# Patient Record
Sex: Male | Born: 1965
Health system: Southern US, Community
[De-identification: ages and names within clinical notes are randomized; demographics above are authoritative.]

## PROBLEM LIST (undated history)

## (undated) DIAGNOSIS — G43909 Migraine, unspecified, not intractable, without status migrainosus: Secondary | ICD-10-CM

## (undated) DIAGNOSIS — M419 Scoliosis, unspecified: Secondary | ICD-10-CM

## (undated) DIAGNOSIS — K861 Other chronic pancreatitis: Secondary | ICD-10-CM

## (undated) DIAGNOSIS — C801 Malignant (primary) neoplasm, unspecified: Secondary | ICD-10-CM

## (undated) DIAGNOSIS — D137 Benign neoplasm of endocrine pancreas: Secondary | ICD-10-CM

## (undated) DIAGNOSIS — L97502 Non-pressure chronic ulcer of other part of unspecified foot with fat layer exposed: Secondary | ICD-10-CM

## (undated) DIAGNOSIS — J189 Pneumonia, unspecified organism: Secondary | ICD-10-CM

## (undated) DIAGNOSIS — Q453 Other congenital malformations of pancreas and pancreatic duct: Secondary | ICD-10-CM

## (undated) DIAGNOSIS — I10 Essential (primary) hypertension: Secondary | ICD-10-CM

## (undated) HISTORY — PX: KNEE ARTHROSCOPY: SHX127

## (undated) HISTORY — DX: Non-pressure chronic ulcer of other part of unspecified foot with fat layer exposed: L97.502

## (undated) HISTORY — PX: BACK SURGERY: SHX140

## (undated) HISTORY — PX: FRACTURE SURGERY: SHX138

## (undated) HISTORY — DX: Essential (primary) hypertension: I10

---

## 1989-05-30 HISTORY — PX: LUMBAR DISC SURGERY: SHX700

## 1993-09-29 HISTORY — PX: FINGER FRACTURE SURGERY: SHX638

## 2002-12-14 ENCOUNTER — Emergency Department (HOSPITAL_COMMUNITY): Admission: EM | Admit: 2002-12-14 | Discharge: 2002-12-14 | Payer: Self-pay

## 2003-12-25 ENCOUNTER — Emergency Department (HOSPITAL_COMMUNITY): Admission: AC | Admit: 2003-12-25 | Discharge: 2003-12-25 | Payer: Self-pay

## 2004-01-02 ENCOUNTER — Emergency Department (HOSPITAL_COMMUNITY): Admission: EM | Admit: 2004-01-02 | Discharge: 2004-01-02 | Payer: Self-pay | Admitting: Family Medicine

## 2011-06-08 ENCOUNTER — Emergency Department (HOSPITAL_COMMUNITY)
Admission: EM | Admit: 2011-06-08 | Discharge: 2011-06-08 | Discharge status: Against Medical Advice | Payer: Self-pay | Attending: Emergency Medicine | Admitting: Emergency Medicine

## 2011-06-08 DIAGNOSIS — IMO0002 Reserved for concepts with insufficient information to code with codable children: Secondary | ICD-10-CM | POA: Insufficient documentation

## 2011-06-08 DIAGNOSIS — R221 Localized swelling, mass and lump, neck: Secondary | ICD-10-CM | POA: Insufficient documentation

## 2011-06-08 DIAGNOSIS — R220 Localized swelling, mass and lump, head: Secondary | ICD-10-CM | POA: Insufficient documentation

## 2011-06-08 DIAGNOSIS — H5789 Other specified disorders of eye and adnexa: Secondary | ICD-10-CM | POA: Insufficient documentation

## 2011-06-09 ENCOUNTER — Emergency Department (HOSPITAL_COMMUNITY)
Admission: EM | Admit: 2011-06-09 | Discharge: 2011-06-09 | Disposition: A | Discharge status: Home / Self Care | Payer: Self-pay | Attending: Emergency Medicine | Admitting: Emergency Medicine

## 2011-06-09 DIAGNOSIS — H02409 Unspecified ptosis of unspecified eyelid: Secondary | ICD-10-CM | POA: Insufficient documentation

## 2013-09-18 ENCOUNTER — Encounter (HOSPITAL_COMMUNITY): Payer: Self-pay | Admitting: Emergency Medicine

## 2013-09-18 ENCOUNTER — Emergency Department (HOSPITAL_COMMUNITY): Payer: Self-pay

## 2013-09-18 ENCOUNTER — Emergency Department (HOSPITAL_COMMUNITY)
Admission: EM | Admit: 2013-09-18 | Discharge: 2013-09-18 | Disposition: A | Discharge status: Home / Self Care | Payer: Self-pay | Attending: Emergency Medicine | Admitting: Emergency Medicine

## 2013-09-18 DIAGNOSIS — R1032 Left lower quadrant pain: Secondary | ICD-10-CM | POA: Insufficient documentation

## 2013-09-18 DIAGNOSIS — R109 Unspecified abdominal pain: Secondary | ICD-10-CM

## 2013-09-18 DIAGNOSIS — Z8739 Personal history of other diseases of the musculoskeletal system and connective tissue: Secondary | ICD-10-CM | POA: Insufficient documentation

## 2013-09-18 DIAGNOSIS — K5900 Constipation, unspecified: Secondary | ICD-10-CM | POA: Insufficient documentation

## 2013-09-18 DIAGNOSIS — N400 Enlarged prostate without lower urinary tract symptoms: Secondary | ICD-10-CM | POA: Insufficient documentation

## 2013-09-18 DIAGNOSIS — R1904 Left lower quadrant abdominal swelling, mass and lump: Secondary | ICD-10-CM | POA: Insufficient documentation

## 2013-09-18 DIAGNOSIS — F17200 Nicotine dependence, unspecified, uncomplicated: Secondary | ICD-10-CM | POA: Insufficient documentation

## 2013-09-18 DIAGNOSIS — R1900 Intra-abdominal and pelvic swelling, mass and lump, unspecified site: Secondary | ICD-10-CM

## 2013-09-18 HISTORY — DX: Scoliosis, unspecified: M41.9

## 2013-09-18 LAB — CBC WITH DIFFERENTIAL/PLATELET
Basophils Absolute: 0 10*3/uL (ref 0.0–0.1)
Basophils Relative: 0 % (ref 0–1)
Eosinophils Absolute: 0 10*3/uL (ref 0.0–0.7)
Eosinophils Relative: 0 % (ref 0–5)
HCT: 51.2 % (ref 39.0–52.0)
Hemoglobin: 17.7 g/dL — ABNORMAL HIGH (ref 13.0–17.0)
Lymphocytes Relative: 21 % (ref 12–46)
Lymphs Abs: 2.5 10*3/uL (ref 0.7–4.0)
MCH: 30.6 pg (ref 26.0–34.0)
MCHC: 34.6 g/dL (ref 30.0–36.0)
MCV: 88.4 fL (ref 78.0–100.0)
Monocytes Absolute: 0.6 10*3/uL (ref 0.1–1.0)
Monocytes Relative: 5 % (ref 3–12)
Neutro Abs: 9 10*3/uL — ABNORMAL HIGH (ref 1.7–7.7)
Neutrophils Relative %: 74 % (ref 43–77)
Platelets: 232 10*3/uL (ref 150–400)
RBC: 5.79 MIL/uL (ref 4.22–5.81)
RDW: 13.8 % (ref 11.5–15.5)
WBC: 12.2 10*3/uL — ABNORMAL HIGH (ref 4.0–10.5)

## 2013-09-18 LAB — COMPREHENSIVE METABOLIC PANEL
ALT: 14 U/L (ref 0–53)
AST: 21 U/L (ref 0–37)
Albumin: 4.5 g/dL (ref 3.5–5.2)
Alkaline Phosphatase: 119 U/L — ABNORMAL HIGH (ref 39–117)
BUN: 8 mg/dL (ref 6–23)
CO2: 22 mEq/L (ref 19–32)
Calcium: 9.7 mg/dL (ref 8.4–10.5)
Chloride: 100 mEq/L (ref 96–112)
Creatinine, Ser: 0.93 mg/dL (ref 0.50–1.35)
GFR calc Af Amer: >90 mL/min (ref >90)
GFR calc non Af Amer: >90 mL/min (ref >90)
Glucose, Bld: 89 mg/dL (ref 70–99)
Potassium: 4.2 mEq/L (ref 3.5–5.1)
Sodium: 136 mEq/L (ref 135–145)
Total Bilirubin: 0.4 mg/dL (ref 0.3–1.2)
Total Protein: 8 g/dL (ref 6.0–8.3)

## 2013-09-18 LAB — URINALYSIS, ROUTINE W REFLEX MICROSCOPIC
Bilirubin Urine: NEGATIVE
Glucose, UA: NEGATIVE mg/dL
Hgb urine dipstick: NEGATIVE
Ketones, ur: 15 mg/dL — AB
Leukocytes, UA: NEGATIVE
Nitrite: NEGATIVE
Protein, ur: NEGATIVE mg/dL
Specific Gravity, Urine: 1.014 (ref 1.005–1.030)
Urobilinogen, UA: 0.2 mg/dL (ref 0.0–1.0)
pH: 5 (ref 5.0–8.0)

## 2013-09-18 LAB — LIPASE, BLOOD: Lipase: 25 U/L (ref 11–59)

## 2013-09-18 MED ORDER — OXYCODONE-ACETAMINOPHEN 5-325 MG PO TABS: 1.0000 | ORAL_TABLET | Freq: Four times a day (QID) | ORAL | Status: DC | PRN

## 2013-09-18 MED ORDER — IOHEXOL 300 MG/ML  SOLN
100.0000 mL | Freq: Once | INTRAMUSCULAR | Status: AC | PRN
  Administered 2013-09-18: 100 mL via INTRAVENOUS

## 2013-09-18 NOTE — ED Notes (Signed)
Patient transported to CT 

## 2013-09-18 NOTE — ED Notes (Addendum)
Pt c/o LLQ pain that radiates to his back x 3 months.  He came today b/c the pain is unbearable.  C/o increasing diarrhea (6 x's today).  Denies nausea.  Pt also c/o increasing urinary frequency and weight loss.

## 2013-09-18 NOTE — ED Provider Notes (Signed)
CSN: [id number]     Arrival date & time 09/18/13  1216 History   First MD Initiated Contact with Patient 09/18/13 1459     Chief Complaint  Patient presents with  . Abdominal Pain   (Consider location/radiation/quality/duration/timing/severity/associated sxs/prior Treatment) Patient is a 47 y.o. male presenting with abdominal pain. The history is provided by the patient.  Abdominal Pain Pain location:  LLQ Pain quality: sharp   Pain radiation: lower back. Pain severity:  Moderate Onset quality:  Sudden Duration:  12 weeks Timing:  Intermittent (intermittent throughout the day, having daily pain) Progression:  Worsening Chronicity:  New Context: not diet changes, not eating, not recent travel, not sick contacts and not trauma   Relieved by:  Nothing Worsened by:  Nothing tried Associated symptoms: constipation   Associated symptoms: no cough, no fever, no nausea, no shortness of breath and no vomiting     Past Medical History  Diagnosis Date  . Scoliosis    Past Surgical History  Procedure Laterality Date  . Back surgery     No family history on file. History  Substance Use Topics  . Smoking status: Current Every Day Smoker -- 0.25 packs/day    Types: Cigarettes  . Smokeless tobacco: Not on file  . Alcohol Use: Yes     Comment: occ    Review of Systems  Constitutional: Negative for fever.  Respiratory: Negative for cough and shortness of breath.   Gastrointestinal: Positive for abdominal pain and constipation. Negative for nausea and vomiting.  All other systems reviewed and are negative.    Allergies  Review of patient's allergies indicates no known allergies.  Home Medications   Current Outpatient Rx  Name  Route  Sig  Dispense  Refill  . bismuth subsalicylate (PEPTO BISMOL) 262 MG chewable tablet   Oral   Chew 524 mg by mouth as needed for indigestion or diarrhea or loose stools.          BP 113/72  Pulse 75  Temp(Src) 98.6 F (37 C) (Oral)   Resp 20  Wt 161 lb 3.2 oz (73.12 kg)  SpO2 98% Physical Exam  Nursing note and vitals reviewed. Constitutional: He is oriented to person, place, and time. He appears well-developed and well-nourished. No distress.  HENT:  Head: Normocephalic and atraumatic.  Mouth/Throat: No oropharyngeal exudate.  Eyes: EOM are normal. Pupils are equal, round, and reactive to light.  Neck: Normal range of motion. Neck supple.  Cardiovascular: Normal rate and regular rhythm.  Exam reveals no friction rub.   No murmur heard. Pulmonary/Chest: Effort normal and breath sounds normal. No respiratory distress. He has no wheezes. He has no rales.  Abdominal: He exhibits no distension. There is tenderness (LLQ). There is guarding (LLQ). There is no rebound.  Genitourinary: Penis normal. Prostate is enlarged. Prostate is not tender (no boggyness). Right testis shows no mass, no swelling and no tenderness. Left testis shows no mass, no swelling and no tenderness.  Musculoskeletal: Normal range of motion. He exhibits no edema.  Neurological: He is alert and oriented to person, place, and time.  Skin: He is not diaphoretic.    ED Course  Procedures (including critical care time) Labs Review Labs Reviewed  CBC WITH DIFFERENTIAL - Abnormal; Notable for the following:    WBC 12.2 (*)    Hemoglobin 17.7 (*)    Neutro Abs 9.0 (*)    All other components within normal limits  COMPREHENSIVE METABOLIC PANEL - Abnormal; Notable for the following:  Alkaline Phosphatase 119 (*)    All other components within normal limits  LIPASE, BLOOD  URINALYSIS, ROUTINE W REFLEX MICROSCOPIC   Imaging Review Ct Abdomen Pelvis W Contrast  09/18/2013   CLINICAL DATA:  Left lower quadrant pain.  Diarrhea.  EXAM: CT ABDOMEN AND PELVIS WITH CONTRAST  TECHNIQUE: Multidetector CT imaging of the abdomen and pelvis was performed using the standard protocol following bolus administration of intravenous contrast.  CONTRAST:   OMNIPAQUE IOHEXOL 300 MG/ML  SOLN  COMPARISON:  None.  FINDINGS: Liver normal. Spleen normal. Spleen normal. Noted along the descending portion the duodenum in the region of pancreatic head is a ill-defined 4.6 x 2.1 cm mass, representative image number 18/series 6. This is worrisome for pancreatic carcinoma or a duodenal malignancy. Peptic ulcer disease with swelling could also present in this fashion. Endoscopic evaluation is suggested. Portal vein and splenic vein appear patent. Celiac, SMA, and IMA are patent. Mesenteric veins are patent. Hepatic veins are patent. No definite periportal or peripancreatic adenopathy noted.  Adrenals normal. Simple renal cyst . No hydronephrosis or obstructing ureteral stone. Bladder is nondistended. Phleboliths. Mild prostate enlargement. No free pelvic fluid.  No significant inguinal or retroperitoneal adenopathy. Aorta is widely patent.  There is no evidence of bowel obstruction. No inflammatory changes are in the right or left lower quadrant. What appears to be the appendix is normal. No gastric distention noted. Distal esophagus is unremarkable.  Abdominal wall is intact. No hernia. Heart size normal. No acute bony abnormality identified. Degenerative changes lumbar spine.  IMPRESSION: Large mass involving the duodenum and/or pancreatic head. This is worrisome for primary duodenal or pancreatic malignancy. Endoscopic evaluation is suggest for further evaluation. Endoscopic ultrasound directed biopsy may prove useful.   Electronically Signed   By: Maisie Fus  Register   On: 09/18/2013 16:45    EKG Interpretation   None       MDM   1. Abdominal mass   2. Abdominal pain    89M presents with LLQ pain for 3 months. Daily, sharp, radiating to lower back. No N/V/D. No hematuria, penile pain, testicular pain. No fevers. Does have urinary frequency, stating small amount of urine each time he urinates. No hx of diverticulitis.  AFVSS. On exam, LLQ pain with guarding.  Enlarged, nontender prostate. No prostate bogginess. Normal GU exam, no hernias or testicular pain.  Labs with mild white count. Will CT scan.  CT shows possible pancreatic mass vs PUD. I spoke with GI who stated patient can f/u as outpatient vs inpatient. Patient would like to f/u outpatient. Given instructions to f/u ASAP in case this is pancreatic cancer.   Dagmar Hait, MD 09/19/13 [phone]

## 2013-09-18 NOTE — ED Notes (Signed)
Patient discharged to home with family. NAD.  

## 2013-09-19 ENCOUNTER — Other Ambulatory Visit: Payer: Self-pay | Admitting: Gastroenterology

## 2013-09-20 ENCOUNTER — Encounter (HOSPITAL_COMMUNITY): Payer: Self-pay | Admitting: Emergency Medicine

## 2013-09-20 ENCOUNTER — Inpatient Hospital Stay (HOSPITAL_COMMUNITY)
Admission: EM | Admit: 2013-09-20 | Discharge: 2013-09-22 | DRG: 437 | Disposition: A | Discharge status: Home / Self Care | Payer: Self-pay | Attending: Internal Medicine | Admitting: Internal Medicine

## 2013-09-20 DIAGNOSIS — F17200 Nicotine dependence, unspecified, uncomplicated: Secondary | ICD-10-CM | POA: Diagnosis present

## 2013-09-20 DIAGNOSIS — M4120 Other idiopathic scoliosis, site unspecified: Secondary | ICD-10-CM | POA: Diagnosis present

## 2013-09-20 DIAGNOSIS — R1900 Intra-abdominal and pelvic swelling, mass and lump, unspecified site: Secondary | ICD-10-CM | POA: Diagnosis present

## 2013-09-20 DIAGNOSIS — R109 Unspecified abdominal pain: Secondary | ICD-10-CM | POA: Diagnosis present

## 2013-09-20 DIAGNOSIS — R112 Nausea with vomiting, unspecified: Secondary | ICD-10-CM | POA: Diagnosis present

## 2013-09-20 DIAGNOSIS — C250 Malignant neoplasm of head of pancreas: Principal | ICD-10-CM | POA: Diagnosis present

## 2013-09-20 LAB — CBC WITH DIFFERENTIAL/PLATELET
Basophils Absolute: 0 10*3/uL (ref 0.0–0.1)
Basophils Relative: 1 % (ref 0–1)
Eosinophils Absolute: 0.1 10*3/uL (ref 0.0–0.7)
Eosinophils Relative: 1 % (ref 0–5)
HCT: 48.3 % (ref 39.0–52.0)
Hemoglobin: 16.5 g/dL (ref 13.0–17.0)
Lymphocytes Relative: 30 % (ref 12–46)
Lymphs Abs: 2 10*3/uL (ref 0.7–4.0)
MCH: 30.1 pg (ref 26.0–34.0)
MCHC: 34.2 g/dL (ref 30.0–36.0)
MCV: 88 fL (ref 78.0–100.0)
Monocytes Absolute: 0.5 10*3/uL (ref 0.1–1.0)
Monocytes Relative: 8 % (ref 3–12)
Neutro Abs: 4 10*3/uL (ref 1.7–7.7)
Neutrophils Relative %: 61 % (ref 43–77)
Platelets: 216 10*3/uL (ref 150–400)
RBC: 5.49 MIL/uL (ref 4.22–5.81)
RDW: 13.8 % (ref 11.5–15.5)
WBC: 6.6 10*3/uL (ref 4.0–10.5)

## 2013-09-20 LAB — URINALYSIS, ROUTINE W REFLEX MICROSCOPIC
Bilirubin Urine: NEGATIVE
Glucose, UA: NEGATIVE mg/dL
Hgb urine dipstick: NEGATIVE
Ketones, ur: 15 mg/dL — AB
Leukocytes, UA: NEGATIVE
Nitrite: NEGATIVE
Protein, ur: NEGATIVE mg/dL
Specific Gravity, Urine: 1.02 (ref 1.005–1.030)
Urobilinogen, UA: 1 mg/dL (ref 0.0–1.0)
pH: 5.5 (ref 5.0–8.0)

## 2013-09-20 LAB — COMPREHENSIVE METABOLIC PANEL
ALT: 12 U/L (ref 0–53)
AST: 19 U/L (ref 0–37)
Albumin: 4.1 g/dL (ref 3.5–5.2)
Alkaline Phosphatase: 101 U/L (ref 39–117)
BUN: 5 mg/dL — ABNORMAL LOW (ref 6–23)
CO2: 27 mEq/L (ref 19–32)
Calcium: 9.4 mg/dL (ref 8.4–10.5)
Chloride: 103 mEq/L (ref 96–112)
Creatinine, Ser: 0.89 mg/dL (ref 0.50–1.35)
GFR calc Af Amer: >90 mL/min (ref >90)
GFR calc non Af Amer: >90 mL/min (ref >90)
Glucose, Bld: 96 mg/dL (ref 70–99)
Potassium: 4.6 mEq/L (ref 3.5–5.1)
Sodium: 142 mEq/L (ref 135–145)
Total Bilirubin: 0.3 mg/dL (ref 0.3–1.2)
Total Protein: 7.5 g/dL (ref 6.0–8.3)

## 2013-09-20 LAB — TROPONIN I: Troponin I: <0.3 ng/mL (ref <0.30)

## 2013-09-20 LAB — LIPASE, BLOOD: Lipase: 26 U/L (ref 11–59)

## 2013-09-20 MED ORDER — HYDROMORPHONE HCL PF 1 MG/ML IJ SOLN
1.0000 mg | INTRAMUSCULAR | Status: AC | PRN
  Administered 2013-09-20: 1 mg via INTRAVENOUS
  Filled 2013-09-20: qty 1

## 2013-09-20 MED ORDER — ENOXAPARIN SODIUM 40 MG/0.4ML ~~LOC~~ SOLN
40.0000 mg | SUBCUTANEOUS | Status: DC
  Administered 2013-09-21: 40 mg via SUBCUTANEOUS
  Filled 2013-09-20 (×2): qty 0.4

## 2013-09-20 MED ORDER — ACETAMINOPHEN 650 MG RE SUPP: 650.0000 mg | Freq: Four times a day (QID) | RECTAL | Status: DC | PRN

## 2013-09-20 MED ORDER — ONDANSETRON HCL 4 MG/2ML IJ SOLN
4.0000 mg | Freq: Three times a day (TID) | INTRAMUSCULAR | Status: AC | PRN
  Administered 2013-09-20: 4 mg via INTRAVENOUS
  Filled 2013-09-20: qty 2

## 2013-09-20 MED ORDER — SODIUM CHLORIDE 0.9 % IV BOLUS (SEPSIS)
1000.0000 mL | Freq: Once | INTRAVENOUS | Status: AC
  Administered 2013-09-20: 1000 mL via INTRAVENOUS

## 2013-09-20 MED ORDER — MORPHINE SULFATE 4 MG/ML IJ SOLN
4.0000 mg | Freq: Once | INTRAMUSCULAR | Status: AC
  Administered 2013-09-20: 4 mg via INTRAVENOUS
  Filled 2013-09-20: qty 1

## 2013-09-20 MED ORDER — ONDANSETRON HCL 4 MG/2ML IJ SOLN
4.0000 mg | Freq: Once | INTRAMUSCULAR | Status: AC
  Administered 2013-09-20: 4 mg via INTRAVENOUS
  Filled 2013-09-20: qty 2

## 2013-09-20 MED ORDER — SODIUM CHLORIDE 0.9 % IV SOLN
INTRAVENOUS | Status: DC
  Administered 2013-09-20: 20 mL/h via INTRAVENOUS

## 2013-09-20 MED ORDER — SODIUM CHLORIDE 0.9 % IV SOLN
INTRAVENOUS | Status: DC
  Administered 2013-09-20 – 2013-09-21 (×2): via INTRAVENOUS

## 2013-09-20 MED ORDER — ACETAMINOPHEN 325 MG PO TABS
650.0000 mg | ORAL_TABLET | Freq: Four times a day (QID) | ORAL | Status: DC | PRN
  Administered 2013-09-21 – 2013-09-22 (×2): 650 mg via ORAL
  Filled 2013-09-20 (×2): qty 2

## 2013-09-20 NOTE — ED Notes (Signed)
Attempted to call report. RN will call back. 

## 2013-09-20 NOTE — ED Notes (Signed)
Report called 6N RN

## 2013-09-20 NOTE — H&P (Signed)
[facility] History and Physical  Brent Rogers ZOX:[id number] DOB: 07/05/1966 DOA: 09/20/2013  Referring physician:  PCP: No PCP Per Patient  Specialists:   Chief Complaint: abdominal pain, nausea, vomiting   HPI: Brent Rogers is a 47 y.o. male smoker without significant medical history presented with LLQ pain for 3 months, associated with nausea, vomiting and weight loss > 30lb; patient was seen in ED on 12/12 underwent CT abdomen which showed Large mass involving the duodenum and/or pancreatic head then patient was d/c to f/u with GI; however his symptoms did not improve continued with abdominal pain, nausea vomiting and he came back to ED; Denies fever, no SOB, no chest pain, no focal neuro symptoms   Review of Systems: The patient denies anorexia, fever,  vision loss, decreased hearing, hoarseness, chest pain, syncope, dyspnea on exertion, peripheral edema, balance deficits, hemoptysis, abdominal pain, melena, hematochezia, s hematuria, incontinence, genital sores, muscle weakness, suspicious skin lesions, transient blindness, difficulty walking, depression, abnormal bleeding, enlarged lymph nodes, angioedema, and breast masses.    Past Medical History  Diagnosis Date  . Scoliosis    Past Surgical History  Procedure Laterality Date  . Back surgery     Social History:  reports that he has been smoking Cigarettes.  He has been smoking about 0.25 packs per day. He does not have any smokeless tobacco history on file. He reports that he drinks alcohol. He reports that he does not use illicit drugs. Home;  where does patient live--home, ALF, SNF? and with whom if at home? Yes;  Can patient participate in ADLs?  No Known Allergies  History reviewed. No pertinent family history. h/o breast CA (be sure to complete)  Prior to Admission medications   Medication Sig Start Date End Date Taking? Authorizing Provider  bismuth subsalicylate (PEPTO BISMOL) 262 MG chewable tablet  Chew 524 mg by mouth as needed for indigestion or diarrhea or loose stools.   Yes Historical Provider, MD   Physical Exam: Filed Vitals:   09/20/13 0949  BP: 119/92  Pulse: 94  Temp: 99.2 F (37.3 C)  Resp: 18     General:  alert  Eyes: eom-i, perrla   ENT: no oral ulcers   Neck: supple   Cardiovascular: s1,s2 rrr  Respiratory: CTA BL  Abdomen: soft, nt, nd   Skin: no oral rash   Musculoskeletal: no LE edema   Psychiatric: no hallucinations   Neurologic: CN 2-12 intact   Labs on Admission:  Basic Metabolic Panel:  Recent Labs Lab 09/18/13 1237 09/20/13 1024  NA 136 142  K 4.2 4.6  CL 100 103  CO2 22 27  GLUCOSE 89 96  BUN 8 5*  CREATININE 0.93 0.89  CALCIUM 9.7 9.4   Liver Function Tests:  Recent Labs Lab 09/18/13 1237 09/20/13 1024  AST 21 19  ALT 14 12  ALKPHOS 119* 101  BILITOT 0.4 0.3  PROT 8.0 7.5  ALBUMIN 4.5 4.1    Recent Labs Lab 09/18/13 1237 09/20/13 1024  LIPASE 25 26   No results found for this basename: AMMONIA,  in the last 168 hours CBC:  Recent Labs Lab 09/18/13 1237 09/20/13 1024  WBC 12.2* 6.6  NEUTROABS 9.0* 4.0  HGB 17.7* 16.5  HCT 51.2 48.3  MCV 88.4 88.0  PLT 232 216   Cardiac Enzymes:  Recent Labs Lab 09/20/13 1025  TROPONINI <0.30    BNP (last 3 results) No results found for this basename: PROBNP,  in the last 8760  hours CBG: No results found for this basename: GLUCAP,  in the last 168 hours  Radiological Exams on Admission: Ct Abdomen Pelvis W Contrast  09/18/2013   CLINICAL DATA:  Left lower quadrant pain.  Diarrhea.  EXAM: CT ABDOMEN AND PELVIS WITH CONTRAST  TECHNIQUE: Multidetector CT imaging of the abdomen and pelvis was performed using the standard protocol following bolus administration of intravenous contrast.  CONTRAST:  OMNIPAQUE IOHEXOL 300 MG/ML  SOLN  COMPARISON:  None.  FINDINGS: Liver normal. Spleen normal. Spleen normal. Noted along the descending portion the duodenum in  the region of pancreatic head is a ill-defined 4.6 x 2.1 cm mass, representative image number 18/series 6. This is worrisome for pancreatic carcinoma or a duodenal malignancy. Peptic ulcer disease with swelling could also present in this fashion. Endoscopic evaluation is suggested. Portal vein and splenic vein appear patent. Celiac, SMA, and IMA are patent. Mesenteric veins are patent. Hepatic veins are patent. No definite periportal or peripancreatic adenopathy noted.  Adrenals normal. Simple renal cyst . No hydronephrosis or obstructing ureteral stone. Bladder is nondistended. Phleboliths. Mild prostate enlargement. No free pelvic fluid.  No significant inguinal or retroperitoneal adenopathy. Aorta is widely patent.  There is no evidence of bowel obstruction. No inflammatory changes are in the right or left lower quadrant. What appears to be the appendix is normal. No gastric distention noted. Distal esophagus is unremarkable.  Abdominal wall is intact. No hernia. Heart size normal. No acute bony abnormality identified. Degenerative changes lumbar spine.  IMPRESSION: Large mass involving the duodenum and/or pancreatic head. This is worrisome for primary duodenal or pancreatic malignancy. Endoscopic evaluation is suggest for further evaluation. Endoscopic ultrasound directed biopsy may prove useful.   Electronically Signed   By: Maisie Fus  Register   On: 09/18/2013 16:45    EKG: Independently reviewed. Not done   Assessment/Plan Principal Problem:   Nausea & vomiting Active Problems:   Abdominal mass   Abdominal pain  47 y.o. male smoker without significant medical history presented with LLQ pain for 3 months, associated with nausea, vomiting and weight loss > 30 lb found to have pancreatic mass  1. Abdominal pain, nausea, vomiting likely related to underlying pancreatic mass; exam no s/s of acute abdomen; -IVF, antiemetics, control pain;   2. 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 This is worrisome for primary duodenal or pancreatic malignancy -NPO, c/s GI if could perform EUS biopsy;    GI;  if consultant consulted, please document name and whether formally or informally consulted  Code Status: full (must indicate code status--if unknown or must be presumed, indicate so) Family Communication: d/w patient  (indicate person spoken with, if applicable, with phone number if by telephone) Disposition Plan: home in 24-48 hours  (indicate anticipated LOS)  Time spent: >35 minutes   Esperanza Sheets [facility] Pager [phone]  If 7PM-7AM, please contact night-coverage [email or web address] Password [email or web address] 09/20/2013, 1:17 PM

## 2013-09-20 NOTE — ED Provider Notes (Signed)
CSN: [id number]     Arrival date & time 09/20/13  4098 History   First MD Initiated Contact with Patient 09/20/13 1007     Chief Complaint  Patient presents with  . Abdominal Pain   (Consider location/radiation/quality/duration/timing/severity/associated sxs/prior Treatment) Patient is a 47 y.o. male presenting with abdominal pain. The history is provided by the patient and medical records.  Abdominal Pain  This is a 47 year old male with past medical history significant for scoliosis, presenting to the ED for left lower quadrant and epigastric abdominal pain intermittently for the past 3 months. Patient was seen in the ED over the weekend and diagnosed with pancreatic mass. Patient elected to undergo outpatient workup, has called for an appointment was not available until end of January.  Patient has continued to have daily nausea and vomiting at home despite treatment with Zofran. He has been unable to tolerate minimal amounts of water, but no solid foods. Patient states pain is unchanged, but notes there is some intermittent radiation into the left side of chest.  No associated SOB, palpitations, dizziness, weakness, or diaphoresis.  No prior cardiac hx.  Pt is a daily smoker.  Additionally patient notes a 20-30 pound weight loss over the past 3 months. Prior family history of cancer-- maternal, thinks it was breast CA.  Pt has never had a colonoscopy.  Denies any fevers, sweats, or chills. Admits to frequent urination but denies any dysuria, hematuria, or urethral discharge.  Past Medical History  Diagnosis Date  . Scoliosis    Past Surgical History  Procedure Laterality Date  . Back surgery     History reviewed. No pertinent family history. History  Substance Use Topics  . Smoking status: Current Every Day Smoker -- 0.25 packs/day    Types: Cigarettes  . Smokeless tobacco: Not on file  . Alcohol Use: Yes     Comment: occ    Review of Systems  Gastrointestinal: Positive for  abdominal pain.  All other systems reviewed and are negative.    Allergies  Review of patient's allergies indicates no known allergies.  Home Medications   Current Outpatient Rx  Name  Route  Sig  Dispense  Refill  . bismuth subsalicylate (PEPTO BISMOL) 262 MG chewable tablet   Oral   Chew 524 mg by mouth as needed for indigestion or diarrhea or loose stools.          BP 119/92  Pulse 94  Temp(Src) 99.2 F (37.3 C) (Oral)  Resp 18  SpO2 100%  Physical Exam  Nursing note and vitals reviewed. Constitutional: He is oriented to person, place, and time. He appears well-developed and well-nourished.  HENT:  Head: Normocephalic and atraumatic.  Mouth/Throat: Oropharynx is clear and moist.  Eyes: Conjunctivae and EOM are normal. Pupils are equal, round, and reactive to light.  Neck: Normal range of motion.  Cardiovascular: Normal rate, regular rhythm and normal heart sounds.   Pulmonary/Chest: Effort normal and breath sounds normal.  Abdominal: Soft. Bowel sounds are normal. There is tenderness in the epigastric area and left lower quadrant.  Abdomen soft, nondistended, TTP LLQ and epigastric regions  Musculoskeletal: Normal range of motion.  Neurological: He is alert and oriented to person, place, and time.  Skin: Skin is warm and dry.  Psychiatric: He has a normal mood and affect.    ED Course  Procedures (including critical care time)  Labs Review Labs Reviewed  COMPREHENSIVE METABOLIC PANEL - Abnormal; Notable for the following:    BUN 5 (*)  All other components within normal limits  CBC WITH DIFFERENTIAL  LIPASE, BLOOD  TROPONIN I  URINALYSIS, ROUTINE W REFLEX MICROSCOPIC   Imaging Review Ct Abdomen Pelvis W Contrast  09/18/2013   CLINICAL DATA:  Left lower quadrant pain.  Diarrhea.  EXAM: CT ABDOMEN AND PELVIS WITH CONTRAST  TECHNIQUE: Multidetector CT imaging of the abdomen and pelvis was performed using the standard protocol following bolus  administration of intravenous contrast.  CONTRAST:  OMNIPAQUE IOHEXOL 300 MG/ML  SOLN  COMPARISON:  None.  FINDINGS: Liver normal. Spleen normal. Spleen normal. Noted along the descending portion the duodenum in the region of pancreatic head is a ill-defined 4.6 x 2.1 cm mass, representative image number 18/series 6. This is worrisome for pancreatic carcinoma or a duodenal malignancy. Peptic ulcer disease with swelling could also present in this fashion. Endoscopic evaluation is suggested. Portal vein and splenic vein appear patent. Celiac, SMA, and IMA are patent. Mesenteric veins are patent. Hepatic veins are patent. No definite periportal or peripancreatic adenopathy noted.  Adrenals normal. Simple renal cyst . No hydronephrosis or obstructing ureteral stone. Bladder is nondistended. Phleboliths. Mild prostate enlargement. No free pelvic fluid.  No significant inguinal or retroperitoneal adenopathy. Aorta is widely patent.  There is no evidence of bowel obstruction. No inflammatory changes are in the right or left lower quadrant. What appears to be the appendix is normal. No gastric distention noted. Distal esophagus is unremarkable.  Abdominal wall is intact. No hernia. Heart size normal. No acute bony abnormality identified. Degenerative changes lumbar spine.  IMPRESSION: Large mass involving the duodenum and/or pancreatic head. This is worrisome for primary duodenal or pancreatic malignancy. Endoscopic evaluation is suggest for further evaluation. Endoscopic ultrasound directed biopsy may prove useful.   Electronically Signed   By: Maisie Fus  Register   On: 09/18/2013 16:45    EKG Interpretation    Date/Time:  Tuesday September 20 2013 09:43:03 EST Ventricular Rate:  103 PR Interval:  122 QRS Duration: 94 QT Interval:  346 QTC Calculation: 453 R Axis:   70 Text Interpretation:  Sinus tachycardia Right atrial enlargement Pulmonary disease pattern Abnormal ECG No old tracing to compare Confirmed by  GOLDSTON  MD, SCOTT ([id number]) on 09/20/2013 12:25:09 PM            MDM   1. Abdominal mass   2. Nausea & vomiting    EKG NSR, no acute ischemic changes.  Labs as above, no significant electrolyte imbalance.  CT from 12/21 above-- large abdominal mass involving duodenum/pancreatic head.  Pt has failed OP tx for nausea, vomiting, abdominal pain, will require hospital admission for symptomatic control and further work-up.  Discussed with hospitalist, Dr. York Spaniel-- pt to be admitted for observation.  Temp admit orders placed.  VS stable for transport to floor.  Garlon Hatchet, PA-C 09/20/13 1356

## 2013-09-20 NOTE — ED Notes (Signed)
MD at bedside. 

## 2013-09-20 NOTE — ED Notes (Signed)
Pt c/o lower abd pain and pain in epigastric area intermittently x 3 months; pt seen here on Sunday and was diagnosed with pancreatic mass; pt sts some N/V

## 2013-09-20 NOTE — Consult Note (Signed)
Unassigned Patient  Reason for Consult: Pancreatic and/or duodenal mass Referring Physician: [facility]  Vonzella Nipple HPI: This is a 47 year old male with complaints of LLQ abdominal pain and weight loss over the past 3 months.  He states that he lost 30 lbs.  He was in the ER a couple of days ago and he was discharged home with an outpatient follow, however, his symptoms of nausea and vomiting continued to progress.  CT scan performed earlier revealed a large pancreatic head mass and/or duodenal mass.  As a result of the findings a GI consultation was requested for further evaluation.  Past Medical History  Diagnosis Date  . Scoliosis     Past Surgical History  Procedure Laterality Date  . Back surgery      History reviewed. No pertinent family history.  Social History:  reports that he has been smoking Cigarettes.  He has been smoking about 0.25 packs per day. He does not have any smokeless tobacco history on file. He reports that he drinks alcohol. He reports that he does not use illicit drugs.  Allergies: No Known Allergies  Medications: Scheduled:  Continuous:   Results for orders placed during the hospital encounter of 09/20/13 (from the past 24 hour(s))  CBC WITH DIFFERENTIAL     Status: None   Collection Time    09/20/13 10:24 AM      Result Value Range   WBC 6.6  4.0 - 10.5 K/uL   RBC 5.49  4.22 - 5.81 MIL/uL   Hemoglobin 16.5  13.0 - 17.0 g/dL   HCT 96.0  45.4 - 09.8 %   MCV 88.0  78.0 - 100.0 fL   MCH 30.1  26.0 - 34.0 pg   MCHC 34.2  30.0 - 36.0 g/dL   RDW 11.9  14.7 - 82.9 %   Platelets 216  150 - 400 K/uL   Neutrophils Relative % 61  43 - 77 %   Neutro Abs 4.0  1.7 - 7.7 K/uL   Lymphocytes Relative 30  12 - 46 %   Lymphs Abs 2.0  0.7 - 4.0 K/uL   Monocytes Relative 8  3 - 12 %   Monocytes Absolute 0.5  0.1 - 1.0 K/uL   Eosinophils Relative 1  0 - 5 %   Eosinophils Absolute 0.1  0.0 - 0.7 K/uL   Basophils Relative 1  0 - 1 %   Basophils  Absolute 0.0  0.0 - 0.1 K/uL  COMPREHENSIVE METABOLIC PANEL     Status: Abnormal   Collection Time    09/20/13 10:24 AM      Result Value Range   Sodium 142  135 - 145 mEq/L   Potassium 4.6  3.5 - 5.1 mEq/L   Chloride 103  96 - 112 mEq/L   CO2 27  19 - 32 mEq/L   Glucose, Bld 96  70 - 99 mg/dL   BUN 5 (*) 6 - 23 mg/dL   Creatinine, Ser 5.62  0.50 - 1.35 mg/dL   Calcium 9.4  8.4 - 13.0 mg/dL   Total Protein 7.5  6.0 - 8.3 g/dL   Albumin 4.1  3.5 - 5.2 g/dL   AST 19  0 - 37 U/L   ALT 12  0 - 53 U/L   Alkaline Phosphatase 101  39 - 117 U/L   Total Bilirubin 0.3  0.3 - 1.2 mg/dL   GFR calc non Af Amer >90  >90 mL/min   GFR  calc Af Amer >90  >90 mL/min  LIPASE, BLOOD     Status: None   Collection Time    09/20/13 10:24 AM      Result Value Range   Lipase 26  11 - 59 U/L  TROPONIN I     Status: None   Collection Time    09/20/13 10:25 AM      Result Value Range   Troponin I <0.30  <0.30 ng/mL  URINALYSIS, ROUTINE W REFLEX MICROSCOPIC     Status: Abnormal   Collection Time    09/20/13  1:53 PM      Result Value Range   Color, Urine YELLOW  YELLOW   APPearance CLOUDY (*) CLEAR   Specific Gravity, Urine 1.020  1.005 - 1.030   pH 5.5  5.0 - 8.0   Glucose, UA NEGATIVE  NEGATIVE mg/dL   Hgb urine dipstick NEGATIVE  NEGATIVE   Bilirubin Urine NEGATIVE  NEGATIVE   Ketones, ur 15 (*) NEGATIVE mg/dL   Protein, ur NEGATIVE  NEGATIVE mg/dL   Urobilinogen, UA 1.0  0.0 - 1.0 mg/dL   Nitrite NEGATIVE  NEGATIVE   Leukocytes, UA NEGATIVE  NEGATIVE     Ct Abdomen Pelvis W Contrast  09/18/2013   CLINICAL DATA:  Left lower quadrant pain.  Diarrhea.  EXAM: CT ABDOMEN AND PELVIS WITH CONTRAST  TECHNIQUE: Multidetector CT imaging of the abdomen and pelvis was performed using the standard protocol following bolus administration of intravenous contrast.  CONTRAST:  OMNIPAQUE IOHEXOL 300 MG/ML  SOLN  COMPARISON:  None.  FINDINGS: Liver normal. Spleen normal. Spleen normal. Noted along the  descending portion the duodenum in the region of pancreatic head is a ill-defined 4.6 x 2.1 cm mass, representative image number 18/series 6. This is worrisome for pancreatic carcinoma or a duodenal malignancy. Peptic ulcer disease with swelling could also present in this fashion. Endoscopic evaluation is suggested. Portal vein and splenic vein appear patent. Celiac, SMA, and IMA are patent. Mesenteric veins are patent. Hepatic veins are patent. No definite periportal or peripancreatic adenopathy noted.  Adrenals normal. Simple renal cyst . No hydronephrosis or obstructing ureteral stone. Bladder is nondistended. Phleboliths. Mild prostate enlargement. No free pelvic fluid.  No significant inguinal or retroperitoneal adenopathy. Aorta is widely patent.  There is no evidence of bowel obstruction. No inflammatory changes are in the right or left lower quadrant. What appears to be the appendix is normal. No gastric distention noted. Distal esophagus is unremarkable.  Abdominal wall is intact. No hernia. Heart size normal. No acute bony abnormality identified. Degenerative changes lumbar spine.  IMPRESSION: Large mass involving the duodenum and/or pancreatic head. This is worrisome for primary duodenal or pancreatic malignancy. Endoscopic evaluation is suggest for further evaluation. Endoscopic ultrasound directed biopsy may prove useful.   Electronically Signed   By: Maisie Fus  Register   On: 09/18/2013 16:45    ROS:  As stated above in the HPI otherwise negative.  Blood pressure 121/90, pulse 64, temperature 98.5 F (36.9 C), temperature source Oral, resp. rate 14, SpO2 99.00%.    PE: Gen: NAD, Alert and Oriented HEENT:  [place]/AT, EOMI Neck: Supple, no LAD Lungs: CTA Bilaterally CV: RRR without M/G/R ABM: Soft, NTND, +BS Ext: No C/C/E  Assessment/Plan: 1) Pancreatic head and/or duodenal mass.   Further evaluation with an EUS will be performed.  It may be that he has a duodenal mass and biopsies can be  obtained with a cold forceps.  I will make further determination  pending the findings tomorrow.  Plan: 1) EUS with FNA.  He will need to be Care Linked to [facility]  Oriyah Lamphear D 09/20/2013, 2:29 PM

## 2013-09-20 NOTE — ED Provider Notes (Signed)
Medical screening examination/treatment/procedure(s) were performed by non-physician practitioner and as supervising physician I was immediately available for consultation/collaboration.  EKG Interpretation    Date/Time:  Tuesday September 20 2013 09:43:03 EST Ventricular Rate:  103 PR Interval:  122 QRS Duration: 94 QT Interval:  346 QTC Calculation: 453 R Axis:   70 Text Interpretation:  Sinus tachycardia Right atrial enlargement Pulmonary disease pattern Abnormal ECG No old tracing to compare Confirmed by Tamatha Gadbois  MD, Freddie Dymek ([id number]) on 09/20/2013 12:25:09 PM               Audree Camel, MD 09/20/13 1946

## 2013-09-21 ENCOUNTER — Ambulatory Visit (HOSPITAL_COMMUNITY): Admit: 2013-09-21 | Payer: Self-pay | Admitting: Gastroenterology

## 2013-09-21 ENCOUNTER — Encounter (HOSPITAL_COMMUNITY)
Admission: EM | Disposition: A | Discharge status: Home / Self Care | Payer: Self-pay | Source: Home / Self Care | Attending: Internal Medicine

## 2013-09-21 ENCOUNTER — Encounter (HOSPITAL_COMMUNITY): Payer: Self-pay | Admitting: *Deleted

## 2013-09-21 ENCOUNTER — Encounter (HOSPITAL_COMMUNITY): Payer: Self-pay

## 2013-09-21 DIAGNOSIS — R109 Unspecified abdominal pain: Secondary | ICD-10-CM | POA: Diagnosis present

## 2013-09-21 HISTORY — PX: FINE NEEDLE ASPIRATION: SHX5430

## 2013-09-21 HISTORY — PX: EUS: SHX5427

## 2013-09-21 SURGERY — ESOPHAGEAL ENDOSCOPIC ULTRASOUND (EUS) RADIAL
Anesthesia: Moderate Sedation

## 2013-09-21 SURGERY — UPPER ENDOSCOPIC ULTRASOUND (EUS) LINEAR
Anesthesia: Moderate Sedation | Laterality: Left

## 2013-09-21 SURGERY — UPPER ENDOSCOPIC ULTRASOUND (EUS) LINEAR
Anesthesia: Moderate Sedation

## 2013-09-21 MED ORDER — DIPHENHYDRAMINE HCL 50 MG/ML IJ SOLN
INTRAMUSCULAR | Status: AC
  Filled 2013-09-21: qty 1

## 2013-09-21 MED ORDER — FENTANYL CITRATE 0.05 MG/ML IJ SOLN
INTRAMUSCULAR | Status: AC
  Filled 2013-09-21: qty 2

## 2013-09-21 MED ORDER — MIDAZOLAM HCL 10 MG/2ML IJ SOLN
INTRAMUSCULAR | Status: DC | PRN
  Administered 2013-09-21 (×4): 2 mg via INTRAVENOUS

## 2013-09-21 MED ORDER — BUTAMBEN-TETRACAINE-BENZOCAINE 2-2-14 % EX AERO
INHALATION_SPRAY | CUTANEOUS | Status: DC | PRN
  Administered 2013-09-21: 2 via TOPICAL

## 2013-09-21 MED ORDER — FENTANYL CITRATE 0.05 MG/ML IJ SOLN
INTRAMUSCULAR | Status: DC | PRN
  Administered 2013-09-21 (×4): 25 ug via INTRAVENOUS

## 2013-09-21 MED ORDER — PNEUMOCOCCAL VAC POLYVALENT 25 MCG/0.5ML IJ INJ
0.5000 mL | INJECTION | INTRAMUSCULAR | Status: AC
  Administered 2013-09-22: 0.5 mL via INTRAMUSCULAR
  Filled 2013-09-21: qty 0.5

## 2013-09-21 MED ORDER — MIDAZOLAM HCL 10 MG/2ML IJ SOLN
INTRAMUSCULAR | Status: AC
  Filled 2013-09-21: qty 2

## 2013-09-21 MED ORDER — SODIUM CHLORIDE 0.9 % IV SOLN: INTRAVENOUS | Status: DC

## 2013-09-21 MED ORDER — DIPHENHYDRAMINE HCL 50 MG/ML IJ SOLN
INTRAMUSCULAR | Status: DC | PRN
  Administered 2013-09-21: 25 mg via INTRAVENOUS

## 2013-09-21 NOTE — Progress Notes (Signed)
The preliminary result from the FNA reveals normal pancreas.  I reviewed the CT scan in detail with Radiology and they report that the mass has a cystic component.  In retrospect, it appears that the structure I labeled as the CBD was actually a cyst.  The examination was difficult to perform as a result of patient tolerance to the sedation.  I am anticipating that the final FNA result will show normal pancreas.  In this situation, I will repeat his EUS with Propofol.  Plan: 1) Advance to a regular diet. 2) If he is tolerating the diet he can be discharged home.   3) I will make arrangements for a repeat EUS to obtain samples from the cystic lesions. 

## 2013-09-21 NOTE — Op Note (Signed)
[facility] [address] [place], [address]   ENDOSCOPIC ULTRASOUND PROCEDURE REPORT  PATIENT: Brent, Rogers  MR#: [id number] BIRTHDATE: 09-13-1966  GENDER: Male ENDOSCOPIST: Jeani Hawking, MD REFERRED BY: PROCEDURE DATE:  09/21/2013 PROCEDURE:   Upper EUS w/FNA ASA CLASS:      Class II INDICATIONS:   Abnormal CT scan MEDICATIONS: Versed 10 mg IV and Fentanyl 100 mcg IV  DESCRIPTION OF PROCEDURE:   After the risks benefits and alternatives of the procedure were  explained, informed consent was obtained. The patient was then placed in the left, lateral, decubitus postion and IV sedation was administered. Throughout the procedure, the patients blood pressure, pulse and oxygen saturations were monitored continuously.  Under direct visualization, the     endoscope was introduced through the mouth and advanced to the second portion of the duodenum .  Water was used as necessary to provide an acoustic interface.  Upon completion of the imaging, water was removed and the patient was sent to the recovery room in satisfactory condition.   FINDINGS: Gross visualization of the duodenal bulb revealed a fullness with a mild extrinsic compression.  On EUS visualization I was not able to easily identify the mass lesion.  There was an ill-defined irregularity in the head of the pancreas and four passes witgh the 25 gauge FNA needle were made.  A fifth pass was not possible as a result of patient tolerance.  The CBD was noted to be enlarged at 12 mm.  Careful examination of the portal confluence was negative for any mass involvement.  The Celiac axis was also normal, i.e., no suspicious lymph nodes.   The scope was then withdrawn from the patient and the procedure completed.  COMPLICATIONS: There were no complications. ENDOSCOPIC VISUALIZATION:  ULTRASONIC VISUALIZATION:  STAGING:  ENDOSCOPIC IMPRESSION: 1) ? Pancreatic head mass. 2) Dilated CBD at 12  mm.  RECOMMENDATIONS: 1) Await FNA results.  _______________________________ eSignedJeani Hawking, MD 09/21/2013 1:42 PM   CC:  PATIENT NAME:  Brent Rogers, Brent Rogers MR#: [id number]

## 2013-09-21 NOTE — H&P (View-Only) (Signed)
[facility] PROGRESS NOTE  Brent Rogers JWJ:[id number] DOB: [birth year] DOA: 09/20/2013 PCP: No PCP Per Patient  Assessment/Plan: 47 y.o. male smoker without significant medical history presented with LLQ pain for 3 months, associated with nausea, vomiting and weight loss > 30 lb found to have pancreatic mass   1. Abdominal pain, nausea, vomiting likely related to underlying pancreatic mass; exam no s/s of acute abdomen;  -IVF, antiemetics, control pain;  2. 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 This is worrisome for primary duodenal or pancreatic malignancy  -NPO, c/sed GI EUS biopsy today   Code Status: full Family Communication: none at the bedside (indicate person spoken with, relationship, and if by phone, the number) Disposition Plan: home in 24-48 h ours    Consultants:  GI  Procedures:  EGD/EUS pend   Antibiotics:  None  (indicate start date, and stop date if known)  HPI/Subjective: alert  Objective: Filed Vitals:   09/21/13 0517  BP: 125/79  Pulse: 66  Temp: 97.7 F (36.5 C)  Resp: 18    Intake/Output Summary (Last 24 hours) at 09/21/13 0955 Last data filed at 09/21/13 0900  Gross per 24 hour  Intake 438.17 ml  Output      1 ml  Net 437.17 ml   Filed Weights   09/20/13 1516  Weight: 71.94 kg (158 lb 9.6 oz)    Exam:   General:  alert  Cardiovascular: s1,s2 rrr  Respiratory: CTA BL  Abdomen: soft, nt, nd   Musculoskeletal: no LE edema   Data Reviewed: Basic Metabolic Panel:  Recent Labs Lab 09/18/13 1237 09/20/13 1024  NA 136 142  K 4.2 4.6  CL 100 103  CO2 22 27  GLUCOSE 89 96  BUN 8 5*  CREATININE 0.93 0.89  CALCIUM 9.7 9.4   Liver Function Tests:  Recent Labs Lab 09/18/13 1237 09/20/13 1024  AST 21 19  ALT 14 12  ALKPHOS 119* 101  BILITOT 0.4 0.3  PROT 8.0 7.5  ALBUMIN 4.5 4.1    Recent Labs Lab 09/18/13 1237 09/20/13 1024  LIPASE 25 26    No results found for this basename: AMMONIA,  in the last 168 hours CBC:  Recent Labs Lab 09/18/13 1237 09/20/13 1024  WBC 12.2* 6.6  NEUTROABS 9.0* 4.0  HGB 17.7* 16.5  HCT 51.2 48.3  MCV 88.4 88.0  PLT 232 216   Cardiac Enzymes:  Recent Labs Lab 09/20/13 1025  TROPONINI <0.30   BNP (last 3 results) No results found for this basename: PROBNP,  in the last 8760 hours CBG: No results found for this basename: GLUCAP,  in the last 168 hours  No results found for this or any previous visit (from the past 240 hour(s)).   Studies: No results found.  Scheduled Meds: . enoxaparin (LOVENOX) injection  40 mg Subcutaneous Q24H   Continuous Infusions: . sodium chloride 75 mL/hr at 09/20/13 1642  . sodium chloride 20 mL/hr (09/20/13 2143)    Principal Problem:   Nausea & vomiting Active Problems:   Abdominal mass   Abdominal pain    Time spent: >35 minutes     Esperanza Sheets  [facility] Pager [phone]. If 7PM-7AM, please contact night-coverage at [email or web address], password [facility] 09/21/2013, 9:55 AM  LOS: 1 day

## 2013-09-21 NOTE — Interval H&P Note (Signed)
History and Physical Interval Note:  09/21/2013 12:21 PM  Brent Rogers  has presented today for surgery, with the diagnosis of pancreatic head mass  The various methods of treatment have been discussed with the patient and family. After consideration of risks, benefits and other options for treatment, the patient has consented to  Procedure(s): ESOPHAGEAL ENDOSCOPIC ULTRASOUND (EUS) RADIAL (N/A) FINE NEEDLE ASPIRATION (FNA) LINEAR (N/A) as a surgical intervention .  The patient's history has been reviewed, patient examined, no change in status, stable for surgery.  I have reviewed the patient's chart and labs.  Questions were answered to the patient's satisfaction.     Iridiana Fonner D

## 2013-09-21 NOTE — Progress Notes (Signed)
[facility] PROGRESS NOTE  [facility] MRN:[id number] DOB: 05/19/1966 DOA: 09/20/2013 PCP: No PCP Per Patient  Assessment/Plan: 47 y.o. male smoker without significant medical history presented with LLQ pain for 3 months, associated with nausea, vomiting and weight loss > 30 lb found to have pancreatic mass   1. Abdominal pain, nausea, vomiting likely related to underlying pancreatic mass; exam no s/s of acute abdomen;  -IVF, antiemetics, control pain;  2. 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 This is worrisome for primary duodenal or pancreatic malignancy  -NPO, c/sed GI EUS biopsy today   Code Status: full Family Communication: none at the bedside (indicate person spoken with, relationship, and if by phone, the number) Disposition Plan: home in 24-48 h ours    Consultants:  GI  Procedures:  EGD/EUS pend   Antibiotics:  None  (indicate start date, and stop date if known)  HPI/Subjective: alert  Objective: Filed Vitals:   09/21/13 0517  BP: 125/79  Pulse: 66  Temp: 97.7 F (36.5 C)  Resp: 18    Intake/Output Summary (Last 24 hours) at 09/21/13 0955 Last data filed at 09/21/13 0900  Gross per 24 hour  Intake 438.17 ml  Output      1 ml  Net 437.17 ml   Filed Weights   09/20/13 1516  Weight: 71.94 kg (158 lb 9.6 oz)    Exam:   General:  alert  Cardiovascular: s1,s2 rrr  Respiratory: CTA BL  Abdomen: soft, nt, nd   Musculoskeletal: no LE edema   Data Reviewed: Basic Metabolic Panel:  Recent Labs Lab 09/18/13 1237 09/20/13 1024  NA 136 142  K 4.2 4.6  CL 100 103  CO2 22 27  GLUCOSE 89 96  BUN 8 5*  CREATININE 0.93 0.89  CALCIUM 9.7 9.4   Liver Function Tests:  Recent Labs Lab 09/18/13 1237 09/20/13 1024  AST 21 19  ALT 14 12  ALKPHOS 119* 101  BILITOT 0.4 0.3  PROT 8.0 7.5  ALBUMIN 4.5 4.1    Recent Labs Lab 09/18/13 1237 09/20/13 1024  LIPASE 25 26    No results found for this basename: AMMONIA,  in the last 168 hours CBC:  Recent Labs Lab 09/18/13 1237 09/20/13 1024  WBC 12.2* 6.6  NEUTROABS 9.0* 4.0  HGB 17.7* 16.5  HCT 51.2 48.3  MCV 88.4 88.0  PLT 232 216   Cardiac Enzymes:  Recent Labs Lab 09/20/13 1025  TROPONINI <0.30   BNP (last 3 results) No results found for this basename: PROBNP,  in the last 8760 hours CBG: No results found for this basename: GLUCAP,  in the last 168 hours  No results found for this or any previous visit (from the past 240 hour(s)).   Studies: No results found.  Scheduled Meds: . enoxaparin (LOVENOX) injection  40 mg Subcutaneous Q24H   Continuous Infusions: . sodium chloride 75 mL/hr at 09/20/13 1642  . sodium chloride 20 mL/hr (09/20/13 2143)    Principal Problem:   Nausea & vomiting Active Problems:   Abdominal mass   Abdominal pain    Time spent: >35 minutes     Khaza Blansett N  [facility] Pager [phone]. If 7PM-7AM, please contact night-coverage at [email or web address], password [email or web address] 09/21/2013, 9:55 AM  LOS: 1 day              

## 2013-09-22 ENCOUNTER — Encounter (HOSPITAL_COMMUNITY): Payer: Self-pay | Admitting: Gastroenterology

## 2013-09-22 DIAGNOSIS — R109 Unspecified abdominal pain: Secondary | ICD-10-CM

## 2013-09-22 LAB — CANCER ANTIGEN 19-9: CA 19-9: 31 U/mL — ABNORMAL LOW (ref <35.0)

## 2013-09-22 MED ORDER — ACETAMINOPHEN 325 MG PO TABS: 650.0000 mg | ORAL_TABLET | Freq: Four times a day (QID) | ORAL | Status: DC | PRN

## 2013-09-22 MED ORDER — ONDANSETRON HCL 4 MG PO TABS: 4.0000 mg | ORAL_TABLET | Freq: Three times a day (TID) | ORAL | Status: DC | PRN

## 2013-09-22 NOTE — Progress Notes (Signed)
DC instructions reviewed with patient, questions answered, verbalized understanding.   No prescriptions given as patient states he does not want the Zofran that the provider prescribed as he does not have insurance and Pepto Bismol has worked for him at home.  Patient walked to front of hospital to be taken home by brother.  Patient in good condition upon leaving [facility] and plans to keep follow-up with GI Dr. Elnoria Howard.

## 2013-09-22 NOTE — Discharge Instructions (Signed)
Please follow upwith Dr. Elnoria Howard in 1 week

## 2013-09-22 NOTE — Discharge Summary (Signed)
Physician Discharge Summary  Brent Rogers ZOX:[id number] DOB: 01-Jan-1966 DOA: 09/20/2013  PCP: No PCP Per Patient  Admit date: 09/20/2013 Discharge date: 09/22/2013  Time spent: >35 minutes  Recommendations for Outpatient Follow-up:  F/u with Dr. Elnoria Howard in 1 week with biopsy results  Discharge Diagnoses:  Principal Problem:   Nausea & vomiting Active Problems:   Abdominal mass   Abdominal pain   Abdominal  pain, other specified site   Discharge Condition: stable   Diet recommendation: regular   Filed Weights   09/20/13 1516  Weight: 71.94 kg (158 lb 9.6 oz)    History of present illness:  47 y.o. male smoker without significant medical history presented with LLQ pain for 3 months, associated with nausea, vomiting and weight loss > 30 lb found to have pancreatic mass   Hospital Course:  1. Abdominal pain, nausea, vomiting likely related to underlying pancreatic mass; exam no s/s of acute abdomen;  -improved on supportive care IVF, antiemetics, control pain;  2. 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 This is worrisome for primary duodenal or pancreatic malignancy  -s/p EUS biopsy 12/24; f/u with GI next week with biopsy results  -need outpatient oncology follow up      Procedures:  EUS (i.e. Studies not automatically included, echos, thoracentesis, etc; not x-rays)  Consultations:  GI  Discharge Exam: Filed Vitals:   09/22/13 0513  BP: 112/79  Pulse: 69  Temp: 98.2 F (36.8 C)  Resp: 20    General: alert Cardiovascular: s1,s2 rrr Respiratory: CTA BL  Discharge Instructions  Discharge Orders   Future Orders Complete By Expires   Diet - low sodium heart healthy  As directed    Discharge instructions  As directed    Comments:     Please follow up with gastroenterologist in 1 week   Increase activity slowly  As directed        Medication List         acetaminophen 325 MG tablet   Commonly known as:  TYLENOL  Take 2 tablets (650 mg total) by mouth every 6 (six) hours as needed for mild pain (or Fever >/= 101).     bismuth subsalicylate 262 MG chewable tablet  Commonly known as:  PEPTO BISMOL  Chew 524 mg by mouth as needed for indigestion or diarrhea or loose stools.     ondansetron 4 MG tablet  Commonly known as:  ZOFRAN  Take 1 tablet (4 mg total) by mouth every 8 (eight) hours as needed for nausea or vomiting.       No Known Allergies     Follow-up Information   Follow up with HUNG,PATRICK D, MD. Schedule an appointment as soon as possible for a visit in 1 week.   Specialty:  Gastroenterology   Contact information:   [address] [place] [address] [phone]        The results of significant diagnostics from this hospitalization (including imaging, microbiology, ancillary and laboratory) are listed below for reference.    Significant Diagnostic Studies: Ct Abdomen Pelvis W Contrast  09/18/2013   CLINICAL DATA:  Left lower quadrant pain.  Diarrhea.  EXAM: CT ABDOMEN AND PELVIS WITH CONTRAST  TECHNIQUE: Multidetector CT imaging of the abdomen and pelvis was performed using the standard protocol following bolus administration of intravenous contrast.  CONTRAST:  OMNIPAQUE IOHEXOL 300 MG/ML  SOLN  COMPARISON:  None.  FINDINGS: Liver normal. Spleen normal. Spleen normal. Noted  along the descending portion the duodenum in the region of pancreatic head is a ill-defined 4.6 x 2.1 cm mass, representative image number 18/series 6. This is worrisome for pancreatic carcinoma or a duodenal malignancy. Peptic ulcer disease with swelling could also present in this fashion. Endoscopic evaluation is suggested. Portal vein and splenic vein appear patent. Celiac, SMA, and IMA are patent. Mesenteric veins are patent. Hepatic veins are patent. No definite periportal or peripancreatic adenopathy noted.  Adrenals normal. Simple renal cyst . No  hydronephrosis or obstructing ureteral stone. Bladder is nondistended. Phleboliths. Mild prostate enlargement. No free pelvic fluid.  No significant inguinal or retroperitoneal adenopathy. Aorta is widely patent.  There is no evidence of bowel obstruction. No inflammatory changes are in the right or left lower quadrant. What appears to be the appendix is normal. No gastric distention noted. Distal esophagus is unremarkable.  Abdominal wall is intact. No hernia. Heart size normal. No acute bony abnormality identified. Degenerative changes lumbar spine.  IMPRESSION: Large mass involving the duodenum and/or pancreatic head. This is worrisome for primary duodenal or pancreatic malignancy. Endoscopic evaluation is suggest for further evaluation. Endoscopic ultrasound directed biopsy may prove useful.   Electronically Signed   By: Maisie Fus  Register   On: 09/18/2013 16:45    Microbiology: No results found for this or any previous visit (from the past 240 hour(s)).   Labs: Basic Metabolic Panel:  Recent Labs Lab 09/18/13 1237 09/20/13 1024  NA 136 142  K 4.2 4.6  CL 100 103  CO2 22 27  GLUCOSE 89 96  BUN 8 5*  CREATININE 0.93 0.89  CALCIUM 9.7 9.4   Liver Function Tests:  Recent Labs Lab 09/18/13 1237 09/20/13 1024  AST 21 19  ALT 14 12  ALKPHOS 119* 101  BILITOT 0.4 0.3  PROT 8.0 7.5  ALBUMIN 4.5 4.1    Recent Labs Lab 09/18/13 1237 09/20/13 1024  LIPASE 25 26   No results found for this basename: AMMONIA,  in the last 168 hours CBC:  Recent Labs Lab 09/18/13 1237 09/20/13 1024  WBC 12.2* 6.6  NEUTROABS 9.0* 4.0  HGB 17.7* 16.5  HCT 51.2 48.3  MCV 88.4 88.0  PLT 232 216   Cardiac Enzymes:  Recent Labs Lab 09/20/13 1025  TROPONINI <0.30   BNP: BNP (last 3 results) No results found for this basename: PROBNP,  in the last 8760 hours CBG: No results found for this basename: GLUCAP,  in the last 168 hours     Signed:  Esperanza Sheets  [facility] 09/22/2013, 9:02 AM

## 2013-09-26 ENCOUNTER — Encounter (HOSPITAL_COMMUNITY): Payer: Self-pay | Admitting: Pharmacy Technician

## 2013-09-26 ENCOUNTER — Encounter (HOSPITAL_COMMUNITY): Payer: Self-pay | Admitting: *Deleted

## 2013-09-26 ENCOUNTER — Other Ambulatory Visit: Payer: Self-pay | Admitting: Gastroenterology

## 2013-09-29 NOTE — Anesthesia Preprocedure Evaluation (Addendum)
Anesthesia Evaluation  Patient identified by MRN, date of birth, ID band Patient awake    Reviewed: Allergy & Precautions, H&P , NPO status , Patient's Chart, lab work & pertinent test results  Airway Mallampati: I TM Distance: >3 FB Neck ROM: Full    Dental  (+) Dental Advisory Given   Pulmonary Current Smoker,          Cardiovascular negative cardio ROS  Rhythm:Regular Rate:Normal     Neuro/Psych negative neurological ROS  negative psych ROS   GI/Hepatic negative GI ROS, Neg liver ROS,   Endo/Other  negative endocrine ROS  Renal/GU negative Renal ROS     Musculoskeletal negative musculoskeletal ROS (+)   Abdominal   Peds  Hematology negative hematology ROS (+)   Anesthesia Other Findings   Reproductive/Obstetrics negative OB ROS                          Anesthesia Physical Anesthesia Plan  ASA: II  Anesthesia Plan: MAC   Post-op Pain Management:    Induction:   Airway Management Planned:   Additional Equipment:   Intra-op Plan:   Post-operative Plan:   Informed Consent: I have reviewed the patients History and Physical, chart, labs and discussed the procedure including the risks, benefits and alternatives for the proposed anesthesia with the patient or authorized representative who has indicated his/her understanding and acceptance.   Dental advisory given  Plan Discussed with: CRNA  Anesthesia Plan Comments:         Anesthesia Quick Evaluation

## 2013-09-30 ENCOUNTER — Encounter (HOSPITAL_COMMUNITY): Payer: Self-pay | Admitting: *Deleted

## 2013-09-30 ENCOUNTER — Encounter (HOSPITAL_COMMUNITY): Payer: Self-pay | Admitting: Anesthesiology

## 2013-09-30 ENCOUNTER — Encounter (HOSPITAL_COMMUNITY)
Admission: RE | Disposition: A | Discharge status: Home / Self Care | Payer: Self-pay | Source: Ambulatory Visit | Attending: Gastroenterology

## 2013-09-30 ENCOUNTER — Ambulatory Visit (HOSPITAL_COMMUNITY)
Admission: RE | Admit: 2013-09-30 | Discharge: 2013-09-30 | Disposition: A | Discharge status: Home / Self Care | Payer: Self-pay | Source: Ambulatory Visit | Attending: Gastroenterology | Admitting: Gastroenterology

## 2013-09-30 ENCOUNTER — Ambulatory Visit (HOSPITAL_COMMUNITY): Payer: Self-pay | Admitting: Anesthesiology

## 2013-09-30 DIAGNOSIS — K863 Pseudocyst of pancreas: Principal | ICD-10-CM

## 2013-09-30 DIAGNOSIS — K862 Cyst of pancreas: Secondary | ICD-10-CM | POA: Insufficient documentation

## 2013-09-30 HISTORY — DX: Other congenital malformations of pancreas and pancreatic duct: Q45.3

## 2013-09-30 HISTORY — PX: EUS: SHX5427

## 2013-09-30 LAB — AMYLASE, BODY FLUID: Amylase, Fluid: >10000 U/L

## 2013-09-30 SURGERY — UPPER ENDOSCOPIC ULTRASOUND (EUS) LINEAR
Anesthesia: Monitor Anesthesia Care

## 2013-09-30 MED ORDER — MIDAZOLAM HCL 2 MG/2ML IJ SOLN
INTRAMUSCULAR | Status: AC
  Filled 2013-09-30: qty 2

## 2013-09-30 MED ORDER — PROPOFOL 10 MG/ML IV BOLUS
INTRAVENOUS | Status: AC
  Filled 2013-09-30: qty 20

## 2013-09-30 MED ORDER — DEXAMETHASONE SODIUM PHOSPHATE 10 MG/ML IJ SOLN
INTRAMUSCULAR | Status: DC | PRN
  Administered 2013-09-30: 10 mg via INTRAVENOUS

## 2013-09-30 MED ORDER — PROPOFOL 10 MG/ML IV BOLUS
INTRAVENOUS | Status: DC | PRN
  Administered 2013-09-30: 30 mg via INTRAVENOUS

## 2013-09-30 MED ORDER — LIDOCAINE HCL (CARDIAC) 20 MG/ML IV SOLN
INTRAVENOUS | Status: DC | PRN
  Administered 2013-09-30: 50 mg via INTRAVENOUS

## 2013-09-30 MED ORDER — CIPROFLOXACIN IN D5W 400 MG/200ML IV SOLN
INTRAVENOUS | Status: AC
  Filled 2013-09-30: qty 200

## 2013-09-30 MED ORDER — DEXAMETHASONE SODIUM PHOSPHATE 10 MG/ML IJ SOLN
INTRAMUSCULAR | Status: AC
  Filled 2013-09-30: qty 1

## 2013-09-30 MED ORDER — BUTAMBEN-TETRACAINE-BENZOCAINE 2-2-14 % EX AERO
INHALATION_SPRAY | CUTANEOUS | Status: DC | PRN
  Administered 2013-09-30: 2 via TOPICAL

## 2013-09-30 MED ORDER — CIPROFLOXACIN HCL 500 MG PO TABS
500.0000 mg | ORAL_TABLET | Freq: Two times a day (BID) | ORAL | Status: DC
  Filled 2013-09-30 (×4): qty 1

## 2013-09-30 MED ORDER — METOPROLOL TARTRATE 1 MG/ML IV SOLN
INTRAVENOUS | Status: AC
  Filled 2013-09-30: qty 5

## 2013-09-30 MED ORDER — CIPROFLOXACIN IN D5W 400 MG/200ML IV SOLN
INTRAVENOUS | Status: DC | PRN
  Administered 2013-09-30: 400 mg via INTRAVENOUS

## 2013-09-30 MED ORDER — PROPOFOL 10 MG/ML IV BOLUS
INTRAVENOUS | Status: AC
  Filled 2013-09-30: qty 40

## 2013-09-30 MED ORDER — ONDANSETRON HCL 4 MG/2ML IJ SOLN
INTRAMUSCULAR | Status: DC | PRN
  Administered 2013-09-30: 4 mg via INTRAVENOUS

## 2013-09-30 MED ORDER — GLYCOPYRROLATE 0.2 MG/ML IJ SOLN
INTRAMUSCULAR | Status: DC | PRN
  Administered 2013-09-30 (×5): .04 mg via INTRAVENOUS

## 2013-09-30 MED ORDER — LIDOCAINE HCL (CARDIAC) 20 MG/ML IV SOLN
INTRAVENOUS | Status: AC
  Filled 2013-09-30: qty 5

## 2013-09-30 MED ORDER — PROPOFOL INFUSION 10 MG/ML OPTIME
INTRAVENOUS | Status: DC | PRN
  Administered 2013-09-30: 50 ug/kg/min via INTRAVENOUS

## 2013-09-30 MED ORDER — ONDANSETRON HCL 4 MG/2ML IJ SOLN
INTRAMUSCULAR | Status: AC
  Filled 2013-09-30: qty 2

## 2013-09-30 MED ORDER — CIPROFLOXACIN HCL 500 MG PO TABS: 500.0000 mg | ORAL_TABLET | Freq: Two times a day (BID) | ORAL | Status: DC

## 2013-09-30 MED ORDER — KETAMINE HCL 10 MG/ML IJ SOLN
INTRAMUSCULAR | Status: AC
  Filled 2013-09-30: qty 1

## 2013-09-30 MED ORDER — SODIUM CHLORIDE 0.9 % IV SOLN: INTRAVENOUS | Status: DC

## 2013-09-30 MED ORDER — MIDAZOLAM HCL 5 MG/5ML IJ SOLN
INTRAMUSCULAR | Status: DC | PRN
  Administered 2013-09-30 (×4): 1 mg via INTRAVENOUS

## 2013-09-30 MED ORDER — GLYCOPYRROLATE 0.2 MG/ML IJ SOLN
INTRAMUSCULAR | Status: AC
  Filled 2013-09-30: qty 1

## 2013-09-30 MED ORDER — LACTATED RINGERS IV SOLN: INTRAVENOUS | Status: DC

## 2013-09-30 MED ORDER — METOCLOPRAMIDE HCL 5 MG/ML IJ SOLN
INTRAMUSCULAR | Status: DC | PRN
  Administered 2013-09-30: 10 mg via INTRAVENOUS

## 2013-09-30 MED ORDER — LACTATED RINGERS IV SOLN
INTRAVENOUS | Status: DC
Start: 2013-09-30 — End: 2013-10-02
  Administered 2013-09-30: 10:00:00 via INTRAVENOUS
  Administered 2013-09-30: 1000 mL via INTRAVENOUS

## 2013-09-30 MED ORDER — KETAMINE HCL 10 MG/ML IJ SOLN
INTRAMUSCULAR | Status: DC | PRN
  Administered 2013-09-30 (×2): 1 mg via INTRAVENOUS
  Administered 2013-09-30: 2 mg via INTRAVENOUS
  Administered 2013-09-30 (×2): 1 mg via INTRAVENOUS
  Administered 2013-09-30: 10 mg via INTRAVENOUS
  Administered 2013-09-30 (×2): 2 mg via INTRAVENOUS
  Administered 2013-09-30 (×4): 1 mg via INTRAVENOUS
  Administered 2013-09-30 (×4): 2 mg via INTRAVENOUS
  Administered 2013-09-30: 30 mg via INTRAVENOUS
  Administered 2013-09-30: 1 mg via INTRAVENOUS
  Administered 2013-09-30: 2 mg via INTRAVENOUS
  Administered 2013-09-30: 10 mg via INTRAVENOUS
  Administered 2013-09-30 (×2): 1 mg via INTRAVENOUS
  Administered 2013-09-30: 2 mg via INTRAVENOUS
  Administered 2013-09-30: 1 mg via INTRAVENOUS
  Administered 2013-09-30 (×2): 2 mg via INTRAVENOUS
  Administered 2013-09-30: 10 mg via INTRAVENOUS
  Administered 2013-09-30 (×2): 1 mg via INTRAVENOUS
  Administered 2013-09-30 (×6): 2 mg via INTRAVENOUS
  Administered 2013-09-30: 1 mg via INTRAVENOUS
  Administered 2013-09-30: 2 mg via INTRAVENOUS
  Administered 2013-09-30 (×3): 1 mg via INTRAVENOUS
  Administered 2013-09-30 (×3): 2 mg via INTRAVENOUS

## 2013-09-30 NOTE — Op Note (Signed)
[place], [address]   ENDOSCOPIC ULTRASOUND PROCEDURE REPORT  PATIENT: Brent Rogers, Brent Rogers  MR#: [id number] BIRTHDATE: 1966-09-05  GENDER: Male ENDOSCOPIST: Carol Ada, MD REFERRED BY: PROCEDURE DATE:  09/30/2013 PROCEDURE:   Upper EUS ASA CLASS:      Class II INDICATIONS:   1.  abnormal CT of the GI tract. MEDICATIONS: MAC sedation, administered by CRNA  DESCRIPTION OF PROCEDURE:   After the risks benefits and alternatives of the procedure were  explained, informed consent was obtained. The patient was then placed in the left, lateral, decubitus postion and IV sedation was administered. Throughout the procedure, the patients blood pressure, pulse and oxygen saturations were monitored continuously.  Under direct visualization, the     endoscope was introduced through the mouth and advanced to the second portion of the duodenum .  Water was used as necessary to provide an acoustic interface.  Upon completion of the imaging, water was removed and the patient was sent to the recovery room in satisfactory condition.      FINDINGS: The pancreas was identified and there was evidence of two cysts in the head of the pancreas.  One measured 10 mm and the other 8 mm.  The CBD was clearly identfied and it measured 4 mm. The length of the CBD was able to be tracked along its entire course.  Both cysts were aspirated and bloody fluid was obtained with a 22 gauge neelde.  The head of the pancreas was abnormal appearing. There was an ill-defined area in the head of the pancreas that appeared to have smaller cysts as well as hyperechoic stranding.  Three passes with the 25 gauge FNA needle was attempted.  For unclear reasons I had significant difficulty with passing the needle into this region.   The scope was then withdrawn from the patient and the procedure completed.  COMPLICATIONS: There were no complications. ENDOSCOPIC  VISUALIZATION: ULTRASONIC VISUALIZATION: STAGING: ENDOSCOPIC IMPRESSION: 1) Pancreatic cysts s/p aspiration. 2) Ill-defined lesion in the head of the pancreas. RECOMMENDATIONS: 1) Await fluid analysis. 2) Cipro 500 mg BID x 3 days.  _______________________________ eSignedCarol Ada, MD 09/30/2013 10:54 AM   CC:

## 2013-09-30 NOTE — Transfer of Care (Signed)
Immediate Anesthesia Transfer of Care Note  Patient: Brent Rogers  Procedure(s) Performed: Procedure(s): UPPER ENDOSCOPIC ULTRASOUND (EUS) LINEAR (N/A)  Patient Location: PACU, Endo  Anesthesia Type:MAC  Level of Consciousness: Patient easily awoken, sedated, comfortable, cooperative, following commands, responds to stimulation.   Airway & Oxygen Therapy: Patient spontaneously breathing, ventilating well, oxygen via simple oxygen mask.  Post-op Assessment: Report given to PACU RN, vital signs reviewed and stable, moving all extremities.   Post vital signs: Reviewed and stable.  Complications: No apparent anesthesia complications

## 2013-09-30 NOTE — H&P (View-Only) (Signed)
The preliminary result from the FNA reveals normal pancreas.  I reviewed the CT scan in detail with Radiology and they report that the mass has a cystic component.  In retrospect, it appears that the structure I labeled as the CBD was actually a cyst.  The examination was difficult to perform as a result of patient tolerance to the sedation.  I am anticipating that the final FNA result will show normal pancreas.  In this situation, I will repeat his EUS with Propofol.  Plan: 1) Advance to a regular diet. 2) If he is tolerating the diet he can be discharged home.   3) I will make arrangements for a repeat EUS to obtain samples from the cystic lesions.

## 2013-09-30 NOTE — Preoperative (Signed)
Beta Blockers   Reason not to administer Beta Blockers:Not Applicable, not on home BB 

## 2013-09-30 NOTE — Discharge Instructions (Addendum)
Monitored Anesthesia Care  °Monitored anesthesia care is an anesthesia service for a medical procedure. Anesthesia is the loss of the ability to feel pain. It is produced by medications called anesthetics. It may affect a small area of your body (local anesthesia), a large area of your body (regional anesthesia), or your entire body (general anesthesia). The need for monitored anesthesia care depends your procedure, your condition, and the potential need for regional or general anesthesia. It is often provided during procedures where:  °· General anesthesia may be needed if there are complications. This is because you need special care when you are under general anesthesia.   °· You will be under local or regional anesthesia. This is so that you are able to have higher levels of anesthesia if needed.   °· You will receive calming medications (sedatives). This is especially the case if sedatives are given to put you in a semi-conscious state of relaxation (deep sedation). This is because the amount of sedative needed to produce this state can be hard to predict. Too much of a sedative can produce general anesthesia. °Monitored anesthesia care is performed by one or more caregivers who have special training in all types of anesthesia. You will need to meet with these caregivers before your procedure. During this meeting, they will ask you about your medical history. They will also give you instructions to follow. (For example, you will need to stop eating and drinking before your procedure. You may also need to stop or change medications you are taking.) During your procedure, your caregivers will stay with you. They will:  °· Watch your condition. This includes watching you blood pressure, breathing, and level of pain.   °· Diagnose and treat problems that occur.   °· Give medications if they are needed. These may include calming medications (sedatives) and anesthetics.   °· Make sure you are comfortable.   °Having  monitored anesthesia care does not necessarily mean that you will be under anesthesia. It does mean that your caregivers will be able to manage anesthesia if you need it or if it occurs. It also means that you will be able to have a different type of anesthesia than you are having if you need it. When your procedure is complete, your caregivers will continue to watch your condition. They will make sure any medications wear off before you are allowed to go home.  °Document Released: 06/11/2005 Document Revised: 01/10/2013 Document Reviewed: 10/27/2012 °Exit[employer]® Patient Information ©2014 [employer]. ° °

## 2013-09-30 NOTE — Interval H&P Note (Signed)
History and Physical Interval Note:  09/30/2013 9:18 AM  Brent Rogers  has presented today for surgery, with the diagnosis of pancreatic cancer  The various methods of treatment have been discussed with the patient and family. After consideration of risks, benefits and other options for treatment, the patient has consented to  Procedure(s): UPPER ENDOSCOPIC ULTRASOUND (EUS) LINEAR (N/A) as a surgical intervention .  The patient's history has been reviewed, patient examined, no change in status, stable for surgery.  I have reviewed the patient's chart and labs.  Questions were answered to the patient's satisfaction.     Jess Sulak D

## 2013-09-30 NOTE — Anesthesia Postprocedure Evaluation (Signed)
Anesthesia Post Note  Patient: Brent Rogers  Procedure(s) Performed: Procedure(s) (LRB): UPPER ENDOSCOPIC ULTRASOUND (EUS) LINEAR (N/A)  Anesthesia type: MAC  Patient location: PACU  Post pain: Pain level controlled  Post assessment: Post-op Vital signs reviewed  Last Vitals: BP 129/112  Pulse 66  Temp(Src) 36.4 C (Oral)  Resp 17  SpO2 99%  Post vital signs: Reviewed  Level of consciousness: awake  Complications: No apparent anesthesia complications

## 2013-10-03 ENCOUNTER — Encounter (HOSPITAL_COMMUNITY): Payer: Self-pay | Admitting: Gastroenterology

## 2013-10-03 LAB — CEA (CARCINOEMBRYONIC ANTIGEN), FLUID: CEA Fluid: 40.9 ng/mL — ABNORMAL HIGH (ref <10.0)

## 2013-10-25 ENCOUNTER — Other Ambulatory Visit: Payer: Self-pay | Admitting: Gastroenterology

## 2013-10-25 DIAGNOSIS — R933 Abnormal findings on diagnostic imaging of other parts of digestive tract: Secondary | ICD-10-CM

## 2013-10-25 DIAGNOSIS — C250 Malignant neoplasm of head of pancreas: Secondary | ICD-10-CM

## 2013-10-30 ENCOUNTER — Ambulatory Visit
Admission: RE | Admit: 2013-10-30 | Discharge: 2013-10-30 | Disposition: A | Discharge status: Home / Self Care | Payer: No Typology Code available for payment source | Source: Ambulatory Visit | Attending: Gastroenterology | Admitting: Gastroenterology

## 2013-10-30 ENCOUNTER — Other Ambulatory Visit: Payer: Self-pay | Admitting: Gastroenterology

## 2013-10-30 DIAGNOSIS — T1590XA Foreign body on external eye, part unspecified, unspecified eye, initial encounter: Secondary | ICD-10-CM

## 2013-10-30 DIAGNOSIS — R933 Abnormal findings on diagnostic imaging of other parts of digestive tract: Secondary | ICD-10-CM

## 2013-10-30 DIAGNOSIS — C250 Malignant neoplasm of head of pancreas: Secondary | ICD-10-CM

## 2013-10-30 MED ORDER — GADOBENATE DIMEGLUMINE 529 MG/ML IV SOLN
14.0000 mL | Freq: Once | INTRAVENOUS | Status: AC | PRN
  Administered 2013-10-30: 14 mL via INTRAVENOUS

## 2013-11-02 ENCOUNTER — Observation Stay (HOSPITAL_COMMUNITY)
Admission: EM | Admit: 2013-11-02 | Discharge: 2013-11-03 | Disposition: A | Discharge status: Home / Self Care | Payer: Self-pay | Attending: Internal Medicine | Admitting: Internal Medicine

## 2013-11-02 ENCOUNTER — Encounter (HOSPITAL_COMMUNITY): Payer: Self-pay | Admitting: Emergency Medicine

## 2013-11-02 DIAGNOSIS — F17200 Nicotine dependence, unspecified, uncomplicated: Secondary | ICD-10-CM | POA: Insufficient documentation

## 2013-11-02 DIAGNOSIS — R1013 Epigastric pain: Secondary | ICD-10-CM

## 2013-11-02 DIAGNOSIS — R109 Unspecified abdominal pain: Principal | ICD-10-CM | POA: Insufficient documentation

## 2013-11-02 DIAGNOSIS — R1900 Intra-abdominal and pelvic swelling, mass and lump, unspecified site: Secondary | ICD-10-CM

## 2013-11-02 DIAGNOSIS — R112 Nausea with vomiting, unspecified: Secondary | ICD-10-CM

## 2013-11-02 DIAGNOSIS — K869 Disease of pancreas, unspecified: Secondary | ICD-10-CM | POA: Insufficient documentation

## 2013-11-02 LAB — URINALYSIS, ROUTINE W REFLEX MICROSCOPIC
Bilirubin Urine: NEGATIVE
Glucose, UA: NEGATIVE mg/dL
Hgb urine dipstick: NEGATIVE
Ketones, ur: NEGATIVE mg/dL
Leukocytes, UA: NEGATIVE
Nitrite: NEGATIVE
Protein, ur: NEGATIVE mg/dL
Specific Gravity, Urine: 1.005 (ref 1.005–1.030)
Urobilinogen, UA: 0.2 mg/dL (ref 0.0–1.0)
pH: 5.5 (ref 5.0–8.0)

## 2013-11-02 LAB — CBC WITH DIFFERENTIAL/PLATELET
Basophils Absolute: 0 10*3/uL (ref 0.0–0.1)
Basophils Relative: 1 % (ref 0–1)
Eosinophils Absolute: 0.1 10*3/uL (ref 0.0–0.7)
Eosinophils Relative: 1 % (ref 0–5)
HCT: 48.5 % (ref 39.0–52.0)
Hemoglobin: 16.6 g/dL (ref 13.0–17.0)
Lymphocytes Relative: 30 % (ref 12–46)
Lymphs Abs: 2.5 10*3/uL (ref 0.7–4.0)
MCH: 29.6 pg (ref 26.0–34.0)
MCHC: 34.2 g/dL (ref 30.0–36.0)
MCV: 86.6 fL (ref 78.0–100.0)
Monocytes Absolute: 0.5 10*3/uL (ref 0.1–1.0)
Monocytes Relative: 6 % (ref 3–12)
Neutro Abs: 5.1 10*3/uL (ref 1.7–7.7)
Neutrophils Relative %: 62 % (ref 43–77)
Platelets: 235 10*3/uL (ref 150–400)
RBC: 5.6 MIL/uL (ref 4.22–5.81)
RDW: 14.3 % (ref 11.5–15.5)
WBC: 8.2 10*3/uL (ref 4.0–10.5)

## 2013-11-02 LAB — COMPREHENSIVE METABOLIC PANEL
ALT: 17 U/L (ref 0–53)
AST: 18 U/L (ref 0–37)
Albumin: 4.4 g/dL (ref 3.5–5.2)
Alkaline Phosphatase: 102 U/L (ref 39–117)
BUN: 5 mg/dL — ABNORMAL LOW (ref 6–23)
CO2: 26 mEq/L (ref 19–32)
Calcium: 9.7 mg/dL (ref 8.4–10.5)
Chloride: 99 mEq/L (ref 96–112)
Creatinine, Ser: 0.88 mg/dL (ref 0.50–1.35)
GFR calc Af Amer: >90 mL/min (ref >90)
GFR calc non Af Amer: >90 mL/min (ref >90)
Glucose, Bld: 84 mg/dL (ref 70–99)
Potassium: 3.9 mEq/L (ref 3.7–5.3)
Sodium: 139 mEq/L (ref 137–147)
Total Bilirubin: 0.5 mg/dL (ref 0.3–1.2)
Total Protein: 8.1 g/dL (ref 6.0–8.3)

## 2013-11-02 LAB — POCT I-STAT TROPONIN I: Troponin i, poc: 0 ng/mL (ref 0.00–0.08)

## 2013-11-02 LAB — LIPASE, BLOOD: Lipase: 30 U/L (ref 11–59)

## 2013-11-02 MED ORDER — HEPARIN SODIUM (PORCINE) 5000 UNIT/ML IJ SOLN
5000.0000 [IU] | Freq: Three times a day (TID) | INTRAMUSCULAR | Status: DC
  Administered 2013-11-02: 5000 [IU] via SUBCUTANEOUS
  Filled 2013-11-02 (×5): qty 1

## 2013-11-02 MED ORDER — HYDROMORPHONE HCL PF 1 MG/ML IJ SOLN
1.0000 mg | Freq: Once | INTRAMUSCULAR | Status: AC
Start: 2013-11-02 — End: 2013-11-02
  Administered 2013-11-02: 1 mg via INTRAVENOUS
  Filled 2013-11-02: qty 1

## 2013-11-02 MED ORDER — SODIUM CHLORIDE 0.9 % IV BOLUS (SEPSIS)
1000.0000 mL | Freq: Once | INTRAVENOUS | Status: AC
  Administered 2013-11-02: 1000 mL via INTRAVENOUS

## 2013-11-02 MED ORDER — HYDROMORPHONE HCL PF 2 MG/ML IJ SOLN
2.0000 mg | Freq: Once | INTRAMUSCULAR | Status: AC
  Administered 2013-11-02: 2 mg via INTRAVENOUS
  Filled 2013-11-02: qty 1

## 2013-11-02 MED ORDER — HYDROMORPHONE HCL PF 1 MG/ML IJ SOLN: 1.0000 mg | INTRAMUSCULAR | Status: DC | PRN

## 2013-11-02 NOTE — ED Notes (Signed)
Pt c/o Left sided abdominal pain radiating to back x 1 month. Pt sts the pain got worse in the last 3 days. Pt sts he has a mass above his pancreas and has an appointment to get it removed February 18th. Called dr. And dr. Rockey Situ him to come to the ED. Pt denies N/V, dizziness, fever, chest pain, SOB. A&Ox4. Pt does not know if mass is cancerous. Pt has not been given a diagnosis.

## 2013-11-02 NOTE — ED Provider Notes (Signed)
CSN: [id number]     Arrival date & time 11/02/13  1546 History   First MD Initiated Contact with Patient 11/02/13 1634     Chief Complaint  Patient presents with  . LUQ pain    (Consider location/radiation/quality/duration/timing/severity/associated sxs/prior Treatment) The history is provided by the patient and medical records.   This is a 48 year old male with past medical history significant for scoliosis and pancreatic abnormality presenting to the ED for increased abdominal pain of his LLQ.  States pain has not changed in location (LLQ) or nature (dull, aching), but just worse than normal.  Patient initially presented to the ED on 09/18/2013 for abdominal pain, head CT performed which showed pancreatic lesion suspicious for carcinoma. He is currently being followed by Dr. Benson Norway.  States over the past several days he has had increased abdominal pain with some radiation to back.  Denies nausea, vomiting, diarrhea, fever, or chills.  Also notes frequency of urination without dysuria or hematuria.  States he continues to attempt eating and drinking normally but he has indigestion after any PO intake which often prevents him from doing so.  Pt is not on any pain medications at home.   Pt is scheduled to see [facility] on 11/15/13 for consultation for tumor resection.  Pt had MRI on 10/30/13 with the following conclusions: IMPRESSION:  1. Mostly solid enhancing mass along the right side of the  pancreatic head has two internal cystic elements. 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 The mass abuts of the GDA  (which is sandwiched between of the mass and be adjacent normal  pancreatic parenchyma) and abuts but does not obstruct be common  bile duct. The lack of  hypervascularity makes neuroendocrine tumor  with cystic degeneration less likely. The imaging characteristics  are most worrisome for pancreatic adenocarcinoma. Gastrointestinal  stromal tumor could conceivably cause a similar appearance.  Percutaneous biopsy or resection likely indicated.  Past Medical History  Diagnosis Date  . Scoliosis   . Pancreatic abnormality     CT  shows mass   Past Surgical History  Procedure Laterality Date  . Lumbar disc surgery  1990's  . Finger fracture surgery Left 1995    5th digit  . Eus N/A 09/21/2013    Procedure: ESOPHAGEAL ENDOSCOPIC ULTRASOUND (EUS) RADIAL;  Surgeon: Beryle Beams, MD;  Location: [facility];  Service: Endoscopy;  Laterality: N/A;  . Fine needle aspiration N/A 09/21/2013    Procedure: FINE NEEDLE ASPIRATION (FNA) LINEAR;  Surgeon: Beryle Beams, MD;  Location: [facility];  Service: Endoscopy;  Laterality: N/A;  . Back surgery  yrs ago    lower  . Eus N/A 09/30/2013    Procedure: UPPER ENDOSCOPIC ULTRASOUND (EUS) LINEAR;  Surgeon: Beryle Beams, MD;  Location: [facility];  Service: Endoscopy;  Laterality: N/A;   No family history on file. History  Substance Use Topics  . Smoking status: Current Every Day Smoker -- 0.25 packs/day for 10 years    Types: Cigarettes  . Smokeless tobacco: Former Systems developer    Types: Snuff     Comment: 09/21/2013 "smoke < 1 pack cigarettes q 2 wks"  . Alcohol Use: Yes     Comment: 09/21/2013 "drink a beer couple times/month"    Review of  Systems  Gastrointestinal: Positive for abdominal pain.  All other systems reviewed and are negative.    Allergies  Review of patient's allergies indicates no known allergies.  Home Medications   Current Outpatient Rx  Name  Route  Sig  Dispense  Refill  . acetaminophen (TYLENOL) 325 MG tablet   Oral   Take 2 tablets (650 mg total) by mouth every 6 (six) hours as needed for mild pain (or Fever >/= 101).         . bismuth subsalicylate (PEPTO  BISMOL) 262 MG chewable tablet   Oral   Chew 524 mg by mouth as needed for indigestion or diarrhea or loose stools.          BP 115/81  Pulse 82  Temp(Src) 98.2 F (36.8 C) (Oral)  Resp 14  SpO2 100%  Physical Exam  Nursing note and vitals reviewed. Constitutional: He is oriented to person, place, and time. He appears well-developed and well-nourished. No distress.  HENT:  Head: Normocephalic and atraumatic.  Mouth/Throat: Oropharynx is clear and moist.  Eyes: Conjunctivae and EOM are normal. Pupils are equal, round, and reactive to light.  Neck: Normal range of motion. Neck supple.  Cardiovascular: Normal rate, regular rhythm and normal heart sounds.   Pulmonary/Chest: Effort normal and breath sounds normal. No respiratory distress. He has no wheezes.  Abdominal: Soft. Bowel sounds are normal. There is no tenderness. There is no guarding and no CVA tenderness.  Abdomen soft, nondistended, pain of left lower quadrant without focal tenderness to palpation  Musculoskeletal: Normal range of motion.  Neurological: He is alert and oriented to person, place, and time.  Skin: Skin is warm and dry. He is not diaphoretic.  Psychiatric: He has a normal mood and affect.    ED Course  Procedures (including critical care time) Labs Review Labs Reviewed  COMPREHENSIVE METABOLIC PANEL - Abnormal; Notable for the following:    BUN 5 (*)    All other components within normal limits  CBC WITH DIFFERENTIAL  LIPASE, BLOOD  URINALYSIS, ROUTINE W REFLEX MICROSCOPIC  POCT I-STAT TROPONIN I   Imaging Review No results found.  EKG Interpretation    Date/Time:  Wednesday November 02 2013 17:23:17 EST Ventricular Rate:  74 PR Interval:  165 QRS Duration: 74 QT Interval:  383 QTC Calculation: 425 R Axis:   77 Text Interpretation:  Sinus rhythm Biatrial enlargement ST elev, probable normal early repol pattern No significant change since last tracing Confirmed by HORTON  MD, COURTNEY  ([id number]) on 11/02/2013 5:28:36 PM            MDM   1. Epigastric pain   2. Abdominal mass    EKG NSR, no acute ischemic changes.  Trop negative.  Labs reassuring.  Discussed with GI, Dr. Collene Mares-- does not recommend repeat imaging, will opt for pain control.  Pt given multiple doses of dilaudid in the ED without improvement.  States when lying still pain is tolerable, but upon moving becomes unbearable.  Consulted hospitalist, Dr. Alcario Drought who will admit for pain control.  Larene Pickett, PA-C 11/02/13 2352

## 2013-11-02 NOTE — H&P (Signed)
[facility] History and Physical  Brent Rogers BJY:[id number] DOB: 1966/07/26 DOA: 11/02/2013  Referring physician: EDP PCP: No PCP Per Patient   Chief Complaint: Epigastric pain   HPI: Brent Rogers is a 48 y.o. male who presents to the ED with flare up of his epigastric pain.  Pain is epigastric and LUQ, onset ~3 months ago.  Episodic.  7 to 8 in intensity now, but will go weeks where it is 0 in intensity.  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 Elnoria Howard and patient is scheduled to consult with [facility] for resection of in the coming weeks.  He is on no chronic pain meds at home.  Review of Systems: Systems reviewed.  As above, otherwise negative  Past Medical History  Diagnosis Date  . Scoliosis   . Pancreatic abnormality     CT  shows mass   Past Surgical History  Procedure Laterality Date  . Lumbar disc surgery  1990's  . Finger fracture surgery Left 1995    5th digit  . Eus N/A 09/21/2013    Procedure: ESOPHAGEAL ENDOSCOPIC ULTRASOUND (EUS) RADIAL;  Surgeon: Theda Belfast, MD;  Location: [facility];  Service: Endoscopy;  Laterality: N/A;  . Fine needle aspiration N/A 09/21/2013    Procedure: FINE NEEDLE ASPIRATION (FNA) LINEAR;  Surgeon: Theda Belfast, MD;  Location: [facility];  Service: Endoscopy;  Laterality: N/A;  . Back surgery  yrs ago    lower  . Eus N/A 09/30/2013    Procedure: UPPER ENDOSCOPIC ULTRASOUND (EUS) LINEAR;  Surgeon: Theda Belfast, MD;  Location: [facility];  Service: Endoscopy;  Laterality: N/A;   Social History:  reports that he has been smoking Cigarettes.  He has a 2.5 pack-year smoking history. He has quit using smokeless tobacco. His smokeless tobacco use included Snuff. He reports that he drinks alcohol. He reports that he does not use illicit drugs.  No Known Allergies  No family history on file.   Prior to Admission medications   Medication Sig Start Date End Date Taking? Authorizing  Provider  acetaminophen (TYLENOL) 325 MG tablet Take 2 tablets (650 mg total) by mouth every 6 (six) hours as needed for mild pain (or Fever >/= 101). 09/22/13  Yes Esperanza Sheets, MD  bismuth subsalicylate (PEPTO BISMOL) 262 MG chewable tablet Chew 524 mg by mouth as needed for indigestion or diarrhea or loose stools.   Yes Historical Provider, MD   Physical Exam: Filed Vitals:   11/02/13 2212  BP: 145/96  Pulse: 70  Temp: 97.9 F (36.6 C)  Resp: 15    BP 145/96  Pulse 70  Temp(Src) 97.9 F (36.6 C) (Oral)  Resp 15  SpO2 98%  General Appearance:    Alert, oriented, no distress, appears stated age  Head:    Normocephalic, atraumatic  Eyes:    PERRL, EOMI, sclera non-icteric        Nose:   Nares without drainage or epistaxis. Mucosa, turbinates normal  Throat:   Moist mucous membranes. Oropharynx without erythema or exudate.  Neck:   Supple. No carotid bruits.  No thyromegaly.  No lymphadenopathy.   Back:     No CVA tenderness, no spinal tenderness  Lungs:     Clear to auscultation bilaterally, without wheezes, rhonchi or rales  Chest wall:    No tenderness to palpitation  Heart:    Regular rate and rhythm without murmurs, gallops, rubs  Abdomen:     Soft, non-tender, nondistended, normal bowel sounds, no organomegaly  Genitalia:    deferred  Rectal:    deferred  Extremities:   No clubbing, cyanosis or edema.  Pulses:   2+ and symmetric all extremities  Skin:   Skin color, texture, turgor normal, no rashes or lesions  Lymph nodes:   Cervical, supraclavicular, and axillary nodes normal  Neurologic:   CNII-XII intact. Normal strength, sensation and reflexes      throughout    Labs on Admission:  Basic Metabolic Panel:  Recent Labs Lab 11/02/13 1644  NA 139  K 3.9  CL 99  CO2 26  GLUCOSE 84  BUN 5*  CREATININE 0.88  CALCIUM 9.7   Liver Function Tests:  Recent Labs Lab 11/02/13 1644  AST 18  ALT 17  ALKPHOS 102  BILITOT 0.5  PROT 8.1  ALBUMIN 4.4     Recent Labs Lab 11/02/13 1644  LIPASE 30   No results found for this basename: AMMONIA,  in the last 168 hours CBC:  Recent Labs Lab 11/02/13 1644  WBC 8.2  NEUTROABS 5.1  HGB 16.6  HCT 48.5  MCV 86.6  PLT 235   Cardiac Enzymes: No results found for this basename: CKTOTAL, CKMB, CKMBINDEX, TROPONINI,  in the last 168 hours  BNP (last 3 results) No results found for this basename: PROBNP,  in the last 8760 hours CBG: No results found for this basename: GLUCAP,  in the last 168 hours  Radiological Exams on Admission: No results found.  EKG: Independently reviewed.  Assessment/Plan Active Problems:   Epigastric pain   1. Epigastric pain - will do pain control with dilaudid, try to transition to POs tomorrow, Followed by Dr. Elnoria Howard and scheduled to get consult with [facility] for resection in a couple of weeks.  Multiple biopsies of mass demonstrated no definite malignancy but due to CT appearance, the current thinking by Dr. Elnoria Howard is that its probably best to just get it removed.    Code Status: Full Code  Family Communication: No family in room Disposition Plan: Admit to inpatient   Time spent: 70 min  Durward Matranga M. [facility] Pager [phone]  If 7AM-7PM, please contact the day team taking care of the patient [email or web address] Password [email or web address] 11/02/2013, 10:15 PM

## 2013-11-02 NOTE — ED Provider Notes (Signed)
Medical screening examination/treatment/procedure(s) were conducted as a shared visit with non-physician practitioner(s) and myself.  I personally evaluated the patient during the encounter.     Patient here with intractable pain. Pancreatic mass found recently, has been having workup done, has appointment with surgery scheduled to plan for resection. MR showed signs concerning for pancreatic adenoCA. No fevers, vomiting, nausea. Unable to control pain with dilaudid. Patient admitted.  Osvaldo Shipper, MD 11/02/13 [phone]

## 2013-11-02 NOTE — ED Notes (Signed)
Per pt states he has above pancreas, increased pain-was told to come to ED for eval

## 2013-11-03 DIAGNOSIS — R1012 Left upper quadrant pain: Secondary | ICD-10-CM

## 2013-11-03 DIAGNOSIS — D490 Neoplasm of unspecified behavior of digestive system: Secondary | ICD-10-CM

## 2013-11-03 MED ORDER — OMEPRAZOLE 40 MG PO CPDR: 40.0000 mg | DELAYED_RELEASE_CAPSULE | Freq: Two times a day (BID) | ORAL | Status: DC

## 2013-11-03 MED ORDER — OXYCODONE HCL 5 MG PO TABS: 5.0000 mg | ORAL_TABLET | Freq: Four times a day (QID) | ORAL | Status: DC | PRN

## 2013-11-03 NOTE — Discharge Summary (Signed)
Physician Discharge Summary  CHAROD LINQUIST [id number] DOB: 1966-06-26 DOA: 11/02/2013  PCP: No PCP Per Patient  Admit date: 11/02/2013 Discharge date: 11/03/2013  Time spent: >29minutes  Recommendations for Outpatient Follow-up:  Follow-up Information   Follow up with [facility], MD. (on Feb 9th at 8:30am)    Specialty:  General Surgery   Contact information:   [address] [place] [address] [phone]        Discharge Diagnoses:  Active Problems:   Epigastric pain Pancreatic mass  Discharge Condition: improved/stable  Diet recommendation: regular  Filed Weights   11/02/13 2311  Weight: 72.213 kg (159 lb 3.2 oz)    History of present illness:  Pt is a 48 y.o. male who presents to the ED with flare up of his epigastric pain. Pain is epigastric and LUQ, onset ~3 months ago. Episodic. 7 to 8 in intensity now, but will go weeks where it is 0 in intensity. 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 Benson Norway and patient is scheduled to consult with [facility] for resection of in the coming weeks. He is on no chronic pain meds at home. He was admitted for further eval and management.    Hospital Course:    Epigastric pain -As discussed above, upon admission patient was placed on PPI and analgesics for pain management. -His symptoms improved overnight and he was placed on a diet which was advanced as he tolerated. -He was able to tolerate by mouth as well and following phone consultation with Dr. Benson Norway, he was discharged and was to follow up outpatient Pancreatic mass As mentioned in history of present illness patient had had an MRI done on 2/1 which showed mostly solid enhancing mass along the right side of the pancreatic head with 2 internal cystic elements. He discussed patient with Dr. Benson Norway who recommended consulting surgery with inpatient as he'll try to set him up for out patient visit with surgery but was having  difficulty doing so due to financial reasons. Surgery was consulted and they saw patient and he was set up for an outpatient visit-initial visit on no cost to him, and patient was agreeable to following up on Monday 2/9 as directed. Procedures:  none  Consultations:  Dr Barry Dienes  Dr Benson Norway via phone  Discharge Exam: Filed Vitals:   11/03/13 0615  BP: 110/76  Pulse: 65  Temp: 97.8 F (36.6 C)  Resp: 16    Exam:  General: alert & oriented x 3 In NAD Cardiovascular: RRR, nl S1 s2 Respiratory: CTAB Abdomen: soft +BS NT/ND, no masses palpable Extremities: No cyanosis and no edema    Discharge Instructions  Discharge Orders   Future Appointments Provider Department Dept Phone   11/07/2013 9:00 AM Stark Klein, MD [facility], [place] [phone]   Future Orders Complete By Expires   Diet general  As directed    Increase activity slowly  As directed        Medication List         acetaminophen 325 MG tablet  Commonly known as:  TYLENOL  Take 2 tablets (650 mg total) by mouth every 6 (six) hours as needed for mild pain (or Fever >/= 101).     bismuth subsalicylate 99991111 MG chewable tablet  Commonly known as:  PEPTO BISMOL  Chew 524 mg by mouth as needed for indigestion or diarrhea or loose stools.     omeprazole 40 MG  capsule  Commonly known as:  PRILOSEC  Take 1 capsule (40 mg total) by mouth 2 (two) times daily.     oxyCODONE 5 MG immediate release tablet  Commonly known as:  ROXICODONE  Take 1 tablet (5 mg total) by mouth every 6 (six) hours as needed for severe pain.       No Known Allergies     Follow-up Information   Follow up with [facility], [place]. (on Feb 9th at 8:30am)    Specialty:  General Surgery   Contact information:   [place] [address] [phone]        The results of significant diagnostics from this hospitalization (including imaging, microbiology, ancillary and laboratory) are listed below for  reference.    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 No significant bone abnormality identified.  IMPRESSION: No evidence of metallic foreign body within the orbits.   Electronically Signed   By: Sherryl Barters M.D.   On: 10/30/2013 12:04   Mr Abdomen W Wo Contrast  10/30/2013   CLINICAL DATA:  Left-sided abdominal pain.  Pancreatic lesion.  EXAM: MRI ABDOMEN WITHOUT AND WITH CONTRAST  TECHNIQUE: Multiplanar multisequence MR imaging of the abdomen was performed both before and after the administration of intravenous contrast.  CONTRAST:  2mL MULTIHANCE GADOBENATE DIMEGLUMINE 529 MG/ML IV SOLN  COMPARISON:  CT ABD/PELVIS W CM dated 09/18/2013  FINDINGS: A mass along the right margin of the pancreatic head is observed with following characteristics:  Size: 3.3 x 2.5 by 3.1 cm  Location: Pancreatic head along the right side, near the ampulla.  Characterization: Primarily solid, but with 2 cystic elements, 1 curvilinear superiorly and the other oval-shaped along the inferior portion of the mass.  Enhancement: Hypoenhancing relative to pancreatic parenchyma, with mild internal heterogeneity and some internal bandlike elements which have accentuated enhancement.  Other Characteristics: The mass is anterior to be common bile duct and dorsal pancreatic duct, both of which skirt the margins of the mass. The mass may partially suspend with around of the CBD in the vicinity of the pancreas, and is not readily separable from the ampulla.  Local extent of mass: Mass not readily separable from the posterior wall of the gastric antrum - intact fat plane is incomplete.  Vascular Involvement: Mass abuts and may partially wrap around be gastroduodenal artery as shown on images 47  through 55 of series 12, where they GDA appears to be located between the mass and the adjacent anterior margin of the pancreas. No involvement of the hepatic artery, celiac trunk, off or SMA. The mass does not definitively involve of the portal vein.  Variant hepatic artery anatomy: Conventional hepatic arterial branching anatomy, with the right and left hepatic arteries arising from knee proper hepatic artery.  Bile Duct Involvement: Not directly demonstrated, and there is no biliary dilatation, although of the mass is directly along be anterior margin of the common bile duct.  Variant biliary anatomy: No  Adjacent Nodes: No  Omental/Peritoneal Disease: Absent  Distant Metastases: Not observed.  The lung bases appear clear. Liver, spleen, and adrenal glands appear normal. Benign cyst, Bosniak category 1 cyst, left mid kidney, 1.0 cm in diameter on image 43 of series  16. Benign-appearing 5 mm cyst in the right kidney lower pole.  IMPRESSION: 1. Mostly solid enhancing mass along the right side of the pancreatic head has two internal cystic elements. Although there are some linear internal elements of the enhancement, I cannot definitively confirm the presence of a central scar, and there was no central calcification on of the prior CT scan to further support that this is a serous cystic neoplasm. The mass abuts of the GDA (which is sandwiched between of the mass and be adjacent normal pancreatic parenchyma) and abuts but does not obstruct be common bile duct. The lack of hypervascularity makes neuroendocrine tumor with cystic degeneration less likely. The imaging characteristics are most worrisome for pancreatic adenocarcinoma. Gastrointestinal stromal tumor could conceivably cause a similar appearance. Percutaneous biopsy or resection likely indicated.   Electronically Signed   By: Sherryl Barters M.D.   On: 10/30/2013 13:30    Microbiology: No results found for this or any previous visit (from the past 240  hour(s)).   Labs: Basic Metabolic Panel:  Recent Labs Lab 11/02/13 1644  NA 139  K 3.9  CL 99  CO2 26  GLUCOSE 84  BUN 5*  CREATININE 0.88  CALCIUM 9.7   Liver Function Tests:  Recent Labs Lab 11/02/13 1644  AST 18  ALT 17  ALKPHOS 102  BILITOT 0.5  PROT 8.1  ALBUMIN 4.4    Recent Labs Lab 11/02/13 1644  LIPASE 30   No results found for this basename: AMMONIA,  in the last 168 hours CBC:  Recent Labs Lab 11/02/13 1644  WBC 8.2  NEUTROABS 5.1  HGB 16.6  HCT 48.5  MCV 86.6  PLT 235   Cardiac Enzymes: No results found for this basename: CKTOTAL, CKMB, CKMBINDEX, TROPONINI,  in the last 168 hours BNP: BNP (last 3 results) No results found for this basename: PROBNP,  in the last 8760 hours CBG: No results found for this basename: GLUCAP,  in the last 168 hours     Signed:  Sheila Oats  [facility] 11/03/2013, 5:27 PM

## 2013-11-03 NOTE — Progress Notes (Signed)
UR completed 

## 2013-11-03 NOTE — Progress Notes (Signed)
Brent Rogers 09/11/1966  [id number].    Requesting MD: Dr. Dillard Essex Chief Complaint/Reason for Consult: Pancreatic head mass  HPI:  48 y/o AA male presented to [facility] with severe LUQ abdominal pain which radiates to the back.  His pain was not controlled with dilaudid.  He has intermittent problems with it over the last 3 months, but was more recently diagnosed with a pancreatic head mass while seeing Dr. Benson Norway.  We were consulted regarding the patients situation and inability to pay for office visit.  He is scheduled to see Dr. Barry Dienes on 11/15/13 for the pancreatic head mass which abuts the GDA and duodenum without compression.  Dr Benson Norway performed an EUS without malignant cells.  He was admitted yesterday to the hospitalist service for pain control.  His pain has since resolved and he wants to go home.  No N/V, no obstructive symptoms. He originally thought his pain was secondary to his scoliosis in his back so he ignored it.  ROS: All systems reviewed and otherwise negative except for as above  No family history on file.  Past Medical History  Diagnosis Date  . Scoliosis   . Pancreatic abnormality     CT  shows mass    Past Surgical History  Procedure Laterality Date  . Lumbar disc surgery  1990's  . Finger fracture surgery Left 1995    5th digit  . Eus N/A 09/21/2013    Procedure: ESOPHAGEAL ENDOSCOPIC ULTRASOUND (EUS) RADIAL;  Surgeon: Beryle Beams, MD;  Location: [facility] ENDOSCOPY;  Service: Endoscopy;  Laterality: N/A;  . Fine needle aspiration N/A 09/21/2013    Procedure: FINE NEEDLE ASPIRATION (FNA) LINEAR;  Surgeon: Beryle Beams, MD;  Location: [facility] ENDOSCOPY;  Service: Endoscopy;  Laterality: N/A;  . Back surgery  yrs ago    lower  . Eus N/A 09/30/2013    Procedure: UPPER ENDOSCOPIC ULTRASOUND (EUS) LINEAR;  Surgeon: Beryle Beams, MD;  Location: [facility] ENDOSCOPY;  Service: Endoscopy;  Laterality: N/A;    Social History:  reports that he has been smoking Cigarettes.  He has a 2.5  pack-year smoking history. He has quit using smokeless tobacco. His smokeless tobacco use included Snuff. He reports that he drinks alcohol. He reports that he does not use illicit drugs.  Allergies: No Known Allergies  Medications Prior to Admission  Medication Sig Dispense Refill  . acetaminophen (TYLENOL) 325 MG tablet Take 2 tablets (650 mg total) by mouth every 6 (six) hours as needed for mild pain (or Fever >/= 101).      . bismuth subsalicylate (PEPTO BISMOL) 262 MG chewable tablet Chew 524 mg by mouth as needed for indigestion or diarrhea or loose stools.        Blood pressure 110/76, pulse 65, temperature 97.8 F (36.6 C), temperature source Oral, resp. rate 16, height $RemoveBe'6\' 3"'jNXoUMdsJ$  (1.905 m), weight 159 lb 3.2 oz (72.213 kg), SpO2 99.00%. Physical Exam: General: pleasant, WD/WN AA male who is laying in bed in NAD HEENT: head is normocephalic, atraumatic.  Sclera are noninjected.  PERRL.  Ears and nose without any masses or lesions.  Mouth is pink and moist Abd: soft, thin, NT/ND, +BS, no masses, hernias, or organomegaly, no abdominal scars noted MS: all 4 extremities are symmetrical with no cyanosis, clubbing, or edema. Skin: warm and dry with no masses, lesions, or rashes; noted eyelash, eyebrow, and head alopecia  Psych: A&Ox3 with an appropriate affect.   Results for orders placed during the hospital encounter of 11/02/13 (from  the past 48 hour(s))  URINALYSIS, ROUTINE W REFLEX MICROSCOPIC     Status: None   Collection Time    11/02/13  4:43 PM      Result Value Range   Color, Urine YELLOW  YELLOW   APPearance CLEAR  CLEAR   Specific Gravity, Urine 1.005  1.005 - 1.030   pH 5.5  5.0 - 8.0   Glucose, UA NEGATIVE  NEGATIVE mg/dL   Hgb urine dipstick NEGATIVE  NEGATIVE   Bilirubin Urine NEGATIVE  NEGATIVE   Ketones, ur NEGATIVE  NEGATIVE mg/dL   Protein, ur NEGATIVE  NEGATIVE mg/dL   Urobilinogen, UA 0.2  0.0 - 1.0 mg/dL   Nitrite NEGATIVE  NEGATIVE   Leukocytes, UA NEGATIVE   NEGATIVE   Comment: MICROSCOPIC NOT DONE ON URINES WITH NEGATIVE PROTEIN, BLOOD, LEUKOCYTES, NITRITE, OR GLUCOSE <1000 mg/dL.  CBC WITH DIFFERENTIAL     Status: None   Collection Time    11/02/13  4:44 PM      Result Value Range   WBC 8.2  4.0 - 10.5 K/uL   RBC 5.60  4.22 - 5.81 MIL/uL   Hemoglobin 16.6  13.0 - 17.0 g/dL   HCT 48.5  39.0 - 52.0 %   MCV 86.6  78.0 - 100.0 fL   MCH 29.6  26.0 - 34.0 pg   MCHC 34.2  30.0 - 36.0 g/dL   RDW 14.3  11.5 - 15.5 %   Platelets 235  150 - 400 K/uL   Neutrophils Relative % 62  43 - 77 %   Neutro Abs 5.1  1.7 - 7.7 K/uL   Lymphocytes Relative 30  12 - 46 %   Lymphs Abs 2.5  0.7 - 4.0 K/uL   Monocytes Relative 6  3 - 12 %   Monocytes Absolute 0.5  0.1 - 1.0 K/uL   Eosinophils Relative 1  0 - 5 %   Eosinophils Absolute 0.1  0.0 - 0.7 K/uL   Basophils Relative 1  0 - 1 %   Basophils Absolute 0.0  0.0 - 0.1 K/uL  COMPREHENSIVE METABOLIC PANEL     Status: Abnormal   Collection Time    11/02/13  4:44 PM      Result Value Range   Sodium 139  137 - 147 mEq/L   Potassium 3.9  3.7 - 5.3 mEq/L   Chloride 99  96 - 112 mEq/L   CO2 26  19 - 32 mEq/L   Glucose, Bld 84  70 - 99 mg/dL   BUN 5 (*) 6 - 23 mg/dL   Creatinine, Ser 0.88  0.50 - 1.35 mg/dL   Calcium 9.7  8.4 - 10.5 mg/dL   Total Protein 8.1  6.0 - 8.3 g/dL   Albumin 4.4  3.5 - 5.2 g/dL   AST 18  0 - 37 U/L   ALT 17  0 - 53 U/L   Alkaline Phosphatase 102  39 - 117 U/L   Total Bilirubin 0.5  0.3 - 1.2 mg/dL   GFR calc non Af Amer >90  >90 mL/min   GFR calc Af Amer >90  >90 mL/min   Comment: (NOTE)     The eGFR has been calculated using the CKD EPI equation.     This calculation has not been validated in all clinical situations.     eGFR's persistently <90 mL/min signify possible Chronic Kidney     Disease.  LIPASE, BLOOD     Status: None   Collection  Time    11/02/13  4:44 PM      Result Value Range   Lipase 30  11 - 59 U/L  POCT I-STAT TROPONIN I     Status: None   Collection Time     11/02/13  4:59 PM      Result Value Range   Troponin i, poc 0.00  0.00 - 0.08 ng/mL   Comment 3            Comment: Due to the release kinetics of cTnI,     a negative result within the first hours     of the onset of symptoms does not rule out     myocardial infarction with certainty.     If myocardial infarction is still suspected,     repeat the test at appropriate intervals.   No results found.    Assessment/Plan Pancreatic head mass 3.3 x 2.5 by 3.1 cm LUQ abdominal pain  Plan: 1.  We have offered him a sooner appointment on Monday February 9th at 9:00am with Dr. Barry Dienes at no cost for the initial visit.  He will need to be there by 8:30 to fill out paperwork.  Discussed that they will need to make some sort of financial arrangements.  I talked to [facility] Dr. Marlowe Aschoff nurse who has facilitated this appointment change.  Dr. Barry Dienes really needs to see him in the office so she can fully explain the potential whipple procedure to him and family members.  He is agreeable to going home today and being seen in the office on Monday.   Coralie Keens, [facility] 11/03/2013, 3:09 PM Pager: [phone]

## 2013-11-03 NOTE — Discharge Planning (Signed)
Pt discharged with follow-up appt with Dr. Barry Dienes on Mon. A.m. Discharge instructions given, pt. Expressed understanding. He will take the [place] bus home.

## 2013-11-06 NOTE — Progress Notes (Signed)
Agree with above.  Will see Monday.

## 2013-11-07 ENCOUNTER — Ambulatory Visit (INDEPENDENT_AMBULATORY_CARE_PROVIDER_SITE_OTHER): Payer: Self-pay | Admitting: General Surgery

## 2013-11-07 ENCOUNTER — Encounter (INDEPENDENT_AMBULATORY_CARE_PROVIDER_SITE_OTHER): Payer: Self-pay

## 2013-11-07 ENCOUNTER — Telehealth (INDEPENDENT_AMBULATORY_CARE_PROVIDER_SITE_OTHER): Payer: Self-pay | Admitting: *Deleted

## 2013-11-07 ENCOUNTER — Encounter (INDEPENDENT_AMBULATORY_CARE_PROVIDER_SITE_OTHER): Payer: Self-pay | Admitting: General Surgery

## 2013-11-07 ENCOUNTER — Other Ambulatory Visit (INDEPENDENT_AMBULATORY_CARE_PROVIDER_SITE_OTHER): Payer: Self-pay | Admitting: General Surgery

## 2013-11-07 VITALS — BP 136/72 | HR 76 | Temp 98.0°F | Resp 18 | Ht 75.0 in | Wt 166.0 lb

## 2013-11-07 DIAGNOSIS — C250 Malignant neoplasm of head of pancreas: Secondary | ICD-10-CM

## 2013-11-07 DIAGNOSIS — K861 Other chronic pancreatitis: Secondary | ICD-10-CM | POA: Insufficient documentation

## 2013-11-07 NOTE — Progress Notes (Signed)
Chief Complaint  Patient presents with  . New Evaluation    pancreatic mass    HISTORY: Patient is a 48 year old male who came to medical attention in December when he started having epigastric abdominal pain and nausea. He was seen in the emergency department and had a questionable mass in the head of his pancreas on CT. Dr. Benson Norway performed an endoscopic ultrasound and also saw a mass.  Biopsies are nondiagnostic. He is scheduled for followup. In the intervening time, he went to the emergency department and underwent an MRI. This also confirmed the presence of the mass. He denies any significant weight loss other than maybe 5 pounds. He does not have any diarrhea. His father does take Creon for pancreatic insufficiency and so he is familiar with this medication.  His mother had a form of intra-abdominal cancer but he is not sure what. She passed away 9 years ago.  Past Medical History  Diagnosis Date  . Scoliosis   . Pancreatic abnormality     CT  shows mass    Past Surgical History  Procedure Laterality Date  . Lumbar disc surgery  1990's  . Finger fracture surgery Left 1995    5th digit  . Eus N/A 09/21/2013    Procedure: ESOPHAGEAL ENDOSCOPIC ULTRASOUND (EUS) RADIAL;  Surgeon: Beryle Beams, MD;  Location: [facility];  Service: Endoscopy;  Laterality: N/A;  . Fine needle aspiration N/A 09/21/2013    Procedure: FINE NEEDLE ASPIRATION (FNA) LINEAR;  Surgeon: Beryle Beams, MD;  Location: [facility];  Service: Endoscopy;  Laterality: N/A;  . Back surgery  yrs ago    lower  . Eus N/A 09/30/2013    Procedure: UPPER ENDOSCOPIC ULTRASOUND (EUS) LINEAR;  Surgeon: Beryle Beams, MD;  Location: [facility];  Service: Endoscopy;  Laterality: N/A;    Current Outpatient Prescriptions  Medication Sig Dispense Refill  . acetaminophen (TYLENOL) 325 MG tablet Take 2 tablets (650 mg total) by mouth every 6 (six) hours as needed for mild pain (or Fever >/= 101).      Marland Kitchen acetaminophen-codeine  (TYLENOL #3) 300-30 MG per tablet Take by mouth every 4 (four) hours as needed for moderate pain.      Marland Kitchen bismuth subsalicylate (PEPTO BISMOL) 262 MG chewable tablet Chew 524 mg by mouth as needed for indigestion or diarrhea or loose stools.      Marland Kitchen omeprazole (PRILOSEC) 40 MG capsule Take 1 capsule (40 mg total) by mouth 2 (two) times daily.  60 capsule  0  . oxyCODONE (ROXICODONE) 5 MG immediate release tablet Take 1 tablet (5 mg total) by mouth every 6 (six) hours as needed for severe pain.  20 tablet  0   No current facility-administered medications for this visit.     No Known Allergies   Family History  Problem Relation Age of Onset  . Cancer Mother     unsure     History   Social History  . Marital Status: Single    Spouse Name: N/A    Number of Children: N/A  . Years of Education: N/A   Social History Main Topics  . Smoking status: Current Every Day Smoker -- 0.25 packs/day for 10 years    Types: Cigarettes  . Smokeless tobacco: Former Systems developer    Types: Snuff     Comment: 09/21/2013 "smoke < 1 pack cigarettes q 2 wks"  . Alcohol Use: Yes     Comment: 09/21/2013 "drink a beer couple times/month"  . Drug  Use: No  . Sexual Activity: Yes    REVIEW OF SYSTEMS - PERTINENT POSITIVES ONLY: 12 point review of systems negative other than HPI and PMH  EXAM: Filed Vitals:   11/07/13 0851  BP: 136/72  Pulse: 76  Temp: 98 F (36.7 C)  Resp: 18    Wt Readings from Last 3 Encounters:  11/07/13 166 lb (75.297 kg)  11/02/13 159 lb 3.2 oz (72.213 kg)  09/20/13 158 lb 9.6 oz (71.94 kg)     Gen:  No acute distress.  Thin, but well groomed.  Smells of smoke.   Neurological: Alert and oriented to person, place, and time. Coordination normal.  Head: Normocephalic and atraumatic.  Eyes: Conjunctivae are normal. Pupils are equal, round, and reactive to light. No scleral icterus.  Neck: Normal range of motion. Neck supple. No tracheal deviation or thyromegaly present.   Cardiovascular: Normal rate, regular rhythm, normal heart sounds and intact distal pulses.  Exam reveals no gallop and no friction rub.  No murmur heard. Respiratory: Effort normal.  No respiratory distress. No chest wall tenderness. Breath sounds normal.  No wheezes, rales or rhonchi.  GI: Soft. Bowel sounds are normal. The abdomen is soft and nontender.  There is no rebound and no guarding. no abdominal scars. Musculoskeletal: Normal range of motion. Extremities are nontender.  Lymphadenopathy: No cervical, preauricular, postauricular or axillary adenopathy is present Skin: Skin is warm and dry. No rash noted. No diaphoresis. No erythema. No pallor. No clubbing, cyanosis, or edema.   Psychiatric: Normal mood and affect. Behavior is normal. Judgment and thought content normal.    LABORATORY RESULTS: Available labs are reviewed  Cytology from EUS - no malignant cells seen.  Recent Results (from the past 2160 hour(s))  CBC WITH DIFFERENTIAL     Status: Abnormal   Collection Time    09/18/13 12:37 PM      Result Value Range   WBC 12.2 (*) 4.0 - 10.5 K/uL   RBC 5.79  4.22 - 5.81 MIL/uL   Hemoglobin 17.7 (*) 13.0 - 17.0 g/dL   HCT 51.2  39.0 - 52.0 %   MCV 88.4  78.0 - 100.0 fL   MCH 30.6  26.0 - 34.0 pg   MCHC 34.6  30.0 - 36.0 g/dL   RDW 13.8  11.5 - 15.5 %   Platelets 232  150 - 400 K/uL   Neutrophils Relative % 74  43 - 77 %   Neutro Abs 9.0 (*) 1.7 - 7.7 K/uL   Lymphocytes Relative 21  12 - 46 %   Lymphs Abs 2.5  0.7 - 4.0 K/uL   Monocytes Relative 5  3 - 12 %   Monocytes Absolute 0.6  0.1 - 1.0 K/uL   Eosinophils Relative 0  0 - 5 %   Eosinophils Absolute 0.0  0.0 - 0.7 K/uL   Basophils Relative 0  0 - 1 %   Basophils Absolute 0.0  0.0 - 0.1 K/uL  COMPREHENSIVE METABOLIC PANEL     Status: Abnormal   Collection Time    09/18/13 12:37 PM      Result Value Range   Sodium 136  135 - 145 mEq/L   Potassium 4.2  3.5 - 5.1 mEq/L   Chloride 100  96 - 112 mEq/L   CO2 22  19 - 32  mEq/L   Glucose, Bld 89  70 - 99 mg/dL   BUN 8  6 - 23 mg/dL   Creatinine, Ser 0.93  0.50 -  1.35 mg/dL   Calcium 9.7  8.4 - 10.5 mg/dL   Total Protein 8.0  6.0 - 8.3 g/dL   Albumin 4.5  3.5 - 5.2 g/dL   AST 21  0 - 37 U/L   ALT 14  0 - 53 U/L   Alkaline Phosphatase 119 (*) 39 - 117 U/L   Total Bilirubin 0.4  0.3 - 1.2 mg/dL   GFR calc non Af Amer >90  >90 mL/min   GFR calc Af Amer >90  >90 mL/min   Comment: (NOTE)     The eGFR has been calculated using the CKD EPI equation.     This calculation has not been validated in all clinical situations.     eGFR's persistently <90 mL/min signify possible Chronic Kidney     Disease.  LIPASE, BLOOD     Status: None   Collection Time    09/18/13 12:37 PM      Result Value Range   Lipase 25  11 - 59 U/L  URINALYSIS, ROUTINE W REFLEX MICROSCOPIC     Status: Abnormal   Collection Time    09/18/13  3:31 PM      Result Value Range   Color, Urine YELLOW  YELLOW   APPearance CLEAR  CLEAR   Specific Gravity, Urine 1.014  1.005 - 1.030   pH 5.0  5.0 - 8.0   Glucose, UA NEGATIVE  NEGATIVE mg/dL   Hgb urine dipstick NEGATIVE  NEGATIVE   Bilirubin Urine NEGATIVE  NEGATIVE   Ketones, ur 15 (*) NEGATIVE mg/dL   Protein, ur NEGATIVE  NEGATIVE mg/dL   Urobilinogen, UA 0.2  0.0 - 1.0 mg/dL   Nitrite NEGATIVE  NEGATIVE   Leukocytes, UA NEGATIVE  NEGATIVE   Comment: MICROSCOPIC NOT DONE ON URINES WITH NEGATIVE PROTEIN, BLOOD, LEUKOCYTES, NITRITE, OR GLUCOSE <1000 mg/dL.  CBC WITH DIFFERENTIAL     Status: None   Collection Time    09/20/13 10:24 AM      Result Value Range   WBC 6.6  4.0 - 10.5 K/uL   RBC 5.49  4.22 - 5.81 MIL/uL   Hemoglobin 16.5  13.0 - 17.0 g/dL   HCT 48.3  39.0 - 52.0 %   MCV 88.0  78.0 - 100.0 fL   MCH 30.1  26.0 - 34.0 pg   MCHC 34.2  30.0 - 36.0 g/dL   RDW 13.8  11.5 - 15.5 %   Platelets 216  150 - 400 K/uL   Neutrophils Relative % 61  43 - 77 %   Neutro Abs 4.0  1.7 - 7.7 K/uL   Lymphocytes Relative 30  12 - 46 %    Lymphs Abs 2.0  0.7 - 4.0 K/uL   Monocytes Relative 8  3 - 12 %   Monocytes Absolute 0.5  0.1 - 1.0 K/uL   Eosinophils Relative 1  0 - 5 %   Eosinophils Absolute 0.1  0.0 - 0.7 K/uL   Basophils Relative 1  0 - 1 %   Basophils Absolute 0.0  0.0 - 0.1 K/uL  COMPREHENSIVE METABOLIC PANEL     Status: Abnormal   Collection Time    09/20/13 10:24 AM      Result Value Range   Sodium 142  135 - 145 mEq/L   Potassium 4.6  3.5 - 5.1 mEq/L   Chloride 103  96 - 112 mEq/L   CO2 27  19 - 32 mEq/L   Glucose, Bld 96  70 - 99  mg/dL   BUN 5 (*) 6 - 23 mg/dL   Creatinine, Ser 0.89  0.50 - 1.35 mg/dL   Calcium 9.4  8.4 - 10.5 mg/dL   Total Protein 7.5  6.0 - 8.3 g/dL   Albumin 4.1  3.5 - 5.2 g/dL   AST 19  0 - 37 U/L   ALT 12  0 - 53 U/L   Alkaline Phosphatase 101  39 - 117 U/L   Total Bilirubin 0.3  0.3 - 1.2 mg/dL   GFR calc non Af Amer >90  >90 mL/min   GFR calc Af Amer >90  >90 mL/min   Comment: (NOTE)     The eGFR has been calculated using the CKD EPI equation.     This calculation has not been validated in all clinical situations.     eGFR's persistently <90 mL/min signify possible Chronic Kidney     Disease.  LIPASE, BLOOD     Status: None   Collection Time    09/20/13 10:24 AM      Result Value Range   Lipase 26  11 - 59 U/L  TROPONIN I     Status: None   Collection Time    09/20/13 10:25 AM      Result Value Range   Troponin I <0.30  <0.30 ng/mL   Comment:            Due to the release kinetics of cTnI,     a negative result within the first hours     of the onset of symptoms does not rule out     myocardial infarction with certainty.     If myocardial infarction is still suspected,     repeat the test at appropriate intervals.  URINALYSIS, ROUTINE W REFLEX MICROSCOPIC     Status: Abnormal   Collection Time    09/20/13  1:53 PM      Result Value Range   Color, Urine YELLOW  YELLOW   APPearance CLOUDY (*) CLEAR   Specific Gravity, Urine 1.020  1.005 - 1.030   pH 5.5  5.0 -  8.0   Glucose, UA NEGATIVE  NEGATIVE mg/dL   Hgb urine dipstick NEGATIVE  NEGATIVE   Bilirubin Urine NEGATIVE  NEGATIVE   Ketones, ur 15 (*) NEGATIVE mg/dL   Protein, ur NEGATIVE  NEGATIVE mg/dL   Urobilinogen, UA 1.0  0.0 - 1.0 mg/dL   Nitrite NEGATIVE  NEGATIVE   Leukocytes, UA NEGATIVE  NEGATIVE   Comment: MICROSCOPIC NOT DONE ON URINES WITH NEGATIVE PROTEIN, BLOOD, LEUKOCYTES, NITRITE, OR GLUCOSE <1000 mg/dL.  CANCER ANTIGEN 19-9     Status: Abnormal   Collection Time    09/21/13  3:45 PM      Result Value Range   CA 19-9 31.0 (*) <35.0 U/mL   Comment: Performed at [facility]  CEA (CARCINOEMBRYONIC ANTIGEN), FLUID     Status: Abnormal   Collection Time    09/30/13 10:30 AM      Result Value Range   CEA Fluid 40.9 (*) <10.0 ng/mL   Comment: (NOTE)     Specimen moderately hemolyzed     This test was performed using the Siemens (Bayer)     Chemiluminescent method. Values obtained from     different assay methods cannot be used inter-     changeably. CEA levels, regardless of value,     should not be interpreted as absolute evidence     of presence or absence of disease.  Performed at [facility]  AMYLASE, BODY FLUID     Status: None   Collection Time    09/30/13 10:33 AM      Result Value Range   Amylase, Fluid >10000     Comment: RESULTS CONFIRMED BY MANUAL DILUTION     Performed at [facility]   Fluid Type-FAMY CYSTS     Comment: PANCREATIC  URINALYSIS, ROUTINE W REFLEX MICROSCOPIC     Status: None   Collection Time    11/02/13  4:43 PM      Result Value Range   Color, Urine YELLOW  YELLOW   APPearance CLEAR  CLEAR   Specific Gravity, Urine 1.005  1.005 - 1.030   pH 5.5  5.0 - 8.0   Glucose, UA NEGATIVE  NEGATIVE mg/dL   Hgb urine dipstick NEGATIVE  NEGATIVE   Bilirubin Urine NEGATIVE  NEGATIVE   Ketones, ur NEGATIVE  NEGATIVE mg/dL   Protein, ur NEGATIVE  NEGATIVE mg/dL   Urobilinogen, UA 0.2  0.0 - 1.0 mg/dL   Nitrite NEGATIVE  NEGATIVE    Leukocytes, UA NEGATIVE  NEGATIVE   Comment: MICROSCOPIC NOT DONE ON URINES WITH NEGATIVE PROTEIN, BLOOD, LEUKOCYTES, NITRITE, OR GLUCOSE <1000 mg/dL.  CBC WITH DIFFERENTIAL     Status: None   Collection Time    11/02/13  4:44 PM      Result Value Range   WBC 8.2  4.0 - 10.5 K/uL   RBC 5.60  4.22 - 5.81 MIL/uL   Hemoglobin 16.6  13.0 - 17.0 g/dL   HCT 48.5  39.0 - 52.0 %   MCV 86.6  78.0 - 100.0 fL   MCH 29.6  26.0 - 34.0 pg   MCHC 34.2  30.0 - 36.0 g/dL   RDW 14.3  11.5 - 15.5 %   Platelets 235  150 - 400 K/uL   Neutrophils Relative % 62  43 - 77 %   Neutro Abs 5.1  1.7 - 7.7 K/uL   Lymphocytes Relative 30  12 - 46 %   Lymphs Abs 2.5  0.7 - 4.0 K/uL   Monocytes Relative 6  3 - 12 %   Monocytes Absolute 0.5  0.1 - 1.0 K/uL   Eosinophils Relative 1  0 - 5 %   Eosinophils Absolute 0.1  0.0 - 0.7 K/uL   Basophils Relative 1  0 - 1 %   Basophils Absolute 0.0  0.0 - 0.1 K/uL  COMPREHENSIVE METABOLIC PANEL     Status: Abnormal   Collection Time    11/02/13  4:44 PM      Result Value Range   Sodium 139  137 - 147 mEq/L   Potassium 3.9  3.7 - 5.3 mEq/L   Chloride 99  96 - 112 mEq/L   CO2 26  19 - 32 mEq/L   Glucose, Bld 84  70 - 99 mg/dL   BUN 5 (*) 6 - 23 mg/dL   Creatinine, Ser 0.88  0.50 - 1.35 mg/dL   Calcium 9.7  8.4 - 10.5 mg/dL   Total Protein 8.1  6.0 - 8.3 g/dL   Albumin 4.4  3.5 - 5.2 g/dL   AST 18  0 - 37 U/L   ALT 17  0 - 53 U/L   Alkaline Phosphatase 102  39 - 117 U/L   Total Bilirubin 0.5  0.3 - 1.2 mg/dL   GFR calc non Af Amer >90  >90 mL/min   GFR calc Af Amer >90  >90  mL/min   Comment: (NOTE)     The eGFR has been calculated using the CKD EPI equation.     This calculation has not been validated in all clinical situations.     eGFR's persistently <90 mL/min signify possible Chronic Kidney     Disease.  LIPASE, BLOOD     Status: None   Collection Time    11/02/13  4:44 PM      Result Value Range   Lipase 30  11 - 59 U/L  POCT I-STAT TROPONIN I      Status: None   Collection Time    11/02/13  4:59 PM      Result Value Range   Troponin i, poc 0.00  0.00 - 0.08 ng/mL   Comment 3            Comment: Due to the release kinetics of cTnI,     a negative result within the first hours     of the onset of symptoms does not rule out     myocardial infarction with certainty.     If myocardial infarction is still suspected,     repeat the test at appropriate intervals.     RADIOLOGY RESULTS: See E-Chart or I-Site for most recent results.  Images and reports are reviewed.  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 No significant bone abnormality identified.  IMPRESSION: No evidence of metallic foreign body within the orbits.   Electronically Signed   By: Sherryl Barters M.D.   On: 10/30/2013 12:04   Mr Abdomen W Wo Contrast  10/30/2013   CLINICAL DATA:  Left-sided abdominal pain.  Pancreatic lesion.  EXAM: MRI ABDOMEN WITHOUT AND WITH CONTRAST  TECHNIQUE: Multiplanar multisequence MR imaging of the abdomen was performed both before and after the administration of intravenous contrast.  CONTRAST:  61m MULTIHANCE GADOBENATE DIMEGLUMINE 529 MG/ML IV SOLN  COMPARISON:  CT ABD/PELVIS W CM dated 09/18/2013  FINDINGS: A mass along the right margin of the pancreatic head is observed with following characteristics:  Size: 3.3 x 2.5 by 3.1 cm  Location: Pancreatic head along the right side, near the ampulla.  Characterization: Primarily solid, but with 2 cystic elements, 1 curvilinear superiorly and the other oval-shaped along the inferior portion of the mass.  Enhancement: Hypoenhancing relative to pancreatic parenchyma, with mild internal heterogeneity and some internal bandlike elements which have accentuated enhancement.  Other Characteristics:  The mass is anterior to be common bile duct and dorsal pancreatic duct, both of which skirt the margins of the mass. The mass may partially suspend with around of the CBD in the vicinity of the pancreas, and is not readily separable from the ampulla.  Local extent of mass: Mass not readily separable from the posterior wall of the gastric antrum - intact fat plane is incomplete.  Vascular Involvement: Mass abuts and may partially wrap around be gastroduodenal artery as shown on images 47 through 55 of series 12, where they GDA appears to be located between the mass and the adjacent anterior margin of the pancreas. No involvement of the hepatic artery, celiac trunk, off or SMA. The mass does not definitively involve of the portal vein.  Variant hepatic artery anatomy: Conventional hepatic arterial branching anatomy, with the right  and left hepatic arteries arising from knee proper hepatic artery.  Bile Duct Involvement: Not directly demonstrated, and there is no biliary dilatation, although of the mass is directly along be anterior margin of the common bile duct.  Variant biliary anatomy: No  Adjacent Nodes: No  Omental/Peritoneal Disease: Absent  Distant Metastases: Not observed.  The lung bases appear clear. Liver, spleen, and adrenal glands appear normal. Benign cyst, Bosniak category 1 cyst, left mid kidney, 1.0 cm in diameter on image 43 of series 16. Benign-appearing 5 mm cyst in the right kidney lower pole.  IMPRESSION: 1. Mostly solid enhancing mass along the right side of the pancreatic head has two internal cystic elements. Although there are some linear internal elements of the enhancement, I cannot definitively confirm the presence of a central scar, and there was no central calcification on of the prior CT scan to further support that this is a serous cystic neoplasm. The mass abuts of the GDA (which is sandwiched between of the mass and be adjacent normal pancreatic parenchyma) and abuts but does not  obstruct be common bile duct. The lack of hypervascularity makes neuroendocrine tumor with cystic degeneration less likely. The imaging characteristics are most worrisome for pancreatic adenocarcinoma. Gastrointestinal stromal tumor could conceivably cause a similar appearance. Percutaneous biopsy or resection likely indicated.   Electronically Signed   By: Sherryl Barters M.D.   On: 10/30/2013 13:30      ASSESSMENT AND PLAN: Malignant neoplasm of head of pancreas Patient appears to have a highly suspicious lesion in the head of his pancreas. This is worrisome for pancreatic cancer due to the infiltrative nature and indistinct nature of the mass.  He has a mass on MRI and on EUS. His mother had some form of intra-abdominal cancer and passed away from this 9 years ago.  We'll plan to do diagnostic laparoscopy followed by a Whipple. I would like to rule out metastatic disease to the chest with a chest CT. He has had a CA 19-9 which was normal in December.  The patient was advised to stop smoking and drinking completely. I reviewed the increased risk of postoperative complications if he continues this. We discussed that he may not have cancer and may have significant complications from surgery. However, the risk of this being a pancreatic cancer and going untreated is higher than the risk of surgery.  I discussed the surgery with the patient including diagrams of anatomy.  I discussed the potential for diagnostic laparoscopy.  In the case of pancreatic cancer, if spread of the disease is found, we will abort the procedure and not proceed with resection.  The rationale for this was discussed with the patient.  There has not been data to support resection of Stage IV disease in terms of survival benefit.    We discussed possible complications including: Potential of aborting procedure if tumor is invading the superior mesenteric or hepatic arteries Bleeding Infection and possible wound  complications such as hernia Damage to adjacent structures Leak of anastamoses, primarily pancreatic Possible need for other procedures Possible prolonged hospital stay Possible development of diabetes or worsening of current diabetes.  Possible pancreatic exocrine insufficiency Prolonged fatigue/weakness/appetite Difficulty with eating or post operative nausea Possible early recurrence of cancer   The patient understands and wishes to proceed.  The patient has been advised to turn in disability paperwork to our office.          Milus Height MD Surgical Oncology, [facility]      Visit Diagnoses: 1. Malignant neoplasm of head of pancreas     Primary Care Physician: No PCP Per Patient

## 2013-11-07 NOTE — Patient Instructions (Signed)
WHIPPLE (PANCREATICODUODENECTOMY) THE OPERATION: The goal of the operation is to remove masses in the head of the pancreas, the distal bile duct, or the duodenum.  The structures that are removed include the head of the pancreas, the duodenum, the gallbladder, and a small portion of your stomach.  The jejunum, or middle part of the small intestine, is then used to reconnect the bile duct, the pancreas, and the stomach to the intestinal tract.  The reason so much needs to be removed is that the blood supply and lymphatic channels and nodes are closely interconnected.  The operation takes between 4 and 8 hours depending on the amount of scar tissue you have present from prior surgeries, or from the mass.    WHAT HAPPENS IN THE OPERATING ROOM: I will start the operation with a diagnostic laparoscopy.  This means we will make small incisions to see if there is visible cancer outside of the pancreas.  If there is visible spread of cancer, I will stop the operation because research has shown that survival for pancreatic cancer is worse if you undergo a big operation without being able to remove all the cancer.  If no other cancer is seen, I will make a bigger incision on your abdomen and begin mobilizing the stomach, duodenum and gallbladder to see if the tumor is able to be removed.  Sometimes the tumor is too adherent to the arteries and veins supplying the liver and small intestine that it is unsafe or not possible to remove the tumor.  If this is the case, the procedure would be stopped and the tumor would be left in place.  Whatever the operative findings, I will discuss the case with your family after we are done in the operating room.  I will talk to you in the next few days when you are more awake.  I will see you in the hospital every weekday that I am not out of town.  My partners help see patients on the weekends and if I am out of town.    WHAT HAPPENS AFTER SURGERY: After surgery, you will go  first to the recovery room, then to the ICU for careful monitoring.  It is important that I am able to know your vital signs and urine output to see how you are doing.  Depending on many factors, you may be in the ICU overnight, or for several days.  You will have many tubes and lines in place which is standard for this operation.  You will have several IVs, including possibly a central line in your chest or neck.  You will likely have an arterial line in your wrist.  You will have a tube in your nose for 4-5 days in order to suction out the stomach and liver secretions to help the surgical connections heal.  YOU WILL NOT BE ABLE TO EAT FOR AT LEAST 4-5 DAYS AFTER SURGERY.  You will have a catheter in your bladder.  On your abdomen, you will have several surgical drains and possibly a pain pump with numbing medicine.  You will have compression stockings on your legs to decrease the risk of blood clots.    Sometimes anesthesia makes a decision that you may need to be left on the breathing machine for a period after surgery.  This may be based on your overall health status, or events during surgery.    We will address your pain in several ways.  We will use an IV pain pump   called a "PCA," or Patient Controlled Analgesia.  This allows you to press a button and immediately receive a dose of pain medication without waiting for a nurse.  We also use IV Tylenol and sometimes IV Toradol which is similar to ibuprofen.  You may also have a pump with numbing medicine delivered directly to your incision.  I use doses and medications that work for the majority of people, but you may need an adjustment to the dose or type of medicine if your pain is not adequately controlled.  Your throat may be sore, in which case you may need a throat spray or lozenges.    We will ask you to get out of bed the day after surgery in order to maximize your chances of not having complications.  Your risk of pneumonia and blood clots is lower  with walking and sitting in the chair.  We will also ask you to perform breathing exercises.  We will also ask to you walk in your room and in the halls for the above reasons, but also in order for you to keep up your strength.    EATING: We will usually start you on clear liquids in around 5 days if your bowel function seems to have returned.  We advance your diet slowly to make sure you are tolerating each step.  All patients do not have a normal appetite when they go home and usually have to take 2-4 cans of nutritional supplement per day while this is improving.  Most patients also find that their taste buds do not seem the same right after surgery, and this can continue into the time of possible post operative chemotherapy and radiation.  Some patients develop diabetes and will need assistance from a primary care doctor for medication.    OTHER TREATMENT? I will present your case when the pathology is available at our GI Multidisciplinary conference which occurs every 1-2 weeks.  Medical and Radiation Oncologists are present, as well as the pathologist and radiologist in addition to myself.  If you have a larger tumor or if you have positive lymph nodes, we may have the oncologist stop by to see you while you are in the hospital.  Most firm post op treatment plans occur, however, once you are an outpatient because we will need to see how you are recovering from surgery.  GOING HOME! Usually you are able to go home in 8-14 days, depending on whether or not complications happen and what is going on with your overall health status.   If you have more health problems or if you have limited help at home, the therapists and nurses may recommend a temporary rehab or nursing facility to help you get back on your feet before you go home.  These decisions would be made while you are in the hospital with the assistance of a social worker or case manager.    Please bring all insurance/disability forms to our  office for the staff to fill out   POSSIBLE COMPLICATIONS This is a very extensive operation and includes complications listed below: Bleeding Infection and possible wound complications such as hernia Damage to adjacent structures Leak of surgical connections, the worst of which is the connection between the intestine and the pancreas (20%) Possible need for other procedures, such as abscess drains in radiology or endoscopy.   Possible prolonged hospital stay Possible development of diabetes or worsening of current diabetes. (5-20%) Possible diarrhea from lack of pancreatic enzymes.   Prolonged   fatigue/weakness/appetite MOST PATIENTS' ENERGY LEVEL IS NOT BACK TO NORMAL FOR AT LEAST 4-6 MONTHS.  OLDER PATIENTS MAY FEEL WEAK FOR LONGER PERIODS OF TIME.   Difficulty with eating or post operative nausea (around 30%) Possible early recurrence of cancer Possible complications of your medical problems such as heart disease or arrhythmias. Death (less than 2%)  All possible complications are not listed, just the most common.    FURTHER INFORMATION? Please ask questions if you find something that we did not discuss in the office and would like more information.  If you would like another appointment if you have many questions or if your family members would like to come as well, please contact the office.     IF YOU ARE TAKING ASPIRIN, PLAVIX, COUMADIN, OR OTHER BLOOD THINNERS, LET US KNOW IMMEDIATELY SO WE CAN CONTACT YOUR PRESCRIBING HEALTH CARE PROVIDER TO HOLD THE MEDICATION FOR 5-7 DAYS BEFORE SURGERY  

## 2013-11-07 NOTE — Assessment & Plan Note (Signed)
Patient appears to have a highly suspicious lesion in the head of his pancreas. This is worrisome for pancreatic cancer due to the infiltrative nature and indistinct nature of the mass.  He has a mass on MRI and on EUS. His mother had some form of intra-abdominal cancer and passed away from this 9 years ago.  We'll plan to do diagnostic laparoscopy followed by a Whipple. I would like to rule out metastatic disease to the chest with a chest CT. He has had a CA 19-9 which was normal in December.  The patient was advised to stop smoking and drinking completely. I reviewed the increased risk of postoperative complications if he continues this. We discussed that he may not have cancer and may have significant complications from surgery. However, the risk of this being a pancreatic cancer and going untreated is higher than the risk of surgery.  I discussed the surgery with the patient including diagrams of anatomy.  I discussed the potential for diagnostic laparoscopy.  In the case of pancreatic cancer, if spread of the disease is found, we will abort the procedure and not proceed with resection.  The rationale for this was discussed with the patient.  There has not been data to support resection of Stage IV disease in terms of survival benefit.    We discussed possible complications including: Potential of aborting procedure if tumor is invading the superior mesenteric or hepatic arteries Bleeding Infection and possible wound complications such as hernia Damage to adjacent structures Leak of anastamoses, primarily pancreatic Possible need for other procedures Possible prolonged hospital stay Possible development of diabetes or worsening of current diabetes.  Possible pancreatic exocrine insufficiency Prolonged fatigue/weakness/appetite Difficulty with eating or post operative nausea Possible early recurrence of cancer   The patient understands and wishes to proceed.  The patient has been  advised to turn in disability paperwork to our office.

## 2013-11-07 NOTE — Telephone Encounter (Signed)
I spoke with pt and informed him of the appt for his CT scan at [facility] on 11/10/13 with an arrival time of 8:15am.  He is agreeable with this appt.

## 2013-11-10 ENCOUNTER — Ambulatory Visit
Admission: RE | Admit: 2013-11-10 | Discharge: 2013-11-10 | Disposition: A | Discharge status: Home / Self Care | Payer: No Typology Code available for payment source | Source: Ambulatory Visit | Attending: General Surgery | Admitting: General Surgery

## 2013-11-10 DIAGNOSIS — C250 Malignant neoplasm of head of pancreas: Secondary | ICD-10-CM

## 2013-11-10 MED ORDER — IOHEXOL 300 MG/ML  SOLN
75.0000 mL | Freq: Once | INTRAMUSCULAR | Status: AC | PRN
  Administered 2013-11-10: 75 mL via INTRAVENOUS

## 2013-11-15 ENCOUNTER — Ambulatory Visit (INDEPENDENT_AMBULATORY_CARE_PROVIDER_SITE_OTHER): Payer: Self-pay | Admitting: General Surgery

## 2013-11-22 ENCOUNTER — Telehealth (INDEPENDENT_AMBULATORY_CARE_PROVIDER_SITE_OTHER): Payer: Self-pay

## 2013-11-22 NOTE — Telephone Encounter (Signed)
F/u on pt's cardiac clearance.  Office of Dr. Bettina Gavia closed for weather.  Will call tomorrow.

## 2013-11-23 ENCOUNTER — Encounter (HOSPITAL_COMMUNITY): Payer: Self-pay | Admitting: Pharmacy Technician

## 2013-11-28 ENCOUNTER — Encounter (HOSPITAL_COMMUNITY): Payer: Self-pay

## 2013-11-28 ENCOUNTER — Encounter (HOSPITAL_COMMUNITY)
Admission: RE | Admit: 2013-11-28 | Discharge: 2013-11-28 | Disposition: A | Discharge status: Home / Self Care | Payer: Self-pay | Source: Ambulatory Visit | Attending: General Surgery | Admitting: General Surgery

## 2013-11-28 ENCOUNTER — Encounter (INDEPENDENT_AMBULATORY_CARE_PROVIDER_SITE_OTHER): Payer: Self-pay

## 2013-11-28 DIAGNOSIS — Z01812 Encounter for preprocedural laboratory examination: Secondary | ICD-10-CM | POA: Insufficient documentation

## 2013-11-28 LAB — COMPREHENSIVE METABOLIC PANEL
ALT: 14 U/L (ref 0–53)
AST: 25 U/L (ref 0–37)
Albumin: 4.3 g/dL (ref 3.5–5.2)
Alkaline Phosphatase: 127 U/L — ABNORMAL HIGH (ref 39–117)
BUN: 5 mg/dL — ABNORMAL LOW (ref 6–23)
CO2: 26 mEq/L (ref 19–32)
Calcium: 9.7 mg/dL (ref 8.4–10.5)
Chloride: 97 mEq/L (ref 96–112)
Creatinine, Ser: 0.84 mg/dL (ref 0.50–1.35)
GFR calc Af Amer: >90 mL/min (ref >90)
GFR calc non Af Amer: >90 mL/min (ref >90)
Glucose, Bld: 90 mg/dL (ref 70–99)
Potassium: 4.5 mEq/L (ref 3.7–5.3)
Sodium: 136 mEq/L — ABNORMAL LOW (ref 137–147)
Total Bilirubin: 0.4 mg/dL (ref 0.3–1.2)
Total Protein: 7.7 g/dL (ref 6.0–8.3)

## 2013-11-28 LAB — CBC WITH DIFFERENTIAL/PLATELET
Basophils Absolute: 0 10*3/uL (ref 0.0–0.1)
Basophils Relative: 1 % (ref 0–1)
Eosinophils Absolute: 0.1 10*3/uL (ref 0.0–0.7)
Eosinophils Relative: 2 % (ref 0–5)
HCT: 46.4 % (ref 39.0–52.0)
Hemoglobin: 16.1 g/dL (ref 13.0–17.0)
Lymphocytes Relative: 37 % (ref 12–46)
Lymphs Abs: 2.9 10*3/uL (ref 0.7–4.0)
MCH: 30 pg (ref 26.0–34.0)
MCHC: 34.7 g/dL (ref 30.0–36.0)
MCV: 86.4 fL (ref 78.0–100.0)
Monocytes Absolute: 0.4 10*3/uL (ref 0.1–1.0)
Monocytes Relative: 6 % (ref 3–12)
Neutro Abs: 4.3 10*3/uL (ref 1.7–7.7)
Neutrophils Relative %: 55 % (ref 43–77)
Platelets: 263 10*3/uL (ref 150–400)
RBC: 5.37 MIL/uL (ref 4.22–5.81)
RDW: 14.5 % (ref 11.5–15.5)
WBC: 7.8 10*3/uL (ref 4.0–10.5)

## 2013-11-28 LAB — APTT: aPTT: 27 seconds (ref 24–37)

## 2013-11-28 LAB — ABO/RH: ABO/RH(D): O POS

## 2013-11-28 LAB — PREPARE RBC (CROSSMATCH)

## 2013-11-28 LAB — PROTIME-INR
INR: 0.94 (ref 0.00–1.49)
Prothrombin Time: 12.4 seconds (ref 11.6–15.2)

## 2013-11-28 NOTE — Patient Instructions (Addendum)
[place]  11/28/2013   Your procedure is scheduled on:  3-5 -2015  Report to [facility] at     [place]   AM.  Call this number if you have problems the morning of surgery: [phone]  Or Presurgical Testing [phone](Patt Steinhardt) For Living Will and/or Health Care Power Attorney Forms: please provide copy for your medical record,may bring AM of surgery(Forms should be already notarized -we do not provide this service).(11-28-13 Patient desires no further information today.)  Remember: Follow any bowel prep instructions per MD office.( Use Fleet Enema night before surgery).    Do not eat food:After Midnight.    Take these medicines the morning of surgery with A SIP OF WATER: Oxycodone. Tylenol.   Do not wear jewelry, make-up or nail polish.  Do not wear lotions, powders, or perfumes. You may wear deodorant.  Do not shave 48 hours(2 days) prior to first CHG shower(legs and under arms).(Shaving face and neck okay.)  Do not bring valuables to the hospital.(Hospital is not responsible for lost valuables).  Contacts, dentures or removable bridgework, body piercing, hair pins may not be worn into surgery.  Leave suitcase in the car. After surgery it may be brought to your room.  For patients admitted to the hospital, checkout time is 11:00 AM the day of discharge.(Restricted visitors-Any Persons displaying flu-like symptoms or illness).    Patients discharged the day of surgery will not be allowed to drive home. Must have responsible person with you x 24 hours once discharged.  Name and phone number of your driver: Infant Zink [phone] home  Special Instructions: CHG(Chlorhedine 4%-"Hibiclens","Betasept","Aplicare") Shower Use Special Wash: see special instructions.(avoid face and genitals)   Please read over the following fact sheets that you were given: MRSA Information, Blood Transfusion fact sheet, Incentive Spirometry Instruction.  Remember : Type/Screen  "Blue armbands" - may not be removed once applied(would result in being retested AM of surgery, if removed).  Failure to follow these instructions may result in Cancellation of your surgery.   Patient signature_______________________________________________________

## 2013-11-28 NOTE — Pre-Procedure Instructions (Signed)
11-28-13 EKG 2'15, CT Chest 11-10-13 -Epic.

## 2013-11-29 ENCOUNTER — Other Ambulatory Visit (HOSPITAL_COMMUNITY): Payer: Self-pay

## 2013-12-01 ENCOUNTER — Inpatient Hospital Stay (HOSPITAL_COMMUNITY)
Admission: RE | Admit: 2013-12-01 | Discharge: 2013-12-09 | DRG: 407 | Disposition: A | Discharge status: Home / Self Care | Payer: No Typology Code available for payment source | Source: Ambulatory Visit | Attending: General Surgery | Admitting: General Surgery

## 2013-12-01 ENCOUNTER — Encounter (HOSPITAL_COMMUNITY): Payer: Self-pay | Admitting: Anesthesiology

## 2013-12-01 ENCOUNTER — Inpatient Hospital Stay (HOSPITAL_COMMUNITY): Payer: Self-pay

## 2013-12-01 ENCOUNTER — Encounter (HOSPITAL_COMMUNITY)
Admission: RE | Disposition: A | Discharge status: Home / Self Care | Payer: Self-pay | Source: Ambulatory Visit | Attending: General Surgery

## 2013-12-01 ENCOUNTER — Inpatient Hospital Stay (HOSPITAL_COMMUNITY): Payer: Self-pay | Admitting: Anesthesiology

## 2013-12-01 ENCOUNTER — Encounter (HOSPITAL_COMMUNITY): Payer: Self-pay | Admitting: *Deleted

## 2013-12-01 DIAGNOSIS — Z808 Family history of malignant neoplasm of other organs or systems: Secondary | ICD-10-CM

## 2013-12-01 DIAGNOSIS — E876 Hypokalemia: Secondary | ICD-10-CM | POA: Diagnosis not present

## 2013-12-01 DIAGNOSIS — Z79899 Other long term (current) drug therapy: Secondary | ICD-10-CM

## 2013-12-01 DIAGNOSIS — K861 Other chronic pancreatitis: Secondary | ICD-10-CM

## 2013-12-01 DIAGNOSIS — Z01812 Encounter for preprocedural laboratory examination: Secondary | ICD-10-CM

## 2013-12-01 DIAGNOSIS — C250 Malignant neoplasm of head of pancreas: Secondary | ICD-10-CM | POA: Diagnosis present

## 2013-12-01 DIAGNOSIS — F17200 Nicotine dependence, unspecified, uncomplicated: Secondary | ICD-10-CM | POA: Diagnosis present

## 2013-12-01 DIAGNOSIS — K859 Acute pancreatitis without necrosis or infection, unspecified: Principal | ICD-10-CM | POA: Diagnosis present

## 2013-12-01 DIAGNOSIS — M4120 Other idiopathic scoliosis, site unspecified: Secondary | ICD-10-CM | POA: Diagnosis present

## 2013-12-01 HISTORY — PX: LAPAROSCOPY: SHX197

## 2013-12-01 HISTORY — PX: WHIPPLE PROCEDURE: SHX2667

## 2013-12-01 LAB — CBC
HCT: 41.8 % (ref 39.0–52.0)
Hemoglobin: 14 g/dL (ref 13.0–17.0)
MCH: 29.4 pg (ref 26.0–34.0)
MCHC: 33.5 g/dL (ref 30.0–36.0)
MCV: 87.6 fL (ref 78.0–100.0)
Platelets: 206 10*3/uL (ref 150–400)
RBC: 4.77 MIL/uL (ref 4.22–5.81)
RDW: 14.3 % (ref 11.5–15.5)
WBC: 15.5 10*3/uL — ABNORMAL HIGH (ref 4.0–10.5)

## 2013-12-01 LAB — CREATININE, SERUM
Creatinine, Ser: 0.74 mg/dL (ref 0.50–1.35)
GFR calc Af Amer: >90 mL/min (ref >90)
GFR calc non Af Amer: >90 mL/min (ref >90)

## 2013-12-01 SURGERY — LAPAROSCOPY, DIAGNOSTIC
Anesthesia: General | Site: Abdomen

## 2013-12-01 MED ORDER — ACETAMINOPHEN 10 MG/ML IV SOLN
1000.0000 mg | Freq: Four times a day (QID) | INTRAVENOUS | Status: AC
  Administered 2013-12-01 – 2013-12-02 (×3): 1000 mg via INTRAVENOUS
  Filled 2013-12-01 (×7): qty 100

## 2013-12-01 MED ORDER — HYDROMORPHONE HCL PF 1 MG/ML IJ SOLN
INTRAMUSCULAR | Status: DC | PRN
  Administered 2013-12-01 (×2): 0.5 mg via INTRAVENOUS
  Administered 2013-12-01 (×2): 1 mg via INTRAVENOUS

## 2013-12-01 MED ORDER — BUPIVACAINE-EPINEPHRINE 0.25% -1:200000 IJ SOLN
INTRAMUSCULAR | Status: DC | PRN
  Administered 2013-12-01: 6.5 mL

## 2013-12-01 MED ORDER — PROPOFOL 10 MG/ML IV BOLUS
INTRAVENOUS | Status: AC
  Filled 2013-12-01: qty 20

## 2013-12-01 MED ORDER — FENTANYL CITRATE 0.05 MG/ML IJ SOLN
INTRAMUSCULAR | Status: AC
  Filled 2013-12-01: qty 5

## 2013-12-01 MED ORDER — ERTAPENEM SODIUM 1 G IJ SOLR
1.0000 g | INTRAMUSCULAR | Status: AC
  Administered 2013-12-01: 1 g via INTRAVENOUS
  Filled 2013-12-01: qty 1

## 2013-12-01 MED ORDER — LACTATED RINGERS IV SOLN
INTRAVENOUS | Status: DC | PRN
  Administered 2013-12-01: 07:00:00 via INTRAVENOUS

## 2013-12-01 MED ORDER — PROMETHAZINE HCL 25 MG/ML IJ SOLN: 6.2500 mg | INTRAMUSCULAR | Status: DC | PRN

## 2013-12-01 MED ORDER — BUPIVACAINE 0.25 % ON-Q PUMP DUAL CATH 300 ML: 300.0000 mL | INJECTION | Status: DC

## 2013-12-01 MED ORDER — SODIUM CHLORIDE 0.9 % IV SOLN
INTRAVENOUS | Status: AC
  Filled 2013-12-01: qty 1

## 2013-12-01 MED ORDER — PANTOPRAZOLE SODIUM 40 MG IV SOLR
40.0000 mg | Freq: Every day | INTRAVENOUS | Status: DC
  Administered 2013-12-01 – 2013-12-06 (×6): 40 mg via INTRAVENOUS
  Filled 2013-12-01 (×5): qty 40

## 2013-12-01 MED ORDER — BUPIVACAINE 0.25 % ON-Q PUMP DUAL CATH 300 ML
300.0000 mL | INJECTION | Status: DC
  Filled 2013-12-01: qty 300

## 2013-12-01 MED ORDER — CHLORHEXIDINE GLUCONATE 4 % EX LIQD: 1.0000 "application " | Freq: Once | CUTANEOUS | Status: DC

## 2013-12-01 MED ORDER — ONDANSETRON HCL 4 MG/2ML IJ SOLN
4.0000 mg | Freq: Four times a day (QID) | INTRAMUSCULAR | Status: DC | PRN
Start: 2013-12-01 — End: 2013-12-08
  Administered 2013-12-01: 4 mg via INTRAVENOUS

## 2013-12-01 MED ORDER — BUPIVACAINE ON-Q PAIN PUMP (FOR ORDER SET NO CHG)
INJECTION | Status: AC
  Filled 2013-12-01: qty 1

## 2013-12-01 MED ORDER — DIPHENHYDRAMINE HCL 50 MG/ML IJ SOLN
12.5000 mg | Freq: Four times a day (QID) | INTRAMUSCULAR | Status: DC | PRN
  Administered 2013-12-05 – 2013-12-08 (×4): 12.5 mg via INTRAVENOUS
  Filled 2013-12-01 (×4): qty 1

## 2013-12-01 MED ORDER — NALOXONE HCL 0.4 MG/ML IJ SOLN: 0.4000 mg | INTRAMUSCULAR | Status: DC | PRN

## 2013-12-01 MED ORDER — ONDANSETRON HCL 4 MG/2ML IJ SOLN
INTRAMUSCULAR | Status: DC | PRN
  Administered 2013-12-01: 4 mg via INTRAVENOUS

## 2013-12-01 MED ORDER — DEXAMETHASONE SODIUM PHOSPHATE 10 MG/ML IJ SOLN
INTRAMUSCULAR | Status: AC
  Filled 2013-12-01: qty 1

## 2013-12-01 MED ORDER — ROCURONIUM BROMIDE 100 MG/10ML IV SOLN
INTRAVENOUS | Status: AC
  Filled 2013-12-01: qty 1

## 2013-12-01 MED ORDER — MIDAZOLAM HCL 5 MG/5ML IJ SOLN
INTRAMUSCULAR | Status: DC | PRN
  Administered 2013-12-01: 2 mg via INTRAVENOUS

## 2013-12-01 MED ORDER — DIPHENHYDRAMINE HCL 12.5 MG/5ML PO ELIX
12.5000 mg | ORAL_SOLUTION | Freq: Four times a day (QID) | ORAL | Status: DC | PRN
Start: 2013-12-01 — End: 2013-12-08
  Administered 2013-12-05: 12.5 mg via ORAL
  Filled 2013-12-01: qty 5

## 2013-12-01 MED ORDER — LABETALOL HCL 5 MG/ML IV SOLN
INTRAVENOUS | Status: AC
  Filled 2013-12-01: qty 4

## 2013-12-01 MED ORDER — NEOSTIGMINE METHYLSULFATE 1 MG/ML IJ SOLN
INTRAMUSCULAR | Status: DC | PRN
  Administered 2013-12-01: 5 mg via INTRAVENOUS

## 2013-12-01 MED ORDER — MIDAZOLAM HCL 2 MG/2ML IJ SOLN
INTRAMUSCULAR | Status: AC
  Filled 2013-12-01: qty 2

## 2013-12-01 MED ORDER — FENTANYL CITRATE 0.05 MG/ML IJ SOLN
INTRAMUSCULAR | Status: DC | PRN
  Administered 2013-12-01 (×8): 50 ug via INTRAVENOUS
  Administered 2013-12-01: 100 ug via INTRAVENOUS
  Administered 2013-12-01 (×4): 50 ug via INTRAVENOUS

## 2013-12-01 MED ORDER — HYDROMORPHONE HCL PF 1 MG/ML IJ SOLN
INTRAMUSCULAR | Status: AC
  Filled 2013-12-01: qty 1

## 2013-12-01 MED ORDER — PROPOFOL 10 MG/ML IV BOLUS
INTRAVENOUS | Status: DC | PRN
  Administered 2013-12-01: 200 mg via INTRAVENOUS

## 2013-12-01 MED ORDER — LIDOCAINE HCL (CARDIAC) 20 MG/ML IV SOLN
INTRAVENOUS | Status: AC
  Filled 2013-12-01: qty 5

## 2013-12-01 MED ORDER — ONDANSETRON HCL 4 MG/2ML IJ SOLN
INTRAMUSCULAR | Status: AC
  Filled 2013-12-01: qty 2

## 2013-12-01 MED ORDER — ENOXAPARIN SODIUM 40 MG/0.4ML ~~LOC~~ SOLN
40.0000 mg | SUBCUTANEOUS | Status: DC
  Administered 2013-12-02 – 2013-12-09 (×8): 40 mg via SUBCUTANEOUS
  Filled 2013-12-01 (×8): qty 0.4

## 2013-12-01 MED ORDER — MORPHINE SULFATE (PF) 1 MG/ML IV SOLN
INTRAVENOUS | Status: DC
  Administered 2013-12-01: 13:00:00 via INTRAVENOUS
  Administered 2013-12-01: 19.5 mL via INTRAVENOUS
  Administered 2013-12-01: 21:00:00 via INTRAVENOUS
  Administered 2013-12-01: 7.5 mg via INTRAVENOUS
  Administered 2013-12-02: 19.5 mL via INTRAVENOUS
  Administered 2013-12-02: 18:00:00 via INTRAVENOUS
  Administered 2013-12-02: 7.5 mL via INTRAVENOUS
  Administered 2013-12-02: 1.5 mg via INTRAVENOUS
  Administered 2013-12-02 (×2): via INTRAVENOUS
  Administered 2013-12-03: 10.44 mg via INTRAVENOUS
  Administered 2013-12-03: 42.99 mL via INTRAVENOUS
  Administered 2013-12-03: 13.5 mg via INTRAVENOUS
  Administered 2013-12-03: 9 mL via INTRAVENOUS
  Administered 2013-12-03 (×2): via INTRAVENOUS
  Administered 2013-12-03: 20.54 mg via INTRAVENOUS
  Administered 2013-12-03: 18 mL via INTRAVENOUS
  Administered 2013-12-03: 14:00:00 via INTRAVENOUS
  Administered 2013-12-04: 14.29 mg via INTRAVENOUS
  Administered 2013-12-04: 9 mg via INTRAVENOUS
  Administered 2013-12-04 (×2): via INTRAVENOUS
  Administered 2013-12-04: 6 mg via INTRAVENOUS
  Administered 2013-12-04: 24 mL via INTRAVENOUS
  Administered 2013-12-05: 9 mg via INTRAVENOUS
  Administered 2013-12-05: 1.5 mg via INTRAVENOUS
  Administered 2013-12-06: 7.5 mg via INTRAVENOUS
  Administered 2013-12-06: 4.5 mg via INTRAVENOUS
  Administered 2013-12-06: 10.5 mg via INTRAVENOUS
  Administered 2013-12-06: 4.5 mg via INTRAVENOUS
  Administered 2013-12-06 (×2): via INTRAVENOUS
  Administered 2013-12-06: 3 mg via INTRAVENOUS
  Administered 2013-12-06: 1.5 mg via INTRAVENOUS
  Administered 2013-12-07: 8.31 mg via INTRAVENOUS
  Administered 2013-12-07: 9 mg via INTRAVENOUS
  Filled 2013-12-01 (×13): qty 25

## 2013-12-01 MED ORDER — GLYCOPYRROLATE 0.2 MG/ML IJ SOLN
INTRAMUSCULAR | Status: DC | PRN
  Administered 2013-12-01: .8 mg via INTRAVENOUS

## 2013-12-01 MED ORDER — LACTATED RINGERS IV SOLN
INTRAVENOUS | Status: DC
Start: 2013-12-01 — End: 2013-12-01

## 2013-12-01 MED ORDER — LIDOCAINE HCL (CARDIAC) 20 MG/ML IV SOLN
INTRAVENOUS | Status: DC | PRN
  Administered 2013-12-01: 80 mg via INTRAVENOUS

## 2013-12-01 MED ORDER — TISSEEL VH 10 ML EX KIT
PACK | CUTANEOUS | Status: DC | PRN
  Administered 2013-12-01: 1

## 2013-12-01 MED ORDER — SODIUM CHLORIDE 0.9 % IV SOLN
1.0000 g | INTRAVENOUS | Status: AC
  Administered 2013-12-02: 1 g via INTRAVENOUS
  Filled 2013-12-01: qty 1

## 2013-12-01 MED ORDER — GLYCOPYRROLATE 0.2 MG/ML IJ SOLN
INTRAMUSCULAR | Status: AC
  Filled 2013-12-01: qty 4

## 2013-12-01 MED ORDER — 0.9 % SODIUM CHLORIDE (POUR BTL) OPTIME
TOPICAL | Status: DC | PRN
  Administered 2013-12-01: 4000 mL

## 2013-12-01 MED ORDER — HYDROMORPHONE HCL PF 1 MG/ML IJ SOLN
0.2500 mg | INTRAMUSCULAR | Status: DC | PRN
  Administered 2013-12-01 (×2): 0.25 mg via INTRAVENOUS

## 2013-12-01 MED ORDER — SODIUM CHLORIDE 0.9 % IJ SOLN: 9.0000 mL | INTRAMUSCULAR | Status: DC | PRN

## 2013-12-01 MED ORDER — LIDOCAINE HCL (PF) 1 % IJ SOLN
INTRAMUSCULAR | Status: DC | PRN
  Administered 2013-12-01: 1.5 mL

## 2013-12-01 MED ORDER — DEXAMETHASONE SODIUM PHOSPHATE 10 MG/ML IJ SOLN
INTRAMUSCULAR | Status: DC | PRN
  Administered 2013-12-01: 10 mg via INTRAVENOUS

## 2013-12-01 MED ORDER — HYDROMORPHONE HCL PF 2 MG/ML IJ SOLN
INTRAMUSCULAR | Status: AC
  Filled 2013-12-01: qty 1

## 2013-12-01 MED ORDER — BUPIVACAINE-EPINEPHRINE 0.25% -1:200000 IJ SOLN
INTRAMUSCULAR | Status: AC
  Filled 2013-12-01: qty 1

## 2013-12-01 MED ORDER — ONDANSETRON HCL 4 MG PO TABS: 4.0000 mg | ORAL_TABLET | Freq: Four times a day (QID) | ORAL | Status: DC | PRN

## 2013-12-01 MED ORDER — LIDOCAINE HCL 1 % IJ SOLN
INTRAMUSCULAR | Status: AC
  Filled 2013-12-01: qty 40

## 2013-12-01 MED ORDER — LABETALOL HCL 5 MG/ML IV SOLN
INTRAVENOUS | Status: DC | PRN
  Administered 2013-12-01: 5 mg via INTRAVENOUS

## 2013-12-01 MED ORDER — TISSEEL VH 10 ML EX KIT
PACK | CUTANEOUS | Status: AC
  Filled 2013-12-01: qty 2

## 2013-12-01 MED ORDER — MORPHINE SULFATE 2 MG/ML IJ SOLN: 1.0000 mg | INTRAMUSCULAR | Status: DC | PRN

## 2013-12-01 MED ORDER — ONDANSETRON HCL 4 MG/2ML IJ SOLN
4.0000 mg | Freq: Four times a day (QID) | INTRAMUSCULAR | Status: DC | PRN
  Filled 2013-12-01: qty 2

## 2013-12-01 MED ORDER — KCL IN DEXTROSE-NACL 20-5-0.45 MEQ/L-%-% IV SOLN
INTRAVENOUS | Status: DC
  Administered 2013-12-01 – 2013-12-06 (×10): via INTRAVENOUS
  Filled 2013-12-01 (×13): qty 1000

## 2013-12-01 MED ORDER — MORPHINE SULFATE (PF) 1 MG/ML IV SOLN
INTRAVENOUS | Status: AC
  Filled 2013-12-01: qty 25

## 2013-12-01 MED ORDER — NEOSTIGMINE METHYLSULFATE 1 MG/ML IJ SOLN
INTRAMUSCULAR | Status: AC
  Filled 2013-12-01: qty 10

## 2013-12-01 MED ORDER — ROCURONIUM BROMIDE 100 MG/10ML IV SOLN
INTRAVENOUS | Status: DC | PRN
  Administered 2013-12-01: 20 mg via INTRAVENOUS
  Administered 2013-12-01 (×2): 10 mg via INTRAVENOUS
  Administered 2013-12-01: 20 mg via INTRAVENOUS
  Administered 2013-12-01: 50 mg via INTRAVENOUS

## 2013-12-01 MED ORDER — LACTATED RINGERS IV SOLN
INTRAVENOUS | Status: DC | PRN
  Administered 2013-12-01 (×4): via INTRAVENOUS

## 2013-12-01 SURGICAL SUPPLY — 131 items
BENZOIN TINCTURE PRP APPL 2/3 (GAUZE/BANDAGES/DRESSINGS) IMPLANT
BLADE EXTENDED COATED 6.5IN (ELECTRODE) ×3 IMPLANT
BLADE HEX COATED 2.75 (ELECTRODE) ×3 IMPLANT
BLADE SURG SZ10 CARB STEEL (BLADE) IMPLANT
BOOT SUTURE VASCULAR YLW (MISCELLANEOUS) ×1
CABLE HI FREQUENCY MONOPOLAR (ELECTROSURGICAL) IMPLANT
CANISTER SUCTION 2500CC (MISCELLANEOUS) IMPLANT
CATH FOLEY 2WAY SLVR  5CC 16FR (CATHETERS) ×1
CATH FOLEY 2WAY SLVR 5CC 16FR (CATHETERS) ×2 IMPLANT
CATH KIT ON Q 7.5IN SLV (PAIN MANAGEMENT) IMPLANT
CATH ROBINSON RED A/P 12FR (CATHETERS) IMPLANT
CATH ROBINSON RED A/P 14FR (CATHETERS) IMPLANT
CATH ROBINSON RED A/P 16FR (CATHETERS) IMPLANT
CATH ROBINSON RED A/P 18FR (CATHETERS) IMPLANT
CATH ROBINSON RED A/P 20FR (CATHETERS) IMPLANT
CHLORAPREP W/TINT 26ML (MISCELLANEOUS) ×3 IMPLANT
CLAMP SUTURE YELLOW 5 PAIRS (MISCELLANEOUS) ×2 IMPLANT
CLIP LIGATING HEM O LOK PURPLE (MISCELLANEOUS) ×6 IMPLANT
CLIP LIGATING HEMO O LOK GREEN (MISCELLANEOUS) ×6 IMPLANT
CLIP LIGATING HEMOLOK MED (MISCELLANEOUS) ×6 IMPLANT
CLIP TI LARGE 6 (CLIP) ×3 IMPLANT
CLIP TI MEDIUM 6 (CLIP) ×6 IMPLANT
CONT SPECI 4OZ STER CLIK (MISCELLANEOUS) IMPLANT
CUTTER FLEX LINEAR 45M (STAPLE) IMPLANT
DECANTER SPIKE VIAL GLASS SM (MISCELLANEOUS) ×3 IMPLANT
DERMABOND ADVANCED (GAUZE/BANDAGES/DRESSINGS)
DERMABOND ADVANCED .7 DNX12 (GAUZE/BANDAGES/DRESSINGS) IMPLANT
DISSECTOR ROUND CHERRY 3/8 STR (MISCELLANEOUS) IMPLANT
DRAIN CHANNEL 19F RND (DRAIN) ×6 IMPLANT
DRAPE C-ARM 42X120 X-RAY (DRAPES) IMPLANT
DRAPE CAMERA CLOSED 9X96 (DRAPES) ×3 IMPLANT
DRAPE LAPAROSCOPIC ABDOMINAL (DRAPES) IMPLANT
DRAPE LG THREE QUARTER DISP (DRAPES) IMPLANT
DRAPE TOWEL STR TPT 18X26 WHT (DRAPES) ×6 IMPLANT
DRAPE UTILITY XL STRL (DRAPES) ×6 IMPLANT
DRAPE WARM FLUID 44X44 (DRAPE) ×3 IMPLANT
DRESSING TELFA ISLAND 4X8 (GAUZE/BANDAGES/DRESSINGS) IMPLANT
DRSG TELFA 4X10 ISLAND STR (GAUZE/BANDAGES/DRESSINGS) ×3 IMPLANT
DRSG TELFA PLUS 4X6 ADH ISLAND (GAUZE/BANDAGES/DRESSINGS) IMPLANT
ELECT REM PT RETURN 9FT ADLT (ELECTROSURGICAL) ×3
ELECTRODE REM PT RTRN 9FT ADLT (ELECTROSURGICAL) ×2 IMPLANT
ENDOLOOP SUT PDS II  0 18 (SUTURE)
ENDOLOOP SUT PDS II 0 18 (SUTURE) IMPLANT
EVACUATOR SILICONE 100CC (DRAIN) IMPLANT
GLOVE BIO SURGEON STRL SZ 6 (GLOVE) ×3 IMPLANT
GLOVE INDICATOR 6.5 STRL GRN (GLOVE) ×6 IMPLANT
GOWN SPEC L3 XXLG W/TWL (GOWN DISPOSABLE) ×3 IMPLANT
GOWN STRL REIN 2XL LVL4 (GOWN DISPOSABLE) ×3 IMPLANT
GOWN STRL REUS W/TWL 2XL LVL3 (GOWN DISPOSABLE) ×3 IMPLANT
GOWN STRL REUS W/TWL XL LVL3 (GOWN DISPOSABLE) ×9 IMPLANT
HEMOSTAT SURGICEL 4X8 (HEMOSTASIS) IMPLANT
KIT BASIN OR (CUSTOM PROCEDURE TRAY) ×3 IMPLANT
LOOP MINI RED (MISCELLANEOUS) IMPLANT
LOOP VESSEL MAXI BLUE (MISCELLANEOUS) ×3 IMPLANT
MANIFOLD NEPTUNE II (INSTRUMENTS) ×3 IMPLANT
NEEDLE BIOPSY 14GX4.5 SOFT TIS (NEEDLE) IMPLANT
NEEDLE HYPO 22GX1.5 SAFETY (NEEDLE) ×3 IMPLANT
PACK GENERAL/GYN (CUSTOM PROCEDURE TRAY) ×3 IMPLANT
PACK UNIVERSAL I (CUSTOM PROCEDURE TRAY) ×3 IMPLANT
PAD ABD 8X10 STRL (GAUZE/BANDAGES/DRESSINGS) IMPLANT
PLUG CATH AND CAP STER (CATHETERS) IMPLANT
RELOAD PROXIMATE 75MM BLUE (ENDOMECHANICALS) ×9 IMPLANT
SCALPEL HARMONIC ACE (MISCELLANEOUS) IMPLANT
SEPRAFILM PROCEDURAL PACK 3X5 (MISCELLANEOUS) IMPLANT
SET IRRIG TUBING LAPAROSCOPIC (IRRIGATION / IRRIGATOR) ×3 IMPLANT
SHEARS FOC LG CVD HARMONIC 17C (MISCELLANEOUS) ×3 IMPLANT
SLEEVE SURGEON STRL (DRAPES) IMPLANT
SLEEVE XCEL OPT CAN 5 100 (ENDOMECHANICALS) ×3 IMPLANT
SOLUTION ANTI FOG 6CC (MISCELLANEOUS) ×3 IMPLANT
SPONGE DRAIN TRACH 4X4 STRL 2S (GAUZE/BANDAGES/DRESSINGS) ×3 IMPLANT
SPONGE GAUZE 4X4 12PLY (GAUZE/BANDAGES/DRESSINGS) IMPLANT
SPONGE LAP 18X18 X RAY DECT (DISPOSABLE) ×9 IMPLANT
STAPLER PROXIMATE 75MM BLUE (STAPLE) ×3 IMPLANT
STAPLER VISISTAT 35W (STAPLE) ×3 IMPLANT
STRIP CLOSURE SKIN 1/2X4 (GAUZE/BANDAGES/DRESSINGS) IMPLANT
SUCTION POOLE TIP (SUCTIONS) ×3 IMPLANT
SUT 5.0 PDS RB-1 (SUTURE)
SUT CHROMIC 3 0 SH 27 (SUTURE) IMPLANT
SUT CHROMIC 4 0 RB 1X27 (SUTURE) IMPLANT
SUT ETHILON 1 TP 1 60 (SUTURE) ×3 IMPLANT
SUT ETHILON 2 0 PS N (SUTURE) ×6 IMPLANT
SUT MNCRL AB 4-0 PS2 18 (SUTURE) IMPLANT
SUT PDS AB 1 TP1 54 (SUTURE) IMPLANT
SUT PDS AB 1 TP1 96 (SUTURE) ×6 IMPLANT
SUT PDS AB 3-0 SH 27 (SUTURE) ×12 IMPLANT
SUT PDS AB 4-0 RB1 27 (SUTURE) ×21 IMPLANT
SUT PDS PLUS AB 5-0 RB-1 (SUTURE) IMPLANT
SUT PROLENE 3 0 SH 48 (SUTURE) ×6 IMPLANT
SUT PROLENE 3 0 SH1 36 (SUTURE) IMPLANT
SUT PROLENE 4 0 RB 1 (SUTURE) ×3
SUT PROLENE 4-0 RB1 .5 CRCL 36 (SUTURE) ×6 IMPLANT
SUT PROLENE 5 0 CC 1 (SUTURE) IMPLANT
SUT SILK 0 FSL (SUTURE) ×3 IMPLANT
SUT SILK 2 0 (SUTURE) ×1
SUT SILK 2 0 SH (SUTURE) ×3 IMPLANT
SUT SILK 2 0 SH CR/8 (SUTURE) ×6 IMPLANT
SUT SILK 2-0 18XBRD TIE 12 (SUTURE) ×2 IMPLANT
SUT SILK 3 0 (SUTURE)
SUT SILK 3 0 SH CR/8 (SUTURE) ×3 IMPLANT
SUT SILK 3-0 18XBRD TIE 12 (SUTURE) IMPLANT
SUT VIC AB 3-0 SH 18 (SUTURE) ×3 IMPLANT
SUT VIC AB 4-0 PS2 27 (SUTURE) IMPLANT
SUT VIC AB 4-0 RB1 27 (SUTURE) ×1
SUT VIC AB 4-0 RB1 27XBRD (SUTURE) ×2 IMPLANT
SUT VIC AB 4-0 SH 18 (SUTURE) IMPLANT
SUT VICRYL 0 UR6 27IN ABS (SUTURE) ×3 IMPLANT
SUT VICRYL 2 0 18  UND BR (SUTURE) ×1
SUT VICRYL 2 0 18 UND BR (SUTURE) ×2 IMPLANT
SUT VICRYL 3 0 BR 18  UND (SUTURE) ×1
SUT VICRYL 3 0 BR 18 UND (SUTURE) ×2 IMPLANT
SYR 20CC LL (SYRINGE) ×3 IMPLANT
SYRINGE 10CC LL (SYRINGE) IMPLANT
SYS LAPSCP GELPORT 120MM (MISCELLANEOUS)
SYSTEM LAPSCP GELPORT 120MM (MISCELLANEOUS) IMPLANT
TAG SUTURE CLAMP YLW 5PR (MISCELLANEOUS) ×2
TAPE CLOTH SURG 4X10 WHT LF (GAUZE/BANDAGES/DRESSINGS) ×3 IMPLANT
TAPE UMBILICAL COTTON 1/8X30 (MISCELLANEOUS) IMPLANT
TOWEL OR 17X26 10 PK STRL BLUE (TOWEL DISPOSABLE) ×6 IMPLANT
TOWEL OR NON WOVEN STRL DISP B (DISPOSABLE) ×6 IMPLANT
TRAY FOLEY CATH 14FRSI W/METER (CATHETERS) IMPLANT
TRAY LAP CHOLE (CUSTOM PROCEDURE TRAY) IMPLANT
TROCAR BLADELESS OPT 5 75 (ENDOMECHANICALS) IMPLANT
TROCAR XCEL BLUNT TIP 100MML (ENDOMECHANICALS) ×3 IMPLANT
TROCAR XCEL NON-BLD 11X100MML (ENDOMECHANICALS) IMPLANT
TROCAR XCEL UNIV SLVE 11M 100M (ENDOMECHANICALS) IMPLANT
TUBE FEEDING 5FR 36IN KANGAROO (TUBING) ×3 IMPLANT
TUBE FEEDING 8FR 16IN STR KANG (MISCELLANEOUS) ×3 IMPLANT
TUBING INSUFFLATION 10FT LAP (TUBING) ×3 IMPLANT
TUNNELER SHEATH ON-Q 16GX12 DP (PAIN MANAGEMENT) IMPLANT
WATER STERILE IRR 1500ML POUR (IV SOLUTION) IMPLANT
YANKAUER SUCT BULB TIP NO VENT (SUCTIONS) IMPLANT

## 2013-12-01 NOTE — Interval H&P Note (Signed)
History and Physical Interval Note:  12/01/2013 7:25 AM  Brent Rogers  has presented today for surgery, with the diagnosis of pancreatic mass  The various methods of treatment have been discussed with the patient and family. After consideration of risks, benefits and other options for treatment, the patient has consented to  Procedure(s): LAPAROSCOPY DIAGNOSTIC (N/A) POSSIBLE WHIPPLE PROCEDURE (N/A) as a surgical intervention .  The patient's history has been reviewed, patient examined, no change in status, stable for surgery.  I have reviewed the patient's chart and labs.  Questions were answered to the patient's satisfaction.     [name]

## 2013-12-01 NOTE — Op Note (Signed)
PREOPERATIVE DIAGNOSIS: malignant pancreatic head mass  POSTOPERATIVE DIAGNOSIS: Same.   PROCEDURES PERFORMED:  Diagnostic laparoscopy  Classic pancreaticoduodenectomy   Placement of pancreatic and biliary stent    SURGEON: Stark Klein, MD   ASSISTANT: Alphonsa Overall, MD   ANESTHESIA: General and epidural   FINDINGS: 3 cm pancreatic head mass. soft pancreatic tissue. 5 mm common bile duct. 1 mm pancreatic duct  SPECIMENS:  1. Pancreaticoduodenectomy with gallbladder:  2. portal nodes   ESTIMATED BLOOD LOSS: 150 mL.   COMPLICATIONS: None known.   PROCEDURE:   Pt was identified in the holding area and taken to  the operating room, and placed supine on the operating room  table. General anesthesia was induced. The patient's abdomen was  prepped and draped in a sterile fashion, after a Foley catheter was  placed. A time-out was performed according to the surgical safety check  list. When all was correct we continued.   The patient was placed in reverse trendelenburg position and rotated to the right.  The left subcostal margin was anesthetized with local anesthesia.  A Hasson trocar was place in the supraumbilical location in standard fashion. The abdomen was insufflated with carbon dioxide.  The abdomen was examined. No evidence of metastatic disease was seen.    A midline incision was made from the xiphoid to just below the umbilicus.  The subcutaneous tissues were divided with the Bovie cautery. The peritoneum was entered in the center of the abdomen. Digital retraction was then used to elevate the preperitoneal fat, and  this was taken with the cautery as well. The subcutaneous tissues and  fascia of the muscular layers were taken laterally with the cautery. The falciform ligament was divided with the Harmonic scalpel.   Care was taken to protect the underlying viscera.    Bookwalter self-retaining retractor was placed  for visualization. The right colon was taken down off  of the white line  of Toldt and from the retroperitoneum at the hepatic flexure. The porta was identified. The  duodenum was kocherized extensively with blunt dissection and with cautery. The gallbladder was taken off the liver with a combination of blunt dissection and cautery. The cystic duct was clipped with the Hemalock clips. The cystic duct was divided and the gallbladder was passed off.   The common bile duct was skeletonized near the duodenum. A vessel loop was passed around it. The , as well as the common hepatic artery were skeletonized. A very diminuitive GDA was identified.  The proper hepatic artery was traced out to make sure that flow was going to both sides of the liver when the GDA was clamped. The GDA was divided with clips and harmonic.   The proper hepatic artery was reflected upward, and the anterior portal vein was exposed.  A Kelly clamp was passed underneath the pancreas at the superior mesenteric vein, and this passed  easily with no signs of tumor involvement.   Attention was then directed to the stomach, and the omentum was taken  off of the stomach at the border of the antrum and the body. The  gastrohepatic ligament was taken down with the harmonic, and care was  taken to make sure there was not a replaced left hepatic artery in this  location. The stomach was divided with the GIA-75 stapler. The border  of the stomach was oversewn with a 3-0 running PDS suture.   Attention was then directed to the small bowel. Around 10 cm past the  ligament of  Treitz it was located, and this was divided with the 75-GIA.  The distal portion of the jejunum was also oversewn with a 3-0 PDS  suture. The fourth portion of the duodenum was skeletonized with the  harmonic scalpel, taking down all of the mesenteric vessels. The  ligament of Treitz was taken down. The IMV was preserved.  The duodenum was then passed underneath the portal vein.   At this point the Claiborne Billings was replaced and  the pancreas was divided with the cautery.  The Bovie was used to coagulate the small bleeders at the border of the pancreas.  The Overholt in combination with the harmonic and locking Weck clips  were then used to take the uncinate process off of the portal vein and  the superior mesenteric artery. Care was taken not to incorporate the  superior mesenteric artery in the dissection. The specimen was then marked and passed off the table for frozen section margin.   The jejunum was then passed through a defect in the right mesocolon in order to get appropriate lie for the pancreatic and biliary  anastomoses. The more distal portion (around 20 cm distal) of the jejunum was pulled up over  the colon, and two 3-0 silks were placed through the posterior border  of the stomach for the gastrojejunostomy. The stomach and the small  bowel were opened, and a GIA-75 was used to create an end-to-end  anastomosis. The open areas of the staple line were examined to ensure  that there was hemostasis. The defect was then closed with a single  layer of running Connell suture of 3-0 PDS. Prior to a complete  closure, the NG tube was passed toward the afferent limb.   The appropriate location for the choledochojejunostomy was identified, and  the small bowel was opened approximately 5 mm. The anastamosis was created with approximately seven 4-0 interrupted PDS sutures.   Due to the small size of the bile duct, a 1.5 inch portion of 8 Fr pediatric feeding tube was placed as a biliary stent.  The 2 corner sutures were placed first  and then the posterior layer was done in an interrupted fashion tying on  the inside. The superior layer was then closed with interrupted sutures as  well.   At this point the frozens returned back as  all negative. An invaginated pancreatic anastamosis was created.  The pancreatic anastomosis was then created by opening the jejunum the length  of the pancreatic parenchyma. The pancreas  was soft, and the duct was 1 mm. A 5 Fr pediatric feeding tube was used as a pancreatic stent. The posterior layer was formed first with 2-0 silk sutures in interrupted fashion. The edge of the pancreas was sutured to the mucosal border with a running 4-0 vicryl.  An anterior layer of 2-0 silks was placed in order to dunk the edge of the pancreas.   The areas were then irrigated and then those anastomoses were covered  with Tisseel. This was allowed to dry.   The abdomen was then irrigated  again and all the laparotomy sponges were removed. A lap count was  performed, which was correct. Two 19-Blake drains were placed, with the  lateral-most drain placed behind the choledochojejunostomy. The medial  Blake drain was placed just anterior and slightly superior to the  Pancreaticojejunostomy.  OnQ catheters were placed in the preperitoneal space.  The fascia was then closed with #1 looped running PDS sutures. The skin was irrigated and then closed with  staples. The wounds were cleaned, dried and dressed with a sterile  dressing.   A needle was unaccounted for, so an abdominal xray was obtained.  In between the time of the film and the read, the needle was located.    The patient tolerated the procedure well and was extubated and taken to  PACU in stable condition. Needle and sponge counts were correct x2.

## 2013-12-01 NOTE — H&P (View-Only) (Signed)
Chief Complaint  Patient presents with  . New Evaluation    pancreatic mass    HISTORY: Patient is a 48 year old male who came to medical attention in December when he started having epigastric abdominal pain and nausea. He was seen in the emergency department and had a questionable mass in the head of his pancreas on CT. Dr. Benson Norway performed an endoscopic ultrasound and also saw a mass.  Biopsies are nondiagnostic. He is scheduled for followup. In the intervening time, he went to the emergency department and underwent an MRI. This also confirmed the presence of the mass. He denies any significant weight loss other than maybe 5 pounds. He does not have any diarrhea. His father does take Creon for pancreatic insufficiency and so he is familiar with this medication.  His mother had a form of intra-abdominal cancer but he is not sure what. She passed away 9 years ago.  Past Medical History  Diagnosis Date  . Scoliosis   . Pancreatic abnormality     CT  shows mass    Past Surgical History  Procedure Laterality Date  . Lumbar disc surgery  1990's  . Finger fracture surgery Left 1995    5th digit  . Eus N/A 09/21/2013    Procedure: ESOPHAGEAL ENDOSCOPIC ULTRASOUND (EUS) RADIAL;  Surgeon: Beryle Beams, MD;  Location: [facility];  Service: Endoscopy;  Laterality: N/A;  . Fine needle aspiration N/A 09/21/2013    Procedure: FINE NEEDLE ASPIRATION (FNA) LINEAR;  Surgeon: Beryle Beams, MD;  Location: [facility];  Service: Endoscopy;  Laterality: N/A;  . Back surgery  yrs ago    lower  . Eus N/A 09/30/2013    Procedure: UPPER ENDOSCOPIC ULTRASOUND (EUS) LINEAR;  Surgeon: Beryle Beams, MD;  Location: [facility];  Service: Endoscopy;  Laterality: N/A;    Current Outpatient Prescriptions  Medication Sig Dispense Refill  . acetaminophen (TYLENOL) 325 MG tablet Take 2 tablets (650 mg total) by mouth every 6 (six) hours as needed for mild pain (or Fever >/= 101).      Marland Kitchen acetaminophen-codeine  (TYLENOL #3) 300-30 MG per tablet Take by mouth every 4 (four) hours as needed for moderate pain.      Marland Kitchen bismuth subsalicylate (PEPTO BISMOL) 262 MG chewable tablet Chew 524 mg by mouth as needed for indigestion or diarrhea or loose stools.      Marland Kitchen omeprazole (PRILOSEC) 40 MG capsule Take 1 capsule (40 mg total) by mouth 2 (two) times daily.  60 capsule  0  . oxyCODONE (ROXICODONE) 5 MG immediate release tablet Take 1 tablet (5 mg total) by mouth every 6 (six) hours as needed for severe pain.  20 tablet  0   No current facility-administered medications for this visit.     No Known Allergies   Family History  Problem Relation Age of Onset  . Cancer Mother     unsure     History   Social History  . Marital Status: Single    Spouse Name: N/A    Number of Children: N/A  . Years of Education: N/A   Social History Main Topics  . Smoking status: Current Every Day Smoker -- 0.25 packs/day for 10 years    Types: Cigarettes  . Smokeless tobacco: Former Systems developer    Types: Snuff     Comment: 09/21/2013 "smoke < 1 pack cigarettes q 2 wks"  . Alcohol Use: Yes     Comment: 09/21/2013 "drink a beer couple times/month"  . Drug  Use: No  . Sexual Activity: Yes    REVIEW OF SYSTEMS - PERTINENT POSITIVES ONLY: 12 point review of systems negative other than HPI and PMH  EXAM: Filed Vitals:   11/07/13 0851  BP: 136/72  Pulse: 76  Temp: 98 F (36.7 C)  Resp: 18    Wt Readings from Last 3 Encounters:  11/07/13 166 lb (75.297 kg)  11/02/13 159 lb 3.2 oz (72.213 kg)  09/20/13 158 lb 9.6 oz (71.94 kg)     Gen:  No acute distress.  Thin, but well groomed.  Smells of smoke.   Neurological: Alert and oriented to person, place, and time. Coordination normal.  Head: Normocephalic and atraumatic.  Eyes: Conjunctivae are normal. Pupils are equal, round, and reactive to light. No scleral icterus.  Neck: Normal range of motion. Neck supple. No tracheal deviation or thyromegaly present.   Cardiovascular: Normal rate, regular rhythm, normal heart sounds and intact distal pulses.  Exam reveals no gallop and no friction rub.  No murmur heard. Respiratory: Effort normal.  No respiratory distress. No chest wall tenderness. Breath sounds normal.  No wheezes, rales or rhonchi.  GI: Soft. Bowel sounds are normal. The abdomen is soft and nontender.  There is no rebound and no guarding. no abdominal scars. Musculoskeletal: Normal range of motion. Extremities are nontender.  Lymphadenopathy: No cervical, preauricular, postauricular or axillary adenopathy is present Skin: Skin is warm and dry. No rash noted. No diaphoresis. No erythema. No pallor. No clubbing, cyanosis, or edema.   Psychiatric: Normal mood and affect. Behavior is normal. Judgment and thought content normal.    LABORATORY RESULTS: Available labs are reviewed  Cytology from EUS - no malignant cells seen.  Recent Results (from the past 2160 hour(s))  CBC WITH DIFFERENTIAL     Status: Abnormal   Collection Time    09/18/13 12:37 PM      Result Value Range   WBC 12.2 (*) 4.0 - 10.5 K/uL   RBC 5.79  4.22 - 5.81 MIL/uL   Hemoglobin 17.7 (*) 13.0 - 17.0 g/dL   HCT 51.2  39.0 - 52.0 %   MCV 88.4  78.0 - 100.0 fL   MCH 30.6  26.0 - 34.0 pg   MCHC 34.6  30.0 - 36.0 g/dL   RDW 13.8  11.5 - 15.5 %   Platelets 232  150 - 400 K/uL   Neutrophils Relative % 74  43 - 77 %   Neutro Abs 9.0 (*) 1.7 - 7.7 K/uL   Lymphocytes Relative 21  12 - 46 %   Lymphs Abs 2.5  0.7 - 4.0 K/uL   Monocytes Relative 5  3 - 12 %   Monocytes Absolute 0.6  0.1 - 1.0 K/uL   Eosinophils Relative 0  0 - 5 %   Eosinophils Absolute 0.0  0.0 - 0.7 K/uL   Basophils Relative 0  0 - 1 %   Basophils Absolute 0.0  0.0 - 0.1 K/uL  COMPREHENSIVE METABOLIC PANEL     Status: Abnormal   Collection Time    09/18/13 12:37 PM      Result Value Range   Sodium 136  135 - 145 mEq/L   Potassium 4.2  3.5 - 5.1 mEq/L   Chloride 100  96 - 112 mEq/L   CO2 22  19 - 32  mEq/L   Glucose, Bld 89  70 - 99 mg/dL   BUN 8  6 - 23 mg/dL   Creatinine, Ser 0.93  0.50 -  1.35 mg/dL   Calcium 9.7  8.4 - 10.5 mg/dL   Total Protein 8.0  6.0 - 8.3 g/dL   Albumin 4.5  3.5 - 5.2 g/dL   AST 21  0 - 37 U/L   ALT 14  0 - 53 U/L   Alkaline Phosphatase 119 (*) 39 - 117 U/L   Total Bilirubin 0.4  0.3 - 1.2 mg/dL   GFR calc non Af Amer >90  >90 mL/min   GFR calc Af Amer >90  >90 mL/min   Comment: (NOTE)     The eGFR has been calculated using the CKD EPI equation.     This calculation has not been validated in all clinical situations.     eGFR's persistently <90 mL/min signify possible Chronic Kidney     Disease.  LIPASE, BLOOD     Status: None   Collection Time    09/18/13 12:37 PM      Result Value Range   Lipase 25  11 - 59 U/L  URINALYSIS, ROUTINE W REFLEX MICROSCOPIC     Status: Abnormal   Collection Time    09/18/13  3:31 PM      Result Value Range   Color, Urine YELLOW  YELLOW   APPearance CLEAR  CLEAR   Specific Gravity, Urine 1.014  1.005 - 1.030   pH 5.0  5.0 - 8.0   Glucose, UA NEGATIVE  NEGATIVE mg/dL   Hgb urine dipstick NEGATIVE  NEGATIVE   Bilirubin Urine NEGATIVE  NEGATIVE   Ketones, ur 15 (*) NEGATIVE mg/dL   Protein, ur NEGATIVE  NEGATIVE mg/dL   Urobilinogen, UA 0.2  0.0 - 1.0 mg/dL   Nitrite NEGATIVE  NEGATIVE   Leukocytes, UA NEGATIVE  NEGATIVE   Comment: MICROSCOPIC NOT DONE ON URINES WITH NEGATIVE PROTEIN, BLOOD, LEUKOCYTES, NITRITE, OR GLUCOSE <1000 mg/dL.  CBC WITH DIFFERENTIAL     Status: None   Collection Time    09/20/13 10:24 AM      Result Value Range   WBC 6.6  4.0 - 10.5 K/uL   RBC 5.49  4.22 - 5.81 MIL/uL   Hemoglobin 16.5  13.0 - 17.0 g/dL   HCT 48.3  39.0 - 52.0 %   MCV 88.0  78.0 - 100.0 fL   MCH 30.1  26.0 - 34.0 pg   MCHC 34.2  30.0 - 36.0 g/dL   RDW 13.8  11.5 - 15.5 %   Platelets 216  150 - 400 K/uL   Neutrophils Relative % 61  43 - 77 %   Neutro Abs 4.0  1.7 - 7.7 K/uL   Lymphocytes Relative 30  12 - 46 %    Lymphs Abs 2.0  0.7 - 4.0 K/uL   Monocytes Relative 8  3 - 12 %   Monocytes Absolute 0.5  0.1 - 1.0 K/uL   Eosinophils Relative 1  0 - 5 %   Eosinophils Absolute 0.1  0.0 - 0.7 K/uL   Basophils Relative 1  0 - 1 %   Basophils Absolute 0.0  0.0 - 0.1 K/uL  COMPREHENSIVE METABOLIC PANEL     Status: Abnormal   Collection Time    09/20/13 10:24 AM      Result Value Range   Sodium 142  135 - 145 mEq/L   Potassium 4.6  3.5 - 5.1 mEq/L   Chloride 103  96 - 112 mEq/L   CO2 27  19 - 32 mEq/L   Glucose, Bld 96  70 - 99  mg/dL   BUN 5 (*) 6 - 23 mg/dL   Creatinine, Ser 0.89  0.50 - 1.35 mg/dL   Calcium 9.4  8.4 - 10.5 mg/dL   Total Protein 7.5  6.0 - 8.3 g/dL   Albumin 4.1  3.5 - 5.2 g/dL   AST 19  0 - 37 U/L   ALT 12  0 - 53 U/L   Alkaline Phosphatase 101  39 - 117 U/L   Total Bilirubin 0.3  0.3 - 1.2 mg/dL   GFR calc non Af Amer >90  >90 mL/min   GFR calc Af Amer >90  >90 mL/min   Comment: (NOTE)     The eGFR has been calculated using the CKD EPI equation.     This calculation has not been validated in all clinical situations.     eGFR's persistently <90 mL/min signify possible Chronic Kidney     Disease.  LIPASE, BLOOD     Status: None   Collection Time    09/20/13 10:24 AM      Result Value Range   Lipase 26  11 - 59 U/L  TROPONIN I     Status: None   Collection Time    09/20/13 10:25 AM      Result Value Range   Troponin I <0.30  <0.30 ng/mL   Comment:            Due to the release kinetics of cTnI,     a negative result within the first hours     of the onset of symptoms does not rule out     myocardial infarction with certainty.     If myocardial infarction is still suspected,     repeat the test at appropriate intervals.  URINALYSIS, ROUTINE W REFLEX MICROSCOPIC     Status: Abnormal   Collection Time    09/20/13  1:53 PM      Result Value Range   Color, Urine YELLOW  YELLOW   APPearance CLOUDY (*) CLEAR   Specific Gravity, Urine 1.020  1.005 - 1.030   pH 5.5  5.0 -  8.0   Glucose, UA NEGATIVE  NEGATIVE mg/dL   Hgb urine dipstick NEGATIVE  NEGATIVE   Bilirubin Urine NEGATIVE  NEGATIVE   Ketones, ur 15 (*) NEGATIVE mg/dL   Protein, ur NEGATIVE  NEGATIVE mg/dL   Urobilinogen, UA 1.0  0.0 - 1.0 mg/dL   Nitrite NEGATIVE  NEGATIVE   Leukocytes, UA NEGATIVE  NEGATIVE   Comment: MICROSCOPIC NOT DONE ON URINES WITH NEGATIVE PROTEIN, BLOOD, LEUKOCYTES, NITRITE, OR GLUCOSE <1000 mg/dL.  CANCER ANTIGEN 19-9     Status: Abnormal   Collection Time    09/21/13  3:45 PM      Result Value Range   CA 19-9 31.0 (*) <35.0 U/mL   Comment: Performed at [facility]  CEA (CARCINOEMBRYONIC ANTIGEN), FLUID     Status: Abnormal   Collection Time    09/30/13 10:30 AM      Result Value Range   CEA Fluid 40.9 (*) <10.0 ng/mL   Comment: (NOTE)     Specimen moderately hemolyzed     This test was performed using the Siemens (Bayer)     Chemiluminescent method. Values obtained from     different assay methods cannot be used inter-     changeably. CEA levels, regardless of value,     should not be interpreted as absolute evidence     of presence or absence of disease.  Performed at [facility]  AMYLASE, BODY FLUID     Status: None   Collection Time    09/30/13 10:33 AM      Result Value Range   Amylase, Fluid >10000     Comment: RESULTS CONFIRMED BY MANUAL DILUTION     Performed at [facility]   Fluid Type-FAMY CYSTS     Comment: PANCREATIC  URINALYSIS, ROUTINE W REFLEX MICROSCOPIC     Status: None   Collection Time    11/02/13  4:43 PM      Result Value Range   Color, Urine YELLOW  YELLOW   APPearance CLEAR  CLEAR   Specific Gravity, Urine 1.005  1.005 - 1.030   pH 5.5  5.0 - 8.0   Glucose, UA NEGATIVE  NEGATIVE mg/dL   Hgb urine dipstick NEGATIVE  NEGATIVE   Bilirubin Urine NEGATIVE  NEGATIVE   Ketones, ur NEGATIVE  NEGATIVE mg/dL   Protein, ur NEGATIVE  NEGATIVE mg/dL   Urobilinogen, UA 0.2  0.0 - 1.0 mg/dL   Nitrite NEGATIVE  NEGATIVE    Leukocytes, UA NEGATIVE  NEGATIVE   Comment: MICROSCOPIC NOT DONE ON URINES WITH NEGATIVE PROTEIN, BLOOD, LEUKOCYTES, NITRITE, OR GLUCOSE <1000 mg/dL.  CBC WITH DIFFERENTIAL     Status: None   Collection Time    11/02/13  4:44 PM      Result Value Range   WBC 8.2  4.0 - 10.5 K/uL   RBC 5.60  4.22 - 5.81 MIL/uL   Hemoglobin 16.6  13.0 - 17.0 g/dL   HCT 48.5  39.0 - 52.0 %   MCV 86.6  78.0 - 100.0 fL   MCH 29.6  26.0 - 34.0 pg   MCHC 34.2  30.0 - 36.0 g/dL   RDW 14.3  11.5 - 15.5 %   Platelets 235  150 - 400 K/uL   Neutrophils Relative % 62  43 - 77 %   Neutro Abs 5.1  1.7 - 7.7 K/uL   Lymphocytes Relative 30  12 - 46 %   Lymphs Abs 2.5  0.7 - 4.0 K/uL   Monocytes Relative 6  3 - 12 %   Monocytes Absolute 0.5  0.1 - 1.0 K/uL   Eosinophils Relative 1  0 - 5 %   Eosinophils Absolute 0.1  0.0 - 0.7 K/uL   Basophils Relative 1  0 - 1 %   Basophils Absolute 0.0  0.0 - 0.1 K/uL  COMPREHENSIVE METABOLIC PANEL     Status: Abnormal   Collection Time    11/02/13  4:44 PM      Result Value Range   Sodium 139  137 - 147 mEq/L   Potassium 3.9  3.7 - 5.3 mEq/L   Chloride 99  96 - 112 mEq/L   CO2 26  19 - 32 mEq/L   Glucose, Bld 84  70 - 99 mg/dL   BUN 5 (*) 6 - 23 mg/dL   Creatinine, Ser 0.88  0.50 - 1.35 mg/dL   Calcium 9.7  8.4 - 10.5 mg/dL   Total Protein 8.1  6.0 - 8.3 g/dL   Albumin 4.4  3.5 - 5.2 g/dL   AST 18  0 - 37 U/L   ALT 17  0 - 53 U/L   Alkaline Phosphatase 102  39 - 117 U/L   Total Bilirubin 0.5  0.3 - 1.2 mg/dL   GFR calc non Af Amer >90  >90 mL/min   GFR calc Af Amer >90  >90  mL/min   Comment: (NOTE)     The eGFR has been calculated using the CKD EPI equation.     This calculation has not been validated in all clinical situations.     eGFR's persistently <90 mL/min signify possible Chronic Kidney     Disease.  LIPASE, BLOOD     Status: None   Collection Time    11/02/13  4:44 PM      Result Value Range   Lipase 30  11 - 59 U/L  POCT I-STAT TROPONIN I      Status: None   Collection Time    11/02/13  4:59 PM      Result Value Range   Troponin i, poc 0.00  0.00 - 0.08 ng/mL   Comment 3            Comment: Due to the release kinetics of cTnI,     a negative result within the first hours     of the onset of symptoms does not rule out     myocardial infarction with certainty.     If myocardial infarction is still suspected,     repeat the test at appropriate intervals.     RADIOLOGY RESULTS: See E-Chart or I-Site for most recent results.  Images and reports are reviewed.  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 No significant bone abnormality identified.  IMPRESSION: No evidence of metallic foreign body within the orbits.   Electronically Signed   By: Sherryl Barters M.D.   On: 10/30/2013 12:04   Mr Abdomen W Wo Contrast  10/30/2013   CLINICAL DATA:  Left-sided abdominal pain.  Pancreatic lesion.  EXAM: MRI ABDOMEN WITHOUT AND WITH CONTRAST  TECHNIQUE: Multiplanar multisequence MR imaging of the abdomen was performed both before and after the administration of intravenous contrast.  CONTRAST:  61m MULTIHANCE GADOBENATE DIMEGLUMINE 529 MG/ML IV SOLN  COMPARISON:  CT ABD/PELVIS W CM dated 09/18/2013  FINDINGS: A mass along the right margin of the pancreatic head is observed with following characteristics:  Size: 3.3 x 2.5 by 3.1 cm  Location: Pancreatic head along the right side, near the ampulla.  Characterization: Primarily solid, but with 2 cystic elements, 1 curvilinear superiorly and the other oval-shaped along the inferior portion of the mass.  Enhancement: Hypoenhancing relative to pancreatic parenchyma, with mild internal heterogeneity and some internal bandlike elements which have accentuated enhancement.  Other Characteristics:  The mass is anterior to be common bile duct and dorsal pancreatic duct, both of which skirt the margins of the mass. The mass may partially suspend with around of the CBD in the vicinity of the pancreas, and is not readily separable from the ampulla.  Local extent of mass: Mass not readily separable from the posterior wall of the gastric antrum - intact fat plane is incomplete.  Vascular Involvement: Mass abuts and may partially wrap around be gastroduodenal artery as shown on images 47 through 55 of series 12, where they GDA appears to be located between the mass and the adjacent anterior margin of the pancreas. No involvement of the hepatic artery, celiac trunk, off or SMA. The mass does not definitively involve of the portal vein.  Variant hepatic artery anatomy: Conventional hepatic arterial branching anatomy, with the right  and left hepatic arteries arising from knee proper hepatic artery.  Bile Duct Involvement: Not directly demonstrated, and there is no biliary dilatation, although of the mass is directly along be anterior margin of the common bile duct.  Variant biliary anatomy: No  Adjacent Nodes: No  Omental/Peritoneal Disease: Absent  Distant Metastases: Not observed.  The lung bases appear clear. Liver, spleen, and adrenal glands appear normal. Benign cyst, Bosniak category 1 cyst, left mid kidney, 1.0 cm in diameter on image 43 of series 16. Benign-appearing 5 mm cyst in the right kidney lower pole.  IMPRESSION: 1. Mostly solid enhancing mass along the right side of the pancreatic head has two internal cystic elements. Although there are some linear internal elements of the enhancement, I cannot definitively confirm the presence of a central scar, and there was no central calcification on of the prior CT scan to further support that this is a serous cystic neoplasm. The mass abuts of the GDA (which is sandwiched between of the mass and be adjacent normal pancreatic parenchyma) and abuts but does not  obstruct be common bile duct. The lack of hypervascularity makes neuroendocrine tumor with cystic degeneration less likely. The imaging characteristics are most worrisome for pancreatic adenocarcinoma. Gastrointestinal stromal tumor could conceivably cause a similar appearance. Percutaneous biopsy or resection likely indicated.   Electronically Signed   By: Sherryl Barters M.D.   On: 10/30/2013 13:30      ASSESSMENT AND PLAN: Malignant neoplasm of head of pancreas Patient appears to have a highly suspicious lesion in the head of his pancreas. This is worrisome for pancreatic cancer due to the infiltrative nature and indistinct nature of the mass.  He has a mass on MRI and on EUS. His mother had some form of intra-abdominal cancer and passed away from this 9 years ago.  We'll plan to do diagnostic laparoscopy followed by a Whipple. I would like to rule out metastatic disease to the chest with a chest CT. He has had a CA 19-9 which was normal in December.  The patient was advised to stop smoking and drinking completely. I reviewed the increased risk of postoperative complications if he continues this. We discussed that he may not have cancer and may have significant complications from surgery. However, the risk of this being a pancreatic cancer and going untreated is higher than the risk of surgery.  I discussed the surgery with the patient including diagrams of anatomy.  I discussed the potential for diagnostic laparoscopy.  In the case of pancreatic cancer, if spread of the disease is found, we will abort the procedure and not proceed with resection.  The rationale for this was discussed with the patient.  There has not been data to support resection of Stage IV disease in terms of survival benefit.    We discussed possible complications including: Potential of aborting procedure if tumor is invading the superior mesenteric or hepatic arteries Bleeding Infection and possible wound  complications such as hernia Damage to adjacent structures Leak of anastamoses, primarily pancreatic Possible need for other procedures Possible prolonged hospital stay Possible development of diabetes or worsening of current diabetes.  Possible pancreatic exocrine insufficiency Prolonged fatigue/weakness/appetite Difficulty with eating or post operative nausea Possible early recurrence of cancer   The patient understands and wishes to proceed.  The patient has been advised to turn in disability paperwork to our office.          Milus Height MD Surgical Oncology, General and [facility]      Visit Diagnoses: 1. Malignant neoplasm of head of pancreas     Primary Care Physician: No PCP Per Patient

## 2013-12-01 NOTE — Transfer of Care (Signed)
Immediate Anesthesia Transfer of Care Note  Patient: Brent Rogers  Procedure(s) Performed: Procedure(s) (LRB): LAPAROSCOPY DIAGNOSTIC PANCREATICODUODENECTOMY WITH BILIARY AND PANCREATIC STENTS (N/A)  Patient Location: PACU  Anesthesia Type: General  Level of Consciousness: sedated, patient cooperative and responds to stimulation  Airway & Oxygen Therapy: Patient Spontanous Breathing and Patient connected to face mask oxgen  Post-op Assessment: Report given to PACU RN and Post -op Vital signs reviewed and stable  Post vital signs: Reviewed and stable  Complications: No apparent anesthesia complications

## 2013-12-01 NOTE — Anesthesia Preprocedure Evaluation (Signed)
Anesthesia Evaluation  Patient identified by MRN, date of birth, ID band Patient awake    Reviewed: Allergy & Precautions, H&P , NPO status , Patient's Chart, lab work & pertinent test results  Airway Mallampati: II  TM Distance: >3 FB Neck ROM: Full    Dental no notable dental hx.    Pulmonary Current Smoker,  breath sounds clear to auscultation  Pulmonary exam normal       Cardiovascular negative cardio ROS  Rhythm:Regular Rate:Normal     Neuro/Psych negative neurological ROS  negative psych ROS   GI/Hepatic negative GI ROS, Neg liver ROS,   Endo/Other  negative endocrine ROS  Renal/GU negative Renal ROS  negative genitourinary   Musculoskeletal negative musculoskeletal ROS (+)   Abdominal   Peds negative pediatric ROS (+)  Hematology negative hematology ROS (+)   Anesthesia Other Findings   Reproductive/Obstetrics negative OB ROS                            Anesthesia Physical Anesthesia Plan  ASA: II  Anesthesia Plan: General   Post-op Pain Management:    Induction: Intravenous  Airway Management Planned: Oral ETT  Additional Equipment: Arterial line  Intra-op Plan:   Post-operative Plan: Extubation in OR  Informed Consent: I have reviewed the patients History and Physical, chart, labs and discussed the procedure including the risks, benefits and alternatives for the proposed anesthesia with the patient or authorized representative who has indicated his/her understanding and acceptance.   Dental advisory given  Plan Discussed with: CRNA and Surgeon  Anesthesia Plan Comments:         Anesthesia Quick Evaluation  

## 2013-12-02 ENCOUNTER — Encounter (HOSPITAL_COMMUNITY): Payer: Self-pay | Admitting: General Surgery

## 2013-12-02 LAB — COMPREHENSIVE METABOLIC PANEL
ALT: 30 U/L (ref 0–53)
AST: 46 U/L — ABNORMAL HIGH (ref 0–37)
Albumin: 2.9 g/dL — ABNORMAL LOW (ref 3.5–5.2)
Alkaline Phosphatase: 82 U/L (ref 39–117)
BUN: 6 mg/dL (ref 6–23)
CO2: 30 mEq/L (ref 19–32)
Calcium: 8.4 mg/dL (ref 8.4–10.5)
Chloride: 97 mEq/L (ref 96–112)
Creatinine, Ser: 0.79 mg/dL (ref 0.50–1.35)
GFR calc Af Amer: >90 mL/min (ref >90)
GFR calc non Af Amer: >90 mL/min (ref >90)
Glucose, Bld: 144 mg/dL — ABNORMAL HIGH (ref 70–99)
Potassium: 4.2 mEq/L (ref 3.7–5.3)
Sodium: 135 mEq/L — ABNORMAL LOW (ref 137–147)
Total Bilirubin: 0.6 mg/dL (ref 0.3–1.2)
Total Protein: 5.7 g/dL — ABNORMAL LOW (ref 6.0–8.3)

## 2013-12-02 LAB — PHOSPHORUS: Phosphorus: 3 mg/dL (ref 2.3–4.6)

## 2013-12-02 LAB — CBC
HCT: 44.5 % (ref 39.0–52.0)
Hemoglobin: 14.7 g/dL (ref 13.0–17.0)
MCH: 28.9 pg (ref 26.0–34.0)
MCHC: 33 g/dL (ref 30.0–36.0)
MCV: 87.6 fL (ref 78.0–100.0)
Platelets: 205 10*3/uL (ref 150–400)
RBC: 5.08 MIL/uL (ref 4.22–5.81)
RDW: 14.5 % (ref 11.5–15.5)
WBC: 14.9 10*3/uL — ABNORMAL HIGH (ref 4.0–10.5)

## 2013-12-02 LAB — PROTIME-INR
INR: 1.09 (ref 0.00–1.49)
Prothrombin Time: 13.9 seconds (ref 11.6–15.2)

## 2013-12-02 LAB — APTT: aPTT: 25 seconds (ref 24–37)

## 2013-12-02 LAB — MAGNESIUM: Magnesium: 1.5 mg/dL (ref 1.5–2.5)

## 2013-12-02 NOTE — Anesthesia Postprocedure Evaluation (Signed)
  Anesthesia Post-op Note  Patient: Brent Rogers  Procedure(s) Performed: Procedure(s) (LRB): LAPAROSCOPY DIAGNOSTIC PANCREATICODUODENECTOMY WITH BILIARY AND PANCREATIC STENTS (N/A)  Patient Location: PACU  Anesthesia Type: General  Level of Consciousness: awake and alert   Airway and Oxygen Therapy: Patient Spontanous Breathing  Post-op Pain: mild  Post-op Assessment: Post-op Vital signs reviewed, Patient's Cardiovascular Status Stable, Respiratory Function Stable, Patent Airway and No signs of Nausea or vomiting  Last Vitals:  Filed Vitals:   12/02/13 0700  BP: 132/100  Pulse: 91  Temp:   Resp: 24    Post-op Vital Signs: stable   Complications: No apparent anesthesia complications

## 2013-12-02 NOTE — Progress Notes (Signed)
1 Day Post-Op  Subjective: Complains of pain, but is helped by PCA.  No n/v.  No overnight issues.    Objective: Vital signs in last 24 hours: Temp:  [97 F (36.1 C)-98.6 F (37 C)] 97.7 F (36.5 C) (03/06 0800) Pulse Rate:  [45-99] 91 (03/06 0700) Resp:  [10-24] 12 (03/06 0740) BP: (126-157)/(87-112) 132/100 mmHg (03/06 0700) SpO2:  [81 %-100 %] 100 % (03/06 0740) FiO2 (%):  [50 %-100 %] 100 % (03/06 0740) Weight:  [160 lb 15 oz (73 kg)-164 lb 0.4 oz (74.4 kg)] 164 lb 0.4 oz (74.4 kg) (03/06 0400)    Intake/Output from previous day: 03/05 0701 - 03/06 0700 In: 6204 [I.V.:5764; NG/GT:60; IV Piggyback:300] Out: 2370 [Urine:1990; Drains:180; Blood:200] Intake/Output this shift:    General appearance: alert, cooperative and no distress Resp: breathing comforgtably GI: soft, non tender, non distended.  drains serosang.   Extremities: extremities normal, atraumatic, no cyanosis or edema  Lab Results:   Recent Labs  12/01/13 1517 12/02/13 0525  WBC 15.5* 14.9*  HGB 14.0 14.7  HCT 41.8 44.5  PLT 206 205   BMET  Recent Labs  12/01/13 1457 12/02/13 0651  NA  --  135*  K  --  4.2  CL  --  97  CO2  --  30  GLUCOSE  --  144*  BUN  --  6  CREATININE 0.74 0.79  CALCIUM  --  8.4   PT/INR  Recent Labs  12/02/13 0525  LABPROT 13.9  INR 1.09   ABG No results found for this basename: PHART, PCO2, PO2, HCO3,  in the last 72 hours  Studies/Results: Dg Abd Portable 1v  12/01/2013   CLINICAL DATA:  Abnormal needle count  EXAM: PORTABLE ABDOMEN - 1 VIEW  COMPARISON:  None.  FINDINGS: Jodell Cipro are seen across the midline. NG tube in place. Surgical staples in the left upper quadrant. Drain segment in the right upper quadrant. Surgical drain in the right upper quadrant. Angiocath projects over the right acetabulum. Phleboliths project over the pelvis. No curvilinear metallic objects project over the abdomen to suggest a retained needle. No surgical instruments. No obvious lap  sponge densities. No disproportionate dilatation of bowel.  IMPRESSION: No evidence of retained needle in the abdomen.   Electronically Signed   By: Maryclare Bean M.D.   On: 12/01/2013 12:05    Anti-infectives: Anti-infectives   Start     Dose/Rate Route Frequency Ordered Stop   12/02/13 0800  ertapenem (INVANZ) 1 g in sodium chloride 0.9 % 50 mL IVPB     1 g 100 mL/hr over 30 Minutes Intravenous Every 24 hours 12/01/13 1406 12/02/13 0906   12/01/13 0506  ertapenem (INVANZ) 1 g in sodium chloride 0.9 % 50 mL IVPB     1 g 100 mL/hr over 30 Minutes Intravenous On call to O.R. 12/01/13 0506 12/01/13 0750      Assessment/Plan: s/p Procedure(s): LAPAROSCOPY DIAGNOSTIC PANCREATICODUODENECTOMY WITH BILIARY AND PANCREATIC STENTS (N/A) PAS Continue foley due to strict I&O and urinary output monitoring transfer to floor NPO NGT for another 24-48 hours. Ambulate.    LOS: 1 day    [facility] 12/02/2013

## 2013-12-03 LAB — CBC
HCT: 36.8 % — ABNORMAL LOW (ref 39.0–52.0)
Hemoglobin: 11.9 g/dL — ABNORMAL LOW (ref 13.0–17.0)
MCH: 28.9 pg (ref 26.0–34.0)
MCHC: 32.3 g/dL (ref 30.0–36.0)
MCV: 89.3 fL (ref 78.0–100.0)
Platelets: 139 10*3/uL — ABNORMAL LOW (ref 150–400)
RBC: 4.12 MIL/uL — ABNORMAL LOW (ref 4.22–5.81)
RDW: 14.2 % (ref 11.5–15.5)
WBC: 14.8 10*3/uL — ABNORMAL HIGH (ref 4.0–10.5)

## 2013-12-03 LAB — COMPREHENSIVE METABOLIC PANEL
ALT: 16 U/L (ref 0–53)
AST: 27 U/L (ref 0–37)
Albumin: 2.4 g/dL — ABNORMAL LOW (ref 3.5–5.2)
Alkaline Phosphatase: 70 U/L (ref 39–117)
BUN: 4 mg/dL — ABNORMAL LOW (ref 6–23)
CO2: 27 mEq/L (ref 19–32)
Calcium: 8.4 mg/dL (ref 8.4–10.5)
Chloride: 96 mEq/L (ref 96–112)
Creatinine, Ser: 0.76 mg/dL (ref 0.50–1.35)
GFR calc Af Amer: >90 mL/min (ref >90)
GFR calc non Af Amer: >90 mL/min (ref >90)
Glucose, Bld: 124 mg/dL — ABNORMAL HIGH (ref 70–99)
Potassium: 3.6 mEq/L — ABNORMAL LOW (ref 3.7–5.3)
Sodium: 132 mEq/L — ABNORMAL LOW (ref 137–147)
Total Bilirubin: 0.4 mg/dL (ref 0.3–1.2)
Total Protein: 5.4 g/dL — ABNORMAL LOW (ref 6.0–8.3)

## 2013-12-03 NOTE — Progress Notes (Signed)
2 Days Post-Op  Subjective: Had some respiratory difficulty upstairs so transferred back to SDU last night.  Doing much better now.  Ambulating.  No dyspnea.  Objective: Vital signs in last 24 hours: Temp:  [98.2 F (36.8 C)-98.8 F (37.1 C)] 98.8 F (37.1 C) (03/07 0800) Pulse Rate:  [64-102] 102 (03/07 1000) Resp:  [16-25] 18 (03/07 1000) BP: (126-165)/(87-104) 145/95 mmHg (03/07 1000) SpO2:  [89 %-100 %] 100 % (03/07 1000) FiO2 (%):  [39 %-100 %] 39 % (03/06 2019)    Intake/Output from previous day: 03/06 0701 - 03/07 0700 In: 2539 [I.V.:2539] Out: 1465 [Urine:1385; Drains:80] Intake/Output this shift: Total I/O In: 350.5 [I.V.:320.5; NG/GT:30] Out: 225 [Urine:225]  PE: General- In NAD Lungs-rhonchi in left base Abdomen-soft, quiet, incision clean and intact, lateral drain with small amount of bilious fluid, serous fluid in other drain  Lab Results:   Recent Labs  12/02/13 0525 12/03/13 0320  WBC 14.9* 14.8*  HGB 14.7 11.9*  HCT 44.5 36.8*  PLT 205 139*   BMET  Recent Labs  12/02/13 0651 12/03/13 0320  NA 135* 132*  K 4.2 3.6*  CL 97 96  CO2 30 27  GLUCOSE 144* 124*  BUN 6 4*  CREATININE 0.79 0.76  CALCIUM 8.4 8.4   PT/INR  Recent Labs  12/02/13 0525  LABPROT 13.9  INR 1.09   Comprehensive Metabolic Panel:    Component Value Date/Time   NA 132* 12/03/2013 0320   NA 135* 12/02/2013 0651   K 3.6* 12/03/2013 0320   K 4.2 12/02/2013 0651   CL 96 12/03/2013 0320   CL 97 12/02/2013 0651   CO2 27 12/03/2013 0320   CO2 30 12/02/2013 0651   BUN 4* 12/03/2013 0320   BUN 6 12/02/2013 0651   CREATININE 0.76 12/03/2013 0320   CREATININE 0.79 12/02/2013 0651   GLUCOSE 124* 12/03/2013 0320   GLUCOSE 144* 12/02/2013 0651   CALCIUM 8.4 12/03/2013 0320   CALCIUM 8.4 12/02/2013 0651   AST 27 12/03/2013 0320   AST 46* 12/02/2013 0651   ALT 16 12/03/2013 0320   ALT 30 12/02/2013 0651   ALKPHOS 70 12/03/2013 0320   ALKPHOS 82 12/02/2013 0651   BILITOT 0.4 12/03/2013 0320   BILITOT 0.6  12/02/2013 0651   PROT 5.4* 12/03/2013 0320   PROT 5.7* 12/02/2013 0651   ALBUMIN 2.4* 12/03/2013 0320   ALBUMIN 2.9* 12/02/2013 6644     Studies/Results: No results found.  Anti-infectives: Anti-infectives   Start     Dose/Rate Route Frequency Ordered Stop   12/02/13 0800  ertapenem (INVANZ) 1 g in sodium chloride 0.9 % 50 mL IVPB     1 g 100 mL/hr over 30 Minutes Intravenous Every 24 hours 12/01/13 1406 12/02/13 0906   12/01/13 0506  ertapenem (INVANZ) 1 g in sodium chloride 0.9 % 50 mL IVPB     1 g 100 mL/hr over 30 Minutes Intravenous On call to O.R. 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    LOS: 2 days   Plan: Keep in SDU.  Wait for bowel function to return. Leave ng for now.   Brent Rogers J 12/03/2013

## 2013-12-04 DIAGNOSIS — E876 Hypokalemia: Secondary | ICD-10-CM

## 2013-12-04 LAB — CBC
HCT: 33.9 % — ABNORMAL LOW (ref 39.0–52.0)
Hemoglobin: 11.6 g/dL — ABNORMAL LOW (ref 13.0–17.0)
MCH: 29.5 pg (ref 26.0–34.0)
MCHC: 34.2 g/dL (ref 30.0–36.0)
MCV: 86.3 fL (ref 78.0–100.0)
Platelets: 138 10*3/uL — ABNORMAL LOW (ref 150–400)
RBC: 3.93 MIL/uL — ABNORMAL LOW (ref 4.22–5.81)
RDW: 13.7 % (ref 11.5–15.5)
WBC: 12.8 10*3/uL — ABNORMAL HIGH (ref 4.0–10.5)

## 2013-12-04 LAB — COMPREHENSIVE METABOLIC PANEL
ALT: 14 U/L (ref 0–53)
AST: 25 U/L (ref 0–37)
Albumin: 2.4 g/dL — ABNORMAL LOW (ref 3.5–5.2)
Alkaline Phosphatase: 70 U/L (ref 39–117)
BUN: 4 mg/dL — ABNORMAL LOW (ref 6–23)
CO2: 25 mEq/L (ref 19–32)
Calcium: 8.5 mg/dL (ref 8.4–10.5)
Chloride: 97 mEq/L (ref 96–112)
Creatinine, Ser: 0.82 mg/dL (ref 0.50–1.35)
GFR calc Af Amer: >90 mL/min (ref >90)
GFR calc non Af Amer: >90 mL/min (ref >90)
Glucose, Bld: 123 mg/dL — ABNORMAL HIGH (ref 70–99)
Potassium: 3.3 mEq/L — ABNORMAL LOW (ref 3.7–5.3)
Sodium: 133 mEq/L — ABNORMAL LOW (ref 137–147)
Total Bilirubin: 0.8 mg/dL (ref 0.3–1.2)
Total Protein: 5.6 g/dL — ABNORMAL LOW (ref 6.0–8.3)

## 2013-12-04 MED ORDER — POTASSIUM CHLORIDE 10 MEQ/100ML IV SOLN
10.0000 meq | INTRAVENOUS | Status: AC
  Administered 2013-12-04 (×4): 10 meq via INTRAVENOUS
  Filled 2013-12-04 (×2): qty 100

## 2013-12-04 NOTE — Progress Notes (Signed)
3 Days Post-Op  Subjective: Good pain control.  Did not sleep well last night.  No BM or flatus.  Objective: Vital signs in last 24 hours: Temp:  [98.6 F (37 C)-100 F (37.8 C)] 100 F (37.8 C) (03/08 0800) Pulse Rate:  [93-120] 104 (03/08 1100) Resp:  [17-39] 25 (03/08 1100) BP: (106-172)/(85-112) 131/97 mmHg (03/08 1100) SpO2:  [93 %-100 %] 100 % (03/08 1100) Weight:  [163 lb 5.8 oz (74.1 kg)] 163 lb 5.8 oz (74.1 kg) (03/08 0000)    Intake/Output from previous day: 03/07 0701 - 03/08 0700 In: 2431.5 [I.V.:2311.5; NG/GT:120] Out: 1845 [Urine:1750; Emesis/NG output:50; Drains:45] Intake/Output this shift: Total I/O In: 544.3 [I.V.:514.3; NG/GT:30] Out: 325 [Urine:325]  PE: General- In NAD Abdomen-soft, quiet, incision clean and intact, lateral drain with no significant drainage, serous fluid in other drain  Lab Results:   Recent Labs  12/03/13 0320 12/04/13 0324  WBC 14.8* 12.8*  HGB 11.9* 11.6*  HCT 36.8* 33.9*  PLT 139* 138*   BMET  Recent Labs  12/03/13 0320 12/04/13 0324  NA 132* 133*  K 3.6* 3.3*  CL 96 97  CO2 27 25  GLUCOSE 124* 123*  BUN 4* 4*  CREATININE 0.76 0.82  CALCIUM 8.4 8.5   PT/INR  Recent Labs  12/02/13 0525  LABPROT 13.9  INR 1.09   Comprehensive Metabolic Panel:    Component Value Date/Time   NA 133* 12/04/2013 0324   NA 132* 12/03/2013 0320   K 3.3* 12/04/2013 0324   K 3.6* 12/03/2013 0320   CL 97 12/04/2013 0324   CL 96 12/03/2013 0320   CO2 25 12/04/2013 0324   CO2 27 12/03/2013 0320   BUN 4* 12/04/2013 0324   BUN 4* 12/03/2013 0320   CREATININE 0.82 12/04/2013 0324   CREATININE 0.76 12/03/2013 0320   GLUCOSE 123* 12/04/2013 0324   GLUCOSE 124* 12/03/2013 0320   CALCIUM 8.5 12/04/2013 0324   CALCIUM 8.4 12/03/2013 0320   AST 25 12/04/2013 0324   AST 27 12/03/2013 0320   ALT 14 12/04/2013 0324   ALT 16 12/03/2013 0320   ALKPHOS 70 12/04/2013 0324   ALKPHOS 70 12/03/2013 0320   BILITOT 0.8 12/04/2013 0324   BILITOT 0.4 12/03/2013 0320   PROT 5.6*  12/04/2013 0324   PROT 5.4* 12/03/2013 0320   ALBUMIN 2.4* 12/04/2013 0324   ALBUMIN 2.4* 12/03/2013 0320     Studies/Results: No results found.  Anti-infectives: Anti-infectives   Start     Dose/Rate Route Frequency Ordered Stop   12/02/13 0800  ertapenem (INVANZ) 1 g in sodium chloride 0.9 % 50 mL IVPB     1 g 100 mL/hr over 30 Minutes Intravenous Every 24 hours 12/01/13 1406 12/02/13 0906   12/01/13 0506  ertapenem (INVANZ) 1 g in sodium chloride 0.9 % 50 mL IVPB     1 g 100 mL/hr over 30 Minutes Intravenous On call to O.R. 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  Drain output no longer bilious.  Minimal ng output.  Hypokalemia  LOS: 3 days   Plan: Remove ng tube.  Replace potassium. Hold on diet.   Brent Rogers 12/04/2013

## 2013-12-05 LAB — COMPREHENSIVE METABOLIC PANEL
ALT: 19 U/L (ref 0–53)
AST: 36 U/L (ref 0–37)
Albumin: 2.3 g/dL — ABNORMAL LOW (ref 3.5–5.2)
Alkaline Phosphatase: 76 U/L (ref 39–117)
BUN: 5 mg/dL — ABNORMAL LOW (ref 6–23)
CO2: 23 mEq/L (ref 19–32)
Calcium: 8.5 mg/dL (ref 8.4–10.5)
Chloride: 98 mEq/L (ref 96–112)
Creatinine, Ser: 0.79 mg/dL (ref 0.50–1.35)
GFR calc Af Amer: >90 mL/min (ref >90)
GFR calc non Af Amer: >90 mL/min (ref >90)
Glucose, Bld: 125 mg/dL — ABNORMAL HIGH (ref 70–99)
Potassium: 3.5 mEq/L — ABNORMAL LOW (ref 3.7–5.3)
Sodium: 133 mEq/L — ABNORMAL LOW (ref 137–147)
Total Bilirubin: 1 mg/dL (ref 0.3–1.2)
Total Protein: 5.7 g/dL — ABNORMAL LOW (ref 6.0–8.3)

## 2013-12-05 LAB — TYPE AND SCREEN
ABO/RH(D): O POS
Antibody Screen: NEGATIVE
Unit division: 0
Unit division: 0
Unit division: 0
Unit division: 0

## 2013-12-05 LAB — CBC
HCT: 31.7 % — ABNORMAL LOW (ref 39.0–52.0)
Hemoglobin: 10.6 g/dL — ABNORMAL LOW (ref 13.0–17.0)
MCH: 28.8 pg (ref 26.0–34.0)
MCHC: 33.4 g/dL (ref 30.0–36.0)
MCV: 86.1 fL (ref 78.0–100.0)
Platelets: 148 10*3/uL — ABNORMAL LOW (ref 150–400)
RBC: 3.68 MIL/uL — ABNORMAL LOW (ref 4.22–5.81)
RDW: 13.6 % (ref 11.5–15.5)
WBC: 11.1 10*3/uL — ABNORMAL HIGH (ref 4.0–10.5)

## 2013-12-05 MED ORDER — GUAIFENESIN ER 600 MG PO TB12
600.0000 mg | ORAL_TABLET | Freq: Two times a day (BID) | ORAL | Status: DC
  Administered 2013-12-05 – 2013-12-09 (×9): 600 mg via ORAL
  Filled 2013-12-05 (×10): qty 1

## 2013-12-05 MED ORDER — SODIUM CHLORIDE 0.9 % IN NEBU
3.0000 mL | INHALATION_SOLUTION | Freq: Three times a day (TID) | RESPIRATORY_TRACT | Status: DC
  Administered 2013-12-05 (×3): 3 mL via RESPIRATORY_TRACT
  Filled 2013-12-05 (×7): qty 3

## 2013-12-05 NOTE — Progress Notes (Signed)
Patient ID: Brent Rogers, male   DOB: October 01, 1965, 48 y.o.   MRN: [id number] 4 Days Post-Op   Subjective: Pt complains of significant secretions and cough.  Having issues sleeping.  Has been out of bed walking.    Objective: Vital signs in last 24 hours: Temp:  [98.3 F (36.8 C)-99.3 F (37.4 C)] 98.3 F (36.8 C) (03/09 0400) Pulse Rate:  [96-114] 96 (03/09 0621) Resp:  [20-35] 30 (03/09 0621) BP: (106-157)/(88-109) 143/92 mmHg (03/09 0621) SpO2:  [92 %-100 %] 97 % (03/09 0621)    Intake/Output from previous day: 03/08 0701 - 03/09 0700 In: 2759.3 [I.V.:2429.3; NG/GT:30; IV Piggyback:300] Out: 2141 [Urine:2075; Emesis/NG output:50; Drains:16] Intake/Output this shift:    PE: General- In NAD Abdomen-soft,  incision clean and intact, lateral drain with no significant drainage, serous fluid in other drain  Lab Results:   Recent Labs  12/04/13 0324 12/05/13 0325  WBC 12.8* 11.1*  HGB 11.6* 10.6*  HCT 33.9* 31.7*  PLT 138* 148*   BMET  Recent Labs  12/04/13 0324 12/05/13 0325  NA 133* 133*  K 3.3* 3.5*  CL 97 98  CO2 25 23  GLUCOSE 123* 125*  BUN 4* 5*  CREATININE 0.82 0.79  CALCIUM 8.5 8.5   PT/INR No results found for this basename: LABPROT, INR,  in the last 72 hours Comprehensive Metabolic Panel:    Component Value Date/Time   NA 133* 12/05/2013 0325   NA 133* 12/04/2013 0324   K 3.5* 12/05/2013 0325   K 3.3* 12/04/2013 0324   CL 98 12/05/2013 0325   CL 97 12/04/2013 0324   CO2 23 12/05/2013 0325   CO2 25 12/04/2013 0324   BUN 5* 12/05/2013 0325   BUN 4* 12/04/2013 0324   CREATININE 0.79 12/05/2013 0325   CREATININE 0.82 12/04/2013 0324   GLUCOSE 125* 12/05/2013 0325   GLUCOSE 123* 12/04/2013 0324   CALCIUM 8.5 12/05/2013 0325   CALCIUM 8.5 12/04/2013 0324   AST 36 12/05/2013 0325   AST 25 12/04/2013 0324   ALT 19 12/05/2013 0325   ALT 14 12/04/2013 0324   ALKPHOS 76 12/05/2013 0325   ALKPHOS 70 12/04/2013 0324   BILITOT 1.0 12/05/2013 0325   BILITOT 0.8 12/04/2013 0324   PROT 5.7*  12/05/2013 0325   PROT 5.6* 12/04/2013 0324   ALBUMIN 2.3* 12/05/2013 0325   ALBUMIN 2.4* 12/04/2013 0324     Studies/Results: No results found.  Anti-infectives: Anti-infectives   Start     Dose/Rate Route Frequency Ordered Stop   12/02/13 0800  ertapenem (INVANZ) 1 g in sodium chloride 0.9 % 50 mL IVPB     1 g 100 mL/hr over 30 Minutes Intravenous Every 24 hours 12/01/13 1406 12/02/13 0906   12/01/13 0506  ertapenem (INVANZ) 1 g in sodium chloride 0.9 % 50 mL IVPB     1 g 100 mL/hr over 30 Minutes Intravenous On call to O.R. 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 LOS: 4 days   Clear liquids. Add mucinex for secretions. Transfer to floor.   Await pathology    [facility] 12/05/2013

## 2013-12-05 NOTE — Progress Notes (Signed)
INITIAL NUTRITION ASSESSMENT  DOCUMENTATION CODES Per approved criteria  -Not Applicable   INTERVENTION: - Diet advancement per MD - Assisted pt with ordering meal - Will continue to monitor   NUTRITION DIAGNOSIS: Inadequate oral intake related to clear liquid diet as evidenced by diet order.   Goal: Advance diet as tolerated to regular diet   Monitor:  Weights, labs, diet advancement  Reason for Assessment: Malnutrition screening tool   48 y.o. male  Admitting Dx: Surgery for malignant pancreatic head mass   ASSESSMENT: Pt with malignant neoplasm of head of pancreas. Had diagnostic laparoscopy with classic pancreaticoduodenectomy with placement of pancreatic and biliary stent 12/01/13. C/o significant secretions and cough today, MD started pt on Mucinex.   Met with pt who reports eating well with good appetite PTA, 2-3 meals/day. States he's lost 5 pounds unintentionally in the past month due to his illness. Denies any nausea or pain. Does not want any nutritional supplements. Falling asleep during conversation.   Potassium low, getting replacement in IVF and IV potassium chloride   Height: Ht Readings from Last 1 Encounters:  12/01/13 6' 3" (1.905 m)    Weight: Wt Readings from Last 1 Encounters:  12/04/13 163 lb 5.8 oz (74.1 kg)    Ideal Body Weight: 196 lb   % Ideal Body Weight: 83%  Wt Readings from Last 10 Encounters:  12/04/13 163 lb 5.8 oz (74.1 kg)  12/04/13 163 lb 5.8 oz (74.1 kg)  11/28/13 164 lb 6 oz (74.56 kg)  11/07/13 166 lb (75.297 kg)  11/02/13 159 lb 3.2 oz (72.213 kg)  09/20/13 158 lb 9.6 oz (71.94 kg)  09/20/13 158 lb 9.6 oz (71.94 kg)  09/20/13 158 lb 9.6 oz (71.94 kg)  09/18/13 161 lb 3.2 oz (73.12 kg)    Usual Body Weight: 168 lb per pt  % Usual Body Weight: 97%  BMI:  Body mass index is 20.42 kg/(m^2).  Estimated Nutritional Needs: Kcal: 2250-2450 Protein: 115-130g Fluid: 2.2-2.4L/day  Skin: Abdominal incision   Diet Order:  Clear Liquid  EDUCATION NEEDS: -No education needs identified at this time   Intake/Output Summary (Last 24 hours) at 12/05/13 1041 Last data filed at 12/05/13 0600  Gross per 24 hour  Intake   2315 ml  Output   1816 ml  Net    499 ml    Last BM: PTA  Labs:   Recent Labs Lab 12/02/13 0651 12/03/13 0320 12/04/13 0324 12/05/13 0325  NA 135* 132* 133* 133*  K 4.2 3.6* 3.3* 3.5*  CL 97 96 97 98  CO2 _0 BUN 6 4* 4* 5*  CREATININE 0.79 0.76 0.82 0.79  CALCIUM 8.4 8.4 8.5 8.5  MG 1.5  --   --   --   PHOS 3.0  --   --   --   GLUCOSE 144* 124* 123* 125*    CBG (last 3)  No results found for this basename: GLUCAP,  in the last 72 hours  Scheduled Meds: . enoxaparin (LOVENOX) injection  40 mg Subcutaneous Q24H  . guaiFENesin  600 mg Oral BID  . morphine   Intravenous 6 times per day  . pantoprazole (PROTONIX) IV  40 mg Intravenous Daily  . sodium chloride  3 mL Nebulization 3 times per day    Continuous Infusions: . dextrose 5 % and 0.45 % NaCl with KCl 20 mEq/L 75 mL/hr at 12/05/13 1021    Past Medical History  Diagnosis Date  . Scoliosis   .  Pancreatic abnormality     CT  shows mass    Past Surgical History  Procedure Laterality Date  . Lumbar disc surgery  1990's  . Finger fracture surgery Left 1995    5th digit  . Eus N/A 09/21/2013    Procedure: ESOPHAGEAL ENDOSCOPIC ULTRASOUND (EUS) RADIAL;  Surgeon: Patrick D Hung, MD;  Location: [facility];  Service: Endoscopy;  Laterality: N/A;  . Fine needle aspiration N/A 09/21/2013    Procedure: FINE NEEDLE ASPIRATION (FNA) LINEAR;  Surgeon: Patrick D Hung, MD;  Location: [facility];  Service: Endoscopy;  Laterality: N/A;  . Back surgery  yrs ago    lower  . Eus N/A 09/30/2013    Procedure: UPPER ENDOSCOPIC ULTRASOUND (EUS) LINEAR;  Surgeon: Patrick D Hung, MD;  Location: [facility];  Service: Endoscopy;  Laterality: N/A;  . Laparoscopy N/A 12/01/2013    Procedure: LAPAROSCOPY DIAGNOSTIC  PANCREATICODUODENECTOMY WITH BILIARY AND PANCREATIC STENTS;  Surgeon: Faera Byerly, MD;  Location: [facility];  Service: General;  Laterality: N/A;     Baron MS, RD, LDN [phone] Pager [phone] After Hours Pager  

## 2013-12-06 LAB — CBC
HCT: 35.2 % — ABNORMAL LOW (ref 39.0–52.0)
Hemoglobin: 12.1 g/dL — ABNORMAL LOW (ref 13.0–17.0)
MCH: 28.8 pg (ref 26.0–34.0)
MCHC: 34.4 g/dL (ref 30.0–36.0)
MCV: 83.8 fL (ref 78.0–100.0)
Platelets: 262 10*3/uL (ref 150–400)
RBC: 4.2 MIL/uL — ABNORMAL LOW (ref 4.22–5.81)
RDW: 13.5 % (ref 11.5–15.5)
WBC: 9.4 10*3/uL (ref 4.0–10.5)

## 2013-12-06 LAB — COMPREHENSIVE METABOLIC PANEL
ALT: 73 U/L — ABNORMAL HIGH (ref 0–53)
AST: 100 U/L — ABNORMAL HIGH (ref 0–37)
Albumin: 2.6 g/dL — ABNORMAL LOW (ref 3.5–5.2)
Alkaline Phosphatase: 112 U/L (ref 39–117)
BUN: 6 mg/dL (ref 6–23)
CO2: 22 mEq/L (ref 19–32)
Calcium: 9.2 mg/dL (ref 8.4–10.5)
Chloride: 100 mEq/L (ref 96–112)
Creatinine, Ser: 0.76 mg/dL (ref 0.50–1.35)
GFR calc Af Amer: >90 mL/min (ref >90)
GFR calc non Af Amer: >90 mL/min (ref >90)
Glucose, Bld: 127 mg/dL — ABNORMAL HIGH (ref 70–99)
Potassium: 4 mEq/L (ref 3.7–5.3)
Sodium: 136 mEq/L — ABNORMAL LOW (ref 137–147)
Total Bilirubin: 1.6 mg/dL — ABNORMAL HIGH (ref 0.3–1.2)
Total Protein: 6.6 g/dL (ref 6.0–8.3)

## 2013-12-06 MED ORDER — SODIUM CHLORIDE 0.9 % IN NEBU
3.0000 mL | INHALATION_SOLUTION | Freq: Three times a day (TID) | RESPIRATORY_TRACT | Status: DC
Start: 2013-12-06 — End: 2013-12-06
  Administered 2013-12-06 (×2): 3 mL via RESPIRATORY_TRACT
  Filled 2013-12-06 (×4): qty 3

## 2013-12-06 MED ORDER — PANTOPRAZOLE SODIUM 40 MG PO TBEC
40.0000 mg | DELAYED_RELEASE_TABLET | Freq: Every day | ORAL | Status: DC
  Administered 2013-12-07 – 2013-12-09 (×3): 40 mg via ORAL
  Filled 2013-12-06 (×4): qty 1

## 2013-12-06 MED ORDER — ALBUTEROL SULFATE (2.5 MG/3ML) 0.083% IN NEBU: 2.5000 mg | INHALATION_SOLUTION | Freq: Three times a day (TID) | RESPIRATORY_TRACT | Status: DC | PRN

## 2013-12-06 MED ORDER — URSODIOL 300 MG PO CAPS
300.0000 mg | ORAL_CAPSULE | Freq: Two times a day (BID) | ORAL | Status: DC
  Administered 2013-12-06 – 2013-12-09 (×7): 300 mg via ORAL
  Filled 2013-12-06 (×8): qty 1

## 2013-12-06 MED ORDER — SIMETHICONE 40 MG/0.6ML PO SUSP
40.0000 mg | Freq: Four times a day (QID) | ORAL | Status: DC | PRN
  Administered 2013-12-06: 40 mg via ORAL
  Filled 2013-12-06: qty 0.6

## 2013-12-06 NOTE — Progress Notes (Signed)
Patient ID: Brent Rogers, male   DOB: Aug 22, 1966, 48 y.o.   MRN: [id number] 5 Days Post-Op   Subjective: Pt started having flatus.  Pain better controlled.    Objective: Vital signs in last 24 hours: Temp:  [98.1 F (36.7 C)-99 F (37.2 C)] 98.1 F (36.7 C) (03/10 1300) Pulse Rate:  [81-90] 86 (03/10 1300) Resp:  [20-33] 20 (03/10 1308) BP: (136-158)/(85-97) 137/85 mmHg (03/10 1300) SpO2:  [94 %-100 %] 95 % (03/10 1327) Last BM Date: 12/01/13  Intake/Output from previous day: 03/09 0701 - 03/10 0700 In: 1875 [I.V.:1875] Out: 2035 [Urine:2025; Drains:10] Intake/Output this shift: Total I/O In: 525 [I.V.:525] Out: -   PE: General- In NAD Abdomen-soft,  incision clean and intact, one drain sl murky, scant drainage.  Other drain serosang. Lab Results:   Recent Labs  12/05/13 0325 12/06/13 0323  WBC 11.1* 9.4  HGB 10.6* 12.1*  HCT 31.7* 35.2*  PLT 148* 262   BMET  Recent Labs  12/05/13 0325 12/06/13 0323  NA 133* 136*  K 3.5* 4.0  CL 98 100  CO2 23 22  GLUCOSE 125* 127*  BUN 5* 6  CREATININE 0.79 0.76  CALCIUM 8.5 9.2   PT/INR No results found for this basename: LABPROT, INR,  in the last 72 hours Comprehensive Metabolic Panel:    Component Value Date/Time   NA 136* 12/06/2013 0323   NA 133* 12/05/2013 0325   K 4.0 12/06/2013 0323   K 3.5* 12/05/2013 0325   CL 100 12/06/2013 0323   CL 98 12/05/2013 0325   CO2 22 12/06/2013 0323   CO2 23 12/05/2013 0325   BUN 6 12/06/2013 0323   BUN 5* 12/05/2013 0325   CREATININE 0.76 12/06/2013 0323   CREATININE 0.79 12/05/2013 0325   GLUCOSE 127* 12/06/2013 0323   GLUCOSE 125* 12/05/2013 0325   CALCIUM 9.2 12/06/2013 0323   CALCIUM 8.5 12/05/2013 0325   AST 100* 12/06/2013 0323   AST 36 12/05/2013 0325   ALT 73* 12/06/2013 0323   ALT 19 12/05/2013 0325   ALKPHOS 112 12/06/2013 0323   ALKPHOS 76 12/05/2013 0325   BILITOT 1.6* 12/06/2013 0323   BILITOT 1.0 12/05/2013 0325   PROT 6.6 12/06/2013 0323   PROT 5.7* 12/05/2013 0325   ALBUMIN  2.6* 12/06/2013 0323   ALBUMIN 2.3* 12/05/2013 0325     Studies/Results: No results found.  Anti-infectives: Anti-infectives   Start     Dose/Rate Route Frequency Ordered Stop   12/02/13 0800  ertapenem (INVANZ) 1 g in sodium chloride 0.9 % 50 mL IVPB     1 g 100 mL/hr over 30 Minutes Intravenous Every 24 hours 12/01/13 1406 12/02/13 0906   12/01/13 0506  ertapenem (INVANZ) 1 g in sodium chloride 0.9 % 50 mL IVPB     1 g 100 mL/hr over 30 Minutes Intravenous On call to O.R. 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 LOS: 5 days   Full liquid diet.   Simethicone for gas.   Mucinex for secretions. Decrease IVF Await floor bed Transition to po protonix. Actigall for increased T bili.     Await pathology    [facility] 12/06/2013

## 2013-12-06 NOTE — Progress Notes (Signed)
CARE MANAGEMENT NOTE 12/06/2013  Patient:  Brent Rogers, Brent Rogers   Account Number:  [id number]  Date Initiated:  12/06/2013  Documentation initiated by:  Bailee Metter  Subjective/Objective Assessment:   pod 4/first day of seeing patient for this RN, being transferred to med surg acute care floor/pt had head of pancrease removed due to ca on [id number]     Action/Plan:   diet being advanced/pain control not met but being controlled/home when stable   Anticipated DC Date:  12/09/2013   Anticipated DC Plan:  HOME/SELF CARE  In-house referral  NA      DC Planning Services  NA      [facility] Choice  NA   Choice offered to / List presented to:  NA   DME arranged  NA      DME agency  NA     [place] arranged  NA      [place] agency  NA   Status of service:  In process, will continue to follow Medicare Important Message given?  NA - LOS <3 / Initial given by admissions (If response is "NO", the following Medicare IM given date fields will be blank) Date Medicare IM given:   Date Additional Medicare IM given:    Discharge Disposition:    Per UR Regulation:  Reviewed for med. necessity/level of care/duration of stay  If discussed at [facility] of Stay Meetings, dates discussed:   12/06/2013    Comments:  [id number]/Olden Klauer Rosana Hoes RN, BSN, [place] [phone] Chart Reviewed for discharge and hospital needs. Discharge needs at time of review:  None present will follow for needs. Review of patient progress due on [id number].

## 2013-12-07 LAB — COMPREHENSIVE METABOLIC PANEL
ALT: 65 U/L — ABNORMAL HIGH (ref 0–53)
AST: 62 U/L — ABNORMAL HIGH (ref 0–37)
Albumin: 2.3 g/dL — ABNORMAL LOW (ref 3.5–5.2)
Alkaline Phosphatase: 97 U/L (ref 39–117)
BUN: 6 mg/dL (ref 6–23)
CO2: 24 mEq/L (ref 19–32)
Calcium: 8.6 mg/dL (ref 8.4–10.5)
Chloride: 98 mEq/L (ref 96–112)
Creatinine, Ser: 0.71 mg/dL (ref 0.50–1.35)
GFR calc Af Amer: >90 mL/min (ref >90)
GFR calc non Af Amer: >90 mL/min (ref >90)
Glucose, Bld: 120 mg/dL — ABNORMAL HIGH (ref 70–99)
Potassium: 3.2 mEq/L — ABNORMAL LOW (ref 3.7–5.3)
Sodium: 133 mEq/L — ABNORMAL LOW (ref 137–147)
Total Bilirubin: 1.4 mg/dL — ABNORMAL HIGH (ref 0.3–1.2)
Total Protein: 5.7 g/dL — ABNORMAL LOW (ref 6.0–8.3)

## 2013-12-07 LAB — CBC
HCT: 30.1 % — ABNORMAL LOW (ref 39.0–52.0)
Hemoglobin: 10.2 g/dL — ABNORMAL LOW (ref 13.0–17.0)
MCH: 29 pg (ref 26.0–34.0)
MCHC: 33.9 g/dL (ref 30.0–36.0)
MCV: 85.5 fL (ref 78.0–100.0)
Platelets: 273 10*3/uL (ref 150–400)
RBC: 3.52 MIL/uL — ABNORMAL LOW (ref 4.22–5.81)
RDW: 14 % (ref 11.5–15.5)
WBC: 8.4 10*3/uL (ref 4.0–10.5)

## 2013-12-07 MED ORDER — OXYCODONE HCL 5 MG PO TABS
5.0000 mg | ORAL_TABLET | ORAL | Status: DC | PRN
  Administered 2013-12-07: 10 mg via ORAL
  Administered 2013-12-08 – 2013-12-09 (×2): 5 mg via ORAL
  Administered 2013-12-09: 10 mg via ORAL
  Filled 2013-12-07 (×2): qty 2
  Filled 2013-12-07 (×2): qty 1

## 2013-12-07 NOTE — Progress Notes (Signed)
Patient ID: Brent Rogers, male   DOB: [birth year], 48 y.o.   MRN: [id number] 6 Days Post-Op   Subjective: Continues to have flatus, no BM yet.  No n/v.  Ambulating.    Objective: Vital signs in last 24 hours: Temp:  [98.1 F (36.7 C)-99.2 F (37.3 C)] 98.9 F (37.2 C) (03/11 0537) Pulse Rate:  [71-90] 71 (03/11 0537) Resp:  [16-20] 16 (03/11 0537) BP: (117-158)/(76-97) 117/76 mmHg (03/11 0537) SpO2:  [94 %-100 %] 97 % (03/11 0537) FiO2 (%):  [39 %] 39 % (03/11 0400) Last BM Date: 12/01/13  Intake/Output from previous day: 03/10 0701 - 03/11 0700 In: 885 [P.O.:240; I.V.:645] Out: 917 [Urine:875; Drains:42] Intake/Output this shift:    PE: General- In NAD Abdomen-soft,  incision clean and intact, drains serosang. Lab Results:   Recent Labs  12/06/13 0323 12/07/13 0435  WBC 9.4 8.4  HGB 12.1* 10.2*  HCT 35.2* 30.1*  PLT 262 273   BMET  Recent Labs  12/06/13 0323 12/07/13 0435  NA 136* 133*  K 4.0 3.2*  CL 100 98  CO2 22 24  GLUCOSE 127* 120*  BUN 6 6  CREATININE 0.76 0.71  CALCIUM 9.2 8.6   PT/INR No results found for this basename: LABPROT, INR,  in the last 72 hours Comprehensive Metabolic Panel:    Component Value Date/Time   NA 133* 12/07/2013 0435   NA 136* 12/06/2013 0323   K 3.2* 12/07/2013 0435   K 4.0 12/06/2013 0323   CL 98 12/07/2013 0435   CL 100 12/06/2013 0323   CO2 24 12/07/2013 0435   CO2 22 12/06/2013 0323   BUN 6 12/07/2013 0435   BUN 6 12/06/2013 0323   CREATININE 0.71 12/07/2013 0435   CREATININE 0.76 12/06/2013 0323   GLUCOSE 120* 12/07/2013 0435   GLUCOSE 127* 12/06/2013 0323   CALCIUM 8.6 12/07/2013 0435   CALCIUM 9.2 12/06/2013 0323   AST 62* 12/07/2013 0435   AST 100* 12/06/2013 0323   ALT 65* 12/07/2013 0435   ALT 73* 12/06/2013 0323   ALKPHOS 97 12/07/2013 0435   ALKPHOS 112 12/06/2013 0323   BILITOT 1.4* 12/07/2013 0435   BILITOT 1.6* 12/06/2013 0323   PROT 5.7* 12/07/2013 0435   PROT 6.6 12/06/2013 0323   ALBUMIN 2.3* 12/07/2013  0435   ALBUMIN 2.6* 12/06/2013 0323     Studies/Results: No results found.  Anti-infectives: Anti-infectives   Start     Dose/Rate Route Frequency Ordered Stop   12/02/13 0800  ertapenem (INVANZ) 1 g in sodium chloride 0.9 % 50 mL IVPB     1 g 100 mL/hr over 30 Minutes Intravenous Every 24 hours 12/01/13 1406 12/02/13 0906   12/01/13 0506  ertapenem (INVANZ) 1 g in sodium chloride 0.9 % 50 mL IVPB     1 g 100 mL/hr over 30 Minutes Intravenous On call to O.R. 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 LOS: 6 days   Advance to reg diet Nutrition consult Hypokalemia - replete Simethicone for gas.   Mucinex for secretions. Po pain meds Care management/social work for discharge planning needs since lives with elderly father.   po protonix. Actigall for increased T bili, coming down.     Await pathology    [facility] 12/07/2013

## 2013-12-07 NOTE — Care Management Note (Signed)
    Page 1 of 2   12/07/2013     3:18:13 PM   CARE MANAGEMENT NOTE 12/07/2013  Patient:  Brent Rogers, Brent Rogers   Account Number:  [id number]  Date Initiated:  12/06/2013  Documentation initiated by:  Sunday Spillers  Subjective/Objective Assessment:   pod 4/first day of seeing patient for this RN, being transferred to med surg acute care floor/pt had head of pancrease removed due to ca on [id number]/     Action/Plan:   diet being advanced/pain control not met but being controlled/home when stable   Anticipated DC Date:  12/09/2013   Anticipated DC Plan:  HOME/SELF CARE  In-house referral  NA      DC Planning Services  CM consult      PAC Choice  NA   Choice offered to / List presented to:  NA   DME arranged  NA      DME agency  NA     [place] arranged  NA      [place] agency  NA   Status of service:  Completed, signed off Medicare Important Message given?  NA - LOS <3 / Initial given by admissions (If response is "NO", the following Medicare IM given date fields will be blank) Date Medicare IM given:   Date Additional Medicare IM given:    Discharge Disposition:  HOME/SELF CARE  Per UR Regulation:  Reviewed for med. necessity/level of care/duration of stay  If discussed at [place] of Stay Meetings, dates discussed:   12/06/2013    Comments:  12-07-13 [place] 1311 Spoke with patient at bedside. States he lives with his father and is caregiver for him. Currently the father is being managed by patient's sister. Father is mostly independent. Patient states he will need someone to assist him with meals and activity. Discussed with patient differences in skilled services and custodial. Patient thinks he would benefit from PT once d/ced to help get his strength back. Would like to use [facility], is able to afford medcations now, not sure what he will need at d/c. Will continue to follow for d/c needs.  [place], RN, BSN, CCM [phone] Chart Reviewed for  discharge and hospital needs. Discharge needs at time of review:  None present will follow for needs. Review of patient progress due on [id number].

## 2013-12-07 NOTE — Progress Notes (Signed)
NUTRITION FOLLOW UP/CONSULT FOR EDUCATION  Intervention:   - Pt not currently on pancreatic enzymes. Pt not reporting any signs/symptoms of fat malabsorption, denies any diarrhea. Recommend monitoring for fat malabsorption and need for pancreatic enzymes.  - Educated pt on low fat, low lactose, low fiber diet and encouraged small frequent meals r/t recent pancreaticoduodenectomy. Handouts provided of this information with RD contact information.  - Will continue to monitor   Nutrition Dx:   Inadequate oral intake related to clear liquid diet as evidenced by diet order - no longer appropriate, diet advanced  New nutrition dx: Increased nutrient needs related to malignant pancreatic head mass s/p pancreaticoduodenectomy as evidenced by MD notes.    Goal:   Diet advancement - met  New goal: Pt to consume >90% of meals   Monitor:   Weights, labs, intake  Assessment:   Pt with malignant neoplasm of head of pancreas. Had diagnostic laparoscopy with classic pancreaticoduodenectomy with placement of pancreatic and biliary stent 12/01/13. C/o significant secretions and cough today, MD started pt on Mucinex.   3/9 - Met with pt who reports eating well with good appetite PTA, 2-3 meals/day. States he's lost 5 pounds unintentionally in the past month due to his illness. Denies any nausea or pain. Does not want any nutritional supplements. Falling asleep during conversation.   3/11 - Diet advanced to diabetic diet today. Pt reports he has been eating about 50% of meals. Ate half of his eggs, grits and toast this morning. Denies any nausea or diarrhea.   Potassium low AST/ALT elevated but trending down Total bilirubin elevated    Height: Ht Readings from Last 1 Encounters:  12/01/13 _0  (1.905 m)    Weight Status:   Wt Readings from Last 1 Encounters:  12/04/13 163 lb 5.8 oz (74.1 kg)    Re-estimated needs:  Kcal: 2250-2450  Protein: 115-130g  Fluid: 2.2-2.4L/day   Skin:  Abdominal incision    Diet Order: Carb Control   Intake/Output Summary (Last 24 hours) at 12/07/13 1157 Last data filed at 12/07/13 1000  Gross per 24 hour  Intake    975 ml  Output   1174 ml  Net   -199 ml    Last BM: 3/5   Labs:   Recent Labs Lab 12/02/13 0651  12/05/13 0325 12/06/13 0323 12/07/13 0435  NA 135*  < > 133* 136* 133*  K 4.2  < > 3.5* 4.0 3.2*  CL 97  < > 98 100 98  CO2 30  < > _1 BUN 6  < > 5* 6 6  CREATININE 0.79  < > 0.79 0.76 0.71  CALCIUM 8.4  < > 8.5 9.2 8.6  MG 1.5  --   --   --   --   PHOS 3.0  --   --   --   --   GLUCOSE 144*  < > 125* 127* 120*  < > = values in this interval not displayed.  CBG (last 3)  No results found for this basename: GLUCAP,  in the last 72 hours  Scheduled Meds: . enoxaparin (LOVENOX) injection  40 mg Subcutaneous Q24H  . guaiFENesin  600 mg Oral BID  . pantoprazole  40 mg Oral Q1200  . ursodiol  300 mg Oral BID     Mikey College MS, RD, LDN [phone] Pager [phone] After Hours Pager

## 2013-12-08 LAB — COMPREHENSIVE METABOLIC PANEL
ALT: 52 U/L (ref 0–53)
AST: 35 U/L (ref 0–37)
Albumin: 2.4 g/dL — ABNORMAL LOW (ref 3.5–5.2)
Alkaline Phosphatase: 96 U/L (ref 39–117)
BUN: 8 mg/dL (ref 6–23)
CO2: 25 mEq/L (ref 19–32)
Calcium: 8.9 mg/dL (ref 8.4–10.5)
Chloride: 101 mEq/L (ref 96–112)
Creatinine, Ser: 0.81 mg/dL (ref 0.50–1.35)
GFR calc Af Amer: >90 mL/min (ref >90)
GFR calc non Af Amer: >90 mL/min (ref >90)
Glucose, Bld: 106 mg/dL — ABNORMAL HIGH (ref 70–99)
Potassium: 3.4 mEq/L — ABNORMAL LOW (ref 3.7–5.3)
Sodium: 138 mEq/L (ref 137–147)
Total Bilirubin: 0.9 mg/dL (ref 0.3–1.2)
Total Protein: 5.9 g/dL — ABNORMAL LOW (ref 6.0–8.3)

## 2013-12-08 LAB — CBC
HCT: 30.6 % — ABNORMAL LOW (ref 39.0–52.0)
Hemoglobin: 10.3 g/dL — ABNORMAL LOW (ref 13.0–17.0)
MCH: 28.8 pg (ref 26.0–34.0)
MCHC: 33.7 g/dL (ref 30.0–36.0)
MCV: 85.5 fL (ref 78.0–100.0)
Platelets: 354 10*3/uL (ref 150–400)
RBC: 3.58 MIL/uL — ABNORMAL LOW (ref 4.22–5.81)
RDW: 14.1 % (ref 11.5–15.5)
WBC: 8.8 10*3/uL (ref 4.0–10.5)

## 2013-12-08 MED ORDER — POTASSIUM CHLORIDE CRYS ER 20 MEQ PO TBCR
40.0000 meq | EXTENDED_RELEASE_TABLET | Freq: Every day | ORAL | Status: DC
  Administered 2013-12-08: 40 meq via ORAL
  Filled 2013-12-08 (×2): qty 2

## 2013-12-08 MED ORDER — BISACODYL 10 MG RE SUPP
10.0000 mg | Freq: Every day | RECTAL | Status: DC
  Administered 2013-12-08: 10 mg via RECTAL
  Filled 2013-12-08: qty 1

## 2013-12-08 NOTE — Progress Notes (Signed)
Patient ID: Brent Rogers, male   DOB: 06-12-1966, 48 y.o.   MRN: [id number] 7 Days Post-Op   Subjective: Eating a bit better.  Getting up and around.  No BM yet.  No n/v.    Objective: Vital signs in last 24 hours: Temp:  [98.6 F (37 C)-99.2 F (37.3 C)] 98.6 F (37 C) (03/12 0615) Pulse Rate:  [61-77] 61 (03/12 0615) Resp:  [16-20] 16 (03/12 0615) BP: (110-135)/(75-92) 110/75 mmHg (03/12 0615) SpO2:  [96 %-98 %] 98 % (03/12 0615) Last BM Date: 12/01/13  Intake/Output from previous day: 03/11 0701 - 03/12 0700 In: 480 [P.O.:480] Out: 521 [Urine:500; Drains:21] Intake/Output this shift: Total I/O In: -  Out: 15 [Drains:15]  PE: General- In NAD Abdomen-soft,  incision clean and intact, drains serosang. Breathing comfortably  Lab Results:   Recent Labs  12/07/13 0435 12/08/13 0435  WBC 8.4 8.8  HGB 10.2* 10.3*  HCT 30.1* 30.6*  PLT 273 354   BMET  Recent Labs  12/07/13 0435 12/08/13 0435  NA 133* 138  K 3.2* 3.4*  CL 98 101  CO2 24 25  GLUCOSE 120* 106*  BUN 6 8  CREATININE 0.71 0.81  CALCIUM 8.6 8.9   PT/INR No results found for this basename: LABPROT, INR,  in the last 72 hours Comprehensive Metabolic Panel:    Component Value Date/Time   NA 138 12/08/2013 0435   NA 133* 12/07/2013 0435   K 3.4* 12/08/2013 0435   K 3.2* 12/07/2013 0435   CL 101 12/08/2013 0435   CL 98 12/07/2013 0435   CO2 25 12/08/2013 0435   CO2 24 12/07/2013 0435   BUN 8 12/08/2013 0435   BUN 6 12/07/2013 0435   CREATININE 0.81 12/08/2013 0435   CREATININE 0.71 12/07/2013 0435   GLUCOSE 106* 12/08/2013 0435   GLUCOSE 120* 12/07/2013 0435   CALCIUM 8.9 12/08/2013 0435   CALCIUM 8.6 12/07/2013 0435   AST 35 12/08/2013 0435   AST 62* 12/07/2013 0435   ALT 52 12/08/2013 0435   ALT 65* 12/07/2013 0435   ALKPHOS 96 12/08/2013 0435   ALKPHOS 97 12/07/2013 0435   BILITOT 0.9 12/08/2013 0435   BILITOT 1.4* 12/07/2013 0435   PROT 5.9* 12/08/2013 0435   PROT 5.7* 12/07/2013 0435   ALBUMIN 2.4*  12/08/2013 0435   ALBUMIN 2.3* 12/07/2013 0435     Studies/Results: No results found.  Anti-infectives: Anti-infectives   Start     Dose/Rate Route Frequency Ordered Stop   12/02/13 0800  ertapenem (INVANZ) 1 g in sodium chloride 0.9 % 50 mL IVPB     1 g 100 mL/hr over 30 Minutes Intravenous Every 24 hours 12/01/13 1406 12/02/13 0906   12/01/13 0506  ertapenem (INVANZ) 1 g in sodium chloride 0.9 % 50 mL IVPB     1 g 100 mL/hr over 30 Minutes Intravenous On call to O.R. 12/01/13 0506 12/01/13 0750      Assessment Active Problems:   Malignant tumor head pancreas s/p pancreaticduodenectomy   LOS: 7 days   Reg diet Nutrition consult Hypokalemia - replete oral potassium Simethicone for gas.   Mucinex for secretions. Po pain meds Care management/social work for discharge planning needs since lives with elderly father. D/c lateral drain, staples.    po protonix. Actigall for increased T bili, coming down.     Await pathology [place] for d/c in next few days depending on diet.     [facility] 12/08/2013

## 2013-12-08 NOTE — Progress Notes (Signed)
PT Cancellation Note  Patient Details Name: Brent Rogers MRN: [id number] DOB: October 17, 1965   Cancelled Treatment:    Reason Eval/Treat Not Completed: PT screened, no needs identified, will sign off RN reports pt up ambulating, no PT needs.   Claretha Cooper 12/08/2013, 10:12 AM Tresa Endo PT [phone]

## 2013-12-09 LAB — COMPREHENSIVE METABOLIC PANEL
ALT: 44 U/L (ref 0–53)
AST: 29 U/L (ref 0–37)
Albumin: 2.4 g/dL — ABNORMAL LOW (ref 3.5–5.2)
Alkaline Phosphatase: 87 U/L (ref 39–117)
BUN: 10 mg/dL (ref 6–23)
CO2: 24 mEq/L (ref 19–32)
Calcium: 8.7 mg/dL (ref 8.4–10.5)
Chloride: 102 mEq/L (ref 96–112)
Creatinine, Ser: 0.8 mg/dL (ref 0.50–1.35)
GFR calc Af Amer: >90 mL/min (ref >90)
GFR calc non Af Amer: >90 mL/min (ref >90)
Glucose, Bld: 107 mg/dL — ABNORMAL HIGH (ref 70–99)
Potassium: 3.3 mEq/L — ABNORMAL LOW (ref 3.7–5.3)
Sodium: 138 mEq/L (ref 137–147)
Total Bilirubin: 0.7 mg/dL (ref 0.3–1.2)
Total Protein: 5.8 g/dL — ABNORMAL LOW (ref 6.0–8.3)

## 2013-12-09 LAB — CBC
HCT: 29.7 % — ABNORMAL LOW (ref 39.0–52.0)
Hemoglobin: 9.9 g/dL — ABNORMAL LOW (ref 13.0–17.0)
MCH: 28.7 pg (ref 26.0–34.0)
MCHC: 33.3 g/dL (ref 30.0–36.0)
MCV: 86.1 fL (ref 78.0–100.0)
Platelets: 429 10*3/uL — ABNORMAL HIGH (ref 150–400)
RBC: 3.45 MIL/uL — ABNORMAL LOW (ref 4.22–5.81)
RDW: 14.3 % (ref 11.5–15.5)
WBC: 8.8 10*3/uL (ref 4.0–10.5)

## 2013-12-09 MED ORDER — PANTOPRAZOLE SODIUM 40 MG PO TBEC: 40.0000 mg | DELAYED_RELEASE_TABLET | Freq: Every day | ORAL | Status: DC

## 2013-12-09 MED ORDER — OXYCODONE HCL 5 MG PO TABS: 5.0000 mg | ORAL_TABLET | ORAL | Status: DC | PRN

## 2013-12-09 MED ORDER — URSODIOL 300 MG PO CAPS: 300.0000 mg | ORAL_CAPSULE | Freq: Two times a day (BID) | ORAL | Status: DC

## 2013-12-09 MED ORDER — ONDANSETRON HCL 4 MG PO TABS: 4.0000 mg | ORAL_TABLET | Freq: Four times a day (QID) | ORAL | Status: DC | PRN

## 2013-12-09 MED ORDER — POTASSIUM CHLORIDE CRYS ER 20 MEQ PO TBCR: 40.0000 meq | EXTENDED_RELEASE_TABLET | Freq: Two times a day (BID) | ORAL | Status: DC

## 2013-12-09 MED ORDER — POTASSIUM CHLORIDE CRYS ER 20 MEQ PO TBCR
40.0000 meq | EXTENDED_RELEASE_TABLET | Freq: Two times a day (BID) | ORAL | Status: DC
  Administered 2013-12-09: 40 meq via ORAL
  Filled 2013-12-09 (×2): qty 2

## 2013-12-09 NOTE — Discharge Instructions (Signed)
[facility]      [facility] °[phone] ° °ABDOMINAL SURGERY: POST OP INSTRUCTIONS ° °Always review your discharge instruction sheet given to you by the facility where your surgery was performed. ° °IF YOU HAVE DISABILITY OR FAMILY LEAVE FORMS, YOU MUST BRING THEM TO THE OFFICE FOR PROCESSING.  PLEASE DO NOT GIVE THEM TO YOUR DOCTOR. ° °1. A prescription for pain medication may be given to you upon discharge.  Take your pain medication as prescribed, if needed.  If narcotic pain medicine is not needed, then you may take acetaminophen (Tylenol) or ibuprofen (Advil) as needed. °2. Take your usually prescribed medications unless otherwise directed. °3. If you need a refill on your pain medication, please contact your pharmacy. They will contact our office to request authorization.  Prescriptions will not be filled after 5pm or on week-ends. °4. You should follow a light diet the first few days after arrival home, such as soup and crackers, pudding, etc.unless your doctor has advised otherwise. A high-fiber, low fat diet can be resumed as tolerated.   Be sure to include lots of fluids daily. Most patients will experience some swelling and bruising on the chest and neck area.  Ice packs will help.  Swelling and bruising can take several days to resolve °5. Most patients will experience some swelling and bruising in the area of the incision. Ice pack will help. Swelling and bruising can take several days to resolve..  °6. It is common to experience some constipation if taking pain medication after surgery.  Increasing fluid intake and taking a stool softener will usually help or prevent this problem from occurring.  A mild laxative (Milk of Magnesia or Miralax) should be taken according to package directions if there are no bowel movements after 48 hours. °7.  You may have steri-strips (small skin tapes) in place directly over the incision.  These strips should be left on the skin for 10-14 days.  If your  surgeon used skin glue on the incision, you may shower in 48 hours.  The glue will flake off over the next 2-3 weeks.  Any sutures or staples will be removed at the office during your follow-up visit. You may find that a light gauze bandage over your incision may keep your staples from being rubbed or pulled. You may shower and replace the bandage daily. °8. ACTIVITIES:  You may resume regular (light) daily activities beginning the next day--such as daily self-care, walking, climbing stairs--gradually increasing activities as tolerated.  You may have sexual intercourse when it is comfortable.  Refrain from any heavy lifting or straining until approved by your doctor. °a. You may drive when you no longer are taking prescription pain medication, you can comfortably wear a seatbelt, and you can safely maneuver your car and apply brakes °b. Return to Work: __________8 weeks if applicable_________________________ °9. You should see your doctor in the office for a follow-up appointment approximately two weeks after your surgery.  Make sure that you call for this appointment within a day or two after you arrive home to insure a convenient appointment time. °OTHER INSTRUCTIONS:  °_____________________________________________________________ °_____________________________________________________________ ° °WHEN TO CALL YOUR DOCTOR: °1. Fever over 101.0 °2. Inability to urinate °3. Nausea and/or vomiting °4. Extreme swelling or bruising °5. Continued bleeding from incision. °6. Increased pain, redness, or drainage from the incision. °7. Difficulty swallowing or breathing °8. Muscle cramping or spasms. °9. Numbness or tingling in hands or feet or around lips. ° °The clinic staff is   available to answer your questions during regular business hours.  Please don’t hesitate to call and ask to speak to one of the nurses if you have concerns. ° °For further questions, please visit [email or web address] ° ° ° °

## 2013-12-09 NOTE — Discharge Summary (Signed)
Physician Discharge Summary  Patient ID: Brent Rogers MRN: [id number] DOB/AGE: [birth year] 48 y.o.  Admit date: 12/01/2013 Discharge date: 12/09/2013  Admission Diagnoses: Pancreatic head mass.    Discharge Diagnoses:  Same, secondary to groove pancreatitis  Discharged Condition: stable  Hospital Course:  [name] was admitted to the ICU following whipple.  He was doing well and was transferred to the floor on POD 1.  He had the NGT for 2-3 days.  There was bilious output in the NGT.  Drains remained serosanguinous.  He was able to void spontaneously after foley removal.  He was able to ambulate independently.  His diet was slowly advanced as tolerated.  His bilirubin did rise a bit, so he was started on actigall.  He required a suppository to have BM.  He required 15-20 mg oxycodone for pain control.  His oral intake improved.  His drains were able to be removed.  He was discharged to home in good condition.    Consults: None  Significant Diagnostic Studies: labs: T bili 0.7 prior to d/c.    Treatments: surgery: see above.  Discharge Exam: Blood pressure 115/74, pulse 60, temperature 97.6 F (36.4 C), temperature source Axillary, resp. rate 16, height 6\' 3"  (1.905 m), weight 163 lb 5.8 oz (74.1 kg), SpO2 99.00%. General appearance: alert, cooperative and no distress Resp: breathing comfortably Cardio: regular rate and rhythm GI: soft, non tender, non distended.  Extremities: extremities normal, atraumatic, no cyanosis or edema  Disposition: 01-Home or Self Care  Discharge Orders   Future Orders Complete By Expires   Call MD for:  persistant nausea and vomiting  As directed    Call MD for:  redness, tenderness, or signs of infection (pain, swelling, redness, odor or green/yellow discharge around incision site)  As directed    Call MD for:  severe uncontrolled pain  As directed    Call MD for:  temperature >100.4  As directed    Diet - low sodium heart healthy  As directed     Increase activity slowly  As directed        Medication List         acetaminophen 325 MG tablet  Commonly known as:  TYLENOL  Take 2 tablets (650 mg total) by mouth every 6 (six) hours as needed for mild pain (or Fever >/= 101).     bismuth subsalicylate 262 MG chewable tablet  Commonly known as:  PEPTO BISMOL  Chew 524 mg by mouth as needed for indigestion or diarrhea or loose stools.     ondansetron 4 MG tablet  Commonly known as:  ZOFRAN  Take 1 tablet (4 mg total) by mouth every 6 (six) hours as needed for nausea.     oxyCODONE 5 MG immediate release tablet  Commonly known as:  Oxy IR/ROXICODONE  Take 1-4 tablets (5-20 mg total) by mouth every 4 (four) hours as needed (pain).     pantoprazole 40 MG tablet  Commonly known as:  PROTONIX  Take 1 tablet (40 mg total) by mouth daily at 12 noon.     potassium chloride SA 20 MEQ tablet  Commonly known as:  K-DUR,KLOR-CON  Take 2 tablets (40 mEq total) by mouth 2 (two) times daily.     ursodiol 300 MG capsule  Commonly known as:  ACTIGALL  Take 1 capsule (300 mg total) by mouth 2 (two) times daily.           Follow-up Information   Follow up  with Saraiyah Hemminger, MD. Schedule an appointment as soon as possible for a visit in 2 weeks.   Specialty:  General Surgery   Contact information:   [address] [place] [address] [phone]       Signed: Almond Lint 12/09/2013, 8:44 AM

## 2013-12-09 NOTE — Progress Notes (Signed)
12/09/13 1000 Reviewed discharge instructions with patient. Patient verbalized understanding of discharge instructions. Copy of discharge papers and prescriptions given to patient.

## 2013-12-23 ENCOUNTER — Ambulatory Visit (INDEPENDENT_AMBULATORY_CARE_PROVIDER_SITE_OTHER): Payer: Self-pay | Admitting: General Surgery

## 2013-12-23 ENCOUNTER — Encounter (INDEPENDENT_AMBULATORY_CARE_PROVIDER_SITE_OTHER): Payer: Self-pay | Admitting: General Surgery

## 2013-12-23 VITALS — BP 128/90 | HR 64 | Temp 98.5°F | Resp 18 | Ht 75.0 in | Wt 161.0 lb

## 2013-12-23 DIAGNOSIS — K8681 Exocrine pancreatic insufficiency: Secondary | ICD-10-CM

## 2013-12-23 DIAGNOSIS — K8689 Other specified diseases of pancreas: Secondary | ICD-10-CM | POA: Insufficient documentation

## 2013-12-23 DIAGNOSIS — C250 Malignant neoplasm of head of pancreas: Secondary | ICD-10-CM

## 2013-12-23 MED ORDER — PANCRELIPASE (LIP-PROT-AMYL) 36000-114000 UNITS PO CPEP: 1.0000 | ORAL_CAPSULE | Freq: Three times a day (TID) | ORAL | Status: DC

## 2013-12-23 MED ORDER — OXYCODONE HCL 5 MG PO TABS: 5.0000 mg | ORAL_TABLET | Freq: Four times a day (QID) | ORAL | Status: DC | PRN

## 2013-12-23 NOTE — Patient Instructions (Signed)
Take 1 pill with creon three times/day with meals.  If you develop constipation, stop the creon.    Follow up with me in around 6 weeks.    Call earlier for concerns.

## 2013-12-23 NOTE — Assessment & Plan Note (Signed)
Add creon 1 cap with meals.

## 2013-12-23 NOTE — Progress Notes (Signed)
HISTORY: Pt is a 48 yo M around 3 weeks s/p whipple for pancreatic head mass.  This turned out to be a mass from chronic pancreatitis.  He is doing well overall.  He is having good days and bad days.  He denies nausea and vomiting.  No fever/chills.  He is having some crampy abdominal pain and having issues gaining weight.      EXAM: General:  Alert and oriented.  No distress.  Incision:  Healing well.  No open areas.     PATHOLOGY: Diagnosis 1. Whipple procedure/resection, Pancreaticoduodenectomy - FINDINGS CONSISTENT WITH GROOVE PANCREATITIS. - NEGATIVE FOR EVIDENCE OF MALIGNANCY. - THREE LYMPH NODES WITH NO SIGNIFICANT PATHOLOGIC ALTERATION. - OUTSIDE CONSULTATION DIAGNOSIS. 2. Gallbladder - MILD CHRONIC CHOLECYSTITIS. 3. Lymph node, biopsy, hepatic artery - ONE BENIGN LYMPH NODE (0/1).   ASSESSMENT AND PLAN:   Chronic pancreatitis with mass Benign pathology  Follow up in 6 weeks.    Exocrine pancreatic insufficiency Add creon 1 cap with meals.       Milus Height, MD Surgical Oncology, [facility]  No PCP Per Patient No ref. provider found

## 2013-12-23 NOTE — Assessment & Plan Note (Signed)
Benign pathology  Follow up in 6 weeks.

## 2014-01-20 ENCOUNTER — Telehealth (INDEPENDENT_AMBULATORY_CARE_PROVIDER_SITE_OTHER): Payer: Self-pay

## 2014-01-20 DIAGNOSIS — C250 Malignant neoplasm of head of pancreas: Secondary | ICD-10-CM

## 2014-01-20 MED ORDER — OXYCODONE HCL 5 MG PO TABS: 5.0000 mg | ORAL_TABLET | Freq: Four times a day (QID) | ORAL | Status: DC | PRN

## 2014-01-20 NOTE — Telephone Encounter (Signed)
Written rx for Oxycodone 5mg  #50 signed by Dr Johney Maine for Dr Barry Dienes. Rx at front desk for p/u.

## 2014-01-20 NOTE — Telephone Encounter (Signed)
Patient states he going to run out Oxycodone 5mg  on Monday, rates his pain 5 without oxy. Advised him to try ibuprofen , he will need to start weaning  off the narcotics , but I would ask the urg MD today and we would contact him.

## 2014-01-20 NOTE — Addendum Note (Signed)
Addended by: Illene Regulus on: 01/20/2014 02:22 PM   Modules accepted: Orders

## 2014-01-20 NOTE — Telephone Encounter (Signed)
LMOM asking pt to return my call. This is so that I may inform him that he has a Rx ready for pickup at our front desk.

## 2014-01-20 NOTE — Telephone Encounter (Signed)
Patient returned my call and message below was delivered.

## 2014-02-03 ENCOUNTER — Encounter (INDEPENDENT_AMBULATORY_CARE_PROVIDER_SITE_OTHER): Payer: Self-pay | Admitting: General Surgery

## 2014-02-03 ENCOUNTER — Ambulatory Visit (INDEPENDENT_AMBULATORY_CARE_PROVIDER_SITE_OTHER): Payer: Self-pay | Admitting: General Surgery

## 2014-02-03 VITALS — BP 110/62 | HR 80 | Temp 97.5°F | Resp 14 | Wt 147.6 lb

## 2014-02-03 DIAGNOSIS — K861 Other chronic pancreatitis: Secondary | ICD-10-CM

## 2014-02-03 DIAGNOSIS — K8689 Other specified diseases of pancreas: Secondary | ICD-10-CM

## 2014-02-03 DIAGNOSIS — K8681 Exocrine pancreatic insufficiency: Secondary | ICD-10-CM

## 2014-02-03 NOTE — Progress Notes (Signed)
HISTORY: Pt is a 48 yo M around 10 weeks s/p whipple who is continuing to have difficulty with weight loss.  He is taking the 36k creon with meals and is having horrible abdominal pain with each meal.  He thinks that he has more diarrhea when he takes the creon.  His incisional pain has resolved.     PERTINENT REVIEW OF SYSTEMS: Diarrhea.    Filed Vitals:   02/03/14 1038  BP: 110/62  Pulse: 80  Temp: 97.5 F (36.4 C)  Resp: 14   Wt Readings from Last 3 Encounters:  02/03/14 147 lb 9.6 oz (66.951 kg)  12/23/13 161 lb (73.029 kg)  12/04/13 163 lb 5.8 oz (74.1 kg)    EXAM: Head: Normocephalic and atraumatic.  Eyes:  Conjunctivae are normal. Pupils are equal, round, and reactive to light. No scleral icterus.  Resp: No respiratory distress, normal effort. Abd:  Abdomen is soft, non distended and non tender. No masses are palpable.  There is no rebound and no guarding.  Neurological: Alert and oriented to person, place, and time. Coordination normal.  Skin: Skin is warm and dry. No rash noted. No diaphoretic. No erythema. No pallor.  Psychiatric: Normal mood and affect. Normal behavior. Judgment and thought content normal.    ASSESSMENT AND PLAN:   Exocrine pancreatic insufficiency I do not understand why pt has paradoxical diarrhea with taking high dose of creon.  However, pt does appear to have panc insufficiency given continued weight loss since whipple.    I have given him samples of the 24k dose of creon and will see him back in 4 - 6 weeks.    Chronic pancreatitis with mass Stop oxycodone and take tylenol as needed.        Milus Height, MD Surgical Oncology, [facility]  No PCP Per Patient No ref. provider found

## 2014-02-03 NOTE — Patient Instructions (Addendum)
Take smaller dose of creon with each meal.    Follow up in 4-6 weeks.  Stop oxycodone.  Use tylenol 2 tabs 3 x/day as needed.

## 2014-02-03 NOTE — Assessment & Plan Note (Signed)
Stop oxycodone and take tylenol as needed.

## 2014-02-03 NOTE — Assessment & Plan Note (Signed)
I do not understand why pt has paradoxical diarrhea with taking high dose of creon.  However, pt does appear to have panc insufficiency given continued weight loss since whipple.    I have given him samples of the 24k dose of creon and will see him back in 4 - 6 weeks.

## 2014-04-24 ENCOUNTER — Emergency Department (HOSPITAL_COMMUNITY): Payer: Self-pay

## 2014-04-24 ENCOUNTER — Emergency Department (HOSPITAL_COMMUNITY)
Admission: EM | Admit: 2014-04-24 | Discharge: 2014-04-24 | Disposition: A | Discharge status: Home / Self Care | Payer: Self-pay | Attending: Emergency Medicine | Admitting: Emergency Medicine

## 2014-04-24 ENCOUNTER — Encounter (HOSPITAL_COMMUNITY): Payer: Self-pay | Admitting: Emergency Medicine

## 2014-04-24 DIAGNOSIS — G8929 Other chronic pain: Secondary | ICD-10-CM | POA: Insufficient documentation

## 2014-04-24 DIAGNOSIS — R1032 Left lower quadrant pain: Secondary | ICD-10-CM | POA: Insufficient documentation

## 2014-04-24 DIAGNOSIS — M25561 Pain in right knee: Secondary | ICD-10-CM

## 2014-04-24 DIAGNOSIS — Z79899 Other long term (current) drug therapy: Secondary | ICD-10-CM | POA: Insufficient documentation

## 2014-04-24 DIAGNOSIS — R197 Diarrhea, unspecified: Secondary | ICD-10-CM

## 2014-04-24 DIAGNOSIS — M25569 Pain in unspecified knee: Secondary | ICD-10-CM | POA: Insufficient documentation

## 2014-04-24 DIAGNOSIS — R109 Unspecified abdominal pain: Secondary | ICD-10-CM

## 2014-04-24 DIAGNOSIS — Q453 Other congenital malformations of pancreas and pancreatic duct: Secondary | ICD-10-CM | POA: Insufficient documentation

## 2014-04-24 DIAGNOSIS — F17200 Nicotine dependence, unspecified, uncomplicated: Secondary | ICD-10-CM | POA: Insufficient documentation

## 2014-04-24 LAB — CBC WITH DIFFERENTIAL/PLATELET
Basophils Absolute: 0.1 10*3/uL (ref 0.0–0.1)
Basophils Relative: 1 % (ref 0–1)
Eosinophils Absolute: 0 10*3/uL (ref 0.0–0.7)
Eosinophils Relative: 1 % (ref 0–5)
HCT: 47.6 % (ref 39.0–52.0)
Hemoglobin: 15.9 g/dL (ref 13.0–17.0)
Lymphocytes Relative: 28 % (ref 12–46)
Lymphs Abs: 1.6 10*3/uL (ref 0.7–4.0)
MCH: 29.4 pg (ref 26.0–34.0)
MCHC: 33.4 g/dL (ref 30.0–36.0)
MCV: 88.1 fL (ref 78.0–100.0)
Monocytes Absolute: 0.7 10*3/uL (ref 0.1–1.0)
Monocytes Relative: 12 % (ref 3–12)
Neutro Abs: 3.4 10*3/uL (ref 1.7–7.7)
Neutrophils Relative %: 58 % (ref 43–77)
Platelets: 255 10*3/uL (ref 150–400)
RBC: 5.4 MIL/uL (ref 4.22–5.81)
RDW: 15.5 % (ref 11.5–15.5)
WBC: 5.7 10*3/uL (ref 4.0–10.5)

## 2014-04-24 LAB — COMPREHENSIVE METABOLIC PANEL
ALT: 30 U/L (ref 0–53)
AST: 44 U/L — ABNORMAL HIGH (ref 0–37)
Albumin: 4.1 g/dL (ref 3.5–5.2)
Alkaline Phosphatase: 98 U/L (ref 39–117)
Anion gap: 14 (ref 5–15)
BUN: 8 mg/dL (ref 6–23)
CO2: 25 mEq/L (ref 19–32)
Calcium: 10 mg/dL (ref 8.4–10.5)
Chloride: 103 mEq/L (ref 96–112)
Creatinine, Ser: 0.92 mg/dL (ref 0.50–1.35)
GFR calc Af Amer: >90 mL/min (ref >90)
GFR calc non Af Amer: >90 mL/min (ref >90)
Glucose, Bld: 124 mg/dL — ABNORMAL HIGH (ref 70–99)
Potassium: 4.3 mEq/L (ref 3.7–5.3)
Sodium: 142 mEq/L (ref 137–147)
Total Bilirubin: 0.7 mg/dL (ref 0.3–1.2)
Total Protein: 7.4 g/dL (ref 6.0–8.3)

## 2014-04-24 LAB — URINE MICROSCOPIC-ADD ON

## 2014-04-24 LAB — URINALYSIS, ROUTINE W REFLEX MICROSCOPIC
Glucose, UA: NEGATIVE mg/dL
Hgb urine dipstick: NEGATIVE
Ketones, ur: 15 mg/dL — AB
Nitrite: NEGATIVE
Protein, ur: 30 mg/dL — AB
Specific Gravity, Urine: 1.034 — ABNORMAL HIGH (ref 1.005–1.030)
Urobilinogen, UA: 1 mg/dL (ref 0.0–1.0)
pH: 5.5 (ref 5.0–8.0)

## 2014-04-24 LAB — LIPASE, BLOOD: Lipase: 13 U/L (ref 11–59)

## 2014-04-24 MED ORDER — SODIUM CHLORIDE 0.9 % IV BOLUS (SEPSIS)
1000.0000 mL | INTRAVENOUS | Status: AC
  Administered 2014-04-24: 1000 mL via INTRAVENOUS

## 2014-04-24 MED ORDER — IOHEXOL 300 MG/ML  SOLN
100.0000 mL | Freq: Once | INTRAMUSCULAR | Status: AC | PRN
  Administered 2014-04-24: 100 mL via INTRAVENOUS

## 2014-04-24 MED ORDER — HYDROMORPHONE HCL PF 1 MG/ML IJ SOLN
1.0000 mg | Freq: Once | INTRAMUSCULAR | Status: AC
  Administered 2014-04-24: 1 mg via INTRAVENOUS
  Filled 2014-04-24: qty 1

## 2014-04-24 MED ORDER — OXYCODONE-ACETAMINOPHEN 5-325 MG PO TABS: 1.0000 | ORAL_TABLET | Freq: Four times a day (QID) | ORAL | Status: DC | PRN

## 2014-04-24 MED ORDER — IOHEXOL 300 MG/ML  SOLN
50.0000 mL | Freq: Once | INTRAMUSCULAR | Status: AC | PRN
  Administered 2014-04-24: 50 mL via ORAL

## 2014-04-24 NOTE — ED Notes (Signed)
Pt ret from radiology at this time, reports pain improved.  Denies further needs/complaints currently.

## 2014-04-24 NOTE — ED Notes (Signed)
Initial Contact - pt to RM12 with c/o LLQ abd pain rad into L flank.  Pt reports similar pain prior to having a large mass removed from his pancreas in March.  Pt reports pain has been intermittent since surgery, but constant and worsening over the last 4 days.  Pt reports +diarrhea "food goes right through me", denies n/v, denies fevers/chills.  Skin PWD.  MAEI, ambulatory with steady gait.  Pt also reports 7/10 R knee pain "pops and gives out".  Pt denies injury. +csm/+pulses.  NAD.

## 2014-04-24 NOTE — ED Notes (Signed)
Pt to CT

## 2014-04-24 NOTE — ED Notes (Signed)
Pt c/o left flank pain that radiates to left side of abd x couple days.  Pt states that the pain feels similar to the pain he had back before he had tumor removed of pancreas in March 2015.  Pt also c/o right knee pain for past couple of days. Pt states that the pain is worse when he bears weight on it. Pt states that when he sits for long periods of time and then move it it pops a lot.

## 2014-04-24 NOTE — ED Notes (Signed)
Provided pt with urinal. Pt states he is going to try and void and will notify when complete

## 2014-04-24 NOTE — Progress Notes (Signed)
[facility] provided pt with a list of primary care resources, highlighting [facility]. Patient stated that he was in the process of getting [place] with [facility].

## 2014-04-24 NOTE — Discharge Instructions (Signed)

## 2014-04-24 NOTE — ED Notes (Signed)
Pt requesting Pain meds

## 2014-04-24 NOTE — ED Provider Notes (Signed)
CSN: [id number]     Arrival date & time 04/24/14  1153 History   First MD Initiated Contact with Patient 04/24/14 1455     Chief Complaint  Patient presents with  . Flank Pain  . Knee Pain     (Consider location/radiation/quality/duration/timing/severity/associated sxs/prior Treatment) Patient is a 48 y.o. male presenting with flank pain and knee pain. The history is provided by the patient.  Flank Pain This is a new problem. Episode onset: 3 days ago. The problem occurs constantly. The problem has been gradually worsening. Associated symptoms include abdominal pain. Pertinent negatives include no chest pain, no headaches and no shortness of breath. The symptoms are aggravated by bending. Nothing relieves the symptoms. He has tried acetaminophen for the symptoms. The treatment provided mild relief.  Knee Pain Location:  Knee Time since incident:  5 months Injury: no   Knee location:  R knee Pain details:    Quality:  Aching   Radiates to:  Does not radiate   Severity:  Mild   Onset quality:  Gradual   Timing:  Constant   Progression:  Worsening Chronicity:  Chronic Dislocation: no   Foreign body present:  No foreign bodies Relieved by:  Nothing Worsened by:  Activity Associated symptoms: no fever and no neck pain     Past Medical History  Diagnosis Date  . Scoliosis   . Pancreatic abnormality     CT  shows mass   Past Surgical History  Procedure Laterality Date  . Lumbar disc surgery  1990's  . Finger fracture surgery Left 1995    5th digit  . Eus N/A 09/21/2013    Procedure: ESOPHAGEAL ENDOSCOPIC ULTRASOUND (EUS) RADIAL;  Surgeon: Beryle Beams, MD;  Location: [facility];  Service: Endoscopy;  Laterality: N/A;  . Fine needle aspiration N/A 09/21/2013    Procedure: FINE NEEDLE ASPIRATION (FNA) LINEAR;  Surgeon: Beryle Beams, MD;  Location: [facility];  Service: Endoscopy;  Laterality: N/A;  . Back surgery  yrs ago    lower  . Eus N/A 09/30/2013    Procedure:  UPPER ENDOSCOPIC ULTRASOUND (EUS) LINEAR;  Surgeon: Beryle Beams, MD;  Location: [facility];  Service: Endoscopy;  Laterality: N/A;  . Laparoscopy N/A 12/01/2013    Procedure: LAPAROSCOPY DIAGNOSTIC PANCREATICODUODENECTOMY WITH BILIARY AND PANCREATIC STENTS;  Surgeon: Stark Klein, MD;  Location: [facility];  Service: General;  Laterality: N/A;  . Whipple procedure     Family History  Problem Relation Age of Onset  . Cancer Mother     unsure   History  Substance Use Topics  . Smoking status: Current Every Day Smoker -- 0.25 packs/day for 10 years    Types: Cigarettes  . Smokeless tobacco: Former Systems developer    Types: Snuff     Comment: 09/21/2013 "smoke < 1 pack cigarettes q 2 wks"  . Alcohol Use: Yes     Comment: 09/21/2013 "drink a beer couple times/month"    Review of Systems  Constitutional: Negative for fever.  HENT: Negative for drooling and rhinorrhea.   Eyes: Negative for pain.  Respiratory: Negative for cough and shortness of breath.   Cardiovascular: Negative for chest pain and leg swelling.  Gastrointestinal: Positive for abdominal pain and diarrhea. Negative for nausea and vomiting.  Genitourinary: Positive for flank pain. Negative for dysuria and hematuria.  Musculoskeletal: Negative for gait problem and neck pain.  Skin: Negative for color change.  Neurological: Negative for numbness and headaches.  Hematological: Negative for adenopathy.  Psychiatric/Behavioral: Negative  for behavioral problems.  All other systems reviewed and are negative.     Allergies  Review of patient's allergies indicates no known allergies.  Home Medications   Prior to Admission medications   Medication Sig Start Date End Date Taking? Authorizing Provider  acetaminophen (TYLENOL) 325 MG tablet Take 650 mg by mouth every 6 (six) hours as needed for moderate pain.   Yes Historical Provider, MD  bismuth subsalicylate (PEPTO BISMOL) 262 MG/15ML suspension Take 30 mLs by mouth every 6 (six)  hours as needed for indigestion or diarrhea or loose stools.   Yes Historical Provider, MD  Pancrelipase, Lip-Prot-Amyl, 24000 UNITS CPEP Take 1 capsule by mouth 3 (three) times daily with meals.   Yes Historical Provider, MD   BP 125/90  Pulse 59  Temp(Src) 98.3 F (36.8 C) (Oral)  Resp 16  Ht 6\' 3"  (1.905 m)  Wt 144 lb (65.318 kg)  BMI 18.00 kg/m2  SpO2 100% Physical Exam  Nursing note and vitals reviewed. Constitutional: He is oriented to person, place, and time. He appears well-developed and well-nourished.  HENT:  Head: Normocephalic and atraumatic.  Right Ear: External ear normal.  Left Ear: External ear normal.  Nose: Nose normal.  Mouth/Throat: Oropharynx is clear and moist. No oropharyngeal exudate.  Eyes: Conjunctivae and EOM are normal. Pupils are equal, round, and reactive to light.  Neck: Normal range of motion. Neck supple.  Cardiovascular: Normal rate, regular rhythm, normal heart sounds and intact distal pulses.  Exam reveals no gallop and no friction rub.   No murmur heard. Pulmonary/Chest: Effort normal and breath sounds normal. No respiratory distress. He has no wheezes.  Abdominal: Soft. Bowel sounds are normal. He exhibits no distension. There is tenderness (mild left lower quadrant and left lumbar paraspinal tenderness to palpation.). There is no rebound and no guarding.  Musculoskeletal: Normal range of motion. He exhibits no edema.  Tenderness to palpation of the right medial knee. The patient has normal range of motion of the right knee with a locking sensation when flexing the knee. Appearance of the knee is otherwise unremarkable.  No significant CVA tenderness bilaterally.  Neurological: He is alert and oriented to person, place, and time.  Skin: Skin is warm and dry.  Psychiatric: He has a normal mood and affect. His behavior is normal.    ED Course  Procedures (including critical care time) Labs Review Labs Reviewed  COMPREHENSIVE METABOLIC PANEL  - Abnormal; Notable for the following:    Glucose, Bld 124 (*)    AST 44 (*)    All other components within normal limits  URINALYSIS, ROUTINE W REFLEX MICROSCOPIC - Abnormal; Notable for the following:    Color, Urine AMBER (*)    APPearance CLOUDY (*)    Specific Gravity, Urine 1.034 (*)    Bilirubin Urine SMALL (*)    Ketones, ur 15 (*)    Protein, ur 30 (*)    Leukocytes, UA TRACE (*)    All other components within normal limits  URINE MICROSCOPIC-ADD ON - Abnormal; Notable for the following:    Casts HYALINE CASTS (*)    Crystals CA OXALATE CRYSTALS (*)    All other components within normal limits  CBC WITH DIFFERENTIAL  LIPASE, BLOOD    Imaging Review Ct Abdomen Pelvis W Contrast  04/24/2014   CLINICAL DATA:  Left lower quadrant abdominal pain radiating into the left flank. Diarrhea. 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  EXAM:  CT ABDOMEN AND PELVIS WITH CONTRAST  TECHNIQUE: Multidetector CT imaging of the abdomen and pelvis was performed using the standard protocol following bolus administration of intravenous contrast.  CONTRAST:  115mL OMNIPAQUE IOHEXOL 300 MG/ML IV. Oral contrast was also administered.  COMPARISON:  Preoperative CT abdomen and pelvis 09/18/2013 and preoperative MRI abdomen 10/30/2013.  FINDINGS: Prior cholecystectomy and resection of the pancreatic head and duodenum, with apparent Billroth 2 gastrojejunostomy and choledochojejunostomy. Widely patent gastrojejunal anastomosis. No complicating postoperative features. Small bowel normal in appearance. Large stool burden throughout the colon. Transverse colon relatively decompressed which I believe accounts for the apparent wall thickening. Normal-appearing long appendix in the right mid and upper pelvis. No ascites.  Diffuse hepatic steatosis without focal hepatic parenchymal abnormality. Normal-appearing spleen,  adrenal glands, and remaining portions of the pancreas. Stable cortical cysts in both kidneys; no significant abnormality involving either kidney. Mild to moderate aortoiliac atherosclerosis without aneurysm. No significant lymphadenopathy.  Urinary bladder decompressed and unremarkable. Prostate gland and seminal vesicles normal for age. Phleboliths low in both sides of the pelvis.  Bone window images demonstrate degenerative disc disease at L4-5 and L5-S1. Visualized lung bases clear. Heart size normal.  IMPRESSION: 1. No acute abnormalities involving the abdomen or pelvis. 2. No complicating features after pancreaticoduodenectomy and cholecystectomy. 3. Diffuse hepatic steatosis without focal hepatic parenchymal abnormality. 4. Mild to moderate aortoiliac atherosclerosis, advanced for age.   Electronically Signed   By: Evangeline Dakin M.D.   On: 04/24/2014 17:35   Dg Knee Complete 4 Views Right  04/24/2014   CLINICAL DATA:  RIGHT knee pain without injury, knee locks  EXAM: RIGHT KNEE - COMPLETE 4+ VIEW  COMPARISON:  None  FINDINGS: Osseous demineralization.  Minimal diffuse joint space narrowing.  Exostosis noted at the lateral margin of distal femoral diaphysis.  No acute fracture, dislocation, or bone destruction.  No knee joint effusion.  IMPRESSION: Osseous demineralization with minimal degenerative changes RIGHT knee.  No acute abnormalities.  Incidental distal RIGHT femoral exostosis.   Electronically Signed   By: Lavonia Dana M.D.   On: 04/24/2014 16:11     EKG Interpretation None      MDM   Final diagnoses:  Right knee pain  Left flank pain  Diarrhea    3:09 PM 48 y.o. male with a history of pancreatic mass removal in March of 2015 who presents with left flank pain and diarrhea which began 3 days ago. He denies any fevers or vomiting. He is afebrile and vital signs are unremarkable here. Labs are thus far noncontributory. Will add on some screening labwork, get pain control w/ dilaudid,  and get a CT of his abd.   6:51 PM: Mild ketones in the UA. Labs/imaging otherwise interpreted and unremarkable. Pain controlled. Will give Rx for pain meds for home. Referred to ortho for chronic knee pain. I have discussed the diagnosis/risks/treatment options with the patient and believe the pt to be eligible for discharge home to follow-up with pcp as needed. We also discussed returning to the ED immediately if new or worsening sx occur. We discussed the sx which are most concerning (e.g., worsening pain or diarrhea, blood in stool, fever) that necessitate immediate return. Medications administered to the patient during their visit and any new prescriptions provided to the patient are listed below.  Medications given during this visit Medications  HYDROmorphone (DILAUDID) injection 1 mg (1 mg Intravenous Given 04/24/14 1535)  sodium chloride 0.9 % bolus 1,000 mL (0 mLs Intravenous Stopped 04/24/14 1635)  iohexol (OMNIPAQUE) 300 MG/ML solution 50 mL (50 mLs Oral Contrast Given 04/24/14 1711)  iohexol (OMNIPAQUE) 300 MG/ML solution 100 mL (100 mLs Intravenous Contrast Given 04/24/14 1711)  HYDROmorphone (DILAUDID) injection 1 mg (1 mg Intravenous Given 04/24/14 1805)    New Prescriptions   OXYCODONE-ACETAMINOPHEN (PERCOCET) 5-325 MG PER TABLET    Take 1 tablet by mouth every 6 (six) hours as needed for moderate pain.     Blanchard Kelch, MD 04/24/14 [phone]

## 2014-05-09 ENCOUNTER — Encounter (HOSPITAL_COMMUNITY): Payer: Self-pay

## 2014-06-06 ENCOUNTER — Encounter (HOSPITAL_COMMUNITY): Payer: Self-pay | Admitting: Emergency Medicine

## 2014-06-06 ENCOUNTER — Inpatient Hospital Stay (HOSPITAL_COMMUNITY)
Admission: EM | Admit: 2014-06-06 | Discharge: 2014-06-09 | DRG: 438 | Disposition: A | Discharge status: Home / Self Care | Payer: Self-pay | Attending: Internal Medicine | Admitting: Internal Medicine

## 2014-06-06 ENCOUNTER — Emergency Department (HOSPITAL_COMMUNITY): Payer: Self-pay

## 2014-06-06 DIAGNOSIS — R112 Nausea with vomiting, unspecified: Secondary | ICD-10-CM | POA: Diagnosis present

## 2014-06-06 DIAGNOSIS — C250 Malignant neoplasm of head of pancreas: Secondary | ICD-10-CM

## 2014-06-06 DIAGNOSIS — R1115 Cyclical vomiting syndrome unrelated to migraine: Secondary | ICD-10-CM

## 2014-06-06 DIAGNOSIS — E43 Unspecified severe protein-calorie malnutrition: Secondary | ICD-10-CM | POA: Diagnosis present

## 2014-06-06 DIAGNOSIS — R1900 Intra-abdominal and pelvic swelling, mass and lump, unspecified site: Secondary | ICD-10-CM

## 2014-06-06 DIAGNOSIS — F17200 Nicotine dependence, unspecified, uncomplicated: Secondary | ICD-10-CM | POA: Diagnosis present

## 2014-06-06 DIAGNOSIS — K859 Acute pancreatitis without necrosis or infection, unspecified: Principal | ICD-10-CM | POA: Diagnosis present

## 2014-06-06 DIAGNOSIS — K860 Alcohol-induced chronic pancreatitis: Secondary | ICD-10-CM

## 2014-06-06 DIAGNOSIS — Z681 Body mass index (BMI) 19 or less, adult: Secondary | ICD-10-CM

## 2014-06-06 DIAGNOSIS — R1013 Epigastric pain: Secondary | ICD-10-CM

## 2014-06-06 DIAGNOSIS — K8681 Exocrine pancreatic insufficiency: Secondary | ICD-10-CM

## 2014-06-06 DIAGNOSIS — Z79899 Other long term (current) drug therapy: Secondary | ICD-10-CM

## 2014-06-06 DIAGNOSIS — R1110 Vomiting, unspecified: Secondary | ICD-10-CM

## 2014-06-06 DIAGNOSIS — R109 Unspecified abdominal pain: Secondary | ICD-10-CM | POA: Diagnosis present

## 2014-06-06 DIAGNOSIS — K861 Other chronic pancreatitis: Secondary | ICD-10-CM | POA: Diagnosis present

## 2014-06-06 DIAGNOSIS — IMO0002 Reserved for concepts with insufficient information to code with codable children: Secondary | ICD-10-CM | POA: Diagnosis present

## 2014-06-06 DIAGNOSIS — R1012 Left upper quadrant pain: Secondary | ICD-10-CM

## 2014-06-06 LAB — COMPREHENSIVE METABOLIC PANEL
ALT: 29 U/L (ref 0–53)
AST: 44 U/L — ABNORMAL HIGH (ref 0–37)
Albumin: 4.3 g/dL (ref 3.5–5.2)
Alkaline Phosphatase: 111 U/L (ref 39–117)
Anion gap: 20 — ABNORMAL HIGH (ref 5–15)
BUN: 11 mg/dL (ref 6–23)
CO2: 23 mEq/L (ref 19–32)
Calcium: 9.7 mg/dL (ref 8.4–10.5)
Chloride: 95 mEq/L — ABNORMAL LOW (ref 96–112)
Creatinine, Ser: 0.74 mg/dL (ref 0.50–1.35)
GFR calc Af Amer: >90 mL/min (ref >90)
GFR calc non Af Amer: >90 mL/min (ref >90)
Glucose, Bld: 164 mg/dL — ABNORMAL HIGH (ref 70–99)
Potassium: 4.2 mEq/L (ref 3.7–5.3)
Sodium: 138 mEq/L (ref 137–147)
Total Bilirubin: 1.2 mg/dL (ref 0.3–1.2)
Total Protein: 7.6 g/dL (ref 6.0–8.3)

## 2014-06-06 LAB — CBC WITH DIFFERENTIAL/PLATELET
Basophils Absolute: 0.1 10*3/uL (ref 0.0–0.1)
Basophils Relative: 1 % (ref 0–1)
Eosinophils Absolute: 0 10*3/uL (ref 0.0–0.7)
Eosinophils Relative: 0 % (ref 0–5)
HCT: 47.1 % (ref 39.0–52.0)
Hemoglobin: 16.2 g/dL (ref 13.0–17.0)
Lymphocytes Relative: 10 % — ABNORMAL LOW (ref 12–46)
Lymphs Abs: 1.1 10*3/uL (ref 0.7–4.0)
MCH: 30.3 pg (ref 26.0–34.0)
MCHC: 34.4 g/dL (ref 30.0–36.0)
MCV: 88.2 fL (ref 78.0–100.0)
Monocytes Absolute: 0.8 10*3/uL (ref 0.1–1.0)
Monocytes Relative: 7 % (ref 3–12)
Neutro Abs: 9.3 10*3/uL — ABNORMAL HIGH (ref 1.7–7.7)
Neutrophils Relative %: 82 % — ABNORMAL HIGH (ref 43–77)
Platelets: 259 10*3/uL (ref 150–400)
RBC: 5.34 MIL/uL (ref 4.22–5.81)
RDW: 15.1 % (ref 11.5–15.5)
WBC: 11.2 10*3/uL — ABNORMAL HIGH (ref 4.0–10.5)

## 2014-06-06 LAB — URINALYSIS, ROUTINE W REFLEX MICROSCOPIC
Glucose, UA: NEGATIVE mg/dL
Hgb urine dipstick: NEGATIVE
Ketones, ur: >80 mg/dL — AB
Leukocytes, UA: NEGATIVE
Nitrite: NEGATIVE
Protein, ur: 30 mg/dL — AB
Specific Gravity, Urine: 1.03 (ref 1.005–1.030)
Urobilinogen, UA: 1 mg/dL (ref 0.0–1.0)
pH: 6 (ref 5.0–8.0)

## 2014-06-06 LAB — URINE MICROSCOPIC-ADD ON

## 2014-06-06 LAB — LIPASE, BLOOD: Lipase: 233 U/L — ABNORMAL HIGH (ref 11–59)

## 2014-06-06 MED ORDER — HYDROMORPHONE HCL PF 1 MG/ML IJ SOLN
1.0000 mg | Freq: Once | INTRAMUSCULAR | Status: AC
  Administered 2014-06-06: 1 mg via INTRAVENOUS
  Filled 2014-06-06: qty 1

## 2014-06-06 MED ORDER — ENOXAPARIN SODIUM 40 MG/0.4ML ~~LOC~~ SOLN
40.0000 mg | Freq: Every day | SUBCUTANEOUS | Status: DC
  Administered 2014-06-06 – 2014-06-08 (×3): 40 mg via SUBCUTANEOUS
  Filled 2014-06-06 (×6): qty 0.4

## 2014-06-06 MED ORDER — ONDANSETRON HCL 4 MG/2ML IJ SOLN
4.0000 mg | Freq: Once | INTRAMUSCULAR | Status: AC
  Administered 2014-06-06: 4 mg via INTRAVENOUS
  Filled 2014-06-06: qty 2

## 2014-06-06 MED ORDER — ONDANSETRON HCL 4 MG PO TABS
4.0000 mg | ORAL_TABLET | Freq: Four times a day (QID) | ORAL | Status: DC | PRN
Start: 2014-06-06 — End: 2014-06-09

## 2014-06-06 MED ORDER — HYDROMORPHONE HCL PF 1 MG/ML IJ SOLN
1.0000 mg | INTRAMUSCULAR | Status: DC | PRN
  Administered 2014-06-06: 1 mg via INTRAVENOUS
  Filled 2014-06-06: qty 1

## 2014-06-06 MED ORDER — FENTANYL CITRATE 0.05 MG/ML IJ SOLN
50.0000 ug | Freq: Once | INTRAMUSCULAR | Status: AC
  Administered 2014-06-06: 50 ug via INTRAVENOUS
  Filled 2014-06-06: qty 2

## 2014-06-06 MED ORDER — SODIUM CHLORIDE 0.9 % IV BOLUS (SEPSIS)
1000.0000 mL | Freq: Once | INTRAVENOUS | Status: AC
  Administered 2014-06-06: 1000 mL via INTRAVENOUS

## 2014-06-06 MED ORDER — KETOROLAC TROMETHAMINE 30 MG/ML IJ SOLN
30.0000 mg | Freq: Once | INTRAMUSCULAR | Status: AC
  Administered 2014-06-06: 30 mg via INTRAVENOUS
  Filled 2014-06-06: qty 1

## 2014-06-06 MED ORDER — SODIUM CHLORIDE 0.9 % IV SOLN
INTRAVENOUS | Status: AC
  Administered 2014-06-06 – 2014-06-07 (×2): via INTRAVENOUS

## 2014-06-06 MED ORDER — IOHEXOL 300 MG/ML  SOLN
100.0000 mL | Freq: Once | INTRAMUSCULAR | Status: AC | PRN
  Administered 2014-06-06: 100 mL via INTRAVENOUS

## 2014-06-06 MED ORDER — ONDANSETRON HCL 4 MG/2ML IJ SOLN
4.0000 mg | Freq: Four times a day (QID) | INTRAMUSCULAR | Status: DC | PRN
  Administered 2014-06-06: 4 mg via INTRAVENOUS
  Filled 2014-06-06 (×2): qty 2

## 2014-06-06 MED ORDER — IOHEXOL 300 MG/ML  SOLN
25.0000 mL | Freq: Once | INTRAMUSCULAR | Status: AC | PRN
  Administered 2014-06-06: 25 mL via ORAL

## 2014-06-06 NOTE — ED Provider Notes (Signed)
CSN: [id number]     Arrival date & time 06/06/14  1207 History   First MD Initiated Contact with Patient 06/06/14 1520     Chief Complaint  Patient presents with  . Abdominal Pain    The paitent called EMS due to LUQ abdominal pain.  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  He called EMS because he has severe pain, nuasea and vomiting, and clammy.     (Consider location/radiation/quality/duration/timing/severity/associated sxs/prior Treatment) Patient is a 48 y.o. male presenting with abdominal pain. The history is provided by the patient.  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  Pt states in past 24 hrs, acute onset left abdominal pain, constant, dull, moderate-severe. Non radiating. No specific exacerbating or alleviating factors. Had episode emesis, yellowish, not bloody. Has been having normal bms. No diarrhea or constipation. No rectal bleeding or melena. No dysuria or hematuria. No back pain. Denies fever or chills. No hx diverticulitis.   Past Medical History  Diagnosis Date  . Scoliosis   . Pancreatic abnormality     CT  shows mass   Past Surgical History  Procedure Laterality Date  . Lumbar disc surgery  1990's  . Finger fracture surgery Left 1995    5th digit  . Eus N/A 09/21/2013    Procedure: ESOPHAGEAL ENDOSCOPIC ULTRASOUND (EUS) RADIAL;  Surgeon: Beryle Beams, MD;  Location: [facility];  Service: Endoscopy;  Laterality: N/A;  . Fine needle aspiration N/A 09/21/2013    Procedure: FINE NEEDLE ASPIRATION (FNA) LINEAR;  Surgeon: Beryle Beams, MD;  Location: [facility];  Service: Endoscopy;  Laterality: N/A;  . Back surgery  yrs ago    lower  . Eus N/A  09/30/2013    Procedure: UPPER ENDOSCOPIC ULTRASOUND (EUS) LINEAR;  Surgeon: Beryle Beams, MD;  Location: [facility];  Service: Endoscopy;  Laterality: N/A;  . Laparoscopy N/A 12/01/2013    Procedure: LAPAROSCOPY DIAGNOSTIC PANCREATICODUODENECTOMY WITH BILIARY AND PANCREATIC STENTS;  Surgeon: Stark Klein, MD;  Location: [facility];  Service: General;  Laterality: N/A;  . Whipple procedure     Family History  Problem Relation Age of Onset  . Cancer Mother     unsure   History  Substance Use Topics  . Smoking status: Current Every Day Smoker -- 0.25 packs/day for 10 years    Types: Cigarettes  . Smokeless tobacco: Former Systems developer    Types: Snuff     Comment: 09/21/2013 "smoke < 1 pack cigarettes q 2 wks"  . Alcohol Use: Yes     Comment: 09/21/2013 "drink a beer couple times/month"    Review of Systems  Constitutional: Negative for fever and chills.  HENT: Negative for sore throat.   Eyes: Negative for redness.  Respiratory: Negative for cough and shortness of breath.   Cardiovascular: Negative for chest pain.  Gastrointestinal: Positive for nausea, vomiting and abdominal pain. Negative for diarrhea and constipation.  Genitourinary: Negative for dysuria, hematuria and flank pain.  Musculoskeletal: Negative for back pain and neck pain.  Skin: Negative for rash.  Neurological: Negative for headaches.  Hematological: Does not bruise/bleed easily.  Psychiatric/Behavioral: Negative for confusion.      Allergies  Review of patient's allergies indicates no known allergies.  Home Medications   Prior to Admission medications   Medication Sig Start Date End Date Taking? Authorizing Provider  Pancrelipase, Lip-Prot-Amyl, 24000 UNITS CPEP Take 1 capsule by mouth daily. With a meal    Historical Provider, MD   BP 168/110  Pulse 92  Temp(Src) 98 F (36.7 C) (Oral)  Resp 17  SpO2 100% Physical Exam  Nursing note and vitals reviewed. Constitutional: He is oriented to person, place, and  time. He appears well-developed and well-nourished. No distress.  HENT:  Mouth/Throat: Oropharynx is clear and moist.  Eyes: Conjunctivae are normal. No scleral icterus.  Neck: Neck supple. No tracheal deviation present.  Cardiovascular: Normal rate, regular rhythm, normal heart sounds and intact distal pulses.   Pulmonary/Chest: Effort normal and breath sounds normal. No accessory muscle usage. No respiratory distress.  Abdominal: Soft. Bowel sounds are normal. He exhibits no distension and no mass. There is tenderness. There is no rebound and no guarding.  Well healed surgical scar midline, no incarc hernia. Left mid and lower abd moderate tenderness.   Genitourinary:  No cva tenderness  Musculoskeletal: Normal range of motion. He exhibits no edema and no tenderness.  Neurological: He is alert and oriented to person, place, and time.  Skin: Skin is warm and dry. He is not diaphoretic.  Psychiatric: He has a normal mood and affect.    ED Course  Procedures (including critical care time) Labs Review  Results for orders placed during the hospital encounter of 06/06/14  CBC WITH DIFFERENTIAL      Result Value Ref Range   WBC 11.2 (*) 4.0 - 10.5 K/uL   RBC 5.34  4.22 - 5.81 MIL/uL   Hemoglobin 16.2  13.0 - 17.0 g/dL   HCT 47.1  39.0 - 52.0 %   MCV 88.2  78.0 - 100.0 fL   MCH 30.3  26.0 - 34.0 pg   MCHC 34.4  30.0 - 36.0 g/dL   RDW 15.1  11.5 - 15.5 %   Platelets 259  150 - 400 K/uL   Neutrophils Relative % 82 (*) 43 - 77 %   Neutro Abs 9.3 (*) 1.7 - 7.7 K/uL   Lymphocytes Relative 10 (*) 12 - 46 %   Lymphs Abs 1.1  0.7 - 4.0 K/uL   Monocytes Relative 7  3 - 12 %   Monocytes Absolute 0.8  0.1 - 1.0 K/uL   Eosinophils Relative 0  0 - 5 %   Eosinophils Absolute 0.0  0.0 - 0.7 K/uL   Basophils Relative 1  0 - 1 %   Basophils Absolute 0.1  0.0 - 0.1 K/uL  COMPREHENSIVE METABOLIC PANEL      Result Value Ref Range   Sodium 138  137 - 147 mEq/L   Potassium 4.2  3.7 - 5.3 mEq/L    Chloride 95 (*) 96 - 112 mEq/L   CO2 23  19 - 32 mEq/L   Glucose, Bld 164 (*) 70 - 99 mg/dL   BUN 11  6 - 23 mg/dL   Creatinine, Ser 0.74  0.50 - 1.35 mg/dL   Calcium 9.7  8.4 - 10.5 mg/dL   Total Protein 7.6  6.0 - 8.3 g/dL   Albumin 4.3  3.5 - 5.2 g/dL   AST 44 (*) 0 - 37 U/L   ALT 29  0 - 53 U/L   Alkaline Phosphatase 111  39 - 117 U/L   Total Bilirubin 1.2  0.3 - 1.2 mg/dL   GFR calc non  Af Amer >90  >90 mL/min   GFR calc Af Amer >90  >90 mL/min   Anion gap 20 (*) 5 - 15  LIPASE, BLOOD      Result Value Ref Range   Lipase 233 (*) 11 - 59 U/L       MDM  Iv ns. Labs. Moderate left abd tenderness, will get ct.  Dilaudid iv, zofran iv  Reviewed nursing notes and prior charts for additional history.   Recheck pain improved w iv meds.  Signed out to Dr Mingo Amber at [facility] to check ct when back, recheck pt, dispo appropriately.     Mirna Mires, MD 06/06/14 [phone]

## 2014-06-06 NOTE — ED Notes (Signed)
MD at bedside. 

## 2014-06-06 NOTE — ED Notes (Signed)
Pt finished his contrast. Notified CT

## 2014-06-06 NOTE — ED Notes (Signed)
The paitent called EMS due to LUQ abdominal pain.  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  He called EMS because he has severe pain, nuasea and vomiting, and clammy.  EMS placed an IV in his left AC and gave 131mcg of fentanyl.  He rates his pain 10/10.

## 2014-06-06 NOTE — H&P (Signed)
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  No fevers.  No cough.  No swelling.  No chest pain or sob.  Ct shows acute pancreatitis.  He has not had any issues since his whipple procedure except continued weight loss.  Review of Systems:  Positive and negative as per HPI otherwise all other systems are negative  Past Medical History: Past Medical History  Diagnosis Date  . Scoliosis   . Pancreatic abnormality     CT  shows mass   Past Surgical History  Procedure Laterality Date  . Lumbar disc surgery  1990's  . Finger fracture surgery Left 1995    5th digit  . Eus N/A 09/21/2013    Procedure: ESOPHAGEAL ENDOSCOPIC ULTRASOUND (EUS) RADIAL;  Surgeon: Beryle Beams, MD;  Location: [facility];  Service: Endoscopy;  Laterality: N/A;  . Fine needle aspiration N/A 09/21/2013    Procedure: FINE NEEDLE ASPIRATION (FNA) LINEAR;  Surgeon: Beryle Beams, MD;  Location: [facility];  Service: Endoscopy;  Laterality: N/A;  . Back surgery  yrs ago    lower  . Eus N/A 09/30/2013    Procedure: UPPER ENDOSCOPIC ULTRASOUND (EUS) LINEAR;  Surgeon: Beryle Beams, MD;  Location: [facility];  Service: Endoscopy;  Laterality: N/A;  . Laparoscopy N/A 12/01/2013    Procedure: LAPAROSCOPY DIAGNOSTIC PANCREATICODUODENECTOMY WITH BILIARY AND PANCREATIC STENTS;  Surgeon: Stark Klein, MD;  Location: [facility];  Service: General;  Laterality: N/A;  . Whipple procedure      Medications: Prior to Admission medications   Medication Sig Start Date End Date Taking? Authorizing Provider  Pancrelipase, Lip-Prot-Amyl, 24000 UNITS CPEP Take 1 capsule by mouth daily. With a meal    Historical Provider, MD    Allergies:  No Known Allergies  Social History:  reports that he has been smoking Cigarettes.  He has a 2.5 pack-year smoking history. He has quit using smokeless tobacco. His  smokeless tobacco use included Snuff. He reports that he drinks alcohol. He reports that he does not use illicit drugs.  Family History: Family History  Problem Relation Age of Onset  . Cancer Mother     unsure    Physical Exam: Filed Vitals:   06/06/14 1845 06/06/14 1937 06/06/14 1945 06/06/14 2000  BP: 168/102 155/96 146/103 151/96  Pulse: 64 66 65 62  Temp:  97.8 F (36.6 C)    TempSrc:  Oral    Resp: 13 15 12 13   SpO2: 99% 98% 97% 100%   General appearance: alert, cooperative and no distress Head: Normocephalic, without obvious abnormality, atraumatic Eyes: negative Nose: Nares normal. Septum midline. Mucosa normal. No drainage or sinus tenderness. Neck: no JVD and supple, symmetrical, trachea midline Lungs: clear to auscultation bilaterally Heart: regular rate and rhythm, S1, S2 normal, no murmur, click, rub or gallop Abdomen: soft, non-tender; bowel sounds normal; no masses,  no organomegaly mild tttp luq Extremities: extremities normal, atraumatic, no cyanosis or edema Pulses: 2+ and symmetric Skin: Skin color, texture, turgor normal. No rashes or lesions Neurologic: Grossly normal  Labs on Admission:   Recent Labs  06/06/14 1222  NA 138  K 4.2  CL 95*  CO2 23  GLUCOSE 164*  BUN 11  CREATININE 0.74  CALCIUM 9.7    Recent Labs  06/06/14 1222  AST 44*  ALT 29  ALKPHOS 111  BILITOT 1.2  PROT 7.6  ALBUMIN 4.3    Recent Labs  06/06/14 1222  LIPASE 233*    Recent Labs  06/06/14 1222  WBC 11.2*  NEUTROABS 9.3*  HGB 16.2  HCT 47.1  MCV 88.2  PLT 259   Radiological Exams on Admission: Ct Abdomen Pelvis W Contrast  06/06/2014   CLINICAL DATA:  Left lower abdominal pain. Nausea and vomiting. 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  EXAM: CT ABDOMEN AND PELVIS WITH CONTRAST  TECHNIQUE: Multidetector CT imaging of the  abdomen and pelvis was performed using the standard protocol following bolus administration of intravenous contrast.  CONTRAST:  118mL OMNIPAQUE IOHEXOL 300 MG/ML  SOLN  COMPARISON:  CT abdomen pelvis - 04/24/2014; 09/18/2013  FINDINGS: Stable sequela of prior Whipple procedure. The gastrojejunostomy appears widely patent with passage of contrast into the proximal small bowel. No evidence of enteric obstruction. No pneumoperitoneum, pneumatosis or portal venous gas.  There is marked stranding about the full extent of the residual pancreas (coronal images 33, 44, 51, 54 and 73, series 4). There is preserved enhancement of the pancreatic parenchyma. These adjacent splenic artery and vein both appear widely patent without discrete area of vessel irregularity or contrast extravasation. No discrete peripherally enhancing peripancreatic fluid collection. No discrete pancreatic mass on this non pancreatic protocol CT.  Normal hepatic contour. There is an ill-defined subcentimeter (approximately 0.7 cm) hypo attenuating lesion within the subcapsular aspect of the lateral segment of the left lobe of the liver (image 30, series 2). There is a minimal focal fatty infiltration adjacent to the fissure for ligamentum teres. Post cholecystectomy. A stent is seen within in the common bile duct. No intra or extrahepatic biliary duct dilatation. The hepaticojejunostomy appears widely patent. No ascites.  There is symmetric enhancement and excretion of the bilateral kidneys. Subcentimeter (approximately 0.9 cm) hypo attenuating lesion within the mid aspect of the left kidney (image 32, series 2) is too small to accurately characterize of favored to represent a renal cyst. Additional bilateral subcentimeter hypoattenuating renal lesions are too small likely characterize also favored to represent additional renal cysts. No urinary obstruction or perinephric stranding.  Normal appearance of the bilateral adrenal glands and spleen.  1  scattered atherosclerotic plaque within a normal caliber abdominal aorta. The major branch vessels of the abdominal aorta appear widely patent on this non CTA examination. No retroperitoneal, mesenteric, pelvic or inguinal lymphadenopathy.  Normal appearance of the pelvic organs. Normal appearance of the urinary bladder given degree distention. No free fluid within the pelvic cul-de-sac.  Limited visualization of lower thorax is negative for focal airspace opacity or pleural effusion. Normal heart size. No pericardial effusion.  No acute or aggressive osseous abnormalities. Mild to moderate multilevel lumbar spine DDD, worse at L4-L5 and L5-S1 with disc space height loss, endplate irregularity and sclerosis.  IMPRESSION: 1. Findings compatible with acute uncomplicated pancreatitis. No definable/drainable peripancreatic fluid collection. 2. Post Whipple procedure without evidence of enteric obstruction. 3. Post cholecystectomy. A stent with seen within the common bile duct. No intra or extrahepatic today duct dilatation.   Electronically Signed   By: Sandi Mariscal M.D.   On: 06/06/2014 19:29    Assessment/Plan  48 yo male with acute pancreatitis s/p whipple procedure 5 months ago due to benign mass  Principal Problem:   Pancreatitis-  Conservative management.  Ivf.  Npo x ice chips.  abd exam is benign.  Reck lipase in am.  Active Problems:  Stable unless o/w noted   Nausea & vomiting   Abdominal pain   Chronic pancreatitis with mass  Admit to med surg.  Full code.  DAVID,RACHAL A 06/06/2014, 9:14 PM

## 2014-06-06 NOTE — ED Notes (Signed)
Pt able to drink fluids via Md

## 2014-06-06 NOTE — ED Provider Notes (Signed)
1700 - Care from Dr. Ashok Cordia. 89M s/p Whipple with L sided abdominal pain. CT pending. CT shows marked pancreatitis. Has had some vomiting, persistent uncontrollable pain. Admitted.   1. Acute pancreatitis, unspecified pancreatitis type   2. Left upper quadrant pain      Evelina Bucy, MD 06/06/14 2016

## 2014-06-06 NOTE — ED Notes (Signed)
abd pain started at 8 am states had a tumor removed in march has had some vomiting also

## 2014-06-07 DIAGNOSIS — R112 Nausea with vomiting, unspecified: Secondary | ICD-10-CM

## 2014-06-07 DIAGNOSIS — R1013 Epigastric pain: Secondary | ICD-10-CM

## 2014-06-07 LAB — COMPREHENSIVE METABOLIC PANEL
ALT: 47 U/L (ref 0–53)
AST: 206 U/L — ABNORMAL HIGH (ref 0–37)
Albumin: 3.4 g/dL — ABNORMAL LOW (ref 3.5–5.2)
Alkaline Phosphatase: 102 U/L (ref 39–117)
Anion gap: 14 (ref 5–15)
BUN: 12 mg/dL (ref 6–23)
CO2: 24 mEq/L (ref 19–32)
Calcium: 8.7 mg/dL (ref 8.4–10.5)
Chloride: 99 mEq/L (ref 96–112)
Creatinine, Ser: 0.64 mg/dL (ref 0.50–1.35)
GFR calc Af Amer: >90 mL/min (ref >90)
GFR calc non Af Amer: >90 mL/min (ref >90)
Glucose, Bld: 94 mg/dL (ref 70–99)
Potassium: 3.4 mEq/L — ABNORMAL LOW (ref 3.7–5.3)
Sodium: 137 mEq/L (ref 137–147)
Total Bilirubin: 1.2 mg/dL (ref 0.3–1.2)
Total Protein: 6 g/dL (ref 6.0–8.3)

## 2014-06-07 LAB — CBC
HCT: 42.6 % (ref 39.0–52.0)
Hemoglobin: 14.1 g/dL (ref 13.0–17.0)
MCH: 29.4 pg (ref 26.0–34.0)
MCHC: 33.1 g/dL (ref 30.0–36.0)
MCV: 88.9 fL (ref 78.0–100.0)
Platelets: 200 10*3/uL (ref 150–400)
RBC: 4.79 MIL/uL (ref 4.22–5.81)
RDW: 15.2 % (ref 11.5–15.5)
WBC: 9.3 10*3/uL (ref 4.0–10.5)

## 2014-06-07 LAB — LIPASE, BLOOD: Lipase: 512 U/L — ABNORMAL HIGH (ref 11–59)

## 2014-06-07 MED ORDER — OXYCODONE HCL 5 MG PO TABS
5.0000 mg | ORAL_TABLET | Freq: Four times a day (QID) | ORAL | Status: DC | PRN
  Administered 2014-06-07 – 2014-06-08 (×2): 5 mg via ORAL
  Filled 2014-06-07 (×2): qty 1

## 2014-06-07 MED ORDER — MUPIROCIN 2 % EX OINT: 1.0000 "application " | TOPICAL_OINTMENT | Freq: Two times a day (BID) | CUTANEOUS | Status: DC

## 2014-06-07 MED ORDER — KETOROLAC TROMETHAMINE 30 MG/ML IJ SOLN
30.0000 mg | Freq: Once | INTRAMUSCULAR | Status: AC
Start: 2014-06-07 — End: 2014-06-07
  Administered 2014-06-07: 30 mg via INTRAVENOUS
  Filled 2014-06-07: qty 1

## 2014-06-07 MED ORDER — CHLORHEXIDINE GLUCONATE CLOTH 2 % EX PADS: 6.0000 | MEDICATED_PAD | Freq: Every day | CUTANEOUS | Status: DC

## 2014-06-07 MED ORDER — HYDROMORPHONE HCL PF 1 MG/ML IJ SOLN
2.0000 mg | INTRAMUSCULAR | Status: DC | PRN
  Administered 2014-06-07 – 2014-06-09 (×13): 2 mg via INTRAVENOUS
  Filled 2014-06-07 (×13): qty 2

## 2014-06-07 MED ORDER — SODIUM CHLORIDE 0.9 % IV SOLN
INTRAVENOUS | Status: DC
  Administered 2014-06-07 – 2014-06-09 (×6): via INTRAVENOUS

## 2014-06-07 MED ORDER — SODIUM CHLORIDE 0.9 % IV SOLN: INTRAVENOUS | Status: DC

## 2014-06-07 MED ORDER — HYDROMORPHONE HCL PF 1 MG/ML IJ SOLN
1.0000 mg | INTRAMUSCULAR | Status: DC | PRN
  Administered 2014-06-07 (×2): 1 mg via INTRAVENOUS
  Filled 2014-06-07 (×2): qty 1

## 2014-06-07 NOTE — Progress Notes (Signed)
INITIAL NUTRITION ASSESSMENT  DOCUMENTATION CODES Per approved criteria  -Severe malnutrition in the context of chronic illness  Pt meets criteria for severe MALNUTRITION in the context of chronic illness as evidenced by 12% weight loss x 6 months, moderate subcutaneous fat and severe muscle depletion.  INTERVENTION:  If diet is advanced, would benefit from nutritional supplement  If expected to remain NPO for >7 days, recommend nutrition support.  Will continue to monitor  NUTRITION DIAGNOSIS: Altered GI function  related to pancreatitis as evidenced by need for current NPO status.   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 Ct shows acute pancreatitis. He has not had any issues since his whipple procedure except continued weight loss.  Pt reports 30 lb weight loss since March, per weight history documentation pt has lost about 19 lbs (12% wt loss x 6 months), he is still having some abdominal pain and a headache.   Pt currently NPO. Pt reports not eating anything for 3 days. PTA pt states that after his surgery in March, his appetite took 2 months to come back d/t pain medication use, at that time he was only able to eat maybe a sandwich the whole day, never a big meal. Recently his appetite has been better and he reports eating a lot of "fatty foods" for labor day weekend. Pt stated that his concern at the moment isn't eating, but getting something to drink.  Nutrition Focused Physical Exam:  Subcutaneous Fat:  Orbital Region: WNL Upper Arm Region: moderate depletion Thoracic and Lumbar Region: NA  Muscle:  Temple Region: WNL Clavicle  Bone Region: severe depletion Clavicle and Acromion Bone Region: severe depletion Scapular Bone Region: severe depletion Dorsal Hand: WNL Patellar Region: severe depletion Anterior Thigh Region: moderate depletion Posterior Calf Region: severe depletion  Edema: no LE edema    Labs reviewed: Low K  Height: Ht Readings from Last 1 Encounters:  06/06/14 6\' 3"  (1.905 m)    Weight: Wt Readings from Last 1 Encounters:  06/06/14 144 lb 4.8 oz (65.454 kg)    Ideal Body Weight: 196 lb  % Ideal Body Weight: 73%  Wt Readings from Last 10 Encounters:  06/06/14 144 lb 4.8 oz (65.454 kg)  04/24/14 144 lb (65.318 kg)  02/03/14 147 lb 9.6 oz (66.951 kg)  12/23/13 161 lb (73.029 kg)  12/04/13 163 lb 5.8 oz (74.1 kg)  12/04/13 163 lb 5.8 oz (74.1 kg)  11/28/13 164 lb 6 oz (74.56 kg)  11/07/13 166 lb (75.297 kg)  11/02/13 159 lb 3.2 oz (72.213 kg)  09/20/13 158 lb 9.6 oz (71.94 kg)    Usual Body Weight: 180 lb per pt  % Usual Body Weight: 80%  BMI:  Body mass index is 18.04 kg/(m^2).  Estimated Nutritional Needs: Kcal: 0488-8916 Protein: 90-100g Fluid: 2.3L/day  Skin: surgical scar on abdomen  Diet Order: NPO  EDUCATION NEEDS: -No education needs identified at this time   Intake/Output Summary (Last 24 hours) at 06/07/14 1136 Last data filed at 06/07/14 0646  Gross per 24 hour  Intake 743.75 ml  Output    450 ml  Net 293.75 ml  Last BM: 9/8  Labs:   Recent Labs Lab 06/06/14 1222 06/07/14 0515  NA 138 137  K 4.2 3.4*  CL 95* 99  CO2 23 24  BUN 11 12  CREATININE 0.74 0.64  CALCIUM 9.7 8.7  GLUCOSE 164* 94    CBG (last 3)  No results found for this basename: GLUCAP,  in the last 72 hours  Scheduled Meds: . enoxaparin (LOVENOX) injection  40 mg Subcutaneous QHS    Continuous Infusions:   Past Medical History  Diagnosis Date  . Scoliosis   . Pancreatic abnormality     CT  shows mass    Past Surgical History  Procedure Laterality Date   . Lumbar disc surgery  1990's  . Finger fracture surgery Left 1995    5th digit  . Eus N/A 09/21/2013    Procedure: ESOPHAGEAL ENDOSCOPIC ULTRASOUND (EUS) RADIAL;  Surgeon: Beryle Beams, MD;  Location: [facility];  Service: Endoscopy;  Laterality: N/A;  . Fine needle aspiration N/A 09/21/2013    Procedure: FINE NEEDLE ASPIRATION (FNA) LINEAR;  Surgeon: Beryle Beams, MD;  Location: [facility];  Service: Endoscopy;  Laterality: N/A;  . Back surgery  yrs ago    lower  . Eus N/A 09/30/2013    Procedure: UPPER ENDOSCOPIC ULTRASOUND (EUS) LINEAR;  Surgeon: Beryle Beams, MD;  Location: [facility];  Service: Endoscopy;  Laterality: N/A;  . Laparoscopy N/A 12/01/2013    Procedure: LAPAROSCOPY DIAGNOSTIC PANCREATICODUODENECTOMY WITH BILIARY AND PANCREATIC STENTS;  Surgeon: Stark Klein, MD;  Location: [facility];  Service: General;  Laterality: N/A;  . Whipple procedure      Clayton Bibles, MS, [place] Licensed Dietitian Nutritionist Pager: [phone]

## 2014-06-07 NOTE — Progress Notes (Signed)
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, path c/w groove pancreatitis, CT abdomen showed acute on copy to pancreatitis with no definable/drainable peripancreatic fluid collection, no evidence of enteric obstruction postcholecystectomy, stent in CBD - Will continue n.p.o., IV fluids, pain control today - Recheck CMET, lipase in AM  Active Problems:   Nausea & vomiting - Currently stable, continue antiemetics  History of weight loss: Per patient ever since surgery - Will obtain nutrition consult once pancreatitis is resolved, may benefit from nutritional supplements  DVT Prophylaxis: Lovenox  Code Status: Full code  Family Communication:  Disposition: Not medically ready  Consultants:  None  Procedures:  None  Antibiotics:  None    Subjective: States pain is not controlled, start hurting again within an hour, nausea vomiting  Objective: Weight change:   Intake/Output Summary (Last 24 hours) at 06/07/14 1100 Last data filed at 06/07/14 0646  Gross per 24 hour  Intake 743.75 ml  Output    450 ml  Net 293.75 ml   Blood pressure 133/91, pulse 85, temperature 98.2 F (36.8 C), temperature source Oral, resp. rate 16, height 6\' 3"  (1.905 m), weight 65.454 kg (144 lb 4.8 oz), SpO2 98.00%.  Physical Exam: General: Alert and awake, oriented x3, not in any acute distress. CVS: S1-S2 clear, no murmur rubs or gallops Chest: clear to auscultation bilaterally, no wheezing, rales or rhonchi Abdomen: soft, tender in the upper abdomen, nondistended, normal bowel sounds  Extremities: no cyanosis, clubbing or edema noted bilaterally Neuro: Cranial nerves II-XII intact, no focal neurological deficits  Lab Results: Basic Metabolic  Panel:  Recent Labs Lab 06/06/14 1222 06/07/14 0515  NA 138 137  K 4.2 3.4*  CL 95* 99  CO2 23 24  GLUCOSE 164* 94  BUN 11 12  CREATININE 0.74 0.64  CALCIUM 9.7 8.7   Liver Function Tests:  Recent Labs Lab 06/06/14 1222 06/07/14 0515  AST 44* 206*  ALT 29 47  ALKPHOS 111 102  BILITOT 1.2 1.2  PROT 7.6 6.0  ALBUMIN 4.3 3.4*    Recent Labs Lab 06/06/14 1222 06/07/14 0515  LIPASE 233* 512*   No results found for this basename: AMMONIA,  in the last 168 hours CBC:  Recent Labs Lab 06/06/14 1222 06/07/14 0515  WBC 11.2* 9.3  NEUTROABS 9.3*  --   HGB 16.2 14.1  HCT 47.1 42.6  MCV 88.2 88.9  PLT 259 200   Cardiac Enzymes: No results found for this basename: CKTOTAL, CKMB, CKMBINDEX, TROPONINI,  in the last 168 hours BNP: No components found with this basename: POCBNP,  CBG: No results found for this basename: GLUCAP,  in the last 168 hours   Micro Results: No results found for this or any previous visit (from the past 240 hour(s)).  Studies/Results: Ct Abdomen Pelvis W Contrast  06/06/2014   CLINICAL DATA:  Left lower abdominal pain. Nausea and vomiting. 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  EXAM: CT ABDOMEN AND PELVIS WITH CONTRAST  TECHNIQUE: Multidetector CT imaging of the abdomen and pelvis was performed using the standard protocol following bolus administration of intravenous contrast.  CONTRAST:  OMNIPAQUE IOHEXOL 300 MG/ML  SOLN  COMPARISON:  CT abdomen pelvis - 04/24/2014; 09/18/2013  FINDINGS: Stable sequela of prior Whipple procedure. The gastrojejunostomy appears widely patent with passage of contrast into the proximal small bowel. No evidence of enteric obstruction. No pneumoperitoneum, pneumatosis or portal venous gas.  There is marked stranding about the full extent of the residual pancreas (coronal images 33, 44, 51,  54 and 73, series 4). There is preserved enhancement of the pancreatic parenchyma. These adjacent splenic artery and vein both appear widely patent without discrete area of vessel irregularity or contrast extravasation. No discrete peripherally enhancing peripancreatic fluid collection. No discrete pancreatic mass on this non pancreatic protocol CT.  Normal hepatic contour. There is an ill-defined subcentimeter (approximately 0.7 cm) hypo attenuating lesion within the subcapsular aspect of the lateral segment of the left lobe of the liver (image 30, series 2). There is a minimal focal fatty infiltration adjacent to the fissure for ligamentum teres. Post cholecystectomy. A stent is seen within in the common bile duct. No intra or extrahepatic biliary duct dilatation. The hepaticojejunostomy appears widely patent. No ascites.  There is symmetric enhancement and excretion of the bilateral kidneys. Subcentimeter (approximately 0.9 cm) hypo attenuating lesion within the mid aspect of the left kidney (image 32, series 2) is too small to accurately characterize of favored to represent a renal cyst. Additional bilateral subcentimeter hypoattenuating renal lesions are too small likely characterize also favored to represent additional renal cysts. No urinary obstruction or perinephric stranding.  Normal appearance of the bilateral adrenal glands and spleen.  1 scattered atherosclerotic plaque within a normal caliber abdominal aorta. The major branch vessels of the abdominal aorta appear widely patent on this non CTA examination. No retroperitoneal, mesenteric, pelvic or inguinal lymphadenopathy.  Normal appearance of the pelvic organs. Normal appearance of the urinary bladder given degree distention. No free fluid within the pelvic cul-de-sac.  Limited visualization of lower thorax is negative for focal airspace opacity or pleural effusion. Normal heart size. No pericardial effusion.  No acute or aggressive osseous  abnormalities. Mild to moderate multilevel lumbar spine DDD, worse at L4-L5 and L5-S1 with disc space height loss, endplate irregularity and sclerosis.  IMPRESSION: 1. Findings compatible with acute uncomplicated pancreatitis. No definable/drainable peripancreatic fluid collection. 2. Post Whipple procedure without evidence of enteric obstruction. 3. Post cholecystectomy. A stent with seen within the common bile duct. No intra or extrahepatic today duct dilatation.   Electronically Signed   By: Simonne Come M.D.   On: 06/06/2014 19:29    Medications: Scheduled Meds: . Chlorhexidine Gluconate Cloth  6 each Topical Q0600  . enoxaparin (LOVENOX) injection  40 mg Subcutaneous QHS  . mupirocin ointment  1 application Nasal BID      LOS: 1 day   Laneya Gasaway M.D. [facility] 06/07/2014, 11:00 AM Pager: [phone]  If 7PM-7AM, please contact night-coverage [email or web address] Password [email or web address]  **Disclaimer: This note was dictated with voice recognition software. Similar sounding words can inadvertently be transcribed and this note may contain transcription errors which may not have been corrected upon publication of note.**

## 2014-06-08 DIAGNOSIS — E43 Unspecified severe protein-calorie malnutrition: Secondary | ICD-10-CM | POA: Diagnosis present

## 2014-06-08 LAB — COMPREHENSIVE METABOLIC PANEL
ALT: 89 U/L — ABNORMAL HIGH (ref 0–53)
AST: 131 U/L — ABNORMAL HIGH (ref 0–37)
Albumin: 3.3 g/dL — ABNORMAL LOW (ref 3.5–5.2)
Alkaline Phosphatase: 130 U/L — ABNORMAL HIGH (ref 39–117)
Anion gap: 15 (ref 5–15)
BUN: 5 mg/dL — ABNORMAL LOW (ref 6–23)
CO2: 22 mEq/L (ref 19–32)
Calcium: 8.4 mg/dL (ref 8.4–10.5)
Chloride: 95 mEq/L — ABNORMAL LOW (ref 96–112)
Creatinine, Ser: 0.59 mg/dL (ref 0.50–1.35)
GFR calc Af Amer: >90 mL/min (ref >90)
GFR calc non Af Amer: >90 mL/min (ref >90)
Glucose, Bld: 64 mg/dL — ABNORMAL LOW (ref 70–99)
Potassium: 3.9 mEq/L (ref 3.7–5.3)
Sodium: 132 mEq/L — ABNORMAL LOW (ref 137–147)
Total Bilirubin: 1.2 mg/dL (ref 0.3–1.2)
Total Protein: 5.8 g/dL — ABNORMAL LOW (ref 6.0–8.3)

## 2014-06-08 LAB — LIPASE, BLOOD: Lipase: 165 U/L — ABNORMAL HIGH (ref 11–59)

## 2014-06-08 NOTE — Progress Notes (Signed)
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, path c/w groove pancreatitis, CT abdomen showed acute on copy to pancreatitis with no definable/drainable peripancreatic fluid collection, no evidence of enteric obstruction postcholecystectomy, stent in CBD - d/w Dr Donell Beers (curbside) recommend to continue current management, patient remains at high risk for having recurrent pancreatitis, no surgical intervention needed   - Patient feels better today, no nausea, vomiting, pain improving, will try clear liquids, continue pain control - Patient also strongly recommended to abstain from alcohol, reported last drink 2 weeks ago and he drinks beer off and on  Active Problems:   Nausea & vomiting - Currently stable, continue antiemetics  Severe malnutrition with history of weight loss: Per patient ever since surgery - nutrition consult obtained, once pancreatitis is resolved, may benefit from nutritional supplements  DVT Prophylaxis: Lovenox  Code Status: Full code  Family Communication:  Disposition: Not medically ready, hopefully tomorrow  Consultants:  Discussed with Dr. Donell Beers, curbside  Procedures:  None  Antibiotics:  None    Subjective: States pain is better controlled, no nausea and vomiting   Objective: Weight change:   Intake/Output Summary (Last 24 hours) at 06/08/14 1046 Last data filed at 06/08/14 1042  Gross per 24 hour  Intake 3461.67 ml  Output   1375 ml  Net 2086.67 ml   Blood pressure 135/97, pulse 79, temperature 98.6 F (37 C), temperature source Oral, resp. rate 18, height 6\' 3"  (1.905 m), weight 65.454 kg (144 lb 4.8 oz), SpO2 100.00%.  Physical Exam: General: Alert and awake, oriented x3, NAD CVS: S1-S2 clear,  no murmur rubs or gallops Chest: clear to auscultation bilaterally, no wheezing, rales or rhonchi Abdomen: soft, minimally tender in the upper abdomen, nondistended, normal bowel sounds  Extremities: no cyanosis, clubbing or edema noted bilaterally   Lab Results: Basic Metabolic Panel:  Recent Labs Lab 06/07/14 0515 06/08/14 0500  NA 137 132*  K 3.4* 3.9  CL 99 95*  CO2 24 22  GLUCOSE 94 64*  BUN 12 5*  CREATININE 0.64 0.59  CALCIUM 8.7 8.4   Liver Function Tests:  Recent Labs Lab 06/07/14 0515 06/08/14 0500  AST 206* 131*  ALT 47 89*  ALKPHOS 102 130*  BILITOT 1.2 1.2  PROT 6.0 5.8*  ALBUMIN 3.4* 3.3*    Recent Labs Lab 06/07/14 0515 06/08/14 0500  LIPASE 512* 165*   No results found for this basename: AMMONIA,  in the last 168 hours CBC:  Recent Labs Lab 06/06/14 1222 06/07/14 0515  WBC 11.2* 9.3  NEUTROABS 9.3*  --   HGB 16.2 14.1  HCT 47.1 42.6  MCV 88.2 88.9  PLT 259 200   Cardiac Enzymes: No results found for this basename: CKTOTAL, CKMB, CKMBINDEX, TROPONINI,  in the last 168 hours BNP: No components found with this basename: POCBNP,  CBG: No results found for this basename: GLUCAP,  in the last 168 hours   Micro Results: No results found for this or any previous visit (from the past 240 hour(s)).  Studies/Results: Ct Abdomen Pelvis W Contrast  06/06/2014   CLINICAL DATA:  Left lower abdominal pain. Nausea and vomiting. 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  EXAM: CT ABDOMEN AND PELVIS WITH CONTRAST  TECHNIQUE: Multidetector CT imaging of the abdomen and pelvis was performed using the standard protocol following bolus administration of intravenous contrast.  CONTRAST:  OMNIPAQUE IOHEXOL 300 MG/ML  SOLN  COMPARISON:  CT abdomen pelvis - 04/24/2014; 09/18/2013  FINDINGS: Stable sequela of prior Whipple procedure.  The gastrojejunostomy appears widely patent with passage of contrast into the proximal small bowel. No evidence of enteric obstruction. No pneumoperitoneum, pneumatosis or portal venous gas.  There is marked stranding about the full extent of the residual pancreas (coronal images 33, 44, 51, 54 and 73, series 4). There is preserved enhancement of the pancreatic parenchyma. These adjacent splenic artery and vein both appear widely patent without discrete area of vessel irregularity or contrast extravasation. No discrete peripherally enhancing peripancreatic fluid collection. No discrete pancreatic mass on this non pancreatic protocol CT.  Normal hepatic contour. There is an ill-defined subcentimeter (approximately 0.7 cm) hypo attenuating lesion within the subcapsular aspect of the lateral segment of the left lobe of the liver (image 30, series 2). There is a minimal focal fatty infiltration adjacent to the fissure for ligamentum teres. Post cholecystectomy. A stent is seen within in the common bile duct. No intra or extrahepatic biliary duct dilatation. The hepaticojejunostomy appears widely patent. No ascites.  There is symmetric enhancement and excretion of the bilateral kidneys. Subcentimeter (approximately 0.9 cm) hypo attenuating lesion within the mid aspect of the left kidney (image 32, series 2) is too small to accurately characterize of favored to represent a renal cyst. Additional bilateral subcentimeter hypoattenuating renal lesions are too small likely characterize also favored to represent additional renal cysts. No urinary obstruction or perinephric stranding.  Normal appearance of the bilateral adrenal glands and spleen.  1 scattered atherosclerotic plaque within a normal caliber abdominal aorta. The major branch vessels of the abdominal aorta appear widely patent on this non CTA examination. No retroperitoneal, mesenteric, pelvic or inguinal lymphadenopathy.  Normal appearance of the pelvic organs.  Normal appearance of the urinary bladder given degree distention. No free fluid within the pelvic cul-de-sac.  Limited visualization of lower thorax is negative for focal airspace opacity or pleural effusion. Normal heart size. No pericardial effusion.  No acute or aggressive osseous abnormalities. Mild to moderate multilevel lumbar spine DDD, worse at L4-L5 and L5-S1 with disc space height loss, endplate irregularity and sclerosis.  IMPRESSION: 1. Findings compatible with acute uncomplicated pancreatitis. No definable/drainable peripancreatic fluid collection. 2. Post Whipple procedure without evidence of enteric obstruction. 3. Post cholecystectomy. A stent with seen within the common bile duct. No intra or extrahepatic today duct dilatation.   Electronically Signed   By: Simonne Come M.D.   On: 06/06/2014 19:29    Medications: Scheduled Meds: . enoxaparin (LOVENOX) injection  40 mg Subcutaneous QHS      LOS: 2 days   Luz Burcher M.D. [facility] 06/08/2014, 10:46 AM Pager: [phone]  If 7PM-7AM, please contact night-coverage [email or web address] Password [email or web address]  **Disclaimer: This note was dictated with voice recognition software. Similar sounding words can inadvertently be transcribed and this note may contain transcription errors which may not have been corrected upon publication of note.**

## 2014-06-09 DIAGNOSIS — K861 Other chronic pancreatitis: Secondary | ICD-10-CM

## 2014-06-09 LAB — LIPID PANEL
Cholesterol: 110 mg/dL (ref 0–200)
HDL: 61 mg/dL (ref >39)
LDL Cholesterol: 37 mg/dL (ref 0–99)
Total CHOL/HDL Ratio: 1.8 RATIO
Triglycerides: 58 mg/dL (ref <150)
VLDL: 12 mg/dL (ref 0–40)

## 2014-06-09 LAB — COMPREHENSIVE METABOLIC PANEL
ALT: 58 U/L — ABNORMAL HIGH (ref 0–53)
AST: 47 U/L — ABNORMAL HIGH (ref 0–37)
Albumin: 3.2 g/dL — ABNORMAL LOW (ref 3.5–5.2)
Alkaline Phosphatase: 120 U/L — ABNORMAL HIGH (ref 39–117)
Anion gap: 12 (ref 5–15)
BUN: <3 mg/dL — ABNORMAL LOW (ref 6–23)
CO2: 27 mEq/L (ref 19–32)
Calcium: 8.6 mg/dL (ref 8.4–10.5)
Chloride: 96 mEq/L (ref 96–112)
Creatinine, Ser: 0.6 mg/dL (ref 0.50–1.35)
GFR calc Af Amer: >90 mL/min (ref >90)
GFR calc non Af Amer: >90 mL/min (ref >90)
Glucose, Bld: 106 mg/dL — ABNORMAL HIGH (ref 70–99)
Potassium: 3.6 mEq/L — ABNORMAL LOW (ref 3.7–5.3)
Sodium: 135 mEq/L — ABNORMAL LOW (ref 137–147)
Total Bilirubin: 0.8 mg/dL (ref 0.3–1.2)
Total Protein: 6.2 g/dL (ref 6.0–8.3)

## 2014-06-09 LAB — CBC
HCT: 41.3 % (ref 39.0–52.0)
Hemoglobin: 13.6 g/dL (ref 13.0–17.0)
MCH: 29.9 pg (ref 26.0–34.0)
MCHC: 32.9 g/dL (ref 30.0–36.0)
MCV: 90.8 fL (ref 78.0–100.0)
Platelets: 135 10*3/uL — ABNORMAL LOW (ref 150–400)
RBC: 4.55 MIL/uL (ref 4.22–5.81)
RDW: 14.3 % (ref 11.5–15.5)
WBC: 4.9 10*3/uL (ref 4.0–10.5)

## 2014-06-09 LAB — LIPASE, BLOOD: Lipase: 62 U/L — ABNORMAL HIGH (ref 11–59)

## 2014-06-09 MED ORDER — OXYCODONE HCL 5 MG PO TABS: 5.0000 mg | ORAL_TABLET | Freq: Four times a day (QID) | ORAL | Status: DC | PRN

## 2014-06-09 MED ORDER — BOOST / RESOURCE BREEZE PO LIQD
1.0000 | Freq: Three times a day (TID) | ORAL | Status: DC
  Administered 2014-06-09: 1 via ORAL

## 2014-06-09 MED ORDER — PROMETHAZINE HCL 12.5 MG PO TABS: 12.5000 mg | ORAL_TABLET | Freq: Four times a day (QID) | ORAL | Status: DC | PRN

## 2014-06-09 NOTE — Progress Notes (Signed)
Note/chart reviewed. Agree with note.   Danielly Ackerley RD, LDN, CNSC [phone] Pager [phone] After Hours Pager   

## 2014-06-09 NOTE — Progress Notes (Signed)
Discussed discharge summary with patient. Reviewed all medications with patient. Patient received Rx. No questions asked by patient. Patient ready for discharge and waiting for ride.

## 2014-06-09 NOTE — Care Management Note (Signed)
  Page 1 of 1   06/09/2014     9:55:35 AM CARE MANAGEMENT NOTE 06/09/2014  Patient:  Brent Rogers, Brent Rogers   Account Number:  [id number]  Date Initiated:  06/09/2014  Documentation initiated by:  Magdalen Spatz  Subjective/Objective Assessment:     Action/Plan:   Anticipated DC Date:  06/09/2014   Anticipated DC Plan:  [place]  CM consult  [facility]      Choice offered to / List presented to:             Status of service:   Medicare Important Message given?   (If response is "NO", the following Medicare IM given date fields will be blank) Date Medicare IM given:   Medicare IM given by:   Date Additional Medicare IM given:   Additional Medicare IM given by:    Discharge Disposition:    Per UR Regulation:  Reviewed for med. necessity/level of care/duration of stay  If discussed at [place] of Stay Meetings, dates discussed:    Comments:   06-09-14 Phenergan tablets are on [facility] $4 list. Magdalen Spatz RN BSN

## 2014-06-09 NOTE — Discharge Instructions (Signed)
Call [facility] and [facility] [phone], for Primary Doctor follow up appointment .

## 2014-06-09 NOTE — Discharge Summary (Signed)
Physician Discharge Summary  Patient ID: Brent Rogers MRN: [id number] DOB/AGE: 48/05/1966 49 y.o.  Admit date: 06/06/2014 Discharge date: 06/09/2014  Primary Care Physician:  No PCP Per Patient  Discharge Diagnoses:    . Acute Pancreatitis . Nausea & vomiting . Chronic pancreatitis  . Abdominal pain . Severe malnutrition History of alcohol use  Consults: None    Recommendations for Outpatient Follow-up:  Patient was strongly recommended to abstain from alcohol  Allergies:  No Known Allergies   Discharge Medications:   Medication List         oxyCODONE 5 MG immediate release tablet  Commonly known as:  Oxy IR/ROXICODONE  Take 1 tablet (5 mg total) by mouth every 6 (six) hours as needed for severe pain.     Pancrelipase (Lip-Prot-Amyl) 24000 UNITS Cpep  Take 1 capsule by mouth daily. With a meal     promethazine 12.5 MG tablet  Commonly known as:  PHENERGAN  Take 1 tablet (12.5 mg total) by mouth every 6 (six) hours as needed for nausea or vomiting.         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 No fevers. No cough. No swelling. No chest pain or sob. Ct shows acute pancreatitis. He has not had any issues since his whipple procedure except continued weight loss.   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 I discussed with Dr Donell Beers (curbside) recommended to continue current management, patient remains at high risk for having recurrent pancreatitis, no surgical intervention  needed. Patient feels better with no nausea, vomiting and abdominal pain, tolerating soft solids.  Patient also strongly recommended to abstain from alcohol, reported last drink 2 weeks ago and he drinks beer off and on    Nausea & vomiting  - Currently stable, continue antiemetics   Severe malnutrition with history of weight loss: Per patient ever since surgery  - nutrition consult obtained, once pancreatitis is resolved, may benefit from nutritional supplements, he doesn't like them much.    Day of Discharge BP 153/96  Pulse 63  Temp(Src) 98.7 F (37.1 C) (Oral)  Resp 18  Ht 6\' 3"  (1.905 m)  Wt 65.454 kg (144 lb 4.8 oz)  BMI 18.04 kg/m2  SpO2 100%  Physical Exam: General: Alert and awake oriented x3 not in any acute distress. HEENT: anicteric sclera, pupils reactive to light and accommodation CVS: S1-S2 clear no murmur rubs or gallops Chest: clear to auscultation bilaterally, no wheezing rales or rhonchi Abdomen: soft nontender, nondistended, normal bowel sounds Extremities: no cyanosis, clubbing or edema noted bilaterally Neuro: Cranial nerves II-XII intact, no focal neurological deficits   The results of significant diagnostics from this hospitalization (including imaging, microbiology, ancillary and laboratory) are listed below for reference.    LAB RESULTS: Basic Metabolic Panel:  Recent Labs Lab 06/08/14 0500 06/09/14 0500  NA 132* 135*  K 3.9 3.6*  CL 95* 96  CO2 22 27  GLUCOSE 64* 106*  BUN 5* <3*  CREATININE 0.59 0.60  CALCIUM 8.4 8.6  Liver Function Tests:  Recent Labs Lab 06/08/14 0500 06/09/14 0500  AST 131* 47*  ALT 89* 58*  ALKPHOS 130* 120*  BILITOT 1.2 0.8  PROT 5.8* 6.2  ALBUMIN 3.3* 3.2*    Recent Labs Lab 06/08/14 0500 06/09/14 0500  LIPASE 165* 62*   No results found for this basename: AMMONIA,  in the last 168 hours CBC:  Recent Labs Lab 06/06/14 1222 06/07/14 0515 06/09/14 0500  WBC 11.2* 9.3 4.9  NEUTROABS 9.3*   --   --   HGB 16.2 14.1 13.6  HCT 47.1 42.6 41.3  MCV 88.2 88.9 90.8  PLT 259 200 135*   Cardiac Enzymes: No results found for this basename: CKTOTAL, CKMB, CKMBINDEX, TROPONINI,  in the last 168 hours BNP: No components found with this basename: POCBNP,  CBG: No results found for this basename: GLUCAP,  in the last 168 hours  Significant Diagnostic Studies:  Ct Abdomen Pelvis W Contrast  06/06/2014   CLINICAL DATA:  Left lower abdominal pain. Nausea and vomiting. 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  EXAM: CT ABDOMEN AND PELVIS WITH CONTRAST  TECHNIQUE: Multidetector CT imaging of the abdomen and pelvis was performed using the standard protocol following bolus administration of intravenous contrast.  CONTRAST:  OMNIPAQUE IOHEXOL 300 MG/ML  SOLN  COMPARISON:  CT abdomen pelvis - 04/24/2014; 09/18/2013  FINDINGS: Stable sequela of prior Whipple procedure. The gastrojejunostomy appears widely patent with passage of contrast into the proximal small bowel. No evidence of enteric obstruction. No pneumoperitoneum, pneumatosis or portal venous gas.  There is marked stranding about the full extent of the residual pancreas (coronal images 33, 44, 51, 54 and 73, series 4). There is preserved enhancement of the pancreatic parenchyma. These adjacent splenic artery and vein both appear widely patent without discrete area of vessel irregularity or contrast extravasation. No discrete peripherally enhancing peripancreatic fluid collection. No discrete pancreatic mass on this non pancreatic protocol CT.  Normal hepatic contour. There is an ill-defined subcentimeter (approximately 0.7 cm) hypo attenuating lesion within the subcapsular aspect of the lateral segment of the left lobe of the liver (image 30, series 2). There is a minimal focal fatty infiltration adjacent to the fissure for  ligamentum teres. Post cholecystectomy. A stent is seen within in the common bile duct. No intra or extrahepatic biliary duct dilatation. The hepaticojejunostomy appears widely patent. No ascites.  There is symmetric enhancement and excretion of the bilateral kidneys. Subcentimeter (approximately 0.9 cm) hypo attenuating lesion within the mid aspect of the left kidney (image 32, series 2) is too small to accurately characterize of favored to represent a renal cyst. Additional bilateral subcentimeter hypoattenuating renal lesions are too small likely characterize also favored to represent additional renal cysts. No urinary obstruction or perinephric stranding.  Normal appearance of the bilateral adrenal glands and spleen.  1 scattered atherosclerotic plaque within a normal caliber abdominal aorta. The major branch vessels of the abdominal aorta appear widely patent on this non CTA examination. No retroperitoneal, mesenteric, pelvic or inguinal lymphadenopathy.  Normal appearance of the pelvic organs. Normal appearance of the urinary bladder given degree distention. No free fluid within the pelvic cul-de-sac.  Limited visualization of lower thorax is negative for focal airspace opacity or pleural effusion. Normal heart size. No pericardial effusion.  No acute or aggressive osseous abnormalities. Mild to moderate multilevel lumbar spine DDD, worse at L4-L5 and L5-S1 with disc  space height loss, endplate irregularity and sclerosis.  IMPRESSION: 1. Findings compatible with acute uncomplicated pancreatitis. No definable/drainable peripancreatic fluid collection. 2. Post Whipple procedure without evidence of enteric obstruction. 3. Post cholecystectomy. A stent with seen within the common bile duct. No intra or extrahepatic today duct dilatation.   Electronically Signed   By: Simonne Come M.D.   On: 06/06/2014 19:29      Disposition and Follow-up:     Discharge Instructions   Diet - low sodium heart healthy     Complete by:  As directed      Increase activity slowly    Complete by:  As directed             DISPOSITION: home   DIET: soft solids     DISCHARGE FOLLOW-UP Follow-up Information   Follow up with [facility]    . Schedule an appointment as soon as possible for a visit in 2 weeks. (for hospital follow-up)    Contact information:   [address] [place] [address] [phone]      Time spent on Discharge: 35 mins Signed:   Lourine Alberico M.D. [facility] 06/09/2014, 11:21 AM Pager: [phone]   **Disclaimer: This note was dictated with voice recognition software. Similar sounding words can inadvertently be transcribed and this note may contain transcription errors which may not have been corrected upon publication of note.**

## 2014-08-14 ENCOUNTER — Emergency Department (HOSPITAL_COMMUNITY): Payer: Self-pay

## 2014-08-14 ENCOUNTER — Encounter (HOSPITAL_COMMUNITY): Payer: Self-pay | Admitting: *Deleted

## 2014-08-14 DIAGNOSIS — R1032 Left lower quadrant pain: Secondary | ICD-10-CM | POA: Insufficient documentation

## 2014-08-14 DIAGNOSIS — Z720 Tobacco use: Secondary | ICD-10-CM | POA: Insufficient documentation

## 2014-08-14 DIAGNOSIS — Z79899 Other long term (current) drug therapy: Secondary | ICD-10-CM | POA: Insufficient documentation

## 2014-08-14 DIAGNOSIS — Z8739 Personal history of other diseases of the musculoskeletal system and connective tissue: Secondary | ICD-10-CM | POA: Insufficient documentation

## 2014-08-14 DIAGNOSIS — Q453 Other congenital malformations of pancreas and pancreatic duct: Secondary | ICD-10-CM | POA: Insufficient documentation

## 2014-08-14 LAB — I-STAT TROPONIN, ED: Troponin i, poc: 0 ng/mL (ref 0.00–0.08)

## 2014-08-14 NOTE — ED Notes (Signed)
Pt reports abdominal and chest pain since yesterday. Pt states he had abdominal surgery in March. Pt denies n/v/d, constipation. No associated symptoms with the chest pain. Pt states he smoke a pack every two week.s

## 2014-08-15 ENCOUNTER — Emergency Department (HOSPITAL_COMMUNITY): Payer: Self-pay

## 2014-08-15 ENCOUNTER — Emergency Department (HOSPITAL_COMMUNITY)
Admission: EM | Admit: 2014-08-15 | Discharge: 2014-08-15 | Disposition: A | Discharge status: Home / Self Care | Payer: Self-pay | Attending: Emergency Medicine | Admitting: Emergency Medicine

## 2014-08-15 ENCOUNTER — Encounter (HOSPITAL_COMMUNITY): Payer: Self-pay

## 2014-08-15 DIAGNOSIS — R1032 Left lower quadrant pain: Secondary | ICD-10-CM

## 2014-08-15 DIAGNOSIS — R079 Chest pain, unspecified: Secondary | ICD-10-CM

## 2014-08-15 LAB — CBC WITH DIFFERENTIAL/PLATELET
Basophils Absolute: 0 10*3/uL (ref 0.0–0.1)
Basophils Relative: 0 % (ref 0–1)
Eosinophils Absolute: 0 10*3/uL (ref 0.0–0.7)
Eosinophils Relative: 0 % (ref 0–5)
HCT: 45.1 % (ref 39.0–52.0)
Hemoglobin: 15.2 g/dL (ref 13.0–17.0)
Lymphocytes Relative: 14 % (ref 12–46)
Lymphs Abs: 1.6 10*3/uL (ref 0.7–4.0)
MCH: 28.5 pg (ref 26.0–34.0)
MCHC: 33.7 g/dL (ref 30.0–36.0)
MCV: 84.5 fL (ref 78.0–100.0)
Monocytes Absolute: 1.1 10*3/uL — ABNORMAL HIGH (ref 0.1–1.0)
Monocytes Relative: 10 % (ref 3–12)
Neutro Abs: 8.6 10*3/uL — ABNORMAL HIGH (ref 1.7–7.7)
Neutrophils Relative %: 76 % (ref 43–77)
Platelets: 238 10*3/uL (ref 150–400)
RBC: 5.34 MIL/uL (ref 4.22–5.81)
RDW: 13.9 % (ref 11.5–15.5)
WBC: 11.4 10*3/uL — ABNORMAL HIGH (ref 4.0–10.5)

## 2014-08-15 LAB — URINALYSIS, ROUTINE W REFLEX MICROSCOPIC
Glucose, UA: NEGATIVE mg/dL
Hgb urine dipstick: NEGATIVE
Ketones, ur: 15 mg/dL — AB
Leukocytes, UA: NEGATIVE
Nitrite: NEGATIVE
Protein, ur: 30 mg/dL — AB
Specific Gravity, Urine: 1.022 (ref 1.005–1.030)
Urobilinogen, UA: 1 mg/dL (ref 0.0–1.0)
pH: 6 (ref 5.0–8.0)

## 2014-08-15 LAB — COMPREHENSIVE METABOLIC PANEL
ALT: 28 U/L (ref 0–53)
AST: 38 U/L — ABNORMAL HIGH (ref 0–37)
Albumin: 4.8 g/dL (ref 3.5–5.2)
Alkaline Phosphatase: 190 U/L — ABNORMAL HIGH (ref 39–117)
Anion gap: 16 — ABNORMAL HIGH (ref 5–15)
BUN: 6 mg/dL (ref 6–23)
CO2: 26 mEq/L (ref 19–32)
Calcium: 11 mg/dL — ABNORMAL HIGH (ref 8.4–10.5)
Chloride: 95 mEq/L — ABNORMAL LOW (ref 96–112)
Creatinine, Ser: 0.78 mg/dL (ref 0.50–1.35)
GFR calc Af Amer: >90 mL/min (ref >90)
GFR calc non Af Amer: >90 mL/min (ref >90)
Glucose, Bld: 142 mg/dL — ABNORMAL HIGH (ref 70–99)
Potassium: 5.5 mEq/L — ABNORMAL HIGH (ref 3.7–5.3)
Sodium: 137 mEq/L (ref 137–147)
Total Bilirubin: 1 mg/dL (ref 0.3–1.2)
Total Protein: 9.4 g/dL — ABNORMAL HIGH (ref 6.0–8.3)

## 2014-08-15 LAB — URINE MICROSCOPIC-ADD ON

## 2014-08-15 LAB — POTASSIUM: Potassium: 4.2 mEq/L (ref 3.7–5.3)

## 2014-08-15 LAB — LIPASE, BLOOD: Lipase: 9 U/L — ABNORMAL LOW (ref 11–59)

## 2014-08-15 MED ORDER — ONDANSETRON 8 MG PO TBDP: 8.0000 mg | ORAL_TABLET | Freq: Three times a day (TID) | ORAL | Status: DC | PRN

## 2014-08-15 MED ORDER — MORPHINE SULFATE 4 MG/ML IJ SOLN: 4.0000 mg | Freq: Once | INTRAMUSCULAR | Status: DC

## 2014-08-15 MED ORDER — SODIUM CHLORIDE 0.9 % IV SOLN
1000.0000 mL | Freq: Once | INTRAVENOUS | Status: AC
  Administered 2014-08-15: 1000 mL via INTRAVENOUS

## 2014-08-15 MED ORDER — DICYCLOMINE HCL 20 MG PO TABS: 20.0000 mg | ORAL_TABLET | Freq: Four times a day (QID) | ORAL | Status: DC | PRN

## 2014-08-15 MED ORDER — SODIUM CHLORIDE 0.9 % IV SOLN
1000.0000 mL | INTRAVENOUS | Status: DC
  Administered 2014-08-15: 1000 mL via INTRAVENOUS

## 2014-08-15 MED ORDER — OXYCODONE HCL 5 MG PO TABS
10.0000 mg | ORAL_TABLET | Freq: Once | ORAL | Status: AC
  Administered 2014-08-15: 10 mg via ORAL
  Filled 2014-08-15: qty 2

## 2014-08-15 MED ORDER — ONDANSETRON HCL 4 MG/2ML IJ SOLN
4.0000 mg | Freq: Once | INTRAMUSCULAR | Status: AC
  Administered 2014-08-15: 4 mg via INTRAVENOUS
  Filled 2014-08-15: qty 2

## 2014-08-15 MED ORDER — IOHEXOL 300 MG/ML  SOLN
100.0000 mL | Freq: Once | INTRAMUSCULAR | Status: AC | PRN
  Administered 2014-08-15: 100 mL via INTRAVENOUS

## 2014-08-15 MED ORDER — DICYCLOMINE HCL 10 MG PO CAPS
20.0000 mg | ORAL_CAPSULE | Freq: Once | ORAL | Status: AC
  Administered 2014-08-15: 20 mg via ORAL
  Filled 2014-08-15: qty 2

## 2014-08-15 MED ORDER — MORPHINE SULFATE 4 MG/ML IJ SOLN
4.0000 mg | Freq: Once | INTRAMUSCULAR | Status: AC
  Administered 2014-08-15: 4 mg via INTRAVENOUS
  Filled 2014-08-15: qty 1

## 2014-08-15 MED ORDER — OXYCODONE HCL 5 MG PO TABS: 5.0000 mg | ORAL_TABLET | Freq: Four times a day (QID) | ORAL | Status: DC | PRN

## 2014-08-15 NOTE — ED Provider Notes (Signed)
CSN: [id number]     Arrival date & time 08/14/14  2306 History   First MD Initiated Contact with Patient 08/15/14 0239     Chief Complaint  Patient presents with  . Abdominal Pain  . Chest Pain     (Consider location/radiation/quality/duration/timing/severity/associated sxs/prior Treatment) HPI 48 year old male presents to the emergency department from home with complaint of left lower quadrant abdominal pain radiation up his spine and into his chest.  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  He denies any fever or chills.  He has had nausea.  He reports normal bowel movements.  He denies history of kidney stones. Past Medical History  Diagnosis Date  . Scoliosis   . Pancreatic abnormality     CT  shows mass   Past Surgical History  Procedure Laterality Date  . Lumbar disc surgery  1990's  . Finger fracture surgery Left 1995    5th digit  . Eus N/A 09/21/2013    Procedure: ESOPHAGEAL ENDOSCOPIC ULTRASOUND (EUS) RADIAL;  Surgeon: Beryle Beams, MD;  Location: [facility];  Service: Endoscopy;  Laterality: N/A;  . Fine needle aspiration N/A 09/21/2013    Procedure: FINE NEEDLE ASPIRATION (FNA) LINEAR;  Surgeon: Beryle Beams, MD;  Location: [facility];  Service: Endoscopy;  Laterality: N/A;  . Back surgery  yrs ago    lower  . Eus N/A 09/30/2013    Procedure: UPPER ENDOSCOPIC ULTRASOUND (EUS) LINEAR;  Surgeon: Beryle Beams, MD;  Location: [facility];  Service: Endoscopy;  Laterality: N/A;  . Laparoscopy N/A 12/01/2013    Procedure: LAPAROSCOPY DIAGNOSTIC PANCREATICODUODENECTOMY WITH BILIARY AND PANCREATIC STENTS;  Surgeon: Stark Klein, MD;  Location: [facility];  Service: General;  Laterality: N/A;  . Whipple procedure     Family History  Problem Relation Age of Onset  . Cancer Mother     unsure   History  Substance Use Topics  . Smoking status: Current Every Day Smoker -- 0.25 packs/day for 10 years    Types: Cigarettes  . Smokeless tobacco: Former Systems developer     Types: Snuff     Comment: 09/21/2013 "smoke < 1 pack cigarettes q 2 wks"  . Alcohol Use: Yes     Comment: 09/21/2013 "drink a beer couple times/month"    Review of Systems   See History of Present Illness; otherwise all other systems are reviewed and negative  Allergies  Review of patient's allergies indicates no known allergies.  Home Medications   Prior to Admission medications   Medication Sig Start Date End Date Taking? Authorizing Provider  oxyCODONE (OXY IR/ROXICODONE) 5 MG immediate release tablet Take 1 tablet (5 mg total) by mouth every 6 (six) hours as needed for severe pain. 06/09/14  Yes Ripudeep Krystal Eaton, MD  Pancrelipase, Lip-Prot-Amyl, 24000 UNITS CPEP Take 1 capsule by mouth daily. With a meal   Yes Historical Provider, MD  promethazine (PHENERGAN) 12.5 MG tablet Take 1 tablet (12.5 mg total) by mouth every 6 (six) hours as needed for nausea or vomiting. 06/09/14  Yes Ripudeep K Rai, MD   BP 144/106 mmHg  Pulse 110  Temp(Src) 98 F (36.7 C) (Oral)  Resp 18  Ht 6\' 3"  (1.905 m)  Wt 137 lb 14.4 oz (62.551 kg)  BMI 17.24 kg/m2  SpO2 100% Physical Exam  Constitutional: He is oriented to person, place, and time. He appears well-developed and well-nourished.  HENT:  Head: Normocephalic and atraumatic.  Nose: Nose normal.  Mouth/Throat: Oropharynx is clear and moist.  Eyes:  Conjunctivae and EOM are normal. Pupils are equal, round, and reactive to light.  Neck: Normal range of motion. Neck supple. No JVD present. No tracheal deviation present. No thyromegaly present.  Cardiovascular: Normal rate, regular rhythm, normal heart sounds and intact distal pulses.  Exam reveals no gallop and no friction rub.   No murmur heard. Pulmonary/Chest: Effort normal and breath sounds normal. No stridor. No respiratory distress. He has no wheezes. He has no rales. He exhibits no tenderness.  Abdominal: Soft. Bowel sounds are normal. He exhibits no distension and no mass. There is tenderness  (patient has tenderness in left lower quadrant). There is no rebound and no guarding.  Musculoskeletal: Normal range of motion. He exhibits no edema or tenderness.  Lymphadenopathy:    He has no cervical adenopathy.  Neurological: He is alert and oriented to person, place, and time. He displays normal reflexes. He exhibits normal muscle tone. Coordination normal.  Skin: Skin is warm and dry. No rash noted. No erythema. No pallor.  Psychiatric: He has a normal mood and affect. His behavior is normal. Judgment and thought content normal.  Nursing note and vitals reviewed.   ED Course  Procedures (including critical care time) Labs Review Labs Reviewed  COMPREHENSIVE METABOLIC PANEL - Abnormal; Notable for the following:    Potassium 5.5 (*)    Chloride 95 (*)    Glucose, Bld 142 (*)    Calcium 11.0 (*)    Total Protein 9.4 (*)    AST 38 (*)    Alkaline Phosphatase 190 (*)    Anion gap 16 (*)    All other components within normal limits  LIPASE, BLOOD - Abnormal; Notable for the following:    Lipase 9 (*)    All other components within normal limits  URINALYSIS, ROUTINE W REFLEX MICROSCOPIC - Abnormal; Notable for the following:    Color, Urine AMBER (*)    APPearance CLOUDY (*)    Bilirubin Urine SMALL (*)    Ketones, ur 15 (*)    Protein, ur 30 (*)    All other components within normal limits  URINE MICROSCOPIC-ADD ON - Abnormal; Notable for the following:    Squamous Epithelial / LPF FEW (*)    Bacteria, UA FEW (*)    Casts GRANULAR CAST (*)    Crystals CA OXALATE CRYSTALS (*)    All other components within normal limits  CBC WITH DIFFERENTIAL - Abnormal; Notable for the following:    WBC 11.4 (*)    Neutro Abs 8.6 (*)    Monocytes Absolute 1.1 (*)    All other components within normal limits  POTASSIUM  CBC WITH DIFFERENTIAL  Randolm Idol, ED    Imaging Review Dg Chest 2 View  08/14/2014   CLINICAL DATA:  Initial evaluation for acute chest pain.  EXAM: CHEST   2 VIEW  COMPARISON:  Prior CT 11/10/2013  FINDINGS: The cardiac and mediastinal silhouettes are stable in size and contour, and remain within normal limits.  The lungs are normally inflated. No airspace consolidation, pleural effusion, or pulmonary edema is identified. There is no pneumothorax.  No acute osseous abnormality identified.  IMPRESSION: No active cardiopulmonary disease.   Electronically Signed   By: Jeannine Boga M.D.   On: 08/14/2014 23:54   Ct Abdomen Pelvis W Contrast  08/15/2014   CLINICAL DATA:  Initial evaluation for left lower pain. History of pancreatic mass, status post Whipple procedure.  EXAM: CT ABDOMEN AND PELVIS WITH CONTRAST  TECHNIQUE: Multidetector  CT imaging of the abdomen and pelvis was performed using the standard protocol following bolus administration of intravenous contrast.  CONTRAST:  113mL OMNIPAQUE IOHEXOL 300 MG/ML  SOLN  COMPARISON:  Prior CT from 06/06/2014.  FINDINGS: The visualized lung bases are clear. No pleural or pericardial effusion.  Subcentimeter hypodensity within the inferior left hepatic lobe is unchanged. Liver is otherwise unremarkable. Gallbladder is surgically absent. Scattered foci of pneumobilia likely related to prior sphincterotomy and Whipple procedure. No frank biliary dilatation. Common bile duct stent has been removed. Spleen and adrenal glands are within normal limits.  Pancreatic parenchyma is somewhat ill-defined with mild peripancreatic fat stranding. Findings are suggestive of possible persistent or rete car acute pancreatitis. Overall, these changes are improved relative to prior. These findings may in part reflect resolving changes from previously diagnosed acute pancreatitis on 06/06/2014. No evidence for pancreatic necrosis. No peripancreatic fluid collection.  Kidneys are equal in size with symmetric enhancement. Few small cysts noted within the left kidney. No nephrolithiasis, hydronephrosis, or focal enhancing renal mass. No  hydroureter bilaterally.  Stomach within normal limits. No evidence for bowel obstruction. No abnormal wall thickening, mucosal enhancement, or inflammatory fat stranding seen about the bowels.  Bladder and prostate within normal limits.  No free air or fluid. Normal intravascular enhancement seen throughout the abdomen and pelvis. No adenopathy.  No acute osseous abnormality. No worrisome lytic or blastic osseous lesions. Degenerative disc desiccation present at L5-S1.  IMPRESSION: 1. Mild inflammatory stranding within the peripancreatic fat, suggestive of possible persistent or recurrent acute pancreatitis. Alternatively, these may reflect slowly resolving changes from recently diagnosed pancreatitis as seen on 06/06/2014. Correlation with serum lipase recommended. No evidence for pancreatic necrosis or loculated peripancreatic fluid collection. 2. Status post Whipple procedure without evidence for obstruction. 3. No other acute intra-abdominal or pelvic process identified.   Electronically Signed   By: Jeannine Boga M.D.   On: 08/15/2014 04:58     EKG Interpretation   Date/Time:  Monday August 14 2014 23:09:26 EST Ventricular Rate:  114 PR Interval:  138 QRS Duration: 80 QT Interval:  324 QTC Calculation: 446 R Axis:   48 Text Interpretation:  Sinus tachycardia Right atrial enlargement Possible  Anterior infarct , age undetermined Abnormal ECG Since last tracing rate  faster Confirmed by Lacheryl Niesen  MD, Alivia Cimino ([id number]) on 08/15/2014 1:48:25 AM      MDM   Final diagnoses:  LLQ pain   48 year old male status post Whipple in March with left lower quadrant pain.  No specific source for his pain.  He has inflammatory stranding within the surrounding pancreas, the patient is nontender across the upper abdomen.  Will provide pain and nausea medicine and have him follow-up with his regular doctor.  Kalman Drape, MD 08/15/14 [phone]

## 2014-08-15 NOTE — ED Notes (Signed)
Patient transported to CT 

## 2014-08-15 NOTE — Discharge Instructions (Signed)
Your workup in the emergency department today does not show a specific cause for your symptoms.  There is no inflammation, masses, or infection in your lower abdomen.  Please follow-up with a local primary care doctor for further workup and treatment of your symptoms.  Take medications as prescribed.  Return to emerge program for worsening condition or new concerning symptoms.   Emergency Department Resource Guide 1) Find a Doctor and Pay Out of Pocket Although you won't have to find out who is covered by your insurance plan, it is a good idea to ask around and get recommendations. You will then need to call the office and see if the doctor you have chosen will accept you as a new patient and what types of options they offer for patients who are self-pay. Some doctors offer discounts or will set up payment plans for their patients who do not have insurance, but you will need to ask so you aren't surprised when you get to your appointment.  2) Contact Your Local Health Department Not all health departments have doctors that can see patients for sick visits, but many do, so it is worth a call to see if yours does. If you don't know where your local health department is, you can check in your phone book. The CDC also has a tool to help you locate your state's health department, and many state websites also have listings of all of their local health departments.  3) Find a [facility] If your illness is not likely to be very severe or complicated, you may want to try a walk in clinic. These are popping up all over the country in pharmacies, drugstores, and shopping centers. They're usually staffed by nurse practitioners or physician assistants that have been trained to treat common illnesses and complaints. They're usually fairly quick and inexpensive. However, if you have serious medical issues or chronic medical problems, these are probably not your best option.  No Primary Care Doctor: - Call Health  Connect at  [phone] - they can help you locate a primary care doctor that  accepts your insurance, provides certain services, etc. - Physician Referral Service- [phone]  Chronic Pain Problems: Organization         Address  Phone   Notes  [facility]  [phone] Patients need to be referred by their primary care doctor.   Medication Assistance: Organization         Address  Phone   Notes  [facility] [address] [place], [place] [address] [phone] --Must be a resident of [facility] -- Must have NO insurance coverage whatsoever (no Medicaid/ Medicare, etc.) -- The pt. MUST have a primary care doctor that directs their care regularly and follows them in the community   Med[facility]ist  [phone]   [employer]  [phone]    Agencies that provide inexpensive medical care: Organization         Address  Phone   Notes  [place]  [phone]   [facility]    [phone]   [facility] [place], [place] [address] [phone]   [place] [address], [place] [phone]   Planned Parenthood    [phone]   [facility]    [phone]   [facility] [place] [place], [employer] Phone:  [phone], Fax:  [phone] Hours of Operation:  9 am - 6 pm, M-F.  Also accepts Medicaid/Medicare and self-pay.  [facility] for [address] [place], [address], [place] Phone: [phone], Fax: [phone]. Hours of Operation:  8:30 am - 5:30 pm, M-F.  Also accepts Medicaid and self-pay.  [facility] [place] [address] [place] Phone: [phone]   [place], [place], [place] [phone], Ext. [phone] Mondays & Thursdays: 7-9 AM.  First 15 patients are seen on a first come, first serve basis.     [facility] Providers:  Organization         Address  Phone   Notes  [facility] [address], [place] [phone] Also accepts self-pay patients.  [facility] [address], [place]  [phone]   [place], [address], [place] [phone]   [facility] [address] [place] [phone]   Lucianne Lei [address], [place]   [phone] Only accepts [place] Access [place] patients after they have their name applied to their card.   Self-Pay (no insurance) in [facility]:  Organization         Address  Phone   Notes  Sickle Cell Patients, [facility] Internal [facility] [place] [phone]   [facility] [place] [phone]   [facility] [place]  [place], [place],  [phone]   [facility]/Dr. Osei-Bonsu  [address] [place] or [address], [place] [phone] Phone number for both [place] and [place] locations is the same.  [facility] [address] [place] [phone]   [facility] [address], [place] or [address] [phone]   [facility] [address] [place] [phone], phone; [phone], fax Sees patients 1st and 3rd Saturday of every month.  Must not qualify for public or private insurance (i.e. Medicaid, Medicare, [place] Health Choice, Veterans' Benefits)  Household income should be no more than 200% of the poverty level The clinic cannot treat you if you are pregnant or think you are pregnant  Sexually transmitted diseases are not treated at the clinic.    Dental Care: Organization         Address  Phone  Notes  [facility]  Department of [facility] [place] [phone] Accepts children up to age 28 who are enrolled in [place] or [place]; pregnant women with a Medicaid card; and children who have applied for Medicaid or  Health Choice, but were declined, whose parents can pay a reduced fee at time of service.  [facility] Department of [facility]  [address], [employer] [phone] Accepts children up to age 72 who are enrolled in [place] or [place]; pregnant women with a Medicaid card; and children who have applied for Medicaid or  Health Choice, but were declined, whose parents can pay a reduced fee at time of service.  [facility] Access PROGRAM  [place] [phone] Patients are seen by appointment only. Walk-ins  are not accepted. [place] will see patients 33 years of age and older. Monday - Tuesday (8am-5pm) Most Wednesdays (8:30-5pm) $30 per visit, cash only  [facility] Adult Dental Access PROGRAM  [address], [facility] [phone] Patients are seen by appointment only. Walk-ins are not accepted. [place] will see patients 61 years of age and older. One Wednesday Evening (Monthly: Volunteer Based).  $30 per visit, cash only  [place]  [phone] for adults; Children under age 35, call Graduate Pediatric Dentistry at [phone]. Children aged 30-14, please call [phone] to request a pediatric application.  Dental services are provided in all areas of dental care including fillings, crowns and bridges, complete and partial dentures, implants, gum treatment, root canals, and extractions. Preventive care is also provided. Treatment is provided to both adults and children. Patients are selected via a lottery and there is often a waiting list.   [facility] [address], [place]  [phone]  [email or web address].[email or web address]   [facility] [address] [place], [place] [phone], Ext. [phone] Second and Fourth Thursday of each month, opens at 6:30 AM; Clinic ends at 9 AM.  Patients are seen on a first-come first-served basis, and a limited number are seen during each clinic.   [facility]  [address] Hillard Danker [place], [place] [phone]   Eligibility Requirements You must have lived in [place], [place], or [place] counties for at least the last three months.   You cannot be eligible for state or federal sponsored [employer], including [employer], [place], or [employer].   You generally cannot be eligible for healthcare insurance through your employer.    How to apply: Eligibility screenings are held every Tuesday and Wednesday afternoon from 1:00 pm until 4:00 pm. You do not need an appointment for the interview!  [facility] [address] [place], [place]   [place]  [place] Department  [place]  [phone]    Behavioral Health Resources in the Community: Intensive Outpatient Programs Organization         Address  Phone  Notes  [place] Parole. [address] [place], [place] [phone]   [facility] Outpatient [address] [place], [place]   ADS: Alcohol & Drug Svcs [address] [place], [place]   [place] [address],  [place], [place] or [phone]   Substance Abuse Resources Organization         Address  Phone  Notes  Alcohol and Drug Services  [phone]   [place]  [phone]   [facility] [place]   Chinita Pester  [phone]   Residential & Outpatient Substance Abuse Program  [phone]   Psychological Services Organization          Address  Phone  Notes  [facility]  [place]  [phone]   [place] [address], [place] [phone] or [phone]    Mobile Crisis Teams Organization         Address  Phone  Notes  [facility], Mobile Crisis Care Unit  [phone]   [facility]ive Psychotherapeutic Services  [address] [place], [place]   [facility] [address] [place] [phone]  Self-Help/Support Groups Organization         Address  Phone             Notes  [facility]ental [facility] [place] - variety of support groups  [place] Call for more information  Narcotics Anonymous (NA), [facility] [address], [employer]   2 meetings at this location   Special educational needs teacher         Address  Phone  Notes  [facility] Residential Treatment [place],    [place]  [phone]   [facility]  [address] [place] [address], [place], [place]   [place], [place] [phone] Admissions: 8am-3pm M-F  Incentives Substance [place] [address],    [place], [place] [phone]   [facility] [address] [place], [place], [place]   [facility] [address]  [place], [place]   [facility] - Intensive Outpatient [address], [place], [place]   [facility] ([place].,  [place], [place] [phone] or [phone]   [facility] (RTS) [address], [place], [place] Accepts Medicaid  [facility] [place] [address],  [place] [phone] Substance Abuse/Addiction Treatment   [facility] Organization         Address  Phone  Notes  [facility]  [phone]   Domenic Schwab, PhD [address] [place], [place]   [phone] or [phone]   [place], [place] [phone]   [facility] [address], [place], [place] [phone] Insurance/Medicaid/sponsorship through [facility] and Families [address]                                    [place], [place] [phone] [place] [address], [place] [phone]    Dr. Adele Schilder  [phone]   [facility] Dept. 1) [address], [place] 2) [employer] 3)  [place] [address], [place] [phone]  [phone]   [place] [phone] or [phone] (After Hours)       Abdominal Pain Many things can cause abdominal pain. Usually, abdominal pain is not caused by a disease and will improve without treatment. It can often be observed and treated at home. Your health care provider will do a physical exam and possibly order blood tests and X-rays to help determine the seriousness of your pain. However, in many cases, more time must pass before a clear cause of the pain can be found. Before that point, your health care provider may not know if you need more testing or further treatment. HOME CARE INSTRUCTIONS  Monitor your abdominal pain for any changes. The following actions may help to alleviate any discomfort you are experiencing:  Only take over-the-counter or prescription medicines as directed by your health care provider.  Do not take laxatives unless directed to do so by your health care provider.  Try a clear liquid diet (broth, tea, or water) as directed by your health care provider. Slowly move to a bland diet as  tolerated. SEEK MEDICAL CARE IF:  You have unexplained abdominal pain.  You have abdominal pain associated with nausea or diarrhea.  You have pain when you urinate or  have a bowel movement.  You experience abdominal pain that wakes you in the night.  You have abdominal pain that is worsened or improved by eating food.  You have abdominal pain that is worsened with eating fatty foods.  You have a fever. SEEK IMMEDIATE MEDICAL CARE IF:   Your pain does not go away within 2 hours.  You keep throwing up (vomiting).  Your pain is felt only in portions of the abdomen, such as the right side or the left lower portion of the abdomen.  You pass bloody or black tarry stools. MAKE SURE YOU:  Understand these instructions.   Will watch your condition.   Will get help right away if you are not doing well or get worse.  Document Released: 06/25/2005 Document Revised: 09/20/2013 Document Reviewed: 05/25/2013 [facility] Patient Information 2015 [place], [place]. This information is not intended to replace advice given to you by your health care provider. Make sure you discuss any questions you have with your health care provider.

## 2014-09-29 ENCOUNTER — Encounter (HOSPITAL_COMMUNITY): Payer: Self-pay | Admitting: Emergency Medicine

## 2014-09-29 ENCOUNTER — Inpatient Hospital Stay (HOSPITAL_COMMUNITY)
Admission: EM | Admit: 2014-09-29 | Discharge: 2014-09-30 | DRG: 440 | Disposition: A | Discharge status: Home / Self Care | Payer: Self-pay | Attending: Internal Medicine | Admitting: Internal Medicine

## 2014-09-29 DIAGNOSIS — IMO0001 Reserved for inherently not codable concepts without codable children: Secondary | ICD-10-CM | POA: Diagnosis present

## 2014-09-29 DIAGNOSIS — R1110 Vomiting, unspecified: Secondary | ICD-10-CM

## 2014-09-29 DIAGNOSIS — Z809 Family history of malignant neoplasm, unspecified: Secondary | ICD-10-CM

## 2014-09-29 DIAGNOSIS — R030 Elevated blood-pressure reading, without diagnosis of hypertension: Secondary | ICD-10-CM

## 2014-09-29 DIAGNOSIS — R109 Unspecified abdominal pain: Secondary | ICD-10-CM | POA: Insufficient documentation

## 2014-09-29 DIAGNOSIS — F17210 Nicotine dependence, cigarettes, uncomplicated: Secondary | ICD-10-CM | POA: Diagnosis present

## 2014-09-29 DIAGNOSIS — Z90411 Acquired partial absence of pancreas: Secondary | ICD-10-CM | POA: Diagnosis present

## 2014-09-29 DIAGNOSIS — F1010 Alcohol abuse, uncomplicated: Secondary | ICD-10-CM | POA: Diagnosis present

## 2014-09-29 DIAGNOSIS — M419 Scoliosis, unspecified: Secondary | ICD-10-CM | POA: Diagnosis present

## 2014-09-29 DIAGNOSIS — K861 Other chronic pancreatitis: Principal | ICD-10-CM | POA: Diagnosis present

## 2014-09-29 DIAGNOSIS — R1084 Generalized abdominal pain: Secondary | ICD-10-CM

## 2014-09-29 LAB — CBC WITH DIFFERENTIAL/PLATELET
Basophils Absolute: 0 10*3/uL (ref 0.0–0.1)
Basophils Relative: 0 % (ref 0–1)
Eosinophils Absolute: 0 10*3/uL (ref 0.0–0.7)
Eosinophils Relative: 0 % (ref 0–5)
HCT: 41.9 % (ref 39.0–52.0)
Hemoglobin: 13.9 g/dL (ref 13.0–17.0)
Lymphocytes Relative: 11 % — ABNORMAL LOW (ref 12–46)
Lymphs Abs: 1.2 10*3/uL (ref 0.7–4.0)
MCH: 29 pg (ref 26.0–34.0)
MCHC: 33.2 g/dL (ref 30.0–36.0)
MCV: 87.3 fL (ref 78.0–100.0)
Monocytes Absolute: 0.5 10*3/uL (ref 0.1–1.0)
Monocytes Relative: 4 % (ref 3–12)
Neutro Abs: 8.9 10*3/uL — ABNORMAL HIGH (ref 1.7–7.7)
Neutrophils Relative %: 85 % — ABNORMAL HIGH (ref 43–77)
Platelets: 216 10*3/uL (ref 150–400)
RBC: 4.8 MIL/uL (ref 4.22–5.81)
RDW: 15.7 % — ABNORMAL HIGH (ref 11.5–15.5)
WBC: 10.5 10*3/uL (ref 4.0–10.5)

## 2014-09-29 LAB — COMPREHENSIVE METABOLIC PANEL
ALT: 28 U/L (ref 0–53)
AST: 26 U/L (ref 0–37)
Albumin: 4.3 g/dL (ref 3.5–5.2)
Alkaline Phosphatase: 109 U/L (ref 39–117)
Anion gap: 10 (ref 5–15)
BUN: 6 mg/dL (ref 6–23)
CO2: 24 mmol/L (ref 19–32)
Calcium: 9.2 mg/dL (ref 8.4–10.5)
Chloride: 106 mEq/L (ref 96–112)
Creatinine, Ser: 0.66 mg/dL (ref 0.50–1.35)
GFR calc Af Amer: >90 mL/min (ref >90)
GFR calc non Af Amer: >90 mL/min (ref >90)
Glucose, Bld: 122 mg/dL — ABNORMAL HIGH (ref 70–99)
Potassium: 4.1 mmol/L (ref 3.5–5.1)
Sodium: 140 mmol/L (ref 135–145)
Total Bilirubin: 0.4 mg/dL (ref 0.3–1.2)
Total Protein: 7 g/dL (ref 6.0–8.3)

## 2014-09-29 LAB — URINALYSIS, ROUTINE W REFLEX MICROSCOPIC
Glucose, UA: NEGATIVE mg/dL
Hgb urine dipstick: NEGATIVE
Ketones, ur: 15 mg/dL — AB
Leukocytes, UA: NEGATIVE
Nitrite: NEGATIVE
Protein, ur: NEGATIVE mg/dL
Specific Gravity, Urine: 1.028 (ref 1.005–1.030)
Urobilinogen, UA: 1 mg/dL (ref 0.0–1.0)
pH: 6 (ref 5.0–8.0)

## 2014-09-29 LAB — LIPASE, BLOOD: Lipase: 76 U/L — ABNORMAL HIGH (ref 11–59)

## 2014-09-29 MED ORDER — HYDROMORPHONE HCL 2 MG/ML IJ SOLN
2.0000 mg | Freq: Once | INTRAMUSCULAR | Status: AC
  Administered 2014-09-29: 2 mg via INTRAVENOUS
  Filled 2014-09-29: qty 1

## 2014-09-29 MED ORDER — ONDANSETRON HCL 4 MG/2ML IJ SOLN: 4.0000 mg | Freq: Four times a day (QID) | INTRAMUSCULAR | Status: DC | PRN

## 2014-09-29 MED ORDER — HYDROMORPHONE HCL 1 MG/ML IJ SOLN
1.0000 mg | INTRAMUSCULAR | Status: DC | PRN
  Administered 2014-09-30: 1 mg via INTRAVENOUS
  Filled 2014-09-29: qty 1

## 2014-09-29 MED ORDER — ENOXAPARIN SODIUM 40 MG/0.4ML ~~LOC~~ SOLN
40.0000 mg | Freq: Every day | SUBCUTANEOUS | Status: DC
  Administered 2014-09-29: 40 mg via SUBCUTANEOUS
  Filled 2014-09-29 (×2): qty 0.4

## 2014-09-29 MED ORDER — ONDANSETRON HCL 4 MG PO TABS
4.0000 mg | ORAL_TABLET | Freq: Four times a day (QID) | ORAL | Status: DC | PRN
Start: 2014-09-29 — End: 2014-09-30

## 2014-09-29 MED ORDER — PROMETHAZINE HCL 25 MG/ML IJ SOLN
25.0000 mg | Freq: Once | INTRAMUSCULAR | Status: AC
  Administered 2014-09-29: 25 mg via INTRAVENOUS
  Filled 2014-09-29: qty 1

## 2014-09-29 MED ORDER — SODIUM CHLORIDE 0.9 % IV SOLN
INTRAVENOUS | Status: DC
  Administered 2014-09-30: 01:00:00 via INTRAVENOUS

## 2014-09-29 MED ORDER — SODIUM CHLORIDE 0.9 % IV BOLUS (SEPSIS)
1000.0000 mL | Freq: Once | INTRAVENOUS | Status: AC
  Administered 2014-09-29: 1000 mL via INTRAVENOUS

## 2014-09-29 MED ORDER — ACETAMINOPHEN 325 MG PO TABS: 650.0000 mg | ORAL_TABLET | Freq: Four times a day (QID) | ORAL | Status: DC | PRN

## 2014-09-29 MED ORDER — ONDANSETRON HCL 4 MG/2ML IJ SOLN
4.0000 mg | Freq: Once | INTRAMUSCULAR | Status: AC
  Administered 2014-09-29: 4 mg via INTRAVENOUS
  Filled 2014-09-29: qty 2

## 2014-09-29 MED ORDER — HYDROMORPHONE HCL 1 MG/ML IJ SOLN
1.0000 mg | Freq: Once | INTRAMUSCULAR | Status: AC
  Administered 2014-09-29: 1 mg via INTRAVENOUS
  Filled 2014-09-29: qty 1

## 2014-09-29 MED ORDER — HYDRALAZINE HCL 20 MG/ML IJ SOLN: 10.0000 mg | INTRAMUSCULAR | Status: DC | PRN

## 2014-09-29 MED ORDER — ACETAMINOPHEN 650 MG RE SUPP: 650.0000 mg | Freq: Four times a day (QID) | RECTAL | Status: DC | PRN

## 2014-09-29 NOTE — ED Provider Notes (Signed)
CSN: [id number]     Arrival date & time 09/29/14  1400 History   First MD Initiated Contact with Patient 09/29/14 1701     Chief Complaint  Patient presents with  . Abdominal Pain     (Consider location/radiation/quality/duration/timing/severity/associated sxs/prior Treatment) HPI Comments: The patient is a 49 year old male with past history of chronic pancreatitis, status post Whipple with benign mass, presenting to the emergency department chief complaint of abdominal pain and vomiting since today. Patient reports upper abdominal discomfort described as sharp, cramping. Also reports mild left lower quadrant discomfort. He reports 20 episodes of nonbloody emesis. Denies urinary symptoms, constant, diarrhea last bowel movement today.  No known sick contacts. No urinary symptoms. No recent travel, denies fever. Patient reports drinking 2 beers last night.  Patient is a 49 y.o. male presenting with abdominal pain. The history is provided by the patient. No language interpreter was used.  Abdominal Pain Associated symptoms: nausea and vomiting   Associated symptoms: no chills, no constipation, no diarrhea, no dysuria, no fever and no hematuria     Past Medical History  Diagnosis Date  . Scoliosis   . Pancreatic abnormality     CT  shows mass   Past Surgical History  Procedure Laterality Date  . Lumbar disc surgery  1990's  . Finger fracture surgery Left 1995    5th digit  . Eus N/A 09/21/2013    Procedure: ESOPHAGEAL ENDOSCOPIC ULTRASOUND (EUS) RADIAL;  Surgeon: Beryle Beams, MD;  Location: [facility];  Service: Endoscopy;  Laterality: N/A;  . Fine needle aspiration N/A 09/21/2013    Procedure: FINE NEEDLE ASPIRATION (FNA) LINEAR;  Surgeon: Beryle Beams, MD;  Location: [facility];  Service: Endoscopy;  Laterality: N/A;  . Back surgery  yrs ago    lower  . Eus N/A 09/30/2013    Procedure: UPPER ENDOSCOPIC ULTRASOUND (EUS) LINEAR;  Surgeon: Beryle Beams, MD;  Location: [facility];  Service: Endoscopy;  Laterality: N/A;  . Laparoscopy N/A 12/01/2013    Procedure: LAPAROSCOPY DIAGNOSTIC PANCREATICODUODENECTOMY WITH BILIARY AND PANCREATIC STENTS;  Surgeon: Stark Klein, MD;  Location: [facility];  Service: General;  Laterality: N/A;  . Whipple procedure     Family History  Problem Relation Age of Onset  . Cancer Mother     unsure   History  Substance Use Topics  . Smoking status: Current Every Day Smoker -- 0.25 packs/day for 10 years    Types: Cigarettes  . Smokeless tobacco: Former Systems developer    Types: Snuff     Comment: 09/21/2013 "smoke < 1 pack cigarettes q 2 wks"  . Alcohol Use: Yes     Comment: 09/21/2013 "drink a beer couple times/month"    Review of Systems  Constitutional: Negative for fever and chills.  Gastrointestinal: Positive for nausea, vomiting and abdominal pain. Negative for diarrhea, constipation, blood in stool and anal bleeding.  Genitourinary: Negative for dysuria and hematuria.      Allergies  Review of patient's allergies indicates no known allergies.  Home Medications   Prior to Admission medications   Medication Sig Start Date End Date Taking? Authorizing Provider  acetaminophen (TYLENOL) 500 MG tablet Take 500 mg by mouth every 6 (six) hours as needed for moderate pain (stomach pain).   Yes Historical Provider, MD  Pancrelipase, Lip-Prot-Amyl, 24000 UNITS CPEP Take 1 capsule by mouth at bedtime. With a meal   Yes Historical Provider, MD  dicyclomine (BENTYL) 20 MG tablet Take 1 tablet (20 mg total) by mouth  every 6 (six) hours as needed for spasms (for abdominal cramping). 08/15/14   Kalman Drape, MD  ondansetron (ZOFRAN ODT) 8 MG disintegrating tablet Take 1 tablet (8 mg total) by mouth every 8 (eight) hours as needed for nausea or vomiting. 08/15/14   Kalman Drape, MD  oxyCODONE (OXY IR/ROXICODONE) 5 MG immediate release tablet Take 1 tablet (5 mg total) by mouth every 6 (six) hours as needed for severe pain. 08/15/14   Kalman Drape, MD  promethazine (PHENERGAN) 12.5 MG tablet Take 1 tablet (12.5 mg total) by mouth every 6 (six) hours as needed for nausea or vomiting. 06/09/14   Ripudeep K Rai, MD   BP 154/104 mmHg  Pulse 87  Temp(Src) 98.7 F (37.1 C) (Oral)  Resp 18  SpO2 100% Physical Exam  Constitutional: He is oriented to person, place, and time. He appears well-developed and well-nourished. No distress.  Cardiovascular: Normal rate and regular rhythm.   Pulmonary/Chest: Effort normal. He has no wheezes. He has no rales.  Abdominal: Soft. There is tenderness in the epigastric area and left lower quadrant. There is no rebound and no guarding.  Multiple well healed scars.   Neurological: He is alert and oriented to person, place, and time.  Skin: Skin is warm and dry. He is not diaphoretic.  Psychiatric: He has a normal mood and affect. His behavior is normal.  Nursing note and vitals reviewed.   ED Course  Procedures (including critical care time) Labs Review Labs Reviewed  CBC WITH DIFFERENTIAL - Abnormal; Notable for the following:    RDW 15.7 (*)    Neutrophils Relative % 85 (*)    Neutro Abs 8.9 (*)    Lymphocytes Relative 11 (*)    All other components within normal limits  COMPREHENSIVE METABOLIC PANEL - Abnormal; Notable for the following:    Glucose, Bld 122 (*)    All other components within normal limits  LIPASE, BLOOD - Abnormal; Notable for the following:    Lipase 76 (*)    All other components within normal limits  URINALYSIS, ROUTINE W REFLEX MICROSCOPIC - Abnormal; Notable for the following:    Color, Urine AMBER (*)    Bilirubin Urine SMALL (*)    Ketones, ur 15 (*)    All other components within normal limits  HEPATIC FUNCTION PANEL  BASIC METABOLIC PANEL    Imaging Review No results found.   EKG Interpretation None      MDM   Final diagnoses:  Abdominal pain, unspecified abdominal location  Vomiting in adult   Patient with history of pancreatitis presents  with abdominal pain, similar to previous pancreatitis flares. Patient reports drinking 2 beers last night likely cause of gastritis, pancreatitis. Plan to treat for pain, fluids, nausea medication. Reevaluation patient denies relief of symptoms after medication, no further emesis. Re-eval patient resting in room denies resolution of symptoms, pain medication ordered. 2004 reevaluation resting in room, reports persistent pain. Despite 4mg  IV dilaudid, Zofran, Phenergan, IV fluids patient has persistent abdominal discomfort plan to admit for pain control. 9:15 PM discussed with Dr. Hal Hope, agrees to admit the patient.   Harvie Heck, PA-C 09/29/14 [place], MD 09/30/14 [phone]

## 2014-09-29 NOTE — H&P (Signed)
[facility] History and Physical  Brent Rogers CZY:[id number] DOB: 1965-12-29 DOA: 09/29/2014  Referring physician: ER physician. PCP: No PCP Per Patient   Chief Complaint: Abdominal pain.  HPI: Brent Rogers is a 49 y.o. male with known history of pancreatitis, previous history of pancreatic surgery for pancreatic mass early part of the last year, presents to the ER because of increasing abdominal pain since morning. Patient states that he's been having persistent pain around the left upper quadrant and periumbilical area. Patient also has been having nausea vomiting. Since the pain is persistent patient came to the ER and labs revealed mildly elevated lipase. Patient has been admitted for further management of pain control most likely from pancreatitis. Patient has had a couple of beers 2 days ago. Patient states that he does not drink every day and his last drink prior to this one was a month ago. Denies any chest pain or shortness of breath. Denies any diarrhea. Denies any new medications.   Review of Systems: As presented in the history of presenting illness, rest negative.  Past Medical History  Diagnosis Date  . Scoliosis   . Pancreatic abnormality     CT  shows mass   Past Surgical History  Procedure Laterality Date  . Lumbar disc surgery  1990's  . Finger fracture surgery Left 1995    5th digit  . Eus N/A 09/21/2013    Procedure: ESOPHAGEAL ENDOSCOPIC ULTRASOUND (EUS) RADIAL;  Surgeon: Beryle Beams, MD;  Location: [facility];  Service: Endoscopy;  Laterality: N/A;  . Fine needle aspiration N/A 09/21/2013    Procedure: FINE NEEDLE ASPIRATION (FNA) LINEAR;  Surgeon: Beryle Beams, MD;  Location: [facility];  Service: Endoscopy;  Laterality: N/A;  . Back surgery  yrs ago    lower  . Eus N/A 09/30/2013    Procedure: UPPER ENDOSCOPIC ULTRASOUND (EUS) LINEAR;  Surgeon: Beryle Beams, MD;  Location: [facility];  Service: Endoscopy;  Laterality: N/A;  .  Laparoscopy N/A 12/01/2013    Procedure: LAPAROSCOPY DIAGNOSTIC PANCREATICODUODENECTOMY WITH BILIARY AND PANCREATIC STENTS;  Surgeon: Stark Klein, MD;  Location: [facility];  Service: General;  Laterality: N/A;  . Whipple procedure     Social History:  reports that he has been smoking Cigarettes.  He has a 2.5 pack-year smoking history. He has quit using smokeless tobacco. His smokeless tobacco use included Snuff. He reports that he drinks alcohol. He reports that he does not use illicit drugs. Where does patient live at home. Can patient participate in ADLs? Yes.  No Known Allergies  Family History:  Family History  Problem Relation Age of Onset  . Cancer Mother     unsure      Prior to Admission medications   Medication Sig Start Date End Date Taking? Authorizing Provider  acetaminophen (TYLENOL) 500 MG tablet Take 500 mg by mouth every 6 (six) hours as needed for moderate pain (stomach pain).   Yes Historical Provider, MD  Pancrelipase, Lip-Prot-Amyl, 24000 UNITS CPEP Take 1 capsule by mouth at bedtime. With a meal   Yes Historical Provider, MD  dicyclomine (BENTYL) 20 MG tablet Take 1 tablet (20 mg total) by mouth every 6 (six) hours as needed for spasms (for abdominal cramping). 08/15/14   Kalman Drape, MD  ondansetron (ZOFRAN ODT) 8 MG disintegrating tablet Take 1 tablet (8 mg total) by mouth every 8 (eight) hours as needed for nausea or vomiting. 08/15/14   Kalman Drape, MD  oxyCODONE (OXY IR/ROXICODONE)  5 MG immediate release tablet Take 1 tablet (5 mg total) by mouth every 6 (six) hours as needed for severe pain. 08/15/14   Kalman Drape, MD  promethazine (PHENERGAN) 12.5 MG tablet Take 1 tablet (12.5 mg total) by mouth every 6 (six) hours as needed for nausea or vomiting. 06/09/14   Ripudeep Krystal Eaton, MD    Physical Exam: Filed Vitals:   09/29/14 1740 09/29/14 1902 09/29/14 2012 09/29/14 2102  BP: 154/98 163/103 151/100 156/101  Pulse: 83 76 77 82  Temp:      TempSrc:      Resp: 17  16 18 18   SpO2: 99% 100% 100% 97%     General:  Moderately built and nourished.  Eyes: Anicteric no pallor.  ENT: No discharge from the ears eyes nose or mouth.  Neck: No mass felt.  Cardiovascular: S1 and S2 heard.  Respiratory: No rhonchi or crepitations.  Abdomen: Soft mild tenderness in the epigastric area. No guarding or rigidity.  Skin: No rash.  Musculoskeletal: No edema.  Psychiatric: Appears normal.  Neurologic: Alert awake oriented to time place and person. Moves all extremities.  Labs on Admission:  Basic Metabolic Panel:  Recent Labs Lab 09/29/14 1435  NA 140  K 4.1  CL 106  CO2 24  GLUCOSE 122*  BUN 6  CREATININE 0.66  CALCIUM 9.2   Liver Function Tests:  Recent Labs Lab 09/29/14 1435  AST 26  ALT 28  ALKPHOS 109  BILITOT 0.4  PROT 7.0  ALBUMIN 4.3    Recent Labs Lab 09/29/14 1435  LIPASE 76*   No results for input(s): AMMONIA in the last 168 hours. CBC:  Recent Labs Lab 09/29/14 1435  WBC 10.5  NEUTROABS 8.9*  HGB 13.9  HCT 41.9  MCV 87.3  PLT 216   Cardiac Enzymes: No results for input(s): CKTOTAL, CKMB, CKMBINDEX, TROPONINI in the last 168 hours.  BNP (last 3 results) No results for input(s): PROBNP in the last 8760 hours. CBG: No results for input(s): GLUCAP in the last 168 hours.  Radiological Exams on Admission: No results found.   Assessment/Plan Principal Problem:   Abdominal pain Active Problems:   Elevated blood pressure   1. Abdominal pain from pancreatitis - patient has persistent pain and rates it at around 10. Patient has had recent 2 CAT scans. We will check MRCP at this time. Patient will be kept nothing by mouth and IV fluids and pain relief medications. Check lipid panel for triglycerides. Patient states he drinks alcohol very rarely. 2. Elevated blood pressure - probably related to pain. Patient will be placed on when necessary IV hydralazine for systolic blood pressure more than 160. Closely  follow blood pressure trends. 3. History of pancreatic mass status post surgery.   DVT Prophylaxis Lovenox.  Code Status: Full code.  Family Communication: None.  Disposition Plan: Admit to inpatient.    KAKRAKANDY,ARSHAD N. [facility] Pager [phone].  If 7PM-7AM, please contact night-coverage [email or web address] Password [facility] 09/29/2014, 9:53 PM

## 2014-09-29 NOTE — ED Notes (Signed)
Per EMS: Pt from home c/o abd pain.  Hx of pancreatitis and believes this is a flare up.  Nausea this morning.  Diarrhea 3 days ago.

## 2014-09-30 ENCOUNTER — Observation Stay (HOSPITAL_COMMUNITY): Payer: Self-pay

## 2014-09-30 DIAGNOSIS — K859 Acute pancreatitis, unspecified: Secondary | ICD-10-CM

## 2014-09-30 DIAGNOSIS — R109 Unspecified abdominal pain: Secondary | ICD-10-CM

## 2014-09-30 LAB — GLUCOSE, CAPILLARY
Glucose-Capillary: 100 mg/dL — ABNORMAL HIGH (ref 70–99)
Glucose-Capillary: 101 mg/dL — ABNORMAL HIGH (ref 70–99)
Glucose-Capillary: 119 mg/dL — ABNORMAL HIGH (ref 70–99)

## 2014-09-30 LAB — HEPATIC FUNCTION PANEL
ALT: 25 U/L (ref 0–53)
AST: 23 U/L (ref 0–37)
Albumin: 3.4 g/dL — ABNORMAL LOW (ref 3.5–5.2)
Alkaline Phosphatase: 98 U/L (ref 39–117)
Bilirubin, Direct: 0.2 mg/dL (ref 0.0–0.3)
Indirect Bilirubin: 1.1 mg/dL — ABNORMAL HIGH (ref 0.3–0.9)
Total Bilirubin: 1.3 mg/dL — ABNORMAL HIGH (ref 0.3–1.2)
Total Protein: 5.8 g/dL — ABNORMAL LOW (ref 6.0–8.3)

## 2014-09-30 LAB — BASIC METABOLIC PANEL
Anion gap: 8 (ref 5–15)
BUN: 9 mg/dL (ref 6–23)
CO2: 25 mmol/L (ref 19–32)
Calcium: 8.5 mg/dL (ref 8.4–10.5)
Chloride: 105 mEq/L (ref 96–112)
Creatinine, Ser: 0.61 mg/dL (ref 0.50–1.35)
GFR calc Af Amer: >90 mL/min (ref >90)
GFR calc non Af Amer: >90 mL/min (ref >90)
Glucose, Bld: 92 mg/dL (ref 70–99)
Potassium: 3.6 mmol/L (ref 3.5–5.1)
Sodium: 138 mmol/L (ref 135–145)

## 2014-09-30 LAB — LIPID PANEL
Cholesterol: 121 mg/dL (ref 0–200)
HDL: 73 mg/dL (ref >39)
LDL Cholesterol: 35 mg/dL (ref 0–99)
Total CHOL/HDL Ratio: 1.7 RATIO
Triglycerides: 64 mg/dL (ref <150)
VLDL: 13 mg/dL (ref 0–40)

## 2014-09-30 LAB — TROPONIN I: Troponin I: <0.03 ng/mL (ref <0.031)

## 2014-09-30 MED ORDER — IOHEXOL 300 MG/ML  SOLN
100.0000 mL | Freq: Once | INTRAMUSCULAR | Status: AC | PRN
  Administered 2014-09-30: 100 mL via INTRAVENOUS

## 2014-09-30 MED ORDER — ONDANSETRON 8 MG PO TBDP: 8.0000 mg | ORAL_TABLET | Freq: Three times a day (TID) | ORAL | Status: DC | PRN

## 2014-09-30 MED ORDER — TRAMADOL HCL 50 MG PO TABS: 50.0000 mg | ORAL_TABLET | Freq: Four times a day (QID) | ORAL | Status: DC | PRN

## 2014-09-30 MED ORDER — IOHEXOL 300 MG/ML  SOLN
50.0000 mL | Freq: Once | INTRAMUSCULAR | Status: AC | PRN
  Administered 2014-09-30: 50 mL via ORAL

## 2014-09-30 NOTE — Progress Notes (Signed)
Pt discharged to home. Pt prescriptions given. Pt had no questions concerning discharge. Pt was walked down to ED.  Pt otherwise stable. No changes since AM assessment.    MCCLAIN, Brent Rogers 09/30/2014

## 2014-09-30 NOTE — Discharge Instructions (Signed)

## 2014-09-30 NOTE — Discharge Summary (Signed)
Physician Discharge Summary  Brent Rogers [id number]:[id number] DOB: 1966-07-31 DOA: 09/29/2014  PCP: No PCP Per Patient  Admit date: 09/29/2014 Discharge date: 09/30/2014  Recommendations for Outpatient Follow-up:  1. Check CBC and BMP during next visit to PCP. 2. CT Abdomen on this admission did not reveal acute intracranial findings.  Discharge Diagnoses:  Principal Problem:   Abdominal pain Active Problems:   Elevated blood pressure    Discharge Condition: stable   Diet recommendation: as tolerated   History of present illness:  49 y.o. male with known history of pancreatitis, previous history of pancreatic surgery for pancreatic mass early part of the last year, presented  to the ER because of increasing abdominal pain since morning PTA. Initial lipase was 76. Pt was admitted for further evaluation and management.    Hospital Course:   Principal Problem:   Abdominal pain / Acute pancreatitis - lipase level was 76 on admission - no acute pancreatitis on CT abdomen - pt tolerated regular diet - pt stable for discharge; he reports he wants to go home today.    SignedLeisa Lenz, MD  [facility] 09/30/2014, 12:08 PM  Pager #: [phone]  Procedures:  None   Consultations:  None   Discharge Exam: Filed Vitals:   09/30/14 0548  BP: 119/77  Pulse: 66  Temp: 98.1 F (36.7 C)  Resp: 16   Filed Vitals:   09/29/14 2012 09/29/14 2102 09/29/14 2214 09/30/14 0548  BP: 151/100 156/101 153/108 119/77  Pulse: 77 82 81 66  Temp:   97.6 F (36.4 C) 98.1 F (36.7 C)  TempSrc:   Oral Oral  Resp: 18 18 16 16   Height:   6\' 3"  (1.905 m)   Weight:   62.959 kg (138 lb 12.8 oz)   SpO2: 100% 97% 99% 100%    General: Pt is alert, follows commands appropriately, not in acute distress Cardiovascular: Regular rate and rhythm, S1/S2 +, no murmurs Respiratory: Clear to auscultation bilaterally, no wheezing, no crackles, no rhonchi Abdominal: Soft, non tender, non  distended, bowel sounds +, no guarding Extremities: no edema, no cyanosis, pulses palpable bilaterally DP and PT Neuro: Grossly nonfocal  Discharge Instructions  Discharge Instructions    Call MD for:  difficulty breathing, headache or visual disturbances    Complete by:  As directed      Call MD for:  persistant nausea and vomiting    Complete by:  As directed      Call MD for:  severe uncontrolled pain    Complete by:  As directed      Diet - low sodium heart healthy    Complete by:  As directed      Discharge instructions    Complete by:  As directed   CAT scan this admission did not show acute intra-abdominal findings. No pancreatitis evidence on CT scan.     Increase activity slowly    Complete by:  As directed             Medication List    TAKE these medications        acetaminophen 500 MG tablet  Commonly known as:  TYLENOL  Take 500 mg by mouth every 6 (six) hours as needed for moderate pain (stomach pain).     dicyclomine 20 MG tablet  Commonly known as:  BENTYL  Take 1 tablet (20 mg total) by mouth every 6 (six) hours as needed for spasms (for abdominal cramping).     ondansetron  8 MG disintegrating tablet  Commonly known as:  ZOFRAN ODT  Take 1 tablet (8 mg total) by mouth every 8 (eight) hours as needed for nausea or vomiting.     oxyCODONE 5 MG immediate release tablet  Commonly known as:  Oxy IR/ROXICODONE  Take 1 tablet (5 mg total) by mouth every 6 (six) hours as needed for severe pain.     Pancrelipase (Lip-Prot-Amyl) 24000 UNITS Cpep  Take 1 capsule by mouth at bedtime. With a meal     promethazine 12.5 MG tablet  Commonly known as:  PHENERGAN  Take 1 tablet (12.5 mg total) by mouth every 6 (six) hours as needed for nausea or vomiting.     traMADol 50 MG tablet  Commonly known as:  ULTRAM  Take 1 tablet (50 mg total) by mouth every 6 (six) hours as needed.           Follow-up Information    Follow up with No PCP Per Patient.   Specialty:   General Practice       The results of significant diagnostics from this hospitalization (including imaging, microbiology, ancillary and laboratory) are listed below for reference.    Significant Diagnostic Studies: Ct Abdomen Pelvis W Contrast  09/30/2014   CLINICAL DATA:  Abdominal pain in the left upper quadrant and periumbilical area with nausea and vomiting. History of pancreatitis and prior Whipple surgery with benign pathology.  EXAM: CT ABDOMEN AND PELVIS WITH CONTRAST  TECHNIQUE: Multidetector CT imaging of the abdomen and pelvis was performed using the standard protocol following bolus administration of intravenous contrast.  CONTRAST:  36mL OMNIPAQUE IOHEXOL 300 MG/ML SOLN, 160mL OMNIPAQUE IOHEXOL 300 MG/ML SOLN  COMPARISON:  08/15/2014 as well as additional prior CT and MRI examinations of the abdomen.  FINDINGS: Stable postoperative appearance following prior Whipple procedure. Residual pancreas shows no evidence of surrounding inflammation by CT. No evidence of peripancreatic fluid collection. No pancreatic ductal dilatation identified. There is no evidence of pancreatic mass.  The liver, spleen, adrenal glands and kidneys are unremarkable. The gallbladder has been removed. No biliary ductal dilatation is identified.  Bowel loops show no evidence of obstruction, ileus or inflammation. No free air, free fluid or abscess is identified. Gastric jejunal anastomosis appears normally patent. No dilated bowel loops. No vascular abnormalities. The portal vein and splenic vein show normal patency.  No masses or enlarged lymph nodes are seen. No evidence of hernia. The bladder is unremarkable. 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  IMPRESSION: No acute findings in the abdomen or pelvis. Stable postoperative anatomy following prior Whipple procedure.   Electronically Signed   By: Aletta Edouard M.D.   On: 09/30/2014 10:32    Microbiology: No results found for this or any previous  visit (from the past 240 hour(s)).   Labs: Basic Metabolic Panel:  Recent Labs Lab 09/29/14 1435 09/30/14 0500  NA 140 138  K 4.1 3.6  CL 106 105  CO2 24 25  GLUCOSE 122* 92  BUN 6 9  CREATININE 0.66 0.61  CALCIUM 9.2 8.5   Liver Function Tests:  Recent Labs Lab 09/29/14 1435 09/30/14 0500  AST 26 23  ALT 28 25  ALKPHOS 109 98  BILITOT 0.4 1.3*  PROT 7.0 5.8*  ALBUMIN 4.3 3.4*    Recent Labs Lab 09/29/14 1435  LIPASE 76*   No results for input(s): AMMONIA in the last 168 hours. CBC:  Recent Labs Lab 09/29/14 1435  WBC 10.5  NEUTROABS 8.9*  HGB 13.9  HCT 41.9  MCV 87.3  PLT 216   Cardiac Enzymes:  Recent Labs Lab 09/30/14 0533  TROPONINI <0.03   BNP: BNP (last 3 results) No results for input(s): PROBNP in the last 8760 hours. CBG:  Recent Labs Lab 09/30/14 0015 09/30/14 0611  GLUCAP 100* 101*    Time coordinating discharge: Over 30 minutes

## 2014-11-20 ENCOUNTER — Encounter (HOSPITAL_COMMUNITY): Payer: Self-pay | Admitting: Emergency Medicine

## 2014-11-20 ENCOUNTER — Emergency Department (HOSPITAL_COMMUNITY): Payer: Self-pay

## 2014-11-20 ENCOUNTER — Inpatient Hospital Stay (HOSPITAL_COMMUNITY)
Admission: EM | Admit: 2014-11-20 | Discharge: 2014-11-23 | DRG: 438 | Disposition: A | Discharge status: Home / Self Care | Payer: Self-pay | Attending: Internal Medicine | Admitting: Internal Medicine

## 2014-11-20 DIAGNOSIS — R1013 Epigastric pain: Secondary | ICD-10-CM | POA: Diagnosis present

## 2014-11-20 DIAGNOSIS — R112 Nausea with vomiting, unspecified: Secondary | ICD-10-CM | POA: Diagnosis present

## 2014-11-20 DIAGNOSIS — Z681 Body mass index (BMI) 19 or less, adult: Secondary | ICD-10-CM

## 2014-11-20 DIAGNOSIS — E43 Unspecified severe protein-calorie malnutrition: Secondary | ICD-10-CM

## 2014-11-20 DIAGNOSIS — K859 Acute pancreatitis without necrosis or infection, unspecified: Secondary | ICD-10-CM | POA: Diagnosis present

## 2014-11-20 DIAGNOSIS — F17210 Nicotine dependence, cigarettes, uncomplicated: Secondary | ICD-10-CM | POA: Diagnosis present

## 2014-11-20 DIAGNOSIS — M419 Scoliosis, unspecified: Secondary | ICD-10-CM | POA: Diagnosis present

## 2014-11-20 DIAGNOSIS — R1110 Vomiting, unspecified: Secondary | ICD-10-CM

## 2014-11-20 DIAGNOSIS — K858 Other acute pancreatitis: Secondary | ICD-10-CM

## 2014-11-20 DIAGNOSIS — K861 Other chronic pancreatitis: Secondary | ICD-10-CM | POA: Diagnosis present

## 2014-11-20 HISTORY — DX: Other chronic pancreatitis: K86.1

## 2014-11-20 LAB — CBC
HCT: 42.5 % (ref 39.0–52.0)
Hemoglobin: 14.1 g/dL (ref 13.0–17.0)
MCH: 29.1 pg (ref 26.0–34.0)
MCHC: 33.2 g/dL (ref 30.0–36.0)
MCV: 87.8 fL (ref 78.0–100.0)
Platelets: 181 10*3/uL (ref 150–400)
RBC: 4.84 MIL/uL (ref 4.22–5.81)
RDW: 14.1 % (ref 11.5–15.5)
WBC: 11.7 10*3/uL — ABNORMAL HIGH (ref 4.0–10.5)

## 2014-11-20 LAB — COMPREHENSIVE METABOLIC PANEL
ALT: 22 U/L (ref 0–53)
AST: 39 U/L — ABNORMAL HIGH (ref 0–37)
Albumin: 4.3 g/dL (ref 3.5–5.2)
Alkaline Phosphatase: 130 U/L — ABNORMAL HIGH (ref 39–117)
Anion gap: 12 (ref 5–15)
BUN: 9 mg/dL (ref 6–23)
CO2: 25 mmol/L (ref 19–32)
Calcium: 9.8 mg/dL (ref 8.4–10.5)
Chloride: 98 mmol/L (ref 96–112)
Creatinine, Ser: 0.91 mg/dL (ref 0.50–1.35)
GFR calc Af Amer: >90 mL/min (ref >90)
GFR calc non Af Amer: >90 mL/min (ref >90)
Glucose, Bld: 143 mg/dL — ABNORMAL HIGH (ref 70–99)
Potassium: 4.8 mmol/L (ref 3.5–5.1)
Sodium: 135 mmol/L (ref 135–145)
Total Bilirubin: 1.2 mg/dL (ref 0.3–1.2)
Total Protein: 6.9 g/dL (ref 6.0–8.3)

## 2014-11-20 LAB — BASIC METABOLIC PANEL
Anion gap: 8 (ref 5–15)
BUN: 7 mg/dL (ref 6–23)
CO2: 25 mmol/L (ref 19–32)
Calcium: 9 mg/dL (ref 8.4–10.5)
Chloride: 101 mmol/L (ref 96–112)
Creatinine, Ser: 0.84 mg/dL (ref 0.50–1.35)
GFR calc Af Amer: >90 mL/min (ref >90)
GFR calc non Af Amer: >90 mL/min (ref >90)
Glucose, Bld: 155 mg/dL — ABNORMAL HIGH (ref 70–99)
Potassium: 3.7 mmol/L (ref 3.5–5.1)
Sodium: 134 mmol/L — ABNORMAL LOW (ref 135–145)

## 2014-11-20 LAB — CBC WITH DIFFERENTIAL/PLATELET
Basophils Absolute: 0 10*3/uL (ref 0.0–0.1)
Basophils Relative: 0 % (ref 0–1)
Eosinophils Absolute: 0 10*3/uL (ref 0.0–0.7)
Eosinophils Relative: 0 % (ref 0–5)
HCT: 46.2 % (ref 39.0–52.0)
Hemoglobin: 15.8 g/dL (ref 13.0–17.0)
Lymphocytes Relative: 14 % (ref 12–46)
Lymphs Abs: 1.3 10*3/uL (ref 0.7–4.0)
MCH: 29.6 pg (ref 26.0–34.0)
MCHC: 34.2 g/dL (ref 30.0–36.0)
MCV: 86.5 fL (ref 78.0–100.0)
Monocytes Absolute: 0.9 10*3/uL (ref 0.1–1.0)
Monocytes Relative: 10 % (ref 3–12)
Neutro Abs: 7.2 10*3/uL (ref 1.7–7.7)
Neutrophils Relative %: 76 % (ref 43–77)
Platelets: 204 10*3/uL (ref 150–400)
RBC: 5.34 MIL/uL (ref 4.22–5.81)
RDW: 14.1 % (ref 11.5–15.5)
WBC: 9.5 10*3/uL (ref 4.0–10.5)

## 2014-11-20 LAB — LIPASE, BLOOD: Lipase: 142 U/L — ABNORMAL HIGH (ref 11–59)

## 2014-11-20 LAB — I-STAT TROPONIN, ED: Troponin i, poc: 0 ng/mL (ref 0.00–0.08)

## 2014-11-20 MED ORDER — INFLUENZA VAC SPLIT QUAD 0.5 ML IM SUSY
0.5000 mL | PREFILLED_SYRINGE | INTRAMUSCULAR | Status: AC
  Administered 2014-11-22: 0.5 mL via INTRAMUSCULAR
  Filled 2014-11-20: qty 0.5

## 2014-11-20 MED ORDER — ACETAMINOPHEN 325 MG PO TABS: 650.0000 mg | ORAL_TABLET | Freq: Four times a day (QID) | ORAL | Status: DC | PRN

## 2014-11-20 MED ORDER — FENTANYL CITRATE 0.05 MG/ML IJ SOLN
50.0000 ug | Freq: Once | INTRAMUSCULAR | Status: AC
  Administered 2014-11-20: 50 ug via INTRAVENOUS
  Filled 2014-11-20: qty 2

## 2014-11-20 MED ORDER — BOOST / RESOURCE BREEZE PO LIQD: 1.0000 | Freq: Three times a day (TID) | ORAL | Status: DC

## 2014-11-20 MED ORDER — PROMETHAZINE HCL 25 MG/ML IJ SOLN: 12.5000 mg | Freq: Four times a day (QID) | INTRAMUSCULAR | Status: DC | PRN

## 2014-11-20 MED ORDER — IOHEXOL 300 MG/ML  SOLN
25.0000 mL | INTRAMUSCULAR | Status: AC
  Administered 2014-11-20: 25 mL via ORAL

## 2014-11-20 MED ORDER — ACETAMINOPHEN 650 MG RE SUPP: 650.0000 mg | Freq: Four times a day (QID) | RECTAL | Status: DC | PRN

## 2014-11-20 MED ORDER — IOHEXOL 300 MG/ML  SOLN
100.0000 mL | Freq: Once | INTRAMUSCULAR | Status: AC | PRN
  Administered 2014-11-20: 100 mL via INTRAVENOUS

## 2014-11-20 MED ORDER — SODIUM CHLORIDE 0.9 % IV BOLUS (SEPSIS)
1000.0000 mL | Freq: Once | INTRAVENOUS | Status: AC
  Administered 2014-11-20: 1000 mL via INTRAVENOUS

## 2014-11-20 MED ORDER — OXYCODONE HCL 5 MG PO TABS
5.0000 mg | ORAL_TABLET | ORAL | Status: DC | PRN
  Administered 2014-11-20 – 2014-11-23 (×11): 5 mg via ORAL
  Filled 2014-11-20 (×11): qty 1

## 2014-11-20 MED ORDER — ONDANSETRON HCL 4 MG/2ML IJ SOLN
INTRAMUSCULAR | Status: AC
  Filled 2014-11-20: qty 2

## 2014-11-20 MED ORDER — ONDANSETRON HCL 4 MG/2ML IJ SOLN
4.0000 mg | Freq: Once | INTRAMUSCULAR | Status: AC
  Administered 2014-11-20: 4 mg via INTRAVENOUS

## 2014-11-20 MED ORDER — HYDROMORPHONE HCL 1 MG/ML IJ SOLN
1.0000 mg | Freq: Once | INTRAMUSCULAR | Status: AC
  Administered 2014-11-20: 1 mg via INTRAVENOUS
  Filled 2014-11-20: qty 1

## 2014-11-20 MED ORDER — ONDANSETRON HCL 4 MG/2ML IJ SOLN
4.0000 mg | Freq: Four times a day (QID) | INTRAMUSCULAR | Status: DC | PRN
  Administered 2014-11-20: 4 mg via INTRAVENOUS
  Filled 2014-11-20 (×2): qty 2

## 2014-11-20 MED ORDER — ALUM & MAG HYDROXIDE-SIMETH 200-200-20 MG/5ML PO SUSP: 30.0000 mL | Freq: Four times a day (QID) | ORAL | Status: DC | PRN

## 2014-11-20 MED ORDER — ONDANSETRON HCL 4 MG PO TABS: 4.0000 mg | ORAL_TABLET | Freq: Four times a day (QID) | ORAL | Status: DC | PRN

## 2014-11-20 MED ORDER — SODIUM CHLORIDE 0.9 % IV SOLN
INTRAVENOUS | Status: DC
  Administered 2014-11-20: 100 mL/h via INTRAVENOUS
  Administered 2014-11-21 – 2014-11-22 (×3): via INTRAVENOUS

## 2014-11-20 MED ORDER — ONDANSETRON HCL 4 MG/2ML IJ SOLN
4.0000 mg | Freq: Once | INTRAMUSCULAR | Status: AC
  Administered 2014-11-20: 4 mg via INTRAVENOUS
  Filled 2014-11-20: qty 2

## 2014-11-20 MED ORDER — HYDROMORPHONE HCL 1 MG/ML IJ SOLN
2.0000 mg | INTRAMUSCULAR | Status: DC | PRN
  Administered 2014-11-20: 1 mg via INTRAVENOUS
  Administered 2014-11-20 – 2014-11-23 (×10): 2 mg via INTRAVENOUS
  Filled 2014-11-20 (×11): qty 2

## 2014-11-20 MED ORDER — HYDROMORPHONE HCL 1 MG/ML IJ SOLN
0.5000 mg | INTRAMUSCULAR | Status: DC | PRN
  Administered 2014-11-20 (×2): 1 mg via INTRAVENOUS
  Filled 2014-11-20 (×2): qty 1

## 2014-11-20 MED ORDER — ENOXAPARIN SODIUM 40 MG/0.4ML ~~LOC~~ SOLN
40.0000 mg | SUBCUTANEOUS | Status: DC
  Administered 2014-11-20 – 2014-11-22 (×3): 40 mg via SUBCUTANEOUS
  Filled 2014-11-20 (×3): qty 0.4

## 2014-11-20 NOTE — H&P (Signed)
[facility] Admission History and Physical       Brent Rogers HYW:[id number] DOB: 1966/06/28 DOA: 11/20/2014  Referring physician: EDP PCP: No PCP Per Patient  Specialists:   Chief Complaint: ABD Pain with Nausea and Vomiting  HPI: Brent Rogers is a 49 y.o. 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   He reports having bilious emesis.    He denies any fevers or chills.    In the ED,  his lipase level was elevated at 142, and a CT scan of the ABD /Pelvis revealed Acute Pancreatitis.  He was referred for medical admission.     Review of Systems:  Constitutional: No Weight Loss, No Weight Gain, Night Sweats, Fevers, Chills, Dizziness, Light Headedness, Fatigue, or Generalized Weakness HEENT: No Headaches, Difficulty Swallowing,Tooth/Dental Problems,Sore Throat,  No Sneezing, Rhinitis, Ear Ache, Nasal Congestion, or Post Nasal Drip,  Cardio-vascular:  No Chest pain, Orthopnea, PND, Edema in Lower Extremities, Anasarca, Dizziness, Palpitations  Resp: No Dyspnea, No DOE, No Productive Cough, No Non-Productive Cough, No Hemoptysis, No Wheezing.    GI: No Heartburn, Indigestion, Abdominal Pain, Nausea, Vomiting, Diarrhea, Constipation, Hematemesis, Hematochezia, Melena, Change in Bowel Habits,  Loss of Appetite  GU: No Dysuria, No Change in Color of Urine, No Urgency or Urinary Frequency, No Flank pain.  Musculoskeletal: No Joint Pain or Swelling, No Decreased Range of Motion, No Back Pain.  Neurologic: No Syncope, No Seizures, Muscle Weakness, Paresthesia, Vision Disturbance or Loss, No Diplopia, No Vertigo, No Difficulty Walking,  Skin: No Rash or Lesions. Psych: No Change in Mood or Affect, No Depression or Anxiety, No Memory loss, No Confusion, or Hallucinations   Past Medical History  Diagnosis Date  . Scoliosis   .  Pancreatic abnormality     CT  shows mass     Past Surgical History  Procedure Laterality Date  . Lumbar disc surgery  1990's  . Finger fracture surgery Left 1995    5th digit  . Eus N/A 09/21/2013    Procedure: ESOPHAGEAL ENDOSCOPIC ULTRASOUND (EUS) RADIAL;  Surgeon: Beryle Beams, MD;  Location: [facility];  Service: Endoscopy;  Laterality: N/A;  . Fine needle aspiration N/A 09/21/2013    Procedure: FINE NEEDLE ASPIRATION (FNA) LINEAR;  Surgeon: Beryle Beams, MD;  Location: [facility];  Service: Endoscopy;  Laterality: N/A;  . Back surgery  yrs ago    lower  . Eus N/A 09/30/2013    Procedure: UPPER ENDOSCOPIC ULTRASOUND (EUS) LINEAR;  Surgeon: Beryle Beams, MD;  Location: [facility];  Service: Endoscopy;  Laterality: N/A;  . Laparoscopy N/A 12/01/2013    Procedure: LAPAROSCOPY DIAGNOSTIC PANCREATICODUODENECTOMY WITH BILIARY AND PANCREATIC STENTS;  Surgeon: Stark Klein, MD;  Location: [facility];  Service: General;  Laterality: N/A;  . Whipple procedure        Prior to Admission medications   Medication Sig Start Date End Date Taking? Authorizing Provider  acetaminophen (TYLENOL) 500 MG tablet Take 500 mg by mouth every 6 (six) hours as needed for moderate pain (stomach pain).   Yes Historical Provider, MD  bismuth subsalicylate (PEPTO BISMOL) 262 MG chewable tablet Chew 524 mg by mouth 3 (three) times daily as needed for indigestion.   Yes Historical Provider, MD  Pancrelipase, Lip-Prot-Amyl, 24000 UNITS CPEP Take 1 capsule by mouth at bedtime. With a meal   Yes  Historical Provider, MD  dicyclomine (BENTYL) 20 MG tablet Take 1 tablet (20 mg total) by mouth every 6 (six) hours as needed for spasms (for abdominal cramping). Patient not taking: Reported on 11/20/2014 08/15/14   Kalman Drape, MD  ondansetron (ZOFRAN ODT) 8 MG disintegrating tablet Take 1 tablet (8 mg total) by mouth every 8 (eight) hours as needed for nausea or vomiting. Patient not taking: Reported on 11/20/2014  09/30/14   Robbie Lis, MD  oxyCODONE (OXY IR/ROXICODONE) 5 MG immediate release tablet Take 1 tablet (5 mg total) by mouth every 6 (six) hours as needed for severe pain. Patient not taking: Reported on 11/20/2014 08/15/14   Kalman Drape, MD  promethazine (PHENERGAN) 12.5 MG tablet Take 1 tablet (12.5 mg total) by mouth every 6 (six) hours as needed for nausea or vomiting. Patient not taking: Reported on 11/20/2014 06/09/14   Ripudeep Krystal Eaton, MD  traMADol (ULTRAM) 50 MG tablet Take 1 tablet (50 mg total) by mouth every 6 (six) hours as needed. Patient not taking: Reported on 11/20/2014 09/30/14   Robbie Lis, MD     No Known Allergies  Social History:  reports that he has been smoking Cigarettes.  He has a 2.5 pack-year smoking history. He has quit using smokeless tobacco. His smokeless tobacco use included Snuff. He reports that he drinks alcohol. He reports that he does not use illicit drugs.    Family History  Problem Relation Age of Onset  . Cancer Mother     unsure       Physical Exam:  GEN:  Pleasant Well Nourished and well Developed  49 y.o. African American  male examined and in discomfort but no acute distress; cooperative with exam Filed Vitals:   11/20/14 0330 11/20/14 0345 11/20/14 0400 11/20/14 0415  BP: 147/106 132/97 157/105 137/98  Pulse: 76 83 86 76  Temp:      TempSrc:      Resp: 17 20 17 15   SpO2: 99% 99% 100% 98%   Blood pressure 137/98, pulse 76, temperature 97.8 F (36.6 C), temperature source Oral, resp. rate 15, SpO2 98 %. PSYCH: He is alert and oriented x4; does not appear anxious does not appear depressed; affect is normal HEENT: Normocephalic and Atraumatic, Mucous membranes pink; PERRLA; EOM intact; Fundi:  Benign;  No scleral icterus, Nares: Patent, Oropharynx: Clear, Fair Dentition,    Neck:  FROM, No Cervical Lymphadenopathy nor Thyromegaly or Carotid Bruit; No JVD; Breasts:: Not examined CHEST WALL: No tenderness CHEST: Normal respiration, clear to  auscultation bilaterally HEART: Regular rate and rhythm; no murmurs rubs or gallops BACK: No kyphosis or scoliosis; No CVA tenderness ABDOMEN: Positive Bowel Sounds,Soft , +Epigastric Tenderness, No Rebound or Guarding; No Masses, No Organomegaly Rectal Exam: Not done EXTREMITIES: No Cyanosis, Clubbing, or Edema; No Ulcerations. Genitalia: not examined PULSES: 2+ and symmetric SKIN: Normal hydration no rash or ulceration CNS:  Alert and Oriented x 4, No Focal Deficits Vascular: pulses palpable throughout    Labs on Admission:  Basic Metabolic Panel:  Recent Labs Lab 11/20/14 0024  NA 135  K 4.8  CL 98  CO2 25  GLUCOSE 143*  BUN 9  CREATININE 0.91  CALCIUM 9.8   Liver Function Tests:  Recent Labs Lab 11/20/14 0024  AST 39*  ALT 22  ALKPHOS 130*  BILITOT 1.2  PROT 6.9  ALBUMIN 4.3    Recent Labs Lab 11/20/14 0034  LIPASE 142*   No results for input(s): AMMONIA in  the last 168 hours. CBC:  Recent Labs Lab 11/20/14 0024  WBC 9.5  NEUTROABS 7.2  HGB 15.8  HCT 46.2  MCV 86.5  PLT 204   Cardiac Enzymes: No results for input(s): CKTOTAL, CKMB, CKMBINDEX, TROPONINI in the last 168 hours.  BNP (last 3 results) No results for input(s): BNP in the last 8760 hours.  ProBNP (last 3 results) No results for input(s): PROBNP in the last 8760 hours.  CBG: No results for input(s): GLUCAP in the last 168 hours.  Radiological Exams on Admission: Ct Abdomen Pelvis W Contrast  11/20/2014   CLINICAL DATA:  Initial evaluation for gradual onset persistent epigastric abdominal pain.  EXAM: CT ABDOMEN AND PELVIS WITH CONTRAST  TECHNIQUE: Multidetector CT imaging of the abdomen and pelvis was performed using the standard protocol following bolus administration of intravenous contrast.  CONTRAST:  17mL OMNIPAQUE IOHEXOL 300 MG/ML  SOLN  COMPARISON:  Prior study from 09/30/2014.  FINDINGS: Visualized lung bases are clear. No pleural or pericardial effusion.  The liver  demonstrates a normal contrast enhanced appearance. Gallbladder is absent. No biliary dilatation.  Spleen within normal limits.  Adrenal glands are normal.  Sequelae of prior Whipple procedure again seen. There are is prominent peripancreatic stranding with fluid density, suggesting acute pancreatitis. Changes are primarily localized about the proximal pancreatic body at the midline. No evidence for pancreatic necrosis. No peripancreatic fluid collection.  Scattered cysts noted within the kidneys bilaterally. No nephrolithiasis, hydronephrosis, or focal enhancing renal mass.  Stomach within normal limits. No evidence for bowel obstruction. No acute inflammatory changes seen about the bowels. Appendix is normal.  Bladder and prostate within normal limits.  No free air or fluid. No pathologically enlarged intra-abdominal pelvic lymph nodes. Scattered atheromatous plaque noted within the intra-abdominal aorta which is mildly torturous. No intra-abdominal aneurysm.  IMPRESSION: 1. Findings compatible with acute pancreatitis involving the pancreatic body. No evidence for necrosis. No peripancreatic fluid collection identified. 2. No other acute intra-abdominal or pelvic process. 3. Sequelae of prior Whipple procedure.   Electronically Signed   By: Jeannine Boga M.D.   On: 11/20/2014 03:00      Assessment/Plan:   48 y.o. male with  Active Problems:   1.    Acute pancreatitis   Clear Liquids advance as tolerated   Pain Control PRN   Monitor Lipase levels     2.    ABD Pain- Due to #1   Pain Control PRN     3.    Nausea and Vomiting   IV anti-emetics PRN      4.   DVT Prophylaxis    Lovenox           Code Status:     FULL CODE        Family Communication:     No Family Present    Disposition Plan:    Inpatient          Time spent:  [address] [facility] Pager [phone]   If [place] Please Contact the Day Rounding Team MD for [facility]  If 7PM-7AM, Please Contact Night-Floor Coverage  [email or web address] Password [facility] 11/20/2014, 4:50 AM

## 2014-11-20 NOTE — Progress Notes (Signed)
Utilization Review Completed.Donne Anon T2/22/2016

## 2014-11-20 NOTE — Progress Notes (Signed)
[facility] Progress Note   Brent Rogers OZH:[id number] DOB: 1966-04-07 DOA: 11/20/2014 PCP: No PCP Per Patient  Brief narrative: Brent Rogers is a 49 y.o. 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    Subjective: C/o severe pain in abdomen radiating to his back - taking clears without trouble  Assessment/Plan: Principal Problem:   Acute pancreatitis - etiology uncertain - cont pain control- cont sips of clears for now and cont to hydrate  Active Problems:    Protein-calorie malnutrition, severe - will order supplements once able to eat   Code Status: full code Family Communication:  Disposition Plan: home when stable DVT prophylaxis: Lovenox  Consultants:   Procedures:   Antibiotics: Anti-infectives    None         Objective: Filed Weights   11/20/14 0545  Weight: 61.19 kg (134 lb 14.4 oz)    Intake/Output Summary (Last 24 hours) at 11/20/14 1544 Last data filed at 11/20/14 1241  Gross per 24 hour  Intake      0 ml  Output    125 ml  Net   -125 ml     Vitals Filed Vitals:   11/20/14 0500 11/20/14 0515 11/20/14 0545 11/20/14 1241  BP: 145/104 153/102 157/104 139/103  Pulse: 71 80 85 71  Temp:   97.8 F (36.6 C) 97.7 F (36.5 C)  TempSrc:   Oral   Resp: 11 18 18 16   Height:   6\' 3"  (1.905 m)   Weight:   61.19 kg (134 lb 14.4 oz)   SpO2: 99% 100% 100% 100%    Exam: General: AAO x3, No acute respiratory distress Lungs: Clear to auscultation bilaterally without wheezes or crackles Cardiovascular: Regular rate and rhythm without murmur gallop or rub normal S1 and S2 Abdomen: mild diffuse tenderness, nondistended, soft, bowel sounds positive, no rebound, no ascites, no appreciable mass Extremities: No significant cyanosis, clubbing, or edema bilateral lower extremities  Data Reviewed: Basic Metabolic Panel:  Recent Labs Lab  11/20/14 0024 11/20/14 0611  NA 135 134*  K 4.8 3.7  CL 98 101  CO2 25 25  GLUCOSE 143* 155*  BUN 9 7  CREATININE 0.91 0.84  CALCIUM 9.8 9.0   Liver Function Tests:  Recent Labs Lab 11/20/14 0024  AST 39*  ALT 22  ALKPHOS 130*  BILITOT 1.2  PROT 6.9  ALBUMIN 4.3    Recent Labs Lab 11/20/14 0034  LIPASE 142*   No results for input(s): AMMONIA in the last 168 hours. CBC:  Recent Labs Lab 11/20/14 0024 11/20/14 0611  WBC 9.5 11.7*  NEUTROABS 7.2  --   HGB 15.8 14.1  HCT 46.2 42.5  MCV 86.5 87.8  PLT 204 181   Cardiac Enzymes: No results for input(s): CKTOTAL, CKMB, CKMBINDEX, TROPONINI in the last 168 hours. BNP (last 3 results) No results for input(s): BNP in the last 8760 hours.  ProBNP (last 3 results) No results for input(s): PROBNP in the last 8760 hours.  CBG: No results for input(s): GLUCAP in the last 168 hours.  No results found for this or any previous visit (from the past 240 hour(s)).   Studies:  Recent x-ray studies have been reviewed in detail by the Attending Physician  Scheduled Meds:  Scheduled Meds: . enoxaparin (LOVENOX) injection  40 mg Subcutaneous Q24H  . [START ON 11/21/2014] feeding supplement (RESOURCE BREEZE)  1 Container Oral TID BM  Continuous Infusions: . sodium chloride 100 mL/hr (11/20/14 0603)    Time spent on care of this patient:   Aman Bonet, MD 11/20/2014, 3:44 PM  LOS: 0 days   [facility] Office  [phone] Pager - Text Page per [email or web address]  If 7PM-7AM, please contact night-coverage [email or web address]

## 2014-11-20 NOTE — Progress Notes (Signed)
INITIAL NUTRITION ASSESSMENT  Pt meets criteria for SEVERE MALNUTRITION in the context of chronic illness as evidenced by severe fat and muscle mass loss.  DOCUMENTATION CODES Per approved criteria  -Severe malnutrition in the context of chronic illness -Underweight   INTERVENTION: Provide Resource Breeze po TID, each supplement provides 250 kcal and 9 grams of protein.  Encourage adequate PO intake.  NUTRITION DIAGNOSIS: Malnutrition related to chronic illness as evidenced by severe fat and muscle mass loss.   Goal: Pt to meet >/= 90% of their estimated nutrition needs   Monitor:  PO intake, weight trends, labs, I/O's  Reason for Assessment: MST  49 y.o. 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 Lipase level was elevated at 142, and a CT scan of the ABD /Pelvis revealed Acute Pancreatitis.  Pt reports having a lack of appetite since the onset of his acute pancreatitis yesterday AM. Pt reports n/v. Prior to the onset, pt reports eating great with 2-3 full meals a day. Pt reports having weight loss with usual body weight of 180 lbs prior to his pancreatic mass. Per Epic weight records, pt with a 16.7% weight loss in 11 months. Pt is currently on a clear liquid diet and reports his nausea has been affecting his po intake. Meal completion has been < 25%. Pt is agreeable to [employer], however reports he would like to start it tomorrow. RD to order. Pt was educated to continue with supplement use at home to prevent further weight loss. Pt encouraged to eat his food at meals.  Nutrition Focused Physical Exam:  Subcutaneous Fat:  Orbital Region: N/A Upper Arm Region: Moderate depletion Thoracic and Lumbar Region: Severe depletion  Muscle:  Temple Region: N/A Clavicle Bone Region:  Severe depletion Clavicle and Acromion Bone Region: Severe depletion Scapular Bone Region: N/A Dorsal Hand: N/A Patellar Region: Moderate depletion Anterior Thigh Region: Moderate depletion Posterior Calf Region: Moderate to severe depletion  Edema: none  Labs and medication reviewed.  Height: Ht Readings from Last 1 Encounters:  11/20/14 6\' 3"  (1.905 m)    Weight: Wt Readings from Last 1 Encounters:  11/20/14 134 lb 14.4 oz (61.19 kg)    Ideal Body Weight: 196 lbs  % Ideal Body Weight: 68%  Wt Readings from Last 10 Encounters:  11/20/14 134 lb 14.4 oz (61.19 kg)  09/29/14 138 lb 12.8 oz (62.959 kg)  08/14/14 137 lb 14.4 oz (62.551 kg)  06/06/14 144 lb 4.8 oz (65.454 kg)  04/24/14 144 lb (65.318 kg)  02/03/14 147 lb 9.6 oz (66.951 kg)  12/23/13 161 lb (73.029 kg)  12/04/13 163 lb 5.8 oz (74.1 kg)  11/28/13 164 lb 6 oz (74.56 kg)  11/07/13 166 lb (75.297 kg)    Usual Body Weight: 180 lbs (per pt report)  % Usual Body Weight: 74%  BMI:  Body mass index is 16.86 kg/(m^2). Underweight  Estimated Nutritional Needs: Kcal: 2200-2400 Protein: 90-110 grams Fluid: 2.2 - 2.4 L/day  Skin: WDL  Diet Order: Diet clear liquid  EDUCATION NEEDS: -Education needs addressed  No intake or output data in the 24 hours ending 11/20/14 0945  Last BM: 2/21  Labs:   Recent Labs Lab 11/20/14 0024 11/20/14 0611  NA 135 134*  K 4.8 3.7  CL 98 101  CO2 25 25  BUN 9  7  CREATININE 0.91 0.84  CALCIUM 9.8 9.0  GLUCOSE 143* 155*    CBG (last 3)  No results for input(s): GLUCAP in the last 72 hours.  Scheduled Meds: . enoxaparin (LOVENOX) injection  40 mg Subcutaneous Q24H    Continuous Infusions: . sodium chloride 100 mL/hr (11/20/14 2831)    Past Medical History  Diagnosis Date  . Scoliosis   . Pancreatic abnormality     CT  shows mass    Past Surgical History  Procedure Laterality Date  . Lumbar disc surgery  1990's  . Finger fracture surgery Left  1995    5th digit  . Eus N/A 09/21/2013    Procedure: ESOPHAGEAL ENDOSCOPIC ULTRASOUND (EUS) RADIAL;  Surgeon: Beryle Beams, MD;  Location: [facility];  Service: Endoscopy;  Laterality: N/A;  . Fine needle aspiration N/A 09/21/2013    Procedure: FINE NEEDLE ASPIRATION (FNA) LINEAR;  Surgeon: Beryle Beams, MD;  Location: [facility];  Service: Endoscopy;  Laterality: N/A;  . Back surgery  yrs ago    lower  . Eus N/A 09/30/2013    Procedure: UPPER ENDOSCOPIC ULTRASOUND (EUS) LINEAR;  Surgeon: Beryle Beams, MD;  Location: [facility];  Service: Endoscopy;  Laterality: N/A;  . Laparoscopy N/A 12/01/2013    Procedure: LAPAROSCOPY DIAGNOSTIC PANCREATICODUODENECTOMY WITH BILIARY AND PANCREATIC STENTS;  Surgeon: Stark Klein, MD;  Location: [facility];  Service: General;  Laterality: N/A;  . Whipple procedure      Kallie Locks, MS, RD, LDN Pager # [phone] After hours/ weekend pager # [phone]

## 2014-11-20 NOTE — ED Notes (Signed)
Pt completed contrast and CT made aware.

## 2014-11-20 NOTE — ED Provider Notes (Signed)
CSN: [id number]     Arrival date & time 11/20/14  0016 History  This chart was scribed for Julianne Rice, MD by Einar Pheasant, ED Scribe. This patient was seen in room D35C/D35C and the patient's care was started at 12:38 AM.    Chief Complaint  Patient presents with  . Abdominal Pain  . Chest Pain    The history is provided by the patient and medical records. No language interpreter was used.  HPI Comments: Brent Rogers is a 49 y.o. male who was brought to the Emergency Department by EMS complaining of gradual onset persistent epigastric abdominal pain that started approximately 10AM.  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 His current abdominal pain radiates up into his chest and wraps around to his back like a band. He reports taking eight 500 mg Tylenol tablets at home to alleviate his discomfort throughout the day, but it did not work. Pt admits to associated nausea and approximately 7 episodes of emesis.  He was given 324 ASA and 4 mg of Zofran en route. Pt denies fever, neck pain, sore throat, visual disturbance, CP, hematoemesis, cough, SOB, diarrhea, urinary symptoms, back pain, HA, weakness, numbness and rash as associated symptoms.      Past Medical History  Diagnosis Date  . Scoliosis   . Pancreatic abnormality     CT  shows mass   Past Surgical History  Procedure Laterality Date  . Lumbar disc surgery  1990's  . Finger fracture surgery Left 1995    5th digit  . Eus N/A 09/21/2013    Procedure: ESOPHAGEAL ENDOSCOPIC ULTRASOUND (EUS) RADIAL;  Surgeon: Beryle Beams, MD;  Location: [facility];  Service: Endoscopy;  Laterality: N/A;  . Fine needle aspiration N/A 09/21/2013    Procedure: FINE NEEDLE ASPIRATION (FNA) LINEAR;  Surgeon: Beryle Beams, MD;  Location: [facility];  Service: Endoscopy;  Laterality: N/A;  . Back  surgery  yrs ago    lower  . Eus N/A 09/30/2013    Procedure: UPPER ENDOSCOPIC ULTRASOUND (EUS) LINEAR;  Surgeon: Beryle Beams, MD;  Location: [facility];  Service: Endoscopy;  Laterality: N/A;  . Laparoscopy N/A 12/01/2013    Procedure: LAPAROSCOPY DIAGNOSTIC PANCREATICODUODENECTOMY WITH BILIARY AND PANCREATIC STENTS;  Surgeon: Stark Klein, MD;  Location: [facility];  Service: General;  Laterality: N/A;  . Whipple procedure     Family History  Problem Relation Age of Onset  . Cancer Mother     unsure   History  Substance Use Topics  . Smoking status: Current Every Day Smoker -- 0.25 packs/day for 10 years    Types: Cigarettes  . Smokeless tobacco: Former Systems developer    Types: Snuff     Comment: 09/21/2013 "smoke < 1 pack cigarettes q 2 wks"  . Alcohol Use: Yes     Comment: 09/21/2013 "drink a beer couple times/month"    Review of Systems  Constitutional: Negative for fever and chills.  Respiratory: Negative for cough and shortness of breath.   Cardiovascular: Positive for chest pain. Negative for palpitations and leg swelling.  Gastrointestinal: Positive for nausea, vomiting, abdominal pain and diarrhea. Negative for blood in stool.  Genitourinary: Negative for dysuria, frequency, hematuria and flank pain.  Musculoskeletal: Negative for back pain, neck pain and neck stiffness.  Skin: Negative for rash and wound.  Neurological: Negative for dizziness, weakness, light-headedness,  numbness and headaches.  All other systems reviewed and are negative.     Allergies  Review of patient's allergies indicates no known allergies.  Home Medications   Prior to Admission medications   Medication Sig Start Date End Date Taking? Authorizing Provider  acetaminophen (TYLENOL) 500 MG tablet Take 500 mg by mouth every 6 (six) hours as needed for moderate pain (stomach pain).   Yes Historical Provider, MD  bismuth subsalicylate (PEPTO BISMOL) 262 MG chewable tablet Chew 524 mg by mouth 3 (three)  times daily as needed for indigestion.   Yes Historical Provider, MD  Pancrelipase, Lip-Prot-Amyl, 24000 UNITS CPEP Take 1 capsule by mouth at bedtime. With a meal   Yes Historical Provider, MD  dicyclomine (BENTYL) 20 MG tablet Take 1 tablet (20 mg total) by mouth every 6 (six) hours as needed for spasms (for abdominal cramping). Patient not taking: Reported on 11/20/2014 08/15/14   Kalman Drape, MD  ondansetron (ZOFRAN ODT) 8 MG disintegrating tablet Take 1 tablet (8 mg total) by mouth every 8 (eight) hours as needed for nausea or vomiting. Patient not taking: Reported on 11/20/2014 09/30/14   Robbie Lis, MD  oxyCODONE (OXY IR/ROXICODONE) 5 MG immediate release tablet Take 1 tablet (5 mg total) by mouth every 6 (six) hours as needed for severe pain. Patient not taking: Reported on 11/20/2014 08/15/14   Kalman Drape, MD  promethazine (PHENERGAN) 12.5 MG tablet Take 1 tablet (12.5 mg total) by mouth every 6 (six) hours as needed for nausea or vomiting. Patient not taking: Reported on 11/20/2014 06/09/14   Ripudeep Krystal Eaton, MD  traMADol (ULTRAM) 50 MG tablet Take 1 tablet (50 mg total) by mouth every 6 (six) hours as needed. Patient not taking: Reported on 11/20/2014 09/30/14   Robbie Lis, MD   BP 154/107 mmHg  Pulse 86  Temp(Src) 98 F (36.7 C) (Oral)  Resp 18  SpO2 100%  Physical Exam  Constitutional: He is oriented to person, place, and time. He appears well-developed and well-nourished. No distress.  HENT:  Head: Normocephalic and atraumatic.  Mouth/Throat: Oropharynx is clear and moist.  Eyes: EOM are normal. Pupils are equal, round, and reactive to light.  Neck: Normal range of motion. Neck supple.  Cardiovascular: Normal rate and regular rhythm.   Pulmonary/Chest: Effort normal and breath sounds normal. No respiratory distress. He has no wheezes. He has no rales. He exhibits no tenderness.  Abdominal: Soft. Bowel sounds are normal. He exhibits no distension and no mass. There is  tenderness (diffuse abdominal tenderness but especially in the epigastric region. No rebound or guarding.). There is no rebound and no guarding.  Musculoskeletal: Normal range of motion. He exhibits no edema or tenderness.  Neurological: He is alert and oriented to person, place, and time.  Skin: Skin is warm and dry. No rash noted. No erythema.  Psychiatric: He has a normal mood and affect. His behavior is normal.  Nursing note and vitals reviewed.   ED Course  Procedures (including critical care time)  DIAGNOSTIC STUDIES: Oxygen Saturation is 100% on RA, normal by my interpretation.    COORDINATION OF CARE: 12:45 AM- Pt advised of plan for treatment and pt agrees.  Labs Review Labs Reviewed  COMPREHENSIVE METABOLIC PANEL - Abnormal; Notable for the following:    Glucose, Bld 143 (*)    AST 39 (*)    Alkaline Phosphatase 130 (*)    All other components within normal limits  LIPASE, BLOOD - Abnormal; Notable  for the following:    Lipase 142 (*)    All other components within normal limits  CBC WITH DIFFERENTIAL/PLATELET  BASIC METABOLIC PANEL  CBC  I-STAT TROPOININ, ED    Imaging Review Ct Abdomen Pelvis W Contrast  11/20/2014   CLINICAL DATA:  Initial evaluation for gradual onset persistent epigastric abdominal pain.  EXAM: CT ABDOMEN AND PELVIS WITH CONTRAST  TECHNIQUE: Multidetector CT imaging of the abdomen and pelvis was performed using the standard protocol following bolus administration of intravenous contrast.  CONTRAST:  170mL OMNIPAQUE IOHEXOL 300 MG/ML  SOLN  COMPARISON:  Prior study from 09/30/2014.  FINDINGS: Visualized lung bases are clear. No pleural or pericardial effusion.  The liver demonstrates a normal contrast enhanced appearance. Gallbladder is absent. No biliary dilatation.  Spleen within normal limits.  Adrenal glands are normal.  Sequelae of prior Whipple procedure again seen. There are is prominent peripancreatic stranding with fluid density, suggesting  acute pancreatitis. Changes are primarily localized about the proximal pancreatic body at the midline. No evidence for pancreatic necrosis. No peripancreatic fluid collection.  Scattered cysts noted within the kidneys bilaterally. No nephrolithiasis, hydronephrosis, or focal enhancing renal mass.  Stomach within normal limits. No evidence for bowel obstruction. No acute inflammatory changes seen about the bowels. Appendix is normal.  Bladder and prostate within normal limits.  No free air or fluid. No pathologically enlarged intra-abdominal pelvic lymph nodes. Scattered atheromatous plaque noted within the intra-abdominal aorta which is mildly torturous. No intra-abdominal aneurysm.  IMPRESSION: 1. Findings compatible with acute pancreatitis involving the pancreatic body. No evidence for necrosis. No peripancreatic fluid collection identified. 2. No other acute intra-abdominal or pelvic process. 3. Sequelae of prior Whipple procedure.   Electronically Signed   By: Jeannine Boga M.D.   On: 11/20/2014 03:00     EKG Interpretation   Date/Time:  Monday November 20 2014 00:26:19 EST Ventricular Rate:  85 PR Interval:  145 QRS Duration: 80 QT Interval:  370 QTC Calculation: 440 R Axis:   77 Text Interpretation:  Sinus rhythm Biatrial enlargement ST elev, probable  normal early repol pattern Confirmed by Lita Mains  MD, Tylyn Derwin ([id number]) on  11/20/2014 6:43:42 AM      MDM   Final diagnoses:  Epigastric pain    I personally performed the services described in this documentation, which was scribed in my presence. The recorded information has been reviewed and is accurate.  Patient with persistent abdominal pain despite IV pain medication. Discussed with [facility] hospitalist and will admit for acute pancreatitis.   Julianne Rice, MD 11/20/14 [phone]

## 2014-11-20 NOTE — ED Notes (Signed)
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 Since the procedure patient has had issues with pancreatitis. Patient presents with LLQ abdominal pain radiating into his chest. Patient reports nausea and tenderness to palpation. Patient has received 324 ASA and 4mg  of Zofran. Patient reports taking 8-200mg  tylenol tablets at home to manage pain without relief.

## 2014-11-21 ENCOUNTER — Encounter (HOSPITAL_COMMUNITY): Payer: Self-pay | Admitting: General Practice

## 2014-11-21 DIAGNOSIS — K861 Other chronic pancreatitis: Secondary | ICD-10-CM

## 2014-11-21 DIAGNOSIS — G43A0 Cyclical vomiting, not intractable: Secondary | ICD-10-CM

## 2014-11-21 LAB — HEPATIC FUNCTION PANEL
ALT: 25 U/L (ref 0–53)
AST: 65 U/L — ABNORMAL HIGH (ref 0–37)
Albumin: 3.2 g/dL — ABNORMAL LOW (ref 3.5–5.2)
Alkaline Phosphatase: 101 U/L (ref 39–117)
Bilirubin, Direct: 0.2 mg/dL (ref 0.0–0.5)
Indirect Bilirubin: 0.7 mg/dL (ref 0.3–0.9)
Total Bilirubin: 0.9 mg/dL (ref 0.3–1.2)
Total Protein: 5.5 g/dL — ABNORMAL LOW (ref 6.0–8.3)

## 2014-11-21 LAB — LIPASE, BLOOD: Lipase: 43 U/L (ref 11–59)

## 2014-11-21 MED ORDER — DICYCLOMINE HCL 20 MG PO TABS
20.0000 mg | ORAL_TABLET | Freq: Four times a day (QID) | ORAL | Status: DC | PRN
  Administered 2014-11-22 – 2014-11-23 (×3): 20 mg via ORAL
  Filled 2014-11-21 (×3): qty 1

## 2014-11-21 MED ORDER — SENNOSIDES-DOCUSATE SODIUM 8.6-50 MG PO TABS
2.0000 | ORAL_TABLET | Freq: Every day | ORAL | Status: DC
  Administered 2014-11-21 – 2014-11-22 (×2): 2 via ORAL
  Filled 2014-11-21 (×2): qty 2

## 2014-11-21 MED ORDER — PANCRELIPASE (LIP-PROT-AMYL) 12000-38000 UNITS PO CPEP
24000.0000 [IU] | ORAL_CAPSULE | Freq: Every day | ORAL | Status: DC
  Administered 2014-11-21 – 2014-11-22 (×2): 24000 [IU] via ORAL
  Filled 2014-11-21 (×4): qty 2

## 2014-11-21 NOTE — Progress Notes (Signed)
[facility] Progress Note   Brent Rogers DOB: [birth year] DOA: 11/20/2014 PCP: No PCP Per Patient  Brief narrative: Brent Rogers is a 49 y.o. 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    Subjective: Abdominal pain is better than it was yesterday but he still stating it is 8 out of 10 even with medication. Nausea is improving and he is able to sip on clears today.  Assessment/Plan: Principal Problem:   Acute pancreatitis - etiology uncertain - today his pain is more in the left abdomen rather than mid abdomen-cont pain control- I did increase the Dilaudid yesterday-continue oxycodone as well -Although his lipase has normalized, will cont sips of clears for now as he does not feel that he can tolerate full liquids at this time  - cont to hydrate  Active Problems:  Chronic pancreatitis -Continue Creon    Protein-calorie malnutrition, severe - will order supplements once able to eat   Code Status: full code Family Communication:  Disposition Plan: home when stable DVT prophylaxis: Lovenox  Consultants:   Procedures:   Antibiotics: Anti-infectives    None         Objective: Filed Weights   11/20/14 0545  Weight: 61.19 kg (134 lb 14.4 oz)    Intake/Output Summary (Last 24 hours) at 11/21/14 1405 Last data filed at 11/21/14 3154  Gross per 24 hour  Intake    360 ml  Output    850 ml  Net   -490 ml     Vitals Filed Vitals:   11/20/14 0545 11/20/14 1241 11/20/14 2200 11/21/14 0600  BP: 157/104 139/103 131/84 119/69  Pulse: 85 71 98 92  Temp: 97.8 F (36.6 C) 97.7 F (36.5 C) 98.9 F (37.2 C) 99.8 F (37.7 C)  TempSrc: Oral     Resp: 18 16 16 16   Height: 6\' 3"  (1.905 m)     Weight: 61.19 kg (134 lb 14.4 oz)     SpO2: 100% 100% 100% 99%    Exam: General: AAO x3, No acute respiratory distress Lungs: Clear to auscultation  bilaterally without wheezes or crackles Cardiovascular: Regular rate and rhythm without murmur gallop or rub normal S1 and S2 Abdomen: Tenderness in left side of the abdomen, nondistended, soft, bowel sounds positive, no rebound, no ascites, no appreciable mass Extremities: No significant cyanosis, clubbing, or edema bilateral lower extremities  Data Reviewed: Basic Metabolic Panel:  Recent Labs Lab 11/20/14 0024 11/20/14 0611  NA 135 134*  K 4.8 3.7  CL 98 101  CO2 25 25  GLUCOSE 143* 155*  BUN 9 7  CREATININE 0.91 0.84  CALCIUM 9.8 9.0   Liver Function Tests:  Recent Labs Lab 11/20/14 0024 11/21/14 0625  AST 39* 65*  ALT 22 25  ALKPHOS 130* 101  BILITOT 1.2 0.9  PROT 6.9 5.5*  ALBUMIN 4.3 3.2*    Recent Labs Lab 11/20/14 0034 11/21/14 0625  LIPASE 142* 43   No results for input(s): AMMONIA in the last 168 hours. CBC:  Recent Labs Lab 11/20/14 0024 11/20/14 0611  WBC 9.5 11.7*  NEUTROABS 7.2  --   HGB 15.8 14.1  HCT 46.2 42.5  MCV 86.5 87.8  PLT 204 181   Cardiac Enzymes: No results for input(s): CKTOTAL, CKMB, CKMBINDEX, TROPONINI in the last 168 hours. BNP (last 3 results) No results for input(s): BNP in the last 8760 hours.  ProBNP (last 3  results) No results for input(s): PROBNP in the last 8760 hours.  CBG: No results for input(s): GLUCAP in the last 168 hours.  No results found for this or any previous visit (from the past 240 hour(s)).   Studies:  Recent x-ray studies have been reviewed in detail by the Attending Physician  Scheduled Meds:  Scheduled Meds: . enoxaparin (LOVENOX) injection  40 mg Subcutaneous Q24H  . feeding supplement (RESOURCE BREEZE)  1 Container Oral TID BM  . Influenza vac split quadrivalent PF  0.5 mL Intramuscular Tomorrow-1000  . senna-docusate  2 tablet Oral Daily   Continuous Infusions: . sodium chloride 100 mL/hr at 11/21/14 1572    Time spent on care of this patient: 66min   Debbe Odea,  MD 11/21/2014, 2:05 PM  LOS: 1 day   [facility] Office  [phone] Pager - Text Page per [email or web address]  If 7PM-7AM, please contact night-coverage [email or web address]

## 2014-11-22 LAB — HEPATIC FUNCTION PANEL
ALT: 26 U/L (ref 0–53)
AST: 43 U/L — ABNORMAL HIGH (ref 0–37)
Albumin: 3 g/dL — ABNORMAL LOW (ref 3.5–5.2)
Alkaline Phosphatase: 89 U/L (ref 39–117)
Bilirubin, Direct: 0.1 mg/dL (ref 0.0–0.5)
Indirect Bilirubin: 0.6 mg/dL (ref 0.3–0.9)
Total Bilirubin: 0.7 mg/dL (ref 0.3–1.2)
Total Protein: 5.5 g/dL — ABNORMAL LOW (ref 6.0–8.3)

## 2014-11-22 LAB — LIPASE, BLOOD: Lipase: 22 U/L (ref 11–59)

## 2014-11-22 NOTE — Progress Notes (Signed)
[facility] Progress Note   Brent Rogers LOV:[id number] DOB: 1966/07/13 DOA: 11/20/2014 PCP: No PCP Per Patient  Brief narrative: Brent Rogers is a 49 y.o. 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   Subjective: Abdominal pain is better but becomes exacerbated with solid food.   Assessment/Plan: Principal Problem:   Acute pancreatitis - etiology uncertain - today his pain is more in the left abdomen rather than mid abdomen-cont pain control- I did increase the Dilaudid yesterday-continue oxycodone as well -advance diet to solids and follow overnight - cont to hydrate  Active Problems:  Chronic pancreatitis -Continue Creon    Protein-calorie malnutrition, severe - will order supplements not that he is eating   Code Status: full code Family Communication:  Disposition Plan: home when stable DVT prophylaxis: Lovenox  Consultants:   Procedures:   Antibiotics: Anti-infectives    None         Objective: Filed Weights   11/20/14 0545  Weight: 61.19 kg (134 lb 14.4 oz)    Intake/Output Summary (Last 24 hours) at 11/22/14 1519 Last data filed at 11/22/14 1300  Gross per 24 hour  Intake   1900 ml  Output   3300 ml  Net  -1400 ml     Vitals Filed Vitals:   11/21/14 2056 11/22/14 0016 11/22/14 0516 11/22/14 1300  BP: 131/97 143/92 138/95 133/90  Pulse: 63 64 71 74  Temp: 98 F (36.7 C) 98.3 F (36.8 C) 98.2 F (36.8 C) 99 F (37.2 C)  TempSrc: Oral Oral Oral   Resp: 16 16 16 16   Height:      Weight:      SpO2: 100% 100% 100% 100%    Exam: General: AAO x3, No acute respiratory distress Lungs: Clear to auscultation bilaterally without wheezes or crackles Cardiovascular: Regular rate and rhythm without murmur gallop or rub normal S1 and S2 Abdomen: Tenderness in left side of the abdomen, nondistended, soft, bowel sounds positive, no rebound, no  ascites, no appreciable mass Extremities: No significant cyanosis, clubbing, or edema bilateral lower extremities  Data Reviewed: Basic Metabolic Panel:  Recent Labs Lab 11/20/14 0024 11/20/14 0611  NA 135 134*  K 4.8 3.7  CL 98 101  CO2 25 25  GLUCOSE 143* 155*  BUN 9 7  CREATININE 0.91 0.84  CALCIUM 9.8 9.0   Liver Function Tests:  Recent Labs Lab 11/20/14 0024 11/21/14 0625 11/22/14 0531  AST 39* 65* 43*  ALT 22 25 26   ALKPHOS 130* 101 89  BILITOT 1.2 0.9 0.7  PROT 6.9 5.5* 5.5*  ALBUMIN 4.3 3.2* 3.0*    Recent Labs Lab 11/20/14 0034 11/21/14 0625 11/22/14 0531  LIPASE 142* 43 22   No results for input(s): AMMONIA in the last 168 hours. CBC:  Recent Labs Lab 11/20/14 0024 11/20/14 0611  WBC 9.5 11.7*  NEUTROABS 7.2  --   HGB 15.8 14.1  HCT 46.2 42.5  MCV 86.5 87.8  PLT 204 181   Cardiac Enzymes: No results for input(s): CKTOTAL, CKMB, CKMBINDEX, TROPONINI in the last 168 hours. BNP (last 3 results) No results for input(s): BNP in the last 8760 hours.  ProBNP (last 3 results) No results for input(s): PROBNP in the last 8760 hours.  CBG: No results for input(s): GLUCAP in the last 168 hours.  No results found for this or any previous visit (from the past 240 hour(s)).   Studies:  Recent x-ray studies have been reviewed in detail by the Attending Physician  Scheduled Meds:  Scheduled Meds: . enoxaparin (LOVENOX) injection  40 mg Subcutaneous Q24H  . feeding supplement (RESOURCE BREEZE)  1 Container Oral TID BM  . lipase/protease/amylase  24,000 Units Oral QHS  . senna-docusate  2 tablet Oral Daily   Continuous Infusions: . sodium chloride 100 mL/hr at 11/22/14 0146    Time spent on care of this patient:   Seraj Dunnam, MD 11/22/2014, 3:19 PM  LOS: 2 days   [facility] Office  [phone] Pager - Text Page per [email or web address]  If 7PM-7AM, please contact night-coverage [email or web address]

## 2014-11-22 NOTE — Plan of Care (Signed)
Problem: Phase II Progression Outcomes Goal: IV changed to normal saline lock Outcome: Not Progressing Continue w/ clear liquid diet, NS IVF

## 2014-11-22 NOTE — Plan of Care (Signed)
Problem: Phase III Progression Outcomes Goal: IV/normal saline lock discontinued Outcome: Not Progressing Placed back on IVF d/t diarrhea episodes today, and continuing per pt reports

## 2014-11-23 LAB — HEPATIC FUNCTION PANEL
ALT: 25 U/L (ref 0–53)
AST: 32 U/L (ref 0–37)
Albumin: 2.8 g/dL — ABNORMAL LOW (ref 3.5–5.2)
Alkaline Phosphatase: 90 U/L (ref 39–117)
Bilirubin, Direct: 0.1 mg/dL (ref 0.0–0.5)
Indirect Bilirubin: 0.2 mg/dL — ABNORMAL LOW (ref 0.3–0.9)
Total Bilirubin: 0.3 mg/dL (ref 0.3–1.2)
Total Protein: 5.2 g/dL — ABNORMAL LOW (ref 6.0–8.3)

## 2014-11-23 LAB — LIPASE, BLOOD: Lipase: 21 U/L (ref 11–59)

## 2014-11-23 MED ORDER — PANCRELIPASE (LIP-PROT-AMYL) 24000-76000 UNITS PO CPEP: 1.0000 | ORAL_CAPSULE | Freq: Every day | ORAL | Status: DC

## 2014-11-23 MED ORDER — LOPERAMIDE HCL 2 MG PO CAPS
4.0000 mg | ORAL_CAPSULE | Freq: Once | ORAL | Status: AC
  Administered 2014-11-23: 4 mg via ORAL
  Filled 2014-11-23: qty 2

## 2014-11-23 NOTE — Progress Notes (Signed)
[name] discharged home per MD order. Discharge instructions reviewed and discussed with patient. All questions and concerns answered. Copy of instructions and scripts given to patient. IV removed.  Patient escorted to car by nursing student in a wheelchair. No distress noted upon discharge.   Nicki Reaper [place] 11/23/2014 10:50 AM

## 2014-11-23 NOTE — Discharge Summary (Addendum)
Physician Discharge Summary  Brent Rogers MEQ:[id number] DOB: August 12, 1966 DOA: 11/20/2014  PCP: No PCP Per Patient I have referred him to the health and wellness clinic  Admit date: 11/20/2014 Discharge date: 11/23/2014  Time spent: 50 minutes   Discharge Condition: stable Diet recommendation: heart healthy  Discharge Diagnoses:  Principal Problem:   Acute pancreatitis Active Problems:   Abdominal pain, epigastric   Nausea and vomiting   Protein-calorie malnutrition, severe   History of present illness:  Brent Rogers is a 49 y.o. 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 He reports having bilious emesis. He denies any fevers or chills. In the ED, his lipase level was elevated at 142, and a CT scan of the ABD /Pelvis revealed Acute Pancreatitis. He was referred for medical admission.   Hospital Course:  Principal Problem:  Acute pancreatitis - etiology uncertain - he does not drink alcohol- CT report mentioned below- he does not have a gallbladde - pain has resolved and lipase normalized with conservative management -advanced diet to solids - he is tolerating it well and is ready to d/c home today  Active Problems:  Chronic pancreatitis -Continue Creon   Protein-calorie malnutrition, severe - encouraged to increase PO intake at home  Discharge Exam: Filed Weights   11/20/14 0545  Weight: 61.19 kg (134 lb 14.4 oz)   Filed Vitals:   11/23/14 0952  BP: 136/92  Pulse: 55  Temp:   Resp: 12    General: AAO x 3, no distress Cardiovascular: RRR, no murmurs  Respiratory: clear to auscultation bilaterally GI: soft, non-tender, non-distended, bowel sound positive  Discharge Instructions You were cared for by a hospitalist during your hospital stay. If you have any questions about your discharge  medications or the care you received while you were in the hospital after you are discharged, you can call the unit and asked to speak with the hospitalist on call if the hospitalist that took care of you is not available. Once you are discharged, your primary care physician will handle any further medical issues. Please note that NO REFILLS for any discharge medications will be authorized once you are discharged, as it is imperative that you return to your primary care physician (or establish a relationship with a primary care physician if you do not have one) for your aftercare needs so that they can reassess your need for medications and monitor your lab values.  Discharge Instructions    Diet - low sodium heart healthy    Complete by:  As directed      Increase activity slowly    Complete by:  As directed             Medication List    STOP taking these medications        dicyclomine 20 MG tablet  Commonly known as:  BENTYL     oxyCODONE 5 MG immediate release tablet  Commonly known as:  Oxy IR/ROXICODONE     promethazine 12.5 MG tablet  Commonly known as:  PHENERGAN     traMADol 50 MG tablet  Commonly known as:  ULTRAM      TAKE these medications        acetaminophen 500 MG tablet  Commonly known as:  TYLENOL  Take 500 mg by mouth every 6 (six) hours as needed for moderate pain (  stomach pain).     bismuth subsalicylate 166 MG chewable tablet  Commonly known as:  PEPTO BISMOL  Chew 524 mg by mouth 3 (three) times daily as needed for indigestion.     ondansetron 8 MG disintegrating tablet  Commonly known as:  ZOFRAN ODT  Take 1 tablet (8 mg total) by mouth every 8 (eight) hours as needed for nausea or vomiting.     Pancrelipase (Lip-Prot-Amyl) 24000 UNITS Cpep  Take 1 capsule (24,000 Units total) by mouth at bedtime. With a meal       No Known Allergies     Follow-up Information    Follow up with [place] On 11/27/2014.   Why:   9:30am Bring all discharge paperwork to your appointment.   Contact information:   [address] [place]  [id number] [phone]       The results of significant diagnostics from this hospitalization (including imaging, microbiology, ancillary and laboratory) are listed below for reference.    Significant Diagnostic Studies: Ct Abdomen Pelvis W Contrast  11/20/2014   CLINICAL DATA:  Initial evaluation for gradual onset persistent epigastric abdominal pain.  EXAM: CT ABDOMEN AND PELVIS WITH CONTRAST  TECHNIQUE: Multidetector CT imaging of the abdomen and pelvis was performed using the standard protocol following bolus administration of intravenous contrast.  CONTRAST:  137mL OMNIPAQUE IOHEXOL 300 MG/ML  SOLN  COMPARISON:  Prior study from 09/30/2014.  FINDINGS: Visualized lung bases are clear. No pleural or pericardial effusion.  The liver demonstrates a normal contrast enhanced appearance. Gallbladder is absent. No biliary dilatation.  Spleen within normal limits.  Adrenal glands are normal.  Sequelae of prior Whipple procedure again seen. There are is prominent peripancreatic stranding with fluid density, suggesting acute pancreatitis. Changes are primarily localized about the proximal pancreatic body at the midline. No evidence for pancreatic necrosis. No peripancreatic fluid collection.  Scattered cysts noted within the kidneys bilaterally. No nephrolithiasis, hydronephrosis, or focal enhancing renal mass.  Stomach within normal limits. No evidence for bowel obstruction. No acute inflammatory changes seen about the bowels. Appendix is normal.  Bladder and prostate within normal limits.  No free air or fluid. No pathologically enlarged intra-abdominal pelvic lymph nodes. Scattered atheromatous plaque noted within the intra-abdominal aorta which is mildly torturous. No intra-abdominal aneurysm.  IMPRESSION: 1. Findings compatible with acute pancreatitis involving the pancreatic  body. No evidence for necrosis. No peripancreatic fluid collection identified. 2. No other acute intra-abdominal or pelvic process. 3. Sequelae of prior Whipple procedure.   Electronically Signed   By: Jeannine Boga M.D.   On: 11/20/2014 03:00    Microbiology: No results found for this or any previous visit (from the past 240 hour(s)).   Labs: Basic Metabolic Panel:  Recent Labs Lab 11/20/14 0024 11/20/14 0611  NA 135 134*  K 4.8 3.7  CL 98 101  CO2 25 25  GLUCOSE 143* 155*  BUN 9 7  CREATININE 0.91 0.84  CALCIUM 9.8 9.0   Liver Function Tests:  Recent Labs Lab 11/20/14 0024 11/21/14 0625 11/22/14 0531 11/23/14 0528  AST 39* 65* 43* 32  ALT 22 25 26 25   ALKPHOS 130* 101 89 90  BILITOT 1.2 0.9 0.7 0.3  PROT 6.9 5.5* 5.5* 5.2*  ALBUMIN 4.3 3.2* 3.0* 2.8*    Recent Labs Lab 11/20/14 0034 11/21/14 0625 11/22/14 0531 11/23/14 0528  LIPASE 142* 43 22 21   No results for input(s): AMMONIA in the last 168 hours. CBC:  Recent Labs Lab 11/20/14 0024 11/20/14 0611  WBC 9.5 11.7*  NEUTROABS 7.2  --   HGB 15.8 14.1  HCT 46.2 42.5  MCV 86.5 87.8  PLT 204 181   Cardiac Enzymes: No results for input(s): CKTOTAL, CKMB, CKMBINDEX, TROPONINI in the last 168 hours. BNP: BNP (last 3 results) No results for input(s): BNP in the last 8760 hours.  ProBNP (last 3 results) No results for input(s): PROBNP in the last 8760 hours.  CBG: No results for input(s): GLUCAP in the last 168 hours.     SignedDebbe Odea, MD [facility] 11/23/2014, 3:31 PM

## 2014-11-27 ENCOUNTER — Ambulatory Visit: Payer: Self-pay | Attending: Physician Assistant | Admitting: Family Medicine

## 2014-11-27 VITALS — BP 134/92 | HR 72 | Temp 98.4°F | Resp 16 | Ht 75.0 in | Wt 156.0 lb

## 2014-11-27 DIAGNOSIS — K861 Other chronic pancreatitis: Secondary | ICD-10-CM | POA: Insufficient documentation

## 2014-11-27 MED ORDER — PANCRELIPASE (LIP-PROT-AMYL) 24000-76000 UNITS PO CPEP: 1.0000 | ORAL_CAPSULE | Freq: Every day | ORAL | Status: AC

## 2014-11-27 NOTE — Progress Notes (Signed)
ischarge Diagnoses:  Principal Problem:  Acute pancreatitis Active Problems:  Abdominal pain, epigastric  Nausea and vomiting  Protein-calorie malnutrition, severe   History of present illness:  Brent Rogers is a 49 y.o. 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 He reports having bilious emesis. He denies any fevers or chills. In the ED, his lipase level was elevated at 142, and a CT scan of the ABD /Pelvis revealed Acute Pancreatitis. He was referred for medical admission.   Hospital Course:  Principal Problem:  Acute pancreatitis - etiology uncertain - he does not drink alcohol- CT report mentioned below- he does not have a gallbladde - pain has resolved and lipase normalized with conservative management

## 2014-11-27 NOTE — Assessment & Plan Note (Signed)
OBJ:  Patient is alert, oriented, appropriate, in no distress. Neck is supple FROM w/o adenopathy or tenderness. Lungs are clear to auscultation. HS are regular w/o m,g,r

## 2014-12-26 ENCOUNTER — Ambulatory Visit: Payer: Self-pay | Attending: Internal Medicine | Admitting: Internal Medicine

## 2014-12-26 ENCOUNTER — Encounter: Payer: Self-pay | Admitting: Internal Medicine

## 2014-12-26 VITALS — BP 140/90 | HR 77 | Temp 98.9°F | Resp 16 | Wt 158.7 lb

## 2014-12-26 DIAGNOSIS — K861 Other chronic pancreatitis: Secondary | ICD-10-CM

## 2014-12-26 DIAGNOSIS — F17210 Nicotine dependence, cigarettes, uncomplicated: Secondary | ICD-10-CM | POA: Insufficient documentation

## 2014-12-26 DIAGNOSIS — F17200 Nicotine dependence, unspecified, uncomplicated: Secondary | ICD-10-CM

## 2014-12-26 DIAGNOSIS — R030 Elevated blood-pressure reading, without diagnosis of hypertension: Secondary | ICD-10-CM | POA: Insufficient documentation

## 2014-12-26 DIAGNOSIS — IMO0001 Reserved for inherently not codable concepts without codable children: Secondary | ICD-10-CM

## 2014-12-26 DIAGNOSIS — Z720 Tobacco use: Secondary | ICD-10-CM

## 2014-12-26 LAB — CBC WITH DIFFERENTIAL/PLATELET
Basophils Absolute: 0.1 10*3/uL (ref 0.0–0.1)
Basophils Relative: 1 % (ref 0–1)
Eosinophils Absolute: 0.1 10*3/uL (ref 0.0–0.7)
Eosinophils Relative: 1 % (ref 0–5)
HCT: 43.6 % (ref 39.0–52.0)
Hemoglobin: 14.5 g/dL (ref 13.0–17.0)
Lymphocytes Relative: 27 % (ref 12–46)
Lymphs Abs: 2.2 10*3/uL (ref 0.7–4.0)
MCH: 28.7 pg (ref 26.0–34.0)
MCHC: 33.3 g/dL (ref 30.0–36.0)
MCV: 86.3 fL (ref 78.0–100.0)
MPV: 10.9 fL (ref 8.6–12.4)
Monocytes Absolute: 0.4 10*3/uL (ref 0.1–1.0)
Monocytes Relative: 5 % (ref 3–12)
Neutro Abs: 5.3 10*3/uL (ref 1.7–7.7)
Neutrophils Relative %: 66 % (ref 43–77)
Platelets: 176 10*3/uL (ref 150–400)
RBC: 5.05 MIL/uL (ref 4.22–5.81)
RDW: 14 % (ref 11.5–15.5)
WBC: 8 10*3/uL (ref 4.0–10.5)

## 2014-12-26 LAB — COMPLETE METABOLIC PANEL WITH GFR
ALT: 61 U/L — ABNORMAL HIGH (ref 0–53)
AST: 52 U/L — ABNORMAL HIGH (ref 0–37)
Albumin: 4 g/dL (ref 3.5–5.2)
Alkaline Phosphatase: 156 U/L — ABNORMAL HIGH (ref 39–117)
BUN: 6 mg/dL (ref 6–23)
CO2: 29 mEq/L (ref 19–32)
Calcium: 9.8 mg/dL (ref 8.4–10.5)
Chloride: 107 mEq/L (ref 96–112)
Creat: 0.77 mg/dL (ref 0.50–1.35)
GFR, Est African American: >89 mL/min
GFR, Est Non African American: >89 mL/min
Glucose, Bld: 93 mg/dL (ref 70–99)
Potassium: 4.7 mEq/L (ref 3.5–5.3)
Sodium: 142 mEq/L (ref 135–145)
Total Bilirubin: 0.5 mg/dL (ref 0.2–1.2)
Total Protein: 6.6 g/dL (ref 6.0–8.3)

## 2014-12-26 LAB — HEMOGLOBIN A1C
Hgb A1c MFr Bld: 6 % — ABNORMAL HIGH (ref <5.7)
Mean Plasma Glucose: 126 mg/dL — ABNORMAL HIGH (ref <117)

## 2014-12-26 NOTE — Patient Instructions (Signed)
DASH Eating Plan °DASH stands for "Dietary Approaches to Stop Hypertension." The DASH eating plan is a healthy eating plan that has been shown to reduce high blood pressure (hypertension). Additional health benefits may include reducing the risk of type 2 diabetes mellitus, heart disease, and stroke. The DASH eating plan may also help with weight loss. °WHAT DO I NEED TO KNOW ABOUT THE DASH EATING PLAN? °For the DASH eating plan, you will follow these general guidelines: °· Choose foods with a percent daily value for sodium of less than 5% (as listed on the food label). °· Use salt-free seasonings or herbs instead of table salt or sea salt. °· Check with your health care provider or pharmacist before using salt substitutes. °· Eat lower-sodium products, often labeled as "lower sodium" or "no salt added." °· Eat fresh foods. °· Eat more vegetables, fruits, and low-fat dairy products. °· Choose whole grains. Look for the word "whole" as the first word in the ingredient list. °· Choose fish and skinless chicken or turkey more often than red meat. Limit fish, poultry, and meat to 6 oz (170 g) each day. °· Limit sweets, desserts, sugars, and sugary drinks. °· Choose heart-healthy fats. °· Limit cheese to 1 oz (28 g) per day. °· Eat more home-cooked food and less restaurant, buffet, and fast food. °· Limit fried foods. °· Cook foods using methods other than frying. °· Limit canned vegetables. If you do use them, rinse them well to decrease the sodium. °· When eating at a restaurant, ask that your food be prepared with less salt, or no salt if possible. °WHAT FOODS CAN I EAT? °Seek help from a dietitian for individual calorie needs. °Grains °Whole grain or whole wheat bread. Brown rice. Whole grain or whole wheat pasta. Quinoa, bulgur, and whole grain cereals. Low-sodium cereals. Corn or whole wheat flour tortillas. Whole grain cornbread. Whole grain crackers. Low-sodium crackers. °Vegetables °Fresh or frozen vegetables  (raw, steamed, roasted, or grilled). Low-sodium or reduced-sodium tomato and vegetable juices. Low-sodium or reduced-sodium tomato sauce and paste. Low-sodium or reduced-sodium canned vegetables.  °Fruits °All fresh, canned (in natural juice), or frozen fruits. °Meat and Other Protein Products °Ground beef (85% or leaner), grass-fed beef, or beef trimmed of fat. Skinless chicken or turkey. Ground chicken or turkey. Pork trimmed of fat. All fish and seafood. Eggs. Dried beans, peas, or lentils. Unsalted nuts and seeds. Unsalted canned beans. °Dairy °Low-fat dairy products, such as skim or 1% milk, 2% or reduced-fat cheeses, low-fat ricotta or cottage cheese, or plain low-fat yogurt. Low-sodium or reduced-sodium cheeses. °Fats and Oils °Tub margarines without trans fats. Light or reduced-fat mayonnaise and salad dressings (reduced sodium). Avocado. Safflower, olive, or canola oils. Natural peanut or almond butter. °Other °Unsalted popcorn and pretzels. °The items listed above may not be a complete list of recommended foods or beverages. Contact your dietitian for more options. °WHAT FOODS ARE NOT RECOMMENDED? °Grains °White bread. White pasta. White rice. Refined cornbread. Bagels and croissants. Crackers that contain trans fat. °Vegetables °Creamed or fried vegetables. Vegetables in a cheese sauce. Regular canned vegetables. Regular canned tomato sauce and paste. Regular tomato and vegetable juices. °Fruits °Dried fruits. Canned fruit in light or heavy syrup. Fruit juice. °Meat and Other Protein Products °Fatty cuts of meat. Ribs, chicken wings, bacon, sausage, bologna, salami, chitterlings, fatback, hot dogs, bratwurst, and packaged luncheon meats. Salted nuts and seeds. Canned beans with salt. °Dairy °Whole or 2% milk, cream, half-and-half, and cream cheese. Whole-fat or sweetened yogurt. Full-fat   cheeses or blue cheese. Nondairy creamers and whipped toppings. Processed cheese, cheese spreads, or cheese  curds. °Condiments °Onion and garlic salt, seasoned salt, table salt, and sea salt. Canned and packaged gravies. Worcestershire sauce. Tartar sauce. Barbecue sauce. Teriyaki sauce. Soy sauce, including reduced sodium. Steak sauce. Fish sauce. Oyster sauce. Cocktail sauce. Horseradish. Ketchup and mustard. Meat flavorings and tenderizers. Bouillon cubes. Hot sauce. Tabasco sauce. Marinades. Taco seasonings. Relishes. °Fats and Oils °Butter, stick margarine, lard, shortening, ghee, and bacon fat. Coconut, palm kernel, or palm oils. Regular salad dressings. °Other °Pickles and olives. Salted popcorn and pretzels. °The items listed above may not be a complete list of foods and beverages to avoid. Contact your dietitian for more information. °WHERE CAN I FIND MORE INFORMATION? °[employer], [employer], [employer] Institute: www.nhlbi.nih.gov/health/health-topics/topics/dash/ °Document Released: 09/04/2011 Document Revised: 01/30/2014 Document Reviewed: 07/20/2013 °ExitCare® Patient Information ©2015 [employer]. This information is not intended to replace advice given to you by your health care provider. Make sure you discuss any questions you have with your health care provider. ° °

## 2014-12-26 NOTE — Progress Notes (Signed)
MRN: [id number] Name: Brent Rogers  Sex: male Age: 49 y.o. DOB: Apr 17, 1966  Allergies: Review of patient's allergies indicates no known allergies.  Chief Complaint  Patient presents with  . Follow-up    HPI: Patient is 49 y.o. male who has history of chronic pancreatitis, history of pancreatic mass status post Whipple procedure in 2015 as per patient at that time he was started on Creon which she stopped taking due to insurance issues last month he was hospitalized with epigastric pain nausea vomiting, was treated for acute pancreatitis, subsequently improved and was resumed back on Creon, patient drinks alcohol occasionally and also smoking cigarettes, have counseled patient to quit smoking and avoid alcohol which can worsen the symptoms of pancreatitis, currently patient denies any acute symptoms denies any nausea vomiting.today's blood pressure is borderline elevated, denies any headache dizziness chest and shortness of breath.  Past Medical History  Diagnosis Date  . Scoliosis   . Pancreatic abnormality     CT  shows mass  . Chronic pancreatitis     S/P Whipple 05/2014/notes 11/20/2014    Past Surgical History  Procedure Laterality Date  . Lumbar disc surgery  1990's  . Finger fracture surgery Left 1995    5th digit  . Eus N/A 09/21/2013    Procedure: ESOPHAGEAL ENDOSCOPIC ULTRASOUND (EUS) RADIAL;  Surgeon: Beryle Beams, MD;  Location: [facility];  Service: Endoscopy;  Laterality: N/A;  . Fine needle aspiration N/A 09/21/2013    Procedure: FINE NEEDLE ASPIRATION (FNA) LINEAR;  Surgeon: Beryle Beams, MD;  Location: [facility];  Service: Endoscopy;  Laterality: N/A;  . Eus N/A 09/30/2013    Procedure: UPPER ENDOSCOPIC ULTRASOUND (EUS) LINEAR;  Surgeon: Beryle Beams, MD;  Location: [facility];  Service: Endoscopy;  Laterality: N/A;  . Laparoscopy N/A 12/01/2013    Procedure: LAPAROSCOPY DIAGNOSTIC PANCREATICODUODENECTOMY WITH BILIARY AND PANCREATIC STENTS;  Surgeon:  Stark Klein, MD;  Location: [facility];  Service: General;  Laterality: N/A;  . Whipple procedure    . Fracture surgery    . Back surgery        Medication List       This list is accurate as of: 12/26/14 10:54 AM.  Always use your most recent med list.               acetaminophen 500 MG tablet  Commonly known as:  TYLENOL  Take 500 mg by mouth every 6 (six) hours as needed for moderate pain (stomach pain).     bismuth subsalicylate 101 MG chewable tablet  Commonly known as:  PEPTO BISMOL  Chew 524 mg by mouth 3 (three) times daily as needed for indigestion.     ondansetron 8 MG disintegrating tablet  Commonly known as:  ZOFRAN ODT  Take 1 tablet (8 mg total) by mouth every 8 (eight) hours as needed for nausea or vomiting.     Pancrelipase (Lip-Prot-Amyl) 24000 UNITS Cpep  Take 1 capsule (24,000 Units total) by mouth at bedtime. With a meal        No orders of the defined types were placed in this encounter.    Immunization History  Administered Date(s) Administered  . Influenza,inj,Quad PF,36+ Mos 11/22/2014  . Influenza-Unspecified 05/30/2013  . Pneumococcal Polysaccharide-23 09/22/2013    Family History  Problem Relation Age of Onset  . Cancer Mother     unsure  . Hypertension Father     History  Substance Use Topics  . Smoking status: Current Every Day  Smoker -- 0.25 packs/day for 31 years    Types: Cigarettes  . Smokeless tobacco: Former Systems developer    Types: Snuff  . Alcohol Use: 1.2 oz/week    2 Cans of beer per week    Review of Systems   As noted in HPI  Filed Vitals:   12/26/14 1045  BP: 140/90  Pulse:   Temp:   Resp:     Physical Exam  Physical Exam  Constitutional: No distress.  Eyes: EOM are normal. Pupils are equal, round, and reactive to light.  Cardiovascular: Normal rate and regular rhythm.   Pulmonary/Chest: Breath sounds normal. No respiratory distress. He has no wheezes. He has no rales.  Abdominal: There is no tenderness.  There is no rebound and no guarding.  Old midline surgical scar on abdomen  Musculoskeletal: He exhibits no edema.    CBC    Component Value Date/Time   WBC 11.7* 11/20/2014 0611   RBC 4.84 11/20/2014 0611   HGB 14.1 11/20/2014 0611   HCT 42.5 11/20/2014 0611   PLT 181 11/20/2014 0611   MCV 87.8 11/20/2014 0611   LYMPHSABS 1.3 11/20/2014 0024   MONOABS 0.9 11/20/2014 0024   EOSABS 0.0 11/20/2014 0024   BASOSABS 0.0 11/20/2014 0024    CMP     Component Value Date/Time   NA 134* 11/20/2014 0611   K 3.7 11/20/2014 0611   CL 101 11/20/2014 0611   CO2 25 11/20/2014 0611   GLUCOSE 155* 11/20/2014 0611   BUN 7 11/20/2014 0611   CREATININE 0.84 11/20/2014 0611   CALCIUM 9.0 11/20/2014 0611   PROT 5.2* 11/23/2014 0528   ALBUMIN 2.8* 11/23/2014 0528   AST 32 11/23/2014 0528   ALT 25 11/23/2014 0528   ALKPHOS 90 11/23/2014 0528   BILITOT 0.3 11/23/2014 0528   GFRNONAA >90 11/20/2014 0611   GFRAA >90 11/20/2014 0611    Lab Results  Component Value Date/Time   CHOL 121 09/30/2014 05:33 AM    No components found for: HGA1C  Lab Results  Component Value Date/Time   AST 32 11/23/2014 05:28 AM    Assessment and Plan  Chronic pancreatitis, unspecified pancreatitis type Continue with Creon, counseled patient to avoid alcohol.  Smoking Constipation to quit smoking.  Elevated BP - Plan: Advised patient for DASH diet, advise patient to quit smoking, ordered baseline blood work CBC with Differential/Platelet, COMPLETE METABOLIC PANEL WITH GFR, Vit D  25 hydroxy (rtn osteoporosis monitoring), Hemoglobin A1c   Health Maintenance  -Vaccinations:  Up-to-date with flu shot and Pneumovax  Return in about 3 months (around 03/28/2015) for hypertension.   This note has been created with Surveyor, quantity. Any transcriptional errors are unintentional.    Lorayne Marek, MD

## 2014-12-26 NOTE — Progress Notes (Signed)
Patient here for follow up on his pacreatitis And for general check up

## 2014-12-27 ENCOUNTER — Other Ambulatory Visit: Payer: Self-pay | Admitting: Internal Medicine

## 2014-12-27 LAB — VITAMIN D 25 HYDROXY (VIT D DEFICIENCY, FRACTURES): Vit D, 25-Hydroxy: <4 ng/mL — ABNORMAL LOW (ref 30–100)

## 2014-12-27 MED ORDER — PANCRELIPASE (LIP-PROT-AMYL) 24000-76000 UNITS PO CPEP: 24000.0000 [IU] | ORAL_CAPSULE | Freq: Every day | ORAL | Status: DC

## 2015-01-05 ENCOUNTER — Telehealth: Payer: Self-pay

## 2015-01-05 MED ORDER — VITAMIN D (ERGOCALCIFEROL) 1.25 MG (50000 UNIT) PO CAPS: 50000.0000 [IU] | ORAL_CAPSULE | ORAL | Status: DC

## 2015-01-05 NOTE — Telephone Encounter (Signed)
-----   Message from Lorayne Marek, MD sent at 12/27/2014  1:01 PM EDT ----- Blood work reviewed, noticed low vitamin D, call patient advise to start ergocalciferol 50,000 units once a week for the duration of  12 weeks.  noticed hemoglobin A1c of 6.0%, patient has prediabetes, call and advise patient for low carbohydrate diet. Noted borderline elevated liver enzymes, advise patient to avoid alcohol and Tylenol will repeat the test on the following visit.

## 2015-01-05 NOTE — Telephone Encounter (Signed)
Patient not available Left message on voice mail to return our call 

## 2015-03-22 ENCOUNTER — Emergency Department (HOSPITAL_COMMUNITY)
Admission: EM | Admit: 2015-03-22 | Discharge: 2015-03-23 | Disposition: A | Discharge status: Home / Self Care | Payer: Self-pay | Attending: Emergency Medicine | Admitting: Emergency Medicine

## 2015-03-22 ENCOUNTER — Emergency Department (HOSPITAL_COMMUNITY): Payer: Self-pay

## 2015-03-22 ENCOUNTER — Encounter (HOSPITAL_COMMUNITY): Payer: Self-pay | Admitting: *Deleted

## 2015-03-22 DIAGNOSIS — R1012 Left upper quadrant pain: Secondary | ICD-10-CM | POA: Insufficient documentation

## 2015-03-22 DIAGNOSIS — Z87738 Personal history of other specified (corrected) congenital malformations of digestive system: Secondary | ICD-10-CM | POA: Insufficient documentation

## 2015-03-22 DIAGNOSIS — R109 Unspecified abdominal pain: Secondary | ICD-10-CM

## 2015-03-22 DIAGNOSIS — K861 Other chronic pancreatitis: Secondary | ICD-10-CM | POA: Insufficient documentation

## 2015-03-22 DIAGNOSIS — R079 Chest pain, unspecified: Secondary | ICD-10-CM | POA: Insufficient documentation

## 2015-03-22 DIAGNOSIS — Z8739 Personal history of other diseases of the musculoskeletal system and connective tissue: Secondary | ICD-10-CM | POA: Insufficient documentation

## 2015-03-22 DIAGNOSIS — R1013 Epigastric pain: Secondary | ICD-10-CM | POA: Insufficient documentation

## 2015-03-22 DIAGNOSIS — Z720 Tobacco use: Secondary | ICD-10-CM | POA: Insufficient documentation

## 2015-03-22 LAB — BASIC METABOLIC PANEL
Anion gap: 7 (ref 5–15)
BUN: 8 mg/dL (ref 6–20)
CO2: 26 mmol/L (ref 22–32)
Calcium: 9.5 mg/dL (ref 8.9–10.3)
Chloride: 106 mmol/L (ref 101–111)
Creatinine, Ser: 0.95 mg/dL (ref 0.61–1.24)
GFR calc Af Amer: >60 mL/min (ref >60)
GFR calc non Af Amer: >60 mL/min (ref >60)
Glucose, Bld: 111 mg/dL — ABNORMAL HIGH (ref 65–99)
Potassium: 4 mmol/L (ref 3.5–5.1)
Sodium: 139 mmol/L (ref 135–145)

## 2015-03-22 LAB — URINALYSIS, ROUTINE W REFLEX MICROSCOPIC
Glucose, UA: NEGATIVE mg/dL
Hgb urine dipstick: NEGATIVE
Ketones, ur: 15 mg/dL — AB
Leukocytes, UA: NEGATIVE
Nitrite: NEGATIVE
Protein, ur: NEGATIVE mg/dL
Specific Gravity, Urine: 1.031 — ABNORMAL HIGH (ref 1.005–1.030)
Urobilinogen, UA: 0.2 mg/dL (ref 0.0–1.0)
pH: 5.5 (ref 5.0–8.0)

## 2015-03-22 LAB — CBC
HCT: 42.9 % (ref 39.0–52.0)
Hemoglobin: 14 g/dL (ref 13.0–17.0)
MCH: 27.7 pg (ref 26.0–34.0)
MCHC: 32.6 g/dL (ref 30.0–36.0)
MCV: 84.8 fL (ref 78.0–100.0)
Platelets: 194 10*3/uL (ref 150–400)
RBC: 5.06 MIL/uL (ref 4.22–5.81)
RDW: 14.2 % (ref 11.5–15.5)
WBC: 6.1 10*3/uL (ref 4.0–10.5)

## 2015-03-22 LAB — I-STAT TROPONIN, ED: Troponin i, poc: 0.01 ng/mL (ref 0.00–0.08)

## 2015-03-22 LAB — HEPATIC FUNCTION PANEL
ALT: 48 U/L (ref 17–63)
AST: 44 U/L — ABNORMAL HIGH (ref 15–41)
Albumin: 4.4 g/dL (ref 3.5–5.0)
Alkaline Phosphatase: 173 U/L — ABNORMAL HIGH (ref 38–126)
Bilirubin, Direct: 0.2 mg/dL (ref 0.1–0.5)
Indirect Bilirubin: 0.7 mg/dL (ref 0.3–0.9)
Total Bilirubin: 0.9 mg/dL (ref 0.3–1.2)
Total Protein: 7.1 g/dL (ref 6.5–8.1)

## 2015-03-22 LAB — LIPASE, BLOOD: Lipase: <10 U/L — ABNORMAL LOW (ref 22–51)

## 2015-03-22 MED ORDER — HYDROMORPHONE HCL 1 MG/ML IJ SOLN
1.0000 mg | INTRAMUSCULAR | Status: AC | PRN
  Administered 2015-03-22 – 2015-03-23 (×3): 1 mg via INTRAVENOUS
  Filled 2015-03-22 (×3): qty 1

## 2015-03-22 MED ORDER — SODIUM CHLORIDE 0.9 % IV SOLN
1000.0000 mL | INTRAVENOUS | Status: DC
  Administered 2015-03-22: 1000 mL via INTRAVENOUS

## 2015-03-22 MED ORDER — ONDANSETRON HCL 4 MG/2ML IJ SOLN
4.0000 mg | Freq: Once | INTRAMUSCULAR | Status: AC
  Administered 2015-03-22: 4 mg via INTRAVENOUS
  Filled 2015-03-22: qty 2

## 2015-03-22 MED ORDER — SODIUM CHLORIDE 0.9 % IV SOLN
1000.0000 mL | Freq: Once | INTRAVENOUS | Status: AC
  Administered 2015-03-22: 1000 mL via INTRAVENOUS

## 2015-03-22 NOTE — ED Provider Notes (Signed)
CSN: [id number]     Arrival date & time 03/22/15  1704 History   First MD Initiated Contact with Patient 03/22/15 2121     Chief Complaint  Patient presents with  . Chest Pain  . Abdominal Pain   Patient is a 49 y.o. male presenting with abdominal pain. The history is provided by the patient.  Abdominal Pain Pain location:  Epigastric and LUQ Pain quality: cramping and sharp   Pain radiates to:  Chest Pain severity:  Severe Onset quality:  Gradual Duration:  4 days Timing:  Constant Progression:  Worsening Context: previous surgery   Context: not alcohol use   Relieved by:  Nothing Ineffective treatments:  None tried Associated symptoms: nausea and vomiting (none today however)   Associated symptoms: no cough, no diarrhea and no fever    pain increases whenever he tries to eat or drink. The patient does have a history of pancreatitis. The symptoms feel similar to prior attacks. He supposed be taking pancreatic enzymes but his prescriptions ran out and his refills have not been mailed to him yet.  Past Medical History  Diagnosis Date  . Scoliosis   . Pancreatic abnormality     CT  shows mass  . Chronic pancreatitis     S/P Whipple 05/2014/notes 11/20/2014   Past Surgical History  Procedure Laterality Date  . Lumbar disc surgery  1990's  . Finger fracture surgery Left 1995    5th digit  . Eus N/A 09/21/2013    Procedure: ESOPHAGEAL ENDOSCOPIC ULTRASOUND (EUS) RADIAL;  Surgeon: Beryle Beams, MD;  Location: [facility];  Service: Endoscopy;  Laterality: N/A;  . Fine needle aspiration N/A 09/21/2013    Procedure: FINE NEEDLE ASPIRATION (FNA) LINEAR;  Surgeon: Beryle Beams, MD;  Location: [facility];  Service: Endoscopy;  Laterality: N/A;  . Eus N/A 09/30/2013    Procedure: UPPER ENDOSCOPIC ULTRASOUND (EUS) LINEAR;  Surgeon: Beryle Beams, MD;  Location: [facility];  Service: Endoscopy;  Laterality: N/A;  . Laparoscopy N/A 12/01/2013    Procedure: LAPAROSCOPY DIAGNOSTIC  PANCREATICODUODENECTOMY WITH BILIARY AND PANCREATIC STENTS;  Surgeon: Stark Klein, MD;  Location: [facility];  Service: General;  Laterality: N/A;  . Whipple procedure    . Fracture surgery    . Back surgery     Family History  Problem Relation Age of Onset  . Cancer Mother     unsure  . Hypertension Father    History  Substance Use Topics  . Smoking status: Current Every Day Smoker -- 0.25 packs/day for 31 years    Types: Cigarettes  . Smokeless tobacco: Former Systems developer    Types: Snuff  . Alcohol Use: 1.2 oz/week    2 Cans of beer per week    Review of Systems  Constitutional: Negative for fever.  Respiratory: Negative for cough.   Gastrointestinal: Positive for nausea, vomiting (none today however) and abdominal pain. Negative for diarrhea.  All other systems reviewed and are negative.     Allergies  Review of patient's allergies indicates no known allergies.  Home Medications   Prior to Admission medications   Medication Sig Start Date End Date Taking? Authorizing Provider  acetaminophen (TYLENOL) 500 MG tablet Take 500 mg by mouth every 6 (six) hours as needed for moderate pain (stomach pain).   Yes Historical Provider, MD  bismuth subsalicylate (PEPTO BISMOL) 262 MG chewable tablet Chew 524 mg by mouth 3 (three) times daily as needed for indigestion.   Yes Historical Provider, MD  Pancrelipase, Lip-Prot-Amyl, 24000 UNITS CPEP Take 1 capsule (24,000 Units total) by mouth at bedtime. 12/27/14  Yes Tresa Garter, MD  ondansetron (ZOFRAN ODT) 8 MG disintegrating tablet Take 1 tablet (8 mg total) by mouth every 8 (eight) hours as needed for nausea or vomiting. Patient not taking: Reported on 11/27/2014 09/30/14   Robbie Lis, MD  Vitamin D, Ergocalciferol, (DRISDOL) 50000 UNITS CAPS capsule Take 1 capsule (50,000 Units total) by mouth every 7 (seven) days. Patient not taking: Reported on 03/22/2015 01/05/15   Lorayne Marek, MD   BP 166/108 mmHg  Pulse 53  Temp(Src) 98.5 F  (36.9 C) (Oral)  Resp 12  SpO2 100% Physical Exam  Constitutional: He appears well-developed and well-nourished. No distress.  HENT:  Head: Normocephalic and atraumatic.  Right Ear: External ear normal.  Left Ear: External ear normal.  Eyes: Conjunctivae are normal. Right eye exhibits no discharge. Left eye exhibits no discharge. No scleral icterus.  Neck: Neck supple. No tracheal deviation present.  Cardiovascular: Normal rate, regular rhythm and intact distal pulses.   Pulmonary/Chest: Effort normal and breath sounds normal. No stridor. No respiratory distress. He has no wheezes. He has no rales.  Abdominal: Soft. Bowel sounds are normal. He exhibits no distension and no mass. There is tenderness in the epigastric area and left upper quadrant. There is guarding. There is no rigidity and no rebound. No hernia.  Musculoskeletal: He exhibits no edema or tenderness.  Neurological: He is alert. He has normal strength. No cranial nerve deficit (no facial droop, extraocular movements intact, no slurred speech) or sensory deficit. He exhibits normal muscle tone. He displays no seizure activity. Coordination normal.  Skin: Skin is warm and dry. No rash noted.  Psychiatric: He has a normal mood and affect.  Nursing note and vitals reviewed.   ED Course  Procedures (including critical care time) Labs Review Labs Reviewed  BASIC METABOLIC PANEL - Abnormal; Notable for the following:    Glucose, Bld 111 (*)    All other components within normal limits  LIPASE, BLOOD - Abnormal; Notable for the following:    Lipase <10 (*)    All other components within normal limits  URINALYSIS, ROUTINE W REFLEX MICROSCOPIC (NOT AT [facility]) - Abnormal; Notable for the following:    Color, Urine AMBER (*)    Specific Gravity, Urine 1.031 (*)    Bilirubin Urine SMALL (*)    Ketones, ur 15 (*)    All other components within normal limits  HEPATIC FUNCTION PANEL - Abnormal; Notable for the following:    AST 44  (*)    Alkaline Phosphatase 173 (*)    All other components within normal limits  CBC  I-STAT TROPOININ, ED    Imaging Review Dg Abd Acute W/chest  03/22/2015   CLINICAL DATA:  Subacute onset of chest and abdominal pain for 3 weeks. Intermittent constipation and diarrhea. Initial encounter.  EXAM: DG ABDOMEN ACUTE W/ 1V CHEST  COMPARISON:  Chest radiograph performed 08/14/2014, and CT of the abdomen and pelvis from 11/20/2014  FINDINGS: The lungs are well-aerated and clear. There is no evidence of focal opacification, pleural effusion or pneumothorax. The cardiomediastinal silhouette is within normal limits.  The visualized bowel gas pattern is unremarkable. Scattered stool and air are seen within the colon; there is no evidence of small bowel dilatation to suggest obstruction. No free intra-abdominal air is identified on the provided upright view. A bowel suture line is noted at the left mid abdomen. Minimal  clips are seen at the mid abdomen.  No acute osseous abnormalities are seen; the sacroiliac joints are unremarkable in appearance.  IMPRESSION: 1. Unremarkable bowel gas pattern; no free intra-abdominal air seen. Small amount of stool noted in the colon. 2. No acute cardiopulmonary process seen.   Electronically Signed   By: Garald Balding M.D.   On: 03/22/2015 23:08     EKG Interpretation   Date/Time:  Thursday March 22 2015 17:13:45 EDT Ventricular Rate:  89 PR Interval:  144 QRS Duration: 78 QT Interval:  364 QTC Calculation: 442 R Axis:   70 Text Interpretation:  Normal sinus rhythm Biatrial enlargement Abnormal  ECG No significant change since last tracing Confirmed by Laurey Salser  MD-J, Alois Colgan  ([id number]) on 03/22/2015 9:41:15 PM      MDM   Final diagnoses:  Abdominal pain    Pt is having persistent pain.  ?recurrent pancreatitis  ?bowel obstruction.  Will ct abdomen to evaluate further    Dorie Rank, MD 03/22/15 2336

## 2015-03-22 NOTE — ED Notes (Signed)
Patient transported to X-ray 

## 2015-03-22 NOTE — ED Notes (Signed)
Patient reports 4 day history of left sided chest pain associated with diaphoresis, nausea, and sob. Patient reports left lower abdominal pain. Patient with tenderness to palpation to left chest and left abdomen, denies injury.

## 2015-03-23 ENCOUNTER — Encounter (HOSPITAL_COMMUNITY): Payer: Self-pay | Admitting: Radiology

## 2015-03-23 ENCOUNTER — Emergency Department (HOSPITAL_COMMUNITY): Payer: Self-pay

## 2015-03-23 MED ORDER — OXYCODONE HCL 5 MG PO TABS: ORAL_TABLET | ORAL | Status: DC

## 2015-03-23 MED ORDER — PROMETHAZINE HCL 25 MG/ML IJ SOLN
12.5000 mg | Freq: Once | INTRAMUSCULAR | Status: DC
  Filled 2015-03-23: qty 1

## 2015-03-23 MED ORDER — PROMETHAZINE HCL 25 MG/ML IJ SOLN
12.5000 mg | Freq: Once | INTRAMUSCULAR | Status: AC
  Administered 2015-03-23: 12.5 mg via INTRAVENOUS
  Filled 2015-03-23: qty 1

## 2015-03-23 MED ORDER — PROMETHAZINE HCL 25 MG PO TABS
25.0000 mg | ORAL_TABLET | Freq: Four times a day (QID) | ORAL | Status: DC | PRN
Start: 2015-03-23 — End: 2015-04-06

## 2015-03-23 MED ORDER — DICYCLOMINE HCL 10 MG/ML IM SOLN
20.0000 mg | Freq: Once | INTRAMUSCULAR | Status: AC
  Administered 2015-03-23: 20 mg via INTRAMUSCULAR
  Filled 2015-03-23: qty 2

## 2015-03-23 MED ORDER — SODIUM CHLORIDE 0.9 % IV BOLUS (SEPSIS)
1000.0000 mL | Freq: Once | INTRAVENOUS | Status: AC
  Administered 2015-03-23: 1000 mL via INTRAVENOUS

## 2015-03-23 MED ORDER — IOHEXOL 300 MG/ML  SOLN
100.0000 mL | Freq: Once | INTRAMUSCULAR | Status: AC | PRN
  Administered 2015-03-23: 100 mL via INTRAVENOUS

## 2015-03-23 MED ORDER — HYDROMORPHONE HCL 1 MG/ML IJ SOLN: 1.0000 mg | Freq: Once | INTRAMUSCULAR | Status: DC

## 2015-03-23 MED ORDER — DICYCLOMINE HCL 20 MG PO TABS: 20.0000 mg | ORAL_TABLET | Freq: Four times a day (QID) | ORAL | Status: DC | PRN

## 2015-03-23 MED ORDER — PROMETHAZINE HCL 25 MG RE SUPP: 25.0000 mg | Freq: Four times a day (QID) | RECTAL | Status: DC | PRN

## 2015-03-23 MED ORDER — ONDANSETRON HCL 4 MG/2ML IJ SOLN
4.0000 mg | Freq: Once | INTRAMUSCULAR | Status: AC
  Administered 2015-03-23: 4 mg via INTRAVENOUS
  Filled 2015-03-23: qty 2

## 2015-03-23 NOTE — ED Notes (Signed)
Patient transported to CT 

## 2015-03-23 NOTE — ED Provider Notes (Signed)
Care assumed from Dr. Tomi Bamberger at change of shift.  Patient is awaiting CT scan.  Patient has history of chronic pancreatitis, has been out of his Creon for the last 3 weeks due to problems getting the medication shipped to him.  He has had nausea and vomiting up to today.  Results for orders placed or performed during the hospital encounter of 03/22/15  CBC  Result Value Ref Range   WBC 6.1 4.0 - 10.5 K/uL   RBC 5.06 4.22 - 5.81 MIL/uL   Hemoglobin 14.0 13.0 - 17.0 g/dL   HCT 42.9 39.0 - 52.0 %   MCV 84.8 78.0 - 100.0 fL   MCH 27.7 26.0 - 34.0 pg   MCHC 32.6 30.0 - 36.0 g/dL   RDW 14.2 11.5 - 15.5 %   Platelets 194 150 - 400 K/uL  Basic metabolic panel  Result Value Ref Range   Sodium 139 135 - 145 mmol/L   Potassium 4.0 3.5 - 5.1 mmol/L   Chloride 106 101 - 111 mmol/L   CO2 26 22 - 32 mmol/L   Glucose, Bld 111 (H) 65 - 99 mg/dL   BUN 8 6 - 20 mg/dL   Creatinine, Ser 0.95 0.61 - 1.24 mg/dL   Calcium 9.5 8.9 - 10.3 mg/dL   GFR calc non Af Amer >60 >60 mL/min   GFR calc Af Amer >60 >60 mL/min   Anion gap 7 5 - 15  Lipase, blood  Result Value Ref Range   Lipase <10 (L) 22 - 51 U/L  Urinalysis, Routine w reflex microscopic (not at [facility])  Result Value Ref Range   Color, Urine AMBER (A) YELLOW   APPearance CLEAR CLEAR   Specific Gravity, Urine 1.031 (H) 1.005 - 1.030   pH 5.5 5.0 - 8.0   Glucose, UA NEGATIVE NEGATIVE mg/dL   Hgb urine dipstick NEGATIVE NEGATIVE   Bilirubin Urine SMALL (A) NEGATIVE   Ketones, ur 15 (A) NEGATIVE mg/dL   Protein, ur NEGATIVE NEGATIVE mg/dL   Urobilinogen, UA 0.2 0.0 - 1.0 mg/dL   Nitrite NEGATIVE NEGATIVE   Leukocytes, UA NEGATIVE NEGATIVE  Hepatic function panel  Result Value Ref Range   Total Protein 7.1 6.5 - 8.1 g/dL   Albumin 4.4 3.5 - 5.0 g/dL   AST 44 (H) 15 - 41 U/L   ALT 48 17 - 63 U/L   Alkaline Phosphatase 173 (H) 38 - 126 U/L   Total Bilirubin 0.9 0.3 - 1.2 mg/dL   Bilirubin, Direct 0.2 0.1 - 0.5 mg/dL   Indirect Bilirubin 0.7  0.3 - 0.9 mg/dL  I-stat troponin, ED  (not at [facility])  Result Value Ref Range   Troponin i, poc 0.01 0.00 - 0.08 ng/mL   Comment 3           Ct Abdomen Pelvis W Contrast  03/23/2015   CLINICAL DATA:  Patient with left lower quadrant abdominal pain for 3 weeks. Nausea and vomiting.  EXAM: CT ABDOMEN AND PELVIS WITH CONTRAST  TECHNIQUE: Multidetector CT imaging of the abdomen and pelvis was performed using the standard protocol following bolus administration of intravenous contrast.  CONTRAST:  183mL OMNIPAQUE IOHEXOL 300 MG/ML  SOLN  COMPARISON:  CT 11/20/2014  FINDINGS: Lower chest: Dependent atelectasis within the bilateral lower lobes normal heart size.  Hepatobiliary: Liver is normal in size and contour. Geographic attenuation differences.  Pancreas: Postoperative changes compatible with prior Whipple procedure. No inflammatory change about the residual pancreas.  Spleen: Unremarkable  Adrenals/Urinary Tract:  Normal adrenal glands. Stable bilateral renal hypodensities. Kidneys enhance symmetrically with contrast.  Stomach/Bowel: No abnormal bowel wall thickening or evidence for bowel obstruction. No evidence for bowel obstruction.  Vascular/Lymphatic: Normal caliber abdominal aorta. No retroperitoneal lymphadenopathy.  Other: None  Musculoskeletal: No aggressive or acute appearing osseous lesions. Lower lumbar spine degenerative changes.  IMPRESSION: Postoperative changes compatible with Whipple procedure. No CT evidence to suggest acute pancreatitis. No acute findings in the abdomen or pelvis.   Electronically Signed   By: Lovey Newcomer M.D.   On: 03/23/2015 01:08   Dg Abd Acute W/chest  03/22/2015   CLINICAL DATA:  Subacute onset of chest and abdominal pain for 3 weeks. Intermittent constipation and diarrhea. Initial encounter.  EXAM: DG ABDOMEN ACUTE W/ 1V CHEST  COMPARISON:  Chest radiograph performed 08/14/2014, and CT of the abdomen and pelvis from 11/20/2014  FINDINGS: The lungs are well-aerated  and clear. There is no evidence of focal opacification, pleural effusion or pneumothorax. The cardiomediastinal silhouette is within normal limits.  The visualized bowel gas pattern is unremarkable. Scattered stool and air are seen within the colon; there is no evidence of small bowel dilatation to suggest obstruction. No free intra-abdominal air is identified on the provided upright view. A bowel suture line is noted at the left mid abdomen. Minimal clips are seen at the mid abdomen.  No acute osseous abnormalities are seen; the sacroiliac joints are unremarkable in appearance.  IMPRESSION: 1. Unremarkable bowel gas pattern; no free intra-abdominal air seen. Small amount of stool noted in the colon. 2. No acute cardiopulmonary process seen.   Electronically Signed   By: Garald Balding M.D.   On: 03/22/2015 23:08    No acute changes seen on CT scan.  Patient reports he is still nauseated, feels dehydrated and is still having pain.  He reports normally when he feels this way, he gets admitted.  We'll try another liter of fluid and repeat pain, nausea medicine, along with Bentyl.  Linton Flemings, MD 03/23/15 [phone]

## 2015-03-23 NOTE — Discharge Instructions (Signed)

## 2015-04-06 ENCOUNTER — Encounter (HOSPITAL_COMMUNITY): Payer: Self-pay

## 2015-04-06 ENCOUNTER — Emergency Department (HOSPITAL_COMMUNITY)
Admission: EM | Admit: 2015-04-06 | Discharge: 2015-04-06 | Disposition: A | Discharge status: Home / Self Care | Payer: Self-pay | Attending: Emergency Medicine | Admitting: Emergency Medicine

## 2015-04-06 DIAGNOSIS — R079 Chest pain, unspecified: Secondary | ICD-10-CM | POA: Insufficient documentation

## 2015-04-06 DIAGNOSIS — M549 Dorsalgia, unspecified: Secondary | ICD-10-CM | POA: Insufficient documentation

## 2015-04-06 DIAGNOSIS — K861 Other chronic pancreatitis: Secondary | ICD-10-CM | POA: Insufficient documentation

## 2015-04-06 DIAGNOSIS — Z720 Tobacco use: Secondary | ICD-10-CM | POA: Insufficient documentation

## 2015-04-06 LAB — CBC WITH DIFFERENTIAL/PLATELET
Basophils Absolute: 0.1 10*3/uL (ref 0.0–0.1)
Basophils Relative: 1 % (ref 0–1)
Eosinophils Absolute: 0.1 10*3/uL (ref 0.0–0.7)
Eosinophils Relative: 1 % (ref 0–5)
HCT: 45.7 % (ref 39.0–52.0)
Hemoglobin: 14.8 g/dL (ref 13.0–17.0)
Lymphocytes Relative: 32 % (ref 12–46)
Lymphs Abs: 2 10*3/uL (ref 0.7–4.0)
MCH: 27.7 pg (ref 26.0–34.0)
MCHC: 32.4 g/dL (ref 30.0–36.0)
MCV: 85.6 fL (ref 78.0–100.0)
Monocytes Absolute: 0.4 10*3/uL (ref 0.1–1.0)
Monocytes Relative: 6 % (ref 3–12)
Neutro Abs: 3.8 10*3/uL (ref 1.7–7.7)
Neutrophils Relative %: 60 % (ref 43–77)
Platelets: 180 10*3/uL (ref 150–400)
RBC: 5.34 MIL/uL (ref 4.22–5.81)
RDW: 14.1 % (ref 11.5–15.5)
WBC: 6.4 10*3/uL (ref 4.0–10.5)

## 2015-04-06 LAB — URINALYSIS, ROUTINE W REFLEX MICROSCOPIC
Glucose, UA: NEGATIVE mg/dL
Hgb urine dipstick: NEGATIVE
Ketones, ur: NEGATIVE mg/dL
Leukocytes, UA: NEGATIVE
Nitrite: NEGATIVE
Protein, ur: NEGATIVE mg/dL
Specific Gravity, Urine: 1.028 (ref 1.005–1.030)
Urobilinogen, UA: 0.2 mg/dL (ref 0.0–1.0)
pH: 5.5 (ref 5.0–8.0)

## 2015-04-06 LAB — COMPREHENSIVE METABOLIC PANEL
ALT: 40 U/L (ref 17–63)
AST: 47 U/L — ABNORMAL HIGH (ref 15–41)
Albumin: 4.6 g/dL (ref 3.5–5.0)
Alkaline Phosphatase: 145 U/L — ABNORMAL HIGH (ref 38–126)
Anion gap: 9 (ref 5–15)
BUN: 8 mg/dL (ref 6–20)
CO2: 28 mmol/L (ref 22–32)
Calcium: 9.6 mg/dL (ref 8.9–10.3)
Chloride: 104 mmol/L (ref 101–111)
Creatinine, Ser: 0.93 mg/dL (ref 0.61–1.24)
GFR calc Af Amer: >60 mL/min (ref >60)
GFR calc non Af Amer: >60 mL/min (ref >60)
Glucose, Bld: 116 mg/dL — ABNORMAL HIGH (ref 65–99)
Potassium: 3.6 mmol/L (ref 3.5–5.1)
Sodium: 141 mmol/L (ref 135–145)
Total Bilirubin: 0.8 mg/dL (ref 0.3–1.2)
Total Protein: 7.6 g/dL (ref 6.5–8.1)

## 2015-04-06 LAB — LIPASE, BLOOD: Lipase: 11 U/L — ABNORMAL LOW (ref 22–51)

## 2015-04-06 LAB — ETHANOL: Alcohol, Ethyl (B): <5 mg/dL (ref <5)

## 2015-04-06 MED ORDER — SODIUM CHLORIDE 0.9 % IV BOLUS (SEPSIS)
1000.0000 mL | Freq: Once | INTRAVENOUS | Status: AC
Start: 2015-04-06 — End: 2015-04-06
  Administered 2015-04-06: 1000 mL via INTRAVENOUS

## 2015-04-06 MED ORDER — HYDROMORPHONE HCL 1 MG/ML IJ SOLN
1.0000 mg | Freq: Once | INTRAMUSCULAR | Status: AC
  Administered 2015-04-06: 1 mg via INTRAVENOUS
  Filled 2015-04-06: qty 1

## 2015-04-06 MED ORDER — ONDANSETRON HCL 4 MG/2ML IJ SOLN
4.0000 mg | Freq: Once | INTRAMUSCULAR | Status: AC
  Administered 2015-04-06: 4 mg via INTRAVENOUS
  Filled 2015-04-06: qty 2

## 2015-04-06 MED ORDER — PROMETHAZINE HCL 25 MG PO TABS
25.0000 mg | ORAL_TABLET | Freq: Four times a day (QID) | ORAL | Status: DC | PRN
Start: 2015-04-06 — End: 2015-07-10

## 2015-04-06 MED ORDER — OXYCODONE-ACETAMINOPHEN 5-325 MG PO TABS: 2.0000 | ORAL_TABLET | ORAL | Status: DC | PRN

## 2015-04-06 NOTE — Discharge Instructions (Signed)

## 2015-04-06 NOTE — ED Notes (Signed)
Pt states he has a ride home

## 2015-04-06 NOTE — ED Notes (Signed)
Pt has been told that a urine sample is needed from him, a urinal is at the bedside

## 2015-04-06 NOTE — ED Provider Notes (Signed)
CSN: [id number]     Arrival date & time 04/06/15  1602 History   First MD Initiated Contact with Patient 04/06/15 1636     Chief Complaint  Patient presents with  . Chest Pain  . Abdominal Pain  . Back Pain     (Consider location/radiation/quality/duration/timing/severity/associated sxs/prior Treatment) HPI Comments: Patient presents to the ER for evaluation of abdominal pain and chest pain. Patient reports that he has a history of pancreatitis. He has been without his Creon for 3 months because of insurance issues. Patient reports that for the last week or so he has been experiencing increased left-sided abdominal pain with nausea and vomiting. He cannot eat because of nausea and vomiting. Pain is now radiating up into the left chest. No shortness of breath. Patient reports the symptoms he is currently experiencing are similar to flares of his pancreatitis and he has had in the past.  Patient is a 49 y.o. male presenting with chest pain, abdominal pain, and back pain.  Chest Pain Associated symptoms: abdominal pain, back pain, nausea and vomiting   Abdominal Pain Associated symptoms: chest pain, nausea and vomiting   Associated symptoms: no diarrhea   Back Pain Associated symptoms: abdominal pain and chest pain     Past Medical History  Diagnosis Date  . Scoliosis   . Pancreatic abnormality     CT  shows mass  . Chronic pancreatitis     S/P Whipple 05/2014/notes 11/20/2014   Past Surgical History  Procedure Laterality Date  . Lumbar disc surgery  1990's  . Finger fracture surgery Left 1995    5th digit  . Eus N/A 09/21/2013    Procedure: ESOPHAGEAL ENDOSCOPIC ULTRASOUND (EUS) RADIAL;  Surgeon: Beryle Beams, MD;  Location: [facility];  Service: Endoscopy;  Laterality: N/A;  . Fine needle aspiration N/A 09/21/2013    Procedure: FINE NEEDLE ASPIRATION (FNA) LINEAR;  Surgeon: Beryle Beams, MD;  Location: [facility];  Service: Endoscopy;  Laterality: N/A;  . Eus N/A 09/30/2013     Procedure: UPPER ENDOSCOPIC ULTRASOUND (EUS) LINEAR;  Surgeon: Beryle Beams, MD;  Location: [facility];  Service: Endoscopy;  Laterality: N/A;  . Laparoscopy N/A 12/01/2013    Procedure: LAPAROSCOPY DIAGNOSTIC PANCREATICODUODENECTOMY WITH BILIARY AND PANCREATIC STENTS;  Surgeon: Stark Klein, MD;  Location: [facility];  Service: General;  Laterality: N/A;  . Whipple procedure    . Fracture surgery    . Back surgery     Family History  Problem Relation Age of Onset  . Cancer Mother     unsure  . Hypertension Father    History  Substance Use Topics  . Smoking status: Current Every Day Smoker -- 0.25 packs/day for 31 years    Types: Cigarettes  . Smokeless tobacco: Former Systems developer    Types: Snuff  . Alcohol Use: 1.2 oz/week    2 Cans of beer per week    Review of Systems  Cardiovascular: Positive for chest pain.  Gastrointestinal: Positive for nausea, vomiting and abdominal pain. Negative for diarrhea, blood in stool and anal bleeding.  Musculoskeletal: Positive for back pain.  All other systems reviewed and are negative.     Allergies  Review of patient's allergies indicates no known allergies.  Home Medications   Prior to Admission medications   Medication Sig Start Date End Date Taking? Authorizing Provider  acetaminophen (TYLENOL) 500 MG tablet Take 500 mg by mouth every 6 (six) hours as needed for moderate pain (stomach pain).   Yes Historical  Provider, MD  bismuth subsalicylate (PEPTO BISMOL) 262 MG chewable tablet Chew 524 mg by mouth 3 (three) times daily as needed for indigestion.   Yes Historical Provider, MD  Pancrelipase, Lip-Prot-Amyl, 24000 UNITS CPEP Take 1 capsule (24,000 Units total) by mouth at bedtime. 12/27/14  Yes Tresa Garter, MD  Vitamin D, Ergocalciferol, (DRISDOL) 50000 UNITS CAPS capsule Take 1 capsule (50,000 Units total) by mouth every 7 (seven) days. 01/05/15  Yes Lorayne Marek, MD  dicyclomine (BENTYL) 20 MG tablet Take 1 tablet (20 mg total) by  mouth every 6 (six) hours as needed for spasms (for abdominal cramping). Patient not taking: Reported on 04/06/2015 03/23/15   Linton Flemings, MD  ondansetron (ZOFRAN ODT) 8 MG disintegrating tablet Take 1 tablet (8 mg total) by mouth every 8 (eight) hours as needed for nausea or vomiting. Patient not taking: Reported on 11/27/2014 09/30/14   Robbie Lis, MD  oxyCODONE (ROXICODONE) 5 MG immediate release tablet Take 1-2 tab po every 4-6 hours prn pain Patient not taking: Reported on 04/06/2015 03/23/15   Linton Flemings, MD  promethazine (PHENERGAN) 25 MG suppository Place 1 suppository (25 mg total) rectally every 6 (six) hours as needed for nausea or vomiting. Patient not taking: Reported on 04/06/2015 03/23/15   Linton Flemings, MD  promethazine (PHENERGAN) 25 MG tablet Take 1 tablet (25 mg total) by mouth every 6 (six) hours as needed for nausea. Patient not taking: Reported on 04/06/2015 03/23/15   Linton Flemings, MD   BP 143/106 mmHg  Pulse 105  Temp(Src) 98.2 F (36.8 C) (Oral)  Resp 19  SpO2 100% Physical Exam  Constitutional: He is oriented to person, place, and time. He appears well-developed and well-nourished. No distress.  HENT:  Head: Normocephalic and atraumatic.  Right Ear: Hearing normal.  Left Ear: Hearing normal.  Nose: Nose normal.  Mouth/Throat: Oropharynx is clear and moist and mucous membranes are normal.  Eyes: Conjunctivae and EOM are normal. Pupils are equal, round, and reactive to light.  Neck: Normal range of motion. Neck supple.  Cardiovascular: Regular rhythm, S1 normal and S2 normal.  Exam reveals no gallop and no friction rub.   No murmur heard. Pulmonary/Chest: Effort normal and breath sounds normal. No respiratory distress. He exhibits no tenderness.  Abdominal: Soft. Normal appearance and bowel sounds are normal. There is no hepatosplenomegaly. There is tenderness in the epigastric area and left upper quadrant. There is no rebound, no guarding, no tenderness at McBurney's point and  negative Murphy's sign. No hernia.  Musculoskeletal: Normal range of motion.  Neurological: He is alert and oriented to person, place, and time. He has normal strength. No cranial nerve deficit or sensory deficit. Coordination normal. GCS eye subscore is 4. GCS verbal subscore is 5. GCS motor subscore is 6.  Skin: Skin is warm, dry and intact. No rash noted. No cyanosis.  Psychiatric: He has a normal mood and affect. His speech is normal and behavior is normal. Thought content normal.  Nursing note and vitals reviewed.   ED Course  Procedures (including critical care time) Labs Review Labs Reviewed  COMPREHENSIVE METABOLIC PANEL - Abnormal; Notable for the following:    Glucose, Bld 116 (*)    AST 47 (*)    Alkaline Phosphatase 145 (*)    All other components within normal limits  LIPASE, BLOOD - Abnormal; Notable for the following:    Lipase 11 (*)    All other components within normal limits  URINALYSIS, ROUTINE W REFLEX MICROSCOPIC (NOT  AT [facility]) - Abnormal; Notable for the following:    Color, Urine AMBER (*)    Bilirubin Urine SMALL (*)    All other components within normal limits  CBC WITH DIFFERENTIAL/PLATELET  ETHANOL  I-STAT TROPOININ, ED    Imaging Review No results found.   EKG Interpretation   Date/Time:  Friday April 06 2015 16:14:39 EDT Ventricular Rate:  98 PR Interval:  122 QRS Duration: 143 QT Interval:  374 QTC Calculation: 477 R Axis:   -88 Text Interpretation:  Ectopic atrial rhythm Left atrial enlargement LVH  with secondary repolarization abnormality No significant change since last  tracing Confirmed by Enzo Treu  MD, [place] [phone]) on 04/06/2015 4:37:20  PM      MDM   Final diagnoses:  None  abdominal pain  Patient presents to the ER for evaluation of abdominal and chest pain. Patient has a history of seemingly chronic abdominal pain. He reports that he has a history of pancreatitis and has had an acute flare. Patient has been seen in the  ER several times for this. Patient indicates that his pain is secondary to his inability to get his Creon. He is experiencing pain diffusely in the upper abdomen, mostly left-sided and there is tenderness in this region. There is no signs of peritonitis. Pain in the chest appears to be radiating from the abdomen. Cardiac evaluation was unremarkable. Patient does not have an elevated lipase at this time. He does not appear to be acutely ill or septic. Patient treated with IV fluids and analgesia. He is appropriate for outpatient management.    Orpah Greek, MD 04/06/15 Vernelle Emerald

## 2015-04-06 NOTE — ED Notes (Signed)
Pt presents with c/o chest pain, abdominal pain, and back pain. Pt reports he has had chest pain for a couple of days. Pt also reports abdominal pain with a hx of pancreatitis. Pt also c/o back pain on the lower left side.

## 2015-04-09 LAB — I-STAT TROPONIN, ED: Troponin i, poc: 0.02 ng/mL (ref 0.00–0.08)

## 2015-04-10 ENCOUNTER — Ambulatory Visit: Payer: Self-pay | Attending: Internal Medicine | Admitting: Internal Medicine

## 2015-04-10 ENCOUNTER — Encounter: Payer: Self-pay | Admitting: Internal Medicine

## 2015-04-10 VITALS — BP 138/100 | HR 79 | Temp 98.6°F | Resp 18 | Ht 75.0 in | Wt 155.4 lb

## 2015-04-10 DIAGNOSIS — Z720 Tobacco use: Secondary | ICD-10-CM

## 2015-04-10 DIAGNOSIS — I10 Essential (primary) hypertension: Secondary | ICD-10-CM

## 2015-04-10 DIAGNOSIS — F17200 Nicotine dependence, unspecified, uncomplicated: Secondary | ICD-10-CM

## 2015-04-10 DIAGNOSIS — K861 Other chronic pancreatitis: Secondary | ICD-10-CM

## 2015-04-10 DIAGNOSIS — M79621 Pain in right upper arm: Secondary | ICD-10-CM

## 2015-04-10 MED ORDER — AMLODIPINE BESYLATE 5 MG PO TABS: 5.0000 mg | ORAL_TABLET | Freq: Every day | ORAL | Status: DC

## 2015-04-10 MED ORDER — NAPROXEN 500 MG PO TABS: 500.0000 mg | ORAL_TABLET | Freq: Two times a day (BID) | ORAL | Status: DC

## 2015-04-10 NOTE — Progress Notes (Signed)
Patient here for ED follow up. Patient reports he feels decent today. Patient denies any paint today. Patient reports he has still been experiencing nausea and vomiting. Patient reports he has a little bit of diarrhea as well. Patient reports severe dehydration as well. Patient was last in the hospital on July 8th. Patient reports phenergan has been helping.   Patient concerned about location of placement of IV while in the hospital. Patient reports vein is bulging and discolored and is painful. Located on upper right arm.  BP 138/100 manually

## 2015-04-10 NOTE — Patient Instructions (Signed)
DASH Eating Plan °DASH stands for "Dietary Approaches to Stop Hypertension." The DASH eating plan is a healthy eating plan that has been shown to reduce high blood pressure (hypertension). Additional health benefits may include reducing the risk of type 2 diabetes mellitus, heart disease, and stroke. The DASH eating plan may also help with weight loss. °WHAT DO I NEED TO KNOW ABOUT THE DASH EATING PLAN? °For the DASH eating plan, you will follow these general guidelines: °· Choose foods with a percent daily value for sodium of less than 5% (as listed on the food label). °· Use salt-free seasonings or herbs instead of table salt or sea salt. °· Check with your health care provider or pharmacist before using salt substitutes. °· Eat lower-sodium products, often labeled as "lower sodium" or "no salt added." °· Eat fresh foods. °· Eat more vegetables, fruits, and low-fat dairy products. °· Choose whole grains. Look for the word "whole" as the first word in the ingredient list. °· Choose fish and skinless chicken or turkey more often than red meat. Limit fish, poultry, and meat to 6 oz (170 g) each day. °· Limit sweets, desserts, sugars, and sugary drinks. °· Choose heart-healthy fats. °· Limit cheese to 1 oz (28 g) per day. °· Eat more home-cooked food and less restaurant, buffet, and fast food. °· Limit fried foods. °· Cook foods using methods other than frying. °· Limit canned vegetables. If you do use them, rinse them well to decrease the sodium. °· When eating at a restaurant, ask that your food be prepared with less salt, or no salt if possible. °WHAT FOODS CAN I EAT? °Seek help from a dietitian for individual calorie needs. °Grains °Whole grain or whole wheat bread. Brown rice. Whole grain or whole wheat pasta. Quinoa, bulgur, and whole grain cereals. Low-sodium cereals. Corn or whole wheat flour tortillas. Whole grain cornbread. Whole grain crackers. Low-sodium crackers. °Vegetables °Fresh or frozen vegetables  (raw, steamed, roasted, or grilled). Low-sodium or reduced-sodium tomato and vegetable juices. Low-sodium or reduced-sodium tomato sauce and paste. Low-sodium or reduced-sodium canned vegetables.  °Fruits °All fresh, canned (in natural juice), or frozen fruits. °Meat and Other Protein Products °Ground beef (85% or leaner), grass-fed beef, or beef trimmed of fat. Skinless chicken or turkey. Ground chicken or turkey. Pork trimmed of fat. All fish and seafood. Eggs. Dried beans, peas, or lentils. Unsalted nuts and seeds. Unsalted canned beans. °Dairy °Low-fat dairy products, such as skim or 1% milk, 2% or reduced-fat cheeses, low-fat ricotta or cottage cheese, or plain low-fat yogurt. Low-sodium or reduced-sodium cheeses. °Fats and Oils °Tub margarines without trans fats. Light or reduced-fat mayonnaise and salad dressings (reduced sodium). Avocado. Safflower, olive, or canola oils. Natural peanut or almond butter. °Other °Unsalted popcorn and pretzels. °The items listed above may not be a complete list of recommended foods or beverages. Contact your dietitian for more options. °WHAT FOODS ARE NOT RECOMMENDED? °Grains °White bread. White pasta. White rice. Refined cornbread. Bagels and croissants. Crackers that contain trans fat. °Vegetables °Creamed or fried vegetables. Vegetables in a cheese sauce. Regular canned vegetables. Regular canned tomato sauce and paste. Regular tomato and vegetable juices. °Fruits °Dried fruits. Canned fruit in light or heavy syrup. Fruit juice. °Meat and Other Protein Products °Fatty cuts of meat. Ribs, chicken wings, bacon, sausage, bologna, salami, chitterlings, fatback, hot dogs, bratwurst, and packaged luncheon meats. Salted nuts and seeds. Canned beans with salt. °Dairy °Whole or 2% milk, cream, half-and-half, and cream cheese. Whole-fat or sweetened yogurt. Full-fat   cheeses or blue cheese. Nondairy creamers and whipped toppings. Processed cheese, cheese spreads, or cheese  curds. °Condiments °Onion and garlic salt, seasoned salt, table salt, and sea salt. Canned and packaged gravies. Worcestershire sauce. Tartar sauce. Barbecue sauce. Teriyaki sauce. Soy sauce, including reduced sodium. Steak sauce. Fish sauce. Oyster sauce. Cocktail sauce. Horseradish. Ketchup and mustard. Meat flavorings and tenderizers. Bouillon cubes. Hot sauce. Tabasco sauce. Marinades. Taco seasonings. Relishes. °Fats and Oils °Butter, stick margarine, lard, shortening, ghee, and bacon fat. Coconut, palm kernel, or palm oils. Regular salad dressings. °Other °Pickles and olives. Salted popcorn and pretzels. °The items listed above may not be a complete list of foods and beverages to avoid. Contact your dietitian for more information. °WHERE CAN I FIND MORE INFORMATION? °[employer], [employer], [employer] Institute: www.nhlbi.nih.gov/health/health-topics/topics/dash/ °Document Released: 09/04/2011 Document Revised: 01/30/2014 Document Reviewed: 07/20/2013 °ExitCare® Patient Information ©2015 [employer]. This information is not intended to replace advice given to you by your health care provider. Make sure you discuss any questions you have with your health care provider. ° °

## 2015-04-10 NOTE — Progress Notes (Signed)
MRN: [id number] Name: Brent Rogers  Sex: male Age: 49 y.o. DOB: August 27, 1966  Allergies: Review of patient's allergies indicates no known allergies.  Chief Complaint  Patient presents with  . Follow-up    HPI: Patient is 49 y.o. male who has history of chronic pancreatitis, recently went to the emergency room with symptoms of abdominal/chest pain, EMR reviewed, his cardiac relation was unremarkable, also his lipase level was not elevated, he was treated with IV fluids and analgesia, patient has tried with his pharmacy and is going to get a refill on his Creon, denies currently any nausea vomiting, his blood pressure also has been running high, he does report family history of hypertension and is not on any medication, he also reports pain and tenderness on the IV site which she had in his right upper arm when he went to the emergency room almost 3 weeks ago on June 23rd  Since then it has been painful to touch, denies any pain with movement of the upper arm, denies any fever chills chest and shortness of breath.  Past Medical History  Diagnosis Date  . Scoliosis   . Pancreatic abnormality     CT  shows mass  . Chronic pancreatitis     S/P Whipple 05/2014/notes 11/20/2014    Past Surgical History  Procedure Laterality Date  . Lumbar disc surgery  1990's  . Finger fracture surgery Left 1995    5th digit  . Eus N/A 09/21/2013    Procedure: ESOPHAGEAL ENDOSCOPIC ULTRASOUND (EUS) RADIAL;  Surgeon: Beryle Beams, MD;  Location: [facility];  Service: Endoscopy;  Laterality: N/A;  . Fine needle aspiration N/A 09/21/2013    Procedure: FINE NEEDLE ASPIRATION (FNA) LINEAR;  Surgeon: Beryle Beams, MD;  Location: [facility];  Service: Endoscopy;  Laterality: N/A;  . Eus N/A 09/30/2013    Procedure: UPPER ENDOSCOPIC ULTRASOUND (EUS) LINEAR;  Surgeon: Beryle Beams, MD;  Location: [facility];  Service: Endoscopy;  Laterality: N/A;  . Laparoscopy N/A 12/01/2013    Procedure: LAPAROSCOPY  DIAGNOSTIC PANCREATICODUODENECTOMY WITH BILIARY AND PANCREATIC STENTS;  Surgeon: Stark Klein, MD;  Location: [facility];  Service: General;  Laterality: N/A;  . Whipple procedure    . Fracture surgery    . Back surgery        Medication List       This list is accurate as of: 04/10/15 12:54 PM.  Always use your most recent med list.               acetaminophen 500 MG tablet  Commonly known as:  TYLENOL  Take 500 mg by mouth every 6 (six) hours as needed for moderate pain (stomach pain).     amLODipine 5 MG tablet  Commonly known as:  NORVASC  Take 1 tablet (5 mg total) by mouth daily.     bismuth subsalicylate 786 MG chewable tablet  Commonly known as:  PEPTO BISMOL  Chew 524 mg by mouth 3 (three) times daily as needed for indigestion.     naproxen 500 MG tablet  Commonly known as:  NAPROSYN  Take 1 tablet (500 mg total) by mouth 2 (two) times daily with a meal.     oxyCODONE-acetaminophen 5-325 MG per tablet  Commonly known as:  PERCOCET  Take 2 tablets by mouth every 4 (four) hours as needed.     Pancrelipase (Lip-Prot-Amyl) 24000 UNITS Cpep  Take 1 capsule (24,000 Units total) by mouth at bedtime.     promethazine  25 MG tablet  Commonly known as:  PHENERGAN  Take 1 tablet (25 mg total) by mouth every 6 (six) hours as needed for nausea or vomiting.     Vitamin D (Ergocalciferol) 50000 UNITS Caps capsule  Commonly known as:  DRISDOL  Take 1 capsule (50,000 Units total) by mouth every 7 (seven) days.        Meds ordered this encounter  Medications  . naproxen (NAPROSYN) 500 MG tablet    Sig: Take 1 tablet (500 mg total) by mouth 2 (two) times daily with a meal.    Dispense:  60 tablet    Refill:  1  . amLODipine (NORVASC) 5 MG tablet    Sig: Take 1 tablet (5 mg total) by mouth daily.    Dispense:  90 tablet    Refill:  3    Immunization History  Administered Date(s) Administered  . Influenza,inj,Quad PF,36+ Mos 11/22/2014  . Influenza-Unspecified  05/30/2013  . Pneumococcal Polysaccharide-23 09/22/2013    Family History  Problem Relation Age of Onset  . Cancer Mother     unsure  . Hypertension Father     History  Substance Use Topics  . Smoking status: Current Every Day Smoker -- 0.25 packs/day for 31 years    Types: Cigarettes  . Smokeless tobacco: Former Systems developer    Types: Snuff  . Alcohol Use: 1.2 oz/week    2 Cans of beer per week    Review of Systems   As noted in HPI  Filed Vitals:   04/10/15 1157  BP: 138/100  Pulse: 79  Temp: 98.6 F (37 C)  Resp: 18    Physical Exam  Physical Exam  Constitutional: No distress.  Eyes: EOM are normal. Pupils are equal, round, and reactive to light.  Cardiovascular: Normal rate and regular rhythm.   Pulmonary/Chest: Breath sounds normal. No respiratory distress. He has no wheezes. He has no rales.  Abdominal: Soft. There is no tenderness.  Musculoskeletal:  Right upper arm superficial  vein is tender to touch, no apparent erythema or skin breakdown or any signs of infection.    CBC    Component Value Date/Time   WBC 6.4 04/06/2015 1630   RBC 5.34 04/06/2015 1630   HGB 14.8 04/06/2015 1630   HCT 45.7 04/06/2015 1630   PLT 180 04/06/2015 1630   MCV 85.6 04/06/2015 1630   LYMPHSABS 2.0 04/06/2015 1630   MONOABS 0.4 04/06/2015 1630   EOSABS 0.1 04/06/2015 1630   BASOSABS 0.1 04/06/2015 1630    CMP     Component Value Date/Time   NA 141 04/06/2015 1630   K 3.6 04/06/2015 1630   CL 104 04/06/2015 1630   CO2 28 04/06/2015 1630   GLUCOSE 116* 04/06/2015 1630   BUN 8 04/06/2015 1630   CREATININE 0.93 04/06/2015 1630   CREATININE 0.77 12/26/2014 1048   CALCIUM 9.6 04/06/2015 1630   PROT 7.6 04/06/2015 1630   ALBUMIN 4.6 04/06/2015 1630   AST 47* 04/06/2015 1630   ALT 40 04/06/2015 1630   ALKPHOS 145* 04/06/2015 1630   BILITOT 0.8 04/06/2015 1630   GFRNONAA >60 04/06/2015 1630   GFRNONAA >89 12/26/2014 1048   GFRAA >60 04/06/2015 1630   GFRAA >89  12/26/2014 1048    Lab Results  Component Value Date/Time   CHOL 121 09/30/2014 05:33 AM    Lab Results  Component Value Date/Time   HGBA1C 6.0* 12/26/2014 10:48 AM    Lab Results  Component Value Date/Time   AST  73* 04/06/2015 04:30 PM    Assessment and Plan  Essential hypertension - Plan: advised patient for DASH diet, also started on amLODipine (NORVASC) 5 MG tablet, patient will come back in 2 weeks  nurse visit BP check  Smoking Counseled patient to quit smoking  Chronic pancreatitis, unspecified pancreatitis type Patient will resume back on Creon  Pain of right upper arm - Plan: Likely superficial, thrombophlebitis, will get UE VENOUS DUPLEX, naproxen (NAPROSYN) 500 MG tablet   Return in about 3 months (around 07/11/2015) for BP check in 2 weeks/Nurse Visit.   This note has been created with Surveyor, quantity. Any transcriptional errors are unintentional.    Lorayne Marek, MD

## 2015-04-11 ENCOUNTER — Ambulatory Visit (HOSPITAL_COMMUNITY)
Admission: RE | Admit: 2015-04-11 | Discharge: 2015-04-11 | Disposition: A | Discharge status: Home / Self Care | Payer: MEDICAID | Source: Ambulatory Visit | Attending: Internal Medicine | Admitting: Internal Medicine

## 2015-04-11 DIAGNOSIS — M7989 Other specified soft tissue disorders: Secondary | ICD-10-CM | POA: Insufficient documentation

## 2015-04-11 DIAGNOSIS — M79601 Pain in right arm: Secondary | ICD-10-CM | POA: Insufficient documentation

## 2015-04-11 DIAGNOSIS — M79621 Pain in right upper arm: Secondary | ICD-10-CM

## 2015-04-11 NOTE — Progress Notes (Signed)
Right upper extremity venous duplex completed:  No evidence of DVT.  There appears to be superficial thrombosis in the right cephalic vein and a branch.

## 2015-04-12 ENCOUNTER — Telehealth: Payer: Self-pay

## 2015-04-12 NOTE — Telephone Encounter (Signed)
-----   Message from Lorayne Marek, MD sent at 04/12/2015  9:18 AM EDT ----- Call and let the patient know that ultrasound of his right arm is negative for DVT but has superficial thrombosis, continue with supportive treatment with cold/warm compress, patient has been prescribed NSAIDs to help with the pain.

## 2015-04-12 NOTE — Telephone Encounter (Signed)
Patient not available Left message with family member to have him return our call 

## 2015-04-25 ENCOUNTER — Ambulatory Visit: Payer: Self-pay | Attending: Internal Medicine | Admitting: *Deleted

## 2015-04-25 VITALS — BP 98/64 | HR 76 | Temp 97.9°F | Resp 18 | Ht 75.0 in | Wt 150.8 lb

## 2015-04-25 DIAGNOSIS — Z720 Tobacco use: Secondary | ICD-10-CM | POA: Insufficient documentation

## 2015-04-25 DIAGNOSIS — I10 Essential (primary) hypertension: Secondary | ICD-10-CM | POA: Insufficient documentation

## 2015-04-25 MED ORDER — AMLODIPINE BESYLATE 2.5 MG PO TABS: 2.5000 mg | ORAL_TABLET | Freq: Every day | ORAL | Status: DC

## 2015-04-25 MED ORDER — VITAMIN D (ERGOCALCIFEROL) 1.25 MG (50000 UNIT) PO CAPS: 50000.0000 [IU] | ORAL_CAPSULE | ORAL | Status: DC

## 2015-04-25 NOTE — Patient Instructions (Signed)
DASH Eating Plan °DASH stands for "Dietary Approaches to Stop Hypertension." The DASH eating plan is a healthy eating plan that has been shown to reduce high blood pressure (hypertension). Additional health benefits may include reducing the risk of type 2 diabetes mellitus, heart disease, and stroke. The DASH eating plan may also help with weight loss. °WHAT DO I NEED TO KNOW ABOUT THE DASH EATING PLAN? °For the DASH eating plan, you will follow these general guidelines: °· Choose foods with a percent daily value for sodium of less than 5% (as listed on the food label). °· Use salt-free seasonings or herbs instead of table salt or sea salt. °· Check with your health care provider or pharmacist before using salt substitutes. °· Eat lower-sodium products, often labeled as "lower sodium" or "no salt added." °· Eat fresh foods. °· Eat more vegetables, fruits, and low-fat dairy products. °· Choose whole grains. Look for the word "whole" as the first word in the ingredient list. °· Choose fish and skinless chicken or turkey more often than red meat. Limit fish, poultry, and meat to 6 oz (170 g) each day. °· Limit sweets, desserts, sugars, and sugary drinks. °· Choose heart-healthy fats. °· Limit cheese to 1 oz (28 g) per day. °· Eat more home-cooked food and less restaurant, buffet, and fast food. °· Limit fried foods. °· Cook foods using methods other than frying. °· Limit canned vegetables. If you do use them, rinse them well to decrease the sodium. °· When eating at a restaurant, ask that your food be prepared with less salt, or no salt if possible. °WHAT FOODS CAN I EAT? °Seek help from a dietitian for individual calorie needs. °Grains °Whole grain or whole wheat bread. Brown rice. Whole grain or whole wheat pasta. Quinoa, bulgur, and whole grain cereals. Low-sodium cereals. Corn or whole wheat flour tortillas. Whole grain cornbread. Whole grain crackers. Low-sodium crackers. °Vegetables °Fresh or frozen vegetables  (raw, steamed, roasted, or grilled). Low-sodium or reduced-sodium tomato and vegetable juices. Low-sodium or reduced-sodium tomato sauce and paste. Low-sodium or reduced-sodium canned vegetables.  °Fruits °All fresh, canned (in natural juice), or frozen fruits. °Meat and Other Protein Products °Ground beef (85% or leaner), grass-fed beef, or beef trimmed of fat. Skinless chicken or turkey. Ground chicken or turkey. Pork trimmed of fat. All fish and seafood. Eggs. Dried beans, peas, or lentils. Unsalted nuts and seeds. Unsalted canned beans. °Dairy °Low-fat dairy products, such as skim or 1% milk, 2% or reduced-fat cheeses, low-fat ricotta or cottage cheese, or plain low-fat yogurt. Low-sodium or reduced-sodium cheeses. °Fats and Oils °Tub margarines without trans fats. Light or reduced-fat mayonnaise and salad dressings (reduced sodium). Avocado. Safflower, olive, or canola oils. Natural peanut or almond butter. °Other °Unsalted popcorn and pretzels. °The items listed above may not be a complete list of recommended foods or beverages. Contact your dietitian for more options. °WHAT FOODS ARE NOT RECOMMENDED? °Grains °White bread. White pasta. White rice. Refined cornbread. Bagels and croissants. Crackers that contain trans fat. °Vegetables °Creamed or fried vegetables. Vegetables in a cheese sauce. Regular canned vegetables. Regular canned tomato sauce and paste. Regular tomato and vegetable juices. °Fruits °Dried fruits. Canned fruit in light or heavy syrup. Fruit juice. °Meat and Other Protein Products °Fatty cuts of meat. Ribs, chicken wings, bacon, sausage, bologna, salami, chitterlings, fatback, hot dogs, bratwurst, and packaged luncheon meats. Salted nuts and seeds. Canned beans with salt. °Dairy °Whole or 2% milk, cream, half-and-half, and cream cheese. Whole-fat or sweetened yogurt. Full-fat   cheeses or blue cheese. Nondairy creamers and whipped toppings. Processed cheese, cheese spreads, or cheese  curds. °Condiments °Onion and garlic salt, seasoned salt, table salt, and sea salt. Canned and packaged gravies. Worcestershire sauce. Tartar sauce. Barbecue sauce. Teriyaki sauce. Soy sauce, including reduced sodium. Steak sauce. Fish sauce. Oyster sauce. Cocktail sauce. Horseradish. Ketchup and mustard. Meat flavorings and tenderizers. Bouillon cubes. Hot sauce. Tabasco sauce. Marinades. Taco seasonings. Relishes. °Fats and Oils °Butter, stick margarine, lard, shortening, ghee, and bacon fat. Coconut, palm kernel, or palm oils. Regular salad dressings. °Other °Pickles and olives. Salted popcorn and pretzels. °The items listed above may not be a complete list of foods and beverages to avoid. Contact your dietitian for more information. °WHERE CAN I FIND MORE INFORMATION? °[employer], [employer], [employer] Institute: www.nhlbi.nih.gov/health/health-topics/topics/dash/ °Document Released: 09/04/2011 Document Revised: 01/30/2014 Document Reviewed: 07/20/2013 °ExitCare® Patient Information ©2015 [employer]. This information is not intended to replace advice given to you by your health care provider. Make sure you discuss any questions you have with your health care provider. ° °

## 2015-04-25 NOTE — Progress Notes (Signed)
Patient presents for BP check after starting amlodipine 5 mg Med list reviewed; states taking all meds as directed Did not know about Vit D; will begin today Discussed need for low sodium diet and using Mrs. Dash as alternative to salt Encouraged to choose foods with 5% or less of daily value for sodium. Coaches baseball 7 days a week Positive for "little bit" headaches, blurred vision, SHOB, chest pain since starting amlodipine C/o feeling "drained", having less energy and dizzy lasting all day since starting amlodipine Advised to take med at bedtime to see if these sx improve  Filed Vitals:   04/25/15 1133  BP: 98/64  Pulse: 76  Temp: 97.9 F (36.6 C)  Resp: 18    Per PCP: Decrease amlodipine to 2.5 mg daily at bedtime  Patient will return for Nurse visit for BP check in 2 weeks  Patient advised to call for med refills at least 7 days before running out so as not to go without.  Patient given literature on DASH Eating Plan

## 2015-05-07 ENCOUNTER — Telehealth: Payer: Self-pay

## 2015-05-07 ENCOUNTER — Telehealth: Payer: Self-pay | Admitting: Internal Medicine

## 2015-05-07 NOTE — Telephone Encounter (Signed)
Patient called stating that his bp meds are making his bp drop to low, he would like some advice. Please f/u

## 2015-05-07 NOTE — Telephone Encounter (Signed)
Returned patient phone call Patient states he feels like he has some fluid build up in his chest Anything he eats or drinks feels like it is not going down Patient was instructed about the walk in  Clinic to be seen

## 2015-05-18 ENCOUNTER — Emergency Department (HOSPITAL_COMMUNITY): Payer: Self-pay

## 2015-05-18 ENCOUNTER — Encounter (HOSPITAL_COMMUNITY): Payer: Self-pay | Admitting: Emergency Medicine

## 2015-05-18 ENCOUNTER — Emergency Department (HOSPITAL_COMMUNITY)
Admission: EM | Admit: 2015-05-18 | Discharge: 2015-05-18 | Disposition: A | Discharge status: Home / Self Care | Payer: Self-pay | Attending: Emergency Medicine | Admitting: Emergency Medicine

## 2015-05-18 DIAGNOSIS — R109 Unspecified abdominal pain: Secondary | ICD-10-CM

## 2015-05-18 DIAGNOSIS — K861 Other chronic pancreatitis: Secondary | ICD-10-CM | POA: Insufficient documentation

## 2015-05-18 DIAGNOSIS — Z79899 Other long term (current) drug therapy: Secondary | ICD-10-CM | POA: Insufficient documentation

## 2015-05-18 DIAGNOSIS — I10 Essential (primary) hypertension: Secondary | ICD-10-CM | POA: Insufficient documentation

## 2015-05-18 DIAGNOSIS — R1013 Epigastric pain: Secondary | ICD-10-CM | POA: Insufficient documentation

## 2015-05-18 DIAGNOSIS — R079 Chest pain, unspecified: Secondary | ICD-10-CM | POA: Insufficient documentation

## 2015-05-18 DIAGNOSIS — Z791 Long term (current) use of non-steroidal anti-inflammatories (NSAID): Secondary | ICD-10-CM | POA: Insufficient documentation

## 2015-05-18 DIAGNOSIS — Z8739 Personal history of other diseases of the musculoskeletal system and connective tissue: Secondary | ICD-10-CM | POA: Insufficient documentation

## 2015-05-18 DIAGNOSIS — R112 Nausea with vomiting, unspecified: Secondary | ICD-10-CM | POA: Insufficient documentation

## 2015-05-18 DIAGNOSIS — Z720 Tobacco use: Secondary | ICD-10-CM | POA: Insufficient documentation

## 2015-05-18 LAB — COMPREHENSIVE METABOLIC PANEL
ALT: 29 U/L (ref 17–63)
AST: 51 U/L — ABNORMAL HIGH (ref 15–41)
Albumin: 3.8 g/dL (ref 3.5–5.0)
Alkaline Phosphatase: 117 U/L (ref 38–126)
Anion gap: 15 (ref 5–15)
BUN: 6 mg/dL (ref 6–20)
CO2: 22 mmol/L (ref 22–32)
Calcium: 9.1 mg/dL (ref 8.9–10.3)
Chloride: 98 mmol/L — ABNORMAL LOW (ref 101–111)
Creatinine, Ser: 0.8 mg/dL (ref 0.61–1.24)
GFR calc Af Amer: >60 mL/min (ref >60)
GFR calc non Af Amer: >60 mL/min (ref >60)
Glucose, Bld: 121 mg/dL — ABNORMAL HIGH (ref 65–99)
Potassium: 3.7 mmol/L (ref 3.5–5.1)
Sodium: 135 mmol/L (ref 135–145)
Total Bilirubin: 0.8 mg/dL (ref 0.3–1.2)
Total Protein: 6 g/dL — ABNORMAL LOW (ref 6.5–8.1)

## 2015-05-18 LAB — LIPASE, BLOOD: Lipase: 69 U/L — ABNORMAL HIGH (ref 22–51)

## 2015-05-18 LAB — CBC
HCT: 43.1 % (ref 39.0–52.0)
Hemoglobin: 14.8 g/dL (ref 13.0–17.0)
MCH: 29 pg (ref 26.0–34.0)
MCHC: 34.3 g/dL (ref 30.0–36.0)
MCV: 84.3 fL (ref 78.0–100.0)
Platelets: 216 10*3/uL (ref 150–400)
RBC: 5.11 MIL/uL (ref 4.22–5.81)
RDW: 14.8 % (ref 11.5–15.5)
WBC: 7.5 10*3/uL (ref 4.0–10.5)

## 2015-05-18 LAB — I-STAT TROPONIN, ED: Troponin i, poc: 0 ng/mL (ref 0.00–0.08)

## 2015-05-18 MED ORDER — ONDANSETRON HCL 4 MG/2ML IJ SOLN
4.0000 mg | Freq: Once | INTRAMUSCULAR | Status: AC
  Administered 2015-05-18: 4 mg via INTRAVENOUS
  Filled 2015-05-18: qty 2

## 2015-05-18 MED ORDER — SODIUM CHLORIDE 0.9 % IV BOLUS (SEPSIS)
1000.0000 mL | Freq: Once | INTRAVENOUS | Status: AC
  Administered 2015-05-18: 1000 mL via INTRAVENOUS

## 2015-05-18 MED ORDER — ONDANSETRON 8 MG PO TBDP: 8.0000 mg | ORAL_TABLET | Freq: Three times a day (TID) | ORAL | Status: DC | PRN

## 2015-05-18 MED ORDER — HYDROMORPHONE HCL 1 MG/ML IJ SOLN
1.0000 mg | Freq: Once | INTRAMUSCULAR | Status: AC
  Administered 2015-05-18: 1 mg via INTRAVENOUS
  Filled 2015-05-18: qty 1

## 2015-05-18 MED ORDER — IBUPROFEN 600 MG PO TABS: 600.0000 mg | ORAL_TABLET | Freq: Three times a day (TID) | ORAL | Status: DC | PRN

## 2015-05-18 NOTE — ED Provider Notes (Signed)
CSN: [id number]     Arrival date & time 05/18/15  0307 History   This chart was scribed for Brent Schmidt, MD by Forrestine Him, ED Scribe. This patient was seen in room A13C/A13C and the patient's care was started 3:44 AM.   Chief Complaint  Patient presents with  . Abdominal Pain  . Chest Pain   The history is provided by the patient. No language interpreter was used.    HPI Comments: Brent Rogers brought in by EMS is a 49 y.o. male with a PMHx of chronic pancreatitis who presents to the Emergency Department complaining of intermittent, ongoing vomiting with associated nausea onset 8:00 PM last night.   Pt also reports L sided chest pain worsened when vomiting and abdominal pain. Pain to abdomen currently rated 7/10. No alleviating factors at this time. No OTC medications attempted prior to arrival. However, 324 ASA and 1 dose of Nitro given en route to department. No recent fever, chills, or shortness of breath. PSHx includes whipple procedure performed 05/2014. No known allergies to medications.  Denies chest pain at this time.  No fevers or chills.  No hematemesis.  No melena or hematochezia.  Past Medical History  Diagnosis Date  . Scoliosis   . Pancreatic abnormality     CT  shows mass  . Chronic pancreatitis     S/P Whipple 05/2014/notes 11/20/2014  . Hypertension    Past Surgical History  Procedure Laterality Date  . Lumbar disc surgery  1990's  . Finger fracture surgery Left 1995    5th digit  . Eus N/A 09/21/2013    Procedure: ESOPHAGEAL ENDOSCOPIC ULTRASOUND (EUS) RADIAL;  Surgeon: Beryle Beams, MD;  Location: [facility];  Service: Endoscopy;  Laterality: N/A;  . Fine needle aspiration N/A 09/21/2013    Procedure: FINE NEEDLE ASPIRATION (FNA) LINEAR;  Surgeon: Beryle Beams, MD;  Location: [facility];  Service: Endoscopy;  Laterality: N/A;  . Eus N/A 09/30/2013    Procedure: UPPER ENDOSCOPIC ULTRASOUND (EUS) LINEAR;  Surgeon: Beryle Beams, MD;  Location: [facility];  Service: Endoscopy;  Laterality: N/A;  . Laparoscopy N/A 12/01/2013    Procedure: LAPAROSCOPY DIAGNOSTIC PANCREATICODUODENECTOMY WITH BILIARY AND PANCREATIC STENTS;  Surgeon: Stark Klein, MD;  Location: [facility];  Service: General;  Laterality: N/A;  . Whipple procedure    . Fracture surgery    . Back surgery     Family History  Problem Relation Age of Onset  . Cancer Mother     unsure  . Hypertension Father    Social History  Substance Use Topics  . Smoking status: Current Every Day Smoker -- 0.25 packs/day for 31 years    Types: Cigarettes  . Smokeless tobacco: Former Systems developer    Types: Snuff     Comment: Smoking 3 cigs/day  . Alcohol Use: 1.2 oz/week    2 Cans of beer per week    Review of Systems  Constitutional: Negative for fever and chills.  Respiratory: Negative for cough and shortness of breath.   Cardiovascular: Positive for chest pain.  Gastrointestinal: Positive for nausea, vomiting and abdominal pain. Negative for diarrhea.  Neurological: Negative for headaches.  Psychiatric/Behavioral: Negative for confusion.  All other systems reviewed and are negative.     Allergies  Review of patient's allergies indicates no known allergies.  Home Medications   Prior to Admission medications   Medication Sig Start Date End Date Taking? Authorizing Provider  acetaminophen (TYLENOL) 500 MG tablet Take 500 mg by  mouth every 6 (six) hours as needed for moderate pain (stomach pain).    Historical Provider, MD  amLODipine (NORVASC) 2.5 MG tablet Take 1 tablet (2.5 mg total) by mouth daily. 04/25/15   Lorayne Marek, MD  bismuth subsalicylate (PEPTO BISMOL) 262 MG chewable tablet Chew 524 mg by mouth 3 (three) times daily as needed for indigestion.    Historical Provider, MD  naproxen (NAPROSYN) 500 MG tablet Take 1 tablet (500 mg total) by mouth 2 (two) times daily with a meal. 04/10/15   Lorayne Marek, MD  oxyCODONE-acetaminophen (PERCOCET) 5-325 MG per tablet Take 2  tablets by mouth every 4 (four) hours as needed. 04/06/15   Orpah Greek, MD  Pancrelipase, Lip-Prot-Amyl, 24000 UNITS CPEP Take 1 capsule (24,000 Units total) by mouth at bedtime. 12/27/14   Tresa Garter, MD  promethazine (PHENERGAN) 25 MG tablet Take 1 tablet (25 mg total) by mouth every 6 (six) hours as needed for nausea or vomiting. 04/06/15   Orpah Greek, MD  Vitamin D, Ergocalciferol, (DRISDOL) 50000 UNITS CAPS capsule Take 1 capsule (50,000 Units total) by mouth every 7 (seven) days. 04/25/15   Lorayne Marek, MD   Triage Vitals: BP 114/83 mmHg  Pulse 88  Temp(Src) 98.5 F (36.9 C) (Oral)  Resp 10  SpO2 96%   Physical Exam  Constitutional: He is oriented to person, place, and time. He appears well-developed and well-nourished.  HENT:  Head: Normocephalic and atraumatic.  Eyes: EOM are normal.  Neck: Normal range of motion.  Cardiovascular: Normal rate, regular rhythm, normal heart sounds and intact distal pulses.   Pulmonary/Chest: Effort normal and breath sounds normal. No respiratory distress.  Abdominal: Soft. He exhibits no distension. There is tenderness.  Mild epigastric tenderness noted   Musculoskeletal: Normal range of motion.  Neurological: He is alert and oriented to person, place, and time.  Skin: Skin is warm and dry.  Psychiatric: He has a normal mood and affect. Judgment normal.  Nursing note and vitals reviewed.   ED Course  Procedures (including critical care time)  DIAGNOSTIC STUDIES: Oxygen Saturation is 96% on RA, adequate by my interpretation.    COORDINATION OF CARE: 3:49 AM- Will order Lipase, urinalysis, BMP, CBC, i-stat troponin I, CXR, and EKG. Will give Dilaudid, Zofran, and fluids. Discussed treatment plan with pt at bedside and pt agreed to plan.     Labs Review Labs Reviewed  LIPASE, BLOOD - Abnormal; Notable for the following:    Lipase 69 (*)    All other components within normal limits  COMPREHENSIVE METABOLIC PANEL  - Abnormal; Notable for the following:    Chloride 98 (*)    Glucose, Bld 121 (*)    Total Protein 6.0 (*)    AST 51 (*)    All other components within normal limits  CBC  I-STAT TROPOININ, ED    Imaging Review Dg Chest 2 View  05/18/2015   CLINICAL DATA:  Left-sided chest pain  EXAM: CHEST  2 VIEW  COMPARISON:  03/22/2015  FINDINGS: Normal heart size and mediastinal contours. Calcified granulomas bilateral lungs. No acute infiltrate or edema. No effusion or pneumothorax. No acute osseous findings.  IMPRESSION: No active cardiopulmonary disease.   Electronically Signed   By: Monte Fantasia M.D.   On: 05/18/2015 04:12   I have personally reviewed and evaluated these images and lab results as part of my medical decision-making.   EKG Interpretation   Date/Time:  Friday May 18 2015 03:12:39 EDT Ventricular Rate:  89 PR Interval:  155 QRS Duration: 84 QT Interval:  351 QTC Calculation: 427 R Axis:   70 Text Interpretation:  Sinus rhythm Biatrial enlargement Anteroseptal  infarct, old Early repolarization, probably normal variant No significant  change was found Confirmed by Emidio Warrell  MD, Obadiah Dennard ([id number]) on 05/18/2015  3:46:16 AM      MDM   Final diagnoses:  Abdominal pain, unspecified abdominal location    6:17 AM Patient is feeling better at this time.  Labs and x-rays without significant abnormalities.  Doubt ACS.  Likely acute on chronic pancreatitis.  Feeling better.  Recommend clear liquid diet 24 hours then slow advance of his diet.  Home with anti-inflammatories and antinausea medicine  I personally performed the services described in this documentation, which was scribed in my presence. The recorded information has been reviewed and is accurate.       Brent Schmidt, MD 05/18/15 [phone]

## 2015-05-18 NOTE — ED Notes (Signed)
Pt in EMS from home C/O CP and abd pain. 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 Also reports N/V, dizziness. Given 324 ASA, 1 NTG, 4 mg zofran.

## 2015-05-21 ENCOUNTER — Ambulatory Visit: Payer: Self-pay | Attending: Family Medicine | Admitting: *Deleted

## 2015-05-21 VITALS — BP 106/75 | HR 98 | Temp 98.4°F | Resp 18 | Ht 75.0 in | Wt 144.6 lb

## 2015-05-21 DIAGNOSIS — Z720 Tobacco use: Secondary | ICD-10-CM | POA: Insufficient documentation

## 2015-05-21 DIAGNOSIS — R634 Abnormal weight loss: Secondary | ICD-10-CM

## 2015-05-21 DIAGNOSIS — I10 Essential (primary) hypertension: Secondary | ICD-10-CM

## 2015-05-21 NOTE — Progress Notes (Signed)
Patient presents for BP check after decreasing amlodipine to 2.5 mg daily States was in ED 3 days ago for flare of pancreatitis with N/V, fever, fluctuating BP, SHOB and Chest pain Med list reviewed; states taking all meds as directed Patient is not adding salt to foods or cooking with salt; now using Mrs Deliah Boston as alternative to salt. 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 States since Whipple surgery in March 2015 has had bouts of diarrhea within 5 minutes following meal and has difficulty gaining weight. Stopped all alcohol 3 weeks ago Smoking 3 cigs per day; not trying to quit C/o insomnia. Discussed trying OTC melatonin starting with low dose  Filed Vitals:   05/21/15 1547  BP: 106/75  Pulse: 98  Temp: 98.4 F (36.9 C)  Resp: 18     Per Covering Provider: Keep amlodipine at 2.5 mg daily Referral to GI Okay to try melatonin Avoid NSAIDs  Use aceminophen for pain  Patient advised to call for med refills at least 7 days before running out so as not to go without.  Patient aware that he is to f/u with PCP 3 months from last visit (Due 07/11/15). Will establish care with new provider at that time.

## 2015-06-23 ENCOUNTER — Emergency Department (HOSPITAL_COMMUNITY): Payer: Self-pay

## 2015-06-23 ENCOUNTER — Encounter (HOSPITAL_COMMUNITY): Payer: Self-pay | Admitting: Emergency Medicine

## 2015-06-23 ENCOUNTER — Emergency Department (HOSPITAL_COMMUNITY)
Admission: EM | Admit: 2015-06-23 | Discharge: 2015-06-24 | Disposition: A | Discharge status: Home / Self Care | Payer: Self-pay | Attending: Emergency Medicine | Admitting: Emergency Medicine

## 2015-06-23 DIAGNOSIS — Z720 Tobacco use: Secondary | ICD-10-CM | POA: Insufficient documentation

## 2015-06-23 DIAGNOSIS — M419 Scoliosis, unspecified: Secondary | ICD-10-CM | POA: Insufficient documentation

## 2015-06-23 DIAGNOSIS — Q453 Other congenital malformations of pancreas and pancreatic duct: Secondary | ICD-10-CM | POA: Insufficient documentation

## 2015-06-23 DIAGNOSIS — R109 Unspecified abdominal pain: Secondary | ICD-10-CM

## 2015-06-23 DIAGNOSIS — Z8719 Personal history of other diseases of the digestive system: Secondary | ICD-10-CM | POA: Insufficient documentation

## 2015-06-23 DIAGNOSIS — R1084 Generalized abdominal pain: Secondary | ICD-10-CM | POA: Insufficient documentation

## 2015-06-23 DIAGNOSIS — I10 Essential (primary) hypertension: Secondary | ICD-10-CM | POA: Insufficient documentation

## 2015-06-23 DIAGNOSIS — R10A0 Flank pain, unspecified side: Secondary | ICD-10-CM

## 2015-06-23 LAB — COMPREHENSIVE METABOLIC PANEL
ALT: 19 U/L (ref 17–63)
AST: 35 U/L (ref 15–41)
Albumin: 3.4 g/dL — ABNORMAL LOW (ref 3.5–5.0)
Alkaline Phosphatase: 103 U/L (ref 38–126)
Anion gap: 14 (ref 5–15)
BUN: 7 mg/dL (ref 6–20)
CO2: 21 mmol/L — ABNORMAL LOW (ref 22–32)
Calcium: 8.4 mg/dL — ABNORMAL LOW (ref 8.9–10.3)
Chloride: 101 mmol/L (ref 101–111)
Creatinine, Ser: 1.01 mg/dL (ref 0.61–1.24)
GFR calc Af Amer: >60 mL/min (ref >60)
GFR calc non Af Amer: >60 mL/min (ref >60)
Glucose, Bld: 71 mg/dL (ref 65–99)
Potassium: 4.3 mmol/L (ref 3.5–5.1)
Sodium: 136 mmol/L (ref 135–145)
Total Bilirubin: 1.2 mg/dL (ref 0.3–1.2)
Total Protein: 5.9 g/dL — ABNORMAL LOW (ref 6.5–8.1)

## 2015-06-23 LAB — URINALYSIS, ROUTINE W REFLEX MICROSCOPIC
Glucose, UA: NEGATIVE mg/dL
Hgb urine dipstick: NEGATIVE
Ketones, ur: >80 mg/dL — AB
Nitrite: NEGATIVE
Protein, ur: 30 mg/dL — AB
Specific Gravity, Urine: 1.026 (ref 1.005–1.030)
Urobilinogen, UA: 1 mg/dL (ref 0.0–1.0)
pH: 5 (ref 5.0–8.0)

## 2015-06-23 LAB — CBC WITH DIFFERENTIAL/PLATELET
Basophils Absolute: 0 K/uL (ref 0.0–0.1)
Basophils Relative: 0 %
Eosinophils Absolute: 0 K/uL (ref 0.0–0.7)
Eosinophils Relative: 0 %
HCT: 43.9 % (ref 39.0–52.0)
Hemoglobin: 15.1 g/dL (ref 13.0–17.0)
Lymphocytes Relative: 9 %
Lymphs Abs: 1 K/uL (ref 0.7–4.0)
MCH: 29.7 pg (ref 26.0–34.0)
MCHC: 34.4 g/dL (ref 30.0–36.0)
MCV: 86.4 fL (ref 78.0–100.0)
Monocytes Absolute: 0.9 K/uL (ref 0.1–1.0)
Monocytes Relative: 8 %
Neutro Abs: 9.7 K/uL — ABNORMAL HIGH (ref 1.7–7.7)
Neutrophils Relative %: 83 %
Platelets: 210 K/uL (ref 150–400)
RBC: 5.08 MIL/uL (ref 4.22–5.81)
RDW: 14.1 % (ref 11.5–15.5)
WBC: 11.6 K/uL — ABNORMAL HIGH (ref 4.0–10.5)

## 2015-06-23 LAB — I-STAT CG4 LACTIC ACID, ED
Lactic Acid, Venous: 2.48 mmol/L (ref 0.5–2.0)
Lactic Acid, Venous: 0.84 mmol/L (ref 0.5–2.0)
Lactic Acid, Venous: 0.87 mmol/L (ref 0.5–2.0)

## 2015-06-23 LAB — RAPID URINE DRUG SCREEN, HOSP PERFORMED
Amphetamines: NOT DETECTED
Barbiturates: NOT DETECTED
Benzodiazepines: NOT DETECTED
Cocaine: NOT DETECTED
Opiates: NOT DETECTED
Tetrahydrocannabinol: NOT DETECTED

## 2015-06-23 LAB — URINE MICROSCOPIC-ADD ON

## 2015-06-23 LAB — ETHANOL: Alcohol, Ethyl (B): <5 mg/dL (ref <5)

## 2015-06-23 MED ORDER — SODIUM CHLORIDE 0.9 % IV BOLUS (SEPSIS)
2000.0000 mL | Freq: Once | INTRAVENOUS | Status: AC
  Administered 2015-06-23: 2000 mL via INTRAVENOUS

## 2015-06-23 MED ORDER — SODIUM CHLORIDE 0.9 % IV BOLUS (SEPSIS)
1000.0000 mL | Freq: Once | INTRAVENOUS | Status: AC
  Administered 2015-06-23: 1000 mL via INTRAVENOUS

## 2015-06-23 MED ORDER — MORPHINE SULFATE (PF) 4 MG/ML IV SOLN
4.0000 mg | Freq: Once | INTRAVENOUS | Status: AC
  Administered 2015-06-23: 4 mg via INTRAVENOUS
  Filled 2015-06-23: qty 1

## 2015-06-23 MED ORDER — PROCHLORPERAZINE EDISYLATE 5 MG/ML IJ SOLN
10.0000 mg | Freq: Once | INTRAMUSCULAR | Status: AC
  Administered 2015-06-23: 10 mg via INTRAVENOUS
  Filled 2015-06-23: qty 2

## 2015-06-23 MED ORDER — ONDANSETRON HCL 4 MG/2ML IJ SOLN
4.0000 mg | Freq: Once | INTRAMUSCULAR | Status: AC
  Administered 2015-06-23: 4 mg via INTRAVENOUS
  Filled 2015-06-23: qty 2

## 2015-06-23 MED ORDER — SODIUM CHLORIDE 0.9 % IV SOLN
Freq: Once | INTRAVENOUS | Status: AC
  Administered 2015-06-23: 14:00:00 via INTRAVENOUS

## 2015-06-23 NOTE — ED Provider Notes (Signed)
Face-to-face evaluation   History: He states he has been unable to tolerate any oral nutrition or fluids, for 3 days and has persistent vomiting and that he feels dehydrated. This is a recurrent problem for him and feels like his usual pancreatitis.  Physical exam: Alert, calm, cooperative. Mouth is moist. Abdomen soft, mild left upper quadrant tenderness. No rebound tenderness.  16:30- . Additional fluids and anti-emetic ordered.  Medications  sodium chloride 0.9 % bolus 1,000 mL (0 mLs Intravenous Stopped 06/23/15 1630)  ondansetron (ZOFRAN) injection 4 mg (4 mg Intravenous Given 06/23/15 1350)  morphine 4 MG/ML injection 4 mg (4 mg Intravenous Given 06/23/15 1350)  0.9 %  sodium chloride infusion ( Intravenous Stopped 06/23/15 1528)  sodium chloride 0.9 % bolus 1,000 mL (0 mLs Intravenous Stopped 06/23/15 1734)  sodium chloride 0.9 % bolus 2,000 mL (0 mLs Intravenous Stopped 06/23/15 1921)  prochlorperazine (COMPAZINE) injection 10 mg (10 mg Intravenous Given 06/23/15 1649)    Patient Vitals for the past 24 hrs:  BP Temp Temp src Pulse Resp SpO2  06/24/15 0000 135/96 mmHg - - 74 - 99 %  06/23/15 2345 (!) 137/101 mmHg - - 74 - 99 %  06/23/15 2330 138/93 mmHg - - (!) 57 - 99 %  06/23/15 2315 136/92 mmHg - - 68 - 98 %  06/23/15 2300 137/96 mmHg - - 72 - 97 %  06/23/15 2245 132/89 mmHg - - 74 - 100 %  06/23/15 2230 131/95 mmHg - - 75 - 100 %  06/23/15 2215 138/97 mmHg - - 73 - 100 %  06/23/15 2200 143/96 mmHg - - 78 - 98 %  06/23/15 2145 136/97 mmHg - - 69 - 99 %  06/23/15 2130 125/85 mmHg - - 75 - 100 %  06/23/15 2115 130/90 mmHg - - 80 - 100 %  06/23/15 2000 118/100 mmHg - - 81 - 99 %  06/23/15 1945 136/92 mmHg - - 84 - 99 %  06/23/15 1930 148/96 mmHg - - 83 - 98 %  06/23/15 1915 121/79 mmHg - - 83 - 99 %  06/23/15 1900 133/90 mmHg - - 88 - 97 %  06/23/15 1815 125/82 mmHg - - 83 - 100 %  06/23/15 1800 135/93 mmHg - - 81 - 100 %  06/23/15 1730 173/82 mmHg - - (!) 54 - 100 %   06/23/15 1630 133/91 mmHg - - 80 - 99 %  06/23/15 1600 132/85 mmHg - - 83 - 100 %  06/23/15 1445 139/84 mmHg - - 84 - 97 %  06/23/15 1430 139/87 mmHg - - 86 - 99 %  06/23/15 1415 132/91 mmHg - - 89 - 98 %  06/23/15 1410 131/90 mmHg - - 90 16 100 %  06/23/15 1339 147/89 mmHg - - 92 - 100 %  06/23/15 1301 122/85 mmHg 98.4 F (36.9 C) Oral 101 16 100 %    12:13 AM Reevaluation with update and discussion. After initial assessment and treatment, an updated evaluation reveals he has had a bowel movement, feels somewhat better. Discussed with the patient, all questions were answered. Denene Alamillo L    Results for orders placed or performed during the hospital encounter of 06/23/15  CBC with Differential  Result Value Ref Range   WBC 11.6 (H) 4.0 - 10.5 K/uL   RBC 5.08 4.22 - 5.81 MIL/uL   Hemoglobin 15.1 13.0 - 17.0 g/dL   HCT 43.9 39.0 - 52.0 %   MCV 86.4 78.0 - 100.0 fL  MCH 29.7 26.0 - 34.0 pg   MCHC 34.4 30.0 - 36.0 g/dL   RDW 14.1 11.5 - 15.5 %   Platelets 210 150 - 400 K/uL   Neutrophils Relative % 83 %   Neutro Abs 9.7 (H) 1.7 - 7.7 K/uL   Lymphocytes Relative 9 %   Lymphs Abs 1.0 0.7 - 4.0 K/uL   Monocytes Relative 8 %   Monocytes Absolute 0.9 0.1 - 1.0 K/uL   Eosinophils Relative 0 %   Eosinophils Absolute 0.0 0.0 - 0.7 K/uL   Basophils Relative 0 %   Basophils Absolute 0.0 0.0 - 0.1 K/uL  Urinalysis, Routine w reflex microscopic (not at [facility])  Result Value Ref Range   Color, Urine AMBER (A) YELLOW   APPearance CLOUDY (A) CLEAR   Specific Gravity, Urine 1.026 1.005 - 1.030   pH 5.0 5.0 - 8.0   Glucose, UA NEGATIVE NEGATIVE mg/dL   Hgb urine dipstick NEGATIVE NEGATIVE   Bilirubin Urine SMALL (A) NEGATIVE   Ketones, ur >80 (A) NEGATIVE mg/dL   Protein, ur 30 (A) NEGATIVE mg/dL   Urobilinogen, UA 1.0 0.0 - 1.0 mg/dL   Nitrite NEGATIVE NEGATIVE   Leukocytes, UA TRACE (A) NEGATIVE  Urine microscopic-add on  Result Value Ref Range   Squamous Epithelial / LPF RARE  RARE   WBC, UA 0-2 <3 WBC/hpf   RBC / HPF 0-2 <3 RBC/hpf   Casts HYALINE CASTS (A) NEGATIVE  Comprehensive metabolic panel  Result Value Ref Range   Sodium 136 135 - 145 mmol/L   Potassium 4.3 3.5 - 5.1 mmol/L   Chloride 101 101 - 111 mmol/L   CO2 21 (L) 22 - 32 mmol/L   Glucose, Bld 71 65 - 99 mg/dL   BUN 7 6 - 20 mg/dL   Creatinine, Ser 1.01 0.61 - 1.24 mg/dL   Calcium 8.4 (L) 8.9 - 10.3 mg/dL   Total Protein 5.9 (L) 6.5 - 8.1 g/dL   Albumin 3.4 (L) 3.5 - 5.0 g/dL   AST 35 15 - 41 U/L   ALT 19 17 - 63 U/L   Alkaline Phosphatase 103 38 - 126 U/L   Total Bilirubin 1.2 0.3 - 1.2 mg/dL   GFR calc non Af Amer >60 >60 mL/min   GFR calc Af Amer >60 >60 mL/min   Anion gap 14 5 - 15  Urine rapid drug screen (hosp performed)  Result Value Ref Range   Opiates NONE DETECTED NONE DETECTED   Cocaine NONE DETECTED NONE DETECTED   Benzodiazepines NONE DETECTED NONE DETECTED   Amphetamines NONE DETECTED NONE DETECTED   Tetrahydrocannabinol NONE DETECTED NONE DETECTED   Barbiturates NONE DETECTED NONE DETECTED  Ethanol  Result Value Ref Range   Alcohol, Ethyl (B) <5 <5 mg/dL  I-Stat CG4 Lactic Acid, ED  Result Value Ref Range   Lactic Acid, Venous 2.48 (HH) 0.5 - 2.0 mmol/L   Comment NOTIFIED PHYSICIAN   I-Stat CG4 Lactic Acid, ED  Result Value Ref Range   Lactic Acid, Venous 0.84 0.5 - 2.0 mmol/L  I-Stat CG4 Lactic Acid, ED  Result Value Ref Range   Lactic Acid, Venous 0.87 0.5 - 2.0 mmol/L     Ct Renal Stone Study  06/23/2015   CLINICAL DATA:  Patient with left flank pain and left lower quadrant abdominal pain. Nausea and vomiting.  EXAM: CT ABDOMEN AND PELVIS WITHOUT CONTRAST  TECHNIQUE: Multidetector CT imaging of the abdomen and pelvis was performed following the standard protocol without IV contrast.  COMPARISON:  CT abdomen pelvis 03/23/2015  FINDINGS: Lower chest: Normal heart size. No consolidative pulmonary opacities.  Hepatobiliary: The liver is diffusely low in attenuation  compatible with steatosis. Pneumobilia within the central aspect of biliary system.  Pancreas: Postsurgical changes compatible with Whipple procedure.  Spleen:  Unremarkable  Adrenals/Urinary Tract: Kidneys are symmetric in size. No hydronephrosis. No nephroureterolithiasis. Stable nonspecific perinephric fat stranding. Urinary bladder is unremarkable. Stable bilateral renal hypodensities.  Stomach/Bowel: No evidence for bowel obstruction. Normal appendix. No free fluid or free intraperitoneal air. Postsurgical changes within the left upper quadrant involving the stomach and proximal small bowel.  Vascular/Lymphatic: Tortuous yet normal caliber abdominal aorta. No retroperitoneal lymphadenopathy.  Other: None  Musculoskeletal: Lumbar spine degenerative changes. No aggressive or acute appearing osseous lesions.  IMPRESSION: No nephroureterolithiasis.  No hydronephrosis.  Postsurgical changes compatible with Whipple procedure.  Hepatic steatosis.   Electronically Signed   By: Lovey Newcomer M.D.   On: 06/23/2015 15:33     Medical decision making- nonspecific abdominal pain, without evidence for acute pancreatitis, urolithiasis, serous bacterial infection or metabolic instability. No indication for additional evaluation, treatment, ED or hospital setting.  Nursing Notes Reviewed/ Care Coordinated Applicable Imaging Reviewed Interpretation of Laboratory Data incorporated into ED treatment  The patient appears reasonably screened and/or stabilized for discharge and I doubt any other medical condition or other [facility] requiring further screening, evaluation, or treatment in the ED at this time prior to discharge.  Plan: Home Medications- Percocet; Home Treatments- rest; return here if the recommended treatment, does not improve the symptoms; Recommended follow up- PCP prn    Medical screening examination/treatment/procedure(s) were conducted as a shared visit with non-physician practitioner(s) and myself.  I  personally evaluated the patient during the encounter  Daleen Bo, MD 06/24/15 [id number]

## 2015-06-23 NOTE — ED Provider Notes (Signed)
CSN: [id number]     Arrival date & time 06/23/15  1256 History   First MD Initiated Contact with Patient 06/23/15 1259     Chief Complaint  Patient presents with  . Flank Pain  . Pancreatitis     (Consider location/radiation/quality/duration/timing/severity/associated sxs/prior Treatment) HPI Comments: Brent Rogers is a 49 y.o M with a pmhx of chronic pancreatitis S/P whipple procedure 2015 percent to the emergency department today complaining of abdominal pain, vomiting, weakness. Patient states that 2 days ago he began feeling abdominal pain in his left side that has remained constant. Pain is 8 out of 10. Patient has associated vomiting stating that he cannot keep anything down. Patient feels "very dehydrated" and generally weak. Poor by mouth intake for 2 days. Also complaining of intermittent diarrhea for 2 days. Patient states "this feels like my pancreas but worse there might be something else going on". Denies bright red blood but endorses black stool. No history of GI bleed. Denies fever, chills, chest pain, shortness of breath, back pain, dysuria, hematuria, hematemesis, numbness.  The history is provided by the patient.    Past Medical History  Diagnosis Date  . Scoliosis   . Pancreatic abnormality     CT  shows mass  . Chronic pancreatitis     S/P Whipple 05/2014/notes 11/20/2014  . Hypertension    Past Surgical History  Procedure Laterality Date  . Lumbar disc surgery  1990's  . Finger fracture surgery Left 1995    5th digit  . Eus N/A 09/21/2013    Procedure: ESOPHAGEAL ENDOSCOPIC ULTRASOUND (EUS) RADIAL;  Surgeon: Beryle Beams, MD;  Location: [facility] END[facility];  Service: Endoscopy;  Laterality: N/A;  . Fine needle aspiration N/A 09/21/2013    Procedure: FINE NEEDLE ASPIRATION (FNA) LINEAR;  Surgeon: Beryle Beams, MD;  Location: [facility] ENDOSCOPY;  Service: Endoscopy;  Laterality: N/A;  . Eus N/A 09/30/2013    Procedure: UPPER ENDOSCOPIC ULTRASOUND (EUS) LINEAR;  Surgeon:  Beryle Beams, MD;  Location: [facility] ENDOSCOPY;  Service: Endoscopy;  Laterality: N/A;  . Laparoscopy N/A 12/01/2013    Procedure: LAPAROSCOPY DIAGNOSTIC PANCREATICODUODENECTOMY WITH BILIARY AND PANCREATIC STENTS;  Surgeon: Stark Klein, MD;  Location: [facility] ORS;  Service: General;  Laterality: N/A;  . Whipple procedure    . Fracture surgery    . Back surgery     Family History  Problem Relation Age of Onset  . Cancer Mother     unsure  . Hypertension Father    Social History  Substance Use Topics  . Smoking status: Current Every Day Smoker -- 0.25 packs/day for 31 years    Types: Cigarettes  . Smokeless tobacco: Former Systems developer    Types: Snuff     Comment: Smoking 3 cigs/day  . Alcohol Use: 1.2 oz/week    2 Cans of beer per week    Review of Systems  All other systems reviewed and are negative.     Allergies  Review of patient's allergies indicates no known allergies.  Home Medications   Prior to Admission medications   Medication Sig Start Date End Date Taking? Authorizing Provider  acetaminophen (TYLENOL) 500 MG tablet Take 500 mg by mouth every 6 (six) hours as needed for moderate pain (stomach pain).    Historical Provider, MD  amLODipine (NORVASC) 2.5 MG tablet Take 1 tablet (2.5 mg total) by mouth daily. Patient taking differently: Take 5 mg by mouth daily.  04/25/15   Lorayne Marek, MD  bismuth subsalicylate (PEPTO BISMOL) 262 MG  chewable tablet Chew 524 mg by mouth 3 (three) times daily as needed for indigestion.    Historical Provider, MD  ibuprofen (ADVIL,MOTRIN) 600 MG tablet Take 1 tablet (600 mg total) by mouth every 8 (eight) hours as needed. Patient not taking: Reported on 05/21/2015 05/18/15   [name], MD  naproxen (NAPROSYN) 500 MG tablet Take 1 tablet (500 mg total) by mouth 2 (two) times daily with a meal. Patient not taking: Reported on 05/21/2015 04/10/15   Lorayne Marek, MD  ondansetron (ZOFRAN ODT) 8 MG disintegrating tablet Take 1 tablet (8 mg total) by mouth  every 8 (eight) hours as needed for nausea or vomiting. 05/18/15   [name], MD  Pancrelipase, Lip-Prot-Amyl, 24000 UNITS CPEP Take 1 capsule (24,000 Units total) by mouth at bedtime. 12/27/14   Tresa Garter, MD  promethazine (PHENERGAN) 25 MG tablet Take 1 tablet (25 mg total) by mouth every 6 (six) hours as needed for nausea or vomiting. 04/06/15   Orpah Greek, MD  Vitamin D, Ergocalciferol, (DRISDOL) 50000 UNITS CAPS capsule Take 1 capsule (50,000 Units total) by mouth every 7 (seven) days. 04/25/15   Lorayne Marek, MD   BP 122/85 mmHg  Pulse 101  Temp(Src) 98.4 F (36.9 C) (Oral)  Resp 16  SpO2 100% Physical Exam  Constitutional: He is oriented to person, place, and time. He appears well-developed and well-nourished. No distress.  HENT:  Head: Normocephalic and atraumatic.  Mouth/Throat: Oropharynx is clear and moist. No oropharyngeal exudate.  Eyes: Conjunctivae and EOM are normal. Pupils are equal, round, and reactive to light. Right eye exhibits no discharge. Left eye exhibits no discharge. No scleral icterus.  Neck: Normal range of motion.  No meningismus  Cardiovascular: Normal rate, regular rhythm, normal heart sounds and intact distal pulses.  Exam reveals no gallop and no friction rub.   No murmur heard. Pulmonary/Chest: Effort normal and breath sounds normal. No respiratory distress. He has no wheezes. He has no rales. He exhibits no tenderness.  Abdominal: Soft. Bowel sounds are normal. He exhibits no distension and no mass. There is tenderness. There is no rebound and no guarding.  Exquisite TTP of LLQ and left flank. No CVA tenderness.  Musculoskeletal: Normal range of motion. He exhibits no edema or tenderness.  Lymphadenopathy:    He has no cervical adenopathy.  Neurological: He is alert and oriented to person, place, and time.  Strength 5/5 throughout. No sensory deficits.    Skin: Skin is warm and dry. No rash noted. He is not diaphoretic. No  erythema. No pallor.  Psychiatric: He has a normal mood and affect. His behavior is normal.  Nursing note and vitals reviewed.   ED Course  Procedures (including critical care time) Labs Review Labs Reviewed  CBC WITH DIFFERENTIAL/PLATELET - Abnormal; Notable for the following:    WBC 11.6 (*)    Neutro Abs 9.7 (*)    All other components within normal limits  I-STAT CG4 LACTIC ACID, ED - Abnormal; Notable for the following:    Lactic Acid, Venous 2.48 (*)    All other components within normal limits  URINALYSIS, ROUTINE W REFLEX MICROSCOPIC (NOT AT [facility])  COMPREHENSIVE METABOLIC PANEL  LIPASE, BLOOD    Imaging Review No results found. I have personally reviewed and evaluated these images and lab results as part of my medical decision-making.   EKG Interpretation None      MDM   Final diagnoses:  Flank pain    Will fluid resuscitate with  2 L fluids plus continuous. Also given Zofran and morphine. Will CT with stone study to rule out kidney stone. Suspect acute on chronic pancreatitis, given clinical exam and story.  Lactate 2.48. Possibly due to dehydration. Vital signs stable.  Pt symptomatically feels better after fluids, pain meds, and nausea medication.  Pending labs and CT imaging. If within normal limits patient may go home. However patient may require admission if labs reveal significant abnormality.  Pt signed out to Dr. Eulis Wearing at shift change.     Dondra Spry Huckabay, PA-C 06/23/15 1532  Sherwood Gambler, MD 06/26/15 1623

## 2015-06-23 NOTE — ED Notes (Signed)
Pt st's no change in pain after meds.  Requesting [place] sandwich

## 2015-06-23 NOTE — ED Notes (Signed)
To ED from home with c/o left flank pain- hx of pancreatitis -- has had a WHIPPLE PROCEDURE March 2015. Pt has had chills and sweats at home, no temp reported .

## 2015-06-24 MED ORDER — OXYCODONE-ACETAMINOPHEN 5-325 MG PO TABS: 1.0000 | ORAL_TABLET | ORAL | Status: DC | PRN

## 2015-06-24 NOTE — ED Notes (Signed)
Pt stable, ambulatory, states understanding of discharge instructions 

## 2015-06-24 NOTE — Discharge Instructions (Signed)

## 2015-07-10 ENCOUNTER — Encounter: Payer: Self-pay | Admitting: Family Medicine

## 2015-07-10 ENCOUNTER — Ambulatory Visit: Payer: Self-pay | Attending: Family Medicine | Admitting: Family Medicine

## 2015-07-10 VITALS — BP 118/80 | HR 63 | Temp 99.0°F | Resp 16 | Ht 75.0 in | Wt 154.0 lb

## 2015-07-10 DIAGNOSIS — F17200 Nicotine dependence, unspecified, uncomplicated: Secondary | ICD-10-CM

## 2015-07-10 DIAGNOSIS — K861 Other chronic pancreatitis: Secondary | ICD-10-CM | POA: Insufficient documentation

## 2015-07-10 DIAGNOSIS — IMO0002 Reserved for concepts with insufficient information to code with codable children: Secondary | ICD-10-CM

## 2015-07-10 DIAGNOSIS — Z720 Tobacco use: Secondary | ICD-10-CM

## 2015-07-10 DIAGNOSIS — I10 Essential (primary) hypertension: Secondary | ICD-10-CM

## 2015-07-10 DIAGNOSIS — IMO0001 Reserved for inherently not codable concepts without codable children: Secondary | ICD-10-CM

## 2015-07-10 DIAGNOSIS — Z9049 Acquired absence of other specified parts of digestive tract: Secondary | ICD-10-CM

## 2015-07-10 DIAGNOSIS — F17210 Nicotine dependence, cigarettes, uncomplicated: Secondary | ICD-10-CM | POA: Insufficient documentation

## 2015-07-10 DIAGNOSIS — K859 Acute pancreatitis without necrosis or infection, unspecified: Secondary | ICD-10-CM

## 2015-07-10 DIAGNOSIS — Z0000 Encounter for general adult medical examination without abnormal findings: Secondary | ICD-10-CM

## 2015-07-10 MED ORDER — VARENICLINE TARTRATE 0.5 MG X 11 & 1 MG X 42 PO MISC: ORAL | Status: DC

## 2015-07-10 MED ORDER — PROMETHAZINE HCL 25 MG PO TABS: 25.0000 mg | ORAL_TABLET | Freq: Four times a day (QID) | ORAL | Status: DC | PRN

## 2015-07-10 MED ORDER — PROMETHAZINE HCL 25 MG PO TABS: 25.0000 mg | ORAL_TABLET | Freq: Three times a day (TID) | ORAL | Status: DC | PRN

## 2015-07-10 MED ORDER — AMLODIPINE BESYLATE 2.5 MG PO TABS: 2.5000 mg | ORAL_TABLET | Freq: Every day | ORAL | Status: DC

## 2015-07-10 MED ORDER — VARENICLINE TARTRATE 1 MG PO TABS: 1.0000 mg | ORAL_TABLET | Freq: Two times a day (BID) | ORAL | Status: DC

## 2015-07-10 NOTE — Progress Notes (Signed)
Patient ID: Brent Rogers, male   DOB: [birth year], 49 y.o.   MRN: [id number]   Subjective:  Patient ID: Brent Rogers, male    DOB: [birth year]  Age: 49 y.o. MRN: [id number]  CC: Hypertension   HPI Brent Rogers presents for   1. CHRONIC HYPERTENSION  Disease Monitoring  Blood pressure range:   Chest pain: no   Dyspnea: no   Claudication: no   Medication compliance: yes  Medication Side Effects  Lightheadedness: no   Urinary frequency: no   Edema: no   Impotence: no   Preventitive Healthcare:  Exercise: no     2. Recurrent pancreatitis: since Whipple procedure in 11/2013. Patient have N/V/D abdominal pain. Sometimes with inflamed pancrease confirmed on CT scan. Patient also with weight loss. He denies ETOH. He has normal TGs. He is s/p cholecystectomy.   3. Smoker: x 30 years. Chewing and cigarettes. Would like to quit. Has not tried chantix or patch. No SI or depression.   Social History  Substance Use Topics  . Smoking status: Current Every Day Smoker -- 0.25 packs/day for 31 years    Types: Cigarettes  . Smokeless tobacco: Former Systems developer    Types: Snuff     Comment: Smoking 3 cigs/day  . Alcohol Use: 1.2 oz/week    2 Cans of beer per week    Outpatient Prescriptions Prior to Visit  Medication Sig Dispense Refill  . acetaminophen (TYLENOL) 500 MG tablet Take 500 mg by mouth every 6 (six) hours as needed for moderate pain (stomach pain).    Marland Kitchen amLODipine (NORVASC) 2.5 MG tablet Take 1 tablet (2.5 mg total) by mouth daily. (Patient taking differently: Take 5 mg by mouth daily. ) 30 tablet 2  . oxyCODONE-acetaminophen (PERCOCET) 5-325 MG per tablet Take 1 tablet by mouth every 4 (four) hours as needed for severe pain. 15 tablet 0  . Pancrelipase, Lip-Prot-Amyl, 24000 UNITS CPEP Take 1 capsule (24,000 Units total) by mouth at bedtime. 90 capsule 3  . Vitamin D, Ergocalciferol, (DRISDOL) 50000 UNITS CAPS capsule Take 1 capsule (50,000 Units total) by mouth every 7  (seven) days. 12 capsule 0  . bismuth subsalicylate (PEPTO BISMOL) 262 MG chewable tablet Chew 524 mg by mouth 3 (three) times daily as needed for indigestion.    Marland Kitchen ibuprofen (ADVIL,MOTRIN) 600 MG tablet Take 1 tablet (600 mg total) by mouth every 8 (eight) hours as needed. (Patient not taking: Reported on 05/21/2015) 15 tablet 0  . naproxen (NAPROSYN) 500 MG tablet Take 1 tablet (500 mg total) by mouth 2 (two) times daily with a meal. (Patient not taking: Reported on 07/10/2015) 60 tablet 1  . ondansetron (ZOFRAN ODT) 8 MG disintegrating tablet Take 1 tablet (8 mg total) by mouth every 8 (eight) hours as needed for nausea or vomiting. (Patient not taking: Reported on 07/10/2015) 12 tablet 0  . promethazine (PHENERGAN) 25 MG tablet Take 1 tablet (25 mg total) by mouth every 6 (six) hours as needed for nausea or vomiting. (Patient not taking: Reported on 07/10/2015) 30 tablet 0   No facility-administered medications prior to visit.    ROS Review of Systems  Constitutional: Positive for unexpected weight change. Negative for fever, chills and fatigue.  Eyes: Negative for visual disturbance.  Respiratory: Negative for cough and shortness of breath.   Cardiovascular: Negative for chest pain, palpitations and leg swelling.  Gastrointestinal: Positive for abdominal pain and diarrhea. Negative for nausea, vomiting, constipation and blood in stool.  Endocrine: Negative for  polydipsia, polyphagia and polyuria.  Musculoskeletal: Negative for myalgias, back pain, arthralgias, gait problem and neck pain.  Skin: Negative for rash.  Allergic/Immunologic: Negative for immunocompromised state.  Hematological: Negative for adenopathy. Does not bruise/bleed easily.  Psychiatric/Behavioral: Negative for suicidal ideas, sleep disturbance and dysphoric mood. The patient is not nervous/anxious.     Objective:  BP 118/80 mmHg  Pulse 63  Temp(Src) 99 F (37.2 C) (Oral)  Resp 16  Ht 6\' 3"  (1.905 m)  Wt 154 lb  (69.854 kg)  BMI 19.25 kg/m2  SpO2 100%  BP/Weight 07/10/2015 06/24/2015 05/16/5630  Systolic BP 497 026 378  Diastolic BP 80 68 75  Wt. (Lbs) 154 - 144.6  BMI 19.25 - 18.07   Physical Exam  Constitutional: He appears well-developed and well-nourished. No distress.  Thin African-American male   HENT:  Head: Normocephalic and atraumatic.  Neck: Normal range of motion. Neck supple.  Cardiovascular: Normal rate, regular rhythm, normal heart sounds and intact distal pulses.   Pulmonary/Chest: Effort normal and breath sounds normal.  Musculoskeletal: He exhibits no edema.  Neurological: He is alert.  Skin: Skin is warm and dry. No rash noted. No erythema.  Psychiatric: He has a normal mood and affect.     Assessment & Plan:   Problem List Items Addressed This Visit    HTN (hypertension) (Chronic)   Relevant Medications   amLODipine (NORVASC) 2.5 MG tablet   Recurrent pancreatitis (HCC) (Chronic)   Relevant Medications   promethazine (PHENERGAN) 25 MG tablet   Other Relevant Orders   Ambulatory referral to Gastroenterology   S/P cholecystectomy (Chronic)   Smoking (Chronic)   Relevant Medications   varenicline (CHANTIX CONTINUING MONTH PAK) 1 MG tablet   varenicline (CHANTIX STARTING MONTH PAK) 0.5 MG X 11 & 1 MG X 42 tablet    Other Visit Diagnoses    Healthcare maintenance    -  Primary    Relevant Orders    Flu Vaccine QUAD 36+ mos IM (Completed)    Ambulatory referral to Gastroenterology    Healthcare maintenance   (Active)      Relevant Orders    Flu Vaccine QUAD 36+ mos IM (Completed)    Ambulatory referral to Gastroenterology       No orders of the defined types were placed in this encounter.    Follow-up: No Follow-up on file.   Boykin Nearing MD

## 2015-07-10 NOTE — Patient Instructions (Addendum)
Brent Rogers was seen today for hypertension.  Diagnoses and all orders for this visit:  Healthcare maintenance -     Flu Vaccine QUAD 36+ mos IM -     Ambulatory referral to Gastroenterology  Smoking -     varenicline (CHANTIX CONTINUING MONTH PAK) 1 MG tablet; Take 1 tablet (1 mg total) by mouth 2 (two) times daily. -     varenicline (CHANTIX STARTING MONTH PAK) 0.5 MG X 11 & 1 MG X 42 tablet; Taper per packet insert  Essential hypertension -     amLODipine (NORVASC) 2.5 MG tablet; Take 1 tablet (2.5 mg total) by mouth daily.  Recurrent pancreatitis (HCC) -     promethazine (PHENERGAN) 25 MG tablet; Take 1 tablet (25 mg total) by mouth every 6 (six) hours as needed for nausea or vomiting. -     Ambulatory referral to Gastroenterology  Healthcare maintenance -     Flu Vaccine QUAD 36+ mos IM -     Ambulatory referral to Gastroenterology  S/P cholecystectomy   Smoking cessation support: smoking cessation hotline: 1-800-QUIT-NOW.  Smoking cessation classes are available through [facility]. Call [phone] or visit our website at [email or web address]   F/u in 6 weeks for smoking cessation  Dr. Adrian Blackwater

## 2015-07-10 NOTE — Progress Notes (Signed)
F/U HTN HFU due to elevated BP elevated hr Hx tobacco OCC No pain today

## 2015-07-23 ENCOUNTER — Ambulatory Visit: Payer: Self-pay | Attending: Family Medicine

## 2015-07-30 ENCOUNTER — Ambulatory Visit: Payer: Self-pay

## 2015-08-06 ENCOUNTER — Ambulatory Visit: Payer: Self-pay | Attending: Family Medicine

## 2015-09-19 ENCOUNTER — Encounter: Payer: Self-pay | Admitting: Family Medicine

## 2015-09-19 ENCOUNTER — Ambulatory Visit: Payer: Self-pay | Attending: Family Medicine | Admitting: Family Medicine

## 2015-09-19 VITALS — BP 120/85 | HR 93 | Temp 98.8°F | Resp 16 | Ht 75.0 in | Wt 153.0 lb

## 2015-09-19 DIAGNOSIS — K0889 Other specified disorders of teeth and supporting structures: Secondary | ICD-10-CM | POA: Insufficient documentation

## 2015-09-19 MED ORDER — AMOXICILLIN 500 MG PO CAPS: 500.0000 mg | ORAL_CAPSULE | Freq: Three times a day (TID) | ORAL | Status: DC

## 2015-09-19 MED ORDER — TRAMADOL HCL 50 MG PO TABS
50.0000 mg | ORAL_TABLET | Freq: Three times a day (TID) | ORAL | Status: DC | PRN
Start: 2015-09-19 — End: 2015-10-15

## 2015-09-19 NOTE — Progress Notes (Signed)
Patients here for abscess on left side of face since x3 days ago. Facial swelling with pain, patient rates pain at 10/10. Pain is constant.  Patient he's been having fever and chills.  Patient requesting med refill.

## 2015-09-19 NOTE — Progress Notes (Signed)
Subjective:  Patient ID: SIVA LIVAUDAIS, male    DOB: [birth year]  Age: 49 y.o. MRN: [id number]  CC: Abscess   HPI DEMIAN VOGELE is a 49 year old male with a three-day history of left upper premolar toothache which is rated as 10/10 with associated swelling of the left side of his face. He has been taking OTC analgesics with no relief in symptoms. He denies fever or chills. He has not been to see a dentist recently  Outpatient Prescriptions Prior to Visit  Medication Sig Dispense Refill  . acetaminophen (TYLENOL) 500 MG tablet Take 500 mg by mouth every 6 (six) hours as needed for moderate pain (stomach pain).    Marland Kitchen amLODipine (NORVASC) 2.5 MG tablet Take 1 tablet (2.5 mg total) by mouth daily. 30 tablet 2  . oxyCODONE-acetaminophen (PERCOCET) 5-325 MG per tablet Take 1 tablet by mouth every 4 (four) hours as needed for severe pain. 15 tablet 0  . Pancrelipase, Lip-Prot-Amyl, 24000 UNITS CPEP Take 1 capsule (24,000 Units total) by mouth at bedtime. 90 capsule 3  . promethazine (PHENERGAN) 25 MG tablet Take 1 tablet (25 mg total) by mouth every 8 (eight) hours as needed for nausea or vomiting. 30 tablet 1  . varenicline (CHANTIX CONTINUING MONTH PAK) 1 MG tablet Take 1 tablet (1 mg total) by mouth 2 (two) times daily. 60 tablet 1  . varenicline (CHANTIX STARTING MONTH PAK) 0.5 MG X 11 & 1 MG X 42 tablet Taper per packet insert 53 tablet 0  . Vitamin D, Ergocalciferol, (DRISDOL) 50000 UNITS CAPS capsule Take 1 capsule (50,000 Units total) by mouth every 7 (seven) days. 12 capsule 0   No facility-administered medications prior to visit.    ROS Review of Systems  Constitutional: Negative for activity change and appetite change.  HENT: Positive for dental problem and facial swelling.   Eyes: Negative for visual disturbance.  Respiratory: Negative for cough, chest tightness and shortness of breath.   Cardiovascular: Negative for chest pain and leg swelling.  Gastrointestinal: Negative  for abdominal pain, diarrhea, constipation and abdominal distention.  Endocrine: Negative.   Genitourinary: Negative for dysuria.  Musculoskeletal: Negative for myalgias and joint swelling.  Skin: Negative for rash.  Allergic/Immunologic: Negative.   Neurological: Negative for weakness, light-headedness and numbness.  Psychiatric/Behavioral: Negative for suicidal ideas and dysphoric mood.    Objective:  BP 120/85 mmHg  Pulse 93  Temp(Src) 98.8 F (37.1 C) (Oral)  Resp 16  Ht 6\' 3"  (1.905 m)  Wt 153 lb (69.4 kg)  BMI 19.12 kg/m2  SpO2 97%  BP/Weight 09/19/2015 07/10/2015 [id number]  Systolic BP 123456 123456 [id number]  Diastolic BP 85 80 68  Wt. (Lbs) 153 154 -  BMI 19.12 19.25 -      Physical Exam  Constitutional: He is oriented to person, place, and time. He appears well-developed and well-nourished.  HENT:  Left sided cheek edema Left upper premolar abscess in place, severely tender.  Cardiovascular: Normal rate, normal heart sounds and intact distal pulses.   No murmur heard. Pulmonary/Chest: Effort normal and breath sounds normal. He has no wheezes. He has no rales. He exhibits no tenderness.  Abdominal: Soft. Bowel sounds are normal. He exhibits no distension and no mass. There is no tenderness.  Musculoskeletal: Normal range of motion.  Neurological: He is alert and oriented to person, place, and time.     Assessment & Plan:   1. Tooth ache - traMADol (ULTRAM) 50 MG tablet; Take 1 tablet (50 mg total)  by mouth every 8 (eight) hours as needed.  Dispense: 30 tablet; Refill: 0 - amoxicillin (AMOXIL) 500 MG capsule; Take 1 capsule (500 mg total) by mouth 3 (three) times daily.  Dispense: 30 capsule; Refill: 0 - Ambulatory referral to Dentistry   Meds ordered this encounter  Medications  . traMADol (ULTRAM) 50 MG tablet    Sig: Take 1 tablet (50 mg total) by mouth every 8 (eight) hours as needed.    Dispense:  30 tablet    Refill:  0  . amoxicillin (AMOXIL) 500 MG  capsule    Sig: Take 1 capsule (500 mg total) by mouth 3 (three) times daily.    Dispense:  30 capsule    Refill:  0    Follow-up: Return in about 1 month (around 10/20/2015), or if symptoms worsen or fail to improve, for follow up of hypertension with PCP- Dr Adrian Blackwater.   Arnoldo Morale MD

## 2015-10-15 ENCOUNTER — Ambulatory Visit: Payer: Self-pay | Attending: Family Medicine | Admitting: Family Medicine

## 2015-10-15 ENCOUNTER — Encounter: Payer: Self-pay | Admitting: Family Medicine

## 2015-10-15 VITALS — BP 120/85 | HR 107 | Temp 98.7°F | Resp 16 | Ht 75.0 in | Wt 151.0 lb

## 2015-10-15 DIAGNOSIS — K8681 Exocrine pancreatic insufficiency: Secondary | ICD-10-CM

## 2015-10-15 DIAGNOSIS — Z79899 Other long term (current) drug therapy: Secondary | ICD-10-CM | POA: Insufficient documentation

## 2015-10-15 DIAGNOSIS — K861 Other chronic pancreatitis: Secondary | ICD-10-CM | POA: Insufficient documentation

## 2015-10-15 DIAGNOSIS — Z681 Body mass index (BMI) 19 or less, adult: Secondary | ICD-10-CM | POA: Insufficient documentation

## 2015-10-15 DIAGNOSIS — Z23 Encounter for immunization: Secondary | ICD-10-CM

## 2015-10-15 DIAGNOSIS — R197 Diarrhea, unspecified: Secondary | ICD-10-CM | POA: Insufficient documentation

## 2015-10-15 DIAGNOSIS — Z0000 Encounter for general adult medical examination without abnormal findings: Secondary | ICD-10-CM

## 2015-10-15 DIAGNOSIS — I10 Essential (primary) hypertension: Secondary | ICD-10-CM | POA: Insufficient documentation

## 2015-10-15 DIAGNOSIS — Z114 Encounter for screening for human immunodeficiency virus [HIV]: Secondary | ICD-10-CM

## 2015-10-15 DIAGNOSIS — F17210 Nicotine dependence, cigarettes, uncomplicated: Secondary | ICD-10-CM | POA: Insufficient documentation

## 2015-10-15 DIAGNOSIS — K859 Acute pancreatitis without necrosis or infection, unspecified: Secondary | ICD-10-CM

## 2015-10-15 LAB — COMPLETE METABOLIC PANEL WITH GFR
ALT: 22 U/L (ref 9–46)
AST: 44 U/L — ABNORMAL HIGH (ref 10–40)
Albumin: 4.4 g/dL (ref 3.6–5.1)
Alkaline Phosphatase: 124 U/L — ABNORMAL HIGH (ref 40–115)
BUN: 7 mg/dL (ref 7–25)
CO2: 28 mmol/L (ref 20–31)
Calcium: 9.9 mg/dL (ref 8.6–10.3)
Chloride: 103 mmol/L (ref 98–110)
Creat: 0.91 mg/dL (ref 0.60–1.35)
GFR, Est African American: >89 mL/min (ref >=60)
GFR, Est Non African American: >89 mL/min (ref >=60)
Glucose, Bld: 102 mg/dL — ABNORMAL HIGH (ref 65–99)
Potassium: 4.9 mmol/L (ref 3.5–5.3)
Sodium: 136 mmol/L (ref 135–146)
Total Bilirubin: 1 mg/dL (ref 0.2–1.2)
Total Protein: 7 g/dL (ref 6.1–8.1)

## 2015-10-15 LAB — CBC
HCT: 46.4 % (ref 39.0–52.0)
Hemoglobin: 15.3 g/dL (ref 13.0–17.0)
MCH: 28.6 pg (ref 26.0–34.0)
MCHC: 33 g/dL (ref 30.0–36.0)
MCV: 86.7 fL (ref 78.0–100.0)
MPV: 10.9 fL (ref 8.6–12.4)
Platelets: 250 10*3/uL (ref 150–400)
RBC: 5.35 MIL/uL (ref 4.22–5.81)
RDW: 14.7 % (ref 11.5–15.5)
WBC: 7.4 10*3/uL (ref 4.0–10.5)

## 2015-10-15 LAB — LIPASE: Lipase: 28 U/L (ref 7–60)

## 2015-10-15 LAB — POCT GLYCOSYLATED HEMOGLOBIN (HGB A1C): Hemoglobin A1C: 5.6

## 2015-10-15 MED ORDER — AMLODIPINE BESYLATE 2.5 MG PO TABS: 2.5000 mg | ORAL_TABLET | Freq: Every day | ORAL | Status: DC

## 2015-10-15 MED ORDER — PROMETHAZINE HCL 25 MG PO TABS: 25.0000 mg | ORAL_TABLET | Freq: Three times a day (TID) | ORAL | Status: DC | PRN

## 2015-10-15 MED ORDER — PANCRELIPASE (LIP-PROT-AMYL) 24000-76000 UNITS PO CPEP: 24000.0000 [IU] | ORAL_CAPSULE | Freq: Every day | ORAL | Status: DC

## 2015-10-15 NOTE — Progress Notes (Signed)
Subjective:  Patient ID: Brent Rogers, male    DOB: [birth year]  Age: 50 y.o. MRN: [id number]  CC: Hypertension   HPI LUISMARIO RENZ presents for    1. CHRONIC HYPERTENSION  Disease Monitoring  Blood pressure range: not checking   Chest pain: no   Dyspnea: no   Claudication: no   Medication compliance: yes  Medication Side Effects  Lightheadedness: no   Urinary frequency: no   Edema: no     2. Diarrhea: x 2 days. Non-bloody. No new food. No abdominal pain, nausea, emesis, fever. No recent travel. Ate sister's meatloaf.   Social History  Substance Use Topics  . Smoking status: Current Every Day Smoker -- 0.25 packs/day for 31 years    Types: Cigarettes  . Smokeless tobacco: Former Systems developer    Types: Snuff     Comment: Smoking 3 cigs/day  . Alcohol Use: 1.2 oz/week    2 Cans of beer per week    Outpatient Prescriptions Prior to Visit  Medication Sig Dispense Refill  . acetaminophen (TYLENOL) 500 MG tablet Take 500 mg by mouth every 6 (six) hours as needed for moderate pain (stomach pain).    Marland Kitchen amLODipine (NORVASC) 2.5 MG tablet Take 1 tablet (2.5 mg total) by mouth daily. 30 tablet 2  . amoxicillin (AMOXIL) 500 MG capsule Take 1 capsule (500 mg total) by mouth 3 (three) times daily. 30 capsule 0  . oxyCODONE-acetaminophen (PERCOCET) 5-325 MG per tablet Take 1 tablet by mouth every 4 (four) hours as needed for severe pain. 15 tablet 0  . Pancrelipase, Lip-Prot-Amyl, 24000 UNITS CPEP Take 1 capsule (24,000 Units total) by mouth at bedtime. 90 capsule 3  . promethazine (PHENERGAN) 25 MG tablet Take 1 tablet (25 mg total) by mouth every 8 (eight) hours as needed for nausea or vomiting. 30 tablet 1  . traMADol (ULTRAM) 50 MG tablet Take 1 tablet (50 mg total) by mouth every 8 (eight) hours as needed. 30 tablet 0  . varenicline (CHANTIX CONTINUING MONTH PAK) 1 MG tablet Take 1 tablet (1 mg total) by mouth 2 (two) times daily. 60 tablet 1  . varenicline (CHANTIX STARTING  MONTH PAK) 0.5 MG X 11 & 1 MG X 42 tablet Taper per packet insert 53 tablet 0  . Vitamin D, Ergocalciferol, (DRISDOL) 50000 UNITS CAPS capsule Take 1 capsule (50,000 Units total) by mouth every 7 (seven) days. 12 capsule 0   No facility-administered medications prior to visit.    ROS Review of Systems  Constitutional: Negative for fever, chills, fatigue and unexpected weight change.  Eyes: Negative for visual disturbance.  Respiratory: Negative for cough and shortness of breath.   Cardiovascular: Negative for chest pain, palpitations and leg swelling.  Gastrointestinal: Positive for diarrhea. Negative for nausea, vomiting, abdominal pain, constipation and blood in stool.  Endocrine: Negative for polydipsia, polyphagia and polyuria.  Musculoskeletal: Negative for myalgias, back pain, arthralgias, gait problem and neck pain.  Skin: Negative for rash.  Allergic/Immunologic: Negative for immunocompromised state.  Hematological: Negative for adenopathy. Does not bruise/bleed easily.  Psychiatric/Behavioral: Negative for suicidal ideas, sleep disturbance and dysphoric mood. The patient is not nervous/anxious.     Objective:  BP 120/85 mmHg  Pulse 107  Temp(Src) 98.7 F (37.1 C) (Oral)  Resp 16  Ht 6\' 3"  (1.905 m)  Wt 151 lb (68.493 kg)  BMI 18.87 kg/m2  SpO2 99%  BP/Weight 10/15/2015 09/19/2015 [id number]  Systolic BP 123456 123456 123456  Diastolic BP 85 85 80  Wt. (Lbs) 151 153 154  BMI 18.87 19.12 19.25   Physical Exam  Constitutional: He appears well-developed and well-nourished. No distress.  HENT:  Head: Normocephalic and atraumatic.  Neck: Normal range of motion. Neck supple.  Cardiovascular: Normal rate, regular rhythm, normal heart sounds and intact distal pulses.   Pulmonary/Chest: Effort normal and breath sounds normal.  Musculoskeletal: He exhibits no edema.  Neurological: He is alert.  Skin: Skin is warm and dry. No rash noted. No erythema.  Psychiatric: He has a normal  mood and affect.    Assessment & Plan:   Problem List Items Addressed This Visit    Diarrhea    For diarrhea suspect viral, r/o pancreatitis   Drink plenty of water, gatorade  Continue pepto bismol Call if you develop fever or blood in stool       Relevant Orders   CBC   Lipase   COMPLETE METABOLIC PANEL WITH GFR   Exocrine pancreatic insufficiency (HCC) (Chronic)   Relevant Medications   Pancrelipase, Lip-Prot-Amyl, 24000 units CPEP   HTN (hypertension) (Chronic)    A: BP well controlled Med: compliant P: refilled norvasc 2.5 mg daily       Relevant Medications   amLODipine (NORVASC) 2.5 MG tablet   Recurrent pancreatitis (HCC) (Chronic)   Relevant Medications   promethazine (PHENERGAN) 25 MG tablet   Pancrelipase, Lip-Prot-Amyl, 24000 units CPEP    Other Visit Diagnoses    Healthcare maintenance    -  Primary    Relevant Orders    POCT glycosylated hemoglobin (Hb A1C) (Completed)    Screening for HIV (human immunodeficiency virus)        Relevant Orders    HIV antibody (with reflex)       No orders of the defined types were placed in this encounter.    Follow-up: No Follow-up on file.   Boykin Nearing MD

## 2015-10-15 NOTE — Assessment & Plan Note (Signed)
A: BP well controlled Med: compliant P: refilled norvasc 2.5 mg daily

## 2015-10-15 NOTE — Patient Instructions (Addendum)
Brent Rogers was seen today for hypertension.  Diagnoses and all orders for this visit:  Healthcare maintenance -     POCT glycosylated hemoglobin (Hb A1C)  Essential hypertension -     Discontinue: amLODipine (NORVASC) 2.5 MG tablet; Take 1 tablet (2.5 mg total) by mouth daily. -     amLODipine (NORVASC) 2.5 MG tablet; Take 1 tablet (2.5 mg total) by mouth daily.  Exocrine pancreatic insufficiency (HCC) -     Discontinue: Pancrelipase, Lip-Prot-Amyl, 24000 units CPEP; Take 1 capsule (24,000 Units total) by mouth at bedtime. -     Pancrelipase, Lip-Prot-Amyl, 24000 units CPEP; Take 1 capsule (24,000 Units total) by mouth at bedtime.  Recurrent pancreatitis ([place]) -     Discontinue: promethazine (PHENERGAN) 25 MG tablet; Take 1 tablet (25 mg total) by mouth every 8 (eight) hours as needed for nausea or vomiting. -     promethazine (PHENERGAN) 25 MG tablet; Take 1 tablet (25 mg total) by mouth every 8 (eight) hours as needed for nausea or vomiting.  Screening for HIV (human immunodeficiency virus) -     HIV antibody (with reflex)  Diarrhea, unspecified type -     CBC -     Lipase -     COMPLETE METABOLIC PANEL WITH GFR  Other orders -     Tdap vaccine greater than or equal to 7yo IM   You will be called with lab results  For diarrhea  Drink plenty of water, gatorade  Continue pepto bismol Call if you develop fever or blood in stool  F/u in 6 months for HTN  Dr. Adrian Blackwater

## 2015-10-15 NOTE — Assessment & Plan Note (Signed)
For diarrhea suspect viral, r/o pancreatitis   Drink plenty of water, gatorade  Continue pepto bismol Call if you develop fever or blood in stool

## 2015-10-15 NOTE — Progress Notes (Signed)
F/U HTN  Taking medication medication as prescribed  C/C diarrhea  x 2 days  No pain today  No tobacco user  No suicidal thought in the past two weeks

## 2015-10-16 LAB — HIV ANTIBODY (ROUTINE TESTING W REFLEX): HIV 1&2 Ab, 4th Generation: NONREACTIVE

## 2015-10-17 ENCOUNTER — Telehealth: Payer: Self-pay | Admitting: *Deleted

## 2015-10-17 NOTE — Telephone Encounter (Signed)
-----   Message from Boykin Nearing, MD sent at 10/16/2015  1:41 PM EST ----- WBC and lipase normal Screening HIV negative Continue current care plan Be sure to avoid alcohol, AST slightly elevated

## 2015-10-17 NOTE — Telephone Encounter (Signed)
Date of birth verified by pt Lab results given, Normal Lipase WBC neg HIV Advised to avoid ETOH since AST lightly elevated  Continue taking medication as prescribed  Pt verbalized understanding

## 2015-10-29 MED FILL — PROMETHAZINE 25 MG TABLET: 25 | 10 days supply | Qty: 30 | Fill #0

## 2015-10-29 MED FILL — ?AMLODIPINE BESYLATE 5 MG T: 5 | 30 days supply | Qty: 15 | Fill #0

## 2015-11-10 ENCOUNTER — Encounter (HOSPITAL_COMMUNITY): Payer: Self-pay | Admitting: Emergency Medicine

## 2015-11-10 ENCOUNTER — Emergency Department (HOSPITAL_COMMUNITY)
Admission: EM | Admit: 2015-11-10 | Discharge: 2015-11-10 | Disposition: A | Discharge status: Home / Self Care | Payer: Self-pay | Attending: Emergency Medicine | Admitting: Emergency Medicine

## 2015-11-10 DIAGNOSIS — R079 Chest pain, unspecified: Secondary | ICD-10-CM | POA: Insufficient documentation

## 2015-11-10 DIAGNOSIS — I10 Essential (primary) hypertension: Secondary | ICD-10-CM | POA: Insufficient documentation

## 2015-11-10 DIAGNOSIS — F17210 Nicotine dependence, cigarettes, uncomplicated: Secondary | ICD-10-CM | POA: Insufficient documentation

## 2015-11-10 DIAGNOSIS — R109 Unspecified abdominal pain: Secondary | ICD-10-CM | POA: Insufficient documentation

## 2015-11-10 LAB — CBC
HCT: 48.7 % (ref 39.0–52.0)
Hemoglobin: 16.4 g/dL (ref 13.0–17.0)
MCH: 28.9 pg (ref 26.0–34.0)
MCHC: 33.7 g/dL (ref 30.0–36.0)
MCV: 85.9 fL (ref 78.0–100.0)
Platelets: 294 10*3/uL (ref 150–400)
RBC: 5.67 MIL/uL (ref 4.22–5.81)
RDW: 14.8 % (ref 11.5–15.5)
WBC: 10 10*3/uL (ref 4.0–10.5)

## 2015-11-10 LAB — BASIC METABOLIC PANEL
Anion gap: 17 — ABNORMAL HIGH (ref 5–15)
BUN: <5 mg/dL — ABNORMAL LOW (ref 6–20)
CO2: 21 mmol/L — ABNORMAL LOW (ref 22–32)
Calcium: 9.4 mg/dL (ref 8.9–10.3)
Chloride: 97 mmol/L — ABNORMAL LOW (ref 101–111)
Creatinine, Ser: 0.9 mg/dL (ref 0.61–1.24)
GFR calc Af Amer: >60 mL/min (ref >60)
GFR calc non Af Amer: >60 mL/min (ref >60)
Glucose, Bld: 94 mg/dL (ref 65–99)
Potassium: 4.5 mmol/L (ref 3.5–5.1)
Sodium: 135 mmol/L (ref 135–145)

## 2015-11-10 LAB — I-STAT TROPONIN, ED: Troponin i, poc: 0 ng/mL (ref 0.00–0.08)

## 2015-11-10 NOTE — ED Notes (Signed)
Called the pt again with no answer

## 2015-11-10 NOTE — ED Notes (Signed)
Xray staff has been unable to locate the pt multiple times

## 2015-11-10 NOTE — ED Notes (Signed)
Called pt with no answer x 2 

## 2015-11-10 NOTE — ED Notes (Signed)
No answer from pt in waiting room 

## 2015-11-10 NOTE — ED Notes (Signed)
Pt c/o center chest pain described as burning especially after he eats. Pt also reports history of pancreatitis and feels it is flaring up.

## 2015-11-12 ENCOUNTER — Inpatient Hospital Stay (HOSPITAL_COMMUNITY)
Admission: EM | Admit: 2015-11-12 | Discharge: 2015-11-16 | DRG: 438 | Disposition: A | Discharge status: Home / Self Care | Payer: Self-pay | Attending: Internal Medicine | Admitting: Internal Medicine

## 2015-11-12 ENCOUNTER — Emergency Department (HOSPITAL_COMMUNITY): Payer: Self-pay

## 2015-11-12 ENCOUNTER — Encounter (HOSPITAL_COMMUNITY): Payer: Self-pay | Admitting: *Deleted

## 2015-11-12 DIAGNOSIS — Z9049 Acquired absence of other specified parts of digestive tract: Secondary | ICD-10-CM

## 2015-11-12 DIAGNOSIS — R1110 Vomiting, unspecified: Secondary | ICD-10-CM

## 2015-11-12 DIAGNOSIS — J189 Pneumonia, unspecified organism: Secondary | ICD-10-CM

## 2015-11-12 DIAGNOSIS — M419 Scoliosis, unspecified: Secondary | ICD-10-CM | POA: Diagnosis present

## 2015-11-12 DIAGNOSIS — E872 Acidosis, unspecified: Secondary | ICD-10-CM | POA: Diagnosis present

## 2015-11-12 DIAGNOSIS — Z681 Body mass index (BMI) 19 or less, adult: Secondary | ICD-10-CM

## 2015-11-12 DIAGNOSIS — K760 Fatty (change of) liver, not elsewhere classified: Secondary | ICD-10-CM | POA: Diagnosis present

## 2015-11-12 DIAGNOSIS — Z720 Tobacco use: Secondary | ICD-10-CM | POA: Diagnosis present

## 2015-11-12 DIAGNOSIS — K859 Acute pancreatitis without necrosis or infection, unspecified: Principal | ICD-10-CM | POA: Diagnosis present

## 2015-11-12 DIAGNOSIS — E875 Hyperkalemia: Secondary | ICD-10-CM | POA: Diagnosis present

## 2015-11-12 DIAGNOSIS — R1310 Dysphagia, unspecified: Secondary | ICD-10-CM

## 2015-11-12 DIAGNOSIS — E43 Unspecified severe protein-calorie malnutrition: Secondary | ICD-10-CM | POA: Diagnosis present

## 2015-11-12 DIAGNOSIS — K861 Other chronic pancreatitis: Secondary | ICD-10-CM | POA: Diagnosis present

## 2015-11-12 DIAGNOSIS — E860 Dehydration: Secondary | ICD-10-CM | POA: Diagnosis present

## 2015-11-12 DIAGNOSIS — K219 Gastro-esophageal reflux disease without esophagitis: Secondary | ICD-10-CM | POA: Diagnosis present

## 2015-11-12 DIAGNOSIS — N179 Acute kidney failure, unspecified: Secondary | ICD-10-CM | POA: Diagnosis present

## 2015-11-12 DIAGNOSIS — R109 Unspecified abdominal pain: Secondary | ICD-10-CM | POA: Diagnosis present

## 2015-11-12 DIAGNOSIS — I10 Essential (primary) hypertension: Secondary | ICD-10-CM | POA: Diagnosis present

## 2015-11-12 DIAGNOSIS — F17210 Nicotine dependence, cigarettes, uncomplicated: Secondary | ICD-10-CM | POA: Diagnosis present

## 2015-11-12 DIAGNOSIS — R748 Abnormal levels of other serum enzymes: Secondary | ICD-10-CM | POA: Diagnosis present

## 2015-11-12 DIAGNOSIS — R112 Nausea with vomiting, unspecified: Secondary | ICD-10-CM | POA: Diagnosis present

## 2015-11-12 HISTORY — DX: Pneumonia, unspecified organism: J18.9

## 2015-11-12 HISTORY — DX: Migraine, unspecified, not intractable, without status migrainosus: G43.909

## 2015-11-12 LAB — BASIC METABOLIC PANEL
Anion gap: 22 — ABNORMAL HIGH (ref 5–15)
Anion gap: 16 — ABNORMAL HIGH (ref 5–15)
Anion gap: 13 (ref 5–15)
BUN: 11 mg/dL (ref 6–20)
BUN: 10 mg/dL (ref 6–20)
BUN: 7 mg/dL (ref 6–20)
CO2: 21 mmol/L — ABNORMAL LOW (ref 22–32)
CO2: 21 mmol/L — ABNORMAL LOW (ref 22–32)
CO2: 21 mmol/L — ABNORMAL LOW (ref 22–32)
Calcium: 9.9 mg/dL (ref 8.9–10.3)
Calcium: 8.8 mg/dL — ABNORMAL LOW (ref 8.9–10.3)
Calcium: 8.8 mg/dL — ABNORMAL LOW (ref 8.9–10.3)
Chloride: 92 mmol/L — ABNORMAL LOW (ref 101–111)
Chloride: 99 mmol/L — ABNORMAL LOW (ref 101–111)
Chloride: 100 mmol/L — ABNORMAL LOW (ref 101–111)
Creatinine, Ser: 1.25 mg/dL — ABNORMAL HIGH (ref 0.61–1.24)
Creatinine, Ser: 1.11 mg/dL (ref 0.61–1.24)
Creatinine, Ser: 0.78 mg/dL (ref 0.61–1.24)
GFR calc Af Amer: >60 mL/min (ref >60)
GFR calc Af Amer: >60 mL/min (ref >60)
GFR calc Af Amer: >60 mL/min (ref >60)
GFR calc non Af Amer: >60 mL/min (ref >60)
GFR calc non Af Amer: >60 mL/min (ref >60)
GFR calc non Af Amer: >60 mL/min (ref >60)
Glucose, Bld: 123 mg/dL — ABNORMAL HIGH (ref 65–99)
Glucose, Bld: 106 mg/dL — ABNORMAL HIGH (ref 65–99)
Glucose, Bld: 119 mg/dL — ABNORMAL HIGH (ref 65–99)
Potassium: 4.5 mmol/L (ref 3.5–5.1)
Potassium: 5.3 mmol/L — ABNORMAL HIGH (ref 3.5–5.1)
Potassium: 4.4 mmol/L (ref 3.5–5.1)
Sodium: 135 mmol/L (ref 135–145)
Sodium: 136 mmol/L (ref 135–145)
Sodium: 134 mmol/L — ABNORMAL LOW (ref 135–145)

## 2015-11-12 LAB — URINALYSIS, ROUTINE W REFLEX MICROSCOPIC
Glucose, UA: NEGATIVE mg/dL
Hgb urine dipstick: NEGATIVE
Ketones, ur: >80 mg/dL — AB
Leukocytes, UA: NEGATIVE
Nitrite: NEGATIVE
Protein, ur: 30 mg/dL — AB
Specific Gravity, Urine: 1.028 (ref 1.005–1.030)
pH: 5.5 (ref 5.0–8.0)

## 2015-11-12 LAB — RAPID URINE DRUG SCREEN, HOSP PERFORMED
Amphetamines: NOT DETECTED
Barbiturates: NOT DETECTED
Benzodiazepines: NOT DETECTED
Cocaine: NOT DETECTED
Opiates: NOT DETECTED
Tetrahydrocannabinol: NOT DETECTED

## 2015-11-12 LAB — LIPASE, BLOOD: Lipase: 96 U/L — ABNORMAL HIGH (ref 11–51)

## 2015-11-12 LAB — CBC
HCT: 49.4 % (ref 39.0–52.0)
Hemoglobin: 16.3 g/dL (ref 13.0–17.0)
MCH: 28.6 pg (ref 26.0–34.0)
MCHC: 33 g/dL (ref 30.0–36.0)
MCV: 86.8 fL (ref 78.0–100.0)
Platelets: 268 10*3/uL (ref 150–400)
RBC: 5.69 MIL/uL (ref 4.22–5.81)
RDW: 14.7 % (ref 11.5–15.5)
WBC: 11.8 10*3/uL — ABNORMAL HIGH (ref 4.0–10.5)

## 2015-11-12 LAB — HEPATIC FUNCTION PANEL
ALT: 12 U/L — ABNORMAL LOW (ref 17–63)
AST: 30 U/L (ref 15–41)
Albumin: 4 g/dL (ref 3.5–5.0)
Alkaline Phosphatase: 163 U/L — ABNORMAL HIGH (ref 38–126)
Bilirubin, Direct: 0.3 mg/dL (ref 0.1–0.5)
Indirect Bilirubin: 1.8 mg/dL — ABNORMAL HIGH (ref 0.3–0.9)
Total Bilirubin: 2.1 mg/dL — ABNORMAL HIGH (ref 0.3–1.2)
Total Protein: 7.6 g/dL (ref 6.5–8.1)

## 2015-11-12 LAB — I-STAT CG4 LACTIC ACID, ED
Lactic Acid, Venous: 1.18 mmol/L (ref 0.5–2.0)
Lactic Acid, Venous: 0.56 mmol/L (ref 0.5–2.0)

## 2015-11-12 LAB — URINE MICROSCOPIC-ADD ON

## 2015-11-12 LAB — SALICYLATE LEVEL: Salicylate Lvl: <4 mg/dL (ref 2.8–30.0)

## 2015-11-12 LAB — ETHANOL: Alcohol, Ethyl (B): <5 mg/dL (ref <5)

## 2015-11-12 LAB — TROPONIN I: Troponin I: <0.03 ng/mL (ref <0.031)

## 2015-11-12 MED ORDER — PROCHLORPERAZINE EDISYLATE 5 MG/ML IJ SOLN
10.0000 mg | Freq: Four times a day (QID) | INTRAMUSCULAR | Status: DC | PRN
  Filled 2015-11-12: qty 2

## 2015-11-12 MED ORDER — PANTOPRAZOLE SODIUM 40 MG IV SOLR
40.0000 mg | Freq: Once | INTRAVENOUS | Status: AC
  Administered 2015-11-12: 40 mg via INTRAVENOUS
  Filled 2015-11-12: qty 40

## 2015-11-12 MED ORDER — IOHEXOL 300 MG/ML  SOLN
100.0000 mL | Freq: Once | INTRAMUSCULAR | Status: AC | PRN
  Administered 2015-11-12: 100 mL via INTRAVENOUS

## 2015-11-12 MED ORDER — AMLODIPINE BESYLATE 2.5 MG PO TABS
2.5000 mg | ORAL_TABLET | Freq: Every day | ORAL | Status: DC
  Administered 2015-11-12 – 2015-11-15 (×4): 2.5 mg via ORAL
  Filled 2015-11-12 (×5): qty 1

## 2015-11-12 MED ORDER — HYDROMORPHONE HCL 1 MG/ML IJ SOLN
1.0000 mg | Freq: Once | INTRAMUSCULAR | Status: AC
  Administered 2015-11-12: 1 mg via INTRAVENOUS
  Filled 2015-11-12: qty 1

## 2015-11-12 MED ORDER — BISACODYL 10 MG RE SUPP: 10.0000 mg | Freq: Every day | RECTAL | Status: DC | PRN

## 2015-11-12 MED ORDER — PROMETHAZINE HCL 25 MG/ML IJ SOLN: 6.2500 mg | Freq: Four times a day (QID) | INTRAMUSCULAR | Status: DC | PRN

## 2015-11-12 MED ORDER — HYDROMORPHONE HCL 1 MG/ML IJ SOLN
1.0000 mg | INTRAMUSCULAR | Status: AC | PRN
Start: 2015-11-12 — End: 2015-11-13
  Administered 2015-11-12: 1 mg via INTRAVENOUS
  Filled 2015-11-12: qty 1

## 2015-11-12 MED ORDER — ONDANSETRON HCL 4 MG/2ML IJ SOLN
4.0000 mg | INTRAMUSCULAR | Status: DC
  Administered 2015-11-12 – 2015-11-16 (×23): 4 mg via INTRAVENOUS
  Filled 2015-11-12 (×23): qty 2

## 2015-11-12 MED ORDER — GI COCKTAIL ~~LOC~~
30.0000 mL | Freq: Once | ORAL | Status: AC
  Administered 2015-11-12: 30 mL via ORAL
  Filled 2015-11-12: qty 30

## 2015-11-12 MED ORDER — ACETAMINOPHEN 325 MG PO TABS: 650.0000 mg | ORAL_TABLET | Freq: Four times a day (QID) | ORAL | Status: DC | PRN

## 2015-11-12 MED ORDER — TRAZODONE HCL 50 MG PO TABS
25.0000 mg | ORAL_TABLET | Freq: Every evening | ORAL | Status: DC | PRN
  Administered 2015-11-12 – 2015-11-15 (×2): 25 mg via ORAL
  Filled 2015-11-12 (×2): qty 1

## 2015-11-12 MED ORDER — GI COCKTAIL ~~LOC~~
30.0000 mL | Freq: Two times a day (BID) | ORAL | Status: DC | PRN
  Administered 2015-11-13: 30 mL via ORAL
  Filled 2015-11-12 (×2): qty 30

## 2015-11-12 MED ORDER — BOOST / RESOURCE BREEZE PO LIQD: 1.0000 | Freq: Three times a day (TID) | ORAL | Status: DC

## 2015-11-12 MED ORDER — PANTOPRAZOLE SODIUM 40 MG IV SOLR
40.0000 mg | INTRAVENOUS | Status: DC
  Administered 2015-11-13: 40 mg via INTRAVENOUS
  Filled 2015-11-12: qty 40

## 2015-11-12 MED ORDER — FAMOTIDINE 20 MG PO TABS
20.0000 mg | ORAL_TABLET | Freq: Once | ORAL | Status: AC
  Administered 2015-11-12: 20 mg via ORAL
  Filled 2015-11-12: qty 1

## 2015-11-12 MED ORDER — SODIUM CHLORIDE 0.9 % IV SOLN
INTRAVENOUS | Status: DC
  Administered 2015-11-12 – 2015-11-14 (×6): via INTRAVENOUS

## 2015-11-12 MED ORDER — ONDANSETRON HCL 4 MG/2ML IJ SOLN
4.0000 mg | Freq: Once | INTRAMUSCULAR | Status: AC
  Administered 2015-11-12: 4 mg via INTRAVENOUS
  Filled 2015-11-12: qty 2

## 2015-11-12 MED ORDER — ENOXAPARIN SODIUM 40 MG/0.4ML ~~LOC~~ SOLN
40.0000 mg | SUBCUTANEOUS | Status: DC
  Administered 2015-11-13 – 2015-11-15 (×3): 40 mg via SUBCUTANEOUS
  Filled 2015-11-12 (×3): qty 0.4

## 2015-11-12 MED ORDER — HYDROMORPHONE HCL 1 MG/ML IJ SOLN: 1.0000 mg | INTRAMUSCULAR | Status: DC | PRN

## 2015-11-12 MED ORDER — SODIUM CHLORIDE 0.9 % IV BOLUS (SEPSIS)
1000.0000 mL | Freq: Once | INTRAVENOUS | Status: AC
  Administered 2015-11-12: 1000 mL via INTRAVENOUS

## 2015-11-12 MED ORDER — PANCRELIPASE (LIP-PROT-AMYL) 12000-38000 UNITS PO CPEP
24000.0000 [IU] | ORAL_CAPSULE | Freq: Every day | ORAL | Status: DC
  Administered 2015-11-12 – 2015-11-15 (×4): 24000 [IU] via ORAL
  Filled 2015-11-12 (×4): qty 2

## 2015-11-12 MED ORDER — MORPHINE SULFATE (PF) 4 MG/ML IV SOLN
4.0000 mg | Freq: Once | INTRAVENOUS | Status: AC
  Administered 2015-11-12: 4 mg via INTRAVENOUS
  Filled 2015-11-12: qty 1

## 2015-11-12 MED ORDER — ACETAMINOPHEN 650 MG RE SUPP: 650.0000 mg | Freq: Four times a day (QID) | RECTAL | Status: DC | PRN

## 2015-11-12 NOTE — ED Notes (Signed)
The pt walked from the [facility] c./o abd and chest pain for 7 days  He was seen here in the past 2 days with the same

## 2015-11-12 NOTE — H&P (Signed)
[facility] History and Physical  YEHUDAH GROUNDS [id number] DOB: 28-Mar-1966 DOA: 11/12/2015  Referring physician: Loni Muse PCP: Minerva Ends, MD   Chief Complaint: intractable nausea and vomiting/dehydration/abdominal pain  HPI: Brent Rogers is a 50 y.o. male with a past medical history of chronic pancreatitis, status post Whipple procedure for pancreatic mass in 2015, hypertension, scoliosis since emergency department with the chief complaint of intractable abdominal pain, nausea with vomiting. Initial evaluation in the emergency department reveals metabolic acidosis likely secondary to dehydration related to intractable nausea and vomiting, hyperkalemia acute kidney injury.  Information is obtained from the patient. He reports 5 days ago developed abdominal pain. Pain is sharp and constant located in the lower left hand quadrant radiates to the left lumbar spine. 4 days ago he developed nausea with vomiting. Describes the emesis as clear to greenish. He denies coffee ground emesis. He also complains of not being able to keep any fluids down and a "burning" sensation when he swallows liquids. In addition he describes the sensation of "something being wrong" in his epigastric area. He denies distention diarrhea constipation. He denies fever chills headache dizziness syncope or near-syncope. He denies chest pain palpitations shortness of breath cough.  In the emergency department he is afebrile and hemodynamically stable with an elevated blood pressure tachycardic with a heart rate of 125, he is not hypoxic.  He is provided with 2 L of normal saline, GI cocktail Zofran and Dilaudid. He is unable to keep fluids down  Review of Systems:  10 point review of systems complete and all systems are negative except as indicated in the history of present illness  Past Medical History  Diagnosis Date  . Scoliosis   . Pancreatic abnormality     CT  shows mass  . Chronic  pancreatitis ([place])     S/P Whipple 05/2014/notes 11/20/2014  . Hypertension    Past Surgical History  Procedure Laterality Date  . Lumbar disc surgery  1990's  . Finger fracture surgery Left 1995    5th digit  . Eus N/A 09/21/2013    Procedure: ESOPHAGEAL ENDOSCOPIC ULTRASOUND (EUS) RADIAL;  Surgeon: Beryle Beams, MD;  Location: [facility];  Service: Endoscopy;  Laterality: N/A;  . Fine needle aspiration N/A 09/21/2013    Procedure: FINE NEEDLE ASPIRATION (FNA) LINEAR;  Surgeon: Beryle Beams, MD;  Location: [facility];  Service: Endoscopy;  Laterality: N/A;  . Eus N/A 09/30/2013    Procedure: UPPER ENDOSCOPIC ULTRASOUND (EUS) LINEAR;  Surgeon: Beryle Beams, MD;  Location: [facility];  Service: Endoscopy;  Laterality: N/A;  . Laparoscopy N/A 12/01/2013    Procedure: LAPAROSCOPY DIAGNOSTIC PANCREATICODUODENECTOMY WITH BILIARY AND PANCREATIC STENTS;  Surgeon: Stark Klein, MD;  Location: [facility];  Service: General;  Laterality: N/A;  . Whipple procedure    . Fracture surgery    . Back surgery     Social History:  reports that he has been smoking Cigarettes.  He has a 7.75 pack-year smoking history. He has quit using smokeless tobacco. His smokeless tobacco use included Snuff. He reports that he drinks about 1.2 oz of alcohol per week. He reports that he does not use illicit drugs. He lives at home with sibling he is independent with ADLs denies EtOH use No Known Allergies  Family History  Problem Relation Age of Onset  . Cancer Mother     unsure  . Hypertension Father    he has 5 siblings whose collective medical history is positive for CAD  and pancreatic cancer.  Prior to Admission medications   Medication Sig Start Date End Date Taking? Authorizing Provider  acetaminophen (TYLENOL) 500 MG tablet Take 500 mg by mouth every 6 (six) hours as needed for moderate pain (stomach pain).   Yes Historical Provider, MD  amLODipine (NORVASC) 2.5 MG tablet Take 1 tablet (2.5 mg total) by  mouth daily. 10/15/15  Yes Josalyn Funches, MD  bismuth subsalicylate (PEPTO BISMOL) 262 MG/15ML suspension Take 30 mLs by mouth every 6 (six) hours as needed (stomach ache).   Yes Historical Provider, MD  Pancrelipase, Lip-Prot-Amyl, 24000 units CPEP Take 1 capsule (24,000 Units total) by mouth at bedtime. 10/15/15  Yes Josalyn Funches, MD  promethazine (PHENERGAN) 25 MG tablet Take 1 tablet (25 mg total) by mouth every 8 (eight) hours as needed for nausea or vomiting. 10/15/15  Yes Boykin Nearing, MD   Physical Exam: Filed Vitals:   11/12/15 1100 11/12/15 1130 11/12/15 1200 11/12/15 1230  BP: 141/105 142/104 136/97 135/97  Pulse: 95 88 83 92  Temp:      Resp: 17 17 19 16   Weight:      SpO2: 100% 100% 100% 100%    Wt Readings from Last 3 Encounters:  11/12/15 66.764 kg (147 lb 3 oz)  11/10/15 72.576 kg (160 lb)  10/15/15 68.493 kg (151 lb)    General:  Appears calm and comfortable, very thin Eyes: PERRL, normal lids, irises & conjunctiva ENT: grossly normal hearing, mucous membranes of his mouth are pink but dry Neck: no LAD, masses or thyromegaly Cardiovascular: RRR, no m/r/g. No LE edema.  Respiratory:  Normal respiratory effort. Breath sounds somewhat coarse. Moist nonproductive cough during exam Abdomen: soft, flat positive bowel sounds but sluggish moderate tenderness left lower quadrant no flank tenderness Skin: no rash or induration seen on limited exam Musculoskeletal: grossly normal tone BUE/BLE Psychiatric: grossly normal mood and affect, speech fluent and appropriate Neurologic: grossly non-focal. speech clear facial symmetry           Labs on Admission:  Basic Metabolic Panel:  Recent Labs Lab 11/10/15 1535 11/12/15 0445 11/12/15 1144  NA 135 135 136  K 4.5 4.5 5.3*  CL 97* 92* 99*  CO2 21* 21* 21*  GLUCOSE 94 123* 106*  BUN <5* 11 10  CREATININE 0.90 1.25* 1.11  CALCIUM 9.4 9.9 8.8*   Liver Function Tests:  Recent Labs Lab 11/12/15 0430  AST 30    ALT 12*  ALKPHOS 163*  BILITOT 2.1*  PROT 7.6  ALBUMIN 4.0    Recent Labs Lab 11/12/15 0430  LIPASE 96*   No results for input(s): AMMONIA in the last 168 hours. CBC:  Recent Labs Lab 11/10/15 1535 11/12/15 0445  WBC 10.0 11.8*  HGB 16.4 16.3  HCT 48.7 49.4  MCV 85.9 86.8  PLT 294 268   Cardiac Enzymes:  Recent Labs Lab 11/12/15 0445  TROPONINI <0.03    BNP (last 3 results) No results for input(s): BNP in the last 8760 hours.  ProBNP (last 3 results) No results for input(s): PROBNP in the last 8760 hours.  CBG: No results for input(s): GLUCAP in the last 168 hours.  Radiological Exams on Admission: Dg Chest 2 View  11/12/2015  CLINICAL DATA:  Mid chest pain. Shortness of breath. Cough and vomiting after eating for 7 days. EXAM: CHEST  2 VIEW COMPARISON:  05/18/2015 FINDINGS: Hyperinflation suggesting emphysema. Central interstitial changes suggesting chronic bronchitis. Normal heart size and pulmonary vascularity. No focal airspace disease or  consolidation in the lungs. No blunting of costophrenic angles. No pneumothorax. Mediastinal contours appear intact. IMPRESSION: Mild emphysematous and chronic bronchitic changes in the lungs. No evidence of active pulmonary disease. Electronically Signed   By: Lucienne Capers M.D.   On: 11/12/2015 04:26   Ct Chest W Contrast  11/12/2015  CLINICAL DATA:  Chest and abdominal pain for 7 days. The patient feels as if they have a foreign body in the chest. EXAM: CT CHEST, ABDOMEN, AND PELVIS WITH CONTRAST TECHNIQUE: Multidetector CT imaging of the chest, abdomen and pelvis was performed following the standard protocol during bolus administration of intravenous contrast. CONTRAST:  100 mL OMNIPAQUE IOHEXOL 300 MG/ML  SOLN COMPARISON:  CT abdomen and pelvis 06/23/2015 and 03/23/2015. FINDINGS: CT CHEST There is no pleural or pericardial effusion. Heart size is normal. No axillary, hilar or mediastinal lymphadenopathy. The lungs are  clear. No foreign body is identified. No bony abnormality is seen. CT ABDOMEN AND PELVIS Again seen is postoperative change of Whipple procedure. The liver is low attenuating consistent with fatty infiltration but no focal liver lesion is seen. Pneumobilia is noted. The adrenal glands and spleen appear normal. Single, small bilateral renal cysts are unchanged. The kidneys are otherwise unremarkable. Scattered aortoiliac atherosclerosis without aneurysm is identified. The colon is normal in appearance. The appendix appears normal. The small bowel is normal appearance with postoperative change noted. No focal bony abnormality is identified. Vacuum disc phenomenon L5-S1 is seen. There is mild convex right scoliosis of the lumbar spine. IMPRESSION: No acute finding chest, abdomen or pelvis. No abnormality to explain the patient's symptoms. Fatty infiltration of the liver. Scattered aortic atherosclerosis. Status post Whipple. Electronically Signed   By: Inge Rise M.D.   On: 11/12/2015 10:58   Ct Abdomen Pelvis W Contrast  11/12/2015  CLINICAL DATA:  Chest and abdominal pain for 7 days. The patient feels as if they have a foreign body in the chest. EXAM: CT CHEST, ABDOMEN, AND PELVIS WITH CONTRAST TECHNIQUE: Multidetector CT imaging of the chest, abdomen and pelvis was performed following the standard protocol during bolus administration of intravenous contrast. CONTRAST:  100 mL OMNIPAQUE IOHEXOL 300 MG/ML  SOLN COMPARISON:  CT abdomen and pelvis 06/23/2015 and 03/23/2015. FINDINGS: CT CHEST There is no pleural or pericardial effusion. Heart size is normal. No axillary, hilar or mediastinal lymphadenopathy. The lungs are clear. No foreign body is identified. No bony abnormality is seen. CT ABDOMEN AND PELVIS Again seen is postoperative change of Whipple procedure. The liver is low attenuating consistent with fatty infiltration but no focal liver lesion is seen. Pneumobilia is noted. The adrenal glands and  spleen appear normal. Single, small bilateral renal cysts are unchanged. The kidneys are otherwise unremarkable. Scattered aortoiliac atherosclerosis without aneurysm is identified. The colon is normal in appearance. The appendix appears normal. The small bowel is normal appearance with postoperative change noted. No focal bony abnormality is identified. Vacuum disc phenomenon L5-S1 is seen. There is mild convex right scoliosis of the lumbar spine. IMPRESSION: No acute finding chest, abdomen or pelvis. No abnormality to explain the patient's symptoms. Fatty infiltration of the liver. Scattered aortic atherosclerosis. Status post Whipple. Electronically Signed   By: Inge Rise M.D.   On: 11/12/2015 10:58    EKG: Independently reviewed. Sinus tachycardia with right atrial enlargement  Assessment/Plan Principal Problem:   Intractable nausea and vomiting Active Problems:   Protein-calorie malnutrition, severe (HCC)   Smoking   HTN (hypertension)   S/P cholecystectomy   Dehydration  Metabolic acidosis   Acute kidney injury ([place])   Hyperkalemia   Fatty liver   Elevated alkaline phosphatase level   GERD (gastroesophageal reflux disease)  #1. Intractable nausea vomiting/abdominal pain. History of same. May be some acute on chronic pancreatitis however lipase is mildly elevated at 96. Lactic acid within the limits of normal. He is afebrile nontoxic appearing. CT of the abdomen pelvis reveals fatty liver otherwise unremarkable. Chart review indicates lipase 142 last admission. -Admit to medical bed -Supportive therapy in the form of clear liquids only, IV fluids, scheduled Zofran with Compazine as needed -Pain management -Advance diet as tolerated -GI cocktail  2. Acute kidney injury. Likely related to dehydration in the setting of #1. -Vigorous IV fluids -Hold nephrotoxins -Monitor urine output -Recheck in the morning -If no improvement consider renal ultrasound   3.  Dehydration/metabolic acidosis. Related to #1. On presentation anion gap 22. He was provided with 2 L normal saline. On admission anion gap 16 -Continue vigorous IV fluids -See #1 -Recheck this afternoon  #4. Hyperkalemia. Mild. Question hemolyzed. Potassium level on presentation 4.5. Repeat potassium after 2 L of normal saline 5.3 in spite of improved kidney function. -We'll continue IV fluids -Recheck bemet this afternoon  #5. Elevated alkaline phosphatase in setting of chronic pancreatitis. See #1. CT reveals fatty liver. Total bili 2.1 -History of same per chart review -IV fluids as noted above -Outpatient follow-up  #6. Hypertension. Blood pressure high end of normal. Likely related to pain home medications include Norvasc -Pain management -Resume home Norvasc -IV hydralazine when necessary  7. Tobacco use. Last cigarette was 2 days ago. -Cessation counseling offered  #8. Protein calorie malnutrition. Severe. History of same -Nutritional consult  #9. Query silent gerd. Reports burning with swallowing liquids and sensation of fullness and epigastric area. -Speech therapy evaluation -consider further testing for esophageal stricture -GI cocktail twice a day    Code Status: full DVT Prophylaxis: Family Communication: none present Disposition Plan: home when ready hopefully 24 hours  Time spent: 65 minutes  [facility]

## 2015-11-12 NOTE — ED Provider Notes (Signed)
CSN: [id number]     Arrival date & time 11/12/15  0347 History   First MD Initiated Contact with Patient 11/12/15 [phone]     Chief Complaint  Patient presents with  . Abdominal Pain     (Consider location/radiation/quality/duration/timing/severity/associated sxs/prior Treatment) The history is provided by the patient and medical records. No language interpreter was used.     Brent Rogers is a 50 y.o. male  with a hx of pancreatitis, HTN, pancreatitis s/p Whipple (march 2015) presents to the Emergency Department complaining of gradual, persistent, progressively worsening chest pain onset 5 days ago. Pt reports the chest pain is a burning in the chest and a feeling of food "getting stuck."  Pt reports that anytime he ate something or took a pill it felt as if it got hung and then he would vomit.  This has been going on since Sat (2 days ago).  Pt reports he continues to feel as if there is something stuck in his chest.  Associated symptoms include vomiting x2 today, but multiple times in the last few days.  Pt reports emesis has been yellow mucus without blood.  No melena or hematochezia.  Associated abd pain is on the left side.  Pt reports he has been having bad sweats for several days.  No measured fever at home. Nothing makes the symptoms better or worse.  Pt's last EtOH intake was Feb 3rd.     Past Medical History  Diagnosis Date  . Scoliosis   . Pancreatic abnormality     CT  shows mass  . Chronic pancreatitis ([place])     S/P Whipple 05/2014/notes 11/20/2014  . Hypertension    Past Surgical History  Procedure Laterality Date  . Lumbar disc surgery  1990's  . Finger fracture surgery Left 1995    5th digit  . Eus N/A 09/21/2013    Procedure: ESOPHAGEAL ENDOSCOPIC ULTRASOUND (EUS) RADIAL;  Surgeon: Beryle Beams, MD;  Location: [facility];  Service: Endoscopy;  Laterality: N/A;  . Fine needle aspiration N/A 09/21/2013    Procedure: FINE NEEDLE ASPIRATION (FNA) LINEAR;  Surgeon:  Beryle Beams, MD;  Location: [facility];  Service: Endoscopy;  Laterality: N/A;  . Eus N/A 09/30/2013    Procedure: UPPER ENDOSCOPIC ULTRASOUND (EUS) LINEAR;  Surgeon: Beryle Beams, MD;  Location: [facility];  Service: Endoscopy;  Laterality: N/A;  . Laparoscopy N/A 12/01/2013    Procedure: LAPAROSCOPY DIAGNOSTIC PANCREATICODUODENECTOMY WITH BILIARY AND PANCREATIC STENTS;  Surgeon: Stark Klein, MD;  Location: [facility];  Service: General;  Laterality: N/A;  . Whipple procedure    . Fracture surgery    . Back surgery     Family History  Problem Relation Age of Onset  . Cancer Mother     unsure  . Hypertension Father    Social History  Substance Use Topics  . Smoking status: Current Every Day Smoker -- 0.25 packs/day for 31 years    Types: Cigarettes  . Smokeless tobacco: Former Systems developer    Types: Snuff     Comment: Smoking 3 cigs/day  . Alcohol Use: 1.2 oz/week    2 Cans of beer per week    Review of Systems  Constitutional: Negative for fever, diaphoresis, appetite change, fatigue and unexpected weight change.  HENT: Negative for mouth sores.   Eyes: Negative for visual disturbance.  Respiratory: Negative for cough, chest tightness, shortness of breath and wheezing.   Cardiovascular: Positive for chest pain.  Gastrointestinal: Positive for nausea, vomiting and  abdominal pain. Negative for diarrhea and constipation.  Endocrine: Negative for polydipsia, polyphagia and polyuria.  Genitourinary: Negative for dysuria, urgency, frequency and hematuria.  Musculoskeletal: Negative for back pain and neck stiffness.  Skin: Negative for rash.  Allergic/Immunologic: Negative for immunocompromised state.  Neurological: Negative for syncope, light-headedness and headaches.  Hematological: Does not bruise/bleed easily.  Psychiatric/Behavioral: Negative for sleep disturbance. The patient is not nervous/anxious.       Allergies  Review of patient's allergies indicates no known  allergies.  Home Medications   Prior to Admission medications   Medication Sig Start Date End Date Taking? Authorizing Provider  acetaminophen (TYLENOL) 500 MG tablet Take 500 mg by mouth every 6 (six) hours as needed for moderate pain (stomach pain).   Yes Historical Provider, MD  amLODipine (NORVASC) 2.5 MG tablet Take 1 tablet (2.5 mg total) by mouth daily. 10/15/15  Yes Josalyn Funches, MD  bismuth subsalicylate (PEPTO BISMOL) 262 MG/15ML suspension Take 30 mLs by mouth every 6 (six) hours as needed (stomach ache).   Yes Historical Provider, MD  Pancrelipase, Lip-Prot-Amyl, 24000 units CPEP Take 1 capsule (24,000 Units total) by mouth at bedtime. 10/15/15  Yes Josalyn Funches, MD  promethazine (PHENERGAN) 25 MG tablet Take 1 tablet (25 mg total) by mouth every 8 (eight) hours as needed for nausea or vomiting. 10/15/15  Yes Josalyn Funches, MD   BP 135/97 mmHg  Pulse 92  Temp(Src) 98.4 F (36.9 C)  Resp 16  Wt 66.764 kg  SpO2 100% Physical Exam  Constitutional: He appears well-developed and well-nourished. No distress.  Awake, alert, nontoxic appearance  HENT:  Head: Normocephalic and atraumatic.  Mouth/Throat: Oropharynx is clear and moist. No oropharyngeal exudate.  Eyes: Conjunctivae are normal. No scleral icterus.  Neck: Normal range of motion. Neck supple.  Cardiovascular: Normal rate, regular rhythm, normal heart sounds and intact distal pulses.   No murmur heard. Pulmonary/Chest: Effort normal and breath sounds normal. No respiratory distress. He has no wheezes.  Equal chest expansion  Abdominal: Soft. Bowel sounds are normal. He exhibits no distension and no mass. There is tenderness in the periumbilical area, left upper quadrant and left lower quadrant. There is guarding. There is no rebound and no CVA tenderness.  Left-sided abdominal tenderness with guarding No CVA tenderness  Musculoskeletal: Normal range of motion. He exhibits no edema.  Neurological: He is alert.   Speech is clear and goal oriented Moves extremities without ataxia  Skin: Skin is warm and dry. He is not diaphoretic.  Psychiatric: He has a normal mood and affect.  Nursing note and vitals reviewed.   ED Course  Procedures (including critical care time) Labs Review Labs Reviewed  BASIC METABOLIC PANEL - Abnormal; Notable for the following:    Chloride 92 (*)    CO2 21 (*)    Glucose, Bld 123 (*)    Creatinine, Ser 1.25 (*)    Anion gap 22 (*)    All other components within normal limits  CBC - Abnormal; Notable for the following:    WBC 11.8 (*)    All other components within normal limits  HEPATIC FUNCTION PANEL - Abnormal; Notable for the following:    ALT 12 (*)    Alkaline Phosphatase 163 (*)    Total Bilirubin 2.1 (*)    Indirect Bilirubin 1.8 (*)    All other components within normal limits  LIPASE, BLOOD - Abnormal; Notable for the following:    Lipase 96 (*)    All other components  within normal limits  URINALYSIS, ROUTINE W REFLEX MICROSCOPIC (NOT AT [facility]) - Abnormal; Notable for the following:    Color, Urine AMBER (*)    Bilirubin Urine LARGE (*)    Ketones, ur >80 (*)    Protein, ur 30 (*)    All other components within normal limits  URINE MICROSCOPIC-ADD ON - Abnormal; Notable for the following:    Squamous Epithelial / LPF 0-5 (*)    Bacteria, UA FEW (*)    Casts HYALINE CASTS (*)    All other components within normal limits  BASIC METABOLIC PANEL - Abnormal; Notable for the following:    Potassium 5.3 (*)    Chloride 99 (*)    CO2 21 (*)    Glucose, Bld 106 (*)    Calcium 8.8 (*)    Anion gap 16 (*)    All other components within normal limits  TROPONIN I  SALICYLATE LEVEL  ETHANOL  URINE RAPID DRUG SCREEN, HOSP PERFORMED  BASIC METABOLIC PANEL  I-STAT CG4 LACTIC ACID, ED  I-STAT CG4 LACTIC ACID, ED    Imaging Review Dg Chest 2 View  11/12/2015  CLINICAL DATA:  Mid chest pain. Shortness of breath. Cough and vomiting after eating for 7  days. EXAM: CHEST  2 VIEW COMPARISON:  05/18/2015 FINDINGS: Hyperinflation suggesting emphysema. Central interstitial changes suggesting chronic bronchitis. Normal heart size and pulmonary vascularity. No focal airspace disease or consolidation in the lungs. No blunting of costophrenic angles. No pneumothorax. Mediastinal contours appear intact. IMPRESSION: Mild emphysematous and chronic bronchitic changes in the lungs. No evidence of active pulmonary disease. Electronically Signed   By: Lucienne Capers M.D.   On: 11/12/2015 04:26   Ct Chest W Contrast  11/12/2015  CLINICAL DATA:  Chest and abdominal pain for 7 days. The patient feels as if they have a foreign body in the chest. EXAM: CT CHEST, ABDOMEN, AND PELVIS WITH CONTRAST TECHNIQUE: Multidetector CT imaging of the chest, abdomen and pelvis was performed following the standard protocol during bolus administration of intravenous contrast. CONTRAST:  100 mL OMNIPAQUE IOHEXOL 300 MG/ML  SOLN COMPARISON:  CT abdomen and pelvis 06/23/2015 and 03/23/2015. FINDINGS: CT CHEST There is no pleural or pericardial effusion. Heart size is normal. No axillary, hilar or mediastinal lymphadenopathy. The lungs are clear. No foreign body is identified. No bony abnormality is seen. CT ABDOMEN AND PELVIS Again seen is postoperative change of Whipple procedure. The liver is low attenuating consistent with fatty infiltration but no focal liver lesion is seen. Pneumobilia is noted. The adrenal glands and spleen appear normal. Single, small bilateral renal cysts are unchanged. The kidneys are otherwise unremarkable. Scattered aortoiliac atherosclerosis without aneurysm is identified. The colon is normal in appearance. The appendix appears normal. The small bowel is normal appearance with postoperative change noted. No focal bony abnormality is identified. Vacuum disc phenomenon L5-S1 is seen. There is mild convex right scoliosis of the lumbar spine. IMPRESSION: No acute finding  chest, abdomen or pelvis. No abnormality to explain the patient's symptoms. Fatty infiltration of the liver. Scattered aortic atherosclerosis. Status post Whipple. Electronically Signed   By: Inge Rise M.D.   On: 11/12/2015 10:58   Ct Abdomen Pelvis W Contrast  11/12/2015  CLINICAL DATA:  Chest and abdominal pain for 7 days. The patient feels as if they have a foreign body in the chest. EXAM: CT CHEST, ABDOMEN, AND PELVIS WITH CONTRAST TECHNIQUE: Multidetector CT imaging of the chest, abdomen and pelvis was performed following the standard  protocol during bolus administration of intravenous contrast. CONTRAST:  100 mL OMNIPAQUE IOHEXOL 300 MG/ML  SOLN COMPARISON:  CT abdomen and pelvis 06/23/2015 and 03/23/2015. FINDINGS: CT CHEST There is no pleural or pericardial effusion. Heart size is normal. No axillary, hilar or mediastinal lymphadenopathy. The lungs are clear. No foreign body is identified. No bony abnormality is seen. CT ABDOMEN AND PELVIS Again seen is postoperative change of Whipple procedure. The liver is low attenuating consistent with fatty infiltration but no focal liver lesion is seen. Pneumobilia is noted. The adrenal glands and spleen appear normal. Single, small bilateral renal cysts are unchanged. The kidneys are otherwise unremarkable. Scattered aortoiliac atherosclerosis without aneurysm is identified. The colon is normal in appearance. The appendix appears normal. The small bowel is normal appearance with postoperative change noted. No focal bony abnormality is identified. Vacuum disc phenomenon L5-S1 is seen. There is mild convex right scoliosis of the lumbar spine. IMPRESSION: No acute finding chest, abdomen or pelvis. No abnormality to explain the patient's symptoms. Fatty infiltration of the liver. Scattered aortic atherosclerosis. Status post Whipple. Electronically Signed   By: Inge Rise M.D.   On: 11/12/2015 10:58   I have personally reviewed and evaluated these images  and lab results as part of my medical decision-making.   EKG Interpretation   Date/Time:  Monday November 12 2015 04:08:06 EST Ventricular Rate:  105 PR Interval:  136 QRS Duration: 76 QT Interval:  342 QTC Calculation: 452 R Axis:   72 Text Interpretation:  Sinus tachycardia Right atrial enlargement  Borderline ECG agree. no change  Confirmed by Johnney Killian, MD, Jeannie Done [phone])  on 11/12/2015 4:38:58 PM        MDM   Final diagnoses:  Dehydration  Intractable vomiting with nausea, vomiting of unspecified type  S/P cholecystectomy  Recurrent pancreatitis ([place])   [name] presents with anion gap of 22. States persistent nausea and vomiting 3 days. Reports this is similar to pancreatitis flare. He has associated chest pain.  Psychosis of 11.8 and total bili of 2.1 noted. Greater than 80 ketones on UA.  She denies ingestion of aspirin, methanol, ethylene glycol.  Drug screen is negative. Salicylate level is less than 4 and alcohol level is less than 5. Patient given 2 L of fluid and BMP rechecked. Concern for hemolysis as potassium now reads 5.3. Some improvement in creatinine down to 1.1 down from 1.25. In anion gap at 16. By mouth trial given and patient tolerates only 2 steps stating that it worsens his pain severely and then has dry heaves.  Patient will be admitted for dehydration and intractable vomiting.  BP 135/97 mmHg  Pulse 92  Temp(Src) 98.4 F (36.9 C)  Resp 16  Wt 66.764 kg  SpO2 100%   Abigail Butts, PA-C 11/12/15 1642  Charlesetta Shanks, MD 11/14/15 1130

## 2015-11-12 NOTE — ED Notes (Signed)
Pt tolerated PO food/fluids w/o difficulty.

## 2015-11-13 ENCOUNTER — Inpatient Hospital Stay (HOSPITAL_COMMUNITY): Payer: MEDICAID

## 2015-11-13 DIAGNOSIS — I10 Essential (primary) hypertension: Secondary | ICD-10-CM

## 2015-11-13 DIAGNOSIS — Z720 Tobacco use: Secondary | ICD-10-CM

## 2015-11-13 LAB — CBC
HCT: 40.1 % (ref 39.0–52.0)
Hemoglobin: 13.1 g/dL (ref 13.0–17.0)
MCH: 28.7 pg (ref 26.0–34.0)
MCHC: 32.7 g/dL (ref 30.0–36.0)
MCV: 87.7 fL (ref 78.0–100.0)
Platelets: 208 10*3/uL (ref 150–400)
RBC: 4.57 MIL/uL (ref 4.22–5.81)
RDW: 14.8 % (ref 11.5–15.5)
WBC: 9 10*3/uL (ref 4.0–10.5)

## 2015-11-13 LAB — COMPREHENSIVE METABOLIC PANEL
ALT: 11 U/L — ABNORMAL LOW (ref 17–63)
AST: 17 U/L (ref 15–41)
Albumin: 2.9 g/dL — ABNORMAL LOW (ref 3.5–5.0)
Alkaline Phosphatase: 111 U/L (ref 38–126)
Anion gap: 10 (ref 5–15)
BUN: <5 mg/dL — ABNORMAL LOW (ref 6–20)
CO2: 23 mmol/L (ref 22–32)
Calcium: 8.8 mg/dL — ABNORMAL LOW (ref 8.9–10.3)
Chloride: 101 mmol/L (ref 101–111)
Creatinine, Ser: 0.78 mg/dL (ref 0.61–1.24)
GFR calc Af Amer: >60 mL/min (ref >60)
GFR calc non Af Amer: >60 mL/min (ref >60)
Glucose, Bld: 131 mg/dL — ABNORMAL HIGH (ref 65–99)
Potassium: 3.5 mmol/L (ref 3.5–5.1)
Sodium: 134 mmol/L — ABNORMAL LOW (ref 135–145)
Total Bilirubin: 1.3 mg/dL — ABNORMAL HIGH (ref 0.3–1.2)
Total Protein: 5.7 g/dL — ABNORMAL LOW (ref 6.5–8.1)

## 2015-11-13 LAB — LIPASE, BLOOD: Lipase: 42 U/L (ref 11–51)

## 2015-11-13 MED ORDER — OXYCODONE-ACETAMINOPHEN 5-325 MG PO TABS
2.0000 | ORAL_TABLET | Freq: Once | ORAL | Status: AC
  Administered 2015-11-13: 2 via ORAL
  Filled 2015-11-13: qty 2

## 2015-11-13 MED ORDER — PANTOPRAZOLE SODIUM 40 MG PO TBEC
40.0000 mg | DELAYED_RELEASE_TABLET | Freq: Two times a day (BID) | ORAL | Status: DC
  Administered 2015-11-13 – 2015-11-16 (×6): 40 mg via ORAL
  Filled 2015-11-13 (×7): qty 1

## 2015-11-13 MED ORDER — MORPHINE SULFATE (PF) 2 MG/ML IV SOLN
2.0000 mg | INTRAVENOUS | Status: DC | PRN
  Administered 2015-11-13 – 2015-11-16 (×12): 2 mg via INTRAVENOUS
  Filled 2015-11-13 (×12): qty 1

## 2015-11-13 MED ORDER — SUCRALFATE 1 GM/10ML PO SUSP
1.0000 g | Freq: Three times a day (TID) | ORAL | Status: DC
  Administered 2015-11-13 – 2015-11-16 (×11): 1 g via ORAL
  Filled 2015-11-13 (×11): qty 10

## 2015-11-13 MED ORDER — PRO-STAT SUGAR FREE PO LIQD: 30.0000 mL | Freq: Two times a day (BID) | ORAL | Status: DC

## 2015-11-13 MED ORDER — PRO-STAT SUGAR FREE PO LIQD
30.0000 mL | Freq: Three times a day (TID) | ORAL | Status: DC
  Administered 2015-11-13 – 2015-11-16 (×8): 30 mL via ORAL
  Filled 2015-11-13 (×10): qty 30

## 2015-11-13 MED ORDER — GUAIFENESIN ER 600 MG PO TB12
1200.0000 mg | ORAL_TABLET | Freq: Two times a day (BID) | ORAL | Status: DC
  Administered 2015-11-13 – 2015-11-16 (×7): 1200 mg via ORAL
  Filled 2015-11-13 (×8): qty 2

## 2015-11-13 NOTE — Progress Notes (Signed)
Initial Nutrition Assessment  DOCUMENTATION CODES:   Severe malnutrition in context of chronic illness  INTERVENTION:   -D/c Boost Breeze po TID, each supplement provides 250 kcal and 9 grams of protein, due to poor acceptance -30 ml Prostat TID  NUTRITION DIAGNOSIS:   Malnutrition related to chronic illness as evidenced by severe depletion of body fat, severe depletion of muscle mass.  GOAL:   Patient will meet greater than or equal to 90% of their needs  MONITOR:   PO intake, Supplement acceptance, Diet advancement, Labs, Weight trends, Skin, I & O's  REASON FOR ASSESSMENT:   Consult Poor PO  ASSESSMENT:   DENY ANZUALDA is a 50 y.o. male with a Past Medical History of chrnoic pancreatitis s/p whipple in 2015, HTN who presents with in tractable n/v  Pt admitted with intractable nausea and vomiting.   Hx obtained from pt at bedside. He endorses poor appetite secondary to food getting stuck in his sternal area. He also complains that his throat feels like it's burning when consuming clear liquids. He shares that his appetite has been poor at baseline and he has experienced wt loss over the past 2 years since undergoing Whipple procedure. He shares that he has very poor acceptance of Ensure and Boost Breeze supplements.   Wt hx reviewed. Noted wt stability over the past year.   Pt reports he consumed very little of his clear liquid diet, but feels he would be able to tolerate more solid foods if his diet was advanced.   Nutrition-Focused physical exam completed. Findings are moderate to severe fat depletion, moderate to severe muscle depletion, and no edema.   Case discussed with RN. Plan is for pt to undergo swallowing study today.   Labs reviewed: Na: 134 (on IV supplementation).   Diet Order:  Diet clear liquid Room service appropriate?: Yes; Fluid consistency:: Thin  Skin:  Reviewed, no issues  Last BM:  11/12/15  Height:   Ht Readings from Last 1 Encounters:   11/12/15 6\' 3"  (1.905 m)    Weight:   Wt Readings from Last 1 Encounters:  11/12/15 150 lb (68.04 kg)    Ideal Body Weight:  89.1 kg  BMI:  Body mass index is 18.75 kg/(m^2).  Estimated Nutritional Needs:   Kcal:  2000-2200  Protein:  105-120 grams  Fluid:  2.0-2.2 L  EDUCATION NEEDS:   Education needs addressed  Pranay Hilbun A. Jimmye Norman, RD, LDN, CDE Pager: [phone] After hours Pager: [phone]

## 2015-11-13 NOTE — Evaluation (Signed)
Clinical/Bedside Swallow Evaluation Patient Details  Name: RIDDIK THIERRY MRN: [id number] Date of Birth: Aug 07, 1966  Today's Date: 11/13/2015 Time: SLP Start Time (ACUTE ONLY): 1342 SLP Stop Time (ACUTE ONLY): [id number] SLP Time Calculation (min) (ACUTE ONLY): 10 min  Past Medical History:  Past Medical History  Diagnosis Date  . Scoliosis   . Pancreatic abnormality     CT  shows mass  . Chronic pancreatitis ([place])     S/P Whipple  . Hypertension   . Pneumonia 11/12/2015  . Migraine     "@ least once/month" (11/12/2015)   Past Surgical History:  Past Surgical History  Procedure Laterality Date  . Lumbar disc surgery  1990's  . Finger fracture surgery Left 1995    5th digit  . Eus N/A 09/21/2013    Procedure: ESOPHAGEAL ENDOSCOPIC ULTRASOUND (EUS) RADIAL;  Surgeon: Beryle Beams, MD;  Location: [facility];  Service: Endoscopy;  Laterality: N/A;  . Fine needle aspiration N/A 09/21/2013    Procedure: FINE NEEDLE ASPIRATION (FNA) LINEAR;  Surgeon: Beryle Beams, MD;  Location: [facility];  Service: Endoscopy;  Laterality: N/A;  . Eus N/A 09/30/2013    Procedure: UPPER ENDOSCOPIC ULTRASOUND (EUS) LINEAR;  Surgeon: Beryle Beams, MD;  Location: [facility];  Service: Endoscopy;  Laterality: N/A;  . Laparoscopy N/A 12/01/2013    Procedure: LAPAROSCOPY DIAGNOSTIC PANCREATICODUODENECTOMY WITH BILIARY AND PANCREATIC STENTS;  Surgeon: Stark Klein, MD;  Location: [facility];  Service: General;  Laterality: N/A;  . Whipple procedure  12/01/2013  . Fracture surgery    . Back surgery    . Knee arthroscopy Bilateral 1987-1989    right-left   HPI:  50 yo male with PMH of pancreatitis who presents with inability to keep down POs.    Assessment / Plan / Recommendation Clinical Impression  Pt's oropharyngeal swallow occurs swiftly and without overt signs of aspiration. His intake is limited due to reports of POs getting "stuck" in his chest, causing discomfort and ultimately regurgitation, although  this was not observed today. He reports feeling like he is having a "bad case of reflux". CT chest shows no acute findings. Suspect a primary esophageal dysphagia. Upon completion of assessment, pt was going for barium swallow study. Will follow along for resutls of testing to determine if our services are indicated.    Aspiration Risk  Mild aspiration risk    Diet Recommendation Thin liquid (per MD)   Liquid Administration via: Cup;Straw Medication Administration: Crushed with puree Supervision: Patient able to self feed Compensations: Slow rate;Small sips/bites Postural Changes: Seated upright at 90 degrees;Remain upright for at least 30 minutes after po intake    Other  Recommendations Oral Care Recommendations: Oral care BID   Follow up Recommendations  None    Frequency and Duration min 1 x/week  1 week       Prognosis Prognosis for Safe Diet Advancement: Good Barriers to Reach Goals: Other (Comment) (suspect primary esophageal dysphagia)      Swallow Study   General HPI: 50 yo male with PMH of pancreatitis who presents with inability to keep down POs.  Type of Study: Bedside Swallow Evaluation Previous Swallow Assessment: none in chart Diet Prior to this Study: Thin liquids Temperature Spikes Noted: No Respiratory Status: Room air History of Recent Intubation: No Behavior/Cognition: Alert;Cooperative;Pleasant mood Oral Cavity Assessment: Within Functional Limits Oral Care Completed by SLP: No Vision: Functional for self-feeding Self-Feeding Abilities: Able to feed self Patient Positioning: Upright in bed Baseline Vocal Quality: Normal  Oral/Motor/Sensory Function Overall Oral Motor/Sensory Function: Within functional limits   Ice Chips Ice chips: Not tested   Thin Liquid Thin Liquid: Within functional limits Presentation: Cup;Self Fed    Nectar Thick Nectar Thick Liquid: Not tested   Honey Thick Honey Thick Liquid: Not tested   Puree Puree: Not tested    Solid   GO   Solid: Not tested       Germain Osgood, M.A. CCC-SLP [phone]  Germain Osgood 11/13/2015,2:12 PM

## 2015-11-13 NOTE — Progress Notes (Signed)
[facility] PROGRESS NOTE  Brent Rogers [id number] DOB: 09/04/1966 DOA: 11/12/2015 PCP: Minerva Ends, MD  Assessment/Plan: #1. Intractable nausea vomiting/abdominal pain. History of same. May be some acute on chronic pancreatitis as lipase is mildly elevated at 96. Lactic acid within the limits of normal. He is afebrile nontoxic appearing. CT of the abdomen pelvis reveals fatty liver otherwise unremarkable. -continue IVF's, PRN analgesics and antiemetics  -slowly advance diet -Lipase back to normal this morning -follow clinical response; hopefully in home in the next 24-48 hours.  2. Acute kidney injury. Likely related to dehydration in the setting of #1. -resolved with IVF's -will monitor trend  3. Dehydration/acidosis. Related to #1. On presentation anion gap 22.  -Continue vigorous IV fluids -will follow BMET -Lactic acid WNL  #4. Hyperkalemia. Mild. Questionable hemolyzed.  -will monitor trend -no need for kayexalate at this level -will monitor  #5. Elevated alkaline phosphatase in setting of chronic pancreatitis. See #1. CT reveals fatty liver. Total bili 2.1 -History of same per chart review -Outpatient follow-up with GI for fatty liver recommended   #6. Hypertension. Blood pressure high end of normal. Likely related to pain. -will continue norvasc -follow VS   #7. Tobacco use. Last cigarette was 2 days ago. -Cessation counseling provided -patient contemplating quitting  #8. Protein calorie malnutrition. Severe. History of same -Nutritional consult ordered -will follow rc's  #9. GERD: Reports burning with swallowing liquids and sensation of fullness in epigastric area. -Esophagogram with moderate spontaneous reflux; no strictures, hernia or masses  -continue PPI and PRN GI cocktail -will add carafate    Code Status: Full Family Communication: no family at bedside  Disposition Plan: home when able to tolerate diet and PO meds; continue  supportive care. Lipase down to normal now.   Consultants:  None   Procedures:  See below for x-ray reports   Antibiotics:  None   HPI/Subjective: Afebrile; still with abd pain and nausea; reports no further vomiting.  Objective: Filed Vitals:   11/13/15 1000 11/13/15 1500  BP: 125/91 112/77  Pulse:  73  Temp:  98 F (36.7 C)  Resp:  18    Intake/Output Summary (Last 24 hours) at 11/13/15 2148 Last data filed at 11/13/15 1801  Gross per 24 hour  Intake 3091.67 ml  Output   1925 ml  Net 1166.67 ml   Filed Weights   11/12/15 0358 11/12/15 1536  Weight: 66.764 kg (147 lb 3 oz) 68.04 kg (150 lb)    Exam:   General:  Afebrile, still with abd pain and nausea; reports no vomiting currently. Trying to tolerate CLD  Cardiovascular: S1 and S2, no rubs or gallops  Respiratory: CTA bilaterally  Abdomen: mid abd tenderness with palpation, positive BS; mild guarding   Musculoskeletal: no edema or cyanosis    Data Reviewed: Basic Metabolic Panel:  Recent Labs Lab 11/10/15 1535 11/12/15 0445 11/12/15 1144 11/12/15 1608 11/13/15 0816  NA 135 135 136 134* 134*  K 4.5 4.5 5.3* 4.4 3.5  CL 97* 92* 99* 100* 101  CO2 21* 21* 21* 21* 23  GLUCOSE 94 123* 106* 119* 131*  BUN <5* 11 10 7  <5*  CREATININE 0.90 1.25* 1.11 0.78 0.78  CALCIUM 9.4 9.9 8.8* 8.8* 8.8*   Liver Function Tests:  Recent Labs Lab 11/12/15 0430 11/13/15 0816  AST 30 17  ALT 12* 11*  ALKPHOS 163* 111  BILITOT 2.1* 1.3*  PROT 7.6 5.7*  ALBUMIN 4.0 2.9*    Recent Labs Lab 11/12/15  0430 11/13/15 0816  LIPASE 96* 42   CBC:  Recent Labs Lab 11/10/15 1535 11/12/15 0445 11/13/15 0816  WBC 10.0 11.8* 9.0  HGB 16.4 16.3 13.1  HCT 48.7 49.4 40.1  MCV 85.9 86.8 87.7  PLT 294 268 208   Cardiac Enzymes:  Recent Labs Lab 11/12/15 0445  TROPONINI <0.03   CBG: No results for input(s): GLUCAP in the last 168 hours.  No results found for this or any previous visit (from the  past 240 hour(s)).   Studies: Dg Chest 2 View  11/12/2015  CLINICAL DATA:  Mid chest pain. Shortness of breath. Cough and vomiting after eating for 7 days. EXAM: CHEST  2 VIEW COMPARISON:  05/18/2015 FINDINGS: Hyperinflation suggesting emphysema. Central interstitial changes suggesting chronic bronchitis. Normal heart size and pulmonary vascularity. No focal airspace disease or consolidation in the lungs. No blunting of costophrenic angles. No pneumothorax. Mediastinal contours appear intact. IMPRESSION: Mild emphysematous and chronic bronchitic changes in the lungs. No evidence of active pulmonary disease. Electronically Signed   By: Lucienne Capers M.D.   On: 11/12/2015 04:26   Ct Chest W Contrast  11/12/2015  CLINICAL DATA:  Chest and abdominal pain for 7 days. The patient feels as if they have a foreign body in the chest. EXAM: CT CHEST, ABDOMEN, AND PELVIS WITH CONTRAST TECHNIQUE: Multidetector CT imaging of the chest, abdomen and pelvis was performed following the standard protocol during bolus administration of intravenous contrast. CONTRAST:  100 mL OMNIPAQUE IOHEXOL 300 MG/ML  SOLN COMPARISON:  CT abdomen and pelvis 06/23/2015 and 03/23/2015. FINDINGS: CT CHEST There is no pleural or pericardial effusion. Heart size is normal. No axillary, hilar or mediastinal lymphadenopathy. The lungs are clear. No foreign body is identified. No bony abnormality is seen. CT ABDOMEN AND PELVIS Again seen is postoperative change of Whipple procedure. The liver is low attenuating consistent with fatty infiltration but no focal liver lesion is seen. Pneumobilia is noted. The adrenal glands and spleen appear normal. Single, small bilateral renal cysts are unchanged. The kidneys are otherwise unremarkable. Scattered aortoiliac atherosclerosis without aneurysm is identified. The colon is normal in appearance. The appendix appears normal. The small bowel is normal appearance with postoperative change noted. No focal bony  abnormality is identified. Vacuum disc phenomenon L5-S1 is seen. There is mild convex right scoliosis of the lumbar spine. IMPRESSION: No acute finding chest, abdomen or pelvis. No abnormality to explain the patient's symptoms. Fatty infiltration of the liver. Scattered aortic atherosclerosis. Status post Whipple. Electronically Signed   By: Inge Rise M.D.   On: 11/12/2015 10:58   Ct Abdomen Pelvis W Contrast  11/12/2015  CLINICAL DATA:  Chest and abdominal pain for 7 days. The patient feels as if they have a foreign body in the chest. EXAM: CT CHEST, ABDOMEN, AND PELVIS WITH CONTRAST TECHNIQUE: Multidetector CT imaging of the chest, abdomen and pelvis was performed following the standard protocol during bolus administration of intravenous contrast. CONTRAST:  100 mL OMNIPAQUE IOHEXOL 300 MG/ML  SOLN COMPARISON:  CT abdomen and pelvis 06/23/2015 and 03/23/2015. FINDINGS: CT CHEST There is no pleural or pericardial effusion. Heart size is normal. No axillary, hilar or mediastinal lymphadenopathy. The lungs are clear. No foreign body is identified. No bony abnormality is seen. CT ABDOMEN AND PELVIS Again seen is postoperative change of Whipple procedure. The liver is low attenuating consistent with fatty infiltration but no focal liver lesion is seen. Pneumobilia is noted. The adrenal glands and spleen appear normal. Single, small bilateral  renal cysts are unchanged. The kidneys are otherwise unremarkable. Scattered aortoiliac atherosclerosis without aneurysm is identified. The colon is normal in appearance. The appendix appears normal. The small bowel is normal appearance with postoperative change noted. No focal bony abnormality is identified. Vacuum disc phenomenon L5-S1 is seen. There is mild convex right scoliosis of the lumbar spine. IMPRESSION: No acute finding chest, abdomen or pelvis. No abnormality to explain the patient's symptoms. Fatty infiltration of the liver. Scattered aortic atherosclerosis.  Status post Whipple. Electronically Signed   By: Inge Rise M.D.   On: 11/12/2015 10:58   Dg Esophagus  11/13/2015  CLINICAL DATA:  Burning mid esophagus with epigastric fullness. Vomiting. EXAM: ESOPHOGRAM / BARIUM SWALLOW / BARIUM TABLET STUDY TECHNIQUE: Combined double contrast and single contrast examination performed using thick liquid barium and thin barium liquid. The patient was observed with fluoroscopy swallowing a 13 mm barium sulphate tablet. FLUOROSCOPY TIME:  Fluoroscopy Time:  1 minutes 36 seconds Number of Acquired Images:  20 COMPARISON:  None. FINDINGS: Swallowing and peristalsis are normal. There is no appreciable hiatal hernia. There is reflux of a full column barium to mid esophagus which empties fairly slowly. There is no esophageal stricture, mass, or ulceration. The pharynx appears normal. 13 mm barium tablet passed through the esophagus into the stomach without significant hesitation. IMPRESSION: Moderate spontaneous reflux without hiatal hernia or stricture. Study otherwise unremarkable. Electronically Signed   By: Lowella Grip III M.D.   On: 11/13/2015 14:29    Scheduled Meds: . amLODipine  2.5 mg Oral Daily  . enoxaparin (LOVENOX) injection  40 mg Subcutaneous Q24H  . feeding supplement (PRO-STAT SUGAR FREE 64)  30 mL Oral TID BM  . guaiFENesin  1,200 mg Oral BID  . lipase/protease/amylase  24,000 Units Oral QHS  . ondansetron (ZOFRAN) IV  4 mg Intravenous Q4H  . pantoprazole  40 mg Oral BID   Continuous Infusions: . sodium chloride 100 mL/hr at 11/13/15 1719    Principal Problem:   Intractable nausea and vomiting Active Problems:   Protein-calorie malnutrition, severe (HCC)   Smoking   HTN (hypertension)   S/P cholecystectomy   Dehydration   Metabolic acidosis   Acute kidney injury ([place])   Hyperkalemia   Fatty liver   Intractable abdominal pain   Elevated alkaline phosphatase level   GERD (gastroesophageal reflux disease)    Time spent: 35  minutes    Barton Dubois  [facility] Pager [phone]. If 7PM-7AM, please contact night-coverage at [email or web address], password [facility] 11/13/2015, 9:48 PM  LOS: 1 day

## 2015-11-14 DIAGNOSIS — N179 Acute kidney failure, unspecified: Secondary | ICD-10-CM

## 2015-11-14 DIAGNOSIS — E860 Dehydration: Secondary | ICD-10-CM

## 2015-11-14 DIAGNOSIS — E43 Unspecified severe protein-calorie malnutrition: Secondary | ICD-10-CM

## 2015-11-14 DIAGNOSIS — R1110 Vomiting, unspecified: Secondary | ICD-10-CM

## 2015-11-14 DIAGNOSIS — K219 Gastro-esophageal reflux disease without esophagitis: Secondary | ICD-10-CM

## 2015-11-14 LAB — GLUCOSE, CAPILLARY: Glucose-Capillary: 99 mg/dL (ref 65–99)

## 2015-11-14 NOTE — Progress Notes (Signed)
[facility] PROGRESS NOTE  Brent Rogers [id number] DOB: [birth year] DOA: 11/12/2015 PCP: Minerva Ends, MD  Assessment/Plan: #1. Acute on chronic pancreatitis  -Patient presenting with intractable nausea vomiting/abdominal pain. -Suspect symptoms related to acute on chronic pancreatitis as lipase is mildly elevated at 96. Lactic acid within the limits of normal. He is afebrile nontoxic appearing. CT of the abdomen pelvis reveals fatty liver otherwise unremarkable. -continue IVF's, PRN analgesics and antiemetics  -Lipase level normalizing -Advancing his diet to full liquid diet today -Anticipate discharge in the next 24 hours if he continues to show clinical improvement  2. Acute kidney injury.  -Likely related to dehydration in the setting of #1. -resolved with IVF's -Encourage oral intake  3. Dehydration/acidosis.  -Improved with IV fluid hydration, IV secondary to GI losses from multiple episodes of nausea vomiting  #4. Hyperkalemia.  -Mild as labs showed potassium 5.3. Questionable hemolyzed.  -A.m. labs showing a potassium of 3.5  #5. Elevated alkaline phosphatase in setting of chronic pancreatitis.  - CT reveals fatty liver. Total bili 2.1 -History of same per chart review -Outpatient follow-up with GI for fatty liver recommended   #6. Hypertension.  -Blood pressures controlled -will continue norvasc 2.5 mg by mouth daily  #7. Tobacco use.  -Last cigarette was 2 days ago. -Cessation counseling provided -patient contemplating quitting  #8. Protein calorie malnutrition.  Severe. History of same -Nutritional consult ordered -will follow rc's  #9. GERD:  -Reports burning with swallowing liquids and sensation of fullness in epigastric area. -Esophagogram with moderate spontaneous reflux; no strictures, hernia or masses  -continue PPI and PRN GI cocktail -will add carafate   Code Status: Full Family Communication: no family at bedside  Disposition  Plan: Anticipate discharge in the next 24 hours   Consultants:  None   Procedures:  See below for x-ray reports   Antibiotics:  None   HPI/Subjective: Patient reporting feeling little better today, remains on clears, would like to try advancement of his diet  Objective: Filed Vitals:   11/14/15 0610 11/14/15 1500  BP: 121/93 135/83  Pulse: 60 76  Temp: 97.5 F (36.4 C) 98.3 F (36.8 C)  Resp: 17 18    Intake/Output Summary (Last 24 hours) at 11/14/15 1609 Last data filed at 11/14/15 1600  Gross per 24 hour  Intake   1880 ml  Output   2300 ml  Net   -420 ml   Filed Weights   11/12/15 0358 11/12/15 1536  Weight: 66.764 kg (147 lb 3 oz) 68.04 kg (150 lb)    Exam:   General:  He is awake and alert, thinks he is feeling a little better today. Want to try a full liquid diet today  Cardiovascular: S1 and S2, no rubs or gallops  Respiratory: CTA bilaterally  Abdomen: mid abd tenderness with palpation, positive BS; mild guarding   Musculoskeletal: no edema or cyanosis    Data Reviewed: Basic Metabolic Panel:  Recent Labs Lab 11/10/15 1535 11/12/15 0445 11/12/15 1144 11/12/15 1608 11/13/15 0816  NA 135 135 136 134* 134*  K 4.5 4.5 5.3* 4.4 3.5  CL 97* 92* 99* 100* 101  CO2 21* 21* 21* 21* 23  GLUCOSE 94 123* 106* 119* 131*  BUN <5* 11 10 7  <5*  CREATININE 0.90 1.25* 1.11 0.78 0.78  CALCIUM 9.4 9.9 8.8* 8.8* 8.8*   Liver Function Tests:  Recent Labs Lab 11/12/15 0430 11/13/15 0816  AST 30 17  ALT 12* 11*  ALKPHOS 163* 111  BILITOT 2.1* 1.3*  PROT 7.6 5.7*  ALBUMIN 4.0 2.9*    Recent Labs Lab 11/12/15 0430 11/13/15 0816  LIPASE 96* 42   CBC:  Recent Labs Lab 11/10/15 1535 11/12/15 0445 11/13/15 0816  WBC 10.0 11.8* 9.0  HGB 16.4 16.3 13.1  HCT 48.7 49.4 40.1  MCV 85.9 86.8 87.7  PLT 294 268 208   Cardiac Enzymes:  Recent Labs Lab 11/12/15 0445  TROPONINI <0.03   CBG:  Recent Labs Lab 11/14/15 0801  GLUCAP 99     No results found for this or any previous visit (from the past 240 hour(s)).   Studies: Dg Esophagus  11/13/2015  CLINICAL DATA:  Burning mid esophagus with epigastric fullness. Vomiting. EXAM: ESOPHOGRAM / BARIUM SWALLOW / BARIUM TABLET STUDY TECHNIQUE: Combined double contrast and single contrast examination performed using thick liquid barium and thin barium liquid. The patient was observed with fluoroscopy swallowing a 13 mm barium sulphate tablet. FLUOROSCOPY TIME:  Fluoroscopy Time:  1 minutes 36 seconds Number of Acquired Images:  20 COMPARISON:  None. FINDINGS: Swallowing and peristalsis are normal. There is no appreciable hiatal hernia. There is reflux of a full column barium to mid esophagus which empties fairly slowly. There is no esophageal stricture, mass, or ulceration. The pharynx appears normal. 13 mm barium tablet passed through the esophagus into the stomach without significant hesitation. IMPRESSION: Moderate spontaneous reflux without hiatal hernia or stricture. Study otherwise unremarkable. Electronically Signed   By: Lowella Grip III M.D.   On: 11/13/2015 14:29    Scheduled Meds: . amLODipine  2.5 mg Oral Daily  . enoxaparin (LOVENOX) injection  40 mg Subcutaneous Q24H  . feeding supplement (PRO-STAT SUGAR FREE 64)  30 mL Oral TID BM  . guaiFENesin  1,200 mg Oral BID  . lipase/protease/amylase  24,000 Units Oral QHS  . ondansetron (ZOFRAN) IV  4 mg Intravenous Q4H  . pantoprazole  40 mg Oral BID  . sucralfate  1 g Oral TID WC & HS   Continuous Infusions: . sodium chloride 100 mL/hr at 11/14/15 1329    Principal Problem:   Intractable nausea and vomiting Active Problems:   Protein-calorie malnutrition, severe (HCC)   Smoking   HTN (hypertension)   S/P cholecystectomy   Dehydration   Metabolic acidosis   Acute kidney injury ([place])   Hyperkalemia   Fatty liver   Intractable abdominal pain   Elevated alkaline phosphatase level   GERD (gastroesophageal  reflux disease)    Time spent: 30 minutes    Kelvin Cellar  [facility] Pager [phone]. If 7PM-7AM, please contact night-coverage at [email or web address], password [facility] 11/14/2015, 4:09 PM  LOS: 2 days

## 2015-11-14 NOTE — Progress Notes (Signed)
Speech Language Pathology Treatment: Dysphagia  Patient Details Name: Brent Rogers MRN: [id number] DOB: 07-Jan-1966 Today's Date: 11/14/2015 Time: 5573-2202 SLP Time Calculation (min) (ACUTE ONLY): 10 min  Assessment / Plan / Recommendation Clinical Impression  Results of barium swallow on previous date show normal swallow with moderate esophageal reflux. Today he consumed water via straw and spoonfuls of jello with normal appearing oropharyngeal function. When he is able to be advanced from a medical standpoint, he would be appropriate for regular textures and thin liquids with use of reflux precautions. No further SLP f/u indicated.   HPI HPI: 50 yo male with PMH of pancreatitis who presents with inability to keep down POs.       SLP Plan  All goals met     Recommendations  Diet recommendations: Regular;Thin liquid (as can be advanced ) Liquids provided via: Cup;Straw Medication Administration: Whole meds with liquid Supervision: Patient able to self feed Compensations: Slow rate;Small sips/bites;Follow solids with liquid Postural Changes and/or Swallow Maneuvers: Seated upright 90 degrees;Upright 30-60 min after meal             Oral Care Recommendations: Oral care BID Follow up Recommendations: None Plan: All goals met     GO               Germain Osgood, M.A. CCC-SLP [phone]   Germain Osgood 11/14/2015, 2:54 PM

## 2015-11-15 DIAGNOSIS — K859 Acute pancreatitis without necrosis or infection, unspecified: Principal | ICD-10-CM

## 2015-11-15 DIAGNOSIS — K861 Other chronic pancreatitis: Secondary | ICD-10-CM

## 2015-11-15 DIAGNOSIS — G43A1 Cyclical vomiting, intractable: Secondary | ICD-10-CM

## 2015-11-15 MED ORDER — AMLODIPINE BESYLATE 5 MG PO TABS
5.0000 mg | ORAL_TABLET | Freq: Every day | ORAL | Status: DC
  Administered 2015-11-16: 5 mg via ORAL
  Filled 2015-11-15: qty 1

## 2015-11-15 NOTE — Progress Notes (Signed)
[facility] PROGRESS NOTE  Brent Rogers [id number] DOB: 12/30/1965 DOA: 11/12/2015 PCP: Minerva Ends, MD  Assessment/Plan: #1. Acute on chronic pancreatitis  -Patient presenting with intractable nausea vomiting/abdominal pain. -Suspect symptoms related to acute on chronic pancreatitis as lipase is mildly elevated at 96. Lactic acid within the limits of normal. He is afebrile nontoxic appearing. CT of the abdomen pelvis reveals fatty liver otherwise unremarkable. -continue IVF's, PRN analgesics and antiemetics  -Lipase level normalizing -On 11/15/2015 reporting intermittent episodes of abdominal pain in the last 24 hours, however, denies N/V. Plan to advance to a regular diet and monitor. Anticipate discharge in the next 24 hours if he tolerates diet  2. Acute kidney injury.  -Likely related to dehydration in the setting of #1. -resolved with IVF's -Encourage oral intake  3. Dehydration/acidosis.  -Improved with IV fluid hydration, IV secondary to GI losses from multiple episodes of nausea vomiting  #4. Hyperkalemia.  -Mild as labs showed potassium 5.3. Questionable hemolyzed.  -A.m. labs showing a potassium of 3.5  #5. Elevated alkaline phosphatase in setting of chronic pancreatitis.  - CT reveals fatty liver. Total bili 2.1 -History of same per chart review -Outpatient follow-up with GI for fatty liver recommended   #6. Hypertension.  -Blood pressures elevated, will increase Norvasc to 5 mg PO q daily.   #7. Tobacco use.  -Last cigarette was 2 days ago. -Cessation counseling provided -patient contemplating quitting  #8. Protein calorie malnutrition.  Severe. History of same -Nutritional consult ordered -will follow rc's  #9. GERD:  -Reports burning with swallowing liquids and sensation of fullness in epigastric area. -Esophagogram with moderate spontaneous reflux; no strictures, hernia or masses  -continue PPI and PRN GI cocktail -will add carafate    Code Status: Full Family Communication: no family at bedside  Disposition Plan: Anticipate discharge in the next 24 hours   Consultants:  None   Procedures:  See below for x-ray reports   Antibiotics:  None   HPI/Subjective: Patient reporting having several episodes of abdominal pain since advancing to full liquids. He denies N/V/diarrhea.  Objective: Filed Vitals:   11/15/15 0952 11/15/15 1357  BP: 140/101 137/101  Pulse:  65  Temp:  98.1 F (36.7 C)  Resp:  14    Intake/Output Summary (Last 24 hours) at 11/15/15 1536 Last data filed at 11/15/15 1315  Gross per 24 hour  Intake    622 ml  Output   2275 ml  Net  -1653 ml   Filed Weights   11/12/15 0358 11/12/15 1536  Weight: 66.764 kg (147 lb 3 oz) 68.04 kg (150 lb)    Exam:   General:  He is awake and alert, nontoxic appearing.   Cardiovascular: S1 and S2, no rubs or gallops  Respiratory: CTA bilaterally  Abdomen: mid abd tenderness with palpation, positive BS; mild guarding   Musculoskeletal: no edema or cyanosis    Data Reviewed: Basic Metabolic Panel:  Recent Labs Lab 11/10/15 1535 11/12/15 0445 11/12/15 1144 11/12/15 1608 11/13/15 0816  NA 135 135 136 134* 134*  K 4.5 4.5 5.3* 4.4 3.5  CL 97* 92* 99* 100* 101  CO2 21* 21* 21* 21* 23  GLUCOSE 94 123* 106* 119* 131*  BUN <5* 11 10 7  <5*  CREATININE 0.90 1.25* 1.11 0.78 0.78  CALCIUM 9.4 9.9 8.8* 8.8* 8.8*   Liver Function Tests:  Recent Labs Lab 11/12/15 0430 11/13/15 0816  AST 30 17  ALT 12* 11*  ALKPHOS 163* 111  BILITOT  2.1* 1.3*  PROT 7.6 5.7*  ALBUMIN 4.0 2.9*    Recent Labs Lab 11/12/15 0430 11/13/15 0816  LIPASE 96* 42   CBC:  Recent Labs Lab 11/10/15 1535 11/12/15 0445 11/13/15 0816  WBC 10.0 11.8* 9.0  HGB 16.4 16.3 13.1  HCT 48.7 49.4 40.1  MCV 85.9 86.8 87.7  PLT 294 268 208   Cardiac Enzymes:  Recent Labs Lab 11/12/15 0445  TROPONINI <0.03   CBG:  Recent Labs Lab 11/14/15 0801   GLUCAP 99    No results found for this or any previous visit (from the past 240 hour(s)).   Studies: No results found.  Scheduled Meds: . [START ON 11/16/2015] amLODipine  5 mg Oral Daily  . enoxaparin (LOVENOX) injection  40 mg Subcutaneous Q24H  . feeding supplement (PRO-STAT SUGAR FREE 64)  30 mL Oral TID BM  . guaiFENesin  1,200 mg Oral BID  . lipase/protease/amylase  24,000 Units Oral QHS  . ondansetron (ZOFRAN) IV  4 mg Intravenous Q4H  . pantoprazole  40 mg Oral BID  . sucralfate  1 g Oral TID WC & HS   Continuous Infusions:    Principal Problem:   Intractable nausea and vomiting Active Problems:   Protein-calorie malnutrition, severe (HCC)   Smoking   HTN (hypertension)   S/P cholecystectomy   Dehydration   Metabolic acidosis   Acute kidney injury ([place])   Hyperkalemia   Fatty liver   Intractable abdominal pain   Elevated alkaline phosphatase level   GERD (gastroesophageal reflux disease)    Time spent: 15 minutes    Kelvin Cellar  [facility] Pager [phone]. If 7PM-7AM, please contact night-coverage at [email or web address], password [facility] 11/15/2015, 3:36 PM  LOS: 3 days

## 2015-11-15 NOTE — Progress Notes (Signed)
Nutrition Follow-up  DOCUMENTATION CODES:   Severe malnutrition in context of chronic illness  INTERVENTION:   -Continue 30 ml Prostat TID, each supplement provides 100 kcals and 15 grams protein  NUTRITION DIAGNOSIS:   Malnutrition related to chronic illness as evidenced by severe depletion of body fat, severe depletion of muscle mass.  Ongoing  GOAL:   Patient will meet greater than or equal to 90% of their needs  Progressing  MONITOR:   PO intake, Supplement acceptance, Diet advancement, Labs, Weight trends, Skin, I & O's  REASON FOR ASSESSMENT:   Consult Poor PO  ASSESSMENT:   Brent Rogers is a 50 y.o. male with a Past Medical History of chrnoic pancreatitis s/p whipple in 2015, HTN who presents with in tractable n/v  Pt underwent SLP evaluations on 11/13/15 and 11/14/15; pt has met all goals and educated on reflux precautions.   Spoke with RN, who reveals pt is tolerating full liquid diet well and is eager to advance to solid foods. Meal completion 50-100%. Pt is taking Prostat supplements. Per RN, he just received one prior to conversing with RD.   Labs reviewed: Na: 134, Glucose: 131.   Diet Order:  Diet regular Room service appropriate?: Yes; Fluid consistency:: Thin  Skin:  Reviewed, no issues  Last BM:  11/14/15  Height:   Ht Readings from Last 1 Encounters:  11/12/15 '6\' 3"'$  (1.905 m)    Weight:   Wt Readings from Last 1 Encounters:  11/12/15 150 lb (68.04 kg)    Ideal Body Weight:  89.1 kg  BMI:  Body mass index is 18.75 kg/(m^2).  Estimated Nutritional Needs:   Kcal:  2000-2200  Protein:  105-120 grams  Fluid:  2.0-2.2 L  EDUCATION NEEDS:   Education needs addressed  Cedra Villalon A. Jimmye Norman, RD, LDN, CDE Pager: [phone] After hours Pager: [phone]

## 2015-11-16 MED ORDER — PANTOPRAZOLE SODIUM 40 MG PO TBEC: 40.0000 mg | DELAYED_RELEASE_TABLET | Freq: Every day | ORAL | Status: DC

## 2015-11-16 MED ORDER — OXYCODONE HCL 5 MG PO TABS: 5.0000 mg | ORAL_TABLET | Freq: Four times a day (QID) | ORAL | Status: DC | PRN

## 2015-11-16 MED ORDER — ONDANSETRON HCL 4 MG PO TABS: 4.0000 mg | ORAL_TABLET | Freq: Three times a day (TID) | ORAL | Status: DC | PRN

## 2015-11-16 MED ORDER — AMLODIPINE BESYLATE 5 MG PO TABS: 5.0000 mg | ORAL_TABLET | Freq: Every day | ORAL | Status: DC

## 2015-11-16 MED ORDER — SUCRALFATE 1 G PO TABS: 1.0000 g | ORAL_TABLET | Freq: Three times a day (TID) | ORAL | Status: DC

## 2015-11-16 MED FILL — PANTOPRAZOLE SOD DR 40 MG T: 40 | 30 days supply | Qty: 30 | Fill #0

## 2015-11-16 NOTE — Discharge Summary (Signed)
Physician Discharge Summary  Brent Rogers [id number] DOB: 10/03/1965 DOA: 11/12/2015  PCP: Minerva Ends, MD  Admit date: 11/12/2015 Discharge date: 11/16/2015  Time spent: 35 minutes  Recommendations for Outpatient Follow-up:  1. Mr. Buttry admitted for acute on chronic pancreatitis, by day of discharge she was tolerating by mouth intake  2. Please follow-up on blood pressures, he was hypertensive during this hospitalization for which is Norvasc dose was increased   Discharge Diagnoses:  Principal Problem:   Intractable nausea and vomiting Active Problems:   Protein-calorie malnutrition, severe (HCC)   Smoking   HTN (hypertension)   S/P cholecystectomy   Dehydration   Metabolic acidosis   Acute kidney injury ([place])   Hyperkalemia   Fatty liver   Intractable abdominal pain   Elevated alkaline phosphatase level   GERD (gastroesophageal reflux disease)   Discharge Condition: Stable  Diet recommendation: Heart Healthy  Filed Weights   11/12/15 0358 11/12/15 1536  Weight: 66.764 kg (147 lb 3 oz) 68.04 kg (150 lb)    History of present illness:  Brent Rogers is a 50 y.o. male with a past medical history of chronic pancreatitis, status post Whipple procedure for pancreatic mass in 2015, hypertension, scoliosis since emergency department with the chief complaint of intractable abdominal pain, nausea with vomiting. Initial evaluation in the emergency department reveals metabolic acidosis likely secondary to dehydration related to intractable nausea and vomiting, hyperkalemia acute kidney injury.  Information is obtained from the patient. He reports 5 days ago developed abdominal pain. Pain is sharp and constant located in the lower left hand quadrant radiates to the left lumbar spine. 4 days ago he developed nausea with vomiting. Describes the emesis as clear to greenish. He denies coffee ground emesis. He also complains of not being able to keep any fluids down and a  "burning" sensation when he swallows liquids. In addition he describes the sensation of "something being wrong" in his epigastric area. He denies distention diarrhea constipation. He denies fever chills headache dizziness syncope or near-syncope. He denies chest pain palpitations shortness of breath cough.  In the emergency department he is afebrile and hemodynamically stable with an elevated blood pressure tachycardic with a heart rate of 125, he is not hypoxic.  Hospital Course:  #1. Acute on chronic pancreatitis  -Patient presenting with intractable nausea vomiting/abdominal pain. -Suspect symptoms related to acute on chronic pancreatitis as lipase is mildly elevated at 96. Lactic acid within the limits of normal. He is afebrile nontoxic appearing. CT of the abdomen pelvis reveals fatty liver otherwise unremarkable. -During this hospitalization history with IVF's, PRN analgesics and antiemetics  -Lipase level normalizing -His diet was slowly advanced and by 11/16/2015 was tolerating regular diet.  2. Acute kidney injury.  -Likely related to dehydration in the setting of #1. -resolved with IVF's -Encourage oral intake  3. Dehydration/acidosis.  -Improved with IV fluid hydration, IV secondary to GI losses from multiple episodes of nausea vomiting  #4. Hyperkalemia.  -Mild as labs showed potassium 5.3. Questionable hemolyzed.   #5. Elevated alkaline phosphatase in setting of chronic pancreatitis.  - CT reveals fatty liver. Total bili 2.1 -History of same per chart review -Outpatient follow-up with GI for fatty liver recommended   #6. Hypertension.  -Blood pressures elevated, increased Norvasc to 5 mg PO q daily. -Please follow-up on blood pressures hospital follow-up visit   #7. Tobacco use.  -Last cigarette was 2 days ago. -Cessation counseling provided -patient contemplating quitting  #8. Protein calorie malnutrition.  Severe. History of same -Nutritional consult  ordered -will follow rc's  #9. GERD:  -Reports burning with swallowing liquids and sensation of fullness in epigastric area. -Esophagogram with moderate spontaneous reflux; no strictures, hernia or masses  -He was discharged on Protonix and Carafate   Discharge Exam: Filed Vitals:   11/16/15 0439 11/16/15 1314  BP: 132/97 145/96  Pulse: 70 69  Temp: 98.1 F (36.7 C) 97.7 F (36.5 C)  Resp: 18 18    General: He is awake and alert, nontoxic appearing.   Cardiovascular: S1 and S2, no rubs or gallops  Respiratory: CTA bilaterally  Abdomen: mid abd tenderness with palpation, positive BS; mild guarding   Musculoskeletal: no edema or cyanosis  Discharge Instructions   Discharge Instructions    Call MD for:  difficulty breathing, headache or visual disturbances    Complete by:  As directed      Call MD for:  extreme fatigue    Complete by:  As directed      Call MD for:  hives    Complete by:  As directed      Call MD for:  persistant dizziness or light-headedness    Complete by:  As directed      Call MD for:  persistant nausea and vomiting    Complete by:  As directed      Call MD for:  redness, tenderness, or signs of infection (pain, swelling, redness, odor or green/yellow discharge around incision site)    Complete by:  As directed      Call MD for:  severe uncontrolled pain    Complete by:  As directed      Call MD for:  temperature >100.4    Complete by:  As directed      Call MD for:    Complete by:  As directed      Diet - low sodium heart healthy    Complete by:  As directed      Increase activity slowly    Complete by:  As directed           Current Discharge Medication List    START taking these medications   Details  ondansetron (ZOFRAN) 4 MG tablet Take 1 tablet (4 mg total) by mouth every 8 (eight) hours as needed for nausea or vomiting. Qty: 20 tablet, Refills: 0    oxyCODONE (ROXICODONE) 5 MG immediate release tablet Take 1 tablet (5 mg  total) by mouth every 6 (six) hours as needed for severe pain. Qty: 10 tablet, Refills: 0    pantoprazole (PROTONIX) 40 MG tablet Take 1 tablet (40 mg total) by mouth daily. Qty: 30 tablet, Refills: 1    sucralfate (CARAFATE) 1 g tablet Take 1 tablet (1 g total) by mouth 4 (four) times daily -  with meals and at bedtime. Qty: 60 tablet, Refills: 1      CONTINUE these medications which have CHANGED   Details  amLODipine (NORVASC) 5 MG tablet Take 1 tablet (5 mg total) by mouth daily. Qty: 30 tablet, Refills: 1      CONTINUE these medications which have NOT CHANGED   Details  acetaminophen (TYLENOL) 500 MG tablet Take 500 mg by mouth every 6 (six) hours as needed for moderate pain (stomach pain).    bismuth subsalicylate (PEPTO BISMOL) 262 MG/15ML suspension Take 30 mLs by mouth every 6 (six) hours as needed (stomach ache).    Pancrelipase, Lip-Prot-Amyl, 24000 units CPEP Take 1 capsule (24,000 Units total)  by mouth at bedtime. Qty: 90 capsule, Refills: 3   Associated Diagnoses: Exocrine pancreatic insufficiency (HCC)      STOP taking these medications     promethazine (PHENERGAN) 25 MG tablet        No Known Allergies Follow-up Information    Schedule an appointment as soon as possible for a visit with [place].   Contact information:   [address] [place] [phone]       The results of significant diagnostics from this hospitalization (including imaging, microbiology, ancillary and laboratory) are listed below for reference.    Significant Diagnostic Studies: Dg Chest 2 View  11/12/2015  CLINICAL DATA:  Mid chest pain. Shortness of breath. Cough and vomiting after eating for 7 days. EXAM: CHEST  2 VIEW COMPARISON:  05/18/2015 FINDINGS: Hyperinflation suggesting emphysema. Central interstitial changes suggesting chronic bronchitis. Normal heart size and pulmonary vascularity. No focal airspace  disease or consolidation in the lungs. No blunting of costophrenic angles. No pneumothorax. Mediastinal contours appear intact. IMPRESSION: Mild emphysematous and chronic bronchitic changes in the lungs. No evidence of active pulmonary disease. Electronically Signed   By: Lucienne Capers M.D.   On: 11/12/2015 04:26   Ct Chest W Contrast  11/12/2015  CLINICAL DATA:  Chest and abdominal pain for 7 days. The patient feels as if they have a foreign body in the chest. EXAM: CT CHEST, ABDOMEN, AND PELVIS WITH CONTRAST TECHNIQUE: Multidetector CT imaging of the chest, abdomen and pelvis was performed following the standard protocol during bolus administration of intravenous contrast. CONTRAST:  100 mL OMNIPAQUE IOHEXOL 300 MG/ML  SOLN COMPARISON:  CT abdomen and pelvis 06/23/2015 and 03/23/2015. FINDINGS: CT CHEST There is no pleural or pericardial effusion. Heart size is normal. No axillary, hilar or mediastinal lymphadenopathy. The lungs are clear. No foreign body is identified. No bony abnormality is seen. CT ABDOMEN AND PELVIS Again seen is postoperative change of Whipple procedure. The liver is low attenuating consistent with fatty infiltration but no focal liver lesion is seen. Pneumobilia is noted. The adrenal glands and spleen appear normal. Single, small bilateral renal cysts are unchanged. The kidneys are otherwise unremarkable. Scattered aortoiliac atherosclerosis without aneurysm is identified. The colon is normal in appearance. The appendix appears normal. The small bowel is normal appearance with postoperative change noted. No focal bony abnormality is identified. Vacuum disc phenomenon L5-S1 is seen. There is mild convex right scoliosis of the lumbar spine. IMPRESSION: No acute finding chest, abdomen or pelvis. No abnormality to explain the patient's symptoms. Fatty infiltration of the liver. Scattered aortic atherosclerosis. Status post Whipple. Electronically Signed   By: Inge Rise M.D.   On:  11/12/2015 10:58   Ct Abdomen Pelvis W Contrast  11/12/2015  CLINICAL DATA:  Chest and abdominal pain for 7 days. The patient feels as if they have a foreign body in the chest. EXAM: CT CHEST, ABDOMEN, AND PELVIS WITH CONTRAST TECHNIQUE: Multidetector CT imaging of the chest, abdomen and pelvis was performed following the standard protocol during bolus administration of intravenous contrast. CONTRAST:  100 mL OMNIPAQUE IOHEXOL 300 MG/ML  SOLN COMPARISON:  CT abdomen and pelvis 06/23/2015 and 03/23/2015. FINDINGS: CT CHEST There is no pleural or pericardial effusion. Heart size is normal. No axillary, hilar or mediastinal lymphadenopathy. The lungs are clear. No foreign body is identified. No bony abnormality is seen. CT ABDOMEN AND PELVIS Again seen is postoperative change of Whipple procedure. The liver is low attenuating  consistent with fatty infiltration but no focal liver lesion is seen. Pneumobilia is noted. The adrenal glands and spleen appear normal. Single, small bilateral renal cysts are unchanged. The kidneys are otherwise unremarkable. Scattered aortoiliac atherosclerosis without aneurysm is identified. The colon is normal in appearance. The appendix appears normal. The small bowel is normal appearance with postoperative change noted. No focal bony abnormality is identified. Vacuum disc phenomenon L5-S1 is seen. There is mild convex right scoliosis of the lumbar spine. IMPRESSION: No acute finding chest, abdomen or pelvis. No abnormality to explain the patient's symptoms. Fatty infiltration of the liver. Scattered aortic atherosclerosis. Status post Whipple. Electronically Signed   By: Inge Rise M.D.   On: 11/12/2015 10:58   Dg Esophagus  11/13/2015  CLINICAL DATA:  Burning mid esophagus with epigastric fullness. Vomiting. EXAM: ESOPHOGRAM / BARIUM SWALLOW / BARIUM TABLET STUDY TECHNIQUE: Combined double contrast and single contrast examination performed using thick liquid barium and thin  barium liquid. The patient was observed with fluoroscopy swallowing a 13 mm barium sulphate tablet. FLUOROSCOPY TIME:  Fluoroscopy Time:  1 minutes 36 seconds Number of Acquired Images:  20 COMPARISON:  None. FINDINGS: Swallowing and peristalsis are normal. There is no appreciable hiatal hernia. There is reflux of a full column barium to mid esophagus which empties fairly slowly. There is no esophageal stricture, mass, or ulceration. The pharynx appears normal. 13 mm barium tablet passed through the esophagus into the stomach without significant hesitation. IMPRESSION: Moderate spontaneous reflux without hiatal hernia or stricture. Study otherwise unremarkable. Electronically Signed   By: Lowella Grip III M.D.   On: 11/13/2015 14:29    Microbiology: No results found for this or any previous visit (from the past 240 hour(s)).   Labs: Basic Metabolic Panel:  Recent Labs Lab 11/10/15 1535 11/12/15 0445 11/12/15 1144 11/12/15 1608 11/13/15 0816  NA 135 135 136 134* 134*  K 4.5 4.5 5.3* 4.4 3.5  CL 97* 92* 99* 100* 101  CO2 21* 21* 21* 21* 23  GLUCOSE 94 123* 106* 119* 131*  BUN <5* 11 10 7  <5*  CREATININE 0.90 1.25* 1.11 0.78 0.78  CALCIUM 9.4 9.9 8.8* 8.8* 8.8*   Liver Function Tests:  Recent Labs Lab 11/12/15 0430 11/13/15 0816  AST 30 17  ALT 12* 11*  ALKPHOS 163* 111  BILITOT 2.1* 1.3*  PROT 7.6 5.7*  ALBUMIN 4.0 2.9*    Recent Labs Lab 11/12/15 0430 11/13/15 0816  LIPASE 96* 42   No results for input(s): AMMONIA in the last 168 hours. CBC:  Recent Labs Lab 11/10/15 1535 11/12/15 0445 11/13/15 0816  WBC 10.0 11.8* 9.0  HGB 16.4 16.3 13.1  HCT 48.7 49.4 40.1  MCV 85.9 86.8 87.7  PLT 294 268 208   Cardiac Enzymes:  Recent Labs Lab 11/12/15 0445  TROPONINI <0.03   BNP: BNP (last 3 results) No results for input(s): BNP in the last 8760 hours.  ProBNP (last 3 results) No results for input(s): PROBNP in the last 8760 hours.  CBG:  Recent  Labs Lab 11/14/15 0801  GLUCAP 99       Signed:  Kelvin Cellar MD.  [facility] 11/16/2015, 2:35 PM

## 2015-11-16 NOTE — Care Management Note (Signed)
Case Management Note  Patient Details  Name: Brent Rogers MRN: [id number] Date of Birth: [birth year]  Subjective/Objective:                    Action/Plan: Patient listed as Medicaid potential . Spoke with patient confirmed he does not have health insurance , however patient already goes to [employer] and [employer] and is applying for orange card. Patient can get prescriptions filled at [facility] at discharge. He has already used the [facility] letter in the past.  Expected Discharge Date:                  Expected Discharge Plan:  Home/Self Care  In-House Referral:     Discharge planning Services     Post Acute Care Choice:    Choice offered to:  Patient  DME Arranged:    DME Agency:     HH Arranged:    [place] Agency:     Status of Service:  Completed, signed off  Medicare Important Message Given:    Date Medicare IM Given:    Medicare IM give by:    Date Additional Medicare IM Given:    Additional Medicare Important Message give by:     If discussed at [place] of Stay Meetings, dates discussed:    Additional Comments:  Marilu Favre, RN 11/16/2015, 8:49 AM

## 2015-11-16 NOTE — Progress Notes (Signed)
[name] to be D/C'd  per MD order. Discussed with the patient and all questions fully answered.  VSS, Skin clean, dry and intact without evidence of skin break down, no evidence of skin tears noted.  IV catheter discontinued intact. Site without signs and symptoms of complications. Dressing and pressure applied.  An After Visit Summary was printed and given to the patient. Patient received prescription.  D/c education completed with patient/family including follow up instructions, medication list, d/c activities limitations if indicated, with other d/c instructions as indicated by MD - patient able to verbalize understanding, all questions fully answered.   Patient instructed to return to ED, call 911, or call MD for any changes in condition.   Patient to be escorted via [place], and D/C home via private auto.

## 2015-11-23 MED FILL — ?AMLODIPINE BESYLATE 5 MG T: 5 | 30 days supply | Qty: 30 | Fill #0

## 2015-11-27 ENCOUNTER — Encounter: Payer: Self-pay | Admitting: Clinical

## 2015-11-27 NOTE — Progress Notes (Signed)
Depression screen [facility] 2/9 10/15/2015 09/19/2015 04/10/2015 12/26/2014 11/27/2014  Decreased Interest 2 0 0 2 0  Down, Depressed, Hopeless 0 0 0 2 0  PHQ - 2 Score 2 0 0 4 0  Altered sleeping 3 - - 3 -  Tired, decreased energy 3 - - 3 -  Change in appetite 2 - - 2 -  Feeling bad or failure about yourself  0 - - 2 -  Trouble concentrating 0 - - 0 -  Moving slowly or fidgety/restless 0 - - 2 -  Suicidal thoughts 0 - - 0 -  PHQ-9 Score 10 - - 16 -    GAD 7 : Generalized Anxiety Score 10/15/2015  Nervous, Anxious, on Edge 1  Control/stop worrying 1  Worry too much - different things 2  Trouble relaxing 2  Restless 2  Easily annoyed or irritable 0  Afraid - awful might happen 0  Total GAD 7 Score 8

## 2015-11-30 ENCOUNTER — Inpatient Hospital Stay: Payer: Self-pay | Admitting: Family Medicine

## 2015-12-03 ENCOUNTER — Inpatient Hospital Stay: Payer: Self-pay | Admitting: Family Medicine

## 2015-12-25 ENCOUNTER — Ambulatory Visit: Payer: Self-pay | Attending: Family Medicine | Admitting: Family Medicine

## 2015-12-25 ENCOUNTER — Encounter: Payer: Self-pay | Admitting: Family Medicine

## 2015-12-25 VITALS — BP 119/83 | HR 72 | Temp 98.2°F | Resp 16 | Ht 75.0 in | Wt 151.0 lb

## 2015-12-25 DIAGNOSIS — Z79899 Other long term (current) drug therapy: Secondary | ICD-10-CM | POA: Insufficient documentation

## 2015-12-25 DIAGNOSIS — F17210 Nicotine dependence, cigarettes, uncomplicated: Secondary | ICD-10-CM | POA: Insufficient documentation

## 2015-12-25 DIAGNOSIS — K861 Other chronic pancreatitis: Secondary | ICD-10-CM | POA: Insufficient documentation

## 2015-12-25 DIAGNOSIS — L84 Corns and callosities: Secondary | ICD-10-CM | POA: Insufficient documentation

## 2015-12-25 DIAGNOSIS — E860 Dehydration: Secondary | ICD-10-CM | POA: Insufficient documentation

## 2015-12-25 DIAGNOSIS — Z0000 Encounter for general adult medical examination without abnormal findings: Secondary | ICD-10-CM

## 2015-12-25 DIAGNOSIS — K859 Acute pancreatitis without necrosis or infection, unspecified: Secondary | ICD-10-CM

## 2015-12-25 DIAGNOSIS — B351 Tinea unguium: Secondary | ICD-10-CM | POA: Insufficient documentation

## 2015-12-25 DIAGNOSIS — I10 Essential (primary) hypertension: Secondary | ICD-10-CM | POA: Insufficient documentation

## 2015-12-25 DIAGNOSIS — K219 Gastro-esophageal reflux disease without esophagitis: Secondary | ICD-10-CM | POA: Insufficient documentation

## 2015-12-25 MED ORDER — PROMETHAZINE HCL 25 MG PO TABS: 25.0000 mg | ORAL_TABLET | Freq: Three times a day (TID) | ORAL | Status: DC | PRN

## 2015-12-25 MED ORDER — PANTOPRAZOLE SODIUM 40 MG PO TBEC: 40.0000 mg | DELAYED_RELEASE_TABLET | Freq: Every day | ORAL | Status: DC

## 2015-12-25 MED ORDER — AMLODIPINE BESYLATE 5 MG PO TABS: 5.0000 mg | ORAL_TABLET | Freq: Every day | ORAL | Status: DC

## 2015-12-25 MED FILL — PROMETHAZINE 25 MG TABLET: 25 | 10 days supply | Qty: 30 | Fill #0

## 2015-12-25 MED FILL — PANTOPRAZOLE SOD DR 40 MG T: 40 | 30 days supply | Qty: 30 | Fill #0

## 2015-12-25 MED FILL — AMLODIPINE BESYLATE 5 MG TA: 5 | 30 days supply | Qty: 30 | Fill #0

## 2015-12-25 NOTE — Assessment & Plan Note (Signed)
onychomycosis on foot Podiatry referral placed

## 2015-12-25 NOTE — Patient Instructions (Addendum)
Brent Rogers was seen today for follow-up and dehydration.  Diagnoses and all orders for this visit:  Essential hypertension -     amLODipine (NORVASC) 5 MG tablet; Take 1 tablet (5 mg total) by mouth daily.  Healthcare maintenance -     Ambulatory referral to Gastroenterology  Recurrent pancreatitis ([place]) -     promethazine (PHENERGAN) 25 MG tablet; Take 1 tablet (25 mg total) by mouth every 8 (eight) hours as needed for nausea or vomiting.  Gastroesophageal reflux disease, esophagitis presence not specified -     pantoprazole (PROTONIX) 40 MG tablet; Take 1 tablet (40 mg total) by mouth daily.  Foot callus -     Ambulatory referral to Podiatry  Onychomycosis of toenail -     Ambulatory referral to Podiatry   Please get on the schedule for next podiatry clinic  F/u in 3 months, sooner if needed   Dr. Adrian Blackwater

## 2015-12-25 NOTE — Progress Notes (Signed)
HFU Pneumonia No pain today  Stated feeling better, still low energy Tobacco user 3 cigarette per day  No suicidal thoughts in the past two weeks

## 2015-12-25 NOTE — Assessment & Plan Note (Signed)
Callus on foot Podiatry referral placed

## 2015-12-25 NOTE — Progress Notes (Signed)
Subjective:  Patient ID: Brent Rogers, male    DOB: 01-Oct-1965  Age: 50 y.o. MRN: [id number]  CC: Follow-up and Dehydration   HPI LOTUS MCCLENAGHAN presents for    1. HFU for dehydration: hospitalized from 2/13-2/17/17. He has hx of chronic pancreatitis. He became dehydrated in setting of nausea, emesis and diarrhea. His lipase peaked at 96 on day of admission. It was done to 42 on the next day. He denies nausea and emesis. He endorses diarrhea. He has persistent and stable LLQ pain. No fever or chills.   2. Concerns about leg: he has dark spots on his L leg. He is concerned about gangrene in his leg. He has no rash or wounds on his leg.   3. HTN: taking norvasc 10 mg. No leg swelling. No HA, CP, SOB.   Social History  Substance Use Topics  . Smoking status: Current Every Day Smoker -- 0.12 packs/day for 30 years    Types: Cigarettes  . Smokeless tobacco: Former Systems developer    Types: Snuff     Comment: "used snuff 10-15 years in my 20s-30s"  . Alcohol Use: 2.4 oz/week    4 Cans of beer per week     Comment: 11/12/2015 "1 40oz beer and 1 12 oz beer/wk"    Outpatient Prescriptions Prior to Visit  Medication Sig Dispense Refill  . acetaminophen (TYLENOL) 500 MG tablet Take 500 mg by mouth every 6 (six) hours as needed for moderate pain (stomach pain).    Marland Kitchen amLODipine (NORVASC) 5 MG tablet Take 1 tablet (5 mg total) by mouth daily. 30 tablet 1  . bismuth subsalicylate (PEPTO BISMOL) 262 MG/15ML suspension Take 30 mLs by mouth every 6 (six) hours as needed (stomach ache).    . ondansetron (ZOFRAN) 4 MG tablet Take 1 tablet (4 mg total) by mouth every 8 (eight) hours as needed for nausea or vomiting. 20 tablet 0  . oxyCODONE (ROXICODONE) 5 MG immediate release tablet Take 1 tablet (5 mg total) by mouth every 6 (six) hours as needed for severe pain. 10 tablet 0  . Pancrelipase, Lip-Prot-Amyl, 24000 units CPEP Take 1 capsule (24,000 Units total) by mouth at bedtime. 90 capsule 3  .  pantoprazole (PROTONIX) 40 MG tablet Take 1 tablet (40 mg total) by mouth daily. 30 tablet 1  . sucralfate (CARAFATE) 1 g tablet Take 1 tablet (1 g total) by mouth 4 (four) times daily -  with meals and at bedtime. 60 tablet 1   No facility-administered medications prior to visit.    ROS Review of Systems  Constitutional: Negative for fever, chills, fatigue and unexpected weight change.  Eyes: Negative for visual disturbance.  Respiratory: Negative for cough and shortness of breath.   Cardiovascular: Negative for chest pain, palpitations and leg swelling.  Gastrointestinal: Positive for diarrhea. Negative for nausea, vomiting, abdominal pain, constipation and blood in stool.  Endocrine: Negative for polydipsia, polyphagia and polyuria.  Musculoskeletal: Negative for myalgias, back pain, arthralgias, gait problem and neck pain.  Skin: Negative for rash.  Allergic/Immunologic: Negative for immunocompromised state.  Hematological: Negative for adenopathy. Does not bruise/bleed easily.  Psychiatric/Behavioral: Negative for suicidal ideas, sleep disturbance and dysphoric mood. The patient is not nervous/anxious.     Objective:  BP 119/83 mmHg  Pulse 72  Temp(Src) 98.2 F (36.8 C) (Oral)  Resp 16  Ht 6\' 3"  (1.905 m)  Wt 151 lb (68.493 kg)  BMI 18.87 kg/m2  SpO2 99%  BP/Weight 12/25/2015 11/16/2015 11/12/2015  Systolic BP 123456 [id number] -  Diastolic BP 83 96 -  Wt. (Lbs) 151 - 150  BMI 18.87 - 18.75   Physical Exam  Constitutional: He appears well-developed and well-nourished. No distress.  HENT:  Head: Normocephalic and atraumatic.  Neck: Normal range of motion. Neck supple.  Cardiovascular: Normal rate, regular rhythm, normal heart sounds and intact distal pulses.   Pulmonary/Chest: Effort normal and breath sounds normal.  Abdominal: Soft. Bowel sounds are normal. He exhibits no distension and no mass. There is tenderness in the left lower quadrant. There is no rebound and no guarding.    Musculoskeletal: He exhibits no edema.       Feet:  Neurological: He is alert.  Skin: Skin is warm and dry. No rash noted. No erythema.  Psychiatric: He has a normal mood and affect.   Lab Results  Component Value Date   HGBA1C 5.60 10/15/2015    Assessment & Plan:   There are no diagnoses linked to this encounter. Rodrigues was seen today for follow-up and dehydration.  Diagnoses and all orders for this visit:  Essential hypertension -     amLODipine (NORVASC) 5 MG tablet; Take 1 tablet (5 mg total) by mouth daily.  Healthcare maintenance -     Ambulatory referral to Gastroenterology  Recurrent pancreatitis ([place]) -     promethazine (PHENERGAN) 25 MG tablet; Take 1 tablet (25 mg total) by mouth every 8 (eight) hours as needed for nausea or vomiting.  Gastroesophageal reflux disease, esophagitis presence not specified -     pantoprazole (PROTONIX) 40 MG tablet; Take 1 tablet (40 mg total) by mouth daily.  Foot callus -     Ambulatory referral to Podiatry  Onychomycosis of toenail -     Ambulatory referral to Podiatry   Meds ordered this encounter  Medications  . amLODipine (NORVASC) 5 MG tablet    Sig: Take 1 tablet (5 mg total) by mouth daily.    Dispense:  30 tablet    Refill:  11  . promethazine (PHENERGAN) 25 MG tablet    Sig: Take 1 tablet (25 mg total) by mouth every 8 (eight) hours as needed for nausea or vomiting.    Dispense:  30 tablet    Refill:  5  . pantoprazole (PROTONIX) 40 MG tablet    Sig: Take 1 tablet (40 mg total) by mouth daily.    Dispense:  30 tablet    Refill:  5    Follow-up: No Follow-up on file.   Boykin Nearing MD

## 2015-12-25 NOTE — Assessment & Plan Note (Signed)
A: HTN well controlled P: Continue norvasc 10 mg daily

## 2016-01-29 MED FILL — PROMETHAZINE 25 MG TABLET: 25 | 10 days supply | Qty: 30 | Fill #1

## 2016-01-29 MED FILL — ?AMLODIPINE BESYLATE 5 MG T: 5 | 30 days supply | Qty: 30 | Fill #1

## 2016-02-28 MED FILL — ?AMLODIPINE BESYLATE 5 MG T: 5 | 30 days supply | Qty: 30 | Fill #2

## 2016-02-28 MED FILL — ?PANTOPRAZOLE SOD DR 40MG: 40 MG | 30 days supply | Qty: 30 | Fill #1

## 2016-02-28 MED FILL — PROMETHAZINE 25 MG TABLET: 25 | 10 days supply | Qty: 30 | Fill #2

## 2016-03-06 ENCOUNTER — Encounter (HOSPITAL_COMMUNITY): Payer: Self-pay | Admitting: Emergency Medicine

## 2016-03-06 ENCOUNTER — Inpatient Hospital Stay (HOSPITAL_COMMUNITY)
Admission: EM | Admit: 2016-03-06 | Discharge: 2016-03-11 | DRG: 439 | Disposition: A | Discharge status: Home / Self Care | Payer: Self-pay | Attending: Internal Medicine | Admitting: Internal Medicine

## 2016-03-06 ENCOUNTER — Emergency Department (HOSPITAL_COMMUNITY): Payer: Self-pay

## 2016-03-06 DIAGNOSIS — Z79899 Other long term (current) drug therapy: Secondary | ICD-10-CM

## 2016-03-06 DIAGNOSIS — I10 Essential (primary) hypertension: Secondary | ICD-10-CM | POA: Diagnosis present

## 2016-03-06 DIAGNOSIS — R64 Cachexia: Secondary | ICD-10-CM | POA: Diagnosis present

## 2016-03-06 DIAGNOSIS — L97509 Non-pressure chronic ulcer of other part of unspecified foot with unspecified severity: Secondary | ICD-10-CM

## 2016-03-06 DIAGNOSIS — K8681 Exocrine pancreatic insufficiency: Secondary | ICD-10-CM | POA: Diagnosis present

## 2016-03-06 DIAGNOSIS — Z8507 Personal history of malignant neoplasm of pancreas: Secondary | ICD-10-CM

## 2016-03-06 DIAGNOSIS — F17210 Nicotine dependence, cigarettes, uncomplicated: Secondary | ICD-10-CM | POA: Diagnosis present

## 2016-03-06 DIAGNOSIS — Z8249 Family history of ischemic heart disease and other diseases of the circulatory system: Secondary | ICD-10-CM

## 2016-03-06 DIAGNOSIS — R1110 Vomiting, unspecified: Secondary | ICD-10-CM | POA: Diagnosis present

## 2016-03-06 DIAGNOSIS — Z809 Family history of malignant neoplasm, unspecified: Secondary | ICD-10-CM

## 2016-03-06 DIAGNOSIS — L97519 Non-pressure chronic ulcer of other part of right foot with unspecified severity: Secondary | ICD-10-CM | POA: Diagnosis present

## 2016-03-06 DIAGNOSIS — Z681 Body mass index (BMI) 19 or less, adult: Secondary | ICD-10-CM

## 2016-03-06 DIAGNOSIS — M65879 Other synovitis and tenosynovitis, unspecified ankle and foot: Secondary | ICD-10-CM | POA: Diagnosis present

## 2016-03-06 DIAGNOSIS — Z720 Tobacco use: Secondary | ICD-10-CM | POA: Diagnosis present

## 2016-03-06 DIAGNOSIS — K859 Acute pancreatitis without necrosis or infection, unspecified: Principal | ICD-10-CM | POA: Diagnosis present

## 2016-03-06 DIAGNOSIS — L97529 Non-pressure chronic ulcer of other part of left foot with unspecified severity: Secondary | ICD-10-CM | POA: Diagnosis present

## 2016-03-06 DIAGNOSIS — R109 Unspecified abdominal pain: Secondary | ICD-10-CM

## 2016-03-06 DIAGNOSIS — Z90411 Acquired partial absence of pancreas: Secondary | ICD-10-CM

## 2016-03-06 DIAGNOSIS — K858 Other acute pancreatitis without necrosis or infection: Secondary | ICD-10-CM

## 2016-03-06 DIAGNOSIS — K219 Gastro-esophageal reflux disease without esophagitis: Secondary | ICD-10-CM | POA: Diagnosis present

## 2016-03-06 LAB — COMPREHENSIVE METABOLIC PANEL
ALT: 41 U/L (ref 17–63)
AST: 34 U/L (ref 15–41)
Albumin: 4.1 g/dL (ref 3.5–5.0)
Alkaline Phosphatase: 113 U/L (ref 38–126)
Anion gap: 12 (ref 5–15)
BUN: <5 mg/dL — ABNORMAL LOW (ref 6–20)
CO2: 21 mmol/L — ABNORMAL LOW (ref 22–32)
Calcium: 9.5 mg/dL (ref 8.9–10.3)
Chloride: 102 mmol/L (ref 101–111)
Creatinine, Ser: 0.83 mg/dL (ref 0.61–1.24)
GFR calc Af Amer: >60 mL/min (ref >60)
GFR calc non Af Amer: >60 mL/min (ref >60)
Glucose, Bld: 153 mg/dL — ABNORMAL HIGH (ref 65–99)
Potassium: 3.5 mmol/L (ref 3.5–5.1)
Sodium: 135 mmol/L (ref 135–145)
Total Bilirubin: 1 mg/dL (ref 0.3–1.2)
Total Protein: 6.8 g/dL (ref 6.5–8.1)

## 2016-03-06 LAB — CBC
HCT: 43.7 % (ref 39.0–52.0)
Hemoglobin: 14.5 g/dL (ref 13.0–17.0)
MCH: 28.4 pg (ref 26.0–34.0)
MCHC: 33.2 g/dL (ref 30.0–36.0)
MCV: 85.5 fL (ref 78.0–100.0)
Platelets: 198 10*3/uL (ref 150–400)
RBC: 5.11 MIL/uL (ref 4.22–5.81)
RDW: 13.8 % (ref 11.5–15.5)
WBC: 13.6 10*3/uL — ABNORMAL HIGH (ref 4.0–10.5)

## 2016-03-06 LAB — I-STAT TROPONIN, ED: Troponin i, poc: 0 ng/mL (ref 0.00–0.08)

## 2016-03-06 LAB — LIPASE, BLOOD: Lipase: 94 U/L — ABNORMAL HIGH (ref 11–51)

## 2016-03-06 MED ORDER — SODIUM CHLORIDE 0.9 % IV BOLUS (SEPSIS)
1000.0000 mL | Freq: Once | INTRAVENOUS | Status: AC
Start: 2016-03-06 — End: 2016-03-07
  Administered 2016-03-06: 1000 mL via INTRAVENOUS

## 2016-03-06 MED ORDER — ONDANSETRON HCL 4 MG/2ML IJ SOLN
4.0000 mg | Freq: Once | INTRAMUSCULAR | Status: AC | PRN
  Administered 2016-03-06: 4 mg via INTRAVENOUS
  Filled 2016-03-06: qty 2

## 2016-03-06 MED ORDER — FENTANYL CITRATE (PF) 100 MCG/2ML IJ SOLN
50.0000 ug | INTRAMUSCULAR | Status: DC | PRN
  Administered 2016-03-06: 50 ug via INTRAVENOUS
  Filled 2016-03-06: qty 2

## 2016-03-06 MED ORDER — HYDROMORPHONE HCL 1 MG/ML IJ SOLN
1.0000 mg | Freq: Once | INTRAMUSCULAR | Status: AC
  Administered 2016-03-06: 1 mg via INTRAVENOUS
  Filled 2016-03-06: qty 1

## 2016-03-06 NOTE — ED Notes (Signed)
Pt brought to ED by [facility] from home for 10/10 epigastric pain and mid CP, pt looks very diaphoretic on arrival to ED,. 324 mg ASA and 0.4 mg Nitro SL given by EMS, pt has a hx of HTN and Pancreatitis.  VS 151/94, HR 118, SPO2 98%.

## 2016-03-06 NOTE — ED Notes (Signed)
Patient transported to X-ray 

## 2016-03-07 ENCOUNTER — Observation Stay (HOSPITAL_COMMUNITY): Payer: Self-pay

## 2016-03-07 ENCOUNTER — Emergency Department (HOSPITAL_COMMUNITY): Payer: Self-pay

## 2016-03-07 DIAGNOSIS — R1110 Vomiting, unspecified: Secondary | ICD-10-CM | POA: Diagnosis present

## 2016-03-07 DIAGNOSIS — Z720 Tobacco use: Secondary | ICD-10-CM

## 2016-03-07 DIAGNOSIS — K859 Acute pancreatitis without necrosis or infection, unspecified: Secondary | ICD-10-CM | POA: Diagnosis present

## 2016-03-07 LAB — BASIC METABOLIC PANEL
Anion gap: 9 (ref 5–15)
BUN: <5 mg/dL — ABNORMAL LOW (ref 6–20)
CO2: 26 mmol/L (ref 22–32)
Calcium: 8.6 mg/dL — ABNORMAL LOW (ref 8.9–10.3)
Chloride: 103 mmol/L (ref 101–111)
Creatinine, Ser: 0.77 mg/dL (ref 0.61–1.24)
GFR calc Af Amer: >60 mL/min (ref >60)
GFR calc non Af Amer: >60 mL/min (ref >60)
Glucose, Bld: 143 mg/dL — ABNORMAL HIGH (ref 65–99)
Potassium: 4.1 mmol/L (ref 3.5–5.1)
Sodium: 138 mmol/L (ref 135–145)

## 2016-03-07 LAB — URINE MICROSCOPIC-ADD ON
RBC / HPF: NONE SEEN RBC/hpf (ref 0–5)
WBC, UA: NONE SEEN WBC/hpf (ref 0–5)

## 2016-03-07 LAB — CBC
HCT: 41.3 % (ref 39.0–52.0)
Hemoglobin: 13.1 g/dL (ref 13.0–17.0)
MCH: 27.5 pg (ref 26.0–34.0)
MCHC: 31.7 g/dL (ref 30.0–36.0)
MCV: 86.8 fL (ref 78.0–100.0)
Platelets: 175 10*3/uL (ref 150–400)
RBC: 4.76 MIL/uL (ref 4.22–5.81)
RDW: 14.1 % (ref 11.5–15.5)
WBC: 15.2 10*3/uL — ABNORMAL HIGH (ref 4.0–10.5)

## 2016-03-07 LAB — URINALYSIS, ROUTINE W REFLEX MICROSCOPIC
Glucose, UA: NEGATIVE mg/dL
Hgb urine dipstick: NEGATIVE
Ketones, ur: 15 mg/dL — AB
Leukocytes, UA: NEGATIVE
Nitrite: NEGATIVE
Protein, ur: 30 mg/dL — AB
Specific Gravity, Urine: 1.027 (ref 1.005–1.030)
pH: 7 (ref 5.0–8.0)

## 2016-03-07 LAB — PREALBUMIN: Prealbumin: 30.1 mg/dL (ref 18–38)

## 2016-03-07 MED ORDER — PROCHLORPERAZINE EDISYLATE 5 MG/ML IJ SOLN
10.0000 mg | INTRAMUSCULAR | Status: DC | PRN
  Administered 2016-03-08 – 2016-03-10 (×4): 10 mg via INTRAVENOUS
  Filled 2016-03-07 (×6): qty 2

## 2016-03-07 MED ORDER — NICOTINE 21 MG/24HR TD PT24
21.0000 mg | MEDICATED_PATCH | Freq: Every day | TRANSDERMAL | Status: DC
  Filled 2016-03-07 (×2): qty 1

## 2016-03-07 MED ORDER — ENOXAPARIN SODIUM 40 MG/0.4ML ~~LOC~~ SOLN
40.0000 mg | SUBCUTANEOUS | Status: DC
  Administered 2016-03-09 – 2016-03-11 (×3): 40 mg via SUBCUTANEOUS
  Filled 2016-03-07 (×3): qty 0.4

## 2016-03-07 MED ORDER — SODIUM CHLORIDE 0.9 % IV SOLN
INTRAVENOUS | Status: AC
  Administered 2016-03-07: 05:00:00 via INTRAVENOUS

## 2016-03-07 MED ORDER — ACETAMINOPHEN 650 MG RE SUPP: 650.0000 mg | Freq: Four times a day (QID) | RECTAL | Status: DC | PRN

## 2016-03-07 MED ORDER — FENTANYL CITRATE (PF) 100 MCG/2ML IJ SOLN: 50.0000 ug | INTRAMUSCULAR | Status: DC | PRN

## 2016-03-07 MED ORDER — ACETAMINOPHEN 500 MG PO TABS: 1000.0000 mg | ORAL_TABLET | Freq: Four times a day (QID) | ORAL | Status: DC | PRN

## 2016-03-07 MED ORDER — METOCLOPRAMIDE HCL 5 MG/ML IJ SOLN
10.0000 mg | Freq: Once | INTRAMUSCULAR | Status: AC
  Administered 2016-03-07: 10 mg via INTRAVENOUS
  Filled 2016-03-07: qty 2

## 2016-03-07 MED ORDER — OXYCODONE-ACETAMINOPHEN 5-325 MG PO TABS
1.0000 | ORAL_TABLET | ORAL | Status: DC | PRN
  Administered 2016-03-07 – 2016-03-11 (×5): 2 via ORAL
  Filled 2016-03-07 (×5): qty 2

## 2016-03-07 MED ORDER — HYDROMORPHONE HCL 1 MG/ML IJ SOLN
1.0000 mg | INTRAMUSCULAR | Status: DC | PRN
  Administered 2016-03-07 – 2016-03-08 (×7): 1 mg via INTRAVENOUS
  Filled 2016-03-07 (×7): qty 1

## 2016-03-07 MED ORDER — AMLODIPINE BESYLATE 5 MG PO TABS
5.0000 mg | ORAL_TABLET | Freq: Every day | ORAL | Status: DC
  Administered 2016-03-07 – 2016-03-11 (×5): 5 mg via ORAL
  Filled 2016-03-07 (×5): qty 1

## 2016-03-07 MED ORDER — ONDANSETRON HCL 4 MG/2ML IJ SOLN
4.0000 mg | Freq: Three times a day (TID) | INTRAMUSCULAR | Status: AC | PRN
  Administered 2016-03-07: 4 mg via INTRAVENOUS
  Filled 2016-03-07: qty 2

## 2016-03-07 MED ORDER — PANTOPRAZOLE SODIUM 40 MG PO TBEC
40.0000 mg | DELAYED_RELEASE_TABLET | Freq: Every day | ORAL | Status: DC
  Administered 2016-03-07 – 2016-03-11 (×5): 40 mg via ORAL
  Filled 2016-03-07 (×5): qty 1

## 2016-03-07 MED ORDER — PANCRELIPASE (LIP-PROT-AMYL) 12000-38000 UNITS PO CPEP
24000.0000 [IU] | ORAL_CAPSULE | Freq: Every day | ORAL | Status: DC
  Administered 2016-03-07 – 2016-03-10 (×4): 24000 [IU] via ORAL
  Filled 2016-03-07 (×5): qty 2

## 2016-03-07 MED ORDER — IOPAMIDOL (ISOVUE-300) INJECTION 61%
INTRAVENOUS | Status: AC
  Administered 2016-03-07: 100 mL
  Filled 2016-03-07: qty 100

## 2016-03-07 MED ORDER — HYDROMORPHONE HCL 1 MG/ML IJ SOLN
0.5000 mg | Freq: Once | INTRAMUSCULAR | Status: AC
Start: 2016-03-07 — End: 2016-03-07
  Administered 2016-03-07: 0.5 mg via INTRAVENOUS
  Filled 2016-03-07: qty 1

## 2016-03-07 MED ORDER — ACETAMINOPHEN 325 MG PO TABS: 650.0000 mg | ORAL_TABLET | Freq: Four times a day (QID) | ORAL | Status: DC | PRN

## 2016-03-07 MED ORDER — PROMETHAZINE HCL 25 MG/ML IJ SOLN
12.5000 mg | Freq: Once | INTRAMUSCULAR | Status: AC
Start: 2016-03-07 — End: 2016-03-07
  Administered 2016-03-07: 12.5 mg via INTRAVENOUS
  Filled 2016-03-07: qty 1

## 2016-03-07 NOTE — Progress Notes (Signed)
Initial Nutrition Assessment  DOCUMENTATION CODES:   Severe malnutrition in context of chronic illness  INTERVENTION:  Once diet advances, provide 30 ml Prostat po TID, each supplement provides 100 kcal and 15 grams of protein.   NUTRITION DIAGNOSIS:   Malnutrition related to chronic illness as evidenced by severe depletion of body fat, severe depletion of muscle mass.  GOAL:   Patient will meet greater than or equal to 90% of their needs  MONITOR:   Diet advancement, Weight trends, Labs, I & O's  REASON FOR ASSESSMENT:   Malnutrition Screening Tool    ASSESSMENT:   50 y.o. male with medical history significant of pancreatic cancer s/p Whipple in 2015 and HTN; who presents with complaints of abdominal and chest pain. Pain is sharp, constant, and located in the left chest and epigastric region.  Pt is currently NPO. Pt reports abdominal pain is still a 10 out of 10 on pain scale. Pt reports over the past 2-3 days, pt has been unable to keep any foods or liquids down. Prior to abdominal pains, pt reports eating well usually with 2-3 meals a day. Usual body weight unknown to pt however per Epic weight records, weight has been stable. Pt reports he dislikes Ensure/Boost. RD to order Prostat once diet advances.   Nutrition-Focused physical exam completed. Findings are severe fat depletion, moderate to severe muscle depletion, and no edema.   Labs and medications reviewed.   Diet Order:  Diet NPO time specified  Skin:  Reviewed, no issues  Last BM:  6/8  Height:   Ht Readings from Last 1 Encounters:  03/06/16 6\' 3"  (1.905 m)    Weight:   Wt Readings from Last 1 Encounters:  03/06/16 151 lb (68.493 kg)    Ideal Body Weight:  89 kg  BMI:  Body mass index is 18.87 kg/(m^2).  Estimated Nutritional Needs:   Kcal:  2000-2200  Protein:  105-120 grams  Fluid:  2-2.2 L/day  EDUCATION NEEDS:   No education needs identified at this time  Corrin Parker, MS, RD,  LDN Pager # [phone] After hours/ weekend pager # [phone]

## 2016-03-07 NOTE — H&P (Signed)
History and Physical    Brent Rogers [id number] DOB: 02-Mar-1966 DOA: 03/06/2016  Referring MD/NP/PA: Judeen Hammans PCP: Minerva Ends, MD  Patient coming from: Home  Chief Complaint:  Abdominal and chest pain  HPI: Brent Rogers is a 50 y.o. male with medical history significant of pancreatic cancer s/p Whipple in 2015 and HTN; who presents with complaints of abdominal and chest pain. Pain is sharp, constant, and located in the left chest and epigastric region. Rates pain as a 10 out of 10 on the pain scale. Associated symptoms include some shortness of breath, nausea, and vomiting. Emesis is nonbloody and does not appear bilious. Patient states he's been unable to keep any food or liquids down. Denies any fever, chills, rash, focal weakness, lymphadenopathy, or dysuria. Last hospitalized with similar symptoms back in 10/2015 found to have pancreatitis. He was noted to have elevated lipase which was note to be back within normal limits prior to him being discharged charged home. Patient notes that he last drank a beer 3 days ago. He reports that he had a good appetite prior to the onset of these symptoms, but still seems to be losing weight. He reports that his primary care doctor is monitoring him for any signs of  cancer recurrence.   ED Course: On arrival to the emergency department patient was evaluated and seen to to be afebrile, pulse of 10/30/1938, respirations of 224, blood pressure as low as 93/78, O2 saturations maintain on room air. patient was given 324 mg of aspirin and 0.4 mg sublingual nitroglycerin. Patient complaining of persistent epigastric pain unable to tolerate po after being given Zofran, Reglan, Dilaudid, and  fentanyl. Blood pressures responded to IV fluids. Lab work revealed WBC 13.6, troponin negative, lipase 94. Chest x-ray showed no acute abnormalities. Urinalysis was positive for ketones, but negative for signs of infection. [facility] consulted to admit as patient still  reporting pain controlled with continued nausea and vomiting.   Review of Systems: As per HPI otherwise 10 point review of systems negative.   Past Medical History  Diagnosis Date  . Scoliosis   . Pancreatic abnormality     CT  shows mass  . Chronic pancreatitis ([place])     S/P Whipple  . Hypertension   . Pneumonia 11/12/2015  . Migraine     "@ least once/month" (11/12/2015)    Past Surgical History  Procedure Laterality Date  . Lumbar disc surgery  1990's  . Finger fracture surgery Left 1995    5th digit  . Eus N/A 09/21/2013    Procedure: ESOPHAGEAL ENDOSCOPIC ULTRASOUND (EUS) RADIAL;  Surgeon: Beryle Beams, MD;  Location: [facility];  Service: Endoscopy;  Laterality: N/A;  . Fine needle aspiration N/A 09/21/2013    Procedure: FINE NEEDLE ASPIRATION (FNA) LINEAR;  Surgeon: Beryle Beams, MD;  Location: [facility];  Service: Endoscopy;  Laterality: N/A;  . Eus N/A 09/30/2013    Procedure: UPPER ENDOSCOPIC ULTRASOUND (EUS) LINEAR;  Surgeon: Beryle Beams, MD;  Location: [facility];  Service: Endoscopy;  Laterality: N/A;  . Laparoscopy N/A 12/01/2013    Procedure: LAPAROSCOPY DIAGNOSTIC PANCREATICODUODENECTOMY WITH BILIARY AND PANCREATIC STENTS;  Surgeon: Stark Klein, MD;  Location: [facility];  Service: General;  Laterality: N/A;  . Whipple procedure  12/01/2013  . Fracture surgery    . Back surgery    . Knee arthroscopy Bilateral 1987-1989    right-left     reports that he has been smoking Cigarettes.  He has a 3.6  pack-year smoking history. He has quit using smokeless tobacco. His smokeless tobacco use included Snuff. He reports that he drinks about 2.4 oz of alcohol per week. He reports that he does not use illicit drugs.  No Known Allergies  Family History  Problem Relation Age of Onset  . Cancer Mother     unsure  . Hypertension Father     Prior to Admission medications   Medication Sig Start Date End Date Taking? Authorizing Provider  acetaminophen (TYLENOL) 500  MG tablet Take 1,000 mg by mouth every 6 (six) hours as needed for moderate pain (stomach pain).    Yes Historical Provider, MD  amLODipine (NORVASC) 5 MG tablet Take 1 tablet (5 mg total) by mouth daily. 12/25/15  Yes Josalyn Funches, MD  bismuth subsalicylate (PEPTO BISMOL) 262 MG/15ML suspension Take 30 mLs by mouth every 6 (six) hours as needed (stomach ache).   Yes Historical Provider, MD  Pancrelipase, Lip-Prot-Amyl, 24000 units CPEP Take 1 capsule (24,000 Units total) by mouth at bedtime. 10/15/15  Yes Josalyn Funches, MD  pantoprazole (PROTONIX) 40 MG tablet Take 1 tablet (40 mg total) by mouth daily. 12/25/15  Yes Boykin Nearing, MD  promethazine (PHENERGAN) 25 MG tablet Take 1 tablet (25 mg total) by mouth every 8 (eight) hours as needed for nausea or vomiting. 12/25/15  Yes Boykin Nearing, MD    Physical Exam:  Constitutional: Chronically ill-appearing and cachectic male in no acute distress at this time. Filed Vitals:   03/07/16 0230 03/07/16 0245 03/07/16 0300 03/07/16 0315  BP: 148/91 143/106 141/101 136/97  Pulse: 74 80 85 78  Temp:      TempSrc:      Resp: 14 14 21 17   Height:      Weight:      SpO2: 99% 99% 97% 97%   Eyes: PERRL, lids and conjunctivae normal, Sclerae anicteric  ENMT: Mucous membranes are Dry. Posterior pharynx clear of any exudate or lesions.Normal dentition.  Neck: normal, supple, no masses, no thyromegaly Respiratory: clear to auscultation bilaterally, no wheezing, no crackles. Normal respiratory effort. No accessory muscle use.  Cardiovascular: Regular rate and rhythm, no murmurs / rubs / gallops. No extremity edema. 2+ pedal pulses. No carotid bruits.  Abdomen: Mild tenderness to palpation of the left upper quadrant, no masses palpated. No hepatosplenomegaly. Bowel sounds positive.  Musculoskeletal: no clubbing / cyanosis. No joint deformity upper and lower extremities. Good ROM, no contractures. Normal muscle tone.  Skin: no rashes, lesions, ulcers.  No induration Neurologic: CN 2-12 grossly intact. Sensation intact, DTR normal. Strength 5/5 in all 4.  Psychiatric: Normal judgment and insight. Alert and oriented x 3. Normal mood.     Labs on Admission: I have personally reviewed following labs and imaging studies  CBC:  Recent Labs Lab 03/06/16 2145  WBC 13.6*  HGB 14.5  HCT 43.7  MCV 85.5  PLT 99991111   Basic Metabolic Panel:  Recent Labs Lab 03/06/16 2145  NA 135  K 3.5  CL 102  CO2 21*  GLUCOSE 153*  BUN <5*  CREATININE 0.83  CALCIUM 9.5   GFR: Estimated Creatinine Clearance: 103.2 mL/min (by C-G formula based on Cr of 0.83). Liver Function Tests:  Recent Labs Lab 03/06/16 2145  AST 34  ALT 41  ALKPHOS 113  BILITOT 1.0  PROT 6.8  ALBUMIN 4.1    Recent Labs Lab 03/06/16 2145  LIPASE 94*   No results for input(s): AMMONIA in the last 168 hours. Coagulation Profile: No results  for input(s): INR, PROTIME in the last 168 hours. Cardiac Enzymes: No results for input(s): CKTOTAL, CKMB, CKMBINDEX, TROPONINI in the last 168 hours. BNP (last 3 results) No results for input(s): PROBNP in the last 8760 hours. HbA1C: No results for input(s): HGBA1C in the last 72 hours. CBG: No results for input(s): GLUCAP in the last 168 hours. Lipid Profile: No results for input(s): CHOL, HDL, LDLCALC, TRIG, CHOLHDL, LDLDIRECT in the last 72 hours. Thyroid Function Tests: No results for input(s): TSH, T4TOTAL, FREET4, T3FREE, THYROIDAB in the last 72 hours. Anemia Panel: No results for input(s): VITAMINB12, FOLATE, FERRITIN, TIBC, IRON, RETICCTPCT in the last 72 hours. Urine analysis:    Component Value Date/Time   COLORURINE AMBER* 03/06/2016 [place] 03/06/2016 2339   LABSPEC 1.027 03/06/2016 2339   PHURINE 7.0 03/06/2016 2339   GLUCOSEU NEGATIVE 03/06/2016 2339   HGBUR NEGATIVE 03/06/2016 2339   BILIRUBINUR SMALL* 03/06/2016 2339   KETONESUR 15* 03/06/2016 2339   PROTEINUR 30* 03/06/2016  2339   UROBILINOGEN 1.0 06/23/2015 1513   NITRITE NEGATIVE 03/06/2016 2339   LEUKOCYTESUR NEGATIVE 03/06/2016 2339   Sepsis Labs: No results found for this or any previous visit (from the past 240 hour(s)).   Radiological Exams on Admission: Dg Chest 2 View  03/06/2016  CLINICAL DATA:  50 year old male with chest pain EXAM: CHEST  2 VIEW COMPARISON:  Chest CT dated 11/12/2015 FINDINGS: The heart size and mediastinal contours are within normal limits. Both lungs are clear. The visualized skeletal structures are unremarkable. IMPRESSION: No active cardiopulmonary disease. Electronically Signed   By: Anner Crete M.D.   On: 03/06/2016 22:28   Dg Abd 2 Views  03/07/2016  CLINICAL DATA:  Abdominal pain with nausea and vomiting EXAM: ABDOMEN - 2 VIEW COMPARISON:  06/23/2015 CT FINDINGS: Postsurgical changes in the left upper quadrant. Patient is status post Whipple procedure. Normal bowel gas pattern. No concerning intra-abdominal mass effect or calcification. Lung bases are clear. No pneumoperitoneum. IMPRESSION: Normal bowel gas pattern. Electronically Signed   By: Monte Fantasia M.D.   On: 03/07/2016 03:49    EKG: Independently reviewed. Sinus rhythm with premature atrial complexes.  Assessment/Plan Pancreatitis: Acute on chronic. Patient with complaints of left-sided epigastric chest pain. Found to have elevated lipase of 95. Last reports drinking a beer 3 days prior. - Admit to MedSurg bed - IV fluids of normal saline 123ml/hr - Advance diet as tolerated - Pain management with fentanyl/ Dil - Check CT of the abdomen and pelvis with contrast - Discussed with the patient possible triggers for recurrent episodes of pancreatitis including alcohol.  Intractable nausea and vomiting  - Zofran/Compazine prn N/V  Leukocytosis: Acute. WBC elevated at 13.6. Suspect that this is no clear infectious source seen at this time. - Will monitor off antibiotics for now - Repeat CBC in  a.m.  Essential hypertension - Continue amlodipine  Tobacco abuse: Patient reports smoking approximately 1 pack of cigarettes per week - Nicotine patch offered  GERD - Continue protonix   DVT prophylaxis: lovenox Code Status: Full Family Communication: None  Disposition Plan: Possible discharge when tolerating by mouth Consults called: None Admission status: Obstipation MedSurg  Norval Morton MD [facility] Pager [phone]  If 7PM-7AM, please contact night-coverage [email or web address] Password [email or web address]  03/07/2016, 4:11 AM

## 2016-03-07 NOTE — ED Notes (Signed)
Patient transported to X-ray 

## 2016-03-07 NOTE — Consult Note (Signed)
WOC wound consult note Reason for Consult: leg ulcers, however it is noted areas of concern are not on the patients legs but are instead on the plantar surface of the bilateral feet.  Patient reports they are painful to walk on Wound type: callous areas x 4 Measurement: 4 circular areas (2 on the left/2 on the right) Wound bed:dry, callous skin Drainage (amount, consistency, odor) none Periwound: hyperkeratotic skin Dressing procedure/placement/frequency: No topical care needed.  If the areas are painful or worrisome to the patient wound suggest follow up in the wound care center or a podiatrist of his choice.  Discussed POC with patient and bedside nurse.  Re consult if needed, will not follow at this time. Thanks  Lloyd Ayo Kellogg, [place] [phone])

## 2016-03-07 NOTE — Progress Notes (Signed)
Brent Rogers is a 50 y.o. male with medical history significant of pancreatic cancer s/p Whipple in 2015 and HTN; who presents with complaints of epigastric pain, he was found to have acute pancreatitis , possibly from alcohol intake.  His pain was not well controlle.d added on oxycodone .  Continue with iv FLUIDS , recheck lipase in am and monitor.  Hosie Poisson, MD [phone]

## 2016-03-08 ENCOUNTER — Inpatient Hospital Stay (HOSPITAL_COMMUNITY): Payer: MEDICAID

## 2016-03-08 DIAGNOSIS — K859 Acute pancreatitis without necrosis or infection, unspecified: Principal | ICD-10-CM

## 2016-03-08 DIAGNOSIS — K219 Gastro-esophageal reflux disease without esophagitis: Secondary | ICD-10-CM

## 2016-03-08 DIAGNOSIS — I10 Essential (primary) hypertension: Secondary | ICD-10-CM

## 2016-03-08 MED ORDER — HYDROMORPHONE HCL 1 MG/ML IJ SOLN
1.0000 mg | INTRAMUSCULAR | Status: DC | PRN
Start: 2016-03-08 — End: 2016-03-11
  Administered 2016-03-08: 1 mg via INTRAVENOUS
  Administered 2016-03-09 (×2): 2 mg via INTRAVENOUS
  Administered 2016-03-09 (×2): 1 mg via INTRAVENOUS
  Administered 2016-03-10: 2 mg via INTRAVENOUS
  Administered 2016-03-10: 1 mg via INTRAVENOUS
  Administered 2016-03-11: 2 mg via INTRAVENOUS
  Administered 2016-03-11: 1 mg via INTRAVENOUS
  Filled 2016-03-08 (×3): qty 1
  Filled 2016-03-08 (×2): qty 2
  Filled 2016-03-08 (×2): qty 1
  Filled 2016-03-08 (×2): qty 2

## 2016-03-08 MED ORDER — SODIUM CHLORIDE 0.9 % IV SOLN
INTRAVENOUS | Status: DC
  Administered 2016-03-08 – 2016-03-09 (×6): 150 mL/h via INTRAVENOUS

## 2016-03-08 MED ORDER — SODIUM CHLORIDE 0.9 % IV SOLN: INTRAVENOUS | Status: DC

## 2016-03-08 NOTE — Progress Notes (Signed)
PROGRESS NOTE    Brent Rogers  [id number] DOB: 1966-04-02 DOA: 03/06/2016 PCP: Minerva Ends, MD    Brief Narrative: Brent Rogers is a 50 y.o. male with medical history significant of pancreatic cancer s/p Whipple in 2015 and HTN; who presents with complaints of epigastric pain, he was found to have acute pancreatitis , possibly from alcohol intake.   Assessment & Plan:   Principal Problem:   Pancreatitis Active Problems:   Smoking   HTN (hypertension)   GERD (gastroesophageal reflux disease)   Intractable nausea and vomiting   Acute pancreatitis probably from alcohol use. CT abd and pelvis reviewed showed Interval development of mild proximal pancreatic edema with associated mesenteric stranding.  Interval development of 6-7 cm long segment of small bowel wall thickening immediately adjacent to the anastomotic enterotomy line in the upper mid abdomen.  Interval development of pneumobilia. Interval development of subtle left hepatic lobe subcapsular areas of hypoattenuation, which given their proximity to the surgical site and their appearance, may also represent streaks of subcapsular edema.   He was started on IV fluids, pain control and made NPO, as his pain is improving, he was started on clears.    Bilateral foot ulcers: X rays ont he left show some bony erosion, follow up with MRI of the left foot.    Hypertension: controlled.   GERD: Resume home meds.   Nausea and vomiting: improved.    DVT prophylaxis: (Lovenox) Code Status: full  Family Communication: he is alert and oriented, no family at bedside Disposition Plan: pending PT eval.   Consultants:   none   Procedures: MRI OF theleft foot.    Antimicrobials: (none   Subjective: Reports abd pain is better.   Objective: Filed Vitals:   03/07/16 1514 03/07/16 2115 03/08/16 0316 03/08/16 1414  BP: 132/88 134/90 150/98 140/96  Pulse: 82 63 71 63  Temp: 97.1 F (36.2 C) 97.8 F  (36.6 C) 98.3 F (36.8 C) 97.7 F (36.5 C)  TempSrc: Oral Oral Oral Oral  Resp: 18 18 18 18   Height:      Weight:      SpO2:  99% 100% 100%    Intake/Output Summary (Last 24 hours) at 03/08/16 1644 Last data filed at 03/08/16 1500  Gross per 24 hour  Intake   1335 ml  Output    925 ml  Net    410 ml   Filed Weights   03/06/16 2145  Weight: 68.493 kg (151 lb)    Examination:  General exam: Appears calm and comfortable  Respiratory system: Clear to auscultation. Respiratory effort normal. Cardiovascular system: S1 & S2 heard, RRR. No JVD, murmurs, rubs, gallops or clicks. No pedal edema. Gastrointestinal system: Abdomen is scaphoid, tender , bowel sounds normal.  Central nervous system: Alert and oriented. No focal neurological deficits. Extremities: Symmetric 5 x 5 power. Skin: 4 circular leg ulcers at the bottom of foot, 2 ont he left and 2 on the right. Tender with some oozing.  Psychiatry: Judgement and insight appear normal. Mood & affect appropriate.     Data Reviewed: I have personally reviewed following labs and imaging studies  CBC:  Recent Labs Lab 03/06/16 2145 03/07/16 0503  WBC 13.6* 15.2*  HGB 14.5 13.1  HCT 43.7 41.3  MCV 85.5 86.8  PLT 198 0000000   Basic Metabolic Panel:  Recent Labs Lab 03/06/16 2145 03/07/16 0503  NA 135 138  K 3.5 4.1  CL 102 103  CO2 21* 26  GLUCOSE 153* 143*  BUN <5* <5*  CREATININE 0.83 0.77  CALCIUM 9.5 8.6*   GFR: Estimated Creatinine Clearance: 107 mL/min (by C-G formula based on Cr of 0.77). Liver Function Tests:  Recent Labs Lab 03/06/16 2145  AST 34  ALT 41  ALKPHOS 113  BILITOT 1.0  PROT 6.8  ALBUMIN 4.1    Recent Labs Lab 03/06/16 2145  LIPASE 94*   No results for input(s): AMMONIA in the last 168 hours. Coagulation Profile: No results for input(s): INR, PROTIME in the last 168 hours. Cardiac Enzymes: No results for input(s): CKTOTAL, CKMB, CKMBINDEX, TROPONINI in the last 168  hours. BNP (last 3 results) No results for input(s): PROBNP in the last 8760 hours. HbA1C: No results for input(s): HGBA1C in the last 72 hours. CBG: No results for input(s): GLUCAP in the last 168 hours. Lipid Profile: No results for input(s): CHOL, HDL, LDLCALC, TRIG, CHOLHDL, LDLDIRECT in the last 72 hours. Thyroid Function Tests: No results for input(s): TSH, T4TOTAL, FREET4, T3FREE, THYROIDAB in the last 72 hours. Anemia Panel: No results for input(s): VITAMINB12, FOLATE, FERRITIN, TIBC, IRON, RETICCTPCT in the last 72 hours. Sepsis Labs: No results for input(s): PROCALCITON, LATICACIDVEN in the last 168 hours.  No results found for this or any previous visit (from the past 240 hour(s)).       Radiology Studies: Dg Chest 2 View  03/06/2016  CLINICAL DATA:  51 year old male with chest pain EXAM: CHEST  2 VIEW COMPARISON:  Chest CT dated 11/12/2015 FINDINGS: The heart size and mediastinal contours are within normal limits. Both lungs are clear. The visualized skeletal structures are unremarkable. IMPRESSION: No active cardiopulmonary disease. Electronically Signed   By: Anner Crete M.D.   On: 03/06/2016 22:28   Ct Abdomen Pelvis W Contrast  03/07/2016  CLINICAL DATA:  Pancreatitis.  History of Whipple's procedure. EXAM: CT ABDOMEN AND PELVIS WITH CONTRAST TECHNIQUE: Multidetector CT imaging of the abdomen and pelvis was performed using the standard protocol following bolus administration of intravenous contrast. CONTRAST:  150mL ISOVUE-300 IOPAMIDOL (ISOVUE-300) INJECTION 61% COMPARISON:  CT of the abdomen pelvis 06/23/2015 FINDINGS: Lower chest:  Mild dependent atelectasis. Hepatobiliary: There are subtle geographic areas of hypoattenuation in the subcapsular portion of the left lobe of the liver. There is left hepatic lobe pneumobilia. Pancreas: Status post Whipple's procedure. Mild edema is seen within the proximal pancreas, without evidence of pseudocyst formation. There is mild  peripancreatic fat stranding. Spleen: Within normal limits in size and appearance. Adrenals/Urinary Tract: No masses identified. No evidence of hydronephrosis. Bilateral renal cysts. Stomach/Bowel: No evidence of obstruction. There is approximately 6-7 cm long segment of small bowel wall thickening with associated mesenteric edema, immediately adjacent to the anastomotic enterotomy line in the upper mid abdomen. This finding is new from the prior CT. Vascular/Lymphatic: No pathologically enlarged lymph nodes. No evidence of abdominal aortic aneurysm. Reproductive: Somewhat heterogeneous appearance of the prostate gland. Other: None. Musculoskeletal:  No suspicious bone lesions identified. IMPRESSION: Status post Whipple's procedure. Interval development of mild proximal pancreatic edema with associated mesenteric stranding. Interval development of 6-7 cm long segment of small bowel wall thickening immediately adjacent to the anastomotic enterotomy line in the upper mid abdomen. Interval development of pneumobilia. Interval development of subtle left hepatic lobe subcapsular areas of hypoattenuation, which given their proximity to the surgical site and their appearance, may also represent streaks of subcapsular edema. Heterogeneous appearance of the prostate gland. Please correlate to serum PSA levels. These results will be called to  the ordering clinician or representative by the Radiologist Assistant, and communication documented in the PACS or zVision Dashboard. Electronically Signed   By: Fidela Salisbury M.D.   On: 03/07/2016 07:16   Dg Abd 2 Views  03/07/2016  CLINICAL DATA:  Abdominal pain with nausea and vomiting EXAM: ABDOMEN - 2 VIEW COMPARISON:  06/23/2015 CT FINDINGS: Postsurgical changes in the left upper quadrant. Patient is status post Whipple procedure. Normal bowel gas pattern. No concerning intra-abdominal mass effect or calcification. Lung bases are clear. No pneumoperitoneum. IMPRESSION:  Normal bowel gas pattern. Electronically Signed   By: Monte Fantasia M.D.   On: 03/07/2016 03:49   Dg Foot Complete Left  03/07/2016  CLINICAL DATA:  Left foot ulcer EXAM: LEFT FOOT - COMPLETE 3+ VIEW COMPARISON:  None. FINDINGS: Focal soft tissue defect is noted in the region of the fifth metatarsal head. Questionable bony erosion in medial aspect of the fifth metatarsal head is noted. No acute fracture or dislocation is noted. IMPRESSION: Soft tissue defect with suggestion of bony erosion. MRI would be more sensitive for diagnosis of osteomyelitis. Electronically Signed   By: Inez Catalina M.D.   On: 03/07/2016 17:10   Dg Foot Complete Right  03/07/2016  CLINICAL DATA:  Foot ulcer EXAM: RIGHT FOOT COMPLETE - 3+ VIEW COMPARISON:  None. FINDINGS: No acute fracture or dislocation is noted. No bony erosion is seen. Small soft tissue defect is noted inferiorly near the metatarsal heads. IMPRESSION: Soft tissue wound without acute bony abnormality. Electronically Signed   By: Inez Catalina M.D.   On: 03/07/2016 17:11        Scheduled Meds: . amLODipine  5 mg Oral Daily  . enoxaparin (LOVENOX) injection  40 mg Subcutaneous Q24H  . lipase/protease/amylase  24,000 Units Oral QHS  . nicotine  21 mg Transdermal Daily  . pantoprazole  40 mg Oral Daily   Continuous Infusions: . sodium chloride 150 mL/hr (03/08/16 1400)     LOS: 1 day    Time spent:25 minutes.     Hosie Poisson, MD [facility] Pager [phone]  If 7PM-7AM, please contact night-coverage [email or web address] Password [email or web address] 03/08/2016, 4:44 PM

## 2016-03-09 DIAGNOSIS — R1110 Vomiting, unspecified: Secondary | ICD-10-CM

## 2016-03-09 LAB — BASIC METABOLIC PANEL
Anion gap: 5 (ref 5–15)
BUN: <5 mg/dL — ABNORMAL LOW (ref 6–20)
CO2: 27 mmol/L (ref 22–32)
Calcium: 8.4 mg/dL — ABNORMAL LOW (ref 8.9–10.3)
Chloride: 103 mmol/L (ref 101–111)
Creatinine, Ser: 0.6 mg/dL — ABNORMAL LOW (ref 0.61–1.24)
GFR calc Af Amer: >60 mL/min (ref >60)
GFR calc non Af Amer: >60 mL/min (ref >60)
Glucose, Bld: 130 mg/dL — ABNORMAL HIGH (ref 65–99)
Potassium: 2.9 mmol/L — ABNORMAL LOW (ref 3.5–5.1)
Sodium: 135 mmol/L (ref 135–145)

## 2016-03-09 LAB — CBC
HCT: 38.5 % — ABNORMAL LOW (ref 39.0–52.0)
Hemoglobin: 12.1 g/dL — ABNORMAL LOW (ref 13.0–17.0)
MCH: 27.3 pg (ref 26.0–34.0)
MCHC: 31.4 g/dL (ref 30.0–36.0)
MCV: 86.7 fL (ref 78.0–100.0)
Platelets: 131 10*3/uL — ABNORMAL LOW (ref 150–400)
RBC: 4.44 MIL/uL (ref 4.22–5.81)
RDW: 13.5 % (ref 11.5–15.5)
WBC: 6.6 10*3/uL (ref 4.0–10.5)

## 2016-03-09 LAB — LIPASE, BLOOD: Lipase: 15 U/L (ref 11–51)

## 2016-03-09 LAB — MAGNESIUM: Magnesium: 1.4 mg/dL — ABNORMAL LOW (ref 1.7–2.4)

## 2016-03-09 MED ORDER — POTASSIUM CHLORIDE CRYS ER 20 MEQ PO TBCR
40.0000 meq | EXTENDED_RELEASE_TABLET | Freq: Once | ORAL | Status: AC
  Administered 2016-03-09: 40 meq via ORAL
  Filled 2016-03-09: qty 2

## 2016-03-09 MED ORDER — POTASSIUM CHLORIDE 10 MEQ/100ML IV SOLN
10.0000 meq | INTRAVENOUS | Status: AC
  Administered 2016-03-09 (×4): 10 meq via INTRAVENOUS
  Filled 2016-03-09 (×5): qty 100

## 2016-03-09 NOTE — Progress Notes (Signed)
PROGRESS NOTE    Brent Rogers  [id number] DOB: 07-16-1966 DOA: 03/06/2016 PCP: Minerva Ends, MD    Brief Narrative: Brent Rogers is a 50 y.o. male with medical history significant of pancreatic cancer s/p Whipple in 2015 and HTN; who presents with complaints of epigastric pain, he was found to have acute pancreatitis , possibly from alcohol intake.   Assessment & Plan:   Principal Problem:   Pancreatitis Active Problems:   Smoking   HTN (hypertension)   GERD (gastroesophageal reflux disease)   Intractable nausea and vomiting   Acute pancreatitis probably from alcohol use. CT abd and pelvis reviewed showed Interval development of mild proximal pancreatic edema with associated mesenteric stranding.  Interval development of 6-7 cm long segment of small bowel wall thickening immediately adjacent to the anastomotic enterotomy line in the upper mid abdomen.  Interval development of pneumobilia. Interval development of subtle left hepatic lobe subcapsular areas of hypoattenuation, which given their proximity to the surgical site and their appearance, may also represent streaks of subcapsular edema.   He was started on IV fluids, pain control and made NPO, as his pain is improving, he was started on clears.  Creon added on.   Bilateral foot ulcers: X rays ont he left show some bony erosion, follow up with MRI of the left foot.  MRI of teh foot shows soft tissue defects but no osteomyelitis. No current active inflammation.  Mild flexor hallucis longus tenosynovitis.    Hypertension: controlled.   GERD: Resume home meds.   Nausea and vomiting: improved.    DVT prophylaxis: (Lovenox) Code Status: full  Family Communication: he is alert and oriented, no family at bedside Disposition Plan: pending PT eval.   Consultants:   none   Procedures: MRI OF theleft foot.    Antimicrobials: (none   Subjective: Reports abd pain is better.    Objective: Filed Vitals:   03/08/16 1414 03/08/16 2149 03/09/16 0535 03/09/16 1355  BP: 140/96 139/99 142/93 127/92  Pulse: 63 76 58 66  Temp: 97.7 F (36.5 C) 99.4 F (37.4 C) 98.3 F (36.8 C) 98.3 F (36.8 C)  TempSrc: Oral Oral Oral   Resp: 18 18 18 18   Height:      Weight:      SpO2: 100% 99% 100% 100%    Intake/Output Summary (Last 24 hours) at 03/09/16 1438 Last data filed at 03/09/16 1343  Gross per 24 hour  Intake   3790 ml  Output   2750 ml  Net   1040 ml   Filed Weights   03/06/16 2145  Weight: 68.493 kg (151 lb)    Examination:  General exam: Appears calm and comfortable  Respiratory system: Clear to auscultation. Respiratory effort normal. Cardiovascular system: S1 & S2 heard, RRR. No JVD, murmurs, rubs, gallops or clicks. No pedal edema. Gastrointestinal system: Abdomen is scaphoid, tender , bowel sounds normal.  Central nervous system: Alert and oriented. No focal neurological deficits. Extremities: Symmetric 5 x 5 power. Skin: 4 circular leg ulcers at the bottom of foot, 2 ont he left and 2 on the right. Tender with some oozing.  Psychiatry: Judgement and insight appear normal. Mood & affect appropriate.     Data Reviewed: I have personally reviewed following labs and imaging studies  CBC:  Recent Labs Lab 03/06/16 2145 03/07/16 0503 03/09/16 1129  WBC 13.6* 15.2* 6.6  HGB 14.5 13.1 12.1*  HCT 43.7 41.3 38.5*  MCV 85.5 86.8 86.7  PLT 198 175  [id number]*   Basic Metabolic Panel:  Recent Labs Lab 03/06/16 2145 03/07/16 0503 03/09/16 1129  NA 135 138 135  K 3.5 4.1 2.9*  CL 102 103 103  CO2 21* 26 27  GLUCOSE 153* 143* 130*  BUN <5* <5* <5*  CREATININE 0.83 0.77 0.60*  CALCIUM 9.5 8.6* 8.4*   GFR: Estimated Creatinine Clearance: 107 mL/min (by C-G formula based on Cr of 0.6). Liver Function Tests:  Recent Labs Lab 03/06/16 2145  AST 34  ALT 41  ALKPHOS 113  BILITOT 1.0  PROT 6.8  ALBUMIN 4.1    Recent Labs Lab  03/06/16 2145 03/09/16 1129  LIPASE 94* 15   No results for input(s): AMMONIA in the last 168 hours. Coagulation Profile: No results for input(s): INR, PROTIME in the last 168 hours. Cardiac Enzymes: No results for input(s): CKTOTAL, CKMB, CKMBINDEX, TROPONINI in the last 168 hours. BNP (last 3 results) No results for input(s): PROBNP in the last 8760 hours. HbA1C: No results for input(s): HGBA1C in the last 72 hours. CBG: No results for input(s): GLUCAP in the last 168 hours. Lipid Profile: No results for input(s): CHOL, HDL, LDLCALC, TRIG, CHOLHDL, LDLDIRECT in the last 72 hours. Thyroid Function Tests: No results for input(s): TSH, T4TOTAL, FREET4, T3FREE, THYROIDAB in the last 72 hours. Anemia Panel: No results for input(s): VITAMINB12, FOLATE, FERRITIN, TIBC, IRON, RETICCTPCT in the last 72 hours. Sepsis Labs: No results for input(s): PROCALCITON, LATICACIDVEN in the last 168 hours.  No results found for this or any previous visit (from the past 240 hour(s)).       Radiology Studies: Mr Foot Left Wo Contrast  03/09/2016  CLINICAL DATA:  Callus tear is along the foot, marked with vitamin-E capsule during imaging. Radiography raise the possibility of bony erosion along the fifth metatarsal head. EXAM: MRI OF THE LEFT FOREFOOT WITHOUT CONTRAST TECHNIQUE: Multiplanar, multisequence MR imaging was performed. No intravenous contrast was administered. COMPARISON:  03/07/2016 radiographs FINDINGS: There is a focal soft tissue defect plantar to the fifth metatarsal head, causing a ring cul like indentation of about 6 mm, with loss of subcutaneous tissues in this vicinity as shown on image 27 series 6. I do not see surrounding inflammatory edema in the soft tissues, although there may be some adjacent scarring based on the T1 weighted images, along with skin thickening. This could represent a region of ulceration which has since healed or re- epithelialized, leaving a soft tissue defect  but without a great deal of current active inflammation. I do not see an abscess. There is no adjacent osteomyelitis in the appearance of erosion of the fifth metatarsal head is attributed to the lucency cause by the soft tissue defect rather than an actual erosion. The second area which is similar but less striking is noted plantar to the medial sesamoid of the first digit, were there is a small skin indentation, some thinning of the subcutaneous tissues, and possibly some regional scarring, but no abnormal inflammatory edema to suggest an active ulceration or necrotic tissue. No abnormal marrow edema in the adjacent first digit sesamoids or elsewhere in the forefoot. There is mild flexor hallucis longus tenosynovitis. Low-level nonspecific edema in the abductor hallucis muscle, images 26 through 34 series 6. Lisfranc ligament intact, image 12/7. IMPRESSION: 1. Focal soft tissue defects plantar to the fifth and first metatarsal heads but without current active inflammation, causing skin indentations which may be re- epithelialized, suspicious for sites of prior ulceration which have healed. 2. No active  osteomyelitis identified. 3. Mild flexor hallucis longus tenosynovitis. 4. There is low-level edema in the abductor hallucis muscle, uncertain significance. Electronically Signed   By: Van Clines M.D.   On: 03/09/2016 12:03   Dg Foot Complete Left  03/07/2016  CLINICAL DATA:  Left foot ulcer EXAM: LEFT FOOT - COMPLETE 3+ VIEW COMPARISON:  None. FINDINGS: Focal soft tissue defect is noted in the region of the fifth metatarsal head. Questionable bony erosion in medial aspect of the fifth metatarsal head is noted. No acute fracture or dislocation is noted. IMPRESSION: Soft tissue defect with suggestion of bony erosion. MRI would be more sensitive for diagnosis of osteomyelitis. Electronically Signed   By: Inez Catalina M.D.   On: 03/07/2016 17:10   Dg Foot Complete Right  03/07/2016  CLINICAL DATA:  Foot  ulcer EXAM: RIGHT FOOT COMPLETE - 3+ VIEW COMPARISON:  None. FINDINGS: No acute fracture or dislocation is noted. No bony erosion is seen. Small soft tissue defect is noted inferiorly near the metatarsal heads. IMPRESSION: Soft tissue wound without acute bony abnormality. Electronically Signed   By: Inez Catalina M.D.   On: 03/07/2016 17:11        Scheduled Meds: . amLODipine  5 mg Oral Daily  . enoxaparin (LOVENOX) injection  40 mg Subcutaneous Q24H  . lipase/protease/amylase  24,000 Units Oral QHS  . nicotine  21 mg Transdermal Daily  . pantoprazole  40 mg Oral Daily  . potassium chloride  10 mEq Intravenous Q1 Hr x 4  . potassium chloride  40 mEq Oral Once   Continuous Infusions: . sodium chloride 150 mL/hr (03/09/16 0806)     LOS: 2 days    Time spent:25 minutes.     Hosie Poisson, MD [facility] Pager [phone]  If 7PM-7AM, please contact night-coverage [email or web address] Password [facility] 03/09/2016, 2:38 PM

## 2016-03-10 LAB — BASIC METABOLIC PANEL
Anion gap: 5 (ref 5–15)
BUN: <5 mg/dL — ABNORMAL LOW (ref 6–20)
CO2: 27 mmol/L (ref 22–32)
Calcium: 8.9 mg/dL (ref 8.9–10.3)
Chloride: 103 mmol/L (ref 101–111)
Creatinine, Ser: 0.65 mg/dL (ref 0.61–1.24)
GFR calc Af Amer: >60 mL/min (ref >60)
GFR calc non Af Amer: >60 mL/min (ref >60)
Glucose, Bld: 100 mg/dL — ABNORMAL HIGH (ref 65–99)
Potassium: 3.3 mmol/L — ABNORMAL LOW (ref 3.5–5.1)
Sodium: 135 mmol/L (ref 135–145)

## 2016-03-10 MED ORDER — MAGNESIUM SULFATE 50 % IJ SOLN
3.0000 g | Freq: Once | INTRAVENOUS | Status: AC
  Administered 2016-03-10: 3 g via INTRAVENOUS
  Filled 2016-03-10: qty 6

## 2016-03-10 MED ORDER — PROMETHAZINE HCL 25 MG PO TABS
25.0000 mg | ORAL_TABLET | Freq: Three times a day (TID) | ORAL | Status: DC | PRN
  Administered 2016-03-10: 25 mg via ORAL
  Filled 2016-03-10: qty 1

## 2016-03-10 MED ORDER — POTASSIUM CHLORIDE CRYS ER 20 MEQ PO TBCR
40.0000 meq | EXTENDED_RELEASE_TABLET | Freq: Two times a day (BID) | ORAL | Status: AC
  Administered 2016-03-10 (×2): 40 meq via ORAL
  Filled 2016-03-10 (×2): qty 2

## 2016-03-10 NOTE — Progress Notes (Signed)
PROGRESS NOTE    Brent Rogers  [id number] DOB: 03/16/1966 DOA: 03/06/2016 PCP: Minerva Ends, MD    Brief Narrative: Brent Rogers is a 50 y.o. male with medical history significant of pancreatic cancer s/p Whipple in 2015 and HTN; who presents with complaints of epigastric pain, he was found to have acute pancreatitis , possibly from alcohol intake.   Assessment & Plan:   Principal Problem:   Pancreatitis Active Problems:   Smoking   HTN (hypertension)   GERD (gastroesophageal reflux disease)   Intractable nausea and vomiting   Acute pancreatitis probably from alcohol use. CT abd and pelvis reviewed showed Interval development of mild proximal pancreatic edema with associated mesenteric stranding.  Interval development of 6-7 cm long segment of small bowel wall thickening immediately adjacent to the anastomotic enterotomy line in the upper mid abdomen.  Interval development of pneumobilia. Interval development of subtle left hepatic lobe subcapsular areas of hypoattenuation, which given their proximity to the surgical site and their appearance, may also represent streaks of subcapsular edema.   He was started on IV fluids, pain control and made NPO, as his pain is improving, he was started on clears.  Creon added on. Advanced to soft diet today.  Would continue the IV fluids and watchthe patient on soft diet tonight before we discharge him in am.    Bilateral foot ulcers: X rays ont he left show some bony erosion, follow up with MRI of the left foot.  MRI of the foot shows soft tissue defects but no osteomyelitis. No current active inflammation.  Mild flexor hallucis longus tenosynovitis.    Hypertension: controlled.   GERD: Resume home meds.   Nausea and vomiting: improved. Resume home phenergan.   DVT prophylaxis: (Lovenox) Code Status: full  Family Communication: he is alert and oriented, no family at bedside Disposition Plan: home tomorrow if  no pain .   Consultants:   none   Procedures: MRI OF theleft foot.    Antimicrobials: (none   Subjective: Reports abd pain is better.   Objective: Filed Vitals:   03/09/16 1940 03/10/16 0558 03/10/16 0835 03/10/16 1500  BP: 140/93 124/83 131/93 120/88  Pulse: 57 61 57 62  Temp: 98.1 F (36.7 C) 98.1 F (36.7 C) 98 F (36.7 C) 97.9 F (36.6 C)  TempSrc: Oral Oral Oral Oral  Resp: 18 18 18 18   Height:      Weight:      SpO2: 100% 100% 100% 100%    Intake/Output Summary (Last 24 hours) at 03/10/16 1638 Last data filed at 03/10/16 1500  Gross per 24 hour  Intake    360 ml  Output   1575 ml  Net  -1215 ml   Filed Weights   03/06/16 2145  Weight: 68.493 kg (151 lb)    Examination:  General exam: Appears calm and comfortable  Respiratory system: Clear to auscultation. Respiratory effort normal. Cardiovascular system: S1 & S2 heard, RRR. No JVD, murmurs, rubs, gallops or clicks. No pedal edema. Gastrointestinal system: Abdomen is scaphoid, tender , bowel sounds normal.  Central nervous system: Alert and oriented. No focal neurological deficits. Extremities: Symmetric 5 x 5 power. Skin: 4 circular leg ulcers at the bottom of foot, 2 ont he left and 2 on the right. Tender with some oozing.  Psychiatry: Judgement and insight appear normal. Mood & affect appropriate.     Data Reviewed: I have personally reviewed following labs and imaging studies  CBC:  Recent Labs Lab  03/06/16 2145 03/07/16 0503 03/09/16 1129  WBC 13.6* 15.2* 6.6  HGB 14.5 13.1 12.1*  HCT 43.7 41.3 38.5*  MCV 85.5 86.8 86.7  PLT 198 175 [id number]*   Basic Metabolic Panel:  Recent Labs Lab 03/06/16 2145 03/07/16 0503 03/09/16 1129 03/09/16 1613 03/10/16 1017  NA 135 138 135  --  135  K 3.5 4.1 2.9*  --  3.3*  CL 102 103 103  --  103  CO2 21* 26 27  --  27  GLUCOSE 153* 143* 130*  --  100*  BUN <5* <5* <5*  --  <5*  CREATININE 0.83 0.77 0.60*  --  0.65  CALCIUM 9.5 8.6* 8.4*  --   8.9  MG  --   --   --  1.4*  --    GFR: Estimated Creatinine Clearance: 107 mL/min (by C-G formula based on Cr of 0.65). Liver Function Tests:  Recent Labs Lab 03/06/16 2145  AST 34  ALT 41  ALKPHOS 113  BILITOT 1.0  PROT 6.8  ALBUMIN 4.1    Recent Labs Lab 03/06/16 2145 03/09/16 1129  LIPASE 94* 15   No results for input(s): AMMONIA in the last 168 hours. Coagulation Profile: No results for input(s): INR, PROTIME in the last 168 hours. Cardiac Enzymes: No results for input(s): CKTOTAL, CKMB, CKMBINDEX, TROPONINI in the last 168 hours. BNP (last 3 results) No results for input(s): PROBNP in the last 8760 hours. HbA1C: No results for input(s): HGBA1C in the last 72 hours. CBG: No results for input(s): GLUCAP in the last 168 hours. Lipid Profile: No results for input(s): CHOL, HDL, LDLCALC, TRIG, CHOLHDL, LDLDIRECT in the last 72 hours. Thyroid Function Tests: No results for input(s): TSH, T4TOTAL, FREET4, T3FREE, THYROIDAB in the last 72 hours. Anemia Panel: No results for input(s): VITAMINB12, FOLATE, FERRITIN, TIBC, IRON, RETICCTPCT in the last 72 hours. Sepsis Labs: No results for input(s): PROCALCITON, LATICACIDVEN in the last 168 hours.  No results found for this or any previous visit (from the past 240 hour(s)).       Radiology Studies: Mr Foot Left Wo Contrast  03/09/2016  CLINICAL DATA:  Callus tear is along the foot, marked with vitamin-E capsule during imaging. Radiography raise the possibility of bony erosion along the fifth metatarsal head. EXAM: MRI OF THE LEFT FOREFOOT WITHOUT CONTRAST TECHNIQUE: Multiplanar, multisequence MR imaging was performed. No intravenous contrast was administered. COMPARISON:  03/07/2016 radiographs FINDINGS: There is a focal soft tissue defect plantar to the fifth metatarsal head, causing a ring cul like indentation of about 6 mm, with loss of subcutaneous tissues in this vicinity as shown on image 27 series 6. I do not see  surrounding inflammatory edema in the soft tissues, although there may be some adjacent scarring based on the T1 weighted images, along with skin thickening. This could represent a region of ulceration which has since healed or re- epithelialized, leaving a soft tissue defect but without a great deal of current active inflammation. I do not see an abscess. There is no adjacent osteomyelitis in the appearance of erosion of the fifth metatarsal head is attributed to the lucency cause by the soft tissue defect rather than an actual erosion. The second area which is similar but less striking is noted plantar to the medial sesamoid of the first digit, were there is a small skin indentation, some thinning of the subcutaneous tissues, and possibly some regional scarring, but no abnormal inflammatory edema to suggest an active ulceration or necrotic  tissue. No abnormal marrow edema in the adjacent first digit sesamoids or elsewhere in the forefoot. There is mild flexor hallucis longus tenosynovitis. Low-level nonspecific edema in the abductor hallucis muscle, images 26 through 34 series 6. Lisfranc ligament intact, image 12/7. IMPRESSION: 1. Focal soft tissue defects plantar to the fifth and first metatarsal heads but without current active inflammation, causing skin indentations which may be re- epithelialized, suspicious for sites of prior ulceration which have healed. 2. No active osteomyelitis identified. 3. Mild flexor hallucis longus tenosynovitis. 4. There is low-level edema in the abductor hallucis muscle, uncertain significance. Electronically Signed   By: Van Clines M.D.   On: 03/09/2016 12:03        Scheduled Meds: . amLODipine  5 mg Oral Daily  . enoxaparin (LOVENOX) injection  40 mg Subcutaneous Q24H  . lipase/protease/amylase  24,000 Units Oral QHS  . nicotine  21 mg Transdermal Daily  . pantoprazole  40 mg Oral Daily  . potassium chloride  40 mEq Oral BID   Continuous Infusions: .  sodium chloride 150 mL/hr (03/09/16 2041)     LOS: 3 days    Time spent:25 minutes.     Hosie Poisson, MD [facility] Pager [phone]  If 7PM-7AM, please contact night-coverage [email or web address] Password [facility] 03/10/2016, 4:38 PM

## 2016-03-10 NOTE — Progress Notes (Signed)
Pt informed RN he didn't have any pain or discomfort and requested for real food to eat. Dr Karleen Hampshire notified and new order received for soft diet. Pt informed and will continue to monitor closely. Delia Heady RN

## 2016-03-11 LAB — BASIC METABOLIC PANEL
Anion gap: 5 (ref 5–15)
BUN: <5 mg/dL — ABNORMAL LOW (ref 6–20)
CO2: 27 mmol/L (ref 22–32)
Calcium: 8.9 mg/dL (ref 8.9–10.3)
Chloride: 105 mmol/L (ref 101–111)
Creatinine, Ser: 0.7 mg/dL (ref 0.61–1.24)
GFR calc Af Amer: >60 mL/min (ref >60)
GFR calc non Af Amer: >60 mL/min (ref >60)
Glucose, Bld: 106 mg/dL — ABNORMAL HIGH (ref 65–99)
Potassium: 3.8 mmol/L (ref 3.5–5.1)
Sodium: 137 mmol/L (ref 135–145)

## 2016-03-11 LAB — MAGNESIUM: Magnesium: 1.8 mg/dL (ref 1.7–2.4)

## 2016-03-11 MED ORDER — BISMUTH SUBSALICYLATE 262 MG/15ML PO SUSP
30.0000 mL | Freq: Three times a day (TID) | ORAL | Status: DC
  Filled 2016-03-11: qty 118

## 2016-03-11 MED ORDER — OXYCODONE-ACETAMINOPHEN 5-325 MG PO TABS: 1.0000 | ORAL_TABLET | Freq: Three times a day (TID) | ORAL | Status: DC | PRN

## 2016-03-11 MED ORDER — PRO-STAT SUGAR FREE PO LIQD: 30.0000 mL | Freq: Three times a day (TID) | ORAL | Status: DC

## 2016-03-11 MED ORDER — PANTOPRAZOLE SODIUM 40 MG PO TBEC: 40.0000 mg | DELAYED_RELEASE_TABLET | Freq: Every day | ORAL | Status: DC

## 2016-03-11 MED ORDER — ACETAMINOPHEN 500 MG PO TABS: 500.0000 mg | ORAL_TABLET | Freq: Four times a day (QID) | ORAL | Status: DC | PRN

## 2016-03-11 MED ORDER — PROMETHAZINE HCL 25 MG PO TABS: 25.0000 mg | ORAL_TABLET | Freq: Three times a day (TID) | ORAL | Status: DC | PRN

## 2016-03-11 NOTE — Discharge Summary (Signed)
Physician Discharge Summary  Brent Rogers [id number] DOB: 23-Dec-1965 DOA: 03/06/2016  PCP: Minerva Ends, MD  Admit date: 03/06/2016 Discharge date: 03/11/2016  Admitted From: home  Disposition:  Home   Recommendations for Outpatient Follow-up:  1. Follow up with PCP in 1-2 weeks 2. Please obtain BMP/CBC in one week     Discharge Condition:stable.  CODE STATUS:full code.  Diet recommendation: Heart Healthy   Brief/Interim Summary: Brent Rogers is a 50 y.o. male with medical history significant of pancreatic cancer s/p Whipple in 2015 and HTN; who presents with complaints of epigastric pain, he was found to have acute pancreatitis , possibly from alcohol intake.   Discharge Diagnoses:  Principal Problem:   Pancreatitis Active Problems:   Smoking   HTN (hypertension)   GERD (gastroesophageal reflux disease)   Intractable nausea and vomiting  Acute pancreatitis probably from alcohol use. CT abd and pelvis reviewed showed Interval development of mild proximal pancreatic edema with associated mesenteric stranding.  Interval development of 6-7 cm long segment of small bowel wall thickening immediately adjacent to the anastomotic enterotomy line in the upper mid abdomen.  Interval development of pneumobilia. Interval development of subtle left hepatic lobe subcapsular areas of hypoattenuation, which given their proximity to the surgical site and their appearance, may also represent streaks of subcapsular edema.   He was started on IV fluids, pain control and made NPO, as his pain is improving, he was started on clears.  Creon added on. Advanced to soft diet today.  Able to tolerate diet without nausea and vomiting and abdominal pain.    Bilateral foot ulcers: X rays ont he left show some bony erosion, follow up with MRI of the left foot.  MRI of the foot shows soft tissue defects but no osteomyelitis. No current active inflammation.  Mild flexor hallucis  longus tenosynovitis.  Outpatient follow up with podiatry.    Hypertension: controlled.   GERD: Resume home meds.   Nausea and vomiting: improved. Resume home phenergan.  Discharge Instructions  Discharge Instructions    Diet - low sodium heart healthy    Complete by:  As directed      Discharge instructions    Complete by:  As directed   Follow up with PCP in one week.            Medication List    TAKE these medications        acetaminophen 500 MG tablet  Commonly known as:  TYLENOL  Take 1 tablet (500 mg total) by mouth every 6 (six) hours as needed for moderate pain (stomach pain).     amLODipine 5 MG tablet  Commonly known as:  NORVASC  Take 1 tablet (5 mg total) by mouth daily.     bismuth subsalicylate [id number] suspension  Commonly known as:  PEPTO BISMOL  Take 30 mLs by mouth every 6 (six) hours as needed (stomach ache).     oxyCODONE-acetaminophen 5-325 MG tablet  Commonly known as:  PERCOCET/ROXICET  Take 1-2 tablets by mouth every 8 (eight) hours as needed for moderate pain or severe pain.     Pancrelipase (Lip-Prot-Amyl) 24000 units Cpep  Take 1 capsule (24,000 Units total) by mouth at bedtime.     pantoprazole 40 MG tablet  Commonly known as:  PROTONIX  Take 1 tablet (40 mg total) by mouth daily.     promethazine 25 MG tablet  Commonly known as:  PHENERGAN  Take 1 tablet (25 mg total) by mouth  every 8 (eight) hours as needed for nausea or vomiting.           Follow-up Information    Follow up with Minerva Ends, MD. Schedule an appointment as soon as possible for a visit in 1 week.   Specialty:  Family Medicine   Contact information:   [employer] [place] [address] [phone]      No Known Allergies  Consultations:  none   Procedures/Studies: Dg Chest 2 View  03/06/2016  CLINICAL DATA:  50 year old male with chest pain EXAM: CHEST  2 VIEW COMPARISON:  Chest CT dated 11/12/2015 FINDINGS: The heart size and  mediastinal contours are within normal limits. Both lungs are clear. The visualized skeletal structures are unremarkable. IMPRESSION: No active cardiopulmonary disease. Electronically Signed   By: Anner Crete M.D.   On: 03/06/2016 22:28   Ct Abdomen Pelvis W Contrast  03/07/2016  CLINICAL DATA:  Pancreatitis.  History of Whipple's procedure. EXAM: CT ABDOMEN AND PELVIS WITH CONTRAST TECHNIQUE: Multidetector CT imaging of the abdomen and pelvis was performed using the standard protocol following bolus administration of intravenous contrast. CONTRAST:  144mL ISOVUE-300 IOPAMIDOL (ISOVUE-300) INJECTION 61% COMPARISON:  CT of the abdomen pelvis 06/23/2015 FINDINGS: Lower chest:  Mild dependent atelectasis. Hepatobiliary: There are subtle geographic areas of hypoattenuation in the subcapsular portion of the left lobe of the liver. There is left hepatic lobe pneumobilia. Pancreas: Status post Whipple's procedure. Mild edema is seen within the proximal pancreas, without evidence of pseudocyst formation. There is mild peripancreatic fat stranding. Spleen: Within normal limits in size and appearance. Adrenals/Urinary Tract: No masses identified. No evidence of hydronephrosis. Bilateral renal cysts. Stomach/Bowel: No evidence of obstruction. There is approximately 6-7 cm long segment of small bowel wall thickening with associated mesenteric edema, immediately adjacent to the anastomotic enterotomy line in the upper mid abdomen. This finding is new from the prior CT. Vascular/Lymphatic: No pathologically enlarged lymph nodes. No evidence of abdominal aortic aneurysm. Reproductive: Somewhat heterogeneous appearance of the prostate gland. Other: None. Musculoskeletal:  No suspicious bone lesions identified. IMPRESSION: Status post Whipple's procedure. Interval development of mild proximal pancreatic edema with associated mesenteric stranding. Interval development of 6-7 cm long segment of small bowel wall thickening  immediately adjacent to the anastomotic enterotomy line in the upper mid abdomen. Interval development of pneumobilia. Interval development of subtle left hepatic lobe subcapsular areas of hypoattenuation, which given their proximity to the surgical site and their appearance, may also represent streaks of subcapsular edema. Heterogeneous appearance of the prostate gland. Please correlate to serum PSA levels. These results will be called to the ordering clinician or representative by the Radiologist Assistant, and communication documented in the PACS or zVision Dashboard. Electronically Signed   By: Fidela Salisbury M.D.   On: 03/07/2016 07:16   Mr Foot Left Wo Contrast  03/09/2016  CLINICAL DATA:  Callus tear is along the foot, marked with vitamin-E capsule during imaging. Radiography raise the possibility of bony erosion along the fifth metatarsal head. EXAM: MRI OF THE LEFT FOREFOOT WITHOUT CONTRAST TECHNIQUE: Multiplanar, multisequence MR imaging was performed. No intravenous contrast was administered. COMPARISON:  03/07/2016 radiographs FINDINGS: There is a focal soft tissue defect plantar to the fifth metatarsal head, causing a ring cul like indentation of about 6 mm, with loss of subcutaneous tissues in this vicinity as shown on image 27 series 6. I do not see surrounding inflammatory edema in the soft tissues, although there may be some adjacent scarring based on  the T1 weighted images, along with skin thickening. This could represent a region of ulceration which has since healed or re- epithelialized, leaving a soft tissue defect but without a great deal of current active inflammation. I do not see an abscess. There is no adjacent osteomyelitis in the appearance of erosion of the fifth metatarsal head is attributed to the lucency cause by the soft tissue defect rather than an actual erosion. The second area which is similar but less striking is noted plantar to the medial sesamoid of the first digit,  were there is a small skin indentation, some thinning of the subcutaneous tissues, and possibly some regional scarring, but no abnormal inflammatory edema to suggest an active ulceration or necrotic tissue. No abnormal marrow edema in the adjacent first digit sesamoids or elsewhere in the forefoot. There is mild flexor hallucis longus tenosynovitis. Low-level nonspecific edema in the abductor hallucis muscle, images 26 through 34 series 6. Lisfranc ligament intact, image 12/7. IMPRESSION: 1. Focal soft tissue defects plantar to the fifth and first metatarsal heads but without current active inflammation, causing skin indentations which may be re- epithelialized, suspicious for sites of prior ulceration which have healed. 2. No active osteomyelitis identified. 3. Mild flexor hallucis longus tenosynovitis. 4. There is low-level edema in the abductor hallucis muscle, uncertain significance. Electronically Signed   By: Van Clines M.D.   On: 03/09/2016 12:03   Dg Abd 2 Views  03/07/2016  CLINICAL DATA:  Abdominal pain with nausea and vomiting EXAM: ABDOMEN - 2 VIEW COMPARISON:  06/23/2015 CT FINDINGS: Postsurgical changes in the left upper quadrant. Patient is status post Whipple procedure. Normal bowel gas pattern. No concerning intra-abdominal mass effect or calcification. Lung bases are clear. No pneumoperitoneum. IMPRESSION: Normal bowel gas pattern. Electronically Signed   By: Monte Fantasia M.D.   On: 03/07/2016 03:49   Dg Foot Complete Left  03/07/2016  CLINICAL DATA:  Left foot ulcer EXAM: LEFT FOOT - COMPLETE 3+ VIEW COMPARISON:  None. FINDINGS: Focal soft tissue defect is noted in the region of the fifth metatarsal head. Questionable bony erosion in medial aspect of the fifth metatarsal head is noted. No acute fracture or dislocation is noted. IMPRESSION: Soft tissue defect with suggestion of bony erosion. MRI would be more sensitive for diagnosis of osteomyelitis. Electronically Signed   By: Inez Catalina M.D.   On: 03/07/2016 17:10   Dg Foot Complete Right  03/07/2016  CLINICAL DATA:  Foot ulcer EXAM: RIGHT FOOT COMPLETE - 3+ VIEW COMPARISON:  None. FINDINGS: No acute fracture or dislocation is noted. No bony erosion is seen. Small soft tissue defect is noted inferiorly near the metatarsal heads. IMPRESSION: Soft tissue wound without acute bony abnormality. Electronically Signed   By: Inez Catalina M.D.   On: 03/07/2016 17:11       Subjective: No abdominal pain.   Discharge Exam: Filed Vitals:   03/10/16 2136 03/11/16 0238  BP: 125/75 124/86  Pulse: 58 61  Temp: 97.8 F (36.6 C) 98.4 F (36.9 C)  Resp: 18 18   Filed Vitals:   03/10/16 0835 03/10/16 1500 03/10/16 2136 03/11/16 0238  BP: 131/93 120/88 125/75 124/86  Pulse: 57 62 58 61  Temp: 98 F (36.7 C) 97.9 F (36.6 C) 97.8 F (36.6 C) 98.4 F (36.9 C)  TempSrc: Oral Oral Oral Oral  Resp: 18 18 18 18   Height:      Weight:      SpO2: 100% 100% 100% 100%  General: Pt is alert, awake, not in acute distress Cardiovascular: RRR, S1/S2 +, no rubs, no gallops Respiratory: CTA bilaterally, no wheezing, no rhonchi Abdominal: Soft, NT, ND, bowel sounds + Extremities: no edema, no cyanosis    The results of significant diagnostics from this hospitalization (including imaging, microbiology, ancillary and laboratory) are listed below for reference.     Microbiology: No results found for this or any previous visit (from the past 240 hour(s)).   Labs: BNP (last 3 results) No results for input(s): BNP in the last 8760 hours. Basic Metabolic Panel:  Recent Labs Lab 03/06/16 2145 03/07/16 0503 03/09/16 1129 03/09/16 1613 03/10/16 1017 03/11/16 0539  NA 135 138 135  --  135 137  K 3.5 4.1 2.9*  --  3.3* 3.8  CL 102 103 103  --  103 105  CO2 21* 26 27  --  27 27  GLUCOSE 153* 143* 130*  --  100* 106*  BUN <5* <5* <5*  --  <5* <5*  CREATININE 0.83 0.77 0.60*  --  0.65 0.70  CALCIUM 9.5 8.6* 8.4*  --  8.9  8.9  MG  --   --   --  1.4*  --  1.8   Liver Function Tests:  Recent Labs Lab 03/06/16 2145  AST 34  ALT 41  ALKPHOS 113  BILITOT 1.0  PROT 6.8  ALBUMIN 4.1    Recent Labs Lab 03/06/16 2145 03/09/16 1129  LIPASE 94* 15   No results for input(s): AMMONIA in the last 168 hours. CBC:  Recent Labs Lab 03/06/16 2145 03/07/16 0503 03/09/16 1129  WBC 13.6* 15.2* 6.6  HGB 14.5 13.1 12.1*  HCT 43.7 41.3 38.5*  MCV 85.5 86.8 86.7  PLT 198 175 131*   Cardiac Enzymes: No results for input(s): CKTOTAL, CKMB, CKMBINDEX, TROPONINI in the last 168 hours. BNP: Invalid input(s): POCBNP CBG: No results for input(s): GLUCAP in the last 168 hours. D-Dimer No results for input(s): DDIMER in the last 72 hours. Hgb A1c No results for input(s): HGBA1C in the last 72 hours. Lipid Profile No results for input(s): CHOL, HDL, LDLCALC, TRIG, CHOLHDL, LDLDIRECT in the last 72 hours. Thyroid function studies No results for input(s): TSH, T4TOTAL, T3FREE, THYROIDAB in the last 72 hours.  Invalid input(s): FREET3 Anemia work up No results for input(s): VITAMINB12, FOLATE, FERRITIN, TIBC, IRON, RETICCTPCT in the last 72 hours. Urinalysis    Component Value Date/Time   COLORURINE AMBER* 03/06/2016 [place] 03/06/2016 2339   LABSPEC 1.027 03/06/2016 2339   PHURINE 7.0 03/06/2016 2339   GLUCOSEU NEGATIVE 03/06/2016 2339   HGBUR NEGATIVE 03/06/2016 2339   BILIRUBINUR SMALL* 03/06/2016 2339   KETONESUR 15* 03/06/2016 2339   PROTEINUR 30* 03/06/2016 2339   UROBILINOGEN 1.0 06/23/2015 1513   NITRITE NEGATIVE 03/06/2016 2339   LEUKOCYTESUR NEGATIVE 03/06/2016 2339   Sepsis Labs Invalid input(s): PROCALCITONIN,  WBC,  LACTICIDVEN Microbiology No results found for this or any previous visit (from the past 240 hour(s)).   Time coordinating discharge: Over 30 minutes  SIGNED:   Hosie Poisson, MD  [facility] 03/11/2016, 11:18 PM Pager [phone]  If 7PM-7AM,  please contact night-coverage [email or web address] Password [email or web address]

## 2016-03-11 NOTE — Progress Notes (Signed)
Reviewed discharge information with patient. Answered all his questions.  Brent Rogers 1:47 PM 03/11/2016

## 2016-03-11 NOTE — Progress Notes (Signed)
Nutrition Follow-up  DOCUMENTATION CODES:   Severe malnutrition in context of chronic illness  INTERVENTION: Provide 30 ml Prostat po TID, each supplement provides 100 kcal and 15 grams of protein.   Recommend continuation of nutritional supplements at home.   NUTRITION DIAGNOSIS:   Malnutrition related to chronic illness as evidenced by severe depletion of body fat, severe depletion of muscle mass; ongoing  GOAL:   Patient will meet greater than or equal to 90% of their needs; progressing  MONITOR:   PO intake, Supplement acceptance, Weight trends, Labs, I & O's  REASON FOR ASSESSMENT:   Malnutrition Screening Tool    ASSESSMENT:   50 y.o. male with medical history significant of pancreatic cancer s/p Whipple in 2015 and HTN; who presents with complaints of abdominal and chest pain. Pain is sharp, constant, and located in the left chest and epigastric region.  Meal completion has been 50-75%. Pt reports his abdominal pains have improved however does report some discomfort post prandial. RD to order Prostat to aid in protein needs. Noted pt reports to be lactose intolerant thus unable to tolerate Boost or Ensure. Recommend continuation of protein supplements at home.   Labs and medications reviewed.   Diet Order:  DIET SOFT Room service appropriate?: Yes; Fluid consistency:: Thin  Skin:  Reviewed, no issues  Last BM:  6/11  Height:   Ht Readings from Last 1 Encounters:  03/06/16 6\' 3"  (1.905 m)    Weight:   Wt Readings from Last 1 Encounters:  03/06/16 151 lb (68.493 kg)    Ideal Body Weight:  89 kg  BMI:  Body mass index is 18.87 kg/(m^2).  Estimated Nutritional Needs:   Kcal:  2000-2200  Protein:  105-120 grams  Fluid:  2-2.2 L/day  EDUCATION NEEDS:   No education needs identified at this time  Corrin Parker, MS, RD, LDN Pager # [phone] After hours/ weekend pager # [phone]

## 2016-03-12 NOTE — ED Provider Notes (Signed)
CSN: [id number]     Arrival date & time 03/06/16  2141 History   First MD Initiated Contact with Patient 03/06/16 2254     Chief Complaint  Patient presents with  . Abdominal Pain  . Chest Pain     (Consider location/radiation/quality/duration/timing/severity/associated sxs/prior Treatment) HPI Comments: Patient with a history pancreatic cancer s/p whipple procedure, alcohol abuse, HTN, GERD presents with recurrent pain in LUQ and epigastric abdomen similar to previous pancreatitis last hospitalized earlier this year. No fever. He has been unable to keep anything on his stomach for the past 3 days. No diarrhea. He denies hematemesis. He arrives via EMS. He denies recent alcohol use.   Patient is a 50 y.o. male presenting with abdominal pain and chest pain. The history is provided by the patient and the EMS personnel. No language interpreter was used.  Abdominal Pain Pain location:  Epigastric and LUQ Pain quality: aching, cramping and sharp   Pain radiates to:  Does not radiate Pain severity:  Severe Onset quality:  Gradual Duration:  1 day Associated symptoms: chest pain, nausea, shortness of breath and vomiting   Associated symptoms: no chills, no cough and no fever   Chest Pain Associated symptoms: abdominal pain, nausea, shortness of breath and vomiting   Associated symptoms: no cough and no fever     Past Medical History  Diagnosis Date  . Scoliosis   . Pancreatic abnormality     CT  shows mass  . Chronic pancreatitis ([place])     S/P Whipple  . Hypertension   . Pneumonia 11/12/2015  . Migraine     "@ least once/month" (11/12/2015)   Past Surgical History  Procedure Laterality Date  . Lumbar disc surgery  1990's  . Finger fracture surgery Left 1995    5th digit  . Eus N/A 09/21/2013    Procedure: ESOPHAGEAL ENDOSCOPIC ULTRASOUND (EUS) RADIAL;  Surgeon: Beryle Beams, MD;  Location: [facility];  Service: Endoscopy;  Laterality: N/A;  . Fine needle aspiration N/A  09/21/2013    Procedure: FINE NEEDLE ASPIRATION (FNA) LINEAR;  Surgeon: Beryle Beams, MD;  Location: [facility];  Service: Endoscopy;  Laterality: N/A;  . Eus N/A 09/30/2013    Procedure: UPPER ENDOSCOPIC ULTRASOUND (EUS) LINEAR;  Surgeon: Beryle Beams, MD;  Location: [facility];  Service: Endoscopy;  Laterality: N/A;  . Laparoscopy N/A 12/01/2013    Procedure: LAPAROSCOPY DIAGNOSTIC PANCREATICODUODENECTOMY WITH BILIARY AND PANCREATIC STENTS;  Surgeon: Stark Klein, MD;  Location: [facility];  Service: General;  Laterality: N/A;  . Whipple procedure  12/01/2013  . Fracture surgery    . Back surgery    . Knee arthroscopy Bilateral 1987-1989    right-left   Family History  Problem Relation Age of Onset  . Cancer Mother     unsure  . Hypertension Father    Social History  Substance Use Topics  . Smoking status: Current Every Day Smoker -- 0.12 packs/day for 30 years    Types: Cigarettes  . Smokeless tobacco: Former Systems developer    Types: Snuff     Comment: "used snuff 10-15 years in my 20s-30s"  . Alcohol Use: 2.4 oz/week    4 Cans of beer per week     Comment: 11/12/2015 "1 40oz beer and 1 12 oz beer/wk"    Review of Systems  Constitutional: Negative for fever and chills.  Respiratory: Positive for shortness of breath. Negative for cough.   Cardiovascular: Positive for chest pain.  Gastrointestinal: Positive for nausea,  vomiting and abdominal pain.  Genitourinary: Negative.   Musculoskeletal: Negative.   Skin: Negative.   Neurological: Negative.  Negative for light-headedness.      Allergies  Review of patient's allergies indicates no known allergies.  Home Medications   Prior to Admission medications   Medication Sig Start Date End Date Taking? Authorizing Provider  amLODipine (NORVASC) 5 MG tablet Take 1 tablet (5 mg total) by mouth daily. 12/25/15  Yes Josalyn Funches, MD  bismuth subsalicylate (PEPTO BISMOL) 262 MG/15ML suspension Take 30 mLs by mouth every 6 (six) hours as  needed (stomach ache).   Yes Historical Provider, MD  Pancrelipase, Lip-Prot-Amyl, 24000 units CPEP Take 1 capsule (24,000 Units total) by mouth at bedtime. 10/15/15  Yes Josalyn Funches, MD  acetaminophen (TYLENOL) 500 MG tablet Take 1 tablet (500 mg total) by mouth every 6 (six) hours as needed for moderate pain (stomach pain). 03/11/16   Hosie Poisson, MD  oxyCODONE-acetaminophen (PERCOCET/ROXICET) 5-325 MG tablet Take 1-2 tablets by mouth every 8 (eight) hours as needed for moderate pain or severe pain. 03/11/16   Hosie Poisson, MD  pantoprazole (PROTONIX) 40 MG tablet Take 1 tablet (40 mg total) by mouth daily. 03/11/16   Hosie Poisson, MD  promethazine (PHENERGAN) 25 MG tablet Take 1 tablet (25 mg total) by mouth every 8 (eight) hours as needed for nausea or vomiting. 03/11/16   Hosie Poisson, MD   BP 124/86 mmHg  Pulse 61  Temp(Src) 98.4 F (36.9 C) (Oral)  Resp 18  Ht 6\' 3"  (1.905 m)  Wt 68.493 kg  BMI 18.87 kg/m2  SpO2 100% Physical Exam  Constitutional: He is oriented to person, place, and time. He appears well-developed and well-nourished.  HENT:  Head: Normocephalic.  Neck: Normal range of motion. Neck supple.  Cardiovascular: Regular rhythm.  Tachycardia present.   Pulmonary/Chest: Effort normal and breath sounds normal.  Abdominal: Soft. Bowel sounds are normal. There is tenderness. There is no rebound and no guarding.  Tenderness greater in LUQ than epigastrium. Soft abdomen. BS positive.   Musculoskeletal: Normal range of motion.  Neurological: He is alert and oriented to person, place, and time.  Skin: Skin is warm and dry. No rash noted.  Psychiatric: He has a normal mood and affect.    ED Course  Procedures (including critical care time) Labs Review Labs Reviewed  LIPASE, BLOOD - Abnormal; Notable for the following:    Lipase 94 (*)    All other components within normal limits  COMPREHENSIVE METABOLIC PANEL - Abnormal; Notable for the following:    CO2 21 (*)     Glucose, Bld 153 (*)    BUN <5 (*)    All other components within normal limits  CBC - Abnormal; Notable for the following:    WBC 13.6 (*)    All other components within normal limits  URINALYSIS, ROUTINE W REFLEX MICROSCOPIC (NOT AT [facility]) - Abnormal; Notable for the following:    Color, Urine AMBER (*)    Bilirubin Urine SMALL (*)    Ketones, ur 15 (*)    Protein, ur 30 (*)    All other components within normal limits  URINE MICROSCOPIC-ADD ON - Abnormal; Notable for the following:    Squamous Epithelial / LPF 0-5 (*)    Bacteria, UA RARE (*)    All other components within normal limits  CBC - Abnormal; Notable for the following:    WBC 15.2 (*)    All other components within normal limits  BASIC  METABOLIC PANEL - Abnormal; Notable for the following:    Glucose, Bld 143 (*)    BUN <5 (*)    Calcium 8.6 (*)    All other components within normal limits  CBC - Abnormal; Notable for the following:    Hemoglobin 12.1 (*)    HCT 38.5 (*)    Platelets 131 (*)    All other components within normal limits  BASIC METABOLIC PANEL - Abnormal; Notable for the following:    Potassium 2.9 (*)    Glucose, Bld 130 (*)    BUN <5 (*)    Creatinine, Ser 0.60 (*)    Calcium 8.4 (*)    All other components within normal limits  MAGNESIUM - Abnormal; Notable for the following:    Magnesium 1.4 (*)    All other components within normal limits  BASIC METABOLIC PANEL - Abnormal; Notable for the following:    Potassium 3.3 (*)    Glucose, Bld 100 (*)    BUN <5 (*)    All other components within normal limits  BASIC METABOLIC PANEL - Abnormal; Notable for the following:    Glucose, Bld 106 (*)    BUN <5 (*)    All other components within normal limits  PREALBUMIN  LIPASE, BLOOD  MAGNESIUM  I-STAT TROPOININ, ED    Imaging Review No results found. I have personally reviewed and evaluated these images and lab results as part of my medical decision-making.   EKG  Interpretation   Date/Time:  Thursday March 06 2016 21:41:20 EDT Ventricular Rate:  94 PR Interval:  143 QRS Duration: 109 QT Interval:  368 QTC Calculation: 460 R Axis:   74 Text Interpretation:  Sinus rhythm Atrial premature complexes LAE,  consider biatrial enlargement When compared with ECG of 11/12/2015, No  significant change was found Confirmed by [facility]  MD, DAVID ([id number]) on  03/06/2016 9:53:15 PM Also confirmed by Roxanne Mins  MD, DAVID ([id number]), editor  [facility], Leda Gauze [phone])  on 03/07/2016 8:27:59 AM      MDM   Final diagnoses:  Abdominal pain  Other acute pancreatitis  Pancreatitis    Patient arrives in the ED via EMS tachycardic, hypotensive, diaphoretic. He is actively vomiting. IV fluids bolused with improvement in heart rate and blood pressure. Pain is partially better with medications. Vomiting is improved but he continues to feel nauseous. Labs pending. EKG non-ischemic.  Re-evaluation - pain continuous. He has been unable to tolerate PO fluids. Additional medications ordered. Troponin negative. Lipase mildly positive. Will continue to observe.  Re-evaluation - symptoms remain uncontrolled. Will admit for inpatient management of pancreatitis.     Charlann Lange, PA-C 03/12/16 0725  Everlene Balls, MD 03/19/16 2320

## 2016-03-14 MED FILL — PROMETHAZINE 25 MG TABLET: 25 | 5 days supply | Qty: 15 | Fill #0

## 2016-03-16 ENCOUNTER — Encounter (HOSPITAL_COMMUNITY): Payer: Self-pay | Admitting: Emergency Medicine

## 2016-03-16 ENCOUNTER — Emergency Department (HOSPITAL_COMMUNITY)
Admission: EM | Admit: 2016-03-16 | Discharge: 2016-03-16 | Disposition: A | Discharge status: Home / Self Care | Payer: Self-pay | Attending: Emergency Medicine | Admitting: Emergency Medicine

## 2016-03-16 DIAGNOSIS — M419 Scoliosis, unspecified: Secondary | ICD-10-CM | POA: Insufficient documentation

## 2016-03-16 DIAGNOSIS — I10 Essential (primary) hypertension: Secondary | ICD-10-CM | POA: Insufficient documentation

## 2016-03-16 DIAGNOSIS — F17210 Nicotine dependence, cigarettes, uncomplicated: Secondary | ICD-10-CM | POA: Insufficient documentation

## 2016-03-16 DIAGNOSIS — K859 Acute pancreatitis without necrosis or infection, unspecified: Secondary | ICD-10-CM | POA: Insufficient documentation

## 2016-03-16 DIAGNOSIS — R112 Nausea with vomiting, unspecified: Secondary | ICD-10-CM | POA: Insufficient documentation

## 2016-03-16 DIAGNOSIS — E876 Hypokalemia: Secondary | ICD-10-CM | POA: Insufficient documentation

## 2016-03-16 DIAGNOSIS — Z79899 Other long term (current) drug therapy: Secondary | ICD-10-CM | POA: Insufficient documentation

## 2016-03-16 LAB — COMPREHENSIVE METABOLIC PANEL
ALT: 14 U/L — ABNORMAL LOW (ref 17–63)
AST: 18 U/L (ref 15–41)
Albumin: 3.9 g/dL (ref 3.5–5.0)
Alkaline Phosphatase: 93 U/L (ref 38–126)
Anion gap: 9 (ref 5–15)
BUN: <5 mg/dL — ABNORMAL LOW (ref 6–20)
CO2: 25 mmol/L (ref 22–32)
Calcium: 9.2 mg/dL (ref 8.9–10.3)
Chloride: 102 mmol/L (ref 101–111)
Creatinine, Ser: 0.7 mg/dL (ref 0.61–1.24)
GFR calc Af Amer: >60 mL/min (ref >60)
GFR calc non Af Amer: >60 mL/min (ref >60)
Glucose, Bld: 133 mg/dL — ABNORMAL HIGH (ref 65–99)
Potassium: 3.1 mmol/L — ABNORMAL LOW (ref 3.5–5.1)
Sodium: 136 mmol/L (ref 135–145)
Total Bilirubin: 0.6 mg/dL (ref 0.3–1.2)
Total Protein: 7.3 g/dL (ref 6.5–8.1)

## 2016-03-16 LAB — CBC
HCT: 37 % — ABNORMAL LOW (ref 39.0–52.0)
Hemoglobin: 13.2 g/dL (ref 13.0–17.0)
MCH: 29.6 pg (ref 26.0–34.0)
MCHC: 35.7 g/dL (ref 30.0–36.0)
MCV: 83 fL (ref 78.0–100.0)
Platelets: 333 10*3/uL (ref 150–400)
RBC: 4.46 MIL/uL (ref 4.22–5.81)
RDW: 13.3 % (ref 11.5–15.5)
WBC: 8.2 10*3/uL (ref 4.0–10.5)

## 2016-03-16 LAB — URINALYSIS, ROUTINE W REFLEX MICROSCOPIC
Bilirubin Urine: NEGATIVE
Glucose, UA: NEGATIVE mg/dL
Hgb urine dipstick: NEGATIVE
Ketones, ur: NEGATIVE mg/dL
Leukocytes, UA: NEGATIVE
Nitrite: NEGATIVE
Protein, ur: NEGATIVE mg/dL
Specific Gravity, Urine: 1.017 (ref 1.005–1.030)
pH: 6 (ref 5.0–8.0)

## 2016-03-16 LAB — LIPASE, BLOOD: Lipase: 23 U/L (ref 11–51)

## 2016-03-16 MED ORDER — HYDROMORPHONE HCL 1 MG/ML IJ SOLN
1.0000 mg | Freq: Once | INTRAMUSCULAR | Status: AC
Start: 2016-03-16 — End: 2016-03-16
  Administered 2016-03-16: 1 mg via INTRAVENOUS
  Filled 2016-03-16: qty 1

## 2016-03-16 MED ORDER — OXYCODONE-ACETAMINOPHEN 5-325 MG PO TABS
2.0000 | ORAL_TABLET | Freq: Once | ORAL | Status: AC
  Administered 2016-03-16: 2 via ORAL
  Filled 2016-03-16: qty 2

## 2016-03-16 MED ORDER — ONDANSETRON HCL 4 MG/2ML IJ SOLN
4.0000 mg | Freq: Once | INTRAMUSCULAR | Status: AC
  Administered 2016-03-16: 4 mg via INTRAVENOUS
  Filled 2016-03-16: qty 2

## 2016-03-16 MED ORDER — SODIUM CHLORIDE 0.9 % IV BOLUS (SEPSIS)
1000.0000 mL | Freq: Once | INTRAVENOUS | Status: AC
  Administered 2016-03-16: 1000 mL via INTRAVENOUS

## 2016-03-16 MED ORDER — FAMOTIDINE IN NACL 20-0.9 MG/50ML-% IV SOLN
20.0000 mg | Freq: Once | INTRAVENOUS | Status: AC
  Administered 2016-03-16: 20 mg via INTRAVENOUS
  Filled 2016-03-16: qty 50

## 2016-03-16 MED ORDER — HYDROMORPHONE HCL 1 MG/ML IJ SOLN
1.0000 mg | Freq: Once | INTRAMUSCULAR | Status: AC
  Administered 2016-03-16: 1 mg via INTRAVENOUS
  Filled 2016-03-16: qty 1

## 2016-03-16 MED ORDER — KETOROLAC TROMETHAMINE 30 MG/ML IJ SOLN
30.0000 mg | Freq: Once | INTRAMUSCULAR | Status: AC
  Administered 2016-03-16: 30 mg via INTRAVENOUS
  Filled 2016-03-16: qty 1

## 2016-03-16 MED ORDER — SODIUM CHLORIDE 0.9 % IV BOLUS (SEPSIS)
1000.0000 mL | Freq: Once | INTRAVENOUS | Status: AC
Start: 2016-03-16 — End: 2016-03-16
  Administered 2016-03-16: 1000 mL via INTRAVENOUS

## 2016-03-16 NOTE — ED Provider Notes (Signed)
CSN: [id number]     Arrival date & time 03/16/16  0040 History   First MD Initiated Contact with Patient 03/16/16 0128     Chief Complaint  Patient presents with  . Abdominal Pain     (Consider location/radiation/quality/duration/timing/severity/associated sxs/prior Treatment) HPI Comments: 50 year old male with a history of chronic pancreatitis s/p whipple procedure and HTN presents to the emergency department for evaluation of epigastric and left upper quadrant abdominal pain. Patient states that symptoms began yesterday and have been worsening despite pain medication. He states that the pain radiates to his back and is similar to his past episodes of chronic pancreatitis. He does report drinking alcohol yesterday in attempt to help himself sleep. He has had nausea and vomiting associated with his symptoms the last of which was just prior to arrival. He denies any bowel changes, melena, or hematochezia. No fevers or hematemesis. Patient was most recently discharged from the hospital 4 days ago after admission for acute pancreatitis flare.  The history is provided by the patient. No language interpreter was used.    Past Medical History  Diagnosis Date  . Scoliosis   . Pancreatic abnormality     CT  shows mass  . Chronic pancreatitis ([place])     S/P Whipple  . Hypertension   . Pneumonia 11/12/2015  . Migraine     "@ least once/month" (11/12/2015)   Past Surgical History  Procedure Laterality Date  . Lumbar disc surgery  1990's  . Finger fracture surgery Left 1995    5th digit  . Eus N/A 09/21/2013    Procedure: ESOPHAGEAL ENDOSCOPIC ULTRASOUND (EUS) RADIAL;  Surgeon: Beryle Beams, MD;  Location: [facility];  Service: Endoscopy;  Laterality: N/A;  . Fine needle aspiration N/A 09/21/2013    Procedure: FINE NEEDLE ASPIRATION (FNA) LINEAR;  Surgeon: Beryle Beams, MD;  Location: [facility];  Service: Endoscopy;  Laterality: N/A;  . Eus N/A 09/30/2013    Procedure: UPPER ENDOSCOPIC  ULTRASOUND (EUS) LINEAR;  Surgeon: Beryle Beams, MD;  Location: [facility];  Service: Endoscopy;  Laterality: N/A;  . Laparoscopy N/A 12/01/2013    Procedure: LAPAROSCOPY DIAGNOSTIC PANCREATICODUODENECTOMY WITH BILIARY AND PANCREATIC STENTS;  Surgeon: Stark Klein, MD;  Location: [facility];  Service: General;  Laterality: N/A;  . Whipple procedure  12/01/2013  . Fracture surgery    . Back surgery    . Knee arthroscopy Bilateral 1987-1989    right-left   Family History  Problem Relation Age of Onset  . Cancer Mother     unsure  . Hypertension Father    Social History  Substance Use Topics  . Smoking status: Current Every Day Smoker -- 0.12 packs/day for 30 years    Types: Cigarettes  . Smokeless tobacco: Former Systems developer    Types: Snuff     Comment: "used snuff 10-15 years in my 20s-30s"  . Alcohol Use: 2.4 oz/week    4 Cans of beer per week     Comment: 11/12/2015 "1 40oz beer and 1 12 oz beer/wk"    Review of Systems  Constitutional: Negative for fever.  Gastrointestinal: Positive for nausea, vomiting and abdominal pain. Negative for blood in stool.  Ten systems reviewed and are negative for acute change, except as noted in the HPI.    Allergies  Review of patient's allergies indicates no known allergies.  Home Medications   Prior to Admission medications   Medication Sig Start Date End Date Taking? Authorizing Provider  acetaminophen (TYLENOL) 500 MG tablet  Take 1 tablet (500 mg total) by mouth every 6 (six) hours as needed for moderate pain (stomach pain). 03/11/16  Yes Hosie Poisson, MD  amLODipine (NORVASC) 5 MG tablet Take 1 tablet (5 mg total) by mouth daily. 12/25/15  Yes Josalyn Funches, MD  bismuth subsalicylate (PEPTO BISMOL) 262 MG/15ML suspension Take 30 mLs by mouth every 6 (six) hours as needed (stomach ache).   Yes Historical Provider, MD  oxyCODONE-acetaminophen (PERCOCET/ROXICET) 5-325 MG tablet Take 1-2 tablets by mouth every 8 (eight) hours as needed for moderate  pain or severe pain. 03/11/16  Yes Hosie Poisson, MD  Pancrelipase, Lip-Prot-Amyl, 24000 units CPEP Take 1 capsule (24,000 Units total) by mouth at bedtime. 10/15/15  Yes Josalyn Funches, MD  pantoprazole (PROTONIX) 40 MG tablet Take 1 tablet (40 mg total) by mouth daily. 03/11/16  Yes Hosie Poisson, MD  promethazine (PHENERGAN) 25 MG tablet Take 1 tablet (25 mg total) by mouth every 8 (eight) hours as needed for nausea or vomiting. 03/11/16  Yes Hosie Poisson, MD   BP 122/94 mmHg  Pulse 73  Temp(Src) 99 F (37.2 C) (Oral)  Resp 18  SpO2 95%   Physical Exam  Constitutional: He is oriented to person, place, and time. He appears well-developed and well-nourished. No distress.  Patient resting, in no visible or audible signs of discomfort. He is in no acute distress.  HENT:  Head: Normocephalic and atraumatic.  Eyes: Conjunctivae and EOM are normal. No scleral icterus.  Neck: Normal range of motion.  Cardiovascular: Normal rate, regular rhythm and intact distal pulses.   Pulmonary/Chest: Effort normal and breath sounds normal. No respiratory distress. He has no wheezes. He has no rales.  Respirations even and unlabored  Abdominal: Soft. He exhibits no distension. There is tenderness. There is no rebound and no guarding.  Mild epigastric and left upper quadrant tenderness. No masses. No guarding or rigidity.  Musculoskeletal: Normal range of motion.  Neurological: He is alert and oriented to person, place, and time. He exhibits normal muscle tone. Coordination normal.  Skin: Skin is warm and dry. No rash noted. He is not diaphoretic. No erythema. No pallor.  Psychiatric: He has a normal mood and affect. His behavior is normal.  Nursing note and vitals reviewed.   ED Course  Procedures (including critical care time) Labs Review Labs Reviewed  COMPREHENSIVE METABOLIC PANEL - Abnormal; Notable for the following:    Potassium 3.1 (*)    Glucose, Bld 133 (*)    BUN <5 (*)    ALT 14 (*)    All  other components within normal limits  CBC - Abnormal; Notable for the following:    HCT 37.0 (*)    All other components within normal limits  LIPASE, BLOOD  URINALYSIS, ROUTINE W REFLEX MICROSCOPIC (NOT AT [facility])    Imaging Review No results found.   I have personally reviewed and evaluated these images and lab results as part of my medical decision-making.   EKG Interpretation None      3:45 AM Patient reassessed. He continues to complain of pain rated 10/10, though he remains comfortable appearing in the bed. Vital stable. Labs noncontributory. Will order final doses of pain medication and then plan to encourage further outpatient management.  MDM   Final diagnoses:  Recurrent acute pancreatitis  Hypokalemia    50 year old male presents to the emergency department for evaluation of symptoms consistent with acute pancreatitis. He reports that he drank alcohol yesterday to try and help himself sleep. He  was discharged from the hospital 4 days ago after admission for an acute pancreatitis flare. He has no signs of acute surgical abdomen today. Patient is afebrile and hemodynamically stable. Laboratory workup is noncontributory.  Pain managed in the emergency department with opioids. Patient has also received 3 L of IV fluids to try and help relieve his pancreatitis pain. I have advised the patient that he stop drinking alcohol as this will only aggravate his symptoms. He has been instructed to follow-up with his primary care doctor on an outpatient basis for reevaluation of his pain. Return precautions given at discharge. Patient agreeable to plan with no unaddressed concerns.   Antonietta Breach, PA-C 03/16/16 [id number]  Veryl Speak, MD 03/16/16 [phone]

## 2016-03-16 NOTE — ED Notes (Signed)
Per EMS Patient is having upper left quad pain that radiates to his back. Patient has hx chronic pancreatis. Patient stated that he was drinking yesterday. Patient was released 4 days ago from the hospital.

## 2016-03-16 NOTE — ED Notes (Signed)
Bed: [facility] Expected date:  Expected time:  Means of arrival:  Comments: EMS abd pain / pancreatitis

## 2016-03-16 NOTE — Discharge Instructions (Signed)
DO NOT DRINK ALCOHOL  Continue taking the pain medications prescribed to you at your most recent hospital discharge. Drink plenty of fluids. yOU may take ibuprofen for persistent pain. Follow-up with YOUr primary care doctor.  Low-Fat Diet for Pancreatitis or Gallbladder Conditions A low-fat diet can be helpful if you have pancreatitis or a gallbladder condition. With these conditions, your pancreas and gallbladder have trouble digesting fats. A healthy eating plan with less fat will help rest your pancreas and gallbladder and reduce your symptoms. WHAT DO I NEED TO KNOW ABOUT THIS DIET?  Eat a low-fat diet.  Reduce your fat intake to less than 20-30% of your total daily calories. This is less than 50-60 g of fat per day.  Remember that you need some fat in your diet. Ask your dietician what your daily goal should be.  Choose nonfat and low-fat healthy foods. Look for the words "nonfat," "low fat," or "fat free."  As a guide, look on the label and choose foods with less than 3 g of fat per serving. Eat only one serving.  Avoid alcohol.  Do not smoke. If you need help quitting, talk with your health care provider.  Eat small frequent meals instead of three large heavy meals. WHAT FOODS CAN I EAT? Grains Include healthy grains and starches such as potatoes, wheat bread, fiber-rich cereal, and brown rice. Choose whole grain options whenever possible. In adults, whole grains should account for 45-65% of your daily calories.  Fruits and Vegetables Eat plenty of fruits and vegetables. Fresh fruits and vegetables add fiber to your diet. Meats and Other Protein Sources Eat lean meat such as chicken and pork. Trim any fat off of meat before cooking it. Eggs, fish, and beans are other sources of protein. In adults, these foods should account for 10-35% of your daily calories. Dairy Choose low-fat milk and dairy options. Dairy includes fat and protein, as well as calcium.  Fats and Oils Limit  high-fat foods such as fried foods, sweets, baked goods, sugary drinks.  Other Creamy sauces and condiments, such as mayonnaise, can add extra fat. Think about whether or not you need to use them, or use smaller amounts or low fat options. WHAT FOODS ARE NOT RECOMMENDED?  High fat foods, such as:  [employer].  Ice cream.  [place] toast.  Sweet rolls.  Pizza.  Cheese bread.  Foods covered with batter, butter, creamy sauces, or cheese.  Fried foods.  Sugary drinks and desserts.  Foods that cause gas or bloating   This information is not intended to replace advice given to you by your health care provider. Make sure you discuss any questions you have with your health care provider.   Document Released: 09/20/2013 Document Reviewed: 09/20/2013 Elsevier Interactive Patient Education [employer].

## 2016-04-03 MED FILL — ?PANTOPRAZOLE SOD DR 40MG: 40 MG | 30 days supply | Qty: 30 | Fill #0

## 2016-04-03 MED FILL — ?AMLODIPINE BESYLATE 5 MG T: 5 | 30 days supply | Qty: 30 | Fill #3

## 2016-05-02 ENCOUNTER — Inpatient Hospital Stay (HOSPITAL_COMMUNITY)
Admission: EM | Admit: 2016-05-02 | Discharge: 2016-05-09 | DRG: 438 | Disposition: A | Discharge status: Home / Self Care | Payer: Self-pay | Attending: Internal Medicine | Admitting: Internal Medicine

## 2016-05-02 ENCOUNTER — Encounter (HOSPITAL_COMMUNITY): Payer: Self-pay

## 2016-05-02 DIAGNOSIS — Z720 Tobacco use: Secondary | ICD-10-CM | POA: Diagnosis present

## 2016-05-02 DIAGNOSIS — G43909 Migraine, unspecified, not intractable, without status migrainosus: Secondary | ICD-10-CM | POA: Diagnosis present

## 2016-05-02 DIAGNOSIS — L97502 Non-pressure chronic ulcer of other part of unspecified foot with fat layer exposed: Secondary | ICD-10-CM

## 2016-05-02 DIAGNOSIS — K859 Acute pancreatitis without necrosis or infection, unspecified: Principal | ICD-10-CM | POA: Diagnosis present

## 2016-05-02 DIAGNOSIS — R1110 Vomiting, unspecified: Secondary | ICD-10-CM | POA: Diagnosis present

## 2016-05-02 DIAGNOSIS — Z87891 Personal history of nicotine dependence: Secondary | ICD-10-CM

## 2016-05-02 DIAGNOSIS — F1010 Alcohol abuse, uncomplicated: Secondary | ICD-10-CM | POA: Diagnosis present

## 2016-05-02 DIAGNOSIS — L97519 Non-pressure chronic ulcer of other part of right foot with unspecified severity: Secondary | ICD-10-CM

## 2016-05-02 DIAGNOSIS — K8681 Exocrine pancreatic insufficiency: Secondary | ICD-10-CM | POA: Diagnosis present

## 2016-05-02 DIAGNOSIS — K219 Gastro-esophageal reflux disease without esophagitis: Secondary | ICD-10-CM | POA: Diagnosis present

## 2016-05-02 DIAGNOSIS — Z90411 Acquired partial absence of pancreas: Secondary | ICD-10-CM

## 2016-05-02 DIAGNOSIS — R112 Nausea with vomiting, unspecified: Secondary | ICD-10-CM

## 2016-05-02 DIAGNOSIS — Z8507 Personal history of malignant neoplasm of pancreas: Secondary | ICD-10-CM

## 2016-05-02 DIAGNOSIS — Z79899 Other long term (current) drug therapy: Secondary | ICD-10-CM

## 2016-05-02 DIAGNOSIS — F17210 Nicotine dependence, cigarettes, uncomplicated: Secondary | ICD-10-CM | POA: Diagnosis present

## 2016-05-02 DIAGNOSIS — A498 Other bacterial infections of unspecified site: Secondary | ICD-10-CM

## 2016-05-02 DIAGNOSIS — Z681 Body mass index (BMI) 19 or less, adult: Secondary | ICD-10-CM

## 2016-05-02 DIAGNOSIS — R7881 Bacteremia: Secondary | ICD-10-CM

## 2016-05-02 DIAGNOSIS — R1010 Upper abdominal pain, unspecified: Secondary | ICD-10-CM

## 2016-05-02 DIAGNOSIS — K861 Other chronic pancreatitis: Secondary | ICD-10-CM | POA: Diagnosis present

## 2016-05-02 DIAGNOSIS — B961 Klebsiella pneumoniae [K. pneumoniae] as the cause of diseases classified elsewhere: Secondary | ICD-10-CM | POA: Diagnosis present

## 2016-05-02 DIAGNOSIS — K760 Fatty (change of) liver, not elsewhere classified: Secondary | ICD-10-CM | POA: Diagnosis present

## 2016-05-02 DIAGNOSIS — E43 Unspecified severe protein-calorie malnutrition: Secondary | ICD-10-CM | POA: Diagnosis present

## 2016-05-02 DIAGNOSIS — I10 Essential (primary) hypertension: Secondary | ICD-10-CM | POA: Diagnosis present

## 2016-05-02 LAB — CBC WITH DIFFERENTIAL/PLATELET
Basophils Absolute: 0 10*3/uL (ref 0.0–0.1)
Basophils Relative: 0 %
Eosinophils Absolute: 0 10*3/uL (ref 0.0–0.7)
Eosinophils Relative: 0 %
HCT: 44.2 % (ref 39.0–52.0)
Hemoglobin: 14.8 g/dL (ref 13.0–17.0)
Lymphocytes Relative: 12 %
Lymphs Abs: 1.7 10*3/uL (ref 0.7–4.0)
MCH: 28.2 pg (ref 26.0–34.0)
MCHC: 33.5 g/dL (ref 30.0–36.0)
MCV: 84.2 fL (ref 78.0–100.0)
Monocytes Absolute: 1 10*3/uL (ref 0.1–1.0)
Monocytes Relative: 7 %
Neutro Abs: 11.7 10*3/uL — ABNORMAL HIGH (ref 1.7–7.7)
Neutrophils Relative %: 81 %
Platelets: 270 10*3/uL (ref 150–400)
RBC: 5.25 MIL/uL (ref 4.22–5.81)
RDW: 15.1 % (ref 11.5–15.5)
WBC: 14.3 10*3/uL — ABNORMAL HIGH (ref 4.0–10.5)

## 2016-05-02 LAB — COMPREHENSIVE METABOLIC PANEL
ALT: 24 U/L (ref 17–63)
AST: 28 U/L (ref 15–41)
Albumin: 4.4 g/dL (ref 3.5–5.0)
Alkaline Phosphatase: 156 U/L — ABNORMAL HIGH (ref 38–126)
Anion gap: 18 — ABNORMAL HIGH (ref 5–15)
BUN: 9 mg/dL (ref 6–20)
CO2: 20 mmol/L — ABNORMAL LOW (ref 22–32)
Calcium: 9.8 mg/dL (ref 8.9–10.3)
Chloride: 96 mmol/L — ABNORMAL LOW (ref 101–111)
Creatinine, Ser: 1 mg/dL (ref 0.61–1.24)
GFR calc Af Amer: >60 mL/min (ref >60)
GFR calc non Af Amer: >60 mL/min (ref >60)
Glucose, Bld: 91 mg/dL (ref 65–99)
Potassium: 4.9 mmol/L (ref 3.5–5.1)
Sodium: 134 mmol/L — ABNORMAL LOW (ref 135–145)
Total Bilirubin: 1.5 mg/dL — ABNORMAL HIGH (ref 0.3–1.2)
Total Protein: 7.9 g/dL (ref 6.5–8.1)

## 2016-05-02 LAB — LIPASE, BLOOD: Lipase: 25 U/L (ref 11–51)

## 2016-05-02 LAB — POC OCCULT BLOOD, ED: Fecal Occult Bld: NEGATIVE

## 2016-05-02 MED ORDER — MORPHINE SULFATE (PF) 4 MG/ML IV SOLN
4.0000 mg | Freq: Once | INTRAVENOUS | Status: AC
  Administered 2016-05-02: 4 mg via INTRAVENOUS
  Filled 2016-05-02: qty 1

## 2016-05-02 MED ORDER — SODIUM CHLORIDE 0.9 % IV SOLN
Freq: Once | INTRAVENOUS | Status: AC
Start: 2016-05-02 — End: 2016-05-03
  Administered 2016-05-02: 23:00:00 via INTRAVENOUS

## 2016-05-02 MED ORDER — SODIUM CHLORIDE 0.9 % IV SOLN
Freq: Once | INTRAVENOUS | Status: AC
  Administered 2016-05-02: 22:00:00 via INTRAVENOUS

## 2016-05-02 MED ORDER — ONDANSETRON HCL 4 MG/2ML IJ SOLN
4.0000 mg | Freq: Once | INTRAMUSCULAR | Status: AC
  Administered 2016-05-02: 4 mg via INTRAVENOUS
  Filled 2016-05-02: qty 2

## 2016-05-02 NOTE — ED Triage Notes (Signed)
Patient comes from EMS for chest pain and abdominal pain that started this evening.  Patient had a whipple procedure in 2015 and has had some pancreatic flare ups and says this feels similar.  The chest pain however, is not consistent with the pancreatic flare ups per patient. Patient A&Ox4 at this time

## 2016-05-02 NOTE — ED Provider Notes (Signed)
[facility] DEPT Provider Note   CSN: [id number] Arrival date & time: 05/02/16  2119  First Provider Contact:  First MD Initiated Contact with Patient 05/02/16 2136        History   Chief Complaint Chief Complaint  Patient presents with  . Chest Pain  . Abdominal Pain    HPI Brent Rogers is a 50 y.o. male.  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  Since that time he has had frequent ED visits and hospitalizations for dehydration and pancreatitis.  He states about 3:00 yesterday afternoon he started having discomfort, nausea.  He's been unable to keep any fluids or food down despite the use of Phenergan.  He does take Tylenol Extra Strength for pain, but has been unable to tolerate that as well. Patient reports normal bowel movement yesterday, but he did note that was dark in color.  He states he has been using Pepto-Bismol.      Past Medical History:  Diagnosis Date  . Chronic pancreatitis ([place])    S/P Whipple  . Hypertension   . Migraine    "@ least once/month" (11/12/2015)  . Pancreatic abnormality    CT  shows mass  . Pneumonia 11/12/2015  . Scoliosis     Patient Active Problem List   Diagnosis Date Noted  . Pancreatitis 03/07/2016  . Intractable nausea and vomiting 03/07/2016  . Foot callus 12/25/2015  . Onychomycosis of toenail 12/25/2015  . Fatty liver 11/12/2015  . Elevated alkaline phosphatase level 11/12/2015  . GERD (gastroesophageal reflux disease) 11/12/2015  . Diarrhea 10/15/2015  . HTN (hypertension) 07/10/2015  . S/P cholecystectomy 07/10/2015  . Smoking 12/26/2014  . Protein-calorie malnutrition, severe ([place]) 11/20/2014  . Recurrent pancreatitis ([place]) 06/06/2014  . Exocrine pancreatic insufficiency ([place]) 12/23/2013    Past Surgical History:  Procedure Laterality Date  . BACK SURGERY    . EUS N/A 09/21/2013   Procedure: ESOPHAGEAL ENDOSCOPIC ULTRASOUND  (EUS) RADIAL;  Surgeon: Beryle Beams, MD;  Location: [facility];  Service: Endoscopy;  Laterality: N/A;  . EUS N/A 09/30/2013   Procedure: UPPER ENDOSCOPIC ULTRASOUND (EUS) LINEAR;  Surgeon: Beryle Beams, MD;  Location: [facility];  Service: Endoscopy;  Laterality: N/A;  . FINE NEEDLE ASPIRATION N/A 09/21/2013   Procedure: FINE NEEDLE ASPIRATION (FNA) LINEAR;  Surgeon: Beryle Beams, MD;  Location: [facility];  Service: Endoscopy;  Laterality: N/A;  . FINGER FRACTURE SURGERY Left 1995   5th digit  . FRACTURE SURGERY    . KNEE ARTHROSCOPY Bilateral 1987-1989   right-left  . LAPAROSCOPY N/A 12/01/2013   Procedure: LAPAROSCOPY DIAGNOSTIC PANCREATICODUODENECTOMY WITH BILIARY AND PANCREATIC STENTS;  Surgeon: Stark Klein, MD;  Location: [facility];  Service: General;  Laterality: N/A;  . LUMBAR [place] SURGERY  1990's  . WHIPPLE PROCEDURE  12/01/2013       Home Medications    Prior to Admission medications   Medication Sig Start Date End Date Taking? Authorizing Provider  acetaminophen (TYLENOL) 500 MG tablet Take 1 tablet (500 mg total) by mouth every 6 (six) hours as needed for moderate pain (stomach pain). 03/11/16   Hosie Poisson, MD  amLODipine (NORVASC) 5 MG tablet Take 1 tablet (5 mg total) by mouth daily. 12/25/15   Josalyn Funches, MD  bismuth subsalicylate (PEPTO BISMOL) 262 MG/15ML suspension Take 30 mLs by mouth every 6 (six) hours as needed (stomach ache).    Historical Provider, MD  oxyCODONE-acetaminophen (PERCOCET/ROXICET) 5-325 MG tablet Take 1-2 tablets by mouth every 8 (eight) hours as needed for moderate pain or severe pain. 03/11/16   Hosie Poisson, MD  Pancrelipase, Lip-Prot-Amyl, 24000 units CPEP Take 1 capsule (24,000 Units total) by mouth at bedtime. 10/15/15   Josalyn Funches, MD  pantoprazole (PROTONIX) 40 MG tablet Take 1 tablet (40 mg total) by mouth daily. 03/11/16   Hosie Poisson, MD  promethazine (PHENERGAN) 25 MG tablet Take 1 tablet (25 mg total) by mouth every 8  (eight) hours as needed for nausea or vomiting. 03/11/16   Hosie Poisson, MD    Family History Family History  Problem Relation Age of Onset  . Cancer Mother     unsure  . Hypertension Father     Social History Social History  Substance Use Topics  . Smoking status: Current Every Day Smoker    Packs/day: 0.12    Years: 30.00    Types: Cigarettes  . Smokeless tobacco: Former Systems developer    Types: Snuff     Comment: "used snuff 10-15 years in my 20s-30s"  . Alcohol use 2.4 oz/week    4 Cans of beer per week     Comment: 11/12/2015 "1 40oz beer and 1 12 oz beer/wk"     Allergies   Review of patient's allergies indicates no known allergies.   Review of Systems Review of Systems  Respiratory: Negative for shortness of breath.   Gastrointestinal: Positive for abdominal pain, nausea and vomiting.  All other systems reviewed and are negative.    Physical Exam Updated Vital Signs BP 137/99   Pulse 101   Temp 98.9 F (37.2 C) (Oral)   Resp 18   SpO2 98%   Physical Exam  Constitutional: He appears well-developed and well-nourished. He appears distressed.  HENT:  Head: Normocephalic.  Eyes: Pupils are equal, round, and reactive to light.  Neck: Normal range of motion.  Cardiovascular: Tachycardia present.   Pulmonary/Chest: Effort normal.  Abdominal: Bowel sounds are decreased. There is tenderness in the left upper quadrant and left lower quadrant.  Neurological: He is alert.  Skin: Skin is warm. He is not diaphoretic.  Psychiatric: He has a normal mood and affect.  Nursing note and vitals reviewed.    ED Treatments / Results  Labs (all labs ordered are listed, but only abnormal results are displayed) Labs Reviewed  CBC WITH DIFFERENTIAL/PLATELET  COMPREHENSIVE METABOLIC PANEL  LIPASE, BLOOD    EKG  EKG Interpretation None       Radiology No results found.  Procedures Procedures (including critical care time)  Medications Ordered in ED Medications    0.9 %  sodium chloride infusion (not administered)  morphine 4 MG/ML injection 4 mg (not administered)  ondansetron (ZOFRAN) injection 4 mg (not administered)     Initial Impression / Assessment and Plan / ED Course  I have reviewed the triage vital signs and the nursing notes.  Pertinent labs & imaging results that were available during my care of the patient were reviewed by me and considered in my medical decision making (see chart for details).  Clinical Course   Labs reviewed.  Patient has elevated alkaline phosphatase,  Lipase is normal.  After 1 L fluid, Zofran, and 8 mg of morphine.  Patient has had no relief of his pain.  He does have an unexplained anion gap of 18-will be continued on IV fluids.  Of note, his total bilirubin is slightly elevated at 1.5 Patient has been observed in the  emergency department.  He has received IV fluids and multiple doses of pain medication for pain control.  He still is having persistent pain, nausea and occasional vomiting.   Final Clinical Impressions(s) / ED Diagnoses   Final diagnoses:  None    New Prescriptions New Prescriptions   No medications on file     Junius Creamer, NP 05/02/16 [place], NP 05/03/16 0120    Junius Creamer, NP 05/03/16 [place], MD 05/03/16 [phone]

## 2016-05-03 ENCOUNTER — Emergency Department (HOSPITAL_COMMUNITY): Payer: Self-pay

## 2016-05-03 ENCOUNTER — Encounter (HOSPITAL_COMMUNITY): Payer: Self-pay | Admitting: Radiology

## 2016-05-03 DIAGNOSIS — F1010 Alcohol abuse, uncomplicated: Secondary | ICD-10-CM | POA: Diagnosis present

## 2016-05-03 LAB — PHOSPHORUS: Phosphorus: 2 mg/dL — ABNORMAL LOW (ref 2.5–4.6)

## 2016-05-03 LAB — RAPID URINE DRUG SCREEN, HOSP PERFORMED
Amphetamines: NOT DETECTED
Barbiturates: NOT DETECTED
Benzodiazepines: NOT DETECTED
Cocaine: NOT DETECTED
Opiates: POSITIVE — AB
Tetrahydrocannabinol: NOT DETECTED

## 2016-05-03 LAB — URINALYSIS, ROUTINE W REFLEX MICROSCOPIC
Bilirubin Urine: NEGATIVE
Glucose, UA: NEGATIVE mg/dL
Hgb urine dipstick: NEGATIVE
Ketones, ur: >80 mg/dL — AB
Leukocytes, UA: NEGATIVE
Nitrite: NEGATIVE
Protein, ur: NEGATIVE mg/dL
Specific Gravity, Urine: 1.04 — ABNORMAL HIGH (ref 1.005–1.030)
pH: 5 (ref 5.0–8.0)

## 2016-05-03 LAB — PREALBUMIN: Prealbumin: 12.2 mg/dL — ABNORMAL LOW (ref 18–38)

## 2016-05-03 LAB — MAGNESIUM: Magnesium: 1.6 mg/dL — ABNORMAL LOW (ref 1.7–2.4)

## 2016-05-03 LAB — ETHANOL: Alcohol, Ethyl (B): <5 mg/dL (ref <5)

## 2016-05-03 MED ORDER — SODIUM CHLORIDE 0.9 % IV SOLN
INTRAVENOUS | Status: DC
  Administered 2016-05-03 – 2016-05-07 (×11): via INTRAVENOUS
  Administered 2016-05-07: 125 mL via INTRAVENOUS
  Administered 2016-05-08 (×2): via INTRAVENOUS

## 2016-05-03 MED ORDER — PROMETHAZINE HCL 25 MG/ML IJ SOLN
25.0000 mg | Freq: Four times a day (QID) | INTRAMUSCULAR | Status: DC | PRN
  Administered 2016-05-07 – 2016-05-09 (×4): 25 mg via INTRAVENOUS
  Filled 2016-05-03 (×5): qty 1

## 2016-05-03 MED ORDER — HYDROMORPHONE HCL 1 MG/ML IJ SOLN
1.0000 mg | INTRAMUSCULAR | Status: AC | PRN
  Administered 2016-05-03 (×2): 1 mg via INTRAVENOUS
  Filled 2016-05-03 (×2): qty 1

## 2016-05-03 MED ORDER — THIAMINE HCL 100 MG/ML IJ SOLN: 100.0000 mg | Freq: Every day | INTRAMUSCULAR | Status: DC

## 2016-05-03 MED ORDER — HYDROMORPHONE HCL 1 MG/ML IJ SOLN
1.0000 mg | Freq: Once | INTRAMUSCULAR | Status: AC
  Administered 2016-05-03: 1 mg via INTRAVENOUS
  Filled 2016-05-03: qty 1

## 2016-05-03 MED ORDER — MORPHINE SULFATE (PF) 2 MG/ML IV SOLN
1.0000 mg | INTRAVENOUS | Status: DC | PRN
  Administered 2016-05-03 (×5): 2 mg via INTRAVENOUS
  Administered 2016-05-04 (×2): 4 mg via INTRAVENOUS
  Administered 2016-05-04 – 2016-05-05 (×4): 2 mg via INTRAVENOUS
  Administered 2016-05-05 – 2016-05-06 (×2): 4 mg via INTRAVENOUS
  Filled 2016-05-03 (×3): qty 2
  Filled 2016-05-03 (×9): qty 1
  Filled 2016-05-03 (×3): qty 2

## 2016-05-03 MED ORDER — LORAZEPAM 2 MG/ML IJ SOLN
1.0000 mg | Freq: Four times a day (QID) | INTRAMUSCULAR | Status: AC | PRN
  Administered 2016-05-03: 1 mg via INTRAVENOUS
  Filled 2016-05-03: qty 1

## 2016-05-03 MED ORDER — ENOXAPARIN SODIUM 40 MG/0.4ML ~~LOC~~ SOLN
40.0000 mg | SUBCUTANEOUS | Status: DC
  Administered 2016-05-03 – 2016-05-09 (×7): 40 mg via SUBCUTANEOUS
  Filled 2016-05-03 (×7): qty 0.4

## 2016-05-03 MED ORDER — LORAZEPAM 2 MG/ML IJ SOLN
0.0000 mg | Freq: Four times a day (QID) | INTRAMUSCULAR | Status: AC
  Administered 2016-05-04 – 2016-05-05 (×3): 2 mg via INTRAVENOUS
  Filled 2016-05-03 (×4): qty 1

## 2016-05-03 MED ORDER — ADULT MULTIVITAMIN W/MINERALS CH
1.0000 | ORAL_TABLET | Freq: Every day | ORAL | Status: DC
  Administered 2016-05-03 – 2016-05-09 (×7): 1 via ORAL
  Filled 2016-05-03 (×7): qty 1

## 2016-05-03 MED ORDER — LORAZEPAM 2 MG/ML IJ SOLN
0.0000 mg | Freq: Two times a day (BID) | INTRAMUSCULAR | Status: AC
  Administered 2016-05-06: 2 mg via INTRAVENOUS
  Filled 2016-05-03: qty 1

## 2016-05-03 MED ORDER — ACETAMINOPHEN 650 MG RE SUPP: 650.0000 mg | Freq: Four times a day (QID) | RECTAL | Status: DC | PRN

## 2016-05-03 MED ORDER — METOCLOPRAMIDE HCL 5 MG/ML IJ SOLN
10.0000 mg | Freq: Once | INTRAMUSCULAR | Status: AC
  Administered 2016-05-03: 10 mg via INTRAVENOUS
  Filled 2016-05-03: qty 2

## 2016-05-03 MED ORDER — AMLODIPINE BESYLATE 5 MG PO TABS
5.0000 mg | ORAL_TABLET | Freq: Every day | ORAL | Status: DC
  Administered 2016-05-03 – 2016-05-06 (×4): 5 mg via ORAL
  Filled 2016-05-03 (×4): qty 1

## 2016-05-03 MED ORDER — PANTOPRAZOLE SODIUM 40 MG PO TBEC
40.0000 mg | DELAYED_RELEASE_TABLET | Freq: Every day | ORAL | Status: DC
  Administered 2016-05-03 – 2016-05-09 (×7): 40 mg via ORAL
  Filled 2016-05-03 (×7): qty 1

## 2016-05-03 MED ORDER — ACETAMINOPHEN 325 MG PO TABS: 650.0000 mg | ORAL_TABLET | Freq: Four times a day (QID) | ORAL | Status: DC | PRN

## 2016-05-03 MED ORDER — IOPAMIDOL (ISOVUE-300) INJECTION 61%
INTRAVENOUS | Status: AC
  Administered 2016-05-03: 100 mL
  Filled 2016-05-03: qty 100

## 2016-05-03 MED ORDER — FOLIC ACID 1 MG PO TABS
1.0000 mg | ORAL_TABLET | Freq: Every day | ORAL | Status: DC
  Administered 2016-05-03 – 2016-05-09 (×7): 1 mg via ORAL
  Filled 2016-05-03 (×7): qty 1

## 2016-05-03 MED ORDER — LORAZEPAM 1 MG PO TABS: 1.0000 mg | ORAL_TABLET | Freq: Four times a day (QID) | ORAL | Status: AC | PRN

## 2016-05-03 MED ORDER — ONDANSETRON HCL 4 MG/2ML IJ SOLN
4.0000 mg | Freq: Four times a day (QID) | INTRAMUSCULAR | Status: DC | PRN
  Administered 2016-05-03 – 2016-05-09 (×8): 4 mg via INTRAVENOUS
  Filled 2016-05-03 (×9): qty 2

## 2016-05-03 MED ORDER — VITAMIN B-1 100 MG PO TABS
100.0000 mg | ORAL_TABLET | Freq: Every day | ORAL | Status: DC
  Administered 2016-05-03 – 2016-05-09 (×7): 100 mg via ORAL
  Filled 2016-05-03 (×7): qty 1

## 2016-05-03 NOTE — ED Notes (Signed)
PAGED TRH TO [facility]

## 2016-05-03 NOTE — ED Notes (Signed)
Attempted report 

## 2016-05-03 NOTE — ED Notes (Signed)
Report given to [employer].  Patient moved to [place] in the ED

## 2016-05-03 NOTE — H&P (Signed)
History and Physical    Brent Rogers [id number] DOB: 1966-08-19 DOA: 05/02/2016   PCP: Minerva Ends, MD /UNASSIGNED  Patient coming from/Resides with: Private residence/lives alone  Chief Complaint: Intractable nausea and vomiting with left-sided abdominal pain  HPI: Brent Rogers is a 50 y.o. male with medical history significant for prior Whipple procedure for pancreatic mass in 2015, ongoing intermittent alcohol use and ongoing tobacco abuse, hypertension, severe protein calorie malnutrition, reflux, fatty liver disease. Patient has a history of frequent hospital admissions for exacerbation of pancreatitis manifesting as abdominal pain w/ nausea and vomiting. Patient was recently discharged on 6/13 after similar episode. At that time it was surmised that recent alcohol ingestion precipitated episode.  Today he presents with acute onset of abdominal pain yesterday associated with nausea and vomiting but no diarrhea. The pain is primarily located in the left middle quadrant radiating down to the left lower quadrant. Patient admits to drinking "several beers" the day before for his brother's birthday. He denies daily alcohol ingestion.  ED Course:  Vital signs: 98.9-135/92-91-18-RA 99% CT abdomen and pelvis with contrast: Diffuse pancreatic edema that has actually improved compared to prior CT scan; no. Pancreatic collection abscess or pseudocyst. No bowel obstruction Lab data: Sodium 134, potassium 4.9, chloride 96, CO2 20, BUN 9, creatinine 1.00, anion gap 18, alkaline phosphatase 156, total bilirubin 1.5, white count 14,300 with neutrophils 81% and absolute neutrophils 11.7, glucose 91, urinalysis unremarkable except for ketones greater than 80 and specific gravity 1.040, fecal occult blood negative; lipase is normal Medications and treatments: IV normal saline at 125 mL an hour, morphine 4 mg IV 4 doses, Dilaudid 1 mg IV 3 doses, Reglan 10 mg IV 1   Review of Systems:  In  addition to the HPI above,  No Fever-chills, myalgias or other constitutional symptoms No Headache, changes with Vision or hearing, new weakness, tingling, numbness in any extremity, No problems swallowing food or Liquids, indigestion/reflux No Chest pain, Cough or Shortness of Breath, palpitations, orthopnea or DOE No melena or hematochezia, no dark tarry stools, Bowel movements are regular, No dysuria, hematuria or flank pain No new skin rashes, lesions, masses or bruises, No new joints pains-aches No recent weight gain  No polyuria, polydypsia or polyphagia,   Past Medical History:  Diagnosis Date  . Chronic pancreatitis ([place])    S/P Whipple  . Hypertension   . Migraine    "@ least once/month" (11/12/2015)  . Pancreatic abnormality    CT  shows mass  . Pneumonia 11/12/2015  . Scoliosis     Past Surgical History:  Procedure Laterality Date  . BACK SURGERY    . EUS N/A 09/21/2013   Procedure: ESOPHAGEAL ENDOSCOPIC ULTRASOUND (EUS) RADIAL;  Surgeon: Beryle Beams, MD;  Location: [facility];  Service: Endoscopy;  Laterality: N/A;  . EUS N/A 09/30/2013   Procedure: UPPER ENDOSCOPIC ULTRASOUND (EUS) LINEAR;  Surgeon: Beryle Beams, MD;  Location: [facility];  Service: Endoscopy;  Laterality: N/A;  . FINE NEEDLE ASPIRATION N/A 09/21/2013   Procedure: FINE NEEDLE ASPIRATION (FNA) LINEAR;  Surgeon: Beryle Beams, MD;  Location: [facility];  Service: Endoscopy;  Laterality: N/A;  . FINGER FRACTURE SURGERY Left 1995   5th digit  . FRACTURE SURGERY    . KNEE ARTHROSCOPY Bilateral 1987-1989   right-left  . LAPAROSCOPY N/A 12/01/2013   Procedure: LAPAROSCOPY DIAGNOSTIC PANCREATICODUODENECTOMY WITH BILIARY AND PANCREATIC STENTS;  Surgeon: Stark Klein, MD;  Location: [facility];  Service: General;  Laterality: N/A;  .  LUMBAR [place] SURGERY  1990's  . WHIPPLE PROCEDURE  12/01/2013    Social History   Social History  . Marital status: Divorced    Spouse name: N/A  . Number of  children: N/A  . Years of education: N/A   Occupational History  . Not on file.   Social History Main Topics  . Smoking status: Current Every Day Smoker    Packs/day: 0.12    Years: 30.00    Types: Cigarettes  . Smokeless tobacco: Former Systems developer    Types: Snuff     Comment: "used snuff 10-15 years in my 20s-30s"  . Alcohol use 2.4 oz/week    4 Cans of beer per week     Comment: 11/12/2015 "1 40oz beer and 1 12 oz beer/wk"  . Drug use: No  . Sexual activity: Not Currently    Partners: Male   Other Topics Concern  . Not on file   Social History Narrative  . No narrative on file    Mobility: Without assistive devices Work history: In the process of applying for disability   No Known Allergies  Family History  Problem Relation Age of Onset  . Cancer Mother     unsure  . Hypertension Father      Prior to Admission medications   Medication Sig Start Date End Date Taking? Authorizing Provider  acetaminophen (TYLENOL) 500 MG tablet Take 1 tablet (500 mg total) by mouth every 6 (six) hours as needed for moderate pain (stomach pain). 03/11/16  Yes Hosie Poisson, MD  amLODipine (NORVASC) 5 MG tablet Take 1 tablet (5 mg total) by mouth daily. 12/25/15  Yes Josalyn Funches, MD  bismuth subsalicylate (PEPTO BISMOL) 262 MG/15ML suspension Take 30 mLs by mouth every 6 (six) hours as needed (stomach ache).   Yes Historical Provider, MD  oxyCODONE-acetaminophen (PERCOCET/ROXICET) 5-325 MG tablet Take 1-2 tablets by mouth every 8 (eight) hours as needed for moderate pain or severe pain. 03/11/16  Yes Hosie Poisson, MD  Pancrelipase, Lip-Prot-Amyl, 24000 units CPEP Take 1 capsule (24,000 Units total) by mouth at bedtime. 10/15/15  Yes Josalyn Funches, MD  pantoprazole (PROTONIX) 40 MG tablet Take 1 tablet (40 mg total) by mouth daily. 03/11/16  Yes Hosie Poisson, MD  promethazine (PHENERGAN) 25 MG tablet Take 1 tablet (25 mg total) by mouth every 8 (eight) hours as needed for nausea or vomiting.  03/11/16  Yes Hosie Poisson, MD    Physical Exam: Vitals:   05/03/16 0530 05/03/16 0600 05/03/16 0630 05/03/16 0700  BP: (!) 150/110 (!) 149/105 (!) 140/102 140/98  Pulse: 74 73 75 68  Resp: 15 10 18 17   Temp:      TempSrc:      SpO2: 100% 100% 97% 98%      Constitutional: NAD, calm, comfortable Eyes: PERRL, lids and conjunctivae normal ENMT: Mucous membranes are moist. Posterior pharynx clear of any exudate or lesions.Normal dentition.  Neck: normal, supple, no masses, no thyromegaly Respiratory: clear to auscultation bilaterally, no wheezing, no crackles. Normal respiratory effort. No accessory muscle use.  Cardiovascular: Regular rate and rhythm, no murmurs / rubs / gallops. No extremity edema. 2+ pedal pulses. No carotid bruits.  Abdomen: Focally tender primarily left lower quadrant guarding or rebounding, no masses palpated. No hepatosplenomegaly. Bowel sounds positive.  Musculoskeletal: no clubbing / cyanosis. No joint deformity upper and lower extremities. Good ROM, no contractures. Normal muscle tone.  Skin: no rashes, lesions, ulcers. No induration Neurologic: CN 2-12 grossly intact. Sensation intact, DTR  normal. Strength 5/5 x all 4 extremities.  Psychiatric: Normal judgment and insight. Alert and oriented x 3. Normal mood.    Labs on Admission: I have personally reviewed following labs and imaging studies  CBC:  Recent Labs Lab 05/02/16 2142  WBC 14.3*  NEUTROABS 11.7*  HGB 14.8  HCT 44.2  MCV 84.2  PLT [id number]   Basic Metabolic Panel:  Recent Labs Lab 05/02/16 2142  NA 134*  K 4.9  CL 96*  CO2 20*  GLUCOSE 91  BUN 9  CREATININE 1.00  CALCIUM 9.8   GFR: CrCl cannot be calculated (Unknown ideal weight.). Liver Function Tests:  Recent Labs Lab 05/02/16 2142  AST 28  ALT 24  ALKPHOS 156*  BILITOT 1.5*  PROT 7.9  ALBUMIN 4.4    Recent Labs Lab 05/02/16 2142  LIPASE 25   No results for input(s): AMMONIA in the last 168 hours. Coagulation  Profile: No results for input(s): INR, PROTIME in the last 168 hours. Cardiac Enzymes: No results for input(s): CKTOTAL, CKMB, CKMBINDEX, TROPONINI in the last 168 hours. BNP (last 3 results) No results for input(s): PROBNP in the last 8760 hours. HbA1C: No results for input(s): HGBA1C in the last 72 hours. CBG: No results for input(s): GLUCAP in the last 168 hours. Lipid Profile: No results for input(s): CHOL, HDL, LDLCALC, TRIG, CHOLHDL, LDLDIRECT in the last 72 hours. Thyroid Function Tests: No results for input(s): TSH, T4TOTAL, FREET4, T3FREE, THYROIDAB in the last 72 hours. Anemia Panel: No results for input(s): VITAMINB12, FOLATE, FERRITIN, TIBC, IRON, RETICCTPCT in the last 72 hours. Urine analysis:    Component Value Date/Time   COLORURINE YELLOW 05/03/2016 [place] 05/03/2016 0607   LABSPEC 1.040 (H) 05/03/2016 0607   PHURINE 5.0 05/03/2016 0607   GLUCOSEU NEGATIVE 05/03/2016 0607   HGBUR NEGATIVE 05/03/2016 0607   BILIRUBINUR NEGATIVE 05/03/2016 0607   KETONESUR >80 (A) 05/03/2016 0607   PROTEINUR NEGATIVE 05/03/2016 0607   UROBILINOGEN 1.0 06/23/2015 1513   NITRITE NEGATIVE 05/03/2016 0607   LEUKOCYTESUR NEGATIVE 05/03/2016 0607   Sepsis Labs: @LABRCNTIP (procalcitonin:4,lacticidven:4) )No results found for this or any previous visit (from the past 240 hour(s)).   Radiological Exams on Admission: Ct Abdomen Pelvis W Contrast  Result Date: 05/03/2016 CLINICAL DATA:  50 year old male with pancreatitis. Follow-up scan. History of Whipple's procedure. EXAM: CT ABDOMEN AND PELVIS WITH CONTRAST TECHNIQUE: Multidetector CT imaging of the abdomen and pelvis was performed using the standard protocol following bolus administration of intravenous contrast. CONTRAST:  1 ISOVUE-300 IOPAMIDOL (ISOVUE-300) INJECTION 61% COMPARISON:  CT dated 03/07/2016 FINDINGS: O the visualized lung bases are clear. No intra-abdominal free air or free fluid. There is mild apparent  diffuse fatty infiltration of the liver. Small subcapsular areas of hypodensity involving the inferior surface of the left lobe of the liver are not well characterized but may represent focal areas of subcapsular edema, small hepatic contusions or laceration. There is no hematoma or fluid collection. No contrast extravasation. Cholecystectomy. There is postsurgical changes of the elbow with resection of the head of the pancreas. There has been interval improvement of the previously seen inflamed appearance of the pancreas with residual inflammatory changes in the peripancreatic fat. No drainable fluid collection/abscess or pseudocyst identified. The spleen, adrenal glands appear unremarkable. Small bilateral renal hypodensities, likely cysts. There is no hydronephrosis on either side. The visualized ureters and urinary bladder appear unremarkable. The prostate and seminal vesicles are grossly unremarkable. There is gastrojejunostomy which appears patent. There is no  evidence of bowel obstruction or active inflammation. There is moderate stool throughout the colon. Normal appendix. There is mild aortoiliac atherosclerotic disease. The origins of the celiac axis, SMA, IMA as well as the origins of the renal arteries are patent. The SMV, and main portal vein appear patent. There is segmental narrowing of the splenic vein, likely related to mass effect and compression by the pancreatic edema. The splenic vein however remains patent. No portal venous gas identified. There is no adenopathy. The abdominal wall soft tissues appear unremarkable. There is degenerative changes of the spine. No acute fracture. IMPRESSION: Diffuse pancreatic edema with interval improvement compared to prior study. No peripancreatic collection, abscess or pseudocyst. Postsurgical changes of Whipple's procedure. No evidence of bowel obstruction. Focal subcapsular areas of lower attenuation along the undersurface of the left lobe of the liver are  not well characterized but may represent edema, contusion, or laceration. No hematoma or fluid collection. Electronically Signed   By: Anner Crete M.D.   On: 05/03/2016 00:57    EKG: (Independently reviewed) sinus tachycardia with ventricular rate 102 bpm, QTC 450 ms, no ST segment or T-wave changes that would be concerning for ischemia  Assessment/Plan Principal Problem:   Acute on chronic pancreatitis  -Patient presents with recurrent abdominal pain with nausea and vomiting likely precipitated by recent alcohol ingestion -CT scan shows improvement pancreatic and small bowel edema from previous episode of pancreatitis in June -Interestingly lipase is normal-?? Developing early calcification of remainder of pancreatitis -Bowel rest w/ consideration to upgrade to clear liquids later today if pain markedly improved -Start with IV morphine and would like to avoid IV Dilaudid -Generous IV fluid hydration (see below) -Suspect has reactive leukocytosis secondary to nausea and vomiting and dehydration  Active Problems:   Fatty liver -Secondary to ongoing alcohol use -Patient counseled that extensive fatty liver disease can be a precursor to the development of cirrhosis    Intractable nausea and vomiting   HTN (hypertension) -Currently uncontrolled in setting of ongoing abdominal pain -Continue preadmission Norvasc -If unable to tolerate PO medication will need to utilize IV hydralazine -Patient manifests clinical signs of dehydration as follows: Mild hyponatremia, mild metabolic acidemia with serum CO2 20 and a mild elevated anion gap of 18, elevated total bilirubin of 1.5, hemoconcentrated hemoglobin of 14.8 with baseline of 12.1, urine specific gravity 1.040 and acute starvation ketosis with ketones greater than 80 in the urine    GERD (gastroesophageal reflux disease) -Continue preadmission Protonix    Alcohol abuse -Patient denies daily alcohol use but clearly has correlation  between reported episodes of ingestion of alcohol with subsequent development of acute pancreatitis with abdominal pain nausea vomiting and occasionally diarrhea within 24 hours after ingestion of alcohol -CIWA -check UDS (will be obtained AFTER has received narcotics in ED) -Patient counseled extensively regarding absolute cessation of alcoholic beverages/products to prevent further episodes of pancreatitis    Protein-calorie malnutrition, severe -Patient reports significant weight loss since Whipple procedure in 2015 -Review of the weight graph in our EMR: June 2016 patient weighed 157 pounds and briefly at the early part of 2017 weight had increased to 160 pounds but has stabilized out to around 151 pounds -His BMI is 18.8 -Check pre-albumin -Nutrition consultation -Advance diet once abdominal pain resolved    Tobacco abuse -Patient counseled extensively on absolute cessation especially in setting of previous known pancreatic malignancy      DVT prophylaxis: Lovenox  Code Status: Full  Family Communication: No family at bedside Disposition  Plan: Anticipate discharge back to preadmission home environment once medically stable Consults called: None  Admission status: Observation/medical floor    ELLIS,ALLISON L. ANP-BC [facility] Pager [phone]   If 7PM-7AM, please contact night-coverage [email or web address] Password [facility]  05/03/2016, 7:48 AM

## 2016-05-03 NOTE — Progress Notes (Signed)
Initial Nutrition Assessment  DOCUMENTATION CODES:   Severe malnutrition in context of chronic illness  INTERVENTION:  - Diet advancement as medically feasible. - RD will order supplements and/or snacks per pt preference with diet advancement.  NUTRITION DIAGNOSIS:   Inadequate oral intake related to inability to eat as evidenced by NPO status.  GOAL:   Patient will meet greater than or equal to 90% of their needs  MONITOR:   Diet advancement, Weight trends, Labs, I & O's  REASON FOR ASSESSMENT:   Malnutrition Screening Tool, Consult  (significant weight loss since Whipple in 2015)  ASSESSMENT:   50 y.o. male with medical history significant for prior Whipple procedure for pancreatic mass in 2015, ongoing intermittent alcohol use and ongoing tobacco abuse, hypertension, severe protein calorie malnutrition, reflux, fatty liver disease. Patient has a history of frequent hospital admissions for exacerbation of pancreatitis manifesting as abdominal pain w/ nausea and vomiting. Patient was recently discharged on 6/13 after similar episode. At that time it was surmised that recent alcohol ingestion precipitated episode.  Pt seen for MST and consult. BMI and estimated nutrition needs based on weight from 03/06/16 as no new weight available. BMI from that date indicates normal weight. Pt has been NPO since admission with NP note stating diet to advance with resolution of abdominal pain. Pt reports N/V/abdominal pain began yesterday morning and that he had 2 episodes of emesis since transferring to the floor from ED. Pt states nausea and abdominal pain continue at this time and are worse with movement.   PTA pt did not limit/avoid any foods d/t Whipple procedure although he states he does not go to [employer]. Pt denies abdominal pain or nausea with any foods or beverages at home. He states he weighed 180 lbs prior to Whipple in 2015 and lowest weight after procedure was in the 130 lb range. Pt  unsure of UBW more recently. Chart review indicates stable weight on average (151-160 lbs) for the past 1 year; will monitor weight during admission.   Physical assessment of upper body shows moderate and severe muscle wasting, severe fat wasting. Did not assess legs per pt request.  Medications reviewed; 1 mg folic acid, 027 mg thiamine, daily multivitamin with minerals, 10 mg IV Reglan x1 dose this AM. Labs reviewed; Na: 134 mmol/L, Cl: 96 mmol/L, Alk Phos elevated, AST/ALT WDL. IVF: NS @ 125 mL/hr.    Diet Order:  Diet NPO time specified Except for: Sips with Meds  Skin:  Reviewed, no issues  Last BM:  PTA  Height:   Ht Readings from Last 1 Encounters:  03/06/16 '6\' 3"'$  (1.905 m)    Weight:   Wt Readings from Last 1 Encounters:  03/06/16 151 lb (68.5 kg)    Ideal Body Weight:  89.09 kg  BMI:  18.89 kg/m2  Estimated Nutritional Needs:   Kcal:  2050-2260 (30-33 kcal/kg)  Protein:  100-110 grams  Fluid:  >/= 2 L/day  EDUCATION NEEDS:   Education needs addressed    Jarome Matin, MS, RD, LDN Inpatient Clinical Dietitian Pager # [phone] After hours/weekend pager # [phone]

## 2016-05-03 NOTE — Progress Notes (Signed)
Call RN for report

## 2016-05-03 NOTE — Progress Notes (Signed)
Patient continues to have  Nausea and vomiting will not advance diet at this time

## 2016-05-04 DIAGNOSIS — R7881 Bacteremia: Secondary | ICD-10-CM

## 2016-05-04 DIAGNOSIS — E43 Unspecified severe protein-calorie malnutrition: Secondary | ICD-10-CM

## 2016-05-04 LAB — CBC
HCT: 34.9 % — ABNORMAL LOW (ref 39.0–52.0)
Hemoglobin: 11 g/dL — ABNORMAL LOW (ref 13.0–17.0)
MCH: 26.6 pg (ref 26.0–34.0)
MCHC: 31.5 g/dL (ref 30.0–36.0)
MCV: 84.5 fL (ref 78.0–100.0)
Platelets: 167 10*3/uL (ref 150–400)
RBC: 4.13 MIL/uL — ABNORMAL LOW (ref 4.22–5.81)
RDW: 15.1 % (ref 11.5–15.5)
WBC: 8.3 10*3/uL (ref 4.0–10.5)

## 2016-05-04 LAB — BLOOD CULTURE ID PANEL (REFLEXED)
Acinetobacter baumannii: NOT DETECTED
Candida albicans: NOT DETECTED
Candida glabrata: NOT DETECTED
Candida krusei: NOT DETECTED
Candida parapsilosis: NOT DETECTED
Candida tropicalis: NOT DETECTED
Carbapenem resistance: NOT DETECTED
Enterobacter cloacae complex: NOT DETECTED
Enterobacteriaceae species: DETECTED — AB
Enterococcus species: NOT DETECTED
Escherichia coli: NOT DETECTED
Haemophilus influenzae: NOT DETECTED
Klebsiella oxytoca: NOT DETECTED
Klebsiella pneumoniae: DETECTED — AB
Listeria monocytogenes: NOT DETECTED
Methicillin resistance: NOT DETECTED
Neisseria meningitidis: NOT DETECTED
Proteus species: NOT DETECTED
Pseudomonas aeruginosa: NOT DETECTED
Serratia marcescens: NOT DETECTED
Staphylococcus aureus (BCID): NOT DETECTED
Staphylococcus species: NOT DETECTED
Streptococcus agalactiae: NOT DETECTED
Streptococcus pneumoniae: NOT DETECTED
Streptococcus pyogenes: NOT DETECTED
Streptococcus species: NOT DETECTED
Vancomycin resistance: NOT DETECTED

## 2016-05-04 LAB — COMPREHENSIVE METABOLIC PANEL
ALT: 22 U/L (ref 17–63)
AST: 40 U/L (ref 15–41)
Albumin: 3.1 g/dL — ABNORMAL LOW (ref 3.5–5.0)
Alkaline Phosphatase: 106 U/L (ref 38–126)
Anion gap: 10 (ref 5–15)
BUN: <5 mg/dL — ABNORMAL LOW (ref 6–20)
CO2: 25 mmol/L (ref 22–32)
Calcium: 8.8 mg/dL — ABNORMAL LOW (ref 8.9–10.3)
Chloride: 98 mmol/L — ABNORMAL LOW (ref 101–111)
Creatinine, Ser: 0.78 mg/dL (ref 0.61–1.24)
GFR calc Af Amer: >60 mL/min (ref >60)
GFR calc non Af Amer: >60 mL/min (ref >60)
Glucose, Bld: 99 mg/dL (ref 65–99)
Potassium: 3.5 mmol/L (ref 3.5–5.1)
Sodium: 133 mmol/L — ABNORMAL LOW (ref 135–145)
Total Bilirubin: 1.4 mg/dL — ABNORMAL HIGH (ref 0.3–1.2)
Total Protein: 6 g/dL — ABNORMAL LOW (ref 6.5–8.1)

## 2016-05-04 MED ORDER — DEXTROSE 5 % IV SOLN
2.0000 g | INTRAVENOUS | Status: DC
  Administered 2016-05-04 – 2016-05-08 (×5): 2 g via INTRAVENOUS
  Filled 2016-05-04 (×5): qty 2

## 2016-05-04 NOTE — Progress Notes (Signed)
PROGRESS NOTE    Brent Rogers  [id number] DOB: Jan 23, 1966 DOA: 05/02/2016 PCP: Minerva Ends, MD   Outpatient Specialists:     Brief Narrative:  Brent Rogers is a 50 y.o. male with medical history significant for prior Whipple procedure for pancreatic mass in 2015, ongoing intermittent alcohol use and ongoing tobacco abuse, hypertension, severe protein calorie malnutrition, reflux, fatty liver disease. Patient has a history of frequent hospital admissions for exacerbation of pancreatitis manifesting as abdominal pain w/ nausea and vomiting. Patient was recently discharged on 6/13 after similar episode. Presented to ER with pain after ingesting alcohol. Found to have Klebsiella bacteremia.  IV abx started on 8/6.     Assessment & Plan:   Principal Problem:   Acute on chronic pancreatitis (HCC) Active Problems:   Protein-calorie malnutrition, severe (HCC)   Tobacco abuse   HTN (hypertension)   Fatty liver   GERD (gastroesophageal reflux disease)   Intractable nausea and vomiting   Alcohol abuse  Klebsiella bacteremia -IV rocephin -urine culture collected -repeat blood cultures in AM -? Related to immunosuppression due to alcohol abuse  Acute on chronic pancreatitis  -recent alcohol ingestion -CT scan shows improvement pancreatic and small bowel edema from previous episode of pancreatitis in June -Bowel rest w/ consideration to upgrade to clear liquids later today if pain markedly improved-- accidentally brought full diet tray and he ate it with an increase in his pain -Start with IV morphine -Generous IV fluid hydration (see below)    Fatty liver -Secondary to ongoing alcohol use -Patient counseled that extensive fatty liver disease can be a precursor to the development of cirrhosis -no sign of ascites    GERD (gastroesophageal reflux disease) -Protonix    Alcohol abuse -Patient denies daily alcohol  -CIWA -Patient counseled extensively regarding  absolute cessation of alcoholic beverages/products to prevent further episodes of pancreatitis- patient states he is 90% ready to quit    Protein-calorie malnutrition, severe -Patient reports significant weight loss since Whipple procedure in 2015 -pre-albumin 12.2 -Nutrition consultation -Advance diet once abdominal pain resolved -HIV negative 1/17    Tobacco abuse -encouraged cessation      DVT prophylaxis:  Lovenox   Code Status: Full Code   Family Communication: patient  Disposition Plan:     Consultants:     Procedures:         Subjective: No SOB, no CP Ate a regular tray for breakfast-- increase in his abd pain  Objective: Vitals:   05/03/16 1007 05/03/16 1334 05/03/16 2142 05/04/16 0548  BP: 134/90 (!) 135/91 120/81 115/76  Pulse: 71 92 81 88  Resp: 18 16 19 16   Temp: 97.6 F (36.4 C) 99 F (37.2 C) 99.1 F (37.3 C) 99.4 F (37.4 C)  TempSrc: Oral Oral Oral Oral  SpO2: 100% 97% 99% 98%  Weight: 67.8 kg (149 lb 6.4 oz)     Height: 6\' 3"  (1.905 m)       Intake/Output Summary (Last 24 hours) at 05/04/16 1151 Last data filed at 05/04/16 1017  Gross per 24 hour  Intake             1200 ml  Output             1400 ml  Net             -200 ml   Filed Weights   05/03/16 1007  Weight: 67.8 kg (149 lb 6.4 oz)    Examination:  General exam: Appears calm and  comfortable  Respiratory system: Clear to auscultation. Respiratory effort normal. Cardiovascular system: S1 & S2 heard, RRR. No JVD, murmurs, rubs, gallops or clicks. No pedal edema. Gastrointestinal system: Abdomen is thin, nondistended, soft and nontender. No organomegaly or masses felt. Normal bowel sounds heard. Central nervous system: Alert and oriented. No focal neurological deficits.     Data Reviewed: I have personally reviewed following labs and imaging studies  CBC:  Recent Labs Lab 05/02/16 2142 05/04/16 0651  WBC 14.3* 8.3  NEUTROABS 11.7*  --   HGB 14.8  11.0*  HCT 44.2 34.9*  MCV 84.2 84.5  PLT 270 [id number]   Basic Metabolic Panel:  Recent Labs Lab 05/02/16 2142 05/03/16 1337 05/04/16 0651  NA 134*  --  133*  K 4.9  --  3.5  CL 96*  --  98*  CO2 20*  --  25  GLUCOSE 91  --  99  BUN 9  --  <5*  CREATININE 1.00  --  0.78  CALCIUM 9.8  --  8.8*  MG  --  1.6*  --   PHOS  --  2.0*  --    GFR: Estimated Creatinine Clearance: 105.9 mL/min (by C-G formula based on SCr of 0.8 mg/dL). Liver Function Tests:  Recent Labs Lab 05/02/16 2142 05/04/16 0651  AST 28 40  ALT 24 22  ALKPHOS 156* 106  BILITOT 1.5* 1.4*  PROT 7.9 6.0*  ALBUMIN 4.4 3.1*    Recent Labs Lab 05/02/16 2142  LIPASE 25   No results for input(s): AMMONIA in the last 168 hours. Coagulation Profile: No results for input(s): INR, PROTIME in the last 168 hours. Cardiac Enzymes: No results for input(s): CKTOTAL, CKMB, CKMBINDEX, TROPONINI in the last 168 hours. BNP (last 3 results) No results for input(s): PROBNP in the last 8760 hours. HbA1C: No results for input(s): HGBA1C in the last 72 hours. CBG: No results for input(s): GLUCAP in the last 168 hours. Lipid Profile: No results for input(s): CHOL, HDL, LDLCALC, TRIG, CHOLHDL, LDLDIRECT in the last 72 hours. Thyroid Function Tests: No results for input(s): TSH, T4TOTAL, FREET4, T3FREE, THYROIDAB in the last 72 hours. Anemia Panel: No results for input(s): VITAMINB12, FOLATE, FERRITIN, TIBC, IRON, RETICCTPCT in the last 72 hours. Urine analysis:    Component Value Date/Time   COLORURINE YELLOW 05/03/2016 0607   APPEARANCEUR CLEAR 05/03/2016 0607   LABSPEC 1.040 (H) 05/03/2016 0607   PHURINE 5.0 05/03/2016 0607   GLUCOSEU NEGATIVE 05/03/2016 0607   HGBUR NEGATIVE 05/03/2016 0607   BILIRUBINUR NEGATIVE 05/03/2016 0607   KETONESUR >80 (A) 05/03/2016 0607   PROTEINUR NEGATIVE 05/03/2016 0607   UROBILINOGEN 1.0 06/23/2015 1513   NITRITE NEGATIVE 05/03/2016 0607   LEUKOCYTESUR NEGATIVE 05/03/2016 [id number]        Recent Results (from the past 240 hour(s))  Culture, blood (Routine X 2) w Reflex to ID Panel     Status: None (Preliminary result)   Collection Time: 05/03/16  5:11 AM  Result Value Ref Range Status   Specimen Description BLOOD RIGHT ANTECUBITAL  Final   Special Requests BOTTLES DRAWN AEROBIC ONLY 5CC  Final   Culture  Setup Time   Final    GRAM NEGATIVE RODS AEROBIC BOTTLE ONLY CRITICAL RESULT CALLED TO, READ BACK BY AND VERIFIED WITH: J. LEDFORD, PHARMD AT 0745 ON 05/04/16 BY C. JESSUP, MLT.    Culture GRAM NEGATIVE RODS  Final   Report Status PENDING  Incomplete  Culture, blood (Routine X 2) w Reflex to ID  Panel     Status: None (Preliminary result)   Collection Time: 05/03/16  6:15 AM  Result Value Ref Range Status   Specimen Description BLOOD LEFT ANTECUBITAL  Final   Special Requests BOTTLES DRAWN AEROBIC ONLY 5CC  Final   Culture  Setup Time   Final    AEROBIC BOTTLE ONLY GRAM NEGATIVE RODS CRITICAL RESULT CALLED TO, READ BACK BY AND VERIFIED WITH: J. LEDFORD, PHARMD AT 0745 ON 05/04/16 BY C. JESSUP, MLT.    Culture GRAM NEGATIVE RODS  Final   Report Status PENDING  Incomplete  Blood Culture ID Panel (Reflexed)     Status: Abnormal   Collection Time: 05/03/16  6:15 AM  Result Value Ref Range Status   Enterococcus species NOT DETECTED NOT DETECTED Final   Vancomycin resistance NOT DETECTED NOT DETECTED Final   Listeria monocytogenes NOT DETECTED NOT DETECTED Final   Staphylococcus species NOT DETECTED NOT DETECTED Final   Staphylococcus aureus NOT DETECTED NOT DETECTED Final   Methicillin resistance NOT DETECTED NOT DETECTED Final   Streptococcus species NOT DETECTED NOT DETECTED Final   Streptococcus agalactiae NOT DETECTED NOT DETECTED Final   Streptococcus pneumoniae NOT DETECTED NOT DETECTED Final   Streptococcus pyogenes NOT DETECTED NOT DETECTED Final   Acinetobacter baumannii NOT DETECTED NOT DETECTED Final   Enterobacteriaceae species DETECTED (A) NOT  DETECTED Final    Comment: CRITICAL RESULT CALLED TO, READ BACK BY AND VERIFIED WITH: J. LEDFORD, PHARMD AT 0745 ON 05/04/16 BY C. JESSUP, MLT.    Enterobacter cloacae complex NOT DETECTED NOT DETECTED Final   Escherichia coli NOT DETECTED NOT DETECTED Final   Klebsiella oxytoca NOT DETECTED NOT DETECTED Final   Klebsiella pneumoniae DETECTED (A) NOT DETECTED Final    Comment: CRITICAL RESULT CALLED TO, READ BACK BY AND VERIFIED WITH: J. LEDFORD, PHARMD AT 0745 ON 05/04/16 BY C. JESSUP, MLT.    Proteus species NOT DETECTED NOT DETECTED Final   Serratia marcescens NOT DETECTED NOT DETECTED Final   Carbapenem resistance NOT DETECTED NOT DETECTED Final   Haemophilus influenzae NOT DETECTED NOT DETECTED Final   Neisseria meningitidis NOT DETECTED NOT DETECTED Final   Pseudomonas aeruginosa NOT DETECTED NOT DETECTED Final   Candida albicans NOT DETECTED NOT DETECTED Final   Candida glabrata NOT DETECTED NOT DETECTED Final   Candida krusei NOT DETECTED NOT DETECTED Final   Candida parapsilosis NOT DETECTED NOT DETECTED Final   Candida tropicalis NOT DETECTED NOT DETECTED Final      Anti-infectives    Start     Dose/Rate Route Frequency Ordered Stop   05/04/16 0900  cefTRIAXone (ROCEPHIN) 2 g in dextrose 5 % 50 mL IVPB     2 g 100 mL/hr over 30 Minutes Intravenous Every 24 hours 05/04/16 0752         Radiology Studies: Ct Abdomen Pelvis W Contrast  Result Date: 05/03/2016 CLINICAL DATA:  50 year old male with pancreatitis. Follow-up scan. History of Whipple's procedure. EXAM: CT ABDOMEN AND PELVIS WITH CONTRAST TECHNIQUE: Multidetector CT imaging of the abdomen and pelvis was performed using the standard protocol following bolus administration of intravenous contrast. CONTRAST:  1 ISOVUE-300 IOPAMIDOL (ISOVUE-300) INJECTION 61% COMPARISON:  CT dated 03/07/2016 FINDINGS: O the visualized lung bases are clear. No intra-abdominal free air or free fluid. There is mild apparent diffuse fatty  infiltration of the liver. Small subcapsular areas of hypodensity involving the inferior surface of the left lobe of the liver are not well characterized but may represent focal areas of subcapsular edema,  small hepatic contusions or laceration. There is no hematoma or fluid collection. No contrast extravasation. Cholecystectomy. There is postsurgical changes of the elbow with resection of the head of the pancreas. There has been interval improvement of the previously seen inflamed appearance of the pancreas with residual inflammatory changes in the peripancreatic fat. No drainable fluid collection/abscess or pseudocyst identified. The spleen, adrenal glands appear unremarkable. Small bilateral renal hypodensities, likely cysts. There is no hydronephrosis on either side. The visualized ureters and urinary bladder appear unremarkable. The prostate and seminal vesicles are grossly unremarkable. There is gastrojejunostomy which appears patent. There is no evidence of bowel obstruction or active inflammation. There is moderate stool throughout the colon. Normal appendix. There is mild aortoiliac atherosclerotic disease. The origins of the celiac axis, SMA, IMA as well as the origins of the renal arteries are patent. The SMV, and main portal vein appear patent. There is segmental narrowing of the splenic vein, likely related to mass effect and compression by the pancreatic edema. The splenic vein however remains patent. No portal venous gas identified. There is no adenopathy. The abdominal wall soft tissues appear unremarkable. There is degenerative changes of the spine. No acute fracture. IMPRESSION: Diffuse pancreatic edema with interval improvement compared to prior study. No peripancreatic collection, abscess or pseudocyst. Postsurgical changes of Whipple's procedure. No evidence of bowel obstruction. Focal subcapsular areas of lower attenuation along the undersurface of the left lobe of the liver are not well  characterized but may represent edema, contusion, or laceration. No hematoma or fluid collection. Electronically Signed   By: Anner Crete M.D.   On: 05/03/2016 00:57        Scheduled Meds: . amLODipine  5 mg Oral Daily  . cefTRIAXone (ROCEPHIN)  IV  2 g Intravenous Q24H  . enoxaparin (LOVENOX) injection  40 mg Subcutaneous Q24H  . folic acid  1 mg Oral Daily  . LORazepam  0-4 mg Intravenous Q6H   Followed by  . [START ON 05/05/2016] LORazepam  0-4 mg Intravenous Q12H  . multivitamin with minerals  1 tablet Oral Daily  . pantoprazole  40 mg Oral Daily  . thiamine  100 mg Oral Daily   Or  . thiamine  100 mg Intravenous Daily   Continuous Infusions: . sodium chloride 125 mL/hr at 05/04/16 0410     LOS: 0 days    Time spent: 25 min    [place], DO [facility] Pager [phone]  If 7PM-7AM, please contact night-coverage [email or web address] Password [email or web address] 05/04/2016, 11:51 AM

## 2016-05-04 NOTE — Progress Notes (Signed)
PHARMACY - PHYSICIAN COMMUNICATION CRITICAL VALUE ALERT - BLOOD CULTURE IDENTIFICATION (BCID)  Results for orders placed or performed during the hospital encounter of 05/02/16  Blood Culture ID Panel (Reflexed) (Collected: 05/03/2016  6:15 AM)  Result Value Ref Range   Enterococcus species NOT DETECTED NOT DETECTED   Vancomycin resistance NOT DETECTED NOT DETECTED   Listeria monocytogenes NOT DETECTED NOT DETECTED   Staphylococcus species NOT DETECTED NOT DETECTED   Staphylococcus aureus NOT DETECTED NOT DETECTED   Methicillin resistance NOT DETECTED NOT DETECTED   Streptococcus species NOT DETECTED NOT DETECTED   Streptococcus agalactiae NOT DETECTED NOT DETECTED   Streptococcus pneumoniae NOT DETECTED NOT DETECTED   Streptococcus pyogenes NOT DETECTED NOT DETECTED   Acinetobacter baumannii NOT DETECTED NOT DETECTED   Enterobacteriaceae species DETECTED (A) NOT DETECTED   Enterobacter cloacae complex NOT DETECTED NOT DETECTED   Escherichia coli NOT DETECTED NOT DETECTED   Klebsiella oxytoca NOT DETECTED NOT DETECTED   Klebsiella pneumoniae DETECTED (A) NOT DETECTED   Proteus species NOT DETECTED NOT DETECTED   Serratia marcescens NOT DETECTED NOT DETECTED   Carbapenem resistance NOT DETECTED NOT DETECTED   Haemophilus influenzae NOT DETECTED NOT DETECTED   Neisseria meningitidis NOT DETECTED NOT DETECTED   Pseudomonas aeruginosa NOT DETECTED NOT DETECTED   Candida albicans NOT DETECTED NOT DETECTED   Candida glabrata NOT DETECTED NOT DETECTED   Candida krusei NOT DETECTED NOT DETECTED   Candida parapsilosis NOT DETECTED NOT DETECTED   Candida tropicalis NOT DETECTED NOT DETECTED    Name of physician (or Provider) Contacted: Erin Hearing, NP ([facility])  Changes to prescribed antibiotics required: Start Ceftriaxone 2g IV q24h  Narda Bonds 05/04/2016  7:53 AM

## 2016-05-05 DIAGNOSIS — K760 Fatty (change of) liver, not elsewhere classified: Secondary | ICD-10-CM

## 2016-05-05 DIAGNOSIS — K219 Gastro-esophageal reflux disease without esophagitis: Secondary | ICD-10-CM

## 2016-05-05 LAB — CBC
HCT: 35.7 % — ABNORMAL LOW (ref 39.0–52.0)
Hemoglobin: 11.6 g/dL — ABNORMAL LOW (ref 13.0–17.0)
MCH: 27.1 pg (ref 26.0–34.0)
MCHC: 32.5 g/dL (ref 30.0–36.0)
MCV: 83.4 fL (ref 78.0–100.0)
Platelets: 152 10*3/uL (ref 150–400)
RBC: 4.28 MIL/uL (ref 4.22–5.81)
RDW: 14.7 % (ref 11.5–15.5)
WBC: 6.3 10*3/uL (ref 4.0–10.5)

## 2016-05-05 LAB — BASIC METABOLIC PANEL
Anion gap: 7 (ref 5–15)
BUN: <5 mg/dL — ABNORMAL LOW (ref 6–20)
CO2: 28 mmol/L (ref 22–32)
Calcium: 8.9 mg/dL (ref 8.9–10.3)
Chloride: 99 mmol/L — ABNORMAL LOW (ref 101–111)
Creatinine, Ser: 0.76 mg/dL (ref 0.61–1.24)
GFR calc Af Amer: >60 mL/min (ref >60)
GFR calc non Af Amer: >60 mL/min (ref >60)
Glucose, Bld: 114 mg/dL — ABNORMAL HIGH (ref 65–99)
Potassium: 3.3 mmol/L — ABNORMAL LOW (ref 3.5–5.1)
Sodium: 134 mmol/L — ABNORMAL LOW (ref 135–145)

## 2016-05-05 LAB — COMPREHENSIVE METABOLIC PANEL
ALT: 30 U/L (ref 17–63)
AST: 46 U/L — ABNORMAL HIGH (ref 15–41)
Albumin: 2.9 g/dL — ABNORMAL LOW (ref 3.5–5.0)
Alkaline Phosphatase: 105 U/L (ref 38–126)
Anion gap: 8 (ref 5–15)
BUN: <5 mg/dL — ABNORMAL LOW (ref 6–20)
CO2: 26 mmol/L (ref 22–32)
Calcium: 8.8 mg/dL — ABNORMAL LOW (ref 8.9–10.3)
Chloride: 98 mmol/L — ABNORMAL LOW (ref 101–111)
Creatinine, Ser: 0.64 mg/dL (ref 0.61–1.24)
GFR calc Af Amer: >60 mL/min (ref >60)
GFR calc non Af Amer: >60 mL/min (ref >60)
Glucose, Bld: 119 mg/dL — ABNORMAL HIGH (ref 65–99)
Potassium: 3.1 mmol/L — ABNORMAL LOW (ref 3.5–5.1)
Sodium: 132 mmol/L — ABNORMAL LOW (ref 135–145)
Total Bilirubin: 0.8 mg/dL (ref 0.3–1.2)
Total Protein: 5.6 g/dL — ABNORMAL LOW (ref 6.5–8.1)

## 2016-05-05 LAB — URINE CULTURE: Culture: NO GROWTH

## 2016-05-05 NOTE — Progress Notes (Signed)
Lab called that blood sample drawn for CBC WASN'T ENOUGH for the test, another order being put in by pt attending physcian, Lab will be here at scheduled time to draw another sample, will pass massage on to the in coming nurse that will take over from me

## 2016-05-05 NOTE — Progress Notes (Signed)
[facility]                                                                              Patient Demographics  Brent Rogers, is a 50 y.o. male, DOB - 1966-06-04, ZOX:[id number]  Admit date - 05/02/2016   Admitting Physician Courage Mariea Clonts, MD  Outpatient Primary MD for the patient is Lora Paula, MD  Outpatient specialists:   LOS - 1  days    Chief Complaint  Patient presents with  . Chest Pain  . Abdominal Pain       Brief summary   Brent Klausen Fosteris a 50 y.o.malewith medical history significant for prior Whipple procedure for pancreatic mass in 2015, ongoing intermittent alcohol use and ongoing tobacco abuse, hypertension, severe protein calorie malnutrition, reflux, fatty liver disease. Patient has a history of frequent hospital admissions for exacerbation of pancreatitis manifesting as abdominal pain w/nausea and vomiting. Patient was recently discharged on 6/13 after similar episode. Presented to ER with pain after ingesting alcohol. Found to have Klebsiella bacteremia.  IV abx started on 8/6.     Assessment & Plan    Klebsiella bacteremia -Continue IV Rocephin, follow blood cultures for sensitivity   Acute on chronic pancreatitis  -recent alcohol ingestion -CT scan shows improvement pancreatic and small bowel edema from previous episode of pancreatitis in June -Advance to full liquid diet, continue IV pain control and IV fluids, does not like clear liquids  Fatty liver -Secondary to ongoing alcohol use -Patient counseled that extensive fatty liver disease can be a precursor to the development of cirrhosis -no sign of ascites  GERD (gastroesophageal reflux disease) -Protonix  Alcohol abuse -Patient denies daily alcohol  -Continue CIWA with Ativan -Patient counseled extensively regarding absolute cessation of alcoholicbeverages/productsto prevent further episodes of pancreatitis- patient states he is 90% ready to  quit  Protein-calorie malnutrition, severe -Patient reports significant weight loss since Whipple procedure in 2015, pre-albumin 12.2 -Nutrition consultation -HIV negative 1/17  Tobacco abuse -encouraged cessation  Code Status: *FC DVT Prophylaxis:  Lovenox Family Communication: Discussed in detail with the patient, all imaging results, lab results explained to the patient    Disposition Plan: Hopefully next 24-48 hours  Time Spent in minutes   25 minutes  Procedures:  None  Consultants  None  Antimicrobials:      Medications  Scheduled Meds: . amLODipine  5 mg Oral Daily  . cefTRIAXone (ROCEPHIN)  IV  2 g Intravenous Q24H  . enoxaparin (LOVENOX) injection  40 mg Subcutaneous Q24H  . folic acid  1 mg Oral Daily  . LORazepam  0-4 mg Intravenous Q12H  . multivitamin with minerals  1 tablet Oral Daily  . pantoprazole  40 mg Oral Daily  . thiamine  100 mg Oral Daily   Or  . thiamine  100 mg Intravenous Daily   Continuous Infusions: . sodium chloride 125 mL/hr at 05/05/16 0947   PRN Meds:.acetaminophen **OR** acetaminophen, LORazepam **OR** LORazepam, morphine injection, ondansetron (ZOFRAN) IV **OR** promethazine   Antibiotics   Anti-infectives    Start     Dose/Rate Route Frequency Ordered Stop   05/04/16 0900  cefTRIAXone (ROCEPHIN)  2 g in dextrose 5 % 50 mL IVPB     2 g 100 mL/hr over 30 Minutes Intravenous Every 24 hours 05/04/16 1610          Subjective:   [name] was seen and examined today. States he does not like clear liquid diet help wound show advanced to full liquid. Abdominal Pain is improving. No fevers or chills.  Patient denies dizziness, chest pain, shortness of breath,  new weakness, numbess, tingling. No acute events overnight.    Objective:   Vitals:   05/04/16 0548 05/04/16 1522 05/04/16 2108 05/05/16 0605  BP: 115/76 (!) 142/89 134/89 114/86  Pulse: 88 88 85 72  Resp: 16 15 16 16   Temp: 99.4 F (37.4 C) 98.5 F  (36.9 C) 99.3 F (37.4 C) 98.2 F (36.8 C)  TempSrc: Oral Oral Oral Oral  SpO2: 98% 100% 98% 99%  Weight:      Height:        Intake/Output Summary (Last 24 hours) at 05/05/16 1115 Last data filed at 05/05/16 1059  Gross per 24 hour  Intake          3504.08 ml  Output             1440 ml  Net          2064.08 ml     Wt Readings from Last 3 Encounters:  05/03/16 67.8 kg (149 lb 6.4 oz)  03/06/16 68.5 kg (151 lb)  12/25/15 68.5 kg (151 lb)     Exam  General: Alert and oriented x 3, NAD  HEENT:  PERRLA, EOMI, Anicteric Sclera, mucous membranes moist.   Neck: Supple, no JVD, no masses  Cardiovascular: S1 S2 auscultated, no rubs, murmurs or gallops. Regular rate and rhythm.  Respiratory: Clear to auscultation bilaterally, no wheezing, rales or rhonchi  Gastrointestinal: Soft, no significant tenderness to deep palpation , nondistended, + bowel sounds  Ext: no cyanosis clubbing or edema  Neuro: AAOx3, Cr N's II- XII. Strength 5/5 upper and lower extremities bilaterally  Skin: No rashes  Psych: Normal affect and demeanor, alert and oriented x3    Data Reviewed:  I have personally reviewed following labs and imaging studies  Micro Results Recent Results (from the past 240 hour(s))  Culture, blood (Routine X 2) w Reflex to ID Panel     Status: None (Preliminary result)   Collection Time: 05/03/16  5:11 AM  Result Value Ref Range Status   Specimen Description BLOOD RIGHT ANTECUBITAL  Final   Special Requests BOTTLES DRAWN AEROBIC ONLY 5CC  Final   Culture  Setup Time   Final    GRAM NEGATIVE RODS AEROBIC BOTTLE ONLY CRITICAL RESULT CALLED TO, READ BACK BY AND VERIFIED WITH: J. LEDFORD, PHARMD AT 0745 ON 05/04/16 BY C. JESSUP, MLT.    Culture GRAM NEGATIVE RODS  Final   Report Status PENDING  Incomplete  Culture, blood (Routine X 2) w Reflex to ID Panel     Status: None (Preliminary result)   Collection Time: 05/03/16  6:15 AM  Result Value Ref Range Status    Specimen Description BLOOD LEFT ANTECUBITAL  Final   Special Requests BOTTLES DRAWN AEROBIC ONLY 5CC  Final   Culture  Setup Time   Final    AEROBIC BOTTLE ONLY GRAM NEGATIVE RODS CRITICAL RESULT CALLED TO, READ BACK BY AND VERIFIED WITH: J. LEDFORD, PHARMD AT 0745 ON 05/04/16 BY C. JESSUP, MLT.    Culture GRAM NEGATIVE RODS  Final  Report Status PENDING  Incomplete  Blood Culture ID Panel (Reflexed)     Status: Abnormal   Collection Time: 05/03/16  6:15 AM  Result Value Ref Range Status   Enterococcus species NOT DETECTED NOT DETECTED Final   Vancomycin resistance NOT DETECTED NOT DETECTED Final   Listeria monocytogenes NOT DETECTED NOT DETECTED Final   Staphylococcus species NOT DETECTED NOT DETECTED Final   Staphylococcus aureus NOT DETECTED NOT DETECTED Final   Methicillin resistance NOT DETECTED NOT DETECTED Final   Streptococcus species NOT DETECTED NOT DETECTED Final   Streptococcus agalactiae NOT DETECTED NOT DETECTED Final   Streptococcus pneumoniae NOT DETECTED NOT DETECTED Final   Streptococcus pyogenes NOT DETECTED NOT DETECTED Final   Acinetobacter baumannii NOT DETECTED NOT DETECTED Final   Enterobacteriaceae species DETECTED (A) NOT DETECTED Final    Comment: CRITICAL RESULT CALLED TO, READ BACK BY AND VERIFIED WITH: J. LEDFORD, PHARMD AT 0745 ON 05/04/16 BY C. JESSUP, MLT.    Enterobacter cloacae complex NOT DETECTED NOT DETECTED Final   Escherichia coli NOT DETECTED NOT DETECTED Final   Klebsiella oxytoca NOT DETECTED NOT DETECTED Final   Klebsiella pneumoniae DETECTED (A) NOT DETECTED Final    Comment: CRITICAL RESULT CALLED TO, READ BACK BY AND VERIFIED WITH: J. LEDFORD, PHARMD AT 0745 ON 05/04/16 BY C. JESSUP, MLT.    Proteus species NOT DETECTED NOT DETECTED Final   Serratia marcescens NOT DETECTED NOT DETECTED Final   Carbapenem resistance NOT DETECTED NOT DETECTED Final   Haemophilus influenzae NOT DETECTED NOT DETECTED Final   Neisseria meningitidis NOT  DETECTED NOT DETECTED Final   Pseudomonas aeruginosa NOT DETECTED NOT DETECTED Final   Candida albicans NOT DETECTED NOT DETECTED Final   Candida glabrata NOT DETECTED NOT DETECTED Final   Candida krusei NOT DETECTED NOT DETECTED Final   Candida parapsilosis NOT DETECTED NOT DETECTED Final   Candida tropicalis NOT DETECTED NOT DETECTED Final    Radiology Reports Ct Abdomen Pelvis W Contrast  Result Date: 05/03/2016 CLINICAL DATA:  50 year old male with pancreatitis. Follow-up scan. History of Whipple's procedure. EXAM: CT ABDOMEN AND PELVIS WITH CONTRAST TECHNIQUE: Multidetector CT imaging of the abdomen and pelvis was performed using the standard protocol following bolus administration of intravenous contrast. CONTRAST:  1 ISOVUE-300 IOPAMIDOL (ISOVUE-300) INJECTION 61% COMPARISON:  CT dated 03/07/2016 FINDINGS: O the visualized lung bases are clear. No intra-abdominal free air or free fluid. There is mild apparent diffuse fatty infiltration of the liver. Small subcapsular areas of hypodensity involving the inferior surface of the left lobe of the liver are not well characterized but may represent focal areas of subcapsular edema, small hepatic contusions or laceration. There is no hematoma or fluid collection. No contrast extravasation. Cholecystectomy. There is postsurgical changes of the elbow with resection of the head of the pancreas. There has been interval improvement of the previously seen inflamed appearance of the pancreas with residual inflammatory changes in the peripancreatic fat. No drainable fluid collection/abscess or pseudocyst identified. The spleen, adrenal glands appear unremarkable. Small bilateral renal hypodensities, likely cysts. There is no hydronephrosis on either side. The visualized ureters and urinary bladder appear unremarkable. The prostate and seminal vesicles are grossly unremarkable. There is gastrojejunostomy which appears patent. There is no evidence of bowel  obstruction or active inflammation. There is moderate stool throughout the colon. Normal appendix. There is mild aortoiliac atherosclerotic disease. The origins of the celiac axis, SMA, IMA as well as the origins of the renal arteries are patent. The SMV, and main portal  vein appear patent. There is segmental narrowing of the splenic vein, likely related to mass effect and compression by the pancreatic edema. The splenic vein however remains patent. No portal venous gas identified. There is no adenopathy. The abdominal wall soft tissues appear unremarkable. There is degenerative changes of the spine. No acute fracture. IMPRESSION: Diffuse pancreatic edema with interval improvement compared to prior study. No peripancreatic collection, abscess or pseudocyst. Postsurgical changes of Whipple's procedure. No evidence of bowel obstruction. Focal subcapsular areas of lower attenuation along the undersurface of the left lobe of the liver are not well characterized but may represent edema, contusion, or laceration. No hematoma or fluid collection. Electronically Signed   By: Elgie Collard M.D.   On: 05/03/2016 00:57    Lab Data:  CBC:  Recent Labs Lab 05/02/16 2142 05/04/16 0651 05/05/16 0724  WBC 14.3* 8.3 6.3  NEUTROABS 11.7*  --   --   HGB 14.8 11.0* 11.6*  HCT 44.2 34.9* 35.7*  MCV 84.2 84.5 83.4  PLT 270 167 152   Basic Metabolic Panel:  Recent Labs Lab 05/02/16 2142 05/03/16 1337 05/04/16 0651 05/05/16 0539 05/05/16 0724  NA 134*  --  133* 134* 132*  K 4.9  --  3.5 3.3* 3.1*  CL 96*  --  98* 99* 98*  CO2 20*  --  25 28 26   GLUCOSE 91  --  99 114* 119*  BUN 9  --  <5* <5* <5*  CREATININE 1.00  --  0.78 0.76 0.64  CALCIUM 9.8  --  8.8* 8.9 8.8*  MG  --  1.6*  --   --   --   PHOS  --  2.0*  --   --   --    GFR: Estimated Creatinine Clearance: 105.9 mL/min (by C-G formula based on SCr of 0.8 mg/dL). Liver Function Tests:  Recent Labs Lab 05/02/16 2142 05/04/16 0651  05/05/16 0724  AST 28 40 46*  ALT 24 22 30   ALKPHOS 156* 106 105  BILITOT 1.5* 1.4* 0.8  PROT 7.9 6.0* 5.6*  ALBUMIN 4.4 3.1* 2.9*    Recent Labs Lab 05/02/16 2142  LIPASE 25   No results for input(s): AMMONIA in the last 168 hours. Coagulation Profile: No results for input(s): INR, PROTIME in the last 168 hours. Cardiac Enzymes: No results for input(s): CKTOTAL, CKMB, CKMBINDEX, TROPONINI in the last 168 hours. BNP (last 3 results) No results for input(s): PROBNP in the last 8760 hours. HbA1C: No results for input(s): HGBA1C in the last 72 hours. CBG: No results for input(s): GLUCAP in the last 168 hours. Lipid Profile: No results for input(s): CHOL, HDL, LDLCALC, TRIG, CHOLHDL, LDLDIRECT in the last 72 hours. Thyroid Function Tests: No results for input(s): TSH, T4TOTAL, FREET4, T3FREE, THYROIDAB in the last 72 hours. Anemia Panel: No results for input(s): VITAMINB12, FOLATE, FERRITIN, TIBC, IRON, RETICCTPCT in the last 72 hours. Urine analysis:    Component Value Date/Time   COLORURINE YELLOW 05/03/2016 0607   APPEARANCEUR CLEAR 05/03/2016 0607   LABSPEC 1.040 (H) 05/03/2016 0607   PHURINE 5.0 05/03/2016 0607   GLUCOSEU NEGATIVE 05/03/2016 0607   HGBUR NEGATIVE 05/03/2016 0607   BILIRUBINUR NEGATIVE 05/03/2016 0607   KETONESUR >80 (A) 05/03/2016 0607   PROTEINUR NEGATIVE 05/03/2016 0607   UROBILINOGEN 1.0 06/23/2015 1513   NITRITE NEGATIVE 05/03/2016 0607   LEUKOCYTESUR NEGATIVE 05/03/2016 1610     Anjenette Gerbino M.D. [facility] 05/05/2016, 11:15 AM  Pager: [phone] Between 7am to 7pm - call  Pager - [phone]  After 7pm go to [email or web address] - password [email or web address]  Call night coverage person covering after 7pm

## 2016-05-06 ENCOUNTER — Ambulatory Visit: Payer: Self-pay | Admitting: Family Medicine

## 2016-05-06 LAB — CULTURE, BLOOD (ROUTINE X 2)

## 2016-05-06 MED ORDER — POTASSIUM CHLORIDE CRYS ER 20 MEQ PO TBCR
40.0000 meq | EXTENDED_RELEASE_TABLET | Freq: Once | ORAL | Status: AC
  Administered 2016-05-06: 40 meq via ORAL
  Filled 2016-05-06: qty 2

## 2016-05-06 MED ORDER — KETOROLAC TROMETHAMINE 15 MG/ML IJ SOLN
15.0000 mg | Freq: Four times a day (QID) | INTRAMUSCULAR | Status: DC | PRN
  Administered 2016-05-06 – 2016-05-07 (×3): 15 mg via INTRAVENOUS
  Filled 2016-05-06 (×3): qty 1

## 2016-05-06 MED ORDER — HYDROCODONE-ACETAMINOPHEN 5-325 MG PO TABS
1.0000 | ORAL_TABLET | ORAL | Status: DC | PRN
  Administered 2016-05-06 – 2016-05-09 (×8): 2 via ORAL
  Filled 2016-05-06 (×9): qty 2

## 2016-05-06 NOTE — Progress Notes (Signed)
[facility]                                                                              Patient Demographics  Brent Rogers, is a 50 y.o. male, DOB - [birth year], UYQ:[id number]  Admit date - 05/02/2016   Admitting Physician Courage Mariea Clonts, MD  Outpatient Primary MD for the patient is Lora Paula, MD  Outpatient specialists:   LOS - 2  days    Chief Complaint  Patient presents with  . Chest Pain  . Abdominal Pain       Brief summary   Brent Brunsvold Fosteris a 50 y.o.malewith medical history significant for prior Whipple procedure for pancreatic mass in 2015, ongoing intermittent alcohol use and ongoing tobacco abuse, hypertension, severe protein calorie malnutrition, reflux, fatty liver disease. Patient has a history of frequent hospital admissions for exacerbation of pancreatitis manifesting as abdominal pain w/nausea and vomiting. Patient was recently discharged on 6/13 after similar episode. Presented to ER with pain after ingesting alcohol. Found to have Klebsiella bacteremia.  IV abx started on 8/6.     Assessment & Plan    Klebsiella bacteremia -Continue IV Rocephin  Acute on chronic pancreatitis  -recent alcohol ingestion -CT scan shows improvement pancreatic and small bowel edema from previous episode of pancreatitis in June - Per patient, he was not able to tolerate full liquid diet, change back to NPO status, pain control and IV fluids  Fatty liver -Secondary to ongoing alcohol use -Patient counseled that extensive fatty liver disease can be a precursor to the development of cirrhosis -no sign of ascites  GERD (gastroesophageal reflux disease) -Protonix  Alcohol abuse -Continue CIWA with Ativan -Patient counseled extensively regarding absolute cessation of alcoholicbeverages/productsto prevent further episodes of pancreatitis- patient states he is 90% ready to quit  Protein-calorie malnutrition, severe -Patient  reports significant weight loss since Whipple procedure in 2015, pre-albumin 12.2 -Nutrition consultation -HIV negative 1/17  Tobacco abuse -encouraged cessation  Code Status: full code DVT Prophylaxis:  Lovenox Family Communication: Discussed in detail with the patient, all imaging results, lab results explained to the patient    Disposition Plan: Hopefully next 24-48 hours  Time Spent in minutes   25 minutes  Procedures:  None  Consultants  None  Antimicrobials:      Medications  Scheduled Meds: . amLODipine  5 mg Oral Daily  . cefTRIAXone (ROCEPHIN)  IV  2 g Intravenous Q24H  . enoxaparin (LOVENOX) injection  40 mg Subcutaneous Q24H  . folic acid  1 mg Oral Daily  . LORazepam  0-4 mg Intravenous Q12H  . multivitamin with minerals  1 tablet Oral Daily  . pantoprazole  40 mg Oral Daily  . thiamine  100 mg Oral Daily   Or  . thiamine  100 mg Intravenous Daily   Continuous Infusions: . sodium chloride 125 mL/hr at 05/06/16 0559   PRN Meds:.acetaminophen **OR** acetaminophen, HYDROcodone-acetaminophen, morphine injection, ondansetron (ZOFRAN) IV **OR** promethazine   Antibiotics   Anti-infectives    Start     Dose/Rate Route Frequency Ordered Stop   05/04/16 0900  cefTRIAXone (ROCEPHIN) 2 g in dextrose 5 % 50  mL IVPB     2 g 100 mL/hr over 30 Minutes Intravenous Every 24 hours 05/04/16 1610          Subjective:   Brent Rogers was seen and examined today. Patient to see was not able to tolerate full liquid diet and had abdominal pain with nausea. No fevers or chills.  Patient denies dizziness, chest pain, shortness of breath,  new weakness, numbess, tingling. No acute events overnight.    Objective:   Vitals:   05/05/16 1459 05/05/16 2219 05/05/16 2224 05/06/16 0457  BP: 131/90 (!) 144/105 (!) 132/91 (!) 138/97  Pulse: 71 (!) 59 (!) 57 (!) 51  Resp: 17 18  19   Temp: 98.2 F (36.8 C) 99.5 F (37.5 C)  97.4 F (36.3 C)  TempSrc: Oral Oral   Oral  SpO2: 100% 100%  100%  Weight:      Height:        Intake/Output Summary (Last 24 hours) at 05/06/16 1318 Last data filed at 05/06/16 9604  Gross per 24 hour  Intake          1704.17 ml  Output             1575 ml  Net           129.17 ml     Wt Readings from Last 3 Encounters:  05/03/16 67.8 kg (149 lb 6.4 oz)  03/06/16 68.5 kg (151 lb)  12/25/15 68.5 kg (151 lb)     Exam  General: Alert and oriented x 3, NAD  HEENT:   Neck:   Cardiovascular: S1 S2 auscultated, no rubs, murmurs or gallops. Regular rate and rhythm.  Respiratory: Clear to auscultation bilaterally, no wheezing, rales or rhonchi  Gastrointestinal: Soft, no significant tenderness to deep palpation , nondistended, + bowel sounds  Ext: no cyanosis clubbing or edema  Neuro:No new deficit  Skin: No rashes  Psych: Normal affect and demeanor, alert and oriented x3    Data Reviewed:  I have personally reviewed following labs and imaging studies  Micro Results Recent Results (from the past 240 hour(s))  Culture, blood (Routine X 2) w Reflex to ID Panel     Status: Abnormal   Collection Time: 05/03/16  5:11 AM  Result Value Ref Range Status   Specimen Description BLOOD RIGHT ANTECUBITAL  Final   Special Requests BOTTLES DRAWN AEROBIC ONLY 5CC  Final   Culture  Setup Time   Final    GRAM NEGATIVE RODS AEROBIC BOTTLE ONLY CRITICAL RESULT CALLED TO, READ BACK BY AND VERIFIED WITH: J. LEDFORD, PHARMD AT 0745 ON 05/04/16 BY C. JESSUP, MLT.    Culture (A)  Final    KLEBSIELLA PNEUMONIAE SUSCEPTIBILITIES PERFORMED ON PREVIOUS CULTURE WITHIN THE LAST 5 DAYS.    Report Status 05/06/2016 FINAL  Final  Culture, blood (Routine X 2) w Reflex to ID Panel     Status: Abnormal   Collection Time: 05/03/16  6:15 AM  Result Value Ref Range Status   Specimen Description BLOOD LEFT ANTECUBITAL  Final   Special Requests BOTTLES DRAWN AEROBIC ONLY 5CC  Final   Culture  Setup Time   Final    AEROBIC BOTTLE ONLY  GRAM NEGATIVE RODS CRITICAL RESULT CALLED TO, READ BACK BY AND VERIFIED WITH: J. LEDFORD, PHARMD AT 0745 ON 05/04/16 BY C. JESSUP, MLT.    Culture KLEBSIELLA PNEUMONIAE (A)  Final   Report Status 05/06/2016 FINAL  Final   Organism ID, Bacteria KLEBSIELLA PNEUMONIAE  Final  Susceptibility   Klebsiella pneumoniae - MIC*    AMPICILLIN >=32 RESISTANT Resistant     CEFAZOLIN <=4 SENSITIVE Sensitive     CEFEPIME <=1 SENSITIVE Sensitive     CEFTAZIDIME <=1 SENSITIVE Sensitive     CEFTRIAXONE <=1 SENSITIVE Sensitive     CIPROFLOXACIN <=0.25 SENSITIVE Sensitive     GENTAMICIN <=1 SENSITIVE Sensitive     IMIPENEM <=0.25 SENSITIVE Sensitive     TRIMETH/SULFA <=20 SENSITIVE Sensitive     AMPICILLIN/SULBACTAM 4 SENSITIVE Sensitive     PIP/TAZO <=4 SENSITIVE Sensitive     Extended ESBL NEGATIVE Sensitive     * KLEBSIELLA PNEUMONIAE  Blood Culture ID Panel (Reflexed)     Status: Abnormal   Collection Time: 05/03/16  6:15 AM  Result Value Ref Range Status   Enterococcus species NOT DETECTED NOT DETECTED Final   Vancomycin resistance NOT DETECTED NOT DETECTED Final   Listeria monocytogenes NOT DETECTED NOT DETECTED Final   Staphylococcus species NOT DETECTED NOT DETECTED Final   Staphylococcus aureus NOT DETECTED NOT DETECTED Final   Methicillin resistance NOT DETECTED NOT DETECTED Final   Streptococcus species NOT DETECTED NOT DETECTED Final   Streptococcus agalactiae NOT DETECTED NOT DETECTED Final   Streptococcus pneumoniae NOT DETECTED NOT DETECTED Final   Streptococcus pyogenes NOT DETECTED NOT DETECTED Final   Acinetobacter baumannii NOT DETECTED NOT DETECTED Final   Enterobacteriaceae species DETECTED (A) NOT DETECTED Final    Comment: CRITICAL RESULT CALLED TO, READ BACK BY AND VERIFIED WITH: J. LEDFORD, PHARMD AT 0745 ON 05/04/16 BY C. JESSUP, MLT.    Enterobacter cloacae complex NOT DETECTED NOT DETECTED Final   Escherichia coli NOT DETECTED NOT DETECTED Final   Klebsiella oxytoca  NOT DETECTED NOT DETECTED Final   Klebsiella pneumoniae DETECTED (A) NOT DETECTED Final    Comment: CRITICAL RESULT CALLED TO, READ BACK BY AND VERIFIED WITH: J. LEDFORD, PHARMD AT 0745 ON 05/04/16 BY C. JESSUP, MLT.    Proteus species NOT DETECTED NOT DETECTED Final   Serratia marcescens NOT DETECTED NOT DETECTED Final   Carbapenem resistance NOT DETECTED NOT DETECTED Final   Haemophilus influenzae NOT DETECTED NOT DETECTED Final   Neisseria meningitidis NOT DETECTED NOT DETECTED Final   Pseudomonas aeruginosa NOT DETECTED NOT DETECTED Final   Candida albicans NOT DETECTED NOT DETECTED Final   Candida glabrata NOT DETECTED NOT DETECTED Final   Candida krusei NOT DETECTED NOT DETECTED Final   Candida parapsilosis NOT DETECTED NOT DETECTED Final   Candida tropicalis NOT DETECTED NOT DETECTED Final  Urine culture     Status: None   Collection Time: 05/04/16 10:30 AM  Result Value Ref Range Status   Specimen Description URINE, CLEAN CATCH  Final   Special Requests NONE  Final   Culture NO GROWTH  Final   Report Status 05/05/2016 FINAL  Final  Culture, blood (Routine X 2) w Reflex to ID Panel     Status: None (Preliminary result)   Collection Time: 05/05/16  5:25 AM  Result Value Ref Range Status   Specimen Description BLOOD RIGHT ANTECUBITAL  Final   Special Requests IN PEDIATRIC BOTTLE 0.5CC  Final   Culture NO GROWTH 1 DAY  Final   Report Status PENDING  Incomplete    Radiology Reports Ct Abdomen Pelvis W Contrast  Result Date: 05/03/2016 CLINICAL DATA:  50 year old male with pancreatitis. Follow-up scan. History of Whipple's procedure. EXAM: CT ABDOMEN AND PELVIS WITH CONTRAST TECHNIQUE: Multidetector CT imaging of the abdomen and pelvis was performed using the  standard protocol following bolus administration of intravenous contrast. CONTRAST:  1 ISOVUE-300 IOPAMIDOL (ISOVUE-300) INJECTION 61% COMPARISON:  CT dated 03/07/2016 FINDINGS: O the visualized lung bases are clear. No  intra-abdominal free air or free fluid. There is mild apparent diffuse fatty infiltration of the liver. Small subcapsular areas of hypodensity involving the inferior surface of the left lobe of the liver are not well characterized but may represent focal areas of subcapsular edema, small hepatic contusions or laceration. There is no hematoma or fluid collection. No contrast extravasation. Cholecystectomy. There is postsurgical changes of the elbow with resection of the head of the pancreas. There has been interval improvement of the previously seen inflamed appearance of the pancreas with residual inflammatory changes in the peripancreatic fat. No drainable fluid collection/abscess or pseudocyst identified. The spleen, adrenal glands appear unremarkable. Small bilateral renal hypodensities, likely cysts. There is no hydronephrosis on either side. The visualized ureters and urinary bladder appear unremarkable. The prostate and seminal vesicles are grossly unremarkable. There is gastrojejunostomy which appears patent. There is no evidence of bowel obstruction or active inflammation. There is moderate stool throughout the colon. Normal appendix. There is mild aortoiliac atherosclerotic disease. The origins of the celiac axis, SMA, IMA as well as the origins of the renal arteries are patent. The SMV, and main portal vein appear patent. There is segmental narrowing of the splenic vein, likely related to mass effect and compression by the pancreatic edema. The splenic vein however remains patent. No portal venous gas identified. There is no adenopathy. The abdominal wall soft tissues appear unremarkable. There is degenerative changes of the spine. No acute fracture. IMPRESSION: Diffuse pancreatic edema with interval improvement compared to prior study. No peripancreatic collection, abscess or pseudocyst. Postsurgical changes of Whipple's procedure. No evidence of bowel obstruction. Focal subcapsular areas of lower  attenuation along the undersurface of the left lobe of the liver are not well characterized but may represent edema, contusion, or laceration. No hematoma or fluid collection. Electronically Signed   By: Elgie Collard M.D.   On: 05/03/2016 00:57    Lab Data:  CBC:  Recent Labs Lab 05/02/16 2142 05/04/16 0651 05/05/16 0724  WBC 14.3* 8.3 6.3  NEUTROABS 11.7*  --   --   HGB 14.8 11.0* 11.6*  HCT 44.2 34.9* 35.7*  MCV 84.2 84.5 83.4  PLT 270 167 152   Basic Metabolic Panel:  Recent Labs Lab 05/02/16 2142 05/03/16 1337 05/04/16 0651 05/05/16 0539 05/05/16 0724  NA 134*  --  133* 134* 132*  K 4.9  --  3.5 3.3* 3.1*  CL 96*  --  98* 99* 98*  CO2 20*  --  25 28 26   GLUCOSE 91  --  99 114* 119*  BUN 9  --  <5* <5* <5*  CREATININE 1.00  --  0.78 0.76 0.64  CALCIUM 9.8  --  8.8* 8.9 8.8*  MG  --  1.6*  --   --   --   PHOS  --  2.0*  --   --   --    GFR: Estimated Creatinine Clearance: 105.9 mL/min (by C-G formula based on SCr of 0.8 mg/dL). Liver Function Tests:  Recent Labs Lab 05/02/16 2142 05/04/16 0651 05/05/16 0724  Rogers 28 40 46*  ALT 24 22 30   ALKPHOS 156* 106 105  BILITOT 1.5* 1.4* 0.8  PROT 7.9 6.0* 5.6*  ALBUMIN 4.4 3.1* 2.9*    Recent Labs Lab 05/02/16 2142  LIPASE 25   No results  for input(s): AMMONIA in the last 168 hours. Coagulation Profile: No results for input(s): INR, PROTIME in the last 168 hours. Cardiac Enzymes: No results for input(s): CKTOTAL, CKMB, CKMBINDEX, TROPONINI in the last 168 hours. BNP (last 3 results) No results for input(s): PROBNP in the last 8760 hours. HbA1C: No results for input(s): HGBA1C in the last 72 hours. CBG: No results for input(s): GLUCAP in the last 168 hours. Lipid Profile: No results for input(s): CHOL, HDL, LDLCALC, TRIG, CHOLHDL, LDLDIRECT in the last 72 hours. Thyroid Function Tests: No results for input(s): TSH, T4TOTAL, FREET4, T3FREE, THYROIDAB in the last 72 hours. Anemia Panel: No results  for input(s): VITAMINB12, FOLATE, FERRITIN, TIBC, IRON, RETICCTPCT in the last 72 hours. Urine analysis:    Component Value Date/Time   COLORURINE YELLOW 05/03/2016 0607   APPEARANCEUR CLEAR 05/03/2016 0607   LABSPEC 1.040 (H) 05/03/2016 0607   PHURINE 5.0 05/03/2016 0607   GLUCOSEU NEGATIVE 05/03/2016 0607   HGBUR NEGATIVE 05/03/2016 0607   BILIRUBINUR NEGATIVE 05/03/2016 0607   KETONESUR >80 (A) 05/03/2016 0607   PROTEINUR NEGATIVE 05/03/2016 0607   UROBILINOGEN 1.0 06/23/2015 1513   NITRITE NEGATIVE 05/03/2016 0607   LEUKOCYTESUR NEGATIVE 05/03/2016 2130     Belisa Eichholz M.D. [facility] 05/06/2016, 1:18 PM  Pager: [phone] Between 7am to 7pm - call Pager - [phone]  After 7pm go to [email or web address] - password [email or web address]  Call night coverage person covering after 7pm

## 2016-05-06 NOTE — Progress Notes (Signed)
Pt HR 45-50s, requesting pain medication. Dr Tana Coast paged for best course of treatment for pain medication.

## 2016-05-06 NOTE — Progress Notes (Signed)
Patient HR is 51 this morning. Made K.Schorr aware.

## 2016-05-07 ENCOUNTER — Inpatient Hospital Stay (HOSPITAL_COMMUNITY): Payer: Self-pay

## 2016-05-07 DIAGNOSIS — K859 Acute pancreatitis without necrosis or infection, unspecified: Principal | ICD-10-CM

## 2016-05-07 DIAGNOSIS — B961 Klebsiella pneumoniae [K. pneumoniae] as the cause of diseases classified elsewhere: Secondary | ICD-10-CM

## 2016-05-07 DIAGNOSIS — K861 Other chronic pancreatitis: Secondary | ICD-10-CM

## 2016-05-07 DIAGNOSIS — L97502 Non-pressure chronic ulcer of other part of unspecified foot with fat layer exposed: Secondary | ICD-10-CM

## 2016-05-07 DIAGNOSIS — F1010 Alcohol abuse, uncomplicated: Secondary | ICD-10-CM

## 2016-05-07 DIAGNOSIS — R7881 Bacteremia: Secondary | ICD-10-CM

## 2016-05-07 DIAGNOSIS — L97511 Non-pressure chronic ulcer of other part of right foot limited to breakdown of skin: Secondary | ICD-10-CM

## 2016-05-07 DIAGNOSIS — L97519 Non-pressure chronic ulcer of other part of right foot with unspecified severity: Secondary | ICD-10-CM

## 2016-05-07 DIAGNOSIS — L97522 Non-pressure chronic ulcer of other part of left foot with fat layer exposed: Secondary | ICD-10-CM

## 2016-05-07 LAB — BASIC METABOLIC PANEL
Anion gap: 10 (ref 5–15)
BUN: <5 mg/dL — ABNORMAL LOW (ref 6–20)
CO2: 24 mmol/L (ref 22–32)
Calcium: 8.7 mg/dL — ABNORMAL LOW (ref 8.9–10.3)
Chloride: 103 mmol/L (ref 101–111)
Creatinine, Ser: 0.66 mg/dL (ref 0.61–1.24)
GFR calc Af Amer: >60 mL/min (ref >60)
GFR calc non Af Amer: >60 mL/min (ref >60)
Glucose, Bld: 84 mg/dL (ref 65–99)
Potassium: 3.6 mmol/L (ref 3.5–5.1)
Sodium: 137 mmol/L (ref 135–145)

## 2016-05-07 LAB — PREALBUMIN: Prealbumin: 7 mg/dL — ABNORMAL LOW (ref 18–38)

## 2016-05-07 LAB — CBC
HCT: 36 % — ABNORMAL LOW (ref 39.0–52.0)
Hemoglobin: 11.5 g/dL — ABNORMAL LOW (ref 13.0–17.0)
MCH: 27 pg (ref 26.0–34.0)
MCHC: 31.9 g/dL (ref 30.0–36.0)
MCV: 84.5 fL (ref 78.0–100.0)
Platelets: 147 10*3/uL — ABNORMAL LOW (ref 150–400)
RBC: 4.26 MIL/uL (ref 4.22–5.81)
RDW: 14.9 % (ref 11.5–15.5)
WBC: 4.8 10*3/uL (ref 4.0–10.5)

## 2016-05-07 MED ORDER — GADOBENATE DIMEGLUMINE 529 MG/ML IV SOLN
15.0000 mL | Freq: Once | INTRAVENOUS | Status: AC
  Administered 2016-05-07: 14 mL via INTRAVENOUS

## 2016-05-07 NOTE — Consult Note (Signed)
Date of Admission:  05/02/2016  Date of Consult:  05/07/2016  Reason for Consult: Klebsiella pneumonia bacteremia without clear cut cause Referring Physician: Dr. Broadus John   HPI: Brent Rogers is an 50 y.o. male with medical history significant for Whipple procedure for pancreatic mass and 2015, ongoing intermittent alcohol use hypertension recurrent episodes of pancreatitis who was admitted the hospital with intractable nausea and vomiting and left-sided abdominal pain.    Ct showed:  Diffuse pancreatic edema with interval improvement compared to prior study. No peripancreatic collection, abscess or pseudocyst.  Postsurgical changes of Whipple's procedure. No evidence of bowel obstruction.  Focal subcapsular areas of lower attenuation along the undersurface of the left lobe of the liver are not well characterized but may represent edema, contusion, or laceration. No hematoma or fluid Collection.  Blood cultures were done and are growing 2/2 Klebsiella PNA.   He does not have a clear cut source for this bacteremia.   On reviewing his record I saw that he did in the hospital several months ago and undergone imaging of his feet including his left foot with an MRI to evaluate for osteomyelitis of the time imaging did not show evidence of osteomyelitis.. The patient unfortunately because of lack of insurance was unable follow-up with a podiatrist or anyone to monitor the ulcers he had in this foot or the other foot and they have persisted. He has not noticed worsening drainage but the ulcers again have continued anyhow now has a new ulcer between to his toes in the left side.    Past Medical History:  Diagnosis Date  . Chronic pancreatitis ([place])    S/P Whipple  . Hypertension   . Migraine    "@ least once/month" (11/12/2015)  . Pancreatic abnormality    CT  shows mass  . Pneumonia 11/12/2015  . Scoliosis     Past Surgical History:  Procedure Laterality Date  . BACK  SURGERY    . EUS N/A 09/21/2013   Procedure: ESOPHAGEAL ENDOSCOPIC ULTRASOUND (EUS) RADIAL;  Surgeon: Beryle Beams, MD;  Location: [facility];  Service: Endoscopy;  Laterality: N/A;  . EUS N/A 09/30/2013   Procedure: UPPER ENDOSCOPIC ULTRASOUND (EUS) LINEAR;  Surgeon: Beryle Beams, MD;  Location: [facility];  Service: Endoscopy;  Laterality: N/A;  . FINE NEEDLE ASPIRATION N/A 09/21/2013   Procedure: FINE NEEDLE ASPIRATION (FNA) LINEAR;  Surgeon: Beryle Beams, MD;  Location: [facility];  Service: Endoscopy;  Laterality: N/A;  . FINGER FRACTURE SURGERY Left 1995   5th digit  . FRACTURE SURGERY    . KNEE ARTHROSCOPY Bilateral 1987-1989   right-left  . LAPAROSCOPY N/A 12/01/2013   Procedure: LAPAROSCOPY DIAGNOSTIC PANCREATICODUODENECTOMY WITH BILIARY AND PANCREATIC STENTS;  Surgeon: Stark Klein, MD;  Location: [facility];  Service: General;  Laterality: N/A;  . LUMBAR [place] SURGERY  1990's  . WHIPPLE PROCEDURE  12/01/2013    Social History:  reports that he has been smoking Cigarettes.  He has a 7.50 pack-year smoking history. He has quit using smokeless tobacco. His smokeless tobacco use included Snuff. He reports that he drinks about 2.4 oz of alcohol per week . He reports that he does not use drugs.   Family History  Problem Relation Age of Onset  . Cancer Mother     unsure  . Hypertension Father     No Known Allergies   Medications: I have reviewed patients current medications as documented in Epic Anti-infectives    Start  Dose/Rate Route Frequency Ordered Stop   05/04/16 0900  cefTRIAXone (ROCEPHIN) 2 g in dextrose 5 % 50 mL IVPB     2 g 100 mL/hr over 30 Minutes Intravenous Every 24 hours 05/04/16 0752           ROS:  as in HPI otherwise remainder of 12 point Review of Systems is negative   Blood pressure (!) 147/91, pulse (!) 48, temperature 98 F (36.7 C), temperature source Oral, resp. rate 16, height 6\' 3"  (1.905 m), weight 149 lb 6.4 oz (67.8 kg), SpO2 97  %. General: Alert and awake, oriented x3, not in any acute distress. HEENT: anicteric sclera,  EOMI, oropharynx clear and without exudate Cardiovascular: egular rate, normal r,  no murmur rubs or gallops Pulmonary: clear to auscultation bilaterally, no wheezing, rales or rhonchi Gastrointestinal: soft TTP in left side, nondistended, normal bowel sounds, Musculoskeletal: Skin:  05/07/16: Left foot         05/07/16: Right foot:      Neuro: nonfocal, strength and sensation intact   Results for orders placed or performed during the hospital encounter of 05/02/16 (from the past 48 hour(s))  Basic metabolic panel     Status: Abnormal   Collection Time: 05/07/16  5:14 AM  Result Value Ref Range   Sodium 137 135 - 145 mmol/L   Potassium 3.6 3.5 - 5.1 mmol/L   Chloride 103 101 - 111 mmol/L   CO2 24 22 - 32 mmol/L   Glucose, Bld 84 65 - 99 mg/dL   BUN <5 (L) 6 - 20 mg/dL   Creatinine, Ser 07/07/16 0.61 - 1.24 mg/dL   Calcium 8.7 (L) 8.9 - 10.3 mg/dL   GFR calc non Af Amer >60 >60 mL/min   GFR calc Af Amer >60 >60 mL/min    Comment: (NOTE) The eGFR has been calculated using the CKD EPI equation. This calculation has not been validated in all clinical situations. eGFR's persistently <60 mL/min signify possible Chronic Kidney Disease.    Anion gap 10 5 - 15  CBC     Status: Abnormal   Collection Time: 05/07/16  5:14 AM  Result Value Ref Range   WBC 4.8 4.0 - 10.5 K/uL   RBC 4.26 4.22 - 5.81 MIL/uL   Hemoglobin 11.5 (L) 13.0 - 17.0 g/dL   HCT 07/07/16 (L) 19.7 - 77.1 %   MCV 84.5 78.0 - 100.0 fL   MCH 27.0 26.0 - 34.0 pg   MCHC 31.9 30.0 - 36.0 g/dL   RDW 54.2 98.5 - 90.3 %   Platelets 147 (L) 150 - 400 K/uL   @BRIEFLABTABLE (sdes,specrequest,cult,reptstatus)   ) Recent Results (from the past 720 hour(s))  Culture, blood (Routine X 2) w Reflex to ID Panel     Status: Abnormal   Collection Time: 05/03/16  5:11 AM  Result Value Ref Range Status   Specimen Description BLOOD  RIGHT ANTECUBITAL  Final   Special Requests BOTTLES DRAWN AEROBIC ONLY 5CC  Final   Culture  Setup Time   Final    GRAM NEGATIVE RODS AEROBIC BOTTLE ONLY CRITICAL RESULT CALLED TO, READ BACK BY AND VERIFIED WITH: J. LEDFORD, PHARMD AT 0745 ON 05/04/16 BY C. JESSUP, MLT.    Culture (A)  Final    KLEBSIELLA PNEUMONIAE SUSCEPTIBILITIES PERFORMED ON PREVIOUS CULTURE WITHIN THE LAST 5 DAYS.    Report Status 05/06/2016 FINAL  Final  Culture, blood (Routine X 2) w Reflex to ID Panel     Status: Abnormal  Collection Time: 05/03/16  6:15 AM  Result Value Ref Range Status   Specimen Description BLOOD LEFT ANTECUBITAL  Final   Special Requests BOTTLES DRAWN AEROBIC ONLY 5CC  Final   Culture  Setup Time   Final    AEROBIC BOTTLE ONLY GRAM NEGATIVE RODS CRITICAL RESULT CALLED TO, READ BACK BY AND VERIFIED WITH: J. LEDFORD, PHARMD AT 0745 ON 05/04/16 BY C. JESSUP, MLT.    Culture KLEBSIELLA PNEUMONIAE (A)  Final   Report Status 05/06/2016 FINAL  Final   Organism ID, Bacteria KLEBSIELLA PNEUMONIAE  Final      Susceptibility   Klebsiella pneumoniae - MIC*    AMPICILLIN >=32 RESISTANT Resistant     CEFAZOLIN <=4 SENSITIVE Sensitive     CEFEPIME <=1 SENSITIVE Sensitive     CEFTAZIDIME <=1 SENSITIVE Sensitive     CEFTRIAXONE <=1 SENSITIVE Sensitive     CIPROFLOXACIN <=0.25 SENSITIVE Sensitive     GENTAMICIN <=1 SENSITIVE Sensitive     IMIPENEM <=0.25 SENSITIVE Sensitive     TRIMETH/SULFA <=20 SENSITIVE Sensitive     AMPICILLIN/SULBACTAM 4 SENSITIVE Sensitive     PIP/TAZO <=4 SENSITIVE Sensitive     Extended ESBL NEGATIVE Sensitive     * KLEBSIELLA PNEUMONIAE  Blood Culture ID Panel (Reflexed)     Status: Abnormal   Collection Time: 05/03/16  6:15 AM  Result Value Ref Range Status   Enterococcus species NOT DETECTED NOT DETECTED Final   Vancomycin resistance NOT DETECTED NOT DETECTED Final   Listeria monocytogenes NOT DETECTED NOT DETECTED Final   Staphylococcus species NOT DETECTED NOT  DETECTED Final   Staphylococcus aureus NOT DETECTED NOT DETECTED Final   Methicillin resistance NOT DETECTED NOT DETECTED Final   Streptococcus species NOT DETECTED NOT DETECTED Final   Streptococcus agalactiae NOT DETECTED NOT DETECTED Final   Streptococcus pneumoniae NOT DETECTED NOT DETECTED Final   Streptococcus pyogenes NOT DETECTED NOT DETECTED Final   Acinetobacter baumannii NOT DETECTED NOT DETECTED Final   Enterobacteriaceae species DETECTED (A) NOT DETECTED Final    Comment: CRITICAL RESULT CALLED TO, READ BACK BY AND VERIFIED WITH: J. LEDFORD, PHARMD AT 0745 ON 05/04/16 BY C. JESSUP, MLT.    Enterobacter cloacae complex NOT DETECTED NOT DETECTED Final   Escherichia coli NOT DETECTED NOT DETECTED Final   Klebsiella oxytoca NOT DETECTED NOT DETECTED Final   Klebsiella pneumoniae DETECTED (A) NOT DETECTED Final    Comment: CRITICAL RESULT CALLED TO, READ BACK BY AND VERIFIED WITH: J. LEDFORD, PHARMD AT 0745 ON 05/04/16 BY C. JESSUP, MLT.    Proteus species NOT DETECTED NOT DETECTED Final   Serratia marcescens NOT DETECTED NOT DETECTED Final   Carbapenem resistance NOT DETECTED NOT DETECTED Final   Haemophilus influenzae NOT DETECTED NOT DETECTED Final   Neisseria meningitidis NOT DETECTED NOT DETECTED Final   Pseudomonas aeruginosa NOT DETECTED NOT DETECTED Final   Candida albicans NOT DETECTED NOT DETECTED Final   Candida glabrata NOT DETECTED NOT DETECTED Final   Candida krusei NOT DETECTED NOT DETECTED Final   Candida parapsilosis NOT DETECTED NOT DETECTED Final   Candida tropicalis NOT DETECTED NOT DETECTED Final  Urine culture     Status: None   Collection Time: 05/04/16 10:30 AM  Result Value Ref Range Status   Specimen Description URINE, CLEAN CATCH  Final   Special Requests NONE  Final   Culture NO GROWTH  Final   Report Status 05/05/2016 FINAL  Final  Culture, blood (Routine X 2) w Reflex to ID Panel  Status: None (Preliminary result)   Collection Time: 05/05/16   5:25 AM  Result Value Ref Range Status   Specimen Description BLOOD RIGHT ANTECUBITAL  Final   Special Requests IN PEDIATRIC BOTTLE 0.5CC  Final   Culture NO GROWTH 2 DAYS  Final   Report Status PENDING  Incomplete     Impression/Recommendation  Principal Problem:   Acute on chronic pancreatitis (HCC) Active Problems:   Protein-calorie malnutrition, severe (HCC)   Tobacco abuse   HTN (hypertension)   Fatty liver   GERD (gastroesophageal reflux disease)   Intractable nausea and vomiting   Alcohol abuse   Bacteremia   Brent Rogers is a 50 y.o. male with  Hx of Whipples' for pancreatic mass, intermittent etoh use admitted for pancreatitis but found to have Klebsiella PNA bacteremia  #1 Klebsiella pneumonia bacteremia:  Continue ceftriaxone  Repeat blood cultures to ensure clearing  Obtain MRI of bilateral feet to see if these are the source of his infection  Check ESR, CRP    #2 Screening: ensure HIV, hep virus panel have been checked   05/07/2016, 5:13 PM   Thank you so much for this interesting consult  [place] [facility] [place] [phone] (pager) [phone] (office) 05/07/2016, 5:13 PM  Rhina [place] 05/07/2016, 5:13 PM

## 2016-05-07 NOTE — Progress Notes (Signed)
[facility]                                                                              Patient Demographics  Brent Rogers, is a 50 y.o. male, DOB - 12-14-1965, ZOX:[id number]  Admit date - 05/02/2016   Admitting Physician Courage Mariea Clonts, MD  Outpatient Primary MD for the patient is Lora Paula, MD  Outpatient specialists:   LOS - 3  days    Chief Complaint  Patient presents with  . Chest Pain  . Abdominal Pain       Brief summary   Brent Dendinger Fosteris a 50 y.o.malewith medical history significant for prior Whipple procedure for pancreatic mass in 2015, ongoing intermittent alcohol use and ongoing tobacco abuse, hypertension, severe protein calorie malnutrition, reflux, fatty liver disease. Patient has a history of frequent hospital admissions for exacerbation of pancreatitis manifesting as abdominal pain w/nausea and vomiting. Patient was recently discharged on 6/13 after similar episode. Presented to ER with pain after ingesting alcohol. Found to have Klebsiella bacteremia.  IV abx started on 8/6.     Assessment & Plan    Klebsiella bacteremia -source unclear, from blood Cx on 8/5, repeat Blood cx negative, no fevers/WBC was up on admission -on Iv Rocephin Day 4 now -Urine Cx negative, Ct abd without abscess/pseudocyst apart from pancreatic edema -ID consulted, d/w Dr.Van Dam  Acute on chronic pancreatitis  -recent alcohol ingestion -CT scan shows improvement pancreatic and small bowel edema from previous episode of pancreatitis in June -resume clears and then advance diet as tolerated -IVf, supportive care  Fatty liver -Secondary to ongoing alcohol use -counseled  GERD (gastroesophageal reflux disease) -Protonix  Alcohol abuse -Continue CIWA with Ativan -no overt withdrawal  Protein-calorie malnutrition, severe -Patient reports significant weight loss since Whipple procedure in 2015, pre-albumin 12.2 -Nutrition  consultation -HIV negative 1/17  Tobacco abuse -encouraged cessation  Code Status: full code DVT Prophylaxis:  Lovenox Family Communication: Discussed in detail with the patient, all imaging results, lab results explained to the patient   Disposition Plan: Home in few days  Time Spent in minutes   25 minutes  Procedures:  None  Consultants  None  Antimicrobials:      Medications  Scheduled Meds: . cefTRIAXone (ROCEPHIN)  IV  2 g Intravenous Q24H  . enoxaparin (LOVENOX) injection  40 mg Subcutaneous Q24H  . folic acid  1 mg Oral Daily  . multivitamin with minerals  1 tablet Oral Daily  . pantoprazole  40 mg Oral Daily  . thiamine  100 mg Oral Daily   Or  . thiamine  100 mg Intravenous Daily   Continuous Infusions: . sodium chloride 125 mL/hr at 05/07/16 0343   PRN Meds:.acetaminophen **OR** acetaminophen, HYDROcodone-acetaminophen, ketorolac, ondansetron (ZOFRAN) IV **OR** promethazine   Antibiotics   Anti-infectives    Start     Dose/Rate Route Frequency Ordered Stop   05/04/16 0900  cefTRIAXone (ROCEPHIN) 2 g in dextrose 5 % 50 mL IVPB     2 g 100 mL/hr over 30 Minutes Intravenous Every 24 hours 05/04/16 0752          Subjective:  abd pain better, no N/V  Objective:   Vitals:   05/06/16 0457 05/06/16 1428 05/06/16 2011 05/07/16 0624  BP: (!) 138/97 129/89 139/87 140/88  Pulse: (!) 51 (!) 47 69 (!) 49  Resp: 19 16 16 18   Temp: 97.4 F (36.3 C) 97.6 F (36.4 C) 98.1 F (36.7 C) 97.4 F (36.3 C)  TempSrc: Oral Oral Oral Oral  SpO2: 100% 100% 100% 100%  Weight:      Height:        Intake/Output Summary (Last 24 hours) at 05/07/16 1044 Last data filed at 05/07/16 0949  Gross per 24 hour  Intake          1852.08 ml  Output             2700 ml  Net          -847.92 ml     Wt Readings from Last 3 Encounters:  05/03/16 67.8 kg (149 lb 6.4 oz)  03/06/16 68.5 kg (151 lb)  12/25/15 68.5 kg (151 lb)     Exam  General: Alert and  oriented x 3, NAD  HEENT: PERRLA  Cardiovascular: S1 S2 auscultated, no rubs, murmurs or gallops. Regular rate and rhythm.  Respiratory: Clear to auscultation bilaterally, no wheezing, rales or rhonchi  Gastrointestinal: Soft, no significant tenderness to deep palpation , nondistended, + bowel sounds  Ext: no cyanosis clubbing or edema  Neuro:No new deficit  Skin: No rashes  Psych: Normal affect and demeanor, alert and oriented x3    Data Reviewed:  I have personally reviewed following labs and imaging studies  Micro Results Recent Results (from the past 240 hour(s))  Culture, blood (Routine X 2) w Reflex to ID Panel     Status: Abnormal   Collection Time: 05/03/16  5:11 AM  Result Value Ref Range Status   Specimen Description BLOOD RIGHT ANTECUBITAL  Final   Special Requests BOTTLES DRAWN AEROBIC ONLY 5CC  Final   Culture  Setup Time   Final    GRAM NEGATIVE RODS AEROBIC BOTTLE ONLY CRITICAL RESULT CALLED TO, READ BACK BY AND VERIFIED WITH: J. LEDFORD, PHARMD AT 0745 ON 05/04/16 BY C. JESSUP, MLT.    Culture (A)  Final    KLEBSIELLA PNEUMONIAE SUSCEPTIBILITIES PERFORMED ON PREVIOUS CULTURE WITHIN THE LAST 5 DAYS.    Report Status 05/06/2016 FINAL  Final  Culture, blood (Routine X 2) w Reflex to ID Panel     Status: Abnormal   Collection Time: 05/03/16  6:15 AM  Result Value Ref Range Status   Specimen Description BLOOD LEFT ANTECUBITAL  Final   Special Requests BOTTLES DRAWN AEROBIC ONLY 5CC  Final   Culture  Setup Time   Final    AEROBIC BOTTLE ONLY GRAM NEGATIVE RODS CRITICAL RESULT CALLED TO, READ BACK BY AND VERIFIED WITH: J. LEDFORD, PHARMD AT 0745 ON 05/04/16 BY C. JESSUP, MLT.    Culture KLEBSIELLA PNEUMONIAE (A)  Final   Report Status 05/06/2016 FINAL  Final   Organism ID, Bacteria KLEBSIELLA PNEUMONIAE  Final      Susceptibility   Klebsiella pneumoniae - MIC*    AMPICILLIN >=32 RESISTANT Resistant     CEFAZOLIN <=4 SENSITIVE Sensitive     CEFEPIME <=1  SENSITIVE Sensitive     CEFTAZIDIME <=1 SENSITIVE Sensitive     CEFTRIAXONE <=1 SENSITIVE Sensitive     CIPROFLOXACIN <=0.25 SENSITIVE Sensitive     GENTAMICIN <=1 SENSITIVE Sensitive     IMIPENEM <=0.25 SENSITIVE Sensitive  TRIMETH/SULFA <=20 SENSITIVE Sensitive     AMPICILLIN/SULBACTAM 4 SENSITIVE Sensitive     PIP/TAZO <=4 SENSITIVE Sensitive     Extended ESBL NEGATIVE Sensitive     * KLEBSIELLA PNEUMONIAE  Blood Culture ID Panel (Reflexed)     Status: Abnormal   Collection Time: 05/03/16  6:15 AM  Result Value Ref Range Status   Enterococcus species NOT DETECTED NOT DETECTED Final   Vancomycin resistance NOT DETECTED NOT DETECTED Final   Listeria monocytogenes NOT DETECTED NOT DETECTED Final   Staphylococcus species NOT DETECTED NOT DETECTED Final   Staphylococcus aureus NOT DETECTED NOT DETECTED Final   Methicillin resistance NOT DETECTED NOT DETECTED Final   Streptococcus species NOT DETECTED NOT DETECTED Final   Streptococcus agalactiae NOT DETECTED NOT DETECTED Final   Streptococcus pneumoniae NOT DETECTED NOT DETECTED Final   Streptococcus pyogenes NOT DETECTED NOT DETECTED Final   Acinetobacter baumannii NOT DETECTED NOT DETECTED Final   Enterobacteriaceae species DETECTED (A) NOT DETECTED Final    Comment: CRITICAL RESULT CALLED TO, READ BACK BY AND VERIFIED WITH: J. LEDFORD, PHARMD AT 0745 ON 05/04/16 BY C. JESSUP, MLT.    Enterobacter cloacae complex NOT DETECTED NOT DETECTED Final   Escherichia coli NOT DETECTED NOT DETECTED Final   Klebsiella oxytoca NOT DETECTED NOT DETECTED Final   Klebsiella pneumoniae DETECTED (A) NOT DETECTED Final    Comment: CRITICAL RESULT CALLED TO, READ BACK BY AND VERIFIED WITH: J. LEDFORD, PHARMD AT 0745 ON 05/04/16 BY C. JESSUP, MLT.    Proteus species NOT DETECTED NOT DETECTED Final   Serratia marcescens NOT DETECTED NOT DETECTED Final   Carbapenem resistance NOT DETECTED NOT DETECTED Final   Haemophilus influenzae NOT DETECTED NOT  DETECTED Final   Neisseria meningitidis NOT DETECTED NOT DETECTED Final   Pseudomonas aeruginosa NOT DETECTED NOT DETECTED Final   Candida albicans NOT DETECTED NOT DETECTED Final   Candida glabrata NOT DETECTED NOT DETECTED Final   Candida krusei NOT DETECTED NOT DETECTED Final   Candida parapsilosis NOT DETECTED NOT DETECTED Final   Candida tropicalis NOT DETECTED NOT DETECTED Final  Urine culture     Status: None   Collection Time: 05/04/16 10:30 AM  Result Value Ref Range Status   Specimen Description URINE, CLEAN CATCH  Final   Special Requests NONE  Final   Culture NO GROWTH  Final   Report Status 05/05/2016 FINAL  Final  Culture, blood (Routine X 2) w Reflex to ID Panel     Status: None (Preliminary result)   Collection Time: 05/05/16  5:25 AM  Result Value Ref Range Status   Specimen Description BLOOD RIGHT ANTECUBITAL  Final   Special Requests IN PEDIATRIC BOTTLE 0.5CC  Final   Culture NO GROWTH 1 DAY  Final   Report Status PENDING  Incomplete    Radiology Reports Ct Abdomen Pelvis W Contrast  Result Date: 05/03/2016 CLINICAL DATA:  50 year old male with pancreatitis. Follow-up scan. History of Whipple's procedure. EXAM: CT ABDOMEN AND PELVIS WITH CONTRAST TECHNIQUE: Multidetector CT imaging of the abdomen and pelvis was performed using the standard protocol following bolus administration of intravenous contrast. CONTRAST:  1 ISOVUE-300 IOPAMIDOL (ISOVUE-300) INJECTION 61% COMPARISON:  CT dated 03/07/2016 FINDINGS: O the visualized lung bases are clear. No intra-abdominal free air or free fluid. There is mild apparent diffuse fatty infiltration of the liver. Small subcapsular areas of hypodensity involving the inferior surface of the left lobe of the liver are not well characterized but may represent focal areas of subcapsular edema,  small hepatic contusions or laceration. There is no hematoma or fluid collection. No contrast extravasation. Cholecystectomy. There is postsurgical  changes of the elbow with resection of the head of the pancreas. There has been interval improvement of the previously seen inflamed appearance of the pancreas with residual inflammatory changes in the peripancreatic fat. No drainable fluid collection/abscess or pseudocyst identified. The spleen, adrenal glands appear unremarkable. Small bilateral renal hypodensities, likely cysts. There is no hydronephrosis on either side. The visualized ureters and urinary bladder appear unremarkable. The prostate and seminal vesicles are grossly unremarkable. There is gastrojejunostomy which appears patent. There is no evidence of bowel obstruction or active inflammation. There is moderate stool throughout the colon. Normal appendix. There is mild aortoiliac atherosclerotic disease. The origins of the celiac axis, SMA, IMA as well as the origins of the renal arteries are patent. The SMV, and main portal vein appear patent. There is segmental narrowing of the splenic vein, likely related to mass effect and compression by the pancreatic edema. The splenic vein however remains patent. No portal venous gas identified. There is no adenopathy. The abdominal wall soft tissues appear unremarkable. There is degenerative changes of the spine. No acute fracture. IMPRESSION: Diffuse pancreatic edema with interval improvement compared to prior study. No peripancreatic collection, abscess or pseudocyst. Postsurgical changes of Whipple's procedure. No evidence of bowel obstruction. Focal subcapsular areas of lower attenuation along the undersurface of the left lobe of the liver are not well characterized but may represent edema, contusion, or laceration. No hematoma or fluid collection. Electronically Signed   By: Elgie Collard M.D.   On: 05/03/2016 00:57    Lab Data:  CBC:  Recent Labs Lab 05/02/16 2142 05/04/16 0651 05/05/16 0724 05/07/16 0514  WBC 14.3* 8.3 6.3 4.8  NEUTROABS 11.7*  --   --   --   HGB 14.8 11.0* 11.6* 11.5*   HCT 44.2 34.9* 35.7* 36.0*  MCV 84.2 84.5 83.4 84.5  PLT 270 167 152 147*   Basic Metabolic Panel:  Recent Labs Lab 05/02/16 2142 05/03/16 1337 05/04/16 0651 05/05/16 0539 05/05/16 0724 05/07/16 0514  NA 134*  --  133* 134* 132* 137  K 4.9  --  3.5 3.3* 3.1* 3.6  CL 96*  --  98* 99* 98* 103  CO2 20*  --  25 28 26 24   GLUCOSE 91  --  99 114* 119* 84  BUN 9  --  <5* <5* <5* <5*  CREATININE 1.00  --  0.78 0.76 0.64 0.66  CALCIUM 9.8  --  8.8* 8.9 8.8* 8.7*  MG  --  1.6*  --   --   --   --   PHOS  --  2.0*  --   --   --   --    GFR: Estimated Creatinine Clearance: 105.9 mL/min (by C-G formula based on SCr of 0.8 mg/dL). Liver Function Tests:  Recent Labs Lab 05/02/16 2142 05/04/16 0651 05/05/16 0724  AST 28 40 46*  ALT 24 22 30   ALKPHOS 156* 106 105  BILITOT 1.5* 1.4* 0.8  PROT 7.9 6.0* 5.6*  ALBUMIN 4.4 3.1* 2.9*    Recent Labs Lab 05/02/16 2142  LIPASE 25   No results for input(s): AMMONIA in the last 168 hours. Coagulation Profile: No results for input(s): INR, PROTIME in the last 168 hours. Cardiac Enzymes: No results for input(s): CKTOTAL, CKMB, CKMBINDEX, TROPONINI in the last 168 hours. BNP (last 3 results) No results for input(s): PROBNP in the last  8760 hours. HbA1C: No results for input(s): HGBA1C in the last 72 hours. CBG: No results for input(s): GLUCAP in the last 168 hours. Lipid Profile: No results for input(s): CHOL, HDL, LDLCALC, TRIG, CHOLHDL, LDLDIRECT in the last 72 hours. Thyroid Function Tests: No results for input(s): TSH, T4TOTAL, FREET4, T3FREE, THYROIDAB in the last 72 hours. Anemia Panel: No results for input(s): VITAMINB12, FOLATE, FERRITIN, TIBC, IRON, RETICCTPCT in the last 72 hours. Urine analysis:    Component Value Date/Time   COLORURINE YELLOW 05/03/2016 0607   APPEARANCEUR CLEAR 05/03/2016 0607   LABSPEC 1.040 (H) 05/03/2016 0607   PHURINE 5.0 05/03/2016 0607   GLUCOSEU NEGATIVE 05/03/2016 0607   HGBUR NEGATIVE  05/03/2016 0607   BILIRUBINUR NEGATIVE 05/03/2016 0607   KETONESUR >80 (A) 05/03/2016 0607   PROTEINUR NEGATIVE 05/03/2016 0607   UROBILINOGEN 1.0 06/23/2015 1513   NITRITE NEGATIVE 05/03/2016 0607   LEUKOCYTESUR NEGATIVE 05/03/2016 0454     Emigdio Wildeman M.D. [facility] 05/07/2016, 10:44 AM  Pager: [phone] Between 7am to 7pm - call Pager - [phone]  After 7pm go to [email or web address] - password [email or web address]  Call night coverage person covering after 7pm

## 2016-05-08 ENCOUNTER — Inpatient Hospital Stay (HOSPITAL_COMMUNITY): Payer: Self-pay

## 2016-05-08 DIAGNOSIS — M79609 Pain in unspecified limb: Secondary | ICD-10-CM

## 2016-05-08 DIAGNOSIS — A498 Other bacterial infections of unspecified site: Secondary | ICD-10-CM

## 2016-05-08 DIAGNOSIS — L97512 Non-pressure chronic ulcer of other part of right foot with fat layer exposed: Secondary | ICD-10-CM

## 2016-05-08 DIAGNOSIS — F1020 Alcohol dependence, uncomplicated: Secondary | ICD-10-CM

## 2016-05-08 DIAGNOSIS — M7989 Other specified soft tissue disorders: Secondary | ICD-10-CM

## 2016-05-08 LAB — CBC
HCT: 34.9 % — ABNORMAL LOW (ref 39.0–52.0)
Hemoglobin: 11 g/dL — ABNORMAL LOW (ref 13.0–17.0)
MCH: 26.6 pg (ref 26.0–34.0)
MCHC: 31.5 g/dL (ref 30.0–36.0)
MCV: 84.3 fL (ref 78.0–100.0)
Platelets: 155 10*3/uL (ref 150–400)
RBC: 4.14 MIL/uL — ABNORMAL LOW (ref 4.22–5.81)
RDW: 14.8 % (ref 11.5–15.5)
WBC: 5.7 10*3/uL (ref 4.0–10.5)

## 2016-05-08 LAB — SEDIMENTATION RATE: Sed Rate: 18 mm/hr — ABNORMAL HIGH (ref 0–16)

## 2016-05-08 LAB — C-REACTIVE PROTEIN: CRP: 1 mg/dL — ABNORMAL HIGH (ref <1.0)

## 2016-05-08 LAB — HIV ANTIBODY (ROUTINE TESTING W REFLEX): HIV Screen 4th Generation wRfx: NONREACTIVE

## 2016-05-08 MED ORDER — MUPIROCIN CALCIUM 2 % EX CREA
TOPICAL_CREAM | Freq: Every day | CUTANEOUS | Status: DC
  Administered 2016-05-08 – 2016-05-09 (×2): 1 via TOPICAL
  Filled 2016-05-08: qty 15

## 2016-05-08 MED ORDER — PRO-STAT SUGAR FREE PO LIQD
30.0000 mL | Freq: Three times a day (TID) | ORAL | Status: DC
  Administered 2016-05-08 – 2016-05-09 (×2): 30 mL via ORAL
  Filled 2016-05-08 (×2): qty 30

## 2016-05-08 MED ORDER — AMOXICILLIN-POT CLAVULANATE 875-125 MG PO TABS
1.0000 | ORAL_TABLET | Freq: Two times a day (BID) | ORAL | Status: DC
  Administered 2016-05-09: 1 via ORAL
  Filled 2016-05-08: qty 1

## 2016-05-08 NOTE — Progress Notes (Signed)
Orthopedic Tech Progress Note Patient Details:  Brent Rogers 17-Apr-1966 [id number]  Ortho Devices Type of Ortho Device: Darco shoe Ortho Device/Splint Location: bilateral Ortho Device/Splint Interventions: Application   [name] 05/08/2016, 10:23 AM As ordered by Dr. Verlene Mayer

## 2016-05-08 NOTE — Progress Notes (Signed)
VASCULAR LAB PRELIMINARY  ARTERIAL  ABI completed: Bilateral ABI's are within normal limits. Right TBI is within normal limits. Left TBI is abnormal with normal appearing waveforms.     RIGHT    LEFT    PRESSURE WAVEFORM  PRESSURE WAVEFORM  BRACHIAL 149 Triphasic BRACHIAL 143 Triphasic  DP 177 Triphasic DP 161 Biphasic  PT 170 Triphasic PT 179 Triphasic  GREAT TOE 105 NA GREAT TOE 89 NA    RIGHT LEFT  ABI 1.19 1.2     Legrand Como, RVT 05/08/2016, 2:30 PM

## 2016-05-08 NOTE — Progress Notes (Signed)
[facility]                                                                              Patient Demographics  Brent Rogers, is a 50 y.o. male, DOB - 01/01/1966, UJW:[id number]  Admit date - 05/02/2016   Admitting Physician Courage Mariea Clonts, MD  Outpatient Primary MD for the patient is Lora Paula, MD  Outpatient specialists:   LOS - 4  days    Chief Complaint  Patient presents with  . Chest Pain  . Abdominal Pain       Brief summary   Brent Rogers a 50 y.o.malewith medical history significant for prior Whipple procedure for pancreatic mass in 2015, ongoing intermittent alcohol use and ongoing tobacco abuse, hypertension, severe protein calorie malnutrition, reflux, fatty liver disease. Patient has a history of frequent hospital admissions for exacerbation of pancreatitis manifesting as abdominal pain w/nausea and vomiting. Patient was recently discharged on 6/13 after similar episode. Presented to ER with pain after ingesting alcohol. Found to have Klebsiella bacteremia.  IV abx started on 8/6.     Assessment & Plan   Klebsiella bacteremia -source unclear, from blood Cx on 8/5, repeat Blood cx negative, no fevers/WBC was up on admission -on Iv Rocephin Day 5 now -Urine Cx negative, Ct abd without abscess/pseudocyst apart from pancreatic edema -ID consult per Dr.Van Dam appreciated -MRI Nonspecific myositis in the short flexor muscles of the foot, no overt evidence of infection  Acute on chronic pancreatitis  -recent alcohol ingestion -CT scan shows improvement pancreatic and small bowel edema from previous episode of pancreatitis in June -advance diet as tolerated -IVf, supportive care  Fatty liver -Secondary to ongoing alcohol use -counseled  GERD (gastroesophageal reflux disease) -Protonix  Alcohol abuse -Continue CIWA with Ativan -no overt withdrawal  Protein-calorie malnutrition, severe -Patient reports  significant weight loss since Whipple procedure in 2015, pre-albumin 12.2 -Nutrition consultation -HIV negative 1/17  Tobacco abuse -encouraged cessation  Code Status: full code DVT Prophylaxis:  Lovenox Family Communication: Discussed in detail with the patient, all imaging results, lab results explained to the patient   Disposition Plan: Home in few days  Time Spent in minutes   25 minutes  Procedures:  None  Consultants  None  Antimicrobials:      Medications  Scheduled Meds: . [START ON 05/09/2016] amoxicillin-clavulanate  1 tablet Oral Q12H  . enoxaparin (LOVENOX) injection  40 mg Subcutaneous Q24H  . folic acid  1 mg Oral Daily  . multivitamin with minerals  1 tablet Oral Daily  . mupirocin cream   Topical Daily  . pantoprazole  40 mg Oral Daily  . thiamine  100 mg Oral Daily   Continuous Infusions: . sodium chloride 125 mL/hr at 05/08/16 0816   PRN Meds:.acetaminophen **OR** acetaminophen, HYDROcodone-acetaminophen, ketorolac, ondansetron (ZOFRAN) IV **OR** promethazine   Antibiotics   Anti-infectives    Start     Dose/Rate Route Frequency Ordered Stop   05/09/16 1000  amoxicillin-clavulanate (AUGMENTIN) 875-125 MG per tablet 1 tablet     1 tablet Oral Every 12 hours 05/08/16 1304 05/18/16 0959   05/04/16 0900  cefTRIAXone (ROCEPHIN) 2  g in dextrose 5 % 50 mL IVPB  Status:  Discontinued     2 g 100 mL/hr over 30 Minutes Intravenous Every 24 hours 05/04/16 0752 05/08/16 1304        Subjective:   abd pain better, no N/V  Objective:   Vitals:   05/07/16 1501 05/07/16 2159 05/08/16 0629 05/08/16 1320  BP: (!) 147/91 121/81 (!) 136/92 129/87  Pulse: (!) 48 (!) 55 (!) 48 (!) 52  Resp: 16 18 17 16   Temp: 98 F (36.7 C) 97.9 F (36.6 C) 98.3 F (36.8 C) 98.8 F (37.1 C)  TempSrc: Oral  Oral Oral  SpO2: 97% 100% 100% 100%  Weight:      Height:        Intake/Output Summary (Last 24 hours) at 05/08/16 1453 Last data filed at 05/08/16  1312  Gross per 24 hour  Intake          3659.58 ml  Output             2200 ml  Net          1459.58 ml     Wt Readings from Last 3 Encounters:  05/03/16 67.8 kg (149 lb 6.4 oz)  03/06/16 68.5 kg (151 lb)  12/25/15 68.5 kg (151 lb)     Exam  General: Alert and oriented x 3, NAD  HEENT: PERRLA  Cardiovascular: S1 S2 auscultated, no rubs, murmurs or gallops. Regular rate and rhythm.  Respiratory: Clear to auscultation bilaterally, no wheezing, rales or rhonchi  Gastrointestinal: Soft, no significant tenderness to deep palpation , nondistended, + bowel sounds  Ext: no cyanosis clubbing or edema  Neuro:No new deficit  Skin: No rashes  Psych: Normal affect and demeanor, alert and oriented x3    Data Reviewed:  I have personally reviewed following labs and imaging studies  Micro Results Recent Results (from the past 240 hour(s))  Culture, blood (Routine X 2) w Reflex to ID Panel     Status: Abnormal   Collection Time: 05/03/16  5:11 AM  Result Value Ref Range Status   Specimen Description BLOOD RIGHT ANTECUBITAL  Final   Special Requests BOTTLES DRAWN AEROBIC ONLY 5CC  Final   Culture  Setup Time   Final    GRAM NEGATIVE RODS AEROBIC BOTTLE ONLY CRITICAL RESULT CALLED TO, READ BACK BY AND VERIFIED WITH: J. LEDFORD, PHARMD AT 0745 ON 05/04/16 BY C. JESSUP, MLT.    Culture (A)  Final    KLEBSIELLA PNEUMONIAE SUSCEPTIBILITIES PERFORMED ON PREVIOUS CULTURE WITHIN THE LAST 5 DAYS.    Report Status 05/06/2016 FINAL  Final  Culture, blood (Routine X 2) w Reflex to ID Panel     Status: Abnormal   Collection Time: 05/03/16  6:15 AM  Result Value Ref Range Status   Specimen Description BLOOD LEFT ANTECUBITAL  Final   Special Requests BOTTLES DRAWN AEROBIC ONLY 5CC  Final   Culture  Setup Time   Final    AEROBIC BOTTLE ONLY GRAM NEGATIVE RODS CRITICAL RESULT CALLED TO, READ BACK BY AND VERIFIED WITH: J. LEDFORD, PHARMD AT 0745 ON 05/04/16 BY C. JESSUP, MLT.    Culture  KLEBSIELLA PNEUMONIAE (A)  Final   Report Status 05/06/2016 FINAL  Final   Organism ID, Bacteria KLEBSIELLA PNEUMONIAE  Final      Susceptibility   Klebsiella pneumoniae - MIC*    AMPICILLIN >=32 RESISTANT Resistant     CEFAZOLIN <=4 SENSITIVE Sensitive     CEFEPIME <=1 SENSITIVE Sensitive  CEFTAZIDIME <=1 SENSITIVE Sensitive     CEFTRIAXONE <=1 SENSITIVE Sensitive     CIPROFLOXACIN <=0.25 SENSITIVE Sensitive     GENTAMICIN <=1 SENSITIVE Sensitive     IMIPENEM <=0.25 SENSITIVE Sensitive     TRIMETH/SULFA <=20 SENSITIVE Sensitive     AMPICILLIN/SULBACTAM 4 SENSITIVE Sensitive     PIP/TAZO <=4 SENSITIVE Sensitive     Extended ESBL NEGATIVE Sensitive     * KLEBSIELLA PNEUMONIAE  Blood Culture ID Panel (Reflexed)     Status: Abnormal   Collection Time: 05/03/16  6:15 AM  Result Value Ref Range Status   Enterococcus species NOT DETECTED NOT DETECTED Final   Vancomycin resistance NOT DETECTED NOT DETECTED Final   Listeria monocytogenes NOT DETECTED NOT DETECTED Final   Staphylococcus species NOT DETECTED NOT DETECTED Final   Staphylococcus aureus NOT DETECTED NOT DETECTED Final   Methicillin resistance NOT DETECTED NOT DETECTED Final   Streptococcus species NOT DETECTED NOT DETECTED Final   Streptococcus agalactiae NOT DETECTED NOT DETECTED Final   Streptococcus pneumoniae NOT DETECTED NOT DETECTED Final   Streptococcus pyogenes NOT DETECTED NOT DETECTED Final   Acinetobacter baumannii NOT DETECTED NOT DETECTED Final   Enterobacteriaceae species DETECTED (A) NOT DETECTED Final    Comment: CRITICAL RESULT CALLED TO, READ BACK BY AND VERIFIED WITH: J. LEDFORD, PHARMD AT 0745 ON 05/04/16 BY C. JESSUP, MLT.    Enterobacter cloacae complex NOT DETECTED NOT DETECTED Final   Escherichia coli NOT DETECTED NOT DETECTED Final   Klebsiella oxytoca NOT DETECTED NOT DETECTED Final   Klebsiella pneumoniae DETECTED (A) NOT DETECTED Final    Comment: CRITICAL RESULT CALLED TO, READ BACK BY AND  VERIFIED WITH: J. LEDFORD, PHARMD AT 0745 ON 05/04/16 BY C. JESSUP, MLT.    Proteus species NOT DETECTED NOT DETECTED Final   Serratia marcescens NOT DETECTED NOT DETECTED Final   Carbapenem resistance NOT DETECTED NOT DETECTED Final   Haemophilus influenzae NOT DETECTED NOT DETECTED Final   Neisseria meningitidis NOT DETECTED NOT DETECTED Final   Pseudomonas aeruginosa NOT DETECTED NOT DETECTED Final   Candida albicans NOT DETECTED NOT DETECTED Final   Candida glabrata NOT DETECTED NOT DETECTED Final   Candida krusei NOT DETECTED NOT DETECTED Final   Candida parapsilosis NOT DETECTED NOT DETECTED Final   Candida tropicalis NOT DETECTED NOT DETECTED Final  Urine culture     Status: None   Collection Time: 05/04/16 10:30 AM  Result Value Ref Range Status   Specimen Description URINE, CLEAN CATCH  Final   Special Requests NONE  Final   Culture NO GROWTH  Final   Report Status 05/05/2016 FINAL  Final  Culture, blood (Routine X 2) w Reflex to ID Panel     Status: None (Preliminary result)   Collection Time: 05/05/16  5:25 AM  Result Value Ref Range Status   Specimen Description BLOOD RIGHT ANTECUBITAL  Final   Special Requests IN PEDIATRIC BOTTLE 0.5CC  Final   Culture NO GROWTH 2 DAYS  Final   Report Status PENDING  Incomplete    Radiology Reports Ct Abdomen Pelvis W Contrast  Result Date: 05/03/2016 CLINICAL DATA:  50 year old male with pancreatitis. Follow-up scan. History of Whipple's procedure. EXAM: CT ABDOMEN AND PELVIS WITH CONTRAST TECHNIQUE: Multidetector CT imaging of the abdomen and pelvis was performed using the standard protocol following bolus administration of intravenous contrast. CONTRAST:  1 ISOVUE-300 IOPAMIDOL (ISOVUE-300) INJECTION 61% COMPARISON:  CT dated 03/07/2016 FINDINGS: O the visualized lung bases are clear. No intra-abdominal free air or  free fluid. There is mild apparent diffuse fatty infiltration of the liver. Small subcapsular areas of hypodensity involving  the inferior surface of the left lobe of the liver are not well characterized but may represent focal areas of subcapsular edema, small hepatic contusions or laceration. There is no hematoma or fluid collection. No contrast extravasation. Cholecystectomy. There is postsurgical changes of the elbow with resection of the head of the pancreas. There has been interval improvement of the previously seen inflamed appearance of the pancreas with residual inflammatory changes in the peripancreatic fat. No drainable fluid collection/abscess or pseudocyst identified. The spleen, adrenal glands appear unremarkable. Small bilateral renal hypodensities, likely cysts. There is no hydronephrosis on either side. The visualized ureters and urinary bladder appear unremarkable. The prostate and seminal vesicles are grossly unremarkable. There is gastrojejunostomy which appears patent. There is no evidence of bowel obstruction or active inflammation. There is moderate stool throughout the colon. Normal appendix. There is mild aortoiliac atherosclerotic disease. The origins of the celiac axis, SMA, IMA as well as the origins of the renal arteries are patent. The SMV, and main portal vein appear patent. There is segmental narrowing of the splenic vein, likely related to mass effect and compression by the pancreatic edema. The splenic vein however remains patent. No portal venous gas identified. There is no adenopathy. The abdominal wall soft tissues appear unremarkable. There is degenerative changes of the spine. No acute fracture. IMPRESSION: Diffuse pancreatic edema with interval improvement compared to prior study. No peripancreatic collection, abscess or pseudocyst. Postsurgical changes of Whipple's procedure. No evidence of bowel obstruction. Focal subcapsular areas of lower attenuation along the undersurface of the left lobe of the liver are not well characterized but may represent edema, contusion, or laceration. No hematoma or  fluid collection. Electronically Signed   By: Elgie Collard M.D.   On: 05/03/2016 00:57   Mr Foot Right W Wo Contrast  Result Date: 05/07/2016 CLINICAL DATA:  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 Bilateral foot calluses. EXAM: MRI OF THE RIGHT FOREFOOT WITHOUT AND WITH CONTRAST TECHNIQUE: Multiplanar, multisequence MR imaging was performed both before and after administration of intravenous contrast. CONTRAST:  14mL MULTIHANCE GADOBENATE DIMEGLUMINE 529 MG/ML IV SOLN COMPARISON:  03/07/2016 radiographs. FINDINGS: Study includes most of the foot. The distal toes and calcaneal tuberosity are incompletely visualized. Bones/Joint/Cartilage No evidence of acute fracture, dislocation or osteomyelitis. There is a well-circumscribed lucent lesion involving the head of the fifth proximal phalanx, unchanged from recent radiographs. This has a benign, nonaggressive appearance. No significant joint effusions. The alignment is normal at the Lisfranc joint. Ligaments The Lisfranc ligament is intact. Muscles and Tendons Mild nonspecific T2 hyperintensity within the plantar foot musculature, possibly mild myopathy. No focal fluid collection. The ankle tendons appear intact. No significant tenosynovitis. Soft tissues Apparent small areas of soft tissue ulceration along the plantar aspects of the forefoot at the levels of the first and fourth metatarsal heads. No surrounding inflammation, definite foreign body or focal fluid collection. IMPRESSION: 1. Nonspecific forefoot soft tissue ulceration without evidence of abscess, tenosynovitis or septic joint. 2. No evidence of osteomyelitis. 3. Nonaggressive appearing lucent lesion in the fifth proximal phalanx, likely incidental. Electronically Signed   By: Carey Bullocks M.D.   On: 05/07/2016 15:17   Mr Foot Left W Wo Contrast  Result Date: 05/07/2016 CLINICAL DATA:  Foot skin lesions. EXAM: MRI OF THE LEFT FOREFOOT WITHOUT AND WITH CONTRAST  TECHNIQUE: Multiplanar, multisequence MR imaging was performed both before and  after administration of intravenous contrast. CONTRAST:  15 cc MultiHance COMPARISON:  03/08/2016 FINDINGS: Skin lesions on the plantar aspect of the foot are marked. I do not see a discrete mass, abscess or cellulitis. No findings for septic arthritis or osteomyelitis. There is patchy myositis involving the short flexor muscles of the foot. There is moderate edema like signal abnormality in and around the adductor digiti minimi muscle and back along its combination to the plantar fascia. The major ligaments and tendons are intact. Mild tenosynovitis involving the peroneus longus tendon. IMPRESSION: 1. Nonspecific myositis in the short flexor muscles of the foot, mainly the adductor digiti minimi and adductor hallucis muscles. No findings for pyomyositis. 2. No findings for cellulitis, soft tissue abscess, osteomyelitis or septic arthritis. Electronically Signed   By: Rudie Meyer M.D.   On: 05/07/2016 15:20    Lab Data:  CBC:  Recent Labs Lab 05/02/16 2142 05/04/16 0651 05/05/16 0724 05/07/16 0514 05/08/16 0525  WBC 14.3* 8.3 6.3 4.8 5.7  NEUTROABS 11.7*  --   --   --   --   HGB 14.8 11.0* 11.6* 11.5* 11.0*  HCT 44.2 34.9* 35.7* 36.0* 34.9*  MCV 84.2 84.5 83.4 84.5 84.3  PLT 270 167 152 147* 155   Basic Metabolic Panel:  Recent Labs Lab 05/02/16 2142 05/03/16 1337 05/04/16 0651 05/05/16 0539 05/05/16 0724 05/07/16 0514  NA 134*  --  133* 134* 132* 137  K 4.9  --  3.5 3.3* 3.1* 3.6  CL 96*  --  98* 99* 98* 103  CO2 20*  --  25 28 26 24   GLUCOSE 91  --  99 114* 119* 84  BUN 9  --  <5* <5* <5* <5*  CREATININE 1.00  --  0.78 0.76 0.64 0.66  CALCIUM 9.8  --  8.8* 8.9 8.8* 8.7*  MG  --  1.6*  --   --   --   --   PHOS  --  2.0*  --   --   --   --    GFR: Estimated Creatinine Clearance: 105.9 mL/min (by C-G formula based on SCr of 0.8 mg/dL). Liver Function Tests:  Recent Labs Lab 05/02/16 2142  05/04/16 0651 05/05/16 0724  AST 28 40 46*  ALT 24 22 30   ALKPHOS 156* 106 105  BILITOT 1.5* 1.4* 0.8  PROT 7.9 6.0* 5.6*  ALBUMIN 4.4 3.1* 2.9*    Recent Labs Lab 05/02/16 2142  LIPASE 25   No results for input(s): AMMONIA in the last 168 hours. Coagulation Profile: No results for input(s): INR, PROTIME in the last 168 hours. Cardiac Enzymes: No results for input(s): CKTOTAL, CKMB, CKMBINDEX, TROPONINI in the last 168 hours. BNP (last 3 results) No results for input(s): PROBNP in the last 8760 hours. HbA1C: No results for input(s): HGBA1C in the last 72 hours. CBG: No results for input(s): GLUCAP in the last 168 hours. Lipid Profile: No results for input(s): CHOL, HDL, LDLCALC, TRIG, CHOLHDL, LDLDIRECT in the last 72 hours. Thyroid Function Tests: No results for input(s): TSH, T4TOTAL, FREET4, T3FREE, THYROIDAB in the last 72 hours. Anemia Panel: No results for input(s): VITAMINB12, FOLATE, FERRITIN, TIBC, IRON, RETICCTPCT in the last 72 hours. Urine analysis:    Component Value Date/Time   COLORURINE YELLOW 05/03/2016 0607   APPEARANCEUR CLEAR 05/03/2016 0607   LABSPEC 1.040 (H) 05/03/2016 0607   PHURINE 5.0 05/03/2016 0607   GLUCOSEU NEGATIVE 05/03/2016 0607   HGBUR NEGATIVE 05/03/2016 0607   BILIRUBINUR NEGATIVE 05/03/2016  8295   KETONESUR >80 (A) 05/03/2016 0607   PROTEINUR NEGATIVE 05/03/2016 0607   UROBILINOGEN 1.0 06/23/2015 1513   NITRITE NEGATIVE 05/03/2016 0607   LEUKOCYTESUR NEGATIVE 05/03/2016 6213     Kysen Wetherington M.D. [facility] 05/08/2016, 2:53 PM  Pager: [phone] Between 7am to 7pm - call Pager - [phone]  After 7pm go to [email or web address] - password [email or web address]  Call night coverage person covering after 7pm

## 2016-05-08 NOTE — Consult Note (Addendum)
[place] Nurse wound consult note Reason for Consult: Consult requested for bilt feet.  Pt has dry callous areas to left and right inner and outer aspects of his plantar feet. MRI did not indicate osteomyelitis or fluid collection to BLE Wound type: There are no open wounds, drainage, odor, or fluctuance to the callous areas Measurement: Each site is approx .3X.3 cm, with a punched out appearance in the center but it is not an open wound, it appears to be the callous has built around the skin level, creating a "crater" appearance Periwound: Attempted to trim callous to skin level but they are so hard that no progress was made.  Bactroban to provide antimicrobial benefits and provide moisture. Callous areas are high risk to develop cracks which can contribute to the occurrence of an infection. Recommend pt obtain follow-up with a podiatrist. He verbalized understanding. Please re-consult if further assistance is needed.  Thank-you,  Julien Girt MSN, [place], [place], [place], [place]

## 2016-05-08 NOTE — Care Management Note (Addendum)
Case Management Note  Patient Details  Name: Brent Rogers MRN: [id number] Date of Birth: 1966-08-02  Subjective/Objective:          Admitted with pancreatits, PNA Klebsiella bacteremia. From home with family (sisiter/nephew). Independent with ADL's PTA, no DME usage.  PCP: Dr. Cala Bradford        Action/Plan: Plan is to return home when medically stable assuming care for self , however if home health services are needed @ d/c pt has selected [facility] as the provider.  05/09/2016 - post hospital f/u appointment scheduled with Dr.Funchus on 05/19/2016 @ 2pm. Pt made aware per CM.  Expected Discharge Date:                  Expected Discharge Plan:  Home/Self Care  In-House Referral:     Discharge planning Services  CM Consult  Post Acute Care Choice:    Choice offered to:  Patient  DME Arranged:    DME Agency:  [place]:    [facility] Agency:  [place]  Status of Service:  In process, will continue to follow  If discussed at Long Length of Stay Meetings, dates discussed:    Additional Comments:  Sharin Mons, RN 05/08/2016, 9:21 AM

## 2016-05-08 NOTE — Progress Notes (Signed)
Nutrition Follow-up  DOCUMENTATION CODES:   Severe malnutrition in context of chronic illness  INTERVENTION:   -30 ml Prostat TID, each supplement provides 100 kcals and 15 grams of protein  NUTRITION DIAGNOSIS:   Malnutrition related to chronic illness as evidenced by severe depletion of body fat, severe depletion of muscle mass.  Progressing  GOAL:   Patient will meet greater than or equal to 90% of their needs  Progressing  MONITOR:   PO intake, Supplement acceptance, Labs, Weight trends, Skin, I & O's  REASON FOR ASSESSMENT:   Consult Wound healing  ASSESSMENT:   50 y.o. male with medical history significant for prior Whipple procedure for pancreatic mass in 2015, ongoing intermittent alcohol use and ongoing tobacco abuse, hypertension, severe protein calorie malnutrition, reflux, fatty liver disease. Patient has a history of frequent hospital admissions for exacerbation of pancreatitis manifesting as abdominal pain w/ nausea and vomiting. Patient was recently discharged on 6/13 after similar episode. At that time it was surmised that recent alcohol ingestion precipitated episode.  Spoke with pt at bedside, who reports he is feeling much better. He reveals that his appetite has improved over the past few days. Meal completion 15-100%.   Reviewed CWOCN note; pt with callouses on bilateral feet but no open areas.   Pt interested in nutritional supplements, however, disllikes Boost Breeze and Ensure. He has tried Prostat in the past and is amenable to try to optimize nutritional intake. RD to order.   Medications reviewed and include folvite, MVI, and B-1.   Labs reviewed.   Diet Order:  Diet Heart Room service appropriate? Yes; Fluid consistency: Thin  Skin:  Reviewed, no issues (dry callouses on bil feet)  Last BM:  05/06/16  Height:   Ht Readings from Last 1 Encounters:  05/03/16 6\' 3"  (1.905 m)    Weight:   Wt Readings from Last 1 Encounters:  05/03/16  149 lb 6.4 oz (67.8 kg)    Ideal Body Weight:  89.09 kg  BMI:  Body mass index is 18.67 kg/m.  Estimated Nutritional Needs:   Kcal:  2050-2260 (30-33 kcal/kg)  Protein:  100-110 grams  Fluid:  >/= 2 L/day  EDUCATION NEEDS:   Education needs addressed  Thadius Smisek A. Jimmye Norman, RD, LDN, CDE Pager: [phone] After hours Pager: [phone]

## 2016-05-08 NOTE — Progress Notes (Signed)
Subjective: No new complaints   Antibiotics:  Anti-infectives    Start     Dose/Rate Route Frequency Ordered Stop   05/09/16 1000  amoxicillin-clavulanate (AUGMENTIN) 875-125 MG per tablet 1 tablet     1 tablet Oral Every 12 hours 05/08/16 1304 05/18/16 0959   05/04/16 0900  cefTRIAXone (ROCEPHIN) 2 g in dextrose 5 % 50 mL IVPB  Status:  Discontinued     2 g 100 mL/hr over 30 Minutes Intravenous Every 24 hours 05/04/16 0752 05/08/16 1304      Medications: Scheduled Meds: . [START ON 05/09/2016] amoxicillin-clavulanate  1 tablet Oral Q12H  . enoxaparin (LOVENOX) injection  40 mg Subcutaneous Q24H  . folic acid  1 mg Oral Daily  . multivitamin with minerals  1 tablet Oral Daily  . mupirocin cream   Topical Daily  . pantoprazole  40 mg Oral Daily  . thiamine  100 mg Oral Daily   Continuous Infusions: . sodium chloride 125 mL/hr at 05/08/16 0816   PRN Meds:.acetaminophen **OR** acetaminophen, HYDROcodone-acetaminophen, ketorolac, ondansetron (ZOFRAN) IV **OR** promethazine    Objective: Weight change:   Intake/Output Summary (Last 24 hours) at 05/08/16 1556 Last data filed at 05/08/16 1506  Gross per 24 hour  Intake          3659.58 ml  Output             2425 ml  Net          1234.58 ml   Blood pressure 129/87, pulse (!) 52, temperature 98.8 F (37.1 C), temperature source Oral, resp. rate 16, height 6\' 3"  (1.905 m), weight 149 lb 6.4 oz (67.8 kg), SpO2 100 %. Temp:  [97.9 F (36.6 C)-98.8 F (37.1 C)] 98.8 F (37.1 C) (08/10 1320) Pulse Rate:  [48-55] 52 (08/10 1320) Resp:  [16-18] 16 (08/10 1320) BP: (121-136)/(81-92) 129/87 (08/10 1320) SpO2:  [100 %] 100 % (08/10 1320)  Physical Exam: General: Alert and awake, oriented x3, not in any acute distress. HEENT: anicteric sclera,  EOMI, oropharynx clear and without exudate Cardiovascular: egular rate, normal r,  no murmur rubs or gallops Pulmonary: clear to auscultation bilaterally, no wheezing, rales  or rhonchi Gastrointestinal: soft TTP in left side, nondistended, normal bowel sounds, Musculoskeletal: Skin: see pictures from yesterday Neuro: nonfocal  CBC:  CBC Latest Ref Rng & Units 05/08/2016 05/07/2016 05/05/2016  WBC 4.0 - 10.5 K/uL 5.7 4.8 6.3  Hemoglobin 13.0 - 17.0 g/dL 11.0(L) 11.5(L) 11.6(L)  Hematocrit 39.0 - 52.0 % 34.9(L) 36.0(L) 35.7(L)  Platelets 150 - 400 K/uL 155 147(L) 152     BMET  Recent Labs  05/07/16 0514  NA 137  K 3.6  CL 103  CO2 24  GLUCOSE 84  BUN <5*  CREATININE 0.66  CALCIUM 8.7*     Liver Panel  No results for input(s): PROT, ALBUMIN, AST, ALT, ALKPHOS, BILITOT, BILIDIR, IBILI in the last 72 hours.     Sedimentation Rate  Recent Labs  05/08/16 0525  ESRSEDRATE 18*   C-Reactive Protein  Recent Labs  05/08/16 0525  CRP 1.0*    Micro Results: Recent Results (from the past 720 hour(s))  Culture, blood (Routine X 2) w Reflex to ID Panel     Status: Abnormal   Collection Time: 05/03/16  5:11 AM  Result Value Ref Range Status   Specimen Description BLOOD RIGHT ANTECUBITAL  Final   Special Requests BOTTLES DRAWN AEROBIC ONLY 5CC  Final   Culture  Setup Time   Final    GRAM NEGATIVE RODS AEROBIC BOTTLE ONLY CRITICAL RESULT CALLED TO, READ BACK BY AND VERIFIED WITH: J. LEDFORD, PHARMD AT 0745 ON 05/04/16 BY C. JESSUP, MLT.    Culture (A)  Final    KLEBSIELLA PNEUMONIAE SUSCEPTIBILITIES PERFORMED ON PREVIOUS CULTURE WITHIN THE LAST 5 DAYS.    Report Status 05/06/2016 FINAL  Final  Culture, blood (Routine X 2) w Reflex to ID Panel     Status: Abnormal   Collection Time: 05/03/16  6:15 AM  Result Value Ref Range Status   Specimen Description BLOOD LEFT ANTECUBITAL  Final   Special Requests BOTTLES DRAWN AEROBIC ONLY 5CC  Final   Culture  Setup Time   Final    AEROBIC BOTTLE ONLY GRAM NEGATIVE RODS CRITICAL RESULT CALLED TO, READ BACK BY AND VERIFIED WITH: J. LEDFORD, PHARMD AT 0745 ON 05/04/16 BY C. JESSUP, MLT.    Culture  KLEBSIELLA PNEUMONIAE (A)  Final   Report Status 05/06/2016 FINAL  Final   Organism ID, Bacteria KLEBSIELLA PNEUMONIAE  Final      Susceptibility   Klebsiella pneumoniae - MIC*    AMPICILLIN >=32 RESISTANT Resistant     CEFAZOLIN <=4 SENSITIVE Sensitive     CEFEPIME <=1 SENSITIVE Sensitive     CEFTAZIDIME <=1 SENSITIVE Sensitive     CEFTRIAXONE <=1 SENSITIVE Sensitive     CIPROFLOXACIN <=0.25 SENSITIVE Sensitive     GENTAMICIN <=1 SENSITIVE Sensitive     IMIPENEM <=0.25 SENSITIVE Sensitive     TRIMETH/SULFA <=20 SENSITIVE Sensitive     AMPICILLIN/SULBACTAM 4 SENSITIVE Sensitive     PIP/TAZO <=4 SENSITIVE Sensitive     Extended ESBL NEGATIVE Sensitive     * KLEBSIELLA PNEUMONIAE  Blood Culture ID Panel (Reflexed)     Status: Abnormal   Collection Time: 05/03/16  6:15 AM  Result Value Ref Range Status   Enterococcus species NOT DETECTED NOT DETECTED Final   Vancomycin resistance NOT DETECTED NOT DETECTED Final   Listeria monocytogenes NOT DETECTED NOT DETECTED Final   Staphylococcus species NOT DETECTED NOT DETECTED Final   Staphylococcus aureus NOT DETECTED NOT DETECTED Final   Methicillin resistance NOT DETECTED NOT DETECTED Final   Streptococcus species NOT DETECTED NOT DETECTED Final   Streptococcus agalactiae NOT DETECTED NOT DETECTED Final   Streptococcus pneumoniae NOT DETECTED NOT DETECTED Final   Streptococcus pyogenes NOT DETECTED NOT DETECTED Final   Acinetobacter baumannii NOT DETECTED NOT DETECTED Final   Enterobacteriaceae species DETECTED (A) NOT DETECTED Final    Comment: CRITICAL RESULT CALLED TO, READ BACK BY AND VERIFIED WITH: J. LEDFORD, PHARMD AT 0745 ON 05/04/16 BY C. JESSUP, MLT.    Enterobacter cloacae complex NOT DETECTED NOT DETECTED Final   Escherichia coli NOT DETECTED NOT DETECTED Final   Klebsiella oxytoca NOT DETECTED NOT DETECTED Final   Klebsiella pneumoniae DETECTED (A) NOT DETECTED Final    Comment: CRITICAL RESULT CALLED TO, READ BACK BY AND  VERIFIED WITH: J. LEDFORD, PHARMD AT 0745 ON 05/04/16 BY C. JESSUP, MLT.    Proteus species NOT DETECTED NOT DETECTED Final   Serratia marcescens NOT DETECTED NOT DETECTED Final   Carbapenem resistance NOT DETECTED NOT DETECTED Final   Haemophilus influenzae NOT DETECTED NOT DETECTED Final   Neisseria meningitidis NOT DETECTED NOT DETECTED Final   Pseudomonas aeruginosa NOT DETECTED NOT DETECTED Final   Candida albicans NOT DETECTED NOT DETECTED Final   Candida glabrata NOT DETECTED NOT DETECTED Final   Candida krusei NOT DETECTED NOT  DETECTED Final   Candida parapsilosis NOT DETECTED NOT DETECTED Final   Candida tropicalis NOT DETECTED NOT DETECTED Final  Urine culture     Status: None   Collection Time: 05/04/16 10:30 AM  Result Value Ref Range Status   Specimen Description URINE, CLEAN CATCH  Final   Special Requests NONE  Final   Culture NO GROWTH  Final   Report Status 05/05/2016 FINAL  Final  Culture, blood (Routine X 2) w Reflex to ID Panel     Status: None (Preliminary result)   Collection Time: 05/05/16  5:25 AM  Result Value Ref Range Status   Specimen Description BLOOD RIGHT ANTECUBITAL  Final   Special Requests IN PEDIATRIC BOTTLE 0.5CC  Final   Culture NO GROWTH 2 DAYS  Final   Report Status PENDING  Incomplete    Studies/Results: Mr Foot Right W Wo Contrast  Result Date: 05/07/2016 CLINICAL DATA:  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 Bilateral foot calluses. EXAM: MRI OF THE RIGHT FOREFOOT WITHOUT AND WITH CONTRAST TECHNIQUE: Multiplanar, multisequence MR imaging was performed both before and after administration of intravenous contrast. CONTRAST:  42mL MULTIHANCE GADOBENATE DIMEGLUMINE 529 MG/ML IV SOLN COMPARISON:  03/07/2016 radiographs. FINDINGS: Study includes most of the foot. The distal toes and calcaneal tuberosity are incompletely visualized. Bones/Joint/Cartilage No evidence of acute fracture, dislocation or osteomyelitis. There is  a well-circumscribed lucent lesion involving the head of the fifth proximal phalanx, unchanged from recent radiographs. This has a benign, nonaggressive appearance. No significant joint effusions. The alignment is normal at the Lisfranc joint. Ligaments The Lisfranc ligament is intact. Muscles and Tendons Mild nonspecific T2 hyperintensity within the plantar foot musculature, possibly mild myopathy. No focal fluid collection. The ankle tendons appear intact. No significant tenosynovitis. Soft tissues Apparent small areas of soft tissue ulceration along the plantar aspects of the forefoot at the levels of the first and fourth metatarsal heads. No surrounding inflammation, definite foreign body or focal fluid collection. IMPRESSION: 1. Nonspecific forefoot soft tissue ulceration without evidence of abscess, tenosynovitis or septic joint. 2. No evidence of osteomyelitis. 3. Nonaggressive appearing lucent lesion in the fifth proximal phalanx, likely incidental. Electronically Signed   By: Richardean Sale M.D.   On: 05/07/2016 15:17   Mr Foot Left W Wo Contrast  Result Date: 05/07/2016 CLINICAL DATA:  Foot skin lesions. EXAM: MRI OF THE LEFT FOREFOOT WITHOUT AND WITH CONTRAST TECHNIQUE: Multiplanar, multisequence MR imaging was performed both before and after administration of intravenous contrast. CONTRAST:  15 cc MultiHance COMPARISON:  03/08/2016 FINDINGS: Skin lesions on the plantar aspect of the foot are marked. I do not see a discrete mass, abscess or cellulitis. No findings for septic arthritis or osteomyelitis. There is patchy myositis involving the short flexor muscles of the foot. There is moderate edema like signal abnormality in and around the adductor digiti minimi muscle and back along its combination to the plantar fascia. The major ligaments and tendons are intact. Mild tenosynovitis involving the peroneus longus tendon. IMPRESSION: 1. Nonspecific myositis in the short flexor muscles of the foot,  mainly the adductor digiti minimi and adductor hallucis muscles. No findings for pyomyositis. 2. No findings for cellulitis, soft tissue abscess, osteomyelitis or septic arthritis. Electronically Signed   By: Marijo Sanes M.D.   On: 05/07/2016 15:20      Assessment/Plan:  INTERVAL HISTORY: MRI of both feet without evidence of deep infection   Principal Problem:   Acute on chronic pancreatitis (HCC) Active Problems:  Protein-calorie malnutrition, severe (HCC)   Tobacco abuse   HTN (hypertension)   Fatty liver   GERD (gastroesophageal reflux disease)   Intractable nausea and vomiting   Alcohol abuse   Bacteremia   Foot ulcer with fat layer exposed ([place])   Foot ulcer, right (HCC)    Brent Rogers is a 50 y.o. male with  Hx of Whipples' for pancreatic mass, intermittent etoh use admitted for pancreatitis but found to have Klebsiella PNA bacteremia  #1 Klebsiella pneumonia bacteremia:  His MRI feet not show deep infection. Source of his bacteremia is not clear. Perhaps it could be related to his pancreatitis though he did not have intra-abdominal abscess. Endocarditis would be highly unusual.  I would change him over to Augmentin tomorrow and have him complete a total of 14 days antibiotic therapy including his time on ceftriaxone.  #2 Foot ulcers I've asked [facility] to help provide appropriate footwear to offload these and he has been seen by wound care  He needs to be established in health and wellness  I would be happy to see him in clinic in the next 1-2 months  I will sign off for now  Please call with further questions.    LOS: 4 days   Alcide Evener 05/08/2016, 3:56 PM

## 2016-05-09 LAB — HCV COMMENT:

## 2016-05-09 LAB — HEMOGLOBIN A1C
Hgb A1c MFr Bld: 5.6 % (ref 4.8–5.6)
Mean Plasma Glucose: 114 mg/dL

## 2016-05-09 LAB — HEPATITIS C ANTIBODY (REFLEX): HCV Ab: <0.1 s/co ratio (ref 0.0–0.9)

## 2016-05-09 LAB — HEPATITIS B SURFACE ANTIGEN: Hepatitis B Surface Ag: NEGATIVE

## 2016-05-09 MED ORDER — OXYCODONE-ACETAMINOPHEN 5-325 MG PO TABS: 1.0000 | ORAL_TABLET | Freq: Three times a day (TID) | ORAL | 0 refills | Status: DC | PRN

## 2016-05-09 MED ORDER — AMOXICILLIN-POT CLAVULANATE 875-125 MG PO TABS: 1.0000 | ORAL_TABLET | Freq: Two times a day (BID) | ORAL | 0 refills | Status: DC

## 2016-05-09 NOTE — Discharge Summary (Signed)
Physician Discharge Summary  Brent Rogers [id number] DOB: October 23, 1965 DOA: 05/02/2016  PCP: Minerva Ends, MD  Admit date: 05/02/2016 Discharge date: 05/09/2016  Time spent: 35 minutes  Recommendations for Outpatient Follow-up:  1. PCP Dr.FUnches at [facility] clinic on 8/21   Discharge Diagnoses:  Principal Problem:   Acute on chronic pancreatitis ([place]) Active Problems:   Protein-calorie malnutrition, severe (HCC)   Tobacco abuse   HTN (hypertension)   Fatty liver   GERD (gastroesophageal reflux disease)   Emesis   Alcohol abuse   Bacteremia   Foot ulcer with fat layer exposed ([place])   Foot ulcer, right ([place])   Klebsiella infection   Discharge Condition: stable  Diet recommendation: low fat  Filed Weights   05/03/16 1007  Weight: 67.8 kg (149 lb 6.4 oz)    History of present illness:  Brent Rogers is a 50 y.o. male with medical history significant for prior Whipple procedure for pancreatic mass in 2015, ongoing intermittent alcohol use and ongoing tobacco abuse, hypertension, severe protein calorie malnutrition, reflux, fatty liver disease. Patient has a history of frequent hospital admissions for exacerbation of pancreatitis manifesting as abdominal pain w/ nausea and vomiting.Pt presented with acute onset of abdominal pain starting yesterday associated with nausea and vomiting but no diarrhea. The pain is primarily located in the left middle quadrant radiating down to the left lower quadrant. Patient admits to drinking "several beers" the day before for his brother's birthday  Hospital Course:  Acute on chronic pancreatitis  -recent alcohol ingestion -CT scan shows improvement pancreatic and small bowel edema from previous episode of pancreatitis in June -treated with supportive care, bowel rest, tolerating low fat diet at this time -EToh cessation counseled  Klebsiella bacteremia -source unclear, from blood Cx on 8/5,drawn on admission for mild  leukocytosis, repeat blood cultures were negative, no fevers/WBC was up on admission -completed 5days of IV Rocephin -Urine Cx negative, Ct abd without abscess/pseudocyst apart from pancreatic edema -ID consulted, seen by Dr.Van Dam he ordered MRI of pts feets due to h/o foot ulcers and callouses these were negative for sources of infection, at this time pt is stable, afebrile, non toxic and hence we discharged him on Oral augmentin to complete a total 2 weeks of Abx including Ceftriaxone received inpatient.  Fatty liver -Secondary to ongoing alcohol use -counseled  GERD (gastroesophageal reflux disease) -Protonix  Alcohol abuse -Continue CIWA with Ativan -no overt withdrawal  Protein-calorie malnutrition, severe -Patient reports significant weight loss since Whipple procedure in 2015, pre-albumin 12.2 -Nutrition consultation -HIV negative 1/17  Tobacco abuse -encouraged cessation  Consultations: ID Dr.Van Dam  Discharge Exam: Vitals:   05/09/16 0008 05/09/16 0516  BP: (!) 149/88 (!) 152/92  Pulse: (!) 57 (!) 53  Resp: 18 18  Temp: 97.8 F (36.6 C) 98 F (36.7 C)    General: AAOx3 Cardiovascular: S1S2/RRR Respiratory: CTAB  Discharge Instructions   Discharge Instructions    Diet - low sodium heart healthy    Complete by:  As directed   Increase activity slowly    Complete by:  As directed     Discharge Medication List as of 05/09/2016 10:28 AM    START taking these medications   Details  amoxicillin-clavulanate (AUGMENTIN) 875-125 MG tablet Take 1 tablet by mouth every 12 (twelve) hours. For 7days, Starting Fri 05/09/2016, Print      CONTINUE these medications which have CHANGED   Details  oxyCODONE-acetaminophen (PERCOCET/ROXICET) 5-325 MG tablet Take 1-2 tablets by mouth every  8 (eight) hours as needed for moderate pain or severe pain., Starting Fri 05/09/2016, Print      CONTINUE these medications which have NOT CHANGED   Details   acetaminophen (TYLENOL) 500 MG tablet Take 1 tablet (500 mg total) by mouth every 6 (six) hours as needed for moderate pain (stomach pain)., Starting 03/11/2016, Until Discontinued, Normal    amLODipine (NORVASC) 5 MG tablet Take 1 tablet (5 mg total) by mouth daily., Starting 12/25/2015, Until Discontinued, Normal    Pancrelipase, Lip-Prot-Amyl, 24000 units CPEP Take 1 capsule (24,000 Units total) by mouth at bedtime., Starting 10/15/2015, Until Discontinued, Print    pantoprazole (PROTONIX) 40 MG tablet Take 1 tablet (40 mg total) by mouth daily., Starting 03/11/2016, Until Discontinued, Print    promethazine (PHENERGAN) 25 MG tablet Take 1 tablet (25 mg total) by mouth every 8 (eight) hours as needed for nausea or vomiting., Starting 03/11/2016, Until Discontinued, Print      STOP taking these medications     bismuth subsalicylate (PEPTO BISMOL) 262 MG/15ML suspension        No Known Allergies Follow-up Information    [place]. Go on 05/19/2016.   Why:   Post hospital follow up scheduled for 05/19/2016 with Dr.Funchus at 2pm. Contact information: [address] [place] [phone]           The results of significant diagnostics from this hospitalization (including imaging, microbiology, ancillary and laboratory) are listed below for reference.    Significant Diagnostic Studies: Ct Abdomen Pelvis W Contrast  Result Date: 05/03/2016 CLINICAL DATA:  50 year old male with pancreatitis. Follow-up scan. History of Whipple's procedure. EXAM: CT ABDOMEN AND PELVIS WITH CONTRAST TECHNIQUE: Multidetector CT imaging of the abdomen and pelvis was performed using the standard protocol following bolus administration of intravenous contrast. CONTRAST:  1 ISOVUE-300 IOPAMIDOL (ISOVUE-300) INJECTION 61% COMPARISON:  CT dated 03/07/2016 FINDINGS: O the visualized lung bases are clear. No intra-abdominal free air or free fluid. There  is mild apparent diffuse fatty infiltration of the liver. Small subcapsular areas of hypodensity involving the inferior surface of the left lobe of the liver are not well characterized but may represent focal areas of subcapsular edema, small hepatic contusions or laceration. There is no hematoma or fluid collection. No contrast extravasation. Cholecystectomy. There is postsurgical changes of the elbow with resection of the head of the pancreas. There has been interval improvement of the previously seen inflamed appearance of the pancreas with residual inflammatory changes in the peripancreatic fat. No drainable fluid collection/abscess or pseudocyst identified. The spleen, adrenal glands appear unremarkable. Small bilateral renal hypodensities, likely cysts. There is no hydronephrosis on either side. The visualized ureters and urinary bladder appear unremarkable. The prostate and seminal vesicles are grossly unremarkable. There is gastrojejunostomy which appears patent. There is no evidence of bowel obstruction or active inflammation. There is moderate stool throughout the colon. Normal appendix. There is mild aortoiliac atherosclerotic disease. The origins of the celiac axis, SMA, IMA as well as the origins of the renal arteries are patent. The SMV, and main portal vein appear patent. There is segmental narrowing of the splenic vein, likely related to mass effect and compression by the pancreatic edema. The splenic vein however remains patent. No portal venous gas identified. There is no adenopathy. The abdominal wall soft tissues appear unremarkable. There is degenerative changes of the spine. No acute fracture. IMPRESSION: Diffuse pancreatic edema with interval improvement compared to prior study. No peripancreatic collection,  abscess or pseudocyst. Postsurgical changes of Whipple's procedure. No evidence of bowel obstruction. Focal subcapsular areas of lower attenuation along the undersurface of the left lobe  of the liver are not well characterized but may represent edema, contusion, or laceration. No hematoma or fluid collection. Electronically Signed   By: Anner Crete M.D.   On: 05/03/2016 00:57   Mr Foot Right W Wo Contrast  Result Date: 05/07/2016 CLINICAL DATA:  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 Bilateral foot calluses. EXAM: MRI OF THE RIGHT FOREFOOT WITHOUT AND WITH CONTRAST TECHNIQUE: Multiplanar, multisequence MR imaging was performed both before and after administration of intravenous contrast. CONTRAST:  55mL MULTIHANCE GADOBENATE DIMEGLUMINE 529 MG/ML IV SOLN COMPARISON:  03/07/2016 radiographs. FINDINGS: Study includes most of the foot. The distal toes and calcaneal tuberosity are incompletely visualized. Bones/Joint/Cartilage No evidence of acute fracture, dislocation or osteomyelitis. There is a well-circumscribed lucent lesion involving the head of the fifth proximal phalanx, unchanged from recent radiographs. This has a benign, nonaggressive appearance. No significant joint effusions. The alignment is normal at the Lisfranc joint. Ligaments The Lisfranc ligament is intact. Muscles and Tendons Mild nonspecific T2 hyperintensity within the plantar foot musculature, possibly mild myopathy. No focal fluid collection. The ankle tendons appear intact. No significant tenosynovitis. Soft tissues Apparent small areas of soft tissue ulceration along the plantar aspects of the forefoot at the levels of the first and fourth metatarsal heads. No surrounding inflammation, definite foreign body or focal fluid collection. IMPRESSION: 1. Nonspecific forefoot soft tissue ulceration without evidence of abscess, tenosynovitis or septic joint. 2. No evidence of osteomyelitis. 3. Nonaggressive appearing lucent lesion in the fifth proximal phalanx, likely incidental. Electronically Signed   By: Richardean Sale M.D.   On: 05/07/2016 15:17   Mr Foot Left W Wo Contrast  Result Date:  05/07/2016 CLINICAL DATA:  Foot skin lesions. EXAM: MRI OF THE LEFT FOREFOOT WITHOUT AND WITH CONTRAST TECHNIQUE: Multiplanar, multisequence MR imaging was performed both before and after administration of intravenous contrast. CONTRAST:  15 cc MultiHance COMPARISON:  03/08/2016 FINDINGS: Skin lesions on the plantar aspect of the foot are marked. I do not see a discrete mass, abscess or cellulitis. No findings for septic arthritis or osteomyelitis. There is patchy myositis involving the short flexor muscles of the foot. There is moderate edema like signal abnormality in and around the adductor digiti minimi muscle and back along its combination to the plantar fascia. The major ligaments and tendons are intact. Mild tenosynovitis involving the peroneus longus tendon. IMPRESSION: 1. Nonspecific myositis in the short flexor muscles of the foot, mainly the adductor digiti minimi and adductor hallucis muscles. No findings for pyomyositis. 2. No findings for cellulitis, soft tissue abscess, osteomyelitis or septic arthritis. Electronically Signed   By: Marijo Sanes M.D.   On: 05/07/2016 15:20    Microbiology: Recent Results (from the past 240 hour(s))  Culture, blood (Routine X 2) w Reflex to ID Panel     Status: Abnormal   Collection Time: 05/03/16  5:11 AM  Result Value Ref Range Status   Specimen Description BLOOD RIGHT ANTECUBITAL  Final   Special Requests BOTTLES DRAWN AEROBIC ONLY 5CC  Final   Culture  Setup Time   Final    GRAM NEGATIVE RODS AEROBIC BOTTLE ONLY CRITICAL RESULT CALLED TO, READ BACK BY AND VERIFIED WITH: J. LEDFORD, PHARMD AT 0745 ON 05/04/16 BY C. JESSUP, MLT.    Culture (A)  Final    KLEBSIELLA PNEUMONIAE SUSCEPTIBILITIES PERFORMED ON  PREVIOUS CULTURE WITHIN THE LAST 5 DAYS.    Report Status 05/06/2016 FINAL  Final  Culture, blood (Routine X 2) w Reflex to ID Panel     Status: Abnormal   Collection Time: 05/03/16  6:15 AM  Result Value Ref Range Status   Specimen Description  BLOOD LEFT ANTECUBITAL  Final   Special Requests BOTTLES DRAWN AEROBIC ONLY 5CC  Final   Culture  Setup Time   Final    AEROBIC BOTTLE ONLY GRAM NEGATIVE RODS CRITICAL RESULT CALLED TO, READ BACK BY AND VERIFIED WITH: J. LEDFORD, PHARMD AT 0745 ON 05/04/16 BY C. JESSUP, MLT.    Culture KLEBSIELLA PNEUMONIAE (A)  Final   Report Status 05/06/2016 FINAL  Final   Organism ID, Bacteria KLEBSIELLA PNEUMONIAE  Final      Susceptibility   Klebsiella pneumoniae - MIC*    AMPICILLIN >=32 RESISTANT Resistant     CEFAZOLIN <=4 SENSITIVE Sensitive     CEFEPIME <=1 SENSITIVE Sensitive     CEFTAZIDIME <=1 SENSITIVE Sensitive     CEFTRIAXONE <=1 SENSITIVE Sensitive     CIPROFLOXACIN <=0.25 SENSITIVE Sensitive     GENTAMICIN <=1 SENSITIVE Sensitive     IMIPENEM <=0.25 SENSITIVE Sensitive     TRIMETH/SULFA <=20 SENSITIVE Sensitive     AMPICILLIN/SULBACTAM 4 SENSITIVE Sensitive     PIP/TAZO <=4 SENSITIVE Sensitive     Extended ESBL NEGATIVE Sensitive     * KLEBSIELLA PNEUMONIAE  Blood Culture ID Panel (Reflexed)     Status: Abnormal   Collection Time: 05/03/16  6:15 AM  Result Value Ref Range Status   Enterococcus species NOT DETECTED NOT DETECTED Final   Vancomycin resistance NOT DETECTED NOT DETECTED Final   Listeria monocytogenes NOT DETECTED NOT DETECTED Final   Staphylococcus species NOT DETECTED NOT DETECTED Final   Staphylococcus aureus NOT DETECTED NOT DETECTED Final   Methicillin resistance NOT DETECTED NOT DETECTED Final   Streptococcus species NOT DETECTED NOT DETECTED Final   Streptococcus agalactiae NOT DETECTED NOT DETECTED Final   Streptococcus pneumoniae NOT DETECTED NOT DETECTED Final   Streptococcus pyogenes NOT DETECTED NOT DETECTED Final   Acinetobacter baumannii NOT DETECTED NOT DETECTED Final   Enterobacteriaceae species DETECTED (A) NOT DETECTED Final    Comment: CRITICAL RESULT CALLED TO, READ BACK BY AND VERIFIED WITH: J. LEDFORD, PHARMD AT 0745 ON 05/04/16 BY C. JESSUP,  MLT.    Enterobacter cloacae complex NOT DETECTED NOT DETECTED Final   Escherichia coli NOT DETECTED NOT DETECTED Final   Klebsiella oxytoca NOT DETECTED NOT DETECTED Final   Klebsiella pneumoniae DETECTED (A) NOT DETECTED Final    Comment: CRITICAL RESULT CALLED TO, READ BACK BY AND VERIFIED WITH: J. LEDFORD, PHARMD AT 0745 ON 05/04/16 BY C. JESSUP, MLT.    Proteus species NOT DETECTED NOT DETECTED Final   Serratia marcescens NOT DETECTED NOT DETECTED Final   Carbapenem resistance NOT DETECTED NOT DETECTED Final   Haemophilus influenzae NOT DETECTED NOT DETECTED Final   Neisseria meningitidis NOT DETECTED NOT DETECTED Final   Pseudomonas aeruginosa NOT DETECTED NOT DETECTED Final   Candida albicans NOT DETECTED NOT DETECTED Final   Candida glabrata NOT DETECTED NOT DETECTED Final   Candida krusei NOT DETECTED NOT DETECTED Final   Candida parapsilosis NOT DETECTED NOT DETECTED Final   Candida tropicalis NOT DETECTED NOT DETECTED Final  Urine culture     Status: None   Collection Time: 05/04/16 10:30 AM  Result Value Ref Range Status   Specimen Description URINE, CLEAN CATCH  Final   Special Requests NONE  Final   Culture NO GROWTH  Final   Report Status 05/05/2016 FINAL  Final  Culture, blood (Routine X 2) w Reflex to ID Panel     Status: None (Preliminary result)   Collection Time: 05/05/16  5:25 AM  Result Value Ref Range Status   Specimen Description BLOOD RIGHT ANTECUBITAL  Final   Special Requests IN PEDIATRIC BOTTLE 0.5CC  Final   Culture NO GROWTH 4 DAYS  Final   Report Status PENDING  Incomplete     Labs: Basic Metabolic Panel:  Recent Labs Lab 05/02/16 2142 05/03/16 1337 05/04/16 0651 05/05/16 0539 05/05/16 0724 05/07/16 0514  NA 134*  --  133* 134* 132* 137  K 4.9  --  3.5 3.3* 3.1* 3.6  CL 96*  --  98* 99* 98* 103  CO2 20*  --  25 28 26 24   GLUCOSE 91  --  99 114* 119* 84  BUN 9  --  <5* <5* <5* <5*  CREATININE 1.00  --  0.78 0.76 0.64 0.66  CALCIUM 9.8   --  8.8* 8.9 8.8* 8.7*  MG  --  1.6*  --   --   --   --   PHOS  --  2.0*  --   --   --   --    Liver Function Tests:  Recent Labs Lab 05/02/16 2142 05/04/16 0651 05/05/16 0724  AST 28 40 46*  ALT 24 22 30   ALKPHOS 156* 106 105  BILITOT 1.5* 1.4* 0.8  PROT 7.9 6.0* 5.6*  ALBUMIN 4.4 3.1* 2.9*    Recent Labs Lab 05/02/16 2142  LIPASE 25   No results for input(s): AMMONIA in the last 168 hours. CBC:  Recent Labs Lab 05/02/16 2142 05/04/16 0651 05/05/16 0724 05/07/16 0514 05/08/16 0525  WBC 14.3* 8.3 6.3 4.8 5.7  NEUTROABS 11.7*  --   --   --   --   HGB 14.8 11.0* 11.6* 11.5* 11.0*  HCT 44.2 34.9* 35.7* 36.0* 34.9*  MCV 84.2 84.5 83.4 84.5 84.3  PLT 270 167 152 147* 155   Cardiac Enzymes: No results for input(s): CKTOTAL, CKMB, CKMBINDEX, TROPONINI in the last 168 hours. BNP: BNP (last 3 results) No results for input(s): BNP in the last 8760 hours.  ProBNP (last 3 results) No results for input(s): PROBNP in the last 8760 hours.  CBG: No results for input(s): GLUCAP in the last 168 hours.     SignedDomenic Polite MD.  [facility] 05/09/2016, 3:15 PM

## 2016-05-09 NOTE — Progress Notes (Signed)
Nsg Discharge Note  Admit Date:  05/02/2016 Discharge date: 05/09/2016   JAMMAR OMANS to be D/C'd Home per MD order.  AVS completed.  Copy for chart, and copy for patient signed, and dated. Patient/caregiver able to verbalize understanding.  Discharge Medication:   Medication List    STOP taking these medications   bismuth subsalicylate [id number] suspension Commonly known as:  PEPTO BISMOL     TAKE these medications   acetaminophen 500 MG tablet Commonly known as:  TYLENOL Take 1 tablet (500 mg total) by mouth every 6 (six) hours as needed for moderate pain (stomach pain).   amLODipine 5 MG tablet Commonly known as:  NORVASC Take 1 tablet (5 mg total) by mouth daily.   amoxicillin-clavulanate 875-125 MG tablet Commonly known as:  AUGMENTIN Take 1 tablet by mouth every 12 (twelve) hours. For 7days   oxyCODONE-acetaminophen 5-325 MG tablet Commonly known as:  PERCOCET/ROXICET Take 1-2 tablets by mouth every 8 (eight) hours as needed for moderate pain or severe pain.   Pancrelipase (Lip-Prot-Amyl) 24000 units Cpep Take 1 capsule (24,000 Units total) by mouth at bedtime.   pantoprazole 40 MG tablet Commonly known as:  PROTONIX Take 1 tablet (40 mg total) by mouth daily.   promethazine 25 MG tablet Commonly known as:  PHENERGAN Take 1 tablet (25 mg total) by mouth every 8 (eight) hours as needed for nausea or vomiting.       Discharge Assessment: Vitals:   05/09/16 0008 05/09/16 0516  BP: (!) 149/88 (!) 152/92  Pulse: (!) 57 (!) 53  Resp: 18 18  Temp: 97.8 F (36.6 C) 98 F (36.7 C)   Skin clean, dry and intact without evidence of skin break down, no evidence of skin tears noted. IV catheter discontinued intact. Site without signs and symptoms of complications - no redness or edema noted at insertion site, patient denies c/o pain - only slight tenderness at site.  Dressing with slight pressure applied.  D/c Instructions-Education: Discharge instructions  given to patient/family with verbalized understanding. D/c education completed with patient/family including follow up instructions, medication list, d/c activities limitations if indicated, with other d/c instructions as indicated by MD - patient able to verbalize understanding, all questions fully answered. Patient instructed to return to ED, call 911, or call MD for any changes in condition.  Patient escorted via himself, and D/C home via private auto.  Dayle Points, RN 05/09/2016 11:27 AM

## 2016-05-10 LAB — CULTURE, BLOOD (ROUTINE X 2): Culture: NO GROWTH

## 2016-05-12 MED FILL — ?PANTOPRAZOLE SOD DR 40MG: 40 MG | 30 days supply | Qty: 30 | Fill #1

## 2016-05-12 MED FILL — PROMETHAZINE 25 MG TABLET: 25 | 10 days supply | Qty: 30 | Fill #3

## 2016-05-14 MED FILL — ?AMOX-CLAV 875-125 MG TABLE: 875-125 | 7 days supply | Qty: 14 | Fill #0

## 2016-05-19 ENCOUNTER — Encounter: Payer: Self-pay | Admitting: Family Medicine

## 2016-05-19 ENCOUNTER — Ambulatory Visit: Payer: Self-pay | Attending: Family Medicine | Admitting: Family Medicine

## 2016-05-19 VITALS — BP 157/94 | HR 77 | Temp 98.7°F | Ht 75.0 in | Wt 152.8 lb

## 2016-05-19 DIAGNOSIS — L84 Corns and callosities: Secondary | ICD-10-CM

## 2016-05-19 DIAGNOSIS — Z0000 Encounter for general adult medical examination without abnormal findings: Secondary | ICD-10-CM

## 2016-05-19 DIAGNOSIS — B351 Tinea unguium: Secondary | ICD-10-CM

## 2016-05-19 DIAGNOSIS — F17210 Nicotine dependence, cigarettes, uncomplicated: Secondary | ICD-10-CM | POA: Insufficient documentation

## 2016-05-19 DIAGNOSIS — K852 Alcohol induced acute pancreatitis without necrosis or infection: Secondary | ICD-10-CM | POA: Insufficient documentation

## 2016-05-19 DIAGNOSIS — I10 Essential (primary) hypertension: Secondary | ICD-10-CM | POA: Insufficient documentation

## 2016-05-19 DIAGNOSIS — K219 Gastro-esophageal reflux disease without esophagitis: Secondary | ICD-10-CM

## 2016-05-19 MED ORDER — SUCRALFATE 1 G PO TABS: 1.0000 g | ORAL_TABLET | Freq: Three times a day (TID) | ORAL | 2 refills | Status: DC

## 2016-05-19 MED FILL — SUCRALFATE 1 GM TABLET: 1 | 30 days supply | Qty: 120 | Fill #0

## 2016-05-19 NOTE — Patient Instructions (Addendum)
Fazal was seen today for hospitalization follow-up.  Diagnoses and all orders for this visit:  Foot callus -     Ambulatory referral to Podiatry  Onychomycosis of toenail -     Ambulatory referral to [place] maintenance -     Ambulatory referral to Gastroenterology -     Flu Vaccine QUAD 36+ mos IM  Gastroesophageal reflux disease, esophagitis presence not specified -     sucralfate (CARAFATE) 1 g tablet; Take 1 tablet (1 g total) by mouth 4 (four) times daily -  with meals and at bedtime.  norvasc should continue to control BP for 24 hrs this is therefore dosed once daily.  If BP elevated at next OV, we will need either increase norvasc or change to better tolerated medication.   F/u in 6 weeks for HTN    Dr. Adrian Blackwater

## 2016-05-19 NOTE — Assessment & Plan Note (Signed)
A: HTN uncontrolled P:  norvasc should continue to control BP for 24 hrs this is therefore dosed once daily.  If BP elevated at next OV, we will need either increase norvasc or change to better tolerated medication.

## 2016-05-19 NOTE — Progress Notes (Signed)
Subjective:  Patient ID: Brent Rogers, male    DOB: [birth year]  Age: 50 y.o. MRN: [id number]  CC: Hospitalization Follow-up (pancrertitis)   HPI FORNEY WESTMORELAND presents for   1. HFU pancreatitis: he was hospitalized from 8/4-8/11/17 following an episode of alcohol induced pancreatitis and gram negative bacteremia with Klebsiella positive blood culture. He completed a 5 day course of Rocephin followed by two 9 more day of Augmentin. Repeat blood cultures prior to hospital discharge were negative.  Today, he reports no fever or chills. He denies abdominal pain except after eating. He still has diarrhea after meals.   2. HTN: he takes norvasc at night. He does have some dizziness after he takes the medication. No CP, or SOB or leg swelling.   3. HM: amenable to flu shot and screening c-scope.   Social History  Substance Use Topics  . Smoking status: Current Every Day Smoker    Packs/day: 0.25    Years: 30.00    Types: Cigarettes  . Smokeless tobacco: Former Systems developer    Types: Snuff     Comment: "used snuff 10-15 years in my 20s-30s"  . Alcohol use 2.4 oz/week    4 Cans of beer per week     Comment: 05/01/16    Outpatient Medications Prior to Visit  Medication Sig Dispense Refill  . acetaminophen (TYLENOL) 500 MG tablet Take 1 tablet (500 mg total) by mouth every 6 (six) hours as needed for moderate pain (stomach pain). 30 tablet 0  . amLODipine (NORVASC) 5 MG tablet Take 1 tablet (5 mg total) by mouth daily. 30 tablet 11  . amoxicillin-clavulanate (AUGMENTIN) 875-125 MG tablet Take 1 tablet by mouth every 12 (twelve) hours. For 7days 14 tablet 0  . oxyCODONE-acetaminophen (PERCOCET/ROXICET) 5-325 MG tablet Take 1-2 tablets by mouth every 8 (eight) hours as needed for moderate pain or severe pain. 12 tablet 0  . Pancrelipase, Lip-Prot-Amyl, 24000 units CPEP Take 1 capsule (24,000 Units total) by mouth at bedtime. 90 capsule 3  . pantoprazole (PROTONIX) 40 MG tablet Take 1 tablet  (40 mg total) by mouth daily. 30 tablet 5  . promethazine (PHENERGAN) 25 MG tablet Take 1 tablet (25 mg total) by mouth every 8 (eight) hours as needed for nausea or vomiting. 15 tablet 0   No facility-administered medications prior to visit.     ROS Review of Systems  Constitutional: Negative for chills, fatigue, fever and unexpected weight change.  Eyes: Negative for visual disturbance.  Respiratory: Negative for cough and shortness of breath.   Cardiovascular: Negative for chest pain, palpitations and leg swelling.  Gastrointestinal: Positive for abdominal pain and diarrhea. Negative for blood in stool, constipation, nausea and vomiting.  Endocrine: Negative for polydipsia, polyphagia and polyuria.  Musculoskeletal: Negative for arthralgias, back pain, gait problem, myalgias and neck pain.  Skin: Negative for rash.  Allergic/Immunologic: Negative for immunocompromised state.  Hematological: Negative for adenopathy. Does not bruise/bleed easily.  Psychiatric/Behavioral: Negative for dysphoric mood, sleep disturbance and suicidal ideas. The patient is not nervous/anxious.     Objective:  BP (!) 157/94 (BP Location: Right Arm, Patient Position: Sitting, Cuff Size: Small)   Pulse 77   Temp 98.7 F (37.1 C) (Oral)   Ht 6\' 3"  (1.905 m)   Wt 152 lb 12.8 oz (69.3 kg)   SpO2 98%   BMI 19.10 kg/m   BP/Weight 05/19/2016 A999333 [id number]  Systolic BP A999333 0000000 -  Diastolic BP 94 92 -  Wt. (Lbs) 152.8 -  149.4  BMI 19.1 - 18.67   BP Readings from Last 3 Encounters:  05/19/16 (!) 157/94  05/09/16 (!) 152/92  03/16/16 122/94    Physical Exam  Constitutional: He appears well-developed and well-nourished. No distress.  HENT:  Head: Normocephalic and atraumatic.  Neck: Normal range of motion. Neck supple.  Cardiovascular: Normal rate, regular rhythm, normal heart sounds and intact distal pulses.   Pulmonary/Chest: Effort normal and breath sounds normal.  Abdominal: Soft. Bowel  sounds are normal. He exhibits no distension and no mass. There is no tenderness. There is no rebound and no guarding.  Musculoskeletal: He exhibits no edema.       Feet:  Neurological: He is alert.  Skin: Skin is warm and dry. No rash noted. No erythema.  Psychiatric: He has a normal mood and affect.    Assessment & Plan:  Dee was seen today for hospitalization follow-up.  Diagnoses and all orders for this visit:  Foot callus -     Ambulatory referral to Podiatry  Onychomycosis of toenail -     Ambulatory referral to [place] maintenance -     Ambulatory referral to Gastroenterology -     Flu Vaccine QUAD 36+ mos IM  Gastroesophageal reflux disease, esophagitis presence not specified -     sucralfate (CARAFATE) 1 g tablet; Take 1 tablet (1 g total) by mouth 4 (four) times daily -  with meals and at bedtime.   There are no diagnoses linked to this encounter.  No orders of the defined types were placed in this encounter.   Follow-up: No Follow-up on file.   Boykin Nearing MD

## 2016-05-28 MED FILL — ?AMLODIPINE BESYLATE 5 MG T: 5 | 30 days supply | Qty: 30 | Fill #4

## 2016-05-30 ENCOUNTER — Telehealth: Payer: Self-pay | Admitting: Family Medicine

## 2016-05-30 DIAGNOSIS — K8681 Exocrine pancreatic insufficiency: Secondary | ICD-10-CM

## 2016-05-30 NOTE — Telephone Encounter (Signed)
Pt. Called stating that he was seen by his PCP and he was never given a refill on Pancrelipase, Lip-Prot-Amyl, 24000 units CPEP. Please f/u with pt.

## 2016-06-03 ENCOUNTER — Telehealth: Payer: Self-pay | Admitting: Family Medicine

## 2016-06-03 DIAGNOSIS — K8681 Exocrine pancreatic insufficiency: Secondary | ICD-10-CM

## 2016-06-03 MED ORDER — PANCRELIPASE (LIP-PROT-AMYL) 24000-76000 UNITS PO CPEP: 24000.0000 [IU] | ORAL_CAPSULE | Freq: Every day | ORAL | 3 refills | Status: DC

## 2016-06-03 NOTE — Telephone Encounter (Signed)
Med refilled. Please inform patient

## 2016-06-04 NOTE — Telephone Encounter (Signed)
Call to patient at [phone] left message w/ male advising patient to return call.  Please advise patient refill is done as requested.  Priscille Heidelberg, RN, BSN

## 2016-06-05 NOTE — Telephone Encounter (Signed)
Left message requesting return call.  When patient calls please inform him that his prescription has been called in as requested.

## 2016-06-23 ENCOUNTER — Encounter: Payer: Self-pay | Admitting: Family Medicine

## 2016-06-23 NOTE — Telephone Encounter (Signed)
Pt. Called stating that his Rx for Pancrelipase, Lip-Prot-Amyl, 24000 units CPEP  Was faxed over to his pharmacy but his PCP for got to put something in the Rx.  Please f/u with pt.

## 2016-06-24 MED ORDER — PANCRELIPASE (LIP-PROT-AMYL) 24000-76000 UNITS PO CPEP: 24000.0000 [IU] | ORAL_CAPSULE | Freq: Every day | ORAL | 3 refills | Status: DC

## 2016-06-24 NOTE — Telephone Encounter (Signed)
Refill sent to pharmacy. Please inform patient.

## 2016-06-27 NOTE — Telephone Encounter (Signed)
Phone rung x 3 then call ended, no option for voicemail.

## 2016-06-30 ENCOUNTER — Ambulatory Visit: Payer: Self-pay | Attending: Family Medicine | Admitting: Family Medicine

## 2016-06-30 ENCOUNTER — Encounter: Payer: Self-pay | Admitting: Family Medicine

## 2016-06-30 DIAGNOSIS — Z79899 Other long term (current) drug therapy: Secondary | ICD-10-CM | POA: Insufficient documentation

## 2016-06-30 DIAGNOSIS — IMO0002 Reserved for concepts with insufficient information to code with codable children: Secondary | ICD-10-CM

## 2016-06-30 DIAGNOSIS — K861 Other chronic pancreatitis: Secondary | ICD-10-CM

## 2016-06-30 DIAGNOSIS — K8681 Exocrine pancreatic insufficiency: Secondary | ICD-10-CM

## 2016-06-30 DIAGNOSIS — K219 Gastro-esophageal reflux disease without esophagitis: Secondary | ICD-10-CM

## 2016-06-30 DIAGNOSIS — K859 Acute pancreatitis without necrosis or infection, unspecified: Secondary | ICD-10-CM

## 2016-06-30 DIAGNOSIS — I10 Essential (primary) hypertension: Secondary | ICD-10-CM

## 2016-06-30 DIAGNOSIS — F17210 Nicotine dependence, cigarettes, uncomplicated: Secondary | ICD-10-CM | POA: Insufficient documentation

## 2016-06-30 MED ORDER — TRAMADOL HCL 50 MG PO TABS: 50.0000 mg | ORAL_TABLET | Freq: Three times a day (TID) | ORAL | 2 refills | Status: DC | PRN

## 2016-06-30 MED ORDER — PROMETHAZINE HCL 25 MG PO TABS: 25.0000 mg | ORAL_TABLET | Freq: Three times a day (TID) | ORAL | 2 refills | Status: DC | PRN

## 2016-06-30 MED ORDER — PANCRELIPASE (LIP-PROT-AMYL) 24000-76000 UNITS PO CPEP: 24000.0000 [IU] | ORAL_CAPSULE | Freq: Every day | ORAL | 3 refills | Status: DC

## 2016-06-30 MED FILL — traMADol HCL 50 MG TABS: 50 | 10 days supply | Qty: 30 | Fill #0

## 2016-06-30 MED FILL — AMLODIPINE BESYLATE 5 MG TA: 5 | 30 days supply | Qty: 30 | Fill #5

## 2016-06-30 MED FILL — PROMETHAZINE 25 MG TABLET: 25 | 10 days supply | Qty: 30 | Fill #4

## 2016-06-30 NOTE — Assessment & Plan Note (Signed)
Chronic Re ordered creon Tramadol for pain at home Agree with no food for 1-2 days of pain occurs to prevent serious flare Advised that he seek care in ED if he cannot keep down water

## 2016-06-30 NOTE — Progress Notes (Signed)
Subjective:  Patient ID: Brent Rogers, male    DOB: [birth year]  Age: 50 y.o. MRN: [id number]  CC: Hypertension   HPI KWINTON SCHOEPP presents for   1. CHRONIC HYPERTENSION  Disease Monitoring  Blood pressure range: not checking   Chest pain: no   Dyspnea: no   Claudication: no   Medication compliance: yes  Medication Side Effects  Lightheadedness: no   Urinary frequency: no   Edema: no   Impotence: no    2. Recurrent pancreatitis: out of creon x 5 months. Has intermittent abdominal pain, nausea and bilious emesis. He has been able to stop eating and take carafate, phenergan and percocet at home which relieved his pain. He is pain free today. No ETOH lately.    Social History  Substance Use Topics  . Smoking status: Current Every Day Smoker    Packs/day: 0.25    Years: 30.00    Types: Cigarettes  . Smokeless tobacco: Former Systems developer    Types: Snuff     Comment: "used snuff 10-15 years in my 20s-30s"  . Alcohol use 2.4 oz/week    4 Cans of beer per week     Comment: 05/01/16    Outpatient Medications Prior to Visit  Medication Sig Dispense Refill  . acetaminophen (TYLENOL) 500 MG tablet Take 1 tablet (500 mg total) by mouth every 6 (six) hours as needed for moderate pain (stomach pain). 30 tablet 0  . amLODipine (NORVASC) 5 MG tablet Take 1 tablet (5 mg total) by mouth daily. 30 tablet 11  . oxyCODONE-acetaminophen (PERCOCET/ROXICET) 5-325 MG tablet Take 1-2 tablets by mouth every 8 (eight) hours as needed for moderate pain or severe pain. 12 tablet 0  . pantoprazole (PROTONIX) 40 MG tablet Take 1 tablet (40 mg total) by mouth daily. 30 tablet 5  . promethazine (PHENERGAN) 25 MG tablet Take 1 tablet (25 mg total) by mouth every 8 (eight) hours as needed for nausea or vomiting. 15 tablet 0  . amoxicillin-clavulanate (AUGMENTIN) 875-125 MG tablet Take 1 tablet by mouth every 12 (twelve) hours. For 7days (Patient not taking: Reported on 06/30/2016) 14 tablet 0  .  Pancrelipase, Lip-Prot-Amyl, 24000-76000 units CPEP Take 1 capsule (24,000 Units total) by mouth at bedtime. (Patient not taking: Reported on 06/30/2016) 90 capsule 3  . sucralfate (CARAFATE) 1 g tablet Take 1 tablet (1 g total) by mouth 4 (four) times daily -  with meals and at bedtime. (Patient not taking: Reported on 06/30/2016) 120 tablet 2   No facility-administered medications prior to visit.     ROS Review of Systems  Constitutional: Negative for chills, fatigue, fever and unexpected weight change.  Eyes: Negative for visual disturbance.  Respiratory: Negative for cough and shortness of breath.   Cardiovascular: Negative for chest pain, palpitations and leg swelling.  Gastrointestinal: Negative for abdominal pain, blood in stool, constipation, diarrhea, nausea and vomiting.  Endocrine: Negative for polydipsia, polyphagia and polyuria.  Musculoskeletal: Negative for arthralgias, back pain, gait problem, myalgias and neck pain.  Skin: Negative for rash.  Allergic/Immunologic: Negative for immunocompromised state.  Hematological: Negative for adenopathy. Does not bruise/bleed easily.  Psychiatric/Behavioral: Negative for dysphoric mood, sleep disturbance and suicidal ideas. The patient is not nervous/anxious.     Objective:  BP 129/90 (BP Location: Right Arm, Patient Position: Sitting, Cuff Size: Small)   Pulse 76   Temp 98.7 F (37.1 C) (Oral)   Ht 6\' 3"  (1.905 m)   Wt 153 lb 6.4 oz (69.6 kg)  SpO2 100%   BMI 19.17 kg/m   BP/Weight 06/30/2016 05/19/2016 A999333  Systolic BP [id number] A999333 0000000  Diastolic BP 90 94 92  Wt. (Lbs) 153.4 152.8 -  BMI 19.17 19.1 -   Wt Readings from Last 3 Encounters:  06/30/16 153 lb 6.4 oz (69.6 kg)  05/19/16 152 lb 12.8 oz (69.3 kg)  05/03/16 149 lb 6.4 oz (67.8 kg)    Physical Exam  Constitutional: He appears well-developed and well-nourished. No distress.  HENT:  Head: Normocephalic and atraumatic.  Neck: Normal range of motion. Neck  supple.  Cardiovascular: Normal rate, regular rhythm, normal heart sounds and intact distal pulses.   Pulmonary/Chest: Effort normal and breath sounds normal.  Abdominal: Soft. Bowel sounds are normal. He exhibits no distension and no mass. There is no tenderness. There is no rebound and no guarding.  Musculoskeletal: He exhibits no edema.  Neurological: He is alert.  Skin: Skin is warm and dry. No rash noted. No erythema.  Psychiatric: He has a normal mood and affect.     Assessment & Plan:  Gannen was seen today for hypertension.  Diagnoses and all orders for this visit:  Gastroesophageal reflux disease, esophagitis presence not specified  Exocrine pancreatic insufficiency -     Discontinue: Pancrelipase, Lip-Prot-Amyl, 24000-76000 units CPEP; Take 1 capsule (24,000 Units total) by mouth at bedtime. -     Pancrelipase, Lip-Prot-Amyl, 24000-76000 units CPEP; Take 1 capsule (24,000 Units total) by mouth at bedtime.  Recurrent pancreatitis ([place]) -     Discontinue: promethazine (PHENERGAN) 25 MG tablet; Take 1 tablet (25 mg total) by mouth every 8 (eight) hours as needed for nausea or vomiting. -     traMADol (ULTRAM) 50 MG tablet; Take 1 tablet (50 mg total) by mouth every 8 (eight) hours as needed. -     promethazine (PHENERGAN) 25 MG tablet; Take 1 tablet (25 mg total) by mouth every 8 (eight) hours as needed for nausea or vomiting.   There are no diagnoses linked to this encounter.  No orders of the defined types were placed in this encounter.   Follow-up: Return in about 3 months (around 09/30/2016) for HTN .   Boykin Nearing MD

## 2016-06-30 NOTE — Patient Instructions (Addendum)
Brent Rogers was seen today for hypertension.  Diagnoses and all orders for this visit:  Gastroesophageal reflux disease, esophagitis presence not specified  Exocrine pancreatic insufficiency -     Discontinue: Pancrelipase, Lip-Prot-Amyl, 24000-76000 units CPEP; Take 1 capsule (24,000 Units total) by mouth at bedtime. -     Pancrelipase, Lip-Prot-Amyl, 24000-76000 units CPEP; Take 1 capsule (24,000 Units total) by mouth at bedtime.  Recurrent pancreatitis ([place]) -     Discontinue: promethazine (PHENERGAN) 25 MG tablet; Take 1 tablet (25 mg total) by mouth every 8 (eight) hours as needed for nausea or vomiting. -     traMADol (ULTRAM) 50 MG tablet; Take 1 tablet (50 mg total) by mouth every 8 (eight) hours as needed. -     promethazine (PHENERGAN) 25 MG tablet; Take 1 tablet (25 mg total) by mouth every 8 (eight) hours as needed for nausea or vomiting.    F/u in 3 months for HTN   Dr. Adrian Blackwater

## 2016-06-30 NOTE — Progress Notes (Signed)
Pt states that he has not had Pancrelipse in 5 months.  Flu complete.

## 2016-06-30 NOTE — Assessment & Plan Note (Signed)
controlled Compliant Continue current Norvasc 5 mg daily

## 2016-07-01 NOTE — Telephone Encounter (Signed)
Patient hipaa verif per guidelines. Rn advised patient per Dr. Adrian Blackwater refill request has been completed

## 2016-07-03 MED FILL — PANTOPRAZOLE SOD DR 40 MG T: 40 | 30 days supply | Qty: 30 | Fill #2

## 2016-07-08 ENCOUNTER — Telehealth: Payer: Self-pay | Admitting: Family Medicine

## 2016-07-08 ENCOUNTER — Other Ambulatory Visit: Payer: Self-pay | Admitting: Family Medicine

## 2016-07-08 DIAGNOSIS — K8681 Exocrine pancreatic insufficiency: Secondary | ICD-10-CM

## 2016-07-08 NOTE — Telephone Encounter (Signed)
Please contact patient once faxed [phone]

## 2016-07-08 NOTE — Telephone Encounter (Signed)
Patient need pancrelipase sent to program   Fa:1 [phone] Include patient id : [id number] Creon is medication name

## 2016-07-09 NOTE — Addendum Note (Signed)
Addended by: Boykin Nearing on: 07/09/2016 06:42 PM   Modules accepted: Orders

## 2016-07-09 NOTE — Telephone Encounter (Signed)
Please print and fax Rx Rx pended

## 2016-07-09 NOTE — Telephone Encounter (Signed)
Will route to PCP 

## 2016-07-11 ENCOUNTER — Other Ambulatory Visit: Payer: Self-pay

## 2016-07-11 DIAGNOSIS — K8681 Exocrine pancreatic insufficiency: Secondary | ICD-10-CM

## 2016-07-11 MED ORDER — PANCRELIPASE (LIP-PROT-AMYL) 24000-76000 UNITS PO CPEP: 24000.0000 [IU] | ORAL_CAPSULE | Freq: Every day | ORAL | 3 refills | Status: DC

## 2016-07-11 NOTE — Telephone Encounter (Signed)
Pt script was printed out and faxed over on 10/13.

## 2016-07-24 ENCOUNTER — Other Ambulatory Visit: Payer: Self-pay

## 2016-07-24 DIAGNOSIS — K8681 Exocrine pancreatic insufficiency: Secondary | ICD-10-CM

## 2016-07-24 MED ORDER — PANCRELIPASE (LIP-PROT-AMYL) 24000-76000 UNITS PO CPEP: 24000.0000 [IU] | ORAL_CAPSULE | Freq: Every day | ORAL | 3 refills | Status: DC

## 2016-08-12 MED FILL — traMADol HCL 50 MG TABS: 50 | 10 days supply | Qty: 30 | Fill #1

## 2016-08-12 MED FILL — PROMETHAZINE 25 MG TABLET: 25 | 10 days supply | Qty: 30 | Fill #0

## 2016-08-12 MED FILL — ?AMLODIPINE BESYLATE 5 MG T: 5 | 30 days supply | Qty: 30 | Fill #6

## 2016-08-27 MED FILL — ?PANTOPRAZOLE SOD DR 40MG: 40 MG | 30 days supply | Qty: 30 | Fill #3

## 2016-08-27 MED FILL — traMADol HCL 50 MG TABS: 50 | 10 days supply | Qty: 30 | Fill #2

## 2016-09-05 ENCOUNTER — Emergency Department (HOSPITAL_COMMUNITY): Payer: Self-pay

## 2016-09-05 ENCOUNTER — Emergency Department (HOSPITAL_COMMUNITY)
Admission: EM | Admit: 2016-09-05 | Discharge: 2016-09-06 | Disposition: A | Discharge status: Home / Self Care | Payer: Self-pay | Attending: Emergency Medicine | Admitting: Emergency Medicine

## 2016-09-05 ENCOUNTER — Encounter (HOSPITAL_COMMUNITY): Payer: Self-pay | Admitting: Emergency Medicine

## 2016-09-05 DIAGNOSIS — Z79899 Other long term (current) drug therapy: Secondary | ICD-10-CM | POA: Insufficient documentation

## 2016-09-05 DIAGNOSIS — F17210 Nicotine dependence, cigarettes, uncomplicated: Secondary | ICD-10-CM | POA: Insufficient documentation

## 2016-09-05 DIAGNOSIS — I10 Essential (primary) hypertension: Secondary | ICD-10-CM | POA: Insufficient documentation

## 2016-09-05 DIAGNOSIS — K861 Other chronic pancreatitis: Secondary | ICD-10-CM | POA: Insufficient documentation

## 2016-09-05 DIAGNOSIS — R079 Chest pain, unspecified: Secondary | ICD-10-CM | POA: Insufficient documentation

## 2016-09-05 MED ORDER — SODIUM CHLORIDE 0.9 % IV BOLUS (SEPSIS)
1000.0000 mL | Freq: Once | INTRAVENOUS | Status: AC
  Administered 2016-09-05: 1000 mL via INTRAVENOUS

## 2016-09-05 MED ORDER — HYDROMORPHONE HCL 2 MG/ML IJ SOLN
1.0000 mg | Freq: Once | INTRAMUSCULAR | Status: AC
Start: 2016-09-05 — End: 2016-09-05
  Administered 2016-09-05: 1 mg via INTRAVENOUS
  Filled 2016-09-05: qty 1

## 2016-09-05 MED ORDER — ONDANSETRON HCL 4 MG/2ML IJ SOLN
4.0000 mg | Freq: Once | INTRAMUSCULAR | Status: AC
  Administered 2016-09-05: 4 mg via INTRAVENOUS
  Filled 2016-09-05: qty 2

## 2016-09-05 NOTE — ED Triage Notes (Signed)
Pt reports L sided abdominal pain and back pain ongoing x2 weeks, pt states he has hx of pancreatitis. States pain is similar to pancreatitis. States PO meds at home not effective. Reports vomiting, no diarrhea. Drinks 1-2 beers "so I can sleep." Sudden onset Cp while in ambulance. States pain radiates up from abdomen, described at tight.

## 2016-09-05 NOTE — ED Notes (Signed)
Pt to xray

## 2016-09-05 NOTE — ED Provider Notes (Signed)
[facility] DEPT Provider Note   CSN: [id number] Arrival date & time: 09/05/16  2243  By signing my name below, I, Doran Stabler, attest that this documentation has been prepared under the direction and in the presence of Orpah Greek, MD. Electronically Signed: Doran Stabler, ED Scribe. 09/06/16. 11:11 PM.  History   Chief Complaint Chief Complaint  Patient presents with  . Abdominal Pain  . Chest Pain   The history is provided by the patient. No language interpreter was used.   HPI Comments: Brent Rogers is a 50 y.o. male who presents to the Emergency Department complaining of left sided abdominal pain that has been ongoing for the past 2 weeks but suddenly worsened today. Pt also reports yellow vomitus and chest pain. Pt states that he has this type of episode with these symptoms every 2-3 months consistent with his "pancreatitis flare up." He received a nitroglycerine en route which provided mild relief. Pt denies any fevers, chills, SOB, diarrhea, or any other symptoms at this time.    Past Medical History:  Diagnosis Date  . Chronic pancreatitis ([place])    S/P [place]  . Hypertension   . Migraine    "@ least once/month" (11/12/2015)  . Pancreatic abnormality    CT  shows mass  . Pneumonia 11/12/2015  . Scoliosis     Patient Active Problem List   Diagnosis Date Noted  . Foot ulcer with fat layer exposed ([place])   . Foot ulcer, right ([place])   . Alcohol abuse 05/03/2016  . Foot callus 12/25/2015  . Onychomycosis of toenail 12/25/2015  . Fatty liver 11/12/2015  . Elevated alkaline phosphatase level 11/12/2015  . GERD (gastroesophageal reflux disease) 11/12/2015  . HTN (hypertension) 07/10/2015  . S/P cholecystectomy 07/10/2015  . Tobacco abuse 12/26/2014  . Protein-calorie malnutrition, severe ([place]) 11/20/2014  . Exocrine pancreatic insufficiency ([place]) 12/23/2013    Past Surgical History:  Procedure Laterality Date  . BACK SURGERY    . EUS N/A  09/21/2013   Procedure: ESOPHAGEAL ENDOSCOPIC ULTRASOUND (EUS) RADIAL;  Surgeon: Beryle Beams, MD;  Location: [facility] ENDOSCOPY;  Service: Endoscopy;  Laterality: N/A;  . EUS N/A 09/30/2013   Procedure: UPPER ENDOSCOPIC ULTRASOUND (EUS) LINEAR;  Surgeon: Beryle Beams, MD;  Location: [facility] ENDOSCOPY;  Service: Endoscopy;  Laterality: N/A;  . FINE NEEDLE ASPIRATION N/A 09/21/2013   Procedure: FINE NEEDLE ASPIRATION (FNA) LINEAR;  Surgeon: Beryle Beams, MD;  Location: [facility] ENDOSCOPY;  Service: Endoscopy;  Laterality: N/A;  . FINGER FRACTURE SURGERY Left 1995   5th digit  . FRACTURE SURGERY    . KNEE ARTHROSCOPY Bilateral 1987-1989   right-left  . LAPAROSCOPY N/A 12/01/2013   Procedure: LAPAROSCOPY DIAGNOSTIC PANCREATICODUODENECTOMY WITH BILIARY AND PANCREATIC STENTS;  Surgeon: Stark Klein, MD;  Location: [facility] ORS;  Service: General;  Laterality: N/A;  . LUMBAR [place] SURGERY  1990's  . WHIPPLE PROCEDURE  12/01/2013       Home Medications    Prior to Admission medications   Medication Sig Start Date End Date Taking? Authorizing Provider  amLODipine (NORVASC) 5 MG tablet Take 1 tablet (5 mg total) by mouth daily. 12/25/15  Yes Josalyn Funches, MD  Pancrelipase, Lip-Prot-Amyl, 24000-76000 units CPEP Take 1 capsule (24,000 Units total) by mouth at bedtime. 07/24/16  Yes Josalyn Funches, MD  pantoprazole (PROTONIX) 40 MG tablet Take 1 tablet (40 mg total) by mouth daily. 03/11/16  Yes Hosie Poisson, MD  promethazine (PHENERGAN) 25 MG tablet Take 1 tablet (25 mg total) by  mouth every 8 (eight) hours as needed for nausea or vomiting. 06/30/16  Yes Josalyn Funches, MD  sucralfate (CARAFATE) 1 g tablet Take 1 tablet (1 g total) by mouth 4 (four) times daily -  with meals and at bedtime. 05/19/16  Yes Josalyn Funches, MD  traMADol (ULTRAM) 50 MG tablet Take 1 tablet (50 mg total) by mouth every 8 (eight) hours as needed. Patient taking differently: Take 50 mg by mouth every 8 (eight) hours as needed (for pain).   06/30/16  Yes Josalyn Funches, MD  acetaminophen (TYLENOL) 500 MG tablet Take 1 tablet (500 mg total) by mouth every 6 (six) hours as needed for moderate pain (stomach pain). Patient not taking: Reported on 09/06/2016 03/11/16   Hosie Poisson, MD    Family History Family History  Problem Relation Age of Onset  . Cancer Mother     unsure  . Hypertension Father     Social History Social History  Substance Use Topics  . Smoking status: Current Every Day Smoker    Packs/day: 0.25    Years: 30.00    Types: Cigarettes  . Smokeless tobacco: Former Systems developer    Types: Snuff     Comment: "used snuff 10-15 years in my 20s-30s"  . Alcohol use 2.4 oz/week    4 Cans of beer per week     Comment: 05/01/16     Allergies   Patient has no known allergies.   Review of Systems Review of Systems  Constitutional: Negative for chills and fever.  Respiratory: Negative for shortness of breath.   Cardiovascular: Positive for chest pain.  Gastrointestinal: Positive for abdominal pain and vomiting. Negative for diarrhea.  All other systems reviewed and are negative.  Physical Exam Updated Vital Signs BP 114/86 (BP Location: Right Arm)   Pulse 76   Temp 98.7 F (37.1 C) (Oral)   Resp 19   Ht 6\' 3"  (1.905 m)   Wt 155 lb (70.3 kg)   SpO2 99%   BMI 19.37 kg/m   Physical Exam  Constitutional: He is oriented to person, place, and time. He appears well-developed and well-nourished. No distress.  HENT:  Head: Normocephalic and atraumatic.  Right Ear: Hearing normal.  Left Ear: Hearing normal.  Nose: Nose normal.  Mouth/Throat: Oropharynx is clear and moist and mucous membranes are normal.  Eyes: Conjunctivae and EOM are normal. Pupils are equal, round, and reactive to light.  Neck: Normal range of motion. Neck supple.  Cardiovascular: Regular rhythm, S1 normal and S2 normal.  Exam reveals no gallop and no friction rub.   No murmur heard. Pulmonary/Chest: Effort normal and breath sounds normal.  No respiratory distress. He exhibits no tenderness.  Abdominal: Soft. Normal appearance and bowel sounds are normal. There is no hepatosplenomegaly. There is tenderness (left sided). There is no rebound, no guarding, no tenderness at McBurney's point and negative Murphy's sign. No hernia.  Musculoskeletal: Normal range of motion.  Neurological: He is alert and oriented to person, place, and time. He has normal strength. No cranial nerve deficit or sensory deficit. Coordination normal. GCS eye subscore is 4. GCS verbal subscore is 5. GCS motor subscore is 6.  Skin: Skin is warm, dry and intact. No rash noted. No cyanosis.  Calluses to the planter aspect of left foot. No signs of infection.   Psychiatric: He has a normal mood and affect. His speech is normal and behavior is normal. Thought content normal.  Nursing note and vitals reviewed.  ED Treatments / Results  DIAGNOSTIC STUDIES: Oxygen Saturation is 100% on room air, normal by my interpretation.    COORDINATION OF CARE: 11:11 PM Discussed treatment plan with pt at bedside and pt agreed to plan.  Labs (all labs ordered are listed, but only abnormal results are displayed) Labs Reviewed  COMPREHENSIVE METABOLIC PANEL - Abnormal; Notable for the following:       Result Value   CO2 16 (*)    Glucose, Bld 112 (*)    BUN <5 (*)    Calcium 8.4 (*)    Total Protein 6.0 (*)    ALT 16 (*)    All other components within normal limits  LIPASE, BLOOD  CBC  URINALYSIS, ROUTINE W REFLEX MICROSCOPIC  I-STAT TROPOININ, ED    EKG  EKG Interpretation  Date/Time:  Friday September 05 2016 22:43:52 EST Ventricular Rate:  80 PR Interval:    QRS Duration: 82 QT Interval:  384 QTC Calculation: 443 R Axis:   70 Text Interpretation:  Sinus rhythm Biatrial enlargement ST elev, probable normal early repol pattern No significant change since last tracing Confirmed by POLLINA  MD, CHRISTOPHER [phone]) on 09/05/2016 11:10:53 PM       Radiology Dg  Chest 2 View  Result Date: 09/05/2016 CLINICAL DATA:  Initial evaluation for acute chest pain. EXAM: CHEST  2 VIEW COMPARISON:  Prior radiograph from 03/06/2016. FINDINGS: The cardiac and mediastinal silhouettes are stable in size and contour, and remain within normal limits. The lungs are normally inflated. No airspace consolidation, pleural effusion, or pulmonary edema is identified. There is no pneumothorax. No acute osseous abnormality identified. IMPRESSION: No active cardiopulmonary disease. Electronically Signed   By: Jeannine Boga M.D.   On: 09/05/2016 23:15    Procedures Procedures (including critical care time)  Medications Ordered in ED Medications  sodium chloride 0.9 % bolus 1,000 mL (0 mLs Intravenous Stopped 09/06/16 0326)  HYDROmorphone (DILAUDID) injection 1 mg (1 mg Intravenous Given 09/05/16 2328)  ondansetron (ZOFRAN) injection 4 mg (4 mg Intravenous Given 09/05/16 2328)  HYDROmorphone (DILAUDID) injection 1 mg (1 mg Intravenous Given 09/06/16 0323)     Initial Impression / Assessment and Plan / ED Course  I have reviewed the triage vital signs and the nursing notes.  Pertinent labs & imaging results that were available during my care of the patient were reviewed by me and considered in my medical decision making (see chart for details).  Clinical Course    Patient presented with complaints of abdominal and chest pain consistent with prior pancreatitis pain. He reports no relief with home meds. He stated that every couple of months this happens and he needs analgesia and fluids.   His Lipase is normal. Remainder of labs unremarkable. Cardiac eval negative. Will discharge, follow-up with primary doctor.  Final Clinical Impressions(s) / ED Diagnoses   Final diagnoses:  Chronic pancreatitis, unspecified pancreatitis type [facility])    New Prescriptions New Prescriptions   No medications on file   I personally performed the services described in this documentation,  which was scribed in my presence. The recorded information has been reviewed and is accurate. '    Orpah Greek, MD 09/06/16 [phone]

## 2016-09-05 NOTE — ED Notes (Signed)
Delay in lab draw,  Pt in exray

## 2016-09-06 ENCOUNTER — Encounter (HOSPITAL_COMMUNITY): Payer: Self-pay

## 2016-09-06 ENCOUNTER — Emergency Department (HOSPITAL_COMMUNITY)
Admission: EM | Admit: 2016-09-06 | Discharge: 2016-09-06 | Disposition: A | Discharge status: Home / Self Care | Payer: Self-pay | Attending: Emergency Medicine | Admitting: Emergency Medicine

## 2016-09-06 DIAGNOSIS — I10 Essential (primary) hypertension: Secondary | ICD-10-CM | POA: Insufficient documentation

## 2016-09-06 DIAGNOSIS — R251 Tremor, unspecified: Secondary | ICD-10-CM

## 2016-09-06 DIAGNOSIS — F17210 Nicotine dependence, cigarettes, uncomplicated: Secondary | ICD-10-CM | POA: Insufficient documentation

## 2016-09-06 LAB — CBC
HCT: 42.5 % (ref 39.0–52.0)
Hemoglobin: 14 g/dL (ref 13.0–17.0)
MCH: 28.5 pg (ref 26.0–34.0)
MCHC: 32.9 g/dL (ref 30.0–36.0)
MCV: 86.6 fL (ref 78.0–100.0)
Platelets: 182 10*3/uL (ref 150–400)
RBC: 4.91 MIL/uL (ref 4.22–5.81)
RDW: 14.9 % (ref 11.5–15.5)
WBC: 8.3 10*3/uL (ref 4.0–10.5)

## 2016-09-06 LAB — I-STAT TROPONIN, ED
Troponin i, poc: 0 ng/mL (ref 0.00–0.08)
Troponin i, poc: 0 ng/mL (ref 0.00–0.08)

## 2016-09-06 LAB — COMPREHENSIVE METABOLIC PANEL
ALT: 16 U/L — ABNORMAL LOW (ref 17–63)
AST: 24 U/L (ref 15–41)
Albumin: 3.5 g/dL (ref 3.5–5.0)
Alkaline Phosphatase: 109 U/L (ref 38–126)
Anion gap: 14 (ref 5–15)
BUN: <5 mg/dL — ABNORMAL LOW (ref 6–20)
CO2: 16 mmol/L — ABNORMAL LOW (ref 22–32)
Calcium: 8.4 mg/dL — ABNORMAL LOW (ref 8.9–10.3)
Chloride: 108 mmol/L (ref 101–111)
Creatinine, Ser: 0.82 mg/dL (ref 0.61–1.24)
GFR calc Af Amer: >60 mL/min (ref >60)
GFR calc non Af Amer: >60 mL/min (ref >60)
Glucose, Bld: 112 mg/dL — ABNORMAL HIGH (ref 65–99)
Potassium: 3.9 mmol/L (ref 3.5–5.1)
Sodium: 138 mmol/L (ref 135–145)
Total Bilirubin: UNDETERMINED mg/dL (ref 0.3–1.2)
Total Protein: 6 g/dL — ABNORMAL LOW (ref 6.5–8.1)

## 2016-09-06 LAB — URINALYSIS, ROUTINE W REFLEX MICROSCOPIC
Bilirubin Urine: NEGATIVE
Glucose, UA: NEGATIVE mg/dL
Hgb urine dipstick: NEGATIVE
Ketones, ur: NEGATIVE mg/dL
Leukocytes, UA: NEGATIVE
Nitrite: NEGATIVE
Protein, ur: NEGATIVE mg/dL
Specific Gravity, Urine: 1.006 (ref 1.005–1.030)
pH: 5 (ref 5.0–8.0)

## 2016-09-06 LAB — I-STAT CHEM 8, ED
BUN: <3 mg/dL — ABNORMAL LOW (ref 6–20)
Calcium, Ion: 1.12 mmol/L — ABNORMAL LOW (ref 1.15–1.40)
Chloride: 101 mmol/L (ref 101–111)
Creatinine, Ser: 0.7 mg/dL (ref 0.61–1.24)
Glucose, Bld: 101 mg/dL — ABNORMAL HIGH (ref 65–99)
HCT: 43 % (ref 39.0–52.0)
Hemoglobin: 14.6 g/dL (ref 13.0–17.0)
Potassium: 3.4 mmol/L — ABNORMAL LOW (ref 3.5–5.1)
Sodium: 138 mmol/L (ref 135–145)
TCO2: 25 mmol/L (ref 0–100)

## 2016-09-06 LAB — LIPASE, BLOOD: Lipase: 41 U/L (ref 11–51)

## 2016-09-06 MED ORDER — HYDROMORPHONE HCL 2 MG/ML IJ SOLN
1.0000 mg | Freq: Once | INTRAMUSCULAR | Status: AC
  Administered 2016-09-06: 1 mg via INTRAVENOUS
  Filled 2016-09-06: qty 1

## 2016-09-06 MED ORDER — ONDANSETRON HCL 4 MG/2ML IJ SOLN
4.0000 mg | Freq: Once | INTRAMUSCULAR | Status: AC
  Administered 2016-09-06: 4 mg via INTRAVENOUS
  Filled 2016-09-06: qty 2

## 2016-09-06 NOTE — ED Notes (Signed)
Patient left at this time with all belongings. 

## 2016-09-06 NOTE — ED Provider Notes (Signed)
[facility] DEPT Provider Note   CSN: [id number] Arrival date & time: 09/06/16  1221     History   Chief Complaint Chief Complaint  Patient presents with  . Chest Pain  . Abdominal Pain  . Shaking    HPI Brent Rogers is a 50 y.o. male.  The history is provided by the patient and medical records.     50 year old male with history of chronic pancreatitis status post Whipple, hypertension, scoliosis, chronic pancreatitis, presenting to the ED for "shaking".  Patient was seen here in the hospital overnight for abdominal pain as well as nausea and vomiting. He was discharged this morning around 4 AM with a diagnosis of chronic pancreatitis. States while he was here he was given Dilaudid for pain and thinks it caused some type of "reaction". States he was fine until he went home and took his normal medications and took a nap. States he woke up and could not stop shaking. He has not had any fever or chills. He denies any alcohol intake today. He denies any illicit drug use.  Patient has no hx of seizure disorder.  No recent head injury or falls.  No hx of TIA or stroke.  He is not on anti-coagulation. He reportedly told triage that he had continued chest and abdominal pain, he denies this to me. States he is here because he cannot stop "shaking".  Past Medical History:  Diagnosis Date  . Chronic pancreatitis ([place])    S/P Whipple  . Hypertension   . Migraine    "@ least once/month" (11/12/2015)  . Pancreatic abnormality    CT  shows mass  . Pneumonia 11/12/2015  . Scoliosis     Patient Active Problem List   Diagnosis Date Noted  . Foot ulcer with fat layer exposed ([place])   . Foot ulcer, right ([place])   . Alcohol abuse 05/03/2016  . Foot callus 12/25/2015  . Onychomycosis of toenail 12/25/2015  . Fatty liver 11/12/2015  . Elevated alkaline phosphatase level 11/12/2015  . GERD (gastroesophageal reflux disease) 11/12/2015  . HTN (hypertension) 07/10/2015  . S/P cholecystectomy  07/10/2015  . Tobacco abuse 12/26/2014  . Protein-calorie malnutrition, severe ([place]) 11/20/2014  . Exocrine pancreatic insufficiency ([place]) 12/23/2013    Past Surgical History:  Procedure Laterality Date  . BACK SURGERY    . EUS N/A 09/21/2013   Procedure: ESOPHAGEAL ENDOSCOPIC ULTRASOUND (EUS) RADIAL;  Surgeon: Beryle Beams, MD;  Location: [facility] ENDOSCOPY;  Service: Endoscopy;  Laterality: N/A;  . EUS N/A 09/30/2013   Procedure: UPPER ENDOSCOPIC ULTRASOUND (EUS) LINEAR;  Surgeon: Beryle Beams, MD;  Location: [facility] ENDOSCOPY;  Service: Endoscopy;  Laterality: N/A;  . FINE NEEDLE ASPIRATION N/A 09/21/2013   Procedure: FINE NEEDLE ASPIRATION (FNA) LINEAR;  Surgeon: Beryle Beams, MD;  Location: [facility] END[facility]COPY;  Service: Endoscopy;  Laterality: N/A;  . FINGER FRACTURE SURGERY Left 1995   5th digit  . FRACTURE SURGERY    . KNEE ARTHROSCOPY Bilateral 1987-1989   right-left  . LAPAROSCOPY N/A 12/01/2013   Procedure: LAPAROSCOPY DIAGNOSTIC PANCREATICODUODENECTOMY WITH BILIARY AND PANCREATIC STENTS;  Surgeon: Stark Klein, MD;  Location: [facility] ORS;  Service: General;  Laterality: N/A;  . LUMBAR Eleanor SURGERY  1990's  . WHIPPLE PROCEDURE  12/01/2013       Home Medications    Prior to Admission medications   Medication Sig Start Date End Date Taking? Authorizing Provider  acetaminophen (TYLENOL) 500 MG tablet Take 1 tablet (500 mg total) by mouth every 6 (  six) hours as needed for moderate pain (stomach pain). Patient not taking: Reported on 09/06/2016 03/11/16   Hosie Poisson, MD  amLODipine (NORVASC) 5 MG tablet Take 1 tablet (5 mg total) by mouth daily. 12/25/15   Josalyn Funches, MD  Pancrelipase, Lip-Prot-Amyl, 24000-76000 units CPEP Take 1 capsule (24,000 Units total) by mouth at bedtime. 07/24/16   Josalyn Funches, MD  pantoprazole (PROTONIX) 40 MG tablet Take 1 tablet (40 mg total) by mouth daily. 03/11/16   Hosie Poisson, MD  promethazine (PHENERGAN) 25 MG tablet Take 1 tablet (25 mg total) by mouth  every 8 (eight) hours as needed for nausea or vomiting. 06/30/16   Josalyn Funches, MD  sucralfate (CARAFATE) 1 g tablet Take 1 tablet (1 g total) by mouth 4 (four) times daily -  with meals and at bedtime. 05/19/16   Josalyn Funches, MD  traMADol (ULTRAM) 50 MG tablet Take 1 tablet (50 mg total) by mouth every 8 (eight) hours as needed. Patient taking differently: Take 50 mg by mouth every 8 (eight) hours as needed (for pain).  06/30/16   Boykin Nearing, MD    Family History Family History  Problem Relation Age of Onset  . Cancer Mother     unsure  . Hypertension Father     Social History Social History  Substance Use Topics  . Smoking status: Current Every Day Smoker    Packs/day: 0.25    Years: 30.00    Types: Cigarettes  . Smokeless tobacco: Former Systems developer    Types: Snuff     Comment: "used snuff 10-15 years in my 20s-30s"  . Alcohol use 2.4 oz/week    4 Cans of beer per week     Comment: 05/01/16     Allergies   Patient has no known allergies.   Review of Systems Review of Systems  Neurological:       "shaking"  All other systems reviewed and are negative.    Physical Exam Updated Vital Signs BP 124/99 (BP Location: Left Arm)   Pulse 90   Temp 98.5 F (36.9 C) (Oral)   Resp 19   SpO2 100%   Physical Exam  Constitutional: He is oriented to person, place, and time. He appears well-developed and well-nourished.  HENT:  Head: Normocephalic and atraumatic.  Mouth/Throat: Oropharynx is clear and moist.  No head or oral trauma noted  Eyes: Conjunctivae and EOM are normal. Pupils are equal, round, and reactive to light.  Neck: Normal range of motion.  Cardiovascular: Normal rate, regular rhythm and normal heart sounds.   Pulmonary/Chest: Effort normal and breath sounds normal. No respiratory distress. He has no wheezes. He has no rhonchi.  Abdominal: Soft. Bowel sounds are normal. There is no tenderness. There is no rebound.  Musculoskeletal: Normal range of  motion.  Neurological: He is alert and oriented to person, place, and time.  AAOx3, answering questions and following commands appropriately; equal strength UE and LE bilaterally; CN grossly intact; moves all extremities appropriately without ataxia; no focal neuro deficits or facial asymmetry appreciated, speech is clear Patient has controlled shaking of both hands, when asked to perform tasks such as holding objects or squeezing hands this stops; he also stopped to scratch his head during exam without any apparent shaking No true tremors or seizure activity  Skin: Skin is warm and dry.  Psychiatric: He has a normal mood and affect.  Nursing note and vitals reviewed.    ED Treatments / Results  Labs (all labs ordered are  listed, but only abnormal results are displayed) Labs Reviewed  I-STAT CHEM 8, ED - Abnormal; Notable for the following:       Result Value   Potassium 3.4 (*)    BUN <3 (*)    Glucose, Bld 101 (*)    Calcium, Ion 1.12 (*)    All other components within normal limits  I-STAT TROPOININ, ED    EKG  EKG Interpretation  Date/Time:  Saturday September 06 2016 14:14:53 EST Ventricular Rate:  69 PR Interval:    QRS Duration: 97 QT Interval:  406 QTC Calculation: 435 R Axis:   67 Text Interpretation:  Sinus rhythm RAE, consider biatrial enlargement No significant change since last tracing Confirmed by ISAACS MD, Lysbeth Galas [phone]) on 09/06/2016 2:16:29 PM       Radiology Dg Chest 2 View  Result Date: 09/05/2016 CLINICAL DATA:  Initial evaluation for acute chest pain. EXAM: CHEST  2 VIEW COMPARISON:  Prior radiograph from 03/06/2016. FINDINGS: The cardiac and mediastinal silhouettes are stable in size and contour, and remain within normal limits. The lungs are normally inflated. No airspace consolidation, pleural effusion, or pulmonary edema is identified. There is no pneumothorax. No acute osseous abnormality identified. IMPRESSION: No active cardiopulmonary disease.  Electronically Signed   By: Jeannine Boga M.D.   On: 09/05/2016 23:15    Procedures Procedures (including critical care time)  Medications Ordered in ED Medications - No data to display   Initial Impression / Assessment and Plan / ED Course  I have reviewed the triage vital signs and the nursing notes.  Pertinent labs & imaging results that were available during my care of the patient were reviewed by me and considered in my medical decision making (see chart for details).  Clinical Course    50 year old male here with "shaking". Reports this began after he left the ED earlier this morning went home to take a nap.  He did receive injection of dilaudid this morning, however no noted allergy and patient has received this medication multiple times in the past.  Denies EtOH.  On exam, patient is afebrile and nontoxic. He has what appears to be purposeful shaking of both of his hands. When he is asked to perform tasks such as squeezing hand or gripping object, this stops. He also stopped to scratch his head in the middle of exam without any apparent shaking. There are no tremors or active seizure activity.  Patient had full set of lab work performed earlier this morning which I reviewed-- chemistry with mildly low bicarbonate at 16.  Will repeat chemistry here as well as EKG and troponin as patient reported chest pain to triage.  He denies chest or abdominal pain to me during exam.  EKG is nonischemic. Chemistry now is unremarkable. Troponin is negative. On repeat evaluation, patient is no longer "shaking". He has not had any intervention here. Do not suspect this is secondary to acute neurologic, infectious, or cardiac process.  Also do not feel this is medication reaction or acute alcohol withdrawal.  Feel patient is stable for discharge to follow-up with his primary care doctor.  Return precautions were given for any new/worsening symptoms.  Final Clinical Impressions(s) / ED Diagnoses    Final diagnoses:  Shaking    New Prescriptions New Prescriptions   No medications on file     Larene Pickett, Hershal Coria 09/06/16 1533    Duffy Bruce, MD 09/07/16 [phone]

## 2016-09-06 NOTE — ED Triage Notes (Signed)
Pt. Was discharged from our ED this morning with diagnosis of pancreatitis.   Pt. Was given an injection of Dilaudid and when he got home he began to have shaking in his hands and head. He also is having chest pain and abdominal pain. Pt. Having mild nausea

## 2016-09-06 NOTE — Discharge Instructions (Signed)
Please follow-up with your primary care doctor.

## 2016-09-16 ENCOUNTER — Other Ambulatory Visit: Payer: Self-pay | Admitting: Family Medicine

## 2016-09-16 DIAGNOSIS — K859 Acute pancreatitis without necrosis or infection, unspecified: Secondary | ICD-10-CM

## 2016-09-16 MED FILL — PROMETHAZINE 25 MG TABLET: 25 | 10 days supply | Qty: 30 | Fill #1

## 2016-09-16 MED FILL — ?AMLODIPINE BESYLATE 5 MG T: 5 | 30 days supply | Qty: 30 | Fill #7

## 2016-09-16 NOTE — Telephone Encounter (Signed)
Tramadol ready for pick up  

## 2016-09-17 MED FILL — traMADol HCL 50 MG TABS: 50 | 20 days supply | Qty: 60 | Fill #0

## 2016-09-17 NOTE — Telephone Encounter (Signed)
Pt was called and informed of script being ready for pick up.

## 2016-10-29 MED FILL — PROMETHAZINE 25 MG TABLET: 25 | 10 days supply | Qty: 30 | Fill #2

## 2016-10-29 MED FILL — ?AMLODIPINE BESYLATE 5 MG T: 5 | 30 days supply | Qty: 30 | Fill #8

## 2016-11-21 ENCOUNTER — Encounter: Payer: Self-pay | Admitting: Family Medicine

## 2016-11-21 ENCOUNTER — Ambulatory Visit: Payer: Self-pay | Attending: Family Medicine | Admitting: Family Medicine

## 2016-11-21 VITALS — BP 118/90 | HR 77 | Temp 98.1°F | Ht 75.0 in | Wt 149.0 lb

## 2016-11-21 DIAGNOSIS — G479 Sleep disorder, unspecified: Secondary | ICD-10-CM

## 2016-11-21 DIAGNOSIS — G4700 Insomnia, unspecified: Secondary | ICD-10-CM | POA: Insufficient documentation

## 2016-11-21 DIAGNOSIS — K861 Other chronic pancreatitis: Secondary | ICD-10-CM

## 2016-11-21 DIAGNOSIS — F17210 Nicotine dependence, cigarettes, uncomplicated: Secondary | ICD-10-CM | POA: Insufficient documentation

## 2016-11-21 DIAGNOSIS — I10 Essential (primary) hypertension: Secondary | ICD-10-CM

## 2016-11-21 DIAGNOSIS — Z90411 Acquired partial absence of pancreas: Secondary | ICD-10-CM | POA: Insufficient documentation

## 2016-11-21 DIAGNOSIS — K859 Acute pancreatitis without necrosis or infection, unspecified: Secondary | ICD-10-CM

## 2016-11-21 DIAGNOSIS — K219 Gastro-esophageal reflux disease without esophagitis: Secondary | ICD-10-CM

## 2016-11-21 MED ORDER — AMLODIPINE BESYLATE 5 MG PO TABS: 5.0000 mg | ORAL_TABLET | Freq: Every day | ORAL | 11 refills | Status: DC

## 2016-11-21 MED ORDER — PANTOPRAZOLE SODIUM 40 MG PO TBEC: 40.0000 mg | DELAYED_RELEASE_TABLET | Freq: Every day | ORAL | 5 refills | Status: DC

## 2016-11-21 MED ORDER — TRAMADOL HCL 50 MG PO TABS: 50.0000 mg | ORAL_TABLET | Freq: Three times a day (TID) | ORAL | 2 refills | Status: DC | PRN

## 2016-11-21 MED ORDER — TRAZODONE HCL 50 MG PO TABS: 25.0000 mg | ORAL_TABLET | Freq: Every evening | ORAL | 1 refills | Status: DC | PRN

## 2016-11-21 MED ORDER — PROMETHAZINE HCL 25 MG PO TABS: 25.0000 mg | ORAL_TABLET | Freq: Three times a day (TID) | ORAL | 2 refills | Status: DC | PRN

## 2016-11-21 NOTE — Patient Instructions (Addendum)
Brent Rogers was seen today for hypertension.  Diagnoses and all orders for this visit:  Sleep disturbance -     traZODone (DESYREL) 50 MG tablet; Take 0.5-1 tablets (25-50 mg total) by mouth at bedtime as needed for sleep.  Recurrent pancreatitis (HCC) -     traMADol (ULTRAM) 50 MG tablet; Take 1 tablet (50 mg total) by mouth every 8 (eight) hours as needed. -     promethazine (PHENERGAN) 25 MG tablet; Take 1 tablet (25 mg total) by mouth every 8 (eight) hours as needed for nausea or vomiting.  Gastroesophageal reflux disease, esophagitis presence not specified -     pantoprazole (PROTONIX) 40 MG tablet; Take 1 tablet (40 mg total) by mouth daily.  Essential hypertension -     amLODipine (NORVASC) 5 MG tablet; Take 1 tablet (5 mg total) by mouth daily.   OTC supplements Melatonin 0.1-0.3 (up to 0.5) mg nightly chamomile tea lavender oil  X 2-3 weeks patient to call with up dates  Work on sleep hygiene: Bedroom is for  sleep and sex only. If you cannot sleep within 20 minutes do the following: Get out of bed and go to dimly lit room Read a book You may drink water or chamomile unsweetened tea-avoid food and any other beverage Avoid the phone and TV

## 2016-11-21 NOTE — Progress Notes (Signed)
Subjective:  Patient ID: Brent Rogers, male    DOB: [birth year]  Age: 51 y.o. MRN: [id number]  CC: Hypertension   HPI Brent Rogers has hx of recurrent pancreatitis s/p Whipple's procedure, HTN she presents for   1. CHRONIC HYPERTENSION  Disease Monitoring  Blood pressure range: not checking   Chest pain: no   Dyspnea: no   Claudication: no   Medication compliance: yes  Medication Side Effects  Lightheadedness: no   Urinary frequency: no   Edema: no   Impotence: no    2. Recurrent pancreatitis: he denies abdominal pain. He drinks rarely. He is feeling well. No nausea or vomiting.   3. Insomnia: for the past 2 months. He naps during the day and stays awake most of the night. He drinks alcohol or takes OTC sleep aide (likely benadryl) to relax. He admits to slight depressed mood. Denies anxiety. He smokes.   Social History  Substance Use Topics  . Smoking status: Current Every Day Smoker    Packs/day: 0.25    Years: 30.00    Types: Cigarettes  . Smokeless tobacco: Former Systems developer    Types: Snuff     Comment: "used snuff 10-15 years in my 20s-30s"  . Alcohol use 2.4 oz/week    4 Cans of beer per week     Comment: 05/01/16    Outpatient Medications Prior to Visit  Medication Sig Dispense Refill  . amLODipine (NORVASC) 5 MG tablet Take 1 tablet (5 mg total) by mouth daily. 30 tablet 11  . Pancrelipase, Lip-Prot-Amyl, 24000-76000 units CPEP Take 1 capsule (24,000 Units total) by mouth at bedtime. 90 capsule 3  . pantoprazole (PROTONIX) 40 MG tablet Take 1 tablet (40 mg total) by mouth daily. 30 tablet 5  . promethazine (PHENERGAN) 25 MG tablet Take 1 tablet (25 mg total) by mouth every 8 (eight) hours as needed for nausea or vomiting. 30 tablet 2  . traMADol (ULTRAM) 50 MG tablet TAKE 1 TABLET BY MOUTH EVERY 8 HOURS AS NEEDED. 60 tablet 0  . acetaminophen (TYLENOL) 500 MG tablet Take 1 tablet (500 mg total) by mouth every 6 (six) hours as needed for moderate pain  (stomach pain). (Patient not taking: Reported on 09/06/2016) 30 tablet 0  . sucralfate (CARAFATE) 1 g tablet Take 1 tablet (1 g total) by mouth 4 (four) times daily -  with meals and at bedtime. (Patient not taking: Reported on 11/21/2016) 120 tablet 2   No facility-administered medications prior to visit.     ROS Review of Systems  Constitutional: Negative for chills, fatigue, fever and unexpected weight change.  Eyes: Negative for visual disturbance.  Respiratory: Negative for cough and shortness of breath.   Cardiovascular: Negative for chest pain, palpitations and leg swelling.  Gastrointestinal: Negative for abdominal pain, blood in stool, constipation, diarrhea, nausea and vomiting.  Endocrine: Negative for polydipsia, polyphagia and polyuria.  Musculoskeletal: Negative for arthralgias, back pain, gait problem, myalgias and neck pain.  Skin: Negative for rash.  Allergic/Immunologic: Negative for immunocompromised state.  Hematological: Negative for adenopathy. Does not bruise/bleed easily.  Psychiatric/Behavioral: Positive for dysphoric mood and sleep disturbance. Negative for suicidal ideas. The patient is not nervous/anxious.     Objective:  BP 118/90 (BP Location: Left Arm, Patient Position: Sitting, Cuff Size: Small)   Pulse 77   Temp 98.1 F (36.7 C) (Oral)   Ht 6\' 3"  (1.905 m)   Wt 149 lb (67.6 kg)   SpO2 100%   BMI 18.62  kg/m   BP/Weight 11/21/2016 09/06/2016 0000000  Systolic BP 123456 [id number]  Diastolic BP 90 123XX123 93  Wt. (Lbs) 149 - -  BMI 18.62 - -   Wt Readings from Last 3 Encounters:  11/21/16 149 lb (67.6 kg)  09/05/16 155 lb (70.3 kg)  06/30/16 153 lb 6.4 oz (69.6 kg)    Physical Exam  Constitutional: He appears well-developed and well-nourished. No distress.  Thin male   HENT:  Head: Normocephalic and atraumatic.  Neck: Normal range of motion. Neck supple.  Cardiovascular: Normal rate, regular rhythm, normal heart sounds and intact distal pulses.     Pulmonary/Chest: Effort normal and breath sounds normal.  Abdominal: Soft. Bowel sounds are normal. He exhibits no distension and no mass. There is no tenderness. There is no rebound and no guarding.  Musculoskeletal: He exhibits no edema.  Neurological: He is alert.  Skin: Skin is warm and dry. No rash noted. No erythema.  Psychiatric: He has a normal mood and affect.   Depression screen [facility] 2/9 11/21/2016 06/30/2016 05/19/2016  Decreased Interest 0 1 0  Down, Depressed, Hopeless 0 2 0  PHQ - 2 Score 0 3 0  Altered sleeping 3 3 -  Tired, decreased energy 3 2 -  Change in appetite 3 0 -  Feeling bad or failure about yourself  0 0 -  Trouble concentrating 0 0 -  Moving slowly or fidgety/restless 0 0 -  Suicidal thoughts - 0 -  PHQ-9 Score 9 8 -   GAD 7 : Generalized Anxiety Score 11/21/2016 06/30/2016 10/15/2015  Nervous, Anxious, on Edge 3 1 1   Control/stop worrying 3 1 1   Worry too much - different things 3 1 2   Trouble relaxing 3 1 2   Restless 2 1 2   Easily annoyed or irritable 1 0 0  Afraid - awful might happen 0 0 0  Total GAD 7 Score 15 5 8       Assessment & Plan:  Salome was seen today for hypertension.  Diagnoses and all orders for this visit:  Sleep disturbance -     traZODone (DESYREL) 50 MG tablet; Take 0.5-1 tablets (25-50 mg total) by mouth at bedtime as needed for sleep.  Recurrent pancreatitis (HCC) -     traMADol (ULTRAM) 50 MG tablet; Take 1 tablet (50 mg total) by mouth every 8 (eight) hours as needed. -     promethazine (PHENERGAN) 25 MG tablet; Take 1 tablet (25 mg total) by mouth every 8 (eight) hours as needed for nausea or vomiting.  Gastroesophageal reflux disease, esophagitis presence not specified -     pantoprazole (PROTONIX) 40 MG tablet; Take 1 tablet (40 mg total) by mouth daily.  Essential hypertension -     amLODipine (NORVASC) 5 MG tablet; Take 1 tablet (5 mg total) by mouth daily.   There are no diagnoses linked to this  encounter.  No orders of the defined types were placed in this encounter.   Follow-up: Return in about 6 weeks (around 01/02/2017) for trouble sleeping .   Boykin Nearing MD

## 2016-11-23 DIAGNOSIS — G479 Sleep disorder, unspecified: Secondary | ICD-10-CM | POA: Insufficient documentation

## 2016-11-23 NOTE — Assessment & Plan Note (Addendum)
Sleep disturbance with mild depression and moderate anxiety Plan: Trazodone  Sleep hygiene addressed Natural supplements

## 2016-11-23 NOTE — Assessment & Plan Note (Signed)
Well-controlled.  Continue current regimen. 

## 2016-12-05 MED FILL — traZODone HCL 50 MG TABS: 50 | 30 days supply | Qty: 30 | Fill #0

## 2016-12-05 MED FILL — PROMETHAZINE 25 MG TABLET: 25 | 10 days supply | Qty: 30 | Fill #0

## 2017-01-05 ENCOUNTER — Emergency Department (HOSPITAL_COMMUNITY)
Admission: EM | Admit: 2017-01-05 | Discharge: 2017-01-05 | Disposition: A | Discharge status: Home / Self Care | Payer: Self-pay | Attending: Emergency Medicine | Admitting: Emergency Medicine

## 2017-01-05 ENCOUNTER — Emergency Department (HOSPITAL_COMMUNITY): Payer: Self-pay

## 2017-01-05 ENCOUNTER — Encounter (HOSPITAL_COMMUNITY): Payer: Self-pay | Admitting: *Deleted

## 2017-01-05 DIAGNOSIS — I10 Essential (primary) hypertension: Secondary | ICD-10-CM | POA: Insufficient documentation

## 2017-01-05 DIAGNOSIS — R1084 Generalized abdominal pain: Secondary | ICD-10-CM

## 2017-01-05 DIAGNOSIS — Z79899 Other long term (current) drug therapy: Secondary | ICD-10-CM | POA: Insufficient documentation

## 2017-01-05 DIAGNOSIS — F17210 Nicotine dependence, cigarettes, uncomplicated: Secondary | ICD-10-CM | POA: Insufficient documentation

## 2017-01-05 DIAGNOSIS — K529 Noninfective gastroenteritis and colitis, unspecified: Secondary | ICD-10-CM | POA: Insufficient documentation

## 2017-01-05 HISTORY — DX: Benign neoplasm of endocrine pancreas: D13.7

## 2017-01-05 LAB — COMPREHENSIVE METABOLIC PANEL
ALT: 24 U/L (ref 17–63)
AST: 45 U/L — ABNORMAL HIGH (ref 15–41)
Albumin: 3.8 g/dL (ref 3.5–5.0)
Alkaline Phosphatase: 123 U/L (ref 38–126)
Anion gap: 10 (ref 5–15)
BUN: <5 mg/dL — ABNORMAL LOW (ref 6–20)
CO2: 25 mmol/L (ref 22–32)
Calcium: 9 mg/dL (ref 8.9–10.3)
Chloride: 101 mmol/L (ref 101–111)
Creatinine, Ser: 0.77 mg/dL (ref 0.61–1.24)
GFR calc Af Amer: >60 mL/min (ref >60)
GFR calc non Af Amer: >60 mL/min (ref >60)
Glucose, Bld: 103 mg/dL — ABNORMAL HIGH (ref 65–99)
Potassium: 4 mmol/L (ref 3.5–5.1)
Sodium: 136 mmol/L (ref 135–145)
Total Bilirubin: 0.8 mg/dL (ref 0.3–1.2)
Total Protein: 6.7 g/dL (ref 6.5–8.1)

## 2017-01-05 LAB — CBC
HCT: 46.1 % (ref 39.0–52.0)
Hemoglobin: 15.2 g/dL (ref 13.0–17.0)
MCH: 28.7 pg (ref 26.0–34.0)
MCHC: 33 g/dL (ref 30.0–36.0)
MCV: 87.1 fL (ref 78.0–100.0)
Platelets: 232 10*3/uL (ref 150–400)
RBC: 5.29 MIL/uL (ref 4.22–5.81)
RDW: 14.4 % (ref 11.5–15.5)
WBC: 6 10*3/uL (ref 4.0–10.5)

## 2017-01-05 LAB — LIPASE, BLOOD: Lipase: <10 U/L — ABNORMAL LOW (ref 11–51)

## 2017-01-05 LAB — I-STAT TROPONIN, ED: Troponin i, poc: 0 ng/mL (ref 0.00–0.08)

## 2017-01-05 MED ORDER — ONDANSETRON HCL 4 MG/2ML IJ SOLN
4.0000 mg | Freq: Once | INTRAMUSCULAR | Status: AC
  Administered 2017-01-05: 4 mg via INTRAVENOUS
  Filled 2017-01-05: qty 2

## 2017-01-05 MED ORDER — HYDROCODONE-ACETAMINOPHEN 5-325 MG PO TABS: 1.0000 | ORAL_TABLET | Freq: Four times a day (QID) | ORAL | 0 refills | Status: DC | PRN

## 2017-01-05 MED ORDER — ONDANSETRON 4 MG PO TBDP: 4.0000 mg | ORAL_TABLET | Freq: Once | ORAL | Status: DC | PRN

## 2017-01-05 MED ORDER — MORPHINE SULFATE (PF) 4 MG/ML IV SOLN
4.0000 mg | Freq: Once | INTRAVENOUS | Status: AC
  Administered 2017-01-05: 4 mg via INTRAVENOUS
  Filled 2017-01-05: qty 1

## 2017-01-05 MED ORDER — IOPAMIDOL (ISOVUE-300) INJECTION 61%
INTRAVENOUS | Status: AC
  Administered 2017-01-05: 100 mL
  Filled 2017-01-05: qty 100

## 2017-01-05 MED ORDER — SODIUM CHLORIDE 0.9 % IV BOLUS (SEPSIS)
1000.0000 mL | Freq: Once | INTRAVENOUS | Status: AC
Start: 2017-01-05 — End: 2017-01-05
  Administered 2017-01-05: 1000 mL via INTRAVENOUS

## 2017-01-05 MED ORDER — ONDANSETRON 4 MG PO TBDP
ORAL_TABLET | ORAL | Status: AC
  Filled 2017-01-05: qty 1

## 2017-01-05 MED ORDER — ONDANSETRON 4 MG PO TBDP
4.0000 mg | ORAL_TABLET | Freq: Once | ORAL | Status: AC
  Administered 2017-01-05: 4 mg via ORAL

## 2017-01-05 NOTE — Discharge Instructions (Signed)
As discussed, your evaluation today has been largely reassuring.  But, it is important that you monitor your condition carefully, and do not hesitate to return to the ED if you develop new, or concerning changes in your condition.  Otherwise, please follow-up with your physician for appropriate ongoing care.  Be sure to discuss referral to a gastroenterologist

## 2017-01-05 NOTE — ED Notes (Signed)
Pt verbalized understanding of d/c instructions and has no further questions. Pt to follow up with pcp.

## 2017-01-05 NOTE — ED Provider Notes (Signed)
[facility] DEPT Provider Note   CSN: [id number] Arrival date & time: 01/05/17  1319     History   Chief Complaint Chief Complaint  Patient presents with  . Abdominal Pain    Brent Rogers is a 51 y.o. male.  Brent  Patient presents with concern of abdominal pain, nausea, vomiting. Illness began about 3 days ago. Since onset symptoms of been persistent, with worsening diffuse abdominal pain, low back pain. No relief with tramadol. Patient had last episode of vomiting just prior to ED arrival. Patient acknowledges a history of chronic pancreatitis, with occasional exacerbations, states that this constellation of symptoms feels similar.   Past Medical History:  Diagnosis Date  . Benign tumor of endocrine pancreas   . Chronic pancreatitis ([place])    S/P Whipple  . Hypertension   . Migraine    "@ least once/month" (11/12/2015)  . Pancreatic abnormality    CT  shows mass  . Pneumonia 11/12/2015  . Scoliosis     Patient Active Problem List   Diagnosis Date Noted  . Sleep disturbance 11/23/2016  . Foot ulcer with fat layer exposed ([place])   . Foot ulcer, right ([place])   . Foot callus 12/25/2015  . Onychomycosis of toenail 12/25/2015  . Fatty liver 11/12/2015  . GERD (gastroesophageal reflux disease) 11/12/2015  . HTN (hypertension) 07/10/2015  . S/P cholecystectomy 07/10/2015  . Tobacco abuse 12/26/2014  . Protein-calorie malnutrition, severe ([place]) 11/20/2014  . Exocrine pancreatic insufficiency ([place]) 12/23/2013    Past Surgical History:  Procedure Laterality Date  . BACK SURGERY    . EUS N/A 09/21/2013   Procedure: ESOPHAGEAL ENDOSCOPIC ULTRASOUND (EUS) RADIAL;  Surgeon: Beryle Beams, MD;  Location: [facility];  Service: Endoscopy;  Laterality: N/A;  . EUS N/A 09/30/2013   Procedure: UPPER ENDOSCOPIC ULTRASOUND (EUS) LINEAR;  Surgeon: Beryle Beams, MD;  Location: [facility];  Service: Endoscopy;  Laterality: N/A;  . FINE NEEDLE ASPIRATION N/A 09/21/2013    Procedure: FINE NEEDLE ASPIRATION (FNA) LINEAR;  Surgeon: Beryle Beams, MD;  Location: [facility];  Service: Endoscopy;  Laterality: N/A;  . FINGER FRACTURE SURGERY Left 1995   5th digit  . FRACTURE SURGERY    . KNEE ARTHROSCOPY Bilateral 1987-1989   right-left  . LAPAROSCOPY N/A 12/01/2013   Procedure: LAPAROSCOPY DIAGNOSTIC PANCREATICODUODENECTOMY WITH BILIARY AND PANCREATIC STENTS;  Surgeon: Stark Klein, MD;  Location: [facility];  Service: General;  Laterality: N/A;  . LUMBAR [place] SURGERY  1990's  . WHIPPLE PROCEDURE  12/01/2013       Home Medications    Prior to Admission medications   Medication Sig Start Date End Date Taking? Authorizing Provider  amLODipine (NORVASC) 5 MG tablet Take 1 tablet (5 mg total) by mouth daily. 11/21/16  Yes Josalyn Funches, MD  Pancrelipase, Lip-Prot-Amyl, 24000-76000 units CPEP Take 1 capsule (24,000 Units total) by mouth at bedtime. 07/24/16  Yes Josalyn Funches, MD  pantoprazole (PROTONIX) 40 MG tablet Take 1 tablet (40 mg total) by mouth daily. 11/21/16  Yes Boykin Nearing, MD  promethazine (PHENERGAN) 25 MG tablet Take 1 tablet (25 mg total) by mouth every 8 (eight) hours as needed for nausea or vomiting. 11/21/16  Yes Josalyn Funches, MD  traMADol (ULTRAM) 50 MG tablet Take 1 tablet (50 mg total) by mouth every 8 (eight) hours as needed. 11/21/16  Yes Boykin Nearing, MD  traZODone (DESYREL) 50 MG tablet Take 0.5-1 tablets (25-50 mg total) by mouth at bedtime as needed for sleep. 11/21/16  Yes Boykin Nearing, MD    Family History Family History  Problem Relation Age of Onset  . Cancer Mother     unsure  . Hypertension Father     Social History Social History  Substance Use Topics  . Smoking status: Current Every Day Smoker    Packs/day: 0.25    Years: 30.00    Types: Cigarettes  . Smokeless tobacco: Former Systems developer    Types: Snuff     Comment: "used snuff 10-15 years in my 20s-30s"  . Alcohol use 2.4 oz/week    4 Cans of beer per week       Comment: 05/01/16     Allergies   Patient has no known allergies.   Review of Systems Review of Systems  Constitutional:       Per Brent, otherwise negative  HENT:       Per Brent, otherwise negative  Respiratory:       Per Brent, otherwise negative  Cardiovascular:       Per Brent, otherwise negative  Gastrointestinal: Positive for nausea and vomiting.  Endocrine:       Negative aside from Brent  Genitourinary:       Neg aside from Brent   Musculoskeletal:       Per Brent, otherwise negative  Skin: Negative.   Neurological: Negative for syncope.     Physical Exam Updated Vital Signs BP (!) 126/91 (BP Location: Left Arm)   Pulse (!) 58   Temp 98.6 F (37 C) (Oral)   Resp 19   Ht 6\' 3"  (1.905 m)   Wt 150 lb (68 kg)   SpO2 100%   BMI 18.75 kg/m   Physical Exam  Constitutional: He is oriented to person, place, and time. He has a sickly appearance. No distress.  HENT:  Head: Normocephalic and atraumatic.  Eyes: Conjunctivae and EOM are normal.  Cardiovascular: Normal rate and regular rhythm.   Pulmonary/Chest: Effort normal. No stridor. No respiratory distress.  Abdominal: He exhibits no distension.    Musculoskeletal: He exhibits no edema.  Neurological: He is alert and oriented to person, place, and time.  Skin: Skin is warm and dry.  Psychiatric: He has a normal mood and affect.  Nursing note and vitals reviewed.    ED Treatments / Results  Labs (all labs ordered are listed, but only abnormal results are displayed) Labs Reviewed  LIPASE, BLOOD - Abnormal; Notable for the following:       Result Value   Lipase <10 (*)    All other components within normal limits  COMPREHENSIVE METABOLIC PANEL - Abnormal; Notable for the following:    Glucose, Bld 103 (*)    BUN <5 (*)    AST 45 (*)    All other components within normal limits  CBC  URINALYSIS, ROUTINE W REFLEX MICROSCOPIC  I-STAT TROPOININ, ED    EKG  EKG Interpretation  Date/Time:  Monday January 05 2017 13:30:50 EDT Ventricular Rate:  80 PR Interval:  154 QRS Duration: 96 QT Interval:  376 QTC Calculation: 433 R Axis:   70 Text Interpretation:  Normal sinus rhythm Biatrial enlargement No significant change since last tracing Abnormal ekg Confirmed by Carmin Muskrat  MD [phone]) on 01/05/2017 7:01:51 PM       Radiology Dg Chest 2 View  Result Date: 01/05/2017 CLINICAL DATA:  Abdominal and chest pain. EXAM: CHEST  2 VIEW COMPARISON:  09/05/2016 FINDINGS: The heart size and mediastinal contours are within normal limits. Both  lungs are clear. The visualized skeletal structures are unremarkable. IMPRESSION: No active cardiopulmonary disease. Electronically Signed   By: Kathreen Devoid   On: 01/05/2017 14:03   Ct Abdomen Pelvis W Contrast  Result Date: 01/05/2017 CLINICAL DATA:  Nausea with abdominal pain times 2-3 days. History of pancreaticoduodenectomy in 2015. EXAM: CT ABDOMEN AND PELVIS WITH CONTRAST TECHNIQUE: Multidetector CT imaging of the abdomen and pelvis was performed using the standard protocol following bolus administration of intravenous contrast. CONTRAST:  ISOVUE-300 IOPAMIDOL (ISOVUE-300) INJECTION 61% COMPARISON:  None. FINDINGS: Lower chest: Normal visualized cardiac chambers. Clear lung bases. No acute abnormality. Hepatobiliary: Mild hepatic steatosis. Pneumobilia. Cholecystectomy. Pancreas: Atrophic body and tail of the pancreas. Status post Whipple procedure. No pancreatic ductal dilatation. Spleen: No splenomegaly or mass. Adrenals/Urinary Tract: Normal bilateral adrenal glands. Simple cysts within the interpolar aspect of both kidneys on the order of 6-7 mm each. No obstructive uropathy, nephrolithiasis or solid renal mass. The urinary bladder is nonacute. Stomach/Bowel: Status post Whipple procedure with mild fluid-filled distention of small bowel loops with slight fold thickening consistent with small bowel enteritis. No bowel obstruction. Moderate fecal residue within  nonobstructed, nondistended large bowel. Normal-appearing appendix. Vascular/Lymphatic: Aortic atherosclerosis. No enlarged abdominal or pelvic lymph nodes. Reproductive: Prostate and seminal vesicles are unremarkable. Other: No abdominal wall hernia or abnormality. No abdominopelvic ascites. Musculoskeletal: No acute nor suspicious osseous abnormality. Degenerative disc disease L5-S1. IMPRESSION: 1. Status post Whipple procedure. Mild fluid-filled distention of small bowel loops with small fold thickening consistent with small bowel enteritis. 2. Mild hepatic steatosis with pneumobilia consistent with prior surgical intervention. 3. Degenerative disc disease L5-S1. 4. Aortic atherosclerosis without aneurysm. 5. Stable simple cysts within both kidneys. Electronically Signed   By: Ashley Royalty M.D.   On: 01/05/2017 21:27    Procedures Procedures (including critical care time)  Medications Ordered in ED Medications  ondansetron (ZOFRAN-ODT) disintegrating tablet 4 mg (not administered)  ondansetron (ZOFRAN-ODT) 4 MG disintegrating tablet (not administered)  sodium chloride 0.9 % bolus 1,000 mL (not administered)  morphine 4 MG/ML injection 4 mg (not administered)  ondansetron (ZOFRAN) injection 4 mg (not administered)  ondansetron (ZOFRAN-ODT) disintegrating tablet 4 mg (4 mg Oral Given 01/05/17 1334)     Initial Impression / Assessment and Plan / ED Course  I have reviewed the triage vital signs and the nursing notes.  Pertinent labs & imaging results that were available during my care of the patient were reviewed by me and considered in my medical decision making (see chart for details).  10:27 PM  Patient awake and alert, calm. I discussed the patient's findings with him at length, including reassuring CT scan, labs, no evidence for pancreatitis, some suspicion for enteritis. Patient notes that his symptoms have improved since he arrived here. We discussed the importance of following up with  primary care, after initiation of liquid diet, ongoing analgesia, with an additional consideration to follow-up with gastroenterology. With no evidence for bacteremia, sepsis, peritonitis, pancreatitis, patient will be discharged.  Final Clinical Impressions(s) / ED Diagnoses   Final diagnoses:  Generalized abdominal pain  Enteritis    New Prescriptions Discharge Medication List as of 01/05/2017 10:25 PM    START taking these medications   Details  HYDROcodone-acetaminophen (NORCO/VICODIN) 5-325 MG tablet Take 1 tablet by mouth every 6 (six) hours as needed for severe pain., Starting Mon 01/05/2017, Print         Carmin Muskrat, MD 01/05/17 [phone]

## 2017-01-05 NOTE — ED Triage Notes (Signed)
Pt here to triage via [facility] for abdominal and chest pain.  States hx of pancreatitis - admitted  Jan.  Emesis but denies diarrhea.  Increased pain with inspiration.

## 2017-01-05 NOTE — ED Notes (Signed)
Patient transported to CT 

## 2017-01-08 MED FILL — PROMETHAZINE 25 MG TABLET: 25 | 10 days supply | Qty: 30 | Fill #1

## 2017-02-11 ENCOUNTER — Encounter: Payer: Self-pay | Admitting: Family Medicine

## 2017-02-25 ENCOUNTER — Other Ambulatory Visit: Payer: Self-pay | Admitting: Family Medicine

## 2017-02-25 ENCOUNTER — Telehealth: Payer: Self-pay | Admitting: Family Medicine

## 2017-02-25 DIAGNOSIS — K859 Acute pancreatitis without necrosis or infection, unspecified: Secondary | ICD-10-CM

## 2017-02-25 MED FILL — PROMETHAZINE 25 MG TABLET: 25 | 10 days supply | Qty: 30 | Fill #2

## 2017-02-25 NOTE — Telephone Encounter (Signed)
Patient came by the office to request medication refill for Tramadol.   Thank you.

## 2017-02-26 MED ORDER — TRAMADOL HCL 50 MG PO TABS: 50.0000 mg | ORAL_TABLET | Freq: Three times a day (TID) | ORAL | 0 refills | Status: DC | PRN

## 2017-02-26 NOTE — Telephone Encounter (Signed)
Please inform patient that tramadol has been filled for 60, 0 refills  Patient will need appt for additional refills

## 2017-02-27 NOTE — Telephone Encounter (Signed)
Pt was called and informed of script beibng ready for pick up.

## 2017-03-11 MED FILL — AMLODIPINE BESYLATE 5 MG TA: 5 | 30 days supply | Qty: 30 | Fill #0

## 2017-03-11 MED FILL — PANTOPRAZOLE SOD DR 40 MG T: 40 | 30 days supply | Qty: 30 | Fill #0

## 2017-03-14 ENCOUNTER — Emergency Department (HOSPITAL_COMMUNITY): Payer: Self-pay

## 2017-03-14 ENCOUNTER — Encounter (HOSPITAL_COMMUNITY): Payer: Self-pay

## 2017-03-14 ENCOUNTER — Emergency Department (HOSPITAL_COMMUNITY)
Admission: EM | Admit: 2017-03-14 | Discharge: 2017-03-14 | Disposition: A | Discharge status: Home / Self Care | Payer: Self-pay | Attending: Emergency Medicine | Admitting: Emergency Medicine

## 2017-03-14 DIAGNOSIS — M549 Dorsalgia, unspecified: Secondary | ICD-10-CM | POA: Insufficient documentation

## 2017-03-14 DIAGNOSIS — I10 Essential (primary) hypertension: Secondary | ICD-10-CM | POA: Insufficient documentation

## 2017-03-14 DIAGNOSIS — R112 Nausea with vomiting, unspecified: Secondary | ICD-10-CM | POA: Insufficient documentation

## 2017-03-14 DIAGNOSIS — R0789 Other chest pain: Secondary | ICD-10-CM | POA: Insufficient documentation

## 2017-03-14 DIAGNOSIS — R1010 Upper abdominal pain, unspecified: Secondary | ICD-10-CM

## 2017-03-14 DIAGNOSIS — F17210 Nicotine dependence, cigarettes, uncomplicated: Secondary | ICD-10-CM | POA: Insufficient documentation

## 2017-03-14 DIAGNOSIS — R1012 Left upper quadrant pain: Secondary | ICD-10-CM | POA: Insufficient documentation

## 2017-03-14 DIAGNOSIS — Z79899 Other long term (current) drug therapy: Secondary | ICD-10-CM | POA: Insufficient documentation

## 2017-03-14 LAB — URINALYSIS, ROUTINE W REFLEX MICROSCOPIC
Bilirubin Urine: NEGATIVE
Glucose, UA: NEGATIVE mg/dL
Hgb urine dipstick: NEGATIVE
Ketones, ur: NEGATIVE mg/dL
Leukocytes, UA: NEGATIVE
Nitrite: NEGATIVE
Protein, ur: NEGATIVE mg/dL
Specific Gravity, Urine: 1.003 — ABNORMAL LOW (ref 1.005–1.030)
pH: 5 (ref 5.0–8.0)

## 2017-03-14 LAB — CBC
HCT: 36.8 % — ABNORMAL LOW (ref 39.0–52.0)
Hemoglobin: 12.1 g/dL — ABNORMAL LOW (ref 13.0–17.0)
MCH: 28.5 pg (ref 26.0–34.0)
MCHC: 32.9 g/dL (ref 30.0–36.0)
MCV: 86.6 fL (ref 78.0–100.0)
Platelets: 191 10*3/uL (ref 150–400)
RBC: 4.25 MIL/uL (ref 4.22–5.81)
RDW: 15.1 % (ref 11.5–15.5)
WBC: 6.9 10*3/uL (ref 4.0–10.5)

## 2017-03-14 LAB — COMPREHENSIVE METABOLIC PANEL WITH GFR
ALT: 14 U/L — ABNORMAL LOW (ref 17–63)
AST: 25 U/L (ref 15–41)
Albumin: 3.3 g/dL — ABNORMAL LOW (ref 3.5–5.0)
Alkaline Phosphatase: 100 U/L (ref 38–126)
Anion gap: 8 (ref 5–15)
BUN: <5 mg/dL — ABNORMAL LOW (ref 6–20)
CO2: 25 mmol/L (ref 22–32)
Calcium: 8.5 mg/dL — ABNORMAL LOW (ref 8.9–10.3)
Chloride: 103 mmol/L (ref 101–111)
Creatinine, Ser: 0.81 mg/dL (ref 0.61–1.24)
GFR calc Af Amer: >60 mL/min (ref >60)
GFR calc non Af Amer: >60 mL/min (ref >60)
Glucose, Bld: 110 mg/dL — ABNORMAL HIGH (ref 65–99)
Potassium: 3.7 mmol/L (ref 3.5–5.1)
Sodium: 136 mmol/L (ref 135–145)
Total Bilirubin: 0.4 mg/dL (ref 0.3–1.2)
Total Protein: 5.8 g/dL — ABNORMAL LOW (ref 6.5–8.1)

## 2017-03-14 LAB — I-STAT TROPONIN, ED
Troponin i, poc: 0.01 ng/mL (ref 0.00–0.08)
Troponin i, poc: 0.01 ng/mL (ref 0.00–0.08)

## 2017-03-14 LAB — LIPASE, BLOOD: Lipase: 26 U/L (ref 11–51)

## 2017-03-14 MED ORDER — PANTOPRAZOLE SODIUM 40 MG IV SOLR
40.0000 mg | Freq: Once | INTRAVENOUS | Status: AC
  Administered 2017-03-14: 40 mg via INTRAVENOUS
  Filled 2017-03-14: qty 40

## 2017-03-14 MED ORDER — SODIUM CHLORIDE 0.9 % IV BOLUS (SEPSIS)
1000.0000 mL | Freq: Once | INTRAVENOUS | Status: AC
  Administered 2017-03-14: 1000 mL via INTRAVENOUS

## 2017-03-14 MED ORDER — ONDANSETRON HCL 4 MG PO TABS: 4.0000 mg | ORAL_TABLET | Freq: Three times a day (TID) | ORAL | 0 refills | Status: DC | PRN

## 2017-03-14 MED ORDER — ONDANSETRON HCL 4 MG/2ML IJ SOLN
4.0000 mg | Freq: Once | INTRAMUSCULAR | Status: AC
  Administered 2017-03-14: 4 mg via INTRAVENOUS
  Filled 2017-03-14: qty 2

## 2017-03-14 MED ORDER — GI COCKTAIL ~~LOC~~
30.0000 mL | Freq: Once | ORAL | Status: AC
  Administered 2017-03-14: 30 mL via ORAL
  Filled 2017-03-14: qty 30

## 2017-03-14 NOTE — ED Notes (Signed)
Patient transported to X-ray 

## 2017-03-14 NOTE — ED Notes (Signed)
Patient able to ambulate independently  

## 2017-03-14 NOTE — ED Provider Notes (Signed)
[facility] DEPT Provider Note   CSN: [id number] Arrival date & time: 03/14/17  1228     History   Chief Complaint Chief Complaint  Patient presents with  . Back Pain  . Chest Pain    HPI Brent Rogers is a 51 y.o. male.  HPI [facility] has history of chronic pancreatitis presenting with abdominal pain radiating to his back for one week. States pain is similar to his previous exacerbations of pancreatitis. Associated with nausea and vomiting. States vomitus has been yellow in color. No diarrhea. Denies melanotic or recent bloody stools. No fever or chills. Patient since he does have some pain that radiates up into his chest. No shortness of breath or cough. Currently is having no chest pain. Past Medical History:  Diagnosis Date  . Benign tumor of endocrine pancreas   . Chronic pancreatitis ([place])    S/P Whipple  . Hypertension   . Migraine    "@ least once/month" (11/12/2015)  . Pancreatic abnormality    CT  shows mass  . Pneumonia 11/12/2015  . Scoliosis     Patient Active Problem List   Diagnosis Date Noted  . Sleep disturbance 11/23/2016  . Foot ulcer with fat layer exposed ([place])   . Foot ulcer, right ([place])   . Foot callus 12/25/2015  . Onychomycosis of toenail 12/25/2015  . Fatty liver 11/12/2015  . GERD (gastroesophageal reflux disease) 11/12/2015  . HTN (hypertension) 07/10/2015  . S/P cholecystectomy 07/10/2015  . Tobacco abuse 12/26/2014  . Protein-calorie malnutrition, severe ([place]) 11/20/2014  . Exocrine pancreatic insufficiency ([place]) 12/23/2013    Past Surgical History:  Procedure Laterality Date  . BACK SURGERY    . EUS N/A 09/21/2013   Procedure: ESOPHAGEAL ENDOSCOPIC ULTRASOUND (EUS) RADIAL;  Surgeon: Beryle Beams, MD;  [facility];  Service: Endoscopy;  Laterality: N/A;  . EUS N/A 09/30/2013   Procedure: UPPER ENDOSCOPIC ULTRASOUND (EUS) LINEAR;  Surgeon: Beryle Beams, MD;  [facility];  Service: Endoscopy;  Laterality:  N/A;  . FINE NEEDLE ASPIRATION N/A 09/21/2013   Procedure: FINE NEEDLE ASPIRATION (FNA) LINEAR;  Surgeon: Beryle Beams, MD;  [facility];  Service: Endoscopy;  Laterality: N/A;  . FINGER FRACTURE SURGERY Left 1995   5th digit  . FRACTURE SURGERY    . KNEE ARTHROSCOPY Bilateral 1987-1989   right-left  . LAPAROSCOPY N/A 12/01/2013   Procedure: LAPAROSCOPY DIAGNOSTIC PANCREATICODUODENECTOMY WITH BILIARY AND PANCREATIC STENTS;  Surgeon: Stark Klein, MD;  Location: [facility];  Service: General;  Laterality: N/A;  . LUMBAR [place] SURGERY  1990's  . WHIPPLE PROCEDURE  12/01/2013       Home Medications    Prior to Admission medications   Medication Sig Start Date End Date Taking? Authorizing Provider  acetaminophen (TYLENOL) 500 MG tablet Take 1,000 mg by mouth every 6 (six) hours as needed for moderate pain or headache.   Yes [provider]  amLODipine (NORVASC) 5 MG tablet Take 1 tablet (5 mg total) by mouth daily. 11/21/16  Yes Funches, Josalyn, MD  Pancrelipase, Lip-Prot-Amyl, 24000-76000 units CPEP Take 1 capsule (24,000 Units total) by mouth at bedtime. 07/24/16  Yes Funches, Josalyn, MD  pantoprazole (PROTONIX) 40 MG tablet Take 1 tablet (40 mg total) by mouth daily. 11/21/16  Yes Funches, Adriana Mccallum, MD  promethazine (PHENERGAN) 25 MG tablet Take 1 tablet (25 mg total) by mouth every 8 (eight) hours as needed for nausea or vomiting. 11/21/16  Yes Funches, Josalyn, MD  traMADol (ULTRAM) 50 MG  tablet Take 1 tablet (50 mg total) by mouth every 8 (eight) hours as needed. Patient taking differently: Take 50 mg by mouth every 8 (eight) hours as needed for moderate pain.  02/26/17  Yes Funches, Adriana Mccallum, MD  traZODone (DESYREL) 50 MG tablet Take 0.5-1 tablets (25-50 mg total) by mouth at bedtime as needed for sleep. 11/21/16  Yes Funches, Adriana Mccallum, MD  ondansetron (ZOFRAN) 4 MG tablet Take 1 tablet (4 mg total) by mouth every 8 (eight) hours as needed for nausea or vomiting. 03/14/17    Julianne Rice, MD    Family History Family History  Problem Relation Age of Onset  . Cancer Mother        unsure  . Hypertension Father     Social History Social History  Substance Use Topics  . Smoking status: Current Every Day Smoker    Packs/day: 0.25    Years: 30.00    Types: Cigarettes  . Smokeless tobacco: Former Systems developer    Types: Snuff     Comment: "used snuff 10-15 years in my 20s-30s"  . Alcohol use 2.4 oz/week    4 Cans of beer per week     Comment: 05/01/16     Allergies   Patient has no known allergies.   Review of Systems Review of Systems  Constitutional: Negative for chills and fever.  Respiratory: Negative for cough and shortness of breath.   Cardiovascular: Positive for chest pain. Negative for palpitations and leg swelling.  Gastrointestinal: Positive for abdominal pain, nausea and vomiting. Negative for blood in stool, constipation and diarrhea.  Genitourinary: Positive for flank pain. Negative for dysuria, frequency and hematuria.  Musculoskeletal: Positive for back pain and myalgias. Negative for neck pain and neck stiffness.  Skin: Negative for rash and wound.  Neurological: Negative for dizziness, weakness, light-headedness, numbness and headaches.  All other systems reviewed and are negative.    Physical Exam Updated Vital Signs BP (!) 123/95   Pulse 68   Temp 98.1 F (36.7 C) (Oral)   Resp 13   Ht 6\' 3"  (1.905 m)   Wt 68 kg (150 lb)   SpO2 99%   BMI 18.75 kg/m   Physical Exam  Constitutional: He is oriented to person, place, and time. He appears well-developed and well-nourished. No distress.  HENT:  Head: Normocephalic and atraumatic.  Mouth/Throat: Oropharynx is clear and moist. No oropharyngeal exudate.  Eyes: EOM are normal. Pupils are equal, round, and reactive to light.  Neck: Normal range of motion. Neck supple.  Cardiovascular: Normal rate and regular rhythm.  Exam reveals no gallop and no friction rub.   No murmur  heard. Pulmonary/Chest: Effort normal and breath sounds normal. No respiratory distress. He has no wheezes. He has no rales. He exhibits no tenderness.  Abdominal: Soft. Bowel sounds are normal. There is tenderness (mild epigastric and left upper quadrant tenderness to palpation.). There is no rebound and no guarding.  Musculoskeletal: Normal range of motion. He exhibits no edema or tenderness.  Patient with left lumbar paraspinal tenderness. Questional left CVA tenderness. No midline thoracic or lumbar tenderness. Distal pulses are 2+. No lower extremity swelling, asymmetry or pain.  Neurological: He is alert and oriented to person, place, and time.  Patient is alert and oriented x3 with clear, goal oriented speech. Patient has 5/5 motor in all extremities. Sensation is intact to light touch.  Skin: Skin is warm and dry. Capillary refill takes less than 2 seconds. No rash noted. He is not diaphoretic. No  erythema.  Psychiatric: He has a normal mood and affect. His behavior is normal.  Nursing note and vitals reviewed.    ED Treatments / Results  Labs (all labs ordered are listed, but only abnormal results are displayed) Labs Reviewed  CBC - Abnormal; Notable for the following:       Result Value   Hemoglobin 12.1 (*)    HCT 36.8 (*)    All other components within normal limits  COMPREHENSIVE METABOLIC PANEL - Abnormal; Notable for the following:    Glucose, Bld 110 (*)    BUN <5 (*)    Calcium 8.5 (*)    Total Protein 5.8 (*)    Albumin 3.3 (*)    ALT 14 (*)    All other components within normal limits  URINALYSIS, ROUTINE W REFLEX MICROSCOPIC - Abnormal; Notable for the following:    Color, Urine STRAW (*)    Specific Gravity, Urine 1.003 (*)    All other components within normal limits  LIPASE, BLOOD  I-STAT TROPOININ, ED  I-STAT TROPOININ, ED    EKG  EKG Interpretation  Date/Time:  Saturday March 14 2017 12:42:05 EDT Ventricular Rate:  68 PR Interval:    QRS  Duration: 83 QT Interval:  403 QTC Calculation: 429 R Axis:   75 Text Interpretation:  Sinus rhythm ST elev, probable normal early repol pattern Confirmed by Lita Mains  MD, Berdine Rasmusson ([id number]) on 03/14/2017 2:22:10 PM       Radiology Dg Chest 2 View  Result Date: 03/14/2017 CLINICAL DATA:  Chest pain EXAM: CHEST  2 VIEW COMPARISON:  01/05/2017 FINDINGS: The heart size and mediastinal contours are within normal limits. Both lungs are clear. The visualized skeletal structures are unremarkable. IMPRESSION: No active cardiopulmonary disease. Electronically Signed   By: Kerby Moors M.D.   On: 03/14/2017 13:54    Procedures Procedures (including critical care time)  Medications Ordered in ED Medications  sodium chloride 0.9 % bolus 1,000 mL (0 mLs Intravenous Stopped 03/14/17 1508)  gi cocktail (Maalox,Lidocaine,Donnatal) (30 mLs Oral Given 03/14/17 1332)  ondansetron (ZOFRAN) injection 4 mg (4 mg Intravenous Given 03/14/17 1333)  pantoprazole (PROTONIX) injection 40 mg (40 mg Intravenous Given 03/14/17 1335)     Initial Impression / Assessment and Plan / ED Course  I have reviewed the triage vital signs and the nursing notes.  Pertinent labs & imaging results that were available during my care of the patient were reviewed by me and considered in my medical decision making (see chart for details).    Patient is resting comfortably. Tolerating liquids with no vomiting. States his abdominal pain has improved. Recent CT abdomen and pelvis. She does not believe repeat imaging is necessary at this time. Chest pain is atypical and likely related to his gastrointestinal complaints. Troponin 2 is normal. Discharged home to follow-up with his primary physician. Return precautions given.   Final Clinical Impressions(s) / ED Diagnoses   Final diagnoses:  Upper abdominal pain  Atypical chest pain    New Prescriptions Discharge Medication List as of 03/14/2017  4:31 PM    START taking these  medications   Details  ondansetron (ZOFRAN) 4 MG tablet Take 1 tablet (4 mg total) by mouth every 8 (eight) hours as needed for nausea or vomiting., Starting Sat 03/14/2017, Print         Julianne Rice, MD 03/15/17 [phone]

## 2017-03-14 NOTE — ED Triage Notes (Signed)
Pt brought in by EMS due to having back pain that radiates to chest. Pt has hx of CA and HTN. Pt given nitrox1 and pain not relieved. Pt a&ox4.

## 2017-03-14 NOTE — ED Notes (Signed)
Got patient undress on the monitor did ekg shown to Dr Lita Mains

## 2017-03-19 ENCOUNTER — Emergency Department (HOSPITAL_COMMUNITY)
Admission: EM | Admit: 2017-03-19 | Discharge: 2017-03-19 | Disposition: A | Discharge status: Home / Self Care | Payer: Self-pay | Attending: Emergency Medicine | Admitting: Emergency Medicine

## 2017-03-19 ENCOUNTER — Emergency Department (HOSPITAL_COMMUNITY): Payer: Self-pay

## 2017-03-19 ENCOUNTER — Inpatient Hospital Stay (HOSPITAL_COMMUNITY)
Admission: EM | Admit: 2017-03-19 | Discharge: 2017-03-23 | DRG: 439 | Disposition: A | Discharge status: Home / Self Care | Payer: Self-pay | Attending: Family Medicine | Admitting: Family Medicine

## 2017-03-19 ENCOUNTER — Encounter (HOSPITAL_COMMUNITY): Payer: Self-pay

## 2017-03-19 DIAGNOSIS — F17210 Nicotine dependence, cigarettes, uncomplicated: Secondary | ICD-10-CM | POA: Diagnosis present

## 2017-03-19 DIAGNOSIS — K8689 Other specified diseases of pancreas: Secondary | ICD-10-CM | POA: Diagnosis present

## 2017-03-19 DIAGNOSIS — E860 Dehydration: Secondary | ICD-10-CM | POA: Diagnosis present

## 2017-03-19 DIAGNOSIS — K859 Acute pancreatitis without necrosis or infection, unspecified: Principal | ICD-10-CM | POA: Diagnosis present

## 2017-03-19 DIAGNOSIS — K8500 Idiopathic acute pancreatitis without necrosis or infection: Secondary | ICD-10-CM | POA: Insufficient documentation

## 2017-03-19 DIAGNOSIS — Z681 Body mass index (BMI) 19 or less, adult: Secondary | ICD-10-CM

## 2017-03-19 DIAGNOSIS — K8681 Exocrine pancreatic insufficiency: Secondary | ICD-10-CM | POA: Diagnosis present

## 2017-03-19 DIAGNOSIS — I10 Essential (primary) hypertension: Secondary | ICD-10-CM | POA: Insufficient documentation

## 2017-03-19 DIAGNOSIS — E869 Volume depletion, unspecified: Secondary | ICD-10-CM

## 2017-03-19 DIAGNOSIS — E440 Moderate protein-calorie malnutrition: Secondary | ICD-10-CM | POA: Diagnosis present

## 2017-03-19 DIAGNOSIS — Z79899 Other long term (current) drug therapy: Secondary | ICD-10-CM

## 2017-03-19 DIAGNOSIS — K861 Other chronic pancreatitis: Secondary | ICD-10-CM | POA: Diagnosis present

## 2017-03-19 LAB — CBC WITH DIFFERENTIAL/PLATELET
Basophils Absolute: 0 10*3/uL (ref 0.0–0.1)
Basophils Absolute: 0 10*3/uL (ref 0.0–0.1)
Basophils Relative: 0 %
Basophils Relative: 0 %
Eosinophils Absolute: 0 10*3/uL (ref 0.0–0.7)
Eosinophils Absolute: 0 10*3/uL (ref 0.0–0.7)
Eosinophils Relative: 0 %
Eosinophils Relative: 0 %
HCT: 43.2 % (ref 39.0–52.0)
HCT: 44.1 % (ref 39.0–52.0)
Hemoglobin: 14.6 g/dL (ref 13.0–17.0)
Hemoglobin: 15.1 g/dL (ref 13.0–17.0)
Lymphocytes Relative: 10 %
Lymphocytes Relative: 12 %
Lymphs Abs: 1.1 10*3/uL (ref 0.7–4.0)
Lymphs Abs: 1.3 10*3/uL (ref 0.7–4.0)
MCH: 29.4 pg (ref 26.0–34.0)
MCH: 30 pg (ref 26.0–34.0)
MCHC: 33.8 g/dL (ref 30.0–36.0)
MCHC: 34.2 g/dL (ref 30.0–36.0)
MCV: 87.1 fL (ref 78.0–100.0)
MCV: 87.5 fL (ref 78.0–100.0)
Monocytes Absolute: 1.5 10*3/uL — ABNORMAL HIGH (ref 0.1–1.0)
Monocytes Absolute: 1.2 10*3/uL — ABNORMAL HIGH (ref 0.1–1.0)
Monocytes Relative: 14 %
Monocytes Relative: 12 %
Neutro Abs: 8.1 10*3/uL — ABNORMAL HIGH (ref 1.7–7.7)
Neutro Abs: 7.8 10*3/uL — ABNORMAL HIGH (ref 1.7–7.7)
Neutrophils Relative %: 76 %
Neutrophils Relative %: 76 %
Platelets: 264 10*3/uL (ref 150–400)
Platelets: 290 10*3/uL (ref 150–400)
RBC: 4.96 MIL/uL (ref 4.22–5.81)
RBC: 5.04 MIL/uL (ref 4.22–5.81)
RDW: 15.3 % (ref 11.5–15.5)
RDW: 15.4 % (ref 11.5–15.5)
WBC: 10.7 10*3/uL — ABNORMAL HIGH (ref 4.0–10.5)
WBC: 10.3 10*3/uL (ref 4.0–10.5)

## 2017-03-19 LAB — BASIC METABOLIC PANEL
Anion gap: 8 (ref 5–15)
BUN: 8 mg/dL (ref 6–20)
CO2: 28 mmol/L (ref 22–32)
Calcium: 9.2 mg/dL (ref 8.9–10.3)
Chloride: 100 mmol/L — ABNORMAL LOW (ref 101–111)
Creatinine, Ser: 0.79 mg/dL (ref 0.61–1.24)
GFR calc Af Amer: >60 mL/min (ref >60)
GFR calc non Af Amer: >60 mL/min (ref >60)
Glucose, Bld: 114 mg/dL — ABNORMAL HIGH (ref 65–99)
Potassium: 3.6 mmol/L (ref 3.5–5.1)
Sodium: 136 mmol/L (ref 135–145)

## 2017-03-19 LAB — HEPATIC FUNCTION PANEL
ALT: 12 U/L — ABNORMAL LOW (ref 17–63)
AST: 17 U/L (ref 15–41)
Albumin: 3.4 g/dL — ABNORMAL LOW (ref 3.5–5.0)
Alkaline Phosphatase: 137 U/L — ABNORMAL HIGH (ref 38–126)
Bilirubin, Direct: 0.2 mg/dL (ref 0.1–0.5)
Indirect Bilirubin: 0.6 mg/dL (ref 0.3–0.9)
Total Bilirubin: 0.8 mg/dL (ref 0.3–1.2)
Total Protein: 6.8 g/dL (ref 6.5–8.1)

## 2017-03-19 LAB — COMPREHENSIVE METABOLIC PANEL
ALT: 15 U/L — ABNORMAL LOW (ref 17–63)
AST: 21 U/L (ref 15–41)
Albumin: 4 g/dL (ref 3.5–5.0)
Alkaline Phosphatase: 154 U/L — ABNORMAL HIGH (ref 38–126)
Anion gap: 9 (ref 5–15)
BUN: 7 mg/dL (ref 6–20)
CO2: 28 mmol/L (ref 22–32)
Calcium: 9.7 mg/dL (ref 8.9–10.3)
Chloride: 101 mmol/L (ref 101–111)
Creatinine, Ser: 0.84 mg/dL (ref 0.61–1.24)
GFR calc Af Amer: >60 mL/min (ref >60)
GFR calc non Af Amer: >60 mL/min (ref >60)
Glucose, Bld: 107 mg/dL — ABNORMAL HIGH (ref 65–99)
Potassium: 3.7 mmol/L (ref 3.5–5.1)
Sodium: 138 mmol/L (ref 135–145)
Total Bilirubin: 0.7 mg/dL (ref 0.3–1.2)
Total Protein: 7.9 g/dL (ref 6.5–8.1)

## 2017-03-19 LAB — I-STAT TROPONIN, ED: Troponin i, poc: 0 ng/mL (ref 0.00–0.08)

## 2017-03-19 LAB — LIPASE, BLOOD
Lipase: 23 U/L (ref 11–51)
Lipase: 23 U/L (ref 11–51)

## 2017-03-19 MED ORDER — ONDANSETRON HCL 4 MG/2ML IJ SOLN
4.0000 mg | Freq: Once | INTRAMUSCULAR | Status: AC
  Administered 2017-03-19: 4 mg via INTRAVENOUS
  Filled 2017-03-19: qty 2

## 2017-03-19 MED ORDER — HYDROMORPHONE HCL 1 MG/ML IJ SOLN
1.0000 mg | Freq: Once | INTRAMUSCULAR | Status: AC
  Administered 2017-03-19: 1 mg via INTRAVENOUS
  Filled 2017-03-19: qty 1

## 2017-03-19 MED ORDER — IOPAMIDOL (ISOVUE-300) INJECTION 61%
100.0000 mL | Freq: Once | INTRAVENOUS | Status: AC | PRN
  Administered 2017-03-19: 100 mL via INTRAVENOUS

## 2017-03-19 MED ORDER — MORPHINE SULFATE (PF) 4 MG/ML IV SOLN
4.0000 mg | Freq: Once | INTRAVENOUS | Status: AC
  Administered 2017-03-19: 4 mg via INTRAVENOUS
  Filled 2017-03-19: qty 1

## 2017-03-19 MED ORDER — PROMETHAZINE HCL 25 MG PO TABS: 25.0000 mg | ORAL_TABLET | Freq: Three times a day (TID) | ORAL | 0 refills | Status: DC | PRN

## 2017-03-19 MED ORDER — IOPAMIDOL (ISOVUE-300) INJECTION 61%
INTRAVENOUS | Status: AC
  Filled 2017-03-19: qty 100

## 2017-03-19 MED ORDER — SODIUM CHLORIDE 0.9 % IV BOLUS (SEPSIS)
1000.0000 mL | Freq: Once | INTRAVENOUS | Status: AC
Start: 2017-03-19 — End: 2017-03-19
  Administered 2017-03-19: 1000 mL via INTRAVENOUS

## 2017-03-19 MED ORDER — SODIUM CHLORIDE 0.9 % IV BOLUS (SEPSIS)
1000.0000 mL | Freq: Once | INTRAVENOUS | Status: AC
  Administered 2017-03-19: 1000 mL via INTRAVENOUS

## 2017-03-19 NOTE — ED Provider Notes (Signed)
[facility] DEPT Provider Note   CSN: [id number] Arrival date & time: 03/19/17  6578     History   Chief Complaint Chief Complaint  Patient presents with  . Chest Pain  . Back Pain  . Nausea  . Weakness    HPI Brent Rogers is a 51 y.o. male.  HPI   51 year old male with chronic pancreatitis status post Whipple procedure, hypertension, migraine presenting complaining of chest pain, abd pain and back pain. Patient reports gradual onset of pain to his chest, abdomen, and back ongoing for the past 6 days. Described his chest pain is substernal, burning sensation, constant, seems to be improving with acid reducing medication. However, his abdominal pain which he described as a sharp, burning persistent sensation radiates to his lower back with post prandial pain, decreased appetite, associate nausea and vomiting. States he has vomited 4 times since yesterday with nonbloody nonbilious content. He has not had a bowel movement in 4 days despite having the urge to defecate. Unable to pass flatus. He noticed decreased urinary output. States he normally has at least once BM per day. No prior history of SBO. Also endorsed having subjective fever, and sweats. Patient denies headache, shortness of breath, hematuria, joint pain, altered mental status, focal numbness or weakness. No history of AAA. No significant cardiac history aside of hypertension which he takes medication for that.   Past Medical History:  Diagnosis Date  . Benign tumor of endocrine pancreas   . Chronic pancreatitis ([place])    S/P Whipple  . Hypertension   . Migraine    "@ least once/month" (11/12/2015)  . Pancreatic abnormality    CT  shows mass  . Pneumonia 11/12/2015  . Scoliosis     Patient Active Problem List   Diagnosis Date Noted  . Sleep disturbance 11/23/2016  . Foot ulcer with fat layer exposed ([place])   . Foot ulcer, right ([place])   . Foot callus 12/25/2015  . Onychomycosis of toenail 12/25/2015  . Fatty  liver 11/12/2015  . GERD (gastroesophageal reflux disease) 11/12/2015  . HTN (hypertension) 07/10/2015  . S/P cholecystectomy 07/10/2015  . Tobacco abuse 12/26/2014  . Protein-calorie malnutrition, severe ([place]) 11/20/2014  . Exocrine pancreatic insufficiency ([place]) 12/23/2013    Past Surgical History:  Procedure Laterality Date  . BACK SURGERY    . EUS N/A 09/21/2013   Procedure: ESOPHAGEAL ENDOSCOPIC ULTRASOUND (EUS) RADIAL;  Surgeon: Beryle Beams, MD;  Location: [facility];  Service: Endoscopy;  Laterality: N/A;  . EUS N/A 09/30/2013   Procedure: UPPER ENDOSCOPIC ULTRASOUND (EUS) LINEAR;  Surgeon: Beryle Beams, MD;  Location: [facility];  Service: Endoscopy;  Laterality: N/A;  . FINE NEEDLE ASPIRATION N/A 09/21/2013   Procedure: FINE NEEDLE ASPIRATION (FNA) LINEAR;  Surgeon: Beryle Beams, MD;  [facility]: [facility];  Service: Endoscopy;  Laterality: N/A;  . FINGER FRACTURE SURGERY Left 1995   5th digit  . FRACTURE SURGERY    . KNEE ARTHROSCOPY Bilateral 1987-1989   right-left  . LAPAROSCOPY N/A 12/01/2013   Procedure: LAPAROSCOPY DIAGNOSTIC PANCREATICODUODENECTOMY WITH BILIARY AND PANCREATIC STENTS;  Surgeon: Stark Klein, MD;  Location: [facility];  Service: General;  Laterality: N/A;  . LUMBAR [place] SURGERY  1990's  . WHIPPLE PROCEDURE  12/01/2013       Home Medications    Prior to Admission medications   Medication Sig Start Date End Date Taking? Authorizing Provider  acetaminophen (TYLENOL) 500 MG tablet Take 1,000 mg by mouth every 6 (six) hours as needed  for moderate pain or headache.   Yes [provider]  amLODipine (NORVASC) 5 MG tablet Take 1 tablet (5 mg total) by mouth daily. 11/21/16  Yes Funches, Josalyn, MD  Pancrelipase, Lip-Prot-Amyl, 24000-76000 units CPEP Take 1 capsule (24,000 Units total) by mouth at bedtime. 07/24/16  Yes Funches, Josalyn, MD  pantoprazole (PROTONIX) 40 MG tablet Take 1 tablet (40 mg total) by mouth daily. 11/21/16  Yes Funches,  Adriana Mccallum, MD  promethazine (PHENERGAN) 25 MG tablet Take 1 tablet (25 mg total) by mouth every 8 (eight) hours as needed for nausea or vomiting. 11/21/16  Yes Funches, Josalyn, MD  traMADol (ULTRAM) 50 MG tablet Take 1 tablet (50 mg total) by mouth every 8 (eight) hours as needed. Patient taking differently: Take 50 mg by mouth every 8 (eight) hours as needed for moderate pain.  02/26/17  Yes Funches, Adriana Mccallum, MD  traZODone (DESYREL) 50 MG tablet Take 0.5-1 tablets (25-50 mg total) by mouth at bedtime as needed for sleep. 11/21/16  Yes Funches, Josalyn, MD  ondansetron (ZOFRAN) 4 MG tablet Take 1 tablet (4 mg total) by mouth every 8 (eight) hours as needed for nausea or vomiting. Patient not taking: Reported on 03/19/2017 03/14/17   Julianne Rice, MD    Family History Family History  Problem Relation Age of Onset  . Cancer Mother        unsure  . Hypertension Father     Social History Social History  Substance Use Topics  . Smoking status: Current Every Day Smoker    Packs/day: 0.25    Years: 30.00    Types: Cigarettes  . Smokeless tobacco: Former Systems developer    Types: Snuff     Comment: "used snuff 10-15 years in my 20s-30s"  . Alcohol use 2.4 oz/week    4 Cans of beer per week     Comment: 05/01/16     Allergies   Patient has no known allergies.   Review of Systems Review of Systems  All other systems reviewed and are negative.    Physical Exam Updated Vital Signs BP (!) 123/98 (BP Location: Left Arm)   Pulse 83   Temp 98.1 F (36.7 C) (Oral)   Resp 14   Ht 6\' 3"  (1.905 m)   Wt 68 kg (150 lb)   SpO2 100%   BMI 18.75 kg/m   Physical Exam  Constitutional: He is oriented to person, place, and time. No distress.  Cachectic appearing male nontoxic in appearance, laying in bed.  HENT:  Head: Atraumatic.  Eyes: Conjunctivae are normal. No scleral icterus.  Neck: Neck supple.  Cardiovascular: Normal rate, regular rhythm and intact distal pulses.   Pulmonary/Chest:  Effort normal and breath sounds normal.  Abdominal: Soft. Bowel sounds are normal. He exhibits no distension. There is tenderness (Diffuse abdominal tenderness without guarding or rebound tenderness. No obvious mass, no pulsatile mass). There is no guarding.  Normal-appearing surgical scar in abdominal wall  Genitourinary:  Genitourinary Comments: Chaperone present during exam. Normal rectal tone, no obvious stool impaction, no obvious mass, normal color stool on glove. Scant amount of stool noted.  Neurological: He is alert and oriented to person, place, and time.  Skin: No rash noted.  Psychiatric: He has a normal mood and affect.  Nursing note and vitals reviewed.    ED Treatments / Results  Labs (all labs ordered are listed, but only abnormal results are displayed) Labs Reviewed  BASIC METABOLIC PANEL - Abnormal; Notable for the following:  Result Value   Chloride 100 (*)    Glucose, Bld 114 (*)    All other components within normal limits  CBC WITH DIFFERENTIAL/PLATELET - Abnormal; Notable for the following:    WBC 10.7 (*)    Neutro Abs 8.1 (*)    Monocytes Absolute 1.5 (*)    All other components within normal limits  HEPATIC FUNCTION PANEL - Abnormal; Notable for the following:    Albumin 3.4 (*)    ALT 12 (*)    Alkaline Phosphatase 137 (*)    All other components within normal limits  LIPASE, BLOOD  URINALYSIS, ROUTINE W REFLEX MICROSCOPIC  I-STAT TROPOININ, ED  POC OCCULT BLOOD, ED    EKG  EKG Interpretation None     ED ECG REPORT   Date: 03/19/2017  Rate: 82  Rhythm: normal sinus rhythm  QRS Axis: normal  Intervals: normal  ST/T Wave abnormalities: early repolarization  Conduction Disutrbances:none  Narrative Interpretation:   Old EKG Reviewed: unchanged  I have personally reviewed the EKG tracing and agree with the computerized printout as noted.   Radiology Dg Chest 2 View  Result Date: 03/19/2017 CLINICAL DATA:  Left lateral chest wall  pain and upper abdominal pain for the last 8-9 days. The patient ports being an occasional smoker. EXAM: CHEST  2 VIEW COMPARISON:  Chest x-ray of March 14, 2017 FINDINGS: The lungs are well-expanded and clear. The heart and pulmonary vascularity are normal. There is no pleural effusion, pneumothorax, or pneumomediastinum. The observed portions of the upper abdomen are normal. The bony thorax is unremarkable. IMPRESSION: There is no acute cardiopulmonary abnormality. Electronically Signed   By: David  Martinique M.D.   On: 03/19/2017 10:58   Ct Abdomen Pelvis W Contrast  Result Date: 03/19/2017 CLINICAL DATA:  Upper abdominal pain, nausea. EXAM: CT ABDOMEN AND PELVIS WITH CONTRAST TECHNIQUE: Multidetector CT imaging of the abdomen and pelvis was performed using the standard protocol following bolus administration of intravenous contrast. CONTRAST:  140mL ISOVUE-300 IOPAMIDOL (ISOVUE-300) INJECTION 61% COMPARISON:  CT scan of January 05, 2017. FINDINGS: Lower chest: No acute abnormality. Hepatobiliary: Status post cholecystectomy. Fatty infiltration of the liver is noted. Stable left hepatic pneumobilia. Pancreas: Status post surgical resection of pancreatic consistent with Whipple's procedure. Focal thickening of the pancreas at the junction of the body and tail is noted concerning for focal inflammation. Spleen: Normal in size without focal abnormality. Adrenals/Urinary Tract: Adrenal glands are unremarkable. Kidneys are normal, without renal calculi, focal lesion, or hydronephrosis. Urinary bladder is decompressed. Stomach/Bowel: Postsurgical changes is seen involving the stomach consistent with Whipple procedure. There is no abnormal bowel dilatation or definite inflammation. The appendix appears normal. Vascular/Lymphatic: Aortic atherosclerosis. No enlarged abdominal or pelvic lymph nodes. Reproductive: Prostate is unremarkable. Other: No abdominal wall hernia or abnormality. No abdominopelvic ascites.  Musculoskeletal: No acute or significant osseous findings. IMPRESSION: Status post Whipple procedure. Fatty infiltration of the liver. Stable left hepatic pneumobilia most likely due to surgery. Aortic atherosclerosis. Focal thickening of the pancreas at the junction of the body and tail is noted concerning for acute pancreatitis, although neoplasm cannot be excluded. Correlation with physical findings to laboratory data is recommended, and MRI may be performed for further evaluation as well. Electronically Signed   By: Marijo Conception, M.D.   On: 03/19/2017 12:22    Procedures Procedures (including critical care time)  Medications Ordered in ED Medications  iopamidol (ISOVUE-300) 61 % injection (not administered)  morphine 4 MG/ML injection 4 mg (4 mg  Intravenous Given 03/19/17 1117)  ondansetron (ZOFRAN) injection 4 mg (4 mg Intravenous Given 03/19/17 1118)  sodium chloride 0.9 % bolus 1,000 mL (1,000 mLs Intravenous New Bag/Given 03/19/17 1118)  iopamidol (ISOVUE-300) 61 % injection 100 mL (100 mLs Intravenous Contrast Given 03/19/17 1153)     Initial Impression / Assessment and Plan / ED Course  I have reviewed the triage vital signs and the nursing notes.  Pertinent labs & imaging results that were available during my care of the patient were reviewed by me and considered in my medical decision making (see chart for details).     BP (!) 123/98 (BP Location: Left Arm)   Pulse 83   Temp 98.1 F (36.7 C) (Oral)   Resp 14   Ht 6\' 3"  (1.905 m)   Wt 68 kg (150 lb)   SpO2 100%   BMI 18.75 kg/m    Final Clinical Impressions(s) / ED Diagnoses   Final diagnoses:  Idiopathic acute pancreatitis, unspecified complication status    New Prescriptions Discharge Medication List as of 03/19/2017 12:58 PM     10:40 AM Patient here with progressive worsening lower abdomen pain. Radiates to back. History of chronic pancreatitis and this pain feels similar. Concerning symptoms including  increased nausea vomiting and lack of bowel movement. He has had prior abdominal surgery does increase the risk of SBO. Will obtain abdominal pelvic CT scan for further evaluation. Low suspicion for aortic dissection given his history. IV fluid and pain medication given as well as antinausea medication.  12:51 PM Labs with normal Lipase, cardiac work up unremarkable. WBC 10.7, electrolytes panel are reassuring.  CT scan shows focal thickening of the pancreas at the junction of the body and tail, concerning for acute pancreatitis, although neoplasm cannot be excluded.  MRI recommended.  Pt does report weight loss and night sweats.  He still endorse abd pain and feeling nauseous.  However, pt request to be discharge due to having obligation of taking someone to work.  I did offer admission for symptom treatment and further cancer work up.  Pt understand to return if his condition worsen.  GI referral given as needed.  Recommend f/u with PCP as well.      Domenic Moras, PA-C 03/19/17 [place], [place], DO 03/22/17 [phone]

## 2017-03-19 NOTE — ED Provider Notes (Signed)
[facility] DEPT Provider Note   CSN: [id number] Arrival date & time: 03/19/17  1803     History   Chief Complaint Chief Complaint  Patient presents with  . Chest Pain  . Abdominal Pain  . Emesis    HPI Brent Rogers is a 51 y.o. male.  HPI  Patient presents with concern of ongoing upper abdominal pain, nausea, vomiting, by mouth intolerance. Patient has a notable history of chronic pancreatitis, status post Whipple procedure. This episode of pain began a couple days ago, since onset has been persistent, with severe sharp pain in the epigastrium. Patient is intolerant of oral pain medication. Patient was actually here earlier today, and his initial evaluation was concerning for acute on chronic pancreatitis, patient had to leave to attend to a family matter, but returns due to ongoing pain.   Past Medical History:  Diagnosis Date  . Benign tumor of endocrine pancreas   . Chronic pancreatitis ([place])    S/P Whipple  . Hypertension   . Migraine    "@ least once/month" (11/12/2015)  . Pancreatic abnormality    CT  shows mass  . Pneumonia 11/12/2015  . Scoliosis     Patient Active Problem List   Diagnosis Date Noted  . Sleep disturbance 11/23/2016  . Foot ulcer with fat layer exposed ([place])   . Foot ulcer, right ([place])   . Foot callus 12/25/2015  . Onychomycosis of toenail 12/25/2015  . Fatty liver 11/12/2015  . GERD (gastroesophageal reflux disease) 11/12/2015  . HTN (hypertension) 07/10/2015  . S/P cholecystectomy 07/10/2015  . Tobacco abuse 12/26/2014  . Protein-calorie malnutrition, severe ([place]) 11/20/2014  . Exocrine pancreatic insufficiency ([place]) 12/23/2013    Past Surgical History:  Procedure Laterality Date  . BACK SURGERY    . EUS N/A 09/21/2013   Procedure: ESOPHAGEAL ENDOSCOPIC ULTRASOUND (EUS) RADIAL;  Surgeon: Beryle Beams, MD;  [facility];  Service: Endoscopy;  Laterality: N/A;  . EUS N/A 09/30/2013   Procedure: UPPER ENDOSCOPIC  ULTRASOUND (EUS) LINEAR;  Surgeon: Beryle Beams, MD;  [facility];  Service: Endoscopy;  Laterality: N/A;  . FINE NEEDLE ASPIRATION N/A 09/21/2013   Procedure: FINE NEEDLE ASPIRATION (FNA) LINEAR;  Surgeon: Beryle Beams, MD;  [facility];  Service: Endoscopy;  Laterality: N/A;  . FINGER FRACTURE SURGERY Left 1995   5th digit  . FRACTURE SURGERY    . KNEE ARTHROSCOPY Bilateral 1987-1989   right-left  . LAPAROSCOPY N/A 12/01/2013   Procedure: LAPAROSCOPY DIAGNOSTIC PANCREATICODUODENECTOMY WITH BILIARY AND PANCREATIC STENTS;  Surgeon: Stark Klein, MD;  Location: [facility];  Service: General;  Laterality: N/A;  . LUMBAR [place] SURGERY  1990's  . WHIPPLE PROCEDURE  12/01/2013       Home Medications    Prior to Admission medications   Medication Sig Start Date End Date Taking? Authorizing Provider  acetaminophen (TYLENOL) 500 MG tablet Take 1,000 mg by mouth every 6 (six) hours as needed for moderate pain or headache.    [provider]  amLODipine (NORVASC) 5 MG tablet Take 1 tablet (5 mg total) by mouth daily. 11/21/16   Funches, Adriana Mccallum, MD  ondansetron (ZOFRAN) 4 MG tablet Take 1 tablet (4 mg total) by mouth every 8 (eight) hours as needed for nausea or vomiting. Patient not taking: Reported on 03/19/2017 03/14/17   Julianne Rice, MD  Pancrelipase, Lip-Prot-Amyl, 24000-76000 units CPEP Take 1 capsule (24,000 Units total) by mouth at bedtime. 07/24/16   Boykin Nearing, MD  pantoprazole (PROTONIX)  40 MG tablet Take 1 tablet (40 mg total) by mouth daily. 11/21/16   Boykin Nearing, MD  promethazine (PHENERGAN) 25 MG tablet Take 1 tablet (25 mg total) by mouth every 8 (eight) hours as needed for nausea or vomiting. 03/19/17   Domenic Moras, PA-C  traMADol (ULTRAM) 50 MG tablet Take 1 tablet (50 mg total) by mouth every 8 (eight) hours as needed. Patient taking differently: Take 50 mg by mouth every 8 (eight) hours as needed for moderate pain.  02/26/17   Funches,  Adriana Mccallum, MD  traZODone (DESYREL) 50 MG tablet Take 0.5-1 tablets (25-50 mg total) by mouth at bedtime as needed for sleep. 11/21/16   Boykin Nearing, MD    Family History Family History  Problem Relation Age of Onset  . Cancer Mother        unsure  . Hypertension Father     Social History Social History  Substance Use Topics  . Smoking status: Current Every Day Smoker    Packs/day: 0.25    Years: 30.00    Types: Cigarettes  . Smokeless tobacco: Former Systems developer    Types: Snuff     Comment: "used snuff 10-15 years in my 20s-30s"  . Alcohol use 2.4 oz/week    4 Cans of beer per week     Comment: 05/01/16     Allergies   Patient has no known allergies.   Review of Systems Review of Systems  Constitutional:       Per HPI, otherwise negative  HENT:       Per HPI, otherwise negative  Respiratory:       Per HPI, otherwise negative  Cardiovascular:       Per HPI, otherwise negative  Gastrointestinal: Negative for vomiting.  Endocrine:       Negative aside from HPI  Genitourinary:       Neg aside from HPI   Musculoskeletal:       Per HPI, otherwise negative  Skin: Negative.   Neurological: Negative for syncope.     Physical Exam Updated Vital Signs BP (!) 126/99 (BP Location: Left Wrist)   Pulse 91   Temp 98.3 F (36.8 C) (Oral)   Resp 16   Ht 6\' 3"  (1.905 m)   Wt 68 kg (150 lb)   SpO2 100%   BMI 18.75 kg/m   Physical Exam  Constitutional: He is oriented to person, place, and time. He appears well-developed. No distress.  HENT:  Head: Normocephalic and atraumatic.  Eyes: Conjunctivae and EOM are normal.  Cardiovascular: Normal rate and regular rhythm.   Pulmonary/Chest: Effort normal. No stridor. No respiratory distress.  Abdominal: He exhibits no distension. There is tenderness in the epigastric area.  Musculoskeletal: He exhibits no edema.  Neurological: He is alert and oriented to person, place, and time.  Skin: Skin is warm and dry.  Psychiatric:  He has a normal mood and affect.  Nursing note and vitals reviewed.    ED Treatments / Results  Labs (all labs ordered are listed, but only abnormal results are displayed) Labs Reviewed  COMPREHENSIVE METABOLIC PANEL  LIPASE, BLOOD  CBC WITH DIFFERENTIAL/PLATELET    EKG  EKG Interpretation  Date/Time:  Thursday March 19 2017 18:14:56 EDT Ventricular Rate:  85 PR Interval:    QRS Duration: 82 QT Interval:  362 QTC Calculation: 431 R Axis:   67 Text Interpretation:  Sinus rhythm RAE, consider biatrial enlargement ST elev, probable normal early repol pattern No significant change since last tracing  Abnormal ekg Confirmed by Carmin Muskrat [id number]) on 03/19/2017 8:24:01 PM       Radiology Dg Chest 2 View  Result Date: 03/19/2017 CLINICAL DATA:  Left lateral chest wall pain and upper abdominal pain for the last 8-9 days. The patient ports being an occasional smoker. EXAM: CHEST  2 VIEW COMPARISON:  Chest x-ray of March 14, 2017 FINDINGS: The lungs are well-expanded and clear. The heart and pulmonary vascularity are normal. There is no pleural effusion, pneumothorax, or pneumomediastinum. The observed portions of the upper abdomen are normal. The bony thorax is unremarkable. IMPRESSION: There is no acute cardiopulmonary abnormality. Electronically Signed   By: David  Martinique M.D.   On: 03/19/2017 10:58   Ct Abdomen Pelvis W Contrast  Result Date: 03/19/2017 CLINICAL DATA:  Upper abdominal pain, nausea. EXAM: CT ABDOMEN AND PELVIS WITH CONTRAST TECHNIQUE: Multidetector CT imaging of the abdomen and pelvis was performed using the standard protocol following bolus administration of intravenous contrast. CONTRAST:  144mL ISOVUE-300 IOPAMIDOL (ISOVUE-300) INJECTION 61% COMPARISON:  CT scan of January 05, 2017. FINDINGS: Lower chest: No acute abnormality. Hepatobiliary: Status post cholecystectomy. Fatty infiltration of the liver is noted. Stable left hepatic pneumobilia. Pancreas: Status post  surgical resection of pancreatic consistent with Whipple's procedure. Focal thickening of the pancreas at the junction of the body and tail is noted concerning for focal inflammation. Spleen: Normal in size without focal abnormality. Adrenals/Urinary Tract: Adrenal glands are unremarkable. Kidneys are normal, without renal calculi, focal lesion, or hydronephrosis. Urinary bladder is decompressed. Stomach/Bowel: Postsurgical changes is seen involving the stomach consistent with Whipple procedure. There is no abnormal bowel dilatation or definite inflammation. The appendix appears normal. Vascular/Lymphatic: Aortic atherosclerosis. No enlarged abdominal or pelvic lymph nodes. Reproductive: Prostate is unremarkable. Other: No abdominal wall hernia or abnormality. No abdominopelvic ascites. Musculoskeletal: No acute or significant osseous findings. IMPRESSION: Status post Whipple procedure. Fatty infiltration of the liver. Stable left hepatic pneumobilia most likely due to surgery. Aortic atherosclerosis. Focal thickening of the pancreas at the junction of the body and tail is noted concerning for acute pancreatitis, although neoplasm cannot be excluded. Correlation with physical findings to laboratory data is recommended, and MRI may be performed for further evaluation as well. Electronically Signed   By: Marijo Conception, M.D.   On: 03/19/2017 12:22    Procedures Procedures (including critical care time)  Medications Ordered in ED Medications  sodium chloride 0.9 % bolus 1,000 mL (not administered)  HYDROmorphone (DILAUDID) injection 1 mg (not administered)   Chart review notable for multiple evaluations for pancreatitis, including earlier today.  Initial Impression / Assessment and Plan / ED Course  I have reviewed the triage vital signs and the nursing notes.  Pertinent labs & imaging results that were available during my care of the patient were reviewed by me and considered in my medical decision  making (see chart for details).  Patient with chronic pancreatitis presents with flare of worsening abdominal pain, nausea, vomiting, by mouth intolerance. The remainder of the patient's abdominal exam is reassuring, and the absence of fever is also reassuring, but with CT evidence of acute pancreatitis, patient started on IV fluids, received IV analgesia, as well as antiemetics, and was admitted for further evaluation and management.   Final Clinical Impressions(s) / ED Diagnoses  Acute on chronic pancreatitis   Carmin Muskrat, MD 03/19/17 2249

## 2017-03-19 NOTE — ED Triage Notes (Signed)
Patient reports that he has been having chest pain after eating for approx 9 days. Patient also c/o nausea and upper abdominal pain. Patient states he has been having back pain x 9 days that is constant.

## 2017-03-19 NOTE — ED Triage Notes (Signed)
Patient was seen earlier today  with the same symptoms today and diagnosed with pancreatitis. Patient was suppose to be admitted, but had to return a family members car so they could go to work.

## 2017-03-19 NOTE — Discharge Instructions (Signed)
Your pain is likely due to pancreatitis.  You would benefit from further evaluation of your condition, including abdominal MRI to assess for potential cancer.  Please follow up with your doctor for further evaluation.  If your symptoms worsen please return to the ER for further care.

## 2017-03-19 NOTE — H&P (Signed)
[facility] History and Physical  ORVEL MACIOCE ZOX:[id number] DOB: [birth year] DOA: 03/19/2017  Referring physician: Dr Jeraldine Loots PCP: Dessa Phi, MD   Chief Complaint: Abd pain  HPI: Brent Rogers is a 51 y.o. male w/ hx or recurrent/ chronic pancreatitis, HTN and sp Whipple's procedure for benign tumor, presents to ED today with 2-3 day history of left sided abd pain, nausea and vomiting with also back pain.  In ED lipase was 23 (normal) but CT showed inflammatory pancreas changes.  Pt in a lot of pain. Asked to see for admission.    Pt had eval in March 2014 for abd pain / acute pancreatitis and was found to have a large mass at the head of the pancreas.  Had Whipple's procedure for this and the tumor was benign according to old records.  Since then has had a number of admissions for acute/ chronic pancreatitis episodes.  Has been drinking for many years (beer) but says now he only drinks about 1 beer every 1-2 months.  Presenting now with L sided abd pain wrapping around to his back, worse with food and especially greasy foods. Feels weak and dehydrated per pt.  Occ tob, occ etoh. +N/V, no diarrhea, no CP/ SOB.   Pt went to [employer] and was [employer] All[employer]American for basketball. Went to [employer] and played baseball and basketball.  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  Then got married to a [place] girl and moved to [place]. Worked as a Geologist, engineering.  Lives alone now in [place], no children.    Chart: Dec '14 - abd pain, N/V, panc mass on imaging; worrisome for malignancy large mass.  For EUS biopsy on 12/24 as OP w/ GI Mar '15 - abd pain after recent Whipple's procedure for benign tumor; presenting w N/V, treated with NG tube Sept '15 - hx Whipple's in Mar 2015 for benign tumor, path showed "groove" pancreatitis', here with N/V and abd pain LUQ; treated conservatively and improved. Beer drinker off and on.  Jan '16 -  abd pain w/ lipase 76, CT neg for acute panc; pt stabilized and was dc'd home Feb '16 - N/V, lipase 142, CT showing acute pancreatitis; etiology unclear, not drinking etoh, GB already out.  Pain resolved with conservative Rx.  Dc on creon.  Feb '17 - abd pain refractory, N/V, also ^K+ and AKI.  Lipase 96.  Nontoxic. CT showed fatty liver only. Diet advanced.  AKI better.  IVF's given.  Jun '17 - epigastric pain, acute pancreatitis from ETOH use ; treated with IVF and NPO, improved. Crean added, diet advanced Aug '17 - abd pain acute onset w/ N/V, no diarrhea.  Drank "several beers" the day before on his birthday. Klebsiella bacteremia; UCx neg and CT abd w/o abscess or pseudocyst.  Per ID dc'd on po augmentin.  Source felt to be pt's feet / hx foot ulcera and callouses.      ROS  denies CP  no joint pain   no HA  no blurry vision  no rash  no dysuria  no difficulty voiding  no change in urine color    Past Medical History  Past Medical History:  Diagnosis Date  . Benign tumor of endocrine pancreas   . Chronic pancreatitis (HCC)    S/P Whipple  . Hypertension   . Migraine    "@ least once/month" (11/12/2015)  . Pancreatic abnormality    CT  shows  mass  . Pneumonia 11/12/2015  . Scoliosis    Past Surgical History  Past Surgical History:  Procedure Laterality Date  . BACK SURGERY    . EUS N/A 09/21/2013   Procedure: ESOPHAGEAL ENDOSCOPIC ULTRASOUND (EUS) RADIAL;  Surgeon: Theda Belfast, MD;  Location: [facility];  Service: Endoscopy;  Laterality: N/A;  . EUS N/A 09/30/2013   Procedure: UPPER ENDOSCOPIC ULTRASOUND (EUS) LINEAR;  Surgeon: Theda Belfast, MD;  Location: [facility];  Service: Endoscopy;  Laterality: N/A;  . FINE NEEDLE ASPIRATION N/A 09/21/2013   Procedure: FINE NEEDLE ASPIRATION (FNA) LINEAR;  Surgeon: Theda Belfast, MD;  Location: [facility];  Service: Endoscopy;  Laterality: N/A;  . FINGER FRACTURE SURGERY Left 1995   5th digit  . FRACTURE SURGERY    .  KNEE ARTHROSCOPY Bilateral 1987-1989   right-left  . LAPAROSCOPY N/A 12/01/2013   Procedure: LAPAROSCOPY DIAGNOSTIC PANCREATICODUODENECTOMY WITH BILIARY AND PANCREATIC STENTS;  Surgeon: Almond Lint, MD;  Location: [facility];  Service: General;  Laterality: N/A;  . LUMBAR DISC SURGERY  1990's  . WHIPPLE PROCEDURE  12/01/2013   Family History  Family History  Problem Relation Age of Onset  . Cancer Mother        unsure  . Hypertension Father    Social History  reports that he has been smoking Cigarettes.  He has a 7.50 pack-year smoking history. He has quit using smokeless tobacco. His smokeless tobacco use included Snuff. He reports that he drinks about 2.4 oz of alcohol per week . He reports that he does not use drugs. Allergies No Known Allergies Home medications Prior to Admission medications   Medication Sig Start Date End Date Taking? Authorizing Provider  acetaminophen (TYLENOL) 500 MG tablet Take 1,000 mg by mouth every 6 (six) hours as needed for moderate pain or headache.    [provider]  amLODipine (NORVASC) 5 MG tablet Take 1 tablet (5 mg total) by mouth daily. 11/21/16   Funches, Gerilyn Nestle, MD  ondansetron (ZOFRAN) 4 MG tablet Take 1 tablet (4 mg total) by mouth every 8 (eight) hours as needed for nausea or vomiting. Patient not taking: Reported on 03/19/2017 03/14/17   Loren Racer, MD  Pancrelipase, Lip-Prot-Amyl, 24000-76000 units CPEP Take 1 capsule (24,000 Units total) by mouth at bedtime. 07/24/16   Funches, Gerilyn Nestle, MD  pantoprazole (PROTONIX) 40 MG tablet Take 1 tablet (40 mg total) by mouth daily. 11/21/16   Dessa Phi, MD  promethazine (PHENERGAN) 25 MG tablet Take 1 tablet (25 mg total) by mouth every 8 (eight) hours as needed for nausea or vomiting. 03/19/17   Fayrene Helper, PA-C  traMADol (ULTRAM) 50 MG tablet Take 1 tablet (50 mg total) by mouth every 8 (eight) hours as needed. Patient taking differently: Take 50 mg by mouth every 8 (eight) hours as needed  for moderate pain.  02/26/17   Funches, Gerilyn Nestle, MD  traZODone (DESYREL) 50 MG tablet Take 0.5-1 tablets (25-50 mg total) by mouth at bedtime as needed for sleep. 11/21/16   Dessa Phi, MD   Liver Function Tests  Recent Labs Lab 03/14/17 1247 03/19/17 1008 03/19/17 2050  AST 25 17 21   ALT 14* 12* 15*  ALKPHOS 100 137* 154*  BILITOT 0.4 0.8 0.7  PROT 5.8* 6.8 7.9  ALBUMIN 3.3* 3.4* 4.0    Recent Labs Lab 03/14/17 1247 03/19/17 1008 03/19/17 2050  LIPASE 26 23 23    CBC  Recent Labs Lab 03/14/17 1247 03/19/17 1008 03/19/17 2050  WBC 6.9 10.7*  10.3  NEUTROABS  --  8.1* 7.8*  HGB 12.1* 14.6 15.1  HCT 36.8* 43.2 44.1  MCV 86.6 87.1 87.5  PLT 191 264 290   Basic Metabolic Panel  Recent Labs Lab 03/14/17 1247 03/19/17 0929 03/19/17 2050  NA 136 136 138  K 3.7 3.6 3.7  CL 103 100* 101  CO2 25 28 28   GLUCOSE 110* 114* 107*  BUN <5* 8 7  CREATININE 0.81 0.79 0.84  CALCIUM 8.5* 9.2 9.7     Vitals:   03/19/17 1813 03/19/17 1814 03/19/17 2151  BP:  (!) 126/99 (!) 134/98  Pulse:  91 64  Resp:  16 18  Temp: 98.3 F (36.8 C) 98.3 F (36.8 C)   TempSrc:  Oral   SpO2:  100% 100%  Weight:  68 kg (150 lb)   Height:  6\' 3"  (1.905 m)    Exam: Gen thin WDWN AAM no distress, looks uncomfortable No rash, cyanosis or gangrene Sclera anicteric, throat clear and slightly dry  No jvd or bruits Chest clear bilat RRR no MRG Abd soft ntnd no mass or ascites +bs, tender in L mid and upper quadrant GU normal male MS no joint effusions or deformity Ext no LE edema / no wounds or ulcers Neuro is alert, Ox 3 , nf   Na 136  K 3.7  CO2 28   BUN 7  Cr 0.84  Ca 9.7   Alb 4.0  Lipase 23  AST 17  ALT 12 LFT's ok  Trop 0.00    wBC 10k  Hb 15  plt 290  EKG (independ reviewed): Sinus rhythm RAE, consider biatrial enlargement ST elev, probable normal early repol pattern No significant change since last tracing  CXR (independ reviewed) > no acute  abnormality   Assessment: 1  Abd pain - due to acute on chronic pancreatitis. Lipase wnl but CT + for inflammation.  Unclear etiology.  Past etoh drinker but not drinking actively now. Has had recurrent problems with this .   2  Hx Whipple's procedure 2015 (benign tumor removed) 3  HTN cont norvasc 4  Vol depletion  Plan - admit, NPO, IV pain meds, IVF's and antiemetics, follow clinically      Neville Pauls D [facility] Pager [phone]   If 7PM-7AM, please contact night-coverage [email or web address] Password [facility] 03/19/2017, 11:16 PM

## 2017-03-20 DIAGNOSIS — E440 Moderate protein-calorie malnutrition: Secondary | ICD-10-CM

## 2017-03-20 DIAGNOSIS — K859 Acute pancreatitis without necrosis or infection, unspecified: Secondary | ICD-10-CM

## 2017-03-20 DIAGNOSIS — E869 Volume depletion, unspecified: Secondary | ICD-10-CM

## 2017-03-20 LAB — BASIC METABOLIC PANEL WITH GFR
Anion gap: 8 (ref 5–15)
BUN: 6 mg/dL (ref 6–20)
CO2: 27 mmol/L (ref 22–32)
Calcium: 8.8 mg/dL — ABNORMAL LOW (ref 8.9–10.3)
Chloride: 103 mmol/L (ref 101–111)
Creatinine, Ser: 0.71 mg/dL (ref 0.61–1.24)
GFR calc Af Amer: >60 mL/min (ref >60)
GFR calc non Af Amer: >60 mL/min (ref >60)
Glucose, Bld: 102 mg/dL — ABNORMAL HIGH (ref 65–99)
Potassium: 3.8 mmol/L (ref 3.5–5.1)
Sodium: 138 mmol/L (ref 135–145)

## 2017-03-20 MED ORDER — ONDANSETRON HCL 4 MG/2ML IJ SOLN
4.0000 mg | Freq: Four times a day (QID) | INTRAMUSCULAR | Status: DC | PRN
  Administered 2017-03-20 – 2017-03-21 (×5): 4 mg via INTRAVENOUS
  Filled 2017-03-20 (×5): qty 2

## 2017-03-20 MED ORDER — HYDRALAZINE HCL 20 MG/ML IJ SOLN: 10.0000 mg | Freq: Four times a day (QID) | INTRAMUSCULAR | Status: DC | PRN

## 2017-03-20 MED ORDER — ONDANSETRON HCL 4 MG PO TABS
4.0000 mg | ORAL_TABLET | Freq: Four times a day (QID) | ORAL | Status: DC | PRN
  Administered 2017-03-21: 4 mg via ORAL
  Filled 2017-03-20: qty 1

## 2017-03-20 MED ORDER — POTASSIUM CHLORIDE IN NACL 20-0.9 MEQ/L-% IV SOLN
INTRAVENOUS | Status: DC
  Administered 2017-03-20 (×2): via INTRAVENOUS
  Administered 2017-03-20: 1000 mL via INTRAVENOUS
  Administered 2017-03-21 – 2017-03-22 (×6): via INTRAVENOUS
  Filled 2017-03-20 (×13): qty 1000

## 2017-03-20 MED ORDER — ENOXAPARIN SODIUM 40 MG/0.4ML ~~LOC~~ SOLN
40.0000 mg | SUBCUTANEOUS | Status: DC
  Administered 2017-03-20 – 2017-03-23 (×4): 40 mg via SUBCUTANEOUS
  Filled 2017-03-20 (×4): qty 0.4

## 2017-03-20 MED ORDER — AMLODIPINE BESYLATE 5 MG PO TABS
5.0000 mg | ORAL_TABLET | Freq: Every day | ORAL | Status: DC
  Administered 2017-03-20 – 2017-03-23 (×4): 5 mg via ORAL
  Filled 2017-03-20 (×5): qty 1

## 2017-03-20 MED ORDER — TRAZODONE HCL 50 MG PO TABS
25.0000 mg | ORAL_TABLET | Freq: Every evening | ORAL | Status: DC | PRN
  Administered 2017-03-20 – 2017-03-22 (×3): 50 mg via ORAL
  Filled 2017-03-20 (×4): qty 1

## 2017-03-20 MED ORDER — HYDROMORPHONE HCL 1 MG/ML IJ SOLN
1.0000 mg | INTRAMUSCULAR | Status: DC | PRN
  Administered 2017-03-20 – 2017-03-21 (×6): 2 mg via INTRAVENOUS
  Filled 2017-03-20 (×6): qty 2

## 2017-03-20 MED ORDER — BOOST / RESOURCE BREEZE PO LIQD
1.0000 | Freq: Three times a day (TID) | ORAL | Status: DC
  Administered 2017-03-20 – 2017-03-23 (×8): 1 via ORAL

## 2017-03-20 NOTE — Progress Notes (Signed)
Nursing Note: Received report from ED Nurse L-Beth,RN.wbb

## 2017-03-20 NOTE — Progress Notes (Signed)
Initial Nutrition Assessment  DOCUMENTATION CODES:   Non-severe (moderate) malnutrition in context of chronic illness  INTERVENTION:   Boost Breeze po TID, each supplement provides 250 kcal and 9 grams of protein  NUTRITION DIAGNOSIS:   Malnutrition (moderate) related to chronic illness (chronic pancreatitis ) as evidenced by moderate depletion of body fat, moderate depletions of muscle mass, 10 percent weight loss in <4 months.  GOAL:   Patient will meet greater than or equal to 90% of their needs  MONITOR:   PO intake, Supplement acceptance, Labs, Weight trends  REASON FOR ASSESSMENT:   Malnutrition Screening Tool    ASSESSMENT:   51 y.o. male w/ hx or recurrent/ chronic pancreatitis, HTN and sp Whipple's procedure for benign tumor, presents to ED today with 2-3 day history of left sided abd pain, nausea and vomiting with also back pain. Pt had eval in March 2014 for abd pain / acute pancreatitis and was found to have a large mass at the head of the pancreas.  Had Whipple's procedure for this and the tumor was benign according to old records.  Since then has had a number of admissions for acute/ chronic pancreatitis episodes.  Has been drinking for many years (beer) but says now he only drinks about 1 beer every 1-2 months.  Met with pt in room today. Pt reports poor appetite, nausea, and vomiting for 1 week pta. Pt is currently NPO but reports that he is hungry and would like to try broth. RD will contact MD about possibly starting clears. Per chart, pt has lost 14lbs(10%) over the past four months; this is significant. Pt reports he weighed 185lbs a couple of years ago. Pt does not like milk based supplements; RD will order Boost Breeze.    Medications reviewed and include: lovenox, NaCl w/ KCl, hydromorphone, zofran   Labs reviewed: Ca 8.8(L)  Nutrition-Focused physical exam completed. Findings are moderate fat depletion in arms and chest, moderate muscle depletion in  clavicles and shoulders, severe muscle depletion in legs and no edema. Pt reports that he has always been thin.   Diet Order:  Diet NPO time specified  Skin:  Reviewed, no issues  Last BM:  6/19  Height:   Ht Readings from Last 1 Encounters:  03/20/17 _0  (1.905 m)    Weight:   Wt Readings from Last 1 Encounters:  03/20/17 135 lb 4.8 oz (61.4 kg)    Ideal Body Weight:  89 kg  BMI:  Body mass index is 16.91 kg/m.  Estimated Nutritional Needs:   Kcal:  2100-2400kcal/day   Protein:  110-123g/day   Fluid:  >2.1L/day   EDUCATION NEEDS:   Education needs addressed  Koleen Distance MS, RD, LDN Pager #[phone]

## 2017-03-20 NOTE — Progress Notes (Signed)
Nursing Note: Pt arrived via stretcher and able to stand and ambulate to the bed.Pt went to bathroom and voided.Wanted to void before his fluids were re-connected.Vitals taken by NT T-98.2 p-53 Bp 155/102 R-18 A; re-checked manually for 140/80 p-57.Pt resting quietly in bed.Oriented to room,comunication board and pt aware that he is currently NPO.wbb

## 2017-03-20 NOTE — Progress Notes (Signed)
Patient Demographics:    Brent Rogers, is a 51 y.o. male, DOB - 10-13-1965, DGU:[id number]  Admit date - 03/19/2017   Admitting Physician Delano Metz, MD  Outpatient Primary MD for the patient is Dessa Phi, MD  LOS - 0   Chief Complaint  Patient presents with  . Chest Pain  . Abdominal Pain  . Emesis        Subjective:    Brent Rogers today has no fevers, no emesis,  No chest pain,  abd pain is better,    Assessment  & Plan :    Active Problems:   Exocrine pancreatic insufficiency (HCC)   Acute on chronic pancreatitis (HCC)   HTN (hypertension)   Volume depletion   Malnutrition of moderate degree  Brief summary: Brent Rogers is a 51 y.o. male w/ hx or recurrent/ chronic pancreatitis, HTN and sp Whipple's procedure for benign tumor who was admitted on 03/19/2017 with acute on chronic pancreatitis.  nausea and vomiting with also back pain.  In ED lipase was 23 (normal) but CT showed inflammatory pancreas changes.  Pt in a lot of pain. Asked to see for admission.    Plan:- 1)Acute on Chronic Pancreatitis- CT abdomen and pelvis suggest proctitis, lipase is not elevated which is not surprising given chronic pancreatitis.  No further emesis, abdominal pain is improving, patient requested oral intake, we will try clear liquid diet. Patient denies recent EtOH use  2)HTN- stable, continue amlodipine 5 mg daily,  may use IV Hydralazine 10 mg  Every 4 hours Prn for systolic blood pressure over 160 mmhg   3)FEN- hydrate aggressively IV until oral intake is more reliable monitor electrolytes  Code Status : Full    Disposition Plan  : home  Consults  :  Dietitian   DVT Prophylaxis  :  Lovenox   Lab Results  Component Value Date   PLT 290 03/19/2017    Inpatient Medications  Scheduled Meds: . amLODipine  5 mg Oral Daily  . enoxaparin (LOVENOX) injection  40 mg Subcutaneous  Q24H  . feeding supplement  1 Container Oral TID BM   Continuous Infusions: . 0.9 % NaCl with KCl 20 mEq / L 125 mL/hr at 03/20/17 1219   PRN Meds:.HYDROmorphone (DILAUDID) injection, ondansetron **OR** ondansetron (ZOFRAN) IV, traZODone    Anti-infectives    None        Objective:   Vitals:   03/20/17 0106 03/20/17 0110 03/20/17 0530 03/20/17 1400  BP: (!) 155/102 140/80 133/82 (!) 138/100  Pulse: (!) 53 (!) 57 67 (!) 53  Resp: 18  18 18   Temp: 98.2 F (36.8 C)  97.7 F (36.5 C) 97.6 F (36.4 C)  TempSrc: Oral  Oral Axillary  SpO2: 100%  100% 100%  Weight: 61.4 kg (135 lb 4.8 oz)     Height: 6\' 3"  (1.905 m)       Wt Readings from Last 3 Encounters:  03/20/17 61.4 kg (135 lb 4.8 oz)  03/19/17 68 kg (150 lb)  03/14/17 68 kg (150 lb)     Intake/Output Summary (Last 24 hours) at 03/20/17 1535 Last data filed at 03/20/17 1445  Gross per 24 hour  Intake  2750 ml  Output              450 ml  Net             2300 ml     Physical Exam  Gen:- Awake Alert,  In no apparent distress  HEENT:- [place].AT, No sclera icterus Neck-Supple Neck,No JVD,.  Lungs-  CTAB  CV- S1, S2 normal Abd-  +ve B.Sounds, Abd Soft, left-sided abdominal tenderness without rebound or guarding, scars from previous abdominal surgeries    Extremity/Skin:- No  edema,       Data Review:   Micro Results No results found for this or any previous visit (from the past 240 hour(s)).  Radiology Reports Dg Chest 2 View  Result Date: 03/19/2017 CLINICAL DATA:  Left lateral chest wall pain and upper abdominal pain for the last 8-9 days. The patient ports being an occasional smoker. EXAM: CHEST  2 VIEW COMPARISON:  Chest x-ray of March 14, 2017 FINDINGS: The lungs are well-expanded and clear. The heart and pulmonary vascularity are normal. There is no pleural effusion, pneumothorax, or pneumomediastinum. The observed portions of the upper abdomen are normal. The bony thorax is unremarkable.  IMPRESSION: There is no acute cardiopulmonary abnormality. Electronically Signed   By: David  [place] M.D.   On: 03/19/2017 10:58   Dg Chest 2 View  Result Date: 03/14/2017 CLINICAL DATA:  Chest pain EXAM: CHEST  2 VIEW COMPARISON:  01/05/2017 FINDINGS: The heart size and mediastinal contours are within normal limits. Both lungs are clear. The visualized skeletal structures are unremarkable. IMPRESSION: No active cardiopulmonary disease. Electronically Signed   By: Signa Kell M.D.   On: 03/14/2017 13:54   Ct Abdomen Pelvis W Contrast  Result Date: 03/19/2017 CLINICAL DATA:  Upper abdominal pain, nausea. EXAM: CT ABDOMEN AND PELVIS WITH CONTRAST TECHNIQUE: Multidetector CT imaging of the abdomen and pelvis was performed using the standard protocol following bolus administration of intravenous contrast. CONTRAST:  ISOVUE-300 IOPAMIDOL (ISOVUE-300) INJECTION 61% COMPARISON:  CT scan of January 05, 2017. FINDINGS: Lower chest: No acute abnormality. Hepatobiliary: Status post cholecystectomy. Fatty infiltration of the liver is noted. Stable left hepatic pneumobilia. Pancreas: Status post surgical resection of pancreatic consistent with Whipple's procedure. Focal thickening of the pancreas at the junction of the body and tail is noted concerning for focal inflammation. Spleen: Normal in size without focal abnormality. Adrenals/Urinary Tract: Adrenal glands are unremarkable. Kidneys are normal, without renal calculi, focal lesion, or hydronephrosis. Urinary bladder is decompressed. Stomach/Bowel: Postsurgical changes is seen involving the stomach consistent with Whipple procedure. There is no abnormal bowel dilatation or definite inflammation. The appendix appears normal. Vascular/Lymphatic: Aortic atherosclerosis. No enlarged abdominal or pelvic lymph nodes. Reproductive: Prostate is unremarkable. Other: No abdominal wall hernia or abnormality. No abdominopelvic ascites. Musculoskeletal: No acute or  significant osseous findings. IMPRESSION: Status post Whipple procedure. Fatty infiltration of the liver. Stable left hepatic pneumobilia most likely due to surgery. Aortic atherosclerosis. Focal thickening of the pancreas at the junction of the body and tail is noted concerning for acute pancreatitis, although neoplasm cannot be excluded. Correlation with physical findings to laboratory data is recommended, and MRI may be performed for further evaluation as well. Electronically Signed   By: Lupita Raider, M.D.   On: 03/19/2017 12:22     CBC  Recent Labs Lab 03/14/17 1247 03/19/17 1008 03/19/17 2050  WBC 6.9 10.7* 10.3  HGB 12.1* 14.6 15.1  HCT 36.8* 43.2 44.1  PLT 191 264 290  MCV 86.6 87.1 87.5  MCH 28.5 29.4 30.0  MCHC 32.9 33.8 34.2  RDW 15.1 15.3 15.4  LYMPHSABS  --  1.1 1.3  MONOABS  --  1.5* 1.2*  EOSABS  --  0.0 0.0  BASOSABS  --  0.0 0.0    Chemistries   Recent Labs Lab 03/14/17 1247 03/19/17 0929 03/19/17 1008 03/19/17 2050 03/20/17 0403  NA 136 136  --  138 138  K 3.7 3.6  --  3.7 3.8  CL 103 100*  --  101 103  CO2 25 28  --  28 27  GLUCOSE 110* 114*  --  107* 102*  BUN <5* 8  --  7 6  CREATININE 0.81 0.79  --  0.84 0.71  CALCIUM 8.5* 9.2  --  9.7 8.8*  AST 25  --  17 21  --   ALT 14*  --  12* 15*  --   ALKPHOS 100  --  137* 154*  --   BILITOT 0.4  --  0.8 0.7  --    ------------------------------------------------------------------------------------------------------------------ No results for input(s): CHOL, HDL, LDLCALC, TRIG, CHOLHDL, LDLDIRECT in the last 72 hours.  Lab Results  Component Value Date   HGBA1C 5.6 05/08/2016   ------------------------------------------------------------------------------------------------------------------ No results for input(s): TSH, T4TOTAL, T3FREE, THYROIDAB in the last 72 hours.  Invalid input(s):  FREET3 ------------------------------------------------------------------------------------------------------------------ No results for input(s): VITAMINB12, FOLATE, FERRITIN, TIBC, IRON, RETICCTPCT in the last 72 hours.  Coagulation profile No results for input(s): INR, PROTIME in the last 168 hours.  No results for input(s): DDIMER in the last 72 hours.  Cardiac Enzymes No results for input(s): CKMB, TROPONINI, MYOGLOBIN in the last 168 hours.  Invalid input(s): CK ------------------------------------------------------------------------------------------------------------------ No results found for: BNP   Deondre Marinaro M.D on 03/20/2017 at 3:35 PM  Between 7am to 7pm - Pager - [phone]  After 7pm go to [email or web address] - password [email or web address]  [facility] -  Office  [phone]   Voice Recognition Reubin Milan dictation system was used to create this note, attempts have been made to correct errors. Please contact the author with questions and/or clarifications.

## 2017-03-21 DIAGNOSIS — K861 Other chronic pancreatitis: Secondary | ICD-10-CM

## 2017-03-21 DIAGNOSIS — E440 Moderate protein-calorie malnutrition: Secondary | ICD-10-CM

## 2017-03-21 DIAGNOSIS — K859 Acute pancreatitis without necrosis or infection, unspecified: Principal | ICD-10-CM

## 2017-03-21 DIAGNOSIS — I10 Essential (primary) hypertension: Secondary | ICD-10-CM

## 2017-03-21 DIAGNOSIS — K8681 Exocrine pancreatic insufficiency: Secondary | ICD-10-CM

## 2017-03-21 MED ORDER — PANCRELIPASE (LIP-PROT-AMYL) 12000-38000 UNITS PO CPEP
24000.0000 [IU] | ORAL_CAPSULE | Freq: Three times a day (TID) | ORAL | Status: DC
  Administered 2017-03-22 (×2): 24000 [IU] via ORAL
  Filled 2017-03-21: qty 2

## 2017-03-21 MED ORDER — PROMETHAZINE HCL 25 MG PO TABS
25.0000 mg | ORAL_TABLET | Freq: Three times a day (TID) | ORAL | Status: DC | PRN
  Administered 2017-03-21: 25 mg via ORAL
  Filled 2017-03-21 (×2): qty 1

## 2017-03-21 MED ORDER — HYDROMORPHONE HCL 1 MG/ML IJ SOLN
1.0000 mg | INTRAMUSCULAR | Status: DC | PRN
  Administered 2017-03-21 – 2017-03-22 (×3): 2 mg via INTRAVENOUS
  Filled 2017-03-21 (×3): qty 2

## 2017-03-21 NOTE — Progress Notes (Signed)
PROGRESS NOTE  Brent Rogers  [id number]:[id number] DOB: [birth year] DOA: 03/19/2017 PCP: Boykin Nearing, MD   Brief Narrative: Brent Rogers is a 51 y.o. 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 Lipase was normal, though CT showed inflammatory pancreatic changes. The patient was made NPO and given IV fluids with improvement. Diet is being advanced slowly.   Assessment & Plan: Active Problems:   Exocrine pancreatic insufficiency (HCC)   Acute on chronic pancreatitis (HCC)   HTN (hypertension)   Volume depletion   Malnutrition of moderate degree  Acute on chronic/recurrent pancreatitis: Abdominal pain managed well, and pt desires advancing diet today. - Liquid > soft diet today. If tolerates and can maintain hydration po, plan to DC 6/24. - Continue creon, antiemetic - Continue IVF's  HTN: Chronic, stable - Continue amlodipine, prn hydralazine  Code Status: Full Family Communication: None at bedside Disposition Plan: DC home when improved, 24-48 hrs.  Consultants:   None  Procedures:   None  Antimicrobials:  None   Subjective: Abdominal pain/epigastric L > R severe, improved with analgesics. Overall improving despite challenges with liquid diet, desires advancing. Usual attacks last about 2 - 3 days. No dyspnea, chest pain.   Objective: Vitals:   03/20/17 1400 03/20/17 2137 03/21/17 0615 03/21/17 1440  BP: (!) 138/100 (!) 127/99 (!) 149/99 (!) 145/89  Pulse: (!) 53 73 (!) 56 (!) 55  Resp: 18 20 20 18   Temp: 97.6 F (36.4 C) 97.1 F (36.2 C) 97.6 F (36.4 C) 97.8 F (36.6 C)  TempSrc: Axillary Oral Oral Oral  SpO2: 100% 100% 100% 100%  Weight:   63.8 kg (140 lb 11.2 oz)   Height:        Intake/Output Summary (Last 24 hours) at 03/21/17 1747 Last data filed at 03/21/17 1721  Gross per 24 hour  Intake          5159.16 ml  Output              4800 ml  Net           359.16 ml   Filed Weights   03/19/17 1814 03/20/17 0106 03/21/17 0615  Weight: 68 kg (150 lb) 61.4 kg (135 lb 4.8 oz) 63.8 kg (140 lb 11.2 oz)    Examination: General exam: 51 y.o. male in no distress Respiratory system: Non-labored breathing room air. Clear to auscultation bilaterally.  Cardiovascular system: Regular rate and rhythm. No murmur, rub, or gallop. No JVD, and no pedal edema. Gastrointestinal system: Abdomen soft, epigastric tenderness without rebound, non-distended, with normoactive bowel sounds. No organomegaly or masses felt. Central nervous system: Alert and oriented. No focal neurological deficits. Extremities: Warm, no deformities Skin: Midline abdominal scar. No other rashes.  Psychiatry: Judgement and insight appear normal. Mood & affect appropriate.   Data Reviewed: I have personally reviewed following labs and imaging studies  CBC:  Recent Labs Lab 03/19/17 1008 03/19/17 2050  WBC 10.7* 10.3  NEUTROABS 8.1* 7.8*  HGB 14.6 15.1  HCT 43.2 44.1  MCV 87.1 87.5  PLT 264 834   Basic Metabolic Panel:  Recent Labs Lab 03/19/17 0929 03/19/17 2050 03/20/17 0403  NA 136 138 138  K 3.6 3.7 3.8  CL 100* 101 103  CO2 28 28 27   GLUCOSE 114* 107* 102*  BUN 8 7 6   CREATININE 0.79 0.84 0.71  CALCIUM 9.2 9.7 8.8*   GFR:  Estimated Creatinine Clearance: 98.6 mL/min (by C-G formula based on SCr of 0.71 mg/dL). Liver Function Tests:  Recent Labs Lab 03/19/17 1008 03/19/17 2050  AST 17 21  ALT 12* 15*  ALKPHOS 137* 154*  BILITOT 0.8 0.7  PROT 6.8 7.9  ALBUMIN 3.4* 4.0    Recent Labs Lab 03/19/17 1008 03/19/17 2050  LIPASE 23 23   No results for input(s): AMMONIA in the last 168 hours. Coagulation Profile: No results for input(s): INR, PROTIME in the last 168 hours. Cardiac Enzymes: No results for input(s): CKTOTAL, CKMB, CKMBINDEX, TROPONINI in the last 168 hours. BNP (last 3 results) No results for input(s):  PROBNP in the last 8760 hours. HbA1C: No results for input(s): HGBA1C in the last 72 hours. CBG: No results for input(s): GLUCAP in the last 168 hours. Lipid Profile: No results for input(s): CHOL, HDL, LDLCALC, TRIG, CHOLHDL, LDLDIRECT in the last 72 hours. Thyroid Function Tests: No results for input(s): TSH, T4TOTAL, FREET4, T3FREE, THYROIDAB in the last 72 hours. Anemia Panel: No results for input(s): VITAMINB12, FOLATE, FERRITIN, TIBC, IRON, RETICCTPCT in the last 72 hours. Urine analysis:    Component Value Date/Time   COLORURINE STRAW (A) 03/14/2017 1353   APPEARANCEUR CLEAR 03/14/2017 1353   LABSPEC 1.003 (L) 03/14/2017 1353   PHURINE 5.0 03/14/2017 1353   GLUCOSEU NEGATIVE 03/14/2017 1353   HGBUR NEGATIVE 03/14/2017 1353   BILIRUBINUR NEGATIVE 03/14/2017 1353   KETONESUR NEGATIVE 03/14/2017 1353   PROTEINUR NEGATIVE 03/14/2017 1353   UROBILINOGEN 1.0 06/23/2015 1513   NITRITE NEGATIVE 03/14/2017 1353   LEUKOCYTESUR NEGATIVE 03/14/2017 1353   Radiology Studies: No results found.  Scheduled Meds: . amLODipine  5 mg Oral Daily  . enoxaparin (LOVENOX) injection  40 mg Subcutaneous Q24H  . feeding supplement  1 Container Oral TID BM   Continuous Infusions: . 0.9 % NaCl with KCl 20 mEq / L 125 mL/hr at 03/21/17 1236     LOS: 1 day   Time spent: 25 minutes.  Vance Gather, MD [facility] Pager [phone]  If 7PM-7AM, please contact night-coverage [email or web address] Password [facility] 03/21/2017, 5:47 PM

## 2017-03-22 MED ORDER — BISMUTH SUBSALICYLATE 262 MG/15ML PO SUSP
30.0000 mL | ORAL | Status: DC | PRN
  Administered 2017-03-22 (×2): 30 mL via ORAL
  Filled 2017-03-22: qty 236

## 2017-03-22 MED ORDER — PANCRELIPASE (LIP-PROT-AMYL) 12000-38000 UNITS PO CPEP
36000.0000 [IU] | ORAL_CAPSULE | Freq: Three times a day (TID) | ORAL | Status: DC
  Administered 2017-03-22 – 2017-03-23 (×3): 36000 [IU] via ORAL
  Filled 2017-03-22 (×3): qty 3

## 2017-03-22 MED ORDER — OXYCODONE HCL 5 MG PO TABS
5.0000 mg | ORAL_TABLET | ORAL | Status: DC | PRN
  Administered 2017-03-22 (×2): 10 mg via ORAL
  Filled 2017-03-22 (×2): qty 2

## 2017-03-22 NOTE — Progress Notes (Signed)
PROGRESS NOTE  Brent Rogers  XAJ:[id number] DOB: 03-23-1966 DOA: 03/19/2017 PCP: Boykin Nearing, MD   Brief Narrative: Brent Rogers is a 51 y.o. 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 Lipase was normal, though CT showed inflammatory pancreatic changes. The patient was made NPO and given IV fluids with improvement. Diet is being advanced slowly. IV narcotics are being transitioned to PO. If tolerating a diet and pain managed on PO medications, will DC 6/25.   Assessment & Plan: Active Problems:   Exocrine pancreatic insufficiency (HCC)   Acute on chronic pancreatitis (HCC)   HTN (hypertension)   Volume depletion   Malnutrition of moderate degree  Acute on chronic/recurrent pancreatitis: Abdominal pain managed well, and pt desires advancing diet today. - Tolerating soft diet with abdominal pain requiring narcotic pain medications; though without nausea or vomiting.  - Change IV > PO pain medications today.  - Continue antiemetic, increase creon dose and add pepto-bismol as pt has had improvement with this in the past.  - Continue IVF's  HTN: Chronic, stable - Continue amlodipine, prn hydralazine  Code Status: Full Family Communication: None at bedside Disposition Plan: DC home when improved, likely ~24 hrs.  Consultants:   None  Procedures:   None  Antimicrobials:  None   Subjective: Abdominal pain is stable, improved with pain medications, but worsened with advancement of diet. Pt states this is manageable, however, and wishes to continue with this diet. Usual attacks last about 2 - 3 days. No dyspnea, chest pain, N/V. +BMs.   Objective: Vitals:   03/21/17 0615 03/21/17 1440 03/21/17 2109 03/22/17 0556  BP: (!) 149/99 (!) 145/89 (!) 151/95 (!) 141/103  Pulse: (!) 56 (!) 55 67 71  Resp: 20 18 18 17   Temp: 97.6 F (36.4 C) 97.8 F (36.6  C) 98.6 F (37 C) 98.5 F (36.9 C)  TempSrc: Oral Oral Oral Oral  SpO2: 100% 100% 100% 100%  Weight: 63.8 kg (140 lb 11.2 oz)   65.2 kg (143 lb 11.2 oz)  Height:        Intake/Output Summary (Last 24 hours) at 03/22/17 1205 Last data filed at 03/22/17 0947  Gross per 24 hour  Intake          2048.33 ml  Output             3500 ml  Net         -1451.67 ml   Filed Weights   03/20/17 0106 03/21/17 0615 03/22/17 0556  Weight: 61.4 kg (135 lb 4.8 oz) 63.8 kg (140 lb 11.2 oz) 65.2 kg (143 lb 11.2 oz)    Examination: General exam: 51 y.o. male in no distress, laying quietly in bed. Respiratory system: Non-labored breathing room air. Clear to auscultation bilaterally.  Cardiovascular system: Regular rate and rhythm. No murmur, rub, or gallop. No JVD, and no pedal edema. Gastrointestinal system: Abdomen soft with L > R upper abdominal tenderness without rebound, non-distended, with normoactive bowel sounds. No organomegaly or masses felt. Central nervous system: Alert and oriented. No focal neurological deficits. Extremities: Warm, no deformities Skin: Midline abdominal scar. No other rashes.  Psychiatry: Judgement and insight appear normal. Mood & affect appropriate.   Data Reviewed: I have personally reviewed following labs and imaging studies  CBC:  Recent Labs Lab 03/19/17 1008 03/19/17 2050  WBC 10.7* 10.3  NEUTROABS 8.1* 7.8*  HGB 14.6 15.1  HCT 43.2 44.1  MCV 87.1 87.5  PLT 264 161   Basic Metabolic Panel:  Recent Labs Lab 03/19/17 0929 03/19/17 2050 03/20/17 0403  NA 136 138 138  K 3.6 3.7 3.8  CL 100* 101 103  CO2 28 28 27   GLUCOSE 114* 107* 102*  BUN 8 7 6   CREATININE 0.79 0.84 0.71  CALCIUM 9.2 9.7 8.8*   GFR: Estimated Creatinine Clearance: 100.7 mL/min (by C-G formula based on SCr of 0.71 mg/dL). Liver Function Tests:  Recent Labs Lab 03/19/17 1008 03/19/17 2050  AST 17 21  ALT 12* 15*  ALKPHOS 137* 154*  BILITOT 0.8 0.7  PROT 6.8 7.9    ALBUMIN 3.4* 4.0    Recent Labs Lab 03/19/17 1008 03/19/17 2050  LIPASE 23 23   No results for input(s): AMMONIA in the last 168 hours. Coagulation Profile: No results for input(s): INR, PROTIME in the last 168 hours. Cardiac Enzymes: No results for input(s): CKTOTAL, CKMB, CKMBINDEX, TROPONINI in the last 168 hours. BNP (last 3 results) No results for input(s): PROBNP in the last 8760 hours. HbA1C: No results for input(s): HGBA1C in the last 72 hours. CBG: No results for input(s): GLUCAP in the last 168 hours. Lipid Profile: No results for input(s): CHOL, HDL, LDLCALC, TRIG, CHOLHDL, LDLDIRECT in the last 72 hours. Thyroid Function Tests: No results for input(s): TSH, T4TOTAL, FREET4, T3FREE, THYROIDAB in the last 72 hours. Anemia Panel: No results for input(s): VITAMINB12, FOLATE, FERRITIN, TIBC, IRON, RETICCTPCT in the last 72 hours. Urine analysis:    Component Value Date/Time   COLORURINE STRAW (A) 03/14/2017 1353   APPEARANCEUR CLEAR 03/14/2017 1353   LABSPEC 1.003 (L) 03/14/2017 1353   PHURINE 5.0 03/14/2017 1353   GLUCOSEU NEGATIVE 03/14/2017 1353   HGBUR NEGATIVE 03/14/2017 1353   BILIRUBINUR NEGATIVE 03/14/2017 1353   KETONESUR NEGATIVE 03/14/2017 1353   PROTEINUR NEGATIVE 03/14/2017 1353   UROBILINOGEN 1.0 06/23/2015 1513   NITRITE NEGATIVE 03/14/2017 1353   LEUKOCYTESUR NEGATIVE 03/14/2017 1353   Radiology Studies: No results found.  Scheduled Meds: . amLODipine  5 mg Oral Daily  . enoxaparin (LOVENOX) injection  40 mg Subcutaneous Q24H  . feeding supplement  1 Container Oral TID BM  . lipase/protease/amylase  24,000 Units Oral TID WC   Continuous Infusions: . 0.9 % NaCl with KCl 20 mEq / L 125 mL/hr at 03/22/17 0326     LOS: 2 days   Time spent: 25 minutes.  Vance Gather, MD [facility] Pager [phone]  If 7PM-7AM, please contact night-coverage [email or web address] Password [facility] 03/22/2017, 12:05 PM

## 2017-03-23 MED ORDER — HYDROCODONE-ACETAMINOPHEN 5-325 MG PO TABS: 1.0000 | ORAL_TABLET | Freq: Four times a day (QID) | ORAL | 0 refills | Status: DC | PRN

## 2017-03-23 NOTE — Progress Notes (Signed)
Patient discharged to home, all discharge medications and instructions reviewed and questions answered.  Patient to be assisted to vehicle by wheelchair.  

## 2017-03-23 NOTE — Discharge Summary (Signed)
Physician Discharge Summary  Brent Rogers ZHG:[id number] DOB: 1966-02-02 DOA: 03/19/2017  PCP: Boykin Nearing, MD  Admit date: 03/19/2017 Discharge date: 03/23/2017  Admitted From: Home Disposition: Home   Recommendations for Outpatient Follow-up:  1. Follow up with PCP in 1-2 weeks  Home Health: None Equipment/Devices: None Discharge Condition: Stable CODE STATUS: Full Diet recommendation: Soft, low fat  Brief/Interim Summary: Brent Rogers is a 51 y.o. 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 Lipase was normal, though CT showed inflammatory pancreatic changes. The patient was made NPO and given IV fluids with improvement. Diet was advanced slowly. IV narcotics were transitioned to PO, and the patient has tolerated a soft diet with manageable abdominal pain and nausea without vomiting. He will be discharged with pain medication and antiemetic.  Discharge Diagnoses:  Active Problems:   Exocrine pancreatic insufficiency (HCC)   Acute on chronic pancreatitis (HCC)   HTN (hypertension)   Volume depletion   Malnutrition of moderate degree  Acute on chronic/recurrent pancreatitis: Abdominal pain managed well - Tolerating soft diet, able to maintain hydration status. - Rx norco 5/325mg  #12. Will fill prescription for phenergan as provided 6/16 by EDP.  - Continue antiemetic, creon and pepto-bismol  HTN: Chronic, stable. - Continue amlodipine, prn hydralazine  Moderate malnutrition:  - On Boost Breeze per nutrition recommendations. Will pick these up after discharge. Is intolerant to lactose-containing products.   Discharge Instructions Discharge Instructions    Discharge instructions    Complete by:  As directed    You were admitted for flare of pancreatitis which has improved enough that you are able to take food/drink by mouth. You are stable for  discharge with the following recommendations:  - You may take norco as needed for pain (prescription provided). Use caution, and do not take this along with tramadol or tylenol.  - You may also take zofran or phenergan as needed for nausea/vomiting.  - If you have worsening abdominal pain or are unable to keep food/drink down you need to seek medical attention right away.  - Otherwise, continue taking creon and peptobismol as you were and follow up with your PCP in the next 1 - 2 weeks for hospital follow up.     Allergies as of 03/23/2017   No Known Allergies     Medication List    TAKE these medications   acetaminophen 500 MG tablet Commonly known as:  TYLENOL Take 1,000 mg by mouth every 6 (six) hours as needed for moderate pain or headache.   amLODipine 5 MG tablet Commonly known as:  NORVASC Take 1 tablet (5 mg total) by mouth daily.   HYDROcodone-acetaminophen 5-325 MG tablet Commonly known as:  NORCO/VICODIN Take 1-2 tablets by mouth every 6 (six) hours as needed for moderate pain or severe pain.   ondansetron 4 MG tablet Commonly known as:  ZOFRAN Take 1 tablet (4 mg total) by mouth every 8 (eight) hours as needed for nausea or vomiting.   Pancrelipase (Lip-Prot-Amyl) 24000-76000 units Cpep Take 1 capsule (24,000 Units total) by mouth at bedtime.   pantoprazole 40 MG tablet Commonly known as:  PROTONIX Take 1 tablet (40 mg total) by mouth daily.   promethazine 25 MG tablet Commonly known as:  PHENERGAN Take 1 tablet (25 mg total) by mouth every 8 (eight) hours as needed for nausea or vomiting.   traMADol 50 MG tablet Commonly known as:  ULTRAM Take 1 tablet (50 mg total) by mouth every 8 (eight) hours as needed. What changed:  reasons to take this   traZODone 50 MG tablet Commonly known as:  DESYREL Take 0.5-1 tablets (25-50 mg total) by mouth at bedtime as needed for sleep.      Follow-up Information    Boykin Nearing, MD Follow up.   Specialty:  Family  Medicine Contact information: [address] [place] [address] [phone]          No Known Allergies  Consultations:  None  Procedures/Studies: Dg Chest 2 View  Result Date: 03/19/2017 CLINICAL DATA:  Left lateral chest wall pain and upper abdominal pain for the last 8-9 days. The patient ports being an occasional smoker. EXAM: CHEST  2 VIEW COMPARISON:  Chest x-ray of March 14, 2017 FINDINGS: The lungs are well-expanded and clear. The heart and pulmonary vascularity are normal. There is no pleural effusion, pneumothorax, or pneumomediastinum. The observed portions of the upper abdomen are normal. The bony thorax is unremarkable. IMPRESSION: There is no acute cardiopulmonary abnormality. Electronically Signed   By: David  Martinique M.D.   On: 03/19/2017 10:58   Dg Chest 2 View  Result Date: 03/14/2017 CLINICAL DATA:  Chest pain EXAM: CHEST  2 VIEW COMPARISON:  01/05/2017 FINDINGS: The heart size and mediastinal contours are within normal limits. Both lungs are clear. The visualized skeletal structures are unremarkable. IMPRESSION: No active cardiopulmonary disease. Electronically Signed   By: Kerby Moors M.D.   On: 03/14/2017 13:54   Ct Abdomen Pelvis W Contrast  Result Date: 03/19/2017 CLINICAL DATA:  Upper abdominal pain, nausea. EXAM: CT ABDOMEN AND PELVIS WITH CONTRAST TECHNIQUE: Multidetector CT imaging of the abdomen and pelvis was performed using the standard protocol following bolus administration of intravenous contrast. CONTRAST:  138mL ISOVUE-300 IOPAMIDOL (ISOVUE-300) INJECTION 61% COMPARISON:  CT scan of January 05, 2017. FINDINGS: Lower chest: No acute abnormality. Hepatobiliary: Status post cholecystectomy. Fatty infiltration of the liver is noted. Stable left hepatic pneumobilia. Pancreas: Status post surgical resection of pancreatic consistent with Whipple's procedure. Focal thickening of the pancreas at the junction of the body and tail is noted concerning for focal  inflammation. Spleen: Normal in size without focal abnormality. Adrenals/Urinary Tract: Adrenal glands are unremarkable. Kidneys are normal, without renal calculi, focal lesion, or hydronephrosis. Urinary bladder is decompressed. Stomach/Bowel: Postsurgical changes is seen involving the stomach consistent with Whipple procedure. There is no abnormal bowel dilatation or definite inflammation. The appendix appears normal. Vascular/Lymphatic: Aortic atherosclerosis. No enlarged abdominal or pelvic lymph nodes. Reproductive: Prostate is unremarkable. Other: No abdominal wall hernia or abnormality. No abdominopelvic ascites. Musculoskeletal: No acute or significant osseous findings. IMPRESSION: Status post Whipple procedure. Fatty infiltration of the liver. Stable left hepatic pneumobilia most likely due to surgery. Aortic atherosclerosis. Focal thickening of the pancreas at the junction of the body and tail is noted concerning for acute pancreatitis, although neoplasm cannot be excluded. Correlation with physical findings to laboratory data is recommended, and MRI may be performed for further evaluation as well. Electronically Signed   By: Marijo Conception, M.D.   On: 03/19/2017 12:22   Subjective: Abdominal pain is controlled with pain medications, tolerating diet without emesis. Had BM this AM that was regular.   Discharge Exam: BP 122/84 (BP Location: Right Arm)   Pulse 65   Temp 98.7 F (37.1 C) (Oral)   Resp 15   Ht 6\' 3"  (1.905 m)   Wt 66.7 kg (147 lb)  SpO2 100%   BMI 18.37 kg/m   General: Pt is alert, awake, not in acute distress Cardiovascular: RRR, S1/S2 +, no rubs, no gallops Respiratory: CTA bilaterally, no wheezing, no rhonchi Abdominal: Soft, very mild tenderness to palpation on scaphoid abdomen with midline incision scar. Extremities: No edema, no cyanosis  Labs: Basic Metabolic Panel:  Recent Labs Lab 03/19/17 0929 03/19/17 2050 03/20/17 0403  NA 136 138 138  K 3.6 3.7 3.8   CL 100* 101 103  CO2 28 28 27   GLUCOSE 114* 107* 102*  BUN 8 7 6   CREATININE 0.79 0.84 0.71  CALCIUM 9.2 9.7 8.8*   Liver Function Tests:  Recent Labs Lab 03/19/17 1008 03/19/17 2050  AST 17 21  ALT 12* 15*  ALKPHOS 137* 154*  BILITOT 0.8 0.7  PROT 6.8 7.9  ALBUMIN 3.4* 4.0    Recent Labs Lab 03/19/17 1008 03/19/17 2050  LIPASE 23 23   CBC:  Recent Labs Lab 03/19/17 1008 03/19/17 2050  WBC 10.7* 10.3  NEUTROABS 8.1* 7.8*  HGB 14.6 15.1  HCT 43.2 44.1  MCV 87.1 87.5  PLT 264 290   Urinalysis    Component Value Date/Time   COLORURINE STRAW (A) 03/14/2017 1353   APPEARANCEUR CLEAR 03/14/2017 1353   LABSPEC 1.003 (L) 03/14/2017 1353   PHURINE 5.0 03/14/2017 1353   GLUCOSEU NEGATIVE 03/14/2017 1353   HGBUR NEGATIVE 03/14/2017 1353   BILIRUBINUR NEGATIVE 03/14/2017 1353   KETONESUR NEGATIVE 03/14/2017 1353   PROTEINUR NEGATIVE 03/14/2017 1353   UROBILINOGEN 1.0 06/23/2015 1513   NITRITE NEGATIVE 03/14/2017 1353   LEUKOCYTESUR NEGATIVE 03/14/2017 1353   Time coordinating discharge: Approximately 40 minutes  Vance Gather, MD  [facility] 03/23/2017, 11:51 AM Pager [phone]

## 2017-03-26 ENCOUNTER — Other Ambulatory Visit: Payer: Self-pay | Admitting: Family Medicine

## 2017-03-26 DIAGNOSIS — G479 Sleep disorder, unspecified: Secondary | ICD-10-CM

## 2017-03-26 MED FILL — ?ONDANSETRON HCL 4 MG TABLE: 4 | 4 days supply | Qty: 12 | Fill #0

## 2017-03-26 MED FILL — ?TRAZODONE 50 MG TABLET: 50 | 30 days supply | Qty: 30 | Fill #1

## 2017-04-30 ENCOUNTER — Other Ambulatory Visit: Payer: Self-pay | Admitting: Family Medicine

## 2017-04-30 DIAGNOSIS — K859 Acute pancreatitis without necrosis or infection, unspecified: Secondary | ICD-10-CM

## 2017-04-30 MED FILL — PROMETHAZINE 25 MG TABLET: 25 | 10 days supply | Qty: 30 | Fill #0

## 2017-05-01 ENCOUNTER — Encounter: Payer: Self-pay | Admitting: Internal Medicine

## 2017-05-01 ENCOUNTER — Ambulatory Visit: Payer: Self-pay | Attending: Internal Medicine | Admitting: Internal Medicine

## 2017-05-01 VITALS — BP 125/82 | HR 68 | Temp 98.7°F | Resp 16 | Wt 144.4 lb

## 2017-05-01 DIAGNOSIS — Z9049 Acquired absence of other specified parts of digestive tract: Secondary | ICD-10-CM | POA: Insufficient documentation

## 2017-05-01 DIAGNOSIS — K8681 Exocrine pancreatic insufficiency: Secondary | ICD-10-CM | POA: Insufficient documentation

## 2017-05-01 DIAGNOSIS — F17200 Nicotine dependence, unspecified, uncomplicated: Secondary | ICD-10-CM

## 2017-05-01 DIAGNOSIS — F17210 Nicotine dependence, cigarettes, uncomplicated: Secondary | ICD-10-CM | POA: Insufficient documentation

## 2017-05-01 DIAGNOSIS — K219 Gastro-esophageal reflux disease without esophagitis: Secondary | ICD-10-CM | POA: Insufficient documentation

## 2017-05-01 DIAGNOSIS — I10 Essential (primary) hypertension: Secondary | ICD-10-CM | POA: Insufficient documentation

## 2017-05-01 DIAGNOSIS — R634 Abnormal weight loss: Secondary | ICD-10-CM | POA: Insufficient documentation

## 2017-05-01 DIAGNOSIS — K859 Acute pancreatitis without necrosis or infection, unspecified: Secondary | ICD-10-CM

## 2017-05-01 DIAGNOSIS — Z79899 Other long term (current) drug therapy: Secondary | ICD-10-CM | POA: Insufficient documentation

## 2017-05-01 DIAGNOSIS — Z681 Body mass index (BMI) 19 or less, adult: Secondary | ICD-10-CM | POA: Insufficient documentation

## 2017-05-01 DIAGNOSIS — K861 Other chronic pancreatitis: Secondary | ICD-10-CM | POA: Insufficient documentation

## 2017-05-01 DIAGNOSIS — Z8249 Family history of ischemic heart disease and other diseases of the circulatory system: Secondary | ICD-10-CM | POA: Insufficient documentation

## 2017-05-01 DIAGNOSIS — Z1211 Encounter for screening for malignant neoplasm of colon: Secondary | ICD-10-CM

## 2017-05-01 DIAGNOSIS — K760 Fatty (change of) liver, not elsewhere classified: Secondary | ICD-10-CM | POA: Insufficient documentation

## 2017-05-01 MED ORDER — PANTOPRAZOLE SODIUM 40 MG PO TBEC: 40.0000 mg | DELAYED_RELEASE_TABLET | Freq: Every day | ORAL | 5 refills | Status: DC

## 2017-05-01 MED ORDER — TRAMADOL HCL 50 MG PO TABS: 50.0000 mg | ORAL_TABLET | Freq: Two times a day (BID) | ORAL | 0 refills | Status: DC | PRN

## 2017-05-01 MED ORDER — PROMETHAZINE HCL 25 MG PO TABS: 25.0000 mg | ORAL_TABLET | Freq: Three times a day (TID) | ORAL | 1 refills | Status: DC | PRN

## 2017-05-01 MED ORDER — PANCRELIPASE (LIP-PROT-AMYL) 24000-76000 UNITS PO CPEP: 24000.0000 [IU] | ORAL_CAPSULE | Freq: Every day | ORAL | 3 refills | Status: DC

## 2017-05-01 NOTE — Progress Notes (Signed)
Patient ID: Brent Rogers, male    DOB: 27-May-1966  MRN: [id number]  CC: re-establish   Subjective: Brent Rogers is a 51 y.o. 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    1. Chronic Panreatitis -on Crean and Protonix -occasional drinks a beer -good appetite but most days "food just runs through me" -wants to be able to gain wgh.  Wgh has stayed 150s to upper 140s over past 1 yr.  -No blood in the stools. He has never had colonoscopy for colon cancer screening. -request RF on Tramadol which he uses sparingly  2.  Tob: 1 cigarette a day. "I'm trying to give it up."  Stress contributes   3. HTN: limits salt in foods Compliant with med No chest pains or shortness of breath Patient Active Problem List   Diagnosis Date Noted  . Volume depletion 03/20/2017  . Malnutrition of moderate degree 03/20/2017  . Sleep disturbance 11/23/2016  . Foot ulcer with fat layer exposed (Brent Rogers)   . Foot ulcer, right (Brent Rogers)   . Foot callus 12/25/2015  . Onychomycosis of toenail 12/25/2015  . Fatty liver 11/12/2015  . GERD (gastroesophageal reflux disease) 11/12/2015  . HTN (hypertension) 07/10/2015  . S/P cholecystectomy 07/10/2015  . Tobacco abuse 12/26/2014  . Acute on chronic pancreatitis (Brent Rogers) 11/20/2014  . Protein-calorie malnutrition, severe (Brent Rogers) 11/20/2014  . Exocrine pancreatic insufficiency (Brent Rogers) 12/23/2013     Current Outpatient Prescriptions on File Prior to Visit  Medication Sig Dispense Refill  . acetaminophen (TYLENOL) 500 MG tablet Take 1,000 mg by mouth every 6 (six) hours as needed for moderate pain or headache.    Marland Kitchen amLODipine (NORVASC) 5 MG tablet Take 1 tablet (5 mg total) by mouth daily. 30 tablet 11  . traZODone (DESYREL) 50  MG tablet Take 0.5-1 tablets (25-50 mg total) by mouth at bedtime as needed for sleep. 30 tablet 1   No current facility-administered medications on file prior to visit.     No Known Allergies  Social History   Social History  . Marital status: Divorced    Spouse name: N/A  . Number of children: N/A  . Years of education: N/A   Occupational History  . Not on file.   Social History Main Topics  . Smoking status: Current Every Day Smoker    Packs/day: 0.25    Years: 30.00    Types: Cigarettes  . Smokeless tobacco: Former Systems developer    Types: Snuff     Comment: "used snuff 10-15 years in my 20s-30s"  . Alcohol use 2.4 oz/week    4 Cans of beer per week     Comment: 05/01/16  . Drug use: No  . Sexual activity: Not Currently    Partners: Male   Other Topics Concern  . Not on file   Social History Narrative  . No narrative on file    Family History  Problem Relation Age of Onset  . Cancer Mother        unsure  . Hypertension Father     Past Surgical History:  Procedure Laterality Date  . BACK SURGERY    . EUS N/A 09/21/2013   Procedure: ESOPHAGEAL ENDOSCOPIC ULTRASOUND (EUS) RADIAL;  Surgeon: Brent Beams, MD;  Location: [facility];  Service: Endoscopy;  Laterality: N/A;  . EUS  N/A 09/30/2013   Procedure: UPPER ENDOSCOPIC ULTRASOUND (EUS) LINEAR;  Surgeon: Brent Beams, MD;  Location: [facility];  Service: Endoscopy;  Laterality: N/A;  . FINE NEEDLE ASPIRATION N/A 09/21/2013   Procedure: FINE NEEDLE ASPIRATION (FNA) LINEAR;  Surgeon: Brent Beams, MD;  Location: [facility];  Service: Endoscopy;  Laterality: N/A;  . FINGER FRACTURE SURGERY Left 1995   5th digit  . FRACTURE SURGERY    . KNEE ARTHROSCOPY Bilateral 1987-1989   right-left  . LAPAROSCOPY N/A 12/01/2013   Procedure: LAPAROSCOPY DIAGNOSTIC PANCREATICODUODENECTOMY WITH BILIARY AND PANCREATIC STENTS;  Surgeon: Brent Klein, MD;  Location: [facility];  Service: General;  Laterality: N/A;  . LUMBAR [place]  SURGERY  1990's  . WHIPPLE PROCEDURE  12/01/2013    ROS: Review of Systems Negative except as stated above PHYSICAL EXAM: BP 125/82   Pulse 68   Temp 98.7 F (37.1 C) (Oral)   Resp 16   Wt 144 lb 6.4 oz (65.5 kg)   SpO2 99%   BMI 18.05 kg/m   Wt Readings from Last 3 Encounters:  05/01/17 144 lb 6.4 oz (65.5 kg)  03/23/17 147 lb (66.7 kg)  03/19/17 150 lb (68 kg)    Physical Exam General appearance - alert, well appearing, middle age AAM and in no distress Mental status - alert, oriented to person, place, and time, normal mood, behavior, speech, dress, motor activity, and thought processes Mouth - mucous membranes moist, pharynx normal without lesions Neck - supple, no significant adenopathy Chest - clear to auscultation, no wheezes, rales or rhonchi, symmetric air entry Heart - normal rate, regular rhythm, normal S1, S2, no murmurs, rubs, clicks or gallops Extremities - peripheral pulses normal, no pedal edema, no clubbing or cyanosis  ASSESSMENT AND PLAN: 1. Recurrent pancreatitis (Brent Rogers) - promethazine (PHENERGAN) 25 MG tablet; Take 1 tablet (25 mg total) by mouth every 8 (eight) hours as needed for nausea or vomiting.  Dispense: 20 tablet; Refill: 1 - traMADol (ULTRAM) 50 MG tablet; Take 1 tablet (50 mg total) by mouth 2 (two) times daily as needed.  Dispense: 60 tablet; Refill: 0 Pain management agreement updated.  [facility] reviewed Rxn for Norco from Dr. Bonner Rogers, one of the hospitailist was given on hosp discgh in 02/2017 for 12 pills.. Otherwise RF on Tramadols from Dr. Adrian Rogers only.  2. Exocrine pancreatic insufficiency - Pancrelipase, Lip-Prot-Amyl, 24000-76000 units CPEP; Take 1 capsule (24,000 Units total) by mouth at bedtime.  Dispense: 90 capsule; Refill: 3  3. Gastroesophageal reflux disease, esophagitis presence not specified - pantoprazole (PROTONIX) 40 MG tablet; Take 1 tablet (40 mg total) by mouth daily.  Dispense: 30 tablet; Refill: 5  4. Tobacco  dependence Patient advised to quit smoking. Discussed health risks associated with smoking including lung and other types of cancers, chronic lung diseases and CV risks.. Pt trying to quit but feels he is not able to right now due to stress   5. Colon cancer screening - Fecal occult blood, imunochemical  6. Weight loss -I suspect this is related to chronic pancreatitis especially report of diarrhea post meals.  Advised to sign up for [facility] card then we can get him in with GI.  In mean time, will get him up-to-date with colon cancer screening by having him do Cologuard tests. - TSH  Patient was given the opportunity to ask questions.  Patient verbalized understanding of the plan and was able to repeat key elements of the plan.   Orders Placed This Encounter  Procedures  .  Fecal occult blood, imunochemical  . TSH     Requested Prescriptions   Signed Prescriptions Disp Refills  . promethazine (PHENERGAN) 25 MG tablet 20 tablet 1    Sig: Take 1 tablet (25 mg total) by mouth every 8 (eight) hours as needed for nausea or vomiting.  . Pancrelipase, Lip-Prot-Amyl, 24000-76000 units CPEP 90 capsule 3    Sig: Take 1 capsule (24,000 Units total) by mouth at bedtime.  . pantoprazole (PROTONIX) 40 MG tablet 30 tablet 5    Sig: Take 1 tablet (40 mg total) by mouth daily.  . traMADol (ULTRAM) 50 MG tablet 60 tablet 0    Sig: Take 1 tablet (50 mg total) by mouth 2 (two) times daily as needed.    F/u in 3 mths Karle Plumber, MD, Rosalita Chessman

## 2017-05-01 NOTE — Patient Instructions (Signed)
Please sign up for the [facility] Card/[facility] discount.   Please return the stool card when completed.

## 2017-05-02 LAB — TSH: TSH: 1.69 u[IU]/mL (ref 0.450–4.500)

## 2017-05-04 MED FILL — traMADol HCL 50 MG TABS: 50 | 30 days supply | Qty: 60 | Fill #0

## 2017-05-10 LAB — FECAL OCCULT BLOOD, IMMUNOCHEMICAL: Fecal Occult Bld: NEGATIVE

## 2017-07-06 ENCOUNTER — Inpatient Hospital Stay (HOSPITAL_COMMUNITY)
Admission: EM | Admit: 2017-07-06 | Discharge: 2017-07-09 | DRG: 439 | Disposition: A | Discharge status: Home / Self Care | Payer: Self-pay | Attending: Family Medicine | Admitting: Family Medicine

## 2017-07-06 ENCOUNTER — Encounter (HOSPITAL_COMMUNITY): Payer: Self-pay

## 2017-07-06 DIAGNOSIS — Z90411 Acquired partial absence of pancreas: Secondary | ICD-10-CM

## 2017-07-06 DIAGNOSIS — K8689 Other specified diseases of pancreas: Secondary | ICD-10-CM | POA: Diagnosis present

## 2017-07-06 DIAGNOSIS — Z8669 Personal history of other diseases of the nervous system and sense organs: Secondary | ICD-10-CM

## 2017-07-06 DIAGNOSIS — Z681 Body mass index (BMI) 19 or less, adult: Secondary | ICD-10-CM

## 2017-07-06 DIAGNOSIS — Z8781 Personal history of (healed) traumatic fracture: Secondary | ICD-10-CM

## 2017-07-06 DIAGNOSIS — E440 Moderate protein-calorie malnutrition: Secondary | ICD-10-CM | POA: Diagnosis present

## 2017-07-06 DIAGNOSIS — F17210 Nicotine dependence, cigarettes, uncomplicated: Secondary | ICD-10-CM | POA: Diagnosis present

## 2017-07-06 DIAGNOSIS — I10 Essential (primary) hypertension: Secondary | ICD-10-CM

## 2017-07-06 DIAGNOSIS — Z809 Family history of malignant neoplasm, unspecified: Secondary | ICD-10-CM

## 2017-07-06 DIAGNOSIS — K859 Acute pancreatitis without necrosis or infection, unspecified: Principal | ICD-10-CM

## 2017-07-06 DIAGNOSIS — E876 Hypokalemia: Secondary | ICD-10-CM | POA: Diagnosis present

## 2017-07-06 DIAGNOSIS — Z86018 Personal history of other benign neoplasm: Secondary | ICD-10-CM

## 2017-07-06 DIAGNOSIS — K8681 Exocrine pancreatic insufficiency: Secondary | ICD-10-CM | POA: Diagnosis present

## 2017-07-06 DIAGNOSIS — Z23 Encounter for immunization: Secondary | ICD-10-CM

## 2017-07-06 DIAGNOSIS — G4700 Insomnia, unspecified: Secondary | ICD-10-CM | POA: Diagnosis present

## 2017-07-06 DIAGNOSIS — Z79899 Other long term (current) drug therapy: Secondary | ICD-10-CM

## 2017-07-06 DIAGNOSIS — Z9049 Acquired absence of other specified parts of digestive tract: Secondary | ICD-10-CM

## 2017-07-06 DIAGNOSIS — Z8249 Family history of ischemic heart disease and other diseases of the circulatory system: Secondary | ICD-10-CM

## 2017-07-06 DIAGNOSIS — K861 Other chronic pancreatitis: Secondary | ICD-10-CM | POA: Diagnosis present

## 2017-07-06 DIAGNOSIS — K219 Gastro-esophageal reflux disease without esophagitis: Secondary | ICD-10-CM | POA: Diagnosis present

## 2017-07-06 DIAGNOSIS — Z8739 Personal history of other diseases of the musculoskeletal system and connective tissue: Secondary | ICD-10-CM

## 2017-07-06 LAB — LIPASE, BLOOD: Lipase: 61 U/L — ABNORMAL HIGH (ref 11–51)

## 2017-07-06 LAB — COMPREHENSIVE METABOLIC PANEL
ALT: 45 U/L (ref 17–63)
AST: 54 U/L — ABNORMAL HIGH (ref 15–41)
Albumin: 4.1 g/dL (ref 3.5–5.0)
Alkaline Phosphatase: 144 U/L — ABNORMAL HIGH (ref 38–126)
Anion gap: 11 (ref 5–15)
BUN: 6 mg/dL (ref 6–20)
CO2: 27 mmol/L (ref 22–32)
Calcium: 9.5 mg/dL (ref 8.9–10.3)
Chloride: 100 mmol/L — ABNORMAL LOW (ref 101–111)
Creatinine, Ser: 1.01 mg/dL (ref 0.61–1.24)
GFR calc Af Amer: >60 mL/min (ref >60)
GFR calc non Af Amer: >60 mL/min (ref >60)
Glucose, Bld: 141 mg/dL — ABNORMAL HIGH (ref 65–99)
Potassium: 3.5 mmol/L (ref 3.5–5.1)
Sodium: 138 mmol/L (ref 135–145)
Total Bilirubin: 1.4 mg/dL — ABNORMAL HIGH (ref 0.3–1.2)
Total Protein: 7 g/dL (ref 6.5–8.1)

## 2017-07-06 LAB — CBC
HCT: 44 % (ref 39.0–52.0)
Hemoglobin: 14.7 g/dL (ref 13.0–17.0)
MCH: 28.1 pg (ref 26.0–34.0)
MCHC: 33.4 g/dL (ref 30.0–36.0)
MCV: 84 fL (ref 78.0–100.0)
Platelets: 228 10*3/uL (ref 150–400)
RBC: 5.24 MIL/uL (ref 4.22–5.81)
RDW: 15.4 % (ref 11.5–15.5)
WBC: 10.6 10*3/uL — ABNORMAL HIGH (ref 4.0–10.5)

## 2017-07-06 MED ORDER — KETOROLAC TROMETHAMINE 30 MG/ML IJ SOLN
30.0000 mg | Freq: Once | INTRAMUSCULAR | Status: AC
  Administered 2017-07-06: 30 mg via INTRAVENOUS
  Filled 2017-07-06: qty 1

## 2017-07-06 MED ORDER — GI COCKTAIL ~~LOC~~
30.0000 mL | Freq: Once | ORAL | Status: AC
  Administered 2017-07-06: 30 mL via ORAL
  Filled 2017-07-06: qty 30

## 2017-07-06 MED ORDER — DICYCLOMINE HCL 10 MG/ML IM SOLN
20.0000 mg | Freq: Once | INTRAMUSCULAR | Status: AC
  Administered 2017-07-06: 20 mg via INTRAMUSCULAR
  Filled 2017-07-06: qty 2

## 2017-07-06 MED ORDER — FAMOTIDINE IN NACL 20-0.9 MG/50ML-% IV SOLN
20.0000 mg | Freq: Once | INTRAVENOUS | Status: AC
  Administered 2017-07-06: 20 mg via INTRAVENOUS
  Filled 2017-07-06: qty 50

## 2017-07-06 MED ORDER — MORPHINE SULFATE (PF) 4 MG/ML IV SOLN
2.0000 mg | Freq: Once | INTRAVENOUS | Status: AC
Start: 2017-07-06 — End: 2017-07-06
  Administered 2017-07-06: 2 mg via INTRAVENOUS
  Filled 2017-07-06: qty 1

## 2017-07-06 MED ORDER — HYDROMORPHONE HCL 1 MG/ML IJ SOLN
1.0000 mg | Freq: Once | INTRAMUSCULAR | Status: AC
  Administered 2017-07-06: 1 mg via INTRAVENOUS
  Filled 2017-07-06: qty 1

## 2017-07-06 MED ORDER — HYDROCODONE-ACETAMINOPHEN 5-325 MG PO TABS
2.0000 | ORAL_TABLET | Freq: Once | ORAL | Status: AC
  Administered 2017-07-06: 2 via ORAL
  Filled 2017-07-06: qty 2

## 2017-07-06 MED ORDER — SODIUM CHLORIDE 0.9 % IV BOLUS (SEPSIS)
1000.0000 mL | Freq: Once | INTRAVENOUS | Status: AC
  Administered 2017-07-06: 1000 mL via INTRAVENOUS

## 2017-07-06 MED ORDER — ONDANSETRON HCL 4 MG/2ML IJ SOLN
4.0000 mg | Freq: Once | INTRAMUSCULAR | Status: AC
  Administered 2017-07-06: 4 mg via INTRAVENOUS
  Filled 2017-07-06: qty 2

## 2017-07-06 MED ORDER — SODIUM CHLORIDE 0.9 % IV BOLUS (SEPSIS)
2000.0000 mL | Freq: Once | INTRAVENOUS | Status: AC
  Administered 2017-07-06: 2000 mL via INTRAVENOUS

## 2017-07-06 NOTE — ED Provider Notes (Signed)
[facility] DEPT Provider Note   CSN: [id number] Arrival date & time: 07/06/17  1459    History   Chief Complaint Chief Complaint  Patient presents with  . Abdominal Pain    HPI Brent Rogers is a 51 y.o. male.  51 y.o. 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 Patient reports left-sided abdominal pain which is cramping in nature and has been constant over the past 3 days. He states the symptoms feel similar to prior episodes of pancreatitis. Pain radiates to the back and has been associated with nausea and vomiting. Last episode of emesis was prior to EMS arrival. Patient states that he has been taking extra strength Tylenol. He has taken tramadol for pain in the past, but has not had this medication recently. He denies any use of home antiemetics. He has been having watery diarrhea. No urinary symptoms, hematochezia. Patient followed by the [facility]. Denies ETOH use.      Past Medical History:  Diagnosis Date  . Benign tumor of endocrine pancreas   . Chronic pancreatitis ([place])    S/P Whipple  . Hypertension   . Migraine    "@ least once/month" (11/12/2015)  . Pancreatic abnormality    CT  shows mass  . Pneumonia 11/12/2015  . Scoliosis     Patient Active Problem List   Diagnosis Date Noted  . Pancreatitis 07/07/2017  . Chronic pancreatitis ([place]) 05/01/2017  . Malnutrition of moderate degree 03/20/2017  . Foot ulcer with fat layer exposed ([place])   . Foot callus 12/25/2015  . Onychomycosis of toenail 12/25/2015  . Fatty liver 11/12/2015  . GERD (gastroesophageal reflux disease) 11/12/2015  . HTN (hypertension) 07/10/2015  . S/P cholecystectomy 07/10/2015  . Tobacco abuse 12/26/2014  . Protein-calorie malnutrition, severe ([place]) 11/20/2014  . Exocrine pancreatic insufficiency ([place]) 12/23/2013    Past Surgical History:  Procedure  Laterality Date  . BACK SURGERY    . EUS N/A 09/21/2013   Procedure: ESOPHAGEAL ENDOSCOPIC ULTRASOUND (EUS) RADIAL;  Surgeon: Beryle Beams, MD;  Location: [facility];  Service: Endoscopy;  Laterality: N/A;  . EUS N/A 09/30/2013   Procedure: UPPER ENDOSCOPIC ULTRASOUND (EUS) LINEAR;  Surgeon: Beryle Beams, MD;  Location: [facility];  Service: Endoscopy;  Laterality: N/A;  . FINE NEEDLE ASPIRATION N/A 09/21/2013   Procedure: FINE NEEDLE ASPIRATION (FNA) LINEAR;  Surgeon: Beryle Beams, MD;  Location: [facility];  Service: Endoscopy;  Laterality: N/A;  . FINGER FRACTURE SURGERY Left 1995   5th digit  . FRACTURE SURGERY    . KNEE ARTHROSCOPY Bilateral 1987-1989   right-left  . LAPAROSCOPY N/A 12/01/2013   Procedure: LAPAROSCOPY DIAGNOSTIC PANCREATICODUODENECTOMY WITH BILIARY AND PANCREATIC STENTS;  Surgeon: Stark Klein, MD;  Location: [facility];  Service: General;  Laterality: N/A;  . LUMBAR [place] SURGERY  1990's  . WHIPPLE PROCEDURE  12/01/2013       Home Medications    Prior to Admission medications   Medication Sig Start Date End Date Taking? Authorizing Provider  acetaminophen (TYLENOL) 500 MG tablet Take 1,000 mg by mouth every 6 (six) hours as needed for moderate pain or headache.   Yes [provider]  amLODipine (NORVASC) 5 MG tablet Take 1 tablet (5 mg total) by mouth daily. Patient taking differently: Take 5 mg by mouth at bedtime.  11/21/16  Yes Funches, Josalyn, MD  bismuth subsalicylate (PEPTO BISMOL) 262  MG/15ML suspension Take 30 mLs by mouth every 6 (six) hours as needed for indigestion.   Yes [provider]  Pancrelipase, Lip-Prot-Amyl, 24000-76000 units CPEP Take 1 capsule (24,000 Units total) by mouth at bedtime. 05/01/17  Yes Ladell Pier, MD  pantoprazole (PROTONIX) 40 MG tablet Take 1 tablet (40 mg total) by mouth daily. 05/01/17  Yes Ladell Pier, MD  promethazine (PHENERGAN) 25 MG tablet Take 1 tablet (25 mg total) by mouth every 8  (eight) hours as needed for nausea or vomiting. 05/01/17  Yes Ladell Pier, MD  traMADol (ULTRAM) 50 MG tablet Take 1 tablet (50 mg total) by mouth 2 (two) times daily as needed. 05/01/17  Yes Ladell Pier, MD  traZODone (DESYREL) 50 MG tablet Take 0.5-1 tablets (25-50 mg total) by mouth at bedtime as needed for sleep. 11/21/16  Yes Boykin Nearing, MD    Family History Family History  Problem Relation Age of Onset  . Cancer Mother        unsure  . Hypertension Father     Social History Social History  Substance Use Topics  . Smoking status: Current Every Day Smoker    Packs/day: 0.25    Years: 30.00    Types: Cigarettes  . Smokeless tobacco: Former Systems developer    Types: Snuff     Comment: "used snuff 10-15 years in my 20s-30s"  . Alcohol use 2.4 oz/week    4 Cans of beer per week     Comment: 05/01/16     Allergies   Patient has no known allergies.   Review of Systems Review of Systems Ten systems reviewed and are negative for acute change, except as noted in the HPI.    Physical Exam Updated Vital Signs BP (!) 148/107   Pulse 63   Temp 98.1 F (36.7 C) (Oral)   Resp 16   SpO2 100%   Physical Exam  Constitutional: He is oriented to person, place, and time. He appears well-developed and well-nourished. No distress.  Nontoxic and in NAD  HENT:  Head: Normocephalic and atraumatic.  Eyes: Conjunctivae and EOM are normal. No scleral icterus.  Neck: Normal range of motion.  Cardiovascular: Normal rate, regular rhythm and intact distal pulses.   Pulmonary/Chest: Effort normal. No respiratory distress. He has no wheezes.  Respirations even and unlabored. Lungs CTAB.  Abdominal: Soft. He exhibits no distension and no mass. There is tenderness. There is no guarding.  Soft abdomen with epigastric and LUQ TTP; mild LLQ tenderness. No peritoneal signs, masses, guarding.  Musculoskeletal: Normal range of motion.  Neurological: He is alert and oriented to person, place,  and time. He exhibits normal muscle tone. Coordination normal.  GCS 15. Ambulatory with steady gait.  Skin: Skin is warm and dry. No rash noted. He is not diaphoretic. No erythema. No pallor.  Psychiatric: He has a normal mood and affect. His behavior is normal.  Nursing note and vitals reviewed.    ED Treatments / Results  Labs (all labs ordered are listed, but only abnormal results are displayed) Labs Reviewed  LIPASE, BLOOD - Abnormal; Notable for the following:       Result Value   Lipase 61 (*)    All other components within normal limits  COMPREHENSIVE METABOLIC PANEL - Abnormal; Notable for the following:    Chloride 100 (*)    Glucose, Bld 141 (*)    AST 54 (*)    Alkaline Phosphatase 144 (*)    Total Bilirubin  1.4 (*)    All other components within normal limits  CBC - Abnormal; Notable for the following:    WBC 10.6 (*)    All other components within normal limits  URINALYSIS, ROUTINE W REFLEX MICROSCOPIC - Abnormal; Notable for the following:    Color, Urine AMBER (*)    APPearance HAZY (*)    Ketones, ur 20 (*)    Protein, ur 100 (*)    All other components within normal limits    EKG  EKG Interpretation None       Radiology No results found.  Procedures Procedures (including critical care time)  Medications Ordered in ED Medications  sodium chloride 0.9 % bolus 2,000 mL (0 mLs Intravenous Stopped 07/06/17 2230)  dicyclomine (BENTYL) injection 20 mg (20 mg Intramuscular Given 07/06/17 2137)  ondansetron (ZOFRAN) injection 4 mg (4 mg Intravenous Given 07/06/17 2135)  ketorolac (TORADOL) 30 MG/ML injection 30 mg (30 mg Intravenous Given 07/06/17 2135)  morphine 4 MG/ML injection 2 mg (2 mg Intravenous Given 07/06/17 2135)  gi cocktail (Maalox,Lidocaine,Donnatal) (30 mLs Oral Given 07/06/17 2249)  HYDROmorphone (DILAUDID) injection 1 mg (1 mg Intravenous Given 07/06/17 2250)  sodium chloride 0.9 % bolus 1,000 mL (0 mLs Intravenous Stopped 07/07/17 0011)    famotidine (PEPCID) IVPB 20 mg premix (0 mg Intravenous Stopped 07/07/17 0000)  HYDROcodone-acetaminophen (NORCO/VICODIN) 5-325 MG per tablet 2 tablet (2 tablets Oral Given 07/06/17 2317)  HYDROmorphone (DILAUDID) injection 1 mg (1 mg Intravenous Given 07/07/17 0115)     Initial Impression / Assessment and Plan / ED Course  I have reviewed the triage vital signs and the nursing notes.  Pertinent labs & imaging results that were available during my care of the patient were reviewed by me and considered in my medical decision making (see chart for details).     51 year old male presents for pain consistent with past episodes of acute pancreatitis. He has a lipase of 61 today. History rarely reveals elevated lipase, though he has had multiple CTs which show pancreatic inflammation. He has history of benign pancreatic tumor. He is status post Whipple procedure.  Patient has received multiple rounds of pain medication as well as nonnarcotic IV medicines with no improvement in his symptoms. He continues to report pain rated at 9/10. He has been hydrated with 3 L of IV fluids. No vomiting visualized, though patient does not feel comfortable managing his symptoms further on an outpatient basis. Last admission was in June for similar symptoms. Case discussed with Dr. Maudie Mercury of [facility] who will admit.   Final Clinical Impressions(s) / ED Diagnoses   Final diagnoses:  Acute pancreatitis, unspecified complication status, unspecified pancreatitis type  Hypertension not at goal    New Prescriptions New Prescriptions   No medications on file     Antonietta Breach, Hershal Coria 07/07/17 0128    Dorie Rank, MD 07/08/17 [phone]

## 2017-07-06 NOTE — ED Triage Notes (Signed)
[facility]- pt coming from home complaint of left side abd pain. Pt has hx of pancreatitis states his pain feels similar. Pt reports nausea or vomiting but none with EMS. Vitals with EMS: 160/112, 100bpm, 16rr, 97% ra, CBG 181.

## 2017-07-07 ENCOUNTER — Encounter (HOSPITAL_COMMUNITY): Payer: Self-pay | Admitting: Internal Medicine

## 2017-07-07 ENCOUNTER — Observation Stay (HOSPITAL_COMMUNITY): Payer: Self-pay

## 2017-07-07 DIAGNOSIS — K8681 Exocrine pancreatic insufficiency: Secondary | ICD-10-CM

## 2017-07-07 DIAGNOSIS — K859 Acute pancreatitis without necrosis or infection, unspecified: Principal | ICD-10-CM | POA: Diagnosis present

## 2017-07-07 DIAGNOSIS — I10 Essential (primary) hypertension: Secondary | ICD-10-CM

## 2017-07-07 LAB — CBC
HCT: 39.3 % (ref 39.0–52.0)
Hemoglobin: 12.9 g/dL — ABNORMAL LOW (ref 13.0–17.0)
MCH: 27.9 pg (ref 26.0–34.0)
MCHC: 32.8 g/dL (ref 30.0–36.0)
MCV: 84.9 fL (ref 78.0–100.0)
Platelets: 200 10*3/uL (ref 150–400)
RBC: 4.63 MIL/uL (ref 4.22–5.81)
RDW: 15.6 % — ABNORMAL HIGH (ref 11.5–15.5)
WBC: 7.9 10*3/uL (ref 4.0–10.5)

## 2017-07-07 LAB — URINALYSIS, ROUTINE W REFLEX MICROSCOPIC
Bacteria, UA: NONE SEEN
Bilirubin Urine: NEGATIVE
Glucose, UA: NEGATIVE mg/dL
Hgb urine dipstick: NEGATIVE
Ketones, ur: 20 mg/dL — AB
Leukocytes, UA: NEGATIVE
Nitrite: NEGATIVE
Protein, ur: 100 mg/dL — AB
RBC / HPF: NONE SEEN RBC/hpf (ref 0–5)
Specific Gravity, Urine: 1.03 (ref 1.005–1.030)
Squamous Epithelial / LPF: NONE SEEN
pH: 5 (ref 5.0–8.0)

## 2017-07-07 LAB — COMPREHENSIVE METABOLIC PANEL
ALT: 42 U/L (ref 17–63)
AST: 57 U/L — ABNORMAL HIGH (ref 15–41)
Albumin: 3.6 g/dL (ref 3.5–5.0)
Alkaline Phosphatase: 124 U/L (ref 38–126)
Anion gap: 7 (ref 5–15)
BUN: <5 mg/dL — ABNORMAL LOW (ref 6–20)
CO2: 26 mmol/L (ref 22–32)
Calcium: 8.7 mg/dL — ABNORMAL LOW (ref 8.9–10.3)
Chloride: 102 mmol/L (ref 101–111)
Creatinine, Ser: 0.72 mg/dL (ref 0.61–1.24)
GFR calc Af Amer: >60 mL/min (ref >60)
GFR calc non Af Amer: >60 mL/min (ref >60)
Glucose, Bld: 116 mg/dL — ABNORMAL HIGH (ref 65–99)
Potassium: 3.9 mmol/L (ref 3.5–5.1)
Sodium: 135 mmol/L (ref 135–145)
Total Bilirubin: 0.8 mg/dL (ref 0.3–1.2)
Total Protein: 6.1 g/dL — ABNORMAL LOW (ref 6.5–8.1)

## 2017-07-07 LAB — HIV ANTIBODY (ROUTINE TESTING W REFLEX): HIV Screen 4th Generation wRfx: NONREACTIVE

## 2017-07-07 MED ORDER — HYDROMORPHONE HCL 1 MG/ML IJ SOLN
1.0000 mg | INTRAMUSCULAR | Status: DC | PRN
  Administered 2017-07-07 – 2017-07-09 (×8): 1 mg via INTRAVENOUS
  Filled 2017-07-07 (×8): qty 1

## 2017-07-07 MED ORDER — HYDROMORPHONE HCL 1 MG/ML IJ SOLN
1.0000 mg | Freq: Once | INTRAMUSCULAR | Status: AC
  Administered 2017-07-07: 1 mg via INTRAVENOUS
  Filled 2017-07-07: qty 1

## 2017-07-07 MED ORDER — DIPHENHYDRAMINE HCL 25 MG PO CAPS: 25.0000 mg | ORAL_CAPSULE | Freq: Four times a day (QID) | ORAL | Status: DC | PRN

## 2017-07-07 MED ORDER — ACETAMINOPHEN 325 MG PO TABS
650.0000 mg | ORAL_TABLET | Freq: Four times a day (QID) | ORAL | Status: DC | PRN
  Administered 2017-07-08: 650 mg via ORAL
  Filled 2017-07-07: qty 2

## 2017-07-07 MED ORDER — TRAZODONE HCL 50 MG PO TABS
25.0000 mg | ORAL_TABLET | Freq: Every evening | ORAL | Status: DC | PRN
  Administered 2017-07-09: 50 mg via ORAL
  Filled 2017-07-07: qty 1

## 2017-07-07 MED ORDER — PROMETHAZINE HCL 25 MG PO TABS
25.0000 mg | ORAL_TABLET | Freq: Three times a day (TID) | ORAL | Status: DC | PRN
  Administered 2017-07-08: 25 mg via ORAL
  Filled 2017-07-07: qty 1

## 2017-07-07 MED ORDER — ENOXAPARIN SODIUM 40 MG/0.4ML ~~LOC~~ SOLN
40.0000 mg | SUBCUTANEOUS | Status: DC
  Administered 2017-07-07 – 2017-07-08 (×2): 40 mg via SUBCUTANEOUS
  Filled 2017-07-07 (×2): qty 0.4

## 2017-07-07 MED ORDER — ACETAMINOPHEN 650 MG RE SUPP: 650.0000 mg | Freq: Four times a day (QID) | RECTAL | Status: DC | PRN

## 2017-07-07 MED ORDER — SODIUM CHLORIDE 0.9 % IV SOLN
INTRAVENOUS | Status: DC
  Administered 2017-07-07 (×4): via INTRAVENOUS

## 2017-07-07 MED ORDER — AMLODIPINE BESYLATE 5 MG PO TABS
5.0000 mg | ORAL_TABLET | Freq: Every day | ORAL | Status: DC
  Administered 2017-07-07 – 2017-07-08 (×2): 5 mg via ORAL
  Filled 2017-07-07 (×2): qty 1

## 2017-07-07 MED ORDER — HYDRALAZINE HCL 20 MG/ML IJ SOLN
10.0000 mg | Freq: Four times a day (QID) | INTRAMUSCULAR | Status: DC | PRN
  Administered 2017-07-07: 10 mg via INTRAVENOUS
  Filled 2017-07-07: qty 1

## 2017-07-07 MED ORDER — INFLUENZA VAC SPLIT QUAD 0.5 ML IM SUSY
0.5000 mL | PREFILLED_SYRINGE | INTRAMUSCULAR | Status: AC
  Administered 2017-07-09: 0.5 mL via INTRAMUSCULAR
  Filled 2017-07-07: qty 0.5

## 2017-07-07 MED ORDER — ONDANSETRON HCL 4 MG/2ML IJ SOLN
4.0000 mg | Freq: Four times a day (QID) | INTRAMUSCULAR | Status: DC | PRN
  Administered 2017-07-07 – 2017-07-08 (×3): 4 mg via INTRAVENOUS
  Filled 2017-07-07 (×3): qty 2

## 2017-07-07 MED ORDER — IOPAMIDOL (ISOVUE-300) INJECTION 61%
INTRAVENOUS | Status: AC
  Administered 2017-07-07: 100 mL
  Filled 2017-07-07: qty 100

## 2017-07-07 MED ORDER — PANTOPRAZOLE SODIUM 40 MG PO TBEC
40.0000 mg | DELAYED_RELEASE_TABLET | Freq: Every day | ORAL | Status: DC
Start: 2017-07-07 — End: 2017-07-09
  Administered 2017-07-07 – 2017-07-09 (×3): 40 mg via ORAL
  Filled 2017-07-07 (×3): qty 1

## 2017-07-07 NOTE — Progress Notes (Signed)
Patient seen and examined and agree with plan as well as medical decision making as per Dr. Maudie Mercury  51 year old male Known prior history of benign pancreatic tumor status post excision + Whipple--done by Dr. Barry Dienes 2015--pathology was not malignant but patient was told that this would recur periodically  Chronic pancreatitis Moderate malnutrition HTN  Admitted with Pancreatitis  EOMI, NCAT Chest clinically clear Abdomen soft no rebound lower midline scar No Lower extremity edema Neurologically is intact   urrently feeling better no chest pain, thinks that he can eat  P Advance diet and reassess  in a.m. Will cc Dr. Barry Dienes as an Otho Darner, MD [facility] [phone]

## 2017-07-07 NOTE — H&P (Signed)
TRH H&P   Patient Demographics:    Brent Rogers, is a 51 y.o. male  MRN: [id number]   DOB - 1966-07-04  Admit Date - 07/06/2017  Outpatient Primary MD for the patient is Ladell Pier, MD  Referring MD/NP/PA:  Antonietta Breach  Outpatient Specialists:  No Gastroenterologist  Patient coming from: home  Chief Complaint  Patient presents with  . Abdominal Pain      HPI:    Brent Rogers  is a 51 y.o. 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  + n/v, no blood.  Slight loose stool .  This is chronic.  Pt denies brbpr, black stool.   In ED,  Lipase 61,  Wbc 10.6, Hgb 14.7, Plt 228 CT scan pending /ordered Pt will be admitted for w/up of pancreatitis.     Review of systems:    In addition to the HPI above,  No Fever-chills, No Headache, No changes with Vision or hearing, No problems swallowing food or Liquids, No Chest pain, Cough or Shortness of Breath, No Blood in stool or Urine, No dysuria, No new skin rashes or bruises, No new joints pains-aches,  No new weakness, tingling, numbness in any extremity, No recent weight gain or loss, No polyuria, polydypsia or polyphagia, No significant Mental Stressors.  A full 10 point Review of Systems was done, except as stated above, all other Review of Systems were negative.   With Past History of the following :    Past Medical History:  Diagnosis Date  . Benign tumor of endocrine pancreas   . Chronic pancreatitis ([employer])    S/P Whipple  . Hypertension   . Migraine    "@ least once/month" (11/12/2015)  . Pancreatic abnormality    CT  shows mass  . Pneumonia 11/12/2015  . Scoliosis       Past Surgical History:  Procedure Laterality Date  . BACK SURGERY      . EUS N/A 09/21/2013   Procedure: ESOPHAGEAL ENDOSCOPIC ULTRASOUND (EUS) RADIAL;  Surgeon: Beryle Beams, MD;  Location: [facility];  Service: Endoscopy;  Laterality: N/A;  . EUS N/A 09/30/2013   Procedure: UPPER ENDOSCOPIC ULTRASOUND (EUS) LINEAR;  Surgeon: Beryle Beams, MD;  Location: [facility];  Service: Endoscopy;  Laterality: N/A;  . FINE NEEDLE ASPIRATION N/A 09/21/2013   Procedure: FINE NEEDLE ASPIRATION (FNA) LINEAR;  Surgeon: Beryle Beams, MD;  Location: [facility];  Service: Endoscopy;  Laterality: N/A;  . FINGER FRACTURE SURGERY Left 1995   5th digit  . FRACTURE SURGERY    . KNEE ARTHROSCOPY Bilateral 1987-1989   right-left  . LAPAROSCOPY N/A 12/01/2013   Procedure: LAPAROSCOPY DIAGNOSTIC PANCREATICODUODENECTOMY WITH BILIARY AND PANCREATIC STENTS;  Surgeon: Stark Klein, MD;  Location: [facility];  Service:  General;  Laterality: N/A;  . LUMBAR DISC SURGERY  1990's  . WHIPPLE PROCEDURE  12/01/2013      Social History:     Social History  Substance Use Topics  . Smoking status: Current Every Day Smoker    Packs/day: 0.25    Years: 30.00    Types: Cigarettes  . Smokeless tobacco: Former Systems developer    Types: Snuff     Comment: "used snuff 10-15 years in my 20s-30s"  . Alcohol use 2.4 oz/week    4 Cans of beer per week     Comment: 05/01/16     Lives - at home  Mobility -  Walks by self  Family History :     Family History  Problem Relation Age of Onset  . Cancer Mother        unsure  . Hypertension Father       Home Medications:   Prior to Admission medications   Medication Sig Start Date End Date Taking? Authorizing Provider  acetaminophen (TYLENOL) 500 MG tablet Take 1,000 mg by mouth every 6 (six) hours as needed for moderate pain or headache.   Yes [provider]  amLODipine (NORVASC) 5 MG tablet Take 1 tablet (5 mg total) by mouth daily. Patient taking differently: Take 5 mg by mouth at bedtime.  11/21/16  Yes Funches, Josalyn, MD  bismuth  subsalicylate (PEPTO BISMOL) 262 MG/15ML suspension Take 30 mLs by mouth every 6 (six) hours as needed for indigestion.   Yes [provider]  Pancrelipase, Lip-Prot-Amyl, 24000-76000 units CPEP Take 1 capsule (24,000 Units total) by mouth at bedtime. 05/01/17  Yes Ladell Pier, MD  pantoprazole (PROTONIX) 40 MG tablet Take 1 tablet (40 mg total) by mouth daily. 05/01/17  Yes Ladell Pier, MD  promethazine (PHENERGAN) 25 MG tablet Take 1 tablet (25 mg total) by mouth every 8 (eight) hours as needed for nausea or vomiting. 05/01/17  Yes Ladell Pier, MD  traMADol (ULTRAM) 50 MG tablet Take 1 tablet (50 mg total) by mouth 2 (two) times daily as needed. 05/01/17  Yes Ladell Pier, MD  traZODone (DESYREL) 50 MG tablet Take 0.5-1 tablets (25-50 mg total) by mouth at bedtime as needed for sleep. 11/21/16  Yes Funches, Adriana Mccallum, MD     Allergies:    No Known Allergies   Physical Exam:   Vitals  Blood pressure (!) 148/107, pulse 63, temperature 98.1 F (36.7 C), temperature source Oral, resp. rate 16, SpO2 100 %.   1. General  lying in bed in NAD,    2. Normal affect and insight, Not Suicidal or Homicidal, Awake Alert, Oriented X 3.  3. No F.N deficits, ALL C.Nerves Intact, Strength 5/5 all 4 extremities, Sensation intact all 4 extremities, Plantars down going.  4. Ears and Eyes appear Normal, Conjunctivae clear, PERRLA. Moist Oral Mucosa.  5. Supple Neck, No JVD, No cervical lymphadenopathy appriciated, No Carotid Bruits.  6. Symmetrical Chest wall movement, Good air movement bilaterally, CTAB.  7. RRR, No Gallops, Rubs or Murmurs, No Parasternal Heave.  8. Positive Bowel Sounds, Abdomen Soft, No tenderness, No organomegaly appriciated,No rebound -guarding or rigidity.  9.  No Cyanosis, Normal Skin Turgor, No Skin Rash or Bruise.  10. Good muscle tone,  joints appear normal , no effusions, Normal ROM.  11. No Palpable Lymph Nodes in Neck or Axillae     Data  Review:    CBC  Recent Labs Lab 07/06/17 1506  WBC 10.6*  HGB  14.7  HCT 44.0  PLT 228  MCV 84.0  MCH 28.1  MCHC 33.4  RDW 15.4   ------------------------------------------------------------------------------------------------------------------  Chemistries   Recent Labs Lab 07/06/17 1506  NA 138  K 3.5  CL 100*  CO2 27  GLUCOSE 141*  BUN 6  CREATININE 1.01  CALCIUM 9.5  AST 54*  ALT 45  ALKPHOS 144*  BILITOT 1.4*   ------------------------------------------------------------------------------------------------------------------ CrCl cannot be calculated (Unknown ideal weight.). ------------------------------------------------------------------------------------------------------------------ No results for input(s): TSH, T4TOTAL, T3FREE, THYROIDAB in the last 72 hours.  Invalid input(s): FREET3  Coagulation profile No results for input(s): INR, PROTIME in the last 168 hours. ------------------------------------------------------------------------------------------------------------------- No results for input(s): DDIMER in the last 72 hours. -------------------------------------------------------------------------------------------------------------------  Cardiac Enzymes No results for input(s): CKMB, TROPONINI, MYOGLOBIN in the last 168 hours.  Invalid input(s): CK ------------------------------------------------------------------------------------------------------------------ No results found for: BNP   ---------------------------------------------------------------------------------------------------------------  Urinalysis    Component Value Date/Time   COLORURINE AMBER (A) 07/06/2017 2348   APPEARANCEUR HAZY (A) 07/06/2017 2348   LABSPEC 1.030 07/06/2017 2348   PHURINE 5.0 07/06/2017 2348   GLUCOSEU NEGATIVE 07/06/2017 2348   HGBUR NEGATIVE 07/06/2017 2348   BILIRUBINUR NEGATIVE 07/06/2017 2348   KETONESUR 20 (A) 07/06/2017 2348   PROTEINUR  100 (A) 07/06/2017 2348   UROBILINOGEN 1.0 06/23/2015 1513   NITRITE NEGATIVE 07/06/2017 2348   LEUKOCYTESUR NEGATIVE 07/06/2017 2348    ----------------------------------------------------------------------------------------------------------------   Imaging Results:    No results found.    Assessment & Plan:    Principal Problem:   Pancreatitis Active Problems:   Exocrine pancreatic insufficiency (HCC)   HTN (hypertension)   GERD (gastroesophageal reflux disease)    Pancreatitis NPO Ns iv Check cbc, cmp,  lipase in am  Pancreatic insufficiency Since NPO, hold Creon Can resume when starts eating  Hypertension Hydralazine 10mg  iv q6h prn   Gerd Cont PPI   DVT Prophylaxis  Lovenox - SCDs   AM Labs Ordered, also please review Full Orders  Family Communication: Admission, patients condition and plan of care including tests being ordered have been discussed with the patient  who indicate understanding and agree with the plan and Code Status.  Code Status FULL CODE  Likely DC to  home  Condition GUARDED    Consults called: none  Admission status: observation  Time spent in minutes : 45   Jani Gravel M.D on 07/07/2017 at 1:28 AM  Between 7am to 7pm - Pager - [phone]. After 7pm go to [email or web address] - password [email or web address]  [facility] - Office  [phone]

## 2017-07-07 NOTE — ED Notes (Signed)
Attempted report x1.  Name and callback number provided.   

## 2017-07-08 LAB — COMPREHENSIVE METABOLIC PANEL
ALT: 29 U/L (ref 17–63)
AST: 30 U/L (ref 15–41)
Albumin: 3.1 g/dL — ABNORMAL LOW (ref 3.5–5.0)
Alkaline Phosphatase: 100 U/L (ref 38–126)
Anion gap: 10 (ref 5–15)
BUN: <5 mg/dL — ABNORMAL LOW (ref 6–20)
CO2: 26 mmol/L (ref 22–32)
Calcium: 8.5 mg/dL — ABNORMAL LOW (ref 8.9–10.3)
Chloride: 100 mmol/L — ABNORMAL LOW (ref 101–111)
Creatinine, Ser: 0.63 mg/dL (ref 0.61–1.24)
GFR calc Af Amer: >60 mL/min (ref >60)
GFR calc non Af Amer: >60 mL/min (ref >60)
Glucose, Bld: 101 mg/dL — ABNORMAL HIGH (ref 65–99)
Potassium: 3.2 mmol/L — ABNORMAL LOW (ref 3.5–5.1)
Sodium: 136 mmol/L (ref 135–145)
Total Bilirubin: 0.6 mg/dL (ref 0.3–1.2)
Total Protein: 5.3 g/dL — ABNORMAL LOW (ref 6.5–8.1)

## 2017-07-08 LAB — LIPASE, BLOOD: Lipase: 21 U/L (ref 11–51)

## 2017-07-08 MED ORDER — POTASSIUM CHLORIDE CRYS ER 20 MEQ PO TBCR
20.0000 meq | EXTENDED_RELEASE_TABLET | Freq: Every day | ORAL | Status: DC
  Administered 2017-07-08 – 2017-07-09 (×2): 20 meq via ORAL
  Filled 2017-07-08 (×2): qty 1

## 2017-07-08 MED ORDER — TRAZODONE HCL 50 MG PO TABS
25.0000 mg | ORAL_TABLET | Freq: Every day | ORAL | Status: DC
  Administered 2017-07-08: 25 mg via ORAL
  Filled 2017-07-08: qty 1

## 2017-07-08 MED ORDER — SODIUM CHLORIDE 0.9 % IV SOLN: INTRAVENOUS | Status: AC

## 2017-07-08 MED ORDER — SODIUM CHLORIDE 0.9 % IV SOLN
INTRAVENOUS | Status: AC
  Administered 2017-07-08: 13:00:00 via INTRAVENOUS

## 2017-07-08 NOTE — Progress Notes (Signed)
PROGRESS NOTE    Brent Rogers  TDV:[id number] DOB: [birth year] DOA: 07/06/2017 PCP: Ladell Pier, MD   Specialists:  Dr Barry Dienes   Brief Narrative:   51 year old male Known prior history of benign pancreatic tumor status post excision + Whipple--done by Dr. Barry Dienes 2015--pathology was not malignant but patient was told that this would recur periodically             Chronic pancreatitis Moderate malnutrition HTN  Admitted with Pancreatitis   Assessment & Plan:   Principal Problem:   Pancreatitis Active Problems:   Exocrine pancreatic insufficiency (HCC)   HTN (hypertension)   GERD (gastroesophageal reflux disease)   Pancreatitis  Supportive care-advance diet as tol  Cut back IVF 75-->50 cc/h  Rpt Lipase normalized to 21  Expect should be ok to go home am  Whipple for benign tumour 2015  Supportive surveillance as OP  Htn  Mod controlled  Cont amlodipine 5 hs  Mod malnutrition from pancreas surgery  Lowest weight int he past was 130 post op--upto 149  Monitor and is stable  Mild hypokalemia  replace with PO kdur 20      DVT prophylaxis: none Code Status: full Family Communication: none Disposition Plan: inpatient   Consultants:    none  Procedures:   none  Antimicrobials:   none    Subjective: Awake alert vomit x 1 when ate  Drinking ok No cp No diarr No fever  Objective: Vitals:   07/07/17 0859 07/07/17 1332 07/07/17 2047 07/08/17 0504  BP: (!) 133/97 (!) 129/92 (!) 148/95 (!) 136/92  Pulse: 71 69 72 68  Resp:  16 17 18   Temp:  97.6 F (36.4 C) 97.7 F (36.5 C) 97.7 F (36.5 C)  TempSrc:  Oral Oral Oral  SpO2:  100% 100% 100%  Weight:    69.7 kg (153 lb 9.6 oz)  Height:        Intake/Output Summary (Last 24 hours) at 07/08/17 1150 Last data filed at 07/08/17 1023  Gross per 24 hour  Intake           4162.5 ml  Output             3425 ml  Net            737.5 ml   Filed Weights   07/07/17 0400 07/07/17 0428  07/08/17 0504  Weight: 65.5 kg (144 lb 6.4 oz) 67.6 kg (149 lb) 69.7 kg (153 lb 9.6 oz)    Examination:  eomi ncat cta b abd soft-midline insicion  No rebound no garud no hsm s1 s 2no m/r/g No le edema  Data Reviewed: I have personally reviewed following labs and imaging studies  CBC:  Recent Labs Lab 07/06/17 1506 07/07/17 0638  WBC 10.6* 7.9  HGB 14.7 12.9*  HCT 44.0 39.3  MCV 84.0 84.9  PLT 228 062   Basic Metabolic Panel:  Recent Labs Lab 07/06/17 1506 07/07/17 0638 07/08/17 0324  NA 138 135 136  K 3.5 3.9 3.2*  CL 100* 102 100*  CO2 27 26 26   GLUCOSE 141* 116* 101*  BUN 6 <5* <5*  CREATININE 1.01 0.72 0.63  CALCIUM 9.5 8.7* 8.5*   GFR: Estimated Creatinine Clearance: 107.7 mL/min (by C-G formula based on SCr of 0.63 mg/dL). Liver Function Tests:  Recent Labs Lab 07/06/17 1506 07/07/17 0638 07/08/17 0324  AST 54* 57* 30  ALT 45 42 29  ALKPHOS 144* 124 100  BILITOT 1.4* 0.8 0.6  PROT  7.0 6.1* 5.3*  ALBUMIN 4.1 3.6 3.1*    Recent Labs Lab 07/06/17 1506 07/08/17 0324  LIPASE 61* 21   No results for input(s): AMMONIA in the last 168 hours. Coagulation Profile: No results for input(s): INR, PROTIME in the last 168 hours. Cardiac Enzymes: No results for input(s): CKTOTAL, CKMB, CKMBINDEX, TROPONINI in the last 168 hours. BNP (last 3 results) No results for input(s): PROBNP in the last 8760 hours. HbA1C: No results for input(s): HGBA1C in the last 72 hours. CBG: No results for input(s): GLUCAP in the last 168 hours. Lipid Profile: No results for input(s): CHOL, HDL, LDLCALC, TRIG, CHOLHDL, LDLDIRECT in the last 72 hours. Thyroid Function Tests: No results for input(s): TSH, T4TOTAL, FREET4, T3FREE, THYROIDAB in the last 72 hours. Anemia Panel: No results for input(s): VITAMINB12, FOLATE, FERRITIN, TIBC, IRON, RETICCTPCT in the last 72 hours. Urine analysis:    Component Value Date/Time   COLORURINE AMBER (A) 07/06/2017 2348    APPEARANCEUR HAZY (A) 07/06/2017 2348   LABSPEC 1.030 07/06/2017 2348   PHURINE 5.0 07/06/2017 2348   GLUCOSEU NEGATIVE 07/06/2017 2348   HGBUR NEGATIVE 07/06/2017 2348   BILIRUBINUR NEGATIVE 07/06/2017 2348   KETONESUR 20 (A) 07/06/2017 2348   PROTEINUR 100 (A) 07/06/2017 2348   UROBILINOGEN 1.0 06/23/2015 1513   NITRITE NEGATIVE 07/06/2017 2348   LEUKOCYTESUR NEGATIVE 07/06/2017 2348     Radiology Studies: Reviewed images personally in health database    Scheduled Meds: . amLODipine  5 mg Oral QHS  . enoxaparin (LOVENOX) injection  40 mg Subcutaneous Q24H  . Influenza vac split quadrivalent PF  0.5 mL Intramuscular Tomorrow-1000  . pantoprazole  40 mg Oral Daily  . traZODone  25 mg Oral QHS   Continuous Infusions:   LOS: 1 day    Time spent: Geronimo, MD [facility] ([facility]   If 7PM-7AM, please contact night-coverage [email or web address] Password [email or web address] 07/08/2017, 11:50 AM

## 2017-07-09 LAB — BASIC METABOLIC PANEL
Anion gap: 8 (ref 5–15)
BUN: <5 mg/dL — ABNORMAL LOW (ref 6–20)
CO2: 29 mmol/L (ref 22–32)
Calcium: 8.8 mg/dL — ABNORMAL LOW (ref 8.9–10.3)
Chloride: 102 mmol/L (ref 101–111)
Creatinine, Ser: 0.74 mg/dL (ref 0.61–1.24)
GFR calc Af Amer: >60 mL/min (ref >60)
GFR calc non Af Amer: >60 mL/min (ref >60)
Glucose, Bld: 101 mg/dL — ABNORMAL HIGH (ref 65–99)
Potassium: 3.3 mmol/L — ABNORMAL LOW (ref 3.5–5.1)
Sodium: 139 mmol/L (ref 135–145)

## 2017-07-09 MED ORDER — TRAZODONE HCL 50 MG PO TABS: 25.0000 mg | ORAL_TABLET | Freq: Every day | ORAL | 1 refills | Status: DC

## 2017-07-09 MED FILL — AMLODIPINE BESYLATE 5 MG TA: 5 | 30 days supply | Qty: 30 | Fill #1

## 2017-07-09 MED FILL — PROMETHAZINE 25 MG TABLET: 25 | 10 days supply | Qty: 30 | Fill #1

## 2017-07-09 NOTE — Discharge Summary (Signed)
Physician Discharge Summary  Brent Rogers ACZ:[id number] DOB: 11/10/1965 DOA: 07/06/2017  PCP: Ladell Pier, MD  Admit date: 07/06/2017 Discharge date: 07/09/2017  Time spent: 20 minutes  Recommendations for Outpatient Follow-up:  1. Needs OP follow up with pcp 2. recommned refill of trazodone as prn by pcp  Discharge Diagnoses:  Principal Problem:   Pancreatitis Active Problems:   Exocrine pancreatic insufficiency (HCC)   HTN (hypertension)   GERD (gastroesophageal reflux disease)   Discharge Condition: imporved  Diet recommendation: soft  Filed Weights   07/07/17 0400 07/07/17 0428 07/08/17 0504  Weight: 65.5 kg (144 lb 6.4 oz) 67.6 kg (149 lb) 69.7 kg (153 lb 9.6 oz)    History of present illness:  51 year old male Known prior history of benign pancreatic tumor status post excision + Whipple--done by Dr. Hoyt Koch was not malignant but patient was told that this would recur periodically Chronic pancreatitis Moderate malnutrition HTN  Admitted with Pancreatitis  Hospital Course:   Pancreatitis             Supportive care-advance diet as tol             Rpt Lipase normalized to 20   Stabilized on d/c  Whipple for benign tumour 2015             Supportive surveillance as OP  Htn             Mod controlled             Cont amlodipine 5 hs  Mod malnutrition from pancreas surgery             Lowest weight int he past was 130 post op--upto 149             Monitor and is stable  Mild hypokalemia             replace with PO kdur 20  Insomnia  Given low dose trazadone + 1 refill--Needs PCP follow up for further refills    Discharge Exam: Vitals:   07/08/17 2113 07/09/17 0538  BP: (!) 133/94 (!) 130/91  Pulse: 73 72  Resp: 16 16  Temp: 99.6 F (37.6 C) 98.2 F (36.8 C)  SpO2: 100% 100%    General: awake alert eating and drinking in nad Cardiovascular: s1 s2 no m/r/g Respiratory: clear  Discharge  Instructions   Discharge Instructions    Diet - low sodium heart healthy    Complete by:  As directed    Increase activity slowly    Complete by:  As directed      Current Discharge Medication List    CONTINUE these medications which have CHANGED   Details  traZODone (DESYREL) 50 MG tablet Take 0.5 tablets (25 mg total) by mouth at bedtime. Qty: 30 tablet, Refills: 1      CONTINUE these medications which have NOT CHANGED   Details  acetaminophen (TYLENOL) 500 MG tablet Take 1,000 mg by mouth every 6 (six) hours as needed for moderate pain or headache.    amLODipine (NORVASC) 5 MG tablet Take 1 tablet (5 mg total) by mouth daily. Qty: 30 tablet, Refills: 11   Associated Diagnoses: Essential hypertension    bismuth subsalicylate (PEPTO BISMOL) 262 MG/15ML suspension Take 30 mLs by mouth every 6 (six) hours as needed for indigestion.    Pancrelipase, Lip-Prot-Amyl, 24000-76000 units CPEP Take 1 capsule (24,000 Units total) by mouth at bedtime. Qty: 90 capsule, Refills: 3   Associated Diagnoses: Exocrine pancreatic insufficiency  pantoprazole (PROTONIX) 40 MG tablet Take 1 tablet (40 mg total) by mouth daily. Qty: 30 tablet, Refills: 5   Associated Diagnoses: Gastroesophageal reflux disease, esophagitis presence not specified    traMADol (ULTRAM) 50 MG tablet Take 1 tablet (50 mg total) by mouth 2 (two) times daily as needed. Qty: 60 tablet, Refills: 0   Associated Diagnoses: Recurrent pancreatitis ([place])      STOP taking these medications     promethazine (PHENERGAN) 25 MG tablet        No Known Allergies    The results of significant diagnostics from this hospitalization (including imaging, microbiology, ancillary and laboratory) are listed below for reference.    Significant Diagnostic Studies: Ct Abdomen Pelvis W Contrast  Result Date: 07/07/2017 CLINICAL DATA:  Left-sided abdominal pain with nausea and vomiting EXAM: CT ABDOMEN AND PELVIS WITH CONTRAST  TECHNIQUE: Multidetector CT imaging of the abdomen and pelvis was performed using the standard protocol following bolus administration of intravenous contrast. CONTRAST:  159mL ISOVUE-300 IOPAMIDOL (ISOVUE-300) INJECTION 61% COMPARISON:  CT abdomen pelvis 03/19/2017 FINDINGS: Lower chest: Right basilar subsegmental atelectasis. No pleural effusion. Hepatobiliary: Unchanged pneumobilia. Mild periportal edema vs mild intrahepatic bile duct prominence. No focal liver lesions. No biliary dilatation. Gallbladder surgically absent. Pancreas: Status post Whipple procedure. Unremarkable appearance of the pancreatic body and tail. Spleen: Normal. Adrenals/Urinary Tract: --Adrenal glands: Normal. --Right kidney/ureter: No hydronephrosis or perinephric stranding. No nephrolithiasis. No obstructing ureteral stones. --Left kidney/ureter: No hydronephrosis or perinephric stranding. No nephrolithiasis. No obstructing ureteral stones. --Urinary bladder: Unremarkable. Stomach/Bowel: --Stomach/Duodenum: Status post Whipple procedure. --Small bowel: No dilatation or inflammation. --Colon: No focal abnormality. --Appendix: Not visualized. No right lower quadrant inflammation or free fluid. Vascular/Lymphatic: Atherosclerotic calcification is present within the non-aneurysmal abdominal aorta, without hemodynamically significant stenosis. No abdominal or pelvic lymphadenopathy. Reproductive: Status post hysterectomy. No adnexal mass. Musculoskeletal. Multilevel degenerative disc disease and facet arthrosis. No bony spinal canal stenosis. Other: None. IMPRESSION: 1. Status post Whipple procedure without visualized pancreatic abnormality. 2. No acute abdominopelvic abnormality. 3.  Aortic Atherosclerosis (ICD10-I70.0). Electronically Signed   By: Ulyses Jarred M.D.   On: 07/07/2017 02:43    Microbiology: No results found for this or any previous visit (from the past 240 hour(s)).   Labs: Basic Metabolic Panel:  Recent Labs Lab  07/06/17 1506 07/07/17 0638 07/08/17 0324 07/09/17 0557  NA 138 135 136 139  K 3.5 3.9 3.2* 3.3*  CL 100* 102 100* 102  CO2 27 26 26 29   GLUCOSE 141* 116* 101* 101*  BUN 6 <5* <5* <5*  CREATININE 1.01 0.72 0.63 0.74  CALCIUM 9.5 8.7* 8.5* 8.8*   Liver Function Tests:  Recent Labs Lab 07/06/17 1506 07/07/17 0638 07/08/17 0324  AST 54* 57* 30  ALT 45 42 29  ALKPHOS 144* 124 100  BILITOT 1.4* 0.8 0.6  PROT 7.0 6.1* 5.3*  ALBUMIN 4.1 3.6 3.1*    Recent Labs Lab 07/06/17 1506 07/08/17 0324  LIPASE 61* 21   No results for input(s): AMMONIA in the last 168 hours. CBC:  Recent Labs Lab 07/06/17 1506 07/07/17 0638  WBC 10.6* 7.9  HGB 14.7 12.9*  HCT 44.0 39.3  MCV 84.0 84.9  PLT 228 200   Cardiac Enzymes: No results for input(s): CKTOTAL, CKMB, CKMBINDEX, TROPONINI in the last 168 hours. BNP: BNP (last 3 results) No results for input(s): BNP in the last 8760 hours.  ProBNP (last 3 results) No results for input(s): PROBNP in the last 8760 hours.  CBG: No  results for input(s): GLUCAP in the last 168 hours.     SignedNita Sells MD   [facility] 07/09/2017, 9:24 AM

## 2017-07-09 NOTE — Progress Notes (Signed)
Brent Rogers to be D/C'd  per MD order. Discussed with the patient and all questions fully answered.  VSS, Skin clean, dry and intact without evidence of skin break down, no evidence of skin tears noted.  IV catheter discontinued intact. Site without signs and symptoms of complications. Dressing and pressure applied.  An After Visit Summary was printed and given to the patient. Patient received prescription.  D/c education completed with patient including medication list and diet instructions as indicated by MD - patient able to verbalize understanding, all questions fully answered.   Patient instructed to return to ED, call 911, or call MD for any changes in condition.   Patient asked for a bus pass but later stated he had a friend picking him up.  He left unit alone.

## 2017-07-10 ENCOUNTER — Telehealth: Payer: Self-pay | Admitting: Internal Medicine

## 2017-07-10 DIAGNOSIS — K859 Acute pancreatitis without necrosis or infection, unspecified: Secondary | ICD-10-CM

## 2017-07-10 MED ORDER — TRAMADOL HCL 50 MG PO TABS: 50.0000 mg | ORAL_TABLET | Freq: Two times a day (BID) | ORAL | 0 refills | Status: DC | PRN

## 2017-07-10 NOTE — Telephone Encounter (Signed)
Pt came into office requesting medication refill on traMADol (ULTRAM) 50 MG tablet Please f/up

## 2017-07-12 NOTE — Telephone Encounter (Signed)
Pt requested RF on Tramadol. [facility] reviewed and is appropriate.

## 2017-07-13 ENCOUNTER — Other Ambulatory Visit: Payer: Self-pay | Admitting: Family Medicine

## 2017-07-13 DIAGNOSIS — K859 Acute pancreatitis without necrosis or infection, unspecified: Secondary | ICD-10-CM

## 2017-07-13 MED ORDER — TRAMADOL HCL 50 MG PO TABS: 50.0000 mg | ORAL_TABLET | Freq: Two times a day (BID) | ORAL | 0 refills | Status: DC | PRN

## 2017-07-13 MED FILL — traZODone HCL 50 MG TABS: 50 | 30 days supply | Qty: 15 | Fill #0

## 2017-07-13 NOTE — Telephone Encounter (Signed)
Pt is aware of rx being ready for pick-up

## 2017-07-14 MED FILL — traMADol HCL 50 MG TABS: 50 | 30 days supply | Qty: 60 | Fill #0

## 2017-08-27 ENCOUNTER — Encounter: Payer: Self-pay | Admitting: Internal Medicine

## 2017-08-27 ENCOUNTER — Ambulatory Visit: Payer: Self-pay | Attending: Internal Medicine | Admitting: Internal Medicine

## 2017-08-27 DIAGNOSIS — Z9049 Acquired absence of other specified parts of digestive tract: Secondary | ICD-10-CM | POA: Insufficient documentation

## 2017-08-27 DIAGNOSIS — F17210 Nicotine dependence, cigarettes, uncomplicated: Secondary | ICD-10-CM | POA: Insufficient documentation

## 2017-08-27 DIAGNOSIS — K219 Gastro-esophageal reflux disease without esophagitis: Secondary | ICD-10-CM | POA: Insufficient documentation

## 2017-08-27 DIAGNOSIS — K861 Other chronic pancreatitis: Secondary | ICD-10-CM | POA: Insufficient documentation

## 2017-08-27 DIAGNOSIS — R109 Unspecified abdominal pain: Secondary | ICD-10-CM | POA: Insufficient documentation

## 2017-08-27 DIAGNOSIS — Z8249 Family history of ischemic heart disease and other diseases of the circulatory system: Secondary | ICD-10-CM | POA: Insufficient documentation

## 2017-08-27 DIAGNOSIS — K859 Acute pancreatitis without necrosis or infection, unspecified: Secondary | ICD-10-CM

## 2017-08-27 DIAGNOSIS — Z800 Family history of malignant neoplasm of digestive organs: Secondary | ICD-10-CM | POA: Insufficient documentation

## 2017-08-27 DIAGNOSIS — I10 Essential (primary) hypertension: Secondary | ICD-10-CM | POA: Insufficient documentation

## 2017-08-27 DIAGNOSIS — K8681 Exocrine pancreatic insufficiency: Secondary | ICD-10-CM | POA: Insufficient documentation

## 2017-08-27 DIAGNOSIS — Z79899 Other long term (current) drug therapy: Secondary | ICD-10-CM | POA: Insufficient documentation

## 2017-08-27 DIAGNOSIS — G8929 Other chronic pain: Secondary | ICD-10-CM | POA: Insufficient documentation

## 2017-08-27 MED ORDER — PANCRELIPASE (LIP-PROT-AMYL) 24000-76000 UNITS PO CPEP: ORAL_CAPSULE | ORAL | 3 refills | Status: DC

## 2017-08-27 MED ORDER — TRAMADOL HCL 50 MG PO TABS: 50.0000 mg | ORAL_TABLET | Freq: Two times a day (BID) | ORAL | 0 refills | Status: DC | PRN

## 2017-08-27 MED ORDER — AMLODIPINE BESYLATE 5 MG PO TABS: 7.5000 mg | ORAL_TABLET | Freq: Every day | ORAL | 11 refills | Status: DC

## 2017-08-27 NOTE — Progress Notes (Signed)
Patient ID: Brent Rogers, male    DOB: [birth year]  MRN: [id number]  CC: Disability Paperwork   Subjective: Brent Rogers is a 51 y.o. male who presents for chronic ds management. Last seen 04/2017 His concerns today include:  51 year old male with history of chronic pancreatitis s/p benign pancreatic tumor excision with Whipple procedure, fhx of pancreatic cancer in father,hypertension, GERD  1. Chronic Pancreatitis: Patient hospitalized last month with acute on chronic pancreatitis.  He had an uncomplicated course. -Continues on Creon 24,000 units at night.  Reports 4-5 soft bulky stools daily.  Still not able to gain weight.  Low level of abdominal pain sometimes nausea every day which she has learned to live with.  Takes tramadol sparingly. -He completed test which was negative. -He is working on trying to get disability.  He has a form from his lawyer inquires about his diagnosis of chronic pancreatitis which she would like for me to fill out. -He also has a form for him to get the Creon through the patient assistance program.  2. JQB:HALPFXTKW with Norvasc and salt restriction No chest pains or shortness of breath. No lower extremity edema.  3. GERD: compliant with med Patient Active Problem List   Diagnosis Date Noted  . Pancreatitis 07/07/2017  . Chronic pancreatitis ([place]) 05/01/2017  . Malnutrition of moderate degree 03/20/2017  . Foot ulcer with fat layer exposed ([place])   . Foot callus 12/25/2015  . Onychomycosis of toenail 12/25/2015  . Fatty liver 11/12/2015  . GERD (gastroesophageal reflux disease) 11/12/2015  . HTN (hypertension) 07/10/2015  . S/P cholecystectomy 07/10/2015  . Tobacco abuse 12/26/2014  . Protein-calorie malnutrition, severe ([place]) 11/20/2014  . Exocrine pancreatic insufficiency ([place]) 12/23/2013     Current Outpatient Medications on File Prior to Visit  Medication Sig Dispense Refill  . acetaminophen (TYLENOL) 500 MG tablet Take 1,000 mg by  mouth every 6 (six) hours as needed for moderate pain or headache.    Marland Kitchen amLODipine (NORVASC) 5 MG tablet Take 1 tablet (5 mg total) by mouth daily. (Patient taking differently: Take 5 mg by mouth at bedtime. ) 30 tablet 11  . bismuth subsalicylate (PEPTO BISMOL) 262 MG/15ML suspension Take 30 mLs by mouth every 6 (six) hours as needed for indigestion.    . Pancrelipase, Lip-Prot-Amyl, 24000-76000 units CPEP Take 1 capsule (24,000 Units total) by mouth at bedtime. 90 capsule 3  . pantoprazole (PROTONIX) 40 MG tablet Take 1 tablet (40 mg total) by mouth daily. 30 tablet 5  . traMADol (ULTRAM) 50 MG tablet Take 1 tablet (50 mg total) by mouth 2 (two) times daily as needed. 60 tablet 0  . traZODone (DESYREL) 50 MG tablet Take 0.5 tablets (25 mg total) by mouth at bedtime. 30 tablet 1   No current facility-administered medications on file prior to visit.     No Known Allergies  Social History   Socioeconomic History  . Marital status: Divorced    Spouse name: Not on file  . Number of children: Not on file  . Years of education: Not on file  . Highest education level: Not on file  Social Needs  . Financial resource strain: Not on file  . Food insecurity - worry: Not on file  . Food insecurity - inability: Not on file  . Transportation needs - medical: Not on file  . Transportation needs - non-medical: Not on file  Occupational History  . Not on file  Tobacco Use  . Smoking status: Current Every  Day Smoker    Packs/day: 0.25    Years: 30.00    Pack years: 7.50    Types: Cigarettes  . Smokeless tobacco: Former Systems developer    Types: Snuff  . Tobacco comment: "used snuff 10-15 years in my 20s-30s"  Substance and Sexual Activity  . Alcohol use: Yes    Alcohol/week: 2.4 oz    Types: 4 Cans of beer per week    Comment: 05/01/16  . Drug use: No  . Sexual activity: Not Currently    Partners: Male  Other Topics Concern  . Not on file  Social History Narrative  . Not on file    Family  History  Problem Relation Age of Onset  . Cancer Mother        unsure  . Hypertension Father     Past Surgical History:  Procedure Laterality Date  . BACK SURGERY    . EUS N/A 09/21/2013   Procedure: ESOPHAGEAL ENDOSCOPIC ULTRASOUND (EUS) RADIAL;  Surgeon: Beryle Beams, MD;  Location: [facility];  Service: Endoscopy;  Laterality: N/A;  . EUS N/A 09/30/2013   Procedure: UPPER ENDOSCOPIC ULTRASOUND (EUS) LINEAR;  Surgeon: Beryle Beams, MD;  Location: [facility];  Service: Endoscopy;  Laterality: N/A;  . FINE NEEDLE ASPIRATION N/A 09/21/2013   Procedure: FINE NEEDLE ASPIRATION (FNA) LINEAR;  Surgeon: Beryle Beams, MD;  Location: [facility];  Service: Endoscopy;  Laterality: N/A;  . FINGER FRACTURE SURGERY Left 1995   5th digit  . FRACTURE SURGERY    . KNEE ARTHROSCOPY Bilateral 1987-1989   right-left  . LAPAROSCOPY N/A 12/01/2013   Procedure: LAPAROSCOPY DIAGNOSTIC PANCREATICODUODENECTOMY WITH BILIARY AND PANCREATIC STENTS;  Surgeon: Stark Klein, MD;  Location: [facility];  Service: General;  Laterality: N/A;  . LUMBAR [place] SURGERY  1990's  . WHIPPLE PROCEDURE  12/01/2013    ROS: Review of Systems Negative except as stated above. PHYSICAL EXAM: BP (!) 144/91 (BP Location: Left Arm, Patient Position: Sitting, Cuff Size: Normal)   Pulse 80   Temp 98.5 F (36.9 C) (Oral)   Resp 18   Ht 6\' 3"  (1.905 m)   Wt 151 lb (68.5 kg)   SpO2 98%   BMI 18.87 kg/m   Wt Readings from Last 3 Encounters:  08/27/17 151 lb (68.5 kg)  07/08/17 153 lb 9.6 oz (69.7 kg)  05/01/17 144 lb 6.4 oz (65.5 kg)    Physical Exam  General appearance - alert, well appearing, and in no distress Mental status - alert, oriented to person, place, and time, normal mood, behavior, speech, dress, motor activity, and thought processes Chest - clear to auscultation, no wheezes, rales or rhonchi, symmetric air entry Heart - normal rate, regular rhythm, normal S1, S2, no murmurs, rubs, clicks or  gallops Extremities - peripheral pulses normal, no pedal edema, no clubbing or cyanosis  ASSESSMENT AND PLAN: 1. Essential hypertension -not at goal. Inc Norvasc to 7.5 mg - amLODipine (NORVASC) 5 MG tablet; Take 1.5 tablets (7.5 mg total) by mouth daily.  Dispense: 45 tablet; Refill: 11  2. Exocrine pancreatic insufficiency -given steatorrhea and chronic abd pain, we discuss increasing Creon to TID with meals. Form to be completed by our PAP coordinator in pharmacy - Pancrelipase, Lip-Prot-Amyl, 24000-76000 units CPEP; 24,000 units TID with meals  Dispense: 90 capsule; Refill: 3  3. Recurrent pancreatitis ([place]) -I will completed form for his lawyer and have my nurse call him when ready RF Tramadol which he uses sparingly NCCSRS reviewed and is appropriate.  -  traMADol (ULTRAM) 50 MG tablet; Take 1 tablet (50 mg total) by mouth 2 (two) times daily as needed.  Dispense: 60 tablet; Refill: 0  Patient was given the opportunity to ask questions.  Patient verbalized understanding of the plan and was able to repeat key elements of the plan.   No orders of the defined types were placed in this encounter.    Requested Prescriptions    No prescriptions requested or ordered in this encounter    F/u in 3 mths Karle Plumber, MD, Rosalita Chessman

## 2017-08-27 NOTE — Patient Instructions (Signed)
Increase Creon to 24,000 units three times a day with meals.

## 2017-08-28 MED FILL — traMADol HCL 50 MG TABS: 50 | 30 days supply | Qty: 60 | Fill #0

## 2017-08-31 ENCOUNTER — Telehealth: Payer: Self-pay | Admitting: Internal Medicine

## 2017-08-31 MED FILL — PROMETHAZINE 25 MG TABLET: 25 | 10 days supply | Qty: 30 | Fill #2

## 2017-08-31 NOTE — Telephone Encounter (Signed)
Pt came to the office to check on the statu of the papers he left for her

## 2017-09-01 MED FILL — AMLODIPINE BESYLATE 5 MG TA: 5 | 30 days supply | Qty: 45 | Fill #0

## 2017-09-03 NOTE — Telephone Encounter (Signed)
Patient did not answer. Left patient a voicemail the paperwork is ready for him to pick up in the front office.

## 2017-10-07 ENCOUNTER — Other Ambulatory Visit: Payer: Self-pay | Admitting: Internal Medicine

## 2017-10-07 DIAGNOSIS — K859 Acute pancreatitis without necrosis or infection, unspecified: Secondary | ICD-10-CM

## 2017-10-08 NOTE — Telephone Encounter (Signed)
Received request for RF Tramadol. NCCSRS reviewed and is appropriate.  Last rxn filled 08/28/2017.   RF given.

## 2017-10-08 NOTE — Telephone Encounter (Signed)
Contacted pt to inform him that his rx is ready for pick up

## 2017-10-19 MED FILL — PROMETHAZINE 25 MG TABLET: 25 | 6 days supply | Qty: 20 | Fill #0

## 2017-10-19 MED FILL — traZODone HCL 50 MG TABS: 50 | 30 days supply | Qty: 15 | Fill #1

## 2017-11-26 ENCOUNTER — Encounter: Payer: Self-pay | Admitting: Internal Medicine

## 2017-11-26 ENCOUNTER — Ambulatory Visit: Payer: Self-pay | Attending: Internal Medicine | Admitting: Internal Medicine

## 2017-11-26 VITALS — BP 153/95 | HR 61 | Temp 98.1°F | Resp 16 | Ht 76.0 in | Wt 151.6 lb

## 2017-11-26 DIAGNOSIS — Z800 Family history of malignant neoplasm of digestive organs: Secondary | ICD-10-CM | POA: Insufficient documentation

## 2017-11-26 DIAGNOSIS — K8681 Exocrine pancreatic insufficiency: Secondary | ICD-10-CM | POA: Insufficient documentation

## 2017-11-26 DIAGNOSIS — E43 Unspecified severe protein-calorie malnutrition: Secondary | ICD-10-CM | POA: Insufficient documentation

## 2017-11-26 DIAGNOSIS — Z8249 Family history of ischemic heart disease and other diseases of the circulatory system: Secondary | ICD-10-CM | POA: Insufficient documentation

## 2017-11-26 DIAGNOSIS — K861 Other chronic pancreatitis: Secondary | ICD-10-CM | POA: Insufficient documentation

## 2017-11-26 DIAGNOSIS — F17210 Nicotine dependence, cigarettes, uncomplicated: Secondary | ICD-10-CM | POA: Insufficient documentation

## 2017-11-26 DIAGNOSIS — K219 Gastro-esophageal reflux disease without esophagitis: Secondary | ICD-10-CM | POA: Insufficient documentation

## 2017-11-26 DIAGNOSIS — Z9049 Acquired absence of other specified parts of digestive tract: Secondary | ICD-10-CM | POA: Insufficient documentation

## 2017-11-26 DIAGNOSIS — K859 Acute pancreatitis without necrosis or infection, unspecified: Secondary | ICD-10-CM

## 2017-11-26 DIAGNOSIS — Z87898 Personal history of other specified conditions: Secondary | ICD-10-CM | POA: Insufficient documentation

## 2017-11-26 DIAGNOSIS — Z9889 Other specified postprocedural states: Secondary | ICD-10-CM | POA: Insufficient documentation

## 2017-11-26 DIAGNOSIS — Z1331 Encounter for screening for depression: Secondary | ICD-10-CM | POA: Insufficient documentation

## 2017-11-26 DIAGNOSIS — I10 Essential (primary) hypertension: Secondary | ICD-10-CM | POA: Insufficient documentation

## 2017-11-26 DIAGNOSIS — Z79899 Other long term (current) drug therapy: Secondary | ICD-10-CM | POA: Insufficient documentation

## 2017-11-26 MED ORDER — PANCRELIPASE (LIP-PROT-AMYL) 24000-76000 UNITS PO CPEP: ORAL_CAPSULE | ORAL | 3 refills | Status: DC

## 2017-11-26 MED ORDER — TRAMADOL HCL 50 MG PO TABS: 50.0000 mg | ORAL_TABLET | Freq: Two times a day (BID) | ORAL | 1 refills | Status: DC | PRN

## 2017-11-26 NOTE — Progress Notes (Signed)
Patient ID: Brent Rogers, male    DOB: Feb 21, 1966  MRN: [id number]  CC: Hypertension   Subjective: Brent Rogers is a 52 y.o. male who presents for chronic ds management His concerns today include:  52 year old male with history of chronic pancreatitiss/p benign pancreatic tumor excision with Whipple procedure, fhx of pancreatic cancer in father,hypertension, GERD  1.  He is feeling depressed.  He was turned down for disability.  He states the judge question my description of his chronic abdominal pain being moderate versus hospital description of it being severe.  His lawyer plans to refile.  He has been turned down several times.  2.  On last visit we increased Creon to 3 times a day.  He reports some decrease in pain with this.  However he has been getting only 1 month supply through the mail which means he runs out early.  Still goes to the bathroom multiple times a day which makes it hard for him to hold down a job. -Slight flare of pancreatitis last evening with some nausea and one episode of vomiting.  Feeling better today.  He has had liquids only  -He is out of tramadol.  Requesting refill.  3.  HTN:  Norvasc inc on last visit. Taking as prescribed but out for several days -+HA, no CP, LE edema.  Plans to refill tomorrow  Patient Active Problem List   Diagnosis Date Noted  . Pancreatitis 07/07/2017  . Chronic pancreatitis ([place]) 05/01/2017  . Malnutrition of moderate degree 03/20/2017  . Foot ulcer with fat layer exposed ([place])   . Foot callus 12/25/2015  . Onychomycosis of toenail 12/25/2015  . Fatty liver 11/12/2015  . GERD (gastroesophageal reflux disease) 11/12/2015  . HTN (hypertension) 07/10/2015  . S/P cholecystectomy 07/10/2015  . Tobacco abuse 12/26/2014  . Protein-calorie malnutrition, severe ([place]) 11/20/2014  . Exocrine pancreatic insufficiency ([place]) 12/23/2013     Current Outpatient Medications on File Prior to Visit  Medication Sig Dispense Refill  .  acetaminophen (TYLENOL) 500 MG tablet Take 1,000 mg by mouth every 6 (six) hours as needed for moderate pain or headache.    Marland Kitchen amLODipine (NORVASC) 5 MG tablet Take 1.5 tablets (7.5 mg total) by mouth daily. 45 tablet 11  . bismuth subsalicylate (PEPTO BISMOL) 262 MG/15ML suspension Take 30 mLs by mouth every 6 (six) hours as needed for indigestion.    . pantoprazole (PROTONIX) 40 MG tablet Take 1 tablet (40 mg total) by mouth daily. 30 tablet 5  . traZODone (DESYREL) 50 MG tablet Take 0.5 tablets (25 mg total) by mouth at bedtime. 30 tablet 1   No current facility-administered medications on file prior to visit.     No Known Allergies  Social History   Socioeconomic History  . Marital status: Divorced    Spouse name: Not on file  . Number of children: Not on file  . Years of education: Not on file  . Highest education level: Not on file  Social Needs  . Financial resource strain: Not on file  . Food insecurity - worry: Not on file  . Food insecurity - inability: Not on file  . Transportation needs - medical: Not on file  . Transportation needs - non-medical: Not on file  Occupational History  . Not on file  Tobacco Use  . Smoking status: Current Every Day Smoker    Packs/day: 0.25    Years: 30.00    Pack years: 7.50    Types: Cigarettes  .  Smokeless tobacco: Former Systems developer    Types: Snuff  . Tobacco comment: "used snuff 10-15 years in my 20s-30s"  Substance and Sexual Activity  . Alcohol use: Yes    Alcohol/week: 2.4 oz    Types: 4 Cans of beer per week    Comment: 05/01/16  . Drug use: No  . Sexual activity: Not Currently    Partners: Male  Other Topics Concern  . Not on file  Social History Narrative  . Not on file    Family History  Problem Relation Age of Onset  . Cancer Mother        unsure  . Hypertension Father     Past Surgical History:  Procedure Laterality Date  . BACK SURGERY    . EUS N/A 09/21/2013   Procedure: ESOPHAGEAL ENDOSCOPIC ULTRASOUND  (EUS) RADIAL;  Surgeon: Beryle Beams, MD;  [facility];  Service: Endoscopy;  Laterality: N/A;  . EUS N/A 09/30/2013   Procedure: UPPER ENDOSCOPIC ULTRASOUND (EUS) LINEAR;  Surgeon: Beryle Beams, MD;  [facility];  Service: Endoscopy;  Laterality: N/A;  . FINE NEEDLE ASPIRATION N/A 09/21/2013   Procedure: FINE NEEDLE ASPIRATION (FNA) LINEAR;  Surgeon: Beryle Beams, MD;  [facility];  Service: Endoscopy;  Laterality: N/A;  . FINGER FRACTURE SURGERY Left 1995   5th digit  . FRACTURE SURGERY    . KNEE ARTHROSCOPY Bilateral 1987-1989   right-left  . LAPAROSCOPY N/A 12/01/2013   Procedure: LAPAROSCOPY DIAGNOSTIC PANCREATICODUODENECTOMY WITH BILIARY AND PANCREATIC STENTS;  Surgeon: Stark Klein, MD;  Location: [facility];  Service: General;  Laterality: N/A;  . LUMBAR [employer] SURGERY  1990's  . WHIPPLE PROCEDURE  12/01/2013    ROS: Review of Systems Negative except as stated above PHYSICAL EXAM: BP (!) 153/95   Pulse 61   Temp 98.1 F (36.7 C) (Oral)   Resp 16   Ht 6\' 4"  (1.93 m)   Wt 151 lb 9.6 oz (68.8 kg)   SpO2 97%   BMI 18.45 kg/m   Wt Readings from Last 3 Encounters:  11/26/17 151 lb 9.6 oz (68.8 kg)  08/27/17 151 lb (68.5 kg)  07/08/17 153 lb 9.6 oz (69.7 kg)    Physical Exam   General appearance - alert, well appearing, and in no distress Mental status - alert, oriented to person, place, and time, normal mood, behavior, speech, dress, motor activity, and thought processes Neck - supple, no significant adenopathy Chest - clear to auscultation, no wheezes, rales or rhonchi, symmetric air entry Heart - normal rate, regular rhythm, normal S1, S2, no murmurs, rubs, clicks or gallops Abdomen -normal bowel sounds.  Soft, slight epigastric tenderness without guarding or rebound Extremities -no lower extremity edema. Depression screen [facility] 2/9 11/26/2017 05/01/2017 11/21/2016  Decreased Interest 2 2 0  Down, Depressed, Hopeless 2 0 0  PHQ - 2 Score 4 2 0   Altered sleeping 3 0 3  Tired, decreased energy 2 3 3   Change in appetite 3 2 3   Feeling bad or failure about yourself  2 0 0  Trouble concentrating 2 0 0  Moving slowly or fidgety/restless 1 0 0  Suicidal thoughts 0 0 -  PHQ-9 Score 17 7 9      ASSESSMENT AND PLAN: 1. Exocrine pancreatic insufficiency -I have printed a new prescription for Creon for him to take to Altha Harm in the pharmacy who does have the patient assistance program.  I have written for 270 capsules which would be a 58-month supply -  Pancrelipase, Lip-Prot-Amyl, 24000-76000 units CPEP; 24,000 units TID with meals  Dispense: 270 capsule; Refill: 3  2. Essential hypertension Not at goal.  Encouraged him to try and fill the Norvasc today.  Discussed health risks associated with uncontrolled blood pressure  3. Positive depression screening -Patient denies any suicidal ideation at this time.  He declines any counseling or medication.  He feels he is able to work through this himself  4. Recurrent pancreatitis ([place]) Pain management agreement updated August/2018 He uses tramadol sparingly. - traMADol (ULTRAM) 50 MG tablet; Take 1 tablet (50 mg total) by mouth 2 (two) times daily as needed.  Dispense: 60 tablet; Refill: 1  Patient was given the opportunity to ask questions.  Patient verbalized understanding of the plan and was able to repeat key elements of the plan.   No orders of the defined types were placed in this encounter.    Requested Prescriptions   Signed Prescriptions Disp Refills  . Pancrelipase, Lip-Prot-Amyl, 24000-76000 units CPEP 270 capsule 3    Sig: 24,000 units TID with meals  . traMADol (ULTRAM) 50 MG tablet 60 tablet 1    Sig: Take 1 tablet (50 mg total) by mouth 2 (two) times daily as needed.    Return in about 3 months (around 02/23/2018).  Karle Plumber, MD, FACP

## 2017-12-01 ENCOUNTER — Emergency Department (HOSPITAL_COMMUNITY): Payer: Self-pay

## 2017-12-01 ENCOUNTER — Encounter (HOSPITAL_COMMUNITY): Payer: Self-pay | Admitting: *Deleted

## 2017-12-01 ENCOUNTER — Other Ambulatory Visit: Payer: Self-pay

## 2017-12-01 ENCOUNTER — Observation Stay (HOSPITAL_COMMUNITY)
Admission: EM | Admit: 2017-12-01 | Discharge: 2017-12-02 | Disposition: A | Discharge status: Home / Self Care | Payer: Self-pay | Attending: Internal Medicine | Admitting: Internal Medicine

## 2017-12-01 DIAGNOSIS — K861 Other chronic pancreatitis: Secondary | ICD-10-CM | POA: Insufficient documentation

## 2017-12-01 DIAGNOSIS — R0789 Other chest pain: Principal | ICD-10-CM | POA: Insufficient documentation

## 2017-12-01 DIAGNOSIS — F17210 Nicotine dependence, cigarettes, uncomplicated: Secondary | ICD-10-CM | POA: Insufficient documentation

## 2017-12-01 DIAGNOSIS — Z79899 Other long term (current) drug therapy: Secondary | ICD-10-CM | POA: Insufficient documentation

## 2017-12-01 DIAGNOSIS — R079 Chest pain, unspecified: Secondary | ICD-10-CM | POA: Diagnosis present

## 2017-12-01 DIAGNOSIS — I10 Essential (primary) hypertension: Secondary | ICD-10-CM | POA: Insufficient documentation

## 2017-12-01 LAB — I-STAT TROPONIN, ED
Troponin i, poc: 0 ng/mL (ref 0.00–0.08)
Troponin i, poc: 0.12 ng/mL (ref 0.00–0.08)
Troponin i, poc: 0 ng/mL (ref 0.00–0.08)

## 2017-12-01 LAB — COMPREHENSIVE METABOLIC PANEL
ALT: 29 U/L (ref 17–63)
AST: 62 U/L — ABNORMAL HIGH (ref 15–41)
Albumin: 4.1 g/dL (ref 3.5–5.0)
Alkaline Phosphatase: 197 U/L — ABNORMAL HIGH (ref 38–126)
Anion gap: 13 (ref 5–15)
BUN: 5 mg/dL — ABNORMAL LOW (ref 6–20)
CO2: 25 mmol/L (ref 22–32)
Calcium: 9 mg/dL (ref 8.9–10.3)
Chloride: 103 mmol/L (ref 101–111)
Creatinine, Ser: 0.93 mg/dL (ref 0.61–1.24)
GFR calc Af Amer: >60 mL/min (ref >60)
GFR calc non Af Amer: >60 mL/min (ref >60)
Glucose, Bld: 120 mg/dL — ABNORMAL HIGH (ref 65–99)
Potassium: 4.5 mmol/L (ref 3.5–5.1)
Sodium: 141 mmol/L (ref 135–145)
Total Bilirubin: 0.7 mg/dL (ref 0.3–1.2)
Total Protein: 7.6 g/dL (ref 6.5–8.1)

## 2017-12-01 LAB — URINALYSIS, ROUTINE W REFLEX MICROSCOPIC
Bilirubin Urine: NEGATIVE
Glucose, UA: NEGATIVE mg/dL
Hgb urine dipstick: NEGATIVE
Ketones, ur: 5 mg/dL — AB
Leukocytes, UA: NEGATIVE
Nitrite: NEGATIVE
Protein, ur: NEGATIVE mg/dL
Specific Gravity, Urine: 1.004 — ABNORMAL LOW (ref 1.005–1.030)
pH: 5 (ref 5.0–8.0)

## 2017-12-01 LAB — CBC
HCT: 45.8 % (ref 39.0–52.0)
Hemoglobin: 15.5 g/dL (ref 13.0–17.0)
MCH: 28 pg (ref 26.0–34.0)
MCHC: 33.8 g/dL (ref 30.0–36.0)
MCV: 82.8 fL (ref 78.0–100.0)
Platelets: 302 10*3/uL (ref 150–400)
RBC: 5.53 MIL/uL (ref 4.22–5.81)
RDW: 14.9 % (ref 11.5–15.5)
WBC: 5.8 10*3/uL (ref 4.0–10.5)

## 2017-12-01 LAB — TROPONIN I: Troponin I: <0.03 ng/mL (ref <0.03)

## 2017-12-01 LAB — LIPASE, BLOOD: Lipase: 17 U/L (ref 11–51)

## 2017-12-01 MED ORDER — SODIUM CHLORIDE 0.9 % IV BOLUS (SEPSIS)
1000.0000 mL | Freq: Once | INTRAVENOUS | Status: AC
  Administered 2017-12-01: 1000 mL via INTRAVENOUS

## 2017-12-01 MED ORDER — ACETAMINOPHEN 325 MG PO TABS: 650.0000 mg | ORAL_TABLET | ORAL | Status: DC | PRN

## 2017-12-01 MED ORDER — MORPHINE SULFATE (PF) 4 MG/ML IV SOLN
4.0000 mg | Freq: Once | INTRAVENOUS | Status: AC
  Administered 2017-12-01: 4 mg via INTRAVENOUS
  Filled 2017-12-01: qty 1

## 2017-12-01 MED ORDER — PANTOPRAZOLE SODIUM 40 MG PO TBEC
40.0000 mg | DELAYED_RELEASE_TABLET | Freq: Every day | ORAL | Status: DC
  Administered 2017-12-02 (×2): 40 mg via ORAL
  Filled 2017-12-01 (×2): qty 1

## 2017-12-01 MED ORDER — NITROGLYCERIN 0.4 MG SL SUBL
0.4000 mg | SUBLINGUAL_TABLET | SUBLINGUAL | Status: DC | PRN
  Administered 2017-12-01 – 2017-12-02 (×2): 0.4 mg via SUBLINGUAL
  Filled 2017-12-01 (×3): qty 1

## 2017-12-01 MED ORDER — BISMUTH SUBSALICYLATE 262 MG/15ML PO SUSP: 30.0000 mL | Freq: Four times a day (QID) | ORAL | Status: DC | PRN

## 2017-12-01 MED ORDER — MORPHINE SULFATE (PF) 4 MG/ML IV SOLN
2.0000 mg | INTRAVENOUS | Status: DC | PRN
  Administered 2017-12-01 – 2017-12-02 (×2): 2 mg via INTRAVENOUS
  Administered 2017-12-02: 4 mg via INTRAVENOUS
  Filled 2017-12-01 (×3): qty 1

## 2017-12-01 MED ORDER — IOPAMIDOL (ISOVUE-300) INJECTION 61%
INTRAVENOUS | Status: AC
  Administered 2017-12-01: 100 mL
  Filled 2017-12-01: qty 100

## 2017-12-01 MED ORDER — ONDANSETRON HCL 4 MG/2ML IJ SOLN
4.0000 mg | Freq: Once | INTRAMUSCULAR | Status: AC
  Administered 2017-12-01: 4 mg via INTRAVENOUS
  Filled 2017-12-01: qty 2

## 2017-12-01 MED ORDER — PANCRELIPASE (LIP-PROT-AMYL) 12000-38000 UNITS PO CPEP
24000.0000 [IU] | ORAL_CAPSULE | Freq: Three times a day (TID) | ORAL | Status: DC
  Administered 2017-12-02 (×2): 24000 [IU] via ORAL
  Filled 2017-12-01 (×3): qty 2

## 2017-12-01 MED ORDER — AMLODIPINE BESYLATE 5 MG PO TABS
7.5000 mg | ORAL_TABLET | Freq: Every day | ORAL | Status: DC
  Administered 2017-12-02: 7.5 mg via ORAL
  Filled 2017-12-01: qty 1

## 2017-12-01 MED ORDER — ONDANSETRON HCL 4 MG/2ML IJ SOLN
4.0000 mg | Freq: Four times a day (QID) | INTRAMUSCULAR | Status: DC | PRN
  Administered 2017-12-01 – 2017-12-02 (×2): 4 mg via INTRAVENOUS
  Filled 2017-12-01 (×2): qty 2

## 2017-12-01 MED ORDER — TRAZODONE HCL 50 MG PO TABS
25.0000 mg | ORAL_TABLET | Freq: Every day | ORAL | Status: DC
  Administered 2017-12-02: 25 mg via ORAL
  Filled 2017-12-01: qty 1

## 2017-12-01 MED ORDER — ENOXAPARIN SODIUM 40 MG/0.4ML ~~LOC~~ SOLN
40.0000 mg | SUBCUTANEOUS | Status: DC
  Administered 2017-12-02: 40 mg via SUBCUTANEOUS
  Filled 2017-12-01: qty 0.4

## 2017-12-01 MED ORDER — IOPAMIDOL (ISOVUE-370) INJECTION 76%
INTRAVENOUS | Status: AC
  Administered 2017-12-01: 100 mL
  Filled 2017-12-01: qty 100

## 2017-12-01 NOTE — ED Notes (Signed)
Got patient undress into a gown patient is resting with call bell in reach 

## 2017-12-01 NOTE — ED Notes (Signed)
Patient reports one time diarrhea

## 2017-12-01 NOTE — ED Provider Notes (Signed)
Received patient at signout from [facility].  Refer to provider note for full history and physical examination.  Briefly, patient is a 52 year old male with history of hypertension and chronic pancreatitis status post Whipple who presents for evaluation of abdominal pain and chest pain for 4 days.  He has also had associated nausea, vomiting,and diarrhea.  Initial troponin 0.00, second troponin elevated at 0.12, third troponin 0.00.  EKG shows new T wave inversions in V1.  Pain improved with nitroglycerin.  Cardiology and hospitalist service have been consulted, neither of whom think the patient requires admission for chest pain rule out and troponin trending.  Plan to give IV fluids and pain medicine and reassess at 7 PM.  If pain has improved, patient stable for discharge with follow-up with his PCP on an outpatient basis.  If pain persists, patient likely requires admission for chest pain rule out.  Currently rates pain as 5/10 in severity.  Physical Exam  BP 111/86   Pulse (!) 56   Temp 99.1 F (37.3 C) (Oral)   Resp (!) 21   Ht 6\' 4"  (1.93 m)   Wt 68.5 kg (151 lb)   SpO2 95%   BMI 18.38 kg/m   Physical Exam  Constitutional: He appears well-developed and well-nourished. No distress.  HENT:  Head: Normocephalic and atraumatic.  Eyes: Conjunctivae are normal. Right eye exhibits no discharge. Left eye exhibits no discharge.  Neck: Normal range of motion. No JVD present. No tracheal deviation present.  Cardiovascular: Normal rate and regular rhythm.  Pulses:      Radial pulses are 2+ on the right side, and 2+ on the left side.       Dorsalis pedis pulses are 2+ on the right side, and 2+ on the left side.       Posterior tibial pulses are 2+ on the right side, and 2+ on the left side.  Pulmonary/Chest: Effort normal and breath sounds normal. No accessory muscle usage. No respiratory distress.  Abdominal: He exhibits no distension.  Musculoskeletal: He exhibits no edema.       Right lower  leg: Normal. He exhibits no tenderness and no edema.       Left lower leg: Normal. He exhibits no tenderness and no edema.  Neurological: He is alert.  Skin: Skin is warm and dry. No erythema.  Psychiatric: He has a normal mood and affect. His behavior is normal.  Nursing note and vitals reviewed.   ED Course/Procedures     Procedures  MDM  7:30PM Patient states that pain "improved for just a few minutes "and then worsened.  He currently rates pain as 7/10 in severity. He has a HEART score of 4. Spoke with Dr. Alcario Drought with [facility] hospitalist service who agrees to assume care of patient and bring him into the hospital for observation and serial troponins.       Renita Papa, PA-C 12/01/17 2306    Tegeler, Gwenyth Allegra, MD 12/02/17 [phone]

## 2017-12-01 NOTE — ED Triage Notes (Signed)
Pt arrives via POv from home with diarrhea, vomiting for the past several days which developed into CP and back pain. Pt admits to drinking recently with hx of pancreatitis. Believes this is a flare of his symptoms. Pt reports nothing able to stay down.

## 2017-12-01 NOTE — Discharge Instructions (Addendum)
You can take Tylenol or Ibuprofen as directed for pain. You can alternate Tylenol and Ibuprofen every 4 hours. If you take Tylenol at 1pm, then you can take Ibuprofen at 5pm. Then you can take Tylenol again at 9pm.    Take zofran as directed for nausea.   Follow-up with your primary care doctor in the next 24-48 hours for further evaluation.   Return to the Emergency Department immediately if you experiencing worsening chest pain, difficulty breathing, nausea/vomiting, get very sweaty, headache or any other worsening or concerning symptoms.   [place] Hospital Stay Proper nutrition can help your body recover from illness and injury.   Foods and beverages high in protein, vitamins, and minerals help rebuild muscle loss, promote healing, & reduce fall risk.   In addition to eating healthy foods, a nutrition shake is an easy, delicious way to get the nutrition you need during and after your hospital stay  It is recommended that you continue to drink 2 bottles per day of:       Boost Breeze for at least 1 month (30 days) after your hospital stay   Tips for adding a nutrition shake into your routine: As allowed, drink one with vitamins or medications instead of water or juice Enjoy one as a tasty mid-morning or afternoon snack Drink cold or make a milkshake out of it Drink one instead of milk with cereal or snacks Use as a coffee creamer   Available at the following grocery stores and pharmacies:           * [employer] [phone]            For COUPONS visit: www.ensure.com/join or http://dawson-may.com/   Suggested Substitutions Ensure Plus = Boost Plus = Carnation Breakfast Essentials = Boost Compact Ensure Active Clear = Boost Breeze Glucerna Shake = Boost Glucose Control = Carnation Breakfast Essentials  SUGAR FREE

## 2017-12-01 NOTE — ED Notes (Signed)
Patient reports feeling pain on her right knee. 10 on a 0-10 scale. At baseline PT uses a walker per family.  Family at bedside.

## 2017-12-01 NOTE — ED Provider Notes (Signed)
[place] EMERGENCY DEPARTMENT Provider Note   CSN: [id number] Arrival date & time: 12/01/17  6578     History   Chief Complaint Chief Complaint  Patient presents with  . Chest Pain    HPI Brent Rogers is a 52 y.o. male Past medical history of hypertension, chronic pancreatitis who presents for evaluation of left upper abdominal pain, chest pain x 4 days.  Past medical history of hypertension, chronic pancreatitis who presents for evaluation of left upper abdominal pain, chest pain.  Patient reports that he has had symptoms for the last 4 days.  He reports that initially, symptoms began with nausea/vomiting and diarrhea.  Additionally, patient had left upper quadrant pain that radiated to his back.  He states that symptoms felt consistent with his pancreatitis.  Patient reports that over the last 2 days, he has had intermittent midsternal chest pain that he describes as a "burning sensation." Patient reports that initially he states that pain is worsened with deep inspiration but states it is not worse with exertion.  He reports that he has had several episodes of nonbloody, nonbilious emesis and nonbloody stools.  His last bowel movement was earlier today.  Patient reports that he does smoke cigarettes.  He denies any cocaine, heroin, marijuana use.  Patient reports that he has been drinking alcohol lately and reports that he drank one beer yesterday. Patient has had some discomfort with urination.  No blood noted in the urine. He denies any personal cardiac history.   The history is provided by the patient.    Past Medical History:  Diagnosis Date  . Benign tumor of endocrine pancreas   . Chronic pancreatitis ([place])    S/P Whipple  . Hypertension   . Migraine    "@ least once/month" (11/12/2015)  . Pancreatic abnormality    CT  shows mass  . Pneumonia 11/12/2015  . Scoliosis     Patient Active Problem List   Diagnosis Date Noted  . Pancreatitis 07/07/2017    . Chronic pancreatitis ([place]) 05/01/2017  . Malnutrition of moderate degree 03/20/2017  . Foot ulcer with fat layer exposed ([place])   . Foot callus 12/25/2015  . Onychomycosis of toenail 12/25/2015  . Fatty liver 11/12/2015  . GERD (gastroesophageal reflux disease) 11/12/2015  . HTN (hypertension) 07/10/2015  . S/P cholecystectomy 07/10/2015  . Tobacco abuse 12/26/2014  . Protein-calorie malnutrition, severe ([place]) 11/20/2014  . Exocrine pancreatic insufficiency ([place]) 12/23/2013    Past Surgical History:  Procedure Laterality Date  . BACK SURGERY    . EUS N/A 09/21/2013   Procedure: ESOPHAGEAL ENDOSCOPIC ULTRASOUND (EUS) RADIAL;  Surgeon: Beryle Beams, MD;  Location: [facility];  Service: Endoscopy;  Laterality: N/A;  . EUS N/A 09/30/2013   Procedure: UPPER ENDOSCOPIC ULTRASOUND (EUS) LINEAR;  Surgeon: Beryle Beams, MD;  Location: [facility] END[facility];  Service: Endoscopy;  Laterality: N/A;  . FINE NEEDLE ASPIRATION N/A 09/21/2013   Procedure: FINE NEEDLE ASPIRATION (FNA) LINEAR;  Surgeon: Beryle Beams, MD;  Location: [facility] END[facility];  Service: Endoscopy;  Laterality: N/A;  . FINGER FRACTURE SURGERY Left 1995   5th digit  . FRACTURE SURGERY    . KNEE ARTHROSCOPY Bilateral 1987-1989   right-left  . LAPAROSCOPY N/A 12/01/2013   Procedure: LAPAROSCOPY DIAGNOSTIC PANCREATICODUODENECTOMY WITH BILIARY AND PANCREATIC STENTS;  Surgeon: Stark Klein, MD;  Location: [facility] ORS;  Service: General;  Laterality: N/A;  . LUMBAR [place] SURGERY  1990's  . WHIPPLE PROCEDURE  12/01/2013  Home Medications    Prior to Admission medications   Medication Sig Start Date End Date Taking? Authorizing Provider  acetaminophen (TYLENOL) 500 MG tablet Take 1,000 mg by mouth every 6 (six) hours as needed for moderate pain or headache.   Yes [provider]  amLODipine (NORVASC) 5 MG tablet Take 1.5 tablets (7.5 mg total) by mouth daily. 08/27/17  Yes Ladell Pier, MD  bismuth subsalicylate (PEPTO  BISMOL) 262 MG/15ML suspension Take 30 mLs by mouth every 6 (six) hours as needed for indigestion.   Yes [provider]  Pancrelipase, Lip-Prot-Amyl, 24000-76000 units CPEP 24,000 units TID with meals 11/26/17  Yes Ladell Pier, MD  pantoprazole (PROTONIX) 40 MG tablet Take 1 tablet (40 mg total) by mouth daily. 05/01/17  Yes Ladell Pier, MD  traMADol (ULTRAM) 50 MG tablet Take 1 tablet (50 mg total) by mouth 2 (two) times daily as needed. Patient taking differently: Take 50 mg by mouth 2 (two) times daily as needed for moderate pain.  11/26/17  Yes Ladell Pier, MD  traZODone (DESYREL) 50 MG tablet Take 0.5 tablets (25 mg total) by mouth at bedtime. 07/09/17  Yes Nita Sells, MD    Family History Family History  Problem Relation Age of Onset  . Cancer Mother        unsure  . Hypertension Father     Social History Social History   Tobacco Use  . Smoking status: Current Every Day Smoker    Packs/day: 0.25    Years: 30.00    Pack years: 7.50    Types: Cigarettes  . Smokeless tobacco: Former Systems developer    Types: Snuff  . Tobacco comment: "used snuff 10-15 years in my 20s-30s"  Substance Use Topics  . Alcohol use: Yes    Alcohol/week: 2.4 oz    Types: 4 Cans of beer per week    Comment: 05/01/16  . Drug use: No     Allergies   Patient has no known allergies.   Review of Systems Review of Systems  Constitutional: Negative for fever.  HENT: Positive for congestion.   Respiratory: Positive for cough. Negative for shortness of breath.   Cardiovascular: Negative for chest pain.  Gastrointestinal: Positive for abdominal pain, diarrhea, nausea and vomiting. Negative for blood in stool.  Genitourinary: Negative for dysuria and hematuria.  Musculoskeletal: Positive for back pain. Negative for neck pain.  Skin: Negative for rash.  Neurological: Negative for dizziness, weakness, numbness and headaches.  Psychiatric/Behavioral: Negative for confusion.    All other systems reviewed and are negative.    Physical Exam Updated Vital Signs BP 111/86   Pulse (!) 56   Temp 99.1 F (37.3 C) (Oral)   Resp (!) 21   Ht '6\' 4"'$  (1.93 m)   Wt 68.5 kg (151 lb)   SpO2 95%   BMI 18.38 kg/m   Physical Exam  Constitutional: He is oriented to person, place, and time. He appears well-developed and well-nourished.  HENT:  Head: Normocephalic and atraumatic.  Mouth/Throat: Oropharynx is clear and moist and mucous membranes are normal.  Eyes: Conjunctivae, EOM and lids are normal. Pupils are equal, round, and reactive to light.  Neck: Full passive range of motion without pain.  Cardiovascular: Normal rate, regular rhythm, normal heart sounds and normal pulses. Exam reveals no gallop and no friction rub.  No murmur heard. Pulmonary/Chest: Effort normal and breath sounds normal.  No evidence of respiratory distress. Able to speak in full sentences without difficulty.  Abdominal: Soft. Normal appearance. There is tenderness in the left upper quadrant. There is CVA tenderness (left). There is no rigidity, no guarding, no tenderness at McBurney's point and negative Murphy's sign.  Abdomen is soft, non-distended.   Musculoskeletal: Normal range of motion.  Neurological: He is alert and oriented to person, place, and time.  Skin: Skin is warm and dry. Capillary refill takes less than 2 seconds.  Psychiatric: He has a normal mood and affect. His speech is normal.  Nursing note and vitals reviewed.    ED Treatments / Results  Labs (all labs ordered are listed, but only abnormal results are displayed) Labs Reviewed  COMPREHENSIVE METABOLIC PANEL - Abnormal; Notable for the following components:      Result Value   Glucose, Bld 120 (*)    BUN 5 (*)    AST 62 (*)    Alkaline Phosphatase 197 (*)    All other components within normal limits  URINALYSIS, ROUTINE W REFLEX MICROSCOPIC - Abnormal; Notable for the following components:   Color, Urine STRAW  (*)    Specific Gravity, Urine 1.004 (*)    Ketones, ur 5 (*)    All other components within normal limits  I-STAT TROPONIN, ED - Abnormal; Notable for the following components:   Troponin i, poc 0.12 (*)    All other components within normal limits  CBC  LIPASE, BLOOD  I-STAT TROPONIN, ED  I-STAT TROPONIN, ED    EKG  EKG Interpretation  Date/Time:  Tuesday December 01 2017 08:58:25 EST Ventricular Rate:  85 PR Interval:  148 QRS Duration: 82 QT Interval:  364 QTC Calculation: 433 R Axis:   69 Text Interpretation:  Normal sinus rhythm Biatrial enlargement Abnormal ECG No significant change since last tracing Confirmed by Gareth Morgan [phone]) on 12/01/2017 10:19:40 AM       Radiology Dg Chest 2 View  Result Date: 12/01/2017 CLINICAL DATA:  Chest pain, back pain, history of pancreatitis EXAM: CHEST  2 VIEW COMPARISON:  03/19/2017 FINDINGS: Lungs are clear.  No pleural effusion or pneumothorax. The heart is normal in size. Visualized osseous structures are within normal limits. IMPRESSION: Normal chest radiographs. Electronically Signed   By: Julian Hy M.D.   On: 12/01/2017 09:40   Ct Angio Chest Pe W And/or Wo Contrast  Result Date: 12/01/2017 CLINICAL DATA:  52 y/o  M; chest pain and shortness of breath. EXAM: CT ANGIOGRAPHY CHEST WITH CONTRAST TECHNIQUE: Multidetector CT imaging of the chest was performed using the standard protocol during bolus administration of intravenous contrast. Multiplanar CT image reconstructions and MIPs were obtained to evaluate the vascular anatomy. CONTRAST:  159m ISOVUE-370 IOPAMIDOL (ISOVUE-370) INJECTION 76% COMPARISON:  11/12/2015 CT chest with contrast. FINDINGS: Cardiovascular: Satisfactory opacification of the pulmonary arteries to the segmental level. No evidence of pulmonary embolism. Normal heart size. No pericardial effusion. Mild coronary artery calcification. Mediastinum/Nodes: No enlarged mediastinal, hilar, or axillary lymph nodes.  Thyroid gland, trachea, and esophagus demonstrate no significant findings. Lungs/Pleura: Mild paraseptal emphysema. No consolidation, effusion, or pneumothorax. Few tiny perifissural nodules compatible with intrapulmonary lymph nodes. Upper Abdomen: Mild pneumobilia, likely related to history of prior Whipple procedure. Musculoskeletal: No chest wall abnormality. No acute or significant osseous findings. Review of the MIP images confirms the above findings. IMPRESSION: 1. No pulmonary embolus identified. 2. Mild paraseptal emphysema. 3. Mild coronary artery calcification. 4. No acute pulmonary process. Electronically Signed   By: LKristine GarbeM.D.   On: 12/01/2017 14:54   Ct Abdomen Pelvis  W Contrast  Result Date: 12/01/2017 CLINICAL DATA:  Left-sided abdominal pain for 3 days.  Diarrhea. EXAM: CT ABDOMEN AND PELVIS WITH CONTRAST TECHNIQUE: Multidetector CT imaging of the abdomen and pelvis was performed using the standard protocol following bolus administration of intravenous contrast. CONTRAST:  122m ISOVUE-300 IOPAMIDOL (ISOVUE-300) INJECTION 61% COMPARISON:  CT abdomen pelvis 07/07/2017. FINDINGS: Lower chest: Linear atelectasis is present at the right lung base. The lung bases are otherwise clear. The heart size is normal. No significant pleural or pericardial effusion is present. Hepatobiliary: Gas is present within the intrahepatic biliary ducts of the left lobe. Edema is improved. No focal hepatic lesions are present. Cholecystectomy is present. The common bile duct is within normal limits following Whipple procedure. Pancreas: Whipple procedure is noted. The residual pancreas is unremarkable. Spleen: Normal in size without focal abnormality. Adrenals/Urinary Tract: Adrenal glands are normal bilaterally. Kidneys and ureters are within normal limits. The urinary bladder is unremarkable. Stomach/Bowel: Stomach and duodenum are within normal limits. Small bowel is unremarkable. The terminal  ileum is within normal limits. The ascending and transverse colon are normal. The descending and sigmoid colon are normal. No obstruction or inflammatory changes are evident. Vascular/Lymphatic: Atherosclerotic calcifications are present in the distal aorta and branch vessels without aneurysm. No significant adenopathy is present. Reproductive: Prostate is unremarkable. Other: No abdominal wall hernia or abnormality. No abdominopelvic ascites. Musculoskeletal: Multilevel degenerate changes in the lumbar spine are again noted. Vertebral body heights are maintained. Alignment is anatomic. A vacuum disc is present at L5-S1. Disc disease and narrowing is present at L3-4, L4-5, and L5-S1. Bony pelvis is intact. The hips are located and within normal limits bilaterally. IMPRESSION: 1. No acute or focal abnormalities to explain the patient's abdominal pain. 2. Sequela of Whipple procedure are stable. 3. Degenerative changes in the lower lumbar spine. Electronically Signed   By: CSan MorelleM.D.   On: 12/01/2017 12:34    Procedures Procedures (including critical care time)  Medications Ordered in ED Medications  nitroGLYCERIN (NITROSTAT) SL tablet 0.4 mg (0.4 mg Sublingual Given 12/01/17 1613)  sodium chloride 0.9 % bolus 1,000 mL (not administered)  morphine 4 MG/ML injection 4 mg (not administered)  sodium chloride 0.9 % bolus 1,000 mL (0 mLs Intravenous Stopped 12/01/17 1130)  ondansetron (ZOFRAN) injection 4 mg (4 mg Intravenous Given 12/01/17 1024)  iopamidol (ISOVUE-300) 61 % injection (100 mLs  Contrast Given 12/01/17 1207)  morphine 4 MG/ML injection 4 mg (4 mg Intravenous Given 12/01/17 1129)  iopamidol (ISOVUE-370) 76 % injection (100 mLs  Contrast Given 12/01/17 1427)     Initial Impression / Assessment and Plan / ED Course  I have reviewed the triage vital signs and the nursing notes.  Pertinent labs & imaging results that were available during my care of the patient were reviewed by me and  considered in my medical decision making (see chart for details).     52y.o. M Past medical history of chronic pancreatitis, pancreatic tumor who presents for evaluation of left upper quadrant and epigastric pain times 4 days.  He also reports associated nausea/vomiting diarrhea.  Patient reports 2 days ago, he started having a burning sensation to midsternal chest.  No fevers, difficulty breathing.  Has had some discomfort with urination but no hematuria.  On exam, he does have tenderness palpation to the left upper quadrant and epigastric region.  He does have some left-sided CVA tenderness. Patient is afebrile, non-toxic appearing, sitting comfortably on examination table. Vital signs reviewed and  stable. Consider pancreatitis versus acute infectious etiology versus GU etiology versus ACS etiology.  History/physical exam is not concerning for dissection or PE. Labs ordered at triage. IVF given for fluid resuscitation. Analgesics provided in the department.  Troponin negative.  CMP shows elevation in AST and alk phos.  This is a rise from his most recent CMP which was approximately 4 months ago.  CBC without any significant leukocytosis or anemia.  Lipase is unremarkable. Chest x-ray is negative for any acute abnormality. EKG shows sinus rate 85. Looks similar to previous.   CT abd/pelvis shows no evidence of any acute abdominal abnormalities. Discussed patient.  He is still having significant pain.  We will plan to repeat troponin.  Troponin repeated.  Is elevated at 0.12.  Will plan for CTA of chest for evaluation of any aortic abnormality or PE.  Will plan to consult cardiology.  Discussed patient with Dr. Arlyce Dice (Cardiology). Given story and presentation, recommends proceeding with CTA   CTA negative for any acute PE.  No evidence of aortic abnormality.  Plan for delta troponin. Will give nitro for pain.   Patient reports some improvement in pain after nature.  He reports it is gone to a  6/10.  Repeat troponin is negative.  I discussed with Dr. Arlyce Dice (Cardiology).  Given negative troponin, no acute cardiac interventions noted.  Feels that the second troponin may have been falsely elevated. Patient does have a heart score of 5.  Given risk factors and the patient is continued to have pain, and a positive troponin, recommend admission for further observation.  I discussed with Dr. Bonney Roussel (Hospitalist).  Given that patient has had a repeat troponin that is negative and that cardiology feels that this is not acute in nature, does not recommend admission at this time.  If cardiology would like to admit, he recommends calling cardiology.  He recommends giving additional dose of pain medication of fluids here in the ED and reassess in patient's pain.  If patient's pain improves, he can follow-up outpatient.  Discussed patient with Dr. Etter Sjogren (cardiology) regarding patient.  He agrees that this is not cardiac in nature and feels that patient can be appropriately discharged home with primary care follow-up.  He does not feel that patient needs admission for cardiac workup.  Patient signed out to Rodell Perna, PA-C with plans for additional analgesics or IVF. Plan to reassess. If patient has improvement in pain, plan for dispo home with PCP follow-up. Please see her note for further ED course.   Final Clinical Impressions(s) / ED Diagnoses   Final diagnoses:  Chest pain, unspecified type    ED Discharge Orders    None       Volanda Napoleon, PA-C 12/01/17 2205    Gareth Morgan, MD 12/02/17 1512

## 2017-12-01 NOTE — ED Notes (Signed)
ED Provider at bedside. 

## 2017-12-01 NOTE — H&P (Addendum)
History and Physical    Brent Rogers STM:[id number] DOB: August 26, 1966 DOA: 12/01/2017  PCP: Ladell Pier, MD  Patient coming from: Home  I have personally briefly reviewed patient's old medical records in [place]  Chief Complaint: Chest pain  HPI: Brent Rogers is a 52 y.o. 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  Patient presents to the ED today with chest pain.  Pain is located in L chest, radiates to back.  Is not at all like his chronic abd pain (which he says isnt doing too bad today).   ED Course: I-stat trops were 0.00, 0.12, and 0.00.  EKG seems to show new flipped T waves in V1 when compared to prior EKGs.  CTA PE and CT abd/pelvis showed nothing acute.   Review of Systems: As per HPI otherwise 10 point review of systems negative.   Past Medical History:  Diagnosis Date  . Benign tumor of endocrine pancreas   . Chronic pancreatitis ([place])    S/P Whipple  . Hypertension   . Migraine    "@ least once/month" (11/12/2015)  . Pancreatic abnormality    CT  shows mass  . Pneumonia 11/12/2015  . Scoliosis     Past Surgical History:  Procedure Laterality Date  . BACK SURGERY    . EUS N/A 09/21/2013   Procedure: ESOPHAGEAL ENDOSCOPIC ULTRASOUND (EUS) RADIAL;  Surgeon: Beryle Beams, MD;  Location: [facility];  Service: Endoscopy;  Laterality: N/A;  . EUS N/A 09/30/2013   Procedure: UPPER ENDOSCOPIC ULTRASOUND (EUS) LINEAR;  Surgeon: Beryle Beams, MD;  Location: [facility];  Service: Endoscopy;  Laterality: N/A;  . FINE NEEDLE ASPIRATION N/A 09/21/2013   Procedure: FINE NEEDLE ASPIRATION (FNA) LINEAR;  Surgeon: Beryle Beams, MD;  Location: [facility];  Service: Endoscopy;  Laterality: N/A;  . FINGER FRACTURE SURGERY Left 1995   5th digit  . FRACTURE SURGERY    . KNEE ARTHROSCOPY Bilateral 1987-1989   right-left  . LAPAROSCOPY N/A 12/01/2013   Procedure:  LAPAROSCOPY DIAGNOSTIC PANCREATICODUODENECTOMY WITH BILIARY AND PANCREATIC STENTS;  Surgeon: Stark Klein, MD;  Location: [facility];  Service: General;  Laterality: N/A;  . LUMBAR [place] SURGERY  1990's  . WHIPPLE PROCEDURE  12/01/2013     reports that he has been smoking cigarettes.  He has a 7.50 pack-year smoking history. He has quit using smokeless tobacco. His smokeless tobacco use included snuff. He reports that he drinks about 2.4 oz of alcohol per week. He reports that he does not use drugs.  No Known Allergies  Family History  Problem Relation Age of Onset  . Cancer Mother        unsure  . Hypertension Father      Prior to Admission medications   Medication Sig Start Date End Date Taking? Authorizing Provider  acetaminophen (TYLENOL) 500 MG tablet Take 1,000 mg by mouth every 6 (six) hours as needed for moderate pain or headache.   Yes [provider]  amLODipine (NORVASC) 5 MG tablet Take 1.5 tablets (7.5 mg total) by mouth daily. 08/27/17  Yes Ladell Pier, MD  bismuth subsalicylate (PEPTO BISMOL) 262 MG/15ML suspension Take 30 mLs by mouth every 6 (six) hours as needed for indigestion.   Yes [provider]  Pancrelipase, Lip-Prot-Amyl, 24000-76000 units CPEP 24,000 units TID with meals 11/26/17  Yes Ladell Pier, MD  pantoprazole (PROTONIX) 40 MG tablet Take 1  tablet (40 mg total) by mouth daily. 05/01/17  Yes Ladell Pier, MD  traMADol (ULTRAM) 50 MG tablet Take 1 tablet (50 mg total) by mouth 2 (two) times daily as needed. Patient taking differently: Take 50 mg by mouth 2 (two) times daily as needed for moderate pain.  11/26/17  Yes Ladell Pier, MD  traZODone (DESYREL) 50 MG tablet Take 0.5 tablets (25 mg total) by mouth at bedtime. 07/09/17  Yes Nita Sells, MD    Physical Exam: Vitals:   12/01/17 1915 12/01/17 1930 12/01/17 1945 12/01/17 2000  BP:  119/80  120/83  Pulse: 65 69 76 70  Resp: 12 15 16 13   Temp:      TempSrc:       SpO2: 95% 94% 95% 95%  Weight:      Height:        Constitutional: NAD, calm, comfortable Eyes: PERRL, lids and conjunctivae normal ENMT: Mucous membranes are moist. Posterior pharynx clear of any exudate or lesions.Normal dentition.  Neck: normal, supple, no masses, no thyromegaly Respiratory: clear to auscultation bilaterally, no wheezing, no crackles. Normal respiratory effort. No accessory muscle use.  Cardiovascular: Regular rate and rhythm, no murmurs / rubs / gallops. No extremity edema. 2+ pedal pulses. No carotid bruits.  Abdomen: no tenderness, no masses palpated. No hepatosplenomegaly. Bowel sounds positive.  Musculoskeletal: no clubbing / cyanosis. No joint deformity upper and lower extremities. Good ROM, no contractures. Normal muscle tone.  Skin: no rashes, lesions, ulcers. No induration Neurologic: CN 2-12 grossly intact. Sensation intact, DTR normal. Strength 5/5 in all 4.  Psychiatric: Normal judgment and insight. Alert and oriented x 3. Normal mood.    Labs on Admission: I have personally reviewed following labs and imaging studies  CBC: Recent Labs  Lab 12/01/17 0904  WBC 5.8  HGB 15.5  HCT 45.8  MCV 82.8  PLT 097   Basic Metabolic Panel: Recent Labs  Lab 12/01/17 0904  NA 141  K 4.5  CL 103  CO2 25  GLUCOSE 120*  BUN 5*  CREATININE 0.93  CALCIUM 9.0   GFR: Estimated Creatinine Clearance: 90 mL/min (by C-G formula based on SCr of 0.93 mg/dL). Liver Function Tests: Recent Labs  Lab 12/01/17 0904  AST 62*  ALT 29  ALKPHOS 197*  BILITOT 0.7  PROT 7.6  ALBUMIN 4.1   Recent Labs  Lab 12/01/17 0904  LIPASE 17   No results for input(s): AMMONIA in the last 168 hours. Coagulation Profile: No results for input(s): INR, PROTIME in the last 168 hours. Cardiac Enzymes: No results for input(s): CKTOTAL, CKMB, CKMBINDEX, TROPONINI in the last 168 hours. BNP (last 3 results) No results for input(s): PROBNP in the last 8760 hours. HbA1C: No  results for input(s): HGBA1C in the last 72 hours. CBG: No results for input(s): GLUCAP in the last 168 hours. Lipid Profile: No results for input(s): CHOL, HDL, LDLCALC, TRIG, CHOLHDL, LDLDIRECT in the last 72 hours. Thyroid Function Tests: No results for input(s): TSH, T4TOTAL, FREET4, T3FREE, THYROIDAB in the last 72 hours. Anemia Panel: No results for input(s): VITAMINB12, FOLATE, FERRITIN, TIBC, IRON, RETICCTPCT in the last 72 hours. Urine analysis:    Component Value Date/Time   COLORURINE STRAW (A) 12/01/2017 1238   APPEARANCEUR CLEAR 12/01/2017 1238   LABSPEC 1.004 (L) 12/01/2017 1238   PHURINE 5.0 12/01/2017 1238   GLUCOSEU NEGATIVE 12/01/2017 1238   HGBUR NEGATIVE 12/01/2017 1238   BILIRUBINUR NEGATIVE 12/01/2017 1238   KETONESUR 5 (A) 12/01/2017 1238  PROTEINUR NEGATIVE 12/01/2017 1238   UROBILINOGEN 1.0 06/23/2015 1513   NITRITE NEGATIVE 12/01/2017 1238   LEUKOCYTESUR NEGATIVE 12/01/2017 1238    Radiological Exams on Admission: Dg Chest 2 View  Result Date: 12/01/2017 CLINICAL DATA:  Chest pain, back pain, history of pancreatitis EXAM: CHEST  2 VIEW COMPARISON:  03/19/2017 FINDINGS: Lungs are clear.  No pleural effusion or pneumothorax. The heart is normal in size. Visualized osseous structures are within normal limits. IMPRESSION: Normal chest radiographs. Electronically Signed   By: Julian Hy M.D.   On: 12/01/2017 09:40   Ct Angio Chest Pe W And/or Wo Contrast  Result Date: 12/01/2017 CLINICAL DATA:  52 y/o  M; chest pain and shortness of breath. EXAM: CT ANGIOGRAPHY CHEST WITH CONTRAST TECHNIQUE: Multidetector CT imaging of the chest was performed using the standard protocol during bolus administration of intravenous contrast. Multiplanar CT image reconstructions and MIPs were obtained to evaluate the vascular anatomy. CONTRAST:  125mL ISOVUE-370 IOPAMIDOL (ISOVUE-370) INJECTION 76% COMPARISON:  11/12/2015 CT chest with contrast. FINDINGS: Cardiovascular:  Satisfactory opacification of the pulmonary arteries to the segmental level. No evidence of pulmonary embolism. Normal heart size. No pericardial effusion. Mild coronary artery calcification. Mediastinum/Nodes: No enlarged mediastinal, hilar, or axillary lymph nodes. Thyroid gland, trachea, and esophagus demonstrate no significant findings. Lungs/Pleura: Mild paraseptal emphysema. No consolidation, effusion, or pneumothorax. Few tiny perifissural nodules compatible with intrapulmonary lymph nodes. Upper Abdomen: Mild pneumobilia, likely related to history of prior Whipple procedure. Musculoskeletal: No chest wall abnormality. No acute or significant osseous findings. Review of the MIP images confirms the above findings. IMPRESSION: 1. No pulmonary embolus identified. 2. Mild paraseptal emphysema. 3. Mild coronary artery calcification. 4. No acute pulmonary process. Electronically Signed   By: Kristine Garbe M.D.   On: 12/01/2017 14:54   Ct Abdomen Pelvis W Contrast  Result Date: 12/01/2017 CLINICAL DATA:  Left-sided abdominal pain for 3 days.  Diarrhea. EXAM: CT ABDOMEN AND PELVIS WITH CONTRAST TECHNIQUE: Multidetector CT imaging of the abdomen and pelvis was performed using the standard protocol following bolus administration of intravenous contrast. CONTRAST:  183mL ISOVUE-300 IOPAMIDOL (ISOVUE-300) INJECTION 61% COMPARISON:  CT abdomen pelvis 07/07/2017. FINDINGS: Lower chest: Linear atelectasis is present at the right lung base. The lung bases are otherwise clear. The heart size is normal. No significant pleural or pericardial effusion is present. Hepatobiliary: Gas is present within the intrahepatic biliary ducts of the left lobe. Edema is improved. No focal hepatic lesions are present. Cholecystectomy is present. The common bile duct is within normal limits following Whipple procedure. Pancreas: Whipple procedure is noted. The residual pancreas is unremarkable. Spleen: Normal in size without focal  abnormality. Adrenals/Urinary Tract: Adrenal glands are normal bilaterally. Kidneys and ureters are within normal limits. The urinary bladder is unremarkable. Stomach/Bowel: Stomach and duodenum are within normal limits. Small bowel is unremarkable. The terminal ileum is within normal limits. The ascending and transverse colon are normal. The descending and sigmoid colon are normal. No obstruction or inflammatory changes are evident. Vascular/Lymphatic: Atherosclerotic calcifications are present in the distal aorta and branch vessels without aneurysm. No significant adenopathy is present. Reproductive: Prostate is unremarkable. Other: No abdominal wall hernia or abnormality. No abdominopelvic ascites. Musculoskeletal: Multilevel degenerate changes in the lumbar spine are again noted. Vertebral body heights are maintained. Alignment is anatomic. A vacuum disc is present at L5-S1. Disc disease and narrowing is present at L3-4, L4-5, and L5-S1. Bony pelvis is intact. The hips are located and within normal limits bilaterally. IMPRESSION: 1. No  acute or focal abnormalities to explain the patient's abdominal pain. 2. Sequela of Whipple procedure are stable. 3. Degenerative changes in the lower lumbar spine. Electronically Signed   By: San Morelle M.D.   On: 12/01/2017 12:34    EKG: Independently reviewed.  Assessment/Plan Active Problems:   Chest pain    1. CP - 1. CP obs pathway 2. Serial trops 1. Have once again urged EDPs to discard their I-stat devices as these are clearly inaccurate.  Troponins do not typically trend 0.00, 0.12, then completely clear back to 0.00 within 3 hours. 3. Tele monitor 4. NPO after MN 5. Morphine PRN pain 6. Cards eval in AM  DVT prophylaxis: Lovenox Code Status: Full Family Communication: No family in room Disposition Plan: Home after admit Consults called: Message put in to P.Trent for routine cards eval in AM Admission status: Place in [place],  [place] [facility] Pager [phone]  If 7AM-7PM, please contact day team taking care of patient [email or web address] Password [facility]  12/01/2017, 8:42 PM

## 2017-12-02 ENCOUNTER — Encounter (HOSPITAL_COMMUNITY): Payer: Self-pay

## 2017-12-02 DIAGNOSIS — R079 Chest pain, unspecified: Secondary | ICD-10-CM

## 2017-12-02 LAB — POCT I-STAT TROPONIN I: Troponin i, poc: 0.12 ng/mL (ref 0.00–0.08)

## 2017-12-02 LAB — CBG MONITORING, ED: Glucose-Capillary: 115 mg/dL — ABNORMAL HIGH (ref 65–99)

## 2017-12-02 LAB — TROPONIN I
Troponin I: <0.03 ng/mL (ref <0.03)
Troponin I: <0.03 ng/mL (ref <0.03)

## 2017-12-02 LAB — MRSA PCR SCREENING: MRSA by PCR: NEGATIVE

## 2017-12-02 MED ORDER — PROMETHAZINE HCL 25 MG/ML IJ SOLN
12.5000 mg | Freq: Four times a day (QID) | INTRAMUSCULAR | Status: DC | PRN
  Administered 2017-12-02: 12.5 mg via INTRAVENOUS
  Filled 2017-12-02: qty 1

## 2017-12-02 MED ORDER — BOOST / RESOURCE BREEZE PO LIQD CUSTOM: 1.0000 | Freq: Three times a day (TID) | ORAL | Status: DC

## 2017-12-02 NOTE — Consult Note (Addendum)
Cardiology Consultation:   Patient ID: Brent Rogers; [id number]; [birth year]   Admit date: 12/01/2017 Date of Consult: 12/02/2017  Primary Care Provider: Ladell Pier, MD Primary Cardiologist: No primary care provider on file. Will be new to Brent Rogers   Patient Profile:   Brent Rogers is a 52 y.o. male with a hx of chronic pancreatitis, s/p whipple procedure, HTN, Migraines, tobacco use who is being seen today for the evaluation of chest pain at the request of Brent Rogers.  History of Present Illness:   Brent Rogers has No prior cardiac history. He has a history of pancreatitis with bouts of pain and nausea. On Monday he developed N/V and upper left abd/flank pain. Yesterday his symptoms became worse with constant vomiting yellow fluid. He was unable to keep any food or liquid down. He presented to the ED. He also has some left chest constant sharp chest pain that is worse with movement and deep breathing and progressed with his vomiting. The discomfort is reproducible with palpation. He states mild shortness of breath, but he is lying almost flat and does not appear dyspneic. He says that this episode is much like his p[rior bouts with pancreatitis but much worse. He has had some dizziness and feels that he is dehydrated. No syncope.  He continues to smoke about 5 cigarettes per day. He has an occasional beer, that last was about 4 days ago. He knows of no cardiac issues in his parents or 5 brothers, but his sister has stents.    Significant findings: EKG: Sinus rhythm, 58 bpm, Probable LAE, early repol pattern, has been present in the past       QTC    434 One POC troponin was elevated 0.12, howevere 3 prior POC troponins were 0.00 and one POC troponin after the elevation ws 0.00. There were then 3 negative lab troponins.  Labs were unremarkable except elevated serum glucose, Elevated Alk Phos 197, Elevated AST 62, ketones in urine Normal chest radiographs CT abdomen: No acute or  focal abnormalities to explain the patient's abdominal pain. CTA chest negative for PE, mild coronary artery calcification present.     Past Medical History:  Diagnosis Date  . Benign tumor of endocrine pancreas   . Chronic pancreatitis ([place])    S/P Whipple  . Hypertension   . Migraine    "@ least once/month" (11/12/2015)  . Pancreatic abnormality    CT  shows mass  . Pneumonia 11/12/2015  . Scoliosis     Past Surgical History:  Procedure Laterality Date  . BACK SURGERY    . EUS N/A 09/21/2013   Procedure: ESOPHAGEAL ENDOSCOPIC ULTRASOUND (EUS) RADIAL;  Surgeon: Brent Beams, MD;  Location: [facility];  Service: Endoscopy;  Laterality: N/A;  . EUS N/A 09/30/2013   Procedure: UPPER ENDOSCOPIC ULTRASOUND (EUS) LINEAR;  Surgeon: Brent Beams, MD;  Location: [facility];  Service: Endoscopy;  Laterality: N/A;  . FINE NEEDLE ASPIRATION N/A 09/21/2013   Procedure: FINE NEEDLE ASPIRATION (FNA) LINEAR;  Surgeon: Brent Beams, MD;  Location: [facility];  Service: Endoscopy;  Laterality: N/A;  . FINGER FRACTURE SURGERY Left 1995   5th digit  . FRACTURE SURGERY    . KNEE ARTHROSCOPY Bilateral 1987-1989   right-left  . LAPAROSCOPY N/A 12/01/2013   Procedure: LAPAROSCOPY DIAGNOSTIC PANCREATICODUODENECTOMY WITH BILIARY AND PANCREATIC STENTS;  Surgeon: Brent Klein, MD;  Location: [facility];  Service: General;  Laterality: N/A;  . LUMBAR Brent Rogers SURGERY  1990's  . WHIPPLE  PROCEDURE  12/01/2013     Home Medications:  Prior to Admission medications   Medication Sig Start Date End Date Taking? Authorizing Provider  acetaminophen (TYLENOL) 500 MG tablet Take 1,000 mg by mouth every 6 (six) hours as needed for moderate pain or headache.   Yes [provider]  amLODipine (NORVASC) 5 MG tablet Take 1.5 tablets (7.5 mg total) by mouth daily. 08/27/17  Yes Brent Pier, MD  bismuth subsalicylate (PEPTO BISMOL) 262 MG/15ML suspension Take 30 mLs by mouth every 6 (six) hours as needed  for indigestion.   Yes [provider]  Pancrelipase, Lip-Prot-Amyl, 24000-76000 units CPEP 24,000 units TID with meals 11/26/17  Yes Brent Pier, MD  pantoprazole (PROTONIX) 40 MG tablet Take 1 tablet (40 mg total) by mouth daily. 05/01/17  Yes Brent Pier, MD  traMADol (ULTRAM) 50 MG tablet Take 1 tablet (50 mg total) by mouth 2 (two) times daily as needed. Patient taking differently: Take 50 mg by mouth 2 (two) times daily as needed for moderate pain.  11/26/17  Yes Brent Pier, MD  traZODone (DESYREL) 50 MG tablet Take 0.5 tablets (25 mg total) by mouth at bedtime. 07/09/17  Yes Brent Sells, MD    Inpatient Medications: Scheduled Meds: . amLODipine  7.5 mg Oral Daily  . enoxaparin (LOVENOX) injection  40 mg Subcutaneous Q24H  . lipase/protease/amylase  24,000 Units Oral TID WC  . pantoprazole  40 mg Oral Daily  . traZODone  25 mg Oral QHS   Continuous Infusions:  PRN Meds: acetaminophen, bismuth subsalicylate, morphine injection, nitroGLYCERIN, ondansetron (ZOFRAN) IV  Allergies:   No Known Allergies  Social History:   Social History   Socioeconomic History  . Marital status: Divorced    Spouse name: Not on file  . Number of children: Not on file  . Years of education: Not on file  . Highest education level: Not on file  Social Needs  . Financial resource strain: Not on file  . Food insecurity - worry: Not on file  . Food insecurity - inability: Not on file  . Transportation needs - medical: Not on file  . Transportation needs - non-medical: Not on file  Occupational History  . Not on file  Tobacco Use  . Smoking status: Current Every Day Smoker    Packs/day: 0.25    Years: 30.00    Pack years: 7.50    Types: Cigarettes  . Smokeless tobacco: Former Systems developer    Types: Snuff  . Tobacco comment: "used snuff 10-15 years in my 20s-30s"  Substance and Sexual Activity  . Alcohol use: Yes    Alcohol/week: 2.4 oz    Types: 4 Cans of beer  per week    Comment: 05/01/16  . Drug use: No  . Sexual activity: Not Currently    Partners: Male  Other Topics Concern  . Not on file  Social History Narrative  . Not on file    Family History:    Family History  Problem Relation Age of Onset  . Cancer Mother        unsure  . COPD Mother   . Hypertension Mother   . Hypertension Father   . COPD Father   . CAD Sister        possible has stent per pt     ROS:  Please see the history of present illness.   All other ROS reviewed and negative.     Physical Exam/Data:   Vitals:  12/02/17 0230 12/02/17 0300 12/02/17 0330 12/02/17 0400  BP: (!) 149/102 (!) 149/98 (!) 159/110 (!) 153/104  Pulse: 60 (!) 54 (!) 54 (!) 55  Resp: '12 12 11 14  '$ Temp:      TempSrc:      SpO2: 100% 97% 98% 99%  Weight:      Height:        Intake/Output Summary (Last 24 hours) at 12/02/2017 0857 Last data filed at 12/02/2017 0230 Gross per 24 hour  Intake 2000 ml  Output -  Net 2000 ml   Filed Weights   12/01/17 0903  Weight: 151 lb (68.5 kg)   Body mass index is 18.38 kg/m.  General:  Well nourished, well developed, in no acute distress HEENT: normal Lymph: no adenopathy Neck: no JVD Endocrine:  No thryomegaly Vascular: No carotid bruits; FA pulses 2+ bilaterally without bruits  Cardiac:  normal S1, S2; RRR; no murmur  Lungs:  clear to auscultation bilaterally, no wheezing, rhonchi or rales  Abd: tenderness of LUQ to left flank, point tenderness to left chest Ext: no edema Musculoskeletal:  No deformities, BUE and BLE strength normal and equal Skin: warm and dry  Neuro:  CNs 2-12 intact, no focal abnormalities noted Psych:  Normal affect   EKG:  The EKG was personally reviewed and demonstrates:  Sinus rhythm, 58 bpm, Probable LAE, early repol pattern, has been present in the past       QTC    434 Telemetry:  Telemetry was personally reviewed and demonstrates:  Sinus rhythm in the 60's  Relevant CV Studies: As above  Laboratory  Data:  Chemistry Recent Labs  Lab 12/01/17 0904  NA 141  K 4.5  CL 103  CO2 25  GLUCOSE 120*  BUN 5*  CREATININE 0.93  CALCIUM 9.0  GFRNONAA >60  GFRAA >60  ANIONGAP 13    Recent Labs  Lab 12/01/17 0904  PROT 7.6  ALBUMIN 4.1  AST 62*  ALT 29  ALKPHOS 197*  BILITOT 0.7   Hematology Recent Labs  Lab 12/01/17 0904  WBC 5.8  RBC 5.53  HGB 15.5  HCT 45.8  MCV 82.8  MCH 28.0  MCHC 33.8  RDW 14.9  PLT 302   Cardiac Enzymes Recent Labs  Lab 12/01/17 2035 12/02/17 0305 12/02/17 0509  TROPONINI <0.03 <0.03 <0.03    Recent Labs  Lab 12/01/17 0918 12/01/17 1315 12/01/17 1627  TROPIPOC 0.00 0.12* 0.00    BNPNo results for input(s): BNP, PROBNP in the last 168 hours.  DDimer No results for input(s): DDIMER in the last 168 hours.  Radiology/Studies:  Dg Chest 2 View  Result Date: 12/01/2017 CLINICAL DATA:  Chest pain, back pain, history of pancreatitis EXAM: CHEST  2 VIEW COMPARISON:  03/19/2017 FINDINGS: Lungs are clear.  No pleural effusion or pneumothorax. The heart is normal in size. Visualized osseous structures are within normal limits. IMPRESSION: Normal chest radiographs. Electronically Signed   By: Julian Hy M.D.   On: 12/01/2017 09:40   Ct Angio Chest Pe W And/or Wo Contrast  Result Date: 12/01/2017 CLINICAL DATA:  52 y/o  M; chest pain and shortness of breath. EXAM: CT ANGIOGRAPHY CHEST WITH CONTRAST TECHNIQUE: Multidetector CT imaging of the chest was performed using the standard protocol during bolus administration of intravenous contrast. Multiplanar CT image reconstructions and MIPs were obtained to evaluate the vascular anatomy. CONTRAST:  161m ISOVUE-370 IOPAMIDOL (ISOVUE-370) INJECTION 76% COMPARISON:  11/12/2015 CT chest with contrast. FINDINGS: Cardiovascular: Satisfactory opacification of  the pulmonary arteries to the segmental level. No evidence of pulmonary embolism. Normal heart size. No pericardial effusion. Mild coronary artery  calcification. Mediastinum/Nodes: No enlarged mediastinal, hilar, or axillary lymph nodes. Thyroid gland, trachea, and esophagus demonstrate no significant findings. Lungs/Pleura: Mild paraseptal emphysema. No consolidation, effusion, or pneumothorax. Few tiny perifissural nodules compatible with intrapulmonary lymph nodes. Upper Abdomen: Mild pneumobilia, likely related to history of prior Whipple procedure. Musculoskeletal: No chest wall abnormality. No acute or significant osseous findings. Review of the MIP images confirms the above findings. IMPRESSION: 1. No pulmonary embolus identified. 2. Mild paraseptal emphysema. 3. Mild coronary artery calcification. 4. No acute pulmonary process. Electronically Signed   By: Kristine Garbe M.D.   On: 12/01/2017 14:54   Ct Abdomen Pelvis W Contrast  Result Date: 12/01/2017 CLINICAL DATA:  Left-sided abdominal pain for 3 days.  Diarrhea. EXAM: CT ABDOMEN AND PELVIS WITH CONTRAST TECHNIQUE: Multidetector CT imaging of the abdomen and pelvis was performed using the standard protocol following bolus administration of intravenous contrast. CONTRAST:  176m ISOVUE-300 IOPAMIDOL (ISOVUE-300) INJECTION 61% COMPARISON:  CT abdomen pelvis 07/07/2017. FINDINGS: Lower chest: Linear atelectasis is present at the right lung base. The lung bases are otherwise clear. The heart size is normal. No significant pleural or pericardial effusion is present. Hepatobiliary: Gas is present within the intrahepatic biliary ducts of the left lobe. Edema is improved. No focal hepatic lesions are present. Cholecystectomy is present. The common bile duct is within normal limits following Whipple procedure. Pancreas: Whipple procedure is noted. The residual pancreas is unremarkable. Spleen: Normal in size without focal abnormality. Adrenals/Urinary Tract: Adrenal glands are normal bilaterally. Kidneys and ureters are within normal limits. The urinary bladder is unremarkable. Stomach/Bowel:  Stomach and duodenum are within normal limits. Small bowel is unremarkable. The terminal ileum is within normal limits. The ascending and transverse colon are normal. The descending and sigmoid colon are normal. No obstruction or inflammatory changes are evident. Vascular/Lymphatic: Atherosclerotic calcifications are present in the distal aorta and branch vessels without aneurysm. No significant adenopathy is present. Reproductive: Prostate is unremarkable. Other: No abdominal wall hernia or abnormality. No abdominopelvic ascites. Musculoskeletal: Multilevel degenerate changes in the lumbar spine are again noted. Vertebral body heights are maintained. Alignment is anatomic. A vacuum disc is present at L5-S1. Disc disease and narrowing is present at L3-4, L4-5, and L5-S1. Bony pelvis is intact. The hips are located and within normal limits bilaterally. IMPRESSION: 1. No acute or focal abnormalities to explain the patient's abdominal pain. 2. Sequela of Whipple procedure are stable. 3. Degenerative changes in the lower lumbar spine. Electronically Signed   By: CSan MorelleM.D.   On: 12/01/2017 12:34    Assessment and Plan:   Chest pain -Very atypical, has point tenderness to palpitation. Started with constant vomiting, worse with deep breathing and movement. One troponin was elevated, but all others before and after were negative, likely an erroneous reading. EKG without ischemic changes. This is not consistent with Myocardial ischemia. May be related to the vomiting, musculoskeletal or esophageal. No further cardiac evaluation needed at this time. If he notes chest pain in the future that is unrelated to vomiting he can call our office to arrange for evaluation.   Hypertension: BP currently elevated. He reports prior good control with his home meds.    For questions or updates, please contact [place] consult [email or web address] for contact info under Cardiology/STEMI.   Signed,Daune Perch NP  12/02/2017 8:57 AM   Attending Note:  The patient was seen and examined.  Agree with assessment and plan as noted above.  Changes made to the above note as needed.  Patient seen and independently examined with Pecolia Ades, NP .   We discussed all aspects of the encounter. I agree with the assessment and plan as stated above.  1.  Chest discomfort: Patient presents with very atypical episodes of chest pain.  He is had several days of severe nausea and vomiting and the pain seems to be associated with the active vomiting.  In fact the pain worsens acutely when he vomits.  It also worsens with twisting and turning of his torso and also hurts with palpation.  He has not had any symptoms that are consistent with angina.  His troponin levels are negative.  1 troponin level of 0.12 clearly is a lab error as all of the troponins before and after that level are 0.  EKG shows early repolarization but no acute ST or T wave changes.  2.  Essential hypertension: His blood pressure remains very well controlled.  Continue current medications.   He appears to be very stable.  He does not need any further evaluation.  We will sign off.  Please call for questions.   I have spent a total of 40 minutes with patient reviewing hospital  notes , telemetry, EKGs, labs and examining patient as well as establishing an assessment and plan that was discussed with the patient. > 50% of time was spent in direct patient care.    Thayer Headings, Brooke Bonito., MD, [facility] 12/02/2017, 9:48 AM [address]  [place] Pager [phone]

## 2017-12-02 NOTE — ED Notes (Addendum)
Floor nurse refused pt. Charge and Brent Drought, MD made aware. Bed placement made aware.

## 2017-12-02 NOTE — ED Notes (Signed)
Pt. Found vomiting large amounts. Pt states its from severe pain. Alcario Drought, MD said to give 2 mg of morphine to see an improve in pain. Will continue to monitor.

## 2017-12-02 NOTE — ED Notes (Signed)
Attempted report 

## 2017-12-02 NOTE — Progress Notes (Signed)
Initial Nutrition Assessment  DOCUMENTATION CODES:   Underweight, Severe malnutrition in context of chronic illness  INTERVENTION:   -Boost Breeze po TID, each supplement provides 250 kcal and 9 grams of protein  NUTRITION DIAGNOSIS:   Severe Malnutrition related to chronic illness(pancreatitis) as evidenced by severe muscle depletion, severe fat depletion.  GOAL:   Patient will meet greater than or equal to 90% of their needs  MONITOR:   PO intake, Supplement acceptance, Labs, Weight trends, Diet advancement  REASON FOR ASSESSMENT:   Malnutrition Screening Tool    ASSESSMENT:   Brent Rogers is a 52 y.o. 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  Patient presents to the ED today with chest pain.  Pain is located in L chest, radiates to back.  Is not at all like his chronic abd pain (which he says isnt doing too bad today).  Pt admitted with chest pain.  Case discussed with RN, who reports pt had nausea earlier today and was downgraded to a full liquid diet. Plan to advanced to regular diet if tolerates lunch today; plan to discharge home if able to tolerate solid foods.   Spoke with pt at bedside, who reports good appetite. He consumed all of his full liquid lunch without difficulty. Per pt, he usually consumes 3-4 meals per day (Breakfast: eggs and toast, Lunch: sandwich and ramen noodles, Dinner: similar to lunch). He does not like Ensure or Boost products, but amenable to have Boost Breeze while hospitalized.   Pt reports significant wt loss after whipple procedure in 2015. He shares UBW is around 180# and lost about 50# after procedure. Since then, he reports inability to regain lost weight despite his best efforts. Discussed ways to increase calories and protein in his diet. Pt is very eager to go home and amenable to diet advancement.   Medications reviewed and include creon.  Labs  reviewed: CBGS: 115.   NUTRITION - FOCUSED PHYSICAL EXAM:    Most Recent Value  Orbital Region  No depletion  Upper Arm Region  Severe depletion  Thoracic and Lumbar Region  Severe depletion  Buccal Region  No depletion  Temple Region  No depletion  Clavicle Bone Region  Severe depletion  Clavicle and Acromion Bone Region  Moderate depletion  Scapular Bone Region  Severe depletion  Dorsal Hand  Moderate depletion  Patellar Region  Severe depletion  Anterior Thigh Region  Severe depletion  Posterior Calf Region  Severe depletion  Edema (RD Assessment)  None  Hair  Reviewed  Eyes  Reviewed  Mouth  Reviewed  Skin  Reviewed  Nails  Reviewed       Diet Order:  Diet regular Room service appropriate? Yes; Fluid consistency: Thin  EDUCATION NEEDS:   Education needs have been addressed  Skin:  Skin Assessment: Reviewed RN Assessment  Last BM:  PTA  Height:   Ht Readings from Last 1 Encounters:  12/01/17 6\' 4"  (1.93 m)    Weight:   Wt Readings from Last 1 Encounters:  12/01/17 151 lb (68.5 kg)    Ideal Body Weight:  91.8 kg  BMI:  Body mass index is 18.38 kg/m.  Estimated Nutritional Needs:   Kcal:  5093-26712  Protein:  105-120 grams  Fluid:  2.0-2.2 L    Melyna Huron A. Jimmye Norman, RD, LDN, CDE Pager: [phone] After hours Pager: [phone]

## 2017-12-02 NOTE — Progress Notes (Signed)
Report received from ED RN, pt having active chest pain with pain scale 8/10 per ED RN after giving Morphine 2mg  IV. Pt not appropriate for receiving unit.

## 2017-12-02 NOTE — Progress Notes (Signed)
Discharge note. Patient educated on follow up appointments, diet recommendations, medications and when to take them, signs and symptoms to look for, when to call the MD, and when to seek immediate medical attention. PIV removed without complications.  Patient refused wheelchair. Walked out with NT.

## 2017-12-02 NOTE — Progress Notes (Signed)
PROGRESS NOTE    Brent Rogers  ZYS:[id number] DOB: November 15, 1965 DOA: 12/01/2017 PCP: Ladell Pier, MD  Brief Narrative: Brent Rogers is a 52 y.o. 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  Patient presented to the ED with chest pain.  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 -Unfortunately also drinks EtOH intermittently -Continue pancreatic enzymes, supportive care as listed above  Tobacco abuse  -Counseled   DVT prophylaxis: Lovenox  Code Status:  full code  Family Communication: no family at bedside  Disposition Plan: Home when able to tolerate diet   Consultants:   Cards   Procedures:   Antimicrobials:    Subjective: -had intermittent chest pain and nausea, Vomited x1  Objective: Vitals:   12/02/17 0300 12/02/17 0330 12/02/17 0400 12/02/17 1056  BP: (!) 149/98 (!) 159/110 (!) 153/104 (!) 151/102  Pulse: (!) 54 (!) 54 (!) 55 (!) 58  Resp: 12 11 14 13   Temp:    97.9 F (36.6 C)  TempSrc:    Oral  SpO2: 97% 98% 99% 97%  Weight:      Height:        Intake/Output Summary (Last 24 hours) at 12/02/2017 1234 Last data filed at 12/02/2017 0900 Gross per 24 hour  Intake 1000 ml  Output 400 ml  Net 600 ml   Filed Weights   12/01/17 0903  Weight: 68.5 kg (151 lb)    Examination:  General exam:thinly built male, laying in bed, no distress  Respiratory system: Clear to auscultation. Respiratory effort normal. Cardiovascular system: S1 & S2 heard, RRR. No JVD, murmurs, rubs,  gallops Gastrointestinal system: Abdomen is nondistended, soft and  Mild epigastric tenderness.Normal bowel sounds heard. Central nervous system: Alert and oriented. No focal neurological deficits. Extremities: Symmetric 5 x 5 power. Skin: No rashes, lesions or ulcers Psychiatry: Judgement and insight appear normal. Mood & affect appropriate.     Data Reviewed:   CBC: Recent Labs  Lab 12/01/17 0904  WBC 5.8  HGB 15.5  HCT 45.8  MCV 82.8  PLT 932   Basic Metabolic Panel: Recent Labs  Lab 12/01/17 0904  NA 141  K 4.5  CL 103  CO2 25  GLUCOSE 120*  BUN 5*  CREATININE 0.93  CALCIUM 9.0   GFR: Estimated Creatinine Clearance: 90 mL/min (by C-G formula based on SCr of 0.93 mg/dL). Liver Function Tests: Recent Labs  Lab 12/01/17 0904  AST 62*  ALT 29  ALKPHOS 197*  BILITOT 0.7  PROT 7.6  ALBUMIN 4.1   Recent Labs  Lab 12/01/17 0904  LIPASE 17   No results for input(s): AMMONIA in the last 168 hours. Coagulation Profile: No results for input(s): INR, PROTIME in the last 168 hours. Cardiac Enzymes: Recent Labs  Lab 12/01/17 2035 12/02/17 0305 12/02/17 0509  TROPONINI <0.03 <0.03 <0.03   BNP (last 3 results) No results for input(s): PROBNP in the last 8760 hours. HbA1C: No results for input(s): HGBA1C in the last 72 hours. CBG: Recent Labs  Lab 12/02/17 0410  GLUCAP 115*  Lipid Profile: No results for input(s): CHOL, HDL, LDLCALC, TRIG, CHOLHDL, LDLDIRECT in the last 72 hours. Thyroid Function Tests: No results for input(s): TSH, T4TOTAL, FREET4, T3FREE, THYROIDAB in the last 72 hours. Anemia Panel: No results for input(s): VITAMINB12, FOLATE, FERRITIN, TIBC, IRON, RETICCTPCT in the last 72 hours. Urine analysis:    Component Value Date/Time   COLORURINE STRAW (A) 12/01/2017 1238   APPEARANCEUR CLEAR 12/01/2017 1238   LABSPEC 1.004 (L) 12/01/2017 1238   PHURINE 5.0 12/01/2017 1238   GLUCOSEU NEGATIVE 12/01/2017 1238   HGBUR NEGATIVE  12/01/2017 1238   BILIRUBINUR NEGATIVE 12/01/2017 1238   KETONESUR 5 (A) 12/01/2017 1238   PROTEINUR NEGATIVE 12/01/2017 1238   UROBILINOGEN 1.0 06/23/2015 1513   NITRITE NEGATIVE 12/01/2017 1238   LEUKOCYTESUR NEGATIVE 12/01/2017 1238   Sepsis Labs: @LABRCNTIP (procalcitonin:4,lacticidven:4)  ) Recent Results (from the past 240 hour(s))  MRSA PCR Screening     Status: None   Collection Time: 12/02/17  6:53 AM  Result Value Ref Range Status   MRSA by PCR NEGATIVE NEGATIVE Final    Comment:        The GeneXpert MRSA Assay (FDA approved for NASAL specimens only), is one component of a comprehensive MRSA colonization surveillance program. It is not intended to diagnose MRSA infection nor to guide or monitor treatment for MRSA infections. Performed at [facility], [place] [address] [place], [place] [address]          Radiology Studies: Dg Chest 2 View  Result Date: 12/01/2017 CLINICAL DATA:  Chest pain, back pain, history of pancreatitis EXAM: CHEST  2 VIEW COMPARISON:  03/19/2017 FINDINGS: Lungs are clear.  No pleural effusion or pneumothorax. The heart is normal in size. Visualized osseous structures are within normal limits. IMPRESSION: Normal chest radiographs. Electronically Signed   By: Julian Hy M.D.   On: 12/01/2017 09:40   Ct Angio Chest Pe W And/or Wo Contrast  Result Date: 12/01/2017 CLINICAL DATA:  52 y/o  M; chest pain and shortness of breath. EXAM: CT ANGIOGRAPHY CHEST WITH CONTRAST TECHNIQUE: Multidetector CT imaging of the chest was performed using the standard protocol during bolus administration of intravenous contrast. Multiplanar CT image reconstructions and MIPs were obtained to evaluate the vascular anatomy. CONTRAST:  126mL ISOVUE-370 IOPAMIDOL (ISOVUE-370) INJECTION 76% COMPARISON:  11/12/2015 CT chest with contrast. FINDINGS: Cardiovascular: Satisfactory opacification of the pulmonary arteries to the segmental level. No evidence of pulmonary  embolism. Normal heart size. No pericardial effusion. Mild coronary artery calcification. Mediastinum/Nodes: No enlarged mediastinal, hilar, or axillary lymph nodes. Thyroid gland, trachea, and esophagus demonstrate no significant findings. Lungs/Pleura: Mild paraseptal emphysema. No consolidation, effusion, or pneumothorax. Few tiny perifissural nodules compatible with intrapulmonary lymph nodes. Upper Abdomen: Mild pneumobilia, likely related to history of prior Whipple procedure. Musculoskeletal: No chest wall abnormality. No acute or significant osseous findings. Review of the MIP images confirms the above findings. IMPRESSION: 1. No pulmonary embolus identified. 2. Mild paraseptal emphysema. 3. Mild coronary artery calcification. 4. No acute pulmonary process. Electronically Signed   By: Kristine Garbe M.D.   On: 12/01/2017 14:54   Ct Abdomen Pelvis W Contrast  Result Date: 12/01/2017 CLINICAL DATA:  Left-sided abdominal pain for 3 days.  Diarrhea. EXAM: CT ABDOMEN AND PELVIS WITH CONTRAST TECHNIQUE: Multidetector CT imaging of the abdomen and pelvis was performed using the standard protocol following bolus administration of intravenous contrast. CONTRAST:  13mL ISOVUE-300 IOPAMIDOL (ISOVUE-300) INJECTION 61% COMPARISON:  CT abdomen pelvis 07/07/2017. FINDINGS: Lower chest: Linear atelectasis is  present at the right lung base. The lung bases are otherwise clear. The heart size is normal. No significant pleural or pericardial effusion is present. Hepatobiliary: Gas is present within the intrahepatic biliary ducts of the left lobe. Edema is improved. No focal hepatic lesions are present. Cholecystectomy is present. The common bile duct is within normal limits following Whipple procedure. Pancreas: Whipple procedure is noted. The residual pancreas is unremarkable. Spleen: Normal in size without focal abnormality. Adrenals/Urinary Tract: Adrenal glands are normal bilaterally. Kidneys and ureters are  within normal limits. The urinary bladder is unremarkable. Stomach/Bowel: Stomach and duodenum are within normal limits. Small bowel is unremarkable. The terminal ileum is within normal limits. The ascending and transverse colon are normal. The descending and sigmoid colon are normal. No obstruction or inflammatory changes are evident. Vascular/Lymphatic: Atherosclerotic calcifications are present in the distal aorta and branch vessels without aneurysm. No significant adenopathy is present. Reproductive: Prostate is unremarkable. Other: No abdominal wall hernia or abnormality. No abdominopelvic ascites. Musculoskeletal: Multilevel degenerate changes in the lumbar spine are again noted. Vertebral body heights are maintained. Alignment is anatomic. A vacuum disc is present at L5-S1. Disc disease and narrowing is present at L3-4, L4-5, and L5-S1. Bony pelvis is intact. The hips are located and within normal limits bilaterally. IMPRESSION: 1. No acute or focal abnormalities to explain the patient's abdominal pain. 2. Sequela of Whipple procedure are stable. 3. Degenerative changes in the lower lumbar spine. Electronically Signed   By: San Morelle M.D.   On: 12/01/2017 12:34        Scheduled Meds: . amLODipine  7.5 mg Oral Daily  . enoxaparin (LOVENOX) injection  40 mg Subcutaneous Q24H  . lipase/protease/amylase  24,000 Units Oral TID WC  . pantoprazole  40 mg Oral Daily  . traZODone  25 mg Oral QHS   Continuous Infusions:   LOS: 0 days    Time spent: 11min    Domenic Polite, MD [facility] Page via [email or web address], password [email or web address] After 7PM please contact night-coverage  12/02/2017, 12:34 PM

## 2017-12-07 NOTE — Discharge Summary (Signed)
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 Patient presented to the ED with chest pain. 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 -Unfortunately also drinks EtOH intermittently -Continue pancreatic enzymes, supportive care as listed above -able to tolerate diet today and discharged home to FU with PCP  Tobacco abuse  -Counseled     Discharge Exam: Vitals:   12/02/17 0400 12/02/17 1056  BP: (!) 153/104 (!) 151/102  Pulse: (!) 55 (!) 58  Resp: 14 13  Temp:  97.9 F (36.6 C)  SpO2: 99% 97%    General: AAOx3 Cardiovascular: S1S2/RRR Respiratory: CTAB  Discharge Instructions   Discharge Instructions    Diet - low sodium heart healthy   Complete by:  As directed    Discharge instructions   Complete by:  As directed    Soft bland diet, advance as tolerated   Increase activity slowly   Complete by:  As directed      Allergies as of 12/02/2017   No Known  Allergies     Medication List    TAKE these medications   acetaminophen 500 MG tablet Commonly known as:  TYLENOL Take 1,000 mg by mouth every 6 (six) hours as needed for moderate pain or headache.   amLODipine 5 MG tablet Commonly known as:  NORVASC Take 1.5 tablets (7.5 mg total) by mouth daily.   bismuth subsalicylate 765 YY/50PT suspension Commonly known as:  PEPTO BISMOL Take 30 mLs by mouth every 6 (six) hours as needed for indigestion.   Pancrelipase (Lip-Prot-Amyl) 24000-76000 units Cpep 24,000 units TID with meals   pantoprazole 40 MG tablet Commonly known as:  PROTONIX Take 1 tablet (40 mg total) by mouth daily.   traMADol 50 MG tablet Commonly known as:  ULTRAM Take 1 tablet (50 mg total) by mouth 2 (two) times daily as needed. What changed:  reasons to take this   traZODone 50 MG tablet Commonly known as:  DESYREL Take 0.5 tablets (25 mg total) by mouth at bedtime.      No Known Allergies Follow-up Information    Ladell Pier, MD. Schedule an appointment as soon as possible for a visit in 1 week(s).   Specialty:  Internal Medicine Contact information: [place] [address] [phone]            The results of significant diagnostics from this hospitalization (including imaging, microbiology, ancillary and laboratory) are listed below for reference.    Significant Diagnostic Studies: Dg Chest 2 View  Result Date: 12/01/2017 CLINICAL  DATA:  Chest pain, back pain, history of pancreatitis EXAM: CHEST  2 VIEW COMPARISON:  03/19/2017 FINDINGS: Lungs are clear.  No pleural effusion or pneumothorax. The heart is normal in size. Visualized osseous structures are within normal limits. IMPRESSION: Normal chest radiographs. Electronically Signed   By: Julian Hy M.D.   On: 12/01/2017 09:40   Ct Angio Chest Pe W And/or Wo Contrast  Result Date: 12/01/2017 CLINICAL DATA:  52 y/o  M; chest pain and shortness of breath. EXAM: CT  ANGIOGRAPHY CHEST WITH CONTRAST TECHNIQUE: Multidetector CT imaging of the chest was performed using the standard protocol during bolus administration of intravenous contrast. Multiplanar CT image reconstructions and MIPs were obtained to evaluate the vascular anatomy. CONTRAST:  128mL ISOVUE-370 IOPAMIDOL (ISOVUE-370) INJECTION 76% COMPARISON:  11/12/2015 CT chest with contrast. FINDINGS: Cardiovascular: Satisfactory opacification of the pulmonary arteries to the segmental level. No evidence of pulmonary embolism. Normal heart size. No pericardial effusion. Mild coronary artery calcification. Mediastinum/Nodes: No enlarged mediastinal, hilar, or axillary lymph nodes. Thyroid gland, trachea, and esophagus demonstrate no significant findings. Lungs/Pleura: Mild paraseptal emphysema. No consolidation, effusion, or pneumothorax. Few tiny perifissural nodules compatible with intrapulmonary lymph nodes. Upper Abdomen: Mild pneumobilia, likely related to history of prior Whipple procedure. Musculoskeletal: No chest wall abnormality. No acute or significant osseous findings. Review of the MIP images confirms the above findings. IMPRESSION: 1. No pulmonary embolus identified. 2. Mild paraseptal emphysema. 3. Mild coronary artery calcification. 4. No acute pulmonary process. Electronically Signed   By: Kristine Garbe M.D.   On: 12/01/2017 14:54   Ct Abdomen Pelvis W Contrast  Result Date: 12/01/2017 CLINICAL DATA:  Left-sided abdominal pain for 3 days.  Diarrhea. EXAM: CT ABDOMEN AND PELVIS WITH CONTRAST TECHNIQUE: Multidetector CT imaging of the abdomen and pelvis was performed using the standard protocol following bolus administration of intravenous contrast. CONTRAST:  154mL ISOVUE-300 IOPAMIDOL (ISOVUE-300) INJECTION 61% COMPARISON:  CT abdomen pelvis 07/07/2017. FINDINGS: Lower chest: Linear atelectasis is present at the right lung base. The lung bases are otherwise clear. The heart size is normal. No  significant pleural or pericardial effusion is present. Hepatobiliary: Gas is present within the intrahepatic biliary ducts of the left lobe. Edema is improved. No focal hepatic lesions are present. Cholecystectomy is present. The common bile duct is within normal limits following Whipple procedure. Pancreas: Whipple procedure is noted. The residual pancreas is unremarkable. Spleen: Normal in size without focal abnormality. Adrenals/Urinary Tract: Adrenal glands are normal bilaterally. Kidneys and ureters are within normal limits. The urinary bladder is unremarkable. Stomach/Bowel: Stomach and duodenum are within normal limits. Small bowel is unremarkable. The terminal ileum is within normal limits. The ascending and transverse colon are normal. The descending and sigmoid colon are normal. No obstruction or inflammatory changes are evident. Vascular/Lymphatic: Atherosclerotic calcifications are present in the distal aorta and branch vessels without aneurysm. No significant adenopathy is present. Reproductive: Prostate is unremarkable. Other: No abdominal wall hernia or abnormality. No abdominopelvic ascites. Musculoskeletal: Multilevel degenerate changes in the lumbar spine are again noted. Vertebral body heights are maintained. Alignment is anatomic. A vacuum disc is present at L5-S1. Disc disease and narrowing is present at L3-4, L4-5, and L5-S1. Bony pelvis is intact. The hips are located and within normal limits bilaterally. IMPRESSION: 1. No acute or focal abnormalities to explain the patient's abdominal pain. 2. Sequela of Whipple procedure are stable. 3. Degenerative changes in the lower lumbar spine. Electronically Signed   By: San Morelle M.D.   On: 12/01/2017  12:34    Microbiology: Recent Results (from the past 240 hour(s))  MRSA PCR Screening     Status: None   Collection Time: 12/02/17  6:53 AM  Result Value Ref Range Status   MRSA by PCR NEGATIVE NEGATIVE Final    Comment:        The  GeneXpert MRSA Assay (FDA approved for NASAL specimens only), is one component of a comprehensive MRSA colonization surveillance program. It is not intended to diagnose MRSA infection nor to guide or monitor treatment for MRSA infections. Performed at [facility], [place] [address], [place], [place] [address]      Labs: Basic Metabolic Panel: Recent Labs  Lab 12/01/17 0904  NA 141  K 4.5  CL 103  CO2 25  GLUCOSE 120*  BUN 5*  CREATININE 0.93  CALCIUM 9.0   Liver Function Tests: Recent Labs  Lab 12/01/17 0904  AST 62*  ALT 29  ALKPHOS 197*  BILITOT 0.7  PROT 7.6  ALBUMIN 4.1   Recent Labs  Lab 12/01/17 0904  LIPASE 17   No results for input(s): AMMONIA in the last 168 hours. CBC: Recent Labs  Lab 12/01/17 0904  WBC 5.8  HGB 15.5  HCT 45.8  MCV 82.8  PLT 302   Cardiac Enzymes: Recent Labs  Lab 12/01/17 2035 12/02/17 0305 12/02/17 0509  TROPONINI <0.03 <0.03 <0.03   BNP: BNP (last 3 results) No results for input(s): BNP in the last 8760 hours.  ProBNP (last 3 results) No results for input(s): PROBNP in the last 8760 hours.  CBG: Recent Labs  Lab 12/02/17 0410  GLUCAP 115*       Signed:  Domenic Polite MD.  [facility] 12/07/2017, 2:18 PM

## 2017-12-14 MED FILL — AMLODIPINE BESYLATE 5 MG TA: 5 | 30 days supply | Qty: 45 | Fill #1

## 2017-12-14 MED FILL — traZODone HCL 50 MG TABS: 50 | 30 days supply | Qty: 15 | Fill #2

## 2017-12-14 MED FILL — PROMETHAZINE 25 MG TABLET: 25 | 6 days supply | Qty: 20 | Fill #1

## 2017-12-16 ENCOUNTER — Ambulatory Visit: Payer: Self-pay | Attending: Internal Medicine

## 2018-01-29 ENCOUNTER — Other Ambulatory Visit: Payer: Self-pay | Admitting: Internal Medicine

## 2018-01-29 DIAGNOSIS — K859 Acute pancreatitis without necrosis or infection, unspecified: Secondary | ICD-10-CM

## 2018-01-29 MED FILL — traZODone HCL 50 MG TABS: 50 | 30 days supply | Qty: 15 | Fill #3

## 2018-02-01 DIAGNOSIS — I10 Essential (primary) hypertension: Secondary | ICD-10-CM

## 2018-02-01 MED ORDER — PROMETHAZINE HCL 25 MG PO TABS: ORAL_TABLET | ORAL | 1 refills | Status: DC

## 2018-02-01 MED FILL — PROMETHAZINE 25 MG TABLET: 25 | 6 days supply | Qty: 20 | Fill #0

## 2018-02-01 NOTE — Telephone Encounter (Signed)
Pt is aware.  

## 2018-02-03 ENCOUNTER — Other Ambulatory Visit: Payer: Self-pay | Admitting: Internal Medicine

## 2018-02-03 DIAGNOSIS — K859 Acute pancreatitis without necrosis or infection, unspecified: Secondary | ICD-10-CM

## 2018-02-23 ENCOUNTER — Ambulatory Visit: Payer: Self-pay | Attending: Internal Medicine | Admitting: Internal Medicine

## 2018-02-23 ENCOUNTER — Encounter: Payer: Self-pay | Admitting: Internal Medicine

## 2018-02-23 VITALS — BP 119/73 | HR 92 | Temp 98.2°F | Resp 16 | Wt 144.4 lb

## 2018-02-23 DIAGNOSIS — F329 Major depressive disorder, single episode, unspecified: Secondary | ICD-10-CM | POA: Insufficient documentation

## 2018-02-23 DIAGNOSIS — X58XXXA Exposure to other specified factors, initial encounter: Secondary | ICD-10-CM | POA: Insufficient documentation

## 2018-02-23 DIAGNOSIS — F17210 Nicotine dependence, cigarettes, uncomplicated: Secondary | ICD-10-CM | POA: Insufficient documentation

## 2018-02-23 DIAGNOSIS — Z9889 Other specified postprocedural states: Secondary | ICD-10-CM | POA: Insufficient documentation

## 2018-02-23 DIAGNOSIS — K861 Other chronic pancreatitis: Secondary | ICD-10-CM | POA: Insufficient documentation

## 2018-02-23 DIAGNOSIS — I10 Essential (primary) hypertension: Secondary | ICD-10-CM | POA: Insufficient documentation

## 2018-02-23 DIAGNOSIS — Z9049 Acquired absence of other specified parts of digestive tract: Secondary | ICD-10-CM | POA: Insufficient documentation

## 2018-02-23 DIAGNOSIS — S161XXA Strain of muscle, fascia and tendon at neck level, initial encounter: Secondary | ICD-10-CM | POA: Insufficient documentation

## 2018-02-23 DIAGNOSIS — K859 Acute pancreatitis without necrosis or infection, unspecified: Secondary | ICD-10-CM

## 2018-02-23 DIAGNOSIS — F17200 Nicotine dependence, unspecified, uncomplicated: Secondary | ICD-10-CM

## 2018-02-23 DIAGNOSIS — F32A Depression, unspecified: Secondary | ICD-10-CM

## 2018-02-23 DIAGNOSIS — Z79899 Other long term (current) drug therapy: Secondary | ICD-10-CM | POA: Insufficient documentation

## 2018-02-23 DIAGNOSIS — Z8249 Family history of ischemic heart disease and other diseases of the circulatory system: Secondary | ICD-10-CM | POA: Insufficient documentation

## 2018-02-23 DIAGNOSIS — K219 Gastro-esophageal reflux disease without esophagitis: Secondary | ICD-10-CM | POA: Insufficient documentation

## 2018-02-23 MED ORDER — METHOCARBAMOL 500 MG PO TABS: 500.0000 mg | ORAL_TABLET | Freq: Three times a day (TID) | ORAL | 0 refills | Status: DC | PRN

## 2018-02-23 MED ORDER — TRAMADOL HCL 50 MG PO TABS: 50.0000 mg | ORAL_TABLET | Freq: Two times a day (BID) | ORAL | 0 refills | Status: DC | PRN

## 2018-02-23 MED ORDER — BUPROPION HCL ER (SR) 100 MG PO TB12: 100.0000 mg | ORAL_TABLET | Freq: Two times a day (BID) | ORAL | 3 refills | Status: DC

## 2018-02-23 NOTE — Progress Notes (Signed)
Patient ID: Brent Rogers, male    DOB: 1965-10-27  MRN: [id number]  CC: Hypertension   Subjective: Brent Rogers is a 52 y.o. male who presents for chronic ds  His concerns today include:  52 year old male with history of chronic pancreatitiss/p benign pancreatic tumor excision with Whipple procedure,fhx of pancreatic cancer in father,hypertension, GERD, tob dep  Patient complains of having a crick on the right side of his neck x2 weeks.  No initiating factors other than he popped his neck about 2 weeks ago and it has been sore ever since.  No radiation of pain.    Depression: Complains of feeling depressed.  Denied disability 2-3 times.  Just received another denial letter about a week ago. Thinks he may become homeless Not sleeping well Appetite comes and goes.  Loss interest in everything.  Stays to himself a lot. No crying spells.  No SI.  No energy. Smoking more, now 1/3 pk a day.  "I am so bored  and I have so much worries, its the only thing that calms me down."  Chronic Pancreatitis:  Flare about 1 wk ago but I just suffered through it at home.  Taking Creon as prescribed.  Request RF on Tramadol which he takes only as needed.  Reports adequate pain relief with the medication and no significant side effects.  HTN:  Compliant with Norvasc  Patient Active Problem List   Diagnosis Date Noted  . Chest pain 12/01/2017  . Pancreatitis 07/07/2017  . Chronic pancreatitis ([place]) 05/01/2017  . Malnutrition of moderate degree 03/20/2017  . Foot ulcer with fat layer exposed ([place])   . Foot callus 12/25/2015  . Onychomycosis of toenail 12/25/2015  . Fatty liver 11/12/2015  . GERD (gastroesophageal reflux disease) 11/12/2015  . HTN (hypertension) 07/10/2015  . S/P cholecystectomy 07/10/2015  . Tobacco abuse 12/26/2014  . Protein-calorie malnutrition, severe ([place]) 11/20/2014  . Exocrine pancreatic insufficiency ([place]) 12/23/2013     Current Outpatient Medications on File  Prior to Visit  Medication Sig Dispense Refill  . acetaminophen (TYLENOL) 500 MG tablet Take 1,000 mg by mouth every 6 (six) hours as needed for moderate pain or headache.    Marland Kitchen amLODipine (NORVASC) 5 MG tablet Take 1.5 tablets (7.5 mg total) by mouth daily. 45 tablet 11  . bismuth subsalicylate (PEPTO BISMOL) 262 MG/15ML suspension Take 30 mLs by mouth every 6 (six) hours as needed for indigestion.    . Pancrelipase, Lip-Prot-Amyl, 24000-76000 units CPEP 24,000 units TID with meals 270 capsule 3  . pantoprazole (PROTONIX) 40 MG tablet Take 1 tablet (40 mg total) by mouth daily. 30 tablet 5  . promethazine (PHENERGAN) 25 MG tablet TAKE 1 TABLET BY MOUTH EVERY 8 HOURS AS NEEDED FOR NAUSEA OR VOMITING. 20 tablet 1  . traZODone (DESYREL) 50 MG tablet Take 0.5 tablets (25 mg total) by mouth at bedtime. 30 tablet 1   No current facility-administered medications on file prior to visit.     No Known Allergies  Social History   Socioeconomic History  . Marital status: Divorced    Spouse name: Not on file  . Number of children: Not on file  . Years of education: Not on file  . Highest education level: Not on file  Occupational History  . Not on file  Social Needs  . Financial resource strain: Not on file  . Food insecurity:    Worry: Not on file    Inability: Not on file  . Transportation needs:  Medical: Not on file    Non-medical: Not on file  Tobacco Use  . Smoking status: Current Every Day Smoker    Packs/day: 0.25    Years: 30.00    Pack years: 7.50    Types: Cigarettes  . Smokeless tobacco: Former Systems developer    Types: Snuff  . Tobacco comment: "used snuff 10-15 years in my 20s-30s"  Substance and Sexual Activity  . Alcohol use: Yes    Alcohol/week: 2.4 oz    Types: 4 Cans of beer per week    Comment: 05/01/16  . Drug use: No  . Sexual activity: Not Currently    Partners: Male  Lifestyle  . Physical activity:    Days per week: Not on file    Minutes per session: Not on  file  . Stress: Not on file  Relationships  . Social connections:    Talks on phone: Not on file    Gets together: Not on file    Attends religious service: Not on file    Active member of club or organization: Not on file    Attends meetings of clubs or organizations: Not on file    Relationship status: Not on file  . Intimate partner violence:    Fear of current or ex partner: Not on file    Emotionally abused: Not on file    Physically abused: Not on file    Forced sexual activity: Not on file  Other Topics Concern  . Not on file  Social History Narrative  . Not on file    Family History  Problem Relation Age of Onset  . Cancer Mother        unsure  . COPD Mother   . Hypertension Mother   . Hypertension Father   . COPD Father   . CAD Sister        possible has stent per pt    Past Surgical History:  Procedure Laterality Date  . BACK SURGERY    . EUS N/A 09/21/2013   Procedure: ESOPHAGEAL ENDOSCOPIC ULTRASOUND (EUS) RADIAL;  Surgeon: Beryle Beams, MD;  Location: [facility];  Service: Endoscopy;  Laterality: N/A;  . EUS N/A 09/30/2013   Procedure: UPPER ENDOSCOPIC ULTRASOUND (EUS) LINEAR;  Surgeon: Beryle Beams, MD;  Location: [facility];  Service: Endoscopy;  Laterality: N/A;  . FINE NEEDLE ASPIRATION N/A 09/21/2013   Procedure: FINE NEEDLE ASPIRATION (FNA) LINEAR;  Surgeon: Beryle Beams, MD;  Location: [facility];  Service: Endoscopy;  Laterality: N/A;  . FINGER FRACTURE SURGERY Left 1995   5th digit  . FRACTURE SURGERY    . KNEE ARTHROSCOPY Bilateral 1987-1989   right-left  . LAPAROSCOPY N/A 12/01/2013   Procedure: LAPAROSCOPY DIAGNOSTIC PANCREATICODUODENECTOMY WITH BILIARY AND PANCREATIC STENTS;  Surgeon: Stark Klein, MD;  Location: [facility];  Service: General;  Laterality: N/A;  . LUMBAR [place] SURGERY  1990's  . WHIPPLE PROCEDURE  12/01/2013    ROS: Review of Systems Negative except as stated above PHYSICAL EXAM: BP 119/73   Pulse 92   Temp 98.2 F  (36.8 C) (Oral)   Resp 16   Wt 144 lb 6.4 oz (65.5 kg)   SpO2 98%   BMI 17.58 kg/m   Wt Readings from Last 3 Encounters:  02/23/18 144 lb 6.4 oz (65.5 kg)  12/01/17 151 lb (68.5 kg)  11/26/17 151 lb 9.6 oz (68.8 kg)   Physical Exam  General appearance - alert, well appearing, and in no distress Mental status -flat  affect  mouth - mucous membranes moist, pharynx normal without lesions Chest - clear to auscultation, no wheezes, rales or rhonchi, symmetric air entry Heart - normal rate, regular rhythm, normal S1, S2, no murmurs, rubs, clicks or gallops Abdomen: Soft and nontender Musculoskeletal -neck: Mild discomfort with rotation to the right.  Tightness of the trapezius muscle on the right side. Extremities -no lower extremity edema.  Depression screen [facility] 2/9 02/23/2018 11/26/2017 05/01/2017  Decreased Interest 3 2 2   Down, Depressed, Hopeless 3 2 0  PHQ - 2 Score 6 4 2   Altered sleeping 3 3 0  Tired, decreased energy 3 2 3   Change in appetite 3 3 2   Feeling bad or failure about yourself  3 2 0  Trouble concentrating 0 2 0  Moving slowly or fidgety/restless (No Data) 1 0  Suicidal thoughts 3 0 0  PHQ-9 Score 21 17 7     ASSESSMENT AND PLAN: 1. Strain of neck muscle, initial encounter Recommend using heat.  Limited supply of a muscle relaxant also given.  Warned that medication can cause drowsiness. - methocarbamol (ROBAXIN) 500 MG tablet; Take 1 tablet (500 mg total) by mouth every 8 (eight) hours as needed for muscle spasms.  Dispense: 15 tablet; Refill: 0  2. Depression, unspecified depression type -Patient declined meeting with LCSW today.  I recommend considering seeing a therapist through [facility] or family services of the [place].  He feels he would benefit from an antidepressant.  Even though he is not ready to quit smoking I think Wellbutrin will be beneficial in helping the depression and also decreasing cravings for the cigarettes.  His blood pressure is under  adequate control to be placed on Wellbutrin.  Patient advised to follow-up if he has any increased depression or suicidal ideation.  I will see him back in 6 weeks to see how he is doing. - buPROPion (WELLBUTRIN SR) 100 MG 12 hr tablet; Take 1 tablet (100 mg total) by mouth 2 (two) times daily.  Dispense: 60 tablet; Refill: 3  3. Essential hypertension Goal.  Continue amlodipine  4. Tobacco dependence Advised to quit smoking.  Patient not ready to quit.  5. Other chronic pancreatitis ([place]) Patient's controlled substance prescribing agreement was updated today. He reports adequate pain control with med - traMADol (ULTRAM) 50 MG tablet; Take 1 tablet (50 mg total) by mouth 2 (two) times daily as needed.  Dispense: 60 tablet; Refill: 0   Patient was given the opportunity to ask questions.  Patient verbalized understanding of the plan and was able to repeat key elements of the plan.   No orders of the defined types were placed in this encounter.    Requested Prescriptions   Signed Prescriptions Disp Refills  . buPROPion (WELLBUTRIN SR) 100 MG 12 hr tablet 60 tablet 3    Sig: Take 1 tablet (100 mg total) by mouth 2 (two) times daily.  . methocarbamol (ROBAXIN) 500 MG tablet 15 tablet 0    Sig: Take 1 tablet (500 mg total) by mouth every 8 (eight) hours as needed for muscle spasms.  . traMADol (ULTRAM) 50 MG tablet 60 tablet 0    Sig: Take 1 tablet (50 mg total) by mouth 2 (two) times daily as needed.    Return in about 6 weeks (around 04/06/2018) for f/u depression.  Karle Plumber, MD, FACP

## 2018-02-24 ENCOUNTER — Emergency Department (HOSPITAL_COMMUNITY): Payer: Self-pay

## 2018-02-24 ENCOUNTER — Encounter (HOSPITAL_COMMUNITY): Payer: Self-pay | Admitting: Emergency Medicine

## 2018-02-24 ENCOUNTER — Emergency Department (HOSPITAL_COMMUNITY)
Admission: EM | Admit: 2018-02-24 | Discharge: 2018-02-25 | Disposition: A | Discharge status: Home / Self Care | Payer: Self-pay | Attending: Physician Assistant | Admitting: Physician Assistant

## 2018-02-24 DIAGNOSIS — I10 Essential (primary) hypertension: Secondary | ICD-10-CM | POA: Insufficient documentation

## 2018-02-24 DIAGNOSIS — K859 Acute pancreatitis without necrosis or infection, unspecified: Secondary | ICD-10-CM

## 2018-02-24 DIAGNOSIS — F17210 Nicotine dependence, cigarettes, uncomplicated: Secondary | ICD-10-CM | POA: Insufficient documentation

## 2018-02-24 DIAGNOSIS — Z79899 Other long term (current) drug therapy: Secondary | ICD-10-CM | POA: Insufficient documentation

## 2018-02-24 LAB — BASIC METABOLIC PANEL
Anion gap: 11 (ref 5–15)
BUN: <5 mg/dL — ABNORMAL LOW (ref 6–20)
CO2: 25 mmol/L (ref 22–32)
Calcium: 9.4 mg/dL (ref 8.9–10.3)
Chloride: 102 mmol/L (ref 101–111)
Creatinine, Ser: 0.91 mg/dL (ref 0.61–1.24)
GFR calc Af Amer: >60 mL/min (ref >60)
GFR calc non Af Amer: >60 mL/min (ref >60)
Glucose, Bld: 147 mg/dL — ABNORMAL HIGH (ref 65–99)
Potassium: 3.9 mmol/L (ref 3.5–5.1)
Sodium: 138 mmol/L (ref 135–145)

## 2018-02-24 LAB — URINALYSIS, ROUTINE W REFLEX MICROSCOPIC
Glucose, UA: NEGATIVE mg/dL
Hgb urine dipstick: NEGATIVE
Ketones, ur: 20 mg/dL — AB
Leukocytes, UA: NEGATIVE
Nitrite: NEGATIVE
Protein, ur: 30 mg/dL — AB
Specific Gravity, Urine: 1.032 — ABNORMAL HIGH (ref 1.005–1.030)
pH: 5 (ref 5.0–8.0)

## 2018-02-24 LAB — CBC
HCT: 44.3 % (ref 39.0–52.0)
Hemoglobin: 14.2 g/dL (ref 13.0–17.0)
MCH: 26.2 pg (ref 26.0–34.0)
MCHC: 32.1 g/dL (ref 30.0–36.0)
MCV: 81.6 fL (ref 78.0–100.0)
Platelets: 242 10*3/uL (ref 150–400)
RBC: 5.43 MIL/uL (ref 4.22–5.81)
RDW: 18.1 % — ABNORMAL HIGH (ref 11.5–15.5)
WBC: 10.3 10*3/uL (ref 4.0–10.5)

## 2018-02-24 LAB — I-STAT TROPONIN, ED: Troponin i, poc: 0 ng/mL (ref 0.00–0.08)

## 2018-02-24 LAB — LIPASE, BLOOD: Lipase: 26 U/L (ref 11–51)

## 2018-02-24 MED ORDER — OXYCODONE-ACETAMINOPHEN 5-325 MG PO TABS: 1.0000 | ORAL_TABLET | Freq: Four times a day (QID) | ORAL | 0 refills | Status: DC | PRN

## 2018-02-24 MED ORDER — OXYCODONE-ACETAMINOPHEN 5-325 MG PO TABS
1.0000 | ORAL_TABLET | Freq: Once | ORAL | Status: AC
  Administered 2018-02-24: 1 via ORAL
  Filled 2018-02-24: qty 1

## 2018-02-24 MED ORDER — MORPHINE SULFATE (PF) 4 MG/ML IV SOLN
4.0000 mg | Freq: Once | INTRAVENOUS | Status: AC
  Administered 2018-02-24: 4 mg via INTRAVENOUS
  Filled 2018-02-24: qty 1

## 2018-02-24 MED ORDER — ONDANSETRON HCL 4 MG/2ML IJ SOLN
4.0000 mg | Freq: Once | INTRAMUSCULAR | Status: AC
  Administered 2018-02-24: 4 mg via INTRAVENOUS
  Filled 2018-02-24: qty 2

## 2018-02-24 MED ORDER — ONDANSETRON 4 MG PO TBDP: 4.0000 mg | ORAL_TABLET | Freq: Three times a day (TID) | ORAL | 0 refills | Status: DC | PRN

## 2018-02-24 MED ORDER — OXYCODONE-ACETAMINOPHEN 5-325 MG PO TABS
1.0000 | ORAL_TABLET | Freq: Once | ORAL | Status: AC
  Administered 2018-02-25: 1 via ORAL
  Filled 2018-02-24: qty 1

## 2018-02-24 MED ORDER — LACTATED RINGERS IV BOLUS
1000.0000 mL | Freq: Once | INTRAVENOUS | Status: AC
  Administered 2018-02-24: 1000 mL via INTRAVENOUS

## 2018-02-24 NOTE — ED Notes (Signed)
Results reviewed, no change in acuity at this time 

## 2018-02-24 NOTE — ED Notes (Signed)
No answer for vitals  

## 2018-02-24 NOTE — ED Provider Notes (Signed)
[facility] EMERGENCY DEPARTMENT Provider Note   CSN: [id number] Arrival date & time: 02/24/18  1132     History   Chief Complaint Chief Complaint  Patient presents with  . Abdominal Pain  . Chest Pain    HPI Brent Rogers is a 52 y.o. male.  The history is provided by the patient.  Abdominal Pain   This is a recurrent problem. The current episode started more than 2 days ago. The problem occurs constantly. The problem has been gradually worsening. The pain is associated with eating and a previous surgery. The pain is located in the LUQ, LLQ and epigastric region. The quality of the pain is cramping. The pain is severe. Associated symptoms include nausea and vomiting. Pertinent negatives include fever, dysuria, hematuria and arthralgias. The symptoms are aggravated by palpation and eating. Nothing relieves the symptoms. Past workup includes surgery. Past medical history comments: Hx of Whipple and chronic pancreatitis.    Past Medical History:  Diagnosis Date  . Benign tumor of endocrine pancreas   . Chronic pancreatitis ([place])    S/P Whipple  . Hypertension   . Migraine    "@ least once/month" (11/12/2015)  . Pancreatic abnormality    CT  shows mass  . Pneumonia 11/12/2015  . Scoliosis     Patient Active Problem List   Diagnosis Date Noted  . Chest pain 12/01/2017  . Pancreatitis 07/07/2017  . Chronic pancreatitis ([place]) 05/01/2017  . Malnutrition of moderate degree 03/20/2017  . Foot ulcer with fat layer exposed ([place])   . Foot callus 12/25/2015  . Onychomycosis of toenail 12/25/2015  . Fatty liver 11/12/2015  . GERD (gastroesophageal reflux disease) 11/12/2015  . HTN (hypertension) 07/10/2015  . S/P cholecystectomy 07/10/2015  . Tobacco abuse 12/26/2014  . Protein-calorie malnutrition, severe ([place]) 11/20/2014  . Exocrine pancreatic insufficiency ([place]) 12/23/2013    Past Surgical History:  Procedure Laterality Date  . BACK SURGERY    . EUS  N/A 09/21/2013   Procedure: ESOPHAGEAL ENDOSCOPIC ULTRASOUND (EUS) RADIAL;  Surgeon: Beryle Beams, MD;  [facility];  Service: Endoscopy;  Laterality: N/A;  . EUS N/A 09/30/2013   Procedure: UPPER ENDOSCOPIC ULTRASOUND (EUS) LINEAR;  Surgeon: Beryle Beams, MD;  [facility];  Service: Endoscopy;  Laterality: N/A;  . FINE NEEDLE ASPIRATION N/A 09/21/2013   Procedure: FINE NEEDLE ASPIRATION (FNA) LINEAR;  Surgeon: Beryle Beams, MD;  [facility];  Service: Endoscopy;  Laterality: N/A;  . FINGER FRACTURE SURGERY Left 1995   5th digit  . FRACTURE SURGERY    . KNEE ARTHROSCOPY Bilateral 1987-1989   right-left  . LAPAROSCOPY N/A 12/01/2013   Procedure: LAPAROSCOPY DIAGNOSTIC PANCREATICODUODENECTOMY WITH BILIARY AND PANCREATIC STENTS;  Surgeon: Stark Klein, MD;  [facility];  Service: General;  Laterality: N/A;  . LUMBAR [place] SURGERY  1990's  . WHIPPLE PROCEDURE  12/01/2013        Home Medications    Prior to Admission medications   Medication Sig Start Date End Date Taking? Authorizing Provider  acetaminophen (TYLENOL) 500 MG tablet Take 1,000 mg by mouth every 6 (six) hours as needed for moderate pain or headache.    [provider]  amLODipine (NORVASC) 5 MG tablet Take 1.5 tablets (7.5 mg total) by mouth daily. 08/27/17   Ladell Pier, MD  bismuth subsalicylate (PEPTO BISMOL) 262 MG/15ML suspension Take 30 mLs by mouth every 6 (six) hours as needed for indigestion.    [provider]  buPROPion (WELLBUTRIN SR) 100 MG 12 hr tablet Take 1 tablet (100 mg total) by mouth 2 (two) times daily. 02/23/18   Ladell Pier, MD  methocarbamol (ROBAXIN) 500 MG tablet Take 1 tablet (500 mg total) by mouth every 8 (eight) hours as needed for muscle spasms. 02/23/18   Ladell Pier, MD  ondansetron (ZOFRAN ODT) 4 MG disintegrating tablet Take 1 tablet (4 mg total) by mouth every 8 (eight) hours as needed for nausea or vomiting. 02/24/18    Mackuen, Courteney Lyn, MD  oxyCODONE-acetaminophen (PERCOCET/ROXICET) 5-325 MG tablet Take 1 tablet by mouth every 6 (six) hours as needed for severe pain. 02/24/18   Mackuen, Courteney Lyn, MD  Pancrelipase, Lip-Prot-Amyl, 24000-76000 units CPEP 24,000 units TID with meals 11/26/17   Ladell Pier, MD  pantoprazole (PROTONIX) 40 MG tablet Take 1 tablet (40 mg total) by mouth daily. 05/01/17   Ladell Pier, MD  promethazine (PHENERGAN) 25 MG tablet TAKE 1 TABLET BY MOUTH EVERY 8 HOURS AS NEEDED FOR NAUSEA OR VOMITING. 02/01/18   Ladell Pier, MD  traMADol (ULTRAM) 50 MG tablet Take 1 tablet (50 mg total) by mouth 2 (two) times daily as needed. 03/01/18   Ladell Pier, MD  traZODone (DESYREL) 50 MG tablet Take 0.5 tablets (25 mg total) by mouth at bedtime. 07/09/17   Nita Sells, MD    Family History Family History  Problem Relation Age of Onset  . Cancer Mother        unsure  . COPD Mother   . Hypertension Mother   . Hypertension Father   . COPD Father   . CAD Sister        possible has stent per pt    Social History Social History   Tobacco Use  . Smoking status: Current Every Day Smoker    Packs/day: 0.25    Years: 30.00    Pack years: 7.50    Types: Cigarettes  . Smokeless tobacco: Former Systems developer    Types: Snuff  . Tobacco comment: "used snuff 10-15 years in my 20s-30s"  Substance Use Topics  . Alcohol use: Yes    Alcohol/week: 2.4 oz    Types: 4 Cans of beer per week    Comment: 05/01/16  . Drug use: No     Allergies   Patient has no known allergies.   Review of Systems Review of Systems  Constitutional: Negative for chills and fever.  HENT: Negative for ear pain and sore throat.   Eyes: Negative for pain and visual disturbance.  Respiratory: Negative for cough and shortness of breath.   Cardiovascular: Positive for chest pain. Negative for palpitations.  Gastrointestinal: Positive for abdominal pain, nausea and vomiting.    Genitourinary: Negative for dysuria and hematuria.  Musculoskeletal: Negative for arthralgias and back pain.  Skin: Negative for color change and rash.  Neurological: Negative for seizures and syncope.  All other systems reviewed and are negative.    Physical Exam Updated Vital Signs BP (!) 148/107   Pulse 70   Temp 97.9 F (36.6 C) (Oral)   Resp 11   SpO2 98%   Physical Exam  Constitutional: He is oriented to person, place, and time. He appears well-developed and well-nourished.  HENT:  Head: Normocephalic and atraumatic.  Eyes: Conjunctivae are normal.  Neck: Neck supple.  Cardiovascular: Normal rate and regular rhythm.  No murmur heard. Pulmonary/Chest: Effort normal and breath sounds normal. No respiratory distress.  Abdominal: Soft. Bowel sounds are normal. He  exhibits no distension. There is tenderness. There is no rebound and no guarding.  Mild epigastric and left sided abd pain. No rebound or guarding.  Musculoskeletal: He exhibits no edema, tenderness or deformity.  Neurological: He is alert and oriented to person, place, and time.  Skin: Skin is warm and dry.  Psychiatric: He has a normal mood and affect.  Nursing note and vitals reviewed.    ED Treatments / Results  Labs (all labs ordered are listed, but only abnormal results are displayed) Labs Reviewed  BASIC METABOLIC PANEL - Abnormal; Notable for the following components:      Result Value   Glucose, Bld 147 (*)    BUN <5 (*)    All other components within normal limits  CBC - Abnormal; Notable for the following components:   RDW 18.1 (*)    All other components within normal limits  URINALYSIS, ROUTINE W REFLEX MICROSCOPIC - Abnormal; Notable for the following components:   Color, Urine AMBER (*)    Specific Gravity, Urine 1.032 (*)    Bilirubin Urine MODERATE (*)    Ketones, ur 20 (*)    Protein, ur 30 (*)    Bacteria, UA FEW (*)    All other components within normal limits  LIPASE, BLOOD   I-STAT TROPONIN, ED    EKG EKG Interpretation  Date/Time:  Wednesday Feb 24 2018 11:57:43 EDT Ventricular Rate:  82 PR Interval:  152 QRS Duration: 84 QT Interval:  386 QTC Calculation: 450 R Axis:   66 Text Interpretation:  Sinus rhythm with Premature atrial complexes with Abberant conduction Right atrial enlargement Borderline ECG Normal sinus rhythm Confirmed by Thomasene Lot, [place] [phone]) on 02/24/2018 6:02:10 PM   Radiology Dg Chest 2 View  Result Date: 02/24/2018 CLINICAL DATA:  Chest pain, current smoker. EXAM: CHEST - 2 VIEW COMPARISON:  Chest x-ray and chest CT scan of December 01, 2017 FINDINGS: The lungs are mildly hyperinflated. There is no focal infiltrate. There is no pleural effusion. The heart and pulmonary vascularity are normal. The mediastinum is normal in width. The bony thorax exhibits no acute abnormality. IMPRESSION: Chronic bronchitic-smoking related changes, stable. No pneumonia, CHF, nor other acute cardiopulmonary abnormality. Electronically Signed   By: David  Martinique M.D.   On: 02/24/2018 12:11    Procedures Procedures (including critical care time)  Medications Ordered in ED Medications  oxyCODONE-acetaminophen (PERCOCET/ROXICET) 5-325 MG per tablet 1 tablet (has no administration in time range)  oxyCODONE-acetaminophen (PERCOCET/ROXICET) 5-325 MG per tablet 1 tablet (1 tablet Oral Given 02/24/18 1150)  lactated ringers bolus 1,000 mL (0 mLs Intravenous Stopped 02/24/18 2207)  morphine 4 MG/ML injection 4 mg (4 mg Intravenous Given 02/24/18 1944)  ondansetron (ZOFRAN) injection 4 mg (4 mg Intravenous Given 02/24/18 1943)  oxyCODONE-acetaminophen (PERCOCET/ROXICET) 5-325 MG per tablet 1 tablet (1 tablet Oral Given 02/24/18 2243)     Initial Impression / Assessment and Plan / ED Course  I have reviewed the triage vital signs and the nursing notes.  Pertinent labs & imaging results that were available during my care of the patient were reviewed by me and  considered in my medical decision making (see chart for details).    Patient is a 52 year old male with history as above, notable for chronic pancreatitis since his Whipple procedure.  He presents today with epigastric and left-sided abdominal pain which he states is consistent with his pink otitis.  He states he has been vomiting for 2 days.  Here, his labs are unremarkable.  His  abdominal exam is notable for mild epigastric and left-sided abdominal tenderness.  His symptoms improved with IV narcotics and antiemetics.  He has not had any vomiting here.  His lipase is normal.  He has had prior admissions despite a normal lipase.  He is now tolerating p.o.  He is able to take p.o. narcotics in the ED.  He states that he has in the past left under these conditions and had to come back due to recurrent nausea and vomiting.  We stated that if this happens, will we will be happy to reevaluate him and likely admit if he fails outpatient management.  Based on his abdominal exam, I do not feel that imaging is warranted.  I discussed this with the patient and he is in agreement.  Return precautions discussed in detail.  Patient discharged in stable condition.   Final Clinical Impressions(s) / ED Diagnoses   Final diagnoses:  Acute pancreatitis, unspecified complication status, unspecified pancreatitis type    ED Discharge Orders        Ordered    oxyCODONE-acetaminophen (PERCOCET/ROXICET) 5-325 MG tablet  Every 6 hours PRN     02/24/18 2351    ondansetron (ZOFRAN ODT) 4 MG disintegrating tablet  Every 8 hours PRN     02/24/18 2351       Clifton James, MD 02/24/18 2359    Macarthur Critchley, MD 02/26/18 0001

## 2018-02-24 NOTE — Discharge Instructions (Signed)
We think you might have pancreatitis due to where your pain is.  However your labs and abdominal exam are normal.  If you have any increasing pain, focal changes, or vomiting return immediately to the emergency department.

## 2018-02-24 NOTE — ED Triage Notes (Signed)
Per EMS:  Patient presents to ED for assessment of LL abdominal pain shooting through to his back.  Hx of pancreatitis.  States it also radiates in to his chest, substernal, non-radiating, non-reproduceable.  C/o nausea and vomiting, denies diarrhea or changes in urination.  VSS

## 2018-02-25 MED ORDER — ONDANSETRON 4 MG PO TBDP: 4.0000 mg | ORAL_TABLET | Freq: Three times a day (TID) | ORAL | 0 refills | Status: DC | PRN

## 2018-02-25 MED ORDER — OXYCODONE-ACETAMINOPHEN 5-325 MG PO TABS: 1.0000 | ORAL_TABLET | Freq: Four times a day (QID) | ORAL | 0 refills | Status: DC | PRN

## 2018-02-25 NOTE — ED Notes (Signed)
Pt departed in NAD, refused use of wheelchair.  

## 2018-03-15 MED FILL — METHOCARBAMOL 500 MG TABLET: 500 | 5 days supply | Qty: 15 | Fill #0

## 2018-03-15 MED FILL — BUPROPION HCL SR 100 MG TAB: 100 | 30 days supply | Qty: 60 | Fill #0

## 2018-03-15 MED FILL — traMADol HCL 50 MG TABS: 50 | 30 days supply | Qty: 60 | Fill #0

## 2018-03-15 MED FILL — PROMETHAZINE 25 MG TABLET: 25 | 6 days supply | Qty: 20 | Fill #1

## 2018-03-31 ENCOUNTER — Other Ambulatory Visit: Payer: Self-pay

## 2018-03-31 MED ORDER — TRAZODONE HCL 50 MG PO TABS: 25.0000 mg | ORAL_TABLET | Freq: Every day | ORAL | 1 refills | Status: DC

## 2018-05-12 ENCOUNTER — Other Ambulatory Visit: Payer: Self-pay | Admitting: Internal Medicine

## 2018-05-12 DIAGNOSIS — K859 Acute pancreatitis without necrosis or infection, unspecified: Secondary | ICD-10-CM

## 2018-05-12 DIAGNOSIS — K861 Other chronic pancreatitis: Secondary | ICD-10-CM

## 2018-05-12 MED ORDER — TRAZODONE HCL 50 MG PO TABS: 25.0000 mg | ORAL_TABLET | Freq: Every day | ORAL | 1 refills | Status: DC

## 2018-05-12 MED FILL — AMLODIPINE BESYLATE 5 MG TA: 5 | 30 days supply | Qty: 45 | Fill #2

## 2018-05-17 NOTE — Telephone Encounter (Signed)
Patient has been called and informed of script being ready for pick up.

## 2018-07-22 ENCOUNTER — Encounter (HOSPITAL_COMMUNITY): Payer: Self-pay

## 2018-07-22 ENCOUNTER — Other Ambulatory Visit: Payer: Self-pay

## 2018-07-22 ENCOUNTER — Emergency Department (HOSPITAL_COMMUNITY): Admission: EM | Admit: 2018-07-22 | Discharge: 2018-07-22 | Discharge status: Against Medical Advice | Payer: Self-pay

## 2018-07-22 LAB — URINALYSIS, ROUTINE W REFLEX MICROSCOPIC
Bilirubin Urine: NEGATIVE
Glucose, UA: NEGATIVE mg/dL
Hgb urine dipstick: NEGATIVE
Ketones, ur: NEGATIVE mg/dL
Leukocytes, UA: NEGATIVE
Nitrite: NEGATIVE
Protein, ur: NEGATIVE mg/dL
Specific Gravity, Urine: 1.004 — ABNORMAL LOW (ref 1.005–1.030)
pH: 6 (ref 5.0–8.0)

## 2018-07-22 LAB — COMPREHENSIVE METABOLIC PANEL
ALT: 46 U/L — ABNORMAL HIGH (ref 0–44)
AST: 54 U/L — ABNORMAL HIGH (ref 15–41)
Albumin: 4 g/dL (ref 3.5–5.0)
Alkaline Phosphatase: 120 U/L (ref 38–126)
Anion gap: 14 (ref 5–15)
BUN: 6 mg/dL (ref 6–20)
CO2: 25 mmol/L (ref 22–32)
Calcium: 9.2 mg/dL (ref 8.9–10.3)
Chloride: 94 mmol/L — ABNORMAL LOW (ref 98–111)
Creatinine, Ser: 0.92 mg/dL (ref 0.61–1.24)
GFR calc Af Amer: >60 mL/min (ref >60)
GFR calc non Af Amer: >60 mL/min (ref >60)
Glucose, Bld: 111 mg/dL — ABNORMAL HIGH (ref 70–99)
Potassium: 3.9 mmol/L (ref 3.5–5.1)
Sodium: 133 mmol/L — ABNORMAL LOW (ref 135–145)
Total Bilirubin: 0.9 mg/dL (ref 0.3–1.2)
Total Protein: 7.1 g/dL (ref 6.5–8.1)

## 2018-07-22 LAB — CBC
HCT: 48.7 % (ref 39.0–52.0)
Hemoglobin: 15.8 g/dL (ref 13.0–17.0)
MCH: 27.6 pg (ref 26.0–34.0)
MCHC: 32.4 g/dL (ref 30.0–36.0)
MCV: 85.1 fL (ref 80.0–100.0)
Platelets: 235 10*3/uL (ref 150–400)
RBC: 5.72 MIL/uL (ref 4.22–5.81)
RDW: 14.2 % (ref 11.5–15.5)
WBC: 8.4 10*3/uL (ref 4.0–10.5)
nRBC: 0 % (ref 0.0–0.2)

## 2018-07-22 LAB — LIPASE, BLOOD: Lipase: 49 U/L (ref 11–51)

## 2018-07-22 NOTE — ED Triage Notes (Signed)
Pt here by EMS for abdominal pain for the last 3 days.  Nausea and vomiting for last 3 days.  A&Ox4, ambulatory to triage.  EMS gave 200 cc fluids en route.

## 2018-07-22 NOTE — ED Notes (Signed)
Pt states he is leaving. IV removed. Tried to convince pt to stay and pt unwilling.

## 2018-08-04 MED FILL — traMADol HCL 50 MG TABS: 50 | 30 days supply | Qty: 60 | Fill #0

## 2018-09-15 ENCOUNTER — Observation Stay (HOSPITAL_COMMUNITY)
Admission: EM | Admit: 2018-09-15 | Discharge: 2018-09-17 | Disposition: A | Discharge status: Home / Self Care | Payer: Self-pay | Attending: Internal Medicine | Admitting: Internal Medicine

## 2018-09-15 ENCOUNTER — Other Ambulatory Visit: Payer: Self-pay

## 2018-09-15 ENCOUNTER — Encounter (HOSPITAL_COMMUNITY): Payer: Self-pay | Admitting: Emergency Medicine

## 2018-09-15 ENCOUNTER — Emergency Department (HOSPITAL_COMMUNITY): Payer: Self-pay

## 2018-09-15 DIAGNOSIS — F419 Anxiety disorder, unspecified: Secondary | ICD-10-CM | POA: Insufficient documentation

## 2018-09-15 DIAGNOSIS — F17210 Nicotine dependence, cigarettes, uncomplicated: Secondary | ICD-10-CM | POA: Insufficient documentation

## 2018-09-15 DIAGNOSIS — Z90411 Acquired partial absence of pancreas: Secondary | ICD-10-CM | POA: Insufficient documentation

## 2018-09-15 DIAGNOSIS — R112 Nausea with vomiting, unspecified: Principal | ICD-10-CM | POA: Insufficient documentation

## 2018-09-15 DIAGNOSIS — R9431 Abnormal electrocardiogram [ECG] [EKG]: Secondary | ICD-10-CM | POA: Insufficient documentation

## 2018-09-15 DIAGNOSIS — K859 Acute pancreatitis without necrosis or infection, unspecified: Secondary | ICD-10-CM | POA: Insufficient documentation

## 2018-09-15 DIAGNOSIS — K8681 Exocrine pancreatic insufficiency: Secondary | ICD-10-CM | POA: Insufficient documentation

## 2018-09-15 DIAGNOSIS — K8689 Other specified diseases of pancreas: Secondary | ICD-10-CM | POA: Diagnosis present

## 2018-09-15 DIAGNOSIS — Z8249 Family history of ischemic heart disease and other diseases of the circulatory system: Secondary | ICD-10-CM | POA: Insufficient documentation

## 2018-09-15 DIAGNOSIS — Z79899 Other long term (current) drug therapy: Secondary | ICD-10-CM | POA: Insufficient documentation

## 2018-09-15 DIAGNOSIS — R1084 Generalized abdominal pain: Secondary | ICD-10-CM

## 2018-09-15 DIAGNOSIS — I491 Atrial premature depolarization: Secondary | ICD-10-CM | POA: Insufficient documentation

## 2018-09-15 DIAGNOSIS — E875 Hyperkalemia: Secondary | ICD-10-CM | POA: Insufficient documentation

## 2018-09-15 DIAGNOSIS — R109 Unspecified abdominal pain: Secondary | ICD-10-CM | POA: Insufficient documentation

## 2018-09-15 DIAGNOSIS — K861 Other chronic pancreatitis: Secondary | ICD-10-CM | POA: Insufficient documentation

## 2018-09-15 DIAGNOSIS — I10 Essential (primary) hypertension: Secondary | ICD-10-CM | POA: Insufficient documentation

## 2018-09-15 DIAGNOSIS — R739 Hyperglycemia, unspecified: Secondary | ICD-10-CM | POA: Insufficient documentation

## 2018-09-15 DIAGNOSIS — R000 Tachycardia, unspecified: Secondary | ICD-10-CM | POA: Insufficient documentation

## 2018-09-15 DIAGNOSIS — F329 Major depressive disorder, single episode, unspecified: Secondary | ICD-10-CM | POA: Insufficient documentation

## 2018-09-15 LAB — CBC
HCT: 50.4 % (ref 39.0–52.0)
Hemoglobin: 16.7 g/dL (ref 13.0–17.0)
MCH: 28.5 pg (ref 26.0–34.0)
MCHC: 33.1 g/dL (ref 30.0–36.0)
MCV: 86 fL (ref 80.0–100.0)
Platelets: 270 10*3/uL (ref 150–400)
RBC: 5.86 MIL/uL — ABNORMAL HIGH (ref 4.22–5.81)
RDW: 15.1 % (ref 11.5–15.5)
WBC: 9.6 10*3/uL (ref 4.0–10.5)
nRBC: 0 % (ref 0.0–0.2)

## 2018-09-15 LAB — COMPREHENSIVE METABOLIC PANEL
ALT: 26 U/L (ref 0–44)
ALT: 20 U/L (ref 0–44)
AST: 50 U/L — ABNORMAL HIGH (ref 15–41)
AST: 36 U/L (ref 15–41)
Albumin: 4.3 g/dL (ref 3.5–5.0)
Albumin: 3.8 g/dL (ref 3.5–5.0)
Alkaline Phosphatase: 112 U/L (ref 38–126)
Alkaline Phosphatase: 100 U/L (ref 38–126)
Anion gap: 18 — ABNORMAL HIGH (ref 5–15)
Anion gap: 12 (ref 5–15)
BUN: 7 mg/dL (ref 6–20)
BUN: 8 mg/dL (ref 6–20)
CO2: 21 mmol/L — ABNORMAL LOW (ref 22–32)
CO2: 22 mmol/L (ref 22–32)
Calcium: 9.4 mg/dL (ref 8.9–10.3)
Calcium: 8.5 mg/dL — ABNORMAL LOW (ref 8.9–10.3)
Chloride: 99 mmol/L (ref 98–111)
Chloride: 105 mmol/L (ref 98–111)
Creatinine, Ser: 1.04 mg/dL (ref 0.61–1.24)
Creatinine, Ser: 0.94 mg/dL (ref 0.61–1.24)
GFR calc Af Amer: >60 mL/min (ref >60)
GFR calc Af Amer: >60 mL/min (ref >60)
GFR calc non Af Amer: >60 mL/min (ref >60)
GFR calc non Af Amer: >60 mL/min (ref >60)
Glucose, Bld: 168 mg/dL — ABNORMAL HIGH (ref 70–99)
Glucose, Bld: 124 mg/dL — ABNORMAL HIGH (ref 70–99)
Potassium: 5.5 mmol/L — ABNORMAL HIGH (ref 3.5–5.1)
Potassium: 5.3 mmol/L — ABNORMAL HIGH (ref 3.5–5.1)
Sodium: 138 mmol/L (ref 135–145)
Sodium: 139 mmol/L (ref 135–145)
Total Bilirubin: 1.6 mg/dL — ABNORMAL HIGH (ref 0.3–1.2)
Total Bilirubin: 1.4 mg/dL — ABNORMAL HIGH (ref 0.3–1.2)
Total Protein: 7.3 g/dL (ref 6.5–8.1)
Total Protein: 6.5 g/dL (ref 6.5–8.1)

## 2018-09-15 LAB — I-STAT TROPONIN, ED
Troponin i, poc: 0 ng/mL (ref 0.00–0.08)
Troponin i, poc: 0.01 ng/mL (ref 0.00–0.08)

## 2018-09-15 LAB — URINALYSIS, ROUTINE W REFLEX MICROSCOPIC
Bilirubin Urine: NEGATIVE
Glucose, UA: NEGATIVE mg/dL
Hgb urine dipstick: NEGATIVE
Ketones, ur: 20 mg/dL — AB
Leukocytes, UA: NEGATIVE
Nitrite: NEGATIVE
Protein, ur: NEGATIVE mg/dL
Specific Gravity, Urine: 1.045 — ABNORMAL HIGH (ref 1.005–1.030)
pH: 6 (ref 5.0–8.0)

## 2018-09-15 LAB — LIPASE, BLOOD: Lipase: 29 U/L (ref 11–51)

## 2018-09-15 LAB — I-STAT CG4 LACTIC ACID, ED
Lactic Acid, Venous: 4.42 mmol/L (ref 0.5–1.9)
Lactic Acid, Venous: 1.48 mmol/L (ref 0.5–1.9)

## 2018-09-15 MED ORDER — DICYCLOMINE HCL 10 MG PO CAPS
20.0000 mg | ORAL_CAPSULE | Freq: Once | ORAL | Status: AC
  Administered 2018-09-15: 20 mg via ORAL
  Filled 2018-09-15: qty 2

## 2018-09-15 MED ORDER — MORPHINE SULFATE (PF) 2 MG/ML IV SOLN
2.0000 mg | INTRAVENOUS | Status: DC | PRN
  Administered 2018-09-16 (×4): 2 mg via INTRAVENOUS
  Filled 2018-09-15 (×4): qty 1

## 2018-09-15 MED ORDER — TRAZODONE HCL 50 MG PO TABS
25.0000 mg | ORAL_TABLET | Freq: Every day | ORAL | Status: DC
  Administered 2018-09-16 – 2018-09-17 (×2): 25 mg via ORAL
  Filled 2018-09-15 (×2): qty 1

## 2018-09-15 MED ORDER — PANTOPRAZOLE SODIUM 40 MG PO TBEC
40.0000 mg | DELAYED_RELEASE_TABLET | Freq: Every day | ORAL | Status: DC
  Administered 2018-09-16 – 2018-09-17 (×2): 40 mg via ORAL
  Filled 2018-09-15 (×2): qty 1

## 2018-09-15 MED ORDER — SODIUM CHLORIDE 0.9 % IV SOLN: INTRAVENOUS | Status: DC

## 2018-09-15 MED ORDER — HYDRALAZINE HCL 20 MG/ML IJ SOLN
5.0000 mg | INTRAMUSCULAR | Status: DC | PRN
  Administered 2018-09-16: 5 mg via INTRAVENOUS
  Filled 2018-09-15: qty 1

## 2018-09-15 MED ORDER — AMLODIPINE BESYLATE 2.5 MG PO TABS: 7.5000 mg | ORAL_TABLET | Freq: Every day | ORAL | Status: DC

## 2018-09-15 MED ORDER — PANCRELIPASE (LIP-PROT-AMYL) 12000-38000 UNITS PO CPEP
24000.0000 [IU] | ORAL_CAPSULE | Freq: Three times a day (TID) | ORAL | Status: DC
  Administered 2018-09-16 – 2018-09-17 (×4): 24000 [IU] via ORAL
  Filled 2018-09-15 (×5): qty 2

## 2018-09-15 MED ORDER — MORPHINE SULFATE (PF) 4 MG/ML IV SOLN
4.0000 mg | Freq: Once | INTRAVENOUS | Status: AC
  Administered 2018-09-15: 4 mg via INTRAVENOUS
  Filled 2018-09-15: qty 1

## 2018-09-15 MED ORDER — SODIUM CHLORIDE 0.9 % IV SOLN
INTRAVENOUS | Status: AC
  Administered 2018-09-16 (×2): via INTRAVENOUS

## 2018-09-15 MED ORDER — METHOCARBAMOL 500 MG PO TABS: 500.0000 mg | ORAL_TABLET | Freq: Three times a day (TID) | ORAL | Status: DC | PRN

## 2018-09-15 MED ORDER — ACETAMINOPHEN 650 MG RE SUPP: 650.0000 mg | Freq: Four times a day (QID) | RECTAL | Status: DC | PRN

## 2018-09-15 MED ORDER — SODIUM CHLORIDE 0.9 % IV BOLUS
1000.0000 mL | Freq: Once | INTRAVENOUS | Status: AC
  Administered 2018-09-15: 1000 mL via INTRAVENOUS

## 2018-09-15 MED ORDER — ONDANSETRON HCL 4 MG/2ML IJ SOLN
4.0000 mg | Freq: Once | INTRAMUSCULAR | Status: AC
  Administered 2018-09-15: 4 mg via INTRAVENOUS
  Filled 2018-09-15: qty 2

## 2018-09-15 MED ORDER — BUPROPION HCL ER (SR) 100 MG PO TB12
100.0000 mg | ORAL_TABLET | Freq: Two times a day (BID) | ORAL | Status: DC
  Administered 2018-09-16 – 2018-09-17 (×4): 100 mg via ORAL
  Filled 2018-09-15 (×4): qty 1

## 2018-09-15 MED ORDER — ONDANSETRON HCL 4 MG/2ML IJ SOLN
4.0000 mg | Freq: Four times a day (QID) | INTRAMUSCULAR | Status: DC | PRN
  Administered 2018-09-16 (×3): 4 mg via INTRAVENOUS
  Filled 2018-09-15 (×3): qty 2

## 2018-09-15 MED ORDER — ACETAMINOPHEN 325 MG PO TABS: 650.0000 mg | ORAL_TABLET | Freq: Four times a day (QID) | ORAL | Status: DC | PRN

## 2018-09-15 MED ORDER — ENOXAPARIN SODIUM 40 MG/0.4ML ~~LOC~~ SOLN
40.0000 mg | SUBCUTANEOUS | Status: DC
  Administered 2018-09-16 – 2018-09-17 (×2): 40 mg via SUBCUTANEOUS
  Filled 2018-09-15 (×2): qty 0.4

## 2018-09-15 MED ORDER — IOHEXOL 300 MG/ML  SOLN
100.0000 mL | Freq: Once | INTRAMUSCULAR | Status: AC | PRN
  Administered 2018-09-15: 100 mL via INTRAVENOUS

## 2018-09-15 MED ORDER — ONDANSETRON HCL 4 MG PO TABS: 4.0000 mg | ORAL_TABLET | Freq: Four times a day (QID) | ORAL | Status: DC | PRN

## 2018-09-15 NOTE — ED Notes (Signed)
Patient transported to CT 

## 2018-09-15 NOTE — ED Triage Notes (Signed)
Patient from home, c/o "pancreas flaring up again." states he had whipple surgery in 2015 and has these flare ups occasionally. Also c/o n/v and 10/10 LLQ abdominal pain.

## 2018-09-15 NOTE — ED Provider Notes (Signed)
[facility] Provider Note   CSN: [id number] Arrival date & time: 09/15/18  1613     History   Chief Complaint Chief Complaint  Patient presents with  . Pancreatitis    HPI Brent Rogers is a 52 y.o. 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  He tells me that this feels like his usual flareup of his pancreatitis.  His pain has been going on for about a week however it got significantly worse yesterday.  He reports that he is thrown up approximately 10 times today with 1 or 2 episodes of loose stools.  Denies any fevers at home.  No shortness of breath.  His pain is 10/10, located on the left side of his abdomen.   HPI  Past Medical History:  Diagnosis Date  . Benign tumor of endocrine pancreas   . Chronic pancreatitis ([place])    S/P Whipple  . Hypertension   . Migraine    "@ least once/month" (11/12/2015)  . Pancreatic abnormality    CT  shows mass  . Pneumonia 11/12/2015  . Scoliosis     Patient Active Problem List   Diagnosis Date Noted  . Chest pain 12/01/2017  . Pancreatitis 07/07/2017  . Chronic pancreatitis ([place]) 05/01/2017  . Malnutrition of moderate degree 03/20/2017  . Foot ulcer with fat layer exposed ([place])   . Foot callus 12/25/2015  . Onychomycosis of toenail 12/25/2015  . Fatty liver 11/12/2015  . GERD (gastroesophageal reflux disease) 11/12/2015  . HTN (hypertension) 07/10/2015  . S/P cholecystectomy 07/10/2015  . Tobacco abuse 12/26/2014  . Protein-calorie malnutrition, severe ([place]) 11/20/2014  . Exocrine pancreatic insufficiency ([place]) 12/23/2013    Past Surgical History:  Procedure Laterality Date  . BACK SURGERY    . EUS N/A 09/21/2013   Procedure: ESOPHAGEAL ENDOSCOPIC ULTRASOUND (EUS) RADIAL;  Surgeon: Beryle Beams, MD;  Location: [facility];  Service:  Endoscopy;  Laterality: N/A;  . EUS N/A 09/30/2013   Procedure: UPPER ENDOSCOPIC ULTRASOUND (EUS) LINEAR;  Surgeon: Beryle Beams, MD;  Location: [facility];  Service: Endoscopy;  Laterality: N/A;  . FINE NEEDLE ASPIRATION N/A 09/21/2013   Procedure: FINE NEEDLE ASPIRATION (FNA) LINEAR;  Surgeon: Beryle Beams, MD;  Location: [facility];  Service: Endoscopy;  Laterality: N/A;  . FINGER FRACTURE SURGERY Left 1995   5th digit  . FRACTURE SURGERY    . KNEE ARTHROSCOPY Bilateral 1987-1989   right-left  . LAPAROSCOPY N/A 12/01/2013   Procedure: LAPAROSCOPY DIAGNOSTIC PANCREATICODUODENECTOMY WITH BILIARY AND PANCREATIC STENTS;  Surgeon: Stark Klein, MD;  Location: [facility];  Service: General;  Laterality: N/A;  . LUMBAR [place] SURGERY  1990's  . WHIPPLE PROCEDURE  12/01/2013        Home Medications    Prior to Admission medications   Medication Sig Start Date End Date Taking? Authorizing Provider  acetaminophen (TYLENOL) 500 MG tablet Take 1,000 mg by mouth every 6 (six) hours as needed for moderate pain or headache.   Yes [provider]  amLODipine (NORVASC) 5 MG tablet Take 1.5 tablets (7.5 mg total) by mouth daily. 08/27/17  Yes Ladell Pier, MD  bismuth subsalicylate (PEPTO BISMOL) 262 MG/15ML suspension Take 30 mLs by mouth every 6 (six) hours as needed for indigestion.   Yes [provider]  buPROPion (WELLBUTRIN SR) 100 MG 12 hr tablet Take 1  tablet (100 mg total) by mouth 2 (two) times daily. 02/23/18  Yes Ladell Pier, MD  methocarbamol (ROBAXIN) 500 MG tablet Take 1 tablet (500 mg total) by mouth every 8 (eight) hours as needed for muscle spasms. 02/23/18  Yes Ladell Pier, MD  ondansetron (ZOFRAN ODT) 4 MG disintegrating tablet Take 1 tablet (4 mg total) by mouth every 8 (eight) hours as needed for nausea or vomiting. 02/25/18  Yes Mackuen, Courteney Lyn, MD  oxyCODONE-acetaminophen (PERCOCET/ROXICET) 5-325 MG tablet Take 1 tablet by mouth every 6  (six) hours as needed for severe pain. 02/25/18  Yes Mackuen, Courteney Lyn, MD  Pancrelipase, Lip-Prot-Amyl, 24000-76000 units CPEP 24,000 units TID with meals 11/26/17  Yes Ladell Pier, MD  pantoprazole (PROTONIX) 40 MG tablet Take 1 tablet (40 mg total) by mouth daily. 05/01/17  Yes Ladell Pier, MD  promethazine (PHENERGAN) 25 MG tablet TAKE 1 TABLET BY MOUTH EVERY 8 HOURS AS NEEDED FOR NAUSEA OR VOMITING. Patient taking differently: Take 25 mg by mouth every 8 (eight) hours as needed for nausea.  02/01/18  Yes Ladell Pier, MD  traMADol (ULTRAM) 50 MG tablet TAKE 1 TABLET BY MOUTH TWICE DAILY AS NEEDED. Patient taking differently: Take 50 mg by mouth 2 (two) times daily as needed for moderate pain.  05/14/18  Yes Ladell Pier, MD  traZODone (DESYREL) 50 MG tablet Take 0.5 tablets (25 mg total) by mouth at bedtime. 05/12/18  Yes Ladell Pier, MD  promethazine (PHENERGAN) 25 MG tablet TAKE 1 TABLET BY MOUTH EVERY 8 HOURS AS NEEDED FOR NAUSEA OR VOMITING. Patient not taking: Reported on 09/15/2018 05/14/18   Ladell Pier, MD    Family History Family History  Problem Relation Age of Onset  . Cancer Mother        unsure  . COPD Mother   . Hypertension Mother   . Hypertension Father   . COPD Father   . CAD Sister        possible has stent per pt    Social History Social History   Tobacco Use  . Smoking status: Current Every Day Smoker    Packs/day: 0.25    Years: 30.00    Pack years: 7.50    Types: Cigarettes  . Smokeless tobacco: Former Systems developer    Types: Snuff  . Tobacco comment: "used snuff 10-15 years in my 20s-30s"  Substance Use Topics  . Alcohol use: Yes    Alcohol/week: 4.0 standard drinks    Types: 4 Cans of beer per week    Comment: 05/01/16  . Drug use: No     Allergies   Patient has no known allergies.   Review of Systems Review of Systems  Constitutional: Negative for chills and fever.  Respiratory: Negative for chest tightness  and shortness of breath.   Cardiovascular: Positive for chest pain. Negative for palpitations and leg swelling.  Gastrointestinal: Positive for abdominal pain, diarrhea, nausea and vomiting. Negative for anal bleeding.  Neurological: Positive for light-headedness.     Physical Exam Updated Vital Signs BP (!) 165/113   Pulse 81   Temp 98.1 F (36.7 C) (Oral)   Resp (!) 21   Ht 6\' 3"  (1.905 m)   Wt 65.8 kg   SpO2 98%   BMI 18.12 kg/m   Physical Exam Vitals signs and nursing note reviewed.  Constitutional:      Appearance: He is well-developed.     Comments: Appears thin  HENT:  Head: Normocephalic and atraumatic.     Nose: Nose normal.     Mouth/Throat:     Mouth: Mucous membranes are moist.  Eyes:     Conjunctiva/sclera: Conjunctivae normal.  Neck:     Musculoskeletal: Normal range of motion and neck supple.  Cardiovascular:     Rate and Rhythm: Regular rhythm. Tachycardia present.     Pulses: Normal pulses.          Posterior tibial pulses are 2+ on the right side and 2+ on the left side.     Heart sounds: Normal heart sounds. No murmur.  Pulmonary:     Effort: Pulmonary effort is normal. No respiratory distress.     Breath sounds: Normal breath sounds. No decreased breath sounds, wheezing, rhonchi or rales.  Abdominal:     General: Abdomen is flat. Bowel sounds are normal. There is no distension.     Palpations: Abdomen is soft. There is no mass.     Tenderness: There is abdominal tenderness in the epigastric area, periumbilical area, suprapubic area, left upper quadrant and left lower quadrant. There is guarding and rebound.     Hernia: No hernia is present.  Musculoskeletal:     Right lower leg: No edema.     Left lower leg: No edema.  Skin:    General: Skin is warm and dry.  Neurological:     Mental Status: He is alert.      ED Treatments / Results  Labs (all labs ordered are listed, but only abnormal results are displayed) Labs Reviewed    COMPREHENSIVE METABOLIC PANEL - Abnormal; Notable for the following components:      Result Value   Potassium 5.5 (*)    CO2 21 (*)    Glucose, Bld 168 (*)    AST 50 (*)    Total Bilirubin 1.6 (*)    Anion gap 18 (*)    All other components within normal limits  CBC - Abnormal; Notable for the following components:   RBC 5.86 (*)    All other components within normal limits  URINALYSIS, ROUTINE W REFLEX MICROSCOPIC - Abnormal; Notable for the following components:   Specific Gravity, Urine 1.045 (*)    Ketones, ur 20 (*)    All other components within normal limits  I-STAT CG4 LACTIC ACID, ED - Abnormal; Notable for the following components:   Lactic Acid, Venous 4.42 (*)    All other components within normal limits  URINE CULTURE  CULTURE, BLOOD (ROUTINE X 2)  CULTURE, BLOOD (ROUTINE X 2)  LIPASE, BLOOD  COMPREHENSIVE METABOLIC PANEL  I-STAT TROPONIN, ED  I-STAT CG4 LACTIC ACID, ED  I-STAT TROPONIN, ED    EKG None  Radiology Dg Chest 2 View  Result Date: 09/15/2018 CLINICAL DATA:  Left-sided chest pain and left upper quadrant pain x3 days. EXAM: CHEST - 2 VIEW COMPARISON:  None. FINDINGS: The heart size and mediastinal contours are within normal limits. Both lungs are clear. The visualized skeletal structures are unremarkable. IMPRESSION: No active cardiopulmonary disease. Electronically Signed   By: Ashley Royalty M.D.   On: 09/15/2018 17:53   Ct Abdomen Pelvis W Contrast  Result Date: 09/15/2018 CLINICAL DATA:  Nausea and vomiting. Left abdominal and chest pain. History of Whipple procedure. EXAM: CT ABDOMEN AND PELVIS WITH CONTRAST TECHNIQUE: Multidetector CT imaging of the abdomen and pelvis was performed using the standard protocol following bolus administration of intravenous contrast. CONTRAST:  157mL OMNIPAQUE IOHEXOL 300 MG/ML  SOLN COMPARISON:  12/01/2017 FINDINGS: Lower chest: Lung bases are clear. Hepatobiliary: Small amount of gas in the biliary system and  compatible with prior Whipple procedure. Stable appearance of the liver without suspicious lesion and the main portal venous system is patent. No biliary dilatation. Pancreas: Normal appearance of the mid and distal pancreas without dilatation or inflammatory changes. Spleen: Normal in size without focal abnormality. Adrenals/Urinary Tract: Normal appearance of the adrenal glands. Small hypodensities in the kidneys are compatible with small cysts. No suspicious renal lesions and no hydronephrosis. Small amount of fluid in the urinary bladder. Stomach/Bowel: Postsurgical changes compatible with Whipple procedure. There is no bowel dilatation and no focal bowel inflammation. Moderate amount of stool in the distal colon. Vascular/Lymphatic: Atherosclerotic calcifications in the abdominal and iliac arteries. Negative for an aortic aneurysm. No lymph node enlargement in the abdomen or pelvis. Reproductive: Prostate is unremarkable. Other: No free fluid.  Negative for free air. Musculoskeletal: Disc space narrowing along the posterior aspect of L5-S1. Vacuum disc at L5-S1. IMPRESSION: 1. No acute abnormality in the abdomen or pelvis. 2. Postsurgical changes compatible with history of Whipple procedure. Electronically Signed   By: Markus Daft M.D.   On: 09/15/2018 19:11    Procedures Procedures (including critical care time)  Medications Ordered in ED Medications  sodium chloride 0.9 % bolus 1,000 mL (0 mLs Intravenous Stopped 09/15/18 2102)  morphine 4 MG/ML injection 4 mg (4 mg Intravenous Given 09/15/18 1809)  ondansetron (ZOFRAN) injection 4 mg (4 mg Intravenous Given 09/15/18 1809)  sodium chloride 0.9 % bolus 1,000 mL (0 mLs Intravenous Stopped 09/15/18 2102)  iohexol (OMNIPAQUE) 300 MG/ML solution 100 mL (100 mLs Intravenous Contrast Given 09/15/18 1827)  morphine 4 MG/ML injection 4 mg (4 mg Intravenous Given 09/15/18 2054)  dicyclomine (BENTYL) capsule 20 mg (20 mg Oral Given 09/15/18 2053)      Initial Impression / Assessment and Plan / ED Course  I have reviewed the triage vital signs and the nursing notes.  Pertinent labs & imaging results that were available during my care of the patient were reviewed by me and considered in my medical decision making (see chart for details).  Clinical Course as of Sep 15 2210  Wed Sep 15, 2018  1732 30/kg ordered The patient is noted to have a MAP's <65/ SBP's <90. With the current information available to me, I don't think the patient is in septic shock. The MAP's <65/ SBP's <90, is related to an acute condition that is not due to an infection, primarily dehydration.    Lactic Acid, Venous(!!): 4.42 [EH]  2006 Patient re-evaluated, reports pain is unchanged.  He is no longer tachycardic.  He is laying in bed, appears comfortable. Will order additional morphine, bentyl, repeat BMP and Trop.   [EH]  2012 Lactic Acid, Venous: 1.48 [EH]    Clinical Course User Index [EH] Lorin Glass, PA-C   Patient presents today for evaluation of generalized abdominal pain.  Initially he is concerned that this may be as pancreatitis as it feels similar.  Labs were obtained and reviewed, lipase is not elevated.  He does not have a significant leukocytosis, is not anemic.  CMP shows elevated potassium at 5.5.  He does not take any potassium pills.  Glucose is mildly elevated at 168.  AST slightly elevated at 50, which is not abnormal for patient.  His total bilirubin is slightly elevated at 1.6 with an anion gap of 18.  Lactic acid was obtained, initial lactic acid 4.42.  He was started on approximately 30/kg fluid bolus.  Code sepsis was not called as he was not significantly tachycardic, does not have leukocytosis, is not febrile and is not tachypneic.  Suspect that his lactic acidosis is secondary to dehydration.    CT abdomen pelvis with contrast was obtained without evidence of acute abnormalities or cause for his pain.  Chest x-ray did not show  consolidation, pneumothorax, or other cause for his pain.  Troponin negative x2.    Plan is to follow on repeat CMP.  At shift change care was transferred to Dr. Alvino Chapel who will follow pending studies, re-evaulate and determine disposition.      Final Clinical Impressions(s) / ED Diagnoses   Final diagnoses:  Generalized abdominal pain    ED Discharge Orders    None       Ollen Gross 09/15/18 2211    Davonna Belling, MD 09/15/18 2302

## 2018-09-15 NOTE — H&P (Signed)
History and Physical    Brent Rogers BSW:[id number] DOB: 1966/03/23 DOA: 09/15/2018  PCP: Ladell Pier, MD   Patient coming from: Home.  Chief Complaint: Abdominal pain nausea vomiting.  HPI: Brent Rogers is a 52 y.o. 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  Abdominal pain is mostly in the epigastric area radiating across the left side of the abdomen to the back.  Had multiple episodes of vomiting denies any diarrhea or any blood in the vomitus.  Denies chest pain or shortness of breath.  Patient states he drinks alcohol very occasionally.  ED Course: In the ER patient continued to remain nauseous and had vomiting episodes.  CT of the abdomen and pelvis was unremarkable.  Patient's labs revealed mild hyperkalemia which improved with fluids.  Patient is being admitted for further management of intractable nausea vomiting.  Review of Systems: As per HPI, rest all negative.   Past Medical History:  Diagnosis Date  . Benign tumor of endocrine pancreas   . Chronic pancreatitis ([place])    S/P Whipple  . Hypertension   . Migraine    "@ least once/month" (11/12/2015)  . Pancreatic abnormality    CT  shows mass  . Pneumonia 11/12/2015  . Scoliosis     Past Surgical History:  Procedure Laterality Date  . BACK SURGERY    . EUS N/A 09/21/2013   Procedure: ESOPHAGEAL ENDOSCOPIC ULTRASOUND (EUS) RADIAL;  Surgeon: Beryle Beams, MD;  Location: [facility];  Service: Endoscopy;  Laterality: N/A;  . EUS N/A 09/30/2013   Procedure: UPPER ENDOSCOPIC ULTRASOUND (EUS) LINEAR;  Surgeon: Beryle Beams, MD;  Location: [facility];  Service: Endoscopy;  Laterality: N/A;  . FINE NEEDLE ASPIRATION N/A 09/21/2013   Procedure: FINE NEEDLE ASPIRATION (FNA) LINEAR;  Surgeon: Beryle Beams,  MD;  Location: [facility];  Service: Endoscopy;  Laterality: N/A;  . FINGER FRACTURE SURGERY Left 1995   5th digit  . FRACTURE SURGERY    . KNEE ARTHROSCOPY Bilateral 1987-1989   right-left  . LAPAROSCOPY N/A 12/01/2013   Procedure: LAPAROSCOPY DIAGNOSTIC PANCREATICODUODENECTOMY WITH BILIARY AND PANCREATIC STENTS;  Surgeon: Stark Klein, MD;  Location: [facility];  Service: General;  Laterality: N/A;  . LUMBAR [place] SURGERY  1990's  . WHIPPLE PROCEDURE  12/01/2013     reports that he has been smoking cigarettes. He has a 7.50 pack-year smoking history. He has quit using smokeless tobacco.  His smokeless tobacco use included snuff. He reports current alcohol use of about 4.0 standard drinks of alcohol per week. He reports that he does not use drugs.  No Known Allergies  Family History  Problem Relation Age of Onset  . Cancer Mother        unsure  . COPD Mother   . Hypertension Mother   . Hypertension Father   . COPD Father   . CAD Sister        possible has stent per pt    Prior to Admission medications   Medication Sig Start Date End Date Taking? Authorizing Provider  acetaminophen (TYLENOL) 500 MG tablet Take 1,000 mg by mouth every 6 (six) hours as needed for moderate pain or headache.   Yes [provider]  amLODipine (NORVASC) 5 MG tablet Take 1.5 tablets (7.5 mg total) by mouth daily. 08/27/17  Yes Ladell Pier, MD  bismuth subsalicylate (PEPTO BISMOL) 262 MG/15ML suspension Take 30 mLs by mouth every 6 (six) hours as needed for indigestion.   Yes [provider]  buPROPion (WELLBUTRIN SR) 100 MG 12 hr tablet Take 1 tablet (100 mg total) by mouth 2 (two) times daily. 02/23/18  Yes Ladell Pier, MD  methocarbamol (ROBAXIN) 500 MG tablet Take 1 tablet (500 mg total) by mouth every 8 (eight) hours as needed for muscle spasms. 02/23/18  Yes Ladell Pier, MD  ondansetron (ZOFRAN ODT) 4 MG disintegrating tablet Take 1 tablet (4 mg total) by mouth every 8  (eight) hours as needed for nausea or vomiting. 02/25/18  Yes Mackuen, Courteney Lyn, MD  oxyCODONE-acetaminophen (PERCOCET/ROXICET) 5-325 MG tablet Take 1 tablet by mouth every 6 (six) hours as needed for severe pain. 02/25/18  Yes Mackuen, Courteney Lyn, MD  Pancrelipase, Lip-Prot-Amyl, 24000-76000 units CPEP 24,000 units TID with meals 11/26/17  Yes Ladell Pier, MD  pantoprazole (PROTONIX) 40 MG tablet Take 1 tablet (40 mg total) by mouth daily. 05/01/17  Yes Ladell Pier, MD  promethazine (PHENERGAN) 25 MG tablet TAKE 1 TABLET BY MOUTH EVERY 8 HOURS AS NEEDED FOR NAUSEA OR VOMITING. Patient taking differently: Take 25 mg by mouth every 8 (eight) hours as needed for nausea.  02/01/18  Yes Ladell Pier, MD  traMADol (ULTRAM) 50 MG tablet TAKE 1 TABLET BY MOUTH TWICE DAILY AS NEEDED. Patient taking differently: Take 50 mg by mouth 2 (two) times daily as needed for moderate pain.  05/14/18  Yes Ladell Pier, MD  traZODone (DESYREL) 50 MG tablet Take 0.5 tablets (25 mg total) by mouth at bedtime. 05/12/18  Yes Ladell Pier, MD  promethazine (PHENERGAN) 25 MG tablet TAKE 1 TABLET BY MOUTH EVERY 8 HOURS AS NEEDED FOR NAUSEA OR VOMITING. Patient not taking: Reported on 09/15/2018 05/14/18   Ladell Pier, MD    Physical Exam: Vitals:   09/15/18 2215 09/15/18 2230 09/15/18 2245 09/15/18 2300  BP: (!) 164/103 (!) 156/105 (!) 153/114 (!) 143/108  Pulse: 69 65 69 70  Resp: (!) 23 (!) 21 16 16   Temp:      TempSrc:      SpO2: 100% 98% 99% 99%  Weight:      Height:          Constitutional: Moderately built and nourished. Vitals:   09/15/18 2215 09/15/18 2230 09/15/18 2245 09/15/18 2300  BP: (!) 164/103 (!) 156/105 (!) 153/114 (!) 143/108  Pulse: 69 65 69 70  Resp: (!) 23 (!) 21 16 16   Temp:      TempSrc:      SpO2: 100% 98% 99% 99%  Weight:      Height:       Eyes: Anicteric no pallor. ENMT: No discharge from the ears eyes nose or mouth. Neck: No mass felt.   No neck rigidity.  No JVD appreciated. Respiratory: No rhonchi or crepitations. Cardiovascular: S1-S2 heard. Abdomen: Soft nontender bowel sounds present. Musculoskeletal: No edema. Skin: No rash. Neurologic: Alert awake oriented to time place and person.  Moves all extremities. Psychiatric: Appears normal.   Labs on Admission: I have personally reviewed following labs and imaging studies  CBC: Recent Labs  Lab 09/15/18 1635  WBC 9.6  HGB 16.7  HCT 50.4  MCV 86.0  PLT 580   Basic Metabolic Panel: Recent Labs  Lab 09/15/18 1635 09/15/18 2103  NA 138 139  K 5.5* 5.3*  CL 99 105  CO2 21* 22  GLUCOSE 168* 124*  BUN 7 8  CREATININE 1.04 0.94  CALCIUM 9.4 8.5*   GFR: Estimated Creatinine Clearance: 85.6 mL/min (by C-G formula based on SCr of 0.94 mg/dL). Liver Function Tests: Recent Labs  Lab 09/15/18 1635 09/15/18 2103  AST 50* 36  ALT 26 20  ALKPHOS 112 100  BILITOT 1.6* 1.4*  PROT 7.3 6.5  ALBUMIN 4.3 3.8   Recent Labs  Lab 09/15/18 1635  LIPASE 29   No results for input(s): AMMONIA in the last 168 hours. Coagulation Profile: No results for input(s): INR, PROTIME in the last 168 hours. Cardiac Enzymes: No results for input(s): CKTOTAL, CKMB, CKMBINDEX, TROPONINI in the last 168 hours. BNP (last 3 results) No results for input(s): PROBNP in the last 8760 hours. HbA1C: No results for input(s): HGBA1C in the last 72 hours. CBG: No results for input(s): GLUCAP in the last 168 hours. Lipid Profile: No results for input(s): CHOL, HDL, LDLCALC, TRIG, CHOLHDL, LDLDIRECT in the last 72 hours. Thyroid Function Tests: No results for input(s): TSH, T4TOTAL, FREET4, T3FREE, THYROIDAB in the last 72 hours. Anemia Panel: No results for input(s): VITAMINB12, FOLATE, FERRITIN, TIBC, IRON, RETICCTPCT in the last 72 hours. Urine analysis:    Component Value Date/Time   COLORURINE YELLOW 09/15/2018 1950   APPEARANCEUR CLEAR 09/15/2018 1950   LABSPEC 1.045 (H)  09/15/2018 1950   PHURINE 6.0 09/15/2018 1950   GLUCOSEU NEGATIVE 09/15/2018 1950   [place] NEGATIVE 09/15/2018 1950   [place] NEGATIVE 09/15/2018 1950   KETONESUR 20 (A) 09/15/2018 1950   PROTEINUR NEGATIVE 09/15/2018 1950   UROBILINOGEN 1.0 06/23/2015 1513   NITRITE NEGATIVE 09/15/2018 1950   LEUKOCYTESUR NEGATIVE 09/15/2018 1950   Sepsis Labs: @LABRCNTIP (procalcitonin:4,lacticidven:4) )No results found for this or any previous visit (from the past 240 hour(s)).   Radiological Exams on Admission: Dg Chest 2 View  Result Date: 09/15/2018 CLINICAL DATA:  Left-sided chest pain and left upper quadrant pain x3 days. EXAM: CHEST - 2 VIEW COMPARISON:  None. FINDINGS: The heart size and mediastinal contours are within normal limits. Both lungs are clear. The visualized skeletal structures are unremarkable. IMPRESSION: No active cardiopulmonary disease. Electronically Signed   By: Ashley Royalty M.D.   On: 09/15/2018 17:53   Ct Abdomen Pelvis W Contrast  Result Date: 09/15/2018 CLINICAL DATA:  Nausea and vomiting. Left abdominal and chest pain. History of Whipple procedure. EXAM: CT ABDOMEN AND PELVIS WITH CONTRAST TECHNIQUE: Multidetector CT imaging of the abdomen and pelvis was performed using the standard protocol following bolus administration of intravenous contrast. CONTRAST:  19mL OMNIPAQUE IOHEXOL 300 MG/ML  SOLN COMPARISON:  12/01/2017 FINDINGS: Lower chest: Lung bases are clear. Hepatobiliary: Small amount of gas in the biliary system and compatible with prior Whipple procedure. Stable appearance of the liver without suspicious lesion and the main portal venous system is patent. No biliary dilatation. Pancreas: Normal appearance of the mid and distal pancreas without dilatation or inflammatory changes. Spleen: Normal in size without focal abnormality. Adrenals/Urinary Tract: Normal appearance of the adrenal glands. Small hypodensities in the kidneys are compatible with small cysts. No  suspicious renal lesions and no hydronephrosis. Small amount of fluid in the urinary bladder. Stomach/Bowel: Postsurgical changes compatible with Whipple procedure. There is no bowel dilatation and no focal bowel inflammation. Moderate amount of stool in the distal colon. Vascular/Lymphatic: Atherosclerotic calcifications in the abdominal and iliac arteries. Negative for an aortic aneurysm. No lymph node enlargement in the abdomen or pelvis.  Reproductive: Prostate is unremarkable. Other: No free fluid.  Negative for free air. Musculoskeletal: Disc space narrowing along the posterior aspect of L5-S1. Vacuum disc at L5-S1. IMPRESSION: 1. No acute abnormality in the abdomen or pelvis. 2. Postsurgical changes compatible with history of Whipple procedure. Electronically Signed   By: Markus Daft M.D.   On: 09/15/2018 19:11    EKG: Independently reviewed.  Sinus tachycardia with biatrial enlargement.  Assessment/Plan Principal Problem:   Nausea & vomiting Active Problems:   Exocrine pancreatic insufficiency (HCC)   HTN (hypertension)   Chronic pancreatitis ([place])    1. Intractable nausea vomiting with previous history of chronic pancreatitis and history of Whipple's procedure presently CAT scan showing nothing acute.  Labs also does not show any indication of having pancreatitis.  We will keep patient on clear liquids diet antiemetics IV fluids pain relief medications.  If patient tolerates diet slowly advance diet. 2. Mild hyperkalemia which improved with fluids.  Follow metabolic panel. 3. Hypertension uncontrolled likely from pain.  In addition to patient's amlodipine I have placed patient on PRN IV hydralazine. 4. Hyperglycemia check hemoglobin A1c. 5. History of Whipple's procedure.   DVT prophylaxis: Lovenox. Code Status: Full code. Family Communication: Discussed with patient. Disposition Plan: Home. Consults called: None. Admission status: Observation.   Rise Patience MD [facility] Pager [phone].  If 7PM-7AM, please contact night-coverage [email or web address] Password [email or web address]  09/15/2018, 11:16 PM

## 2018-09-16 DIAGNOSIS — K8681 Exocrine pancreatic insufficiency: Secondary | ICD-10-CM

## 2018-09-16 LAB — BASIC METABOLIC PANEL
Anion gap: 9 (ref 5–15)
BUN: 6 mg/dL (ref 6–20)
CO2: 30 mmol/L (ref 22–32)
Calcium: 9.1 mg/dL (ref 8.9–10.3)
Chloride: 99 mmol/L (ref 98–111)
Creatinine, Ser: 0.88 mg/dL (ref 0.61–1.24)
Glucose, Bld: 158 mg/dL — ABNORMAL HIGH (ref 70–99)
Potassium: 4.6 mmol/L (ref 3.5–5.1)
Sodium: 138 mmol/L (ref 135–145)

## 2018-09-16 LAB — CBC WITH DIFFERENTIAL/PLATELET
Abs Immature Granulocytes: 0.02 10*3/uL (ref 0.00–0.07)
Basophils Absolute: 0.1 10*3/uL (ref 0.0–0.1)
Basophils Relative: 1 %
Eosinophils Absolute: 0.2 10*3/uL (ref 0.0–0.5)
Eosinophils Relative: 2 %
HCT: 44.4 % (ref 39.0–52.0)
Hemoglobin: 14.2 g/dL (ref 13.0–17.0)
Immature Granulocytes: 0 %
Lymphocytes Relative: 13 %
Lymphs Abs: 1.3 10*3/uL (ref 0.7–4.0)
MCH: 27.3 pg (ref 26.0–34.0)
MCHC: 32 g/dL (ref 30.0–36.0)
MCV: 85.2 fL (ref 80.0–100.0)
Monocytes Absolute: 0.9 10*3/uL (ref 0.1–1.0)
Monocytes Relative: 9 %
Neutro Abs: 7.7 10*3/uL (ref 1.7–7.7)
Neutrophils Relative %: 75 %
Platelets: 249 10*3/uL (ref 150–400)
RBC: 5.21 MIL/uL (ref 4.22–5.81)
RDW: 14.8 % (ref 11.5–15.5)
WBC: 10.1 10*3/uL (ref 4.0–10.5)
nRBC: 0 % (ref 0.0–0.2)

## 2018-09-16 LAB — HEPATIC FUNCTION PANEL
ALT: 20 U/L (ref 0–44)
AST: 27 U/L (ref 15–41)
Albumin: 3.9 g/dL (ref 3.5–5.0)
Alkaline Phosphatase: 108 U/L (ref 38–126)
Bilirubin, Direct: 0.2 mg/dL (ref 0.0–0.2)
Indirect Bilirubin: 1.4 mg/dL — ABNORMAL HIGH (ref 0.3–0.9)
Total Bilirubin: 1.6 mg/dL — ABNORMAL HIGH (ref 0.3–1.2)
Total Protein: 6.6 g/dL (ref 6.5–8.1)

## 2018-09-16 LAB — RAPID URINE DRUG SCREEN, HOSP PERFORMED
Amphetamines: NOT DETECTED
Barbiturates: NOT DETECTED
Benzodiazepines: NOT DETECTED
Cocaine: NOT DETECTED
Opiates: POSITIVE — AB
Tetrahydrocannabinol: NOT DETECTED

## 2018-09-16 LAB — HEMOGLOBIN A1C
Hgb A1c MFr Bld: 5.2 % (ref 4.8–5.6)
Mean Plasma Glucose: 102.54 mg/dL

## 2018-09-16 LAB — HIV ANTIBODY (ROUTINE TESTING W REFLEX): HIV Screen 4th Generation wRfx: NONREACTIVE

## 2018-09-16 LAB — TROPONIN I: Troponin I: <0.03 ng/mL (ref <0.03)

## 2018-09-16 MED ORDER — ADULT MULTIVITAMIN W/MINERALS CH
1.0000 | ORAL_TABLET | Freq: Every day | ORAL | Status: DC
  Administered 2018-09-17: 1 via ORAL
  Filled 2018-09-16: qty 1

## 2018-09-16 MED ORDER — BOOST / RESOURCE BREEZE PO LIQD CUSTOM: 1.0000 | Freq: Three times a day (TID) | ORAL | Status: DC

## 2018-09-16 MED ORDER — AMLODIPINE BESYLATE 2.5 MG PO TABS
5.0000 mg | ORAL_TABLET | Freq: Every day | ORAL | Status: DC
  Administered 2018-09-16 – 2018-09-17 (×2): 5 mg via ORAL
  Filled 2018-09-16 (×2): qty 2

## 2018-09-16 NOTE — Progress Notes (Signed)
Held 5 pm creon- patient vomited up small amount of his meal he tried to eat.

## 2018-09-16 NOTE — Plan of Care (Signed)
  Problem: Safety: Goal: Ability to remain free from injury will improve Outcome: Progressing   

## 2018-09-16 NOTE — Care Management Note (Signed)
Case Management Note  Patient Details  Name: Brent Rogers MRN: [id number] Date of Birth: 1966-04-28  Subjective/Objective:   Nausea and Vomiting                Action/Plan: Patient lives at home; he goes to the [facility] and he gets medication there also. He has applied for Medicaid and working on getting Disability; CM will continue to follow for progression of care.  Expected Discharge Date:  09/17/18 possibly              Expected Discharge Plan:  Home/Self Care  In-House Referral:  Financial Counselor  Discharge planning Services  CM Consult  Status of Service:  In process, will continue to follow  Sherrilyn Rist [phone] 09/16/2018, 11:28 AM

## 2018-09-16 NOTE — Progress Notes (Signed)
PROGRESS NOTE  PERSHING SKIDMORE [id number]:[id number] DOB: [birth year] DOA: 09/15/2018 PCP: Ladell Pier, MD  HPI/Recap of past 24 hours: Brent Rogers is a 52 y.o. 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  Abdominal pain is mostly in the epigastric area radiating across the left side of the abdomen to the back.  Had multiple episodes of vomiting denies any diarrhea or any blood in the vomitus.  Denies chest pain or shortness of breath.  Patient states he drinks alcohol very occasionally.  09/16/2018: Patient seen and examined his bedside.  Reports persistent nausea and vomiting after he eats.  Still requiring IV Zofran.  Assessment/Plan: Principal Problem:   Nausea & vomiting Active Problems:   Exocrine pancreatic insufficiency (HCC)   HTN (hypertension)   Chronic pancreatitis (HCC)  Intractable nausea and vomiting in the setting of history of chronic pancreatitis and Persistent nausea and vomiting in the hospital requiring IV Zofran History of Whipple's procedure Lipase unremarkable CT abdomen pelvis unremarkable Continue to treat symptomatically Denies constipation; afebrile with no leukocytosis Start gentle IV fluid hydration to avoid dehydration  History of chronic pancreatitis Continue Creon  Hypertension Blood pressure well controlled Resume amlodipine 5 mg daily  Chronic anxiety/depression Continue Wellbutrin  Risks: High risk for decompensation due to persistent nausea and vomiting with concern for dehydration in the setting of chronic pancreatitis and advanced age.  Require another midnight for further evaluation and treatment of present condition.   DVT prophylaxis: Lovenox. Code Status: Full code. Family Communication: Discussed with patient. Disposition  Plan: Home. Consults called: None. Admission status: Observation    Objective: Vitals:   09/16/18 0332 09/16/18 0413 09/16/18 0807 09/16/18 1136  BP: (!) 155/100 (!) 141/93 (!) 127/96 129/89  Pulse:  72 76 67  Resp:  18  17  Temp:  98.2 F (36.8 C)  98 F (36.7 C)  TempSrc:  Oral  Oral  SpO2:  100% 98% 100%  Weight: 63 kg     Height: 6\' 3"  (1.905 m)       Intake/Output Summary (Last 24 hours) at 09/16/2018 1528 Last data filed at 09/16/2018 1239 Gross per 24 hour  Intake 3256.85 ml  Output 275 ml  Net 2981.85 ml   Filed Weights   09/15/18 1624 09/16/18 0332  Weight: 65.8 kg 63 kg    Exam:  . General: 52 y.o. year-old male well developed well nourished in no acute distress.  Alert and oriented x3. . Cardiovascular: Regular rate and rhythm with no rubs or gallops.  No thyromegaly or JVD noted.   Marland Kitchen Respiratory: Clear to auscultation with no wheezes or rales. Good inspiratory effort. . Abdomen: Soft nontender nondistended with normal bowel sounds x4 quadrants. . Musculoskeletal: No lower extremity edema. 2/4 pulses in all 4 extremities. . Skin: No ulcerative lesions noted or rashes, . Psychiatry: Mood is appropriate for condition and setting   Data Reviewed: CBC: Recent Labs  Lab 09/15/18 1635 09/16/18 0218  WBC 9.6 10.1  NEUTROABS  --  7.7  HGB 16.7 14.2  HCT 50.4 44.4  MCV 86.0 85.2  PLT 270 093   Basic Metabolic Panel: Recent Labs  Lab 09/15/18 1635 09/15/18 2103 09/16/18 0218  NA 138 139 138  K 5.5* 5.3* 4.6  CL 99 105 99  CO2 21* 22 30  GLUCOSE  168* 124* 158*  BUN 7 8 6   CREATININE 1.04 0.94 0.88  CALCIUM 9.4 8.5* 9.1   GFR: Estimated Creatinine Clearance: 87.6 mL/min (by C-G formula based on SCr of 0.88 mg/dL). Liver Function Tests: Recent Labs  Lab 09/15/18 1635 09/15/18 2103 09/16/18 0218  AST 50* 36 27  ALT 26 20 20   ALKPHOS 112 100 108  BILITOT 1.6* 1.4* 1.6*  PROT 7.3 6.5 6.6  ALBUMIN 4.3 3.8 3.9   Recent Labs  Lab  09/15/18 1635  LIPASE 29   No results for input(s): AMMONIA in the last 168 hours. Coagulation Profile: No results for input(s): INR, PROTIME in the last 168 hours. Cardiac Enzymes: Recent Labs  Lab 09/16/18 0559  TROPONINI <0.03   BNP (last 3 results) No results for input(s): PROBNP in the last 8760 hours. HbA1C: Recent Labs    09/16/18 0559  HGBA1C 5.2   CBG: No results for input(s): GLUCAP in the last 168 hours. Lipid Profile: No results for input(s): CHOL, HDL, LDLCALC, TRIG, CHOLHDL, LDLDIRECT in the last 72 hours. Thyroid Function Tests: No results for input(s): TSH, T4TOTAL, FREET4, T3FREE, THYROIDAB in the last 72 hours. Anemia Panel: No results for input(s): VITAMINB12, FOLATE, FERRITIN, TIBC, IRON, RETICCTPCT in the last 72 hours. Urine analysis:    Component Value Date/Time   COLORURINE YELLOW 09/15/2018 1950   APPEARANCEUR CLEAR 09/15/2018 1950   LABSPEC 1.045 (H) 09/15/2018 1950   PHURINE 6.0 09/15/2018 1950   GLUCOSEU NEGATIVE 09/15/2018 1950   [place] NEGATIVE 09/15/2018 1950   [place] NEGATIVE 09/15/2018 1950   KETONESUR 20 (A) 09/15/2018 1950   PROTEINUR NEGATIVE 09/15/2018 1950   UROBILINOGEN 1.0 06/23/2015 1513   NITRITE NEGATIVE 09/15/2018 1950   LEUKOCYTESUR NEGATIVE 09/15/2018 1950   Sepsis Labs: @LABRCNTIP (procalcitonin:4,lacticidven:4)  ) Recent Results (from the past 240 hour(s))  Culture, blood (routine x 2)     Status: None (Preliminary result)   Collection Time: 09/15/18  6:04 PM  Result Value Ref Range Status   Specimen Description BLOOD LEFT FOREARM  Final   Special Requests   Final    BOTTLES DRAWN AEROBIC ONLY Blood Culture results may not be optimal due to an inadequate volume of blood received in culture bottles   Culture   Final    NO GROWTH < 24 HOURS Performed at [facility], [place] [address], [place], [place] [address]    Report Status PENDING  Incomplete  Culture, blood (routine x 2)     Status: None  (Preliminary result)   Collection Time: 09/15/18  6:10 PM  Result Value Ref Range Status   Specimen Description BLOOD RIGHT HEEL  Final   Special Requests   Final    BOTTLES DRAWN AEROBIC ONLY Blood Culture results may not be optimal due to an inadequate volume of blood received in culture bottles   Culture   Final    NO GROWTH < 24 HOURS Performed at [facility], [place] [address] [place], [place] [address]    Report Status PENDING  Incomplete      Studies: Dg Chest 2 View  Result Date: 09/15/2018 CLINICAL DATA:  Left-sided chest pain and left upper quadrant pain x3 days. EXAM: CHEST - 2 VIEW COMPARISON:  None. FINDINGS: The heart size and mediastinal contours are within normal limits. Both lungs are clear. The visualized skeletal structures are unremarkable. IMPRESSION: No active cardiopulmonary disease. Electronically Signed   By: Ashley Royalty M.D.   On: 09/15/2018 17:53   Ct  Abdomen Pelvis W Contrast  Result Date: 09/15/2018 CLINICAL DATA:  Nausea and vomiting. Left abdominal and chest pain. History of Whipple procedure. EXAM: CT ABDOMEN AND PELVIS WITH CONTRAST TECHNIQUE: Multidetector CT imaging of the abdomen and pelvis was performed using the standard protocol following bolus administration of intravenous contrast. CONTRAST:  115mL OMNIPAQUE IOHEXOL 300 MG/ML  SOLN COMPARISON:  12/01/2017 FINDINGS: Lower chest: Lung bases are clear. Hepatobiliary: Small amount of gas in the biliary system and compatible with prior Whipple procedure. Stable appearance of the liver without suspicious lesion and the main portal venous system is patent. No biliary dilatation. Pancreas: Normal appearance of the mid and distal pancreas without dilatation or inflammatory changes. Spleen: Normal in size without focal abnormality. Adrenals/Urinary Tract: Normal appearance of the adrenal glands. Small hypodensities in the kidneys are compatible with small cysts. No suspicious renal lesions and no  hydronephrosis. Small amount of fluid in the urinary bladder. Stomach/Bowel: Postsurgical changes compatible with Whipple procedure. There is no bowel dilatation and no focal bowel inflammation. Moderate amount of stool in the distal colon. Vascular/Lymphatic: Atherosclerotic calcifications in the abdominal and iliac arteries. Negative for an aortic aneurysm. No lymph node enlargement in the abdomen or pelvis. Reproductive: Prostate is unremarkable. Other: No free fluid.  Negative for free air. Musculoskeletal: Disc space narrowing along the posterior aspect of L5-S1. Vacuum disc at L5-S1. IMPRESSION: 1. No acute abnormality in the abdomen or pelvis. 2. Postsurgical changes compatible with history of Whipple procedure. Electronically Signed   By: Markus Daft M.D.   On: 09/15/2018 19:11    Scheduled Meds: . amLODipine  5 mg Oral Daily  . buPROPion  100 mg Oral BID  . enoxaparin (LOVENOX) injection  40 mg Subcutaneous Q24H  . lipase/protease/amylase  24,000 Units Oral TID AC  . pantoprazole  40 mg Oral Daily  . traZODone  25 mg Oral QHS    Continuous Infusions: . sodium chloride 125 mL/hr at 09/16/18 1236     LOS: 0 days     Kayleen Memos, MD [facility] Pager [phone]  If 7PM-7AM, please contact night-coverage [email or web address] Password [email or web address] 09/16/2018, 3:28 PM

## 2018-09-16 NOTE — Progress Notes (Signed)
Initial Nutrition Assessment  Late Entry September 22, 2018  DOCUMENTATION CODES:   Non-severe (moderate) malnutrition in context of chronic illness, Underweight  INTERVENTION:   -MVI with minerals daily -Boost Breeze po TID, each supplement provides 250 kcal and 9 grams of protein -RD will follow for diet advancement and adjust supplement regimen as appropriate  NUTRITION DIAGNOSIS:   Moderate Malnutrition related to chronic illness(pancreatitis) as evidenced by mild fat depletion, moderate fat depletion, mild muscle depletion, moderate muscle depletion.  GOAL:   Patient will meet greater than or equal to 90% of their needs  MONITOR:   PO intake, Supplement acceptance, Diet advancement, Labs, Weight trends, Skin, I & O's  REASON FOR ASSESSMENT:   Malnutrition Screening Tool    ASSESSMENT:   Brent Rogers is a 52 y.o. 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  Abdominal pain is mostly in the epigastric area radiating across the left side of the abdomen to the back.  Had multiple episodes of vomiting denies any diarrhea or any blood in the vomitus.  Denies chest pain or shortness of breath.  Patient states he drinks alcohol very occasionally.  Pt admitted with intractable nausea and vomiting with previous history of chronic pancreatitis and history of Whipple's procedure.   Spoke with pt at bedside, who had flat affect at time of visit and responded mainly to close ended questions. Pt reports he typically has a hearty appetite, however, has had decreased appetite over the past 3-4 days due to difficulty keeping foods down. Pt reports he continues to have difficulty with keeping foods and liquids down. He reports he was unable to consume breakfast this morning or the gingerale that was  at bedside.   Pt endorses weight loss, but did not provide details to amount or time frame. Per pt, 'I'm always losing weight". Reviewed wt hx; noted pt has experienced a 2.4% wt loss over the past 6 months, which is not significant for time frame.   Labs reviewed.   NUTRITION - FOCUSED PHYSICAL EXAM:    Most Recent Value  Orbital Region  Moderate depletion  Upper Arm Region  Moderate depletion  Thoracic and Lumbar Region  Mild depletion  Buccal Region  No depletion  Temple Region  Mild depletion  Clavicle Bone Region  Moderate depletion  Clavicle and Acromion Bone Region  Moderate depletion  Scapular Bone Region  Moderate depletion  Dorsal Hand  Moderate depletion  Patellar Region  Moderate depletion  Anterior Thigh Region  Moderate depletion  Posterior Calf Region  Moderate depletion  Edema (RD Assessment)  None  Hair  Reviewed  Eyes  Reviewed  Mouth  Reviewed  Skin  Reviewed  Nails  Reviewed       Diet Order:   Diet Order            Diet clear liquid Room service appropriate? Yes; Fluid consistency: Thin  Diet effective now              EDUCATION NEEDS:   Not appropriate for education at this time  Skin:  Skin Assessment: Reviewed RN Assessment  Last BM:  09/15/18  Height:   Ht Readings from Last 1 Encounters:  09-22-18 6\' 3"  (1.905 m)    Weight:   Wt Readings from Last 1 Encounters:  09/17/18 63.9 kg  Ideal Body Weight:  89.1 kg  BMI:  Body mass index is 17.61 kg/m.  Estimated Nutritional Needs:   Kcal:  3536-1443  Protein:  120-135 grams  Fluid:  >2.3 L    Bharat Antillon A. Jimmye Norman, RD, LDN, CDE Pager: [phone] After hours Pager: [phone]

## 2018-09-17 LAB — URINE CULTURE: Culture: <10000 — AB

## 2018-09-17 MED ORDER — ADULT MULTIVITAMIN W/MINERALS CH: 1.0000 | ORAL_TABLET | Freq: Every day | ORAL | 0 refills | Status: DC

## 2018-09-17 MED ORDER — PANCRELIPASE (LIP-PROT-AMYL) 24000-76000 UNITS PO CPEP: 24000.0000 [IU] | ORAL_CAPSULE | Freq: Three times a day (TID) | ORAL | 0 refills | Status: DC

## 2018-09-17 MED ORDER — PANCRELIPASE (LIP-PROT-AMYL) 24000-76000 UNITS PO CPEP: 24000.0000 [IU] | ORAL_CAPSULE | Freq: Three times a day (TID) | ORAL | 0 refills | Status: AC

## 2018-09-17 NOTE — Progress Notes (Signed)
Patient tolerated advanced diet well.  No nausea or vomiting.   Paged MD - aware Ok to move forward with discharge.

## 2018-09-17 NOTE — Discharge Summary (Signed)
Discharge Summary  Brent Rogers EUM:[id number] DOB: October 15, 1965  PCP: Ladell Pier, MD  Admit date: 09/15/2018 Discharge date: 09/17/2018  Time spent: 35 minutes  Recommendations for Outpatient Follow-up:  1. Follow-up with your PCP 2. Follow-up with your GI 3. Take your medications as prescribed  Discharge Diagnoses:  Active Hospital Problems   Diagnosis Date Noted  . Nausea & vomiting 09/15/2018  . Chronic pancreatitis ([place]) 05/01/2017  . HTN (hypertension) 07/10/2015  . Exocrine pancreatic insufficiency ([place]) 12/23/2013    Resolved Hospital Problems  No resolved problems to display.    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 Abdominal pain is mostly in the epigastric area radiating across the left side of the abdomen to the back. Had multiple episodes of vomiting denies any diarrhea or any blood in the vomitus. Denies chest pain or shortness of breath. Patient states he drinks alcohol very occasionally.  Hospital course complicated by persistent nausea and vomiting particularly after eating.  09/17/2018: Patient seen and examined at his bedside.  Reports his symptoms have completely resolved and he is tolerating a clear liquid diet.  Will advance his diet to solid.  If he does okay will discharge to home with close GI follow-up.  He denies abdominal pain or nausea.  Advised to closely follow-up with GI post hospitalization.  Patient understands and agrees to plan.  On the day of discharge, the  patient was hemodynamically stable.     Hospital Course:  Principal Problem:   Nausea & vomiting Active Problems:   Exocrine pancreatic insufficiency (HCC)   HTN (hypertension)   Chronic pancreatitis ([facility])  Intractable nausea and vomiting in the setting of history of chronic pancreatitis  Presented with persistent nausea and vomiting requiring IV Zofran History of Whipple's procedure Lipase unremarkable CT abdomen pelvis unremarkable Continue to treat symptomatically Denies constipation; afebrile with no leukocytosis Avoid dehydration  History of chronic pancreatitis Continue Creon  Hypertension Blood pressure well controlled Continue amlodipine 5 mg daily  Chronic anxiety/depression Continue Wellbutrin   Code Status:Full code.    Discharge Exam: BP (!) 124/97 (BP Location: Right Arm)   Pulse 77   Temp 98.6 F (37 C) (Oral)   Resp 18   Ht 6\' 3"  (1.905 m)   Wt 63.9 kg   SpO2 100%   BMI 17.61 kg/m  . General: 52 y.o. year-old male well developed well nourished in no acute distress.  Alert and oriented x3. . Cardiovascular: Regular rate and rhythm with no rubs or gallops.  No thyromegaly or JVD noted.   Marland Kitchen Respiratory: Clear to auscultation with no wheezes or rales. Good inspiratory effort. . Abdomen: Soft nontender nondistended with normal bowel sounds x4 quadrants. . Musculoskeletal: No lower extremity edema. 2/4 pulses in all 4 extremities. . Skin: No ulcerative lesions noted or rashes, . Psychiatry: Mood is appropriate for condition and setting  Discharge Instructions You were cared for by a hospitalist during your hospital stay. If you have any questions about your discharge medications or the care you  received while you were in the hospital after you are discharged, you can call the unit and asked to speak with the hospitalist on call if the hospitalist that took care of you is not available. Once you are discharged, your primary care physician will  handle any further medical issues. Please note that NO REFILLS for any discharge medications will be authorized once you are discharged, as it is imperative that you return to your primary care physician (or establish a relationship with a primary care physician if you do not have one) for your aftercare needs so that they can reassess your need for medications and monitor your lab values.   Allergies as of 09/17/2018   No Known Allergies     Medication List    STOP taking these medications   acetaminophen 500 MG tablet Commonly known as:  TYLENOL   oxyCODONE-acetaminophen 5-325 MG tablet Commonly known as:  PERCOCET/ROXICET   traMADol 50 MG tablet Commonly known as:  ULTRAM     TAKE these medications   amLODipine 5 MG tablet Commonly known as:  NORVASC Take 1.5 tablets (7.5 mg total) by mouth daily.   bismuth subsalicylate 465 KC/12XN suspension Commonly known as:  PEPTO BISMOL Take 30 mLs by mouth every 6 (six) hours as needed for indigestion.   buPROPion 100 MG 12 hr tablet Commonly known as:  WELLBUTRIN SR Take 1 tablet (100 mg total) by mouth 2 (two) times daily.   methocarbamol 500 MG tablet Commonly known as:  ROBAXIN Take 1 tablet (500 mg total) by mouth every 8 (eight) hours as needed for muscle spasms.   multivitamin with minerals Tabs tablet Take 1 tablet by mouth daily. Start taking on:  September 18, 2018   ondansetron 4 MG disintegrating tablet Commonly known as:  ZOFRAN ODT Take 1 tablet (4 mg total) by mouth every 8 (eight) hours as needed for nausea or vomiting.   Pancrelipase (Lip-Prot-Amyl) 24000-76000 units Cpep Take 1 capsule (24,000 Units total) by mouth 3 (three) times daily before meals. What changed:    how much to take  how to take this  when to take this  additional instructions   pantoprazole 40 MG tablet Commonly known as:  PROTONIX Take 1 tablet (40 mg total) by mouth daily.   promethazine 25 MG tablet Commonly known as:   PHENERGAN TAKE 1 TABLET BY MOUTH EVERY 8 HOURS AS NEEDED FOR NAUSEA OR VOMITING. What changed:  Another medication with the same name was removed. Continue taking this medication, and follow the directions you see here.   traZODone 50 MG tablet Commonly known as:  DESYREL Take 0.5 tablets (25 mg total) by mouth at bedtime.      No Known Allergies Follow-up Information    Schedule an appointment as soon as possible for a visit  with Ladell Pier, MD.   Specialty:  Internal Medicine Contact information: [place] [address] [phone]        Go to  [facility].   Specialty:  Emergency Medicine Why:  As needed, If symptoms worsen Contact information: [address] [id number] [place] [phone]           The results of significant diagnostics from this hospitalization (including imaging, microbiology, ancillary and laboratory) are listed below for reference.    Significant Diagnostic Studies: Dg Chest 2 View  Result Date: 09/15/2018 CLINICAL DATA:  Left-sided chest pain and left upper quadrant pain x3  days. EXAM: CHEST - 2 VIEW COMPARISON:  None. FINDINGS: The heart size and mediastinal contours are within normal limits. Both lungs are clear. The visualized skeletal structures are unremarkable. IMPRESSION: No active cardiopulmonary disease. Electronically Signed   By: Ashley Royalty M.D.   On: 09/15/2018 17:53   Ct Abdomen Pelvis W Contrast  Result Date: 09/15/2018 CLINICAL DATA:  Nausea and vomiting. Left abdominal and chest pain. History of Whipple procedure. EXAM: CT ABDOMEN AND PELVIS WITH CONTRAST TECHNIQUE: Multidetector CT imaging of the abdomen and pelvis was performed using the standard protocol following bolus administration of intravenous contrast. CONTRAST:  137mL OMNIPAQUE IOHEXOL 300 MG/ML  SOLN COMPARISON:  12/01/2017 FINDINGS: Lower chest: Lung bases are  clear. Hepatobiliary: Small amount of gas in the biliary system and compatible with prior Whipple procedure. Stable appearance of the liver without suspicious lesion and the main portal venous system is patent. No biliary dilatation. Pancreas: Normal appearance of the mid and distal pancreas without dilatation or inflammatory changes. Spleen: Normal in size without focal abnormality. Adrenals/Urinary Tract: Normal appearance of the adrenal glands. Small hypodensities in the kidneys are compatible with small cysts. No suspicious renal lesions and no hydronephrosis. Small amount of fluid in the urinary bladder. Stomach/Bowel: Postsurgical changes compatible with Whipple procedure. There is no bowel dilatation and no focal bowel inflammation. Moderate amount of stool in the distal colon. Vascular/Lymphatic: Atherosclerotic calcifications in the abdominal and iliac arteries. Negative for an aortic aneurysm. No lymph node enlargement in the abdomen or pelvis. Reproductive: Prostate is unremarkable. Other: No free fluid.  Negative for free air. Musculoskeletal: Disc space narrowing along the posterior aspect of L5-S1. Vacuum disc at L5-S1. IMPRESSION: 1. No acute abnormality in the abdomen or pelvis. 2. Postsurgical changes compatible with history of Whipple procedure. Electronically Signed   By: Markus Daft M.D.   On: 09/15/2018 19:11    Microbiology: Recent Results (from the past 240 hour(s))  Culture, blood (routine x 2)     Status: None (Preliminary result)   Collection Time: 09/15/18  6:04 PM  Result Value Ref Range Status   Specimen Description BLOOD LEFT FOREARM  Final   Special Requests   Final    BOTTLES DRAWN AEROBIC ONLY Blood Culture results may not be optimal due to an inadequate volume of blood received in culture bottles   Culture   Final    NO GROWTH < 24 HOURS Performed at [facility], [place] [address] [place], [place] [address]    Report Status PENDING  Incomplete  Culture, blood  (routine x 2)     Status: None (Preliminary result)   Collection Time: 09/15/18  6:10 PM  Result Value Ref Range Status   Specimen Description BLOOD RIGHT HEEL  Final   Special Requests   Final    BOTTLES DRAWN AEROBIC ONLY Blood Culture results may not be optimal due to an inadequate volume of blood received in culture bottles   Culture   Final    NO GROWTH < 24 HOURS Performed at [facility], [place] [address] [place], [place] [address]    Report Status PENDING  Incomplete  Urine culture     Status: Abnormal   Collection Time: 09/15/18  7:27 PM  Result Value Ref Range Status   Specimen Description URINE, RANDOM  Final   Special Requests NONE  Final   Culture (A)  Final    <10,000 COLONIES/mL INSIGNIFICANT GROWTH Performed at [facility], [place] [address]  [place], [place] [address]    Report Status 09/17/2018 FINAL  Final     Labs: Basic Metabolic Panel: Recent Labs  Lab 09/15/18 1635 09/15/18 2103 09/16/18 0218  NA 138 139 138  K 5.5* 5.3* 4.6  CL 99 105 99  CO2 21* 22 30  GLUCOSE 168* 124* 158*  BUN 7 8 6   CREATININE 1.04 0.94 0.88  CALCIUM 9.4 8.5* 9.1   Liver Function Tests: Recent Labs  Lab 09/15/18 1635 09/15/18 2103 09/16/18 0218  AST 50* 36 27  ALT 26 20 20   ALKPHOS 112 100 108  BILITOT 1.6* 1.4* 1.6*  PROT 7.3 6.5 6.6  ALBUMIN 4.3 3.8 3.9   Recent Labs  Lab 09/15/18 1635  LIPASE 29   No results for input(s): AMMONIA in the last 168 hours. CBC: Recent Labs  Lab 09/15/18 1635 09/16/18 0218  WBC 9.6 10.1  NEUTROABS  --  7.7  HGB 16.7 14.2  HCT 50.4 44.4  MCV 86.0 85.2  PLT 270 249   Cardiac Enzymes: Recent Labs  Lab 09/16/18 0559  TROPONINI <0.03   BNP: BNP (last 3 results) No results for input(s): BNP in the last 8760 hours.  ProBNP (last 3 results) No results for input(s): PROBNP in the last 8760 hours.  CBG: No results for input(s): GLUCAP in the last 168 hours.     Signed:  Kayleen Memos, MD [facility] 09/17/2018, 10:55 AM

## 2018-09-17 NOTE — Progress Notes (Signed)
Discussed discharge instruction with patient, including medications and follow up appointments.

## 2018-09-17 NOTE — Discharge Instructions (Signed)
Nausea, Adult Nausea is feeling sick to your stomach or feeling that you are about to throw up (vomit). Feeling sick to your stomach is usually not serious, but it may be an early sign of a more serious medical problem. As you feel sicker to your stomach, you may throw up. If you throw up, or if you are not able to drink enough fluids, there is a risk that you may lose too much water in your body (get dehydrated). If you lose too much water in your body, you may:  Feel tired.  Feel thirsty.  Have a dry mouth.  Have cracked lips.  Go pee (urinate) less often. Older adults and people who have other diseases or a weak body defense system (immune system) have a higher risk of losing too much water in the body. The main goals of treating this condition are:  To relieve your nausea.  To ensure your nausea occurs less often.  To prevent throwing up and losing too much fluid. Follow these instructions at home: Watch your symptoms for any changes. Tell your doctor about them. Follow these instructions as told by your doctor. Eating and drinking      Take an ORS (oral rehydration solution). This is a drink that is sold at pharmacies and stores.  Drink clear fluids in small amounts as you are able. These include: ? Water. ? Ice chips. ? Fruit juice that has water added (diluted fruit juice). ? Low-calorie sports drinks.  Eat bland, easy-to-digest foods in small amounts as you are able, such as: ? Bananas. ? Applesauce. ? Rice. ? Low-fat (lean) meats. ? Toast. ? Crackers.  Avoid drinking fluids that have a lot of sugar or caffeine in them. This includes energy drinks, sports drinks, and soda.  Avoid alcohol.  Avoid spicy or fatty foods. General instructions  Take over-the-counter and prescription medicines only as told by your doctor.  Rest at home while you get better.  Drink enough fluid to keep your pee (urine) pale yellow.  Take slow and deep breaths when you feel  sick to your stomach.  Avoid food or things that have strong smells.  Wash your hands often with soap and water. If you cannot use soap and water, use hand sanitizer.  Make sure that all people in your home wash their hands well and often.  Keep all follow-up visits as told by your doctor. This is important. Contact a doctor if:  You feel sicker to your stomach.  You feel sick to your stomach for more than 2 days.  You throw up.  You are not able to drink fluids without throwing up.  You have new symptoms.  You have a fever.  You have a headache.  You have muscle cramps.  You have a rash.  You have pain while peeing.  You feel light-headed or dizzy. Get help right away if:  You have pain in your chest, neck, arm, or jaw.  You feel very weak or you pass out (faint).  You have throw up that is bright red or looks like coffee grounds.  You have bloody or black poop (stools) or poop that looks like tar.  You have a very bad headache, a stiff neck, or both.  You have very bad pain, cramping, or bloating in your belly (abdomen).  You have trouble breathing or you are breathing very quickly.  Your heart is beating very quickly.  Your skin feels cold and clammy.  You feel confused.  You have signs of losing too much water in your body, such as: ? Dark pee, very little pee, or no pee. ? Cracked lips. ? Dry mouth. ? Sunken eyes. ? Sleepiness. ? Weakness. These symptoms may be an emergency. Do not wait to see if the symptoms will go away. Get medical help right away. Call your local emergency services (911 in the [place] Do not drive yourself to the hospital. Summary  Nausea is feeling sick to your stomach or feeling that you are about to throw up (vomit).  If you throw up, or if you are not able to drink enough fluids, there is a risk that you may lose too much water in your body (get dehydrated).  Eat and drink what your doctor tells you. Take  over-the-counter and prescription medicines only as told by your doctor.  Contact a doctor right away if your symptoms get worse or you have new symptoms.  Keep all follow-up visits as told by your doctor. This is important. This information is not intended to replace advice given to you by your health care provider. Make sure you discuss any questions you have with your health care provider. Document Released: 09/04/2011 Document Revised: 02/23/2018 Document Reviewed: 02/23/2018 Elsevier Interactive Patient Education  2019 [employer]   Nausea and Vomiting, Adult Nausea is feeling sick to your stomach or feeling that you are about to throw up (vomit). Vomiting is when food in your stomach is thrown up and out of the mouth. Throwing up can make you feel weak. It can also make you lose too much water in your body (get dehydrated). If you lose too much water in your body, you may:  Feel tired.  Feel thirsty.  Have a dry mouth.  Have cracked lips.  Go pee (urinate) less often. Older adults and people with other diseases or a weak body defense system (immune system) are at higher risk for losing too much water in the body. If you feel sick to your stomach and you throw up, it is important to follow instructions from your doctor about how to take care of yourself. Follow these instructions at home: Watch your symptoms for any changes. Tell your doctor about them. Follow these instructions to care for yourself at home. Eating and drinking      Take an ORS (oral rehydration solution). This is a drink that is sold at pharmacies and stores.  Drink clear fluids in small amounts as you are able, such as: ? Water. ? Ice chips. ? Fruit juice that has water added (diluted fruit juice). ? Low-calorie sports drinks.  Eat bland, easy-to-digest foods in small amounts as you are able, such as: ? Bananas. ? Applesauce. ? Rice. ? Low-fat (lean) meats. ? Toast. ? Crackers.  Avoid drinking  fluids that have a lot of sugar or caffeine in them. This includes energy drinks, sports drinks, and soda.  Avoid alcohol.  Avoid spicy or fatty foods. General instructions  Take over-the-counter and prescription medicines only as told by your doctor.  Drink enough fluid to keep your pee (urine) pale yellow.  Wash your hands often with soap and water. If you cannot use soap and water, use hand sanitizer.  Make sure that all people in your home wash their hands well and often.  Rest at home while you get better.  Watch your condition for any changes.  Take slow and deep breaths when you feel sick to your stomach.  Keep all follow-up visits as told  by your doctor. This is important. Contact a doctor if:  Your symptoms get worse.  You have new symptoms.  You have a fever.  You cannot drink fluids without throwing up.  You feel sick to your stomach for more than 2 days.  You feel light-headed or dizzy.  You have a headache.  You have muscle cramps.  You have a rash.  You have pain while peeing. Get help right away if:  You have pain in your chest, neck, arm, or jaw.  You feel very weak or you pass out (faint).  You throw up again and again.  You have throw up that is bright red or looks like black coffee grounds.  You have bloody or black poop (stools) or poop that looks like tar.  You have a very bad headache, a stiff neck, or both.  You have very bad pain, cramping, or bloating in your belly (abdomen).  You have trouble breathing.  You are breathing very quickly.  Your heart is beating very quickly.  Your skin feels cold and clammy.  You feel confused.  You have signs of losing too much water in your body, such as: ? Dark pee, very little pee, or no pee. ? Cracked lips. ? Dry mouth. ? Sunken eyes. ? Sleepiness. ? Weakness. These symptoms may be an emergency. Do not wait to see if the symptoms will go away. Get medical help right away. Call  your local emergency services (911 in the [place] Do not drive yourself to the hospital. Summary  Nausea is feeling sick to your stomach or feeling that you are about to throw up (vomit). Vomiting is when food in your stomach is thrown up and out of the mouth.  Follow instructions from your doctor about eating and drinking to keep from losing too much water in your body.  Take over-the-counter and prescription medicines only as told by your doctor.  Contact your doctor if your symptoms get worse or you have new symptoms.  Keep all follow-up visits as told by your doctor. This is important. This information is not intended to replace advice given to you by your health care provider. Make sure you discuss any questions you have with your health care provider. Document Released: 03/03/2008 Document Revised: 02/23/2018 Document Reviewed: 02/23/2018 Elsevier Interactive Patient Education  2019 [employer].

## 2018-09-20 LAB — CULTURE, BLOOD (ROUTINE X 2)
Culture: NO GROWTH
Culture: NO GROWTH

## 2018-10-08 ENCOUNTER — Inpatient Hospital Stay: Payer: Self-pay | Admitting: Family Medicine

## 2018-10-28 ENCOUNTER — Ambulatory Visit (HOSPITAL_BASED_OUTPATIENT_CLINIC_OR_DEPARTMENT_OTHER): Payer: Self-pay | Admitting: Licensed Clinical Social Worker

## 2018-10-28 ENCOUNTER — Ambulatory Visit: Payer: Self-pay | Admitting: Internal Medicine

## 2018-10-28 ENCOUNTER — Encounter: Payer: Self-pay | Admitting: Internal Medicine

## 2018-10-28 ENCOUNTER — Ambulatory Visit: Payer: Self-pay | Attending: Internal Medicine | Admitting: Internal Medicine

## 2018-10-28 VITALS — BP 138/86 | HR 74 | Temp 98.1°F | Resp 16 | Wt 149.6 lb

## 2018-10-28 DIAGNOSIS — K219 Gastro-esophageal reflux disease without esophagitis: Secondary | ICD-10-CM

## 2018-10-28 DIAGNOSIS — E440 Moderate protein-calorie malnutrition: Secondary | ICD-10-CM

## 2018-10-28 DIAGNOSIS — Z599 Problem related to housing and economic circumstances, unspecified: Secondary | ICD-10-CM

## 2018-10-28 DIAGNOSIS — K8681 Exocrine pancreatic insufficiency: Secondary | ICD-10-CM

## 2018-10-28 DIAGNOSIS — Z598 Other problems related to housing and economic circumstances: Secondary | ICD-10-CM

## 2018-10-28 DIAGNOSIS — F331 Major depressive disorder, recurrent, moderate: Secondary | ICD-10-CM

## 2018-10-28 DIAGNOSIS — F329 Major depressive disorder, single episode, unspecified: Secondary | ICD-10-CM

## 2018-10-28 DIAGNOSIS — Z1211 Encounter for screening for malignant neoplasm of colon: Secondary | ICD-10-CM

## 2018-10-28 DIAGNOSIS — F32A Depression, unspecified: Secondary | ICD-10-CM

## 2018-10-28 DIAGNOSIS — I10 Essential (primary) hypertension: Secondary | ICD-10-CM

## 2018-10-28 DIAGNOSIS — F419 Anxiety disorder, unspecified: Secondary | ICD-10-CM

## 2018-10-28 DIAGNOSIS — F17200 Nicotine dependence, unspecified, uncomplicated: Secondary | ICD-10-CM

## 2018-10-28 MED ORDER — PANCRELIPASE (LIP-PROT-AMYL) 24000-76000 UNITS PO CPEP: ORAL_CAPSULE | ORAL | 3 refills | Status: DC

## 2018-10-28 MED ORDER — PANTOPRAZOLE SODIUM 40 MG PO TBEC: 40.0000 mg | DELAYED_RELEASE_TABLET | Freq: Every day | ORAL | 5 refills | Status: DC

## 2018-10-28 MED ORDER — TRAZODONE HCL 50 MG PO TABS: 25.0000 mg | ORAL_TABLET | Freq: Every day | ORAL | 2 refills | Status: DC

## 2018-10-28 MED ORDER — BUPROPION HCL ER (SR) 100 MG PO TB12: 100.0000 mg | ORAL_TABLET | Freq: Two times a day (BID) | ORAL | 3 refills | Status: DC

## 2018-10-28 MED ORDER — TRAMADOL HCL 50 MG PO TABS: 50.0000 mg | ORAL_TABLET | Freq: Two times a day (BID) | ORAL | 0 refills | Status: DC | PRN

## 2018-10-28 MED ORDER — AMLODIPINE BESYLATE 5 MG PO TABS: 7.5000 mg | ORAL_TABLET | Freq: Every day | ORAL | 11 refills | Status: DC

## 2018-10-28 MED FILL — PROMETHAZINE 25 MG TABLET: 25 | 6 days supply | Qty: 20 | Fill #0

## 2018-10-28 MED FILL — traMADol HCL 50 MG TABS: 50 | 30 days supply | Qty: 60 | Fill #0

## 2018-10-28 NOTE — Progress Notes (Signed)
Patient ID: ARTEZ REGIS, male    DOB: 1966/09/16  MRN: [id number]  CC: Hospitalization Follow-up   Subjective: Brent Rogers is a 53 y.o. male who presents for hosp f/u His concerns today include:  53 year old male with history of chronic pancreatitiss/p benign pancreatic tumor excision with Whipple procedure,fhx of pancreatic cancer in father,hypertension, GERD, tob dep, depression  Patient hospitalized the middle of last month with intractable nausea vomiting and epigastric pain.  Lipase was normal.  CAT scan of the abdomen showed normal appearance to the mid and distal pancreas without dilation or inflammatory changes.  Labs reveal mild elevation in total bilirubin.  Treated as chronic pancreatitis.  Since discharge patient states that he has been doing somewhat better.  He was down to 140 pounds during hospitalization but now weight has rebounded to 149 pounds.  He is taking Creon 24,000 units with meals.  Some days he gets increase in his abdominal pain and cannot eat as much.  Is requesting refill on tramadol.  Complains of increasing GERD symptoms over the past several weeks.  He has been out of pantoprazole.  Symptoms are worse when he eats spaghetti which he loves.  Depression/Anxiety: Complains of feeling very depressed due to his financial situation.  He had to move out of the apartment that he wants shared with his father.  He is temporarily housed in another apartment.  He is hoping to get disability.  He has been out of Wellbutrin for 2 months.  He has found it helpful and request a refill.    HTN: Out of amlodipine for 2 months.  Needing refill.  He tries to limit salt in the foods.  Tob dep: Down to 5 cigarettes a wk.  He is trying to quit.  Found Wellbutrin helpful in decreasing cravings.   Patient Active Problem List   Diagnosis Date Noted  . Nausea & vomiting 09/15/2018  . Chest pain 12/01/2017  . Pancreatitis 07/07/2017  . Chronic pancreatitis ([place]) 05/01/2017   . Malnutrition of moderate degree 03/20/2017  . Foot ulcer with fat layer exposed ([place])   . Foot callus 12/25/2015  . Onychomycosis of toenail 12/25/2015  . Fatty liver 11/12/2015  . GERD (gastroesophageal reflux disease) 11/12/2015  . HTN (hypertension) 07/10/2015  . S/P cholecystectomy 07/10/2015  . Tobacco abuse 12/26/2014  . Protein-calorie malnutrition, severe ([place]) 11/20/2014  . Exocrine pancreatic insufficiency ([place]) 12/23/2013     Current Outpatient Medications on File Prior to Visit  Medication Sig Dispense Refill  . bismuth subsalicylate (PEPTO BISMOL) 262 MG/15ML suspension Take 30 mLs by mouth every 6 (six) hours as needed for indigestion.    . Multiple Vitamin (MULTIVITAMIN WITH MINERALS) TABS tablet Take 1 tablet by mouth daily. 30 tablet 0  . promethazine (PHENERGAN) 25 MG tablet TAKE 1 TABLET BY MOUTH EVERY 8 HOURS AS NEEDED FOR NAUSEA OR VOMITING. 20 tablet 1   No current facility-administered medications on file prior to visit.     No Known Allergies  Social History   Socioeconomic History  . Marital status: Divorced    Spouse name: Not on file  . Number of children: Not on file  . Years of education: Not on file  . Highest education level: Not on file  Occupational History  . Not on file  Social Needs  . Financial resource strain: Not on file  . Food insecurity:    Worry: Not on file    Inability: Not on file  . Transportation needs:  Medical: Not on file    Non-medical: Not on file  Tobacco Use  . Smoking status: Current Every Day Smoker    Packs/day: 0.25    Years: 30.00    Pack years: 7.50    Types: Cigarettes  . Smokeless tobacco: Former Systems developer    Types: Snuff  . Tobacco comment: "used snuff 10-15 years in my 20s-30s"  Substance and Sexual Activity  . Alcohol use: Yes    Alcohol/week: 4.0 standard drinks    Types: 4 Cans of beer per week    Comment: 05/01/16  . Drug use: No  . Sexual activity: Not Currently    Partners: Male    Lifestyle  . Physical activity:    Days per week: Not on file    Minutes per session: Not on file  . Stress: Not on file  Relationships  . Social connections:    Talks on phone: Not on file    Gets together: Not on file    Attends religious service: Not on file    Active member of club or organization: Not on file    Attends meetings of clubs or organizations: Not on file    Relationship status: Not on file  . Intimate partner violence:    Fear of current or ex partner: Not on file    Emotionally abused: Not on file    Physically abused: Not on file    Forced sexual activity: Not on file  Other Topics Concern  . Not on file  Social History Narrative  . Not on file    Family History  Problem Relation Age of Onset  . Cancer Mother        unsure  . COPD Mother   . Hypertension Mother   . Hypertension Father   . COPD Father   . CAD Sister        possible has stent per pt    Past Surgical History:  Procedure Laterality Date  . BACK SURGERY    . EUS N/A 09/21/2013   Procedure: ESOPHAGEAL ENDOSCOPIC ULTRASOUND (EUS) RADIAL;  Surgeon: Beryle Beams, MD;  Location: [facility];  Service: Endoscopy;  Laterality: N/A;  . EUS N/A 09/30/2013   Procedure: UPPER ENDOSCOPIC ULTRASOUND (EUS) LINEAR;  Surgeon: Beryle Beams, MD;  Location: [facility];  Service: Endoscopy;  Laterality: N/A;  . FINE NEEDLE ASPIRATION N/A 09/21/2013   Procedure: FINE NEEDLE ASPIRATION (FNA) LINEAR;  Surgeon: Beryle Beams, MD;  Location: [facility];  Service: Endoscopy;  Laterality: N/A;  . FINGER FRACTURE SURGERY Left 1995   5th digit  . FRACTURE SURGERY    . KNEE ARTHROSCOPY Bilateral 1987-1989   right-left  . LAPAROSCOPY N/A 12/01/2013   Procedure: LAPAROSCOPY DIAGNOSTIC PANCREATICODUODENECTOMY WITH BILIARY AND PANCREATIC STENTS;  Surgeon: Stark Klein, MD;  Location: [facility];  Service: General;  Laterality: N/A;  . LUMBAR [place] SURGERY  1990's  . WHIPPLE PROCEDURE  12/01/2013    ROS: Review  of Systems Negative except as above. PHYSICAL EXAM: BP 138/86   Pulse 74   Temp 98.1 F (36.7 C) (Oral)   Resp 16   Wt 149 lb 9.6 oz (67.9 kg)   SpO2 100%   BMI 18.70 kg/m   Physical Exam  General appearance - alert, well appearing, and in no distress Mental status - normal mood, behavior, speech, dress, motor activity, and thought processes Mouth - mucous membranes moist, pharynx normal without lesions Chest - clear to auscultation, no wheezes, rales or rhonchi,  symmetric air entry Heart - normal rate, regular rhythm, normal S1, S2, no murmurs, rubs, clicks or gallops Abdomen -scaphoid.  Normal bowel sounds.  Soft slight diffuse tenderness without guarding or rebound Extremities -no lower extremity edema   Chemistry      Component Value Date/Time   NA 138 09/16/2018 0218   K 4.6 09/16/2018 0218   CL 99 09/16/2018 0218   CO2 30 09/16/2018 0218   BUN 6 09/16/2018 0218   CREATININE 0.88 09/16/2018 0218   CREATININE 0.91 10/15/2015 1517      Component Value Date/Time   CALCIUM 9.1 09/16/2018 0218   ALKPHOS 108 09/16/2018 0218   AST 27 09/16/2018 0218   ALT 20 09/16/2018 0218   BILITOT 1.6 (H) 09/16/2018 0218     Lab Results  Component Value Date   WBC 10.1 09/16/2018   HGB 14.2 09/16/2018   HCT 44.4 09/16/2018   MCV 85.2 09/16/2018   PLT 249 09/16/2018   Depression screen PHQ 2/9 10/28/2018 02/23/2018 11/26/2017  Decreased Interest 2 3 2   Down, Depressed, Hopeless 3 3 2   PHQ - 2 Score 5 6 4   Altered sleeping 3 3 3   Tired, decreased energy 3 3 2   Change in appetite 3 3 3   Feeling bad or failure about yourself  2 3 2   Trouble concentrating 1 0 2  Moving slowly or fidgety/restless 3 (No Data) 1  Suicidal thoughts 3 3 0  PHQ-9 Score 23 21 17    GAD 7 : Generalized Anxiety Score 10/28/2018 02/23/2018 11/26/2017 05/01/2017  Nervous, Anxious, on Edge 3 3 2 2   Control/stop worrying 2 3 2 2   Worry too much - different things 3 3 3 2   Trouble relaxing 3 3 3 3   Restless 2 3  2 3   Easily annoyed or irritable 2 1 3 2   Afraid - awful might happen 3 3 3 1   Total GAD 7 Score 18 19 18 15      ASSESSMENT AND PLAN: 1. Depression, unspecified depression type Patient seen by LCSW today.  We will get him back on Wellbutrin. - buPROPion (WELLBUTRIN SR) 100 MG 12 hr tablet; Take 1 tablet (100 mg total) by mouth 2 (two) times daily.  Dispense: 60 tablet; Refill: 3  2. Exocrine pancreatic insufficiency Refill given on tramadol to use as needed.  Controlled substance prescribing agreement was updated 01/2018 [place] reviewed and is appropriate - traMADol (ULTRAM) 50 MG tablet; Take 1 tablet (50 mg total) by mouth every 12 (twelve) hours as needed.  Dispense: 60 tablet; Refill: 0 - Pancrelipase, Lip-Prot-Amyl, (CREON) 24000-76000 units CPEP; 1 tab PO TID with meals  Dispense: 180 capsule; Refill: 3  3. Malnutrition of moderate degree (HCC) Continue Creon.  Encourage him to try meal substitutes like Ensure or boost shakes when he is not able to eat a full meal  4. Essential hypertension Not at goal.  Refill Norvasc - amLODipine (NORVASC) 5 MG tablet; Take 1.5 tablets (7.5 mg total) by mouth daily.  Dispense: 45 tablet; Refill: 11  5. Gastroesophageal reflux disease without esophagitis GERD precautions discussed and encouraged.  Advised to avoid foods that cause flareups including tomato based foods, juices and spicy foods.  Advised to eat his last meal at least 2 to 3 hours before laying down and to sleep with the head slightly elevated. - pantoprazole (PROTONIX) 40 MG tablet; Take 1 tablet (40 mg total) by mouth daily.  Dispense: 30 tablet; Refill: 5  6. Tobacco dependence Advised to quit.  Patient wanting to get back on Wellbutrin.  This was prescribed  7. Colon cancer screening - Fecal occult blood, imunochemical([facility])   Patient was given the opportunity to ask questions.  Patient verbalized understanding of the plan and was able to repeat key elements of  the plan.   Orders Placed This Encounter  Procedures  . Fecal occult blood, imunochemical([facility])     Requested Prescriptions   Signed Prescriptions Disp Refills  . buPROPion (WELLBUTRIN SR) 100 MG 12 hr tablet 60 tablet 3    Sig: Take 1 tablet (100 mg total) by mouth 2 (two) times daily.  . traZODone (DESYREL) 50 MG tablet 30 tablet 2    Sig: Take 0.5 tablets (25 mg total) by mouth at bedtime.  . pantoprazole (PROTONIX) 40 MG tablet 30 tablet 5    Sig: Take 1 tablet (40 mg total) by mouth daily.  Marland Kitchen amLODipine (NORVASC) 5 MG tablet 45 tablet 11    Sig: Take 1.5 tablets (7.5 mg total) by mouth daily.  . traMADol (ULTRAM) 50 MG tablet 60 tablet 0    Sig: Take 1 tablet (50 mg total) by mouth every 12 (twelve) hours as needed.  . Pancrelipase, Lip-Prot-Amyl, (CREON) 24000-76000 units CPEP 180 capsule 3    Sig: 1 tab PO TID with meals    Return in about 3 months (around 01/27/2019).  Karle Plumber, MD, FACP

## 2018-10-29 MED FILL — PANTOPRAZOLE SOD DR 40 MG T: 40 | 30 days supply | Qty: 30 | Fill #0

## 2018-10-29 MED FILL — traZODone HCL 50 MG TABS: 50 | 30 days supply | Qty: 30 | Fill #0

## 2018-10-29 MED FILL — BUPROPION HCL SR 100 MG TAB: 100 | 30 days supply | Qty: 60 | Fill #0

## 2018-10-29 MED FILL — AMLODIPINE BESYLATE 5 MG TA: 5 | 30 days supply | Qty: 45 | Fill #0

## 2018-10-29 NOTE — BH Specialist Note (Signed)
Integrated Behavioral Health Initial Visit  MRN: [id number] Name: Brent Rogers  Number of [place] Clinician visits:: 1/6 Session Start time: 3:00 PM  Session End time: 3:30 PM Total time: 30 minutes  Type of Service: [place] Interpretor:No. Interpretor Name and Language: N/A   Warm Hand Off Completed.       SUBJECTIVE: Brent Rogers is a 53 y.o. male accompanied by self Patient was referred by Dr. Wynetta Emery for depression and anxiety. Patient reports the following symptoms/concerns: Pt reports symptoms of depression and anxiety Duration of problem: Ongoing; Severity of problem: severe  OBJECTIVE: Mood: Depressed and Affect: Appropriate Risk of harm to self or others: No plan to harm self or others Pt scored positive on phq9; however, denies current SI/HI. Protective factors were identified, safety plan discussed, and crisis intervention resources provided  LIFE CONTEXT: Family and Social: Pt shared both parents are deceased. He has siblings that reside locally School/Work: Pt has no insurance or income. He has been denied disability since 2015 and receives food stamps Self-Care: Pt participates in medication management (since 2018) Feels like "it's not working) Life Changes: Pt reports symptoms of depression and anxiety  GOALS ADDRESSED: Patient will: 1. Reduce symptoms of: anxiety, depression and stress 2. Increase knowledge and/or ability of: coping skills and healthy habits  3. Demonstrate ability to: Increase healthy adjustment to current life circumstances and Increase adequate support systems for patient/family  INTERVENTIONS: Interventions utilized: Mindfulness or Psychologist, educational, Supportive Counseling and Psychoeducation and/or Health Education  Standardized Assessments completed: GAD-7 and PHQ 2&9 with C-SSRS  ASSESSMENT: Patient currently experiencing depression and anxiety triggered by ongoing  denials from disability. Patient shared inability to work due to ongoing medical conditions. He receives emotional support from family. Pt scored positive on phq9; however, denies current SI/HI. Protective factors were identified, safety plan discussed, and crisis intervention resources provided.   Patient may benefit from psychoeducation and psychotherapy. LCSWA educated pt on the correlation between one's physical and mental health, in addition, to how stress can negatively impact both. Therapeutic interventions were discussed and healthy coping skills were identified to assist with management of symptoms. Pt was strongly encouraged to apply for financial counseling to assist with affording medications.  PLAN: 1. Follow up with behavioral health clinician on : Pt was encouraged to contact LCSWA if symptoms worsen or fail to improve to schedule behavioral appointments at [facility]. 2. Behavioral recommendations: LCSWA recommends that pt apply healthy coping skills discussed. Pt is encouraged to schedule follow up appointment with LCSWA 3. Referral(s): [place] (In Clinic) 4. "From scale of 1-10, how likely are you to follow plan?":   Rebekah Chesterfield, LCSW 10/29/2018 3:10 PM

## 2019-03-15 ENCOUNTER — Emergency Department (HOSPITAL_COMMUNITY)
Admission: EM | Admit: 2019-03-15 | Discharge: 2019-03-15 | Disposition: A | Discharge status: Home / Self Care | Payer: Self-pay | Attending: Emergency Medicine | Admitting: Emergency Medicine

## 2019-03-15 ENCOUNTER — Encounter (HOSPITAL_COMMUNITY): Payer: Self-pay

## 2019-03-15 ENCOUNTER — Other Ambulatory Visit: Payer: Self-pay

## 2019-03-15 DIAGNOSIS — Z79899 Other long term (current) drug therapy: Secondary | ICD-10-CM | POA: Insufficient documentation

## 2019-03-15 DIAGNOSIS — Z8507 Personal history of malignant neoplasm of pancreas: Secondary | ICD-10-CM | POA: Insufficient documentation

## 2019-03-15 DIAGNOSIS — I10 Essential (primary) hypertension: Secondary | ICD-10-CM | POA: Insufficient documentation

## 2019-03-15 DIAGNOSIS — R109 Unspecified abdominal pain: Secondary | ICD-10-CM

## 2019-03-15 DIAGNOSIS — F17210 Nicotine dependence, cigarettes, uncomplicated: Secondary | ICD-10-CM | POA: Insufficient documentation

## 2019-03-15 DIAGNOSIS — R1032 Left lower quadrant pain: Secondary | ICD-10-CM | POA: Insufficient documentation

## 2019-03-15 DIAGNOSIS — R1012 Left upper quadrant pain: Secondary | ICD-10-CM | POA: Insufficient documentation

## 2019-03-15 LAB — COMPREHENSIVE METABOLIC PANEL
ALT: 38 U/L (ref 0–44)
AST: 59 U/L — ABNORMAL HIGH (ref 15–41)
Albumin: 4 g/dL (ref 3.5–5.0)
Alkaline Phosphatase: 114 U/L (ref 38–126)
Anion gap: 10 (ref 5–15)
BUN: 5 mg/dL — ABNORMAL LOW (ref 6–20)
CO2: 23 mmol/L (ref 22–32)
Calcium: 9 mg/dL (ref 8.9–10.3)
Chloride: 107 mmol/L (ref 98–111)
Creatinine, Ser: 0.75 mg/dL (ref 0.61–1.24)
GFR calc Af Amer: >60 mL/min (ref >60)
GFR calc non Af Amer: >60 mL/min (ref >60)
Glucose, Bld: 105 mg/dL — ABNORMAL HIGH (ref 70–99)
Potassium: 4.6 mmol/L (ref 3.5–5.1)
Sodium: 140 mmol/L (ref 135–145)
Total Bilirubin: 0.5 mg/dL (ref 0.3–1.2)
Total Protein: 7.2 g/dL (ref 6.5–8.1)

## 2019-03-15 LAB — CBC WITH DIFFERENTIAL/PLATELET
Abs Immature Granulocytes: 0.03 10*3/uL (ref 0.00–0.07)
Basophils Absolute: 0.1 10*3/uL (ref 0.0–0.1)
Basophils Relative: 2 %
Eosinophils Absolute: 0.2 10*3/uL (ref 0.0–0.5)
Eosinophils Relative: 2 %
HCT: 48.3 % (ref 39.0–52.0)
Hemoglobin: 15.8 g/dL (ref 13.0–17.0)
Immature Granulocytes: 0 %
Lymphocytes Relative: 34 %
Lymphs Abs: 2.5 10*3/uL (ref 0.7–4.0)
MCH: 30.3 pg (ref 26.0–34.0)
MCHC: 32.7 g/dL (ref 30.0–36.0)
MCV: 92.7 fL (ref 80.0–100.0)
Monocytes Absolute: 0.4 10*3/uL (ref 0.1–1.0)
Monocytes Relative: 5 %
Neutro Abs: 4.2 10*3/uL (ref 1.7–7.7)
Neutrophils Relative %: 57 %
Platelets: 225 10*3/uL (ref 150–400)
RBC: 5.21 MIL/uL (ref 4.22–5.81)
RDW: 14.5 % (ref 11.5–15.5)
WBC: 7.4 10*3/uL (ref 4.0–10.5)
nRBC: 0 % (ref 0.0–0.2)

## 2019-03-15 LAB — LACTIC ACID, PLASMA
Lactic Acid, Venous: 2.1 mmol/L (ref 0.5–1.9)
Lactic Acid, Venous: 1.9 mmol/L (ref 0.5–1.9)

## 2019-03-15 LAB — LIPASE, BLOOD: Lipase: 20 U/L (ref 11–51)

## 2019-03-15 MED ORDER — ONDANSETRON HCL 4 MG PO TABS: 4.0000 mg | ORAL_TABLET | Freq: Three times a day (TID) | ORAL | 0 refills | Status: AC | PRN

## 2019-03-15 MED ORDER — OXYCODONE-ACETAMINOPHEN 5-325 MG PO TABS
1.0000 | ORAL_TABLET | Freq: Once | ORAL | Status: AC
  Administered 2019-03-15: 1 via ORAL
  Filled 2019-03-15: qty 1

## 2019-03-15 MED ORDER — SODIUM CHLORIDE 0.9 % IV BOLUS
1000.0000 mL | Freq: Once | INTRAVENOUS | Status: AC
  Administered 2019-03-15: 1000 mL via INTRAVENOUS

## 2019-03-15 MED ORDER — ONDANSETRON HCL 4 MG/2ML IJ SOLN
4.0000 mg | Freq: Once | INTRAMUSCULAR | Status: AC
  Administered 2019-03-15: 4 mg via INTRAVENOUS
  Filled 2019-03-15: qty 2

## 2019-03-15 MED ORDER — ALUM & MAG HYDROXIDE-SIMETH 200-200-20 MG/5ML PO SUSP
30.0000 mL | Freq: Once | ORAL | Status: AC
  Administered 2019-03-15: 30 mL via ORAL
  Filled 2019-03-15: qty 30

## 2019-03-15 MED ORDER — HYDROMORPHONE HCL 1 MG/ML IJ SOLN
1.0000 mg | Freq: Once | INTRAMUSCULAR | Status: AC
  Administered 2019-03-15: 1 mg via INTRAVENOUS
  Filled 2019-03-15: qty 1

## 2019-03-15 MED ORDER — LIDOCAINE VISCOUS HCL 2 % MT SOLN
15.0000 mL | Freq: Once | OROMUCOSAL | Status: AC
  Administered 2019-03-15: 15 mL via ORAL
  Filled 2019-03-15: qty 15

## 2019-03-15 NOTE — ED Notes (Signed)
Pt d/c home per MD order. Discharge summary reviewed with pt. Pt verbalizes understanding. Ambulatory off unit. No s/s of acute distress noted.

## 2019-03-15 NOTE — ED Provider Notes (Signed)
[facility] DEPT Provider Note  CSN: [id number] Arrival date & time: 03/15/19 2706  Chief Complaint(s) Pancreatitis  HPI Brent ALOISI is a 53 y.o. male   The history is provided by the patient.  Abdominal Pain Pain location:  Epigastric Pain severity:  Severe Onset quality:  Gradual Duration:  1 week Timing:  Constant Progression:  Worsening Chronicity:  Recurrent Context: alcohol use (had beer yesterday) and eating   Context comment:  Chronic alcohol Relieved by:  Eating Worsened by:  Nothing Ineffective treatments:  Liquids Associated symptoms: diarrhea, fatigue, nausea and vomiting   Associated symptoms: no chest pain, no chills and no fever  Hematochezia: NBNB.     Past Medical History Past Medical History:  Diagnosis Date  . Benign tumor of endocrine pancreas   . Chronic pancreatitis ([place])    S/P Whipple  . Hypertension   . Migraine    "@ least once/month" (11/12/2015)  . Pancreatic abnormality    CT  shows mass  . Pneumonia 11/12/2015  . Scoliosis    Patient Active Problem List   Diagnosis Date Noted  . Nausea & vomiting 09/15/2018  . Chest pain 12/01/2017  . Pancreatitis 07/07/2017  . Chronic pancreatitis ([place]) 05/01/2017  . Malnutrition of moderate degree 03/20/2017  . Foot ulcer with fat layer exposed ([place])   . Foot callus 12/25/2015  . Onychomycosis of toenail 12/25/2015  . Fatty liver 11/12/2015  . GERD (gastroesophageal reflux disease) 11/12/2015  . HTN (hypertension) 07/10/2015  . S/P cholecystectomy 07/10/2015  . Tobacco abuse 12/26/2014  . Protein-calorie malnutrition, severe ([place]) 11/20/2014  . Exocrine pancreatic insufficiency ([place]) 12/23/2013   Home Medication(s) Prior to Admission medications   Medication Sig Start Date End Date Taking? Authorizing Provider  acetaminophen (TYLENOL) 500 MG tablet Take 1,000 mg by mouth every 6 (six) hours as needed for mild pain.   Yes [provider]   amLODipine (NORVASC) 5 MG tablet Take 1.5 tablets (7.5 mg total) by mouth daily. 10/28/18  Yes Ladell Pier, MD  bismuth subsalicylate (PEPTO BISMOL) 262 MG/15ML suspension Take 30 mLs by mouth every 6 (six) hours as needed for indigestion.   Yes [provider]  buPROPion (WELLBUTRIN SR) 100 MG 12 hr tablet Take 1 tablet (100 mg total) by mouth 2 (two) times daily. 10/28/18  Yes Ladell Pier, MD  Multiple Vitamin (MULTIVITAMIN WITH MINERALS) TABS tablet Take 1 tablet by mouth daily. 09/18/18  Yes Hall, Carole N, DO  Pancrelipase, Lip-Prot-Amyl, (CREON) 24000-76000 units CPEP 1 tab PO TID with meals Patient taking differently: Take 1 capsule by mouth 3 (three) times daily.  10/28/18  Yes Ladell Pier, MD  pantoprazole (PROTONIX) 40 MG tablet Take 1 tablet (40 mg total) by mouth daily. 10/28/18  Yes Ladell Pier, MD  promethazine (PHENERGAN) 25 MG tablet TAKE 1 TABLET BY MOUTH EVERY 8 HOURS AS NEEDED FOR NAUSEA OR VOMITING. Patient taking differently: Take 25 mg by mouth every 8 (eight) hours as needed for nausea.  05/14/18  Yes Ladell Pier, MD  traMADol (ULTRAM) 50 MG tablet Take 1 tablet (50 mg total) by mouth every 12 (twelve) hours as needed. Patient taking differently: Take 50 mg by mouth every 12 (twelve) hours as needed for moderate pain.  10/28/18  Yes Ladell Pier, MD  traZODone (DESYREL) 50 MG tablet Take 0.5 tablets (25 mg total) by mouth at bedtime. 10/28/18  Yes Ladell Pier, MD  Past Surgical History Past Surgical History:  Procedure Laterality Date  . BACK SURGERY    . EUS N/A 09/21/2013   Procedure: ESOPHAGEAL ENDOSCOPIC ULTRASOUND (EUS) RADIAL;  Surgeon: Beryle Beams, MD;  [facility];  Service: Endoscopy;  Laterality: N/A;  . EUS N/A 09/30/2013   Procedure: UPPER ENDOSCOPIC ULTRASOUND (EUS)  LINEAR;  Surgeon: Beryle Beams, MD;  [facility];  Service: Endoscopy;  Laterality: N/A;  . FINE NEEDLE ASPIRATION N/A 09/21/2013   Procedure: FINE NEEDLE ASPIRATION (FNA) LINEAR;  Surgeon: Beryle Beams, MD;  [facility];  Service: Endoscopy;  Laterality: N/A;  . FINGER FRACTURE SURGERY Left 1995   5th digit  . FRACTURE SURGERY    . KNEE ARTHROSCOPY Bilateral 1987-1989   right-left  . LAPAROSCOPY N/A 12/01/2013   Procedure: LAPAROSCOPY DIAGNOSTIC PANCREATICODUODENECTOMY WITH BILIARY AND PANCREATIC STENTS;  Surgeon: Stark Klein, MD;  Location[facility];  Service: General;  Laterality: N/A;  . LUMBAR [place] SURGERY  1990's  . WHIPPLE PROCEDURE  12/01/2013   Family History Family History  Problem Relation Age of Onset  . Cancer Mother        unsure  . COPD Mother   . Hypertension Mother   . Hypertension Father   . COPD Father   . CAD Sister        possible has stent per pt    Social History Social History   Tobacco Use  . Smoking status: Current Every Day Smoker    Packs/day: 0.25    Years: 30.00    Pack years: 7.50    Types: Cigarettes  . Smokeless tobacco: Former Systems developer    Types: Snuff  . Tobacco comment: "used snuff 10-15 years in my 20s-30s"  Substance Use Topics  . Alcohol use: Yes    Alcohol/week: 4.0 standard drinks    Types: 4 Cans of beer per week    Comment: 05/01/16  . Drug use: No   Allergies Patient has no known allergies.  Review of Systems Review of Systems  Constitutional: Positive for fatigue. Negative for chills and fever.  Cardiovascular: Negative for chest pain.  Gastrointestinal: Positive for abdominal pain, diarrhea, nausea and vomiting. Hematochezia: NBNB.   All other systems are reviewed and are negative for acute change except as noted in the HPI  Physical Exam Vital Signs  I have reviewed the triage vital signs BP (!) 133/96 (BP Location: Right Arm)   Pulse 79   Temp 98.2 F (36.8 C)   Resp 18   Ht 6\' 3"  (1.905 m)    Wt 68 kg   SpO2 100%   BMI 18.75 kg/m   Physical Exam Vitals signs reviewed.  Constitutional:      General: He is not in acute distress.    Appearance: He is well-developed. He is not diaphoretic.  HENT:     Head: Normocephalic and atraumatic.     Jaw: No trismus.     Right Ear: External ear normal.     Left Ear: External ear normal.     Nose: Nose normal.  Eyes:     General: No scleral icterus.    Conjunctiva/sclera: Conjunctivae normal.  Neck:     Musculoskeletal: Normal range of motion.     Trachea: Phonation normal.  Cardiovascular:     Rate and Rhythm: Normal rate and regular rhythm.  Pulmonary:     Effort: Pulmonary effort is normal. No respiratory distress.     Breath sounds: No stridor.  Abdominal:  General: There is no distension.     Tenderness: There is abdominal tenderness in the left upper quadrant and left lower quadrant. There is no guarding or rebound.    Musculoskeletal: Normal range of motion.  Neurological:     Mental Status: He is alert and oriented to person, place, and time.  Psychiatric:        Behavior: Behavior normal.     ED Results and Treatments Labs (all labs ordered are listed, but only abnormal results are displayed) Labs Reviewed  COMPREHENSIVE METABOLIC PANEL - Abnormal; Notable for the following components:      Result Value   Glucose, Bld 105 (*)    BUN 5 (*)    AST 59 (*)    All other components within normal limits  LACTIC ACID, PLASMA - Abnormal; Notable for the following components:   Lactic Acid, Venous 2.1 (*)    All other components within normal limits  CBC WITH DIFFERENTIAL/PLATELET  LIPASE, BLOOD  LACTIC ACID, PLASMA  LACTIC ACID, PLASMA                                                                                                                         EKG  EKG Interpretation  Date/Time:    Ventricular Rate:    PR Interval:    QRS Duration:   QT Interval:    QTC Calculation:   R Axis:     Text  Interpretation:        Radiology No results found. Pertinent labs & imaging results that were available during my care of the patient were reviewed by me and considered in my medical decision making (see chart for details).  Medications Ordered in ED Medications  sodium chloride 0.9 % bolus 1,000 mL (0 mLs Intravenous Stopped 03/15/19 0550)  ondansetron (ZOFRAN) injection 4 mg (4 mg Intravenous Given 03/15/19 0440)  HYDROmorphone (DILAUDID) injection 1 mg (1 mg Intravenous Given 03/15/19 0441)  alum & mag hydroxide-simeth (MAALOX/MYLANTA) 200-200-20 MG/5ML suspension 30 mL (30 mLs Oral Given 03/15/19 0459)    And  lidocaine (XYLOCAINE) 2 % viscous mouth solution 15 mL (15 mLs Oral Given 03/15/19 0459)  sodium chloride 0.9 % bolus 1,000 mL (1,000 mLs Intravenous New Bag/Given 03/15/19 0646)  oxyCODONE-acetaminophen (PERCOCET/ROXICET) 5-325 MG per tablet 1 tablet (1 tablet Oral Given 03/15/19 0645)  Procedures Procedures  (including critical care time)  Medical Decision Making / ED Course I have reviewed the nursing notes for this encounter and the patient's prior records (if available in EHR or on provided paperwork).    Similar pain to prior pancreatitis flares. Left side abd tenderness. Not peritonitic. Labs reassuring w/o lwukocytosis or anemia. No renal insufficiency or electrolyte derangements. No biliary obstruction or elevated lipase.   Provided with IVF and IV pain meds. Also give gi cocktail.   Lactic mildly elevated. Likely from recent emesis/dehydration.   No emesis while in the ED. Transitioned to oral pain meds. Will recheck lactic.  Patient care turned over to Dr Ronnald Nian at [facility]. Patient case and results discussed in detail; please see their note for further ED managment.       Final Clinical Impression(s) / ED Diagnoses Final diagnoses:   Left sided abdominal pain      This chart was dictated using voice recognition software.  Despite best efforts to proofread,  errors can occur which can change the documentation meaning.   Fatima Blank, MD 03/15/19 [phone]

## 2019-03-15 NOTE — ED Provider Notes (Signed)
Assumed care from Dr. Leonette Monarch at 7 AM.  Patient appears to hydrated.  History of pancreatic cancer.  Lab work overall reassuring.  Lipase normal.  No flare for pancreatitis.  Likely chronic pain in origin.  Had mildly elevated lactic acid at 2.1.  Patient given GI cocktail, pain medication, antiemetics and is feeling better.  Getting IV fluids and will recheck lactic acid.  Upon my reevaluation patient feels better and has been tolerating p.o.  Will likely discharge with Zofran.  Patient with improved lactic acid.  Given prescription for Zofran.  Recommend return to the ED symptoms worsen.   Lennice Sites, DO 03/15/19 [phone]

## 2019-03-15 NOTE — ED Triage Notes (Addendum)
Pt BIB [facility] from home. Pt c/o flare up of his pancreatitis. Pt reports N/V/D x3 days, chills and dizziness. Pt reports he was seen in April for same.

## 2019-03-15 NOTE — ED Notes (Signed)
Bed: CH85 Expected date:  Expected time:  Means of arrival:  Comments: 53 yo M/ Pacreatitis

## 2019-04-20 ENCOUNTER — Other Ambulatory Visit: Payer: Self-pay | Admitting: Internal Medicine

## 2019-04-20 DIAGNOSIS — K8681 Exocrine pancreatic insufficiency: Secondary | ICD-10-CM

## 2019-04-20 MED FILL — AMLODIPINE BESYLATE 5 MG TA: 5 | 30 days supply | Qty: 45 | Fill #0

## 2019-04-20 MED FILL — PROMETHAZINE 25 MG TABLET: 25 | 6 days supply | Qty: 20 | Fill #0

## 2019-04-22 ENCOUNTER — Other Ambulatory Visit: Payer: Self-pay

## 2019-04-22 ENCOUNTER — Emergency Department (HOSPITAL_COMMUNITY)
Admission: EM | Admit: 2019-04-22 | Discharge: 2019-04-22 | Disposition: A | Discharge status: Home / Self Care | Payer: Self-pay | Attending: Emergency Medicine | Admitting: Emergency Medicine

## 2019-04-22 ENCOUNTER — Emergency Department (HOSPITAL_COMMUNITY): Payer: Self-pay

## 2019-04-22 DIAGNOSIS — R109 Unspecified abdominal pain: Secondary | ICD-10-CM

## 2019-04-22 DIAGNOSIS — I10 Essential (primary) hypertension: Secondary | ICD-10-CM | POA: Insufficient documentation

## 2019-04-22 DIAGNOSIS — R1012 Left upper quadrant pain: Secondary | ICD-10-CM | POA: Insufficient documentation

## 2019-04-22 DIAGNOSIS — F17210 Nicotine dependence, cigarettes, uncomplicated: Secondary | ICD-10-CM | POA: Insufficient documentation

## 2019-04-22 DIAGNOSIS — R1013 Epigastric pain: Secondary | ICD-10-CM | POA: Insufficient documentation

## 2019-04-22 DIAGNOSIS — K859 Acute pancreatitis without necrosis or infection, unspecified: Secondary | ICD-10-CM

## 2019-04-22 DIAGNOSIS — Z79899 Other long term (current) drug therapy: Secondary | ICD-10-CM | POA: Insufficient documentation

## 2019-04-22 LAB — COMPREHENSIVE METABOLIC PANEL
ALT: 47 U/L — ABNORMAL HIGH (ref 0–44)
AST: 71 U/L — ABNORMAL HIGH (ref 15–41)
Albumin: 3.1 g/dL — ABNORMAL LOW (ref 3.5–5.0)
Alkaline Phosphatase: 99 U/L (ref 38–126)
Anion gap: 8 (ref 5–15)
BUN: <5 mg/dL — ABNORMAL LOW (ref 6–20)
CO2: 22 mmol/L (ref 22–32)
Calcium: 8.2 mg/dL — ABNORMAL LOW (ref 8.9–10.3)
Chloride: 112 mmol/L — ABNORMAL HIGH (ref 98–111)
Creatinine, Ser: 0.79 mg/dL (ref 0.61–1.24)
GFR calc Af Amer: >60 mL/min (ref >60)
GFR calc non Af Amer: >60 mL/min (ref >60)
Glucose, Bld: 97 mg/dL (ref 70–99)
Potassium: 4.1 mmol/L (ref 3.5–5.1)
Sodium: 142 mmol/L (ref 135–145)
Total Bilirubin: 0.2 mg/dL — ABNORMAL LOW (ref 0.3–1.2)
Total Protein: 5.3 g/dL — ABNORMAL LOW (ref 6.5–8.1)

## 2019-04-22 LAB — CBC WITH DIFFERENTIAL/PLATELET
Abs Immature Granulocytes: 0.03 10*3/uL (ref 0.00–0.07)
Basophils Absolute: 0.1 10*3/uL (ref 0.0–0.1)
Basophils Relative: 1 %
Eosinophils Absolute: 0.1 10*3/uL (ref 0.0–0.5)
Eosinophils Relative: 2 %
HCT: 40.5 % (ref 39.0–52.0)
Hemoglobin: 13.3 g/dL (ref 13.0–17.0)
Immature Granulocytes: 0 %
Lymphocytes Relative: 41 %
Lymphs Abs: 2.8 10*3/uL (ref 0.7–4.0)
MCH: 30 pg (ref 26.0–34.0)
MCHC: 32.8 g/dL (ref 30.0–36.0)
MCV: 91.2 fL (ref 80.0–100.0)
Monocytes Absolute: 0.5 10*3/uL (ref 0.1–1.0)
Monocytes Relative: 7 %
Neutro Abs: 3.4 10*3/uL (ref 1.7–7.7)
Neutrophils Relative %: 49 %
Platelets: 189 10*3/uL (ref 150–400)
RBC: 4.44 MIL/uL (ref 4.22–5.81)
RDW: 13.9 % (ref 11.5–15.5)
WBC: 6.9 10*3/uL (ref 4.0–10.5)
nRBC: 0 % (ref 0.0–0.2)

## 2019-04-22 LAB — LIPASE, BLOOD: Lipase: 14 U/L (ref 11–51)

## 2019-04-22 MED ORDER — HYDROMORPHONE HCL 1 MG/ML IJ SOLN
1.0000 mg | Freq: Once | INTRAMUSCULAR | Status: AC
  Administered 2019-04-22: 1 mg via INTRAVENOUS
  Filled 2019-04-22: qty 1

## 2019-04-22 MED ORDER — FAMOTIDINE IN NACL 20-0.9 MG/50ML-% IV SOLN
20.0000 mg | Freq: Once | INTRAVENOUS | Status: AC
  Administered 2019-04-22: 20 mg via INTRAVENOUS
  Filled 2019-04-22: qty 50

## 2019-04-22 MED ORDER — ONDANSETRON HCL 4 MG/2ML IJ SOLN
4.0000 mg | Freq: Once | INTRAMUSCULAR | Status: AC
  Administered 2019-04-22: 4 mg via INTRAVENOUS
  Filled 2019-04-22: qty 2

## 2019-04-22 MED ORDER — PROMETHAZINE HCL 25 MG PO TABS: ORAL_TABLET | ORAL | 1 refills | Status: DC

## 2019-04-22 MED ORDER — LIDOCAINE VISCOUS HCL 2 % MT SOLN
15.0000 mL | Freq: Once | OROMUCOSAL | Status: AC
  Administered 2019-04-22: 15 mL via ORAL
  Filled 2019-04-22: qty 15

## 2019-04-22 MED ORDER — PROMETHAZINE HCL 25 MG/ML IJ SOLN
12.5000 mg | Freq: Once | INTRAMUSCULAR | Status: AC
  Administered 2019-04-22: 12.5 mg via INTRAVENOUS
  Filled 2019-04-22: qty 1

## 2019-04-22 MED ORDER — IOPAMIDOL (ISOVUE-300) INJECTION 61%
100.0000 mL | Freq: Once | INTRAVENOUS | Status: AC | PRN
  Administered 2019-04-22: 100 mL via INTRAVENOUS

## 2019-04-22 MED ORDER — MORPHINE SULFATE (PF) 4 MG/ML IV SOLN
4.0000 mg | Freq: Once | INTRAVENOUS | Status: AC
  Administered 2019-04-22: 4 mg via INTRAVENOUS
  Filled 2019-04-22: qty 1

## 2019-04-22 MED ORDER — SODIUM CHLORIDE 0.9 % IV BOLUS
1000.0000 mL | Freq: Once | INTRAVENOUS | Status: AC
  Administered 2019-04-22: 1000 mL via INTRAVENOUS

## 2019-04-22 MED ORDER — ALUM & MAG HYDROXIDE-SIMETH 200-200-20 MG/5ML PO SUSP
30.0000 mL | Freq: Once | ORAL | Status: AC
  Administered 2019-04-22: 30 mL via ORAL
  Filled 2019-04-22: qty 30

## 2019-04-22 NOTE — ED Provider Notes (Signed)
[facility] EMERGENCY DEPARTMENT Provider Note  CSN: [id number] Arrival date & time: 04/22/19 5830  Chief Complaint(s) Pancreatitis and Chest Pain  HPI Brent Rogers is a 53 y.o. male with a past medical history listed below including pancreatic tumor status post Whipple resulting in chronic pancreatitis who presents to the emergency department with epigastric and left upper quadrant abdominal pain consistent with his pancreatic flares.  Pain began approximately 1 week ago and has been gradually worsening.  He is endorsed 2 days of nausea with nonbloody nonbilious emesis.  Reports that he had 1 beer 2 days ago.  Prior to that he reports having 1 beer on 4 July.  Denied any illicit drug use.  Denies any associated chest pain or shortness of breath.  He does endorse loose bowel movements that are nonbloody.  Denies any other physical complaints.  HPI  Past Medical History Past Medical History:  Diagnosis Date  . Benign tumor of endocrine pancreas   . Chronic pancreatitis ([place])    S/P Whipple  . Hypertension   . Migraine    "@ least once/month" (11/12/2015)  . Pancreatic abnormality    CT  shows mass  . Pneumonia 11/12/2015  . Scoliosis    Patient Active Problem List   Diagnosis Date Noted  . Nausea & vomiting 09/15/2018  . Chest pain 12/01/2017  . Pancreatitis 07/07/2017  . Chronic pancreatitis ([place]) 05/01/2017  . Malnutrition of moderate degree 03/20/2017  . Foot ulcer with fat layer exposed ([place])   . Foot callus 12/25/2015  . Onychomycosis of toenail 12/25/2015  . Fatty liver 11/12/2015  . GERD (gastroesophageal reflux disease) 11/12/2015  . HTN (hypertension) 07/10/2015  . S/P cholecystectomy 07/10/2015  . Tobacco abuse 12/26/2014  . Protein-calorie malnutrition, severe ([place]) 11/20/2014  . Exocrine pancreatic insufficiency ([place]) 12/23/2013   Home Medication(s) Prior to Admission medications   Medication Sig Start Date End Date Taking? Authorizing  Provider  acetaminophen (TYLENOL) 500 MG tablet Take 1,000 mg by mouth every 6 (six) hours as needed for mild pain.    [provider]  amLODipine (NORVASC) 5 MG tablet Take 1.5 tablets (7.5 mg total) by mouth daily. 10/28/18   Ladell Pier, MD  bismuth subsalicylate (PEPTO BISMOL) 262 MG/15ML suspension Take 30 mLs by mouth every 6 (six) hours as needed for indigestion.    [provider]  buPROPion (WELLBUTRIN SR) 100 MG 12 hr tablet Take 1 tablet (100 mg total) by mouth 2 (two) times daily. 10/28/18   Ladell Pier, MD  Multiple Vitamin (MULTIVITAMIN WITH MINERALS) TABS tablet Take 1 tablet by mouth daily. 09/18/18   Kayleen Memos, DO  Pancrelipase, Lip-Prot-Amyl, (CREON) 24000-76000 units CPEP 1 tab PO TID with meals Patient taking differently: Take 1 capsule by mouth 3 (three) times daily.  10/28/18   Ladell Pier, MD  pantoprazole (PROTONIX) 40 MG tablet Take 1 tablet (40 mg total) by mouth daily. 10/28/18   Ladell Pier, MD  promethazine (PHENERGAN) 25 MG tablet TAKE 1 TABLET BY MOUTH EVERY 8 HOURS AS NEEDED FOR NAUSEA OR VOMITING. Patient taking differently: Take 25 mg by mouth every 8 (eight) hours as needed for nausea.  05/14/18   Ladell Pier, MD  traMADol (ULTRAM) 50 MG tablet Take 1 tablet (50 mg total) by mouth every 12 (twelve) hours as needed. Patient taking differently: Take 50 mg by mouth every 12 (twelve) hours as needed for moderate pain.  10/28/18   Ladell Pier, MD  traZODone (DESYREL) 50 MG tablet Take 0.5 tablets (25 mg total) by mouth at bedtime. 10/28/18   Ladell Pier, MD                                                                                                                                    Past Surgical History Past Surgical History:  Procedure Laterality Date  . BACK SURGERY    . EUS N/A 09/21/2013   Procedure: ESOPHAGEAL ENDOSCOPIC ULTRASOUND (EUS) RADIAL;  Surgeon: Beryle Beams, MD;  Location: [facility]  ENDOSCOPY;  Service: Endoscopy;  Laterality: N/A;  . EUS N/A 09/30/2013   Procedure: UPPER ENDOSCOPIC ULTRASOUND (EUS) LINEAR;  Surgeon: Beryle Beams, MD;  Location: [facility] ENDOSCOPY;  Service: Endoscopy;  Laterality: N/A;  . FINE NEEDLE ASPIRATION N/A 09/21/2013   Procedure: FINE NEEDLE ASPIRATION (FNA) LINEAR;  Surgeon: Beryle Beams, MD;  Location: [facility] ENDOSCOPY;  Service: Endoscopy;  Laterality: N/A;  . FINGER FRACTURE SURGERY Left 1995   5th digit  . FRACTURE SURGERY    . KNEE ARTHROSCOPY Bilateral 1987-1989   right-left  . LAPAROSCOPY N/A 12/01/2013   Procedure: LAPAROSCOPY DIAGNOSTIC PANCREATICODUODENECTOMY WITH BILIARY AND PANCREATIC STENTS;  Surgeon: Stark Klein, MD;  Location: [facility] ORS;  Service: General;  Laterality: N/A;  . LUMBAR [place] SURGERY  1990's  . WHIPPLE PROCEDURE  12/01/2013   Family History Family History  Problem Relation Age of Onset  . Cancer Mother        unsure  . COPD Mother   . Hypertension Mother   . Hypertension Father   . COPD Father   . CAD Sister        possible has stent per pt    Social History Social History   Tobacco Use  . Smoking status: Current Every Day Smoker    Packs/day: 0.25    Years: 30.00    Pack years: 7.50    Types: Cigarettes  . Smokeless tobacco: Former Systems developer    Types: Snuff  . Tobacco comment: "used snuff 10-15 years in my 20s-30s"  Substance Use Topics  . Alcohol use: Yes    Alcohol/week: 4.0 standard drinks    Types: 4 Cans of beer per week    Comment: 05/01/16  . Drug use: No   Allergies Patient has no known allergies.  Review of Systems Review of Systems All other systems are reviewed and are negative for acute change except as noted in the HPI  Physical Exam Vital Signs  I have reviewed the triage vital signs BP 126/88   Pulse (!) 49   Temp 98.2 F (36.8 C) (Oral)   Resp 13   Ht 6\' 3"  (1.905 m)   Wt 74.8 kg   SpO2 100%   BMI 20.62 kg/m   Physical Exam Vitals signs reviewed.  Constitutional:       General: He is not in acute distress.    Appearance: He is  well-developed. He is not diaphoretic.  HENT:     Head: Normocephalic and atraumatic.     Jaw: No trismus.     Right Ear: External ear normal.     Left Ear: External ear normal.     Nose: Nose normal.  Eyes:     General: No scleral icterus.    Conjunctiva/sclera: Conjunctivae normal.  Neck:     Musculoskeletal: Normal range of motion.     Trachea: Phonation normal.  Cardiovascular:     Rate and Rhythm: Normal rate and regular rhythm.  Pulmonary:     Effort: Pulmonary effort is normal. No respiratory distress.     Breath sounds: No stridor.  Abdominal:     General: There is no distension.     Tenderness: There is abdominal tenderness in the epigastric area and left upper quadrant. There is guarding. There is no rebound. Negative signs include Murphy's sign.  Musculoskeletal: Normal range of motion.  Neurological:     Mental Status: He is alert and oriented to person, place, and time.  Psychiatric:        Behavior: Behavior normal.     ED Results and Treatments Labs (all labs ordered are listed, but only abnormal results are displayed) Labs Reviewed  COMPREHENSIVE METABOLIC PANEL - Abnormal; Notable for the following components:      Result Value   Chloride 112 (*)    BUN <5 (*)    Calcium 8.2 (*)    Total Protein 5.3 (*)    Albumin 3.1 (*)    AST 71 (*)    ALT 47 (*)    Total Bilirubin 0.2 (*)    All other components within normal limits  LIPASE, BLOOD  CBC WITH DIFFERENTIAL/PLATELET                                                                                                                         EKG  EKG Interpretation  Date/Time:    Ventricular Rate:    PR Interval:    QRS Duration:   QT Interval:    QTC Calculation:   R Axis:     Text Interpretation:        Radiology No results found.  Pertinent labs & imaging results that were available during my care of the patient were reviewed by me  and considered in my medical decision making (see chart for details).  Medications Ordered in ED Medications  sodium chloride 0.9 % bolus 1,000 mL (0 mLs Intravenous Stopped 04/22/19 0554)  ondansetron (ZOFRAN) injection 4 mg (4 mg Intravenous Given 04/22/19 0408)  alum & mag hydroxide-simeth (MAALOX/MYLANTA) 200-200-20 MG/5ML suspension 30 mL (30 mLs Oral Given 04/22/19 0407)    And  lidocaine (XYLOCAINE) 2 % viscous mouth solution 15 mL (15 mLs Oral Given 04/22/19 0407)  famotidine (PEPCID) IVPB 20 mg premix (0 mg Intravenous Stopped 04/22/19 0554)  morphine 4 MG/ML injection 4 mg (4 mg Intravenous Given 04/22/19 0610)  iopamidol (ISOVUE-300) 61 %  injection 100 mL (100 mLs Intravenous Contrast Given 04/22/19 2637)                                                                                                                                    Procedures Procedures  (including critical care time)  Medical Decision Making / ED Course I have reviewed the nursing notes for this encounter and the patient's prior records (if available in EHR or on provided paperwork).   Brent Rogers was evaluated in Emergency Department on 04/22/2019 for the symptoms described in the history of present illness. He was evaluated in the context of the global COVID-19 pandemic, which necessitated consideration that the patient might be at risk for infection with the SARS-CoV-2 virus that causes COVID-19. Institutional protocols and algorithms that pertain to the evaluation of patients at risk for COVID-19 are in a state of rapid change based on information released by regulatory bodies including the CDC and federal and state organizations. These policies and algorithms were followed during the patient's care in the ED.  Epigastric and left upper quadrant abdominal pain History of pancreatic cancer status post Whipple and chronic pancreatitis Endorsed 1 alcoholic 2 days ago. Is having associated nausea and vomiting.  On exam patient has tenderness to palpation to the epigastrium and left upper quadrant.  Positive guarding.  Similar presentation last month when I saw him personally.  Patient improved with IV fluids and pain medicine.  We will treat the same.  Will obtain screening labs and reassess.  Labs reassuring without leukocytosis or anemia.  No significant electrolyte derangements or renal sufficiency.  Patient has mildly elevated LFTs but not consistent with biliary obstruction.  Lipase within normal limits.  After GI cocktail and Pepcid, patient still having pain and tenderness.  Provided with morphine.  On reassessment patient still having pain.  Will obtain a CT scan.  Patient care turned over to Dr Wilson Singer. Patient case and results discussed in detail; please see their note for further ED managment.           This chart was dictated using voice recognition software.  Despite best efforts to proofread,  errors can occur which can change the documentation meaning.   Fatima Blank, MD 04/22/19 [phone]

## 2019-04-22 NOTE — ED Triage Notes (Addendum)
Pt BIB [facility] d/t Cent. CP radiates to back and Chronic Pancreatitis flare up w/ N/V/D   Given 170mcg Fent, 800cc NSB, 4mg  Zofran

## 2019-04-22 NOTE — ED Notes (Signed)
Pt ambulatory at discharge, refused wheelchair transport to front lobby. Patient is taking the bus home. Opportunity was provided for questions, he had none. Discharge instructions reviewed.

## 2019-05-02 MED FILL — PROMETHAZINE 25 MG TABLET: 25 | 6 days supply | Qty: 20 | Fill #0

## 2019-05-16 ENCOUNTER — Encounter (HOSPITAL_COMMUNITY): Payer: Self-pay | Admitting: Emergency Medicine

## 2019-05-16 DIAGNOSIS — F17210 Nicotine dependence, cigarettes, uncomplicated: Secondary | ICD-10-CM | POA: Insufficient documentation

## 2019-05-16 DIAGNOSIS — R197 Diarrhea, unspecified: Secondary | ICD-10-CM | POA: Insufficient documentation

## 2019-05-16 DIAGNOSIS — R1084 Generalized abdominal pain: Secondary | ICD-10-CM | POA: Insufficient documentation

## 2019-05-16 DIAGNOSIS — I10 Essential (primary) hypertension: Secondary | ICD-10-CM | POA: Insufficient documentation

## 2019-05-16 DIAGNOSIS — R112 Nausea with vomiting, unspecified: Secondary | ICD-10-CM | POA: Insufficient documentation

## 2019-05-16 MED ORDER — SODIUM CHLORIDE 0.9% FLUSH
3.0000 mL | Freq: Once | INTRAVENOUS | Status: AC
  Administered 2019-05-17: 3 mL via INTRAVENOUS

## 2019-05-16 NOTE — ED Triage Notes (Signed)
Patient here from home with complaints of left sided abd pain that started 1 week ago. Hx of same, chronic pancreatitis.

## 2019-05-17 ENCOUNTER — Emergency Department (HOSPITAL_COMMUNITY)
Admission: EM | Admit: 2019-05-17 | Discharge: 2019-05-17 | Disposition: A | Discharge status: Home / Self Care | Payer: Self-pay | Attending: Emergency Medicine | Admitting: Emergency Medicine

## 2019-05-17 DIAGNOSIS — R1084 Generalized abdominal pain: Secondary | ICD-10-CM

## 2019-05-17 LAB — COMPREHENSIVE METABOLIC PANEL
ALT: 80 U/L — ABNORMAL HIGH (ref 0–44)
AST: 144 U/L — ABNORMAL HIGH (ref 15–41)
Albumin: 4.2 g/dL (ref 3.5–5.0)
Alkaline Phosphatase: 131 U/L — ABNORMAL HIGH (ref 38–126)
Anion gap: 11 (ref 5–15)
BUN: 8 mg/dL (ref 6–20)
CO2: 25 mmol/L (ref 22–32)
Calcium: 9.3 mg/dL (ref 8.9–10.3)
Chloride: 105 mmol/L (ref 98–111)
Creatinine, Ser: 0.79 mg/dL (ref 0.61–1.24)
GFR calc Af Amer: >60 mL/min (ref >60)
GFR calc non Af Amer: >60 mL/min (ref >60)
Glucose, Bld: 93 mg/dL (ref 70–99)
Potassium: 4.2 mmol/L (ref 3.5–5.1)
Sodium: 141 mmol/L (ref 135–145)
Total Bilirubin: 0.5 mg/dL (ref 0.3–1.2)
Total Protein: 7.6 g/dL (ref 6.5–8.1)

## 2019-05-17 LAB — URINALYSIS, ROUTINE W REFLEX MICROSCOPIC
Bilirubin Urine: NEGATIVE
Glucose, UA: NEGATIVE mg/dL
Hgb urine dipstick: NEGATIVE
Ketones, ur: NEGATIVE mg/dL
Leukocytes,Ua: NEGATIVE
Nitrite: NEGATIVE
Protein, ur: NEGATIVE mg/dL
Specific Gravity, Urine: 1.017 (ref 1.005–1.030)
pH: 5 (ref 5.0–8.0)

## 2019-05-17 LAB — CBC
HCT: 45.9 % (ref 39.0–52.0)
Hemoglobin: 14.8 g/dL (ref 13.0–17.0)
MCH: 29.7 pg (ref 26.0–34.0)
MCHC: 32.2 g/dL (ref 30.0–36.0)
MCV: 92 fL (ref 80.0–100.0)
Platelets: 263 10*3/uL (ref 150–400)
RBC: 4.99 MIL/uL (ref 4.22–5.81)
RDW: 14.3 % (ref 11.5–15.5)
WBC: 6.5 10*3/uL (ref 4.0–10.5)
nRBC: 0 % (ref 0.0–0.2)

## 2019-05-17 LAB — LIPASE, BLOOD: Lipase: 16 U/L (ref 11–51)

## 2019-05-17 MED ORDER — MORPHINE SULFATE (PF) 4 MG/ML IV SOLN
8.0000 mg | Freq: Once | INTRAVENOUS | Status: AC
  Administered 2019-05-17: 8 mg via INTRAVENOUS
  Filled 2019-05-17: qty 2

## 2019-05-17 MED ORDER — DIPHENHYDRAMINE HCL 50 MG/ML IJ SOLN
25.0000 mg | Freq: Once | INTRAMUSCULAR | Status: AC
  Administered 2019-05-17: 04:00:00 25 mg via INTRAVENOUS
  Filled 2019-05-17: qty 1

## 2019-05-17 MED ORDER — ONDANSETRON 4 MG PO TBDP: ORAL_TABLET | ORAL | 0 refills | Status: DC

## 2019-05-17 MED ORDER — SODIUM CHLORIDE 0.9 % IV BOLUS
1000.0000 mL | Freq: Once | INTRAVENOUS | Status: AC
  Administered 2019-05-17: 04:00:00 1000 mL via INTRAVENOUS

## 2019-05-17 MED ORDER — FAMOTIDINE IN NACL 20-0.9 MG/50ML-% IV SOLN
20.0000 mg | Freq: Once | INTRAVENOUS | Status: AC
  Administered 2019-05-17: 20 mg via INTRAVENOUS
  Filled 2019-05-17: qty 50

## 2019-05-17 MED ORDER — PROMETHAZINE HCL 25 MG RE SUPP: 25.0000 mg | Freq: Four times a day (QID) | RECTAL | 0 refills | Status: DC | PRN

## 2019-05-17 MED ORDER — OXYCODONE HCL 5 MG PO TABS
10.0000 mg | ORAL_TABLET | Freq: Once | ORAL | Status: AC
  Administered 2019-05-17: 10 mg via ORAL
  Filled 2019-05-17: qty 2

## 2019-05-17 MED ORDER — MORPHINE SULFATE 15 MG PO TABS: 7.5000 mg | ORAL_TABLET | ORAL | 0 refills | Status: DC | PRN

## 2019-05-17 MED ORDER — PROCHLORPERAZINE EDISYLATE 10 MG/2ML IJ SOLN
10.0000 mg | Freq: Once | INTRAMUSCULAR | Status: AC
  Administered 2019-05-17: 04:00:00 10 mg via INTRAVENOUS
  Filled 2019-05-17: qty 2

## 2019-05-17 MED FILL — PROMETHAZINE HCL 25 MG SUPP: 25 | 3 days supply | Qty: 12 | Fill #0

## 2019-05-17 MED FILL — ONDANSETRON ODT 4 MG TABLET: 4 | 3 days supply | Qty: 20 | Fill #0

## 2019-05-17 NOTE — ED Provider Notes (Signed)
[place] DEPT Provider Note   CSN: [id number] Arrival date & time: 05/16/19  2234    History   Chief Complaint Chief Complaint  Patient presents with   Abdominal Pain    HPI Brent Rogers is a 53 y.o. male.     53 yo M with a chief complaint of diffuse abdominal pain.  This been going on for the past week.  We will like the patient's chronic pancreatitis.  He has not been able to keep anything down.  States he has been trying his oral Phenergan tablets without improvement.  No fevers or chills.  Has had a couple loose stools over the past few days.  The history is provided by the patient.  Abdominal Pain Pain location:  Epigastric Pain quality: sharp and shooting   Pain radiates to:  Back Pain severity:  Moderate Onset quality:  Gradual Duration:  1 week Timing:  Constant Progression:  Worsening Chronicity:  Recurrent Relieved by:  Nothing Worsened by:  Nothing Ineffective treatments:  None tried Associated symptoms: diarrhea, nausea and vomiting   Associated symptoms: no chest pain, no chills, no fever and no shortness of breath     Past Medical History:  Diagnosis Date   Benign tumor of endocrine pancreas    Chronic pancreatitis ([facility])    S/P Whipple   Hypertension    Migraine    "@ least once/month" (11/12/2015)   Pancreatic abnormality    CT  shows mass   Pneumonia 11/12/2015   Scoliosis     Patient Active Problem List   Diagnosis Date Noted   Nausea & vomiting 09/15/2018   Chest pain 12/01/2017   Pancreatitis 07/07/2017   Chronic pancreatitis ([place]) 05/01/2017   Malnutrition of moderate degree 03/20/2017   Foot ulcer with fat layer exposed ([place])    Foot callus 12/25/2015   Onychomycosis of toenail 12/25/2015   Fatty liver 11/12/2015   GERD (gastroesophageal reflux disease) 11/12/2015   HTN (hypertension) 07/10/2015   S/P cholecystectomy 07/10/2015   Tobacco abuse 12/26/2014    Protein-calorie malnutrition, severe ([place]) 11/20/2014   Exocrine pancreatic insufficiency ([place]) 12/23/2013    Past Surgical History:  Procedure Laterality Date   BACK SURGERY     EUS N/A 09/21/2013   Procedure: ESOPHAGEAL ENDOSCOPIC ULTRASOUND (EUS) RADIAL;  Surgeon: Beryle Beams, MD;  [facility];  Service: Endoscopy;  Laterality: N/A;   EUS N/A 09/30/2013   Procedure: UPPER ENDOSCOPIC ULTRASOUND (EUS) LINEAR;  Surgeon: Beryle Beams, MD;  Location[facility];  Service: Endoscopy;  Laterality: N/A;   FINE NEEDLE ASPIRATION N/A 09/21/2013   Procedure: FINE NEEDLE ASPIRATION (FNA) LINEAR;  Surgeon: Beryle Beams, MD;  [facility];  Service: Endoscopy;  Laterality: N/A;   FINGER FRACTURE SURGERY Left 1995   5th digit   FRACTURE SURGERY     KNEE ARTHROSCOPY Bilateral 1987-1989   right-left   LAPAROSCOPY N/A 12/01/2013   Procedure: LAPAROSCOPY DIAGNOSTIC PANCREATICODUODENECTOMY WITH BILIARY AND PANCREATIC STENTS;  Surgeon: Stark Klein, MD;  Location: [facility]S;  Service: General;  Laterality: N/A;   LUMBAR [place] SURGERY  1990's   WHIPPLE PROCEDURE  12/01/2013        Home Medications    Prior to Admission medications   Medication Sig Start Date End Date Taking? Authorizing Provider  acetaminophen (TYLENOL) 500 MG tablet Take 1,000 mg by mouth every 6 (six) hours as needed for mild pain.   Yes [provider]  promethazine (PHENERGAN) 25 MG tablet  TAKE 1 TABLET BY MOUTH EVERY 8 HOURS AS NEEDED FOR NAUSEA OR VOMITING. 04/22/19  Yes Virgel Manifold, MD  amLODipine (NORVASC) 5 MG tablet Take 1.5 tablets (7.5 mg total) by mouth daily. Patient not taking: Reported on 05/17/2019 10/28/18   Ladell Pier, MD  buPROPion [facility] SR) 100 MG 12 hr tablet Take 1 tablet (100 mg total) by mouth 2 (two) times daily. Patient not taking: Reported on 05/17/2019 10/28/18   Ladell Pier, MD  morphine (MSIR) 15 MG tablet Take 0.5 tablets (7.5 mg total) by  mouth every 4 (four) hours as needed for severe pain. 05/17/19   Deno Etienne, DO  Multiple Vitamin (MULTIVITAMIN WITH MINERALS) TABS tablet Take 1 tablet by mouth daily. Patient not taking: Reported on 05/17/2019 09/18/18   Kayleen Memos, DO  ondansetron (ZOFRAN ODT) 4 MG disintegrating tablet 4mg  ODT q4 hours prn nausea/vomit 05/17/19   Deno Etienne, DO  Pancrelipase, Lip-Prot-Amyl, (CREON) 24000-76000 units CPEP 1 tab PO TID with meals Patient not taking: Reported on 05/17/2019 10/28/18   Ladell Pier, MD  pantoprazole (PROTONIX) 40 MG tablet Take 1 tablet (40 mg total) by mouth daily. Patient not taking: Reported on 05/17/2019 10/28/18   Ladell Pier, MD  promethazine (PHENERGAN) 25 MG suppository Place 1 suppository (25 mg total) rectally every 6 (six) hours as needed for nausea or vomiting. 05/17/19   Deno Etienne, DO  traMADol (ULTRAM) 50 MG tablet Take 1 tablet (50 mg total) by mouth every 12 (twelve) hours as needed. Patient not taking: Reported on 05/17/2019 10/28/18   Ladell Pier, MD  traZODone (DESYREL) 50 MG tablet Take 0.5 tablets (25 mg total) by mouth at bedtime. Patient not taking: Reported on 05/17/2019 10/28/18   Ladell Pier, MD    Family History Family History  Problem Relation Age of Onset   Cancer Mother        unsure   COPD Mother    Hypertension Mother    Hypertension Father    COPD Father    CAD Sister        possible has stent per pt    Social History Social History   Tobacco Use   Smoking status: Current Every Day Smoker    Packs/day: 0.25    Years: 30.00    Pack years: 7.50    Types: Cigarettes   Smokeless tobacco: Former Systems developer    Types: Snuff   Tobacco comment: "used snuff 10-15 years in my 20s-30s"  Substance Use Topics   Alcohol use: Yes    Alcohol/week: 4.0 standard drinks    Types: 4 Cans of beer per week    Comment: 05/01/16   Drug use: No     Allergies   Patient has no known allergies.   Review of  Systems Review of Systems  Constitutional: Negative for chills and fever.  HENT: Negative for congestion and facial swelling.   Eyes: Negative for discharge and visual disturbance.  Respiratory: Negative for shortness of breath.   Cardiovascular: Negative for chest pain and palpitations.  Gastrointestinal: Positive for abdominal pain, diarrhea, nausea and vomiting.  Musculoskeletal: Negative for arthralgias and myalgias.  Skin: Negative for color change and rash.  Neurological: Negative for tremors, syncope and headaches.  Psychiatric/Behavioral: Negative for confusion and dysphoric mood.     Physical Exam Updated Vital Signs BP (!) 166/111 (BP Location: Right Arm)    Pulse 62    Temp 99 F (37.2 C) (Oral)    Resp 14  Ht 6\' 3"  (1.905 m)    Wt 70.3 kg    SpO2 100%    BMI 19.37 kg/m   Physical Exam Vitals signs and nursing note reviewed.  Constitutional:      Appearance: He is well-developed.     Comments: Cachectic  HENT:     Head: Normocephalic and atraumatic.  Eyes:     Pupils: Pupils are equal, round, and reactive to light.  Neck:     Musculoskeletal: Normal range of motion and neck supple.     Vascular: No JVD.  Cardiovascular:     Rate and Rhythm: Normal rate and regular rhythm.     Heart sounds: No murmur. No friction rub. No gallop.   Pulmonary:     Effort: No respiratory distress.     Breath sounds: No wheezing.  Abdominal:     General: There is no distension.     Tenderness: There is abdominal tenderness (mild diffuse). There is no guarding or rebound.  Musculoskeletal: Normal range of motion.  Skin:    Coloration: Skin is not pale.     Findings: No rash.  Neurological:     Mental Status: He is alert and oriented to person, place, and time.  Psychiatric:        Behavior: Behavior normal.      ED Treatments / Results  Labs (all labs ordered are listed, but only abnormal results are displayed) Labs Reviewed  COMPREHENSIVE METABOLIC PANEL - Abnormal;  Notable for the following components:      Result Value   AST 144 (*)    ALT 80 (*)    Alkaline Phosphatase 131 (*)    All other components within normal limits  LIPASE, BLOOD  CBC  URINALYSIS, ROUTINE W REFLEX MICROSCOPIC    EKG None  Radiology No results found.  Procedures Procedures (including critical care time)  Medications Ordered in ED Medications  sodium chloride flush (NS) 0.9 % injection 3 mL (3 mLs Intravenous Given 05/17/19 0354)  morphine 4 MG/ML injection 8 mg (8 mg Intravenous Given 05/17/19 0345)  prochlorperazine (COMPAZINE) injection 10 mg (10 mg Intravenous Given 05/17/19 0345)  diphenhydrAMINE (BENADRYL) injection 25 mg (25 mg Intravenous Given 05/17/19 0345)  sodium chloride 0.9 % bolus 1,000 mL (1,000 mLs Intravenous New Bag/Given 05/17/19 0346)  oxyCODONE (Oxy IR/ROXICODONE) immediate release tablet 10 mg (10 mg Oral Given 05/17/19 0423)  famotidine (PEPCID) IVPB 20 mg premix (20 mg Intravenous New Bag/Given 05/17/19 0424)     Initial Impression / Assessment and Plan / ED Course  I have reviewed the triage vital signs and the nursing notes.  Pertinent labs & imaging results that were available during my care of the patient were reviewed by me and considered in my medical decision making (see chart for details).        53 yo M with a chief complaints of nausea and vomiting and diarrhea.  Also with upper abdominal pain.  Patient has a history of a pancreatic mass that was removed during Whipple procedure.  He has been seen in the ED off and on for similar complaints.  Mild tenderness diffusely on exam.  Will give IV fluids antiemetics pain medicine and reassess.   Patient is feeling better after antiemetics and pain medicine.  He is now tolerating p.o.  Still has some residual pain.   LFT with mild elevation has had some mild elevation before but today had slightly higher than it has been.  Lipase is normal.  No leukocytosis.  Will discharge the patient  home.  Change his antibiotics to something he can take while he is actively vomiting.  Short course of pain meds.  PCP follow-up.  5:04 AM:  I have discussed the diagnosis/risks/treatment options with the patient and believe the pt to be eligible for discharge home to follow-up with PCP. We also discussed returning to the ED immediately if new or worsening sx occur. We discussed the sx which are most concerning (e.g., sudden worsening pain, fever, inability to tolerate by mouth) that necessitate immediate return. Medications administered to the patient during their visit and any new prescriptions provided to the patient are listed below.  Medications given during this visit Medications  sodium chloride flush (NS) 0.9 % injection 3 mL (3 mLs Intravenous Given 05/17/19 0354)  morphine 4 MG/ML injection 8 mg (8 mg Intravenous Given 05/17/19 0345)  prochlorperazine (COMPAZINE) injection 10 mg (10 mg Intravenous Given 05/17/19 0345)  diphenhydrAMINE (BENADRYL) injection 25 mg (25 mg Intravenous Given 05/17/19 0345)  sodium chloride 0.9 % bolus 1,000 mL (1,000 mLs Intravenous New Bag/Given 05/17/19 0346)  oxyCODONE (Oxy IR/ROXICODONE) immediate release tablet 10 mg (10 mg Oral Given 05/17/19 0423)  famotidine (PEPCID) IVPB 20 mg premix (20 mg Intravenous New Bag/Given 05/17/19 0424)     The patient appears reasonably screen and/or stabilized for discharge and I doubt any other medical condition or other [facility] requiring further screening, evaluation, or treatment in the ED at this time prior to discharge.    Final Clinical Impressions(s) / ED Diagnoses   Final diagnoses:  Generalized abdominal pain    ED Discharge Orders         Ordered    ondansetron (ZOFRAN ODT) 4 MG disintegrating tablet     05/17/19 0500    promethazine (PHENERGAN) 25 MG suppository  Every 6 hours PRN     05/17/19 0500    morphine (MSIR) 15 MG tablet  Every 4 hours PRN     05/17/19 0500           Deno Etienne, DO 05/17/19  [phone]

## 2019-05-17 NOTE — Discharge Instructions (Signed)
Try zantac or pepcid twice a day.  Try to avoid things that may make this worse, most commonly these are spicy foods tomato based products fatty foods chocolate and peppermint.  Alcohol and tobacco can also make this worse.  Return to the emergency department for sudden worsening pain fever or inability to eat or drink. ° °

## 2019-05-30 ENCOUNTER — Inpatient Hospital Stay: Payer: Self-pay | Admitting: Critical Care Medicine

## 2019-06-06 NOTE — Progress Notes (Signed)
Subjective:    Patient ID: Brent Rogers, male    DOB: July 15, 1966, 53 y.o.   MRN: [id number]  53 y.o.M here to establish pos hosp.  Has been to ED July and Aug for abdominal pain.  W/u c/w chronic pancreatitis. S/p whipple procedure with pancreatitis only seen with mass head of pancreas as indication. He patient was seen in the ER twice over the past 2 months.  His last visit with primary care was in January.  He was not able to get back into the office because of [place].  Unfortunately his Wellbutrin and his Creon have run out along with his blood pressure medications  The patient does not have any financial access.  His disability is pending.  He does not have an orange card.  He is self-pay.  Therefore he is not able to afford his medications.  Patient also continues to drink a sixpack of alcohol about every 2 weeks to every 1 week.  He is continues to smoke a pack of cigarettes every week.  The patient has episodes of nausea but no emesis.  He does have diarrhea as he is not taking the Creon.  The pain is in the left lower quadrant is sharp and stabbing in nature.  It has been controlled with tramadol in the past.  He had a pain contract but it ran out in May 2020.  The emergency room gave the patient immediate release morphine.  This did help to some degree.  He also given Zofran which has not been helpful he does prefer promethazine. Patient also complains of increased reflux symptoms and is off his Protonix.  Note the patient did not get a fecal blood occult sample in January.  He is due a flu vaccine.  Also the patient's PHQ 9 score is high today and he does express some suicidal ideation although he is not developed a plan.  He states Wellbutrin is helpful to him and has been out of this.  Past Medical History:  Diagnosis Date  . Benign tumor of endocrine pancreas   . Chronic pancreatitis ([place])    S/P Whipple  . Foot ulcer with fat layer exposed ([place])   . Hypertension   .  Migraine    "@ least once/month" (11/12/2015)  . Pancreatic abnormality    CT  shows mass  . Pneumonia 11/12/2015  . Scoliosis      Family History  Problem Relation Age of Onset  . Cancer Mother        unsure  . COPD Mother   . Hypertension Mother   . Hypertension Father   . COPD Father   . CAD Sister        possible has stent per pt     Social History   Socioeconomic History  . Marital status: Divorced    Spouse name: Not on file  . Number of children: Not on file  . Years of education: Not on file  . Highest education level: Not on file  Occupational History  . Not on file  Social Needs  . Financial resource strain: Not on file  . Food insecurity    Worry: Not on file    Inability: Not on file  . Transportation needs    Medical: Not on file    Non-medical: Not on file  Tobacco Use  . Smoking status: Current Every Day Smoker    Packs/day: 0.25    Years: 30.00    Pack years: 7.50  Types: Cigarettes  . Smokeless tobacco: Former Systems developer    Types: Snuff  . Tobacco comment: "used snuff 10-15 years in my 20s-30s"  Substance and Sexual Activity  . Alcohol use: Yes    Alcohol/week: 4.0 standard drinks    Types: 4 Cans of beer per week    Comment: 05/01/16  . Drug use: No  . Sexual activity: Not Currently    Partners: Male  Lifestyle  . Physical activity    Days per week: Not on file    Minutes per session: Not on file  . Stress: Not on file  Relationships  . Social Herbalist on phone: Not on file    Gets together: Not on file    Attends religious service: Not on file    Active member of club or organization: Not on file    Attends meetings of clubs or organizations: Not on file    Relationship status: Not on file  . Intimate partner violence    Fear of current or ex partner: Not on file    Emotionally abused: Not on file    Physically abused: Not on file    Forced sexual activity: Not on file  Other Topics Concern  . Not on file  Social  History Narrative  . Not on file     No Known Allergies   Outpatient Medications Prior to Visit  Medication Sig Dispense Refill  . acetaminophen (TYLENOL) 500 MG tablet Take 1,000 mg by mouth every 6 (six) hours as needed for mild pain.    . Multiple Vitamin (MULTIVITAMIN WITH MINERALS) TABS tablet Take 1 tablet by mouth daily. (Patient not taking: Reported on 05/17/2019) 30 tablet 0  . amLODipine (NORVASC) 5 MG tablet Take 1.5 tablets (7.5 mg total) by mouth daily. (Patient not taking: Reported on 05/17/2019) 45 tablet 11  . buPROPion (WELLBUTRIN SR) 100 MG 12 hr tablet Take 1 tablet (100 mg total) by mouth 2 (two) times daily. (Patient not taking: Reported on 05/17/2019) 60 tablet 3  . morphine (MSIR) 15 MG tablet Take 0.5 tablets (7.5 mg total) by mouth every 4 (four) hours as needed for severe pain. (Patient not taking: Reported on 06/07/2019) 3 tablet 0  . ondansetron (ZOFRAN ODT) 4 MG disintegrating tablet '4mg'$  ODT q4 hours prn nausea/vomit (Patient not taking: Reported on 06/07/2019) 20 tablet 0  . Pancrelipase, Lip-Prot-Amyl, (CREON) 24000-76000 units CPEP 1 tab PO TID with meals (Patient not taking: Reported on 05/17/2019) 180 capsule 3  . pantoprazole (PROTONIX) 40 MG tablet Take 1 tablet (40 mg total) by mouth daily. (Patient not taking: Reported on 05/17/2019) 30 tablet 5  . promethazine (PHENERGAN) 25 MG suppository Place 1 suppository (25 mg total) rectally every 6 (six) hours as needed for nausea or vomiting. (Patient not taking: Reported on 06/07/2019) 12 each 0  . promethazine (PHENERGAN) 25 MG tablet TAKE 1 TABLET BY MOUTH EVERY 8 HOURS AS NEEDED FOR NAUSEA OR VOMITING. (Patient not taking: Reported on 06/07/2019) 20 tablet 1  . traMADol (ULTRAM) 50 MG tablet Take 1 tablet (50 mg total) by mouth every 12 (twelve) hours as needed. (Patient not taking: Reported on 05/17/2019) 60 tablet 0  . traZODone (DESYREL) 50 MG tablet Take 0.5 tablets (25 mg total) by mouth at bedtime. (Patient not taking:  Reported on 05/17/2019) 30 tablet 2   No facility-administered medications prior to visit.     Review of Systems  Pos in BOLD Constitutional:     weight loss,  night sweats,  Fevers, chills, fatigue, lassitude. HEENT:   No headaches,  Difficulty swallowing,  Tooth/dental problems,  Sore throat,                No sneezing, itching, ear ache, nasal congestion, post nasal drip,   CV:  No chest pain,  Orthopnea, PND, swelling in lower extremities, anasarca, dizziness, palpitations  GI   heartburn, indigestion, abdominal pain, nausea, vomiting, diarrhea, change in bowel habits, loss of appetite  Resp: No shortness of breath with exertion or at rest.  No excess mucus, no productive cough,  No non-productive cough,  No coughing up of blood.  No change in color of mucus.  No wheezing.  No chest wall deformity  Skin: no rash or lesions.  GU: no dysuria, change in color of urine, no urgency or frequency.  No flank pain.  MS:  No joint pain or swelling.  No decreased range of motion.  No back pain.  Psych:  No change in mood or affect. No depression or anxiety.  No memory loss.     Objective:   Physical Exam   Vitals:   06/07/19 1414  BP: (!) 150/101  Pulse: 71  Temp: 98.8 F (37.1 C)  TempSrc: Oral  SpO2: 100%  Weight: 143 lb 3.2 oz (65 kg)  Height: '6\' 3"'$  (1.905 m)   Wt Readings from Last 3 Encounters:  06/07/19 143 lb 3.2 oz (65 kg)  05/16/19 155 lb (70.3 kg)  04/22/19 165 lb (74.8 kg)   Gen: Pleasant, thin  in no distress,  normal affect  ENT: No lesions,  mouth clear,  oropharynx clear, no postnasal drip  Neck: No JVD, no TMG, no carotid bruits  Lungs: No use of accessory muscles, no dullness to percussion, clear without rales or rhonchi  Cardiovascular: RRR, heart sounds normal, no murmur or gallops, no peripheral edema  Abdomen: soft , tender in LLQ area no rebound but does have guarding, no HSM,  BS normal  Musculoskeletal: No deformities, no cyanosis or clubbing   Neuro: alert, non focal  Skin: Warm, no lesions or rashes    CT Abd 03/2019 FINDINGS: Lower chest:  No contributory findings.  Hepatobiliary: No focal liver abnormality.Pneumobilia in the setting of Whipple procedure. No ductal dilatation or evident inflammation  Pancreas: Whipple procedure. No evident inflammation along the remaining gland. Small calcifications seen along the central body; no evident mass.  Spleen: Unremarkable.  Adrenals/Urinary Tract: Negative adrenals. No hydronephrosis or stone. Unremarkable bladder.  Stomach/Bowel: Postoperative upper tract from Whipple procedure. No obstruction or evident inflammation.  Vascular/Lymphatic: No acute vascular abnormality. Scattered calcified atherosclerotic plaque. No mass or adenopathy.  Reproductive:Negative  Other: No ascites or pneumoperitoneum.  Musculoskeletal: No acute abnormalities.  IMPRESSION: 1. No acute finding.  No visible pancreatitis. 2. Whipple procedure.  BMP Latest Ref Rng & Units 05/17/2019 04/22/2019 03/15/2019  Glucose 70 - 99 mg/dL 93 97 105(H)  BUN 6 - 20 mg/dL 8 <5(L) 5(L)  Creatinine 0.61 - 1.24 mg/dL 0.79 0.79 0.75  Sodium 135 - 145 mmol/L 141 142 140  Potassium 3.5 - 5.1 mmol/L 4.2 4.1 4.6  Chloride 98 - 111 mmol/L 105 112(H) 107  CO2 22 - 32 mmol/L '25 22 23  '$ Calcium 8.9 - 10.3 mg/dL 9.3 8.2(L) 9.0   Hepatic Function Latest Ref Rng & Units 05/17/2019 04/22/2019 03/15/2019  Total Protein 6.5 - 8.1 g/dL 7.6 5.3(L) 7.2  Albumin 3.5 - 5.0 g/dL 4.2 3.1(L) 4.0  AST 15 - 41 U/L  144(H) 71(H) 59(H)  ALT 0 - 44 U/L 80(H) 47(H) 38  Alk Phosphatase 38 - 126 U/L 131(H) 99 114  Total Bilirubin 0.3 - 1.2 mg/dL 0.5 0.2(L) 0.5  Bilirubin, Direct 0.0 - 0.2 mg/dL - - -   CBC Latest Ref Rng & Units 05/17/2019 04/22/2019 03/15/2019  WBC 4.0 - 10.5 K/uL 6.5 6.9 7.4  Hemoglobin 13.0 - 17.0 g/dL 14.8 13.3 15.8  Hematocrit 39.0 - 52.0 % 45.9 40.5 48.3  Platelets 150 - 400 K/uL 263 189 225  Lipase  14 and 16     Assessment & Plan:  I personally reviewed all images and lab data in the [facility] system as well as any outside material available during this office visit and agree with the  radiology impressions.   HTN (hypertension) Hypertension not well controlled at this time due to the fact the patient has been out of his amlodipine for several months  Plan to be to resume amlodipine 10 mg daily and refills were sent to our pharmacy  Chronic pancreatitis ([place]) Severe chronic pancreatitis with chronic pain syndrome status post Whipple procedure and residual severe pain postoperatively  The patient has a pain management agreement in existence since 2018 the last one was in place and renewed May 2019 as it has expired  We renewed his pain management agreement today and we will send also a urine drug screen and I have refilled the tramadol 50 mg every 12 hours as needed we gave a count of 60  I also reviewed the [place] drug database and there have been no prescriptions for opiates since Dr. Durenda Age prescription in January 2020  Exocrine pancreatic insufficiency ([place]) Severe pancreatic insufficiency secondary to pancreatectomy in 2015 and lack of access to pancreatic enzyme replacement therapy  Refill on the Creon medication was given today  GERD (gastroesophageal reflux disease) Severe gastroesophageal reflux disease for this we will refill the Protonix at 40 mg daily  Recurrent pancreatitis Recurrent pancreatitis but note recent lipases were normal in the emergency room  The pancreas resection did not show malignancy but chronic pancreatitis in the past in 2015    Chronic pain syndrome Chronic pancreatitis due to chronic pain syndrome  Review chronic pancreatitis assessment  Depression Very high PHQ 9 score with some suggestion of suicidal ideation although today the patient does not express a plan or suicidal ideation  The patient has been off Wellbutrin for several  months and he did well on this in the past so we will refill the Wellbutrin and also reconnect the patient with our licensed clinical social worker Ms. Lewis  Protein-calorie malnutrition, severe ([place]) Severe protein calorie malnutrition to a severe degree   I have recommended nutritional supplements for this patient and to resume the Creon    Tobacco abuse Ongoing tobacco use again have recommended smoking cessation   Brent Rogers was seen today for hospitalization follow-up and medication refill.  Diagnoses and all orders for this visit:  Chronic pancreatitis, unspecified pancreatitis type ([place]) -     Drug Screen 12+Alcohol+CRT, Ur -     Comprehensive metabolic panel -     CBC with Differential/Platelet; Future  History of partial pancreatectomy -     Comprehensive metabolic panel -     CBC with Differential/Platelet; Future  Colon cancer screening -     Fecal occult blood, imunochemical  Protein-calorie malnutrition, severe ([facility]) -     Comprehensive metabolic panel -     CBC with Differential/Platelet; Future  Exocrine pancreatic  insufficiency ([facility]) -     traMADol (ULTRAM) 50 MG tablet; Take 1 tablet (50 mg total) by mouth every 12 (twelve) hours as needed. -     Pancrelipase, Lip-Prot-Amyl, (CREON) 24000-76000 units CPEP; 1 tab PO TID with meals  Essential hypertension -     amLODipine (NORVASC) 10 MG tablet; Take 1 tablet (10 mg total) by mouth daily. -     Comprehensive metabolic panel -     CBC with Differential/Platelet; Future  Acute pancreatitis without infection or necrosis, unspecified pancreatitis type  Malnutrition of moderate degree  Non-intractable vomiting with nausea, unspecified vomiting type  Depression, unspecified depression type -     buPROPion (WELLBUTRIN SR) 100 MG 12 hr tablet; Take 1 tablet (100 mg total) by mouth 2 (two) times daily.  Gastroesophageal reflux disease without esophagitis -     pantoprazole (PROTONIX) 40 MG tablet; Take 1 tablet  (40 mg total) by mouth daily.  Recurrent pancreatitis -     promethazine (PHENERGAN) 25 MG tablet; TAKE 1 TABLET BY MOUTH EVERY 8 HOURS AS NEEDED FOR NAUSEA OR VOMITING.  Exocrine pancreatic insufficiency -     traMADol (ULTRAM) 50 MG tablet; Take 1 tablet (50 mg total) by mouth every 12 (twelve) hours as needed. -     Pancrelipase, Lip-Prot-Amyl, (CREON) 24000-76000 units CPEP; 1 tab PO TID with meals  Chronic pain syndrome -     Drug Screen 12+Alcohol+CRT, Ur  Tobacco abuse   A flu vaccine was given at this visit

## 2019-06-07 ENCOUNTER — Encounter: Payer: Self-pay | Admitting: Critical Care Medicine

## 2019-06-07 ENCOUNTER — Other Ambulatory Visit: Payer: Self-pay

## 2019-06-07 ENCOUNTER — Ambulatory Visit: Payer: Self-pay | Attending: Critical Care Medicine | Admitting: Critical Care Medicine

## 2019-06-07 VITALS — BP 150/101 | HR 71 | Temp 98.8°F | Ht 75.0 in | Wt 143.2 lb

## 2019-06-07 DIAGNOSIS — K8681 Exocrine pancreatic insufficiency: Secondary | ICD-10-CM

## 2019-06-07 DIAGNOSIS — F32A Depression, unspecified: Secondary | ICD-10-CM

## 2019-06-07 DIAGNOSIS — G894 Chronic pain syndrome: Secondary | ICD-10-CM | POA: Insufficient documentation

## 2019-06-07 DIAGNOSIS — E440 Moderate protein-calorie malnutrition: Secondary | ICD-10-CM

## 2019-06-07 DIAGNOSIS — I10 Essential (primary) hypertension: Secondary | ICD-10-CM

## 2019-06-07 DIAGNOSIS — K859 Acute pancreatitis without necrosis or infection, unspecified: Secondary | ICD-10-CM

## 2019-06-07 DIAGNOSIS — E43 Unspecified severe protein-calorie malnutrition: Secondary | ICD-10-CM

## 2019-06-07 DIAGNOSIS — Z90411 Acquired partial absence of pancreas: Secondary | ICD-10-CM | POA: Insufficient documentation

## 2019-06-07 DIAGNOSIS — Z720 Tobacco use: Secondary | ICD-10-CM

## 2019-06-07 DIAGNOSIS — K861 Other chronic pancreatitis: Secondary | ICD-10-CM

## 2019-06-07 DIAGNOSIS — Z1211 Encounter for screening for malignant neoplasm of colon: Secondary | ICD-10-CM

## 2019-06-07 DIAGNOSIS — F329 Major depressive disorder, single episode, unspecified: Secondary | ICD-10-CM

## 2019-06-07 DIAGNOSIS — K219 Gastro-esophageal reflux disease without esophagitis: Secondary | ICD-10-CM

## 2019-06-07 DIAGNOSIS — R112 Nausea with vomiting, unspecified: Secondary | ICD-10-CM

## 2019-06-07 DIAGNOSIS — Z23 Encounter for immunization: Secondary | ICD-10-CM

## 2019-06-07 MED ORDER — BUPROPION HCL ER (SR) 100 MG PO TB12: 100.0000 mg | ORAL_TABLET | Freq: Two times a day (BID) | ORAL | 4 refills | Status: DC

## 2019-06-07 MED ORDER — AMLODIPINE BESYLATE 10 MG PO TABS: 10.0000 mg | ORAL_TABLET | Freq: Every day | ORAL | 4 refills | Status: DC

## 2019-06-07 MED ORDER — CREON 24000-76000 UNITS PO CPEP: ORAL_CAPSULE | ORAL | 3 refills | Status: DC

## 2019-06-07 MED ORDER — TRAMADOL HCL 50 MG PO TABS: 50.0000 mg | ORAL_TABLET | Freq: Two times a day (BID) | ORAL | 0 refills | Status: DC | PRN

## 2019-06-07 MED ORDER — PROMETHAZINE HCL 25 MG PO TABS: ORAL_TABLET | ORAL | 1 refills | Status: DC

## 2019-06-07 MED ORDER — PANTOPRAZOLE SODIUM 40 MG PO TBEC: 40.0000 mg | DELAYED_RELEASE_TABLET | Freq: Every day | ORAL | 5 refills | Status: DC

## 2019-06-07 MED FILL — ?PANTOPRAZOLE SODI DR 40MGT: 40 | 30 days supply | Qty: 30 | Fill #0

## 2019-06-07 MED FILL — AMLODIPINE BESYLATE 10 MG T: 10 | 30 days supply | Qty: 30 | Fill #0

## 2019-06-07 MED FILL — BUPROPION HCL SR 100 MG TAB: 100 | 30 days supply | Qty: 60 | Fill #0

## 2019-06-07 MED FILL — ?PROMETHAZINE HCL 25MG TABS: AMNEAL | 20 days supply | Qty: 60 | Fill #0

## 2019-06-07 MED FILL — traMADol HCL 50 MG TABS: 50 | 30 days supply | Qty: 60 | Fill #0

## 2019-06-07 NOTE — Assessment & Plan Note (Signed)
Severe chronic pancreatitis with chronic pain syndrome status post Whipple procedure and residual severe pain postoperatively  The patient has a pain management agreement in existence since 2018 the last one was in place and renewed May 2019 as it has expired  We renewed his pain management agreement today and we will send also a urine drug screen and I have refilled the tramadol 50 mg every 12 hours as needed we gave a count of 60  I also reviewed the [place] drug database and there have been no prescriptions for opiates since Dr. Durenda Age prescription in January 2020

## 2019-06-07 NOTE — Assessment & Plan Note (Signed)
Ongoing tobacco use again have recommended smoking cessation

## 2019-06-07 NOTE — Assessment & Plan Note (Signed)
Hypertension not well controlled at this time due to the fact the patient has been out of his amlodipine for several months  Plan to be to resume amlodipine 10 mg daily and refills were sent to our pharmacy

## 2019-06-07 NOTE — Assessment & Plan Note (Signed)
Chronic pancreatitis due to chronic pain syndrome  Review chronic pancreatitis assessment

## 2019-06-07 NOTE — Assessment & Plan Note (Signed)
Severe pancreatic insufficiency secondary to pancreatectomy in 2015 and lack of access to pancreatic enzyme replacement therapy  Refill on the Creon medication was given today

## 2019-06-07 NOTE — Assessment & Plan Note (Signed)
Severe protein calorie malnutrition to a severe degree   I have recommended nutritional supplements for this patient and to resume the Creon

## 2019-06-07 NOTE — Patient Instructions (Addendum)
Flu vaccine was given  Refills on all your medications were obtained  A urine drug screen was obtained along with metabolic panel blood counts  Please abstain from all alcohol and begin to reduce her smoking intake  We will have another drug treatment contract re-signed today  A stool card was given to check for blood in the stool  Financial assistance paperwork was given to schedule an appointment with financial counselor please fill out the paperwork first  Return to see Dr. Wynetta Emery in 2 months

## 2019-06-07 NOTE — Assessment & Plan Note (Signed)
Very high PHQ 9 score with some suggestion of suicidal ideation although today the patient does not express a plan or suicidal ideation  The patient has been off Wellbutrin for several months and he did well on this in the past so we will refill the Wellbutrin and also reconnect the patient with our licensed clinical social worker Ms. Bobby Rumpf

## 2019-06-07 NOTE — Assessment & Plan Note (Signed)
Severe gastroesophageal reflux disease for this we will refill the Protonix at 40 mg daily

## 2019-06-07 NOTE — Assessment & Plan Note (Signed)
Recurrent pancreatitis but note recent lipases were normal in the emergency room  The pancreas resection did not show malignancy but chronic pancreatitis in the past in 2015

## 2019-06-08 LAB — DRUG SCREEN 12+ALCOHOL+CRT, UR
Amphetamines, Urine: NEGATIVE ng/mL
BENZODIAZ UR QL: NEGATIVE ng/mL
Barbiturate: NEGATIVE ng/mL
Cannabinoids: NEGATIVE ng/mL
Cocaine (Metabolite): NEGATIVE ng/mL
Creatinine, Urine: 292.9 mg/dL (ref 20.0–300.0)
Ethanol, Urine: NEGATIVE %
Meperidine: NEGATIVE ng/mL
Methadone: NEGATIVE ng/mL
OPIATE SCREEN URINE: NEGATIVE ng/mL
Oxycodone/Oxymorphone, Urine: NEGATIVE ng/mL
Phencyclidine: NEGATIVE ng/mL
Propoxyphene: NEGATIVE ng/mL
Tramadol: NEGATIVE ng/mL

## 2019-06-08 LAB — COMPREHENSIVE METABOLIC PANEL
ALT: 28 IU/L (ref 0–44)
AST: 28 IU/L (ref 0–40)
Albumin/Globulin Ratio: 2 (ref 1.2–2.2)
Albumin: 4 g/dL (ref 3.8–4.9)
Alkaline Phosphatase: 128 IU/L — ABNORMAL HIGH (ref 39–117)
BUN/Creatinine Ratio: 6 — ABNORMAL LOW (ref 9–20)
BUN: 5 mg/dL — ABNORMAL LOW (ref 6–24)
Bilirubin Total: 0.3 mg/dL (ref 0.0–1.2)
CO2: 25 mmol/L (ref 20–29)
Calcium: 9.3 mg/dL (ref 8.7–10.2)
Chloride: 103 mmol/L (ref 96–106)
Creatinine, Ser: 0.78 mg/dL (ref 0.76–1.27)
GFR calc Af Amer: 119 mL/min/{1.73_m2} (ref >59)
GFR calc non Af Amer: 103 mL/min/{1.73_m2} (ref >59)
Globulin, Total: 2 g/dL (ref 1.5–4.5)
Glucose: 93 mg/dL (ref 65–99)
Potassium: 4.6 mmol/L (ref 3.5–5.2)
Sodium: 139 mmol/L (ref 134–144)
Total Protein: 6 g/dL (ref 6.0–8.5)

## 2019-06-20 NOTE — Addendum Note (Signed)
Addended by: Emilio Aspen A on: 06/20/2019 10:43 AM   Modules accepted: Orders

## 2019-08-09 ENCOUNTER — Other Ambulatory Visit: Payer: Self-pay | Admitting: Critical Care Medicine

## 2019-08-09 DIAGNOSIS — K8681 Exocrine pancreatic insufficiency: Secondary | ICD-10-CM

## 2019-08-09 MED FILL — AMLODIPINE BESYLATE 10 MG T: 10 | 30 days supply | Qty: 30 | Fill #1

## 2019-08-09 MED FILL — ?PROMETHAZINE HCL 25MG TABS: AMNEAL | 20 days supply | Qty: 60 | Fill #1

## 2019-08-18 ENCOUNTER — Other Ambulatory Visit: Payer: Self-pay

## 2019-08-18 ENCOUNTER — Encounter: Payer: Self-pay | Admitting: Internal Medicine

## 2019-08-18 ENCOUNTER — Ambulatory Visit: Payer: Self-pay | Attending: Internal Medicine | Admitting: Internal Medicine

## 2019-08-18 DIAGNOSIS — I10 Essential (primary) hypertension: Secondary | ICD-10-CM

## 2019-08-18 DIAGNOSIS — K861 Other chronic pancreatitis: Secondary | ICD-10-CM

## 2019-08-18 DIAGNOSIS — F17210 Nicotine dependence, cigarettes, uncomplicated: Secondary | ICD-10-CM

## 2019-08-18 DIAGNOSIS — K8681 Exocrine pancreatic insufficiency: Secondary | ICD-10-CM

## 2019-08-18 DIAGNOSIS — Z1211 Encounter for screening for malignant neoplasm of colon: Secondary | ICD-10-CM

## 2019-08-18 DIAGNOSIS — Z720 Tobacco use: Secondary | ICD-10-CM

## 2019-08-18 MED ORDER — TRAMADOL HCL 50 MG PO TABS: 50.0000 mg | ORAL_TABLET | Freq: Two times a day (BID) | ORAL | 0 refills | Status: DC | PRN

## 2019-08-18 NOTE — Progress Notes (Signed)
Virtual Visit via Telephone Note Due to current restrictions/limitations of in-office visits due to the COVID-19 pandemic, this scheduled clinical appointment was converted to a telehealth visit  I connected with Brent Rogers on 08/18/19 at 2:51 p.m by telephone and verified that I am speaking with the correct person using two identifiers. I am in my office.  The patient is at home.  Only the patient and myself participated in this encounter.  I discussed the limitations, risks, security and privacy concerns of performing an evaluation and management service by telephone and the availability of in person appointments. I also discussed with the patient that there may be a patient responsible charge related to this service. The patient expressed understanding and agreed to proceed.   History of Present Illness: Pt with history of chronic pancreatitiss/p benign pancreatic tumor excision with Whipple procedure,fhx of pancreatic cancer in father,hypertension, GERD, tob dep, depression.  I last saw him 09/2018.  Last seen in September of this year by Dr. Joya Rogers.  HTN: taking Norvasc but no device to check.  Limits salt in foods No CP/SOB/LE edema  Chronic Pancreatitis:  Few mild flares since 05/2019.  Able to control with Tramadol.  Did not want to go to ER due to [place] pandemic.  Requests refill on tramadol.  He is taking Creon as prescribed. -On last visit with Dr. Joya Rogers he reported that he was drinking a six-pack every 1 to 2 weeks.  He states that he has cut back on that.  He has drank a sixpack only once in the past 1 month.  rank a six pk in the past 1 mth  Tob:  He has cut back a lot.  1 pk now last 1 mth.  Trying to quit "but I get stress out" and smoke more when he is stress.  Taking Bupropion.  HM:  He mail off his FIT test a few wks after visit in Sept 2020.    Outpatient Encounter Medications as of 08/18/2019  Medication Sig  . acetaminophen (TYLENOL) 500 MG tablet Take 1,000 mg  by mouth every 6 (six) hours as needed for mild pain.  Marland Kitchen amLODipine (NORVASC) 10 MG tablet Take 1 tablet (10 mg total) by mouth daily.  Marland Kitchen buPROPion (WELLBUTRIN SR) 100 MG 12 hr tablet Take 1 tablet (100 mg total) by mouth 2 (two) times daily.  . Multiple Vitamin (MULTIVITAMIN WITH MINERALS) TABS tablet Take 1 tablet by mouth daily. (Patient not taking: Reported on 05/17/2019)  . Pancrelipase, Lip-Prot-Amyl, (CREON) 24000-76000 units CPEP 1 tab PO TID with meals  . pantoprazole (PROTONIX) 40 MG tablet Take 1 tablet (40 mg total) by mouth daily.  . promethazine (PHENERGAN) 25 MG tablet TAKE 1 TABLET BY MOUTH EVERY 8 HOURS AS NEEDED FOR NAUSEA OR VOMITING.  . traMADol (ULTRAM) 50 MG tablet Take 1 tablet (50 mg total) by mouth every 12 (twelve) hours as needed.   No facility-administered encounter medications on file as of 08/18/2019.     Observations/Objective:   Chemistry      Component Value Date/Time   NA 139 06/07/2019 1449   K 4.6 06/07/2019 1449   CL 103 06/07/2019 1449   CO2 25 06/07/2019 1449   BUN 5 (L) 06/07/2019 1449   CREATININE 0.78 06/07/2019 1449   CREATININE 0.91 10/15/2015 1517      Component Value Date/Time   CALCIUM 9.3 06/07/2019 1449   ALKPHOS 128 (H) 06/07/2019 1449   AST 28 06/07/2019 1449   ALT 28 06/07/2019 1449  BILITOT 0.3 06/07/2019 1449     Lab Results  Component Value Date   WBC 6.5 05/17/2019   HGB 14.8 05/17/2019   HCT 45.9 05/17/2019   MCV 92.0 05/17/2019   PLT 263 05/17/2019     Assessment and Plan: 1. Essential hypertension Level of control unknown as he does not have a blood pressure monitoring device.  He will continue amlodipine and low-salt diet.  2. Chronic pancreatitis, unspecified pancreatitis type ([place]) He is doing okay on Creon and uses tramadol only when needed.  He is up-to-date with controlled substance prescribing agreement and urine drug screen which were done 05/2019. Encouraged him to abstain from all alcoholic  beverages - traMADol (ULTRAM) 50 MG tablet; Take 1 tablet (50 mg total) by mouth every 12 (twelve) hours as needed.  Dispense: 60 tablet; Refill: 0  3. Exocrine pancreatic insufficiency ([place]) See #2 above  4. Tobacco abuse Commended him on cutting back.  Encouraged him to quit.  He feels he is making some progress.  I have encouraged him to set a quit date.  Less than 5 minutes spent on counseling  5. Screening for colon cancer I will have the lab tech check with [facility] to see if they receive his fit test. Addendum: I did have the lab tech check on this after the patient hung up.  [facility] stated that they did not receive a sample in the mail.  I will have my CMA call him so that he can come in and get another kit.  Follow Up Instructions: 3-4 mths   I discussed the assessment and treatment plan with the patient. The patient was provided an opportunity to ask questions and all were answered. The patient agreed with the plan and demonstrated an understanding of the instructions.   The patient was advised to call back or seek an in-person evaluation if the symptoms worsen or if the condition fails to improve as anticipated.  I provided 10 minutes of non-face-to-face time during this encounter.   Karle Plumber, MD

## 2019-08-18 NOTE — Progress Notes (Signed)
Pt states he is having abdominal pain as well

## 2019-08-19 MED FILL — traMADol HCL 50 MG TABS: 50 | 30 days supply | Qty: 60 | Fill #0

## 2019-08-22 ENCOUNTER — Telehealth: Payer: Self-pay

## 2019-08-22 NOTE — Telephone Encounter (Signed)
Contacted pt to schedule 3-4 month f/u pt didn't answer lvm asking pt to give a call back to schedule

## 2019-10-28 ENCOUNTER — Ambulatory Visit: Payer: Self-pay | Attending: Internal Medicine | Admitting: Internal Medicine

## 2019-10-28 ENCOUNTER — Other Ambulatory Visit: Payer: Self-pay

## 2019-10-29 ENCOUNTER — Emergency Department (HOSPITAL_COMMUNITY): Payer: Medicaid Other

## 2019-10-29 ENCOUNTER — Other Ambulatory Visit: Payer: Self-pay

## 2019-10-29 ENCOUNTER — Encounter (HOSPITAL_COMMUNITY): Payer: Self-pay | Admitting: *Deleted

## 2019-10-29 ENCOUNTER — Emergency Department (HOSPITAL_COMMUNITY)
Admission: EM | Admit: 2019-10-29 | Discharge: 2019-10-30 | Disposition: A | Discharge status: Home / Self Care | Payer: Medicaid Other | Attending: Emergency Medicine | Admitting: Emergency Medicine

## 2019-10-29 DIAGNOSIS — R0789 Other chest pain: Secondary | ICD-10-CM | POA: Insufficient documentation

## 2019-10-29 DIAGNOSIS — I10 Essential (primary) hypertension: Secondary | ICD-10-CM | POA: Insufficient documentation

## 2019-10-29 DIAGNOSIS — R197 Diarrhea, unspecified: Secondary | ICD-10-CM | POA: Diagnosis not present

## 2019-10-29 DIAGNOSIS — R109 Unspecified abdominal pain: Secondary | ICD-10-CM

## 2019-10-29 DIAGNOSIS — R112 Nausea with vomiting, unspecified: Secondary | ICD-10-CM | POA: Insufficient documentation

## 2019-10-29 DIAGNOSIS — R1012 Left upper quadrant pain: Secondary | ICD-10-CM | POA: Insufficient documentation

## 2019-10-29 DIAGNOSIS — F17210 Nicotine dependence, cigarettes, uncomplicated: Secondary | ICD-10-CM | POA: Insufficient documentation

## 2019-10-29 DIAGNOSIS — Z79899 Other long term (current) drug therapy: Secondary | ICD-10-CM | POA: Diagnosis not present

## 2019-10-29 LAB — URINALYSIS, ROUTINE W REFLEX MICROSCOPIC
Bilirubin Urine: NEGATIVE
Glucose, UA: NEGATIVE mg/dL
Hgb urine dipstick: NEGATIVE
Ketones, ur: 20 mg/dL — AB
Leukocytes,Ua: NEGATIVE
Nitrite: NEGATIVE
Protein, ur: NEGATIVE mg/dL
Specific Gravity, Urine: 1.023 (ref 1.005–1.030)
pH: 5 (ref 5.0–8.0)

## 2019-10-29 LAB — CBC
HCT: 43.5 % (ref 39.0–52.0)
Hemoglobin: 14.2 g/dL (ref 13.0–17.0)
MCH: 28.4 pg (ref 26.0–34.0)
MCHC: 32.6 g/dL (ref 30.0–36.0)
MCV: 87 fL (ref 80.0–100.0)
Platelets: 326 10*3/uL (ref 150–400)
RBC: 5 MIL/uL (ref 4.22–5.81)
RDW: 15.9 % — ABNORMAL HIGH (ref 11.5–15.5)
WBC: 6.5 10*3/uL (ref 4.0–10.5)
nRBC: 0 % (ref 0.0–0.2)

## 2019-10-29 MED ORDER — SODIUM CHLORIDE 0.9 % IV BOLUS
1000.0000 mL | Freq: Once | INTRAVENOUS | Status: AC
  Administered 2019-10-30: 1000 mL via INTRAVENOUS

## 2019-10-29 MED ORDER — HYDROMORPHONE HCL 1 MG/ML IJ SOLN
1.0000 mg | Freq: Once | INTRAMUSCULAR | Status: AC
  Administered 2019-10-30: 1 mg via INTRAVENOUS
  Filled 2019-10-29: qty 1

## 2019-10-29 MED ORDER — SODIUM CHLORIDE 0.9% FLUSH
3.0000 mL | Freq: Once | INTRAVENOUS | Status: AC
  Administered 2019-10-30: 3 mL via INTRAVENOUS

## 2019-10-29 MED ORDER — ONDANSETRON HCL 4 MG/2ML IJ SOLN
4.0000 mg | Freq: Once | INTRAMUSCULAR | Status: AC
  Administered 2019-10-30: 4 mg via INTRAVENOUS
  Filled 2019-10-29: qty 2

## 2019-10-29 NOTE — ED Triage Notes (Signed)
The pt is c/o abd and chest pain for 3 days  With nausea and vomiting.  Hx pancreatitis

## 2019-10-29 NOTE — ED Provider Notes (Signed)
[facility] EMERGENCY DEPARTMENT Provider Note   CSN: [id number] Arrival date & time: 10/29/19  2239     History Chief Complaint  Patient presents with  . Abdominal Pain  . Chest Pain    Brent Rogers is a 54 y.o. male.  The history is provided by the patient and medical records.    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  States this got severe about 3 days ago, pain mostly in the left side of his abdomen which is typical for him during flares of pancreatitis.  Has had associated nausea/vomiting/diarrhea.  No fever, chills, cough, SOB, or sweats.  States he has felt pain in the chest, feels like "acid".  He is followed by GI.  Does report drinking a 16oz beer last evening to help him sleep otherwise has not had any alcohol.  Past Medical History:  Diagnosis Date  . Benign tumor of endocrine pancreas   . Chronic pancreatitis ([place])    S/P Whipple  . Foot ulcer with fat layer exposed ([place])   . Hypertension   . Migraine    "@ least once/month" (11/12/2015)  . Pancreatic abnormality    CT  shows mass  . Pneumonia 11/12/2015  . Scoliosis     Patient Active Problem List   Diagnosis Date Noted  . 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 Chronic pain syndrome 06/07/2019  . Depression 06/07/2019  . Chronic pancreatitis ([place]) 05/01/2017  . Foot callus 12/25/2015  . Onychomycosis of toenail 12/25/2015  . Fatty liver 11/12/2015  . GERD (gastroesophageal reflux disease) 11/12/2015  . HTN (hypertension) 07/10/2015  . S/P cholecystectomy 07/10/2015  . Tobacco abuse 12/26/2014  . Protein-calorie malnutrition, severe ([place]) 11/20/2014  . Recurrent pancreatitis 06/06/2014  . Exocrine pancreatic insufficiency ([place]) 12/23/2013    Past Surgical History:  Procedure Laterality Date  . BACK SURGERY    . EUS N/A  09/21/2013   Procedure: ESOPHAGEAL ENDOSCOPIC ULTRASOUND (EUS) RADIAL;  Surgeon: Beryle Beams, MD;  Location: [facility] ENDOS[facility];  Service: Endoscopy;  Laterality: N/A;  . EUS N/A 09/30/2013   Procedure: UPPER ENDOSCOPIC ULTRASOUND (EUS) LINEAR;  Surgeon: Beryle Beams, MD;  Location: [facility];  Service: Endoscopy;  Laterality: N/A;  . FINE NEEDLE ASPIRATION N/A 09/21/2013   Procedure: FINE NEEDLE ASPIRATION (FNA) LINEAR;  Surgeon: Beryle Beams, MD;  [facility];  Service: Endoscopy;  Laterality: N/A;  . FINGER FRACTURE SURGERY Left 1995   5th digit  . FRACTURE SURGERY    . KNEE ARTHROSCOPY Bilateral 1987-1989   right-left  . LAPAROSCOPY N/A 12/01/2013   Procedure: LAPAROSCOPY DIAGNOSTIC PANCREATICODUODENECTOMY WITH BILIARY AND PANCREATIC STENTS;  Surgeon: Stark Klein, MD;  Location: [facility] ORS;  Service: General;  Laterality: N/A;  . LUMBAR [place] SURGERY  1990's  . WHIPPLE PROCEDURE  12/01/2013       Family History  Problem Relation Age of Onset  . Cancer Mother        unsure  . COPD Mother   . Hypertension Mother   . Hypertension Father   . COPD Father   . CAD Sister        possible has stent per pt    Social History   Tobacco Use  . Smoking status: Current Every Day Smoker    Packs/day: 0.25    Years: 30.00    Pack years: 7.50  Types: Cigarettes  . Smokeless tobacco: Former Systems developer    Types: Snuff  . Tobacco comment: "used snuff 10-15 years in my 20s-30s"  Substance Use Topics  . Alcohol use: Yes    Alcohol/week: 4.0 standard drinks    Types: 4 Cans of beer per week    Comment: 05/01/16  . Drug use: No    Home Medications Prior to Admission medications   Medication Sig Start Date End Date Taking? Authorizing Provider  acetaminophen (TYLENOL) 500 MG tablet Take 1,000 mg by mouth every 6 (six) hours as needed for mild pain.    [provider]  amLODipine (NORVASC) 10 MG tablet Take 1 tablet (10 mg total) by mouth daily. 06/07/19   Elsie Stain, MD   buPROPion (WELLBUTRIN SR) 100 MG 12 hr tablet Take 1 tablet (100 mg total) by mouth 2 (two) times daily. 06/07/19   Elsie Stain, MD  Multiple Vitamin (MULTIVITAMIN WITH MINERALS) TABS tablet Take 1 tablet by mouth daily. Patient not taking: Reported on 05/17/2019 09/18/18   Kayleen Memos, DO  Pancrelipase, Lip-Prot-Amyl, (CREON) [phone] units CPEP 1 tab PO TID with meals 06/07/19   Elsie Stain, MD  pantoprazole (PROTONIX) 40 MG tablet Take 1 tablet (40 mg total) by mouth daily. 06/07/19   Elsie Stain, MD  promethazine (PHENERGAN) 25 MG tablet TAKE 1 TABLET BY MOUTH EVERY 8 HOURS AS NEEDED FOR NAUSEA OR VOMITING. 06/07/19   Elsie Stain, MD  traMADol (ULTRAM) 50 MG tablet Take 1 tablet (50 mg total) by mouth every 12 (twelve) hours as needed. 08/18/19   Ladell Pier, MD    Allergies    Patient has no known allergies.  Review of Systems   Review of Systems  Gastrointestinal: Positive for abdominal pain, nausea and vomiting.  All other systems reviewed and are negative.   Physical Exam Updated Vital Signs BP (!) 148/102 (BP Location: Left Arm)   Pulse (!) 101   Temp 98 F (36.7 C) (Oral)   Resp 18   Ht 6\' 3"  (1.905 m)   Wt 72.6 kg   SpO2 100%   BMI 20.00 kg/m   Physical Exam Vitals and nursing note reviewed.  Constitutional:      Appearance: He is well-developed.  HENT:     Head: Normocephalic and atraumatic.  Eyes:     Conjunctiva/sclera: Conjunctivae normal.     Pupils: Pupils are equal, round, and reactive to light.  Cardiovascular:     Rate and Rhythm: Regular rhythm. Tachycardia present.     Heart sounds: Normal heart sounds.     Comments: Tachycardic in low 100's Pulmonary:     Effort: Pulmonary effort is normal.     Breath sounds: Normal breath sounds.  Abdominal:     General: Bowel sounds are normal.     Palpations: Abdomen is soft.     Tenderness: There is no abdominal tenderness. There is no guarding or rebound.     Comments: Well  healed abdominal incisions, grossly non-tender, points to left abdomen as area of pain  Musculoskeletal:        General: Normal range of motion.     Cervical back: Normal range of motion.  Skin:    General: Skin is warm and dry.  Neurological:     Mental Status: He is alert and oriented to person, place, and time.     ED Results / Procedures / Treatments   Labs (all labs ordered are listed, but only  abnormal results are displayed) Labs Reviewed  BASIC METABOLIC PANEL - Abnormal; Notable for the following components:      Result Value   Glucose, Bld 131 (*)    BUN 5 (*)    All other components within normal limits  CBC - Abnormal; Notable for the following components:   RDW 15.9 (*)    All other components within normal limits  URINALYSIS, ROUTINE W REFLEX MICROSCOPIC - Abnormal; Notable for the following components:   Ketones, ur 20 (*)    All other components within normal limits  HEPATIC FUNCTION PANEL - Abnormal; Notable for the following components:   AST 52 (*)    All other components within normal limits  LIPASE, BLOOD  TROPONIN I (HIGH SENSITIVITY)  TROPONIN I (HIGH SENSITIVITY)    EKG None  Radiology DG Chest 2 View  Result Date: 10/29/2019 CLINICAL DATA:  Chest and abdominal pain. EXAM: CHEST - 2 VIEW COMPARISON:  September 15, 2018. FINDINGS: The heart size and mediastinal contours are within normal limits. Both lungs are clear. The visualized skeletal structures are unremarkable. IMPRESSION: No active cardiopulmonary disease. Electronically Signed   By: Constance Holster M.D.   On: 10/29/2019 23:10    Procedures Procedures (including critical care time)  Medications Ordered in ED Medications  sodium chloride flush (NS) 0.9 % injection 3 mL (3 mLs Intravenous Given 10/30/19 0033)  HYDROmorphone (DILAUDID) injection 1 mg (1 mg Intravenous Given 10/30/19 0025)  ondansetron (ZOFRAN) injection 4 mg (4 mg Intravenous Given 10/30/19 0027)  sodium chloride 0.9 %  bolus 1,000 mL (0 mLs Intravenous Stopped 10/30/19 0155)  HYDROmorphone (DILAUDID) injection 1 mg (1 mg Intravenous Given 10/30/19 0247)  HYDROmorphone (DILAUDID) injection 1 mg (1 mg Intravenous Given 10/30/19 0426)  ketorolac (TORADOL) 30 MG/ML injection 30 mg (30 mg Intravenous Given 10/30/19 0426)  ondansetron (ZOFRAN) injection 4 mg (4 mg Intravenous Given 10/30/19 0539)    ED Course  I have reviewed the triage vital signs and the nursing notes.  Pertinent labs & imaging results that were available during my care of the patient were reviewed by me and considered in my medical decision making (see chart for details).    MDM Rules/Calculators/A&P  54 year old male here with left-sided abdominal pain.  Has history of chronic pancreatitis status post Whipple.  Does report some nausea and vomiting.  He is afebrile and nontoxic in appearance here.  He is mildly tachycardic which may be from mild dehydration.  Abdomen is grossly soft and nontender.  No peritoneal signs.  We will obtain screening labs.  He was given IV fluids and pain medication.  Will reassess.  Labs are grossly reassuring, normal lipase.  No significant electrolyte imbalance.  Some improvement with initial pain meds, waiting for pain control.  His abdomen remains overall soft and benign and I do not feel he needs emergent imaging with CT at this time.  5:35 AM Patient resting comfortably.  VSS. Once awoken, states pain is mildly better.  He has tolerated full bottle of spirte here without issue.  Given reassuring labs and now tolerating PO, feel he is stable for discharge home with close follow-up with GI.  He will call tomorrow morning to get appt arranged.  It does appear that patient has not had any narcotic prescriptions filled in the past 3 months, given short supply of tramadol which she generally takes when needed.  Strongly advised to avoid alcohol.  He will return here for any new/acute changes.  Final Clinical Impression(s)  /  ED Diagnoses Final diagnoses:  Left sided abdominal pain    Rx / DC Orders ED Discharge Orders         Ordered    traMADol (ULTRAM) 50 MG tablet  Every 6 hours PRN     10/30/19 0545    ondansetron (ZOFRAN ODT) 4 MG disintegrating tablet  Every 8 hours PRN     10/30/19 0545           Larene Pickett, PA-C 10/30/19 0550    Palumbo, April, MD 10/30/19 [phone]

## 2019-10-30 LAB — HEPATIC FUNCTION PANEL
ALT: 23 U/L (ref 0–44)
AST: 52 U/L — ABNORMAL HIGH (ref 15–41)
Albumin: 4.1 g/dL (ref 3.5–5.0)
Alkaline Phosphatase: 115 U/L (ref 38–126)
Bilirubin, Direct: 0.1 mg/dL (ref 0.0–0.2)
Indirect Bilirubin: 0.4 mg/dL (ref 0.3–0.9)
Total Bilirubin: 0.5 mg/dL (ref 0.3–1.2)
Total Protein: 7.1 g/dL (ref 6.5–8.1)

## 2019-10-30 LAB — BASIC METABOLIC PANEL
Anion gap: 13 (ref 5–15)
BUN: 5 mg/dL — ABNORMAL LOW (ref 6–20)
CO2: 23 mmol/L (ref 22–32)
Calcium: 8.9 mg/dL (ref 8.9–10.3)
Chloride: 99 mmol/L (ref 98–111)
Creatinine, Ser: 0.84 mg/dL (ref 0.61–1.24)
GFR calc Af Amer: >60 mL/min (ref >60)
GFR calc non Af Amer: >60 mL/min (ref >60)
Glucose, Bld: 131 mg/dL — ABNORMAL HIGH (ref 70–99)
Potassium: 4.3 mmol/L (ref 3.5–5.1)
Sodium: 135 mmol/L (ref 135–145)

## 2019-10-30 LAB — TROPONIN I (HIGH SENSITIVITY)
Troponin I (High Sensitivity): <2 ng/L (ref <18)
Troponin I (High Sensitivity): 3 ng/L (ref <18)

## 2019-10-30 LAB — LIPASE, BLOOD: Lipase: 13 U/L (ref 11–51)

## 2019-10-30 MED ORDER — KETOROLAC TROMETHAMINE 30 MG/ML IJ SOLN
30.0000 mg | Freq: Once | INTRAMUSCULAR | Status: AC
  Administered 2019-10-30: 30 mg via INTRAVENOUS
  Filled 2019-10-30: qty 1

## 2019-10-30 MED ORDER — HYDROMORPHONE HCL 1 MG/ML IJ SOLN
1.0000 mg | Freq: Once | INTRAMUSCULAR | Status: AC
  Administered 2019-10-30: 1 mg via INTRAVENOUS
  Filled 2019-10-30: qty 1

## 2019-10-30 MED ORDER — ONDANSETRON 4 MG PO TBDP: 4.0000 mg | ORAL_TABLET | Freq: Three times a day (TID) | ORAL | 0 refills | Status: DC | PRN

## 2019-10-30 MED ORDER — ONDANSETRON HCL 4 MG/2ML IJ SOLN
4.0000 mg | Freq: Once | INTRAMUSCULAR | Status: AC
  Administered 2019-10-30: 4 mg via INTRAVENOUS
  Filled 2019-10-30: qty 2

## 2019-10-30 MED ORDER — TRAMADOL HCL 50 MG PO TABS: 50.0000 mg | ORAL_TABLET | Freq: Four times a day (QID) | ORAL | 0 refills | Status: DC | PRN

## 2019-10-30 NOTE — ED Notes (Signed)
Discharge instructions reviewed with pt. Pt verbalized understanding.   

## 2019-10-30 NOTE — Discharge Instructions (Addendum)
Take the prescribed medication as directed.  Avoid alcohol as this is going to worsen your pain. Follow-up with your GI doctor-- I would call first thing Monday morning for follow-up. Return to the ED for new or worsening symptoms.

## 2019-11-02 ENCOUNTER — Encounter (HOSPITAL_COMMUNITY): Payer: Self-pay | Admitting: Emergency Medicine

## 2019-11-02 ENCOUNTER — Emergency Department (HOSPITAL_COMMUNITY)
Admission: EM | Admit: 2019-11-02 | Discharge: 2019-11-02 | Disposition: A | Discharge status: Home / Self Care | Payer: Medicaid Other | Attending: Emergency Medicine | Admitting: Emergency Medicine

## 2019-11-02 ENCOUNTER — Other Ambulatory Visit: Payer: Self-pay

## 2019-11-02 ENCOUNTER — Emergency Department (HOSPITAL_COMMUNITY): Payer: Medicaid Other

## 2019-11-02 DIAGNOSIS — Z90410 Acquired total absence of pancreas: Secondary | ICD-10-CM | POA: Diagnosis not present

## 2019-11-02 DIAGNOSIS — F17210 Nicotine dependence, cigarettes, uncomplicated: Secondary | ICD-10-CM | POA: Insufficient documentation

## 2019-11-02 DIAGNOSIS — Z79899 Other long term (current) drug therapy: Secondary | ICD-10-CM | POA: Diagnosis not present

## 2019-11-02 DIAGNOSIS — R1013 Epigastric pain: Secondary | ICD-10-CM | POA: Insufficient documentation

## 2019-11-02 DIAGNOSIS — R109 Unspecified abdominal pain: Secondary | ICD-10-CM | POA: Diagnosis present

## 2019-11-02 DIAGNOSIS — I10 Essential (primary) hypertension: Secondary | ICD-10-CM | POA: Diagnosis not present

## 2019-11-02 LAB — URINALYSIS, ROUTINE W REFLEX MICROSCOPIC
Bilirubin Urine: NEGATIVE
Glucose, UA: NEGATIVE mg/dL
Hgb urine dipstick: NEGATIVE
Ketones, ur: 5 mg/dL — AB
Leukocytes,Ua: NEGATIVE
Nitrite: NEGATIVE
Protein, ur: NEGATIVE mg/dL
Specific Gravity, Urine: >1.046 — ABNORMAL HIGH (ref 1.005–1.030)
pH: 5 (ref 5.0–8.0)

## 2019-11-02 LAB — CBC
HCT: 48.3 % (ref 39.0–52.0)
Hemoglobin: 15.8 g/dL (ref 13.0–17.0)
MCH: 28.5 pg (ref 26.0–34.0)
MCHC: 32.7 g/dL (ref 30.0–36.0)
MCV: 87 fL (ref 80.0–100.0)
Platelets: 271 10*3/uL (ref 150–400)
RBC: 5.55 MIL/uL (ref 4.22–5.81)
RDW: 15.9 % — ABNORMAL HIGH (ref 11.5–15.5)
WBC: 6.7 10*3/uL (ref 4.0–10.5)
nRBC: 0 % (ref 0.0–0.2)

## 2019-11-02 LAB — COMPREHENSIVE METABOLIC PANEL
ALT: 20 U/L (ref 0–44)
AST: 41 U/L (ref 15–41)
Albumin: 4.9 g/dL (ref 3.5–5.0)
Alkaline Phosphatase: 144 U/L — ABNORMAL HIGH (ref 38–126)
Anion gap: 14 (ref 5–15)
BUN: 6 mg/dL (ref 6–20)
CO2: 24 mmol/L (ref 22–32)
Calcium: 9.1 mg/dL (ref 8.9–10.3)
Chloride: 101 mmol/L (ref 98–111)
Creatinine, Ser: 0.84 mg/dL (ref 0.61–1.24)
GFR calc Af Amer: >60 mL/min (ref >60)
GFR calc non Af Amer: >60 mL/min (ref >60)
Glucose, Bld: 110 mg/dL — ABNORMAL HIGH (ref 70–99)
Potassium: 4.4 mmol/L (ref 3.5–5.1)
Sodium: 139 mmol/L (ref 135–145)
Total Bilirubin: 0.8 mg/dL (ref 0.3–1.2)
Total Protein: 8.4 g/dL — ABNORMAL HIGH (ref 6.5–8.1)

## 2019-11-02 LAB — LIPASE, BLOOD: Lipase: 14 U/L (ref 11–51)

## 2019-11-02 MED ORDER — SODIUM CHLORIDE (PF) 0.9 % IJ SOLN
INTRAMUSCULAR | Status: AC
  Filled 2019-11-02: qty 50

## 2019-11-02 MED ORDER — SUCRALFATE 1 G PO TABS: 1.0000 g | ORAL_TABLET | Freq: Three times a day (TID) | ORAL | 0 refills | Status: DC

## 2019-11-02 MED ORDER — SODIUM CHLORIDE 0.9 % IV BOLUS
1000.0000 mL | Freq: Once | INTRAVENOUS | Status: AC
  Administered 2019-11-02: 1000 mL via INTRAVENOUS

## 2019-11-02 MED ORDER — ONDANSETRON HCL 4 MG/2ML IJ SOLN
4.0000 mg | Freq: Once | INTRAMUSCULAR | Status: AC
  Administered 2019-11-02: 4 mg via INTRAVENOUS
  Filled 2019-11-02: qty 2

## 2019-11-02 MED ORDER — LIDOCAINE VISCOUS HCL 2 % MT SOLN
15.0000 mL | Freq: Once | OROMUCOSAL | Status: AC
  Administered 2019-11-02: 15 mL via ORAL
  Filled 2019-11-02: qty 15

## 2019-11-02 MED ORDER — ALUM & MAG HYDROXIDE-SIMETH 200-200-20 MG/5ML PO SUSP
30.0000 mL | Freq: Once | ORAL | Status: AC
  Administered 2019-11-02: 30 mL via ORAL
  Filled 2019-11-02: qty 30

## 2019-11-02 MED ORDER — ONDANSETRON 4 MG PO TBDP
4.0000 mg | ORAL_TABLET | Freq: Once | ORAL | Status: AC
  Administered 2019-11-02: 4 mg via ORAL
  Filled 2019-11-02: qty 1

## 2019-11-02 MED ORDER — IOHEXOL 300 MG/ML  SOLN
100.0000 mL | Freq: Once | INTRAMUSCULAR | Status: AC | PRN
  Administered 2019-11-02: 100 mL via INTRAVENOUS

## 2019-11-02 MED ORDER — HYDROMORPHONE HCL 1 MG/ML IJ SOLN
1.0000 mg | Freq: Once | INTRAMUSCULAR | Status: AC
  Administered 2019-11-02: 1 mg via INTRAVENOUS
  Filled 2019-11-02: qty 1

## 2019-11-02 MED ORDER — ONDANSETRON 4 MG PO TBDP: 4.0000 mg | ORAL_TABLET | Freq: Three times a day (TID) | ORAL | 0 refills | Status: DC | PRN

## 2019-11-02 MED ORDER — SODIUM CHLORIDE 0.9% FLUSH
3.0000 mL | Freq: Once | INTRAVENOUS | Status: AC
  Administered 2019-11-02: 3 mL via INTRAVENOUS

## 2019-11-02 MED ORDER — FAMOTIDINE IN NACL 20-0.9 MG/50ML-% IV SOLN
20.0000 mg | INTRAVENOUS | Status: AC
  Administered 2019-11-02: 20 mg via INTRAVENOUS
  Filled 2019-11-02: qty 50

## 2019-11-02 NOTE — ED Notes (Signed)
PT tolerating ORAL Fluids

## 2019-11-02 NOTE — ED Provider Notes (Signed)
[facility] DEPT Provider Note   CSN: [id number] Arrival date & time: 11/02/19  0003     History Chief Complaint  Patient presents with  . Abdominal Pain    Brent Rogers is a 54 y.o. male.  Patient presents to the emergency department with chief complaint of epigastric pain.  He reports history of pancreatitis.  Status post Whipple.  States that he has been having some persistent epigastric and left upper quadrant abdominal pain.  Reports associated nausea and vomiting.  Denies any fevers or chills.  States that he did have some alcohol yesterday.  States that the pain radiates to his back.  States that this feels like his typical pancreatitis.  The history is provided by the patient. No language interpreter was used.       Past Medical History:  Diagnosis Date  . Benign tumor of endocrine pancreas   . Chronic pancreatitis ([employer])    S/P Whipple  . Foot ulcer with fat layer exposed ([place])   . Hypertension   . Migraine    "@ least once/month" (11/12/2015)  . Pancreatic abnormality    CT  shows mass  . Pneumonia 11/12/2015  . Scoliosis     Patient Active Problem List   Diagnosis Date Noted  . 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 Chronic pain syndrome 06/07/2019  . Depression 06/07/2019  . Chronic pancreatitis ([place]) 05/01/2017  . Foot callus 12/25/2015  . Onychomycosis of toenail 12/25/2015  . Fatty liver 11/12/2015  . GERD (gastroesophageal reflux disease) 11/12/2015  . HTN (hypertension) 07/10/2015  . S/P cholecystectomy 07/10/2015  . Tobacco abuse 12/26/2014  . Protein-calorie malnutrition, severe ([place]) 11/20/2014  . Recurrent pancreatitis 06/06/2014  . Exocrine pancreatic insufficiency ([place]) 12/23/2013    Past Surgical History:  Procedure Laterality Date  . BACK SURGERY    . EUS N/A 09/21/2013   Procedure: ESOPHAGEAL ENDOSCOPIC ULTRASOUND (EUS) RADIAL;  Surgeon: Beryle Beams, MD;  [facility];  Service:  Endoscopy;  Laterality: N/A;  . EUS N/A 09/30/2013   Procedure: UPPER ENDOSCOPIC ULTRASOUND (EUS) LINEAR;  Surgeon: Beryle Beams, MD;  [facility];  Service: Endoscopy;  Laterality: N/A;  . FINE NEEDLE ASPIRATION N/A 09/21/2013   Procedure: FINE NEEDLE ASPIRATION (FNA) LINEAR;  Surgeon: Beryle Beams, MD;  [facility];  Service: Endoscopy;  Laterality: N/A;  . FINGER FRACTURE SURGERY Left 1995   5th digit  . FRACTURE SURGERY    . KNEE ARTHROSCOPY Bilateral 1987-1989   right-left  . LAPAROSCOPY N/A 12/01/2013   Procedure: LAPAROSCOPY DIAGNOSTIC PANCREATICODUODENECTOMY WITH BILIARY AND PANCREATIC STENTS;  Surgeon: Stark Klein, MD;  [facility];  Service: General;  Laterality: N/A;  . LUMBAR [place] SURGERY  1990's  . WHIPPLE PROCEDURE  12/01/2013       Family History  Problem Relation Age of Onset  . Cancer Mother        unsure  . COPD Mother   . Hypertension Mother   . Hypertension Father   . COPD Father   . CAD Sister        possible has stent per pt    Social History   Tobacco Use  . Smoking status: Current Every Day Smoker    Packs/day: 0.25    Years: 30.00    Pack years: 7.50    Types: Cigarettes  . Smokeless tobacco: Former Systems developer    Types: Snuff  . Tobacco comment: "used snuff 10-15 years in my 20s-30s"  Substance  Use Topics  . Alcohol use: Yes    Alcohol/week: 4.0 standard drinks    Types: 4 Cans of beer per week    Comment: 05/01/16  . Drug use: No    Home Medications Prior to Admission medications   Medication Sig Start Date End Date Taking? Authorizing Provider  acetaminophen (TYLENOL) 500 MG tablet Take 1,000 mg by mouth every 6 (six) hours as needed for mild pain.   Yes [provider]  amLODipine (NORVASC) 10 MG tablet Take 1 tablet (10 mg total) by mouth daily. 06/07/19  Yes Elsie Stain, MD  buPROPion (WELLBUTRIN SR) 100 MG 12 hr tablet Take 1 tablet (100 mg total) by mouth 2 (two) times daily. 06/07/19  Yes Elsie Stain, MD  ondansetron (ZOFRAN ODT) 4 MG disintegrating tablet Take 1 tablet (4 mg total) by mouth every 8 (eight) hours as needed for nausea. 10/30/19  Yes Larene Pickett, PA-C  Pancrelipase, Lip-Prot-Amyl, (CREON) 24000-76000 units CPEP 1 tab PO TID with meals Patient taking differently: Take 1 capsule by mouth 3 (three) times daily. 1 tab PO TID with meals 06/07/19  Yes Elsie Stain, MD  pantoprazole (PROTONIX) 40 MG tablet Take 1 tablet (40 mg total) by mouth daily. 06/07/19  Yes Elsie Stain, MD  promethazine (PHENERGAN) 25 MG tablet TAKE 1 TABLET BY MOUTH EVERY 8 HOURS AS NEEDED FOR NAUSEA OR VOMITING. 06/07/19  Yes Elsie Stain, MD  traMADol (ULTRAM) 50 MG tablet Take 1 tablet (50 mg total) by mouth every 6 (six) hours as needed. Patient taking differently: Take 50 mg by mouth every 6 (six) hours as needed for moderate pain.  10/30/19  Yes Larene Pickett, PA-C  Multiple Vitamin (MULTIVITAMIN WITH MINERALS) TABS tablet Take 1 tablet by mouth daily. Patient not taking: Reported on 05/17/2019 09/18/18   Kayleen Memos, DO  sucralfate (CARAFATE) 1 g tablet Take 1 tablet (1 g total) by mouth 4 (four) times daily -  with meals and at bedtime. 11/02/19   Montine Circle, PA-C    Allergies    Patient has no known allergies.  Review of Systems   Review of Systems  All other systems reviewed and are negative.   Physical Exam Updated Vital Signs BP 121/76 (BP Location: Left Arm)   Pulse 80   Temp 98.1 F (36.7 C) (Oral)   Resp 18   Ht 6\' 3"  (1.905 m)   Wt 72.6 kg   SpO2 95%   BMI 20.00 kg/m   Physical Exam Vitals and nursing note reviewed.  Constitutional:      Appearance: He is well-developed.  HENT:     Head: Normocephalic and atraumatic.  Eyes:     Conjunctiva/sclera: Conjunctivae normal.  Cardiovascular:     Rate and Rhythm: Normal rate and regular rhythm.     Heart sounds: No murmur.  Pulmonary:     Effort: Pulmonary effort is normal. No respiratory distress.      Breath sounds: Normal breath sounds.  Abdominal:     Palpations: Abdomen is soft.     Tenderness: There is no abdominal tenderness.     Comments: Generalized abdominal discomfort, but no focal tenderness  Musculoskeletal:     Cervical back: Neck supple.  Skin:    General: Skin is warm and dry.  Neurological:     Mental Status: He is alert.  Psychiatric:        Mood and Affect: Mood normal.  Behavior: Behavior normal.     ED Results / Procedures / Treatments   Labs (all labs ordered are listed, but only abnormal results are displayed) Labs Reviewed  COMPREHENSIVE METABOLIC PANEL - Abnormal; Notable for the following components:      Result Value   Glucose, Bld 110 (*)    Total Protein 8.4 (*)    Alkaline Phosphatase 144 (*)    All other components within normal limits  CBC - Abnormal; Notable for the following components:   RDW 15.9 (*)    All other components within normal limits  URINALYSIS, ROUTINE W REFLEX MICROSCOPIC - Abnormal; Notable for the following components:   Specific Gravity, Urine >1.046 (*)    Ketones, ur 5 (*)    All other components within normal limits  LIPASE, BLOOD    EKG None  Radiology CT ABDOMEN PELVIS W CONTRAST  Result Date: 11/02/2019 CLINICAL DATA:  Concern for pancreatitis. Abdominal pain. Status post prior Whipple procedure. EXAM: CT ABDOMEN AND PELVIS WITH CONTRAST TECHNIQUE: Multidetector CT imaging of the abdomen and pelvis was performed using the standard protocol following bolus administration of intravenous contrast. CONTRAST:  188mL OMNIPAQUE IOHEXOL 300 MG/ML  SOLN COMPARISON:  April 22, 2019. FINDINGS: Lower chest: The lung bases are clear. The heart size is normal. Hepatobiliary: The liver is normal. Status post cholecystectomy.There is no biliary ductal dilation. Pancreas: Calcifications are noted within the pancreas consistent with chronic pancreatitis. This is similar to prior study. There is no evidence for acute  pancreatitis. Spleen: No splenic laceration or hematoma. Adrenals/Urinary Tract: --Adrenal glands: No adrenal hemorrhage. --Right kidney/ureter: No hydronephrosis or perinephric hematoma. --Left kidney/ureter: No hydronephrosis or perinephric hematoma. --Urinary bladder: Unremarkable. Stomach/Bowel: --Stomach/Duodenum: The patient is status post prior Whipple procedure. --Small bowel: No dilatation or inflammation. --Colon: No focal abnormality. --Appendix: Not visualized. No right lower quadrant inflammation or free fluid. Vascular/Lymphatic: Atherosclerotic calcification is present within the non-aneurysmal abdominal aorta, without hemodynamically significant stenosis. --No retroperitoneal lymphadenopathy. --No mesenteric lymphadenopathy. --No pelvic or inguinal lymphadenopathy. Reproductive: Unremarkable Other: No ascites or free air. The abdominal wall is normal. Musculoskeletal. No acute displaced fractures. IMPRESSION: 1. The patient is status post Whipple procedure. 2. No evidence of acute pancreatitis.  No acute abnormality. Electronically Signed   By: Constance Holster M.D.   On: 11/02/2019 02:58    Procedures Procedures (including critical care time)  Medications Ordered in ED Medications  sodium chloride (PF) 0.9 % injection (has no administration in time range)  sodium chloride flush (NS) 0.9 % injection 3 mL (3 mLs Intravenous Given 11/02/19 0118)  sodium chloride 0.9 % bolus 1,000 mL (0 mLs Intravenous Stopped 11/02/19 0321)  HYDROmorphone (DILAUDID) injection 1 mg (1 mg Intravenous Given 11/02/19 0120)  ondansetron (ZOFRAN) injection 4 mg (4 mg Intravenous Given 11/02/19 0118)  HYDROmorphone (DILAUDID) injection 1 mg (1 mg Intravenous Given 11/02/19 0212)  iohexol (OMNIPAQUE) 300 MG/ML solution 100 mL (100 mLs Intravenous Contrast Given 11/02/19 0240)  sodium chloride 0.9 % bolus 1,000 mL (1,000 mLs Intravenous New Bag/Given 11/02/19 0328)  famotidine (PEPCID) IVPB 20 mg premix (0 mg Intravenous  Stopped 11/02/19 0457)  alum & mag hydroxide-simeth (MAALOX/MYLANTA) 200-200-20 MG/5ML suspension 30 mL (30 mLs Oral Given 11/02/19 0337)    And  lidocaine (XYLOCAINE) 2 % viscous mouth solution 15 mL (15 mLs Oral Given 11/02/19 [id number])    ED Course  I have reviewed the triage vital signs and the nursing notes.  Pertinent labs & imaging results that were available during my  care of the patient were reviewed by me and considered in my medical decision making (see chart for details).    MDM Rules/Calculators/A&P                      Patient with history of pancreatitis.  Presenting with abdominal pain, nausea, vomiting.  He was seen recently for the same.  Will give fluids, pain and nausea medicine.  Will reassess.  Patient continues to complain of pain, labs are fairly reassuring, and are not significantly different from baseline.  CT shows no evidence of acute pancreatitis.  Patient states that he still feels uncomfortable, but is improved from before.  Given that he is well-appearing, vital signs are stable, no acute abnormality found on work-up today, I feel the patient can be safely discharged with continued management at home.  I have advised patient to stay away from alcohol.  I will try prescribing the patient some Carafate to take in addition to his Protonix, this may be beneficial.   Final Clinical Impression(s) / ED Diagnoses Final diagnoses:  Epigastric pain    Rx / DC Orders ED Discharge Orders         Ordered    sucralfate (CARAFATE) 1 g tablet  3 times daily with meals & bedtime     11/02/19 0515           Montine Circle, PA-C 11/02/19 0518    Molpus, Jenny Reichmann, MD 11/02/19 [phone]

## 2019-11-02 NOTE — ED Notes (Signed)
Pt made aware of need for a urine sample.

## 2019-11-02 NOTE — ED Notes (Signed)
Pt in CT.

## 2019-11-02 NOTE — ED Triage Notes (Signed)
Pt states that he has chronic pancreatitis with hx of tumor removal from pancreas.  C/o abd pain that he states feels like pancreatitis flare.  Pt states he was at [facility] a few days ago but his pain has not subsided.  Also [place].

## 2019-11-05 ENCOUNTER — Encounter (HOSPITAL_COMMUNITY): Payer: Self-pay | Admitting: *Deleted

## 2019-11-05 ENCOUNTER — Other Ambulatory Visit: Payer: Self-pay

## 2019-11-05 ENCOUNTER — Emergency Department (HOSPITAL_COMMUNITY)
Admission: EM | Admit: 2019-11-05 | Discharge: 2019-11-06 | Disposition: A | Discharge status: Home / Self Care | Payer: Medicaid Other | Attending: Emergency Medicine | Admitting: Emergency Medicine

## 2019-11-05 DIAGNOSIS — Z79899 Other long term (current) drug therapy: Secondary | ICD-10-CM | POA: Insufficient documentation

## 2019-11-05 DIAGNOSIS — I10 Essential (primary) hypertension: Secondary | ICD-10-CM | POA: Diagnosis not present

## 2019-11-05 DIAGNOSIS — Y906 Blood alcohol level of 120-199 mg/100 ml: Secondary | ICD-10-CM | POA: Insufficient documentation

## 2019-11-05 DIAGNOSIS — K219 Gastro-esophageal reflux disease without esophagitis: Secondary | ICD-10-CM

## 2019-11-05 DIAGNOSIS — F17210 Nicotine dependence, cigarettes, uncomplicated: Secondary | ICD-10-CM | POA: Diagnosis not present

## 2019-11-05 DIAGNOSIS — K860 Alcohol-induced chronic pancreatitis: Secondary | ICD-10-CM | POA: Diagnosis not present

## 2019-11-05 DIAGNOSIS — D137 Benign neoplasm of endocrine pancreas: Secondary | ICD-10-CM | POA: Diagnosis not present

## 2019-11-05 DIAGNOSIS — F10220 Alcohol dependence with intoxication, uncomplicated: Secondary | ICD-10-CM

## 2019-11-05 DIAGNOSIS — R1012 Left upper quadrant pain: Secondary | ICD-10-CM

## 2019-11-05 DIAGNOSIS — Z90411 Acquired partial absence of pancreas: Secondary | ICD-10-CM | POA: Insufficient documentation

## 2019-11-05 LAB — COMPREHENSIVE METABOLIC PANEL
ALT: 16 U/L (ref 0–44)
AST: 25 U/L (ref 15–41)
Albumin: 4.1 g/dL (ref 3.5–5.0)
Alkaline Phosphatase: 127 U/L — ABNORMAL HIGH (ref 38–126)
Anion gap: 10 (ref 5–15)
BUN: 5 mg/dL — ABNORMAL LOW (ref 6–20)
CO2: 23 mmol/L (ref 22–32)
Calcium: 9 mg/dL (ref 8.9–10.3)
Chloride: 102 mmol/L (ref 98–111)
Creatinine, Ser: 0.86 mg/dL (ref 0.61–1.24)
GFR calc Af Amer: >60 mL/min (ref >60)
GFR calc non Af Amer: >60 mL/min (ref >60)
Glucose, Bld: 121 mg/dL — ABNORMAL HIGH (ref 70–99)
Potassium: 4.2 mmol/L (ref 3.5–5.1)
Sodium: 135 mmol/L (ref 135–145)
Total Bilirubin: 0.7 mg/dL (ref 0.3–1.2)
Total Protein: 7.1 g/dL (ref 6.5–8.1)

## 2019-11-05 LAB — URINALYSIS, ROUTINE W REFLEX MICROSCOPIC
Bilirubin Urine: NEGATIVE
Glucose, UA: NEGATIVE mg/dL
Hgb urine dipstick: NEGATIVE
Ketones, ur: NEGATIVE mg/dL
Leukocytes,Ua: NEGATIVE
Nitrite: NEGATIVE
Protein, ur: NEGATIVE mg/dL
Specific Gravity, Urine: 1.003 — ABNORMAL LOW (ref 1.005–1.030)
pH: 5 (ref 5.0–8.0)

## 2019-11-05 LAB — CBC
HCT: 43 % (ref 39.0–52.0)
Hemoglobin: 14.1 g/dL (ref 13.0–17.0)
MCH: 28.1 pg (ref 26.0–34.0)
MCHC: 32.8 g/dL (ref 30.0–36.0)
MCV: 85.8 fL (ref 80.0–100.0)
Platelets: 212 10*3/uL (ref 150–400)
RBC: 5.01 MIL/uL (ref 4.22–5.81)
RDW: 15.9 % — ABNORMAL HIGH (ref 11.5–15.5)
WBC: 8.2 10*3/uL (ref 4.0–10.5)
nRBC: 0 % (ref 0.0–0.2)

## 2019-11-05 LAB — LIPASE, BLOOD: Lipase: 14 U/L (ref 11–51)

## 2019-11-05 MED ORDER — SODIUM CHLORIDE 0.9 % IV BOLUS
500.0000 mL | Freq: Once | INTRAVENOUS | Status: AC
  Administered 2019-11-06: 500 mL via INTRAVENOUS

## 2019-11-05 MED ORDER — ONDANSETRON HCL 4 MG/2ML IJ SOLN
4.0000 mg | Freq: Once | INTRAMUSCULAR | Status: AC
  Administered 2019-11-06: 4 mg via INTRAVENOUS
  Filled 2019-11-05: qty 2

## 2019-11-05 MED ORDER — TRAMADOL HCL 50 MG PO TABS
50.0000 mg | ORAL_TABLET | Freq: Once | ORAL | Status: AC
  Administered 2019-11-06: 50 mg via ORAL
  Filled 2019-11-05: qty 1

## 2019-11-05 MED ORDER — SODIUM CHLORIDE 0.9% FLUSH
3.0000 mL | Freq: Once | INTRAVENOUS | Status: AC
  Administered 2019-11-06: 3 mL via INTRAVENOUS

## 2019-11-05 NOTE — ED Triage Notes (Signed)
Pt arrives with c/o abdominal pain and vomiting. Reports being seen here for the same this week, symptoms not improved.

## 2019-11-06 LAB — ETHANOL: Alcohol, Ethyl (B): 134 mg/dL — ABNORMAL HIGH (ref <10)

## 2019-11-06 MED ORDER — FAMOTIDINE IN NACL 20-0.9 MG/50ML-% IV SOLN
20.0000 mg | Freq: Once | INTRAVENOUS | Status: AC
  Administered 2019-11-06: 20 mg via INTRAVENOUS
  Filled 2019-11-06: qty 50

## 2019-11-06 MED ORDER — PANTOPRAZOLE SODIUM 40 MG PO TBEC: 40.0000 mg | DELAYED_RELEASE_TABLET | Freq: Every day | ORAL | 0 refills | Status: DC

## 2019-11-06 NOTE — Discharge Instructions (Addendum)
Recommend that you continue your regular medications as prescribed. Also recommend that you stop drinking alcohol.

## 2019-11-06 NOTE — ED Provider Notes (Signed)
Brent [facility] Provider Note   CSN: [id number] Arrival date & time: 11/05/19  2210     History Chief Complaint  Patient presents with  . Abdominal Pain    Brent Rogers is a 54 y.o. male.  Patient with a history of chronic pancreatitis, HTN, GERD returns to the ED with pain in the LUQ, nausea, vomiting without fever that started 6 days ago. He reports he is unable to hold down any of his regular medications. No fever. Symptoms are consistent with previous pancreatitis. He reports being unable to get an appointment with his PCP so he returns to the ED. No hematemesis, diarrhea, cough, congestion or urinary symptoms.   The history is provided by the patient. No language interpreter was used.  Abdominal Pain Associated symptoms: nausea and vomiting   Associated symptoms: no chills, no cough, no diarrhea, no dysuria, no fever and no shortness of breath        Past Medical History:  Diagnosis Date  . Benign tumor of endocrine pancreas   . Chronic pancreatitis ([place])    S/P Whipple  . Foot ulcer with fat layer exposed ([place])   . Hypertension   . Migraine    "@ least once/month" (11/12/2015)  . Pancreatic abnormality    CT  shows mass  . Pneumonia 11/12/2015  . Scoliosis     Patient Active Problem List   Diagnosis Date Noted  . 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 Chronic pain syndrome 06/07/2019  . Depression 06/07/2019  . Chronic pancreatitis ([place]) 05/01/2017  . Foot callus 12/25/2015  . Onychomycosis of toenail 12/25/2015  . Fatty liver 11/12/2015  . GERD (gastroesophageal reflux disease) 11/12/2015  . HTN (hypertension) 07/10/2015  . S/P cholecystectomy 07/10/2015  . Tobacco abuse 12/26/2014  . Protein-calorie malnutrition, severe ([place]) 11/20/2014  . Recurrent pancreatitis 06/06/2014  . Exocrine pancreatic insufficiency ([place]) 12/23/2013    Past Surgical History:  Procedure Laterality Date  . BACK SURGERY    . EUS  N/A 09/21/2013   Procedure: ESOPHAGEAL ENDOSCOPIC ULTRASOUND (EUS) RADIAL;  Surgeon: Beryle Beams, MD;  Location: [facility];  Service: Endoscopy;  Laterality: N/A;  . EUS N/A 09/30/2013   Procedure: UPPER ENDOSCOPIC ULTRASOUND (EUS) LINEAR;  Surgeon: Beryle Beams, MD;  Location: [facility];  Service: Endoscopy;  Laterality: N/A;  . FINE NEEDLE ASPIRATION N/A 09/21/2013   Procedure: FINE NEEDLE ASPIRATION (FNA) LINEAR;  Surgeon: Beryle Beams, MD;  Location: [facility];  Service: Endoscopy;  Laterality: N/A;  . FINGER FRACTURE SURGERY Left 1995   5th digit  . FRACTURE SURGERY    . KNEE ARTHROSCOPY Bilateral 1987-1989   right-left  . LAPAROSCOPY N/A 12/01/2013   Procedure: LAPAROSCOPY DIAGNOSTIC PANCREATICODUODENECTOMY WITH BILIARY AND PANCREATIC STENTS;  Surgeon: Stark Klein, MD;  Location: [facility];  Service: General;  Laterality: N/A;  . LUMBAR [place] SURGERY  1990's  . WHIPPLE PROCEDURE  12/01/2013       Family History  Problem Relation Age of Onset  . Cancer Mother        unsure  . COPD Mother   . Hypertension Mother   . Hypertension Father   . COPD Father   . CAD Sister        possible has stent per pt    Social History   Tobacco Use  . Smoking status: Current Every Day Smoker    Packs/day: 0.25    Years: 30.00    Pack years: 7.50  Types: Cigarettes  . Smokeless tobacco: Former Systems developer    Types: Snuff  . Tobacco comment: "used snuff 10-15 years in my 20s-30s"  Substance Use Topics  . Alcohol use: Yes    Alcohol/week: 4.0 standard drinks    Types: 4 Cans of beer per week    Comment: 05/01/16  . Drug use: No    Home Medications Prior to Admission medications   Medication Sig Start Date End Date Taking? Authorizing Provider  acetaminophen (TYLENOL) 500 MG tablet Take 1,000 mg by mouth every 6 (six) hours as needed for mild pain.    [provider]  amLODipine (NORVASC) 10 MG tablet Take 1 tablet (10 mg total) by mouth daily. 06/07/19   Elsie Stain, MD  buPROPion (WELLBUTRIN SR) 100 MG 12 hr tablet Take 1 tablet (100 mg total) by mouth 2 (two) times daily. 06/07/19   Elsie Stain, MD  Multiple Vitamin (MULTIVITAMIN WITH MINERALS) TABS tablet Take 1 tablet by mouth daily. Patient not taking: Reported on 05/17/2019 09/18/18   Kayleen Memos, DO  ondansetron (ZOFRAN ODT) 4 MG disintegrating tablet Take 1 tablet (4 mg total) by mouth every 8 (eight) hours as needed. 11/02/19   Montine Circle, PA-C  Pancrelipase, Lip-Prot-Amyl, (CREON) 24000-76000 units CPEP 1 tab PO TID with meals Patient taking differently: Take 1 capsule by mouth 3 (three) times daily. 1 tab PO TID with meals 06/07/19   Elsie Stain, MD  pantoprazole (PROTONIX) 40 MG tablet Take 1 tablet (40 mg total) by mouth daily. 06/07/19   Elsie Stain, MD  promethazine (PHENERGAN) 25 MG tablet TAKE 1 TABLET BY MOUTH EVERY 8 HOURS AS NEEDED FOR NAUSEA OR VOMITING. 06/07/19   Elsie Stain, MD  sucralfate (CARAFATE) 1 g tablet Take 1 tablet (1 g total) by mouth 4 (four) times daily -  with meals and at bedtime. 11/02/19   Montine Circle, PA-C  traMADol (ULTRAM) 50 MG tablet Take 1 tablet (50 mg total) by mouth every 6 (six) hours as needed. Patient taking differently: Take 50 mg by mouth every 6 (six) hours as needed for moderate pain.  10/30/19   Larene Pickett, PA-C    Allergies    Patient has no known allergies.  Review of Systems   Review of Systems  Constitutional: Negative for chills and fever.  HENT: Negative.   Respiratory: Negative.  Negative for cough and shortness of breath.   Cardiovascular: Negative.   Gastrointestinal: Positive for abdominal pain, nausea and vomiting. Negative for diarrhea.  Genitourinary: Negative for decreased urine volume and dysuria.  Musculoskeletal: Negative.   Skin: Negative.   Neurological: Negative.     Physical Exam Updated Vital Signs BP (!) 156/99 (BP Location: Right Arm)   Pulse 83   Temp 98.8 F (37.1 C) (Oral)    Resp (!) 21   SpO2 98%   Physical Exam Vitals and nursing note reviewed.  Constitutional:      Appearance: He is well-developed.  HENT:     Head: Normocephalic.  Cardiovascular:     Rate and Rhythm: Normal rate and regular rhythm.     Heart sounds: No murmur.  Pulmonary:     Effort: Pulmonary effort is normal.     Breath sounds: Normal breath sounds. No wheezing, rhonchi or rales.  Abdominal:     General: Bowel sounds are normal.     Palpations: Abdomen is soft.     Tenderness: There is abdominal tenderness in the left upper  quadrant. There is no guarding or rebound.  Musculoskeletal:        General: Normal range of motion.     Cervical back: Normal range of motion and neck supple.  Skin:    General: Skin is warm and dry.     Findings: No rash.  Neurological:     Mental Status: He is alert and oriented to person, place, and time.     ED Results / Procedures / Treatments   Labs (all labs ordered are listed, but only abnormal results are displayed) Labs Reviewed  COMPREHENSIVE METABOLIC PANEL - Abnormal; Notable for the following components:      Result Value   Glucose, Bld 121 (*)    BUN 5 (*)    Alkaline Phosphatase 127 (*)    All other components within normal limits  CBC - Abnormal; Notable for the following components:   RDW 15.9 (*)    All other components within normal limits  URINALYSIS, ROUTINE W REFLEX MICROSCOPIC - Abnormal; Notable for the following components:   Color, Urine STRAW (*)    Specific Gravity, Urine 1.003 (*)    All other components within normal limits  LIPASE, BLOOD  ETHANOL    EKG None  Radiology No results found.  Procedures Procedures (including critical care time)  Medications Ordered in ED Medications  sodium chloride flush (NS) 0.9 % injection 3 mL (has no administration in time range)  sodium chloride 0.9 % bolus 500 mL (has no administration in time range)  ondansetron (ZOFRAN) injection 4 mg (has no administration in  time range)  traMADol (ULTRAM) tablet 50 mg (has no administration in time range)    ED Course  I have reviewed the triage vital signs and the nursing notes.  Pertinent labs & imaging results that were available during my care of the patient were reviewed by me and considered in my medical decision making (see chart for details).    MDM Rules/Calculators/A&P                      Patient with chronic pancreatitis to ED for 3rd visit in 6 days for symptoms similar to previous pancreatitis. No fever.   He is well appearing. Does not appear in any distress. VSS. Will provide IV Zofran for nausea and give regular PO medications. Anticipate discharge home.   Patient requesting additional pain medications. IV pepcid ordered.   VSS, mild hypertension. No chest pain. ETOH 134, likely the cause for ongoing pain. Feel he is appropriate for discharge home with PCP follow up. Will Epic message his PCP to facilitate in office follow up.   Final Clinical Impression(s) / ED Diagnoses Final diagnoses:  None   1. Chronic pancreatitis 2. Alcohol dependence.  Rx / DC Orders ED Discharge Orders    None       Dennie Bible 11/06/19 [id number]    MesnerCorene Cornea, MD 11/06/19 [phone]

## 2019-11-06 NOTE — ED Notes (Signed)
Patient verbalizes understanding of discharge instructions. Opportunity for questioning and answers were provided. Armband removed by staff, pt discharged from ED in wheelchair to home.   

## 2019-11-07 MED FILL — PANTOPRAZOLE SOD DR 40 MG T: 40 | 30 days supply | Qty: 30 | Fill #0

## 2019-11-09 MED FILL — AMLODIPINE BESYLATE 10 MG T: 10 | 30 days supply | Qty: 30 | Fill #1

## 2019-11-09 MED FILL — ONDANSETRON ODT 4 MG TABLET: 4 | 3 days supply | Qty: 20 | Fill #0

## 2019-11-13 ENCOUNTER — Emergency Department (HOSPITAL_COMMUNITY)
Admission: EM | Admit: 2019-11-13 | Discharge: 2019-11-14 | Disposition: A | Discharge status: Home / Self Care | Payer: Medicaid Other | Attending: Emergency Medicine | Admitting: Emergency Medicine

## 2019-11-13 ENCOUNTER — Encounter (HOSPITAL_COMMUNITY): Payer: Self-pay | Admitting: Emergency Medicine

## 2019-11-13 DIAGNOSIS — I10 Essential (primary) hypertension: Secondary | ICD-10-CM | POA: Insufficient documentation

## 2019-11-13 DIAGNOSIS — R1012 Left upper quadrant pain: Secondary | ICD-10-CM | POA: Diagnosis present

## 2019-11-13 DIAGNOSIS — G8929 Other chronic pain: Secondary | ICD-10-CM

## 2019-11-13 DIAGNOSIS — K860 Alcohol-induced chronic pancreatitis: Secondary | ICD-10-CM

## 2019-11-13 DIAGNOSIS — M545 Low back pain, unspecified: Secondary | ICD-10-CM

## 2019-11-13 DIAGNOSIS — F17210 Nicotine dependence, cigarettes, uncomplicated: Secondary | ICD-10-CM | POA: Insufficient documentation

## 2019-11-13 DIAGNOSIS — Z79899 Other long term (current) drug therapy: Secondary | ICD-10-CM | POA: Insufficient documentation

## 2019-11-13 NOTE — ED Triage Notes (Signed)
Pt states he has left lower back pain that radiates around into is abdomen and chest.

## 2019-11-13 NOTE — ED Triage Notes (Signed)
Pt BIB [facility] from home for lower back pain 10/10 and states he thinks his pancreatitis is acting up again.

## 2019-11-14 LAB — CBC WITH DIFFERENTIAL/PLATELET
Abs Immature Granulocytes: 0.02 10*3/uL (ref 0.00–0.07)
Basophils Absolute: 0.1 10*3/uL (ref 0.0–0.1)
Basophils Relative: 2 %
Eosinophils Absolute: 0.2 10*3/uL (ref 0.0–0.5)
Eosinophils Relative: 3 %
HCT: 40.9 % (ref 39.0–52.0)
Hemoglobin: 13.1 g/dL (ref 13.0–17.0)
Immature Granulocytes: 0 %
Lymphocytes Relative: 38 %
Lymphs Abs: 1.9 10*3/uL (ref 0.7–4.0)
MCH: 28 pg (ref 26.0–34.0)
MCHC: 32 g/dL (ref 30.0–36.0)
MCV: 87.4 fL (ref 80.0–100.0)
Monocytes Absolute: 0.5 10*3/uL (ref 0.1–1.0)
Monocytes Relative: 9 %
Neutro Abs: 2.4 10*3/uL (ref 1.7–7.7)
Neutrophils Relative %: 48 %
Platelets: 225 10*3/uL (ref 150–400)
RBC: 4.68 MIL/uL (ref 4.22–5.81)
RDW: 16.6 % — ABNORMAL HIGH (ref 11.5–15.5)
WBC: 5 10*3/uL (ref 4.0–10.5)
nRBC: 0 % (ref 0.0–0.2)

## 2019-11-14 LAB — COMPREHENSIVE METABOLIC PANEL
ALT: 25 U/L (ref 0–44)
AST: 48 U/L — ABNORMAL HIGH (ref 15–41)
Albumin: 3.7 g/dL (ref 3.5–5.0)
Alkaline Phosphatase: 102 U/L (ref 38–126)
Anion gap: 10 (ref 5–15)
BUN: <5 mg/dL — ABNORMAL LOW (ref 6–20)
CO2: 23 mmol/L (ref 22–32)
Calcium: 8.5 mg/dL — ABNORMAL LOW (ref 8.9–10.3)
Chloride: 106 mmol/L (ref 98–111)
Creatinine, Ser: 0.67 mg/dL (ref 0.61–1.24)
GFR calc Af Amer: >60 mL/min (ref >60)
GFR calc non Af Amer: >60 mL/min (ref >60)
Glucose, Bld: 99 mg/dL (ref 70–99)
Potassium: 4.3 mmol/L (ref 3.5–5.1)
Sodium: 139 mmol/L (ref 135–145)
Total Bilirubin: 0.4 mg/dL (ref 0.3–1.2)
Total Protein: 6.7 g/dL (ref 6.5–8.1)

## 2019-11-14 LAB — LIPASE, BLOOD: Lipase: 16 U/L (ref 11–51)

## 2019-11-14 MED ORDER — MORPHINE SULFATE (PF) 4 MG/ML IV SOLN
4.0000 mg | Freq: Once | INTRAVENOUS | Status: AC
  Administered 2019-11-14: 4 mg via INTRAVENOUS
  Filled 2019-11-14: qty 1

## 2019-11-14 MED ORDER — KETOROLAC TROMETHAMINE 15 MG/ML IJ SOLN
15.0000 mg | Freq: Once | INTRAMUSCULAR | Status: AC
  Administered 2019-11-14: 15 mg via INTRAVENOUS
  Filled 2019-11-14: qty 1

## 2019-11-14 MED ORDER — TRAMADOL HCL 50 MG PO TABS: 50.0000 mg | ORAL_TABLET | Freq: Four times a day (QID) | ORAL | 0 refills | Status: DC | PRN

## 2019-11-14 NOTE — Discharge Instructions (Addendum)
Your labs are all normal today. There is no evidence of acute problem.   Please follow up with your doctor for recheck and further management.

## 2019-11-14 NOTE — ED Provider Notes (Signed)
[facility] DEPT Provider Note   CSN: [id number] Arrival date & time: 11/13/19  2302     History Chief Complaint  Patient presents with  . Back Pain    Brent Rogers is a 54 y.o. male.  Patient with history of chronic back pain, chronic pancreatitis s/p Whipple, HTN, presents with LUQ abdominal pain and left lower back pain, c/w history of chronic pain. No fever. He reports nausea and vomiting. No hematemesis, cough, CP. He reports he is out of his Ultram which usually controls his symptoms. No new concerns.  The history is provided by the patient. No language interpreter was used.       Past Medical History:  Diagnosis Date  . Benign tumor of endocrine pancreas   . Chronic pancreatitis ([place])    S/P Whipple  . Foot ulcer with fat layer exposed ([place])   . Hypertension   . Migraine    "@ least once/month" (11/12/2015)  . Pancreatic abnormality    CT  shows mass  . Pneumonia 11/12/2015  . Scoliosis     Patient Active Problem List   Diagnosis Date Noted  . 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 Chronic pain syndrome 06/07/2019  . Depression 06/07/2019  . Chronic pancreatitis ([place]) 05/01/2017  . Foot callus 12/25/2015  . Onychomycosis of toenail 12/25/2015  . Fatty liver 11/12/2015  . GERD (gastroesophageal reflux disease) 11/12/2015  . HTN (hypertension) 07/10/2015  . S/P cholecystectomy 07/10/2015  . Tobacco abuse 12/26/2014  . Protein-calorie malnutrition, severe ([place]) 11/20/2014  . Recurrent pancreatitis 06/06/2014  . Exocrine pancreatic insufficiency ([place]) 12/23/2013    Past Surgical History:  Procedure Laterality Date  . BACK SURGERY    . EUS N/A 09/21/2013   Procedure: ESOPHAGEAL ENDOSCOPIC ULTRASOUND (EUS) RADIAL;  Surgeon: Beryle Beams, MD;  [facility];  Service: Endoscopy;  Laterality: N/A;  . EUS N/A 09/30/2013   Procedure: UPPER ENDOSCOPIC ULTRASOUND (EUS) LINEAR;  Surgeon: Beryle Beams, MD;   [facility];  Service: Endoscopy;  Laterality: N/A;  . FINE NEEDLE ASPIRATION N/A 09/21/2013   Procedure: FINE NEEDLE ASPIRATION (FNA) LINEAR;  Surgeon: Beryle Beams, MD;  [facility];  Service: Endoscopy;  Laterality: N/A;  . FINGER FRACTURE SURGERY Left 1995   5th digit  . FRACTURE SURGERY    . KNEE ARTHROSCOPY Bilateral 1987-1989   right-left  . LAPAROSCOPY N/A 12/01/2013   Procedure: LAPAROSCOPY DIAGNOSTIC PANCREATICODUODENECTOMY WITH BILIARY AND PANCREATIC STENTS;  Surgeon: Stark Klein, MD;  Location: [facility];  Service: General;  Laterality: N/A;  . LUMBAR [place] SURGERY  1990's  . WHIPPLE PROCEDURE  12/01/2013       Family History  Problem Relation Age of Onset  . Cancer Mother        unsure  . COPD Mother   . Hypertension Mother   . Hypertension Father   . COPD Father   . CAD Sister        possible has stent per pt    Social History   Tobacco Use  . Smoking status: Current Every Day Smoker    Packs/day: 0.25    Years: 30.00    Pack years: 7.50    Types: Cigarettes  . Smokeless tobacco: Former Systems developer    Types: Snuff  . Tobacco comment: "used snuff 10-15 years in my 20s-30s"  Substance Use Topics  . Alcohol use: Yes    Alcohol/week: 4.0 standard drinks    Types: 4 Cans of beer per  week    Comment: 05/01/16  . Drug use: No    Home Medications Prior to Admission medications   Medication Sig Start Date End Date Taking? Authorizing Provider  acetaminophen (TYLENOL) 500 MG tablet Take 1,000 mg by mouth every 6 (six) hours as needed for mild pain.    [provider]  amLODipine (NORVASC) 10 MG tablet Take 1 tablet (10 mg total) by mouth daily. 06/07/19   Elsie Stain, MD  bismuth subsalicylate (PEPTO BISMOL) 262 MG/15ML suspension Take 30 mLs by mouth every 6 (six) hours as needed for indigestion or diarrhea or loose stools.    [provider]  buPROPion (WELLBUTRIN SR) 100 MG 12 hr tablet Take 1 tablet (100 mg total) by mouth 2  (two) times daily. 06/07/19   Elsie Stain, MD  guaiFENesin (MUCINEX) 600 MG 12 hr tablet Take 600 mg by mouth 2 (two) times daily as needed for cough.    [provider]  Multiple Vitamin (MULTIVITAMIN WITH MINERALS) TABS tablet Take 1 tablet by mouth daily. Patient not taking: Reported on 05/17/2019 09/18/18   Kayleen Memos, DO  ondansetron (ZOFRAN ODT) 4 MG disintegrating tablet Take 1 tablet (4 mg total) by mouth every 8 (eight) hours as needed. 11/02/19   Montine Circle, PA-C  Pancrelipase, Lip-Prot-Amyl, (CREON) 24000-76000 units CPEP 1 tab PO TID with meals Patient taking differently: Take 1 capsule by mouth 3 (three) times daily. 1 tab PO TID with meals 06/07/19   Elsie Stain, MD  pantoprazole (PROTONIX) 40 MG tablet Take 1 tablet (40 mg total) by mouth daily. 11/06/19   Charlann Lange, PA-C  promethazine (PHENERGAN) 25 MG tablet TAKE 1 TABLET BY MOUTH EVERY 8 HOURS AS NEEDED FOR NAUSEA OR VOMITING. Patient taking differently: Take 25 mg by mouth every 8 (eight) hours as needed for nausea.  06/07/19   Elsie Stain, MD  sucralfate (CARAFATE) 1 g tablet Take 1 tablet (1 g total) by mouth 4 (four) times daily -  with meals and at bedtime. 11/02/19   Montine Circle, PA-C  traMADol (ULTRAM) 50 MG tablet Take 1 tablet (50 mg total) by mouth every 6 (six) hours as needed. Patient taking differently: Take 50 mg by mouth every 6 (six) hours as needed for moderate pain.  10/30/19   Larene Pickett, PA-C    Allergies    Patient has no known allergies.  Review of Systems   Review of Systems  Constitutional: Negative for chills and fever.  HENT: Negative.   Respiratory: Negative.   Cardiovascular: Negative.   Gastrointestinal: Positive for abdominal pain, nausea and vomiting.  Genitourinary: Negative for enuresis.  Musculoskeletal: Positive for back pain.  Skin: Negative.   Neurological: Negative.  Negative for weakness and numbness.    Physical Exam Updated Vital Signs BP  (!) 119/95 (BP Location: Left Arm)   Pulse 66   Temp 98.6 F (37 C) (Oral)   Resp 16   Ht 6\' 3"  (1.905 m)   Wt 77.1 kg   SpO2 99%   BMI 21.25 kg/m   Physical Exam Vitals and nursing note reviewed.  Constitutional:      Appearance: He is well-developed.  HENT:     Head: Normocephalic.  Cardiovascular:     Rate and Rhythm: Normal rate and regular rhythm.  Pulmonary:     Effort: Pulmonary effort is normal.     Breath sounds: Normal breath sounds. No wheezing, rhonchi or rales.  Abdominal:  General: Bowel sounds are normal.     Palpations: Abdomen is soft.     Tenderness: There is abdominal tenderness (Left lower > upper abdominal tenderness, "normal for my pancreatitis"). There is no left CVA tenderness, guarding or rebound.  Musculoskeletal:        General: Normal range of motion.     Cervical back: Normal range of motion and neck supple.     Comments: Tender to palpation of left lower back. No flank tenderness. No midline tenderness.   Skin:    General: Skin is warm and dry.     Findings: No rash.  Neurological:     Mental Status: He is alert and oriented to person, place, and time.     ED Results / Procedures / Treatments   Labs (all labs ordered are listed, but only abnormal results are displayed) Labs Reviewed - No data to display  EKG None  Radiology No results found.  Procedures Procedures (including critical care time)  Medications Ordered in ED Medications  morphine 4 MG/ML injection 4 mg (has no administration in time range)  ketorolac (TORADOL) 15 MG/ML injection 15 mg (has no administration in time range)    ED Course  I have reviewed the triage vital signs and the nursing notes.  Pertinent labs & imaging results that were available during my care of the patient were reviewed by me and considered in my medical decision making (see chart for details).    MDM Rules/Calculators/A&P                      Patient given IV pain medication,  gentle fluids. Labs reviewed and are without concerning acute finding. Imaging is no felt indicated with chronic symptoms.   Discussed importance of PCP follow up. Patient reports feeling better after medications. He is stable for discharge home.   Final Clinical Impression(s) / ED Diagnoses Final diagnoses:  None   1. Chronic abdominal pain 2. Chronic back pain  Rx / DC Orders ED Discharge Orders    None       Charlann Lange, PA-C [id number]    Delora Fuel, MD [id number] [phone]

## 2019-11-17 ENCOUNTER — Ambulatory Visit: Payer: Self-pay | Attending: Internal Medicine | Admitting: Physician Assistant

## 2019-11-17 ENCOUNTER — Other Ambulatory Visit: Payer: Self-pay

## 2019-11-17 DIAGNOSIS — F1010 Alcohol abuse, uncomplicated: Secondary | ICD-10-CM

## 2019-11-17 DIAGNOSIS — Z09 Encounter for follow-up examination after completed treatment for conditions other than malignant neoplasm: Secondary | ICD-10-CM

## 2019-11-17 DIAGNOSIS — F32A Depression, unspecified: Secondary | ICD-10-CM

## 2019-11-17 DIAGNOSIS — F329 Major depressive disorder, single episode, unspecified: Secondary | ICD-10-CM

## 2019-11-17 DIAGNOSIS — K861 Other chronic pancreatitis: Secondary | ICD-10-CM

## 2019-11-17 MED ORDER — BUPROPION HCL ER (SR) 100 MG PO TB12: 100.0000 mg | ORAL_TABLET | Freq: Two times a day (BID) | ORAL | 4 refills | Status: DC

## 2019-11-17 MED ORDER — TRAMADOL HCL 50 MG PO TABS: 50.0000 mg | ORAL_TABLET | Freq: Two times a day (BID) | ORAL | 0 refills | Status: DC | PRN

## 2019-11-17 MED FILL — BUPROPION HCL SR 100 MG TAB: 100 | 30 days supply | Qty: 60 | Fill #0

## 2019-11-17 MED FILL — traMADol HCL 50 MG TABS: 50 | 30 days supply | Qty: 30 | Fill #0

## 2019-11-17 NOTE — Progress Notes (Signed)
Hospital f /u   Need med refills

## 2019-11-17 NOTE — Progress Notes (Signed)
Patient ID: Brent Rogers, male   DOB: Aug 24, 1966, 54 y.o.   MRN: [id number] Virtual Visit via Telephone Note  I connected with Brent Rogers on 11/17/19 at  2:10 PM EST by telephone and verified that I am speaking with the correct person using two identifiers.   I discussed the limitations, risks, security and privacy concerns of performing an evaluation and management service by telephone and the availability of in person appointments. I also discussed with the patient that there may be a patient responsible charge related to this service. The patient expressed understanding and agreed to proceed.  PATIENT visit by telephone virtually in the context of Covid-19 pandemic. Patient location:  home My Location:  [place] office Persons on the call:  Me and the patient   History of Present Illness: Multiple recent ED visits for L sided abdominal pain .  H/o chronic pancreatitis and s/p whipple procedure. Still having some moderate pain.  No fever.  No vomiting.  Work-ups have not shown any dangerous process.  Last ED visit was 11/13/2019.  CT abd and pelvis 11/02/2019:  IMPRESSION: 1. The patient is status post Whipple procedure. 2. No evidence of acute pancreatitis.  No acute abnormality  Still drinking alcohol according to labs  Needs tramadol and wellbutrin RF.  Has pain management contract with Brent Rogers reviewed for me-hasn't had Rx since 07/2019).  He was given #10 tramadol 11/13/2019.   He denies mixing tramadol and alcohol.    Observations/Objective:  NAD   Assessment and Plan: 1. Depression, unspecified depression type Stable-continue - buPROPion (WELLBUTRIN SR) 100 MG 12 hr tablet; Take 1 tablet (100 mg total) by mouth 2 (two) times daily.  Dispense: 60 tablet; Refill: 4  2. Chronic pancreatitis, unspecified pancreatitis type ([place]) F/up PCP for subsequent RF - traMADol (ULTRAM) 50 MG tablet; Take 1 tablet (50 mg total) by mouth every 12 (twelve) hours as needed.   Dispense: 30 tablet; Refill: 0  3. Encounter for examination following treatment at hospital  4. Alcohol abuse I have counseled the patient at length about substance abuse and addiction.  12 step meetings/recovery recommended.  Local 12 step meeting lists were given and attendance was encouraged.  Patient expresses understanding.    Follow Up Instructions: See PCP 2-3 weeks    I discussed the assessment and treatment plan with the patient. The patient was provided an opportunity to ask questions and all were answered. The patient agreed with the plan and demonstrated an understanding of the instructions.   The patient was advised to call back or seek an in-person evaluation if the symptoms worsen or if the condition fails to improve as anticipated.  I provided 13 minutes of non-face-to-face time during this encounter.   Freeman Caldron, PA-C

## 2019-12-27 ENCOUNTER — Emergency Department (HOSPITAL_COMMUNITY)
Admission: EM | Admit: 2019-12-27 | Discharge: 2019-12-28 | Disposition: A | Discharge status: Home / Self Care | Payer: Medicaid Other | Attending: Emergency Medicine | Admitting: Emergency Medicine

## 2019-12-27 ENCOUNTER — Other Ambulatory Visit: Payer: Self-pay

## 2019-12-27 ENCOUNTER — Encounter (HOSPITAL_COMMUNITY): Payer: Self-pay

## 2019-12-27 DIAGNOSIS — R1032 Left lower quadrant pain: Secondary | ICD-10-CM | POA: Diagnosis present

## 2019-12-27 DIAGNOSIS — F17210 Nicotine dependence, cigarettes, uncomplicated: Secondary | ICD-10-CM | POA: Diagnosis not present

## 2019-12-27 DIAGNOSIS — Z90411 Acquired partial absence of pancreas: Secondary | ICD-10-CM | POA: Insufficient documentation

## 2019-12-27 DIAGNOSIS — R112 Nausea with vomiting, unspecified: Secondary | ICD-10-CM | POA: Insufficient documentation

## 2019-12-27 DIAGNOSIS — G8929 Other chronic pain: Secondary | ICD-10-CM

## 2019-12-27 DIAGNOSIS — Z79899 Other long term (current) drug therapy: Secondary | ICD-10-CM | POA: Diagnosis not present

## 2019-12-27 DIAGNOSIS — E860 Dehydration: Secondary | ICD-10-CM

## 2019-12-27 DIAGNOSIS — I10 Essential (primary) hypertension: Secondary | ICD-10-CM | POA: Insufficient documentation

## 2019-12-27 MED ORDER — DICYCLOMINE HCL 10 MG/ML IM SOLN
20.0000 mg | Freq: Once | INTRAMUSCULAR | Status: AC
  Administered 2019-12-28: 20 mg via INTRAMUSCULAR
  Filled 2019-12-27: qty 2

## 2019-12-27 MED ORDER — SODIUM CHLORIDE 0.9 % IV BOLUS
1000.0000 mL | Freq: Once | INTRAVENOUS | Status: AC
  Administered 2019-12-28: 1000 mL via INTRAVENOUS

## 2019-12-27 MED ORDER — FAMOTIDINE IN NACL 20-0.9 MG/50ML-% IV SOLN
20.0000 mg | Freq: Once | INTRAVENOUS | Status: AC
  Administered 2019-12-28: 20 mg via INTRAVENOUS
  Filled 2019-12-27: qty 50

## 2019-12-27 MED ORDER — HALOPERIDOL LACTATE 5 MG/ML IJ SOLN
2.0000 mg | Freq: Once | INTRAMUSCULAR | Status: AC
  Administered 2019-12-28: 2 mg via INTRAVENOUS
  Filled 2019-12-27: qty 1

## 2019-12-27 NOTE — ED Provider Notes (Signed)
[facility] DEPT Provider Note   CSN: [id number] Arrival date & time: 12/27/19  2237   Time seen 11:23 PM  History Chief Complaint  Patient presents with  . Pancreatitis  . Nausea    Brent Rogers is a 54 y.o. male.  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  He states his pain is in the left lower quadrant and radiates into his left back.  He states it started a week ago and has been there constantly and he describes it as intermittently sharp and cramping.  He states eating any food makes it worse.  He states sometimes changing positions will make it feel better for 10 to 15 minutes and then returns.  He has been having nausea and today he has had at least 10 episodes of vomiting that he describes as bright yellow without blood.  He states he had diarrhea a few days ago however that is improved today.  He denies fever.  He states he feels dizzy and lightheaded when he stands up and he generally feels weak like he is dehydrated.  He states he normally only drinks a sixpack of beer a week and he does not drink every day, normally only 1 day a week however today was the day he drank.  He states earlier today he was having some chest pain and felt like he needed to burp.  He states that is why he drank the beer.  He states he has had this before with GERD and he did have a burning sensation.  Patient states he does have a pain contract with [facility].  He gets 20 tramadol every 3 months for his chronic abdominal pain.  He states when he runs out he was told to take extra strength Tylenol.  He states he ran out couple weeks ago.  Patient was given fentanyl 100 mcg IV and 4 mg of Zofran IV by EMS.  He states that helped but only briefly.  PCP Ladell Pier, MD   Past Medical History:  Diagnosis Date  .  Benign tumor of endocrine pancreas   . Chronic pancreatitis ([place])    S/P Whipple  . Foot ulcer with fat layer exposed ([place])   . Hypertension   . Migraine    "@ least once/month" (11/12/2015)  . Pancreatic abnormality    CT  shows mass  . Pneumonia 11/12/2015  . Scoliosis     Patient Active Problem List   Diagnosis Date Noted  . 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 Chronic pain syndrome 06/07/2019  . Depression 06/07/2019  . Chronic pancreatitis ([place]) 05/01/2017  . Foot callus 12/25/2015  . Onychomycosis of toenail 12/25/2015  . Fatty liver 11/12/2015  . GERD (gastroesophageal reflux disease) 11/12/2015  . HTN (hypertension) 07/10/2015  . S/P cholecystectomy 07/10/2015  . Tobacco abuse 12/26/2014  . Protein-calorie malnutrition, severe ([place]) 11/20/2014  . Recurrent pancreatitis 06/06/2014  . Exocrine pancreatic insufficiency ([place]) 12/23/2013    Past Surgical History:  Procedure Laterality Date  . BACK SURGERY    . EUS N/A 09/21/2013   Procedure: ESOPHAGEAL ENDOSCOPIC ULTRASOUND (EUS) RADIAL;  Surgeon: Beryle Beams, MD;  Location: [facility] END[facility]COPY;  Service: Endoscopy;  Laterality: N/A;  . EUS N/A 09/30/2013   Procedure: UPPER ENDOSCOPIC ULTRASOUND (EUS) LINEAR;  Surgeon: Beryle Beams,  MD;  Location: [facility] ENDOSCOPY;  Service: Endoscopy;  Laterality: N/A;  . FINE NEEDLE ASPIRATION N/A 09/21/2013   Procedure: FINE NEEDLE ASPIRATION (FNA) LINEAR;  Surgeon: Beryle Beams, MD;  Location: [facility] END[facility]COPY;  Service: Endoscopy;  Laterality: N/A;  . FINGER FRACTURE SURGERY Left 1995   5th digit  . FRACTURE SURGERY    . KNEE ARTHROSCOPY Bilateral 1987-1989   right-left  . LAPAROSCOPY N/A 12/01/2013   Procedure: LAPAROSCOPY DIAGNOSTIC PANCREATICODUODENECTOMY WITH BILIARY AND PANCREATIC STENTS;  Surgeon: Stark Klein, MD;  Location: [facility] ORS;  Service: General;  Laterality: N/A;  . LUMBAR [place] SURGERY  1990's  . WHIPPLE PROCEDURE  12/01/2013       Family History  Problem Relation  Age of Onset  . Cancer Mother        unsure  . COPD Mother   . Hypertension Mother   . Hypertension Father   . COPD Father   . CAD Sister        possible has stent per pt    Social History   Tobacco Use  . Smoking status: Current Every Day Smoker    Packs/day: 0.25    Years: 30.00    Pack years: 7.50    Types: Cigarettes  . Smokeless tobacco: Former Systems developer    Types: Snuff  . Tobacco comment: "used snuff 10-15 years in my 20s-30s"  Substance Use Topics  . Alcohol use: Yes    Alcohol/week: 4.0 standard drinks    Types: 4 Cans of beer per week    Comment: 05/01/16  . Drug use: No    Home Medications Prior to Admission medications   Medication Sig Start Date End Date Taking? Authorizing Provider  acetaminophen (TYLENOL) 500 MG tablet Take 1,000 mg by mouth every 6 (six) hours as needed for mild pain.   Yes [provider]  amLODipine (NORVASC) 10 MG tablet Take 1 tablet (10 mg total) by mouth daily. 06/07/19  Yes Elsie Stain, MD  bismuth subsalicylate (PEPTO BISMOL) 262 MG/15ML suspension Take 30 mLs by mouth every 6 (six) hours as needed for indigestion or diarrhea or loose stools.   Yes [provider]  buPROPion (WELLBUTRIN SR) 100 MG 12 hr tablet Take 1 tablet (100 mg total) by mouth 2 (two) times daily. 11/17/19  Yes McClung, Dionne Bucy, PA-C  guaiFENesin (MUCINEX) 600 MG 12 hr tablet Take 600 mg by mouth 2 (two) times daily as needed for cough.   Yes [provider]  Pancrelipase, Lip-Prot-Amyl, (CREON) 24000-76000 units CPEP 1 tab PO TID with meals Patient taking differently: Take 1 capsule by mouth 3 (three) times daily. 1 tab PO TID with meals 06/07/19  Yes Elsie Stain, MD  pantoprazole (PROTONIX) 40 MG tablet Take 1 tablet (40 mg total) by mouth daily. 11/06/19  Yes Upstill, Shari, PA-C  promethazine (PHENERGAN) 25 MG tablet TAKE 1 TABLET BY MOUTH EVERY 8 HOURS AS NEEDED FOR NAUSEA OR VOMITING. Patient taking differently: Take 25 mg by mouth  every 8 (eight) hours as needed for nausea.  06/07/19  Yes Elsie Stain, MD  sucralfate (CARAFATE) 1 g tablet Take 1 tablet (1 g total) by mouth 4 (four) times daily -  with meals and at bedtime. 11/02/19  Yes Montine Circle, PA-C  traMADol (ULTRAM) 50 MG tablet Take 1 tablet (50 mg total) by mouth every 12 (twelve) hours as needed. Patient taking differently: Take 50 mg by mouth every 12 (twelve) hours as needed for moderate pain.  11/17/19  Yes Freeman Caldron M, PA-C  dicyclomine (BENTYL) 20 MG tablet Take 1 po QID prn abdominal pain 12/28/19   Rolland Porter, MD  Multiple Vitamin (MULTIVITAMIN WITH MINERALS) TABS tablet Take 1 tablet by mouth daily. Patient not taking: Reported on 05/17/2019 09/18/18   Kayleen Memos, DO  ondansetron (ZOFRAN ODT) 4 MG disintegrating tablet Take 1 tablet (4 mg total) by mouth every 8 (eight) hours as needed. Patient not taking: Reported on 11/17/2019 11/02/19   Montine Circle, PA-C    Allergies    Patient has no known allergies.  Review of Systems   Review of Systems  All other systems reviewed and are negative.   Physical Exam Updated Vital Signs BP 117/87   Pulse 67   Temp 98.3 F (36.8 C)   Resp 14   Ht 6\' 3"  (1.905 m)   Wt 75 kg   SpO2 100%   BMI 20.67 kg/m   Physical Exam Vitals and nursing note reviewed.  Constitutional:      Appearance: Normal appearance. He is normal weight.  HENT:     Head: Normocephalic and atraumatic.     Right Ear: External ear normal.     Left Ear: External ear normal.     Nose: Nose normal.     Mouth/Throat:     Mouth: Mucous membranes are dry.  Eyes:     Extraocular Movements: Extraocular movements intact.     Conjunctiva/sclera: Conjunctivae normal.     Pupils: Pupils are equal, round, and reactive to light.  Cardiovascular:     Rate and Rhythm: Normal rate and regular rhythm.  Pulmonary:     Effort: Pulmonary effort is normal. No respiratory distress.     Breath sounds: Normal breath sounds.    Abdominal:     General: Abdomen is flat. Bowel sounds are normal.     Palpations: Abdomen is soft.     Tenderness: There is abdominal tenderness. There is no guarding or rebound.       Comments: Patient has a well-healed long midline surgical scar consistent with his prior history  Musculoskeletal:        General: Normal range of motion.  Skin:    General: Skin is warm and dry.  Neurological:     General: No focal deficit present.     Mental Status: He is alert and oriented to person, place, and time.     Cranial Nerves: No cranial nerve deficit.  Psychiatric:        Mood and Affect: Mood normal.        Behavior: Behavior normal.        Thought Content: Thought content normal.     ED Results / Procedures / Treatments   Labs (all labs ordered are listed, but only abnormal results are displayed) Results for orders placed or performed during the hospital encounter of 12/27/19  Comprehensive metabolic panel  Result Value Ref Range   Sodium 135 135 - 145 mmol/L   Potassium 3.5 3.5 - 5.1 mmol/L   Chloride 107 98 - 111 mmol/L   CO2 21 (L) 22 - 32 mmol/L   Glucose, Bld 94 70 - 99 mg/dL   BUN 6 6 - 20 mg/dL   Creatinine, Ser 0.56 (L) 0.61 - 1.24 mg/dL   Calcium 7.4 (L) 8.9 - 10.3 mg/dL   Total Protein 5.4 (L) 6.5 - 8.1 g/dL   Albumin 3.0 (L) 3.5 - 5.0 g/dL   AST 18 15 - 41 U/L   ALT  11 0 - 44 U/L   Alkaline Phosphatase 106 38 - 126 U/L   Total Bilirubin 0.5 0.3 - 1.2 mg/dL   GFR calc non Af Amer >60 >60 mL/min   GFR calc Af Amer >60 >60 mL/min   Anion gap 7 5 - 15  Ethanol  Result Value Ref Range   Alcohol, Ethyl (B) 15 (H) <10 mg/dL  Lipase, blood  Result Value Ref Range   Lipase 13 11 - 51 U/L  CBC with Differential  Result Value Ref Range   WBC 7.0 4.0 - 10.5 K/uL   RBC 4.21 (L) 4.22 - 5.81 MIL/uL   Hemoglobin 11.6 (L) 13.0 - 17.0 g/dL   HCT 37.1 (L) 39.0 - 52.0 %   MCV 88.1 80.0 - 100.0 fL   MCH 27.6 26.0 - 34.0 pg   MCHC 31.3 30.0 - 36.0 g/dL   RDW 16.4 (H)  11.5 - 15.5 %   Platelets 235 150 - 400 K/uL   nRBC 0.0 0.0 - 0.2 %   Neutrophils Relative % 60 %   Neutro Abs 4.3 1.7 - 7.7 K/uL   Lymphocytes Relative 24 %   Lymphs Abs 1.7 0.7 - 4.0 K/uL   Monocytes Relative 12 %   Monocytes Absolute 0.8 0.1 - 1.0 K/uL   Eosinophils Relative 2 %   Eosinophils Absolute 0.1 0.0 - 0.5 K/uL   Basophils Relative 1 %   Basophils Absolute 0.1 0.0 - 0.1 K/uL   Immature Granulocytes 1 %   Abs Immature Granulocytes 0.06 0.00 - 0.07 K/uL  Urine rapid drug screen (hosp performed)  Result Value Ref Range   Opiates NONE DETECTED NONE DETECTED   Cocaine NONE DETECTED NONE DETECTED   Benzodiazepines NONE DETECTED NONE DETECTED   Amphetamines NONE DETECTED NONE DETECTED   Tetrahydrocannabinol NONE DETECTED NONE DETECTED   Barbiturates NONE DETECTED NONE DETECTED  Urinalysis, Routine w reflex microscopic  Result Value Ref Range   Color, Urine STRAW (A) YELLOW   APPearance CLEAR CLEAR   Specific Gravity, Urine 1.004 (L) 1.005 - 1.030   pH 6.0 5.0 - 8.0   Glucose, UA NEGATIVE NEGATIVE mg/dL   Hgb urine dipstick NEGATIVE NEGATIVE   Bilirubin Urine NEGATIVE NEGATIVE   Ketones, ur NEGATIVE NEGATIVE mg/dL   Protein, ur NEGATIVE NEGATIVE mg/dL   Nitrite NEGATIVE NEGATIVE   Leukocytes,Ua NEGATIVE NEGATIVE  Troponin I (High Sensitivity)  Result Value Ref Range   Troponin I (High Sensitivity) 4 <18 ng/L   Laboratory interpretation all normal except new mild anemia    EKG EKG Interpretation  Date/Time:  Wednesday December 28 2019 00:37:56 EDT Ventricular Rate:  62 PR Interval:    QRS Duration: 87 QT Interval:  434 QTC Calculation: 441 R Axis:   72 Text Interpretation: Sinus rhythm LAE, consider biatrial enlargement Anteroseptal infarct, age indeterminate Minimal ST elevation, inferior leads No significant change since last tracing 29 Oct 2019 Confirmed by Rolland Porter [phone]) on 12/28/2019 1:30:26 AM   Radiology No results found.  Procedures Procedures  (including critical care time)  Medications Ordered in ED Medications  sodium chloride 0.9 % bolus 1,000 mL (1,000 mLs Intravenous New Bag/Given 12/28/19 0004)  sodium chloride 0.9 % bolus 1,000 mL (0 mLs Intravenous Stopped 12/28/19 0122)  dicyclomine (BENTYL) injection 20 mg (20 mg Intramuscular Given 12/28/19 0008)  haloperidol lactate (HALDOL) injection 2 mg (2 mg Intravenous Given 12/28/19 0001)  famotidine (PEPCID) IVPB 20 mg premix (0 mg Intravenous Stopped 12/28/19 0109)    ED  Course  I have reviewed the triage vital signs and the nursing notes.  Pertinent labs & imaging results that were available during my care of the patient were reviewed by me and considered in my medical decision making (see chart for details).    MDM Rules/Calculators/A&P                      Patient was given IV fluids.  He was given Bentyl IM and low-dose Haldol.  EKG was done to assess his QTC with the Haldol.  He was given IV Pepcid for his complaints of heartburn ear earlier in the day.  Troponin was added to his blood work.  Recheck at 1:30 AM patient is starting his second liter IV fluid.  He states he feels much better and he feels ready to be discharged.  We discussed discharge after he finishes his IV fluids and he is agreeable.  We also discussed his new mild anemia and he denies seeing any obvious bleeding in his stools.  He will be referred for GI.  He could have some occult bleeding.  Recheck at 215, patient's IV fluids have finished.  He states he feels ready to be discharged.  Review of the [place] shows patient got #30 tramadol 50 mg tablets on February 18, he got 15 tablets on January 31, and 60 tablets on November 20.  Final Clinical Impression(s) / ED Diagnoses Final diagnoses:  Chronic LLQ pain  Dehydration    Rx / DC Orders ED Discharge Orders         Ordered    dicyclomine (BENTYL) 20 MG tablet     12/28/19 0225         Plan discharge  Rolland Porter, MD,  Barbette Or, MD 12/28/19 [phone]

## 2019-12-27 NOTE — ED Triage Notes (Signed)
Patient in to ED from home for possible Pancreatitis flare up has history of same, states pain began about a week ago along with N/V, lower left abdominal pain  EMS MEDs 4mg  zofran 156mcg fentanyl  EMS vitals 130/76 94Hr 16R 100RA 98.52F

## 2019-12-28 LAB — CBC WITH DIFFERENTIAL/PLATELET
Abs Immature Granulocytes: 0.06 10*3/uL (ref 0.00–0.07)
Basophils Absolute: 0.1 10*3/uL (ref 0.0–0.1)
Basophils Relative: 1 %
Eosinophils Absolute: 0.1 10*3/uL (ref 0.0–0.5)
Eosinophils Relative: 2 %
HCT: 37.1 % — ABNORMAL LOW (ref 39.0–52.0)
Hemoglobin: 11.6 g/dL — ABNORMAL LOW (ref 13.0–17.0)
Immature Granulocytes: 1 %
Lymphocytes Relative: 24 %
Lymphs Abs: 1.7 10*3/uL (ref 0.7–4.0)
MCH: 27.6 pg (ref 26.0–34.0)
MCHC: 31.3 g/dL (ref 30.0–36.0)
MCV: 88.1 fL (ref 80.0–100.0)
Monocytes Absolute: 0.8 10*3/uL (ref 0.1–1.0)
Monocytes Relative: 12 %
Neutro Abs: 4.3 10*3/uL (ref 1.7–7.7)
Neutrophils Relative %: 60 %
Platelets: 235 10*3/uL (ref 150–400)
RBC: 4.21 MIL/uL — ABNORMAL LOW (ref 4.22–5.81)
RDW: 16.4 % — ABNORMAL HIGH (ref 11.5–15.5)
WBC: 7 10*3/uL (ref 4.0–10.5)
nRBC: 0 % (ref 0.0–0.2)

## 2019-12-28 LAB — COMPREHENSIVE METABOLIC PANEL
ALT: 11 U/L (ref 0–44)
AST: 18 U/L (ref 15–41)
Albumin: 3 g/dL — ABNORMAL LOW (ref 3.5–5.0)
Alkaline Phosphatase: 106 U/L (ref 38–126)
Anion gap: 7 (ref 5–15)
BUN: 6 mg/dL (ref 6–20)
CO2: 21 mmol/L — ABNORMAL LOW (ref 22–32)
Calcium: 7.4 mg/dL — ABNORMAL LOW (ref 8.9–10.3)
Chloride: 107 mmol/L (ref 98–111)
Creatinine, Ser: 0.56 mg/dL — ABNORMAL LOW (ref 0.61–1.24)
GFR calc Af Amer: >60 mL/min (ref >60)
GFR calc non Af Amer: >60 mL/min (ref >60)
Glucose, Bld: 94 mg/dL (ref 70–99)
Potassium: 3.5 mmol/L (ref 3.5–5.1)
Sodium: 135 mmol/L (ref 135–145)
Total Bilirubin: 0.5 mg/dL (ref 0.3–1.2)
Total Protein: 5.4 g/dL — ABNORMAL LOW (ref 6.5–8.1)

## 2019-12-28 LAB — RAPID URINE DRUG SCREEN, HOSP PERFORMED
Amphetamines: NOT DETECTED
Barbiturates: NOT DETECTED
Benzodiazepines: NOT DETECTED
Cocaine: NOT DETECTED
Opiates: NOT DETECTED
Tetrahydrocannabinol: NOT DETECTED

## 2019-12-28 LAB — URINALYSIS, ROUTINE W REFLEX MICROSCOPIC
Bilirubin Urine: NEGATIVE
Glucose, UA: NEGATIVE mg/dL
Hgb urine dipstick: NEGATIVE
Ketones, ur: NEGATIVE mg/dL
Leukocytes,Ua: NEGATIVE
Nitrite: NEGATIVE
Protein, ur: NEGATIVE mg/dL
Specific Gravity, Urine: 1.004 — ABNORMAL LOW (ref 1.005–1.030)
pH: 6 (ref 5.0–8.0)

## 2019-12-28 LAB — ETHANOL: Alcohol, Ethyl (B): 15 mg/dL — ABNORMAL HIGH (ref <10)

## 2019-12-28 LAB — LIPASE, BLOOD: Lipase: 13 U/L (ref 11–51)

## 2019-12-28 LAB — TROPONIN I (HIGH SENSITIVITY): Troponin I (High Sensitivity): 4 ng/L (ref <18)

## 2019-12-28 MED ORDER — DICYCLOMINE HCL 20 MG PO TABS: ORAL_TABLET | ORAL | 0 refills | Status: DC

## 2019-12-28 MED FILL — DICYCLOMINE 20 MG TABLET: 20 | 7 days supply | Qty: 30 | Fill #0

## 2019-12-28 NOTE — Discharge Instructions (Addendum)
Drink plenty of fluids so you do not get dehydrated.  You should call [facility] to get a follow-up appointment to evaluate your new mild anemia.  You may have a slow amount of bleeding that you are unaware of from your intestines.  Try the dicyclomine for your abdominal pain if it returns.

## 2020-01-02 MED FILL — PROMETHAZINE 25 MG TABLET: 25 | 6 days supply | Qty: 20 | Fill #0

## 2020-01-13 ENCOUNTER — Other Ambulatory Visit: Payer: Self-pay

## 2020-01-13 ENCOUNTER — Emergency Department (HOSPITAL_COMMUNITY)
Admission: EM | Admit: 2020-01-13 | Discharge: 2020-01-14 | Discharge status: Against Medical Advice | Payer: Medicaid Other | Attending: Emergency Medicine | Admitting: Emergency Medicine

## 2020-01-13 ENCOUNTER — Encounter (HOSPITAL_COMMUNITY): Payer: Self-pay | Admitting: Emergency Medicine

## 2020-01-13 DIAGNOSIS — M791 Myalgia, unspecified site: Secondary | ICD-10-CM | POA: Diagnosis present

## 2020-01-13 DIAGNOSIS — Z5321 Procedure and treatment not carried out due to patient leaving prior to being seen by health care provider: Secondary | ICD-10-CM | POA: Diagnosis not present

## 2020-01-13 LAB — CBC WITH DIFFERENTIAL/PLATELET
Abs Immature Granulocytes: 0.03 10*3/uL (ref 0.00–0.07)
Basophils Absolute: 0.1 10*3/uL (ref 0.0–0.1)
Basophils Relative: 1 %
Eosinophils Absolute: 0.1 10*3/uL (ref 0.0–0.5)
Eosinophils Relative: 1 %
HCT: 41.2 % (ref 39.0–52.0)
Hemoglobin: 13.1 g/dL (ref 13.0–17.0)
Immature Granulocytes: 0 %
Lymphocytes Relative: 23 %
Lymphs Abs: 1.9 10*3/uL (ref 0.7–4.0)
MCH: 27.8 pg (ref 26.0–34.0)
MCHC: 31.8 g/dL (ref 30.0–36.0)
MCV: 87.3 fL (ref 80.0–100.0)
Monocytes Absolute: 0.9 10*3/uL (ref 0.1–1.0)
Monocytes Relative: 11 %
Neutro Abs: 5.4 10*3/uL (ref 1.7–7.7)
Neutrophils Relative %: 64 %
Platelets: 281 10*3/uL (ref 150–400)
RBC: 4.72 MIL/uL (ref 4.22–5.81)
RDW: 16.5 % — ABNORMAL HIGH (ref 11.5–15.5)
WBC: 8.4 10*3/uL (ref 4.0–10.5)
nRBC: 0 % (ref 0.0–0.2)

## 2020-01-13 NOTE — ED Triage Notes (Signed)
Pt brought to ED by [facility] for left side body chronic body aches getting worse today, pt has taken tramadol, tylenol and bear for pain with no relief. Pt states he is having nausea vomiting and bloody stools. BP 128/70, HR 80, SPO2 100 RA, CBG 151. SR on the monitor.

## 2020-01-14 LAB — COMPREHENSIVE METABOLIC PANEL
ALT: 17 U/L (ref 0–44)
AST: 28 U/L (ref 15–41)
Albumin: 3.3 g/dL — ABNORMAL LOW (ref 3.5–5.0)
Alkaline Phosphatase: 129 U/L — ABNORMAL HIGH (ref 38–126)
Anion gap: 9 (ref 5–15)
BUN: <5 mg/dL — ABNORMAL LOW (ref 6–20)
CO2: 23 mmol/L (ref 22–32)
Calcium: 8.6 mg/dL — ABNORMAL LOW (ref 8.9–10.3)
Chloride: 102 mmol/L (ref 98–111)
Creatinine, Ser: 0.76 mg/dL (ref 0.61–1.24)
GFR calc Af Amer: >60 mL/min (ref >60)
GFR calc non Af Amer: >60 mL/min (ref >60)
Glucose, Bld: 100 mg/dL — ABNORMAL HIGH (ref 70–99)
Potassium: 3.7 mmol/L (ref 3.5–5.1)
Sodium: 134 mmol/L — ABNORMAL LOW (ref 135–145)
Total Bilirubin: 0.5 mg/dL (ref 0.3–1.2)
Total Protein: 6.1 g/dL — ABNORMAL LOW (ref 6.5–8.1)

## 2020-01-14 NOTE — ED Notes (Signed)
Called for vitals x3, no answer. 

## 2020-01-24 ENCOUNTER — Encounter (HOSPITAL_COMMUNITY): Payer: Self-pay | Admitting: Emergency Medicine

## 2020-01-24 ENCOUNTER — Emergency Department (HOSPITAL_COMMUNITY): Payer: Medicaid Other

## 2020-01-24 ENCOUNTER — Other Ambulatory Visit: Payer: Self-pay

## 2020-01-24 ENCOUNTER — Emergency Department (HOSPITAL_COMMUNITY)
Admission: EM | Admit: 2020-01-24 | Discharge: 2020-01-25 | Disposition: A | Discharge status: Home / Self Care | Payer: Medicaid Other | Attending: Emergency Medicine | Admitting: Emergency Medicine

## 2020-01-24 DIAGNOSIS — Z5321 Procedure and treatment not carried out due to patient leaving prior to being seen by health care provider: Secondary | ICD-10-CM | POA: Insufficient documentation

## 2020-01-24 DIAGNOSIS — R079 Chest pain, unspecified: Secondary | ICD-10-CM | POA: Insufficient documentation

## 2020-01-24 LAB — CBC
HCT: 45.7 % (ref 39.0–52.0)
Hemoglobin: 14.5 g/dL (ref 13.0–17.0)
MCH: 27.3 pg (ref 26.0–34.0)
MCHC: 31.7 g/dL (ref 30.0–36.0)
MCV: 86.1 fL (ref 80.0–100.0)
Platelets: 325 10*3/uL (ref 150–400)
RBC: 5.31 MIL/uL (ref 4.22–5.81)
RDW: 16.1 % — ABNORMAL HIGH (ref 11.5–15.5)
WBC: 5.9 10*3/uL (ref 4.0–10.5)
nRBC: 0 % (ref 0.0–0.2)

## 2020-01-24 LAB — BASIC METABOLIC PANEL
Anion gap: 8 (ref 5–15)
BUN: <5 mg/dL — ABNORMAL LOW (ref 6–20)
CO2: 25 mmol/L (ref 22–32)
Calcium: 8.7 mg/dL — ABNORMAL LOW (ref 8.9–10.3)
Chloride: 102 mmol/L (ref 98–111)
Creatinine, Ser: 0.78 mg/dL (ref 0.61–1.24)
GFR calc Af Amer: >60 mL/min (ref >60)
GFR calc non Af Amer: >60 mL/min (ref >60)
Glucose, Bld: 113 mg/dL — ABNORMAL HIGH (ref 70–99)
Potassium: 4.7 mmol/L (ref 3.5–5.1)
Sodium: 135 mmol/L (ref 135–145)

## 2020-01-24 LAB — TROPONIN I (HIGH SENSITIVITY): Troponin I (High Sensitivity): <2 ng/L (ref <18)

## 2020-01-24 LAB — LIPASE, BLOOD: Lipase: 15 U/L (ref 11–51)

## 2020-01-24 NOTE — ED Triage Notes (Signed)
Pt reports chronic pancreatitis, and has had left sided chest, abd and back pain for a few days. Endorses N/V.

## 2020-01-29 ENCOUNTER — Encounter (HOSPITAL_COMMUNITY): Payer: Self-pay

## 2020-01-29 ENCOUNTER — Emergency Department (HOSPITAL_COMMUNITY)
Admission: EM | Admit: 2020-01-29 | Discharge: 2020-01-29 | Disposition: A | Discharge status: Home / Self Care | Payer: Medicaid Other | Attending: Emergency Medicine | Admitting: Emergency Medicine

## 2020-01-29 ENCOUNTER — Emergency Department (HOSPITAL_COMMUNITY): Payer: Medicaid Other

## 2020-01-29 DIAGNOSIS — R197 Diarrhea, unspecified: Secondary | ICD-10-CM | POA: Diagnosis not present

## 2020-01-29 DIAGNOSIS — R112 Nausea with vomiting, unspecified: Secondary | ICD-10-CM | POA: Diagnosis not present

## 2020-01-29 DIAGNOSIS — Z5321 Procedure and treatment not carried out due to patient leaving prior to being seen by health care provider: Secondary | ICD-10-CM | POA: Diagnosis not present

## 2020-01-29 DIAGNOSIS — R0789 Other chest pain: Secondary | ICD-10-CM | POA: Diagnosis present

## 2020-01-29 LAB — COMPREHENSIVE METABOLIC PANEL
ALT: 25 U/L (ref 0–44)
AST: 43 U/L — ABNORMAL HIGH (ref 15–41)
Albumin: 3.4 g/dL — ABNORMAL LOW (ref 3.5–5.0)
Alkaline Phosphatase: 162 U/L — ABNORMAL HIGH (ref 38–126)
Anion gap: 9 (ref 5–15)
BUN: <5 mg/dL — ABNORMAL LOW (ref 6–20)
CO2: 26 mmol/L (ref 22–32)
Calcium: 8.6 mg/dL — ABNORMAL LOW (ref 8.9–10.3)
Chloride: 102 mmol/L (ref 98–111)
Creatinine, Ser: 0.83 mg/dL (ref 0.61–1.24)
GFR calc Af Amer: >60 mL/min (ref >60)
GFR calc non Af Amer: >60 mL/min (ref >60)
Glucose, Bld: 121 mg/dL — ABNORMAL HIGH (ref 70–99)
Potassium: 4.3 mmol/L (ref 3.5–5.1)
Sodium: 137 mmol/L (ref 135–145)
Total Bilirubin: 0.5 mg/dL (ref 0.3–1.2)
Total Protein: 6.5 g/dL (ref 6.5–8.1)

## 2020-01-29 LAB — CBC
HCT: 42.2 % (ref 39.0–52.0)
Hemoglobin: 13.7 g/dL (ref 13.0–17.0)
MCH: 27.8 pg (ref 26.0–34.0)
MCHC: 32.5 g/dL (ref 30.0–36.0)
MCV: 85.6 fL (ref 80.0–100.0)
Platelets: 231 10*3/uL (ref 150–400)
RBC: 4.93 MIL/uL (ref 4.22–5.81)
RDW: 16 % — ABNORMAL HIGH (ref 11.5–15.5)
WBC: 6.3 10*3/uL (ref 4.0–10.5)
nRBC: 0 % (ref 0.0–0.2)

## 2020-01-29 LAB — LIPASE, BLOOD: Lipase: 21 U/L (ref 11–51)

## 2020-01-29 LAB — TROPONIN I (HIGH SENSITIVITY): Troponin I (High Sensitivity): 3 ng/L (ref <18)

## 2020-01-29 MED ORDER — SODIUM CHLORIDE 0.9% FLUSH: 3.0000 mL | Freq: Once | INTRAVENOUS | Status: DC

## 2020-01-29 NOTE — ED Notes (Signed)
Pt LWBS. Pt was tired of waiting.

## 2020-01-29 NOTE — ED Triage Notes (Signed)
Onset 1 week abd pain, left sided chest pain, back pain, N/V/D.  Pt states his sister died at first of last week and he drank alcohol but had abd pain prior.

## 2020-02-08 ENCOUNTER — Other Ambulatory Visit: Payer: Self-pay

## 2020-02-08 ENCOUNTER — Emergency Department (HOSPITAL_COMMUNITY): Payer: Medicaid Other

## 2020-02-08 ENCOUNTER — Encounter (HOSPITAL_COMMUNITY): Payer: Self-pay

## 2020-02-08 ENCOUNTER — Emergency Department (HOSPITAL_COMMUNITY)
Admission: EM | Admit: 2020-02-08 | Discharge: 2020-02-08 | Disposition: A | Discharge status: Home / Self Care | Payer: Medicaid Other | Attending: Emergency Medicine | Admitting: Emergency Medicine

## 2020-02-08 DIAGNOSIS — R112 Nausea with vomiting, unspecified: Secondary | ICD-10-CM | POA: Insufficient documentation

## 2020-02-08 DIAGNOSIS — F17210 Nicotine dependence, cigarettes, uncomplicated: Secondary | ICD-10-CM | POA: Insufficient documentation

## 2020-02-08 DIAGNOSIS — Z79899 Other long term (current) drug therapy: Secondary | ICD-10-CM | POA: Diagnosis not present

## 2020-02-08 DIAGNOSIS — R109 Unspecified abdominal pain: Secondary | ICD-10-CM | POA: Diagnosis present

## 2020-02-08 DIAGNOSIS — I10 Essential (primary) hypertension: Secondary | ICD-10-CM | POA: Diagnosis not present

## 2020-02-08 DIAGNOSIS — R197 Diarrhea, unspecified: Secondary | ICD-10-CM

## 2020-02-08 LAB — CBC WITH DIFFERENTIAL/PLATELET
Abs Immature Granulocytes: 0.01 10*3/uL (ref 0.00–0.07)
Basophils Absolute: 0.1 10*3/uL (ref 0.0–0.1)
Basophils Relative: 1 %
Eosinophils Absolute: 0.1 10*3/uL (ref 0.0–0.5)
Eosinophils Relative: 2 %
HCT: 41.6 % (ref 39.0–52.0)
Hemoglobin: 13.6 g/dL (ref 13.0–17.0)
Immature Granulocytes: 0 %
Lymphocytes Relative: 28 %
Lymphs Abs: 2.1 10*3/uL (ref 0.7–4.0)
MCH: 27.9 pg (ref 26.0–34.0)
MCHC: 32.7 g/dL (ref 30.0–36.0)
MCV: 85.4 fL (ref 80.0–100.0)
Monocytes Absolute: 0.7 10*3/uL (ref 0.1–1.0)
Monocytes Relative: 9 %
Neutro Abs: 4.5 10*3/uL (ref 1.7–7.7)
Neutrophils Relative %: 60 %
Platelets: 203 10*3/uL (ref 150–400)
RBC: 4.87 MIL/uL (ref 4.22–5.81)
RDW: 16.7 % — ABNORMAL HIGH (ref 11.5–15.5)
WBC: 7.4 10*3/uL (ref 4.0–10.5)
nRBC: 0 % (ref 0.0–0.2)

## 2020-02-08 LAB — URINALYSIS, ROUTINE W REFLEX MICROSCOPIC
Bilirubin Urine: NEGATIVE
Glucose, UA: NEGATIVE mg/dL
Hgb urine dipstick: NEGATIVE
Ketones, ur: 5 mg/dL — AB
Leukocytes,Ua: NEGATIVE
Nitrite: NEGATIVE
Protein, ur: NEGATIVE mg/dL
Specific Gravity, Urine: 1.034 — ABNORMAL HIGH (ref 1.005–1.030)
pH: 5 (ref 5.0–8.0)

## 2020-02-08 LAB — COMPREHENSIVE METABOLIC PANEL
ALT: 33 U/L (ref 0–44)
AST: 45 U/L — ABNORMAL HIGH (ref 15–41)
Albumin: 3.8 g/dL (ref 3.5–5.0)
Alkaline Phosphatase: 168 U/L — ABNORMAL HIGH (ref 38–126)
Anion gap: 11 (ref 5–15)
BUN: 6 mg/dL (ref 6–20)
CO2: 24 mmol/L (ref 22–32)
Calcium: 8.6 mg/dL — ABNORMAL LOW (ref 8.9–10.3)
Chloride: 100 mmol/L (ref 98–111)
Creatinine, Ser: 0.8 mg/dL (ref 0.61–1.24)
GFR calc Af Amer: >60 mL/min (ref >60)
GFR calc non Af Amer: >60 mL/min (ref >60)
Glucose, Bld: 117 mg/dL — ABNORMAL HIGH (ref 70–99)
Potassium: 4.4 mmol/L (ref 3.5–5.1)
Sodium: 135 mmol/L (ref 135–145)
Total Bilirubin: 0.4 mg/dL (ref 0.3–1.2)
Total Protein: 6.8 g/dL (ref 6.5–8.1)

## 2020-02-08 LAB — POC OCCULT BLOOD, ED: Fecal Occult Bld: NEGATIVE

## 2020-02-08 LAB — LIPASE, BLOOD: Lipase: 35 U/L (ref 11–51)

## 2020-02-08 MED ORDER — IOHEXOL 300 MG/ML  SOLN
100.0000 mL | Freq: Once | INTRAMUSCULAR | Status: AC | PRN
  Administered 2020-02-08: 100 mL via INTRAVENOUS

## 2020-02-08 MED ORDER — SODIUM CHLORIDE 0.9 % IV BOLUS (SEPSIS)
1000.0000 mL | Freq: Once | INTRAVENOUS | Status: AC
  Administered 2020-02-08: 1000 mL via INTRAVENOUS

## 2020-02-08 MED ORDER — ONDANSETRON HCL 4 MG/2ML IJ SOLN
4.0000 mg | Freq: Once | INTRAMUSCULAR | Status: AC
  Administered 2020-02-08: 4 mg via INTRAVENOUS
  Filled 2020-02-08: qty 2

## 2020-02-08 MED ORDER — HYDROMORPHONE HCL 1 MG/ML IJ SOLN
1.0000 mg | Freq: Once | INTRAMUSCULAR | Status: AC
  Administered 2020-02-08: 1 mg via INTRAVENOUS
  Filled 2020-02-08: qty 1

## 2020-02-08 MED ORDER — ONDANSETRON 4 MG PO TBDP
4.0000 mg | ORAL_TABLET | Freq: Four times a day (QID) | ORAL | 0 refills | Status: DC | PRN
Start: 2020-02-08 — End: 2020-05-29

## 2020-02-08 MED ORDER — DICYCLOMINE HCL 20 MG PO TABS
20.0000 mg | ORAL_TABLET | Freq: Three times a day (TID) | ORAL | 0 refills | Status: DC | PRN
Start: 2020-02-08 — End: 2020-05-29

## 2020-02-08 MED ORDER — DICYCLOMINE HCL 10 MG/ML IM SOLN: 20.0000 mg | Freq: Once | INTRAMUSCULAR | Status: DC

## 2020-02-08 MED ORDER — SODIUM CHLORIDE (PF) 0.9 % IJ SOLN
INTRAMUSCULAR | Status: AC
  Filled 2020-02-08: qty 50

## 2020-02-08 NOTE — ED Triage Notes (Signed)
Pt brought by EMS due to abdominal pain x 1 week with no relief. Pt states he thinks it is a pancreatitis flare up. Pt tried to go to the hospital earlier this week, but left due to the wait time.

## 2020-02-08 NOTE — Discharge Instructions (Signed)
You may alternate Tylenol 1000 mg every 6 hours as needed for pain, fever and Ibuprofen 800 mg every 8 hours as needed for pain, fever.  Please take Ibuprofen with food.  Do not take more than 4000 mg of Tylenol (acetaminophen) in a 24 hour period.  You may use over-the-counter Imodium as needed for diarrhea.  Your labs, urine, CT scan were normal.  There was no blood in your stool.  This is likely a viral illness causing your symptoms.  You do not need antibiotics.

## 2020-02-08 NOTE — ED Provider Notes (Signed)
CHIEF COMPLAINT: Abdominal pain, nausea and vomiting, diarrhea  HPI: Patient is a 54 year old male with history of chronic pancreatitis status post Whipple procedure, continued alcohol use who presents to the emergency department with complaints of diffuse abdominal pain, nausea, vomiting and diarrhea that started tonight.  He states he has noticed small amount of blood in his stool.  He reports this feels similar to his previous pancreatitis.  He denies any known fevers or chills.  Reports that he drank alcohol yesterday which she thinks triggered his symptoms.  No dysuria, hematuria, penile discharge.  ROS: See HPI Constitutional: no fever  Eyes: no drainage  ENT: no runny nose   Cardiovascular:  no chest pain  Resp: no SOB  GI: + vomiting and diarrhea GU: no dysuria Integumentary: no rash  Allergy: no hives  Musculoskeletal: no leg swelling  Neurological: no slurred speech ROS otherwise negative  PAST MEDICAL HISTORY/PAST SURGICAL HISTORY:  Past Medical History:  Diagnosis Date  . Benign tumor of endocrine pancreas   . Chronic pancreatitis ([place])    S/P Whipple  . Foot ulcer with fat layer exposed ([place])   . Hypertension   . Migraine    "@ least once/month" (11/12/2015)  . Pancreatic abnormality    CT  shows mass  . Pneumonia 11/12/2015  . Scoliosis     MEDICATIONS:  Prior to Admission medications   Medication Sig Start Date End Date Taking? Authorizing Provider  acetaminophen (TYLENOL) 500 MG tablet Take 1,000 mg by mouth every 6 (six) hours as needed for mild pain.    [provider]  amLODipine (NORVASC) 10 MG tablet Take 1 tablet (10 mg total) by mouth daily. 06/07/19   Elsie Stain, MD  bismuth subsalicylate (PEPTO BISMOL) 262 MG/15ML suspension Take 30 mLs by mouth every 6 (six) hours as needed for indigestion or diarrhea or loose stools.    [provider]  buPROPion (WELLBUTRIN SR) 100 MG 12 hr tablet Take 1 tablet (100 mg total) by mouth 2 (two)  times daily. 11/17/19   Argentina Donovan, PA-C  dicyclomine (BENTYL) 20 MG tablet Take 1 po QID prn abdominal pain 12/28/19   Rolland Porter, MD  guaiFENesin (MUCINEX) 600 MG 12 hr tablet Take 600 mg by mouth 2 (two) times daily as needed for cough.    [provider]  Multiple Vitamin (MULTIVITAMIN WITH MINERALS) TABS tablet Take 1 tablet by mouth daily. Patient not taking: Reported on 05/17/2019 09/18/18   Kayleen Memos, DO  ondansetron (ZOFRAN ODT) 4 MG disintegrating tablet Take 1 tablet (4 mg total) by mouth every 8 (eight) hours as needed. Patient not taking: Reported on 11/17/2019 11/02/19   Montine Circle, PA-C  Pancrelipase, Lip-Prot-Amyl, (CREON) 24000-76000 units CPEP 1 tab PO TID with meals Patient taking differently: Take 1 capsule by mouth 3 (three) times daily. 1 tab PO TID with meals 06/07/19   Elsie Stain, MD  pantoprazole (PROTONIX) 40 MG tablet Take 1 tablet (40 mg total) by mouth daily. 11/06/19   Charlann Lange, PA-C  promethazine (PHENERGAN) 25 MG tablet TAKE 1 TABLET BY MOUTH EVERY 8 HOURS AS NEEDED FOR NAUSEA OR VOMITING. Patient taking differently: Take 25 mg by mouth every 8 (eight) hours as needed for nausea.  06/07/19   Elsie Stain, MD  sucralfate (CARAFATE) 1 g tablet Take 1 tablet (1 g total) by mouth 4 (four) times daily -  with meals and at bedtime. 11/02/19   Montine Circle, PA-C  traMADol Veatrice Bourbon) 50  MG tablet Take 1 tablet (50 mg total) by mouth every 12 (twelve) hours as needed. Patient taking differently: Take 50 mg by mouth every 12 (twelve) hours as needed for moderate pain.  11/17/19   Argentina Donovan, PA-C    ALLERGIES:  No Known Allergies  SOCIAL HISTORY:  Social History   Tobacco Use  . Smoking status: Current Every Day Smoker    Packs/day: 0.25    Years: 30.00    Pack years: 7.50    Types: Cigarettes  . Smokeless tobacco: Former Systems developer    Types: Snuff  . Tobacco comment: "used snuff 10-15 years in my 20s-30s"  Substance Use Topics   . Alcohol use: Yes    Alcohol/week: 4.0 standard drinks    Types: 4 Cans of beer per week    Comment: 05/01/16    FAMILY HISTORY: Family History  Problem Relation Age of Onset  . Cancer Mother        unsure  . COPD Mother   . Hypertension Mother   . Hypertension Father   . COPD Father   . CAD Sister        possible has stent per pt    EXAM: BP 113/88 (BP Location: Right Arm)   Pulse 78   Temp 98.2 F (36.8 C) (Oral)   Ht 6\' 3"  (1.905 m)   SpO2 100%   BMI 20.67 kg/m  CONSTITUTIONAL: Alert and oriented and responds appropriately to questions.  Chronically ill-appearing HEAD: Normocephalic EYES: Conjunctivae clear, pupils appear equal, EOM appear intact ENT: normal nose; moist mucous membranes NECK: Supple, normal ROM CARD: RRR; S1 and S2 appreciated; no murmurs, no clicks, no rubs, no gallops RESP: Normal chest excursion without splinting or tachypnea; breath sounds clear and equal bilaterally; no wheezes, no rhonchi, no rales, no hypoxia or respiratory distress, speaking full sentences ABD/GI: Normal bowel sounds; non-distended; soft, diffusely tender throughout the abdomen with large previous surgical scar from Whipple procedure, no rebound, no guarding, no peritoneal signs, no hepatosplenomegaly BACK:  The back appears normal EXT: Normal ROM in all joints; no deformity noted, no edema; no cyanosis SKIN: Normal color for age and race; warm; no rash on exposed skin NEURO: Moves all extremities equally PSYCH: The patient's mood and manner are appropriate.   MEDICAL DECISION MAKING: Patient here with complaints of abdominal pain, vomiting diarrhea.  Differential includes pancreatitis, colitis, diverticulitis, appendicitis, bowel obstruction, cholecystitis.  Will obtain labs, urine and CT of the abdomen pelvis.  He reports seeing some blood in his stool.  He feels warm to palpation on exam.  Will obtain rectal temperature along with Hemoccult.  Will give IV fluids, pain and  nausea medicine.  ED PROGRESS: Patient's labs, urine, CT of the abdomen pelvis reviewed/interpreted.  No acute abnormality noted.  Normal lipase and LFTs.  No leukocytosis.  Hemoccult negative.  Urine shows no sign of infection or significant dehydration.  CT scan shows no acute abnormality.  Suspect viral gastroenteritis causing patient's symptoms.  He has been able to tolerate p.o. here.  Will discharge with Zofran and Bentyl.   At this time, I do not feel there is any life-threatening condition present. I have reviewed, interpreted and discussed all results (EKG, imaging, lab, urine as appropriate) and exam findings with patient/family. I have reviewed nursing notes and appropriate previous records.  I feel the patient is safe to be discharged home without further emergent workup and can continue workup as an outpatient as needed. Discussed usual and customary return  precautions. Patient/family verbalize understanding and are comfortable with this plan.  Outpatient follow-up has been provided as needed. All questions have been answered.     Brent Rogers was evaluated in Emergency Department on 02/08/2020 for the symptoms described in the history of present illness. He was evaluated in the context of the global COVID-19 pandemic, which necessitated consideration that the patient might be at risk for infection with the SARS-CoV-2 virus that causes COVID-19. Institutional protocols and algorithms that pertain to the evaluation of patients at risk for COVID-19 are in a state of rapid change based on information released by regulatory bodies including the CDC and federal and state organizations. These policies and algorithms were followed during the patient's care in the ED.      Jaxxen Voong, Delice Bison, DO 02/08/20 [phone]

## 2020-02-08 NOTE — ED Notes (Signed)
Pt given gingerale for PO challenge 

## 2020-03-01 ENCOUNTER — Encounter (HOSPITAL_COMMUNITY): Payer: Self-pay

## 2020-03-01 ENCOUNTER — Other Ambulatory Visit: Payer: Self-pay | Admitting: Physician Assistant

## 2020-03-01 ENCOUNTER — Emergency Department (HOSPITAL_COMMUNITY)
Admission: EM | Admit: 2020-03-01 | Discharge: 2020-03-01 | Disposition: A | Discharge status: Home / Self Care | Payer: Medicaid Other | Attending: Emergency Medicine | Admitting: Emergency Medicine

## 2020-03-01 DIAGNOSIS — I10 Essential (primary) hypertension: Secondary | ICD-10-CM | POA: Insufficient documentation

## 2020-03-01 DIAGNOSIS — R1032 Left lower quadrant pain: Secondary | ICD-10-CM

## 2020-03-01 DIAGNOSIS — K861 Other chronic pancreatitis: Secondary | ICD-10-CM

## 2020-03-01 LAB — LIPASE, BLOOD: Lipase: 15 U/L (ref 11–51)

## 2020-03-01 LAB — COMPREHENSIVE METABOLIC PANEL
ALT: 19 U/L (ref 0–44)
AST: 36 U/L (ref 15–41)
Albumin: 3.2 g/dL — ABNORMAL LOW (ref 3.5–5.0)
Alkaline Phosphatase: 117 U/L (ref 38–126)
Anion gap: 9 (ref 5–15)
BUN: 7 mg/dL (ref 6–20)
CO2: 25 mmol/L (ref 22–32)
Calcium: 8.4 mg/dL — ABNORMAL LOW (ref 8.9–10.3)
Chloride: 99 mmol/L (ref 98–111)
Creatinine, Ser: 0.76 mg/dL (ref 0.61–1.24)
GFR calc Af Amer: >60 mL/min (ref >60)
GFR calc non Af Amer: >60 mL/min (ref >60)
Glucose, Bld: 131 mg/dL — ABNORMAL HIGH (ref 70–99)
Potassium: 3.5 mmol/L (ref 3.5–5.1)
Sodium: 133 mmol/L — ABNORMAL LOW (ref 135–145)
Total Bilirubin: 0.6 mg/dL (ref 0.3–1.2)
Total Protein: 6.1 g/dL — ABNORMAL LOW (ref 6.5–8.1)

## 2020-03-01 LAB — URINALYSIS, ROUTINE W REFLEX MICROSCOPIC
Bilirubin Urine: NEGATIVE
Glucose, UA: NEGATIVE mg/dL
Hgb urine dipstick: NEGATIVE
Ketones, ur: NEGATIVE mg/dL
Leukocytes,Ua: NEGATIVE
Nitrite: NEGATIVE
Protein, ur: NEGATIVE mg/dL
Specific Gravity, Urine: 1.025 (ref 1.005–1.030)
pH: 5 (ref 5.0–8.0)

## 2020-03-01 LAB — CBC WITH DIFFERENTIAL/PLATELET
Abs Immature Granulocytes: 0.11 10*3/uL — ABNORMAL HIGH (ref 0.00–0.07)
Basophils Absolute: 0.1 10*3/uL (ref 0.0–0.1)
Basophils Relative: 1 %
Eosinophils Absolute: 0.2 10*3/uL (ref 0.0–0.5)
Eosinophils Relative: 2 %
HCT: 35.8 % — ABNORMAL LOW (ref 39.0–52.0)
Hemoglobin: 11.7 g/dL — ABNORMAL LOW (ref 13.0–17.0)
Immature Granulocytes: 1 %
Lymphocytes Relative: 20 %
Lymphs Abs: 2 10*3/uL (ref 0.7–4.0)
MCH: 28.9 pg (ref 26.0–34.0)
MCHC: 32.7 g/dL (ref 30.0–36.0)
MCV: 88.4 fL (ref 80.0–100.0)
Monocytes Absolute: 0.9 10*3/uL (ref 0.1–1.0)
Monocytes Relative: 9 %
Neutro Abs: 6.7 10*3/uL (ref 1.7–7.7)
Neutrophils Relative %: 67 %
Platelets: 213 10*3/uL (ref 150–400)
RBC: 4.05 MIL/uL — ABNORMAL LOW (ref 4.22–5.81)
RDW: 18.6 % — ABNORMAL HIGH (ref 11.5–15.5)
WBC: 9.9 10*3/uL (ref 4.0–10.5)
nRBC: 0 % (ref 0.0–0.2)

## 2020-03-01 MED ORDER — HYDROMORPHONE HCL 1 MG/ML IJ SOLN
1.0000 mg | Freq: Once | INTRAMUSCULAR | Status: AC
  Administered 2020-03-01: 1 mg via INTRAVENOUS
  Filled 2020-03-01: qty 1

## 2020-03-01 MED ORDER — OXYCODONE-ACETAMINOPHEN 5-325 MG PO TABS
1.0000 | ORAL_TABLET | Freq: Once | ORAL | Status: AC
  Administered 2020-03-01: 1 via ORAL
  Filled 2020-03-01: qty 1

## 2020-03-01 MED ORDER — MORPHINE SULFATE (PF) 4 MG/ML IV SOLN
4.0000 mg | Freq: Once | INTRAVENOUS | Status: AC
  Administered 2020-03-01: 4 mg via INTRAVENOUS
  Filled 2020-03-01: qty 1

## 2020-03-01 MED ORDER — SODIUM CHLORIDE 0.9 % IV BOLUS
500.0000 mL | Freq: Once | INTRAVENOUS | Status: AC
  Administered 2020-03-01: 500 mL via INTRAVENOUS

## 2020-03-01 MED ORDER — ONDANSETRON HCL 4 MG/2ML IJ SOLN
4.0000 mg | Freq: Once | INTRAMUSCULAR | Status: AC
  Administered 2020-03-01: 4 mg via INTRAVENOUS
  Filled 2020-03-01: qty 2

## 2020-03-01 MED FILL — PROMETHAZINE 25 MG TABLET: 25 | 6 days supply | Qty: 20 | Fill #1

## 2020-03-01 MED FILL — traMADol HCL 50 MG TABS: 50 | 2 days supply | Qty: 10 | Fill #0

## 2020-03-01 MED FILL — ?AMLODIPINE BESYL 10MG TABL: 10 | 30 days supply | Qty: 30 | Fill #2

## 2020-03-01 NOTE — ED Provider Notes (Signed)
Signout note  54 year old male acute on chronic abdominal pain.  Labs are reassuring, abdomen soft, suspect likely chronic, low suspicion for acute process today.  7:30 AM received sign out from Pollina, f/u on UA and recheck pt - if reassuring, dc home  Rechecked patient, updated on results, pain controlled, abd remains soft, tolerating PO, reviewed dc/ instructions and return precautions   Lucrezia Starch, MD 03/01/20 1151

## 2020-03-01 NOTE — ED Triage Notes (Signed)
Pt c/o LLQ abdominal pain radiating to back and n/v/d x 1 week.  Pain score 10/10.  Pt reports taking"everything" w/o relief.  Hx of chronic pancreatitis.

## 2020-03-01 NOTE — ED Provider Notes (Signed)
[facility] DEPT Provider Note   CSN: [id number] Arrival date & time: 03/01/20  0515     History Chief Complaint  Patient presents with  . Emesis  . Abdominal Pain  . Diarrhea    B[facility] Rogers is a 54 y.o. male.  Patient presents to the emergency department for evaluation of abdominal pain.  Patient reports that he has been experiencing severe left lower quadrant abdominal pain for approximately 1 week.  He has been taking home medications without relief. He does endorse some nausea, vomiting and diarrhea. Patient does report that this pain is similar to recurring pain that he has experienced in the past.        Past Medical History:  Diagnosis Date  . Benign tumor of endocrine pancreas   . Chronic pancreatitis ([place])    S/P Whipple  . Foot ulcer with fat layer exposed ([place])   . Hypertension   . Migraine    "@ least once/month" (11/12/2015)  . Pancreatic abnormality    CT  shows mass  . Pneumonia 11/12/2015  . Scoliosis     Patient Active Problem List   Diagnosis Date Noted  . 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 Chronic pain syndrome 06/07/2019  . Depression 06/07/2019  . Chronic pancreatitis ([place]) 05/01/2017  . Foot callus 12/25/2015  . Onychomycosis of toenail 12/25/2015  . Fatty liver 11/12/2015  . GERD (gastroesophageal reflux disease) 11/12/2015  . HTN (hypertension) 07/10/2015  . S/P cholecystectomy 07/10/2015  . Tobacco abuse 12/26/2014  . Protein-calorie malnutrition, severe ([place]) 11/20/2014  . Recurrent pancreatitis 06/06/2014  . Exocrine pancreatic insufficiency ([place]) 12/23/2013    Past Surgical History:  Procedure Laterality Date  . BACK SURGERY    . EUS N/A 09/21/2013   Procedure: ESOPHAGEAL ENDOSCOPIC ULTRASOUND (EUS) RADIAL;  Surgeon: Beryle Beams, MD;  [facility];  Service: Endoscopy;  Laterality: N/A;  . EUS N/A 09/30/2013   Procedure: UPPER ENDOSCOPIC ULTRASOUND (EUS) LINEAR;   Surgeon: Beryle Beams, MD;  [facility];  Service: Endoscopy;  Laterality: N/A;  . FINE NEEDLE ASPIRATION N/A 09/21/2013   Procedure: FINE NEEDLE ASPIRATION (FNA) LINEAR;  Surgeon: Beryle Beams, MD;  [facility];  Service: Endoscopy;  Laterality: N/A;  . FINGER FRACTURE SURGERY Left 1995   5th digit  . FRACTURE SURGERY    . KNEE ARTHROSCOPY Bilateral 1987-1989   right-left  . LAPAROSCOPY N/A 12/01/2013   Procedure: LAPAROSCOPY DIAGNOSTIC PANCREATICODUODENECTOMY WITH BILIARY AND PANCREATIC STENTS;  Surgeon: Stark Klein, MD;  Location: [facility];  Service: General;  Laterality: N/A;  . LUMBAR [place] SURGERY  1990's  . WHIPPLE PROCEDURE  12/01/2013       Family History  Problem Relation Age of Onset  . Cancer Mother        unsure  . COPD Mother   . Hypertension Mother   . Hypertension Father   . COPD Father   . CAD Sister        possible has stent per pt    Social History   Tobacco Use  . Smoking status: Current Every Day Smoker    Packs/day: 0.25    Years: 30.00    Pack years: 7.50    Types: Cigarettes  . Smokeless tobacco: Former Systems developer    Types: Snuff  . Tobacco comment: "used snuff 10-15 years in my 20s-30s"  Substance Use Topics  . Alcohol use: Yes    Alcohol/week: 4.0 standard drinks    Types: 4  Cans of beer per week    Comment: 05/01/16  . Drug use: No    Home Medications Prior to Admission medications   Medication Sig Start Date End Date Taking? Authorizing Provider  acetaminophen (TYLENOL) 500 MG tablet Take 1,000 mg by mouth every 6 (six) hours as needed for mild pain.    [provider]  amLODipine (NORVASC) 10 MG tablet Take 1 tablet (10 mg total) by mouth daily. 06/07/19   Elsie Stain, MD  bismuth subsalicylate (PEPTO BISMOL) 262 MG/15ML suspension Take 30 mLs by mouth every 6 (six) hours as needed for indigestion or diarrhea or loose stools.    [provider]  buPROPion (WELLBUTRIN SR) 100 MG 12 hr tablet Take 1  tablet (100 mg total) by mouth 2 (two) times daily. 11/17/19   Argentina Donovan, PA-C  dicyclomine (BENTYL) 20 MG tablet Take 1 po QID prn abdominal pain Patient not taking: Reported on 02/08/2020 12/28/19   Rolland Porter, MD  dicyclomine (BENTYL) 20 MG tablet Take 1 tablet (20 mg total) by mouth every 8 (eight) hours as needed for spasms (Abdominal cramping). 02/08/20   Ward, Delice Bison, DO  guaiFENesin (MUCINEX) 600 MG 12 hr tablet Take 600 mg by mouth 2 (two) times daily as needed for cough.    [provider]  Multiple Vitamin (MULTIVITAMIN WITH MINERALS) TABS tablet Take 1 tablet by mouth daily. Patient not taking: Reported on 05/17/2019 09/18/18   Kayleen Memos, DO  ondansetron (ZOFRAN ODT) 4 MG disintegrating tablet Take 1 tablet (4 mg total) by mouth every 8 (eight) hours as needed. Patient not taking: Reported on 11/17/2019 11/02/19   Montine Circle, PA-C  ondansetron (ZOFRAN ODT) 4 MG disintegrating tablet Take 1 tablet (4 mg total) by mouth every 6 (six) hours as needed for nausea or vomiting. 02/08/20   Ward, Delice Bison, DO  Pancrelipase, Lip-Prot-Amyl, (CREON) 24000-76000 units CPEP 1 tab PO TID with meals Patient taking differently: Take 1 capsule by mouth 3 (three) times daily. 1 tab PO TID with meals 06/07/19   Elsie Stain, MD  pantoprazole (PROTONIX) 40 MG tablet Take 1 tablet (40 mg total) by mouth daily. 11/06/19   Charlann Lange, PA-C  promethazine (PHENERGAN) 25 MG tablet TAKE 1 TABLET BY MOUTH EVERY 8 HOURS AS NEEDED FOR NAUSEA OR VOMITING. Patient taking differently: Take 25 mg by mouth every 8 (eight) hours as needed for nausea.  06/07/19   Elsie Stain, MD  sucralfate (CARAFATE) 1 g tablet Take 1 tablet (1 g total) by mouth 4 (four) times daily -  with meals and at bedtime. 11/02/19   Montine Circle, PA-C  traMADol (ULTRAM) 50 MG tablet Take 1 tablet (50 mg total) by mouth every 12 (twelve) hours as needed. Patient taking differently: Take 50 mg by mouth every 12  (twelve) hours as needed for moderate pain.  11/17/19   Argentina Donovan, PA-C    Allergies    Patient has no known allergies.  Review of Systems   Review of Systems  Gastrointestinal: Positive for abdominal pain.  All other systems reviewed and are negative.   Physical Exam Updated Vital Signs BP 118/86   Pulse 64   Temp 98.2 F (36.8 C) (Oral)   Resp 16   Ht 6\' 3"  (1.905 m)   SpO2 100%   BMI 20.67 kg/m   Physical Exam Vitals and nursing note reviewed.  Constitutional:      General: He is not in acute distress.  Appearance: Normal appearance. He is well-developed.  HENT:     Head: Normocephalic and atraumatic.     Right Ear: Hearing normal.     Left Ear: Hearing normal.     Nose: Nose normal.  Eyes:     Conjunctiva/sclera: Conjunctivae normal.     Pupils: Pupils are equal, round, and reactive to light.  Cardiovascular:     Rate and Rhythm: Regular rhythm.     Heart sounds: S1 normal and S2 normal. No murmur. No friction rub. No gallop.   Pulmonary:     Effort: Pulmonary effort is normal. No respiratory distress.     Breath sounds: Normal breath sounds.  Chest:     Chest wall: No tenderness.  Abdominal:     General: Bowel sounds are normal.     Palpations: Abdomen is soft.     Tenderness: There is abdominal tenderness in the left lower quadrant. There is no guarding or rebound. Negative signs include Murphy's sign and McBurney's sign.     Hernia: No hernia is present.  Musculoskeletal:        General: Normal range of motion.     Cervical back: Normal range of motion and neck supple.  Skin:    General: Skin is warm and dry.     Findings: No rash.  Neurological:     Mental Status: He is alert and oriented to person, place, and time.     GCS: GCS eye subscore is 4. GCS verbal subscore is 5. GCS motor subscore is 6.     Cranial Nerves: No cranial nerve deficit.     Sensory: No sensory deficit.     Coordination: Coordination normal.  Psychiatric:         Speech: Speech normal.        Behavior: Behavior normal.        Thought Content: Thought content normal.     ED Results / Procedures / Treatments   Labs (all labs ordered are listed, but only abnormal results are displayed) Labs Reviewed  CBC WITH DIFFERENTIAL/PLATELET - Abnormal; Notable for the following components:      Result Value   RBC 4.05 (*)    Hemoglobin 11.7 (*)    HCT 35.8 (*)    RDW 18.6 (*)    Abs Immature Granulocytes 0.11 (*)    All other components within normal limits  COMPREHENSIVE METABOLIC PANEL - Abnormal; Notable for the following components:   Sodium 133 (*)    Glucose, Bld 131 (*)    Calcium 8.4 (*)    Total Protein 6.1 (*)    Albumin 3.2 (*)    All other components within normal limits  LIPASE, BLOOD  URINALYSIS, ROUTINE W REFLEX MICROSCOPIC    EKG None  Radiology No results found.  Procedures Procedures (including critical care time)  Medications Ordered in ED Medications  sodium chloride 0.9 % bolus 500 mL (0 mLs Intravenous Stopped 03/01/20 0655)  ondansetron (ZOFRAN) injection 4 mg (4 mg Intravenous Given 03/01/20 0556)  morphine 4 MG/ML injection 4 mg (4 mg Intravenous Given 03/01/20 0557)  HYDROmorphone (DILAUDID) injection 1 mg (1 mg Intravenous Given 03/01/20 0707)    ED Course  I have reviewed the triage vital signs and the nursing notes.  Pertinent labs & imaging results that were available during my care of the patient were reviewed by me and considered in my medical decision making (see chart for details).    MDM Rules/Calculators/A&P  Patient presents to the emergency department for evaluation of left lower abdominal pain. Reviewing his records reveals multiple ER visits with similar presentations. Patient was seen on May 12 with similar symptoms. At that time he had a CT abdomen and chest that did not show any acute abnormalities. He has had CT of abdomen and pelvis multiple times in the ED for similar  symptoms. I do not feel he requires additional imaging. He has had history of chronic pancreatitis and Whipple procedure but there is no sign of bowel obstruction on exam. Lab work is reassuring, normal white blood cell count, normal lipase. Patient treated symptomatically for pain and will need to follow-up as an outpatient with primary care.  Final Clinical Impression(s) / ED Diagnoses Final diagnoses:  Left lower quadrant abdominal pain    Rx / DC Orders ED Discharge Orders    None       Orpah Greek, MD 03/01/20 [phone]

## 2020-03-01 NOTE — Discharge Instructions (Addendum)
Follow-up with your primary doctor regarding the symptoms you are experiencing today.  Return to ER if you have worsening abdominal pain, vomiting, fever or other new concerning symptom.

## 2020-03-01 NOTE — ED Notes (Signed)
[place] sandwich and sprite given to patient

## 2020-03-02 MED FILL — BUPROPION HCL SR 100 MG TAB: 100 | 30 days supply | Qty: 60 | Fill #1

## 2020-03-02 MED FILL — ?PANTOPRAZOLE SO DR 40MG TA: 40 | 30 days supply | Qty: 30 | Fill #1

## 2020-04-10 ENCOUNTER — Encounter (HOSPITAL_COMMUNITY): Payer: Self-pay

## 2020-04-10 ENCOUNTER — Emergency Department (HOSPITAL_COMMUNITY)
Admission: EM | Admit: 2020-04-10 | Discharge: 2020-04-10 | Disposition: A | Discharge status: Home / Self Care | Payer: Medicaid Other | Attending: Emergency Medicine | Admitting: Emergency Medicine

## 2020-04-10 DIAGNOSIS — R1013 Epigastric pain: Secondary | ICD-10-CM | POA: Diagnosis not present

## 2020-04-10 DIAGNOSIS — Z7982 Long term (current) use of aspirin: Secondary | ICD-10-CM | POA: Diagnosis not present

## 2020-04-10 DIAGNOSIS — F17210 Nicotine dependence, cigarettes, uncomplicated: Secondary | ICD-10-CM | POA: Diagnosis not present

## 2020-04-10 DIAGNOSIS — Z79899 Other long term (current) drug therapy: Secondary | ICD-10-CM | POA: Diagnosis not present

## 2020-04-10 DIAGNOSIS — I10 Essential (primary) hypertension: Secondary | ICD-10-CM | POA: Diagnosis not present

## 2020-04-10 DIAGNOSIS — K219 Gastro-esophageal reflux disease without esophagitis: Secondary | ICD-10-CM | POA: Insufficient documentation

## 2020-04-10 LAB — CBC
HCT: 36.2 % — ABNORMAL LOW (ref 39.0–52.0)
Hemoglobin: 11.5 g/dL — ABNORMAL LOW (ref 13.0–17.0)
MCH: 28.9 pg (ref 26.0–34.0)
MCHC: 31.8 g/dL (ref 30.0–36.0)
MCV: 91 fL (ref 80.0–100.0)
Platelets: 257 10*3/uL (ref 150–400)
RBC: 3.98 MIL/uL — ABNORMAL LOW (ref 4.22–5.81)
RDW: 17.5 % — ABNORMAL HIGH (ref 11.5–15.5)
WBC: 11.4 10*3/uL — ABNORMAL HIGH (ref 4.0–10.5)
nRBC: 0 % (ref 0.0–0.2)

## 2020-04-10 LAB — COMPREHENSIVE METABOLIC PANEL
ALT: 15 U/L (ref 0–44)
AST: 16 U/L (ref 15–41)
Albumin: 3.6 g/dL (ref 3.5–5.0)
Alkaline Phosphatase: 123 U/L (ref 38–126)
Anion gap: 8 (ref 5–15)
BUN: 14 mg/dL (ref 6–20)
CO2: 25 mmol/L (ref 22–32)
Calcium: 9 mg/dL (ref 8.9–10.3)
Chloride: 103 mmol/L (ref 98–111)
Creatinine, Ser: 0.66 mg/dL (ref 0.61–1.24)
GFR calc Af Amer: >60 mL/min (ref >60)
GFR calc non Af Amer: >60 mL/min (ref >60)
Glucose, Bld: 125 mg/dL — ABNORMAL HIGH (ref 70–99)
Potassium: 3.7 mmol/L (ref 3.5–5.1)
Sodium: 136 mmol/L (ref 135–145)
Total Bilirubin: 0.4 mg/dL (ref 0.3–1.2)
Total Protein: 7.3 g/dL (ref 6.5–8.1)

## 2020-04-10 LAB — LIPASE, BLOOD: Lipase: 16 U/L (ref 11–51)

## 2020-04-10 MED ORDER — HYDROMORPHONE HCL 1 MG/ML IJ SOLN
1.0000 mg | Freq: Once | INTRAMUSCULAR | Status: AC
  Administered 2020-04-10: 1 mg via INTRAVENOUS
  Filled 2020-04-10: qty 1

## 2020-04-10 MED ORDER — HYDROMORPHONE HCL 1 MG/ML IJ SOLN
INTRAMUSCULAR | Status: AC
  Administered 2020-04-10: 1 mg via INTRAVENOUS
  Filled 2020-04-10: qty 1

## 2020-04-10 MED ORDER — ONDANSETRON HCL 4 MG/2ML IJ SOLN
4.0000 mg | Freq: Once | INTRAMUSCULAR | Status: AC
  Administered 2020-04-10: 4 mg via INTRAVENOUS
  Filled 2020-04-10: qty 2

## 2020-04-10 MED ORDER — PANTOPRAZOLE SODIUM 40 MG PO TBEC
40.0000 mg | DELAYED_RELEASE_TABLET | Freq: Every day | ORAL | 0 refills | Status: DC
Start: 2020-04-10 — End: 2020-06-22

## 2020-04-10 MED ORDER — SODIUM CHLORIDE 0.9 % IV BOLUS
1000.0000 mL | Freq: Once | INTRAVENOUS | Status: AC
  Administered 2020-04-10: 1000 mL via INTRAVENOUS

## 2020-04-10 MED ORDER — SODIUM CHLORIDE 0.9% FLUSH: 3.0000 mL | Freq: Once | INTRAVENOUS | Status: DC

## 2020-04-10 NOTE — ED Provider Notes (Signed)
[facility] DEPT Provider Note   CSN: [id number] Arrival date & time: 04/10/20  1304     History Chief Complaint  Patient presents with  . Abdominal Pain    Brent Rogers is a 54 y.o. male.  Patient with hx pancreatitis, c/o epigastric and left upper abd pain. States has same pain on chronic, recurrent basis. Currently symptoms for past day, acute onset, moderate/severe, constant, persistent, non radiating, without specific exacerbating or alleviating factors. No fever or chills. Is having normal bms. +nausea/vomiting, yellowish, no bloody or bilious emesis. No dysuria or gu c/o. No chest pain or discomfort. No sob. No cough.   The history is provided by the patient.  Abdominal Pain Associated symptoms: nausea and vomiting   Associated symptoms: no chest pain, no constipation, no diarrhea, no dysuria, no fever, no shortness of breath and no sore throat        Past Medical History:  Diagnosis Date  . Benign tumor of endocrine pancreas   . Chronic pancreatitis ([place])    S/P Whipple  . Foot ulcer with fat layer exposed ([place])   . Hypertension   . Migraine    "@ least once/month" (11/12/2015)  . Pancreatic abnormality    CT  shows mass  . Pneumonia 11/12/2015  . Scoliosis     Patient Active Problem List   Diagnosis Date Noted  . 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 Chronic pain syndrome 06/07/2019  . Depression 06/07/2019  . Chronic pancreatitis ([place]) 05/01/2017  . Foot callus 12/25/2015  . Onychomycosis of toenail 12/25/2015  . Fatty liver 11/12/2015  . GERD (gastroesophageal reflux disease) 11/12/2015  . HTN (hypertension) 07/10/2015  . S/P cholecystectomy 07/10/2015  . Tobacco abuse 12/26/2014  . Protein-calorie malnutrition, severe ([place]) 11/20/2014  . Recurrent pancreatitis 06/06/2014  . Exocrine pancreatic insufficiency ([place]) 12/23/2013    Past Surgical History:  Procedure Laterality Date  . BACK SURGERY    . EUS  N/A 09/21/2013   Procedure: ESOPHAGEAL ENDOSCOPIC ULTRASOUND (EUS) RADIAL;  Surgeon: Beryle Beams, MD;  [facility];  Service: Endoscopy;  Laterality: N/A;  . EUS N/A 09/30/2013   Procedure: UPPER ENDOSCOPIC ULTRASOUND (EUS) LINEAR;  Surgeon: Beryle Beams, MD;  [facility];  Service: Endoscopy;  Laterality: N/A;  . FINE NEEDLE ASPIRATION N/A 09/21/2013   Procedure: FINE NEEDLE ASPIRATION (FNA) LINEAR;  Surgeon: Beryle Beams, MD;  [facility];  Service: Endoscopy;  Laterality: N/A;  . FINGER FRACTURE SURGERY Left 1995   5th digit  . FRACTURE SURGERY    . KNEE ARTHROSCOPY Bilateral 1987-1989   right-left  . LAPAROSCOPY N/A 12/01/2013   Procedure: LAPAROSCOPY DIAGNOSTIC PANCREATICODUODENECTOMY WITH BILIARY AND PANCREATIC STENTS;  Surgeon: Stark Klein, MD;  [facility];  Service: General;  Laterality: N/A;  . LUMBAR [place] SURGERY  1990's  . WHIPPLE PROCEDURE  12/01/2013       Family History  Problem Relation Age of Onset  . Cancer Mother        unsure  . COPD Mother   . Hypertension Mother   . Hypertension Father   . COPD Father   . CAD Sister        possible has stent per pt    Social History   Tobacco Use  . Smoking status: Current Every Day Smoker    Packs/day: 0.25    Years: 30.00    Pack years: 7.50    Types: Cigarettes  . Smokeless tobacco: Former Systems developer  Types: Snuff  . Tobacco comment: "used snuff 10-15 years in my 20s-30s"  Vaping Use  . Vaping Use: Never used  Substance Use Topics  . Alcohol use: Yes    Alcohol/week: 4.0 standard drinks    Types: 4 Cans of beer per week    Comment: 05/01/16  . Drug use: No    Home Medications Prior to Admission medications   Medication Sig Start Date End Date Taking? Authorizing Provider  acetaminophen (TYLENOL) 500 MG tablet Take 1,000 mg by mouth every 6 (six) hours as needed for mild pain.    [provider]  amLODipine (NORVASC) 10 MG tablet Take 1 tablet (10 mg total) by  mouth daily. 06/07/19   Elsie Stain, MD  aspirin 81 MG EC tablet Take 81 mg by mouth as needed for pain.    [provider]  Aspirin-Acetaminophen-Caffeine (GOODY HEADACHE PO) Take 1 packet by mouth as needed (pain, headache).    [provider]  bismuth subsalicylate (PEPTO BISMOL) 262 MG/15ML suspension Take 30 mLs by mouth every 6 (six) hours as needed for indigestion or diarrhea or loose stools.    [provider]  buPROPion (WELLBUTRIN SR) 100 MG 12 hr tablet Take 1 tablet (100 mg total) by mouth 2 (two) times daily. 11/17/19   Argentina Donovan, PA-C  dicyclomine (BENTYL) 20 MG tablet Take 1 po QID prn abdominal pain Patient taking differently: Take 20 mg by mouth 4 (four) times daily as needed (abdominal pain).  12/28/19   Rolland Porter, MD  dicyclomine (BENTYL) 20 MG tablet Take 1 tablet (20 mg total) by mouth every 8 (eight) hours as needed for spasms (Abdominal cramping). Patient not taking: Reported on 03/01/2020 02/08/20   Ward, Delice Bison, DO  guaiFENesin (MUCINEX) 600 MG 12 hr tablet Take 600 mg by mouth 2 (two) times daily as needed for cough.    [provider]  ibuprofen (ADVIL) 200 MG tablet Take 800-1,000 mg by mouth every 6 (six) hours as needed for moderate pain.    [provider]  Multiple Vitamin (MULTIVITAMIN WITH MINERALS) TABS tablet Take 1 tablet by mouth daily. Patient not taking: Reported on 03/01/2020 09/18/18   Kayleen Memos, DO  ondansetron (ZOFRAN ODT) 4 MG disintegrating tablet Take 1 tablet (4 mg total) by mouth every 8 (eight) hours as needed. Patient not taking: Reported on 03/01/2020 11/02/19   Montine Circle, PA-C  ondansetron (ZOFRAN ODT) 4 MG disintegrating tablet Take 1 tablet (4 mg total) by mouth every 6 (six) hours as needed for nausea or vomiting. Patient not taking: Reported on 03/01/2020 02/08/20   Ward, Delice Bison, DO  Pancrelipase, Lip-Prot-Amyl, (CREON) 24000-76000 units CPEP 1 tab PO TID with meals Patient taking  differently: Take 1 capsule by mouth with breakfast, with lunch, and with evening meal.  06/07/19   Elsie Stain, MD  pantoprazole (PROTONIX) 40 MG tablet Take 1 tablet (40 mg total) by mouth daily. 11/06/19   Charlann Lange, PA-C  promethazine (PHENERGAN) 25 MG tablet TAKE 1 TABLET BY MOUTH EVERY 8 HOURS AS NEEDED FOR NAUSEA OR VOMITING. Patient taking differently: Take 25 mg by mouth every 8 (eight) hours as needed for nausea or vomiting.  06/07/19   Elsie Stain, MD  sucralfate (CARAFATE) 1 g tablet Take 1 tablet (1 g total) by mouth 4 (four) times daily -  with meals and at bedtime. Patient not taking: Reported on 03/01/2020 11/02/19   Montine Circle, PA-C  traMADol Veatrice Bourbon) 50  MG tablet Take 1 tablet (50 mg total) by mouth every 12 (twelve) hours as needed. 11/17/19   Argentina Donovan, PA-C    Allergies    Patient has no known allergies.  Review of Systems   Review of Systems  Constitutional: Negative for fever.  HENT: Negative for sore throat.   Eyes: Negative for redness.  Respiratory: Negative for shortness of breath.   Cardiovascular: Negative for chest pain.  Gastrointestinal: Positive for abdominal pain, nausea and vomiting. Negative for constipation and diarrhea.  Genitourinary: Negative for dysuria and flank pain.  Musculoskeletal: Negative for back pain and neck pain.  Skin: Negative for rash.  Neurological: Negative for headaches.  Hematological: Does not bruise/bleed easily.  Psychiatric/Behavioral: Negative for confusion.    Physical Exam Updated Vital Signs BP (!) 116/92 (BP Location: Right Arm)   Pulse 86   Temp 98.8 F (37.1 C) (Oral)   Resp 16   SpO2 99%   Physical Exam Vitals and nursing note reviewed.  Constitutional:      Appearance: Normal appearance. He is well-developed.  HENT:     Head: Atraumatic.     Nose: Nose normal.     Mouth/Throat:     Mouth: Mucous membranes are moist.     Pharynx: Oropharynx is clear.  Eyes:     General: No  scleral icterus.    Conjunctiva/sclera: Conjunctivae normal.  Neck:     Trachea: No tracheal deviation.  Cardiovascular:     Rate and Rhythm: Normal rate and regular rhythm.     Pulses: Normal pulses.     Heart sounds: Normal heart sounds. No murmur heard.  No friction rub. No gallop.   Pulmonary:     Effort: Pulmonary effort is normal. No accessory muscle usage or respiratory distress.     Breath sounds: Normal breath sounds.  Abdominal:     General: Bowel sounds are normal. There is no distension.     Palpations: Abdomen is soft. There is no mass.     Tenderness: There is no abdominal tenderness. There is no guarding or rebound.     Hernia: No hernia is present.  Genitourinary:    Comments: No cva tenderness. Musculoskeletal:        General: No swelling.     Cervical back: Normal range of motion and neck supple. No rigidity.  Skin:    General: Skin is warm and dry.     Findings: No rash.  Neurological:     Mental Status: He is alert.     Comments: Alert, speech clear. Steady gait.   Psychiatric:        Mood and Affect: Mood normal.     ED Results / Procedures / Treatments   Labs (all labs ordered are listed, but only abnormal results are displayed) Results for orders placed or performed during the hospital encounter of 04/10/20  Lipase, blood  Result Value Ref Range   Lipase 16 11 - 51 U/L  Comprehensive metabolic panel  Result Value Ref Range   Sodium 136 135 - 145 mmol/L   Potassium 3.7 3.5 - 5.1 mmol/L   Chloride 103 98 - 111 mmol/L   CO2 25 22 - 32 mmol/L   Glucose, Bld 125 (H) 70 - 99 mg/dL   BUN 14 6 - 20 mg/dL   Creatinine, Ser 0.66 0.61 - 1.24 mg/dL   Calcium 9.0 8.9 - 10.3 mg/dL   Total Protein 7.3 6.5 - 8.1 g/dL   Albumin 3.6 3.5 - 5.0  g/dL   AST 16 15 - 41 U/L   ALT 15 0 - 44 U/L   Alkaline Phosphatase 123 38 - 126 U/L   Total Bilirubin 0.4 0.3 - 1.2 mg/dL   GFR calc non Af Amer >60 >60 mL/min   GFR calc Af Amer >60 >60 mL/min   Anion gap 8 5 -  15  CBC  Result Value Ref Range   WBC 11.4 (H) 4.0 - 10.5 K/uL   RBC 3.98 (L) 4.22 - 5.81 MIL/uL   Hemoglobin 11.5 (L) 13.0 - 17.0 g/dL   HCT 36.2 (L) 39 - 52 %   MCV 91.0 80.0 - 100.0 fL   MCH 28.9 26.0 - 34.0 pg   MCHC 31.8 30.0 - 36.0 g/dL   RDW 17.5 (H) 11.5 - 15.5 %   Platelets 257 150 - 400 K/uL   nRBC 0.0 0.0 - 0.2 %    EKG None  Radiology No results found.  Procedures Procedures (including critical care time)  Medications Ordered in ED Medications  sodium chloride flush (NS) 0.9 % injection 3 mL (has no administration in time range)  sodium chloride 0.9 % bolus 1,000 mL (has no administration in time range)  ondansetron (ZOFRAN) injection 4 mg (has no administration in time range)  HYDROmorphone (DILAUDID) injection 1 mg (has no administration in time range)    ED Course  I have reviewed the triage vital signs and the nursing notes.  Pertinent labs & imaging results that were available during my care of the patient were reviewed by me and considered in my medical decision making (see chart for details).    MDM Rules/Calculators/A&P                         Iv ns bolus. Pt requests pain med. Dilaudid iv. zofran iv.   Reviewed nursing notes and prior charts for additional history.   Labs reviewed/interpreted by me - lipase normal.  Recheck abd soft nt.   No emesis in ED.  Pt appears stable for d/c.   Return precautions provided.      Final Clinical Impression(s) / ED Diagnoses Final diagnoses:  None    Rx / DC Orders ED Discharge Orders    None       Lajean Saver, MD 04/10/20 2146

## 2020-04-10 NOTE — Discharge Instructions (Addendum)
It was our pleasure to provide your ER care today - we hope that you feel better.  Your lab work, including pancreas test looks good.   Take protonix (acid blocker medication).  You may also try pepcid or maalox for symptom relief.   Follow up with primary care doctor in the coming week.  Return to ER if worse, new symptoms, fevers, worsening or severe pain, persistent vomiting, or other concern.   You were given pain medication in the ER - no driving for the next 6 hours.

## 2020-04-10 NOTE — ED Triage Notes (Signed)
Pt BIBA from home. Pt has hx of pancreatitis. Pt has been vomiting since yesterday, pain in LUQ .    132/82 18RR 62HR 95%RA 97.9

## 2020-04-11 LAB — URINALYSIS, ROUTINE W REFLEX MICROSCOPIC
Bacteria, UA: NONE SEEN
Bilirubin Urine: NEGATIVE
Glucose, UA: NEGATIVE mg/dL
Hgb urine dipstick: NEGATIVE
Ketones, ur: 20 mg/dL — AB
Leukocytes,Ua: NEGATIVE
Nitrite: NEGATIVE
Protein, ur: 30 mg/dL — AB
Specific Gravity, Urine: 1.032 — ABNORMAL HIGH (ref 1.005–1.030)
pH: 5 (ref 5.0–8.0)

## 2020-05-15 MED FILL — AMLODIPINE BESYLATE 10 MG T: 10 | 30 days supply | Qty: 30 | Fill #3

## 2020-05-15 MED FILL — BUPROPION HCL SR 100 MG TAB: 100 | 30 days supply | Qty: 60 | Fill #2

## 2020-05-15 MED FILL — PANTOPRAZOLE SOD DR 40 MG T: 40 | 30 days supply | Qty: 30 | Fill #2

## 2020-05-29 ENCOUNTER — Encounter (HOSPITAL_COMMUNITY): Payer: Self-pay | Admitting: *Deleted

## 2020-05-29 ENCOUNTER — Emergency Department (HOSPITAL_COMMUNITY)
Admission: EM | Admit: 2020-05-29 | Discharge: 2020-05-29 | Disposition: A | Discharge status: Home / Self Care | Payer: Medicaid Other | Attending: Emergency Medicine | Admitting: Emergency Medicine

## 2020-05-29 ENCOUNTER — Other Ambulatory Visit: Payer: Self-pay

## 2020-05-29 ENCOUNTER — Emergency Department (HOSPITAL_COMMUNITY): Payer: Medicaid Other

## 2020-05-29 DIAGNOSIS — Z79899 Other long term (current) drug therapy: Secondary | ICD-10-CM | POA: Diagnosis not present

## 2020-05-29 DIAGNOSIS — F17210 Nicotine dependence, cigarettes, uncomplicated: Secondary | ICD-10-CM | POA: Insufficient documentation

## 2020-05-29 DIAGNOSIS — I10 Essential (primary) hypertension: Secondary | ICD-10-CM | POA: Diagnosis not present

## 2020-05-29 DIAGNOSIS — R1013 Epigastric pain: Secondary | ICD-10-CM | POA: Diagnosis not present

## 2020-05-29 DIAGNOSIS — Z7982 Long term (current) use of aspirin: Secondary | ICD-10-CM | POA: Diagnosis not present

## 2020-05-29 DIAGNOSIS — R079 Chest pain, unspecified: Secondary | ICD-10-CM | POA: Diagnosis present

## 2020-05-29 LAB — CBC
HCT: 33.3 % — ABNORMAL LOW (ref 39.0–52.0)
Hemoglobin: 10.2 g/dL — ABNORMAL LOW (ref 13.0–17.0)
MCH: 27.5 pg (ref 26.0–34.0)
MCHC: 30.6 g/dL (ref 30.0–36.0)
MCV: 89.8 fL (ref 80.0–100.0)
Platelets: 347 10*3/uL (ref 150–400)
RBC: 3.71 MIL/uL — ABNORMAL LOW (ref 4.22–5.81)
RDW: 19.5 % — ABNORMAL HIGH (ref 11.5–15.5)
WBC: 14 10*3/uL — ABNORMAL HIGH (ref 4.0–10.5)
nRBC: 0 % (ref 0.0–0.2)

## 2020-05-29 LAB — TROPONIN I (HIGH SENSITIVITY)
Troponin I (High Sensitivity): 4 ng/L (ref <18)
Troponin I (High Sensitivity): 4 ng/L (ref <18)

## 2020-05-29 LAB — BASIC METABOLIC PANEL
Anion gap: 11 (ref 5–15)
BUN: 9 mg/dL (ref 6–20)
CO2: 19 mmol/L — ABNORMAL LOW (ref 22–32)
Calcium: 7.7 mg/dL — ABNORMAL LOW (ref 8.9–10.3)
Chloride: 105 mmol/L (ref 98–111)
Creatinine, Ser: 0.8 mg/dL (ref 0.61–1.24)
GFR calc Af Amer: >60 mL/min (ref >60)
GFR calc non Af Amer: >60 mL/min (ref >60)
Glucose, Bld: 92 mg/dL (ref 70–99)
Potassium: 3.2 mmol/L — ABNORMAL LOW (ref 3.5–5.1)
Sodium: 135 mmol/L (ref 135–145)

## 2020-05-29 LAB — LIPASE, BLOOD: Lipase: 15 U/L (ref 11–51)

## 2020-05-29 LAB — HEPATIC FUNCTION PANEL
ALT: 11 U/L (ref 0–44)
AST: 18 U/L (ref 15–41)
Albumin: 2.3 g/dL — ABNORMAL LOW (ref 3.5–5.0)
Alkaline Phosphatase: 66 U/L (ref 38–126)
Bilirubin, Direct: <0.1 mg/dL (ref 0.0–0.2)
Total Bilirubin: 0.3 mg/dL (ref 0.3–1.2)
Total Protein: 4.7 g/dL — ABNORMAL LOW (ref 6.5–8.1)

## 2020-05-29 MED ORDER — LACTATED RINGERS IV BOLUS
1000.0000 mL | Freq: Once | INTRAVENOUS | Status: AC
  Administered 2020-05-29: 1000 mL via INTRAVENOUS

## 2020-05-29 MED ORDER — HYDROMORPHONE HCL 1 MG/ML IJ SOLN
1.0000 mg | Freq: Once | INTRAMUSCULAR | Status: AC
  Administered 2020-05-29: 1 mg via INTRAVENOUS
  Filled 2020-05-29: qty 1

## 2020-05-29 MED ORDER — PANTOPRAZOLE SODIUM 40 MG PO TBEC
40.0000 mg | DELAYED_RELEASE_TABLET | Freq: Once | ORAL | Status: AC
  Administered 2020-05-29: 40 mg via ORAL
  Filled 2020-05-29: qty 1

## 2020-05-29 MED ORDER — ALUM & MAG HYDROXIDE-SIMETH 200-200-20 MG/5ML PO SUSP
30.0000 mL | Freq: Once | ORAL | Status: AC
  Administered 2020-05-29: 30 mL via ORAL
  Filled 2020-05-29: qty 30

## 2020-05-29 MED ORDER — PROMETHAZINE HCL 25 MG/ML IJ SOLN
25.0000 mg | Freq: Once | INTRAMUSCULAR | Status: AC
  Administered 2020-05-29: 25 mg via INTRAVENOUS
  Filled 2020-05-29: qty 1

## 2020-05-29 NOTE — ED Provider Notes (Signed)
[facility] Provider Note   CSN: [id number] Arrival date & time: 05/29/20  0151     History Chief Complaint  Patient presents with  . Chest Pain    Brent Rogers is a 54 y.o. male.  Vomiting and decreased bowel movements for last fe days as well. Last urine was earlier in the morning but was asymptomatic.   Abdominal Pain Pain location:  Epigastric Pain quality: sharp   Pain radiates to:  Does not radiate Pain severity:  Mild Duration:  2 days Timing:  Constant Progression:  Waxing and waning Chronicity:  Recurrent Context: not alcohol use   Relieved by:  None tried Worsened by:  Nothing Ineffective treatments:  None tried Associated symptoms: no anorexia, no fatigue and no fever        Past Medical History:  Diagnosis Date  . Benign tumor of endocrine pancreas   . Chronic pancreatitis ([place])    S/P [place]  . Foot ulcer with fat layer exposed ([place])   . Hypertension   . Migraine    "@ least once/month" (11/12/2015)  . Pancreatic abnormality    CT  shows mass  . Pneumonia 11/12/2015  . Scoliosis     Patient Active Problem List   Diagnosis Date Noted  . 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 Chronic pain syndrome 06/07/2019  . Depression 06/07/2019  . Chronic pancreatitis ([place]) 05/01/2017  . Foot callus 12/25/2015  . Onychomycosis of toenail 12/25/2015  . Fatty liver 11/12/2015  . GERD (gastroesophageal reflux disease) 11/12/2015  . HTN (hypertension) 07/10/2015  . S/P cholecystectomy 07/10/2015  . Tobacco abuse 12/26/2014  . Protein-calorie malnutrition, severe ([place]) 11/20/2014  . Recurrent pancreatitis 06/06/2014  . Exocrine pancreatic insufficiency ([place]) 12/23/2013    Past Surgical History:  Procedure Laterality Date  . BACK SURGERY    . EUS N/A 09/21/2013   Procedure: ESOPHAGEAL ENDOSCOPIC ULTRASOUND (EUS) RADIAL;  Surgeon: Beryle Beams, MD;  Location: [facility];  Service: Endoscopy;   Laterality: N/A;  . EUS N/A 09/30/2013   Procedure: UPPER ENDOSCOPIC ULTRASOUND (EUS) LINEAR;  Surgeon: Beryle Beams, MD;  Location: [facility];  Service: Endoscopy;  Laterality: N/A;  . FINE NEEDLE ASPIRATION N/A 09/21/2013   Procedure: FINE NEEDLE ASPIRATION (FNA) LINEAR;  Surgeon: Beryle Beams, MD;  Location: [facility];  Service: Endoscopy;  Laterality: N/A;  . FINGER FRACTURE SURGERY Left 1995   5th digit  . FRACTURE SURGERY    . KNEE ARTHROSCOPY Bilateral 1987-1989   right-left  . LAPAROSCOPY N/A 12/01/2013   Procedure: LAPAROSCOPY DIAGNOSTIC PANCREATICODUODENECTOMY WITH BILIARY AND PANCREATIC STENTS;  Surgeon: Stark Klein, MD;  Location: [facility] ORS;  Service: General;  Laterality: N/A;  . LUMBAR [place] SURGERY  1990's  . WHIPPLE PROCEDURE  12/01/2013       Family History  Problem Relation Age of Onset  . Cancer Mother        unsure  . COPD Mother   . Hypertension Mother   . Hypertension Father   . COPD Father   . CAD Sister        possible has stent per pt    Social History   Tobacco Use  . Smoking status: Current Every Day Smoker    Packs/day: 0.25    Years: 30.00    Pack years: 7.50    Types: Cigarettes  . Smokeless tobacco: Former Systems developer    Types: Snuff  . Tobacco comment: "used snuff 10-15 years in my 20s-30s"  Vaping Use  . Vaping Use: Never used  Substance Use Topics  . Alcohol use: Yes    Alcohol/week: 4.0 standard drinks    Types: 4 Cans of beer per week    Comment: 05/01/16  . Drug use: No    Home Medications Prior to Admission medications   Medication Sig Start Date End Date Taking? Authorizing Provider  acetaminophen (TYLENOL) 500 MG tablet Take 1,000 mg by mouth every 6 (six) hours as needed for mild pain.   Yes [provider]  amLODipine (NORVASC) 10 MG tablet Take 1 tablet (10 mg total) by mouth daily. 06/07/19  Yes Elsie Stain, MD  aspirin 81 MG EC tablet Take 81 mg by mouth as needed for pain.   Yes [provider]    Aspirin-Acetaminophen-Caffeine (GOODY HEADACHE PO) Take 1 packet by mouth as needed (pain, headache).   Yes [provider]  bismuth subsalicylate (PEPTO BISMOL) 262 MG/15ML suspension Take 30 mLs by mouth every 6 (six) hours as needed for indigestion or diarrhea or loose stools.   Yes [provider]  buPROPion (WELLBUTRIN SR) 100 MG 12 hr tablet Take 1 tablet (100 mg total) by mouth 2 (two) times daily. 11/17/19  Yes McClung, Angela M, PA-C  dicyclomine (BENTYL) 20 MG tablet Take 1 po QID prn abdominal pain Patient taking differently: Take 20 mg by mouth 4 (four) times daily as needed (abdominal pain).  12/28/19  Yes Rolland Porter, MD  ibuprofen (ADVIL) 200 MG tablet Take 800-1,000 mg by mouth every 6 (six) hours as needed for moderate pain.   Yes [provider]  Pancrelipase, Lip-Prot-Amyl, (CREON) 24000-76000 units CPEP 1 tab PO TID with meals Patient taking differently: Take 1 capsule by mouth with breakfast, with lunch, and with evening meal.  06/07/19  Yes Elsie Stain, MD  pantoprazole (PROTONIX) 40 MG tablet Take 1 tablet (40 mg total) by mouth daily. 04/10/20  Yes Lajean Saver, MD  promethazine (PHENERGAN) 25 MG tablet TAKE 1 TABLET BY MOUTH EVERY 8 HOURS AS NEEDED FOR NAUSEA OR VOMITING. Patient taking differently: Take 25 mg by mouth every 8 (eight) hours as needed for nausea or vomiting.  06/07/19  Yes Elsie Stain, MD  traMADol (ULTRAM) 50 MG tablet Take 1 tablet (50 mg total) by mouth every 12 (twelve) hours as needed. 11/17/19  Yes Freeman Caldron M, PA-C  sucralfate (CARAFATE) 1 g tablet Take 1 tablet (1 g total) by mouth 4 (four) times daily -  with meals and at bedtime. Patient not taking: Reported on 03/01/2020 11/02/19 05/29/20  Montine Circle, PA-C    Allergies    Patient has no known allergies.  Review of Systems   Review of Systems  Constitutional: Negative for fatigue and fever.  Gastrointestinal: Positive for abdominal pain. Negative for  anorexia.  All other systems reviewed and are negative.   Physical Exam Updated Vital Signs BP 101/83   Pulse 68   Temp 98.5 F (36.9 C) (Oral)   Resp 10   SpO2 97%   Physical Exam Vitals and nursing note reviewed.  Constitutional:      Appearance: He is well-developed.  HENT:     Head: Normocephalic and atraumatic.  Cardiovascular:     Rate and Rhythm: Normal rate.     Heart sounds: Normal heart sounds.  Pulmonary:     Effort: Pulmonary effort is normal. No respiratory distress.     Breath sounds: Normal breath sounds.  Chest:     Chest  wall: No mass, deformity, tenderness or crepitus.  Abdominal:     General: There is no distension.  Musculoskeletal:        General: Normal range of motion.     Cervical back: Normal range of motion.     Right lower leg: No edema.     Left lower leg: No edema.  Skin:    General: Skin is warm and dry.  Neurological:     Mental Status: He is alert.     ED Results / Procedures / Treatments   Labs (all labs ordered are listed, but only abnormal results are displayed) Labs Reviewed  BASIC METABOLIC PANEL - Abnormal; Notable for the following components:      Result Value   Potassium 3.2 (*)    CO2 19 (*)    Calcium 7.7 (*)    All other components within normal limits  CBC - Abnormal; Notable for the following components:   WBC 14.0 (*)    RBC 3.71 (*)    Hemoglobin 10.2 (*)    HCT 33.3 (*)    RDW 19.5 (*)    All other components within normal limits  HEPATIC FUNCTION PANEL - Abnormal; Notable for the following components:   Total Protein 4.7 (*)    Albumin 2.3 (*)    All other components within normal limits  LIPASE, BLOOD  TROPONIN I (HIGH SENSITIVITY)  TROPONIN I (HIGH SENSITIVITY)    EKG None  Radiology DG Chest 2 View  Result Date: 05/29/2020 CLINICAL DATA:  Chest pain, short of breath, diaphoresis EXAM: CHEST - 2 VIEW COMPARISON:  01/29/2020 FINDINGS: Frontal and lateral views of the chest demonstrate an  unremarkable cardiac silhouette. No airspace disease, effusion, or pneumothorax. No acute bony abnormalities. IMPRESSION: 1. No acute intrathoracic process. Electronically Signed   By: Randa Ngo M.D.   On: 05/29/2020 02:22    Procedures Procedures (including critical care time)  Medications Ordered in ED Medications  HYDROmorphone (DILAUDID) injection 1 mg (1 mg Intravenous Given 05/29/20 0328)  lactated ringers bolus 1,000 mL (0 mLs Intravenous Stopped 05/29/20 0520)  promethazine (PHENERGAN) injection 25 mg (25 mg Intravenous Given 05/29/20 0325)  alum & mag hydroxide-simeth (MAALOX/MYLANTA) 200-200-20 MG/5ML suspension 30 mL (30 mLs Oral Given 05/29/20 0330)  pantoprazole (PROTONIX) EC tablet 40 mg (40 mg Oral Given 05/29/20 0330)    ED Course  I have reviewed the triage vital signs and the nursing notes.  Pertinent labs & imaging results that were available during my care of the patient were reviewed by me and considered in my medical decision making (see chart for details).    MDM Rules/Calculators/A&P                         gatritis vs pancreatitis, less likely ACS or PE. Low susp for bowel obstruction, does have likely mild dehydration.  Patient noted to be sleeping multiple times on reeval. Not having vomiting. VSS. No indication for imaging, further workup in ER or hospital nor further symptomatic control. Stable for discharge.   Final Clinical Impression(s) / ED Diagnoses Final diagnoses:  Nonspecific chest pain    Rx / DC Orders ED Discharge Orders    None       Zein Helbing, Corene Cornea, MD 05/29/20 [phone]

## 2020-05-29 NOTE — ED Triage Notes (Signed)
Pt arrives via ems with complaints of a vice like chest pain when he lays down, gets sob, and sweaty with episodes.  Pt reports he has pancreatitis and has been having a flare up that causes him to have issues holding down fluids over the last week.  EMS gave 324mg  BASA, 1 nitro, and some fluid.  BP 96/70, and did not see EKG changes

## 2020-06-22 ENCOUNTER — Other Ambulatory Visit: Payer: Self-pay | Admitting: Family Medicine

## 2020-06-22 ENCOUNTER — Other Ambulatory Visit: Payer: Self-pay

## 2020-06-22 ENCOUNTER — Encounter: Payer: Self-pay | Admitting: Family Medicine

## 2020-06-22 ENCOUNTER — Ambulatory Visit: Payer: Medicaid Other | Attending: Family Medicine | Admitting: Family Medicine

## 2020-06-22 VITALS — BP 118/87 | HR 98 | Temp 97.3°F | Resp 18 | Ht 75.0 in | Wt 135.0 lb

## 2020-06-22 DIAGNOSIS — Z23 Encounter for immunization: Secondary | ICD-10-CM

## 2020-06-22 DIAGNOSIS — F32A Depression, unspecified: Secondary | ICD-10-CM

## 2020-06-22 DIAGNOSIS — E43 Unspecified severe protein-calorie malnutrition: Secondary | ICD-10-CM

## 2020-06-22 DIAGNOSIS — D509 Iron deficiency anemia, unspecified: Secondary | ICD-10-CM

## 2020-06-22 DIAGNOSIS — Z90411 Acquired partial absence of pancreas: Secondary | ICD-10-CM

## 2020-06-22 DIAGNOSIS — I10 Essential (primary) hypertension: Secondary | ICD-10-CM

## 2020-06-22 DIAGNOSIS — K861 Other chronic pancreatitis: Secondary | ICD-10-CM

## 2020-06-22 DIAGNOSIS — Z09 Encounter for follow-up examination after completed treatment for conditions other than malignant neoplasm: Secondary | ICD-10-CM

## 2020-06-22 DIAGNOSIS — K219 Gastro-esophageal reflux disease without esophagitis: Secondary | ICD-10-CM

## 2020-06-22 DIAGNOSIS — E876 Hypokalemia: Secondary | ICD-10-CM

## 2020-06-22 DIAGNOSIS — F329 Major depressive disorder, single episode, unspecified: Secondary | ICD-10-CM

## 2020-06-22 MED ORDER — PANTOPRAZOLE SODIUM 40 MG PO TBEC: 40.0000 mg | DELAYED_RELEASE_TABLET | Freq: Every day | ORAL | 0 refills | Status: DC

## 2020-06-22 MED ORDER — BUPROPION HCL ER (SR) 100 MG PO TB12: 100.0000 mg | ORAL_TABLET | Freq: Two times a day (BID) | ORAL | 1 refills | Status: DC

## 2020-06-22 MED ORDER — PROMETHAZINE HCL 25 MG PO TABS: ORAL_TABLET | ORAL | 3 refills | Status: DC

## 2020-06-22 MED ORDER — DICYCLOMINE HCL 20 MG PO TABS: ORAL_TABLET | ORAL | 5 refills | Status: DC

## 2020-06-22 MED ORDER — CREON 24000-76000 UNITS PO CPEP: 1.0000 | ORAL_CAPSULE | Freq: Three times a day (TID) | ORAL | 3 refills | Status: DC

## 2020-06-22 MED ORDER — AMLODIPINE BESYLATE 5 MG PO TABS: 5.0000 mg | ORAL_TABLET | Freq: Every day | ORAL | 3 refills | Status: DC

## 2020-06-22 MED ORDER — TRAMADOL HCL 50 MG PO TABS: 50.0000 mg | ORAL_TABLET | Freq: Two times a day (BID) | ORAL | 2 refills | Status: DC | PRN

## 2020-06-22 MED FILL — PANTOPRAZOLE SOD DR 40 MG T: 40 | 30 days supply | Qty: 30 | Fill #0

## 2020-06-22 MED FILL — BUPROPION HCL SR 100 MG TAB: 100 | 30 days supply | Qty: 60 | Fill #0

## 2020-06-22 MED FILL — traMADol HCL 50 MG TABS: 50 | 5 days supply | Qty: 10 | Fill #0

## 2020-06-22 MED FILL — CREON DR 24,000 UNITS CAP: 24000-76000 | 33 days supply | Qty: 100 | Fill #0

## 2020-06-22 MED FILL — AMLODIPINE BESYLATE 5 MG TA: 5 | 30 days supply | Qty: 30 | Fill #0

## 2020-06-22 MED FILL — PROMETHAZINE HCL 25 MG TABS: 25 | 20 days supply | Qty: 60 | Fill #0

## 2020-06-22 MED FILL — DICYCLOMINE 20 MG TABLET: 20 | 7 days supply | Qty: 30 | Fill #0

## 2020-06-22 NOTE — Progress Notes (Signed)
Established Patient Office Visit  Subjective:  Patient ID: Brent Rogers, male    DOB: 07-21-1966  Age: 54 y.o. MRN: [id number]  CC:  Chief Complaint  Patient presents with  . Medication Refill    HPI Brent Rogers, 54 year old male who is a patient of Dr. Wynetta Emery, who is status post emergency department visit on 05/29/2020 due to complaint of vomiting and decreased bowel movements for a few days prior to emergency department visit.  He also had complaint of sharp epigastric pain x2 days and nonspecific chest pain.  He has a medical history significant for chronic pancreatitis for which he is status post Whipple procedure, hypertension, benign pancreas tumor, migraine headaches and scoliosis.  He also has had prior back surgery and has issues with chronic pain.         At today's visit, he reports that he has been out of most of his medications for more than a month.  He reports several ED visits over the past few months secondary to recurrent abdominal pain along with nausea vomiting and dehydration.  He states that he has some good days in which he is able to eat 3 meals a day without difficulty but other days in which if he even drinks water, he will have onset of vomiting and/or diarrhea.  He does continue to take his pancreatic enzymes.  He has tried nutritional supplements to help with weight gain however he does not really like the taste of Ensure or Boost.  He denies any current issues with headaches or dizziness but when he is having recurrent nausea and vomiting he sometimes feels lightheaded.  He has not taken his blood pressure medication in about a month but states that in the past, he would have episodes in which his blood pressure would be very elevated and he was initially on amlodipine 5 mg with this was increased to 10 mg.  He denies any chest pain or palpitations.  No current issues with shortness of breath or cough.  He currently has some mild mid upper abdominal discomfort  which he believes is secondary to being out of acid reflux medication.  He still has some of the Creon which she has been taking but needs a refill of this medication as well.  He would also like a refill of tramadol to take as needed for more severe abdominal pain or back pain.  He denies any urinary frequency, urgency or dysuria.  He has occasional episodes of loose stools/diarrhea.  He has seen no blood in the stool or black stools.  He occasionally notices orange looking flecks within the diarrhea.  He denies any current or recent fever or chills.  Past Medical History:  Diagnosis Date  . Benign tumor of endocrine pancreas   . Chronic pancreatitis ([place])    S/P Whipple  . Foot ulcer with fat layer exposed ([place])   . Hypertension   . Migraine    "@ least once/month" (11/12/2015)  . Pancreatic abnormality    CT  shows mass  . Pneumonia 11/12/2015  . Scoliosis     Past Surgical History:  Procedure Laterality Date  . BACK SURGERY    . EUS N/A 09/21/2013   Procedure: ESOPHAGEAL ENDOSCOPIC ULTRASOUND (EUS) RADIAL;  Surgeon: Beryle Beams, MD;  Location: [facility];  Service: Endoscopy;  Laterality: N/A;  . EUS N/A 09/30/2013   Procedure: UPPER ENDOSCOPIC ULTRASOUND (EUS) LINEAR;  Surgeon: Beryle Beams, MD;  Location: [facility];  Service: Endoscopy;  Laterality: N/A;  . FINE NEEDLE ASPIRATION N/A 09/21/2013   Procedure: FINE NEEDLE ASPIRATION (FNA) LINEAR;  Surgeon: Beryle Beams, MD;  Location: [facility];  Service: Endoscopy;  Laterality: N/A;  . FINGER FRACTURE SURGERY Left 1995   5th digit  . FRACTURE SURGERY    . KNEE ARTHROSCOPY Bilateral 1987-1989   right-left  . LAPAROSCOPY N/A 12/01/2013   Procedure: LAPAROSCOPY DIAGNOSTIC PANCREATICODUODENECTOMY WITH BILIARY AND PANCREATIC STENTS;  Surgeon: Stark Klein, MD;  Location: [facility];  Service: General;  Laterality: N/A;  . LUMBAR [place] SURGERY  1990's  . WHIPPLE PROCEDURE  12/01/2013    Family History  Problem Relation Age of  Onset  . Cancer Mother        unsure  . COPD Mother   . Hypertension Mother   . Hypertension Father   . COPD Father   . CAD Sister        possible has stent per pt    Social History   Socioeconomic History  . Marital status: Divorced    Spouse name: Not on file  . Number of children: Not on file  . Years of education: Not on file  . Highest education level: Not on file  Occupational History  . Not on file  Tobacco Use  . Smoking status: Current Every Day Smoker    Packs/day: 0.25    Years: 30.00    Pack years: 7.50    Types: Cigarettes  . Smokeless tobacco: Former Systems developer    Types: Snuff  . Tobacco comment: "used snuff 10-15 years in my 20s-30s"  Vaping Use  . Vaping Use: Never used  Substance and Sexual Activity  . Alcohol use: Yes    Alcohol/week: 4.0 standard drinks    Types: 4 Cans of beer per week    Comment: 05/01/16  . Drug use: No  . Sexual activity: Not Currently    Partners: Male  Other Topics Concern  . Not on file  Social History Narrative  . Not on file   Social Determinants of Health   Financial Resource Strain:   . Difficulty of Paying Living Expenses: Not on file  Food Insecurity:   . Worried About Charity fundraiser in the Last Year: Not on file  . Ran Out of Food in the Last Year: Not on file  Transportation Needs:   . Lack of Transportation (Medical): Not on file  . Lack of Transportation (Non-Medical): Not on file  Physical Activity:   . Days of Exercise per Week: Not on file  . Minutes of Exercise per Session: Not on file  Stress:   . Feeling of Stress : Not on file  Social Connections:   . Frequency of Communication with Friends and Family: Not on file  . Frequency of Social Gatherings with Friends and Family: Not on file  . Attends Religious Services: Not on file  . Active Member of Clubs or Organizations: Not on file  . Attends Archivist Meetings: Not on file  . Marital Status: Not on file  Intimate Partner Violence:    . Fear of Current or Ex-Partner: Not on file  . Emotionally Abused: Not on file  . Physically Abused: Not on file  . Sexually Abused: Not on file    Outpatient Medications Prior to Visit  Medication Sig Dispense Refill  . acetaminophen (TYLENOL) 500 MG tablet Take 1,000 mg by mouth every 6 (six) hours as needed for mild pain.    Marland Kitchen amLODipine (NORVASC)  10 MG tablet Take 1 tablet (10 mg total) by mouth daily. 30 tablet 4  . aspirin 81 MG EC tablet Take 81 mg by mouth as needed for pain.    . Aspirin-Acetaminophen-Caffeine (GOODY HEADACHE PO) Take 1 packet by mouth as needed (pain, headache).    . bismuth subsalicylate (PEPTO BISMOL) 262 MG/15ML suspension Take 30 mLs by mouth every 6 (six) hours as needed for indigestion or diarrhea or loose stools.    Marland Kitchen buPROPion (WELLBUTRIN SR) 100 MG 12 hr tablet Take 1 tablet (100 mg total) by mouth 2 (two) times daily. 60 tablet 4  . dicyclomine (BENTYL) 20 MG tablet Take 1 po QID prn abdominal pain (Patient taking differently: Take 20 mg by mouth 4 (four) times daily as needed (abdominal pain). ) 30 tablet 0  . ibuprofen (ADVIL) 200 MG tablet Take 800-1,000 mg by mouth every 6 (six) hours as needed for moderate pain.    . Pancrelipase, Lip-Prot-Amyl, (CREON) 24000-76000 units CPEP 1 tab PO TID with meals (Patient taking differently: Take 1 capsule by mouth with breakfast, with lunch, and with evening meal. ) 180 capsule 3  . pantoprazole (PROTONIX) 40 MG tablet Take 1 tablet (40 mg total) by mouth daily. 30 tablet 0  . promethazine (PHENERGAN) 25 MG tablet TAKE 1 TABLET BY MOUTH EVERY 8 HOURS AS NEEDED FOR NAUSEA OR VOMITING. (Patient taking differently: Take 25 mg by mouth every 8 (eight) hours as needed for nausea or vomiting. ) 60 tablet 1  . traMADol (ULTRAM) 50 MG tablet Take 1 tablet (50 mg total) by mouth every 12 (twelve) hours as needed. 30 tablet 0   No facility-administered medications prior to visit.    No Known Allergies  ROS Review of  Systems  Constitutional: Positive for fatigue. Negative for chills and fever.  HENT: Negative for sore throat and trouble swallowing.   Eyes: Negative for photophobia and visual disturbance.  Respiratory: Negative for cough and shortness of breath.   Cardiovascular: Negative for chest pain and palpitations.  Gastrointestinal: Positive for abdominal pain, diarrhea and nausea. Negative for blood in stool and constipation.  Endocrine: Negative for polydipsia, polyphagia and polyuria.  Genitourinary: Negative for dysuria and frequency.  Musculoskeletal: Positive for arthralgias and back pain.  Neurological: Negative for dizziness and headaches.  Hematological: Negative for adenopathy. Does not bruise/bleed easily.      Objective:    Physical Exam Vitals and nursing note reviewed.  Constitutional:      General: He is not in acute distress.    Comments: Tall, thin older male in no acute distress  Cardiovascular:     Rate and Rhythm: Normal rate and regular rhythm.     Comments: Heart sounds are pronounced/pounding but no tachycardia Pulmonary:     Effort: Pulmonary effort is normal.     Breath sounds: Normal breath sounds.  Abdominal:     General: There is distension (Mild).     Palpations: Abdomen is soft.     Tenderness: There is no abdominal tenderness. There is no right CVA tenderness, left CVA tenderness, guarding or rebound.  Musculoskeletal:     Cervical back: Normal range of motion and neck supple.     Right lower leg: No edema.     Left lower leg: No edema.  Lymphadenopathy:     Cervical: No cervical adenopathy.  Skin:    General: Skin is warm and dry.  Neurological:     General: No focal deficit present.     Mental  Status: He is alert and oriented to person, place, and time.  Psychiatric:        Mood and Affect: Mood normal.        Behavior: Behavior normal.     BP 118/87 (BP Location: Left Arm, Patient Position: Sitting, Cuff Size: Normal)   Pulse 98   Temp (!)  97.3 F (36.3 C) (Temporal)   Resp 18   Ht 6\' 3"  (1.905 m)   Wt 135 lb (61.2 kg)   SpO2 100%   BMI 16.87 kg/m  Wt Readings from Last 3 Encounters:  01/24/20 165 lb 5.5 oz (75 kg)  01/13/20 165 lb 5.5 oz (75 kg)  12/27/19 165 lb 5.5 oz (75 kg)     Health Maintenance Due  Topic Date Due  . COVID-19 Vaccine (1) Never done  . COLONOSCOPY  Never done  . INFLUENZA VACCINE  04/29/2020      Lab Results  Component Value Date   TSH 1.690 05/01/2017   Lab Results  Component Value Date   WBC 14.0 (H) 05/29/2020   HGB 10.2 (L) 05/29/2020   HCT 33.3 (L) 05/29/2020   MCV 89.8 05/29/2020   PLT 347 05/29/2020   Lab Results  Component Value Date   NA 135 05/29/2020   K 3.2 (L) 05/29/2020   CO2 19 (L) 05/29/2020   GLUCOSE 92 05/29/2020   BUN 9 05/29/2020   CREATININE 0.80 05/29/2020   BILITOT 0.3 05/29/2020   ALKPHOS 66 05/29/2020   AST 18 05/29/2020   ALT 11 05/29/2020   PROT 4.7 (L) 05/29/2020   ALBUMIN 2.3 (L) 05/29/2020   CALCIUM 7.7 (L) 05/29/2020   ANIONGAP 11 05/29/2020   Lab Results  Component Value Date   CHOL 121 09/30/2014   Lab Results  Component Value Date   HDL 73 09/30/2014   Lab Results  Component Value Date   LDLCALC 35 09/30/2014   Lab Results  Component Value Date   TRIG 64 09/30/2014   Lab Results  Component Value Date   CHOLHDL 1.7 09/30/2014   Lab Results  Component Value Date   HGBA1C 5.2 09/16/2018      Assessment & Plan:  1. Essential hypertension Patient has been on amlodipine 10 mg however he states that he has been out of the medication for this past month and blood pressure today is actually normal.  He reports that blood pressure in the past has been elevated at times and that is why he was increased from 5 to 10 mg.  As patient has normal blood pressure today and has had issues with dehydration/hypotension, will decrease dose of amlodipine from 10 mg to 5 mg and patient is to return to clinic in the next 2 to 3 weeks for  recheck of blood pressure by the clinical pharmacist to see if 5 mg is controlling patient's blood pressure or if dose needs to be further decreased if patient is having hypotension. - amLODipine (NORVASC) 5 MG tablet; Take 1 tablet (5 mg total) by mouth daily. To lower blood pressure  Dispense: 90 tablet; Refill: 3  2. Chronic pancreatitis, unspecified pancreatitis type ([place]); 3.  History of partial pancreatectomy/Whipple procedure; 4.  Protein calorie malnutrition, severe; encounter for examination following treatment at hospital Notes and labs from patient's recent emergency department visit discussed with the patient at today's visit.  He reports that he has recurrent episodes of abdominal pain along with nausea and vomiting.  He currently has some mild epigastric discomfort that is  not reproducible on exam.  He states that he believes the epigastric discomfort is currently due to being out of all of his medications including medication for acid reflux for more than a month.  At his recent emergency department visit he did have hypokalemia with potassium level of 3.2 but reports that he had had recurrent nausea and vomiting prior to his ED visit.  Will recheck lipase in follow-up of his chronic pancreatitis.  Refill provided of Creon to help with pancreatic insufficiency.  Prescription provided for Phenergan to help with nausea.  Refill provided at patient's request for tramadol to take as needed if he develops acute abdominal pain but he is aware that he should also seek emergency department follow-up if this occurs.  Discussed the use of Carnation instant breakfast mix with whole or 2% milk in order to help with malnutrition as patient reports that he does not really like the taste of Boost or Ensure. - Basic Metabolic Panel - Lipase - traMADol (ULTRAM) 50 MG tablet; Take 1 tablet (50 mg total) by mouth every 12 (twelve) hours as needed for moderate pain.  Dispense: 30 tablet; Refill: 2 - Pancrelipase,  Lip-Prot-Amyl, (CREON) 24000-76000 units CPEP; Take 1 capsule (24,000 Units total) by mouth with breakfast, with lunch, and with evening meal.  Dispense: 270 capsule; Refill: 3 - promethazine (PHENERGAN) 25 MG tablet; TAKE 1 TABLET BY MOUTH EVERY 8 HOURS AS NEEDED FOR NAUSEA OR VOMITING.  Dispense: 60 tablet; Refill: 3 - Hepatic Function Panel  5. Microcytic anemia Patient with microcytic anemia on blood work done 05/29/2020 with hemoglobin of 10.2 and MCV of 89.8.  He likely has anemia of chronic disease.  He denies any blood in the stool or black stools.  If he has had any further decline in hemoglobin, he may require GI referral for further evaluation. - CBC  6. Hypokalemia Repeat BMP at today's visit to check potassium as potassium was low at 3.2 during his emergency department visit on 01/08/8785. - Basic Metabolic Panel  7. Depression, unspecified depression type He reports that earlier in his life he was very active in sports and can no longer participate in these types of activities due to his chronic medical illnesses.  Refill provided for Wellbutrin to help with chronic depression related to chronic illness. - buPROPion (WELLBUTRIN SR) 100 MG 12 hr tablet; Take 1 tablet (100 mg total) by mouth 2 (two) times daily.  Dispense: 180 tablet; Refill: 1  8. Gastroesophageal reflux disease without esophagitis Refill provided for pantoprazole as well as Bentyl to take as needed to help with acid reflux and abdominal pain. - dicyclomine (BENTYL) 20 MG tablet; 1 pill up to 4 times per day as needed for abdominal pain  Dispense: 30 tablet; Refill: 5 - pantoprazole (PROTONIX) 40 MG tablet; Take 1 tablet (40 mg total) by mouth daily.  Dispense: 30 tablet; Refill: 0  9.  Need for immunization against influenza He reports that he has already received his COVID-19 immunization and when offered influenza immunization, he agreed to have this done at today's visit.   Follow-up:    Antony Blackbird, MD

## 2020-06-22 NOTE — Progress Notes (Signed)
Needs refill on Bentyl, phenergan, Tramadol, Protonix and amlodipine   Wants flu vaccine today  Concerned about weight loss

## 2020-06-22 NOTE — Patient Instructions (Signed)
Pancreatitis Eating Plan Pancreatitis is when your pancreas becomes irritated and swollen (inflamed). The pancreas is a small organ located behind your stomach. It helps your body digest food and regulate your blood sugar. Pancreatitis can affect how your body digests food, especially foods with fat. You may also have other symptoms such as abdominal pain or nausea. When you have pancreatitis, following a low-fat eating plan may help you manage symptoms and recover more quickly. Work with your health care provider or a diet and nutrition specialist (dietitian) to create an eating plan that is right for you. What are tips for following this plan? Reading food labels Use the information on food labels to help keep track of how much fat you eat:  Check the serving size.  Look for the amount of total fat in grams (g) in one serving. ? Low-fat foods have 3 g of fat or less per serving. ? Fat-free foods have 0.5 g of fat or less per serving.  Keep track of how much fat you eat based on how many servings you eat. ? For example, if you eat two servings, the amount of fat you eat will be two times what is listed on the label. Shopping   Buy low-fat or nonfat foods, such as: ? Fresh, frozen, or canned fruits and vegetables. ? Grains, including pasta, bread, and rice. ? Lean meat, poultry, fish, and other protein foods. ? Low-fat or nonfat dairy.  Avoid buying bakery products and other sweets made with whole milk, butter, and eggs.  Avoid buying snack foods with added fat, such as anything with butter or cheese flavoring. Cooking  Remove skin from poultry, and remove extra fat from meat.  Limit the amount of fat and oil you use to 6 teaspoons or less per day.  Cook using low-fat methods, such as boiling, broiling, grilling, steaming, or baking.  Use spray oil to cook. Add fat-free chicken broth to add flavor and moisture.  Avoid adding cream to thicken soups or sauces. Use other  thickeners such as corn starch or tomato paste. Meal planning   Eat a low-fat diet as told by your dietitian. For most people, this means having no more than 55-65 grams of fat each day.  Eat small, frequent meals throughout the day. For example, you may have 5-6 small meals instead of 3 large meals.  Drink enough fluid to keep your urine pale yellow.  Do not drink alcohol. Talk to your health care provider if you need help stopping.  Limit how much caffeine you have, including black coffee, black and green tea, caffeinated soft drinks, and energy drinks. General information  Let your health care provider or dietitian know if you have unplanned weight loss on this eating plan.  You may be instructed to follow a clear liquid diet during a flare of symptoms. Talk with your health care provider about how to manage your diet during symptoms of a flare.  Take any vitamins or supplements as told by your health care provider.  Work with a dietitian, especially if you have other conditions such as obesity or diabetes mellitus. What foods should I avoid? Fruits Fried fruits. Fruits served with butter or cream. Vegetables Fried vegetables. Vegetables cooked with butter, cheese, or cream. Grains Biscuits, waffles, donuts, pastries, and croissants. Pies and cookies. Butter-flavored popcorn. Regular crackers. Meats and other protein foods Fatty cuts of meat. Poultry with skin. Organ meats. Bacon, sausage, and cold cuts. Whole eggs. Nuts and nut butters. Dairy Whole and   2% milk. Whole milk yogurt. Whole milk ice cream. Cream and half-and-half. Cream cheese. Sour cream. Cheese. Beverages Wine, beer, and liquor. The items listed above may not be a complete list of foods and beverages to avoid. Contact a dietitian for more information. Summary  Pancreatitis can affect how your body digests food, especially foods with fat.  When you have pancreatitis, it is recommended that you follow a low-fat  eating plan to help you recover more quickly and manage symptoms. For most people, this means limiting fat to no more than 55-65 grams per day.  Do not drink alcohol. Limit the amount of caffeine you have, and drink enough fluid to keep your urine pale yellow. This information is not intended to replace advice given to you by your health care provider. Make sure you discuss any questions you have with your health care provider. Document Revised: 01/06/2019 Document Reviewed: 12/22/2017 Elsevier Patient Education  2020 [employer] Inc.  

## 2020-06-23 LAB — BASIC METABOLIC PANEL WITH GFR
BUN/Creatinine Ratio: 6 — ABNORMAL LOW (ref 9–20)
BUN: 4 mg/dL — ABNORMAL LOW (ref 6–24)
CO2: 21 mmol/L (ref 20–29)
Calcium: 8.8 mg/dL (ref 8.7–10.2)
Chloride: 103 mmol/L (ref 96–106)
Creatinine, Ser: 0.65 mg/dL — ABNORMAL LOW (ref 0.76–1.27)
GFR calc Af Amer: 128 mL/min/1.73
GFR calc non Af Amer: 110 mL/min/1.73
Glucose: 90 mg/dL (ref 65–99)
Potassium: 4.2 mmol/L (ref 3.5–5.2)
Sodium: 140 mmol/L (ref 134–144)

## 2020-06-23 LAB — HEPATIC FUNCTION PANEL
ALT: 16 IU/L (ref 0–44)
AST: 34 IU/L (ref 0–40)
Albumin: 3.5 g/dL — ABNORMAL LOW (ref 3.8–4.9)
Alkaline Phosphatase: 140 IU/L — ABNORMAL HIGH (ref 44–121)
Bilirubin Total: 0.2 mg/dL (ref 0.0–1.2)
Bilirubin, Direct: <0.1 mg/dL (ref 0.00–0.40)
Total Protein: 6.2 g/dL (ref 6.0–8.5)

## 2020-06-23 LAB — CBC
Hematocrit: 37.2 % — ABNORMAL LOW (ref 37.5–51.0)
Hemoglobin: 11.8 g/dL — ABNORMAL LOW (ref 13.0–17.7)
MCH: 26.7 pg (ref 26.6–33.0)
MCHC: 31.7 g/dL (ref 31.5–35.7)
MCV: 84 fL (ref 79–97)
Platelets: 418 x10E3/uL (ref 150–450)
RBC: 4.42 x10E6/uL (ref 4.14–5.80)
RDW: 16.6 % — ABNORMAL HIGH (ref 11.6–15.4)
WBC: 10.9 x10E3/uL — ABNORMAL HIGH (ref 3.4–10.8)

## 2020-06-23 LAB — LIPASE: Lipase: 5 U/L — ABNORMAL LOW (ref 13–78)

## 2020-07-06 ENCOUNTER — Ambulatory Visit: Payer: Medicaid Other | Admitting: Pharmacist

## 2020-07-20 ENCOUNTER — Encounter: Payer: Self-pay | Admitting: Pharmacist

## 2020-07-20 ENCOUNTER — Other Ambulatory Visit: Payer: Self-pay

## 2020-07-20 ENCOUNTER — Ambulatory Visit: Payer: Medicaid Other | Attending: Internal Medicine | Admitting: Pharmacist

## 2020-07-20 VITALS — BP 106/75 | HR 103

## 2020-07-20 DIAGNOSIS — I10 Essential (primary) hypertension: Secondary | ICD-10-CM

## 2020-07-20 NOTE — Progress Notes (Signed)
   S:    Patient arrives in good spirits. Presents to the clinic for hypertension evaluation, counseling, and management. Patient was referred and last seen by Primary Care Provider on 06/22/2020. BP at that visit was 118/87. Pt's amlodipine was restarted but at a lower dose than taken previously.   Today, he reports some DUE. Endorses fatigue and lightheadedness.  Denies chest pain, HA. Denies LE edema.   Medication adherence reported.  Current BP Medications include:  Amlodipine 5 mg daily   Dietary habits include: tries to limit salt in foods; denies drinking excess caffeine  Exercise habits include: reports that he is active  Family / Social history:  FHx: HTN, COPD, CAD Tobacco: current some day smoker  Alcohol: reports occasional use  O:  Vitals:   07/20/20 1512  BP: 106/75  Pulse: (!) 103   Home BP readings: none   Last 3 Office BP readings: BP Readings from Last 3 Encounters:  07/20/20 106/75  06/22/20 118/87  05/29/20 101/83    BMET    Component Value Date/Time   NA 140 06/22/2020 1648   K 4.2 06/22/2020 1648   CL 103 06/22/2020 1648   CO2 21 06/22/2020 1648   GLUCOSE 90 06/22/2020 1648   GLUCOSE 92 05/29/2020 0317   BUN 4 (L) 06/22/2020 1648   CREATININE 0.65 (L) 06/22/2020 1648   CREATININE 0.91 10/15/2015 1517   CALCIUM 8.8 06/22/2020 1648   GFRNONAA 110 06/22/2020 1648   GFRNONAA >89 10/15/2015 1517   GFRAA 128 06/22/2020 1648   GFRAA >89 10/15/2015 1517    Renal function: CrCl cannot be calculated (Patient's most recent lab result is older than the maximum 21 days allowed.).  Clinical ASCVD: No  The ASCVD Risk score Mikey Bussing DC Jr., et al., 2013) failed to calculate for the following reasons:   Cannot find a previous HDL lab   Cannot find a previous total cholesterol lab   A/P: Hypertension longstanding currently at goal on current medications. BP Goal = < 130/80 mmHg. Medication adherence reported. He is endorsing some symptoms of hypotension  and BP today is on the lower end of normal. Trial of 1 week without amlodipine and return for BP recheck off of medication.   -Hold amlodipine for now.  -F/u labs ordered - none -Counseled on lifestyle modifications for blood pressure control including reduced dietary sodium, increased exercise, adequate sleep.  Results reviewed and written information provided.   Total time in face-to-face counseling 15 minutes.   F/U Clinic Visit in 1 week.   Benard Halsted, PharmD, [place] [phone]

## 2020-07-31 ENCOUNTER — Telehealth: Payer: Self-pay | Admitting: Internal Medicine

## 2020-07-31 ENCOUNTER — Ambulatory Visit: Payer: Medicaid Other | Admitting: Pharmacist

## 2020-07-31 NOTE — Telephone Encounter (Signed)
[place] Message, please note Pt is not taking BP medication.   Copied from [place] [phone]. Topic: Appointment Scheduling - Scheduling Inquiry for Clinic >> Jul 31, 2020 12:26 PM Erick Blinks wrote: Luvenia Heller [phone] Wants call back to reschedule with Lurena Joiner, he is doing "okay" not taking his BP medication. He has a family emergency.

## 2020-08-01 ENCOUNTER — Ambulatory Visit: Payer: Medicaid Other | Admitting: Pharmacist

## 2020-08-01 NOTE — Telephone Encounter (Signed)
Pt has appt with you today at 4pm

## 2020-08-06 ENCOUNTER — Other Ambulatory Visit: Payer: Self-pay

## 2020-08-06 ENCOUNTER — Encounter: Payer: Self-pay | Admitting: Pharmacist

## 2020-08-06 ENCOUNTER — Ambulatory Visit: Payer: Medicaid Other | Attending: Internal Medicine | Admitting: Pharmacist

## 2020-08-06 VITALS — BP 108/78

## 2020-08-06 DIAGNOSIS — I10 Essential (primary) hypertension: Secondary | ICD-10-CM | POA: Diagnosis not present

## 2020-08-06 NOTE — Progress Notes (Signed)
   S:    Patient arrives in good spirits. Presents to the clinic for hypertension evaluation, counseling, and management. Patient was referred and last seen by Primary Care Provider on 06/22/2020. I saw him last on 07/20/2020 and instructed him to stop his amlodipine.   Today, he reports some improvement in fatigue and lightheadedness.  Denies chest pain, HA. Denies LE edema.   Medication adherence: currently holding his amlodipine. None since I last saw him.   Current BP Medications include:  Amlodipine 5 mg (hodling)  Dietary habits include: tries to limit salt in foods; denies drinking excess caffeine  Exercise habits include: reports that he is active  Family / Social history:  FHx: HTN, COPD, CAD Tobacco: current some day smoker  Alcohol: reports occasional use  O:  Vitals:   08/06/20 1516  BP: 108/78   Home BP readings: none   Last 3 Office BP readings: BP Readings from Last 3 Encounters:  08/06/20 108/78  07/20/20 106/75  06/22/20 118/87    BMET    Component Value Date/Time   NA 140 06/22/2020 1648   K 4.2 06/22/2020 1648   CL 103 06/22/2020 1648   CO2 21 06/22/2020 1648   GLUCOSE 90 06/22/2020 1648   GLUCOSE 92 05/29/2020 0317   BUN 4 (L) 06/22/2020 1648   CREATININE 0.65 (L) 06/22/2020 1648   CREATININE 0.91 10/15/2015 1517   CALCIUM 8.8 06/22/2020 1648   GFRNONAA 110 06/22/2020 1648   GFRNONAA >89 10/15/2015 1517   GFRAA 128 06/22/2020 1648   GFRAA >89 10/15/2015 1517    Renal function: CrCl cannot be calculated (Patient's most recent lab result is older than the maximum 21 days allowed.).  Clinical ASCVD: No  The ASCVD Risk score Mikey Bussing DC Jr., et al., 2013) failed to calculate for the following reasons:   Cannot find a previous HDL lab   Cannot find a previous total cholesterol lab   A/P: Hypertension longstanding currently at goal. BP Goal = < 130/80 mmHg. Will discontinue amlodipine and have him follow-up in 1 month.  -Discontinue amlodipine.    -F/u labs ordered - none -Counseled on lifestyle modifications for blood pressure control including reduced dietary sodium, increased exercise, adequate sleep.  Results reviewed and written information provided.   Total time in face-to-face counseling 15 minutes.   F/U Clinic Visit in 1 month.   Benard Halsted, PharmD, [place] [phone]

## 2020-08-26 ENCOUNTER — Other Ambulatory Visit: Payer: Self-pay

## 2020-08-26 ENCOUNTER — Emergency Department (HOSPITAL_COMMUNITY)
Admission: EM | Admit: 2020-08-26 | Discharge: 2020-08-26 | Disposition: A | Discharge status: Home / Self Care | Payer: Medicaid Other | Attending: Emergency Medicine | Admitting: Emergency Medicine

## 2020-08-26 DIAGNOSIS — D649 Anemia, unspecified: Secondary | ICD-10-CM | POA: Insufficient documentation

## 2020-08-26 DIAGNOSIS — G8929 Other chronic pain: Secondary | ICD-10-CM | POA: Insufficient documentation

## 2020-08-26 DIAGNOSIS — R112 Nausea with vomiting, unspecified: Secondary | ICD-10-CM | POA: Diagnosis not present

## 2020-08-26 DIAGNOSIS — I10 Essential (primary) hypertension: Secondary | ICD-10-CM | POA: Diagnosis not present

## 2020-08-26 DIAGNOSIS — F17210 Nicotine dependence, cigarettes, uncomplicated: Secondary | ICD-10-CM | POA: Insufficient documentation

## 2020-08-26 DIAGNOSIS — R109 Unspecified abdominal pain: Secondary | ICD-10-CM | POA: Diagnosis present

## 2020-08-26 DIAGNOSIS — Z7982 Long term (current) use of aspirin: Secondary | ICD-10-CM | POA: Diagnosis not present

## 2020-08-26 LAB — CBC WITH DIFFERENTIAL/PLATELET
Abs Immature Granulocytes: 0.11 10*3/uL — ABNORMAL HIGH (ref 0.00–0.07)
Basophils Absolute: 0.1 10*3/uL (ref 0.0–0.1)
Basophils Relative: 1 %
Eosinophils Absolute: 0.1 10*3/uL (ref 0.0–0.5)
Eosinophils Relative: 1 %
HCT: 38.7 % — ABNORMAL LOW (ref 39.0–52.0)
Hemoglobin: 12.4 g/dL — ABNORMAL LOW (ref 13.0–17.0)
Immature Granulocytes: 1 %
Lymphocytes Relative: 25 %
Lymphs Abs: 2.8 10*3/uL (ref 0.7–4.0)
MCH: 28.4 pg (ref 26.0–34.0)
MCHC: 32 g/dL (ref 30.0–36.0)
MCV: 88.6 fL (ref 80.0–100.0)
Monocytes Absolute: 0.9 10*3/uL (ref 0.1–1.0)
Monocytes Relative: 8 %
Neutro Abs: 7.2 10*3/uL (ref 1.7–7.7)
Neutrophils Relative %: 64 %
Platelets: 195 10*3/uL (ref 150–400)
RBC: 4.37 MIL/uL (ref 4.22–5.81)
RDW: 20.4 % — ABNORMAL HIGH (ref 11.5–15.5)
WBC: 11.1 10*3/uL — ABNORMAL HIGH (ref 4.0–10.5)
nRBC: 0 % (ref 0.0–0.2)

## 2020-08-26 LAB — LIPASE, BLOOD: Lipase: 19 U/L (ref 11–51)

## 2020-08-26 LAB — COMPREHENSIVE METABOLIC PANEL
ALT: 16 U/L (ref 0–44)
AST: 27 U/L (ref 15–41)
Albumin: 3.4 g/dL — ABNORMAL LOW (ref 3.5–5.0)
Alkaline Phosphatase: 112 U/L (ref 38–126)
Anion gap: 11 (ref 5–15)
BUN: 5 mg/dL — ABNORMAL LOW (ref 6–20)
CO2: 24 mmol/L (ref 22–32)
Calcium: 9.2 mg/dL (ref 8.9–10.3)
Chloride: 97 mmol/L — ABNORMAL LOW (ref 98–111)
Creatinine, Ser: 0.82 mg/dL (ref 0.61–1.24)
GFR, Estimated: >60 mL/min (ref >60)
Glucose, Bld: 112 mg/dL — ABNORMAL HIGH (ref 70–99)
Potassium: 3.9 mmol/L (ref 3.5–5.1)
Sodium: 132 mmol/L — ABNORMAL LOW (ref 135–145)
Total Bilirubin: 0.6 mg/dL (ref 0.3–1.2)
Total Protein: 6.5 g/dL (ref 6.5–8.1)

## 2020-08-26 MED ORDER — PROCHLORPERAZINE EDISYLATE 10 MG/2ML IJ SOLN
10.0000 mg | Freq: Once | INTRAMUSCULAR | Status: AC
  Administered 2020-08-26: 10 mg via INTRAVENOUS
  Filled 2020-08-26: qty 2

## 2020-08-26 MED ORDER — HYDROMORPHONE HCL 1 MG/ML IJ SOLN
1.0000 mg | Freq: Once | INTRAMUSCULAR | Status: AC
  Administered 2020-08-26: 1 mg via INTRAVENOUS
  Filled 2020-08-26: qty 1

## 2020-08-26 MED ORDER — LACTATED RINGERS IV BOLUS
1000.0000 mL | Freq: Once | INTRAVENOUS | Status: AC
  Administered 2020-08-26: 1000 mL via INTRAVENOUS

## 2020-08-26 MED ORDER — ONDANSETRON HCL 4 MG/2ML IJ SOLN
4.0000 mg | Freq: Once | INTRAMUSCULAR | Status: AC
  Administered 2020-08-26: 4 mg via INTRAVENOUS
  Filled 2020-08-26: qty 2

## 2020-08-26 MED ORDER — METOCLOPRAMIDE HCL 5 MG/ML IJ SOLN
10.0000 mg | Freq: Once | INTRAMUSCULAR | Status: AC
  Administered 2020-08-26: 10 mg via INTRAVENOUS
  Filled 2020-08-26: qty 2

## 2020-08-26 NOTE — ED Triage Notes (Signed)
Pt called EMS due to abdominal pain. Hx of pancreatitis and has intermittent flare ups throughout the year. Pt c/o nv and unable to eat very well.

## 2020-08-26 NOTE — ED Provider Notes (Signed)
[facility] EMERGENCY DEPARTMENT Provider Note   CSN: [id number] Arrival date & time:      History Chief Complaint  Patient presents with  . Abdominal Pain    Brent Rogers is a 54 y.o. male.  The history is provided by the patient.  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  He has not been able to hold anything down.  He has had 3 loose bowel movements a day.  Pain is in the left mid and upper abdomen with some radiation to the back and is typical for his flareups.  He rates pain at 10/10.  He normally takes promethazine for nausea and tramadol for pain, but has not been able to hold them down.  He denies fever or chills.  Stools have been dark, but he has been drinking a lot of Pepto-Bismol.  Past Medical History:  Diagnosis Date  . Benign tumor of endocrine pancreas   . Chronic pancreatitis ([place])    S/P Whipple  . Foot ulcer with fat layer exposed ([place])   . Hypertension   . Migraine    "@ least once/month" (11/12/2015)  . Pancreatic abnormality    CT  shows mass  . Pneumonia 11/12/2015  . Scoliosis     Patient Active Problem List   Diagnosis Date Noted  . 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 Chronic pain syndrome 06/07/2019  . Depression 06/07/2019  . Chronic pancreatitis ([place]) 05/01/2017  . Foot callus 12/25/2015  . Onychomycosis of toenail 12/25/2015  . Fatty liver 11/12/2015  . GERD (gastroesophageal reflux disease) 11/12/2015  . HTN (hypertension) 07/10/2015  . S/P cholecystectomy 07/10/2015  . Tobacco abuse 12/26/2014  . Protein-calorie malnutrition, severe ([place]) 11/20/2014  . Recurrent pancreatitis 06/06/2014  . Exocrine pancreatic insufficiency ([place]) 12/23/2013    Past Surgical History:  Procedure Laterality Date  . BACK SURGERY    . EUS N/A 09/21/2013    Procedure: ESOPHAGEAL ENDOSCOPIC ULTRASOUND (EUS) RADIAL;  Surgeon: Beryle Beams, MD;  Location: [facility] ENDOSCOPY;  Service: Endoscopy;  Laterality: N/A;  . EUS N/A 09/30/2013   Procedure: UPPER ENDOSCOPIC ULTRASOUND (EUS) LINEAR;  Surgeon: Beryle Beams, MD;  Location: [facility] ENDOSCOPY;  Service: Endoscopy;  Laterality: N/A;  . FINE NEEDLE ASPIRATION N/A 09/21/2013   Procedure: FINE NEEDLE ASPIRATION (FNA) LINEAR;  Surgeon: Beryle Beams, MD;  Location: [facility] ENDOSCOPY;  Service: Endoscopy;  Laterality: N/A;  . FINGER FRACTURE SURGERY Left 1995   5th digit  . FRACTURE SURGERY    . KNEE ARTHROSCOPY Bilateral 1987-1989   right-left  . LAPAROSCOPY N/A 12/01/2013   Procedure: LAPAROSCOPY DIAGNOSTIC PANCREATICODUODENECTOMY WITH BILIARY AND PANCREATIC STENTS;  Surgeon: Stark Klein, MD;  Location: [facility] ORS;  Service: General;  Laterality: N/A;  . LUMBAR [place] SURGERY  1990's  . WHIPPLE PROCEDURE  12/01/2013       Family History  Problem Relation Age of Onset  . Cancer Mother        unsure  . COPD Mother   . Hypertension Mother   . Hypertension Father   . COPD Father   . CAD Sister        possible has stent per pt    Social History   Tobacco Use  . Smoking status: Current Every Day Smoker    Packs/day: 0.25    Years: 30.00    Pack years:  7.50    Types: Cigarettes  . Smokeless tobacco: Former Systems developer    Types: Snuff  . Tobacco comment: "used snuff 10-15 years in my 20s-30s"  Vaping Use  . Vaping Use: Never used  Substance Use Topics  . Alcohol use: Yes    Alcohol/week: 4.0 standard drinks    Types: 4 Cans of beer per week    Comment: 05/01/16  . Drug use: No    Home Medications Prior to Admission medications   Medication Sig Start Date End Date Taking? Authorizing Provider  acetaminophen (TYLENOL) 500 MG tablet Take 1,000 mg by mouth every 6 (six) hours as needed for mild pain.    [provider]  aspirin 81 MG EC tablet Take 81 mg by mouth as needed for pain.    [provider]  Aspirin-Acetaminophen-Caffeine (GOODY HEADACHE PO) Take 1 packet by mouth as needed (pain, headache).    [provider]  bismuth subsalicylate (PEPTO BISMOL) 262 MG/15ML suspension Take 30 mLs by mouth every 6 (six) hours as needed for indigestion or diarrhea or loose stools.    [provider]  buPROPion (WELLBUTRIN SR) 100 MG 12 hr tablet Take 1 tablet (100 mg total) by mouth 2 (two) times daily. 06/22/20   Fulp, Cammie, MD  dicyclomine (BENTYL) 20 MG tablet 1 pill up to 4 times per day as needed for abdominal pain 06/22/20   Fulp, Cammie, MD  ibuprofen (ADVIL) 200 MG tablet Take 800-1,000 mg by mouth every 6 (six) hours as needed for moderate pain.    [provider]  Pancrelipase, Lip-Prot-Amyl, (CREON) 24000-76000 units CPEP Take 1 capsule (24,000 Units total) by mouth with breakfast, with lunch, and with evening meal. 06/22/20   Fulp, Cammie, MD  pantoprazole (PROTONIX) 40 MG tablet Take 1 tablet (40 mg total) by mouth daily. 06/22/20   Fulp, Cammie, MD  promethazine (PHENERGAN) 25 MG tablet TAKE 1 TABLET BY MOUTH EVERY 8 HOURS AS NEEDED FOR NAUSEA OR VOMITING. 06/22/20   Fulp, Cammie, MD  traMADol (ULTRAM) 50 MG tablet Take 1 tablet (50 mg total) by mouth every 12 (twelve) hours as needed for moderate pain. 06/22/20   Antony Blackbird, MD    Allergies    Patient has no known allergies.  Review of Systems   Review of Systems  All other systems reviewed and are negative.   Physical Exam Updated Vital Signs BP (!) 134/107 (BP Location: Right Arm)   Pulse 97   Temp 98.7 F (37.1 C) (Oral)   Resp 12   Ht 6\' 3"  (1.905 m)   Wt 64 kg   SpO2 98%   BMI 17.62 kg/m   Physical Exam Vitals and nursing note reviewed.   Somewhat cachectic 54 year old male, resting comfortably and in no acute distress. Vital signs are significant for elevated blood pressure. Oxygen saturation is 98%, which is normal. Head is normocephalic and atraumatic. PERRLA, EOMI.  Oropharynx is clear. Neck is nontender and supple without adenopathy or JVD. Back is nontender and there is no CVA tenderness. Lungs are clear without rales, wheezes, or rhonchi. Chest is nontender. Heart has regular rate and rhythm without murmur. Abdomen is soft, flat, with mild to moderate tenderness in the left mid and upper abdomen.  There is no rebound or guarding.  There are no masses or hepatosplenomegaly and peristalsis is hypoactive. Extremities have no cyanosis or edema, full range of motion is present. Skin is warm and dry without rash. Neurologic: Mental status is  normal, cranial nerves are intact, there are no motor or sensory deficits.  ED Results / Procedures / Treatments   Labs (all labs ordered are listed, but only abnormal results are displayed) Labs Reviewed - No data to display  EKG EKG Interpretation  Date/Time:  Sunday August 26 2020 00:17:10 EST Ventricular Rate:  94 PR Interval:    QRS Duration: 80 QT Interval:  344 QTC Calculation: 431 R Axis:   69 Text Interpretation: Sinus rhythm RAE, consider biatrial enlargement When compared with ECG of 01/29/2020, No significant change was found Confirmed by Delora Fuel ([id number]) on 08/26/2020 12:22:36 AM   Radiology No results found.  Procedures Procedures   Medications Ordered in ED Medications  lactated ringers bolus 1,000 mL (has no administration in time range)  HYDROmorphone (DILAUDID) injection 1 mg (has no administration in time range)  ondansetron (ZOFRAN) injection 4 mg (has no administration in time range)    ED Course  I have reviewed the triage vital signs and the nursing notes.  Pertinent labs & imaging results that were available during my care of the patient were reviewed by me and considered in my medical decision making (see chart for details).  MDM Rules/Calculators/A&P Acute exacerbation of chronic abdominal pain/chronic pancreatitis.  No red flags to suggest more serious pathology.   Will check screening labs, give IV fluids, hydromorphone, ondansetron.  Old records are reviewed, and he has multiple similar presentations to the ED.  2:08 AM Labs are unremarkable.  He has mild hyponatremia which is not felt to be clinically significant.  After above-noted treatment, pain and nausea have improved slightly.  He will be given additional IV fluids, hydromorphone, prochlorperazine.  4:50 AM He got further relief with second dose of hydromorphone, but was still feelings significant amount of pain.  He is given a third dose of hydromorphone dose of metoclopramide with significant improvement.  He is felt to be safe for discharge.  He is instructed to continue his home medication regimen and follow-up with his primary care provider.  Final Clinical Impression(s) / ED Diagnoses Final diagnoses:  Chronic abdominal pain  Non-intractable vomiting with nausea, unspecified vomiting type  Normochromic normocytic anemia    Rx / DC Orders ED Discharge Orders    None       Delora Fuel, MD 76/80/88 [phone]

## 2020-08-26 NOTE — ED Notes (Signed)
With charge approval, pt given taxi voucher

## 2020-08-29 ENCOUNTER — Other Ambulatory Visit: Payer: Self-pay

## 2020-08-29 ENCOUNTER — Other Ambulatory Visit: Payer: Self-pay | Admitting: Internal Medicine

## 2020-08-29 ENCOUNTER — Ambulatory Visit: Payer: Self-pay

## 2020-08-29 ENCOUNTER — Encounter (HOSPITAL_COMMUNITY): Payer: Self-pay

## 2020-08-29 DIAGNOSIS — Z825 Family history of asthma and other chronic lower respiratory diseases: Secondary | ICD-10-CM

## 2020-08-29 DIAGNOSIS — Z79899 Other long term (current) drug therapy: Secondary | ICD-10-CM

## 2020-08-29 DIAGNOSIS — R300 Dysuria: Secondary | ICD-10-CM

## 2020-08-29 DIAGNOSIS — Z809 Family history of malignant neoplasm, unspecified: Secondary | ICD-10-CM

## 2020-08-29 DIAGNOSIS — N12 Tubulo-interstitial nephritis, not specified as acute or chronic: Principal | ICD-10-CM | POA: Diagnosis present

## 2020-08-29 DIAGNOSIS — K2970 Gastritis, unspecified, without bleeding: Secondary | ICD-10-CM | POA: Diagnosis present

## 2020-08-29 DIAGNOSIS — R197 Diarrhea, unspecified: Secondary | ICD-10-CM | POA: Diagnosis present

## 2020-08-29 DIAGNOSIS — Z90411 Acquired partial absence of pancreas: Secondary | ICD-10-CM

## 2020-08-29 DIAGNOSIS — Z8249 Family history of ischemic heart disease and other diseases of the circulatory system: Secondary | ICD-10-CM

## 2020-08-29 DIAGNOSIS — I10 Essential (primary) hypertension: Secondary | ICD-10-CM | POA: Diagnosis present

## 2020-08-29 DIAGNOSIS — Z20822 Contact with and (suspected) exposure to covid-19: Secondary | ICD-10-CM | POA: Diagnosis present

## 2020-08-29 DIAGNOSIS — E876 Hypokalemia: Secondary | ICD-10-CM | POA: Diagnosis present

## 2020-08-29 DIAGNOSIS — K219 Gastro-esophageal reflux disease without esophagitis: Secondary | ICD-10-CM

## 2020-08-29 DIAGNOSIS — K861 Other chronic pancreatitis: Secondary | ICD-10-CM | POA: Diagnosis present

## 2020-08-29 DIAGNOSIS — F17210 Nicotine dependence, cigarettes, uncomplicated: Secondary | ICD-10-CM | POA: Diagnosis present

## 2020-08-29 DIAGNOSIS — D509 Iron deficiency anemia, unspecified: Secondary | ICD-10-CM | POA: Diagnosis present

## 2020-08-29 DIAGNOSIS — K811 Chronic cholecystitis: Secondary | ICD-10-CM | POA: Diagnosis present

## 2020-08-29 DIAGNOSIS — R310 Gross hematuria: Secondary | ICD-10-CM | POA: Diagnosis present

## 2020-08-29 DIAGNOSIS — R739 Hyperglycemia, unspecified: Secondary | ICD-10-CM | POA: Diagnosis present

## 2020-08-29 LAB — URINALYSIS, ROUTINE W REFLEX MICROSCOPIC
Bilirubin Urine: NEGATIVE
Glucose, UA: NEGATIVE mg/dL
Hgb urine dipstick: NEGATIVE
Ketones, ur: NEGATIVE mg/dL
Leukocytes,Ua: NEGATIVE
Nitrite: POSITIVE — AB
Protein, ur: NEGATIVE mg/dL
Specific Gravity, Urine: 1.013 (ref 1.005–1.030)
pH: 5 (ref 5.0–8.0)

## 2020-08-29 LAB — COMPREHENSIVE METABOLIC PANEL
ALT: 19 U/L (ref 0–44)
AST: 27 U/L (ref 15–41)
Albumin: 3.4 g/dL — ABNORMAL LOW (ref 3.5–5.0)
Alkaline Phosphatase: 104 U/L (ref 38–126)
Anion gap: 12 (ref 5–15)
BUN: 7 mg/dL (ref 6–20)
CO2: 24 mmol/L (ref 22–32)
Calcium: 9.1 mg/dL (ref 8.9–10.3)
Chloride: 101 mmol/L (ref 98–111)
Creatinine, Ser: 1.02 mg/dL (ref 0.61–1.24)
GFR, Estimated: >60 mL/min (ref >60)
Glucose, Bld: 124 mg/dL — ABNORMAL HIGH (ref 70–99)
Potassium: 3.1 mmol/L — ABNORMAL LOW (ref 3.5–5.1)
Sodium: 137 mmol/L (ref 135–145)
Total Bilirubin: 0.6 mg/dL (ref 0.3–1.2)
Total Protein: 6.3 g/dL — ABNORMAL LOW (ref 6.5–8.1)

## 2020-08-29 LAB — CBC
HCT: 38 % — ABNORMAL LOW (ref 39.0–52.0)
Hemoglobin: 12.2 g/dL — ABNORMAL LOW (ref 13.0–17.0)
MCH: 28.5 pg (ref 26.0–34.0)
MCHC: 32.1 g/dL (ref 30.0–36.0)
MCV: 88.8 fL (ref 80.0–100.0)
Platelets: 256 10*3/uL (ref 150–400)
RBC: 4.28 MIL/uL (ref 4.22–5.81)
RDW: 20.2 % — ABNORMAL HIGH (ref 11.5–15.5)
WBC: 13.1 10*3/uL — ABNORMAL HIGH (ref 4.0–10.5)
nRBC: 0 % (ref 0.0–0.2)

## 2020-08-29 LAB — LIPASE, BLOOD: Lipase: 18 U/L (ref 11–51)

## 2020-08-29 MED ORDER — PANTOPRAZOLE SODIUM 40 MG PO TBEC: 40.0000 mg | DELAYED_RELEASE_TABLET | Freq: Every day | ORAL | 0 refills | Status: DC

## 2020-08-29 MED ORDER — PROMETHAZINE HCL 25 MG PO TABS: ORAL_TABLET | ORAL | 3 refills | Status: DC

## 2020-08-29 MED ORDER — CIPROFLOXACIN HCL 500 MG PO TABS: 500.0000 mg | ORAL_TABLET | Freq: Two times a day (BID) | ORAL | 0 refills | Status: DC

## 2020-08-29 MED FILL — CIPROFLOXACIN HCL 500 MG TA: 500 | 7 days supply | Qty: 14 | Fill #0

## 2020-08-29 MED FILL — PROMETHAZINE HCL 25 MG TABS: 25 | 20 days supply | Qty: 60 | Fill #0

## 2020-08-29 MED FILL — PANTOPRAZOLE SOD DR 40 MG T: 40 | 30 days supply | Qty: 30 | Fill #0

## 2020-08-29 NOTE — ED Triage Notes (Signed)
Pt arrives EMS with c/o chronic pancreatitis. Pt reports being seen at the hospital the other day for the same. Pt sts new onset left sided flank pain and hematuria starting today.

## 2020-08-29 NOTE — Telephone Encounter (Signed)
Will forward to pcp

## 2020-08-29 NOTE — Telephone Encounter (Signed)
Patient called stating that he was seen in the hospital Saturday 11/27 for chronic abdominal pain and nausea. He states he ws sent home. He states that he has chronic condition of pancreatitis.He states that he is now having pain to his lower abdomin and left flank every time he urinates or has a BM. He has been taking tylenol but still rates pain at 9. He states that he feel he has a UTI. He has no fever. He states his abdomin bloats with every meal. He refused appointment today because he has first of the month payments to make. I suggested UC because they have later hours. He states he may go but he already has appointment in office Monday. He states that he will be available tomorrow or even Friday. I explained I was unable to change the disposition and that he should be seen today. Care advice read to patient. He verbalized understanding of all information. He states he is out of several medications which will be attached to this document for review by office.  Reason for Disposition . [1] SEVERE pain with urination  (e.g., excruciating) AND [2] not improved after 2 hours of pain medicine (e.g., acetaminophen or ibuprofen)  Answer Assessment - Initial Assessment Questions 1. SEVERITY: "How bad is the pain?"  (e.g., Scale 1-10; mild, moderate, or severe)   - MILD (1-3): complains slightly about urination hurting   - MODERATE (4-7): interferes with normal activities     - SEVERE (8-10): excruciating, unwilling or unable to urinate because of the pain      9 2. FREQUENCY: "How many times have you had painful urination today?"     Every BM and urination and eating 3. PATTERN: "Is pain present every time you urinate or just sometimes?"      yes 4. ONSET: "When did the painful urination start?"      3 weeks 5. FEVER: "Do you have a fever?" If Yes, ask: "What is your temperature, how was it measured, and when did it start?"    no 6. PAST UTI: "Have you had a urine infection before?" If Yes, ask:  "When was the last time?" and "What happened that time?"     Yes but never treated 7. CAUSE: "What do you think is causing the painful urination?"     UTI 8. OTHER SYMPTOMS: "Do you have any other symptoms?" (e.g., flank pain, penile discharge, scrotal pain, blood in urine)    Left side and back Checked in ER Saturday night for pancreatic  Protocols used: URINATION PAIN - MALE-A-AH

## 2020-08-29 NOTE — Telephone Encounter (Signed)
Contacted pt to go over Dr. Wynetta Emery message pt states he understands and will come by tomorrow

## 2020-08-30 ENCOUNTER — Inpatient Hospital Stay (HOSPITAL_COMMUNITY)
Admission: EM | Admit: 2020-08-30 | Discharge: 2020-09-01 | DRG: 690 | Disposition: A | Discharge status: Home / Self Care | Payer: Medicaid Other | Attending: Internal Medicine | Admitting: Internal Medicine

## 2020-08-30 ENCOUNTER — Emergency Department (HOSPITAL_COMMUNITY): Payer: Medicaid Other

## 2020-08-30 DIAGNOSIS — R1012 Left upper quadrant pain: Secondary | ICD-10-CM

## 2020-08-30 DIAGNOSIS — R109 Unspecified abdominal pain: Secondary | ICD-10-CM | POA: Diagnosis present

## 2020-08-30 DIAGNOSIS — N10 Acute tubulo-interstitial nephritis: Secondary | ICD-10-CM | POA: Diagnosis not present

## 2020-08-30 DIAGNOSIS — K219 Gastro-esophageal reflux disease without esophagitis: Secondary | ICD-10-CM

## 2020-08-30 LAB — CBC
HCT: 32 % — ABNORMAL LOW (ref 39.0–52.0)
Hemoglobin: 10.2 g/dL — ABNORMAL LOW (ref 13.0–17.0)
MCH: 28.7 pg (ref 26.0–34.0)
MCHC: 31.9 g/dL (ref 30.0–36.0)
MCV: 90.1 fL (ref 80.0–100.0)
Platelets: 229 10*3/uL (ref 150–400)
RBC: 3.55 MIL/uL — ABNORMAL LOW (ref 4.22–5.81)
RDW: 19.9 % — ABNORMAL HIGH (ref 11.5–15.5)
WBC: 10.4 10*3/uL (ref 4.0–10.5)
nRBC: 0 % (ref 0.0–0.2)

## 2020-08-30 LAB — RESP PANEL BY RT-PCR (FLU A&B, COVID) ARPGX2
Influenza A by PCR: NEGATIVE
Influenza B by PCR: NEGATIVE
SARS Coronavirus 2 by RT PCR: NEGATIVE

## 2020-08-30 LAB — CREATININE, SERUM
Creatinine, Ser: 0.9 mg/dL (ref 0.61–1.24)
GFR, Estimated: >60 mL/min (ref >60)

## 2020-08-30 LAB — MAGNESIUM: Magnesium: 1.6 mg/dL — ABNORMAL LOW (ref 1.7–2.4)

## 2020-08-30 LAB — HIV ANTIBODY (ROUTINE TESTING W REFLEX): HIV Screen 4th Generation wRfx: NONREACTIVE

## 2020-08-30 MED ORDER — MORPHINE SULFATE (PF) 4 MG/ML IV SOLN
4.0000 mg | Freq: Once | INTRAVENOUS | Status: AC
  Administered 2020-08-30: 4 mg via INTRAVENOUS
  Filled 2020-08-30: qty 1

## 2020-08-30 MED ORDER — ENOXAPARIN SODIUM 40 MG/0.4ML ~~LOC~~ SOLN
40.0000 mg | SUBCUTANEOUS | Status: DC
  Administered 2020-08-30 – 2020-08-31 (×2): 40 mg via SUBCUTANEOUS
  Filled 2020-08-30 (×2): qty 0.4

## 2020-08-30 MED ORDER — ACETAMINOPHEN 325 MG PO TABS: 650.0000 mg | ORAL_TABLET | Freq: Four times a day (QID) | ORAL | Status: DC | PRN

## 2020-08-30 MED ORDER — BUPROPION HCL ER (SR) 100 MG PO TB12
100.0000 mg | ORAL_TABLET | Freq: Two times a day (BID) | ORAL | Status: DC
  Administered 2020-08-30 – 2020-09-01 (×5): 100 mg via ORAL
  Filled 2020-08-30 (×5): qty 1

## 2020-08-30 MED ORDER — POTASSIUM CHLORIDE 10 MEQ/100ML IV SOLN
10.0000 meq | Freq: Once | INTRAVENOUS | Status: AC
  Administered 2020-08-30: 10 meq via INTRAVENOUS
  Filled 2020-08-30: qty 100

## 2020-08-30 MED ORDER — MORPHINE SULFATE (PF) 2 MG/ML IV SOLN
2.0000 mg | INTRAVENOUS | Status: DC | PRN
  Administered 2020-08-30 – 2020-09-01 (×7): 2 mg via INTRAVENOUS
  Filled 2020-08-30 (×7): qty 1

## 2020-08-30 MED ORDER — PANCRELIPASE (LIP-PROT-AMYL) 12000-38000 UNITS PO CPEP
24000.0000 [IU] | ORAL_CAPSULE | Freq: Three times a day (TID) | ORAL | Status: DC
  Administered 2020-08-30 – 2020-09-01 (×6): 24000 [IU] via ORAL
  Filled 2020-08-30 (×6): qty 2

## 2020-08-30 MED ORDER — SODIUM CHLORIDE 0.9 % IV SOLN
1.0000 g | Freq: Once | INTRAVENOUS | Status: AC
  Administered 2020-08-30: 1 g via INTRAVENOUS
  Filled 2020-08-30: qty 10

## 2020-08-30 MED ORDER — POTASSIUM CHLORIDE CRYS ER 20 MEQ PO TBCR
40.0000 meq | EXTENDED_RELEASE_TABLET | Freq: Every day | ORAL | Status: DC
  Administered 2020-08-30 – 2020-08-31 (×2): 40 meq via ORAL
  Filled 2020-08-30 (×2): qty 2

## 2020-08-30 MED ORDER — ACETAMINOPHEN 650 MG RE SUPP: 650.0000 mg | Freq: Four times a day (QID) | RECTAL | Status: DC | PRN

## 2020-08-30 MED ORDER — DICYCLOMINE HCL 20 MG PO TABS
20.0000 mg | ORAL_TABLET | Freq: Four times a day (QID) | ORAL | Status: DC | PRN
  Administered 2020-08-30 – 2020-08-31 (×2): 20 mg via ORAL
  Filled 2020-08-30 (×2): qty 1

## 2020-08-30 MED ORDER — ONDANSETRON HCL 4 MG/2ML IJ SOLN
4.0000 mg | Freq: Once | INTRAMUSCULAR | Status: AC
  Administered 2020-08-30: 4 mg via INTRAVENOUS
  Filled 2020-08-30: qty 2

## 2020-08-30 MED ORDER — ONDANSETRON HCL 4 MG PO TABS: 4.0000 mg | ORAL_TABLET | Freq: Four times a day (QID) | ORAL | Status: DC | PRN

## 2020-08-30 MED ORDER — PANTOPRAZOLE SODIUM 40 MG PO TBEC
40.0000 mg | DELAYED_RELEASE_TABLET | Freq: Every day | ORAL | Status: DC
  Administered 2020-08-30 – 2020-09-01 (×3): 40 mg via ORAL
  Filled 2020-08-30 (×3): qty 1

## 2020-08-30 MED ORDER — SODIUM CHLORIDE 0.9 % IV SOLN
2.0000 g | INTRAVENOUS | Status: DC
  Administered 2020-08-31 – 2020-09-01 (×2): 2 g via INTRAVENOUS
  Filled 2020-08-30: qty 2
  Filled 2020-08-30 (×2): qty 20

## 2020-08-30 MED ORDER — LACTATED RINGERS IV SOLN: Freq: Once | INTRAVENOUS | Status: AC

## 2020-08-30 MED ORDER — LACTATED RINGERS IV BOLUS
1000.0000 mL | Freq: Once | INTRAVENOUS | Status: AC
  Administered 2020-08-30: 1000 mL via INTRAVENOUS

## 2020-08-30 MED ORDER — ONDANSETRON HCL 4 MG/2ML IJ SOLN
4.0000 mg | Freq: Four times a day (QID) | INTRAMUSCULAR | Status: DC | PRN
  Administered 2020-08-30 – 2020-09-01 (×3): 4 mg via INTRAVENOUS
  Filled 2020-08-30 (×3): qty 2

## 2020-08-30 NOTE — H&P (Signed)
History and Physical    MANDY FITZWATER PJA:[id number] DOB: 06-08-1966 DOA: 08/30/2020  PCP: Ladell Pier, MD  Patient coming from: Home  Chief Complaint: left ab pain and flank pain.  HPI: Brent Rogers is a 54 y.o. male with medical history significant of whipple procedure, chronic pancreatitis. Presenting with left abdomen and flank pain. Symptoms have been episodic over the last 3 weeks. Sharp pain radiating from left abdomen to left flank. He has had N/V/D during this same time. He thought with his constellation of symptoms that he was having a flare of pancreatitis or problems from his whipple surgery. So he went to the ED 5 days ago. His work was negative and he was sent home. He continued to have pain, so he spoke with his PCP. The recommendation to return to the ED was made. He wasn't going to go, but he noticed that his urine had darkened and he felt that he could have passed some blood in his urine. He became concerned and came to the ED.     ED Course: Imaging showed possible pyelonephritis (no stone) of left kidney, a possible anastomotic ulceration near his whipple procedure, and a possible obstruction of the small bowel. General surgery was called. They recommended obs admission. TRH was called for admission.   Review of Systems:  Denies CP, palpitations, dyspnea. Review of systems is otherwise negative for all not mentioned in HPI.   PMHx Past Medical History:  Diagnosis Date  . Benign tumor of endocrine pancreas   . Chronic pancreatitis ([place])    S/P Whipple  . Foot ulcer with fat layer exposed ([place])   . Hypertension   . Migraine    "@ least once/month" (11/12/2015)  . Pancreatic abnormality    CT  shows mass  . Pneumonia 11/12/2015  . Scoliosis     PSHx Past Surgical History:  Procedure Laterality Date  . BACK SURGERY    . EUS N/A 09/21/2013   Procedure: ESOPHAGEAL ENDOSCOPIC ULTRASOUND (EUS) RADIAL;  Surgeon: Beryle Beams, MD;  Location: [facility];   Service: Endoscopy;  Laterality: N/A;  . EUS N/A 09/30/2013   Procedure: UPPER ENDOSCOPIC ULTRASOUND (EUS) LINEAR;  Surgeon: Beryle Beams, MD;  Location: [facility];  Service: Endoscopy;  Laterality: N/A;  . FINE NEEDLE ASPIRATION N/A 09/21/2013   Procedure: FINE NEEDLE ASPIRATION (FNA) LINEAR;  Surgeon: Beryle Beams, MD;  Location: [facility];  Service: Endoscopy;  Laterality: N/A;  . FINGER FRACTURE SURGERY Left 1995   5th digit  . FRACTURE SURGERY    . KNEE ARTHROSCOPY Bilateral 1987-1989   right-left  . LAPAROSCOPY N/A 12/01/2013   Procedure: LAPAROSCOPY DIAGNOSTIC PANCREATICODUODENECTOMY WITH BILIARY AND PANCREATIC STENTS;  Surgeon: Stark Klein, MD;  Location: [facility];  Service: General;  Laterality: N/A;  . LUMBAR [place] SURGERY  1990's  . WHIPPLE PROCEDURE  12/01/2013    SocHx  reports that he has been smoking cigarettes. He has a 7.50 pack-year smoking history. He has quit using smokeless tobacco.  His smokeless tobacco use included snuff. He reports current alcohol use of about 4.0 standard drinks of alcohol per week. He reports that he does not use drugs.  No Known Allergies  FamHx Family History  Problem Relation Age of Onset  . Cancer Mother        unsure  . COPD Mother   . Hypertension Mother   . Hypertension Father   . COPD Father   . CAD Sister  possible has stent per pt    Prior to Admission medications   Medication Sig Start Date End Date Taking? Authorizing Provider  acetaminophen (TYLENOL) 500 MG tablet Take 1,000 mg by mouth every 6 (six) hours as needed for mild pain.    [provider]  aspirin 81 MG EC tablet Take 81 mg by mouth as needed for pain.    [provider]  Aspirin-Acetaminophen-Caffeine (GOODY HEADACHE PO) Take 1 packet by mouth as needed (pain, headache).    [provider]  bismuth subsalicylate (PEPTO BISMOL) 262 MG/15ML suspension Take 30 mLs by mouth every 6 (six) hours as needed for indigestion or  diarrhea or loose stools.    [provider]  buPROPion (WELLBUTRIN SR) 100 MG 12 hr tablet Take 1 tablet (100 mg total) by mouth 2 (two) times daily. 06/22/20   Fulp, Cammie, MD  ciprofloxacin (CIPRO) 500 MG tablet Take 1 tablet (500 mg total) by mouth 2 (two) times daily. 08/29/20   Ladell Pier, MD  dicyclomine (BENTYL) 20 MG tablet 1 pill up to 4 times per day as needed for abdominal pain 06/22/20   Fulp, Cammie, MD  ibuprofen (ADVIL) 200 MG tablet Take 800-1,000 mg by mouth every 6 (six) hours as needed for moderate pain.    [provider]  Pancrelipase, Lip-Prot-Amyl, (CREON) 24000-76000 units CPEP Take 1 capsule (24,000 Units total) by mouth with breakfast, with lunch, and with evening meal. 06/22/20   Fulp, Cammie, MD  pantoprazole (PROTONIX) 40 MG tablet Take 1 tablet (40 mg total) by mouth daily. 08/29/20   Ladell Pier, MD  promethazine (PHENERGAN) 25 MG tablet TAKE 1 TABLET BY MOUTH EVERY 8 HOURS AS NEEDED FOR NAUSEA OR VOMITING. 08/29/20   Ladell Pier, MD  traMADol (ULTRAM) 50 MG tablet Take 1 tablet (50 mg total) by mouth every 12 (twelve) hours as needed for moderate pain. 06/22/20   Antony Blackbird, MD    Physical Exam: Vitals:   08/30/20 0750 08/30/20 0755 08/30/20 0830 08/30/20 0933  BP: (!) 141/94  (!) 141/99 (!) 119/94  Pulse: 68  77 72  Resp: 15  (!) 26 16  Temp:  98 F (36.7 C)    TempSrc:  Oral    SpO2: 100%  95% 100%  Weight:      Height:        General: 54 y.o. male resting in bed in NAD Eyes: PERRL, normal sclera ENMT: Nares patent w/o discharge, orophaynx clear, dentition normal, ears w/o discharge/lesions/ulcers Neck: Supple, trachea midline Cardiovascular: RRR, +S1, S2, no m/g/r, equal pulses throughout Respiratory: CTABL, no w/r/r, normal WOB GI: BS+, ND, old midline surgical scarring noted, TTP LUQ/LLQ, L CVAT noted, no masses noted, no organomegaly noted MSK: No e/c/c Skin: No rashes, bruises, ulcerations noted Neuro: A&O x  3, no focal deficits Psyc: Appropriate interaction and affect, calm/cooperative  Labs on Admission: I have personally reviewed following labs and imaging studies  CBC: Recent Labs  Lab 08/26/20 0104 08/29/20 2005  WBC 11.1* 13.1*  NEUTROABS 7.2  --   HGB 12.4* 12.2*  HCT 38.7* 38.0*  MCV 88.6 88.8  PLT 195 242   Basic Metabolic Panel: Recent Labs  Lab 08/26/20 0104 08/29/20 2005  NA 132* 137  K 3.9 3.1*  CL 97* 101  CO2 24 24  GLUCOSE 112* 124*  BUN 5* 7  CREATININE 0.82 1.02  CALCIUM 9.2 9.1   GFR: Estimated Creatinine Clearance: 79.6 mL/min (by C-G formula based on  SCr of 1.02 mg/dL). Liver Function Tests: Recent Labs  Lab 08/26/20 0104 08/29/20 2005  AST 27 27  ALT 16 19  ALKPHOS 112 104  BILITOT 0.6 0.6  PROT 6.5 6.3*  ALBUMIN 3.4* 3.4*   Recent Labs  Lab 08/26/20 0104 08/29/20 2005  LIPASE 19 18   No results for input(s): AMMONIA in the last 168 hours. Coagulation Profile: No results for input(s): INR, PROTIME in the last 168 hours. Cardiac Enzymes: No results for input(s): CKTOTAL, CKMB, CKMBINDEX, TROPONINI in the last 168 hours. BNP (last 3 results) No results for input(s): PROBNP in the last 8760 hours. HbA1C: No results for input(s): HGBA1C in the last 72 hours. CBG: No results for input(s): GLUCAP in the last 168 hours. Lipid Profile: No results for input(s): CHOL, HDL, LDLCALC, TRIG, CHOLHDL, LDLDIRECT in the last 72 hours. Thyroid Function Tests: No results for input(s): TSH, T4TOTAL, FREET4, T3FREE, THYROIDAB in the last 72 hours. Anemia Panel: No results for input(s): VITAMINB12, FOLATE, FERRITIN, TIBC, IRON, RETICCTPCT in the last 72 hours. Urine analysis:    Component Value Date/Time   COLORURINE AMBER (A) 08/29/2020 2052   APPEARANCEUR CLEAR 08/29/2020 2052   LABSPEC 1.013 08/29/2020 2052   PHURINE 5.0 08/29/2020 2052   GLUCOSEU NEGATIVE 08/29/2020 2052   HGBUR NEGATIVE 08/29/2020 2052   BILIRUBINUR NEGATIVE 08/29/2020  2052   KETONESUR NEGATIVE 08/29/2020 2052   PROTEINUR NEGATIVE 08/29/2020 2052   UROBILINOGEN 1.0 06/23/2015 1513   NITRITE POSITIVE (A) 08/29/2020 2052   LEUKOCYTESUR NEGATIVE 08/29/2020 2052    Radiological Exams on Admission: CT Renal Stone Study  Result Date: 08/30/2020 CLINICAL DATA:  Flank pain, suspected kidney stones in this 24 old male with history of LEFT flank pain and gross hematuria by report. Increased white blood cell count as well. EXAM: CT ABDOMEN AND PELVIS WITHOUT CONTRAST TECHNIQUE: Multidetector CT imaging of the abdomen and pelvis was performed following the standard protocol without IV contrast. COMPARISON:  Feb 08, 2020 FINDINGS: Lower chest: Lung bases are clear. No consolidation. No pleural effusion. Hepatobiliary: Liver contour is smooth. Hypodense area along the anterior margin of the LEFT hemi liver likely focal fatty infiltration based on appearance on previous exam measuring 1.4 x 1.3 cm on today's study previously 1.9 x 1.3 cm. 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 No gross biliary duct dilation. Small amount of pneumobilia in the LEFT biliary tree. Pancreas: Post Whipple procedure. Subtle calcifications in the pancreatic head without gross dilation of the pancreatic duct or signs of pancreatic inflammation. Spleen: Normal Adrenals/Urinary Tract: Normal appearance of the adrenal glands. Urinary bladder under distended. Bilateral perinephric stranding without hydronephrosis. Areas of low-density in the RIGHT and LEFT kidney compatible with small cysts not changed grossly from previous imaging. No nephrolithiasis. No signs of ureteral calculi. Lack of intra-abdominal fat does limit assessment of distal ureters. Stomach/Bowel: Post Whipple with gastric thickening and mild adjacent stranding. No sign of bowel obstruction. The appendix is normal. Twisting of small bowel seen in the coronal plane on images 28 through 53 but without signs  of related obstruction or mesenteric injection at this time. This is in the jejunum in the LEFT hemiabdomen. Vascular/Lymphatic: Calcified atheromatous plaque of the abdominal aorta. There is no gastrohepatic or hepatoduodenal ligament lymphadenopathy. No retroperitoneal or mesenteric lymphadenopathy. No pelvic sidewall lymphadenopathy. Reproductive: Prostate unremarkable by CT. Other: No free air or ascites. Musculoskeletal: Spinal degenerative changes. No acute or destructive bone process. IMPRESSION: 1. Post Whipple procedure with  signs of gastric thickening and mild adjacent stranding. Correlate with any symptoms of gastritis. Anastomotic ulceration is also possibility though findings are nonspecific. 2. Partial twist of small-bowel loop in the LEFT hemiabdomen in the jejunum not associated with obstruction or adjacent inflammation. If there is continued abdominal pain or signs that would suggest bowel obstruction, could consider imaging with enteric contrast to exclude possibility of developing obstruction. 3. No evidence of nephrolithiasis or hydronephrosis. 4. Bilateral perinephric stranding without hydronephrosis. Findings could be seen in the setting of pyelonephritis but are nonspecific. If there is continued hematuria further workup may be needed. 5. Aortic atherosclerosis. 6. Focal fatty infiltration along the anterior LEFT hemi liver similar to previous imaging shows waxing and waning features over a series of prior studies. Aortic Atherosclerosis (ICD10-I70.0). Electronically Signed   By: Zetta Bills M.D.   On: 08/30/2020 08:12   Assessment/Plan Pyelonephritis     - admit to obs, med-surg     - rocephin, fluids, pain control     - UA negative for blood, renal stone study w/o stones; it's possible he passed one  Abdominal pain     - Hx of chronic pancreatitis     - general surgery following; right now ok to have CLD     - will await final recs from gen surg     - protonix; avoid NSAIDs  for now  N/V/D     - most likely from pyelo; treating that     - anti-emetics     - check stool studies; if negative, can add antidiarrheals  Chronic pancreatitis     - continue creon; follow  Normocytic anemia     - no evidence of bleed, follow  Hypokalemia     - replace, follow; check Mg2+  DVT prophylaxis: lovenox  Code Status: FULL  Family Communication: None at bedside  Consults called: EDP spoke with general surgery.  Status is: Observation  The patient remains OBS appropriate and will d/c before 2 midnights.  Dispo: The patient is from: Home              Anticipated d/c is to: Home              Anticipated d/c date is: 1 day              Patient currently is not medically stable to d/c.  Jonnie Finner DO [facility]  If 7PM-7AM, please contact night-coverage [email or web address]  08/30/2020, 9:36 AM

## 2020-08-30 NOTE — ED Provider Notes (Signed)
[facility] DEPT Provider Note   CSN: [id number] Arrival date & time: 08/29/20  1946     History Chief Complaint  Patient presents with  . Abdominal Pain    Brent Rogers is a 54 y.o. male.  HPI      Brent Rogers is a 54 y.o. male, with a history of chronic pancreatitis status post Whipple, GERD, presenting to the ED with left-sided flank and abdominal pain for the last 1 to 2 weeks.  Pain is fairly constant, sometimes waxing and waning, sharp, present in the left mid back, left flank, and left side of the abdomen.  Also endorses frank hematuria, dysuria, nausea, and nonbilious, nonbloody emesis. States he has not drunk alcohol in 3 weeks. Denies fever/chills, chest pain, shortness of breath, groin swelling, testicular pain, hematochezia/melena, or any other complaints.    Past Medical History:  Diagnosis Date  . Benign tumor of endocrine pancreas   . Chronic pancreatitis ([place])    S/P Whipple  . Foot ulcer with fat layer exposed ([place])   . Hypertension   . Migraine    "@ least once/month" (11/12/2015)  . Pancreatic abnormality    CT  shows mass  . Pneumonia 11/12/2015  . Scoliosis     Patient Active Problem List   Diagnosis Date Noted  . Abdominal pain 08/30/2020  . 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 Chronic pain syndrome 06/07/2019  . Depression 06/07/2019  . Chronic pancreatitis ([place]) 05/01/2017  . Foot callus 12/25/2015  . Onychomycosis of toenail 12/25/2015  . Fatty liver 11/12/2015  . GERD (gastroesophageal reflux disease) 11/12/2015  . HTN (hypertension) 07/10/2015  . S/P cholecystectomy 07/10/2015  . Tobacco abuse 12/26/2014  . Protein-calorie malnutrition, severe ([place]) 11/20/2014  . Recurrent pancreatitis 06/06/2014  . Exocrine pancreatic insufficiency ([place]) 12/23/2013    Past Surgical History:  Procedure Laterality Date  . BACK SURGERY    . EUS N/A 09/21/2013   Procedure: ESOPHAGEAL ENDOSCOPIC  ULTRASOUND (EUS) RADIAL;  Surgeon: Beryle Beams, MD;  Location: [facility] ENDOSCOPY;  Service: Endoscopy;  Laterality: N/A;  . EUS N/A 09/30/2013   Procedure: UPPER ENDOSCOPIC ULTRASOUND (EUS) LINEAR;  Surgeon: Beryle Beams, MD;  Location: [facility] ENDOSCOPY;  Service: Endoscopy;  Laterality: N/A;  . FINE NEEDLE ASPIRATION N/A 09/21/2013   Procedure: FINE NEEDLE ASPIRATION (FNA) LINEAR;  Surgeon: Beryle Beams, MD;  Location: [facility] END[facility];  Service: Endoscopy;  Laterality: N/A;  . FINGER FRACTURE SURGERY Left 1995   5th digit  . FRACTURE SURGERY    . KNEE ARTHROSCOPY Bilateral 1987-1989   right-left  . LAPAROSCOPY N/A 12/01/2013   Procedure: LAPAROSCOPY DIAGNOSTIC PANCREATICODUODENECTOMY WITH BILIARY AND PANCREATIC STENTS;  Surgeon: Stark Klein, MD;  Location: [facility] ORS;  Service: General;  Laterality: N/A;  . LUMBAR [place] SURGERY  1990's  . WHIPPLE PROCEDURE  12/01/2013       Family History  Problem Relation Age of Onset  . Cancer Mother        unsure  . COPD Mother   . Hypertension Mother   . Hypertension Father   . COPD Father   . CAD Sister        possible has stent per pt    Social History   Tobacco Use  . Smoking status: Current Every Day Smoker    Packs/day: 0.25    Years: 30.00    Pack years: 7.50    Types: Cigarettes  . Smokeless tobacco: Former Systems developer    Types: Snuff  .  Tobacco comment: "used snuff 10-15 years in my 20s-30s"  Vaping Use  . Vaping Use: Never used  Substance Use Topics  . Alcohol use: Yes    Alcohol/week: 4.0 standard drinks    Types: 4 Cans of beer per week    Comment: 05/01/16  . Drug use: No    Home Medications Prior to Admission medications   Medication Sig Start Date End Date Taking? Authorizing Provider  acetaminophen (TYLENOL) 500 MG tablet Take 1,000 mg by mouth every 6 (six) hours as needed for mild pain.   Yes [provider]  Aspirin-Acetaminophen-Caffeine (GOODY HEADACHE PO) Take 1 packet by mouth as needed (pain, headache).   Yes  [provider]  bismuth subsalicylate (PEPTO BISMOL) 262 MG/15ML suspension Take 30 mLs by mouth every 6 (six) hours as needed for indigestion or diarrhea or loose stools.   Yes [provider]  buPROPion (WELLBUTRIN SR) 100 MG 12 hr tablet Take 1 tablet (100 mg total) by mouth 2 (two) times daily. 06/22/20  Yes Fulp, Cammie, MD  dicyclomine (BENTYL) 20 MG tablet 1 pill up to 4 times per day as needed for abdominal pain Patient taking differently: Take 20 mg by mouth 4 (four) times daily as needed for spasms (abdominal pain).  06/22/20  Yes Fulp, Cammie, MD  ibuprofen (ADVIL) 200 MG tablet Take 800-1,000 mg by mouth every 6 (six) hours as needed for moderate pain.   Yes [provider]  Pancrelipase, Lip-Prot-Amyl, (CREON) 24000-76000 units CPEP Take 1 capsule (24,000 Units total) by mouth with breakfast, with lunch, and with evening meal. 06/22/20  Yes Fulp, Cammie, MD  traMADol (ULTRAM) 50 MG tablet Take 1 tablet (50 mg total) by mouth every 12 (twelve) hours as needed for moderate pain. 06/22/20  Yes Fulp, Cammie, MD  ciprofloxacin (CIPRO) 500 MG tablet Take 1 tablet (500 mg total) by mouth 2 (two) times daily. Patient not taking: Reported on 08/30/2020 08/29/20   Ladell Pier, MD  pantoprazole (PROTONIX) 40 MG tablet Take 1 tablet (40 mg total) by mouth daily. Patient not taking: Reported on 08/30/2020 08/29/20   Ladell Pier, MD  promethazine (PHENERGAN) 25 MG tablet TAKE 1 TABLET BY MOUTH EVERY 8 HOURS AS NEEDED FOR NAUSEA OR VOMITING. Patient not taking: Reported on 08/30/2020 08/29/20   Ladell Pier, MD    Allergies    Patient has no known allergies.  Review of Systems   Review of Systems  Constitutional: Negative for chills and fever.  Respiratory: Negative for cough and shortness of breath.   Gastrointestinal: Positive for abdominal pain, nausea and vomiting. Negative for blood in stool and diarrhea.  Genitourinary: Positive for dysuria, flank  pain and hematuria. Negative for difficulty urinating, discharge, scrotal swelling and testicular pain.  Musculoskeletal: Positive for back pain.  Neurological: Negative for syncope and weakness.  All other systems reviewed and are negative.   Physical Exam Updated Vital Signs BP (!) 126/95 (BP Location: Left Arm)   Pulse 78   Temp 98.7 F (37.1 C) (Oral)   Resp 14   Ht 6\' 3"  (1.905 m)   Wt 68 kg   SpO2 99%   BMI 18.75 kg/m   Physical Exam Vitals and nursing note reviewed.  Constitutional:      General: He is not in acute distress.    Appearance: He is well-developed. He is not diaphoretic.  HENT:     Head: Normocephalic and atraumatic.     Mouth/Throat:     Mouth:  Mucous membranes are moist.     Pharynx: Oropharynx is clear.  Eyes:     Conjunctiva/sclera: Conjunctivae normal.  Cardiovascular:     Rate and Rhythm: Normal rate and regular rhythm.     Pulses: Normal pulses.          Radial pulses are 2+ on the right side and 2+ on the left side.       Posterior tibial pulses are 2+ on the right side and 2+ on the left side.     Comments: Tactile temperature in the extremities appropriate and equal bilaterally. Pulmonary:     Effort: Pulmonary effort is normal. No respiratory distress.     Breath sounds: Normal breath sounds.  Abdominal:     Palpations: Abdomen is soft.     Tenderness: There is abdominal tenderness. There is left CVA tenderness. There is no guarding.    Musculoskeletal:     Cervical back: Neck supple.       Back:     Right lower leg: No edema.     Left lower leg: No edema.  Lymphadenopathy:     Cervical: No cervical adenopathy.  Skin:    General: Skin is warm and dry.  Neurological:     Mental Status: He is alert.     Comments: No noted acute cognitive deficit. Sensation grossly intact to light touch in the extremities.   Grip strengths equal bilaterally.   Strength 5/5 in all extremities.  No gait disturbance.  Coordination intact.    Cranial nerves III-XII grossly intact.  Handles oral secretions without noted difficulty.  No noted phonation or speech deficit. No facial droop.   Psychiatric:        Mood and Affect: Mood and affect normal.        Speech: Speech normal.        Behavior: Behavior normal.     ED Results / Procedures / Treatments   Labs (all labs ordered are listed, but only abnormal results are displayed) Labs Reviewed  COMPREHENSIVE METABOLIC PANEL - Abnormal; Notable for the following components:      Result Value   Potassium 3.1 (*)    Glucose, Bld 124 (*)    Total Protein 6.3 (*)    Albumin 3.4 (*)    All other components within normal limits  CBC - Abnormal; Notable for the following components:   WBC 13.1 (*)    Hemoglobin 12.2 (*)    HCT 38.0 (*)    RDW 20.2 (*)    All other components within normal limits  URINALYSIS, ROUTINE W REFLEX MICROSCOPIC - Abnormal; Notable for the following components:   Color, Urine AMBER (*)    Nitrite POSITIVE (*)    Bacteria, UA RARE (*)    All other components within normal limits  URINE CULTURE  RESP PANEL BY RT-PCR (FLU A&B, COVID) ARPGX2  LIPASE, BLOOD  GC/CHLAMYDIA PROBE AMP ([place]) NOT AT [facility]   WBC  Date Value Ref Range Status  08/29/2020 13.1 (H) 4.0 - 10.5 K/uL Final  08/26/2020 11.1 (H) 4.0 - 10.5 K/uL Final  06/22/2020 10.9 (H) 3.4 - 10.8 x10E3/uL Final  05/29/2020 14.0 (H) 4.0 - 10.5 K/uL Final  04/10/2020 11.4 (H) 4.0 - 10.5 K/uL Final   Hemoglobin  Date Value Ref Range Status  08/29/2020 12.2 (L) 13.0 - 17.0 g/dL Final  08/26/2020 12.4 (L) 13.0 - 17.0 g/dL Final  06/22/2020 11.8 (L) 13.0 - 17.7 g/dL Final  05/29/2020 10.2 (L) 13.0 -  17.0 g/dL Final  04/10/2020 11.5 (L) 13.0 - 17.0 g/dL Final     EKG None  Radiology CT Renal Stone Study  Result Date: 08/30/2020 CLINICAL DATA:  Flank pain, suspected kidney stones in this 84 old male with history of LEFT flank pain and gross hematuria by report. Increased white  blood cell count as well. EXAM: CT ABDOMEN AND PELVIS WITHOUT CONTRAST TECHNIQUE: Multidetector CT imaging of the abdomen and pelvis was performed following the standard protocol without IV contrast. COMPARISON:  Feb 08, 2020 FINDINGS: Lower chest: Lung bases are clear. No consolidation. No pleural effusion. Hepatobiliary: Liver contour is smooth. Hypodense area along the anterior margin of the LEFT hemi liver likely focal fatty infiltration based on appearance on previous exam measuring 1.4 x 1.3 cm on today's study previously 1.9 x 1.3 cm. 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 No gross biliary duct dilation. Small amount of pneumobilia in the LEFT biliary tree. Pancreas: Post Whipple procedure. Subtle calcifications in the pancreatic head without gross dilation of the pancreatic duct or signs of pancreatic inflammation. Spleen: Normal Adrenals/Urinary Tract: Normal appearance of the adrenal glands. Urinary bladder under distended. Bilateral perinephric stranding without hydronephrosis. Areas of low-density in the RIGHT and LEFT kidney compatible with small cysts not changed grossly from previous imaging. No nephrolithiasis. No signs of ureteral calculi. Lack of intra-abdominal fat does limit assessment of distal ureters. Stomach/Bowel: Post Whipple with gastric thickening and mild adjacent stranding. No sign of bowel obstruction. The appendix is normal. Twisting of small bowel seen in the coronal plane on images 28 through 53 but without signs of related obstruction or mesenteric injection at this time. This is in the jejunum in the LEFT hemiabdomen. Vascular/Lymphatic: Calcified atheromatous plaque of the abdominal aorta. There is no gastrohepatic or hepatoduodenal ligament lymphadenopathy. No retroperitoneal or mesenteric lymphadenopathy. No pelvic sidewall lymphadenopathy. Reproductive: Prostate unremarkable by CT. Other: No free air or ascites. Musculoskeletal: Spinal  degenerative changes. No acute or destructive bone process. IMPRESSION: 1. 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 Correlate with any symptoms of gastritis. Anastomotic ulceration is also possibility though findings are nonspecific. 2. Partial twist of small-bowel loop in the LEFT hemiabdomen in the jejunum not associated with obstruction or adjacent inflammation. If there is continued abdominal pain or signs that would suggest bowel obstruction, could consider imaging with enteric contrast to exclude possibility of developing obstruction. 3. No evidence of nephrolithiasis or hydronephrosis. 4. Bilateral perinephric stranding without hydronephrosis. Findings could be seen in the setting of pyelonephritis but are nonspecific. If there is continued hematuria further workup may be needed. 5. Aortic atherosclerosis. 6. Focal fatty infiltration along the anterior LEFT hemi liver similar to previous imaging shows waxing and waning features over a series of prior studies. Aortic Atherosclerosis (ICD10-I70.0). Electronically Signed   By: Zetta Bills M.D.   On: 08/30/2020 08:12    Procedures Procedures (including critical care time)  Medications Ordered in ED Medications  potassium chloride 10 mEq in 100 mL IVPB (has no administration in time range)  lactated ringers bolus 1,000 mL (1,000 mLs Intravenous New Bag/Given 08/30/20 0837)  morphine 4 MG/ML injection 4 mg (4 mg Intravenous Given 08/30/20 0748)  ondansetron (ZOFRAN) injection 4 mg (4 mg Intravenous Given 08/30/20 0748)  cefTRIAXone (ROCEPHIN) 1 g in sodium chloride 0.9 % 100 mL IVPB (0 g Intravenous Stopped 08/30/20 0837)    ED Course  I have reviewed the triage vital signs and  the nursing notes.  Pertinent labs & imaging results that were available during my care of the patient were reviewed by me and considered in my medical decision making (see chart for details).  Clinical Course as of Aug 30 1001    Thu Aug 30, 2020  2440 Spoke with Modena Jansky, [place] with General Surgery. We reviewed the patient's symptoms, surgical history, physical exam, and CT findings. Recommends admission via hospitalist.  He will speak with the attending and they will come see the patient. No additional imaging at this time.   [SJ]  1027 Spoke with Dr. Marylyn Ishihara, hospitalist.  Agrees to admit the patient.   [SJ]    Clinical Course User Index [SJ] Nevah Dalal, Helane Gunther, PA-C   MDM Rules/Calculators/A&P                           Patient presents with left-sided abdominal pain, nausea, vomiting.  He also endorses hematuria and dysuria. Patient is nontoxic appearing, afebrile, not tachycardic, not tachypneic, not hypotensive, maintains excellent SPO2 on room air, and is in no apparent distress.  Due to the patient's onset of left-sided flank, back, and abdominal pain as well as his hematuria, renal stone study was thought to be the most appropriate imaging study at the time. I have reviewed the patient's chart to obtain more information.   I reviewed and interpreted the patient's labs and radiological studies. UA without hematuria, but with rare bacteria and nitrites.  Perinephric stranding on CT.  Urine culture submitted.  Antibiotic therapy initiated. Mild hypokalemia noted and addressed.  On CT patient also has gastric thickening and stranding.  Question of anastomotic ulceration.  Also appears as though there may be twisting of the small bowel in the left abdomen.  Due to the patient's previous abdominal surgery in the area, general surgery was consulted.  Recommended for admission via hospitalist.   Findings and plan of care discussed with Antony Blackbird, MD.   Vitals:   08/30/20 0750 08/30/20 0755 08/30/20 0830 08/30/20 0933  BP: (!) 141/94  (!) 141/99 (!) 119/94  Pulse: 68  77 72  Resp: 15  (!) 26 16  Temp:  98 F (36.7 C)    TempSrc:  Oral    SpO2: 100%  95% 100%  Weight:      Height:         Final  Clinical Impression(s) / ED Diagnoses Final diagnoses:  Left sided abdominal pain    Rx / DC Orders ED Discharge Orders    None       Layla Maw 08/30/20 1002    Tegeler, Gwenyth Allegra, MD 08/30/20 1601

## 2020-08-30 NOTE — Consult Note (Signed)
[facility] Consult Note  Brent Rogers 10-Nov-1965  [id number].    Requesting MD: Marda Stalker Chief Complaint: New onset left-sided flank pain and hematuria Reason for Consult:  recurrent pancreatitis, questionable gastric anastomosis ulceration, possible small bowel twist   HPI: Patient is a 54 year old male who underwent, Pancreaticoduodenectomy with gallbladder,  portl nodes for a malignant pancreatic head mass on 12/01/2013, by Dr. Stark Klein.  He had problems with diarrhea and pancreatic insufficiency with weight loss postop.  I have talked to our office and we do not have any notes on the patient in our system after that.  He has multiple notes in epic showing that he has had chronic pancreatitis since his Whipple procedure.  He also has a history of ongoing alcohol use.  He has had multiple repeat visits to the ED with pancreatitis, at least some of them have been thought to be alcohol induced.  He returned yesterday evening with the above-noted complaints.  Work-up shows he is afebrile his blood pressure is mildly elevated.  CMP shows a sodium of 137, potassium of 3.1, glucose 124, albumin 3.4, total protein 6.3.  Remainder the CMP is normal.  Lipase 18.  WBC 13.1, H/H 12.2/38, platelets 256,000.  UA is positive for nitrates.  0-5 WBCs/RBC/hpf, urine cultures pending.  Because of his complaint of hematuria he underwent a renal stone CT study.  This shows post Whipple procedure with signs of gastric thickening and adjacent stranding, anastomotic ulceration was thought to be possible.  There is a partial twist of the small bowel loop in the left hemiabdomen in the jejunum not associated with obstruction or adjacent inflammation.  No evidence of nephrolithiasis/hydronephrosis.  Bilateral perinephric stranding with possible pyelonephritis.  Focal fatty infiltration of the left hemiliver similar to previous findings.  We are asked to see.    Pathology 12/12/2013 1. Whipple  procedure/resection, Pancreaticoduodenectomy - FINDINGS CONSISTENT WITH GROOVE PANCREATITIS. - NEGATIVE FOR EVIDENCE OF MALIGNANCY. - THREE LYMPH NODES WITH NO SIGNIFICANT PATHOLOGIC ALTERATION. - OUTSIDE CONSULTATION DIAGNOSIS. 2. Gallbladder - MILD CHRONIC CHOLECYSTITIS. 3. Lymph node, biopsy, hepatic artery - ONE BENIGN LYMPH NODE (0/1).  ROS: Review of Systems  Constitutional: Negative.   HENT: Negative.   Eyes: Negative.   Respiratory: Negative.   Gastrointestinal: Positive for abdominal pain (Pain is all in the left flank on admission exam.). Negative for blood in stool, constipation, diarrhea, heartburn, melena, nausea and vomiting.  Genitourinary: Positive for hematuria.  Musculoskeletal: Negative.   Skin: Negative.   Neurological: Negative.   Endo/Heme/Allergies: Negative.   Psychiatric/Behavioral: Negative.  Suicidal ideas:      Family History  Problem Relation Age of Onset  . Cancer Mother        unsure  . COPD Mother   . Hypertension Mother   . Hypertension Father   . COPD Father   . CAD Sister        possible has stent per pt    Past Medical History:  Diagnosis Date  . Benign tumor of endocrine pancreas   . Chronic pancreatitis ([place])    S/P Whipple  . Foot ulcer with fat layer exposed ([place])   . Hypertension   . Migraine    "@ least once/month" (11/12/2015)  . Pancreatic abnormality    CT  shows mass  . Pneumonia 11/12/2015  . Scoliosis     Past Surgical History:  Procedure Laterality Date  . BACK SURGERY    . EUS N/A 09/21/2013   Procedure: ESOPHAGEAL ENDOSCOPIC ULTRASOUND (  EUS) RADIAL;  Surgeon: Beryle Beams, MD;  Location: [facility] ENDOSCOPY;  Service: Endoscopy;  Laterality: N/A;  . EUS N/A 09/30/2013   Procedure: UPPER ENDOSCOPIC ULTRASOUND (EUS) LINEAR;  Surgeon: Beryle Beams, MD;  Location: [facility] ENDOSCOPY;  Service: Endoscopy;  Laterality: N/A;  . FINE NEEDLE ASPIRATION N/A 09/21/2013   Procedure: FINE NEEDLE ASPIRATION (FNA) LINEAR;  Surgeon:  Beryle Beams, MD;  Location: [facility] ENDOSCOPY;  Service: Endoscopy;  Laterality: N/A;  . FINGER FRACTURE SURGERY Left 1995   5th digit  . FRACTURE SURGERY    . KNEE ARTHROSCOPY Bilateral 1987-1989   right-left  . LAPAROSCOPY N/A 12/01/2013   Procedure: LAPAROSCOPY DIAGNOSTIC PANCREATICODUODENECTOMY WITH BILIARY AND PANCREATIC STENTS;  Surgeon: Stark Klein, MD;  Location: [facility] ORS;  Service: General;  Laterality: N/A;  . LUMBAR [place] SURGERY  1990's  . WHIPPLE PROCEDURE  12/01/2013    Social History:  reports that he has been smoking cigarettes. He has a 7.50 pack-year smoking history. He has quit using smokeless tobacco.  His smokeless tobacco use included snuff. He reports current alcohol use of about 4.0 standard drinks of alcohol per week. He reports that he does not use drugs.  Allergies: No Known Allergies  Prior to Admission medications   Medication Sig Start Date End Date Taking? Authorizing Provider  acetaminophen (TYLENOL) 500 MG tablet Take 1,000 mg by mouth every 6 (six) hours as needed for mild pain.    [provider]  aspirin 81 MG EC tablet Take 81 mg by mouth as needed for pain.    [provider]  Aspirin-Acetaminophen-Caffeine (GOODY HEADACHE PO) Take 1 packet by mouth as needed (pain, headache).    [provider]  bismuth subsalicylate (PEPTO BISMOL) 262 MG/15ML suspension Take 30 mLs by mouth every 6 (six) hours as needed for indigestion or diarrhea or loose stools.    [provider]  buPROPion (WELLBUTRIN SR) 100 MG 12 hr tablet Take 1 tablet (100 mg total) by mouth 2 (two) times daily. 06/22/20   Fulp, Cammie, MD  ciprofloxacin (CIPRO) 500 MG tablet Take 1 tablet (500 mg total) by mouth 2 (two) times daily. 08/29/20   Ladell Pier, MD  dicyclomine (BENTYL) 20 MG tablet 1 pill up to 4 times per day as needed for abdominal pain 06/22/20   Fulp, Cammie, MD  ibuprofen (ADVIL) 200 MG tablet Take 800-1,000 mg by mouth every 6 (six) hours as  needed for moderate pain.    [provider]  Pancrelipase, Lip-Prot-Amyl, (CREON) 24000-76000 units CPEP Take 1 capsule (24,000 Units total) by mouth with breakfast, with lunch, and with evening meal. 06/22/20   Fulp, Cammie, MD  pantoprazole (PROTONIX) 40 MG tablet Take 1 tablet (40 mg total) by mouth daily. 08/29/20   Ladell Pier, MD  promethazine (PHENERGAN) 25 MG tablet TAKE 1 TABLET BY MOUTH EVERY 8 HOURS AS NEEDED FOR NAUSEA OR VOMITING. 08/29/20   Ladell Pier, MD  traMADol (ULTRAM) 50 MG tablet Take 1 tablet (50 mg total) by mouth every 12 (twelve) hours as needed for moderate pain. 06/22/20   Fulp, Cammie, MD     Blood pressure (!) 141/94, pulse 68, temperature 98 F (36.7 C), temperature source Oral, resp. rate 15, height 6\' 3"  (1.905 m), weight 68 kg, SpO2 100 %. Physical Exam:  General: pleasant, Thin WD, WN AA male who is laying in bed in NAD HEENT: head is normocephalic, atraumatic.  Sclera are noninjected.  PERRL.  Ears and nose without  any masses or lesions.  Mouth is pink and moist Heart: regular, rate, and rhythm.  Normal s1,s2. No obvious murmurs, gallops, or rubs noted.  Palpable radial and pedal pulses bilaterally Lungs: CTAB, no wheezes, rhonchi, or rales noted.  Respiratory effort nonlabored Abd: soft, he has left flank tenderness, no distension, or abdominal tenderness.Well healed midline surgical scar.  +BS, no masses, hernias, or organomegaly MS: all 4 extremities are symmetrical with no cyanosis, clubbing, or edema. Skin: warm and dry with no masses, lesions, or rashes Neuro: Cranial nerves 2-12 grossly intact, sensation is normal throughout Psych: A&Ox3 with an appropriate affect.   Results for orders placed or performed during the hospital encounter of 08/30/20 (from the past 48 hour(s))  Lipase, blood     Status: None   Collection Time: 08/29/20  8:05 PM  Result Value Ref Range   Lipase 18 11 - 51 U/L    Comment: Performed at [facility], [place] [address] [place], [place] [address]  Comprehensive metabolic panel     Status: Abnormal   Collection Time: 08/29/20  8:05 PM  Result Value Ref Range   Sodium 137 135 - 145 mmol/L   Potassium 3.1 (L) 3.5 - 5.1 mmol/L   Chloride 101 98 - 111 mmol/L   CO2 24 22 - 32 mmol/L   Glucose, Bld 124 (H) 70 - 99 mg/dL    Comment: Glucose reference range applies only to samples taken after fasting for at least 8 hours.   BUN 7 6 - 20 mg/dL   Creatinine, Ser 1.02 0.61 - 1.24 mg/dL   Calcium 9.1 8.9 - 10.3 mg/dL   Total Protein 6.3 (L) 6.5 - 8.1 g/dL   Albumin 3.4 (L) 3.5 - 5.0 g/dL   AST 27 15 - 41 U/L   ALT 19 0 - 44 U/L   Alkaline Phosphatase 104 38 - 126 U/L   Total Bilirubin 0.6 0.3 - 1.2 mg/dL   GFR, Estimated >60 >60 mL/min    Comment: (NOTE) Calculated using the CKD-EPI Creatinine Equation (2021)    Anion gap 12 5 - 15    Comment: Performed at [facility], [place] [address], [place], [place] [address]  CBC     Status: Abnormal   Collection Time: 08/29/20  8:05 PM  Result Value Ref Range   WBC 13.1 (H) 4.0 - 10.5 K/uL   RBC 4.28 4.22 - 5.81 MIL/uL   Hemoglobin 12.2 (L) 13.0 - 17.0 g/dL   HCT 38.0 (L) 39 - 52 %   MCV 88.8 80.0 - 100.0 fL   MCH 28.5 26.0 - 34.0 pg   MCHC 32.1 30.0 - 36.0 g/dL   RDW 20.2 (H) 11.5 - 15.5 %   Platelets 256 150 - 400 K/uL   nRBC 0.0 0.0 - 0.2 %    Comment: Performed at [facility], [place] [address], [place], [place] [address]  Urinalysis, Routine w reflex microscopic     Status: Abnormal   Collection Time: 08/29/20  8:52 PM  Result Value Ref Range   Color, Urine AMBER (A) YELLOW    Comment: BIOCHEMICALS MAY BE AFFECTED BY COLOR   APPearance CLEAR CLEAR   Specific Gravity, Urine 1.013 1.005 - 1.030   pH 5.0 5.0 - 8.0   Glucose, UA NEGATIVE NEGATIVE mg/dL   Hgb urine dipstick NEGATIVE NEGATIVE   Bilirubin Urine NEGATIVE NEGATIVE   Ketones, ur NEGATIVE NEGATIVE mg/dL   Protein, ur  NEGATIVE NEGATIVE mg/dL  Nitrite POSITIVE (A) NEGATIVE   Leukocytes,Ua NEGATIVE NEGATIVE   RBC / HPF 0-5 0 - 5 RBC/hpf   WBC, UA 0-5 0 - 5 WBC/hpf   Bacteria, UA RARE (A) NONE SEEN   Squamous Epithelial / LPF 0-5 0 - 5   Mucus PRESENT     Comment: Performed at [facility], [place] [address] [place], [place] [address]   CT Renal Stone Study  Result Date: 08/30/2020 CLINICAL DATA:  Flank pain, suspected kidney stones in this 61 old male with history of LEFT flank pain and gross hematuria by report. Increased white blood cell count as well. EXAM: CT ABDOMEN AND PELVIS WITHOUT CONTRAST TECHNIQUE: Multidetector CT imaging of the abdomen and pelvis was performed following the standard protocol without IV contrast. COMPARISON:  Feb 08, 2020 FINDINGS: Lower chest: Lung bases are clear. No consolidation. No pleural effusion. Hepatobiliary: Liver contour is smooth. Hypodense area along the anterior margin of the LEFT hemi liver likely focal fatty infiltration based on appearance on previous exam measuring 1.4 x 1.3 cm on today's study previously 1.9 x 1.3 cm. 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 No gross biliary duct dilation. Small amount of pneumobilia in the LEFT biliary tree. Pancreas: Post Whipple procedure. Subtle calcifications in the pancreatic head without gross dilation of the pancreatic duct or signs of pancreatic inflammation. Spleen: Normal Adrenals/Urinary Tract: Normal appearance of the adrenal glands. Urinary bladder under distended. Bilateral perinephric stranding without hydronephrosis. Areas of low-density in the RIGHT and LEFT kidney compatible with small cysts not changed grossly from previous imaging. No nephrolithiasis. No signs of ureteral calculi. Lack of intra-abdominal fat does limit assessment of distal ureters. Stomach/Bowel: Post Whipple with gastric thickening and mild adjacent stranding. No sign of bowel obstruction. The  appendix is normal. Twisting of small bowel seen in the coronal plane on images 28 through 53 but without signs of related obstruction or mesenteric injection at this time. This is in the jejunum in the LEFT hemiabdomen. Vascular/Lymphatic: Calcified atheromatous plaque of the abdominal aorta. There is no gastrohepatic or hepatoduodenal ligament lymphadenopathy. No retroperitoneal or mesenteric lymphadenopathy. No pelvic sidewall lymphadenopathy. Reproductive: Prostate unremarkable by CT. Other: No free air or ascites. Musculoskeletal: Spinal degenerative changes. No acute or destructive bone process. IMPRESSION: 1. 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 Correlate with any symptoms of gastritis. Anastomotic ulceration is also possibility though findings are nonspecific. 2. Partial twist of small-bowel loop in the LEFT hemiabdomen in the jejunum not associated with obstruction or adjacent inflammation. If there is continued abdominal pain or signs that would suggest bowel obstruction, could consider imaging with enteric contrast to exclude possibility of developing obstruction. 3. No evidence of nephrolithiasis or hydronephrosis. 4. Bilateral perinephric stranding without hydronephrosis. Findings could be seen in the setting of pyelonephritis but are nonspecific. If there is continued hematuria further workup may be needed. 5. Aortic atherosclerosis. 6. Focal fatty infiltration along the anterior LEFT hemi liver similar to previous imaging shows waxing and waning features over a series of prior studies. Aortic Atherosclerosis (ICD10-I70.0). Electronically Signed   By: Zetta Bills M.D.   On: 08/30/2020 08:12    . [START ON 08/31/2020] cefTRIAXone (ROCEPHIN)  IV      Assessment/Plan S/P Whipple for "groove pancreatitis," 2015 Dr. Stark Klein Abnormal CT scan with question of:  -  gastritis/anastomotic ulceration  - partial twist small bowel loop left  hemiabdomen Hx Recurrent pancreatitis  -On  [place] Intermittent alcohol use    Left flank pain/hematuria Left pyelonephritis   FEN: Clear liquid diet ID: Rocephin 12/2 >> DVT: Lovenox Follow-up: We will arrange follow-up with Dr. Barry Dienes  Plan: Patient was seen and evaluated.  Dr. Hassell Done has reviewed the CT scan.  On physical exam patient is completely asymptomatic of any abdominal pain except for his left flank over the kidney.  There is Dr. Earlie Server opinion that he might have a gastric ulcer, although a marginal ulcer is what we might expect.  With a noncontrast CT he is not convinced there is any problem with the patient's small bowel, and he is completely asymptomatic on exam.  We agree with admission to medicine with treatment for pyelonephritis.  We would recommend adding PPI for the possible gastric ulcer.  We will arrange follow-up with Dr. Barry Dienes as an outpatient.  Earnstine Regal [facility] Surgery 08/30/2020, 9:00 AM Please see Amion for pager number during day hours 7:00am-4:30pm

## 2020-08-30 NOTE — ED Notes (Signed)
Pt rounding complete. Pt resting comfortably with bed in lowest position and side rails up x2. Vitals updated. All needs met at this time. Will continue to monitor.  

## 2020-08-30 NOTE — ED Notes (Signed)
Report given to Ozella Almond on 6E

## 2020-08-31 DIAGNOSIS — K861 Other chronic pancreatitis: Secondary | ICD-10-CM | POA: Diagnosis present

## 2020-08-31 DIAGNOSIS — Z825 Family history of asthma and other chronic lower respiratory diseases: Secondary | ICD-10-CM | POA: Diagnosis not present

## 2020-08-31 DIAGNOSIS — R739 Hyperglycemia, unspecified: Secondary | ICD-10-CM | POA: Diagnosis present

## 2020-08-31 DIAGNOSIS — Z809 Family history of malignant neoplasm, unspecified: Secondary | ICD-10-CM | POA: Diagnosis not present

## 2020-08-31 DIAGNOSIS — R109 Unspecified abdominal pain: Secondary | ICD-10-CM | POA: Diagnosis not present

## 2020-08-31 DIAGNOSIS — K2970 Gastritis, unspecified, without bleeding: Secondary | ICD-10-CM | POA: Diagnosis present

## 2020-08-31 DIAGNOSIS — Z20822 Contact with and (suspected) exposure to covid-19: Secondary | ICD-10-CM | POA: Diagnosis present

## 2020-08-31 DIAGNOSIS — Z90411 Acquired partial absence of pancreas: Secondary | ICD-10-CM | POA: Diagnosis not present

## 2020-08-31 DIAGNOSIS — I10 Essential (primary) hypertension: Secondary | ICD-10-CM | POA: Diagnosis present

## 2020-08-31 DIAGNOSIS — R310 Gross hematuria: Secondary | ICD-10-CM | POA: Diagnosis present

## 2020-08-31 DIAGNOSIS — E876 Hypokalemia: Secondary | ICD-10-CM | POA: Diagnosis present

## 2020-08-31 DIAGNOSIS — Z8249 Family history of ischemic heart disease and other diseases of the circulatory system: Secondary | ICD-10-CM | POA: Diagnosis not present

## 2020-08-31 DIAGNOSIS — R112 Nausea with vomiting, unspecified: Secondary | ICD-10-CM | POA: Diagnosis not present

## 2020-08-31 DIAGNOSIS — D509 Iron deficiency anemia, unspecified: Secondary | ICD-10-CM | POA: Diagnosis present

## 2020-08-31 DIAGNOSIS — D649 Anemia, unspecified: Secondary | ICD-10-CM | POA: Diagnosis not present

## 2020-08-31 DIAGNOSIS — N12 Tubulo-interstitial nephritis, not specified as acute or chronic: Secondary | ICD-10-CM | POA: Diagnosis not present

## 2020-08-31 DIAGNOSIS — F17210 Nicotine dependence, cigarettes, uncomplicated: Secondary | ICD-10-CM | POA: Diagnosis present

## 2020-08-31 DIAGNOSIS — R1012 Left upper quadrant pain: Secondary | ICD-10-CM | POA: Diagnosis not present

## 2020-08-31 DIAGNOSIS — R197 Diarrhea, unspecified: Secondary | ICD-10-CM | POA: Diagnosis present

## 2020-08-31 DIAGNOSIS — Z79899 Other long term (current) drug therapy: Secondary | ICD-10-CM | POA: Diagnosis not present

## 2020-08-31 DIAGNOSIS — K811 Chronic cholecystitis: Secondary | ICD-10-CM | POA: Diagnosis present

## 2020-08-31 LAB — URINE CULTURE: Culture: NO GROWTH

## 2020-08-31 LAB — CBC
HCT: 27.4 % — ABNORMAL LOW (ref 39.0–52.0)
Hemoglobin: 8.8 g/dL — ABNORMAL LOW (ref 13.0–17.0)
MCH: 28.9 pg (ref 26.0–34.0)
MCHC: 32.1 g/dL (ref 30.0–36.0)
MCV: 89.8 fL (ref 80.0–100.0)
Platelets: 232 10*3/uL (ref 150–400)
RBC: 3.05 MIL/uL — ABNORMAL LOW (ref 4.22–5.81)
RDW: 19.6 % — ABNORMAL HIGH (ref 11.5–15.5)
WBC: 9.4 10*3/uL (ref 4.0–10.5)
nRBC: 0 % (ref 0.0–0.2)

## 2020-08-31 LAB — PHOSPHORUS: Phosphorus: 3.7 mg/dL (ref 2.5–4.6)

## 2020-08-31 LAB — COMPREHENSIVE METABOLIC PANEL
ALT: 12 U/L (ref 0–44)
AST: 19 U/L (ref 15–41)
Albumin: 2.3 g/dL — ABNORMAL LOW (ref 3.5–5.0)
Alkaline Phosphatase: 77 U/L (ref 38–126)
Anion gap: 9 (ref 5–15)
BUN: <5 mg/dL — ABNORMAL LOW (ref 6–20)
CO2: 23 mmol/L (ref 22–32)
Calcium: 8.1 mg/dL — ABNORMAL LOW (ref 8.9–10.3)
Chloride: 104 mmol/L (ref 98–111)
Creatinine, Ser: 0.88 mg/dL (ref 0.61–1.24)
GFR, Estimated: >60 mL/min (ref >60)
Glucose, Bld: 100 mg/dL — ABNORMAL HIGH (ref 70–99)
Potassium: 3.7 mmol/L (ref 3.5–5.1)
Sodium: 136 mmol/L (ref 135–145)
Total Bilirubin: 0.3 mg/dL (ref 0.3–1.2)
Total Protein: 4.5 g/dL — ABNORMAL LOW (ref 6.5–8.1)

## 2020-08-31 LAB — HEMOGLOBIN AND HEMATOCRIT, BLOOD
HCT: 29.1 % — ABNORMAL LOW (ref 39.0–52.0)
HCT: 31 % — ABNORMAL LOW (ref 39.0–52.0)
Hemoglobin: 9.2 g/dL — ABNORMAL LOW (ref 13.0–17.0)
Hemoglobin: 9.7 g/dL — ABNORMAL LOW (ref 13.0–17.0)

## 2020-08-31 LAB — GC/CHLAMYDIA PROBE AMP (~~LOC~~) NOT AT ARMC
Chlamydia: NEGATIVE
Comment: NEGATIVE
Comment: NORMAL
Neisseria Gonorrhea: NEGATIVE

## 2020-08-31 LAB — MAGNESIUM: Magnesium: 1.7 mg/dL (ref 1.7–2.4)

## 2020-08-31 LAB — GLUCOSE, CAPILLARY: Glucose-Capillary: 99 mg/dL (ref 70–99)

## 2020-08-31 MED ORDER — MAGNESIUM SULFATE 2 GM/50ML IV SOLN
2.0000 g | Freq: Once | INTRAVENOUS | Status: AC
  Administered 2020-08-31: 2 g via INTRAVENOUS
  Filled 2020-08-31: qty 50

## 2020-08-31 NOTE — Progress Notes (Signed)
PROGRESS NOTE    ROMEO ZIELINSKI  ZOX:[id number] DOB: [birth year] DOA: 08/30/2020 PCP: Ladell Pier, MD   Brief Narrative:  HPI per Dr. Cherylann Ratel on 08/30/20 VANG KRAEGER is a 54 y.o. male with medical history significant of whipple procedure, chronic pancreatitis. Presenting with left abdomen and flank pain. Symptoms have been episodic over the last 3 weeks. Sharp pain radiating from left abdomen to left flank. He has had N/V/D during this same time. He thought with his constellation of symptoms that he was having a flare of pancreatitis or problems from his whipple surgery. So he went to the ED 5 days ago. His work was negative and he was sent home. He continued to have pain, so he spoke with his PCP. The recommendation to return to the ED was made. He wasn't going to go, but he noticed that his urine had darkened and he felt that he could have passed some blood in his urine. He became concerned and came to the ED.     ED Course: Imaging showed possible pyelonephritis (no stone) of left kidney, a possible anastomotic ulceration near his whipple procedure, and a possible obstruction of the small bowel. General surgery was called. They recommended obs admission. TRH was called for admission.   **Interim History  His blood count continued to drop so will obtain FOBT.  His abdominal pain is improving.  We'll continue to watch has blood count if it stabilizes likely can be discharged home in the a.m. and will follow up on GI pathogen panel.  Not fully convinced that this is a pyelonephritis but is currently getting empirically treated and likely can stop antibiotics tomorrow if he is improved  Assessment & Plan:   Active Problems:   Abdominal pain  Acute Left-sided flank pain associated with nausea and vomiting? Pyelonephritis, improving  -She presented with a WBC of 13.1, flank pain that radiated into the left side of his groin -CT renal stone study showed "No evidence of  nephrolithiasis or hydronephrosis. Bilateral perinephric stranding without hydronephrosis. Findings could be seen in the setting of pyelonephritis but are nonspecific. If there is continued hematuria further workup may be needed.  Aortic atherosclerosis." -Placed in observation MedSurg and will keep observation as anticipate discharging home in the a.m. -Flank pain is much improved today and patient could have passed a renal stone as it was not seen on the CT renal stone study -Urinalysis was very unimpressive and showed a clear appearance with amber color urine, negative hemoglobin, negative ketones, negative leukocytes, positive nitrites, rare bacteria, 0-5 RBCs per high-power field, 0-5 WBCs -His GC chlamydia screen was negative -Empirically started on IV antibiotics IV Rocephin will continue for now -WBC was elevated on admission at 13.1 and has now trended down to 10.4 and further trended down to 9.4 today -Patient states that he vomited quite a bit prior to coming in -Fluid hydration and this is now stopped -Continue with supportive care continue with antiemetics of Zofran 4 mg p.o./IV every 6 as needed for nausea, continue with Creon 24,000 units p.o. 3 times daily with meals, as well as pain control with IV morphine 2 mg every 4 hours as needed severe pain  Acute on Chronic normocytic anemia; question if his bleeding -Patient was likely hemoconcentrated on admission with a hemoglobin of 12.2/38.0 and was given IV fluid hydration but this is dropped the patient's hemoglobin/hematocrit 8.8/27.4 this morning could be in the setting of a dilutional drop but seems quite excessive for  dilution -check FOBT -Check anemia panel in a.m. and cycle H&H's every 8 -Continue to monitor for signs and symptoms of bleeding; currently no overt bleeding noted -Repeat CBC in the a.m.  Abdominal Pain, improved -Hx of chronic pancreatitis and patient states that this felt a little bit different in pancreatitis  when he came in and has had symptoms over the last few weeks or so -CT scan of the abdomen pelvis showed "Post Whipple procedure with signs of gastric thickening and mild adjacent stranding. Correlate with any symptoms of gastritis. Anastomotic ulceration is also possibility though findings are nonspecific. Partial twist of small-bowel loop in the LEFT hemiabdomen in the jejunum not associated with obstruction or adjacent inflammation. If there is continued abdominal pain or signs that would suggest bowel obstruction, could consider imaging with enteric contrast to exclude possibility of developing obstruction." -General surgery was consulted and was placed on a CLD but can be advanced -Neurosurgery reviewed the patient's CT scan and patient has no abdominal pain currently consistent with CT findings or evidence of any obstructive symptoms -C/w Creon 24,000 po TID before meals -Diet was advanced from a clear liquid diet to soft diet today -Continue to Monitor and C/w Dicyclomine 20 mg po 4 times DailyPRN Spasms, Abdominal Pain -General surgery has now signed off the case  N/V, Diarrhea has been Chronic -most likely from pyelo versus an acute gastritis; treating that empirically for Pyelo -C/w Antiemetics as above -Discontinued Enteric Precautions given no BM overnight and C. difficile was canceled given that he did not have a bowel movement in the last 24 hours and did not meet Criteria for C Difficile given formed stool  -Checking stool studies regardless none the less; if negative, can add antidiarrheals if needed but has had diarrhea since his Whipple; Overnight had formed hard stool balls  -GI Pathogen Panel pending   Chronic Pancreatitis -Started Soft Diet -C/w Creon 24,000 po TID before meals -Continue to Monitor and C/w Dicyclomine 20 mg po 4 times DailyPRN Spasms, Abdominal Pain  Hypokalemia -Improved. K+ went from 3.1 -> 3.7 -Mag Level was Low at 1.6 yesterday and was 1.7 and will  replete with IV Mag Sulfate 2 grams -Continue to Monitor and Replete as Necessary -Repeat CMP In the AM   Hypomagnesemia -Patient's Mag Level was 1.6 and improved to 1.7 -Replete with IV Mag Sulfate 2 grams -Continue to Monitor and Replete as Necessary -Repeat Mag Level in the AM   Hyperglycemia -Patient's Blood Sugar on Admission was 124 and repeat this AM on CMP was 100 -Check HbA1c -Continue to Monitor and Trend Blood Sugars Carefully -Repeat CBG this AM was 99 -If Necessary will start the patient on SSI  DVT prophylaxis: Enoxaparin 40 mg sq q24h but may need to hold if Hgb continues to drop Code Status: FULL CODE  Family Communication: No family present at bedside  Disposition Plan: Pending further clinical improvement, tolerance of Diet, and anticipating D/C Home in the next 24-48 hours  Status is: Observation  The patient will require care spanning > 2 midnights and should be moved to inpatient because: Unsafe d/c plan, IV treatments appropriate due to intensity of illness or inability to take PO and Inpatient level of care appropriate due to severity of illness  Dispo: The patient is from: Home              Anticipated d/c is to: Home              Anticipated d/c date  is: 1 day              Patient currently is not medically stable to d/c.  Consultants:   General Surgery   Procedures:   Antimicrobials:  Anti-infectives (From admission, onward)   Start     Dose/Rate Route Frequency Ordered Stop   08/31/20 1000  cefTRIAXone (ROCEPHIN) 2 g in sodium chloride 0.9 % 100 mL IVPB        2 g 200 mL/hr over 30 Minutes Intravenous Every 24 hours 08/30/20 1129     08/30/20 0715  cefTRIAXone (ROCEPHIN) 1 g in sodium chloride 0.9 % 100 mL IVPB        1 g 200 mL/hr over 30 Minutes Intravenous  Once 08/30/20 0714 08/30/20 0837        Subjective: Seen and examined at bedside and states is feeling better.  States that this pain that he felt yesterday was different than his  chronic pancreatitis pain and states that he knew it was different given that he continued to have nausea and vomiting.  Denies any chest pain or shortness of breath now.  Flank pain is improving.  Patient was hungry and wanted to eat so his diet has been advanced to a soft diet.  Other concerns or complaints at this time.  Objective: Vitals:   08/30/20 2025 08/31/20 0013 08/31/20 0559 08/31/20 1347  BP: 115/82 119/86 119/85 111/83  Pulse: 63 66 66 81  Resp: 20 18 20 15   Temp: 98.1 F (36.7 C)  97.9 F (36.6 C) 98.2 F (36.8 C)  TempSrc: Oral  Oral Oral  SpO2: 100% 99% 98% 100%  Weight:      Height:        Intake/Output Summary (Last 24 hours) at 08/31/2020 1451 Last data filed at 08/31/2020 1350 Gross per 24 hour  Intake 520 ml  Output 1400 ml  Net -880 ml   Filed Weights   08/29/20 1952  Weight: 68 kg   Examination: Physical Exam:  Constitutional: Thin African-American male currently in NAD and appears calm and comfortable Eyes:  Lids and conjunctivae normal, sclerae anicteric  ENMT: External Ears, Nose appear normal. Grossly normal hearing.  Neck: Appears normal, supple, no cervical masses, normal ROM, no appreciable thyromegaly: No JVD Respiratory: Diminished to auscultation bilaterally, no wheezing, rales, rhonchi or crackles. Normal respiratory effort and patient is not tachypenic. No accessory muscle use.  Cardiovascular: RRR, no murmurs / rubs / gallops. S1 and S2 auscultated. No extremity edema. 2+ pedal pulses. No carotid bruits.  Abdomen: Soft, non-tender, non-distended. Has chronic scars from his prior whipple surgery. No appreciable hepatosplenomegaly. Bowel sounds positive.  GU: Deferred. Musculoskeletal: No clubbing / cyanosis of digits/nails. No joint deformity upper and lower extremities.  Skin: No rashes, lesions, ulcers on a limited skin evaluation. No induration; Warm and dry.  Neurologic: CN 2-12 grossly intact with no focal deficits.  Romberg sign and  cerebellar reflexes not assessed.  Psychiatric: Normal judgment and insight. Alert and oriented x 3. Normal mood and appropriate affect.   Data Reviewed: I have personally reviewed following labs and imaging studies  CBC: Recent Labs  Lab 08/26/20 0104 08/29/20 2005 08/30/20 1207 08/31/20 0550  WBC 11.1* 13.1* 10.4 9.4  NEUTROABS 7.2  --   --   --   HGB 12.4* 12.2* 10.2* 8.8*  HCT 38.7* 38.0* 32.0* 27.4*  MCV 88.6 88.8 90.1 89.8  PLT 195 256 229 366   Basic Metabolic Panel: Recent Labs  Lab 08/26/20  0104 08/29/20 2005 08/30/20 1207 08/31/20 0550  NA 132* 137  --  136  K 3.9 3.1*  --  3.7  CL 97* 101  --  104  CO2 24 24  --  23  GLUCOSE 112* 124*  --  100*  BUN 5* 7  --  <5*  CREATININE 0.82 1.02 0.90 0.88  CALCIUM 9.2 9.1  --  8.1*  MG  --   --  1.6* 1.7  PHOS  --   --   --  3.7   GFR: Estimated Creatinine Clearance: 92.3 mL/min (by C-G formula based on SCr of 0.88 mg/dL). Liver Function Tests: Recent Labs  Lab 08/26/20 0104 08/29/20 2005 08/31/20 0550  AST 27 27 19   ALT 16 19 12   ALKPHOS 112 104 77  BILITOT 0.6 0.6 0.3  PROT 6.5 6.3* 4.5*  ALBUMIN 3.4* 3.4* 2.3*   Recent Labs  Lab 08/26/20 0104 08/29/20 2005  LIPASE 19 18   No results for input(s): AMMONIA in the last 168 hours. Coagulation Profile: No results for input(s): INR, PROTIME in the last 168 hours. Cardiac Enzymes: No results for input(s): CKTOTAL, CKMB, CKMBINDEX, TROPONINI in the last 168 hours. BNP (last 3 results) No results for input(s): PROBNP in the last 8760 hours. HbA1C: No results for input(s): HGBA1C in the last 72 hours. CBG: Recent Labs  Lab 08/31/20 0735  GLUCAP 99   Lipid Profile: No results for input(s): CHOL, HDL, LDLCALC, TRIG, CHOLHDL, LDLDIRECT in the last 72 hours. Thyroid Function Tests: No results for input(s): TSH, T4TOTAL, FREET4, T3FREE, THYROIDAB in the last 72 hours. Anemia Panel: No results for input(s): VITAMINB12, FOLATE, FERRITIN, TIBC, IRON,  RETICCTPCT in the last 72 hours. Sepsis Labs: No results for input(s): PROCALCITON, LATICACIDVEN in the last 168 hours.  Recent Results (from the past 240 hour(s))  Urine culture     Status: None   Collection Time: 08/29/20  8:52 PM   Specimen: Urine, Clean Catch  Result Value Ref Range Status   Specimen Description   Final    URINE, CLEAN CATCH Performed at [facility], [place] [address] [place], [place] [address]    Special Requests   Final    NONE Performed at [facility], [place] [address], [place], [place] [address]    Culture   Final    NO GROWTH Performed at [facility], [place] [address], [place], [place] [address]    Report Status 08/31/2020 FINAL  Final  Resp Panel by RT-PCR (Flu A&B, Covid) Nasopharyngeal Swab     Status: None   Collection Time: 08/30/20 10:16 AM   Specimen: Nasopharyngeal Swab; Nasopharyngeal(NP) swabs in vial transport medium  Result Value Ref Range Status   SARS Coronavirus 2 by RT PCR NEGATIVE NEGATIVE Final    Comment: (NOTE) SARS-CoV-2 target nucleic acids are NOT DETECTED.  The SARS-CoV-2 RNA is generally detectable in upper respiratory specimens during the acute phase of infection. The lowest concentration of SARS-CoV-2 viral copies this assay can detect is 138 copies/mL. A negative result does not preclude SARS-Cov-2 infection and should not be used as the sole basis for treatment or other patient management decisions. A negative result may occur with  improper specimen collection/handling, submission of specimen other than nasopharyngeal swab, presence of viral mutation(s) within the areas targeted by this assay, and inadequate number of viral copies(<138 copies/mL). A negative result must be combined with clinical observations, patient history, and epidemiological information. The expected result is  Negative.  Fact Sheet for Patients:  EntrepreneurPulse.com.au  Fact Sheet for  Healthcare Providers:  IncredibleEmployment.be  This test is no t yet approved or cleared by the [place] FDA and  has been authorized for detection and/or diagnosis of SARS-CoV-2 by FDA under an Emergency Use Authorization (EUA). This EUA will remain  in effect (meaning this test can be used) for the duration of the COVID-19 declaration under Section 564(b)(1) of the Act, 21 U.S.C.section 360bbb-3(b)(1), unless the authorization is terminated  or revoked sooner.       Influenza A by PCR NEGATIVE NEGATIVE Final   Influenza B by PCR NEGATIVE NEGATIVE Final    Comment: (NOTE) The Xpert Xpress SARS-CoV-2/FLU/RSV plus assay is intended as an aid in the diagnosis of influenza from Nasopharyngeal swab specimens and should not be used as a sole basis for treatment. Nasal washings and aspirates are unacceptable for Xpert Xpress SARS-CoV-2/FLU/RSV testing.  Fact Sheet for Patients: EntrepreneurPulse.com.au  Fact Sheet for Healthcare Providers: IncredibleEmployment.be  This test is not yet approved or cleared by the [place] FDA and has been authorized for detection and/or diagnosis of SARS-CoV-2 by FDA under an Emergency Use Authorization (EUA). This EUA will remain in effect (meaning this test can be used) for the duration of the COVID-19 declaration under Section 564(b)(1) of the Act, 21 U.S.C. section 360bbb-3(b)(1), unless the authorization is terminated or revoked.  Performed at [facility], [place] [address] [place], [place] [address]      RN Pressure Injury Documentation:     Estimated body mass index is 18.75 kg/m as calculated from the following:   Height as of this encounter: 6\' 3"  (1.905 m).   Weight as of this encounter: 68 kg.  Malnutrition Type:   Malnutrition Characteristics:   Nutrition Interventions:   Radiology Studies: CT Renal Stone Study  Result Date:  08/30/2020 CLINICAL DATA:  Flank pain, suspected kidney stones in this 12 old male with history of LEFT flank pain and gross hematuria by report. Increased white blood cell count as well. EXAM: CT ABDOMEN AND PELVIS WITHOUT CONTRAST TECHNIQUE: Multidetector CT imaging of the abdomen and pelvis was performed following the standard protocol without IV contrast. COMPARISON:  Feb 08, 2020 FINDINGS: Lower chest: Lung bases are clear. No consolidation. No pleural effusion. Hepatobiliary: Liver contour is smooth. Hypodense area along the anterior margin of the LEFT hemi liver likely focal fatty infiltration based on appearance on previous exam measuring 1.4 x 1.3 cm on today's study previously 1.9 x 1.3 cm. 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 No gross biliary duct dilation. Small amount of pneumobilia in the LEFT biliary tree. Pancreas: Post Whipple procedure. Subtle calcifications in the pancreatic head without gross dilation of the pancreatic duct or signs of pancreatic inflammation. Spleen: Normal Adrenals/Urinary Tract: Normal appearance of the adrenal glands. Urinary bladder under distended. Bilateral perinephric stranding without hydronephrosis. Areas of low-density in the RIGHT and LEFT kidney compatible with small cysts not changed grossly from previous imaging. No nephrolithiasis. No signs of ureteral calculi. Lack of intra-abdominal fat does limit assessment of distal ureters. Stomach/Bowel: Post Whipple with gastric thickening and mild adjacent stranding. No sign of bowel obstruction. The appendix is normal. Twisting of small bowel seen in the coronal plane on images 28 through 53 but without signs of related obstruction or mesenteric injection at this time. This is in the jejunum in the LEFT hemiabdomen. Vascular/Lymphatic: Calcified atheromatous plaque of the abdominal aorta. There  is no gastrohepatic or hepatoduodenal ligament lymphadenopathy. No retroperitoneal or  mesenteric lymphadenopathy. No pelvic sidewall lymphadenopathy. Reproductive: Prostate unremarkable by CT. Other: No free air or ascites. Musculoskeletal: Spinal degenerative changes. No acute or destructive bone process. IMPRESSION: 1. 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 Correlate with any symptoms of gastritis. Anastomotic ulceration is also possibility though findings are nonspecific. 2. Partial twist of small-bowel loop in the LEFT hemiabdomen in the jejunum not associated with obstruction or adjacent inflammation. If there is continued abdominal pain or signs that would suggest bowel obstruction, could consider imaging with enteric contrast to exclude possibility of developing obstruction. 3. No evidence of nephrolithiasis or hydronephrosis. 4. Bilateral perinephric stranding without hydronephrosis. Findings could be seen in the setting of pyelonephritis but are nonspecific. If there is continued hematuria further workup may be needed. 5. Aortic atherosclerosis. 6. Focal fatty infiltration along the anterior LEFT hemi liver similar to previous imaging shows waxing and waning features over a series of prior studies. Aortic Atherosclerosis (ICD10-I70.0). Electronically Signed   By: Zetta Bills M.D.   On: 08/30/2020 08:12   Scheduled Meds: . buPROPion  100 mg Oral BID  . enoxaparin (LOVENOX) injection  40 mg Subcutaneous Q24H  . lipase/protease/amylase  24,000 Units Oral TID AC  . pantoprazole  40 mg Oral Daily  . potassium chloride  40 mEq Oral Daily   Continuous Infusions: . cefTRIAXone (ROCEPHIN)  IV 2 g (08/31/20 1001)    LOS: 0 days   Kerney Elbe, DO [facility] PAGER is on [place]  If 7PM-7AM, please contact night-coverage [email or web address]

## 2020-08-31 NOTE — Progress Notes (Signed)
Subjective: No abdominal pain.  On CLD liquids apparently which he is drinking but has been eating crackers as he has an empty wrapper hanging out of his pocket.    ROS: See above, otherwise other systems negative  Objective: Vital signs in last 24 hours: Temp:  [97.5 F (36.4 C)-98.1 F (36.7 C)] 97.9 F (36.6 C) (12/03 0559) Pulse Rate:  [63-77] 66 (12/03 0559) Resp:  [13-26] 20 (12/03 0559) BP: (115-141)/(82-99) 119/85 (12/03 0559) SpO2:  [95 %-100 %] 98 % (12/03 0559) Last BM Date: 08/30/20  Intake/Output from previous day: 12/02 0701 - 12/03 0700 In: 233.3 [P.O.:70; IV Piggyback:163.3] Out: 1200 [Urine:1200] Intake/Output this shift: No intake/output data recorded.  PE: Abd: soft, NT, ND, +BS  Lab Results:  Recent Labs    08/30/20 1207 08/31/20 0550  WBC 10.4 9.4  HGB 10.2* 8.8*  HCT 32.0* 27.4*  PLT 229 232   BMET Recent Labs    08/29/20 2005 08/29/20 2005 08/30/20 1207 08/31/20 0550  NA 137  --   --  136  K 3.1*  --   --  3.7  CL 101  --   --  104  CO2 24  --   --  23  GLUCOSE 124*  --   --  100*  BUN 7  --   --  <5*  CREATININE 1.02   < > 0.90 0.88  CALCIUM 9.1  --   --  8.1*   < > = values in this interval not displayed.   PT/INR No results for input(s): LABPROT, INR in the last 72 hours. CMP     Component Value Date/Time   NA 136 08/31/2020 0550   NA 140 06/22/2020 1648   K 3.7 08/31/2020 0550   CL 104 08/31/2020 0550   CO2 23 08/31/2020 0550   GLUCOSE 100 (H) 08/31/2020 0550   BUN <5 (L) 08/31/2020 0550   BUN 4 (L) 06/22/2020 1648   CREATININE 0.88 08/31/2020 0550   CREATININE 0.91 10/15/2015 1517   CALCIUM 8.1 (L) 08/31/2020 0550   PROT 4.5 (L) 08/31/2020 0550   PROT 6.2 06/22/2020 1648   ALBUMIN 2.3 (L) 08/31/2020 0550   ALBUMIN 3.5 (L) 06/22/2020 1648   AST 19 08/31/2020 0550   ALT 12 08/31/2020 0550   ALKPHOS 77 08/31/2020 0550   BILITOT 0.3 08/31/2020 0550   BILITOT 0.2 06/22/2020 1648   GFRNONAA >60 08/31/2020  0550   GFRNONAA >89 10/15/2015 1517   GFRAA 128 06/22/2020 1648   GFRAA >89 10/15/2015 1517   Lipase     Component Value Date/Time   LIPASE 18 08/29/2020 2005       Studies/Results: CT Renal Stone Study  Result Date: 08/30/2020 CLINICAL DATA:  Flank pain, suspected kidney stones in this 70 old male with history of LEFT flank pain and gross hematuria by report. Increased white blood cell count as well. EXAM: CT ABDOMEN AND PELVIS WITHOUT CONTRAST TECHNIQUE: Multidetector CT imaging of the abdomen and pelvis was performed following the standard protocol without IV contrast. COMPARISON:  Feb 08, 2020 FINDINGS: Lower chest: Lung bases are clear. No consolidation. No pleural effusion. Hepatobiliary: Liver contour is smooth. Hypodense area along the anterior margin of the LEFT hemi liver likely focal fatty infiltration based on appearance on previous exam measuring 1.4 x 1.3 cm on today's study previously 1.9 x 1.3 cm. 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 No gross biliary duct dilation. Small  amount of pneumobilia in the LEFT biliary tree. Pancreas: Post Whipple procedure. Subtle calcifications in the pancreatic head without gross dilation of the pancreatic duct or signs of pancreatic inflammation. Spleen: Normal Adrenals/Urinary Tract: Normal appearance of the adrenal glands. Urinary bladder under distended. Bilateral perinephric stranding without hydronephrosis. Areas of low-density in the RIGHT and LEFT kidney compatible with small cysts not changed grossly from previous imaging. No nephrolithiasis. No signs of ureteral calculi. Lack of intra-abdominal fat does limit assessment of distal ureters. Stomach/Bowel: Post Whipple with gastric thickening and mild adjacent stranding. No sign of bowel obstruction. The appendix is normal. Twisting of small bowel seen in the coronal plane on images 28 through 53 but without signs of related obstruction or mesenteric  injection at this time. This is in the jejunum in the LEFT hemiabdomen. Vascular/Lymphatic: Calcified atheromatous plaque of the abdominal aorta. There is no gastrohepatic or hepatoduodenal ligament lymphadenopathy. No retroperitoneal or mesenteric lymphadenopathy. No pelvic sidewall lymphadenopathy. Reproductive: Prostate unremarkable by CT. Other: No free air or ascites. Musculoskeletal: Spinal degenerative changes. No acute or destructive bone process. IMPRESSION: 1. 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 Correlate with any symptoms of gastritis. Anastomotic ulceration is also possibility though findings are nonspecific. 2. Partial twist of small-bowel loop in the LEFT hemiabdomen in the jejunum not associated with obstruction or adjacent inflammation. If there is continued abdominal pain or signs that would suggest bowel obstruction, could consider imaging with enteric contrast to exclude possibility of developing obstruction. 3. No evidence of nephrolithiasis or hydronephrosis. 4. Bilateral perinephric stranding without hydronephrosis. Findings could be seen in the setting of pyelonephritis but are nonspecific. If there is continued hematuria further workup may be needed. 5. Aortic atherosclerosis. 6. Focal fatty infiltration along the anterior LEFT hemi liver similar to previous imaging shows waxing and waning features over a series of prior studies. Aortic Atherosclerosis (ICD10-I70.0). Electronically Signed   By: Zetta Bills M.D.   On: 08/30/2020 08:12    Anti-infectives: Anti-infectives (From admission, onward)   Start     Dose/Rate Route Frequency Ordered Stop   08/31/20 1000  cefTRIAXone (ROCEPHIN) 2 g in sodium chloride 0.9 % 100 mL IVPB        2 g 200 mL/hr over 30 Minutes Intravenous Every 24 hours 08/30/20 1129     08/30/20 0715  cefTRIAXone (ROCEPHIN) 1 g in sodium chloride 0.9 % 100 mL IVPB        1 g 200 mL/hr over 30 Minutes Intravenous   Once 08/30/20 6286 08/30/20 3817       Assessment/Plan Left pyelonephritis - per medicine  S/p whipple in 2015 -never followed up -patient is hungry and has no abdominal pain c/w CT scan findings or evidence of obstructive symptoms. -he may have a diet from our standpoint -he may follow up with Dr. Barry Dienes as needed. -we will sign off.  FEN - cLD, may adv as tolerates VTE - per medicine ID - per medicine   LOS: 0 days    Henreitta Cea , [facility] 08/31/2020, 8:22 AM Please see Amion for pager number during day hours 7:00am-4:30pm or 7:00am -11:30am on weekends

## 2020-09-01 ENCOUNTER — Other Ambulatory Visit (HOSPITAL_COMMUNITY): Payer: Self-pay | Admitting: Internal Medicine

## 2020-09-01 DIAGNOSIS — E876 Hypokalemia: Secondary | ICD-10-CM

## 2020-09-01 DIAGNOSIS — R109 Unspecified abdominal pain: Secondary | ICD-10-CM

## 2020-09-01 DIAGNOSIS — K861 Other chronic pancreatitis: Secondary | ICD-10-CM | POA: Diagnosis not present

## 2020-09-01 DIAGNOSIS — R1012 Left upper quadrant pain: Secondary | ICD-10-CM | POA: Diagnosis not present

## 2020-09-01 DIAGNOSIS — R197 Diarrhea, unspecified: Secondary | ICD-10-CM

## 2020-09-01 DIAGNOSIS — R112 Nausea with vomiting, unspecified: Secondary | ICD-10-CM

## 2020-09-01 DIAGNOSIS — D509 Iron deficiency anemia, unspecified: Secondary | ICD-10-CM

## 2020-09-01 LAB — HEMOGLOBIN AND HEMATOCRIT, BLOOD
HCT: 29.5 % — ABNORMAL LOW (ref 39.0–52.0)
Hemoglobin: 9.1 g/dL — ABNORMAL LOW (ref 13.0–17.0)

## 2020-09-01 LAB — CBC WITH DIFFERENTIAL/PLATELET
Abs Immature Granulocytes: 0.09 10*3/uL — ABNORMAL HIGH (ref 0.00–0.07)
Basophils Absolute: 0.1 10*3/uL (ref 0.0–0.1)
Basophils Relative: 1 %
Eosinophils Absolute: 0.2 10*3/uL (ref 0.0–0.5)
Eosinophils Relative: 2 %
HCT: 29.5 % — ABNORMAL LOW (ref 39.0–52.0)
Hemoglobin: 9.2 g/dL — ABNORMAL LOW (ref 13.0–17.0)
Immature Granulocytes: 1 %
Lymphocytes Relative: 24 %
Lymphs Abs: 2.3 10*3/uL (ref 0.7–4.0)
MCH: 28.6 pg (ref 26.0–34.0)
MCHC: 31.2 g/dL (ref 30.0–36.0)
MCV: 91.6 fL (ref 80.0–100.0)
Monocytes Absolute: 1.3 10*3/uL — ABNORMAL HIGH (ref 0.1–1.0)
Monocytes Relative: 14 %
Neutro Abs: 5.5 10*3/uL (ref 1.7–7.7)
Neutrophils Relative %: 58 %
Platelets: 238 10*3/uL (ref 150–400)
RBC: 3.22 MIL/uL — ABNORMAL LOW (ref 4.22–5.81)
RDW: 19.2 % — ABNORMAL HIGH (ref 11.5–15.5)
WBC: 9.4 10*3/uL (ref 4.0–10.5)
nRBC: 0 % (ref 0.0–0.2)

## 2020-09-01 LAB — COMPREHENSIVE METABOLIC PANEL
ALT: 13 U/L (ref 0–44)
AST: 16 U/L (ref 15–41)
Albumin: 2.4 g/dL — ABNORMAL LOW (ref 3.5–5.0)
Alkaline Phosphatase: 85 U/L (ref 38–126)
Anion gap: 9 (ref 5–15)
BUN: 8 mg/dL (ref 6–20)
CO2: 25 mmol/L (ref 22–32)
Calcium: 8.1 mg/dL — ABNORMAL LOW (ref 8.9–10.3)
Chloride: 101 mmol/L (ref 98–111)
Creatinine, Ser: 0.98 mg/dL (ref 0.61–1.24)
GFR, Estimated: >60 mL/min (ref >60)
Glucose, Bld: 94 mg/dL (ref 70–99)
Potassium: 4.1 mmol/L (ref 3.5–5.1)
Sodium: 135 mmol/L (ref 135–145)
Total Bilirubin: 0.1 mg/dL — ABNORMAL LOW (ref 0.3–1.2)
Total Protein: 4.8 g/dL — ABNORMAL LOW (ref 6.5–8.1)

## 2020-09-01 LAB — GASTROINTESTINAL PANEL BY PCR, STOOL (REPLACES STOOL CULTURE)

## 2020-09-01 LAB — PHOSPHORUS: Phosphorus: 3.8 mg/dL (ref 2.5–4.6)

## 2020-09-01 LAB — IRON AND TIBC
Iron: 13 ug/dL — ABNORMAL LOW (ref 45–182)
Saturation Ratios: 5 % — ABNORMAL LOW (ref 17.9–39.5)
TIBC: 241 ug/dL — ABNORMAL LOW (ref 250–450)
UIBC: 228 ug/dL

## 2020-09-01 LAB — RETICULOCYTES
Immature Retic Fract: 10.4 % (ref 2.3–15.9)
RBC.: 3.13 MIL/uL — ABNORMAL LOW (ref 4.22–5.81)
Retic Count, Absolute: 64.5 10*3/uL (ref 19.0–186.0)
Retic Ct Pct: 2.1 % (ref 0.4–3.1)

## 2020-09-01 LAB — HEMOGLOBIN A1C
Hgb A1c MFr Bld: 5.1 % (ref 4.8–5.6)
Mean Plasma Glucose: 99.67 mg/dL

## 2020-09-01 LAB — FERRITIN: Ferritin: 15 ng/mL — ABNORMAL LOW (ref 24–336)

## 2020-09-01 LAB — FOLATE: Folate: 9.2 ng/mL (ref >5.9)

## 2020-09-01 LAB — VITAMIN B12: Vitamin B-12: 339 pg/mL (ref 180–914)

## 2020-09-01 LAB — MAGNESIUM: Magnesium: 1.8 mg/dL (ref 1.7–2.4)

## 2020-09-01 MED ORDER — PANTOPRAZOLE SODIUM 40 MG PO TBEC: 40.0000 mg | DELAYED_RELEASE_TABLET | Freq: Every day | ORAL | 0 refills | Status: DC

## 2020-09-01 MED ORDER — HYDROCODONE-ACETAMINOPHEN 7.5-325 MG PO TABS: 1.0000 | ORAL_TABLET | Freq: Four times a day (QID) | ORAL | 0 refills | Status: AC | PRN

## 2020-09-01 MED ORDER — FERROUS SULFATE 325 (65 FE) MG PO TABS: 325.0000 mg | ORAL_TABLET | Freq: Every day | ORAL | 0 refills | Status: DC

## 2020-09-01 NOTE — Discharge Instructions (Signed)
Dx: Chronic pancreatitis, flank pain secondary to probable passed kidney stone, possible gastric ulcer.

## 2020-09-01 NOTE — Plan of Care (Signed)
  Problem: Education: Goal: Knowledge of General Education information will improve Description: Including pain rating scale, medication(s)/side effects and non-pharmacologic comfort measures Outcome: Adequate for Discharge   Problem: Health Behavior/Discharge Planning: Goal: Ability to manage health-related needs will improve Outcome: Adequate for Discharge   Problem: Clinical Measurements: Goal: Ability to maintain clinical measurements within normal limits will improve Outcome: Adequate for Discharge   Problem: Clinical Measurements: Goal: Will remain free from infection Outcome: Adequate for Discharge   Problem: Clinical Measurements: Goal: Diagnostic test results will improve Outcome: Adequate for Discharge   Problem: Clinical Measurements: Goal: Respiratory complications will improve Outcome: Adequate for Discharge   Problem: Clinical Measurements: Goal: Cardiovascular complication will be avoided Outcome: Adequate for Discharge   Problem: Activity: Goal: Risk for activity intolerance will decrease Outcome: Adequate for Discharge   Problem: Nutrition: Goal: Adequate nutrition will be maintained Outcome: Adequate for Discharge   Problem: Coping: Goal: Level of anxiety will decrease Outcome: Adequate for Discharge   Problem: Elimination: Goal: Will not experience complications related to bowel motility Outcome: Adequate for Discharge   Problem: Pain Managment: Goal: General experience of comfort will improve Outcome: Adequate for Discharge   Problem: Safety: Goal: Ability to remain free from injury will improve Outcome: Adequate for Discharge   Problem: Skin Integrity: Goal: Risk for impaired skin integrity will decrease Outcome: Adequate for Discharge

## 2020-09-01 NOTE — Discharge Summary (Signed)
Physician Discharge Summary  Brent Rogers YQI:[id number] DOB: 1966/03/30 DOA: 08/30/2020  PCP: Ladell Pier, MD  Admit date: 08/30/2020 Discharge date: 09/01/2020  Admitted From: Home Disposition:  Discharged to home.   Recommendations for Outpatient Follow-up:  1. Follow up with PCP as scheduled. 2. Follow up with General Surgery as needed.  Home Health: N/A  Equipment/Devices: N/A   Discharge Condition: Stable  CODE STATUS: FULL  Brief/Interim Summary: Brent Hasting Fosteris a 54 y.o.malewith medical history significant ofwhipple procedure, chronic pancreatitis. Presenting with left abdomen and flank pain. Symptoms have been episodic over the last 3 weeks. Sharp pain radiating from left abdomen to left flank. He has had N/V/D during this same time. He thought with his constellation of symptoms that he was having a flare of pancreatitis or problems from his whipple surgery. So he went to the ED 5 days ago. His work was negative and he was sent home. He continued to have pain, so he spoke with his PCP. The recommendation to return to the ED was made. He wasn't going to go, but he noticed that his urine had darkened and he felt that he could have passed some blood in his urine. He became concerned and came to the ED.  09/01/20: Denies complaints to day. Diarrhea is resolved as of yesterday. 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 No further tx necessary at this time on his diarrhea. His flank pain is resolved. Urine Cx did not grow anything. Not likely pyelonephritis. It is more likely that he passed stone prior to imaging. With regard to his abdominal pain, general surgery was consulted. Per surgery: "Dr. Earlie Server opinion that he might have a gastric ulcer, although a marginal ulcer is what we might expect.  With a noncontrast CT he is not convinced there is any problem with the patient's small bowel, and he is completely asymptomatic on exam. We would recommend adding PPI for the possible  gastric ulcer." His abdominal pain is improved and is at baseline. He no longer has any N/V. He will need to follow up with Dr. Barry Dienes as an outpatient. He will go home on a PPI. He feels comfortable with going home. He has follow up with his PCP on Monday. We recommend he maintain that follow up.   Discharge Diagnoses: Abdominal pain Flank pain N/V/D Chronic pancreatitis Iron deficiency anemia Hypokalemia  Discharge Instructions  Follow up with PCP as scheduled. Follow up with Dr. Barry Dienes as needed. Continue protonix. Start iron supplementation.   Allergies as of 09/01/2020   No Known Allergies     Medication List    STOP taking these medications   ciprofloxacin 500 MG tablet Commonly known as: Cipro   GOODY HEADACHE PO   ibuprofen 200 MG tablet Commonly known as: ADVIL     TAKE these medications   acetaminophen 500 MG tablet Commonly known as: TYLENOL Take 1,000 mg by mouth every 6 (six) hours as needed for mild pain.   bismuth subsalicylate 387 FI/43PI suspension Commonly known as: PEPTO BISMOL Take 30 mLs by mouth every 6 (six) hours as needed for indigestion or diarrhea or loose stools.   buPROPion 100 MG 12 hr tablet Commonly known as: Wellbutrin SR Take 1 tablet (100 mg total) by mouth 2 (two) times daily.   Creon 24000-76000 units Cpep Generic drug: Pancrelipase (Lip-Prot-Amyl) Take 1 capsule (24,000 Units total) by mouth with breakfast, with lunch, and with evening meal.   dicyclomine 20 MG tablet Commonly known as: BENTYL 1 pill  up to 4 times per day as needed for abdominal pain What changed:   how much to take  how to take this  when to take this  reasons to take this  additional instructions   ferrous sulfate 325 (65 FE) MG tablet Take 1 tablet (325 mg total) by mouth daily.   HYDROcodone-acetaminophen 7.5-325 MG tablet Commonly known as: Norco Take 1 tablet by mouth every 6 (six) hours as needed for up to 2 days for moderate pain.    pantoprazole 40 MG tablet Commonly known as: PROTONIX Take 1 tablet (40 mg total) by mouth daily.   promethazine 25 MG tablet Commonly known as: PHENERGAN TAKE 1 TABLET BY MOUTH EVERY 8 HOURS AS NEEDED FOR NAUSEA OR VOMITING.   traMADol 50 MG tablet Commonly known as: ULTRAM Take 1 tablet (50 mg total) by mouth every 12 (twelve) hours as needed for moderate pain.       Follow-up Information    Stark Klein, MD Follow up.   Specialty: General Surgery Why: follow up as needed Contact information: [address] [place] [address] [phone]              No Known Allergies  Consultations:  Surgery  Procedures/Studies: CT Renal Stone Study  Result Date: 08/30/2020 CLINICAL DATA:  Flank pain, suspected kidney stones in this 55 old male with history of LEFT flank pain and gross hematuria by report. Increased white blood cell count as well. EXAM: CT ABDOMEN AND PELVIS WITHOUT CONTRAST TECHNIQUE: Multidetector CT imaging of the abdomen and pelvis was performed following the standard protocol without IV contrast. COMPARISON:  Feb 08, 2020 FINDINGS: Lower chest: Lung bases are clear. No consolidation. No pleural effusion. Hepatobiliary: Liver contour is smooth. Hypodense area along the anterior margin of the LEFT hemi liver likely focal fatty infiltration based on appearance on previous exam measuring 1.4 x 1.3 cm on today's study previously 1.9 x 1.3 cm. 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 No gross biliary duct dilation. Small amount of pneumobilia in the LEFT biliary tree. Pancreas: Post Whipple procedure. Subtle calcifications in the pancreatic head without gross dilation of the pancreatic duct or signs of pancreatic inflammation. Spleen: Normal Adrenals/Urinary Tract: Normal appearance of the adrenal glands. Urinary bladder under distended. Bilateral perinephric stranding without hydronephrosis. Areas of low-density in  the RIGHT and LEFT kidney compatible with small cysts not changed grossly from previous imaging. No nephrolithiasis. No signs of ureteral calculi. Lack of intra-abdominal fat does limit assessment of distal ureters. Stomach/Bowel: Post Whipple with gastric thickening and mild adjacent stranding. No sign of bowel obstruction. The appendix is normal. Twisting of small bowel seen in the coronal plane on images 28 through 53 but without signs of related obstruction or mesenteric injection at this time. This is in the jejunum in the LEFT hemiabdomen. Vascular/Lymphatic: Calcified atheromatous plaque of the abdominal aorta. There is no gastrohepatic or hepatoduodenal ligament lymphadenopathy. No retroperitoneal or mesenteric lymphadenopathy. No pelvic sidewall lymphadenopathy. Reproductive: Prostate unremarkable by CT. Other: No free air or ascites. Musculoskeletal: Spinal degenerative changes. No acute or destructive bone process. IMPRESSION: 1. 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 Correlate with any symptoms of gastritis. Anastomotic ulceration is also possibility though findings are nonspecific. 2. Partial twist of small-bowel loop in the LEFT hemiabdomen in the jejunum not associated with obstruction or adjacent inflammation. If there is continued abdominal pain or signs that would suggest bowel  obstruction, could consider imaging with enteric contrast to exclude possibility of developing obstruction. 3. No evidence of nephrolithiasis or hydronephrosis. 4. Bilateral perinephric stranding without hydronephrosis. Findings could be seen in the setting of pyelonephritis but are nonspecific. If there is continued hematuria further workup may be needed. 5. Aortic atherosclerosis. 6. Focal fatty infiltration along the anterior LEFT hemi liver similar to previous imaging shows waxing and waning features over a series of prior studies. Aortic Atherosclerosis (ICD10-I70.0).  Electronically Signed   By: Zetta Bills M.D.   On: 08/30/2020 08:12     Subjective: "I'm doing ok, doc."  Discharge Exam: Vitals:   08/31/20 2214 09/01/20 0512  BP: 129/89 (!) 144/106  Pulse: 79 76  Resp: 18 18  Temp:    SpO2: 100% 100%   Vitals:   08/31/20 0559 08/31/20 1347 08/31/20 2214 09/01/20 0512  BP: 119/85 111/83 129/89 (!) 144/106  Pulse: 66 81 79 76  Resp: 20 15 18 18   Temp: 97.9 F (36.6 C) 98.2 F (36.8 C)    TempSrc: Oral Oral    SpO2: 98% 100% 100% 100%  Weight:      Height:        General: 54 y.o. male resting in bed in NAD Eyes: PERRL, normal sclera ENMT: Nares patent w/o discharge, orophaynx clear, dentition normal, ears w/o discharge/lesions/ulcers Neck: Supple, trachea midline Cardiovascular: RRR, +S1, S2, no m/g/r, equal pulses throughout Respiratory: CTABL, no w/r/r, normal WOB GI: BS+, NDNT, no masses noted, no organomegaly noted MSK: No e/c/c Skin: No rashes, bruises, ulcerations noted Neuro: A&O x 3, no focal deficits Psyc: Appropriate interaction and affect, calm/cooperative   The results of significant diagnostics from this hospitalization (including imaging, microbiology, ancillary and laboratory) are listed below for reference.     Microbiology: Recent Results (from the past 240 hour(s))  Urine culture     Status: None   Collection Time: 08/29/20  8:52 PM   Specimen: Urine, Clean Catch  Result Value Ref Range Status   Specimen Description   Final    URINE, CLEAN CATCH Performed at [facility], [place] [address] [place], [place] [address]    Special Requests   Final    NONE Performed at [facility], [place] [address] [place], [place] [address]    Culture   Final    NO GROWTH Performed at [facility], [place] [address] [place], [place] [address]    Report Status 08/31/2020 FINAL  Final  Resp Panel by RT-PCR (Flu A&B, Covid) Nasopharyngeal Swab     Status: None   Collection Time:  08/30/20 10:16 AM   Specimen: Nasopharyngeal Swab; Nasopharyngeal(NP) swabs in vial transport medium  Result Value Ref Range Status   SARS Coronavirus 2 by RT PCR NEGATIVE NEGATIVE Final    Comment: (NOTE) SARS-CoV-2 target nucleic acids are NOT DETECTED.  The SARS-CoV-2 RNA is generally detectable in upper respiratory specimens during the acute phase of infection. The lowest concentration of SARS-CoV-2 viral copies this assay can detect is 138 copies/mL. A negative result does not preclude SARS-Cov-2 infection and should not be used as the sole basis for treatment or other patient management decisions. A negative result may occur with  improper specimen collection/handling, submission of specimen other than nasopharyngeal swab, presence of viral mutation(s) within the areas targeted by this assay, and inadequate number of viral copies(<138 copies/mL). A negative result must be combined with clinical observations, patient history, and epidemiological information. The expected result is Negative.  Fact Sheet for Patients:  EntrepreneurPulse.com.au  Fact Sheet for Healthcare Providers:  IncredibleEmployment.be  This test is no t yet approved or cleared by the [place] FDA and  has been authorized for detection and/or diagnosis of SARS-CoV-2 by FDA under an Emergency Use Authorization (EUA). This EUA will remain  in effect (meaning this test can be used) for the duration of the COVID-19 declaration under Section 564(b)(1) of the Act, 21 U.S.C.section 360bbb-3(b)(1), unless the authorization is terminated  or revoked sooner.       Influenza A by PCR NEGATIVE NEGATIVE Final   Influenza B by PCR NEGATIVE NEGATIVE Final    Comment: (NOTE) The Xpert Xpress SARS-CoV-2/FLU/RSV plus assay is intended as an aid in the diagnosis of influenza from Nasopharyngeal swab specimens and should not be used as a sole basis for treatment. Nasal washings  and aspirates are unacceptable for Xpert Xpress SARS-CoV-2/FLU/RSV testing.  Fact Sheet for Patients: EntrepreneurPulse.com.au  Fact Sheet for Healthcare Providers: IncredibleEmployment.be  This test is not yet approved or cleared by the [place] FDA and has been authorized for detection and/or diagnosis of SARS-CoV-2 by FDA under an Emergency Use Authorization (EUA). This EUA will remain in effect (meaning this test can be used) for the duration of the COVID-19 declaration under Section 564(b)(1) of the Act, 21 U.S.C. section 360bbb-3(b)(1), unless the authorization is terminated or revoked.  Performed at [facility], [place] [address], [place], [place] [address]   Gastrointestinal Panel by PCR , Stool     Status: Abnormal   Collection Time: 08/30/20  2:39 PM   Specimen: STOOL  Result Value Ref Range Status   Campylobacter species NOT DETECTED NOT DETECTED Final   Plesimonas shigelloides NOT DETECTED NOT DETECTED Final   Salmonella species NOT DETECTED NOT DETECTED Final   Yersinia enterocolitica NOT DETECTED NOT DETECTED Final   Vibrio species NOT DETECTED NOT DETECTED Final   Vibrio cholerae NOT DETECTED NOT DETECTED Final   Enteroaggregative E coli (EAEC) DETECTED (A) NOT DETECTED Final    Comment: RESULT CALLED TO, READ BACK BY AND VERIFIED WITHArville Go CROWLEY AT 0926 09/01/20 SDR    Enteropathogenic E coli (EPEC) NOT DETECTED NOT DETECTED Final   Enterotoxigenic E coli (ETEC) NOT DETECTED NOT DETECTED Final   Shiga like toxin producing E coli (STEC) NOT DETECTED NOT DETECTED Final   Shigella/Enteroinvasive E coli (EIEC) NOT DETECTED NOT DETECTED Final   Cryptosporidium NOT DETECTED NOT DETECTED Final   Cyclospora cayetanensis NOT DETECTED NOT DETECTED Final   Entamoeba histolytica NOT DETECTED NOT DETECTED Final   Giardia lamblia NOT DETECTED NOT DETECTED Final   Adenovirus F40/41 NOT DETECTED NOT DETECTED Final    Astrovirus NOT DETECTED NOT DETECTED Final   Norovirus GI/GII NOT DETECTED NOT DETECTED Final   Rotavirus A NOT DETECTED NOT DETECTED Final   Sapovirus (I, II, IV, and V) NOT DETECTED NOT DETECTED Final    Comment: Performed at [facility], [place] [place], [place] [address]     Labs: BNP (last 3 results) No results for input(s): BNP in the last 8760 hours. Basic Metabolic Panel: Recent Labs  Lab 08/26/20 0104 08/29/20 2005 08/30/20 1207 08/31/20 0550 09/01/20 0746  NA 132* 137  --  136 135  K 3.9 3.1*  --  3.7 4.1  CL 97* 101  --  104 101  CO2 24 24  --  23 25  GLUCOSE 112* 124*  --  100* 94  BUN 5*  7  --  <5* 8  CREATININE 0.82 1.02 0.90 0.88 0.98  CALCIUM 9.2 9.1  --  8.1* 8.1*  MG  --   --  1.6* 1.7 1.8  PHOS  --   --   --  3.7 3.8   Liver Function Tests: Recent Labs  Lab 08/26/20 0104 08/29/20 2005 08/31/20 0550 09/01/20 0746  AST 27 27 19 16   ALT 16 19 12 13   ALKPHOS 112 104 77 85  BILITOT 0.6 0.6 0.3 0.1*  PROT 6.5 6.3* 4.5* 4.8*  ALBUMIN 3.4* 3.4* 2.3* 2.4*   Recent Labs  Lab 08/26/20 0104 08/29/20 2005  LIPASE 19 18   No results for input(s): AMMONIA in the last 168 hours. CBC: Recent Labs  Lab 08/26/20 0104 08/26/20 0104 08/29/20 2005 08/29/20 2005 08/30/20 1207 08/31/20 0550 08/31/20 1620 08/31/20 2329 09/01/20 0746  WBC 11.1*  --  13.1*  --  10.4 9.4  --   --  9.4  NEUTROABS 7.2  --   --   --   --   --   --   --  5.5  HGB 12.4*   < > 12.2*   < > 10.2* 8.8* 9.2* 9.7* 9.2*  9.1*  HCT 38.7*   < > 38.0*   < > 32.0* 27.4* 29.1* 31.0* 29.5*  29.5*  MCV 88.6  --  88.8  --  90.1 89.8  --   --  91.6  PLT 195  --  256  --  229 232  --   --  238   < > = values in this interval not displayed.   Cardiac Enzymes: No results for input(s): CKTOTAL, CKMB, CKMBINDEX, TROPONINI in the last 168 hours. BNP: Invalid input(s): POCBNP CBG: Recent Labs  Lab 08/31/20 0735  GLUCAP 99   D-Dimer No results for input(s): DDIMER in  the last 72 hours. Hgb A1c No results for input(s): HGBA1C in the last 72 hours. Lipid Profile No results for input(s): CHOL, HDL, LDLCALC, TRIG, CHOLHDL, LDLDIRECT in the last 72 hours. Thyroid function studies No results for input(s): TSH, T4TOTAL, T3FREE, THYROIDAB in the last 72 hours.  Invalid input(s): FREET3 Anemia work up Recent Labs    09/01/20 0746  VITAMINB12 339  FOLATE 9.2  FERRITIN 15*  TIBC 241*  IRON 13*  RETICCTPCT 2.1   Urinalysis    Component Value Date/Time   COLORURINE AMBER (A) 08/29/2020 2052   APPEARANCEUR CLEAR 08/29/2020 2052   LABSPEC 1.013 08/29/2020 2052   PHURINE 5.0 08/29/2020 2052   GLUCOSEU NEGATIVE 08/29/2020 2052   HGBUR NEGATIVE 08/29/2020 2052   BILIRUBINUR NEGATIVE 08/29/2020 2052   KETONESUR NEGATIVE 08/29/2020 2052   PROTEINUR NEGATIVE 08/29/2020 2052   UROBILINOGEN 1.0 06/23/2015 1513   NITRITE POSITIVE (A) 08/29/2020 2052   LEUKOCYTESUR NEGATIVE 08/29/2020 2052   Sepsis Labs Invalid input(s): PROCALCITONIN,  WBC,  LACTICIDVEN Microbiology Recent Results (from the past 240 hour(s))  Urine culture     Status: None   Collection Time: 08/29/20  8:52 PM   Specimen: Urine, Clean Catch  Result Value Ref Range Status   Specimen Description   Final    URINE, CLEAN CATCH Performed at [facility], [place] [address] [place], [place] [address]    Special Requests   Final    NONE Performed at [facility], [place] [address], [place], [place] [address]    Culture   Final    NO GROWTH Performed at [facility], [address], [place], [place] [address]    Report Status 08/31/2020 FINAL  Final  Resp Panel by RT-PCR (Flu A&B, Covid) Nasopharyngeal Swab     Status: None   Collection Time: 08/30/20 10:16 AM   Specimen: Nasopharyngeal Swab; Nasopharyngeal(NP) swabs in vial transport medium  Result Value Ref Range Status   SARS Coronavirus 2 by RT PCR NEGATIVE NEGATIVE Final    Comment:  (NOTE) SARS-CoV-2 target nucleic acids are NOT DETECTED.  The SARS-CoV-2 RNA is generally detectable in upper respiratory specimens during the acute phase of infection. The lowest concentration of SARS-CoV-2 viral copies this assay can detect is 138 copies/mL. A negative result does not preclude SARS-Cov-2 infection and should not be used as the sole basis for treatment or other patient management decisions. A negative result may occur with  improper specimen collection/handling, submission of specimen other than nasopharyngeal swab, presence of viral mutation(s) within the areas targeted by this assay, and inadequate number of viral copies(<138 copies/mL). A negative result must be combined with clinical observations, patient history, and epidemiological information. The expected result is Negative.  Fact Sheet for Patients:  EntrepreneurPulse.com.au  Fact Sheet for Healthcare Providers:  IncredibleEmployment.be  This test is no t yet approved or cleared by the [place] FDA and  has been authorized for detection and/or diagnosis of SARS-CoV-2 by FDA under an Emergency Use Authorization (EUA). This EUA will remain  in effect (meaning this test can be used) for the duration of the COVID-19 declaration under Section 564(b)(1) of the Act, 21 U.S.C.section 360bbb-3(b)(1), unless the authorization is terminated  or revoked sooner.       Influenza A by PCR NEGATIVE NEGATIVE Final   Influenza B by PCR NEGATIVE NEGATIVE Final    Comment: (NOTE) The Xpert Xpress SARS-CoV-2/FLU/RSV plus assay is intended as an aid in the diagnosis of influenza from Nasopharyngeal swab specimens and should not be used as a sole basis for treatment. Nasal washings and aspirates are unacceptable for Xpert Xpress SARS-CoV-2/FLU/RSV testing.  Fact Sheet for Patients: EntrepreneurPulse.com.au  Fact Sheet for Healthcare  Providers: IncredibleEmployment.be  This test is not yet approved or cleared by the [place] FDA and has been authorized for detection and/or diagnosis of SARS-CoV-2 by FDA under an Emergency Use Authorization (EUA). This EUA will remain in effect (meaning this test can be used) for the duration of the COVID-19 declaration under Section 564(b)(1) of the Act, 21 U.S.C. section 360bbb-3(b)(1), unless the authorization is terminated or revoked.  Performed at [facility], [place] [address] [place], [place] [address]   Gastrointestinal Panel by PCR , Stool     Status: Abnormal   Collection Time: 08/30/20  2:39 PM   Specimen: STOOL  Result Value Ref Range Status   Campylobacter species NOT DETECTED NOT DETECTED Final   Plesimonas shigelloides NOT DETECTED NOT DETECTED Final   Salmonella species NOT DETECTED NOT DETECTED Final   Yersinia enterocolitica NOT DETECTED NOT DETECTED Final   Vibrio species NOT DETECTED NOT DETECTED Final   Vibrio cholerae NOT DETECTED NOT DETECTED Final   Enteroaggregative E coli (EAEC) DETECTED (A) NOT DETECTED Final    Comment: RESULT CALLED TO, READ BACK BY AND VERIFIED WITHArville Go CROWLEY AT 5621 09/01/20 SDR    Enteropathogenic E coli (EPEC) NOT DETECTED NOT DETECTED Final   Enterotoxigenic E coli (ETEC) NOT DETECTED NOT DETECTED Final   Shiga like toxin producing E coli (STEC) NOT DETECTED NOT DETECTED Final   Shigella/Enteroinvasive E  coli (EIEC) NOT DETECTED NOT DETECTED Final   Cryptosporidium NOT DETECTED NOT DETECTED Final   Cyclospora cayetanensis NOT DETECTED NOT DETECTED Final   Entamoeba histolytica NOT DETECTED NOT DETECTED Final   Giardia lamblia NOT DETECTED NOT DETECTED Final   Adenovirus F40/41 NOT DETECTED NOT DETECTED Final   Astrovirus NOT DETECTED NOT DETECTED Final   Norovirus GI/GII NOT DETECTED NOT DETECTED Final   Rotavirus A NOT DETECTED NOT DETECTED Final   Sapovirus (I, II, IV, and V)  NOT DETECTED NOT DETECTED Final    Comment: Performed at [facility], [address] [place], [place] [address]     Time coordinating discharge: 45 minutes  SIGNED:   Jonnie Finner, DO  [facility] 09/01/2020, 10:59 AM   If 7PM-7AM, please contact night-coverage [email or web address]

## 2020-09-01 NOTE — Progress Notes (Signed)
AVS reviewed and questions answered.  Patient alert and oriented, skin warm and dry.  Transported to main lobby and care transferred to family/friend without incident.  

## 2020-09-03 ENCOUNTER — Ambulatory Visit: Payer: Medicaid Other | Attending: Internal Medicine | Admitting: Pharmacist

## 2020-09-03 ENCOUNTER — Telehealth: Payer: Self-pay

## 2020-09-03 ENCOUNTER — Encounter: Payer: Self-pay | Admitting: Pharmacist

## 2020-09-03 ENCOUNTER — Other Ambulatory Visit: Payer: Self-pay

## 2020-09-03 VITALS — BP 119/84

## 2020-09-03 DIAGNOSIS — I10 Essential (primary) hypertension: Secondary | ICD-10-CM

## 2020-09-03 NOTE — Progress Notes (Signed)
   S:    Patient arrives in good spirits. Presents to the clinic for hypertension evaluation, counseling, and management. Patient was referred and last seen by Primary Care Provider on 06/22/2020. I saw him last on 08/06/2020 and BP was good.   Today, he reports improvement in fatigue and lightheadedness.  Denies chest pain, HA. Denies LE edema.   Medication adherence: currently holding his amlodipine. None since I last saw him.   Current BP Medications include:  Amlodipine 5 mg (hodling)  Dietary habits include: tries to limit salt in foods; denies drinking excess caffeine  Exercise habits include: reports that he is active  Family / Social history:  FHx: HTN, COPD, CAD Tobacco: current some day smoker  Alcohol: reports occasional use  O:  Vitals:   09/03/20 1520  BP: 119/84   Home BP readings: none   Last 3 Office BP readings: BP Readings from Last 3 Encounters:  09/03/20 119/84  09/01/20 (!) 144/106  08/26/20 (!) 135/99    BMET    Component Value Date/Time   NA 135 09/01/2020 0746   NA 140 06/22/2020 1648   K 4.1 09/01/2020 0746   CL 101 09/01/2020 0746   CO2 25 09/01/2020 0746   GLUCOSE 94 09/01/2020 0746   BUN 8 09/01/2020 0746   BUN 4 (L) 06/22/2020 1648   CREATININE 0.98 09/01/2020 0746   CREATININE 0.91 10/15/2015 1517   CALCIUM 8.1 (L) 09/01/2020 0746   GFRNONAA >60 09/01/2020 0746   GFRNONAA >89 10/15/2015 1517   GFRAA 128 06/22/2020 1648   GFRAA >89 10/15/2015 1517    Renal function: Estimated Creatinine Clearance: 82.9 mL/min (by C-G formula based on SCr of 0.98 mg/dL).  Clinical ASCVD: No  The ASCVD Risk score Mikey Bussing DC Jr., et al., 2013) failed to calculate for the following reasons:   Cannot find a previous HDL lab   Cannot find a previous total cholesterol lab   A/P: Hypertension longstanding currently at goal. BP Goal = < 130/80 mmHg. BP continues to be okay off amlodipine. Bps in th ED and hospital were elevated but likely secondary to pain.  Emphasized to pt that we will continue to monitor. If BP is elevated in the future, he knows we may have to restart amlodipine.  -Discontinue amlodipine.  -F/u labs ordered - none -Counseled on lifestyle modifications for blood pressure control including reduced dietary sodium, increased exercise, adequate sleep.  Results reviewed and written information provided.   Total time in face-to-face counseling 15 minutes.   F/U Clinic Visit with PCP.   Benard Halsted, PharmD, [place] [phone]

## 2020-09-03 NOTE — Telephone Encounter (Signed)
Transition Care Management Follow-up Telephone Call  Date of discharge and from where: 09/01/2020, [facility]  How have you been since you were released from the hospital? He stated he is feeling okay, meet with  Lurena Joiner, [facility] today for BP check.   Any questions or concerns? No  Items Reviewed:  Did the pt receive and understand the discharge instructions provided? Yes   Medications obtained and verified? Yes , no questions about med regime.   Other? No   Any new allergies since your discharge? No   Do you have support at home? lives alone  Home Care and Equipment/Supplies: Were home health services ordered? no If so, what is the name of the agency? n/a Has the agency set up a time to come to the patient's home? n/a Were any new equipment or medical supplies ordered?  No What is the name of the medical supply agency? n/a Were you able to get the supplies/equipment? n/a Do you have any questions related to the use of the equipment or supplies? No, n/a  Functional Questionnaire: (I = Independent and D = Dependent) ADLs: independent  Follow up appointments reviewed:   PCP Hospital f/u appt confirmed? Yes , he wanted to be seen after the first of the year.  appt - 10/02/2020.   [facility] f/u appt confirmed? No    Are transportation arrangements needed? No   If their condition worsens, is the pt aware to call PCP or go to the Emergency Dept.?  yes  Was the patient provided with contact information for the PCP's office or ED? He has the phone number for the clinic  Was to pt encouraged to call back with questions or concerns? yes

## 2020-10-02 ENCOUNTER — Ambulatory Visit: Payer: Medicaid Other | Admitting: Internal Medicine

## 2020-10-26 ENCOUNTER — Ambulatory Visit: Payer: Medicaid Other | Admitting: Internal Medicine

## 2020-11-10 ENCOUNTER — Other Ambulatory Visit: Payer: Self-pay

## 2020-11-10 ENCOUNTER — Emergency Department (HOSPITAL_COMMUNITY): Payer: Medicaid Other

## 2020-11-10 ENCOUNTER — Encounter (HOSPITAL_COMMUNITY): Payer: Self-pay

## 2020-11-10 ENCOUNTER — Emergency Department (HOSPITAL_COMMUNITY)
Admission: EM | Admit: 2020-11-10 | Discharge: 2020-11-10 | Disposition: A | Discharge status: Home / Self Care | Payer: Medicaid Other | Attending: Emergency Medicine | Admitting: Emergency Medicine

## 2020-11-10 DIAGNOSIS — R079 Chest pain, unspecified: Secondary | ICD-10-CM | POA: Insufficient documentation

## 2020-11-10 DIAGNOSIS — I10 Essential (primary) hypertension: Secondary | ICD-10-CM | POA: Diagnosis not present

## 2020-11-10 DIAGNOSIS — K529 Noninfective gastroenteritis and colitis, unspecified: Secondary | ICD-10-CM

## 2020-11-10 DIAGNOSIS — F17210 Nicotine dependence, cigarettes, uncomplicated: Secondary | ICD-10-CM | POA: Diagnosis not present

## 2020-11-10 DIAGNOSIS — G8929 Other chronic pain: Secondary | ICD-10-CM

## 2020-11-10 DIAGNOSIS — Z859 Personal history of malignant neoplasm, unspecified: Secondary | ICD-10-CM | POA: Diagnosis not present

## 2020-11-10 DIAGNOSIS — R1012 Left upper quadrant pain: Secondary | ICD-10-CM | POA: Diagnosis present

## 2020-11-10 DIAGNOSIS — K219 Gastro-esophageal reflux disease without esophagitis: Secondary | ICD-10-CM | POA: Insufficient documentation

## 2020-11-10 DIAGNOSIS — R109 Unspecified abdominal pain: Secondary | ICD-10-CM

## 2020-11-10 HISTORY — DX: Malignant (primary) neoplasm, unspecified: C80.1

## 2020-11-10 LAB — CBC WITH DIFFERENTIAL/PLATELET
Abs Immature Granulocytes: 0.08 10*3/uL — ABNORMAL HIGH (ref 0.00–0.07)
Basophils Absolute: 0.1 10*3/uL (ref 0.0–0.1)
Basophils Relative: 1 %
Eosinophils Absolute: 0.1 10*3/uL (ref 0.0–0.5)
Eosinophils Relative: 0 %
HCT: 34.4 % — ABNORMAL LOW (ref 39.0–52.0)
Hemoglobin: 11 g/dL — ABNORMAL LOW (ref 13.0–17.0)
Immature Granulocytes: 1 %
Lymphocytes Relative: 21 %
Lymphs Abs: 2.3 10*3/uL (ref 0.7–4.0)
MCH: 28 pg (ref 26.0–34.0)
MCHC: 32 g/dL (ref 30.0–36.0)
MCV: 87.5 fL (ref 80.0–100.0)
Monocytes Absolute: 1 10*3/uL (ref 0.1–1.0)
Monocytes Relative: 9 %
Neutro Abs: 7.5 10*3/uL (ref 1.7–7.7)
Neutrophils Relative %: 68 %
Platelets: 389 10*3/uL (ref 150–400)
RBC: 3.93 MIL/uL — ABNORMAL LOW (ref 4.22–5.81)
RDW: 18.2 % — ABNORMAL HIGH (ref 11.5–15.5)
WBC: 11.1 10*3/uL — ABNORMAL HIGH (ref 4.0–10.5)
nRBC: 0 % (ref 0.0–0.2)

## 2020-11-10 LAB — COMPREHENSIVE METABOLIC PANEL
ALT: 16 U/L (ref 0–44)
AST: 33 U/L (ref 15–41)
Albumin: 3.3 g/dL — ABNORMAL LOW (ref 3.5–5.0)
Alkaline Phosphatase: 117 U/L (ref 38–126)
Anion gap: 10 (ref 5–15)
BUN: 7 mg/dL (ref 6–20)
CO2: 23 mmol/L (ref 22–32)
Calcium: 8.8 mg/dL — ABNORMAL LOW (ref 8.9–10.3)
Chloride: 102 mmol/L (ref 98–111)
Creatinine, Ser: 0.74 mg/dL (ref 0.61–1.24)
GFR, Estimated: >60 mL/min (ref >60)
Glucose, Bld: 114 mg/dL — ABNORMAL HIGH (ref 70–99)
Potassium: 3.7 mmol/L (ref 3.5–5.1)
Sodium: 135 mmol/L (ref 135–145)
Total Bilirubin: 0.6 mg/dL (ref 0.3–1.2)
Total Protein: 6.8 g/dL (ref 6.5–8.1)

## 2020-11-10 LAB — TROPONIN I (HIGH SENSITIVITY)
Troponin I (High Sensitivity): 4 ng/L (ref <18)
Troponin I (High Sensitivity): 4 ng/L (ref <18)

## 2020-11-10 LAB — URINALYSIS, ROUTINE W REFLEX MICROSCOPIC
Bilirubin Urine: NEGATIVE
Glucose, UA: NEGATIVE mg/dL
Hgb urine dipstick: NEGATIVE
Ketones, ur: NEGATIVE mg/dL
Leukocytes,Ua: NEGATIVE
Nitrite: NEGATIVE
Protein, ur: NEGATIVE mg/dL
Specific Gravity, Urine: 1.027 (ref 1.005–1.030)
pH: 6 (ref 5.0–8.0)

## 2020-11-10 LAB — POC OCCULT BLOOD, ED: Fecal Occult Bld: NEGATIVE

## 2020-11-10 LAB — LIPASE, BLOOD: Lipase: 18 U/L (ref 11–51)

## 2020-11-10 MED ORDER — HYDROMORPHONE HCL 1 MG/ML IJ SOLN
1.0000 mg | Freq: Once | INTRAMUSCULAR | Status: AC
  Administered 2020-11-10: 1 mg via INTRAVENOUS
  Filled 2020-11-10: qty 1

## 2020-11-10 MED ORDER — HYDROCODONE-ACETAMINOPHEN 5-325 MG PO TABS: 1.0000 | ORAL_TABLET | ORAL | 0 refills | Status: DC | PRN

## 2020-11-10 MED ORDER — SODIUM CHLORIDE 0.9 % IV BOLUS
1000.0000 mL | Freq: Once | INTRAVENOUS | Status: AC
  Administered 2020-11-10: 1000 mL via INTRAVENOUS

## 2020-11-10 MED ORDER — ONDANSETRON 4 MG PO TBDP: 4.0000 mg | ORAL_TABLET | Freq: Three times a day (TID) | ORAL | 0 refills | Status: DC | PRN

## 2020-11-10 MED ORDER — MORPHINE SULFATE (PF) 4 MG/ML IV SOLN
4.0000 mg | Freq: Once | INTRAVENOUS | Status: AC
  Administered 2020-11-10: 4 mg via INTRAVENOUS
  Filled 2020-11-10: qty 1

## 2020-11-10 MED ORDER — ONDANSETRON HCL 4 MG/2ML IJ SOLN
4.0000 mg | Freq: Once | INTRAMUSCULAR | Status: AC
  Administered 2020-11-10: 4 mg via INTRAVENOUS
  Filled 2020-11-10: qty 2

## 2020-11-10 MED ORDER — IOHEXOL 300 MG/ML  SOLN
100.0000 mL | Freq: Once | INTRAMUSCULAR | Status: AC | PRN
  Administered 2020-11-10: 100 mL via INTRAVENOUS

## 2020-11-10 NOTE — ED Notes (Signed)
Brought pt sandwich & drink

## 2020-11-10 NOTE — ED Triage Notes (Signed)
Pt arrived via walk in, c/o diffuse abd pain, n/v since monday. States hx of pancreatitis. Denies any sick contacts.

## 2020-11-10 NOTE — ED Provider Notes (Signed)
[place] DEPT Provider Note   CSN: [id number] Arrival date & time: 11/10/20  0626    History Chief Complaint  Patient presents with  . Vomiting  . Abdominal Pain    Brent Rogers is a 55 y.o. male  with PMHx significant for chronic pancreatitis s/p whipple procedure in 2015 who presents to ED for abdominal pain.   States that since Monday he has had abdominal pain, N/V, chest pain. He describes abdominal pain as LUQ/LLQ that is 10/10 and radiates to his left back. Describes it as constant, sharp pain. He has tried tramadol without relief. He states he initially tried to increase PO intake and use phenergan for his associated nausea and vomiting without relief. Since, he has had decreased PO intake, vomiting and diarrhea. States there have been some "red flecks" in his stool intermittently over the past week but denies frank blood in the toilet. Feels bloated. Denies objective fevers, but having "hot and cold spells" at home. No recent illnesses. Endorses chest pain without associated shortness of breath or palpitations. Feels like his CP started after the emesis. Has not exherional or pleuritic CP. Does not radiate into back, left arm, jaw. No associated diaphoresis. Denies urinary symptoms. Continues to take medications as prescribed including his Creon. Rates his pain a 8/10. Denies additional aggravating or alleviating factors.  History obtained from patient and past medical records. No interpretor was used.  HPI     Past Medical History:  Diagnosis Date  . Benign tumor of endocrine pancreas   . Cancer ([place])   . Chronic pancreatitis ([place])    S/P Whipple  . Foot ulcer with fat layer exposed ([place])   . Hypertension   . Migraine    "@ least once/month" (11/12/2015)  . Pancreatic abnormality    CT  shows mass  . Pneumonia 11/12/2015  . Scoliosis     Patient Active Problem List   Diagnosis Date Noted  . Flank pain 08/31/2020  . Abdominal pain  08/30/2020  . 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 Chronic pain syndrome 06/07/2019  . Depression 06/07/2019  . Chronic pancreatitis ([place]) 05/01/2017  . Foot callus 12/25/2015  . Onychomycosis of toenail 12/25/2015  . Fatty liver 11/12/2015  . GERD (gastroesophageal reflux disease) 11/12/2015  . HTN (hypertension) 07/10/2015  . S/P cholecystectomy 07/10/2015  . Tobacco abuse 12/26/2014  . Protein-calorie malnutrition, severe ([place]) 11/20/2014  . Recurrent pancreatitis 06/06/2014  . Exocrine pancreatic insufficiency ([place]) 12/23/2013    Past Surgical History:  Procedure Laterality Date  . BACK SURGERY    . EUS N/A 09/21/2013   Procedure: ESOPHAGEAL ENDOSCOPIC ULTRASOUND (EUS) RADIAL;  Surgeon: Beryle Beams, MD;  Location: [facility] ENDOSCOPY;  Service: Endoscopy;  Laterality: N/A;  . EUS N/A 09/30/2013   Procedure: UPPER ENDOSCOPIC ULTRASOUND (EUS) LINEAR;  Surgeon: Beryle Beams, MD;  Location: [facility] ENDOSCOPY;  Service: Endoscopy;  Laterality: N/A;  . FINE NEEDLE ASPIRATION N/A 09/21/2013   Procedure: FINE NEEDLE ASPIRATION (FNA) LINEAR;  Surgeon: Beryle Beams, MD;  Location: [facility] ENDOSCOPY;  Service: Endoscopy;  Laterality: N/A;  . FINGER FRACTURE SURGERY Left 1995   5th digit  . FRACTURE SURGERY    . KNEE ARTHROSCOPY Bilateral 1987-1989   right-left  . LAPAROSCOPY N/A 12/01/2013   Procedure: LAPAROSCOPY DIAGNOSTIC PANCREATICODUODENECTOMY WITH BILIARY AND PANCREATIC STENTS;  Surgeon: Stark Klein, MD;  Location: [facility] ORS;  Service: General;  Laterality: N/A;  . LUMBAR [place] SURGERY  1990's  . WHIPPLE PROCEDURE  12/01/2013       Family History  Problem Relation Age of Onset  . Cancer Mother        unsure  . COPD Mother   . Hypertension Mother   . Hypertension Father   . COPD Father   . CAD Sister        possible has stent per pt    Social History   Tobacco Use  . Smoking status: Current Every Day Smoker    Packs/day: 0.25    Years: 30.00    Pack years: 7.50     Types: Cigarettes  . Smokeless tobacco: Former Systems developer    Types: Snuff  . Tobacco comment: "used snuff 10-15 years in my 20s-30s"  Vaping Use  . Vaping Use: Never used  Substance Use Topics  . Alcohol use: Yes    Alcohol/week: 4.0 standard drinks    Types: 4 Cans of beer per week    Comment: 05/01/16  . Drug use: No    Home Medications Prior to Admission medications   Medication Sig Start Date End Date Taking? Authorizing Provider  HYDROcodone-acetaminophen (NORCO/VICODIN) 5-325 MG tablet Take 1 tablet by mouth every 4 (four) hours as needed. 11/10/20  Yes Flynn Gwyn A, PA-C  ondansetron (ZOFRAN ODT) 4 MG disintegrating tablet Take 1 tablet (4 mg total) by mouth every 8 (eight) hours as needed for nausea or vomiting. 11/10/20  Yes Chasty Randal A, PA-C  acetaminophen (TYLENOL) 500 MG tablet Take 1,000 mg by mouth every 6 (six) hours as needed for mild pain.    [provider]  bismuth subsalicylate (PEPTO BISMOL) 262 MG/15ML suspension Take 30 mLs by mouth every 6 (six) hours as needed for indigestion or diarrhea or loose stools.    [provider]  buPROPion (WELLBUTRIN SR) 100 MG 12 hr tablet Take 1 tablet (100 mg total) by mouth 2 (two) times daily. 06/22/20   Fulp, Cammie, MD  dicyclomine (BENTYL) 20 MG tablet 1 pill up to 4 times per day as needed for abdominal pain Patient taking differently: Take 20 mg by mouth 4 (four) times daily as needed for spasms (abdominal pain).  06/22/20   Fulp, Cammie, MD  ferrous sulfate 325 (65 FE) MG tablet Take 1 tablet (325 mg total) by mouth daily. 09/01/20 10/01/20  Cherylann Ratel A, DO  Pancrelipase, Lip-Prot-Amyl, (CREON) 24000-76000 units CPEP Take 1 capsule (24,000 Units total) by mouth with breakfast, with lunch, and with evening meal. 06/22/20   Fulp, Cammie, MD  pantoprazole (PROTONIX) 40 MG tablet Take 1 tablet (40 mg total) by mouth daily. 09/01/20   Cherylann Ratel A, DO  promethazine (PHENERGAN) 25 MG tablet TAKE 1 TABLET BY  MOUTH EVERY 8 HOURS AS NEEDED FOR NAUSEA OR VOMITING. Patient not taking: Reported on 08/30/2020 08/29/20   Ladell Pier, MD  traMADol (ULTRAM) 50 MG tablet Take 1 tablet (50 mg total) by mouth every 12 (twelve) hours as needed for moderate pain. 06/22/20   Antony Blackbird, MD    Allergies    Patient has no known allergies.  Review of Systems   Review of Systems  Constitutional: Positive for appetite change and fatigue.  HENT: Negative.   Eyes: Negative.   Respiratory: Negative.   Cardiovascular: Positive for chest pain (With emesis, non at rest, non exhertional).  Gastrointestinal: Positive for abdominal pain, blood in stool (Red flakes), diarrhea, nausea and vomiting.  Genitourinary: Negative.   Musculoskeletal: Negative.   Skin: Negative.   Neurological: Negative.   All  other systems reviewed and are negative.   Physical Exam Updated Vital Signs BP (!) 140/94   Pulse 66   Temp 98.4 F (36.9 C) (Oral)   Resp 10   SpO2 98%   Physical Exam Vitals and nursing note reviewed.  Constitutional:      General: He is not in acute distress.    Appearance: He is well-developed and well-nourished. He is not ill-appearing, toxic-appearing or diaphoretic.  HENT:     Head: Normocephalic and atraumatic.     Mouth/Throat:     Mouth: Mucous membranes are moist.     Pharynx: Oropharynx is clear.  Eyes:     Pupils: Pupils are equal, round, and reactive to light.  Cardiovascular:     Rate and Rhythm: Normal rate and regular rhythm.     Pulses: Normal pulses.          Radial pulses are 2+ on the right side and 2+ on the left side.       Dorsalis pedis pulses are 2+ on the right side and 2+ on the left side.     Heart sounds: Normal heart sounds.  Pulmonary:     Effort: Pulmonary effort is normal. No respiratory distress.     Breath sounds: Normal breath sounds.     Comments: Speaks in full sentences without difficulty. Clear to auscultation Abdominal:     General: Bowel sounds are  normal. There is no distension.     Palpations: Abdomen is soft.     Tenderness: There is abdominal tenderness in the epigastric area, left upper quadrant and left lower quadrant. There is guarding. There is no right CVA tenderness, left CVA tenderness or rebound. Negative signs include Murphy's sign and McBurney's sign.     Hernia: No hernia is present.    Musculoskeletal:        General: Normal range of motion.     Cervical back: Normal range of motion and neck supple.     Comments: Compartment soft.  No bony tenderness.  Moves all 4 extremities without difficulty.  Skin:    General: Skin is warm and dry.     Capillary Refill: Capillary refill takes less than 2 seconds.     Comments: No edema, erythema or warmth.  No fluctuance induration  Neurological:     General: No focal deficit present.     Mental Status: He is alert.     Cranial Nerves: Cranial nerves are intact.     Sensory: Sensation is intact.     Coordination: Coordination is intact.     Gait: Gait is intact.     Comments: Cranial nerves II though grossly intact Ambulatory without difficulty.  Psychiatric:        Mood and Affect: Mood and affect normal.    ED Results / Procedures / Treatments   Labs (all labs ordered are listed, but only abnormal results are displayed) Labs Reviewed  CBC WITH DIFFERENTIAL/PLATELET - Abnormal; Notable for the following components:      Result Value   WBC 11.1 (*)    RBC 3.93 (*)    Hemoglobin 11.0 (*)    HCT 34.4 (*)    RDW 18.2 (*)    Abs Immature Granulocytes 0.08 (*)    All other components within normal limits  COMPREHENSIVE METABOLIC PANEL - Abnormal; Notable for the following components:   Glucose, Bld 114 (*)    Calcium 8.8 (*)    Albumin 3.3 (*)    All other components  within normal limits  LIPASE, BLOOD  URINALYSIS, ROUTINE W REFLEX MICROSCOPIC  POC OCCULT BLOOD, ED  TROPONIN I (HIGH SENSITIVITY)  TROPONIN I (HIGH SENSITIVITY)    EKG None  Radiology CT  Abdomen Pelvis W Contrast  Result Date: 11/10/2020 CLINICAL DATA:  Lower abdominal pain with nausea and vomiting. History of Whipple procedure EXAM: CT ABDOMEN AND PELVIS WITH CONTRAST TECHNIQUE: Multidetector CT imaging of the abdomen and pelvis was performed using the standard protocol following bolus administration of intravenous contrast. CONTRAST:  171mL OMNIPAQUE IOHEXOL 300 MG/ML  SOLN COMPARISON:  August 30, 2020 FINDINGS: Lower chest: Lung bases are clear. Hepatobiliary: There is diffuse hepatic steatosis. There is a stable area of decreased attenuation near the junction of the left and right lobes of the liver measuring 1.9 x 1.4 cm, likely more severe focal fatty infiltration. No other liver lesions are evident. The gallbladder is absent. There is intrahepatic biliary duct dilatation, likely due to previous Whipple procedure. This finding was present on previous examination. There is no evident biliary duct dilatation. Pancreas: Status post partial pancreatectomy consistent with Whipple procedure. Occasional pancreatic calcifications are stable, likely reflecting a degree of chronic pancreatitis. No pancreatic mass or inflammatory focus is appreciable. No pancreatic duct dilatation evident. Spleen: No splenic lesions evident. Adrenals/Urinary Tract: Adrenals bilaterally appear unremarkable. There is a cyst in the posterior mid left kidney measuring 1 x 1 cm. There is a cyst in the medial right kidney measuring 0.8 x 0.8 cm. There is no evident hydronephrosis on either side. Extrarenal pelvis noted on the left, an anatomic variant. There is no renal or ureteral calculus on either side. Urinary bladder is midline with wall thickness within normal limits. Stomach/Bowel: Postoperative change in the stomach is noted without significant wall thickening or fluid. Evidence of previous Whipple procedure with clips in the upper abdomen, stable. There is no appreciable bowel wall thickening. Most small bowel  loops are fluid-filled. There is moderate stool in the colon. No evident bowel obstruction. The terminal ileum appears normal. The twisting appearance of mid jejunum seen in the left mid abdomen on the prior CT is not evident currently. No findings suggestive of potential internal hernia evident on this examination. There is no evident free air or portal venous air. Vascular/Lymphatic: There is no abdominal aortic aneurysm. There is aortic and iliac artery atherosclerosis. Major venous structures appear patent. There is no evident adenopathy in the abdomen or pelvis. Reproductive: Prostate and seminal vesicles are normal in size and configuration. Other: There is no periappendiceal region inflammatory change. No abscess or ascites evident in the abdomen or pelvis. Musculoskeletal: There are foci of degenerative change in the lumbar spine. There are no blastic or lytic bone lesions. No intramuscular lesions. There is stable calcification lateral to the proximal femur, benign in appearance. IMPRESSION: 1. Status post Whipple procedure without complicating feature evident. Surgical clips in the stomach region again noted without wall thickening or fluid. 2. Most small bowel loops are fluid filled suggesting potential enteritis. No evident bowel obstruction. No abscess in the abdomen or pelvis. 3. Air within the biliary ductal system is consistent with a previous Whipple procedure. 4.  Hepatic steatosis, unchanged. 5.  No evident renal or ureteral calculus.  No hydronephrosis. 6.  Aortic Atherosclerosis (ICD10-I70.0). Electronically Signed   By: Lowella Grip III M.D.   On: 11/10/2020 11:43    Procedures Procedures   Medications Ordered in ED Medications  sodium chloride 0.9 % bolus 1,000 mL (0 mLs Intravenous Stopped 11/10/20  1217)  morphine 4 MG/ML injection 4 mg (4 mg Intravenous Given 11/10/20 1011)  ondansetron (ZOFRAN) injection 4 mg (4 mg Intravenous Given 11/10/20 1010)  iohexol (OMNIPAQUE) 300 MG/ML  solution 100 mL (100 mLs Intravenous Contrast Given 11/10/20 1107)  HYDROmorphone (DILAUDID) injection 1 mg (1 mg Intravenous Given 11/10/20 1218)  HYDROmorphone (DILAUDID) injection 1 mg (1 mg Intravenous Given 11/10/20 1329)    ED Course  I have reviewed the triage vital signs and the nursing notes.  Pertinent labs & imaging results that were available during my care of the patient were reviewed by me and considered in my medical decision making (see chart for details).  55 year old, history of chronic abdominal pain, recurrent pancreatitis s/p Whipple who presents for evaluation abdominal pain.  He is afebrile, nonseptic, non-ill-appearing.  Pain consistent with his prior dose of abdominal pain.  His heart and lungs are clear.  His abdomen is diffusely tender to epigastric, left upper and lower quadrant.  Does admit to some "flecks" of dark blood in his stool.  No gross blood on exam.  He is not anticoagulated.  Does admit to some lower chest pain however not exertional, nonpleuritic in nature.  No diaphoresis.  Exam and history likely consistent with some chest wall pain is likely possible esophagitis has not consistent with ACS, PE.  No midline pulsatile abdominal mass, neurovascularly intact, normotensive.  Low suspicion for AAA, dissection.  Plan on labs, imaging and reassess.  Labs and imaging personally reviewed and interpreted:  CBC leukocytosis at 11.1 CMP glucose 114 no additional electrolyte, renal or liver abnormality Trop 4 Occult negative Lipase 18 EKG no ischemic changes CT AP with potential enteritis  Patient reassessed. Pain continued to abdomen. NO CP, SOB. No emesis since Zofran. Discussed labs and imaging. Will given additional pain control and reassess.  Reassessed.  Tolerating p.o. intake.  Pain controlled.  Discussed CT scan potential enteritis.  Possible also chronic degree of chronic abdominal pain which he has.  Will DC home with symptomatic management.  He will  return for any worsening symptoms.  Patient is nontoxic, nonseptic appearing, in no apparent distress.  Patient's pain and other symptoms adequately managed in emergency department.  Fluid bolus given.  Labs, imaging and vitals reviewed.  Patient does not meet the SIRS or Sepsis criteria.  On repeat exam patient does not have a surgical abdomin and there are no peritoneal signs.  No indication of appendicitis, bowel obstruction, bowel perforation, cholecystitis, diverticulitis, AAA, dissection, ACS, PE.    The patient has been appropriately medically screened and/or stabilized in the ED. I have low suspicion for any other emergent medical condition which would require further screening, evaluation or treatment in the ED or require inpatient management.  Patient is hemodynamically stable and in no acute distress.  Patient able to ambulate in department prior to ED.  Evaluation does not show acute pathology that would require ongoing or additional emergent interventions while in the emergency department or further inpatient treatment.  I have discussed the diagnosis with the patient and answered all questions.  Pain is been managed while in the emergency department and patient has no further complaints prior to discharge.  Patient is comfortable with plan discussed in room and is stable for discharge at this time.  I have discussed strict return precautions for returning to the emergency department.  Patient was encouraged to follow-up with PCP/specialist refer to at discharge.    MDM Rules/Calculators/A&P  Final Clinical Impression(s) / ED Diagnoses Final diagnoses:  Enteritis  Chronic abdominal pain    Rx / DC Orders ED Discharge Orders         Ordered    HYDROcodone-acetaminophen (NORCO/VICODIN) 5-325 MG tablet  Every 4 hours PRN        11/10/20 1352    ondansetron (ZOFRAN ODT) 4 MG disintegrating tablet  Every 8 hours PRN        11/10/20 1352            Annetta Deiss A, PA-C 11/10/20 1354    Pattricia Boss, MD 11/10/20 [phone]

## 2020-11-10 NOTE — Medical Student Note (Cosign Needed)
[facility] DEPT Provider Student Note For educational purposes for Medical, PA and NP students only and not part of the legal medical record.   CSN: [id number] Arrival date & time: 11/10/20  2778      History   Chief Complaint Chief Complaint  Patient presents with  . Vomiting  . Abdominal Pain    HPI Brent Rogers is a 55 y.o. male with PMHx significant for chronic pancreatitis s/p whipple procedure in 2015 who presents to ED for abdominal pain.   States that since Monday he has had abdominal pain, N/V, chest pain. He describes abdominal pain as LUQ/LLQ that is 10/10 and radiates to his left back. Describes it as constant, sharp pain. He has tried tramadol without relief. He states he initially tried to increase PO intake and use phenergan for his associated nausea and vomiting without relief. Since, he has had decreased PO intake, vomiting and diarrhea. States there have been some "red flecks" in his stool intermittently over the past week but denies frank blood in the toilet. Feels bloated. Denies objective fevers, but having "hot and cold spells" at home. No recent illnesses. Endorses chest pain without associated shortness of breath or palpitations. Denies urinary symptoms. Continues to take medications as prescribed including his Creon.  Past Medical History:  Diagnosis Date  . Benign tumor of endocrine pancreas   . Chronic pancreatitis ([place])    S/P Whipple  . Foot ulcer with fat layer exposed ([place])   . Hypertension   . Migraine    "@ least once/month" (11/12/2015)  . Pancreatic abnormality    CT  shows mass  . Pneumonia 11/12/2015  . Scoliosis     Patient Active Problem List   Diagnosis Date Noted  . Flank pain 08/31/2020  . Abdominal pain 08/30/2020  . 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 Chronic pain syndrome 06/07/2019  . Depression 06/07/2019  . Chronic pancreatitis ([place]) 05/01/2017  . Foot callus 12/25/2015  . Onychomycosis of toenail  12/25/2015  . Fatty liver 11/12/2015  . GERD (gastroesophageal reflux disease) 11/12/2015  . HTN (hypertension) 07/10/2015  . S/P cholecystectomy 07/10/2015  . Tobacco abuse 12/26/2014  . Protein-calorie malnutrition, severe ([place]) 11/20/2014  . Recurrent pancreatitis 06/06/2014  . Exocrine pancreatic insufficiency ([place]) 12/23/2013    Past Surgical History:  Procedure Laterality Date  . BACK SURGERY    . EUS N/A 09/21/2013   Procedure: ESOPHAGEAL ENDOSCOPIC ULTRASOUND (EUS) RADIAL;  Surgeon: Beryle Beams, MD;  Location: [facility] ENDOSCOPY;  Service: Endoscopy;  Laterality: N/A;  . EUS N/A 09/30/2013   Procedure: UPPER ENDOSCOPIC ULTRASOUND (EUS) LINEAR;  Surgeon: Beryle Beams, MD;  Location: [facility] ENDOSCOPY;  Service: Endoscopy;  Laterality: N/A;  . FINE NEEDLE ASPIRATION N/A 09/21/2013   Procedure: FINE NEEDLE ASPIRATION (FNA) LINEAR;  Surgeon: Beryle Beams, MD;  Location: [facility] ENDOSCOPY;  Service: Endoscopy;  Laterality: N/A;  . FINGER FRACTURE SURGERY Left 1995   5th digit  . FRACTURE SURGERY    . KNEE ARTHROSCOPY Bilateral 1987-1989   right-left  . LAPAROSCOPY N/A 12/01/2013   Procedure: LAPAROSCOPY DIAGNOSTIC PANCREATICODUODENECTOMY WITH BILIARY AND PANCREATIC STENTS;  Surgeon: Stark Klein, MD;  Location: [facility] ORS;  Service: General;  Laterality: N/A;  . LUMBAR [place] SURGERY  1990's  . WHIPPLE PROCEDURE  12/01/2013       Home Medications    Prior to Admission medications   Medication Sig Start Date End Date Taking? Authorizing Provider  acetaminophen (TYLENOL) 500 MG tablet Take 1,000 mg  by mouth every 6 (six) hours as needed for mild pain.    [provider]  bismuth subsalicylate (PEPTO BISMOL) 262 MG/15ML suspension Take 30 mLs by mouth every 6 (six) hours as needed for indigestion or diarrhea or loose stools.    [provider]  buPROPion (WELLBUTRIN SR) 100 MG 12 hr tablet Take 1 tablet (100 mg total) by mouth 2 (two) times daily. 06/22/20   Fulp, Cammie, MD   dicyclomine (BENTYL) 20 MG tablet 1 pill up to 4 times per day as needed for abdominal pain Patient taking differently: Take 20 mg by mouth 4 (four) times daily as needed for spasms (abdominal pain).  06/22/20   Fulp, Cammie, MD  ferrous sulfate 325 (65 FE) MG tablet Take 1 tablet (325 mg total) by mouth daily. 09/01/20 10/01/20  Cherylann Ratel A, DO  Pancrelipase, Lip-Prot-Amyl, (CREON) 24000-76000 units CPEP Take 1 capsule (24,000 Units total) by mouth with breakfast, with lunch, and with evening meal. 06/22/20   Fulp, Cammie, MD  pantoprazole (PROTONIX) 40 MG tablet Take 1 tablet (40 mg total) by mouth daily. 09/01/20   Cherylann Ratel A, DO  promethazine (PHENERGAN) 25 MG tablet TAKE 1 TABLET BY MOUTH EVERY 8 HOURS AS NEEDED FOR NAUSEA OR VOMITING. Patient not taking: Reported on 08/30/2020 08/29/20   Ladell Pier, MD  traMADol (ULTRAM) 50 MG tablet Take 1 tablet (50 mg total) by mouth every 12 (twelve) hours as needed for moderate pain. 06/22/20   Antony Blackbird, MD    Family History Family History  Problem Relation Age of Onset  . Cancer Mother        unsure  . COPD Mother   . Hypertension Mother   . Hypertension Father   . COPD Father   . CAD Sister        possible has stent per pt    Social History Social History   Tobacco Use  . Smoking status: Current Every Day Smoker    Packs/day: 0.25    Years: 30.00    Pack years: 7.50    Types: Cigarettes  . Smokeless tobacco: Former Systems developer    Types: Snuff  . Tobacco comment: "used snuff 10-15 years in my 20s-30s"  Vaping Use  . Vaping Use: Never used  Substance Use Topics  . Alcohol use: Yes    Alcohol/week: 4.0 standard drinks    Types: 4 Cans of beer per week    Comment: 05/01/16  . Drug use: No     Allergies   Patient has no known allergies.   Review of Systems Review of Systems  Constitutional: Positive for appetite change and chills. Negative for fever.  HENT: Negative.   Eyes: Negative.   Respiratory: Negative.   Negative for shortness of breath.   Cardiovascular: Positive for chest pain. Negative for palpitations.  Gastrointestinal: Positive for abdominal distention, abdominal pain, diarrhea, nausea and vomiting.  Endocrine: Negative.   Genitourinary: Positive for frequency. Negative for difficulty urinating.  Musculoskeletal: Negative.   Skin: Negative.   Allergic/Immunologic: Negative.   Neurological: Positive for weakness. Negative for light-headedness.  Hematological: Negative.   Psychiatric/Behavioral: Negative.    Physical Exam Updated Vital Signs BP (!) 151/105 (BP Location: Left Arm)   Pulse 91   Temp 98.4 F (36.9 C) (Oral)   Resp 16   SpO2 100%   Physical Exam Vitals and nursing note reviewed. Exam conducted with a chaperone present.  Constitutional:      General: He is not in acute  distress.    Appearance: He is well-developed. He is not toxic-appearing.  HENT:     Head: Normocephalic.     Mouth/Throat:     Mouth: Mucous membranes are moist.     Pharynx: Oropharynx is clear.  Eyes:     Extraocular Movements: Extraocular movements intact.     Conjunctiva/sclera: Conjunctivae normal.  Cardiovascular:     Rate and Rhythm: Normal rate and regular rhythm.     Heart sounds: Normal heart sounds.  Pulmonary:     Effort: Pulmonary effort is normal.     Breath sounds: Normal breath sounds.  Abdominal:     General: Abdomen is flat. A surgical scar is present. Bowel sounds are normal. There is no distension.     Palpations: Abdomen is soft.     Tenderness: There is abdominal tenderness in the left upper quadrant and left lower quadrant.  Genitourinary:    Rectum: No anal fissure, external hemorrhoid or internal hemorrhoid.  Skin:    General: Skin is warm and dry.     Capillary Refill: Capillary refill takes less than 2 seconds.  Neurological:     General: No focal deficit present.     Mental Status: He is alert.  Psychiatric:        Mood and Affect: Mood normal.    ED  Treatments / Results  Labs (all labs ordered are listed, but only abnormal results are displayed) Labs Reviewed  CBC WITH DIFFERENTIAL/PLATELET - Abnormal; Notable for the following components:      Result Value   WBC 11.1 (*)    RBC 3.93 (*)    Hemoglobin 11.0 (*)    HCT 34.4 (*)    RDW 18.2 (*)    Abs Immature Granulocytes 0.08 (*)    All other components within normal limits  COMPREHENSIVE METABOLIC PANEL - Abnormal; Notable for the following components:   Glucose, Bld 114 (*)    Calcium 8.8 (*)    Albumin 3.3 (*)    All other components within normal limits  LIPASE, BLOOD  POC OCCULT BLOOD, ED  TROPONIN I (HIGH SENSITIVITY)  TROPONIN I (HIGH SENSITIVITY)    EKG  Radiology CT Abdomen Pelvis W Contrast  Result Date: 11/10/2020 CLINICAL DATA:  Lower abdominal pain with nausea and vomiting. History of Whipple procedure EXAM: CT ABDOMEN AND PELVIS WITH CONTRAST TECHNIQUE: Multidetector CT imaging of the abdomen and pelvis was performed using the standard protocol following bolus administration of intravenous contrast. CONTRAST:  133mL OMNIPAQUE IOHEXOL 300 MG/ML  SOLN COMPARISON:  August 30, 2020 FINDINGS: Lower chest: Lung bases are clear. Hepatobiliary: There is diffuse hepatic steatosis. There is a stable area of decreased attenuation near the junction of the left and right lobes of the liver measuring 1.9 x 1.4 cm, likely more severe focal fatty infiltration. No other liver lesions are evident. The gallbladder is absent. There is intrahepatic biliary duct dilatation, likely due to previous Whipple procedure. This finding was present on previous examination. There is no evident biliary duct dilatation. Pancreas: Status post partial pancreatectomy consistent with Whipple procedure. Occasional pancreatic calcifications are stable, likely reflecting a degree of chronic pancreatitis. No pancreatic mass or inflammatory focus is appreciable. No pancreatic duct dilatation evident. Spleen: No  splenic lesions evident. Adrenals/Urinary Tract: Adrenals bilaterally appear unremarkable. There is a cyst in the posterior mid left kidney measuring 1 x 1 cm. There is a cyst in the medial right kidney measuring 0.8 x 0.8 cm. There is no evident hydronephrosis on either  side. Extrarenal pelvis noted on the left, an anatomic variant. There is no renal or ureteral calculus on either side. Urinary bladder is midline with wall thickness within normal limits. Stomach/Bowel: Postoperative change in the stomach is noted without significant wall thickening or fluid. Evidence of previous Whipple procedure with clips in the upper abdomen, stable. There is no appreciable bowel wall thickening. Most small bowel loops are fluid-filled. There is moderate stool in the colon. No evident bowel obstruction. The terminal ileum appears normal. The twisting appearance of mid jejunum seen in the left mid abdomen on the prior CT is not evident currently. No findings suggestive of potential internal hernia evident on this examination. There is no evident free air or portal venous air. Vascular/Lymphatic: There is no abdominal aortic aneurysm. There is aortic and iliac artery atherosclerosis. Major venous structures appear patent. There is no evident adenopathy in the abdomen or pelvis. Reproductive: Prostate and seminal vesicles are normal in size and configuration. Other: There is no periappendiceal region inflammatory change. No abscess or ascites evident in the abdomen or pelvis. Musculoskeletal: There are foci of degenerative change in the lumbar spine. There are no blastic or lytic bone lesions. No intramuscular lesions. There is stable calcification lateral to the proximal femur, benign in appearance. IMPRESSION: 1. Status post Whipple procedure without complicating feature evident. Surgical clips in the stomach region again noted without wall thickening or fluid. 2. Most small bowel loops are fluid filled suggesting potential  enteritis. No evident bowel obstruction. No abscess in the abdomen or pelvis. 3. Air within the biliary ductal system is consistent with a previous Whipple procedure. 4.  Hepatic steatosis, unchanged. 5.  No evident renal or ureteral calculus.  No hydronephrosis. 6.  Aortic Atherosclerosis (ICD10-I70.0). Electronically Signed   By: Lowella Grip III M.D.   On: 11/10/2020 11:43    Procedures Procedures (including critical care time)  Medications Ordered in ED Medications  sodium chloride 0.9 % bolus 1,000 mL (1,000 mLs Intravenous New Bag/Given 11/10/20 1009)  morphine 4 MG/ML injection 4 mg (4 mg Intravenous Given 11/10/20 1011)  ondansetron (ZOFRAN) injection 4 mg (4 mg Intravenous Given 11/10/20 1010)  iohexol (OMNIPAQUE) 300 MG/ML solution 100 mL (100 mLs Intravenous Contrast Given 11/10/20 1107)     Initial Impression / Assessment and Plan / ED Course  I have reviewed the triage vital signs and the nursing notes.  Pertinent labs & imaging results that were available during my care of the patient were reviewed by me and considered in my medical decision making (see chart for details).  Morgan Rennert is a 33yoM with PMH as listed above presenting for abdominal pain. Differential diagnosis at this time includes pancreatitis, gastroenteritis, dissection. Unlikely dissection given hemodynamic stability, no pulsatile mass on exam, pulses equal. CT w/ contrast of abdomen obtained. Do not believe this is gastroenteritis. He has been afebrile, not tachycardic. Believe all GI symptoms coming from pancreatic flair.   EKG - sinus rhythm CT abdomen with contrast: no acute pancreatitis. Chronic pancreatitis changes.  CBC: WBC 11 likely reactive.  FOBT: negative Lipase wnl Troponin x2 negative  CMP unremarkable  Will give IVF for likely dehydration, pain and nausea medication.   Final Clinical Impressions(s) / ED Diagnoses   Final diagnoses:  None    New Prescriptions New Prescriptions    No medications on file

## 2020-11-10 NOTE — Discharge Instructions (Addendum)
Follow up with Primary care provider  Return for new or worsening symptoms

## 2020-11-12 ENCOUNTER — Telehealth: Payer: Self-pay

## 2020-11-12 NOTE — Telephone Encounter (Signed)
Transition Care Management Unsuccessful Follow-up Telephone Call  Date of discharge and from where:  11/10/20 from [facility]  Attempts:  1st Attempt  Reason for unsuccessful TCM follow-up call:  Left voice message

## 2020-11-13 NOTE — Telephone Encounter (Signed)
Transition Care Management Unsuccessful Follow-up Telephone Call  Date of discharge and from where:  11/10/20 from [facility]  Attempts:  2nd Attempt  Reason for unsuccessful TCM follow-up call:  Left voice message

## 2020-11-14 NOTE — Telephone Encounter (Signed)
Transition Care Management Unsuccessful Follow-up Telephone Call  Date of discharge and from where:  11/10/2020 form [facility]  Attempts:  3rd Attempt  Reason for unsuccessful TCM follow-up call:  Left voice message

## 2020-11-15 ENCOUNTER — Ambulatory Visit: Payer: Medicaid Other | Admitting: Internal Medicine

## 2020-11-19 ENCOUNTER — Ambulatory Visit: Payer: Medicaid Other | Admitting: Internal Medicine

## 2020-12-06 ENCOUNTER — Encounter (HOSPITAL_COMMUNITY): Payer: Self-pay

## 2020-12-06 ENCOUNTER — Emergency Department (HOSPITAL_COMMUNITY)
Admission: EM | Admit: 2020-12-06 | Discharge: 2020-12-07 | Disposition: A | Discharge status: Home / Self Care | Payer: Medicaid Other | Attending: Emergency Medicine | Admitting: Emergency Medicine

## 2020-12-06 ENCOUNTER — Other Ambulatory Visit: Payer: Self-pay

## 2020-12-06 DIAGNOSIS — R197 Diarrhea, unspecified: Secondary | ICD-10-CM | POA: Insufficient documentation

## 2020-12-06 DIAGNOSIS — Z8719 Personal history of other diseases of the digestive system: Secondary | ICD-10-CM | POA: Insufficient documentation

## 2020-12-06 DIAGNOSIS — I10 Essential (primary) hypertension: Secondary | ICD-10-CM | POA: Diagnosis not present

## 2020-12-06 DIAGNOSIS — R112 Nausea with vomiting, unspecified: Secondary | ICD-10-CM | POA: Insufficient documentation

## 2020-12-06 DIAGNOSIS — F17210 Nicotine dependence, cigarettes, uncomplicated: Secondary | ICD-10-CM | POA: Insufficient documentation

## 2020-12-06 DIAGNOSIS — Z5321 Procedure and treatment not carried out due to patient leaving prior to being seen by health care provider: Secondary | ICD-10-CM | POA: Insufficient documentation

## 2020-12-06 DIAGNOSIS — Z86012 Personal history of benign carcinoid tumor: Secondary | ICD-10-CM | POA: Insufficient documentation

## 2020-12-06 DIAGNOSIS — R1012 Left upper quadrant pain: Secondary | ICD-10-CM | POA: Diagnosis present

## 2020-12-06 LAB — COMPREHENSIVE METABOLIC PANEL
ALT: 14 U/L (ref 0–44)
AST: 30 U/L (ref 15–41)
Albumin: 3.1 g/dL — ABNORMAL LOW (ref 3.5–5.0)
Alkaline Phosphatase: 159 U/L — ABNORMAL HIGH (ref 38–126)
Anion gap: 10 (ref 5–15)
BUN: <5 mg/dL — ABNORMAL LOW (ref 6–20)
CO2: 26 mmol/L (ref 22–32)
Calcium: 8.9 mg/dL (ref 8.9–10.3)
Chloride: 98 mmol/L (ref 98–111)
Creatinine, Ser: 0.74 mg/dL (ref 0.61–1.24)
GFR, Estimated: >60 mL/min (ref >60)
Glucose, Bld: 130 mg/dL — ABNORMAL HIGH (ref 70–99)
Potassium: 3.5 mmol/L (ref 3.5–5.1)
Sodium: 134 mmol/L — ABNORMAL LOW (ref 135–145)
Total Bilirubin: 0.5 mg/dL (ref 0.3–1.2)
Total Protein: 6.6 g/dL (ref 6.5–8.1)

## 2020-12-06 LAB — CBC
HCT: 36.3 % — ABNORMAL LOW (ref 39.0–52.0)
Hemoglobin: 11.3 g/dL — ABNORMAL LOW (ref 13.0–17.0)
MCH: 26.4 pg (ref 26.0–34.0)
MCHC: 31.1 g/dL (ref 30.0–36.0)
MCV: 84.8 fL (ref 80.0–100.0)
Platelets: 444 10*3/uL — ABNORMAL HIGH (ref 150–400)
RBC: 4.28 MIL/uL (ref 4.22–5.81)
RDW: 18.8 % — ABNORMAL HIGH (ref 11.5–15.5)
WBC: 11.6 10*3/uL — ABNORMAL HIGH (ref 4.0–10.5)
nRBC: 0 % (ref 0.0–0.2)

## 2020-12-06 LAB — URINALYSIS, ROUTINE W REFLEX MICROSCOPIC
Bilirubin Urine: NEGATIVE
Glucose, UA: NEGATIVE mg/dL
Hgb urine dipstick: NEGATIVE
Ketones, ur: NEGATIVE mg/dL
Leukocytes,Ua: NEGATIVE
Nitrite: NEGATIVE
Protein, ur: NEGATIVE mg/dL
Specific Gravity, Urine: 1.008 (ref 1.005–1.030)
pH: 5 (ref 5.0–8.0)

## 2020-12-06 LAB — LIPASE, BLOOD: Lipase: 19 U/L (ref 11–51)

## 2020-12-06 NOTE — ED Notes (Signed)
Pt did not want to stay and wait was too long Pt will try again in the am.

## 2020-12-06 NOTE — ED Triage Notes (Signed)
Left upper quadrant pain with nausea and vomiting x 2 days. Hx of pancreatitis and whipple surgery.

## 2020-12-07 ENCOUNTER — Emergency Department (HOSPITAL_COMMUNITY)
Admission: EM | Admit: 2020-12-07 | Discharge: 2020-12-08 | Disposition: A | Discharge status: Home / Self Care | Payer: Medicaid Other | Source: Home / Self Care | Attending: Emergency Medicine | Admitting: Emergency Medicine

## 2020-12-07 ENCOUNTER — Encounter (HOSPITAL_COMMUNITY): Payer: Self-pay | Admitting: Emergency Medicine

## 2020-12-07 DIAGNOSIS — R197 Diarrhea, unspecified: Secondary | ICD-10-CM | POA: Insufficient documentation

## 2020-12-07 DIAGNOSIS — R112 Nausea with vomiting, unspecified: Secondary | ICD-10-CM | POA: Insufficient documentation

## 2020-12-07 DIAGNOSIS — Z8719 Personal history of other diseases of the digestive system: Secondary | ICD-10-CM | POA: Insufficient documentation

## 2020-12-07 DIAGNOSIS — F17210 Nicotine dependence, cigarettes, uncomplicated: Secondary | ICD-10-CM | POA: Insufficient documentation

## 2020-12-07 DIAGNOSIS — R1084 Generalized abdominal pain: Secondary | ICD-10-CM | POA: Insufficient documentation

## 2020-12-07 DIAGNOSIS — I10 Essential (primary) hypertension: Secondary | ICD-10-CM | POA: Insufficient documentation

## 2020-12-07 DIAGNOSIS — Z86012 Personal history of benign carcinoid tumor: Secondary | ICD-10-CM | POA: Insufficient documentation

## 2020-12-07 DIAGNOSIS — R109 Unspecified abdominal pain: Secondary | ICD-10-CM

## 2020-12-07 MED ORDER — HYDROMORPHONE HCL 1 MG/ML IJ SOLN
0.5000 mg | Freq: Once | INTRAMUSCULAR | Status: AC
  Administered 2020-12-08: 0.5 mg via INTRAVENOUS
  Filled 2020-12-07: qty 1

## 2020-12-07 MED ORDER — DIPHENHYDRAMINE HCL 50 MG/ML IJ SOLN
25.0000 mg | Freq: Once | INTRAMUSCULAR | Status: AC
  Administered 2020-12-08: 25 mg via INTRAVENOUS
  Filled 2020-12-07: qty 1

## 2020-12-07 MED ORDER — ALUM & MAG HYDROXIDE-SIMETH 200-200-20 MG/5ML PO SUSP
30.0000 mL | Freq: Once | ORAL | Status: AC
  Administered 2020-12-08: 30 mL via ORAL
  Filled 2020-12-07: qty 30

## 2020-12-07 MED ORDER — SODIUM CHLORIDE 0.9 % IV BOLUS
1000.0000 mL | Freq: Once | INTRAVENOUS | Status: AC
  Administered 2020-12-08: 1000 mL via INTRAVENOUS

## 2020-12-07 MED ORDER — DROPERIDOL 2.5 MG/ML IJ SOLN
1.2500 mg | Freq: Once | INTRAMUSCULAR | Status: AC
  Administered 2020-12-08: 1.25 mg via INTRAVENOUS
  Filled 2020-12-07: qty 2

## 2020-12-07 NOTE — ED Triage Notes (Signed)
Patient here with complaint of chest pain and abdominal pain, patient believes related to chronic pancreatitis. States he was here last night for same but left without being seen due to wait time. Patient alert, oriented, and in no apparent distress at this time.

## 2020-12-07 NOTE — ED Provider Notes (Incomplete)
[facility] EMERGENCY DEPARTMENT Provider Note   CSN: [id number] Arrival date & time: 12/07/20  1805     History Chief Complaint  Patient presents with  . Abdominal Pain    Brent Rogers is a 55 y.o. male.  55 yo M with a chief complaints of abdominal pain.  Patient has a history of chronic abdominal pain.  Is been seen in the ED multiple times for this in the past.  States it feels like his typical pain going on for 3 to 4 days associated with nausea vomiting and diarrhea.  No fevers.  No different from his prior.  Pain worse to the left side.  Worse with movement palpation.  Able to tolerate some liquids but not solids at home.  He came last night due to the way it decided to come back again this evening.  The history is provided by the patient.  Abdominal Pain Pain location:  L flank Pain quality: aching, sharp and shooting   Pain radiates to:  Back Pain severity:  Moderate Onset quality:  Gradual Duration:  4 days Timing:  Constant Progression:  Worsening Chronicity:  Chronic Relieved by:  Nothing Worsened by:  Eating Ineffective treatments:  None tried Associated symptoms: diarrhea, nausea and vomiting   Associated symptoms: no chest pain, no chills, no fever and no shortness of breath        Past Medical History:  Diagnosis Date  . Benign tumor of endocrine pancreas   . Cancer ([place])   . Chronic pancreatitis ([place])    S/P Whipple  . Foot ulcer with fat layer exposed ([place])   . Hypertension   . Migraine    "@ least once/month" (11/12/2015)  . Pancreatic abnormality    CT  shows mass  . Pneumonia 11/12/2015  . Scoliosis     Patient Active Problem List   Diagnosis Date Noted  . Flank pain 08/31/2020  . Abdominal pain 08/30/2020  . 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 Chronic pain syndrome 06/07/2019  . Depression 06/07/2019  . Chronic pancreatitis ([place]) 05/01/2017  . Foot callus 12/25/2015  . Onychomycosis of toenail  12/25/2015  . Fatty liver 11/12/2015  . GERD (gastroesophageal reflux disease) 11/12/2015  . HTN (hypertension) 07/10/2015  . S/P cholecystectomy 07/10/2015  . Tobacco abuse 12/26/2014  . Protein-calorie malnutrition, severe ([place]) 11/20/2014  . Recurrent pancreatitis 06/06/2014  . Exocrine pancreatic insufficiency ([place]) 12/23/2013    Past Surgical History:  Procedure Laterality Date  . BACK SURGERY    . EUS N/A 09/21/2013   Procedure: ESOPHAGEAL ENDOSCOPIC ULTRASOUND (EUS) RADIAL;  Surgeon: Beryle Beams, MD;  Location: [facility];  Service: Endoscopy;  Laterality: N/A;  . EUS N/A 09/30/2013   Procedure: UPPER ENDOSCOPIC ULTRASOUND (EUS) LINEAR;  Surgeon: Beryle Beams, MD;  Location: [facility];  Service: Endoscopy;  Laterality: N/A;  . FINE NEEDLE ASPIRATION N/A 09/21/2013   Procedure: FINE NEEDLE ASPIRATION (FNA) LINEAR;  Surgeon: Beryle Beams, MD;  [facility]: [facility];  Service: Endoscopy;  Laterality: N/A;  . FINGER FRACTURE SURGERY Left 1995   5th digit  . FRACTURE SURGERY    . KNEE ARTHROSCOPY Bilateral 1987-1989   right-left  . LAPAROSCOPY N/A 12/01/2013   Procedure: LAPAROSCOPY DIAGNOSTIC PANCREATICODUODENECTOMY WITH BILIARY AND PANCREATIC STENTS;  Surgeon: Stark Klein, MD;  Location: [facility];  Service: General;  Laterality: N/A;  . LUMBAR [place] SURGERY  1990's  . WHIPPLE PROCEDURE  12/01/2013       Family History  Problem Relation Age of Onset  . Cancer Mother        unsure  . COPD Mother   . Hypertension Mother   . Hypertension Father   . COPD Father   . CAD Sister        possible has stent per pt    Social History   Tobacco Use  . Smoking status: Current Every Day Smoker    Packs/day: 0.25    Years: 30.00    Pack years: 7.50    Types: Cigarettes  . Smokeless tobacco: Former Systems developer    Types: Snuff  . Tobacco comment: "used snuff 10-15 years in my 20s-30s"  Vaping Use  . Vaping Use: Never used  Substance Use Topics  . Alcohol use: Not Currently     Alcohol/week: 4.0 standard drinks    Types: 4 Cans of beer per week    Comment: 05/01/16  . Drug use: No    Home Medications Prior to Admission medications   Medication Sig Start Date End Date Taking? Authorizing Provider  acetaminophen (TYLENOL) 500 MG tablet Take 1,000 mg by mouth every 6 (six) hours as needed for mild pain.    [provider]  bismuth subsalicylate (PEPTO BISMOL) 262 MG/15ML suspension Take 30 mLs by mouth every 6 (six) hours as needed for indigestion or diarrhea or loose stools.    [provider]  buPROPion (WELLBUTRIN SR) 100 MG 12 hr tablet Take 1 tablet (100 mg total) by mouth 2 (two) times daily. 06/22/20   Fulp, Cammie, MD  dicyclomine (BENTYL) 20 MG tablet 1 pill up to 4 times per day as needed for abdominal pain Patient taking differently: Take 20 mg by mouth 4 (four) times daily as needed for spasms (abdominal pain).  06/22/20   Fulp, Cammie, MD  ferrous sulfate 325 (65 FE) MG tablet Take 1 tablet (325 mg total) by mouth daily. 09/01/20 10/01/20  Cherylann Ratel A, DO  HYDROcodone-acetaminophen (NORCO/VICODIN) 5-325 MG tablet Take 1 tablet by mouth every 4 (four) hours as needed. 11/10/20   Henderly, Britni A, PA-C  ondansetron (ZOFRAN ODT) 4 MG disintegrating tablet Take 1 tablet (4 mg total) by mouth every 8 (eight) hours as needed for nausea or vomiting. 11/10/20   Henderly, Britni A, PA-C  Pancrelipase, Lip-Prot-Amyl, (CREON) 24000-76000 units CPEP Take 1 capsule (24,000 Units total) by mouth with breakfast, with lunch, and with evening meal. 06/22/20   Fulp, Cammie, MD  pantoprazole (PROTONIX) 40 MG tablet Take 1 tablet (40 mg total) by mouth daily. 09/01/20   Cherylann Ratel A, DO  promethazine (PHENERGAN) 25 MG tablet TAKE 1 TABLET BY MOUTH EVERY 8 HOURS AS NEEDED FOR NAUSEA OR VOMITING. Patient not taking: Reported on 08/30/2020 08/29/20   Ladell Pier, MD  traMADol (ULTRAM) 50 MG tablet Take 1 tablet (50 mg total) by mouth every 12 (twelve) hours  as needed for moderate pain. 06/22/20   Antony Blackbird, MD    Allergies    Patient has no known allergies.  Review of Systems   Review of Systems  Constitutional: Negative for chills and fever.  HENT: Negative for congestion and facial swelling.   Eyes: Negative for discharge and visual disturbance.  Respiratory: Negative for shortness of breath.   Cardiovascular: Negative for chest pain and palpitations.  Gastrointestinal: Positive for abdominal pain, diarrhea, nausea and vomiting.  Musculoskeletal: Negative for arthralgias and myalgias.  Skin: Negative for color change and rash.  Neurological: Negative for tremors, syncope and headaches.  Psychiatric/Behavioral:  Negative for confusion and dysphoric mood.    Physical Exam Updated Vital Signs BP 126/89   Pulse 90   Temp 98.4 F (36.9 C)   Resp 17   SpO2 98%   Physical Exam Vitals and nursing note reviewed.  Constitutional:      Appearance: He is well-developed.  HENT:     Head: Normocephalic and atraumatic.  Eyes:     Pupils: Pupils are equal, round, and reactive to light.  Neck:     Vascular: No JVD.  Cardiovascular:     Rate and Rhythm: Normal rate and regular rhythm.     Heart sounds: No murmur heard. No friction rub. No gallop.   Pulmonary:     Effort: No respiratory distress.     Breath sounds: No wheezing.  Abdominal:     General: There is no distension.     Tenderness: There is no guarding or rebound.     Comments: Mild diffuse, distractable  Musculoskeletal:        General: Normal range of motion.     Cervical back: Normal range of motion and neck supple.  Skin:    Coloration: Skin is not pale.     Findings: No rash.  Neurological:     Mental Status: He is alert and oriented to person, place, and time.  Psychiatric:        Behavior: Behavior normal.     ED Results / Procedures / Treatments   Labs (all labs ordered are listed, but only abnormal results are displayed) Labs Reviewed  URINALYSIS,  ROUTINE W REFLEX MICROSCOPIC    EKG EKG Interpretation  Date/Time:  Friday December 07 2020 18:32:04 EST Ventricular Rate:  95 PR Interval:  146 QRS Duration: 78 QT Interval:  366 QTC Calculation: 459 R Axis:   70 Text Interpretation: Normal sinus rhythm Right atrial enlargement Borderline ECG No significant change since last tracing Confirmed by Deno Etienne [phone]) on 12/07/2020 11:35:20 PM   Radiology No results found.  Procedures Procedures {Remember to document critical care time when appropriate:1}  Medications Ordered in ED Medications  sodium chloride 0.9 % bolus 1,000 mL (has no administration in time range)  droperidol (INAPSINE) 2.5 MG/ML injection 1.25 mg (has no administration in time range)  diphenhydrAMINE (BENADRYL) injection 25 mg (has no administration in time range)  HYDROmorphone (DILAUDID) injection 0.5 mg (has no administration in time range)  alum & mag hydroxide-simeth (MAALOX/MYLANTA) 200-200-20 MG/5ML suspension 30 mL (has no administration in time range)    ED Course  I have reviewed the triage vital signs and the nursing notes.  Pertinent labs & imaging results that were available during my care of the patient were reviewed by me and considered in my medical decision making (see chart for details).    MDM Rules/Calculators/A&P                          55 yo M with a cc of L flank pain.  Hx of chronic abdominal pain.   Final Clinical Impression(s) / ED Diagnoses Final diagnoses:  None    Rx / DC Orders ED Discharge Orders    None

## 2020-12-07 NOTE — ED Provider Notes (Signed)
[facility] EMERGENCY DEPARTMENT Provider Note   CSN: [id number] Arrival date & time: 12/07/20  1805     History Chief Complaint  Patient presents with  . Abdominal Pain    Brent Rogers is a 55 y.o. male.  55 yo M with a chief complaints of abdominal pain.  Patient has a history of chronic abdominal pain.  Is been seen in the ED multiple times for this in the past.  States it feels like his typical pain going on for 3 to 4 days associated with nausea vomiting and diarrhea.  No fevers.  No different from his prior.  Pain worse to the left side.  Worse with movement palpation.  Able to tolerate some liquids but not solids at home.  He came last night due to the way it decided to come back again this evening.  The history is provided by the patient.  Abdominal Pain Pain location:  L flank Pain quality: aching, sharp and shooting   Pain radiates to:  Back Pain severity:  Moderate Onset quality:  Gradual Duration:  4 days Timing:  Constant Progression:  Worsening Chronicity:  Chronic Relieved by:  Nothing Worsened by:  Eating Ineffective treatments:  None tried Associated symptoms: diarrhea, nausea and vomiting   Associated symptoms: no chest pain, no chills, no fever and no shortness of breath        Past Medical History:  Diagnosis Date  . Benign tumor of endocrine pancreas   . Cancer ([place])   . Chronic pancreatitis ([place])    S/P Whipple  . Foot ulcer with fat layer exposed ([place])   . Hypertension   . Migraine    "@ least once/month" (11/12/2015)  . Pancreatic abnormality    CT  shows mass  . Pneumonia 11/12/2015  . Scoliosis     Patient Active Problem List   Diagnosis Date Noted  . Flank pain 08/31/2020  . Abdominal pain 08/30/2020  . 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 Chronic pain syndrome 06/07/2019  . Depression 06/07/2019  . Chronic pancreatitis ([place]) 05/01/2017  . Foot callus 12/25/2015  . Onychomycosis of toenail  12/25/2015  . Fatty liver 11/12/2015  . GERD (gastroesophageal reflux disease) 11/12/2015  . HTN (hypertension) 07/10/2015  . S/P cholecystectomy 07/10/2015  . Tobacco abuse 12/26/2014  . Protein-calorie malnutrition, severe ([place]) 11/20/2014  . Recurrent pancreatitis 06/06/2014  . Exocrine pancreatic insufficiency ([place]) 12/23/2013    Past Surgical History:  Procedure Laterality Date  . BACK SURGERY    . EUS N/A 09/21/2013   Procedure: ESOPHAGEAL ENDOSCOPIC ULTRASOUND (EUS) RADIAL;  Surgeon: Beryle Beams, MD;  [facility];  Service: Endoscopy;  Laterality: N/A;  . EUS N/A 09/30/2013   Procedure: UPPER ENDOSCOPIC ULTRASOUND (EUS) LINEAR;  Surgeon: Beryle Beams, MD;  [facility];  Service: Endoscopy;  Laterality: N/A;  . FINE NEEDLE ASPIRATION N/A 09/21/2013   Procedure: FINE NEEDLE ASPIRATION (FNA) LINEAR;  Surgeon: Beryle Beams, MD;  [facility];  Service: Endoscopy;  Laterality: N/A;  . FINGER FRACTURE SURGERY Left 1995   5th digit  . FRACTURE SURGERY    . KNEE ARTHROSCOPY Bilateral 1987-1989   right-left  . LAPAROSCOPY N/A 12/01/2013   Procedure: LAPAROSCOPY DIAGNOSTIC PANCREATICODUODENECTOMY WITH BILIARY AND PANCREATIC STENTS;  Surgeon: Stark Klein, MD;  Location: [facility];  Service: General;  Laterality: N/A;  . LUMBAR [place] SURGERY  1990's  . WHIPPLE PROCEDURE  12/01/2013       Family History  Problem Relation Age of Onset  . Cancer Mother        unsure  . COPD Mother   . Hypertension Mother   . Hypertension Father   . COPD Father   . CAD Sister        possible has stent per pt    Social History   Tobacco Use  . Smoking status: Current Every Day Smoker    Packs/day: 0.25    Years: 30.00    Pack years: 7.50    Types: Cigarettes  . Smokeless tobacco: Former Systems developer    Types: Snuff  . Tobacco comment: "used snuff 10-15 years in my 20s-30s"  Vaping Use  . Vaping Use: Never used  Substance Use Topics  . Alcohol use: Not Currently     Alcohol/week: 4.0 standard drinks    Types: 4 Cans of beer per week    Comment: 05/01/16  . Drug use: No    Home Medications Prior to Admission medications   Medication Sig Start Date End Date Taking? Authorizing Provider  promethazine (PHENERGAN) 25 MG suppository Place 1 suppository (25 mg total) rectally every 6 (six) hours as needed for nausea or vomiting. 12/08/20  Yes Deno Etienne, DO  acetaminophen (TYLENOL) 500 MG tablet Take 1,000 mg by mouth every 6 (six) hours as needed for mild pain.    [provider]  bismuth subsalicylate (PEPTO BISMOL) 262 MG/15ML suspension Take 30 mLs by mouth every 6 (six) hours as needed for indigestion or diarrhea or loose stools.    [provider]  buPROPion (WELLBUTRIN SR) 100 MG 12 hr tablet Take 1 tablet (100 mg total) by mouth 2 (two) times daily. 06/22/20   Fulp, Cammie, MD  dicyclomine (BENTYL) 20 MG tablet 1 pill up to 4 times per day as needed for abdominal pain Patient taking differently: Take 20 mg by mouth 4 (four) times daily as needed for spasms (abdominal pain).  06/22/20   Fulp, Cammie, MD  ferrous sulfate 325 (65 FE) MG tablet Take 1 tablet (325 mg total) by mouth daily. 09/01/20 10/01/20  Cherylann Ratel A, DO  HYDROcodone-acetaminophen (NORCO/VICODIN) 5-325 MG tablet Take 1 tablet by mouth every 4 (four) hours as needed. 11/10/20   Henderly, Britni A, PA-C  ondansetron (ZOFRAN ODT) 4 MG disintegrating tablet Take 1 tablet (4 mg total) by mouth every 8 (eight) hours as needed for nausea or vomiting. 11/10/20   Henderly, Britni A, PA-C  Pancrelipase, Lip-Prot-Amyl, (CREON) 24000-76000 units CPEP Take 1 capsule (24,000 Units total) by mouth with breakfast, with lunch, and with evening meal. 06/22/20   Fulp, Cammie, MD  pantoprazole (PROTONIX) 40 MG tablet Take 1 tablet (40 mg total) by mouth daily. 09/01/20   Cherylann Ratel A, DO  traMADol (ULTRAM) 50 MG tablet Take 1 tablet (50 mg total) by mouth every 12 (twelve) hours as needed for  moderate pain. 06/22/20   Antony Blackbird, MD    Allergies    Patient has no known allergies.  Review of Systems   Review of Systems  Constitutional: Negative for chills and fever.  HENT: Negative for congestion and facial swelling.   Eyes: Negative for discharge and visual disturbance.  Respiratory: Negative for shortness of breath.   Cardiovascular: Negative for chest pain and palpitations.  Gastrointestinal: Positive for abdominal pain, diarrhea, nausea and vomiting.  Musculoskeletal: Negative for arthralgias and myalgias.  Skin: Negative for color change and rash.  Neurological: Negative for tremors, syncope and headaches.  Psychiatric/Behavioral: Negative for confusion and  dysphoric mood.    Physical Exam Updated Vital Signs BP (!) 152/103   Pulse 73   Temp 98.4 F (36.9 C)   Resp 11   SpO2 100%   Physical Exam Vitals and nursing note reviewed.  Constitutional:      Appearance: He is well-developed.  HENT:     Head: Normocephalic and atraumatic.  Eyes:     Pupils: Pupils are equal, round, and reactive to light.  Neck:     Vascular: No JVD.  Cardiovascular:     Rate and Rhythm: Normal rate and regular rhythm.     Heart sounds: No murmur heard. No friction rub. No gallop.   Pulmonary:     Effort: No respiratory distress.     Breath sounds: No wheezing.  Abdominal:     General: There is no distension.     Tenderness: There is no guarding or rebound.     Comments: Mild diffuse, distractable  Musculoskeletal:        General: Normal range of motion.     Cervical back: Normal range of motion and neck supple.  Skin:    Coloration: Skin is not pale.     Findings: No rash.  Neurological:     Mental Status: He is alert and oriented to person, place, and time.  Psychiatric:        Behavior: Behavior normal.     ED Results / Procedures / Treatments   Labs (all labs ordered are listed, but only abnormal results are displayed) Labs Reviewed  URINALYSIS, ROUTINE W  REFLEX MICROSCOPIC    EKG EKG Interpretation  Date/Time:  Friday December 07 2020 18:32:04 EST Ventricular Rate:  95 PR Interval:  146 QRS Duration: 78 QT Interval:  366 QTC Calculation: 459 R Axis:   70 Text Interpretation: Normal sinus rhythm Right atrial enlargement Borderline ECG No significant change since last tracing Confirmed by Deno Etienne [phone]) on 12/07/2020 11:35:20 PM   Radiology No results found.  Procedures Procedures   Medications Ordered in ED Medications  HYDROmorphone (DILAUDID) injection 0.5 mg (has no administration in time range)  sodium chloride 0.9 % bolus 1,000 mL (1,000 mLs Intravenous New Bag/Given 12/08/20 0008)  droperidol (INAPSINE) 2.5 MG/ML injection 1.25 mg (1.25 mg Intravenous Given 12/08/20 0002)  diphenhydrAMINE (BENADRYL) injection 25 mg (25 mg Intravenous Given 12/08/20 0002)  HYDROmorphone (DILAUDID) injection 0.5 mg (0.5 mg Intravenous Given 12/08/20 0003)  alum & mag hydroxide-simeth (MAALOX/MYLANTA) 200-200-20 MG/5ML suspension 30 mL (30 mLs Oral Given 12/08/20 0001)    ED Course  I have reviewed the triage vital signs and the nursing notes.  Pertinent labs & imaging results that were available during my care of the patient were reviewed by me and considered in my medical decision making (see chart for details).    MDM Rules/Calculators/A&P                          55 yo M with a cc of L flank pain.  Hx of chronic abdominal pain.  We will treat pain and nausea here.  Bolus of IV fluids.  Reassess.  Lab work done yesterday without significant LFT elevation lipase is normal.  Patient tolerating by mouth here.  Will discharge patient home.  PCP follow-up.  1:13 AM:  I have discussed the diagnosis/risks/treatment options with the patient and believe the pt to be eligible for discharge home to follow-up with PCP. We also discussed returning to the ED immediately  if new or worsening sx occur. We discussed the sx which are most concerning (e.g.,  sudden worsening pain, fever, inability to tolerate by mouth) that necessitate immediate return. Medications administered to the patient during their visit and any new prescriptions provided to the patient are listed below.  Medications given during this visit Medications  HYDROmorphone (DILAUDID) injection 0.5 mg (has no administration in time range)  sodium chloride 0.9 % bolus 1,000 mL (1,000 mLs Intravenous New Bag/Given 12/08/20 0008)  droperidol (INAPSINE) 2.5 MG/ML injection 1.25 mg (1.25 mg Intravenous Given 12/08/20 0002)  diphenhydrAMINE (BENADRYL) injection 25 mg (25 mg Intravenous Given 12/08/20 0002)  HYDROmorphone (DILAUDID) injection 0.5 mg (0.5 mg Intravenous Given 12/08/20 0003)  alum & mag hydroxide-simeth (MAALOX/MYLANTA) 200-200-20 MG/5ML suspension 30 mL (30 mLs Oral Given 12/08/20 0001)     The patient appears reasonably screen and/or stabilized for discharge and I doubt any other medical condition or other [facility] requiring further screening, evaluation, or treatment in the ED at this time prior to discharge.   Final Clinical Impression(s) / ED Diagnoses Final diagnoses:  Left sided abdominal pain    Rx / DC Orders ED Discharge Orders         Ordered    promethazine (PHENERGAN) 25 MG suppository  Every 6 hours PRN        12/08/20 0111           Deno Etienne, DO 12/08/20 0113

## 2020-12-08 ENCOUNTER — Other Ambulatory Visit (HOSPITAL_COMMUNITY): Payer: Self-pay | Admitting: Emergency Medicine

## 2020-12-08 MED ORDER — HYDROMORPHONE HCL 1 MG/ML IJ SOLN
0.5000 mg | Freq: Once | INTRAMUSCULAR | Status: AC
  Administered 2020-12-08: 0.5 mg via INTRAVENOUS
  Filled 2020-12-08: qty 1

## 2020-12-08 MED ORDER — PROMETHAZINE HCL 25 MG RE SUPP: 25.0000 mg | Freq: Four times a day (QID) | RECTAL | 0 refills | Status: DC | PRN

## 2020-12-08 NOTE — ED Notes (Signed)
Patient verbalizes understanding of discharge instructions. Prescriptions reviewed.  Opportunity for questioning and answers were provided. Armband removed by staff, pt discharged from ED ambulatory.

## 2020-12-08 NOTE — Discharge Instructions (Signed)
Try pepcid or tagamet up to twice a day.  Try to avoid things that may make this worse, most commonly these are spicy foods tomato based products fatty foods chocolate and peppermint.  Alcohol and tobacco can also make this worse.  Return to the emergency department for sudden worsening pain fever or inability to eat or drink.  

## 2020-12-10 ENCOUNTER — Telehealth: Payer: Self-pay

## 2020-12-10 NOTE — Telephone Encounter (Signed)
Transition Care Management Follow-up Telephone Call  Date of discharge and from where: 12/08/2020 from [facility]  How have you been since you were released from the hospital? Pt stated that he is doing better today than he was this weekend.   Any questions or concerns? No  Items Reviewed:  Did the pt receive and understand the discharge instructions provided? Yes   Medications obtained and verified? Yes   Other? No   Any new allergies since your discharge? No   Dietary orders reviewed? n/a  Do you have support at home? Yes    Functional Questionnaire: (I = Independent and D = Dependent) ADLs: I  Bathing/Dressing- I  Meal Prep- I  Eating- I  Maintaining continence- I  Transferring/Ambulation- I  Managing Meds- I   Follow up appointments reviewed:   PCP Hospital f/u appt confirmed? No  Pt stated that he will cal PCP for an appointment.   Are transportation arrangements needed? No   If their condition worsens, is the pt aware to call PCP or go to the Emergency Dept.? Yes  Was the patient provided with contact information for the PCP's office or ED? Yes  Was to pt encouraged to call back with questions or concerns? Yes

## 2021-01-19 ENCOUNTER — Emergency Department (HOSPITAL_COMMUNITY)
Admission: EM | Admit: 2021-01-19 | Discharge: 2021-01-19 | Disposition: A | Discharge status: Home / Self Care | Payer: Medicaid Other | Attending: Emergency Medicine | Admitting: Emergency Medicine

## 2021-01-19 ENCOUNTER — Emergency Department (HOSPITAL_COMMUNITY): Payer: Medicaid Other

## 2021-01-19 ENCOUNTER — Encounter (HOSPITAL_COMMUNITY): Payer: Self-pay

## 2021-01-19 DIAGNOSIS — R10812 Left upper quadrant abdominal tenderness: Secondary | ICD-10-CM | POA: Insufficient documentation

## 2021-01-19 DIAGNOSIS — R1013 Epigastric pain: Secondary | ICD-10-CM

## 2021-01-19 DIAGNOSIS — F17210 Nicotine dependence, cigarettes, uncomplicated: Secondary | ICD-10-CM | POA: Insufficient documentation

## 2021-01-19 DIAGNOSIS — R300 Dysuria: Secondary | ICD-10-CM | POA: Diagnosis not present

## 2021-01-19 DIAGNOSIS — R112 Nausea with vomiting, unspecified: Secondary | ICD-10-CM

## 2021-01-19 DIAGNOSIS — I10 Essential (primary) hypertension: Secondary | ICD-10-CM | POA: Insufficient documentation

## 2021-01-19 DIAGNOSIS — R1032 Left lower quadrant pain: Secondary | ICD-10-CM | POA: Diagnosis present

## 2021-01-19 LAB — CBC WITH DIFFERENTIAL/PLATELET
Abs Immature Granulocytes: 0.02 10*3/uL (ref 0.00–0.07)
Basophils Absolute: 0.2 10*3/uL — ABNORMAL HIGH (ref 0.0–0.1)
Basophils Relative: 3 %
Eosinophils Absolute: 0.1 10*3/uL (ref 0.0–0.5)
Eosinophils Relative: 2 %
HCT: 34.4 % — ABNORMAL LOW (ref 39.0–52.0)
Hemoglobin: 10.9 g/dL — ABNORMAL LOW (ref 13.0–17.0)
Immature Granulocytes: 0 %
Lymphocytes Relative: 42 %
Lymphs Abs: 2.5 10*3/uL (ref 0.7–4.0)
MCH: 26 pg (ref 26.0–34.0)
MCHC: 31.7 g/dL (ref 30.0–36.0)
MCV: 82.1 fL (ref 80.0–100.0)
Monocytes Absolute: 0.6 10*3/uL (ref 0.1–1.0)
Monocytes Relative: 10 %
Neutro Abs: 2.5 10*3/uL (ref 1.7–7.7)
Neutrophils Relative %: 43 %
Platelets: 308 10*3/uL (ref 150–400)
RBC: 4.19 MIL/uL — ABNORMAL LOW (ref 4.22–5.81)
RDW: 22.5 % — ABNORMAL HIGH (ref 11.5–15.5)
WBC: 5.9 10*3/uL (ref 4.0–10.5)
nRBC: 0 % (ref 0.0–0.2)

## 2021-01-19 LAB — COMPREHENSIVE METABOLIC PANEL
ALT: 40 U/L (ref 0–44)
AST: 135 U/L — ABNORMAL HIGH (ref 15–41)
Albumin: 3.3 g/dL — ABNORMAL LOW (ref 3.5–5.0)
Alkaline Phosphatase: 106 U/L (ref 38–126)
Anion gap: 13 (ref 5–15)
BUN: 6 mg/dL (ref 6–20)
CO2: 21 mmol/L — ABNORMAL LOW (ref 22–32)
Calcium: 8.7 mg/dL — ABNORMAL LOW (ref 8.9–10.3)
Chloride: 106 mmol/L (ref 98–111)
Creatinine, Ser: 0.72 mg/dL (ref 0.61–1.24)
GFR, Estimated: >60 mL/min (ref >60)
Glucose, Bld: 102 mg/dL — ABNORMAL HIGH (ref 70–99)
Potassium: 3.7 mmol/L (ref 3.5–5.1)
Sodium: 140 mmol/L (ref 135–145)
Total Bilirubin: 0.2 mg/dL — ABNORMAL LOW (ref 0.3–1.2)
Total Protein: 6.7 g/dL (ref 6.5–8.1)

## 2021-01-19 LAB — URINALYSIS, ROUTINE W REFLEX MICROSCOPIC
Bilirubin Urine: NEGATIVE
Glucose, UA: NEGATIVE mg/dL
Hgb urine dipstick: NEGATIVE
Ketones, ur: NEGATIVE mg/dL
Leukocytes,Ua: NEGATIVE
Nitrite: NEGATIVE
Protein, ur: NEGATIVE mg/dL
Specific Gravity, Urine: 1.01 (ref 1.005–1.030)
pH: 5 (ref 5.0–8.0)

## 2021-01-19 LAB — LIPASE, BLOOD: Lipase: 20 U/L (ref 11–51)

## 2021-01-19 MED ORDER — IOHEXOL 300 MG/ML  SOLN
100.0000 mL | Freq: Once | INTRAMUSCULAR | Status: AC | PRN
  Administered 2021-01-19: 100 mL via INTRAVENOUS

## 2021-01-19 MED ORDER — OXYCODONE-ACETAMINOPHEN 5-325 MG PO TABS: 1.0000 | ORAL_TABLET | ORAL | 0 refills | Status: DC | PRN

## 2021-01-19 MED ORDER — HYDROMORPHONE HCL 1 MG/ML IJ SOLN
1.0000 mg | Freq: Once | INTRAMUSCULAR | Status: AC
  Administered 2021-01-19: 1 mg via INTRAVENOUS
  Filled 2021-01-19: qty 1

## 2021-01-19 MED ORDER — SODIUM CHLORIDE 0.9 % IV BOLUS
1000.0000 mL | Freq: Once | INTRAVENOUS | Status: AC
  Administered 2021-01-19: 1000 mL via INTRAVENOUS

## 2021-01-19 MED ORDER — LACTATED RINGERS IV BOLUS
1000.0000 mL | Freq: Once | INTRAVENOUS | Status: AC
  Administered 2021-01-19: 1000 mL via INTRAVENOUS

## 2021-01-19 MED ORDER — MORPHINE SULFATE (PF) 4 MG/ML IV SOLN
4.0000 mg | Freq: Once | INTRAVENOUS | Status: AC
Start: 2021-01-19 — End: 2021-01-19
  Administered 2021-01-19: 4 mg via INTRAVENOUS
  Filled 2021-01-19: qty 1

## 2021-01-19 MED ORDER — ONDANSETRON HCL 4 MG/2ML IJ SOLN
4.0000 mg | Freq: Once | INTRAMUSCULAR | Status: AC
  Administered 2021-01-19: 4 mg via INTRAVENOUS
  Filled 2021-01-19: qty 2

## 2021-01-19 MED ORDER — LORAZEPAM 2 MG/ML IJ SOLN
1.0000 mg | Freq: Once | INTRAMUSCULAR | Status: AC
  Administered 2021-01-19: 1 mg via INTRAVENOUS
  Filled 2021-01-19: qty 1

## 2021-01-19 MED ORDER — ONDANSETRON 4 MG PO TBDP
4.0000 mg | ORAL_TABLET | Freq: Three times a day (TID) | ORAL | 0 refills | Status: DC | PRN
  Filled 2021-01-19: qty 10, 4d supply, fill #0

## 2021-01-19 NOTE — ED Triage Notes (Signed)
Patient has n/v x 2 days. Patient has chronic pancreatitis. Patient has 10/10 abd pain. Last alcoholic drink was today.

## 2021-01-19 NOTE — ED Provider Notes (Signed)
[facility] DEPT Provider Note   CSN: [id number] Arrival date & time: 01/19/21  0128     History Chief Complaint  Patient presents with  . Abdominal Pain  . Nausea    Brent Rogers is a 55 y.o. male.  Patient presents to the emergency department with a chief complaint of abdominal pain and vomiting.  He states that this feels like his pancreatitis.  He has history of chronic pancreatitis and is status post Whipple procedure in 2015.  He reports that the vomiting and pain started 2 days ago.  States that he was drinking some alcohol today, and believes it may have exacerbated his symptoms.  He denies any fevers or chills.  Denies seeing any blood in his vomit.  He states that he is having more pain in the left lower quadrant that is typical for him.  He also reports dysuria, which is new for him.  The history is provided by the patient. No language interpreter was used.       Past Medical History:  Diagnosis Date  . Benign tumor of endocrine pancreas   . Chronic pancreatitis ([place])    S/P Whipple  . Foot ulcer with fat layer exposed ([place])   . Hypertension   . Migraine    "@ least once/month" (11/12/2015)  . Pancreatic abnormality    CT  shows mass  . Pneumonia 11/12/2015  . Scoliosis     Patient Active Problem List   Diagnosis Date Noted  . Flank pain 08/31/2020  . Abdominal pain 08/30/2020  . 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 Chronic pain syndrome 06/07/2019  . Depression 06/07/2019  . Chronic pancreatitis ([place]) 05/01/2017  . Foot callus 12/25/2015  . Onychomycosis of toenail 12/25/2015  . Fatty liver 11/12/2015  . GERD (gastroesophageal reflux disease) 11/12/2015  . HTN (hypertension) 07/10/2015  . S/P cholecystectomy 07/10/2015  . Tobacco abuse 12/26/2014  . Protein-calorie malnutrition, severe ([place]) 11/20/2014  . Recurrent pancreatitis 06/06/2014  . Exocrine pancreatic insufficiency ([place]) 12/23/2013    Past  Surgical History:  Procedure Laterality Date  . BACK SURGERY    . EUS N/A 09/21/2013   Procedure: ESOPHAGEAL ENDOSCOPIC ULTRASOUND (EUS) RADIAL;  Surgeon: Beryle Beams, MD;  Location: [facility];  Service: Endoscopy;  Laterality: N/A;  . EUS N/A 09/30/2013   Procedure: UPPER ENDOSCOPIC ULTRASOUND (EUS) LINEAR;  Surgeon: Beryle Beams, MD;  Location: [facility];  Service: Endoscopy;  Laterality: N/A;  . FINE NEEDLE ASPIRATION N/A 09/21/2013   Procedure: FINE NEEDLE ASPIRATION (FNA) LINEAR;  Surgeon: Beryle Beams, MD;  [facility];  Service: Endoscopy;  Laterality: N/A;  . FINGER FRACTURE SURGERY Left 1995   5th digit  . FRACTURE SURGERY    . KNEE ARTHROSCOPY Bilateral 1987-1989   right-left  . LAPAROSCOPY N/A 12/01/2013   Procedure: LAPAROSCOPY DIAGNOSTIC PANCREATICODUODENECTOMY WITH BILIARY AND PANCREATIC STENTS;  Surgeon: Stark Klein, MD;  Location: [facility];  Service: General;  Laterality: N/A;  . LUMBAR [place] SURGERY  1990's  . WHIPPLE PROCEDURE  12/01/2013       Family History  Problem Relation Age of Onset  . Cancer Mother        unsure  . COPD Mother   . Hypertension Mother   . Hypertension Father   . COPD Father   . CAD Sister        possible has stent per pt    Social History   Tobacco Use  . Smoking status: Current  Every Day Smoker    Packs/day: 0.25    Years: 30.00    Pack years: 7.50    Types: Cigarettes  . Smokeless tobacco: Former Systems developer    Types: Snuff  . Tobacco comment: "used snuff 10-15 years in my 20s-30s"  Vaping Use  . Vaping Use: Never used  Substance Use Topics  . Alcohol use: Yes    Alcohol/week: 4.0 standard drinks    Types: 4 Cans of beer per week    Comment: weekly  . Drug use: No    Home Medications Prior to Admission medications   Medication Sig Start Date End Date Taking? Authorizing Provider  acetaminophen (TYLENOL) 500 MG tablet Take 1,000 mg by mouth every 6 (six) hours as needed for mild pain.    [provider]  bismuth subsalicylate (PEPTO BISMOL) 262 MG/15ML suspension Take 30 mLs by mouth every 6 (six) hours as needed for indigestion or diarrhea or loose stools.    [provider]  buPROPion (WELLBUTRIN SR) 100 MG 12 hr tablet Take 1 tablet (100 mg total) by mouth 2 (two) times daily. 06/22/20   Fulp, Cammie, MD  dicyclomine (BENTYL) 20 MG tablet TAKE 1 TABLET BY MOUTH UP TO 4 TIMES PER DAY AS NEEDED FOR ABDOMINAL PAIN Patient taking differently: Take 20 mg by mouth 4 (four) times daily as needed for spasms (abdominal pain).  06/22/20 06/22/21  Fulp, Cammie, MD  ferrous sulfate 325 (65 FE) MG tablet Take 1 tablet (325 mg total) by mouth daily. 09/01/20 10/01/20  Cherylann Ratel A, DO  HYDROcodone-acetaminophen (NORCO/VICODIN) 5-325 MG tablet Take 1 tablet by mouth every 4 (four) hours as needed. 11/10/20   Henderly, Britni A, PA-C  ondansetron (ZOFRAN ODT) 4 MG disintegrating tablet Take 1 tablet (4 mg total) by mouth every 8 (eight) hours as needed for nausea or vomiting. 11/10/20   Henderly, Britni A, PA-C  Pancrelipase, Lip-Prot-Amyl, (CREON) 24000-76000 units CPEP Take 1 capsule (24,000 Units total) by mouth with breakfast, with lunch, and with evening meal. 06/22/20   Fulp, Cammie, MD  pantoprazole (PROTONIX) 40 MG tablet TAKE 1 TABLET (40 MG TOTAL) BY MOUTH DAILY. 09/01/20 09/01/21  Cherylann Ratel A, DO  promethazine (PHENERGAN) 25 MG suppository PLACE 1 SUPPOSITORY (25 MG TOTAL) RECTALLY EVERY 6 (SIX) HOURS AS NEEDED FOR NAUSEA OR VOMITING. 12/08/20 12/08/21  Deno Etienne, DO    Allergies    Patient has no known allergies.  Review of Systems   Review of Systems  All other systems reviewed and are negative.   Physical Exam Updated Vital Signs BP (!) 126/100 (BP Location: Left Arm)   Pulse 69   Temp (!) 97.5 F (36.4 C) (Oral)   Ht 6\' 3"  (1.905 m)   Wt 65.8 kg   SpO2 100%   BMI 18.12 kg/m   Physical Exam Vitals and nursing note reviewed.  Constitutional:      Appearance: He  is well-developed.     Comments: Vomiting  HENT:     Head: Normocephalic and atraumatic.  Eyes:     Conjunctiva/sclera: Conjunctivae normal.  Cardiovascular:     Rate and Rhythm: Normal rate and regular rhythm.     Heart sounds: No murmur heard.   Pulmonary:     Effort: Pulmonary effort is normal. No respiratory distress.     Breath sounds: Normal breath sounds.  Abdominal:     Palpations: Abdomen is soft.     Tenderness: There is abdominal tenderness.     Comments: Generalized  abdominal tenderness, worse on the left upper and lower quadrants  Musculoskeletal:     Cervical back: Neck supple.  Skin:    General: Skin is warm and dry.  Neurological:     Mental Status: He is alert and oriented to person, place, and time.  Psychiatric:        Mood and Affect: Mood normal.        Behavior: Behavior normal.     ED Results / Procedures / Treatments   Labs (all labs ordered are listed, but only abnormal results are displayed) Labs Reviewed  CBC WITH DIFFERENTIAL/PLATELET  COMPREHENSIVE METABOLIC PANEL  LIPASE, BLOOD  URINALYSIS, ROUTINE W REFLEX MICROSCOPIC    EKG None  Radiology No results found.  Procedures Procedures   Medications Ordered in ED Medications  sodium chloride 0.9 % bolus 1,000 mL (has no administration in time range)  HYDROmorphone (DILAUDID) injection 1 mg (has no administration in time range)  ondansetron (ZOFRAN) injection 4 mg (has no administration in time range)    ED Course  I have reviewed the triage vital signs and the nursing notes.  Pertinent labs & imaging results that were available during my care of the patient were reviewed by me and considered in my medical decision making (see chart for details).    MDM Rules/Calculators/A&P                          This patient complains of abdominal pain that feels like his typical pancreatitis flare. This involves an extensive number of treatment options, and is a complaint that carries with  it a high risk of complications and morbidity.    Differential Dx Pancreatitis, gastritis, chronic pain, SBO  Pertinent Labs I ordered, reviewed, and interpreted labs, which included no significant leukocytosis, no significant electrolyte derangement, normal urinalysis.  Lipase is normal.  Imaging Interpretation I ordered imaging studies which included CT abdomen/pelvis, which showed no acute pathology..   Medications I ordered medication morphine and fluids and Zofran for pain and nausea.  Sources Previous records obtained and reviewed history of pancreatitis.   Critical Interventions  None  Reassessments After the interventions stated above, I reevaluated the patient and found somewhat improved, agreeable with discharge plan.  Consultants None  Plan Discharge    Final Clinical Impression(s) / ED Diagnoses Final diagnoses:  Epigastric pain  Non-intractable vomiting with nausea, unspecified vomiting type    Rx / DC Orders ED Discharge Orders         Ordered    oxyCODONE-acetaminophen (PERCOCET) 5-325 MG tablet  Every 4 hours PRN        01/19/21 0615    ondansetron (ZOFRAN ODT) 4 MG disintegrating tablet  Every 8 hours PRN        01/19/21 0615           Montine Circle, PA-C 01/19/21 [id number]    Fatima Blank, MD 01/19/21 0710

## 2021-01-21 ENCOUNTER — Other Ambulatory Visit: Payer: Self-pay

## 2021-01-28 ENCOUNTER — Other Ambulatory Visit: Payer: Self-pay

## 2021-02-02 ENCOUNTER — Other Ambulatory Visit: Payer: Self-pay

## 2021-02-02 ENCOUNTER — Emergency Department (HOSPITAL_COMMUNITY)
Admission: EM | Admit: 2021-02-02 | Discharge: 2021-02-02 | Disposition: A | Discharge status: Home / Self Care | Payer: Medicaid Other | Attending: Emergency Medicine | Admitting: Emergency Medicine

## 2021-02-02 DIAGNOSIS — F17210 Nicotine dependence, cigarettes, uncomplicated: Secondary | ICD-10-CM | POA: Insufficient documentation

## 2021-02-02 DIAGNOSIS — I10 Essential (primary) hypertension: Secondary | ICD-10-CM | POA: Insufficient documentation

## 2021-02-02 DIAGNOSIS — R112 Nausea with vomiting, unspecified: Secondary | ICD-10-CM | POA: Insufficient documentation

## 2021-02-02 DIAGNOSIS — R1012 Left upper quadrant pain: Secondary | ICD-10-CM

## 2021-02-02 DIAGNOSIS — K219 Gastro-esophageal reflux disease without esophagitis: Secondary | ICD-10-CM | POA: Diagnosis not present

## 2021-02-02 LAB — CBC WITH DIFFERENTIAL/PLATELET
Abs Immature Granulocytes: 0.03 10*3/uL (ref 0.00–0.07)
Basophils Absolute: 0.1 10*3/uL (ref 0.0–0.1)
Basophils Relative: 2 %
Eosinophils Absolute: 0.1 10*3/uL (ref 0.0–0.5)
Eosinophils Relative: 2 %
HCT: 36.8 % — ABNORMAL LOW (ref 39.0–52.0)
Hemoglobin: 12 g/dL — ABNORMAL LOW (ref 13.0–17.0)
Immature Granulocytes: 1 %
Lymphocytes Relative: 40 %
Lymphs Abs: 2.6 10*3/uL (ref 0.7–4.0)
MCH: 26.1 pg (ref 26.0–34.0)
MCHC: 32.6 g/dL (ref 30.0–36.0)
MCV: 80.2 fL (ref 80.0–100.0)
Monocytes Absolute: 0.6 10*3/uL (ref 0.1–1.0)
Monocytes Relative: 9 %
Neutro Abs: 3 10*3/uL (ref 1.7–7.7)
Neutrophils Relative %: 46 %
Platelets: 382 10*3/uL (ref 150–400)
RBC: 4.59 MIL/uL (ref 4.22–5.81)
RDW: 22.8 % — ABNORMAL HIGH (ref 11.5–15.5)
WBC: 6.4 10*3/uL (ref 4.0–10.5)
nRBC: 0 % (ref 0.0–0.2)

## 2021-02-02 LAB — COMPREHENSIVE METABOLIC PANEL
ALT: 38 U/L (ref 0–44)
AST: 95 U/L — ABNORMAL HIGH (ref 15–41)
Albumin: 3.7 g/dL (ref 3.5–5.0)
Alkaline Phosphatase: 119 U/L (ref 38–126)
Anion gap: 14 (ref 5–15)
BUN: 7 mg/dL (ref 6–20)
CO2: 21 mmol/L — ABNORMAL LOW (ref 22–32)
Calcium: 9.2 mg/dL (ref 8.9–10.3)
Chloride: 105 mmol/L (ref 98–111)
Creatinine, Ser: 0.96 mg/dL (ref 0.61–1.24)
GFR, Estimated: >60 mL/min (ref >60)
Glucose, Bld: 122 mg/dL — ABNORMAL HIGH (ref 70–99)
Potassium: 4 mmol/L (ref 3.5–5.1)
Sodium: 140 mmol/L (ref 135–145)
Total Bilirubin: 0.1 mg/dL — ABNORMAL LOW (ref 0.3–1.2)
Total Protein: 7.7 g/dL (ref 6.5–8.1)

## 2021-02-02 LAB — LIPASE, BLOOD: Lipase: 21 U/L (ref 11–51)

## 2021-02-02 MED ORDER — FENTANYL CITRATE (PF) 100 MCG/2ML IJ SOLN
50.0000 ug | Freq: Once | INTRAMUSCULAR | Status: AC
  Administered 2021-02-02: 50 ug via INTRAVENOUS
  Filled 2021-02-02: qty 2

## 2021-02-02 MED ORDER — LACTATED RINGERS IV BOLUS
1000.0000 mL | Freq: Once | INTRAVENOUS | Status: AC
  Administered 2021-02-02: 1000 mL via INTRAVENOUS

## 2021-02-02 MED ORDER — PANTOPRAZOLE SODIUM 40 MG IV SOLR
40.0000 mg | Freq: Once | INTRAVENOUS | Status: AC
  Administered 2021-02-02: 40 mg via INTRAVENOUS
  Filled 2021-02-02: qty 40

## 2021-02-02 MED ORDER — FAMOTIDINE IN NACL 20-0.9 MG/50ML-% IV SOLN
20.0000 mg | Freq: Once | INTRAVENOUS | Status: AC
  Administered 2021-02-02: 20 mg via INTRAVENOUS
  Filled 2021-02-02: qty 50

## 2021-02-02 MED ORDER — KETOROLAC TROMETHAMINE 15 MG/ML IJ SOLN
15.0000 mg | Freq: Once | INTRAMUSCULAR | Status: AC
  Administered 2021-02-02: 15 mg via INTRAVENOUS
  Filled 2021-02-02: qty 1

## 2021-02-02 MED ORDER — METOCLOPRAMIDE HCL 5 MG/ML IJ SOLN
10.0000 mg | INTRAMUSCULAR | Status: AC
  Administered 2021-02-02: 10 mg via INTRAVENOUS
  Filled 2021-02-02: qty 2

## 2021-02-02 MED ORDER — LIDOCAINE VISCOUS HCL 2 % MT SOLN
15.0000 mL | Freq: Once | OROMUCOSAL | Status: AC
  Administered 2021-02-02: 15 mL via ORAL
  Filled 2021-02-02: qty 15

## 2021-02-02 MED ORDER — ALUM & MAG HYDROXIDE-SIMETH 200-200-20 MG/5ML PO SUSP
30.0000 mL | Freq: Once | ORAL | Status: AC
  Administered 2021-02-02: 30 mL via ORAL
  Filled 2021-02-02: qty 30

## 2021-02-02 MED ORDER — OXYCODONE HCL 5 MG PO TABS
5.0000 mg | ORAL_TABLET | Freq: Once | ORAL | Status: AC
  Administered 2021-02-02: 5 mg via ORAL
  Filled 2021-02-02: qty 1

## 2021-02-02 NOTE — ED Provider Notes (Signed)
[facility] DEPT Provider Note   CSN: [id number] Arrival date & time: 02/02/21  0034     History Chief Complaint  Patient presents with  . Abdominal Pain    Brent Rogers is a 55 y.o. male.   55 year old male with a history of chronic pancreatitis status post Whipple procedure, hypertension, alcohol abuse presents to the emergency department for left upper quadrant abdominal pain which has been present over the past few days.  It is constant and feels similar to his prior exacerbations of pancreatitis.  Reports associated nausea and vomiting.  Has had 6 episodes of bright yellow emesis in the past 24 hours.  Is having normal bowel movements.  No hematemesis, fevers.  Took Tylenol for his pain without relief.  Does endorse drinking a 12 ounce beer at 9 AM.       Past Medical History:  Diagnosis Date  . Benign tumor of endocrine pancreas   . Chronic pancreatitis ([place])    S/P Whipple  . Foot ulcer with fat layer exposed ([place])   . Hypertension   . Migraine    "@ least once/month" (11/12/2015)  . Pancreatic abnormality    CT  shows mass  . Pneumonia 11/12/2015  . Scoliosis     Patient Active Problem List   Diagnosis Date Noted  . Flank pain 08/31/2020  . Abdominal pain 08/30/2020  . 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 Chronic pain syndrome 06/07/2019  . Depression 06/07/2019  . Chronic pancreatitis ([place]) 05/01/2017  . Foot callus 12/25/2015  . Onychomycosis of toenail 12/25/2015  . Fatty liver 11/12/2015  . GERD (gastroesophageal reflux disease) 11/12/2015  . HTN (hypertension) 07/10/2015  . S/P cholecystectomy 07/10/2015  . Tobacco abuse 12/26/2014  . Protein-calorie malnutrition, severe ([place]) 11/20/2014  . Recurrent pancreatitis 06/06/2014  . Exocrine pancreatic insufficiency ([place]) 12/23/2013    Past Surgical History:  Procedure Laterality Date  . BACK SURGERY    . EUS N/A 09/21/2013   Procedure: ESOPHAGEAL  ENDOSCOPIC ULTRASOUND (EUS) RADIAL;  Surgeon: Beryle Beams, MD;  [facility];  Service: Endoscopy;  Laterality: N/A;  . EUS N/A 09/30/2013   Procedure: UPPER ENDOSCOPIC ULTRASOUND (EUS) LINEAR;  Surgeon: Beryle Beams, MD;  [facility];  Service: Endoscopy;  Laterality: N/A;  . FINE NEEDLE ASPIRATION N/A 09/21/2013   Procedure: FINE NEEDLE ASPIRATION (FNA) LINEAR;  Surgeon: Beryle Beams, MD;  [facility];  Service: Endoscopy;  Laterality: N/A;  . FINGER FRACTURE SURGERY Left 1995   5th digit  . FRACTURE SURGERY    . KNEE ARTHROSCOPY Bilateral 1987-1989   right-left  . LAPAROSCOPY N/A 12/01/2013   Procedure: LAPAROSCOPY DIAGNOSTIC PANCREATICODUODENECTOMY WITH BILIARY AND PANCREATIC STENTS;  Surgeon: Stark Klein, MD;  Location[facility];  Service: General;  Laterality: N/A;  . LUMBAR [place] SURGERY  1990's  . WHIPPLE PROCEDURE  12/01/2013       Family History  Problem Relation Age of Onset  . Cancer Mother        unsure  . COPD Mother   . Hypertension Mother   . Hypertension Father   . COPD Father   . CAD Sister        possible has stent per pt    Social History   Tobacco Use  . Smoking status: Current Every Day Smoker    Packs/day: 0.25    Years: 30.00    Pack years: 7.50    Types: Cigarettes  . Smokeless tobacco: Former Systems developer  Types: Snuff  . Tobacco comment: "used snuff 10-15 years in my 20s-30s"  Vaping Use  . Vaping Use: Never used  Substance Use Topics  . Alcohol use: Yes    Alcohol/week: 4.0 standard drinks    Types: 4 Cans of beer per week    Comment: weekly  . Drug use: No    Home Medications Prior to Admission medications   Medication Sig Start Date End Date Taking? Authorizing Provider  bismuth subsalicylate (PEPTO BISMOL) 262 MG/15ML suspension Take 30 mLs by mouth every 6 (six) hours as needed for indigestion or diarrhea or loose stools.    [provider]  buPROPion (WELLBUTRIN SR) 100 MG 12 hr tablet Take 1  tablet (100 mg total) by mouth 2 (two) times daily. 06/22/20   Fulp, Cammie, MD  dicyclomine (BENTYL) 20 MG tablet TAKE 1 TABLET BY MOUTH UP TO 4 TIMES PER DAY AS NEEDED FOR ABDOMINAL PAIN Patient taking differently: Take 20 mg by mouth 4 (four) times daily as needed for spasms (abdominal pain).  06/22/20 06/22/21  Fulp, Cammie, MD  ferrous sulfate 325 (65 FE) MG tablet Take 1 tablet (325 mg total) by mouth daily. 09/01/20 10/01/20  Cherylann Ratel A, DO  ondansetron (ZOFRAN ODT) 4 MG disintegrating tablet Take 1 tablet (4 mg total) by mouth every 8 (eight) hours as needed for nausea or vomiting. 01/19/21   Montine Circle, PA-C  oxyCODONE-acetaminophen (PERCOCET) 5-325 MG tablet Take 1 tablet by mouth every 4 (four) hours as needed. 01/19/21   Montine Circle, PA-C  Pancrelipase, Lip-Prot-Amyl, (CREON) 24000-76000 units CPEP Take 1 capsule (24,000 Units total) by mouth with breakfast, with lunch, and with evening meal. 06/22/20   Fulp, Cammie, MD  pantoprazole (PROTONIX) 40 MG tablet TAKE 1 TABLET (40 MG TOTAL) BY MOUTH DAILY. 09/01/20 09/01/21  Cherylann Ratel A, DO  promethazine (PHENERGAN) 25 MG suppository PLACE 1 SUPPOSITORY (25 MG TOTAL) RECTALLY EVERY 6 (SIX) HOURS AS NEEDED FOR NAUSEA OR VOMITING. 12/08/20 12/08/21  Deno Etienne, DO    Allergies    Patient has no known allergies.  Review of Systems   Review of Systems  Ten systems reviewed and are negative for acute change, except as noted in the HPI.    Physical Exam Updated Vital Signs BP (!) 122/96   Pulse 86   Temp 100 F (37.8 C) (Oral)   Resp 18   Ht 6\' 3"  (1.905 m)   Wt 70.3 kg   SpO2 98%   BMI 19.37 kg/m   Physical Exam Vitals and nursing note reviewed.  Constitutional:      General: He is not in acute distress.    Appearance: He is well-developed. He is not diaphoretic.     Comments: Nontoxic appearing and in NAD  HENT:     Head: Normocephalic and atraumatic.  Eyes:     General: No scleral icterus.    Conjunctiva/sclera:  Conjunctivae normal.  Cardiovascular:     Rate and Rhythm: Normal rate and regular rhythm.     Pulses: Normal pulses.  Pulmonary:     Effort: Pulmonary effort is normal. No respiratory distress.  Abdominal:     General: There is no distension.     Palpations: There is no mass.     Tenderness: There is abdominal tenderness.     Comments: LUQ TTP with voluntary guarding. Abdomen soft, nondistended. No peritoneal signs.  Musculoskeletal:        General: Normal range of motion.     Cervical back:  Normal range of motion.  Skin:    General: Skin is warm and dry.     Coloration: Skin is not pale.     Findings: No erythema or rash.     Comments: Corns and calluses to plantar feet b/l; no associated abscess or cellulitis.  Neurological:     Mental Status: He is alert and oriented to person, place, and time.     Coordination: Coordination normal.  Psychiatric:        Behavior: Behavior normal.     ED Results / Procedures / Treatments   Labs (all labs ordered are listed, but only abnormal results are displayed) Labs Reviewed  CBC WITH DIFFERENTIAL/PLATELET - Abnormal; Notable for the following components:      Result Value   Hemoglobin 12.0 (*)    HCT 36.8 (*)    RDW 22.8 (*)    All other components within normal limits  COMPREHENSIVE METABOLIC PANEL  LIPASE, BLOOD    EKG None  Radiology No results found.  Procedures Procedures   Medications Ordered in ED Medications  metoCLOPramide (REGLAN) injection 10 mg (10 mg Intravenous Given 02/02/21 0325)  famotidine (PEPCID) IVPB 20 mg premix (0 mg Intravenous Stopped 02/02/21 0407)  pantoprazole (PROTONIX) injection 40 mg (40 mg Intravenous Given 02/02/21 0326)  fentaNYL (SUBLIMAZE) injection 50 mcg (50 mcg Intravenous Given 02/02/21 0326)  alum & mag hydroxide-simeth (MAALOX/MYLANTA) 200-200-20 MG/5ML suspension 30 mL (30 mLs Oral Given 02/02/21 0438)    And  lidocaine (XYLOCAINE) 2 % viscous mouth solution 15 mL (15 mLs Oral Given  02/02/21 0438)  ketorolac (TORADOL) 15 MG/ML injection 15 mg (15 mg Intravenous Given 02/02/21 0438)  lactated ringers bolus 1,000 mL (0 mLs Intravenous Stopped 02/02/21 0623)  oxyCODONE (Oxy IR/ROXICODONE) immediate release tablet 5 mg (5 mg Oral Given 02/02/21 0623)  fentaNYL (SUBLIMAZE) injection 50 mcg (50 mcg Intravenous Given 02/02/21 4854)    ED Course  I have reviewed the triage vital signs and the nursing notes.  Pertinent labs & imaging results that were available during my care of the patient were reviewed by me and considered in my medical decision making (see chart for details).    MDM Rules/Calculators/A&P                          55 year old male presents to the emergency department for evaluation of left upper quadrant abdominal pain.  He has a history of chronic abdominal pain as well as pancreatitis.  States that this feels similar to pain he has had in the past.  Continues consuming alcohol despite recommendations to discontinue drinking.  His work-up in the emergency department is stable.  Lipase reassuring.  He has had improvement in his pain with IV medications and fluids.  Nausea and vomiting well controlled with antiemetics.  Currently tolerating PO fluids and medications.  Plan for discharge and follow-up with his primary care doctor regarding his recurrent abdominal pain.  Return precautions discussed and provided. Patient discharged in stable condition with no unaddressed concerns.   Final Clinical Impression(s) / ED Diagnoses Final diagnoses:  Left upper quadrant abdominal pain    Rx / DC Orders ED Discharge Orders    None       Antonietta Breach, PA-C 02/26/21 0253    Shanon Rosser, MD 02/26/21 0630

## 2021-02-02 NOTE — Discharge Instructions (Signed)
Refrain from drinking alcohol as this will exacerbate your symptoms.  Follow-up with your primary care doctor for recheck.

## 2021-02-02 NOTE — ED Notes (Signed)
Patient was given water for fluid challenge

## 2021-02-02 NOTE — ED Triage Notes (Addendum)
Patient reports left sided abdominal pain with N/V. Hx of pancreatitis. Pain is 10/10. 12oz of beer at 9am. Also complains of blisters on the bottom of his feet.

## 2021-02-02 NOTE — ED Notes (Signed)
Patient is eating graham crackers and drinking water.

## 2021-03-05 ENCOUNTER — Emergency Department (HOSPITAL_COMMUNITY): Payer: Medicaid Other

## 2021-03-05 ENCOUNTER — Inpatient Hospital Stay (HOSPITAL_COMMUNITY)
Admission: EM | Admit: 2021-03-05 | Discharge: 2021-03-13 | DRG: 640 | Disposition: A | Discharge status: Home / Self Care | Payer: Medicaid Other | Attending: Family Medicine | Admitting: Family Medicine

## 2021-03-05 ENCOUNTER — Encounter (HOSPITAL_COMMUNITY): Payer: Self-pay

## 2021-03-05 ENCOUNTER — Other Ambulatory Visit: Payer: Self-pay

## 2021-03-05 DIAGNOSIS — E860 Dehydration: Principal | ICD-10-CM | POA: Diagnosis present

## 2021-03-05 DIAGNOSIS — R300 Dysuria: Secondary | ICD-10-CM | POA: Diagnosis present

## 2021-03-05 DIAGNOSIS — Z79899 Other long term (current) drug therapy: Secondary | ICD-10-CM

## 2021-03-05 DIAGNOSIS — Z825 Family history of asthma and other chronic lower respiratory diseases: Secondary | ICD-10-CM

## 2021-03-05 DIAGNOSIS — R0609 Other forms of dyspnea: Secondary | ICD-10-CM | POA: Diagnosis present

## 2021-03-05 DIAGNOSIS — Z8719 Personal history of other diseases of the digestive system: Secondary | ICD-10-CM

## 2021-03-05 DIAGNOSIS — F32A Depression, unspecified: Secondary | ICD-10-CM | POA: Diagnosis present

## 2021-03-05 DIAGNOSIS — R309 Painful micturition, unspecified: Secondary | ICD-10-CM | POA: Diagnosis present

## 2021-03-05 DIAGNOSIS — Z8249 Family history of ischemic heart disease and other diseases of the circulatory system: Secondary | ICD-10-CM

## 2021-03-05 DIAGNOSIS — D500 Iron deficiency anemia secondary to blood loss (chronic): Secondary | ICD-10-CM | POA: Diagnosis present

## 2021-03-05 DIAGNOSIS — K921 Melena: Secondary | ICD-10-CM

## 2021-03-05 DIAGNOSIS — K5730 Diverticulosis of large intestine without perforation or abscess without bleeding: Secondary | ICD-10-CM | POA: Diagnosis present

## 2021-03-05 DIAGNOSIS — R0789 Other chest pain: Secondary | ICD-10-CM | POA: Diagnosis present

## 2021-03-05 DIAGNOSIS — K219 Gastro-esophageal reflux disease without esophagitis: Secondary | ICD-10-CM | POA: Diagnosis present

## 2021-03-05 DIAGNOSIS — F1010 Alcohol abuse, uncomplicated: Secondary | ICD-10-CM | POA: Diagnosis present

## 2021-03-05 DIAGNOSIS — Q394 Esophageal web: Secondary | ICD-10-CM

## 2021-03-05 DIAGNOSIS — K8681 Exocrine pancreatic insufficiency: Secondary | ICD-10-CM | POA: Diagnosis present

## 2021-03-05 DIAGNOSIS — G894 Chronic pain syndrome: Secondary | ICD-10-CM | POA: Diagnosis present

## 2021-03-05 DIAGNOSIS — F419 Anxiety disorder, unspecified: Secondary | ICD-10-CM | POA: Diagnosis present

## 2021-03-05 DIAGNOSIS — K760 Fatty (change of) liver, not elsewhere classified: Secondary | ICD-10-CM | POA: Diagnosis present

## 2021-03-05 DIAGNOSIS — K5909 Other constipation: Secondary | ICD-10-CM | POA: Diagnosis present

## 2021-03-05 DIAGNOSIS — Z681 Body mass index (BMI) 19 or less, adult: Secondary | ICD-10-CM

## 2021-03-05 DIAGNOSIS — K222 Esophageal obstruction: Secondary | ICD-10-CM | POA: Diagnosis present

## 2021-03-05 DIAGNOSIS — R55 Syncope and collapse: Secondary | ICD-10-CM | POA: Diagnosis present

## 2021-03-05 DIAGNOSIS — K861 Other chronic pancreatitis: Secondary | ICD-10-CM | POA: Diagnosis present

## 2021-03-05 DIAGNOSIS — I2510 Atherosclerotic heart disease of native coronary artery without angina pectoris: Secondary | ICD-10-CM

## 2021-03-05 DIAGNOSIS — S1120XA Unspecified open wound of pharynx and cervical esophagus, initial encounter: Secondary | ICD-10-CM

## 2021-03-05 DIAGNOSIS — F329 Major depressive disorder, single episode, unspecified: Secondary | ICD-10-CM | POA: Diagnosis present

## 2021-03-05 DIAGNOSIS — R001 Bradycardia, unspecified: Secondary | ICD-10-CM

## 2021-03-05 DIAGNOSIS — K922 Gastrointestinal hemorrhage, unspecified: Secondary | ICD-10-CM | POA: Diagnosis present

## 2021-03-05 DIAGNOSIS — F17210 Nicotine dependence, cigarettes, uncomplicated: Secondary | ICD-10-CM | POA: Diagnosis present

## 2021-03-05 DIAGNOSIS — D509 Iron deficiency anemia, unspecified: Secondary | ICD-10-CM

## 2021-03-05 DIAGNOSIS — R070 Pain in throat: Secondary | ICD-10-CM | POA: Diagnosis present

## 2021-03-05 DIAGNOSIS — I10 Essential (primary) hypertension: Secondary | ICD-10-CM | POA: Diagnosis present

## 2021-03-05 DIAGNOSIS — Z90411 Acquired partial absence of pancreas: Secondary | ICD-10-CM

## 2021-03-05 DIAGNOSIS — Z20822 Contact with and (suspected) exposure to covid-19: Secondary | ICD-10-CM | POA: Diagnosis present

## 2021-03-05 DIAGNOSIS — R109 Unspecified abdominal pain: Secondary | ICD-10-CM

## 2021-03-05 LAB — CBC WITH DIFFERENTIAL/PLATELET
Abs Immature Granulocytes: 0.02 10*3/uL (ref 0.00–0.07)
Basophils Absolute: 0.1 10*3/uL (ref 0.0–0.1)
Basophils Relative: 1 %
Eosinophils Absolute: 0.1 10*3/uL (ref 0.0–0.5)
Eosinophils Relative: 2 %
HCT: 32.6 % — ABNORMAL LOW (ref 39.0–52.0)
Hemoglobin: 10.4 g/dL — ABNORMAL LOW (ref 13.0–17.0)
Immature Granulocytes: 0 %
Lymphocytes Relative: 31 %
Lymphs Abs: 1.9 10*3/uL (ref 0.7–4.0)
MCH: 26.4 pg (ref 26.0–34.0)
MCHC: 31.9 g/dL (ref 30.0–36.0)
MCV: 82.7 fL (ref 80.0–100.0)
Monocytes Absolute: 0.7 10*3/uL (ref 0.1–1.0)
Monocytes Relative: 11 %
Neutro Abs: 3.4 10*3/uL (ref 1.7–7.7)
Neutrophils Relative %: 55 %
Platelets: 348 10*3/uL (ref 150–400)
RBC: 3.94 MIL/uL — ABNORMAL LOW (ref 4.22–5.81)
RDW: 20.2 % — ABNORMAL HIGH (ref 11.5–15.5)
WBC: 6.2 10*3/uL (ref 4.0–10.5)
nRBC: 0 % (ref 0.0–0.2)

## 2021-03-05 LAB — URINALYSIS, DIPSTICK ONLY
Bilirubin Urine: NEGATIVE
Glucose, UA: NEGATIVE mg/dL
Hgb urine dipstick: NEGATIVE
Ketones, ur: NEGATIVE mg/dL
Leukocytes,Ua: NEGATIVE
Nitrite: NEGATIVE
Protein, ur: NEGATIVE mg/dL
Specific Gravity, Urine: 1.019 (ref 1.005–1.030)
pH: 6 (ref 5.0–8.0)

## 2021-03-05 LAB — COMPREHENSIVE METABOLIC PANEL
ALT: 15 U/L (ref 0–44)
AST: 40 U/L (ref 15–41)
Albumin: 3.1 g/dL — ABNORMAL LOW (ref 3.5–5.0)
Alkaline Phosphatase: 85 U/L (ref 38–126)
Anion gap: 10 (ref 5–15)
BUN: 5 mg/dL — ABNORMAL LOW (ref 6–20)
CO2: 22 mmol/L (ref 22–32)
Calcium: 8.4 mg/dL — ABNORMAL LOW (ref 8.9–10.3)
Chloride: 106 mmol/L (ref 98–111)
Creatinine, Ser: 0.72 mg/dL (ref 0.61–1.24)
GFR, Estimated: >60 mL/min (ref >60)
Glucose, Bld: 105 mg/dL — ABNORMAL HIGH (ref 70–99)
Potassium: 3.8 mmol/L (ref 3.5–5.1)
Sodium: 138 mmol/L (ref 135–145)
Total Bilirubin: 0.4 mg/dL (ref 0.3–1.2)
Total Protein: 6 g/dL — ABNORMAL LOW (ref 6.5–8.1)

## 2021-03-05 LAB — TROPONIN I (HIGH SENSITIVITY): Troponin I (High Sensitivity): 5 ng/L (ref <18)

## 2021-03-05 LAB — LIPASE, BLOOD: Lipase: 18 U/L (ref 11–51)

## 2021-03-05 MED ORDER — ONDANSETRON 4 MG PO TBDP
4.0000 mg | ORAL_TABLET | Freq: Once | ORAL | Status: DC
  Filled 2021-03-05: qty 1

## 2021-03-05 NOTE — ED Triage Notes (Signed)
Patient arrives with abdominal pain with some associated chest pain, nausea, vomiting, and diarrhea, patient reports hx of pancreatitis.

## 2021-03-05 NOTE — ED Provider Notes (Signed)
Emergency Medicine Provider Triage Evaluation Note  Brent Rogers , a 55 y.o. male  was evaluated in triage.  Pt complains of left upper and epigastric abdominal pain with associated nausea, vomiting and diarrhea.  Patient reports this is consistent with previous episodes of pancreatitis.  He reports that this afternoon he developed some chest pain and had a syncopal episode.  Reports that he often has chest pain with these episodes but tonight is worse than others..  Review of Systems  Positive: Nausea, vomiting, diarrhea, chest pain, syncope Negative: Fever, chills, dysuria  Physical Exam  BP (!) 135/100   Pulse 84   Temp 98.1 F (36.7 C) (Oral)   Resp 16   Ht 6\' 3"  (1.905 m)   Wt 70.3 kg   SpO2 100%   BMI 19.37 kg/m  Gen:   Awake, no distress   Resp:  Normal effort  MSK:   Moves extremities without difficulty  Other:  Exquisite tenderness in the left upper quadrant  Medical Decision Making  Medically screening exam initiated at 10:28 PM.  Appropriate orders placed.  CASSON CATENA was informed that the remainder of the evaluation will be completed by another provider, this initial triage assessment does not replace that evaluation, and the importance of remaining in the ED until their evaluation is complete.  History of pancreatitis.  Chest pain and syncope today.  Concern for dehydration.  Will give initiated.   Romano Stigger, Gwenlyn Perking 03/05/21 2231    Lucrezia Starch, MD 03/07/21 [phone]

## 2021-03-06 ENCOUNTER — Emergency Department (HOSPITAL_COMMUNITY): Payer: Medicaid Other

## 2021-03-06 DIAGNOSIS — R55 Syncope and collapse: Secondary | ICD-10-CM | POA: Diagnosis present

## 2021-03-06 LAB — COMPREHENSIVE METABOLIC PANEL
ALT: 14 U/L (ref 0–44)
AST: 38 U/L (ref 15–41)
Albumin: 2.8 g/dL — ABNORMAL LOW (ref 3.5–5.0)
Alkaline Phosphatase: 80 U/L (ref 38–126)
Anion gap: 10 (ref 5–15)
BUN: 5 mg/dL — ABNORMAL LOW (ref 6–20)
CO2: 22 mmol/L (ref 22–32)
Calcium: 8.2 mg/dL — ABNORMAL LOW (ref 8.9–10.3)
Chloride: 105 mmol/L (ref 98–111)
Creatinine, Ser: 0.75 mg/dL (ref 0.61–1.24)
GFR, Estimated: >60 mL/min (ref >60)
Glucose, Bld: 179 mg/dL — ABNORMAL HIGH (ref 70–99)
Potassium: 3.6 mmol/L (ref 3.5–5.1)
Sodium: 137 mmol/L (ref 135–145)
Total Bilirubin: 0.7 mg/dL (ref 0.3–1.2)
Total Protein: 5.4 g/dL — ABNORMAL LOW (ref 6.5–8.1)

## 2021-03-06 LAB — CBC
HCT: 29.2 % — ABNORMAL LOW (ref 39.0–52.0)
Hemoglobin: 9.1 g/dL — ABNORMAL LOW (ref 13.0–17.0)
MCH: 26.8 pg (ref 26.0–34.0)
MCHC: 31.2 g/dL (ref 30.0–36.0)
MCV: 86.1 fL (ref 80.0–100.0)
Platelets: 267 10*3/uL (ref 150–400)
RBC: 3.39 MIL/uL — ABNORMAL LOW (ref 4.22–5.81)
RDW: 20.7 % — ABNORMAL HIGH (ref 11.5–15.5)
WBC: 7.4 10*3/uL (ref 4.0–10.5)
nRBC: 0 % (ref 0.0–0.2)

## 2021-03-06 LAB — LIPID PANEL
Cholesterol: 123 mg/dL (ref 0–200)
HDL: 64 mg/dL (ref >40)
LDL Cholesterol: 48 mg/dL (ref 0–99)
Total CHOL/HDL Ratio: 1.9 RATIO
Triglycerides: 55 mg/dL (ref <150)
VLDL: 11 mg/dL (ref 0–40)

## 2021-03-06 LAB — IRON AND TIBC
Iron: 194 ug/dL — ABNORMAL HIGH (ref 45–182)
Saturation Ratios: 59 % — ABNORMAL HIGH (ref 17.9–39.5)
TIBC: 328 ug/dL (ref 250–450)
UIBC: 134 ug/dL

## 2021-03-06 LAB — BRAIN NATRIURETIC PEPTIDE: B Natriuretic Peptide: 144.5 pg/mL — ABNORMAL HIGH (ref 0.0–100.0)

## 2021-03-06 LAB — TSH: TSH: 2.96 u[IU]/mL (ref 0.350–4.500)

## 2021-03-06 LAB — TROPONIN I (HIGH SENSITIVITY): Troponin I (High Sensitivity): 4 ng/L (ref <18)

## 2021-03-06 LAB — MAGNESIUM: Magnesium: 1.6 mg/dL — ABNORMAL LOW (ref 1.7–2.4)

## 2021-03-06 MED ORDER — ENOXAPARIN SODIUM 40 MG/0.4ML IJ SOSY
40.0000 mg | PREFILLED_SYRINGE | INTRAMUSCULAR | Status: AC
  Administered 2021-03-06 – 2021-03-09 (×4): 40 mg via SUBCUTANEOUS
  Filled 2021-03-06 (×4): qty 0.4

## 2021-03-06 MED ORDER — FAMOTIDINE IN NACL 20-0.9 MG/50ML-% IV SOLN
20.0000 mg | Freq: Once | INTRAVENOUS | Status: AC
  Administered 2021-03-06: 20 mg via INTRAVENOUS
  Filled 2021-03-06: qty 50

## 2021-03-06 MED ORDER — OXYCODONE HCL 5 MG PO TABS
5.0000 mg | ORAL_TABLET | Freq: Once | ORAL | Status: AC
  Administered 2021-03-06: 5 mg via ORAL
  Filled 2021-03-06: qty 1

## 2021-03-06 MED ORDER — DICYCLOMINE HCL 20 MG PO TABS
20.0000 mg | ORAL_TABLET | Freq: Four times a day (QID) | ORAL | Status: DC | PRN
  Administered 2021-03-08 – 2021-03-10 (×5): 20 mg via ORAL
  Filled 2021-03-06 (×6): qty 1

## 2021-03-06 MED ORDER — NICOTINE 21 MG/24HR TD PT24
21.0000 mg | MEDICATED_PATCH | Freq: Every day | TRANSDERMAL | Status: DC
  Filled 2021-03-06 (×3): qty 1

## 2021-03-06 MED ORDER — ALUM & MAG HYDROXIDE-SIMETH 200-200-20 MG/5ML PO SUSP
30.0000 mL | Freq: Once | ORAL | Status: AC
  Administered 2021-03-06: 30 mL via ORAL
  Filled 2021-03-06: qty 30

## 2021-03-06 MED ORDER — PANTOPRAZOLE SODIUM 40 MG PO TBEC
40.0000 mg | DELAYED_RELEASE_TABLET | Freq: Every day | ORAL | Status: DC
  Administered 2021-03-06 – 2021-03-13 (×7): 40 mg via ORAL
  Filled 2021-03-06 (×7): qty 1

## 2021-03-06 MED ORDER — SODIUM CHLORIDE 0.9 % IV BOLUS: 1000.0000 mL | Freq: Once | INTRAVENOUS | Status: DC

## 2021-03-06 MED ORDER — FENTANYL CITRATE (PF) 100 MCG/2ML IJ SOLN
50.0000 ug | Freq: Once | INTRAMUSCULAR | Status: AC
  Administered 2021-03-06: 50 ug via INTRAVENOUS
  Filled 2021-03-06: qty 2

## 2021-03-06 MED ORDER — IOHEXOL 300 MG/ML  SOLN
80.0000 mL | Freq: Once | INTRAMUSCULAR | Status: AC | PRN
  Administered 2021-03-06: 80 mL via INTRAVENOUS

## 2021-03-06 MED ORDER — PANCRELIPASE (LIP-PROT-AMYL) 12000-38000 UNITS PO CPEP
24000.0000 [IU] | ORAL_CAPSULE | Freq: Three times a day (TID) | ORAL | Status: DC
  Administered 2021-03-07 – 2021-03-13 (×14): 24000 [IU] via ORAL
  Filled 2021-03-06 (×21): qty 2

## 2021-03-06 MED ORDER — SODIUM CHLORIDE 0.9 % IV BOLUS
1000.0000 mL | Freq: Once | INTRAVENOUS | Status: AC
  Administered 2021-03-06: 1000 mL via INTRAVENOUS

## 2021-03-06 MED ORDER — LIDOCAINE VISCOUS HCL 2 % MT SOLN
15.0000 mL | Freq: Once | OROMUCOSAL | Status: AC
  Administered 2021-03-06: 15 mL via ORAL
  Filled 2021-03-06: qty 15

## 2021-03-06 MED ORDER — PANTOPRAZOLE SODIUM 40 MG IV SOLR
40.0000 mg | Freq: Once | INTRAVENOUS | Status: AC
  Administered 2021-03-06: 40 mg via INTRAVENOUS
  Filled 2021-03-06: qty 40

## 2021-03-06 NOTE — ED Notes (Signed)
RN attempted to get cbc. 2 unsuccessful attempts. Will call phlebotomy

## 2021-03-06 NOTE — H&P (Addendum)
Brent Rogers Admission History and Physical Service Pager: [phone]  Patient name: Brent Rogers Medical record number: [id number] Date of birth: 1965/11/08 Age: 55 y.o. Gender: male  Primary Care Provider: Ladell Pier, MD Consultants: None Code Status: Full Preferred Emergency Contact: Peter Congo (aunt) [phone]  Chief Complaint: Abdominal pain  Assessment and Plan: Brent Rogers is a 55 y.o. male presenting with abdominal pain . PMH is significant for chronic pancreatitis, Whipple's procedure, HTN, Migraine, Scoliosis, fatty liver disease, normocytic anemia,   Acute on chronic Abdominal pain I Hx of chronic pancreatitis s/p Whipple's (2015) Patient presented to ED with epigastric and left upper abdominal pain, nausea, vomiting and diarrhea x 5 days.  Notes similar pain to previous episodes of pancreatitis which is sharp, constant, nonradiating and worsened with eating and there are no elevating factors.  Also, complains of nausea and vomiting and had an episode of passing out at home.  Denies chest pain, shortness of breath or to syncope episode.  Afebrile, RR 16, HR 84, BP 135/100 on presentation, breathing normally on room air.  Troponins 5 >4, lipase WNL, urine analysis normal, hemoglobin 10.4, no leukocytosis or thrombocytopenia.  Chest x-ray does not show any active cardiopulmonary disease.  CT scan abdomen does not show acute findings in abdomen or pelvis, prior Whipple procedure and no complicating features.  In ED ACS ruled out and suspecting pain is acute on chronic abdominal pain due to chronic pancreatitis.  Received p.o. oxycodone, Protonix, iv fentanyl x 2 doses.  On physical exam left lower abdominal pain radiating to back.  Patient also complains of food being stuck in his epigastrium, tries liquid to push it down but still feels it is stuck and would benefit from GI consult in a.m.  Differentials considered include globus sensation,  esophageal spasm, webs and Schatzki rings. Denies any endoscopy or colonoscopy in past.   Home meds: Bentyl 20 mg q4h PRN, Zofran 4 mg q8 PRN, Pancrelipase(24,000 units), phenergan 25 mg PR q6 PRN, pepto bismol q6h -- Admitting to FPTS, attending Dr. Gwendlyn Deutscher -- Vitals per floor -- Orthostatics vitals -- PT eval and treat -- OT eval and treat -- CBC in am -- CMP in am -- c/w Bentyl 20 mg q4h PRN for spasms -- c/w Creon -- c/w Zofran 4 mg once  -- Consider GI consult in am -- Tramadol 50 mg q12h prn  Syncope Patient reports 1 episode of syncope at home on Saturday before presenting to ED. He had multiple episodes of vomiting at home and passed out, does not remember hitting head.  Suspecting vasovagal syncope vs situational vs orthostatic hypotension (eithert due to Wellbutrin or GI loss) vs Cardiac.  It can be vasovagal syncope due to ongoing pain or orthostatic hypotension as he is taking Wellbutrin which might cause it and also he is having GI loss in the form of multiple episodes of vomiting.  Already ruled out ACS, troponins flat, EKG does not show any acute ST segment changes, but ordering echocardiogram to rule out any additional abnormality.  Also going to check urine drug screen and TSH to rule out other etiologies. -- f/u TSH -- f/u HbA1c -- f/u Echocardiogram -- PT eval and treat -- OT eval and treat -- f/u UDS -- Orthostatic vitals -- Telemonitoring  Iron Deficiency anemia Hbg 9.1, s/p 3L of IVF, so likely dilutional .  After repeat CBC, if hemoglobin is still low please consider either restarting home medication, IV iron or transfusion.  Home  meds: Ferrous sulphate.  Denies any blood in stool or urine. -- f/u Iron studies -- f/u ferritin -- c/w ferrous sulphate due to ongoing constipation.  Dysuria Patient complains of pain during urination along with pressure in his lower abdomen with the starting of urination but it gets relieved later.  Does not show any leukocytes,  hemoglobin, nitrites.  Most likely due to ongoing abdominal pain. -- Monitor closely  GERD Home meds: Protonix 40 mg Q day --c/w Protonix 40 mg Qday  HTN BP 138/99.Home meds: Amlodipine 5 mg in past, d/c in 12/21 due to controlled BP. -- Monitor closely -- Consider just restarting amlodipine 5 mg, if needed for hypertension  Tobacco use disorder Patient smokes cigarettes, but does not clarify the number --Denies nicotine patch at this time  Major depressive disorder Home meds: Bupropion --Hold Bupropion due to his syncope episode  Alcohol use disorder Notes last use 1 week ago, denies any cravings or withdrawal --CIWA q8h without Ativan    FEN/GI: Full liquid diet Prophylaxis: Lovenox 40 mg  Disposition: Med-tele  History of Present Illness:  Brent Rogers is a 55 y.o. male presenting with abdominal pain, nausea, vomiting that started last Saturday and he is unable to keep anything down orally.  He had 1 episode of passing out on Friday/Saturday.  Denies any aura, prodromal symptoms before passing out but does not remember for how long he was out of it and does not remember any head trauma.  Complains of pain and nausea and vomiting.  Also notes pain on urination that started 2 to 3 days ago, has had similar symptoms previously which is accompanied with pressure in lower abdomen when voiding.  Complains of chills, sweats at home but no fevers.  His last bowel movement was yesterday and it was hard his break and painful to defecate.  Took all his medication on last Friday.   Review Of Systems: Per HPI   Review of Systems  Constitutional: Positive for chills. Negative for fever.  Respiratory: Negative for shortness of breath.   Cardiovascular: Positive for chest pain. Negative for leg swelling.  Gastrointestinal: Positive for abdominal pain and constipation. Negative for blood in stool and diarrhea.  Genitourinary: Positive for dysuria. Negative for hematuria.   Neurological: Positive for dizziness, syncope, weakness and headaches. Negative for seizures.     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   Chronic pain syndrome 06/07/2019   Depression 06/07/2019   Chronic pancreatitis ([place]) 05/01/2017   Foot callus 12/25/2015   Onychomycosis of toenail 12/25/2015   Fatty liver 11/12/2015   GERD (gastroesophageal reflux disease) 11/12/2015   HTN (hypertension) 07/10/2015   S/P cholecystectomy 07/10/2015   Tobacco abuse 12/26/2014   Protein-calorie malnutrition, severe ([place]) 11/20/2014   Recurrent pancreatitis 06/06/2014   Exocrine pancreatic insufficiency ([place]) 12/23/2013    Past Medical History: Past Medical History:  Diagnosis Date   Benign tumor of endocrine pancreas    Chronic pancreatitis ([place])    S/P Whipple   Foot ulcer with fat layer exposed ([place])    Hypertension    Migraine    "@ least once/month" (11/12/2015)   Pancreatic abnormality    CT  shows mass   Pneumonia 11/12/2015   Scoliosis     Past Surgical History: Past Surgical History:  Procedure Laterality Date   BACK SURGERY     EUS  N/A 09/21/2013   Procedure: ESOPHAGEAL ENDOSCOPIC ULTRASOUND (EUS) RADIAL;  Surgeon: Beryle Beams, MD;  Location: [facility];  Service: Endoscopy;  Laterality: N/A;   EUS N/A 09/30/2013   Procedure: UPPER ENDOSCOPIC ULTRASOUND (EUS) LINEAR;  Surgeon: Beryle Beams, MD;  Location: [facility];  Service: Endoscopy;  Laterality: N/A;   FINE NEEDLE ASPIRATION N/A 09/21/2013   Procedure: FINE NEEDLE ASPIRATION (FNA) LINEAR;  Surgeon: Beryle Beams, MD;  Location: [facility];  Service: Endoscopy;  Laterality: N/A;   FINGER FRACTURE SURGERY Left 1995   5th digit   FRACTURE SURGERY     KNEE ARTHROSCOPY Bilateral 1987-1989   right-left   LAPAROSCOPY N/A 12/01/2013   Procedure: LAPAROSCOPY DIAGNOSTIC  PANCREATICODUODENECTOMY WITH BILIARY AND PANCREATIC STENTS;  Surgeon: Stark Klein, MD;  Location: [facility];  Service: General;  Laterality: N/A;   LUMBAR [place] SURGERY  1990's   WHIPPLE PROCEDURE  12/01/2013    Social History: Social History   Tobacco Use   Smoking status: Current Every Day Smoker    Packs/day: 0.25    Years: 30.00    Pack years: 7.50    Types: Cigarettes   Smokeless tobacco: Former Systems developer    Types: Snuff   Tobacco comment: "used snuff 10-15 years in my 20s-30s"  Vaping Use   Vaping Use: Never used  Substance Use Topics   Alcohol use: Yes    Alcohol/week: 4.0 standard drinks    Types: 4 Cans of beer per week    Comment: weekly   Drug use: No    Please also refer to relevant sections of EMR.  Family History: Family History  Problem Relation Age of Onset   Cancer Mother        unsure   COPD Mother    Hypertension Mother    Hypertension Father    COPD Father    CAD Sister        possible has stent per pt     Allergies and Medications: No Known Allergies No current facility-administered medications on file prior to encounter.   Current Outpatient Medications on File Prior to Encounter  Medication Sig Dispense Refill   bismuth subsalicylate (PEPTO BISMOL) 262 MG/15ML suspension Take 30 mLs by mouth every 6 (six) hours as needed for indigestion or diarrhea or loose stools.     buPROPion (WELLBUTRIN SR) 100 MG 12 hr tablet Take 1 tablet (100 mg total) by mouth 2 (two) times daily. 180 tablet 1   dicyclomine (BENTYL) 20 MG tablet TAKE 1 TABLET BY MOUTH UP TO 4 TIMES PER DAY AS NEEDED FOR ABDOMINAL PAIN (Patient taking differently: Take 20 mg by mouth 4 (four) times daily as needed for spasms (abdominal pain). ) 30 tablet 5   ferrous sulfate 325 (65 FE) MG tablet Take 1 tablet (325 mg total) by mouth daily. 30 tablet 0   ondansetron (ZOFRAN ODT) 4 MG disintegrating tablet Take 1 tablet (4 mg total) by mouth every 8 (eight) hours as needed for nausea or vomiting.  10 tablet 0   oxyCODONE-acetaminophen (PERCOCET) 5-325 MG tablet Take 1 tablet by mouth every 4 (four) hours as needed. 10 tablet 0   Pancrelipase, Lip-Prot-Amyl, (CREON) 24000-76000 units CPEP Take 1 capsule (24,000 Units total) by mouth with breakfast, with lunch, and with evening meal. 270 capsule 3   pantoprazole (PROTONIX) 40 MG tablet TAKE 1 TABLET (40 MG TOTAL) BY MOUTH DAILY. 30 tablet 0   promethazine (PHENERGAN) 25 MG suppository PLACE 1 SUPPOSITORY (25 MG TOTAL) RECTALLY  EVERY 6 (SIX) HOURS AS NEEDED FOR NAUSEA OR VOMITING. 12 suppository 0    Objective: BP (!) 139/94 (BP Location: Left Arm)   Pulse (!) 52   Temp 97.6 F (36.4 C) (Oral)   Resp 17   Ht 6\' 4"  (1.93 m)   Wt 133 lb 13.1 oz (60.7 kg)   SpO2 100%   BMI 16.29 kg/m  Exam: General: Thin appearing male, lying comfortably in bed, NAD HEENT: [place]/AT, PERRL Cardiovascular: Regular rate and rhythm Respiratory: Clear to Auscultation bilaterally Gastrointestinal: Nominal tenderness in epigastric area and left lower quadrant.  Guarding present, bowel sounds present MSK: Able to move all extremities well Derm: Warm and dry Neuro: AAO x 3 Psych: Dysphoric mood and affect  Labs and Imaging: CBC BMET  Recent Labs  Lab 03/06/21 1836  WBC 7.4  HGB 9.1*  HCT 29.2*  PLT 267   Recent Labs  Lab 03/06/21 1805  NA 137  K 3.6  CL 105  CO2 22  BUN 5*  CREATININE 0.75  GLUCOSE 179*  CALCIUM 8.2*     EKG: My own interpretation is normal sinus rhythm, no acute ST changes  CT ABDOMEN PELVIS W CONTRAST  Result Date: 03/06/2021 CLINICAL DATA:  Abdominal pain, nausea, vomiting EXAM: CT ABDOMEN AND PELVIS WITH CONTRAST TECHNIQUE: Multidetector CT imaging of the abdomen and pelvis was performed using the standard protocol following bolus administration of intravenous contrast. CONTRAST:  42mL OMNIPAQUE IOHEXOL 300 MG/ML  SOLN COMPARISON:  01/19/2021 FINDINGS: Lower chest: Lung bases are clear. No effusions. Heart is normal  size. Hepatobiliary: No focal liver abnormality is seen. Status post cholecystectomy. No biliary dilatation. Pancreas: No focal abnormality or ductal dilatation. Prior Whipple procedure. Spleen: No focal abnormality.  Normal size. Adrenals/Urinary Tract: Few scattered cysts bilaterally. No hydronephrosis or adrenal glands and urinary bladder unremarkable. Stomach/Bowel: Stomach, large and small bowel grossly unremarkable. Postoperative changes from prior Whipple procedure. Gastrojejunostomy unremarkable. Vascular/Lymphatic: Aortic atherosclerosis. No evidence of aneurysm or adenopathy. Reproductive: No visible focal abnormality. Other: No free fluid or free air. Musculoskeletal: No acute bony abnormality. IMPRESSION: No acute findings in the abdomen or pelvis. Prior Whipple procedure.  No complicating feature. Aortic atherosclerosis. Electronically Signed   By: Rolm Baptise M.D.   On: 03/06/2021 11:49   Dagar, Meredith Staggers, MD 03/06/2021, 11:18 PM PGY-1, [place] Intern pager: [phone], text pages welcome  FPTS Upper-Level Resident Addendum   I have independently interviewed and examined the patient. I have discussed the above with the original author and agree with their documentation. Please see also any attending notes.    Carollee Leitz MD PGY-2, [facility] 03/07/2021 5:31 AM  FPTS Service pager: [phone] (text pages welcome through [facility])

## 2021-03-06 NOTE — ED Provider Notes (Signed)
[facility] EMERGENCY DEPARTMENT Provider Note   CSN: [id number] Arrival date & time: 03/05/21  2157     History Chief Complaint  Patient presents with  . Abdominal Pain    Brent Rogers is a 55 y.o. male history of chronic pancreatitis, Whipple's procedure, hypertension, migraine, scoliosis, fatty liver disease, hypertension.  Patient presents to the ER for epigastric and left upper abdominal pain, nausea, vomiting and diarrhea that began around 1 day ago, patient reports this feels very similar to previous episodes of pancreatitis.  Pain is sharp constant nonradiating worsened with eating and no alleviating factors.  He reports pain will occasionally radiate up into his chest.  Patient feels that he is very dehydrated.  Patient reports yesterday around noon he had been vomiting, he went to go stand up felt lightheaded and believes he may have passed out for a few seconds, he denies any injury from that event.  He denies any preceding chest pain or shortness of breath prior to syncope.  Patient denies fever/chills, fall/injury, headache, neck pain, numbness/weakness, tingling, extremity swelling/color change, cough/shortness of breath or any additional concerns.  HPI     Past Medical History:  Diagnosis Date  . Benign tumor of endocrine pancreas   . Chronic pancreatitis ([place])    S/P Whipple  . Foot ulcer with fat layer exposed ([place])   . Hypertension   . Migraine    "@ least once/month" (11/12/2015)  . Pancreatic abnormality    CT  shows mass  . Pneumonia 11/12/2015  . Scoliosis     Patient Active Problem List   Diagnosis Date Noted  . Flank pain 08/31/2020  . Abdominal pain 08/30/2020  . 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 Chronic pain syndrome 06/07/2019  . Depression 06/07/2019  . Chronic pancreatitis ([place]) 05/01/2017  . Foot callus 12/25/2015  . Onychomycosis of toenail 12/25/2015  . Fatty liver 11/12/2015  . GERD (gastroesophageal  reflux disease) 11/12/2015  . HTN (hypertension) 07/10/2015  . S/P cholecystectomy 07/10/2015  . Tobacco abuse 12/26/2014  . Protein-calorie malnutrition, severe ([place]) 11/20/2014  . Recurrent pancreatitis 06/06/2014  . Exocrine pancreatic insufficiency ([place]) 12/23/2013    Past Surgical History:  Procedure Laterality Date  . BACK SURGERY    . EUS N/A 09/21/2013   Procedure: ESOPHAGEAL ENDOSCOPIC ULTRASOUND (EUS) RADIAL;  Surgeon: Beryle Beams, MD;  Location: [facility] ENDOSCOPY;  Service: Endoscopy;  Laterality: N/A;  . EUS N/A 09/30/2013   Procedure: UPPER ENDOSCOPIC ULTRASOUND (EUS) LINEAR;  Surgeon: Beryle Beams, MD;  Location: [facility] ENDOSCOPY;  Service: Endoscopy;  Laterality: N/A;  . FINE NEEDLE ASPIRATION N/A 09/21/2013   Procedure: FINE NEEDLE ASPIRATION (FNA) LINEAR;  Surgeon: Beryle Beams, MD;  Location: [facility] ENDOSCOPY;  Service: Endoscopy;  Laterality: N/A;  . FINGER FRACTURE SURGERY Left 1995   5th digit  . FRACTURE SURGERY    . KNEE ARTHROSCOPY Bilateral 1987-1989   right-left  . LAPAROSCOPY N/A 12/01/2013   Procedure: LAPAROSCOPY DIAGNOSTIC PANCREATICODUODENECTOMY WITH BILIARY AND PANCREATIC STENTS;  Surgeon: Stark Klein, MD;  Location: [facility] ORS;  Service: General;  Laterality: N/A;  . LUMBAR [place] SURGERY  1990's  . WHIPPLE PROCEDURE  12/01/2013       Family History  Problem Relation Age of Onset  . Cancer Mother        unsure  . COPD Mother   . Hypertension Mother   . Hypertension Father   . COPD Father   . CAD Sister  possible has stent per pt    Social History   Tobacco Use  . Smoking status: Current Every Day Smoker    Packs/day: 0.25    Years: 30.00    Pack years: 7.50    Types: Cigarettes  . Smokeless tobacco: Former Systems developer    Types: Snuff  . Tobacco comment: "used snuff 10-15 years in my 20s-30s"  Vaping Use  . Vaping Use: Never used  Substance Use Topics  . Alcohol use: Yes    Alcohol/week: 4.0 standard drinks    Types: 4 Cans of beer per week     Comment: weekly  . Drug use: No    Home Medications Prior to Admission medications   Medication Sig Start Date End Date Taking? Authorizing Provider  bismuth subsalicylate (PEPTO BISMOL) 262 MG/15ML suspension Take 30 mLs by mouth every 6 (six) hours as needed for indigestion or diarrhea or loose stools.    [provider]  buPROPion (WELLBUTRIN SR) 100 MG 12 hr tablet Take 1 tablet (100 mg total) by mouth 2 (two) times daily. 06/22/20   Fulp, Cammie, MD  dicyclomine (BENTYL) 20 MG tablet TAKE 1 TABLET BY MOUTH UP TO 4 TIMES PER DAY AS NEEDED FOR ABDOMINAL PAIN Patient taking differently: Take 20 mg by mouth 4 (four) times daily as needed for spasms (abdominal pain).  06/22/20 06/22/21  Fulp, Cammie, MD  ferrous sulfate 325 (65 FE) MG tablet Take 1 tablet (325 mg total) by mouth daily. 09/01/20 10/01/20  Cherylann Ratel A, DO  ondansetron (ZOFRAN ODT) 4 MG disintegrating tablet Take 1 tablet (4 mg total) by mouth every 8 (eight) hours as needed for nausea or vomiting. 01/19/21   Montine Circle, PA-C  oxyCODONE-acetaminophen (PERCOCET) 5-325 MG tablet Take 1 tablet by mouth every 4 (four) hours as needed. 01/19/21   Montine Circle, PA-C  Pancrelipase, Lip-Prot-Amyl, (CREON) 24000-76000 units CPEP Take 1 capsule (24,000 Units total) by mouth with breakfast, with lunch, and with evening meal. 06/22/20   Fulp, Cammie, MD  pantoprazole (PROTONIX) 40 MG tablet TAKE 1 TABLET (40 MG TOTAL) BY MOUTH DAILY. 09/01/20 09/01/21  Cherylann Ratel A, DO  promethazine (PHENERGAN) 25 MG suppository PLACE 1 SUPPOSITORY (25 MG TOTAL) RECTALLY EVERY 6 (SIX) HOURS AS NEEDED FOR NAUSEA OR VOMITING. 12/08/20 12/08/21  Deno Etienne, DO    Allergies    Patient has no known allergies.  Review of Systems   Review of Systems Ten systems are reviewed and are negative for acute change except as noted in the HPI  Physical Exam Updated Vital Signs BP (!) 138/99 (BP Location: Left Arm)   Pulse (!) 53   Temp 98.7 F (37.1 C)  (Oral)   Resp 16   Ht 6\' 3"  (1.905 m)   Wt 70.3 kg   SpO2 99%   BMI 19.37 kg/m   Physical Exam Constitutional:      General: He is not in acute distress.    Appearance: Normal appearance. He is well-developed. He is not ill-appearing or diaphoretic.  HENT:     Head: Normocephalic and atraumatic.  Eyes:     General: Vision grossly intact. Gaze aligned appropriately.     Pupils: Pupils are equal, round, and reactive to light.  Neck:     Trachea: Trachea and phonation normal.  Cardiovascular:     Rate and Rhythm: Normal rate and regular rhythm.  Pulmonary:     Effort: Pulmonary effort is normal. No respiratory distress.  Abdominal:     General: There  is no distension.     Palpations: Abdomen is soft.     Tenderness: There is abdominal tenderness in the epigastric area and left upper quadrant. There is guarding. There is no rebound.  Musculoskeletal:        General: Normal range of motion.     Cervical back: Normal range of motion.  Skin:    General: Skin is warm and dry.  Neurological:     Mental Status: He is alert.     GCS: GCS eye subscore is 4. GCS verbal subscore is 5. GCS motor subscore is 6.     Comments: Speech is clear and goal oriented, follows commands Major Cranial nerves without deficit, no facial droop Moves extremities without ataxia, coordination intact  Psychiatric:        Behavior: Behavior normal.     ED Results / Procedures / Treatments   Labs (all labs ordered are listed, but only abnormal results are displayed) Labs Reviewed  CBC WITH DIFFERENTIAL/PLATELET - Abnormal; Notable for the following components:      Result Value   RBC 3.94 (*)    Hemoglobin 10.4 (*)    HCT 32.6 (*)    RDW 20.2 (*)    All other components within normal limits  COMPREHENSIVE METABOLIC PANEL - Abnormal; Notable for the following components:   Glucose, Bld 105 (*)    BUN 5 (*)    Calcium 8.4 (*)    Total Protein 6.0 (*)    Albumin 3.1 (*)    All other components  within normal limits  LIPASE, BLOOD  URINALYSIS, DIPSTICK ONLY  TROPONIN I (HIGH SENSITIVITY)  TROPONIN I (HIGH SENSITIVITY)    EKG EKG Interpretation  Date/Time:  Tuesday March 05 2021 22:30:22 EDT Ventricular Rate:  85 PR Interval:  144 QRS Duration: 76 QT Interval:  378 QTC Calculation: 449 R Axis:   54 Text Interpretation: Normal sinus rhythm Normal ECG NO STEMI No significant change since last tracing Confirmed by Addison Lank [id number]) on 03/06/2021 2:11:04 AM   Radiology DG Chest 2 View  Result Date: 03/05/2021 CLINICAL DATA:  Chest pain after vomiting. EXAM: CHEST - 2 VIEW COMPARISON:  May 29, 2020 FINDINGS: The heart size and mediastinal contours are within normal limits. Both lungs are clear. The visualized skeletal structures are unremarkable. IMPRESSION: No active cardiopulmonary disease. Electronically Signed   By: Virgina Norfolk M.D.   On: 03/05/2021 23:09   CT ABDOMEN PELVIS W CONTRAST  Result Date: 03/06/2021 CLINICAL DATA:  Abdominal pain, nausea, vomiting EXAM: CT ABDOMEN AND PELVIS WITH CONTRAST TECHNIQUE: Multidetector CT imaging of the abdomen and pelvis was performed using the standard protocol following bolus administration of intravenous contrast. CONTRAST:  56mL OMNIPAQUE IOHEXOL 300 MG/ML  SOLN COMPARISON:  01/19/2021 FINDINGS: Lower chest: Lung bases are clear. No effusions. Heart is normal size. Hepatobiliary: No focal liver abnormality is seen. Status post cholecystectomy. No biliary dilatation. Pancreas: No focal abnormality or ductal dilatation. Prior Whipple procedure. Spleen: No focal abnormality.  Normal size. Adrenals/Urinary Tract: Few scattered cysts bilaterally. No hydronephrosis or adrenal glands and urinary bladder unremarkable. Stomach/Bowel: Stomach, large and small bowel grossly unremarkable. Postoperative changes from prior Whipple procedure. Gastrojejunostomy unremarkable. Vascular/Lymphatic: Aortic atherosclerosis. No evidence of aneurysm or  adenopathy. Reproductive: No visible focal abnormality. Other: No free fluid or free air. Musculoskeletal: No acute bony abnormality. IMPRESSION: No acute findings in the abdomen or pelvis. Prior Whipple procedure.  No complicating feature. Aortic atherosclerosis. Electronically Signed   By: Rolm Baptise  M.D.   On: 03/06/2021 11:49    Procedures Procedures   Medications Ordered in ED Medications  ondansetron (ZOFRAN-ODT) disintegrating tablet 4 mg (4 mg Oral Total Dose 03/06/21 1105)  sodium chloride 0.9 % bolus 1,000 mL (0 mLs Intravenous Stopped 03/06/21 1231)  fentaNYL (SUBLIMAZE) injection 50 mcg (50 mcg Intravenous Given 03/06/21 1103)  iohexol (OMNIPAQUE) 300 MG/ML solution 80 mL (80 mLs Intravenous Contrast Given 03/06/21 1129)  fentaNYL (SUBLIMAZE) injection 50 mcg (50 mcg Intravenous Given 03/06/21 1235)  pantoprazole (PROTONIX) injection 40 mg (40 mg Intravenous Given 03/06/21 1235)  oxyCODONE (Oxy IR/ROXICODONE) immediate release tablet 5 mg (5 mg Oral Given 03/06/21 1511)    ED Course  I have reviewed the triage vital signs and the nursing notes.  Pertinent labs & imaging results that were available during my care of the patient were reviewed by me and considered in my medical decision making (see chart for details).  Clinical Course as of 03/06/21 1536  Wed Mar 06, 8466  1780 55 year old male with chronic pancreatitis here with worsening abdominal pain over the last few days.  He is mildly tender on exam.  Lab work-up has been fairly reassuring along with imaging.  He says he takes tramadol at home for his pain.  He gets these attacks maybe every month or so.  Will likely need some pain medicine for discharge. [MB]    Clinical Course User Index [MB] Hayden Rasmussen, MD   MDM Rules/Calculators/A&P                         Additional history obtained from: 1. Nursing notes from this visit. 2. Review of electronic medical records. ---------------------------- I reviewed and  interpreted labs which include: Initial and delta high-sensitivity troponins within normal limits (5->4) Lipase within normal limits Urinalysis within normal limits CBC shows baseline anemia of 10.4, no leukocytosis or thrombocytopenia. CMP shows no emergent electrolyte derangement, AKI, LFT elevations or gap.  CXR:  IMPRESSION:  No active cardiopulmonary disease.   CT AP:  IMPRESSION:  No acute findings in the abdomen or pelvis.    Prior Whipple procedure. No complicating feature.    Aortic atherosclerosis.  ----- Cardiac work-up is reassuring, low suspicion for ACS, PE, dissection or other acute cardiopulmonary etiology at this time.  Suspect patient symptoms secondary to chronic abdominal pain, pancreatitis possibly some degree of gastritis.  Patient received several doses of IV pain medication and additional oral dose of oxycodone, he also received Protonix.  I reassessed the patient he reports continued abdominal pain and nausea he has been dry heaving here.  Considering we tried several different medications, patient may need admission for intractable nausea and abdominal pain due to pancreatitis.  Patient is agreeable for admission.  Consult placed to medicine service    Note: Portions of this report may have been transcribed using voice recognition software. Every effort was made to ensure accuracy; however, inadvertent computerized transcription errors may still be present. Final Clinical Impression(s) / ED Diagnoses Final diagnoses:  None    Rx / DC Orders ED Discharge Orders    None       Gari Crown 03/06/21 1606    Hayden Rasmussen, MD 03/06/21 2109

## 2021-03-06 NOTE — Hospital Course (Addendum)
Brent Rogers is a 55 y.o. M presented with acute abdominal pain . PMHx significant for chronic pancreatitis, Whipple's procedure, HTN, Migraine, Scoliosis, fatty liver disease, iron deficiecy anemia.  Acute on chronic Abdominal pain I Hx of chronic pancreatitis s/p Whipple's (2015) Patient presented to ED with epigastric and left upper abdominal pain, nausea, vomiting and diarrhea x 5 days.  Had previous similar episodes, but this episode worse.  LUQ and LLQ tender to palpation on exam. CT Abdomen and Pelvis obtained and had no acute findings.  GI consulted given patient's history and opted for EGD/Colonoscopy after being cleared by Cardiology for Bradycardia.  Colonoscopy obtained and reveled mild, non-bleeding diverticulosis.  (See EGD below in dysphagia).  Abomnal pain improved on discharge.  Patient diet advanced and able to be discharged with GI and ENT clearance.  Return precautions given to patient.      Bradycardia Patient remained asymptomatic while in hospital.  Unsure if baseline or new symptoms.  Cardiology consulted prior to procedure.  Indicated no sign of AV block.  Had Cardiac CTA CAD RADS 2 that revealed no obstructive CAD tightest lesions in LAD/IM 25-49%.  Cleared for EGD and Colonoscopy.      Dysphagia Patient complained of this over past few months in addition to abdominal pain.  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  Normal Whipple revealed with friable bleeding that was treated with monopolar probe and clip placement.  Some trauma to posterior Pharyngeal region observed and ENT consulted.  Kept NPO, given 1 dose of IV [place] and ENT consulted.  Obtained X-ray and repeat X-ray following day that revealed no perforation.  Dysphagia resolved following procedure.  Diet advanced to full solid diet, which patient tolerated prior to discharge.  Syncope Episode occurred at home after frequent vomiting.  No findings indicative of concussion.  TSH, A1C, ECHO obtained  and found to be normal.  Had no repeat episodes while hospitalized.  Thought to be due to fluid losses from frequent vomiting.   Iron Deficiency Anemia Thought to be likely due to friable jejunal mucosa  Patient remained stable following procedure and had multiple bowel movements with no melena or hematochezia.  Hgb readings had some fluctuations though these thought to be due to lab errors by our team and GI as no clinical findings supportive of this.  Depression/Anxiety Patient Buproprion discontinued given BMI and Vomiting, and concern for possible seizures.  All other issues chronic and stable  Issues for follow-up Chronic Pancreatitis-  Follow-up with GI as needed.  PCP follow-up week following discharge. EGD/Colonoscopy- Repeat colonoscopy in 10 years.  Ensure patient remains stab Iron Deficiency Anemia- Continue oral iron supplementation.  Recommend repeat CBC at PCP follow-up. Depresion/Anxiety- Buproprion stopped.  Consider starting patient on SSRI.

## 2021-03-06 NOTE — Progress Notes (Addendum)
FPTS Consult Note  S:Called by ED to consult for appropriateness of admission.   55 yo M here with abdominal pain for 1 day. PMHX of chronic pancreatitis, whipple procedure, HTN, FLD. Endorsing left upper abdominal pain accompanied by left sided back pain. States his abdominal pain is usually managed with tramadol but was not resolving at home. He has not eating or had anything to drink in several days. Felt dizzy today and fell on to lower back at home. Has back pain but feels like his usual pain with abdominal pain. He thinks he was dizzy from dehydration and states he is feeling somewhat better. He would like to eat and drink something to see if he can keep it down. Has not vomited since being in the ED. Last alcoholic beverage stated to be last week.   O: BP (!) 152/92   Pulse 50   Temp 97.9 F (36.6 C) (Oral)   Resp 16   Ht 6\' 3"  (1.905 m)   Wt 70.3 kg   SpO2 100%   BMI 19.37 kg/m   General: Appears stable, no acute distress. Age appropriate. Cardiac: RRR, normal heart sounds, no murmurs Respiratory: CTAB, normal effort Abdomen: soft, left upper quadrant mildly tender to palpations, nondistended, no guarding, no masses, bowel sounds are present Extremities: No edema or cyanosis. Skin: Warm and dry, no rashes noted Neuro: alert and oriented x4  Psych: normal affect  CHEST - 2 VIEW COMPARISON:  May 29, 2020 IMPRESSION: No active cardiopulmonary disease.   CT ABDOMEN AND PELVIS WITH CONTRAST  COMPARISON:  01/19/2021 IMPRESSION: No acute findings in the abdomen or pelvis. Prior Whipple procedure.  No complicating feature. Aortic atherosclerosis.  A/P: Patient discussed with Attending Dr. Gwendlyn Deutscher.   Chronic abdominal pain  Hx of chronic pancreatitis VSS. Lipase 18. Calcium 8.4 (Corrected to 9.1) Hgb  10.4. UA wnl spec grav 1.019. CXR wnl. CT abd/pelvis w/o acute findings. S/p fentanyl x2, oxycodone x1, 2L bolus with mild left sided abdominal pain without guarding on exam.  Ranson score is 0 at this time. Given bag lunch and juice in ED and was able to tolerate. Could possibly discharge home with close PCP follow up. Discussed repeat CBC/CMP (prior labs were the night before) and if normal and ranson score remains low, we will reassess and discussed discharge with ED provider. ED Provider agrees with this plan.  -Repeat CBC, CMP -Reassess for appropriateness of admission v. Discharge  Gerlene Fee, DO 03/06/2021, 7:11 PM PGY-2, [place] pager [phone]

## 2021-03-06 NOTE — ED Provider Notes (Signed)
  Physical Exam  BP (!) 152/92 (BP Location: Left Arm)   Pulse (!) 50   Temp 98.7 F (37.1 C) (Oral)   Resp 16   Ht 6\' 3"  (1.905 m)   Wt 70.3 kg   SpO2 100%   BMI 19.37 kg/m   Physical Exam  ED Course/Procedures   Clinical Course as of 03/06/21 1959  Wed Mar 06, 8428  8245 55 year old male with chronic pancreatitis here with worsening abdominal pain over the last few days.  He is mildly tender on exam.  Lab work-up has been fairly reassuring along with imaging.  He says he takes tramadol at home for his pain.  He gets these attacks maybe every month or so.  Will likely need some pain medicine for discharge. [MB]  2979 Consult with FM residency team.  They evaluated the patient.  Recommend rechecking CMP specifically recheck of calcium level.  They have given him a sandwich and will need to recheck after p.o. challenge. [HK]  8921 Patient able to tolerate p.o. intake without difficulty.  No vomiting noted.  Will redraw CMP [HK]  1959 Spoke to family medicine residency team again.  After reviewing patient's chart they are concerned about his syncope and will admit for further work-up.  Patient is agreeable to this. [HK]    Clinical Course User Index [HK] Delia Heady, PA-C [MB] Hayden Rasmussen, MD    Procedures  MDM  55 year old male here for abdominal pain.  History of chronic pancreatitis.  Lipase normal here.  CT without any abnormalities.  Unfortunately was unable to control pain.  Family practice will admit for further work-up for syncope.      Delia Heady, PA-C 03/06/21 2004    Rex Kras Wenda Overland, MD 03/06/21 2020

## 2021-03-07 ENCOUNTER — Observation Stay (HOSPITAL_COMMUNITY): Payer: Medicaid Other

## 2021-03-07 DIAGNOSIS — R0609 Other forms of dyspnea: Secondary | ICD-10-CM | POA: Diagnosis not present

## 2021-03-07 DIAGNOSIS — R109 Unspecified abdominal pain: Secondary | ICD-10-CM | POA: Diagnosis not present

## 2021-03-07 DIAGNOSIS — R55 Syncope and collapse: Secondary | ICD-10-CM | POA: Diagnosis not present

## 2021-03-07 LAB — ECHOCARDIOGRAM COMPLETE
AR max vel: 2.75 cm2
AV Area VTI: 2.57 cm2
AV Area mean vel: 2.44 cm2
AV Mean grad: 2 mmHg
AV Peak grad: 4.2 mmHg
Ao pk vel: 1.02 m/s
Area-P 1/2: 3.65 cm2
Height: 76 in
MV VTI: 2.5 cm2
S' Lateral: 3.2 cm
Weight: 2141.11 oz

## 2021-03-07 LAB — CBC WITH DIFFERENTIAL/PLATELET
Abs Immature Granulocytes: 0.02 10*3/uL (ref 0.00–0.07)
Basophils Absolute: 0.1 10*3/uL (ref 0.0–0.1)
Basophils Relative: 2 %
Eosinophils Absolute: 0.1 10*3/uL (ref 0.0–0.5)
Eosinophils Relative: 2 %
HCT: 28.6 % — ABNORMAL LOW (ref 39.0–52.0)
Hemoglobin: 9.3 g/dL — ABNORMAL LOW (ref 13.0–17.0)
Immature Granulocytes: 0 %
Lymphocytes Relative: 32 %
Lymphs Abs: 1.9 10*3/uL (ref 0.7–4.0)
MCH: 27.6 pg (ref 26.0–34.0)
MCHC: 32.5 g/dL (ref 30.0–36.0)
MCV: 84.9 fL (ref 80.0–100.0)
Monocytes Absolute: 0.8 10*3/uL (ref 0.1–1.0)
Monocytes Relative: 13 %
Neutro Abs: 3 10*3/uL (ref 1.7–7.7)
Neutrophils Relative %: 51 %
Platelets: 281 10*3/uL (ref 150–400)
RBC: 3.37 MIL/uL — ABNORMAL LOW (ref 4.22–5.81)
RDW: 20.8 % — ABNORMAL HIGH (ref 11.5–15.5)
WBC: 5.9 10*3/uL (ref 4.0–10.5)
nRBC: 0 % (ref 0.0–0.2)

## 2021-03-07 LAB — FERRITIN: Ferritin: 11 ng/mL — ABNORMAL LOW (ref 24–336)

## 2021-03-07 LAB — COMPREHENSIVE METABOLIC PANEL
ALT: 14 U/L (ref 0–44)
AST: 30 U/L (ref 15–41)
Albumin: 2.6 g/dL — ABNORMAL LOW (ref 3.5–5.0)
Alkaline Phosphatase: 74 U/L (ref 38–126)
Anion gap: 4 — ABNORMAL LOW (ref 5–15)
BUN: <5 mg/dL — ABNORMAL LOW (ref 6–20)
CO2: 29 mmol/L (ref 22–32)
Calcium: 8.5 mg/dL — ABNORMAL LOW (ref 8.9–10.3)
Chloride: 106 mmol/L (ref 98–111)
Creatinine, Ser: 0.77 mg/dL (ref 0.61–1.24)
GFR, Estimated: >60 mL/min (ref >60)
Glucose, Bld: 91 mg/dL (ref 70–99)
Potassium: 3.9 mmol/L (ref 3.5–5.1)
Sodium: 139 mmol/L (ref 135–145)
Total Bilirubin: 0.5 mg/dL (ref 0.3–1.2)
Total Protein: 5.1 g/dL — ABNORMAL LOW (ref 6.5–8.1)

## 2021-03-07 LAB — RAPID URINE DRUG SCREEN, HOSP PERFORMED
Amphetamines: NOT DETECTED
Barbiturates: NOT DETECTED
Benzodiazepines: NOT DETECTED
Cocaine: NOT DETECTED
Opiates: NOT DETECTED
Tetrahydrocannabinol: NOT DETECTED

## 2021-03-07 LAB — PHOSPHORUS: Phosphorus: 3.1 mg/dL (ref 2.5–4.6)

## 2021-03-07 LAB — SARS CORONAVIRUS 2 (TAT 6-24 HRS): SARS Coronavirus 2: NEGATIVE

## 2021-03-07 MED ORDER — SENNA 8.6 MG PO TABS
1.0000 | ORAL_TABLET | Freq: Every day | ORAL | Status: DC
  Administered 2021-03-07: 8.6 mg via ORAL
  Filled 2021-03-07 (×2): qty 1

## 2021-03-07 MED ORDER — MAGNESIUM SULFATE 2 GM/50ML IV SOLN
2.0000 g | Freq: Once | INTRAVENOUS | Status: AC
  Administered 2021-03-07: 2 g via INTRAVENOUS
  Filled 2021-03-07: qty 50

## 2021-03-07 MED ORDER — POLYETHYLENE GLYCOL 3350 17 G PO PACK
17.0000 g | PACK | Freq: Two times a day (BID) | ORAL | Status: DC
  Administered 2021-03-07 – 2021-03-10 (×5): 17 g via ORAL
  Filled 2021-03-07 (×6): qty 1

## 2021-03-07 MED ORDER — FERROUS SULFATE 325 (65 FE) MG PO TABS
325.0000 mg | ORAL_TABLET | Freq: Every day | ORAL | Status: DC
  Administered 2021-03-07 – 2021-03-13 (×6): 325 mg via ORAL
  Filled 2021-03-07 (×6): qty 1

## 2021-03-07 MED ORDER — TRAMADOL HCL 50 MG PO TABS
50.0000 mg | ORAL_TABLET | Freq: Two times a day (BID) | ORAL | Status: DC | PRN
  Administered 2021-03-07 – 2021-03-12 (×8): 50 mg via ORAL
  Filled 2021-03-07 (×8): qty 1

## 2021-03-07 NOTE — Progress Notes (Signed)
Family Medicine Teaching Service Daily Progress Note Intern Pager: [phone]  Patient name: Brent Rogers Medical record number: [id number] Date of birth: 09-11-1966 Age: 55 y.o. Gender: male  Primary Care Provider: Ladell Pier, MD Consultants: None Code Status: Full  Pt Overview and Major Events to Date:  Admitted-6/8  Assessment and Plan: JAMILL WETMORE is a 55 y.o. male presenting with abdominal pain . PMH is significant for chronic pancreatitis, Whipple's procedure, HTN, Migraine, Scoliosis, fatty liver disease, normocytic anemia.    Acute on chronic Abdominal pain I Hx of chronic pancreatitis s/p Whipple's (2015) UDS negative.  Plan to consult GI for possible EGD. - NPO at midnight - PT/OT eval and treat - Continue home Tramadol, Bentyl and Creon  Syncope TSH- 2.96.  UDS negative.   - follow up ECHO - follow up A1C  Anemia 10.3>9.1>9.3.  Iron- 194.  Saturation Ratio- 59.  Possibly dilutional given similar fluctuations in Platelets, although not in [facility].  Patient had no previous colonoscopy. - follow up with GI regarding colonoscopy - follow up Ferritin  Bradycardia Concern this may be new concern for patient. - Plan to touch base with Cardiology regarding  Depression Stop Welbutrin.   - Will consider starting SSRI prior to discharge   FEN/GI: Full Liquid Diet PPx: Lovenox   Status is: Observation  The patient will require care spanning > 2 midnights and should be moved to inpatient because: Ongoing diagnostic testing needed not appropriate for outpatient work up  Dispo: The patient is from: Home              Anticipated d/c is to: Home              Patient currently is not medically stable to d/c.   Difficult to place patient No    Subjective:  Patient still having left sided abdominal pain radiating to back.  Feeling less nauseas.    Objective: Temp:  [97.6 F (36.4 C)-98.7 F (37.1 C)] 98.1 F (36.7 C) (06/09 0335) Pulse Rate:  [50-74]  58 (06/09 0335) Resp:  [15-18] 18 (06/09 0335) BP: (124-177)/(84-110) 124/84 (06/09 0335) SpO2:  [99 %-100 %] 100 % (06/09 0335) Weight:  [60.7 kg] 60.7 kg (06/08 2225) Physical Exam:  Physical Exam Constitutional:      General: He is not in acute distress. HENT:     Head: Normocephalic and atraumatic.     Mouth/Throat:     Mouth: Mucous membranes are moist.  Cardiovascular:     Rate and Rhythm: Regular rhythm. Bradycardia present.     Heart sounds: Normal heart sounds.  Abdominal:     General: Bowel sounds are increased.  Neurological:     Mental Status: He is alert.     Laboratory: Recent Labs  Lab 03/05/21 2227 03/06/21 1836 03/07/21 0139  WBC 6.2 7.4 5.9  HGB 10.4* 9.1* 9.3*  HCT 32.6* 29.2* 28.6*  PLT 348 267 281   Recent Labs  Lab 03/05/21 2227 03/06/21 1805 03/07/21 0139  NA 138 137 139  K 3.8 3.6 3.9  CL 106 105 106  CO2 22 22 29   BUN 5* 5* <5*  CREATININE 0.72 0.75 0.77  CALCIUM 8.4* 8.2* 8.5*  PROT 6.0* 5.4* 5.1*  BILITOT 0.4 0.7 0.5  ALKPHOS 85 80 74  ALT 15 14 14   AST 40 38 30  GLUCOSE 105* 179* 91     Imaging/Diagnostic Tests: EXAM: CT ABDOMEN AND PELVIS WITH CONTRAST   TECHNIQUE: Multidetector CT imaging of the  abdomen and pelvis was performed using the standard protocol following bolus administration of intravenous contrast.   CONTRAST:  20mL OMNIPAQUE IOHEXOL 300 MG/ML  SOLN   COMPARISON:  01/19/2021   FINDINGS: Lower chest: Lung bases are clear. No effusions. Heart is normal size.   Hepatobiliary: No focal liver abnormality is seen. Status post cholecystectomy. No biliary dilatation.   Pancreas: No focal abnormality or ductal dilatation. Prior Whipple procedure.   Spleen: No focal abnormality.  Normal size.   Adrenals/Urinary Tract: Few scattered cysts bilaterally. No hydronephrosis or adrenal glands and urinary bladder unremarkable.   Stomach/Bowel: Stomach, large and small bowel grossly unremarkable. Postoperative  changes from prior Whipple procedure. Gastrojejunostomy unremarkable.   Vascular/Lymphatic: Aortic atherosclerosis. No evidence of aneurysm or adenopathy.   Reproductive: No visible focal abnormality.   Other: No free fluid or free air.   Musculoskeletal: No acute bony abnormality.   IMPRESSION: No acute findings in the abdomen or pelvis.   Prior Whipple procedure.  No complicating feature.   Aortic atherosclerosis.     Electronically Signed   By: Rolm Baptise M.D.   On: 03/06/2021 11:49  Delora Fuel, MD 03/07/2021, 7:00 AM PGY-1, [place] Intern pager: [phone]-, text pages welcome

## 2021-03-07 NOTE — Progress Notes (Signed)
PT Cancellation Note  Patient Details Name: Brent Rogers MRN: [id number] DOB: Feb 14, 1966   Cancelled Treatment:    Reason Eval/Treat Not Completed: PT screened on the recommendation of OT,  no needs identified, will sign off. 03/07/2021  Ginger Carne., PT Acute Rehabilitation Services [phone]  (pager) [phone]  (office)   [name] [name] 03/07/2021, 9:50 AM

## 2021-03-07 NOTE — Evaluation (Signed)
Occupational Therapy Evaluation/Discharge Patient Details Name: Brent Rogers MRN: [id number] DOB: 1966-04-11 Today's Date: 03/07/2021    History of Present Illness 55 y.o. male presenting to ED on 6/7 with N/V/D, syncope and abdominal pain x5 days. admitted with acute on chronic abdomina pain. PMHx significant for chronic pancreatitis, Whipple's procedure, HTN, migraines and scoliosis.   Clinical Impression   PTA patient was living alone in an apartment with 9 STE and was grossly I with ADLs/IADLs without AD. Patient currently functioning at baseline demonstrating observed ADLs including toilet transfer, LB dressing and grooming standing at sink level with I. Patient does not require continued acute occupational therapy services with OT to sign off at this time.     Follow Up Recommendations  No OT follow up    Equipment Recommendations  None recommended by OT    Recommendations for Other Services       Precautions / Restrictions Precautions Precautions: None Restrictions Weight Bearing Restrictions: No      Mobility Bed Mobility Overal bed mobility: Independent                  Transfers Overall transfer level: Independent               General transfer comment: Completes sit to stand transfers from various surfaces with I.    Balance Overall balance assessment: Independent                                         ADL either performed or assessed with clinical judgement   ADL Overall ADL's : Independent                                             Vision Patient Visual Report: No change from baseline       Perception     Praxis      Pertinent Vitals/Pain Pain Assessment: 0-10 Pain Score: 7  Pain Location: Abdomen Pain Descriptors / Indicators: Aching;Sore Pain Intervention(s): Limited activity within patient's tolerance;Monitored during session;Repositioned     Hand Dominance     Extremity/Trunk  Assessment Upper Extremity Assessment Upper Extremity Assessment: Overall WFL for tasks assessed (Reports feeling weak as he has not eaten in 2 days.)   Lower Extremity Assessment Lower Extremity Assessment: Overall WFL for tasks assessed   Cervical / Trunk Assessment Cervical / Trunk Assessment: Other exceptions Cervical / Trunk Exceptions: Hx of scoliosis   Communication Communication Communication: No difficulties   Cognition Arousal/Alertness: Awake/alert Behavior During Therapy: WFL for tasks assessed/performed Overall Cognitive Status: Within Functional Limits for tasks assessed                                     General Comments  Orthostatics negative    Exercises     Shoulder Instructions      Home Living Family/patient expects to be discharged to:: Private residence Living Arrangements: Alone Available Help at Discharge: Family;Available PRN/intermittently (Aunt provides transportation occasionally) Type of Home: Apartment (1st floor) Home Access: Stairs to enter Entrance Stairs-Number of Steps: 9 Entrance Stairs-Rails: Right;Left;Can reach both Home Layout: One level     Bathroom Shower/Tub: Producer, television/film/video: Standard  Home Equipment: Grab bars - tub/shower          Prior Functioning/Environment Level of Independence: Independent        Comments: I with ADLs/IADLs; takes public transportation vs aunt; does not work        OT Problem List:        OT Treatment/Interventions:      OT Goals(Current goals can be found in the care plan section) Acute Rehab OT Goals Patient Stated Goal: To return home. OT Goal Formulation: With patient  OT Frequency:     Barriers to D/C:            Co-evaluation              AM-PAC OT "6 Clicks" Daily Activity     Outcome Measure Help from another person eating meals?: None Help from another person taking care of personal grooming?: None Help from another person  toileting, which includes using toliet, bedpan, or urinal?: None Help from another person bathing (including washing, rinsing, drying)?: None Help from another person to put on and taking off regular upper body clothing?: None Help from another person to put on and taking off regular lower body clothing?: None 6 Click Score: 24   End of Session Equipment Utilized During Treatment: Gait belt Nurse Communication: Mobility status  Activity Tolerance: Patient tolerated treatment well Patient left: in chair;with call bell/phone within reach  OT Visit Diagnosis: Muscle weakness (generalized) (M62.81)                Time: 0272-5366 OT Time Calculation (min): 18 min Charges:  OT General Charges $OT Visit: 1 Visit OT Evaluation $OT Eval Low Complexity: 1 Low  Brent Rogers H. OTR/L Supplemental OT, Department of rehab services [phone]  Brent Rogers R H. 03/07/2021, 8:34 AM

## 2021-03-07 NOTE — Progress Notes (Signed)
I will cosign resident's daily progress note once it is completed.  55 Y/O M with PMX of Chronic recurrent pancreatitis, Foot ulcer, HTN, Iron deficiency anemia, presented with his of abdominal pain worsening over the past few days. Denies blood in his stool. His last BM was about four days ago since he had been unable to each due to nausea and retching. He endorsed passing out last Sunday while going to use the bathroom. He denies head injury or pain.  Exam: Gen: Pleasant gentleman calm in bed with no distress. HEENT: EOMI, PERRLA. Linear healing scratch on his scalp, barely noticeble. No laceration Neuro: Awake and alert, oriented x 3, no sensory loss, ++DTR biceps B/L. Gait was not assessed. Heart: S1 S2 normal, no murmur. RRR. Lungs: CTA B/L. Abd: Soft, moderate right mid abdominal quadrant abdominal tenderness. No distension. Ext: No edema.  A/P:  Acute on chronic recurrent pancreatitis as evident by abdominal pain. CT abdomen was reviewed without acute findings. NPO and advance his diet as tolerated. Tramadol prn pain.  Acute on Chronic Iron deficiency anemia. ?? Some components of dilution anemia. ?? GI bleed. FOBT will likely be a false positive since he is on home iron. Resume home iron. Consult GI for bidirectional endoscopy since he is due for a colon cancer screen. Monitor CBC closely with a transfusion threshold of <7  Syncope: ?? Related to anemia vs. vasovagal vs related to bradycardia. ECHO ordered. CT head not required due to no head injury and no neurologic deficit on his exam. Reconsider CT if there is a change in neuro findings. PT/OT ordered. F/U orthostatic vitals. Implement fall precautions. Monitor CBC closely.  Bradycardia: Unclear if this is new. EKG showed HR> 60 Previously reported HR was also within the normal range with occasional bradycardia. Given recent syncope and hx of fatigue and dizziness over the last few days, I recommended obtaining an  ECHO. Monitor cardiac activity on telemetry and consult cardiology. The resident will update the consultation. Monitor closely for now.

## 2021-03-07 NOTE — Progress Notes (Signed)
Spoke with Dr Carol Ada on phone.  Indicated would plan on seeing patient tomorrow morning and to make NPO after midnight for possible procedure.   Delora Fuel, MD 03/07/2021, 8:41 PM PGY-1, [place] Medicine Service pager [phone]

## 2021-03-07 NOTE — Progress Notes (Signed)
  Echocardiogram 2D Echocardiogram has been performed.  Brent Rogers 03/07/2021, 9:35 AM

## 2021-03-08 ENCOUNTER — Inpatient Hospital Stay (HOSPITAL_COMMUNITY): Payer: Medicaid Other

## 2021-03-08 DIAGNOSIS — F1010 Alcohol abuse, uncomplicated: Secondary | ICD-10-CM | POA: Diagnosis not present

## 2021-03-08 DIAGNOSIS — R109 Unspecified abdominal pain: Secondary | ICD-10-CM | POA: Diagnosis present

## 2021-03-08 DIAGNOSIS — K5909 Other constipation: Secondary | ICD-10-CM | POA: Diagnosis present

## 2021-03-08 DIAGNOSIS — F419 Anxiety disorder, unspecified: Secondary | ICD-10-CM | POA: Diagnosis present

## 2021-03-08 DIAGNOSIS — K921 Melena: Secondary | ICD-10-CM | POA: Diagnosis not present

## 2021-03-08 DIAGNOSIS — R309 Painful micturition, unspecified: Secondary | ICD-10-CM | POA: Diagnosis present

## 2021-03-08 DIAGNOSIS — R55 Syncope and collapse: Secondary | ICD-10-CM | POA: Diagnosis not present

## 2021-03-08 DIAGNOSIS — E860 Dehydration: Secondary | ICD-10-CM | POA: Diagnosis not present

## 2021-03-08 DIAGNOSIS — R0789 Other chest pain: Secondary | ICD-10-CM

## 2021-03-08 DIAGNOSIS — R079 Chest pain, unspecified: Secondary | ICD-10-CM | POA: Diagnosis not present

## 2021-03-08 DIAGNOSIS — R001 Bradycardia, unspecified: Secondary | ICD-10-CM

## 2021-03-08 DIAGNOSIS — G894 Chronic pain syndrome: Secondary | ICD-10-CM | POA: Diagnosis present

## 2021-03-08 DIAGNOSIS — K922 Gastrointestinal hemorrhage, unspecified: Secondary | ICD-10-CM | POA: Diagnosis not present

## 2021-03-08 DIAGNOSIS — D500 Iron deficiency anemia secondary to blood loss (chronic): Secondary | ICD-10-CM | POA: Diagnosis not present

## 2021-03-08 DIAGNOSIS — K760 Fatty (change of) liver, not elsewhere classified: Secondary | ICD-10-CM | POA: Diagnosis not present

## 2021-03-08 DIAGNOSIS — R070 Pain in throat: Secondary | ICD-10-CM | POA: Diagnosis present

## 2021-03-08 DIAGNOSIS — R0609 Other forms of dyspnea: Secondary | ICD-10-CM | POA: Diagnosis present

## 2021-03-08 DIAGNOSIS — Z20822 Contact with and (suspected) exposure to covid-19: Secondary | ICD-10-CM | POA: Diagnosis not present

## 2021-03-08 DIAGNOSIS — F32A Depression, unspecified: Secondary | ICD-10-CM | POA: Diagnosis not present

## 2021-03-08 DIAGNOSIS — I10 Essential (primary) hypertension: Secondary | ICD-10-CM | POA: Diagnosis present

## 2021-03-08 DIAGNOSIS — K861 Other chronic pancreatitis: Secondary | ICD-10-CM | POA: Diagnosis present

## 2021-03-08 DIAGNOSIS — I2510 Atherosclerotic heart disease of native coronary artery without angina pectoris: Secondary | ICD-10-CM | POA: Diagnosis not present

## 2021-03-08 DIAGNOSIS — Q394 Esophageal web: Secondary | ICD-10-CM | POA: Diagnosis not present

## 2021-03-08 DIAGNOSIS — Z681 Body mass index (BMI) 19 or less, adult: Secondary | ICD-10-CM | POA: Diagnosis not present

## 2021-03-08 DIAGNOSIS — F329 Major depressive disorder, single episode, unspecified: Secondary | ICD-10-CM | POA: Diagnosis not present

## 2021-03-08 DIAGNOSIS — K8681 Exocrine pancreatic insufficiency: Secondary | ICD-10-CM | POA: Diagnosis not present

## 2021-03-08 DIAGNOSIS — F17210 Nicotine dependence, cigarettes, uncomplicated: Secondary | ICD-10-CM | POA: Diagnosis present

## 2021-03-08 DIAGNOSIS — D509 Iron deficiency anemia, unspecified: Secondary | ICD-10-CM | POA: Diagnosis not present

## 2021-03-08 DIAGNOSIS — K222 Esophageal obstruction: Secondary | ICD-10-CM | POA: Diagnosis not present

## 2021-03-08 DIAGNOSIS — R300 Dysuria: Secondary | ICD-10-CM | POA: Diagnosis present

## 2021-03-08 DIAGNOSIS — Z9889 Other specified postprocedural states: Secondary | ICD-10-CM | POA: Diagnosis not present

## 2021-03-08 LAB — HEMOGLOBIN A1C
Hgb A1c MFr Bld: 5.2 % (ref 4.8–5.6)
Mean Plasma Glucose: 103 mg/dL

## 2021-03-08 LAB — CBC
HCT: 28.7 % — ABNORMAL LOW (ref 39.0–52.0)
Hemoglobin: 9.1 g/dL — ABNORMAL LOW (ref 13.0–17.0)
MCH: 26.6 pg (ref 26.0–34.0)
MCHC: 31.7 g/dL (ref 30.0–36.0)
MCV: 83.9 fL (ref 80.0–100.0)
Platelets: 248 10*3/uL (ref 150–400)
RBC: 3.42 MIL/uL — ABNORMAL LOW (ref 4.22–5.81)
RDW: 20.5 % — ABNORMAL HIGH (ref 11.5–15.5)
WBC: 6.3 10*3/uL (ref 4.0–10.5)
nRBC: 0 % (ref 0.0–0.2)

## 2021-03-08 MED ORDER — NITROGLYCERIN 0.4 MG SL SUBL
SUBLINGUAL_TABLET | SUBLINGUAL | Status: AC
  Filled 2021-03-08: qty 2

## 2021-03-08 MED ORDER — SODIUM CHLORIDE 0.9 % IV SOLN: INTRAVENOUS | Status: AC

## 2021-03-08 MED ORDER — NITROGLYCERIN 0.4 MG SL SUBL
0.8000 mg | SUBLINGUAL_TABLET | Freq: Once | SUBLINGUAL | Status: AC
  Administered 2021-03-08: 0.8 mg via SUBLINGUAL

## 2021-03-08 MED ORDER — IOHEXOL 350 MG/ML SOLN
80.0000 mL | Freq: Once | INTRAVENOUS | Status: AC | PRN
  Administered 2021-03-08: 80 mL via INTRAVENOUS

## 2021-03-08 MED ORDER — FENTANYL CITRATE (PF) 100 MCG/2ML IJ SOLN
12.5000 ug | Freq: Once | INTRAMUSCULAR | Status: AC
  Administered 2021-03-08: 12.5 ug via INTRAVENOUS
  Filled 2021-03-08: qty 2

## 2021-03-08 NOTE — Progress Notes (Signed)
Family Medicine Teaching Service Daily Progress Note Intern Pager: [phone]  Patient name: Brent Rogers Medical record number: [id number] Date of birth: 06-09-1966 Age: 55 y.o. Gender: male  Primary Care Provider: Ladell Pier, MD Consultants: GI, Cards Code Status: Full  Pt Overview and Major Events to Date:  6/8 Admitted  Assessment and Plan: Brent Rogers is a 55 y.o. male presenting with abdominal pain . PMH is significant for chronic pancreatitis, Whipple's procedure, HTN, Migraine, Scoliosis, fatty liver disease, normocytic anemia.   Acute on chronic Abdominal pain I Hx of chronic pancreatitis s/p Whipple's (2015) Had repeat episode overnight.  No vomiting.  Will add on half dose maintenance fluids since NPO.  Got dose of Fentanyl overnight.  - GI to see, follow up recs - continue 1/2 mIVF NS  Anemia Hgb stable at 9.1.  No active bleed - Daily CBC    Bradycardia HR in 40's-60's.  Unsure if baseline for patient or not.  Given syncopal episode, anemia and patient did report some diziness to Attending Dr Gwendlyn Deutscher earlier this morning consulted Cards regarding  - follow up Cards recs  FEN/GI: NPO PPx: Lovenox   Status is: Observation  The patient will require care spanning > 2 midnights and should be moved to inpatient because: Ongoing diagnostic testing needed not appropriate for outpatient work up  Dispo: The patient is from: Home              Anticipated d/c is to: Home              Patient currently is not medically stable to d/c.   Difficult to place patient No    Subjective:  Patient indicates abdominal pain flared up last night.    Objective: Temp:  [97.6 F (36.4 C)-98.7 F (37.1 C)] 98.2 F (36.8 C) (06/10 0012) Pulse Rate:  [47-75] 50 (06/10 0012) Resp:  [10-18] 18 (06/10 0012) BP: (106-148)/(33-98) 133/98 (06/10 0504) SpO2:  [95 %-100 %] 99 % (06/10 0012) Physical Exam:    Physical Exam Constitutional:      General: He is not in acute  distress. HENT:     Head: Normocephalic and atraumatic.  Cardiovascular:     Rate and Rhythm: Regular rhythm. Bradycardia present.  Pulmonary:     Effort: Pulmonary effort is normal.     Breath sounds: Normal breath sounds.  Abdominal:     General: Bowel sounds are increased.     Palpations: Abdomen is soft.     Tenderness: There is abdominal tenderness in the left upper quadrant and left lower quadrant.  Skin:    General: Skin is warm.  Neurological:     General: No focal deficit present.     Mental Status: He is alert and oriented to person, place, and time.     Laboratory: Recent Labs  Lab 03/06/21 1836 03/07/21 0139 03/08/21 0103  WBC 7.4 5.9 6.3  HGB 9.1* 9.3* 9.1*  HCT 29.2* 28.6* 28.7*  PLT 267 281 248   Recent Labs  Lab 03/05/21 2227 03/06/21 1805 03/07/21 0139  NA 138 137 139  K 3.8 3.6 3.9  CL 106 105 106  CO2 22 22 29   BUN 5* 5* <5*  CREATININE 0.72 0.75 0.77  CALCIUM 8.4* 8.2* 8.5*  PROT 6.0* 5.4* 5.1*  BILITOT 0.4 0.7 0.5  ALKPHOS 85 80 74  ALT 15 14 14   AST 40 38 30  GLUCOSE 105* 179* 91     Imaging/Diagnostic Tests: No new imaging  Delora Fuel, MD 03/08/2021, 7:57 AM PGY-1, [place] Intern pager: [phone], text pages welcome

## 2021-03-08 NOTE — Progress Notes (Signed)
FPTS Interim Progress Note  Patient sleeping and resting comfortably.  Rounded with primary RN.  No concerns voiced.  No orders required.  Appreciated nightly round.  Today's Vitals   03/07/21 2206 03/07/21 2343 03/08/21 0012 03/08/21 0504  BP: (!) 137/93 (!) 143/91 106/60 (!) 133/98  Pulse: (!) 58 (!) 49 (!) 50   Resp: 11 18 18    Temp: 97.6 F (36.4 C) 98.2 F (36.8 C) 98.2 F (36.8 C)   TempSrc: Oral Oral Oral   SpO2: 99% 98% 99%   Weight:      Height:      PainSc:         RN now reports patient with abdominal pain 9/10.  Too early to give next dose Tramadol. Patient NPO for possible scope today.  Will give Fentanyl 12.5 mg IV x 1 for abdominal pain.  Consider increasing Tramadol frequency Continue to monitor  Carollee Leitz, MD 03/08/2021, 5:09 AM PGY-2, [place] Medicine Service pager [phone]

## 2021-03-08 NOTE — Progress Notes (Addendum)
Patient having persistent dizziness and fatigue. Pt having episodes of bradycardia into the 40s. Have asked the cardiology team to evaluate pt. Pt with dizziness with HR 50 this morning. Await additional cardiology recommendations.   Lyndee Hensen, DO PGY-2, [facility] 03/08/2021 9:33 AM

## 2021-03-08 NOTE — Consult Note (Addendum)
Cardiology Consultation:   Patient ID: ESGAR BARNICK MRN: [id number]; DOB: 08-May-1966  Admit date: 03/05/2021 Date of Consult: 03/08/2021  PCP:  Ladell Pier, MD   [facility] Cardiologist:  Dr. Acie Fredrickson  Patient Profile:   Brent Rogers is a 55 y.o. male with a history of chronic pancreatitis with benign tumor of pancreas s/p Whipple in 11/2013, hypertension, migraine, scoliosis, and tobacco use who is being seen for the evaluation of bradycardia and syncope at the request of Dr. Gwendlyn Deutscher.  History of Present Illness:   Brent Rogers is a 55 year old male with the above history. No known cardiac history. Patient does not actively follow with Cardiology. He was seen by Dr. Acie Fredrickson while in the hospital in 11/2017 for chest pain. Troponin was negative x2. EKG showed no acute change. Chest pain was felt to be very atypical in nature and likely secondary to chronic pancreatitis. No ischemic evaluation was performed. Patient has not been seen by Cardiology since that time.   Patient has had multiple ED visit over the last several months for abdominal pain and nausea/vomiting due to chronic pancreatitis. Patient presented to the ED on 6/7/20222 for further evaluation of abdominal pain with some associated chest pain and syncope. Upon arrival to the ED, patient mildly hypertensive but otherwise vitals stable. EKG showed normal sinus rhythm with no acute ischemic changes. High-sensitivity troponin negative x2. Chest x-ray showed no acute findings. Abdominal/pelvic CT showed no acute findings.  WBC 6.2, Hgb 10.4, Plts 348. Na 138, K 3.8, Glucose 105, BUN 5, Cr 0.72. Albumin 3.1, AST 40, ALT 15, Alk Phos 85, Total Bili 0.4. Lipase normal.  Patient was to the teaching service started on pain medication fluids.  Cardiology consulted for further evaluation of bradycardia and syncope.  GI was also consulted for chronic abdominal pain and nausea and vomiting as well as anemia have recommended  colonoscopy/EGD when stable from a cardiac standpoint.   As noted above patient does have chronic abdominal pain with nausea/vomiting and issues with dehydration due to his chronic pancreatitis.  He was doing okay until about 40 about Friday night (6/3) when he started having pancreatitis flare.  He was unable to keep anything down was vomiting after every time he ate/drink for 2 to 3 days.  The ED for further evaluation.  He states on Sunday he stood up to go to the bathroom and had a syncopal episode.  He states he was having abdominal pain, chest pain, shortness of breath, dizziness, palpitations, diaphoresis and of the next that he knows he was waking up on the floor.  He does not know how long he lost consciousness for.  He denies any syncopal episodes prior to this.  He also reports intermittent left-sided chest pain that he describes as a "squeezing" sensation in and significant dyspnea on exertion over the last 6 months.  Interestingly, this all seemed to start after patient's BP medication (Amlodipine 5mg  daily) was stopped.  Sometimes the dyspnea is preceded by chest pain and other times he has dyspnea by itself.  He sometimes has this chest pain and dyspnea at rest but mostly it is related to exertion.  He states sometimes it will get so bad he has to stop and sit down.  The other day he had to stop and sit on the floor in a grocery store patient says just dyspnea was so bad. Of note, he does states he often gets some chest pain with his pancreatitis flares but  states this left sided chest pian is different. He also reports feeling very fatigued and notes some decreased exercise tolerance.  He notes occasional orthopnea and some PND over the last 6 months.  No lower extremity edema or weight gain.  Notes occasional feel like his heart is racing and frequent episodes of dizziness but again no prior syncopal episodes.  He is intermittent diaphoresis and states he will break out in a cold sweat.  He states  this has been going on since the time of his Whipple in 2015.  He denies any fevers.  No gross bleeding in urine or stools.  He does note his stools are very dark but he does take an iron supplement.  He is currently chest pain-free but does note some shortness of breath at rest.  He is an active smoker and states he smokes 1 to 2 cigarettes a week.  He also notes some alcohol use states he drinks couple days a week usually 2-3 beers at a time. He denies daily alcohol use to me but H&P notes alcohol use disorder and patient has been placed on CIWA protocol. No recreational drug use.  He does have a family history of heart disease with both his brother and sister having CAD requiring stents.  His father also had a "bad heart" but patient could not specify.   Past Medical History:  Diagnosis Date   Benign tumor of endocrine pancreas    Chronic pancreatitis ([place])    S/P Whipple   Foot ulcer with fat layer exposed ([place])    Hypertension    Migraine    "@ least once/month" (11/12/2015)   Pancreatic abnormality    CT  shows mass   Pneumonia 11/12/2015   Scoliosis     Past Surgical History:  Procedure Laterality Date   BACK SURGERY     EUS N/A 09/21/2013   Procedure: ESOPHAGEAL ENDOSCOPIC ULTRASOUND (EUS) RADIAL;  Surgeon: Beryle Beams, MD;  Location: [facility];  Service: Endoscopy;  Laterality: N/A;   EUS N/A 09/30/2013   Procedure: UPPER ENDOSCOPIC ULTRASOUND (EUS) LINEAR;  Surgeon: Beryle Beams, MD;  Location: [facility];  Service: Endoscopy;  Laterality: N/A;   FINE NEEDLE ASPIRATION N/A 09/21/2013   Procedure: FINE NEEDLE ASPIRATION (FNA) LINEAR;  Surgeon: Beryle Beams, MD;  Location: [facility];  Service: Endoscopy;  Laterality: N/A;   FINGER FRACTURE SURGERY Left 1995   5th digit   FRACTURE SURGERY     KNEE ARTHROSCOPY Bilateral 1987-1989   right-left   LAPAROSCOPY N/A 12/01/2013   Procedure: LAPAROSCOPY DIAGNOSTIC PANCREATICODUODENECTOMY WITH BILIARY AND PANCREATIC STENTS;   Surgeon: Stark Klein, MD;  Location: [facility];  Service: General;  Laterality: N/A;   LUMBAR [place] SURGERY  1990's   WHIPPLE PROCEDURE  12/01/2013     Home Medications:  Prior to Admission medications   Medication Sig Start Date End Date Taking? Authorizing Provider  Pancrelipase, Lip-Prot-Amyl, (CREON) 24000-76000 units CPEP Take 1 capsule (24,000 Units total) by mouth with breakfast, with lunch, and with evening meal. 06/22/20  Yes Fulp, Cammie, MD  pantoprazole (PROTONIX) 40 MG tablet TAKE 1 TABLET (40 MG TOTAL) BY MOUTH DAILY. 09/01/20 09/01/21 Yes Kyle, Tyrone A, DO  promethazine (PHENERGAN) 25 MG suppository PLACE 1 SUPPOSITORY (25 MG TOTAL) RECTALLY EVERY 6 (SIX) HOURS AS NEEDED FOR NAUSEA OR VOMITING. 12/08/20 12/08/21 Yes Tyrone Nine, Dan, DO  dicyclomine (BENTYL) 20 MG tablet TAKE 1 TABLET BY MOUTH UP TO 4 TIMES PER DAY AS NEEDED FOR ABDOMINAL PAIN  Patient not taking: Reported on 03/07/2021 06/22/20 06/22/21  Fulp, Ander Gaster, MD  ferrous sulfate 325 (65 FE) MG tablet Take 1 tablet (325 mg total) by mouth daily. 09/01/20 10/01/20  Cherylann Ratel A, DO  ondansetron (ZOFRAN ODT) 4 MG disintegrating tablet Take 1 tablet (4 mg total) by mouth every 8 (eight) hours as needed for nausea or vomiting. Patient not taking: Reported on 03/07/2021 01/19/21   Montine Circle, PA-C    Inpatient Medications: Scheduled Meds:  enoxaparin (LOVENOX) injection  40 mg Subcutaneous Q24H   ferrous sulfate  325 mg Oral Daily   lipase/protease/amylase  24,000 Units Oral TID WC   nicotine  21 mg Transdermal Daily   ondansetron  4 mg Oral Once   pantoprazole  40 mg Oral Daily   polyethylene glycol  17 g Oral BID   senna  1 tablet Oral Daily   Continuous Infusions:  sodium chloride 50 mL/hr at 03/08/21 1400   PRN Meds: dicyclomine, traMADol  Allergies:    Allergies  Allergen Reactions   No Known Allergies     Social History:   Social History   Socioeconomic History   Marital status: Divorced    Spouse name: Not on  file   Number of children: Not on file   Years of education: Not on file   Highest education level: Not on file  Occupational History   Not on file  Tobacco Use   Smoking status: Every Day    Packs/day: 0.25    Years: 30.00    Pack years: 7.50    Types: Cigarettes   Smokeless tobacco: Former    Types: Snuff   Tobacco comments:    "used snuff 10-15 years in my 20s-30s"  Vaping Use   Vaping Use: Never used  Substance and Sexual Activity   Alcohol use: Yes    Alcohol/week: 4.0 standard drinks    Types: 4 Cans of beer per week    Comment: weekly   Drug use: No   Sexual activity: Not Currently    Partners: Male  Other Topics Concern   Not on file  Social History Narrative   Not on file   Social Determinants of Health   Financial Resource Strain: Not on file  Food Insecurity: Not on file  Transportation Needs: Not on file  Physical Activity: Not on file  Stress: Not on file  Social Connections: Not on file  Intimate Partner Violence: Not on file    Family History:    Family History  Problem Relation Age of Onset   Cancer Mother        unsure   COPD Mother    Hypertension Mother    Hypertension Father    COPD Father    CAD Sister        possible has stent per pt     ROS:  Please see the history of present illness.  All other ROS reviewed and negative.     Physical Exam/Data:   Vitals:   03/08/21 0012 03/08/21 0504 03/08/21 0915 03/08/21 1203  BP: 106/60 (!) 133/98 (!) 152/98 (!) 137/97  Pulse: (!) 50  (!) 48 61  Resp: $Remo'18  14 14  'NzJEL$ Temp: 98.2 F (36.8 C)  97.7 F (36.5 C) 98 F (36.7 C)  TempSrc: Oral  Oral Oral  SpO2: 99%  100% 100%  Weight:      Height:         Intake/Output Summary (Last 24 hours) at 03/08/2021 1431 Last data filed  at 03/08/2021 0930 Gross per 24 hour  Intake --  Output 425 ml  Net -425 ml   Last 3 Weights 03/06/2021 03/05/2021 02/02/2021  Weight (lbs) 133 lb 13.1 oz 154 lb 15.7 oz 155 lb  Weight (kg) 60.7 kg 70.3 kg 70.308 kg      Body mass index is 16.29 kg/m.  General: 55 y.o. thin African-American male resting comfortably in no acute distress.  HEENT: Normocephalic and atraumatic. Sclera clear.  Neck: Supple. No carotid bruits. No JVD. Heart: Mildly bradycardic with regular rhythm. Distinct S1 and S2. No murmurs, gallops, or rubs. Radial pulses 2+ and equal bilaterally. Lungs: No increased work of breathing. Clear to ausculation bilaterally. No wheezes, rhonchi, or rales.  Abdomen: Soft, non-distended, and tender to palpation especially on the left side.Bowel sounds present. Extremities: No lower extremity edema.    Skin: Warm and dry. Neuro: Alert and oriented x3. No focal deficits. Psych: Normal affect. Responds appropriately.  EKG:  The following EKGs were personally reviewed: - EKG on 03/05/2021 showed normal sinus rhythm, rate 85 bpm, with non-specific T wave changes in lead aVL but no acute ST/T changes. Normal axis. Normal PR and QRS intervals. QTc 449 ms. - EKG on 03/07/2021 showed sinus bradycardia, rate 45 bpm, with isolated T wave inversion in lead aVL (seen on prior tracings) but no acute ST/T changes. Normal axis. Normal PR and QRS intervals. QTc 430 ms.  Telemetry:  Telemetry was personally reviewed and demonstrates: Sinus rhythm with baseline line rates in the 40s to 50s occasionally up to the 70s. He did have a brief run of what looks like a PAT with rates in the 130s.  Relevant CV Studies:  Echocardiogram 03/07/2021: Impressions:  1. Left ventricular ejection fraction, by estimation, is 60 to 65%. The  left ventricle has normal function. The left ventricle has no regional  wall motion abnormalities. Left ventricular diastolic parameters were  normal.   2. Right ventricular systolic function is normal. The right ventricular  size is normal.   3. Left atrial size was mildly dilated.   4. The mitral valve is normal in structure. Trivial mitral valve  regurgitation.   5. The aortic valve is  tricuspid. Aortic valve regurgitation is not  visualized. Mild aortic valve sclerosis is present, with no evidence of  aortic valve stenosis.   6. The inferior vena cava is dilated in size with >50% respiratory  variability, suggesting right atrial pressure of 8 mmHg.   Laboratory Data:  High Sensitivity Troponin:   Recent Labs  Lab 03/05/21 2227 03/06/21 0043  TROPONINIHS 5 4     Chemistry Recent Labs  Lab 03/05/21 2227 03/06/21 1805 03/07/21 0139  NA 138 137 139  K 3.8 3.6 3.9  CL 106 105 106  CO2 $Re'22 22 29  'OEU$ GLUCOSE 105* 179* 91  BUN 5* 5* <5*  CREATININE 0.72 0.75 0.77  CALCIUM 8.4* 8.2* 8.5*  GFRNONAA >60 >60 >60  ANIONGAP 10 10 4*    Recent Labs  Lab 03/05/21 2227 03/06/21 1805 03/07/21 0139  PROT 6.0* 5.4* 5.1*  ALBUMIN 3.1* 2.8* 2.6*  AST 40 38 30  ALT $Re'15 14 14  'yPL$ ALKPHOS 85 80 74  BILITOT 0.4 0.7 0.5   Hematology Recent Labs  Lab 03/06/21 1836 03/07/21 0139 03/08/21 0103  WBC 7.4 5.9 6.3  RBC 3.39* 3.37* 3.42*  HGB 9.1* 9.3* 9.1*  HCT 29.2* 28.6* 28.7*  MCV 86.1 84.9 83.9  MCH 26.8 27.6 26.6  MCHC 31.2 32.5 31.7  RDW 20.7* 20.8* 20.5*  PLT 267 281 248   BNP Recent Labs  Lab 03/06/21 2018  BNP 144.5*    DDimer No results for input(s): DDIMER in the last 168 hours.   Radiology/Studies:  DG Chest 2 View  Result Date: 03/05/2021 CLINICAL DATA:  Chest pain after vomiting. EXAM: CHEST - 2 VIEW COMPARISON:  May 29, 2020 FINDINGS: The heart size and mediastinal contours are within normal limits. Both lungs are clear. The visualized skeletal structures are unremarkable. IMPRESSION: No active cardiopulmonary disease. Electronically Signed   By: Virgina Norfolk M.D.   On: 03/05/2021 23:09   CT ABDOMEN PELVIS W CONTRAST  Result Date: 03/06/2021 CLINICAL DATA:  Abdominal pain, nausea, vomiting EXAM: CT ABDOMEN AND PELVIS WITH CONTRAST TECHNIQUE: Multidetector CT imaging of the abdomen and pelvis was performed using the standard protocol following  bolus administration of intravenous contrast. CONTRAST:  27mL OMNIPAQUE IOHEXOL 300 MG/ML  SOLN COMPARISON:  01/19/2021 FINDINGS: Lower chest: Lung bases are clear. No effusions. Heart is normal size. Hepatobiliary: No focal liver abnormality is seen. Status post cholecystectomy. No biliary dilatation. Pancreas: No focal abnormality or ductal dilatation. Prior Whipple procedure. Spleen: No focal abnormality.  Normal size. Adrenals/Urinary Tract: Few scattered cysts bilaterally. No hydronephrosis or adrenal glands and urinary bladder unremarkable. Stomach/Bowel: Stomach, large and small bowel grossly unremarkable. Postoperative changes from prior Whipple procedure. Gastrojejunostomy unremarkable. Vascular/Lymphatic: Aortic atherosclerosis. No evidence of aneurysm or adenopathy. Reproductive: No visible focal abnormality. Other: No free fluid or free air. Musculoskeletal: No acute bony abnormality. IMPRESSION: No acute findings in the abdomen or pelvis. Prior Whipple procedure.  No complicating feature. Aortic atherosclerosis. Electronically Signed   By: Rolm Baptise M.D.   On: 03/06/2021 11:49   ECHOCARDIOGRAM COMPLETE  Result Date: 03/07/2021    ECHOCARDIOGRAM REPORT   Patient Name:   Brent Rogers Date of Exam: 03/07/2021 Medical Rec #:  [id number]        Height:       76.0 in Accession #:    [id number]       Weight:       133.8 lb Date of Birth:  June 08, 1966         BSA:          1.867 m Patient Age:    71 years         BP:           124/84 mmHg Patient Gender: M                HR:           47 bpm. Exam Location:  Inpatient Procedure: 2D Echo, Cardiac Doppler and Color Doppler Indications:    Dyspnea  History:        Patient has no prior history of Echocardiogram examinations.                 Signs/Symptoms:Syncope; Risk Factors:Hypertension and Current                 Smoker. GERD.  Sonographer:    Clayton Lefort RDCS (AE) Referring Phys: [id number] [place]  1. Left ventricular ejection fraction,  by estimation, is 60 to 65%. The left ventricle has normal function. The left ventricle has no regional wall motion abnormalities. Left ventricular diastolic parameters were normal.  2. Right ventricular systolic function is normal. The right ventricular size is normal.  3. Left atrial size was mildly dilated.  4. The mitral valve is normal in  structure. Trivial mitral valve regurgitation.  5. The aortic valve is tricuspid. Aortic valve regurgitation is not visualized. Mild aortic valve sclerosis is present, with no evidence of aortic valve stenosis.  6. The inferior vena cava is dilated in size with >50% respiratory variability, suggesting right atrial pressure of 8 mmHg. FINDINGS  Left Ventricle: Left ventricular ejection fraction, by estimation, is 60 to 65%. The left ventricle has normal function. The left ventricle has no regional wall motion abnormalities. The left ventricular internal cavity size was normal in size. There is  no left ventricular hypertrophy. Left ventricular diastolic parameters were normal. Right Ventricle: The right ventricular size is normal. No increase in right ventricular wall thickness. Right ventricular systolic function is normal. Left Atrium: Left atrial size was mildly dilated. Right Atrium: Right atrial size was normal in size. Pericardium: There is no evidence of pericardial effusion. Mitral Valve: The mitral valve is normal in structure. Trivial mitral valve regurgitation. MV peak gradient, 2.2 mmHg. The mean mitral valve gradient is 1.0 mmHg. Tricuspid Valve: The tricuspid valve is normal in structure. Tricuspid valve regurgitation is trivial. Aortic Valve: The aortic valve is tricuspid. Aortic valve regurgitation is not visualized. Mild aortic valve sclerosis is present, with no evidence of aortic valve stenosis. Aortic valve mean gradient measures 2.0 mmHg. Aortic valve peak gradient measures 4.2 mmHg. Aortic valve area, by VTI measures 2.57 cm. Pulmonic Valve: The pulmonic  valve was not well visualized. Aorta: The aortic root is normal in size and structure. Venous: The inferior vena cava is dilated in size with greater than 50% respiratory variability, suggesting right atrial pressure of 8 mmHg. IAS/Shunts: No atrial level shunt detected by color flow Doppler.  LEFT VENTRICLE PLAX 2D LVIDd:         4.80 cm  Diastology LVIDs:         3.20 cm  LV e' medial:    8.16 cm/s LV PW:         1.20 cm  LV E/e' medial:  9.8 LV IVS:        1.20 cm  LV e' lateral:   10.40 cm/s LVOT diam:     2.20 cm  LV E/e' lateral: 7.7 LV SV:         65 LV SV Index:   35 LVOT Area:     3.80 cm  RIGHT VENTRICLE             IVC RV Basal diam:  3.20 cm     IVC diam: 2.30 cm RV S prime:     13.70 cm/s TAPSE (M-mode): 2.5 cm LEFT ATRIUM           Index       RIGHT ATRIUM           Index LA diam:      3.10 cm 1.66 cm/m  RA Area:     16.50 cm LA Vol (A2C): 64.9 ml 34.75 ml/m RA Volume:   45.60 ml  24.42 ml/m LA Vol (A4C): 38.6 ml 20.67 ml/m  AORTIC VALVE AV Area (Vmax):    2.75 cm AV Area (Vmean):   2.44 cm AV Area (VTI):     2.57 cm AV Vmax:           102.00 cm/s AV Vmean:          71.300 cm/s AV VTI:            0.253 m AV Peak Grad:      4.2 mmHg  AV Mean Grad:      2.0 mmHg LVOT Vmax:         73.80 cm/s LVOT Vmean:        45.700 cm/s LVOT VTI:          0.171 m LVOT/AV VTI ratio: 0.68  AORTA Ao Root diam: 3.70 cm MITRAL VALVE MV Area (PHT): 3.65 cm    SHUNTS MV Area VTI:   2.50 cm    Systemic VTI:  0.17 m MV Peak grad:  2.2 mmHg    Systemic Diam: 2.20 cm MV Mean grad:  1.0 mmHg MV Vmax:       0.75 m/s MV Vmean:      30.0 cm/s MV Decel Time: 208 msec MV E velocity: 79.70 cm/s MV A velocity: 45.00 cm/s MV E/A ratio:  1.77 Fransico Him MD Electronically signed by Fransico Him MD Signature Date/Time: 03/07/2021/4:27:21 PM    Final      Assessment and Plan:   Syncope - Patient had a syncopal episode on 6/5 after 2 days of nausea and vomiting. Prodromal symptoms included severe abdominal pain, chest pain,  shortness of breath, palpitations, and dizziness.  - Echo showed normal LVEF of 60-65% with normal wall motion and normal diastolic function. No significant valvular disease noted. - Telemetry shows sinus bradycardia with rates as low as the 40s. No concerning ventricular arrhythmias, high grade AV block, or ventricular arrhythmias.  - Orthostatics negative per OT note on 6/9. This was after patient had already received 2 L of IV fluids.  - Sounds very consistent with vasovagal syncope due to dehydration/ low intervascular volume.   Sinus Bradycardia - Rates in the high 40s to 50s.  - TSH normal.  - Magnesium slightly low a 1.6 on 6/8. Repleted. Will recheck this.  - Potassium normal.  - Echo showed normal LV function.  - I do not think syncope and dizziness is related to sinus bradycardia. I think it is more likely due to frequent dehydration from chronic pancreatitis with nausea and vomiting. No indication for pacemaker at this time.   Chest Pain Dyspnea on Exertion Decreased Exercise Tolerance - Patient reports intermittent left sided chest pain and significant dyspnea on exertion over the last 6 months. Also notes decreased exercise tolerance and fatigue. No prior ischemic evaluation but mild coronary artery calcifications noted on prior chest CTA in 2019.  - EKG showed isolated T wave changes in lead aVL which has been noted on prior tracings. No acute changes.  - High-sensitivity troponin negative x2.  - Echo showed normal LV function, normal wall motion, and normal diastolic function.  - BNP minimally elevated at 144. Euvolemic on exam.  - Currently chest pain free.  - Will plan for coronary CTA to rule out significant CAD.   Of note, GI has recommended a colonoscopy/EGD so we will get coronary CTA before this.   Hypertension - BP mildly elevated at times (mostly diastolic).  - Previously on Amlodipine $RemoveBefor'5mg'[id number]$  daily but this was stopped several months ago. - Would be careful adding  back any medication given frequent problems with dehydration  Otherwise, per primary team: - Chronic pancreatitis with chronic abdominal pain/nausea/vomiting:  - Dysphagia - GERD - Iron deficiency anemia - Depression  Risk Assessment/Risk Scores:   HEAR Score (for undifferentiated chest pain):  HEAR Score: 4{  New York Heart Association (NYHA) Functional Class NYHA Class III   For questions or updates, please contact [facility] Please consult [email or web address] for contact info under  Signed, Darreld Mclean, PA-C  03/08/2021 2:31 PM  Attending Note:   The patient was seen and examined.  Agree with assessment and plan as noted above.  Changes made to the above note as needed.  Patient seen and independently examined with Lindon Romp, PA .   We discussed all aspects of the encounter. I agree with the assessment and plan as stated above.   Sinus bradycardia: The patient has benign sinus bradycardia at rest.  I stood him up and walked him for about a minute.  His heart rate very quickly got up to 80.  He had no syncope or presyncope.  From this I can tell that his chronotropic response is intact.  He has not had any episodes of syncope except for an episode of orthostatic hypotension that he had after he had nausea and vomiting for 2 days.  This does not require any additional work-up at this time.  2.  History of syncope: He had an episode of orthostatic hypotension.  This occurred after he had had 2 days of very poor p.o. intake along with nausea and vomiting.  Since his abdominal pain, nausea and vomiting have resolved, he has not had any additional episodes of his orthostasis.  This is not a cardiac issue but rather an issue with his chronic pancreatitis, nausea and vomiting.  3.  Chest discomfort: He describes some chest discomfort as well as some shortness of breath with exertion.  The symptoms seem to be progressing over the past 6 months.  We will do a coronary CT  angiogram tomorrow.   I have spent a total of 40 minutes with patient reviewing hospital  notes , telemetry, EKGs, labs and examining patient as well as establishing an assessment and plan that was discussed with the patient.  > 50% of time was spent in direct patient care.    Thayer Headings, Brooke Bonito., MD, [facility] 03/08/2021, 3:32 PM [address],  [place] Pager [phone]

## 2021-03-08 NOTE — Consult Note (Signed)
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  Work up in the hospital with imaging does not show any acute abnormalities.  The patient has multiple admission for his chronic pancreatitis and he feels that he currently averages an ER visit once per month.  He typically is treated and monitored in the ER and then discharged home.  His current presentation for his abdominal pain is the same as his prior presentations, but the symptoms are more intense and longer lasting.  This past Sunday he reported an acute onset of the pain in his abdomen and chest.  At some point he reported syncope and he does not know how long he was out.  He presented to the ER this past Tuesday, but then he was discharged home.  He represented on Wednesday with his persistent symptoms.  Earlier in the year, 10/2020 he presented to [facility] for his chronic pancreatitis and at that time he reported having "flecks" of blood in his stool.  The patient denies having a prior history of bleeding and he denies having a prior colonoscopy.  Over the years his HGB is trending downwards, but he tends to have a wide variation from 8-15 g/dL.  The patient does not report hematemesis or melena.  He states that he has GERD and at times he will suffer with intermittent dysphagia.  The patient does continue to drink ETOH in the form of beer and he smokes.  Past Medical History:  Diagnosis Date   Benign tumor of endocrine pancreas    Chronic pancreatitis (HCC)    S/P Whipple   Foot ulcer with fat layer exposed (HCC)    Hypertension    Migraine    "@ least once/month" (11/12/2015)   Pancreatic abnormality    CT  shows mass   Pneumonia 11/12/2015   Scoliosis     Past Surgical History:   Procedure Laterality Date   BACK SURGERY     EUS N/A 09/21/2013   Procedure: ESOPHAGEAL ENDOSCOPIC ULTRASOUND (EUS) RADIAL;  Surgeon: Theda Belfast, MD;  Location: [facility];  Service: Endoscopy;  Laterality: N/A;   EUS N/A 09/30/2013   Procedure: UPPER ENDOSCOPIC ULTRASOUND (EUS) LINEAR;  Surgeon: Theda Belfast, MD;  Location: [facility];  Service: Endoscopy;  Laterality: N/A;   FINE NEEDLE ASPIRATION N/A 09/21/2013   Procedure: FINE NEEDLE ASPIRATION (FNA) LINEAR;  Surgeon: Theda Belfast, MD;  Location: [facility];  Service: Endoscopy;  Laterality: N/A;   FINGER FRACTURE SURGERY Left 1995   5th digit   FRACTURE SURGERY     KNEE ARTHROSCOPY Bilateral 1987-1989   right-left   LAPAROSCOPY N/A 12/01/2013   Procedure: LAPAROSCOPY DIAGNOSTIC PANCREATICODUODENECTOMY WITH BILIARY AND PANCREATIC STENTS;  Surgeon: Almond Lint, MD;  Location: [facility];  Service: General;  Laterality: N/A;   LUMBAR DISC SURGERY  1990's   WHIPPLE PROCEDURE  12/01/2013    Family History  Problem Relation Age of Onset   Cancer Mother        unsure   COPD Mother    Hypertension Mother    Hypertension Father    COPD Father    CAD Sister        possible has stent per pt    Social History:  reports  that he has been smoking cigarettes. He has a 7.50 pack-year smoking history. He has quit using smokeless tobacco.  His smokeless tobacco use included snuff. He reports current alcohol use of about 4.0 standard drinks of alcohol per week. He reports that he does not use drugs.  Allergies:  Allergies  Allergen Reactions   No Known Allergies     Medications: Scheduled:  enoxaparin (LOVENOX) injection  40 mg Subcutaneous Q24H   ferrous sulfate  325 mg Oral Daily   lipase/protease/amylase  24,000 Units Oral TID WC   nicotine  21 mg Transdermal Daily   ondansetron  4 mg Oral Once   pantoprazole  40 mg Oral Daily   polyethylene glycol  17 g Oral BID   senna  1 tablet Oral Daily   Continuous:  sodium  chloride 50 mL/hr at 03/08/21 1000    Results for orders placed or performed during the hospital encounter of 03/05/21 (from the past 24 hour(s))  CBC     Status: Abnormal   Collection Time: 03/08/21  1:03 AM  Result Value Ref Range   WBC 6.3 4.0 - 10.5 K/uL   RBC 3.42 (L) 4.22 - 5.81 MIL/uL   Hemoglobin 9.1 (L) 13.0 - 17.0 g/dL   HCT 08.6 (L) 57.8 - 46.9 %   MCV 83.9 80.0 - 100.0 fL   MCH 26.6 26.0 - 34.0 pg   MCHC 31.7 30.0 - 36.0 g/dL   RDW 62.9 (H) 52.8 - 41.3 %   Platelets 248 150 - 400 K/uL   nRBC 0.0 0.0 - 0.2 %     CT ABDOMEN PELVIS W CONTRAST  Result Date: 03/06/2021 CLINICAL DATA:  Abdominal pain, nausea, vomiting EXAM: CT ABDOMEN AND PELVIS WITH CONTRAST TECHNIQUE: Multidetector CT imaging of the abdomen and pelvis was performed using the standard protocol following bolus administration of intravenous contrast. CONTRAST:  80mL OMNIPAQUE IOHEXOL 300 MG/ML  SOLN COMPARISON:  01/19/2021 FINDINGS: Lower chest: Lung bases are clear. No effusions. Heart is normal size. Hepatobiliary: No focal liver abnormality is seen. Status post cholecystectomy. No biliary dilatation. Pancreas: No focal abnormality or ductal dilatation. Prior Whipple procedure. Spleen: No focal abnormality.  Normal size. Adrenals/Urinary Tract: Few scattered cysts bilaterally. No hydronephrosis or adrenal glands and urinary bladder unremarkable. Stomach/Bowel: Stomach, large and small bowel grossly unremarkable. Postoperative changes from prior Whipple procedure. Gastrojejunostomy unremarkable. Vascular/Lymphatic: Aortic atherosclerosis. No evidence of aneurysm or adenopathy. Reproductive: No visible focal abnormality. Other: No free fluid or free air. Musculoskeletal: No acute bony abnormality. IMPRESSION: No acute findings in the abdomen or pelvis. Prior Whipple procedure.  No complicating feature. Aortic atherosclerosis. Electronically Signed   By: Charlett Nose M.D.   On: 03/06/2021 11:49   ECHOCARDIOGRAM  COMPLETE  Result Date: 03/07/2021    ECHOCARDIOGRAM REPORT   Patient Name:   Brent Rogers Date of Exam: 03/07/2021 Medical Rec #:  [id number]        Height:       76.0 in Accession #:    [id number]       Weight:       133.8 lb Date of Birth:  12/12/1965         BSA:          1.867 m Patient Age:    55 years         BP:           124/84 mmHg Patient Gender: M  HR:           47 bpm. Exam Location:  Inpatient Procedure: 2D Echo, Cardiac Doppler and Color Doppler Indications:    Dyspnea  History:        Patient has no prior history of Echocardiogram examinations.                 Signs/Symptoms:Syncope; Risk Factors:Hypertension and Current                 Smoker. GERD.  Sonographer:    Ross Ludwig RDCS (AE) Referring Phys: [id number] MICHAEL C BUTLER IMPRESSIONS  1. Left ventricular ejection fraction, by estimation, is 60 to 65%. The left ventricle has normal function. The left ventricle has no regional wall motion abnormalities. Left ventricular diastolic parameters were normal.  2. Right ventricular systolic function is normal. The right ventricular size is normal.  3. Left atrial size was mildly dilated.  4. The mitral valve is normal in structure. Trivial mitral valve regurgitation.  5. The aortic valve is tricuspid. Aortic valve regurgitation is not visualized. Mild aortic valve sclerosis is present, with no evidence of aortic valve stenosis.  6. The inferior vena cava is dilated in size with >50% respiratory variability, suggesting right atrial pressure of 8 mmHg. FINDINGS  Left Ventricle: Left ventricular ejection fraction, by estimation, is 60 to 65%. The left ventricle has normal function. The left ventricle has no regional wall motion abnormalities. The left ventricular internal cavity size was normal in size. There is  no left ventricular hypertrophy. Left ventricular diastolic parameters were normal. Right Ventricle: The right ventricular size is normal. No increase in right ventricular wall  thickness. Right ventricular systolic function is normal. Left Atrium: Left atrial size was mildly dilated. Right Atrium: Right atrial size was normal in size. Pericardium: There is no evidence of pericardial effusion. Mitral Valve: The mitral valve is normal in structure. Trivial mitral valve regurgitation. MV peak gradient, 2.2 mmHg. The mean mitral valve gradient is 1.0 mmHg. Tricuspid Valve: The tricuspid valve is normal in structure. Tricuspid valve regurgitation is trivial. Aortic Valve: The aortic valve is tricuspid. Aortic valve regurgitation is not visualized. Mild aortic valve sclerosis is present, with no evidence of aortic valve stenosis. Aortic valve mean gradient measures 2.0 mmHg. Aortic valve peak gradient measures 4.2 mmHg. Aortic valve area, by VTI measures 2.57 cm. Pulmonic Valve: The pulmonic valve was not well visualized. Aorta: The aortic root is normal in size and structure. Venous: The inferior vena cava is dilated in size with greater than 50% respiratory variability, suggesting right atrial pressure of 8 mmHg. IAS/Shunts: No atrial level shunt detected by color flow Doppler.  LEFT VENTRICLE PLAX 2D LVIDd:         4.80 cm  Diastology LVIDs:         3.20 cm  LV e' medial:    8.16 cm/s LV PW:         1.20 cm  LV E/e' medial:  9.8 LV IVS:        1.20 cm  LV e' lateral:   10.40 cm/s LVOT diam:     2.20 cm  LV E/e' lateral: 7.7 LV SV:         65 LV SV Index:   35 LVOT Area:     3.80 cm  RIGHT VENTRICLE             IVC RV Basal diam:  3.20 cm     IVC diam: 2.30 cm RV S  prime:     13.70 cm/s TAPSE (M-mode): 2.5 cm LEFT ATRIUM           Index       RIGHT ATRIUM           Index LA diam:      3.10 cm 1.66 cm/m  RA Area:     16.50 cm LA Vol (A2C): 64.9 ml 34.75 ml/m RA Volume:   45.60 ml  24.42 ml/m LA Vol (A4C): 38.6 ml 20.67 ml/m  AORTIC VALVE AV Area (Vmax):    2.75 cm AV Area (Vmean):   2.44 cm AV Area (VTI):     2.57 cm AV Vmax:           102.00 cm/s AV Vmean:          71.300 cm/s AV  VTI:            0.253 m AV Peak Grad:      4.2 mmHg AV Mean Grad:      2.0 mmHg LVOT Vmax:         73.80 cm/s LVOT Vmean:        45.700 cm/s LVOT VTI:          0.171 m LVOT/AV VTI ratio: 0.68  AORTA Ao Root diam: 3.70 cm MITRAL VALVE MV Area (PHT): 3.65 cm    SHUNTS MV Area VTI:   2.50 cm    Systemic VTI:  0.17 m MV Peak grad:  2.2 mmHg    Systemic Diam: 2.20 cm MV Mean grad:  1.0 mmHg MV Vmax:       0.75 m/s MV Vmean:      30.0 cm/s MV Decel Time: 208 msec MV E velocity: 79.70 cm/s MV A velocity: 45.00 cm/s MV E/A ratio:  1.77 Armanda Magic MD Electronically signed by Armanda Magic MD Signature Date/Time: 03/07/2021/4:27:21 PM    Final     ROS:  As stated above in the HPI otherwise negative.  Blood pressure (!) 152/98, pulse (!) 48, temperature 97.7 F (36.5 C), temperature source Oral, resp. rate 14, height 6\' 4"  (1.93 m), weight 60.7 kg, SpO2 100 %.    PE: Gen: NAD, Alert and Oriented HEENT:  [place]/AT, EOMI Neck: Supple, no LAD Lungs: CTA Bilaterally CV: RRR without M/G/R ABD: Soft, minor lower abdominal pain, midline incisional scar, +BS Ext: No C/C/E  Assessment/Plan: 1) Chronic pancreatitis. 2) Worsening abdominal pain. 3) Bradycardia and recent report of syncope. 4) Report of minor hematochezia.  (10/2020 hemoccult was negative) 5) Dysphagia.    His symptomatology for his abdominal pain is not different from his prior presentations.  It is more severe at this time.  Currently he is not in any distress.  There is some minor abdominal pain with palpation of the lower abdomen.  He does report intermittent dysphagia and some minor hematochezia in the recent past.  It is reasonable to perform an EGD/colonoscopy for further evaluation, however, there is an issue with bradycardia.  Cardiology was consulted to evaluate this issue.  Plan: 1) Await Cardiology evaluation. 2) EGD/colonoscopy when he is cleared by Cardiology. Graham Doukas D 03/08/2021, 10:20 AM

## 2021-03-08 NOTE — TOC Initial Note (Signed)
Transition of Care [facility]) - Initial/Assessment Note    Patient Details  Name: Brent Rogers MRN: [id number] Date of Birth: Feb 03, 1966  Transition of Care [facility]) CM/SW Contact:    Verdell Carmine, RN Phone Number: 03/08/2021, 11:30 AM  Clinical Narrative:                 55 YO male history of chronic pancreatitis and whipple procedure, comes in to ED every month, Co Abdominal pain. Patient is anemic, c/o syncopal episode, bradycardic in 40's and 50's caardiology consult. .Has Medicaid for insurance. CM will follow for any needs.Continues to consume alchohol.  Expected Discharge Plan: Home/Self Care Barriers to Discharge: Continued Medical Work up   Patient Goals and CMS Choice        Expected Discharge Plan and Services Expected Discharge Plan: Home/Self Care   Discharge Planning Services: CM Consult   Living arrangements for the past 2 months: Single Family Home                                      Prior Living Arrangements/Services Living arrangements for the past 2 months: Single Family Home   Patient language and need for interpreter reviewed:: Yes        Need for Family Participation in Patient Care: Yes (Comment)     Criminal Activity/Legal Involvement Pertinent to Current Situation/Hospitalization: No - Comment as needed  Activities of Daily Living      Permission Sought/Granted                  Emotional Assessment       Orientation: : Oriented to Self, Oriented to Place, Oriented to  Time, Oriented to Situation Alcohol / Substance Use: Alcohol Use Psych Involvement: No (comment)  Admission diagnosis:  Syncope [R55] 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 [place] [address] Phone: [phone] Fax: [phone]  [place], [place] [address] [place] [address] Phone: [phone] Fax: [phone]     Social Determinants of Health (SDOH) Interventions    Readmission Risk Interventions No flowsheet data found.

## 2021-03-09 DIAGNOSIS — I10 Essential (primary) hypertension: Secondary | ICD-10-CM

## 2021-03-09 DIAGNOSIS — K861 Other chronic pancreatitis: Secondary | ICD-10-CM

## 2021-03-09 DIAGNOSIS — I2510 Atherosclerotic heart disease of native coronary artery without angina pectoris: Secondary | ICD-10-CM

## 2021-03-09 DIAGNOSIS — D509 Iron deficiency anemia, unspecified: Secondary | ICD-10-CM

## 2021-03-09 LAB — CBC
HCT: 27.4 % — ABNORMAL LOW (ref 39.0–52.0)
HCT: 28 % — ABNORMAL LOW (ref 39.0–52.0)
Hemoglobin: 8.8 g/dL — ABNORMAL LOW (ref 13.0–17.0)
Hemoglobin: 8.9 g/dL — ABNORMAL LOW (ref 13.0–17.0)
MCH: 26.9 pg (ref 26.0–34.0)
MCH: 26.7 pg (ref 26.0–34.0)
MCHC: 32.1 g/dL (ref 30.0–36.0)
MCHC: 31.8 g/dL (ref 30.0–36.0)
MCV: 83.8 fL (ref 80.0–100.0)
MCV: 84.1 fL (ref 80.0–100.0)
Platelets: 223 10*3/uL (ref 150–400)
Platelets: 200 10*3/uL (ref 150–400)
RBC: 3.27 MIL/uL — ABNORMAL LOW (ref 4.22–5.81)
RBC: 3.33 MIL/uL — ABNORMAL LOW (ref 4.22–5.81)
RDW: 20 % — ABNORMAL HIGH (ref 11.5–15.5)
RDW: 20.3 % — ABNORMAL HIGH (ref 11.5–15.5)
WBC: 8.2 10*3/uL (ref 4.0–10.5)
WBC: 8.1 10*3/uL (ref 4.0–10.5)
nRBC: 0 % (ref 0.0–0.2)
nRBC: 0 % (ref 0.0–0.2)

## 2021-03-09 LAB — BASIC METABOLIC PANEL
Anion gap: 7 (ref 5–15)
BUN: <5 mg/dL — ABNORMAL LOW (ref 6–20)
CO2: 24 mmol/L (ref 22–32)
Calcium: 8 mg/dL — ABNORMAL LOW (ref 8.9–10.3)
Chloride: 104 mmol/L (ref 98–111)
Creatinine, Ser: 0.75 mg/dL (ref 0.61–1.24)
GFR, Estimated: >60 mL/min (ref >60)
Glucose, Bld: 103 mg/dL — ABNORMAL HIGH (ref 70–99)
Potassium: 3.8 mmol/L (ref 3.5–5.1)
Sodium: 135 mmol/L (ref 135–145)

## 2021-03-09 LAB — MAGNESIUM: Magnesium: 1.5 mg/dL — ABNORMAL LOW (ref 1.7–2.4)

## 2021-03-09 MED ORDER — PEG-KCL-NACL-NASULF-NA ASC-C 100 G PO SOLR: 1.0000 | Freq: Once | ORAL | Status: DC

## 2021-03-09 MED ORDER — SODIUM CHLORIDE 0.9 % IV SOLN: INTRAVENOUS | Status: AC

## 2021-03-09 MED ORDER — MAGNESIUM SULFATE 2 GM/50ML IV SOLN
2.0000 g | Freq: Once | INTRAVENOUS | Status: AC
  Administered 2021-03-09: 2 g via INTRAVENOUS
  Filled 2021-03-09: qty 50

## 2021-03-09 MED ORDER — PEG-KCL-NACL-NASULF-NA ASC-C 100 G PO SOLR
0.5000 | Freq: Once | ORAL | Status: DC
  Filled 2021-03-09: qty 1

## 2021-03-09 MED ORDER — FENTANYL CITRATE (PF) 100 MCG/2ML IJ SOLN
12.5000 ug | Freq: Once | INTRAMUSCULAR | Status: AC
  Administered 2021-03-09: 12.5 ug via INTRAVENOUS
  Filled 2021-03-09: qty 2

## 2021-03-09 MED ORDER — SENNA 8.6 MG PO TABS
2.0000 | ORAL_TABLET | Freq: Every day | ORAL | Status: DC
  Administered 2021-03-10: 17.2 mg via ORAL
  Filled 2021-03-09 (×2): qty 2

## 2021-03-09 MED ORDER — ACETAMINOPHEN 500 MG PO TABS
1000.0000 mg | ORAL_TABLET | Freq: Four times a day (QID) | ORAL | Status: DC | PRN
  Administered 2021-03-09 – 2021-03-11 (×4): 1000 mg via ORAL
  Filled 2021-03-09 (×4): qty 2

## 2021-03-09 NOTE — Progress Notes (Signed)
Patient ID: Brent Rogers, male   DOB: August 09, 1966, 55 y.o.   MRN: [id number]   Primary Cardiologist:  Nahser   Subjective:  Denies SSCP, palpitations or Dyspnea NPO to have EGD colon ? Today   Objective:  Vitals:   03/08/21 1203 03/08/21 1655 03/08/21 1711 03/08/21 1930  BP: (!) 137/97 (!) 144/87 110/82 111/78  Pulse: 61  74 62  Resp: 14 17  17   Temp: 98 F (36.7 C)   98.7 F (37.1 C)  TempSrc: Oral   Oral  SpO2: 100%   100%  Weight:      Height:        Intake/Output from previous day:  Intake/Output Summary (Last 24 hours) at 03/09/2021 0944 Last data filed at 03/08/2021 1518 Gross per 24 hour  Intake 291.99 ml  Output 300 ml  Net -8.01 ml    Physical Exam: Affect appropriate Thin black male  HEENT: normal Neck supple with no adenopathy JVP normal no bruits no thyromegaly Lungs clear with no wheezing and good diaphragmatic motion Heart:  S1/S2 no murmur, no rub, gallop or click PMI normal Abdomen: benighn, BS positve, no tenderness, no AAA no bruit.  No HSM or HJR Distal pulses intact with no bruits No edema Neuro non-focal Skin warm and dry No muscular weakness   Lab Results: Basic Metabolic Panel: Recent Labs    03/06/21 1836 03/07/21 0139 03/09/21 0436  NA  --  139 135  K  --  3.9 3.8  CL  --  106 104  CO2  --  29 24  GLUCOSE  --  91 103*  BUN  --  <5* <5*  CREATININE  --  0.77 0.75  CALCIUM  --  8.5* 8.0*  MG 1.6*  --  1.5*  PHOS  --  3.1  --    Liver Function Tests: Recent Labs    03/06/21 1805 03/07/21 0139  AST 38 30  ALT 14 14  ALKPHOS 80 74  BILITOT 0.7 0.5  PROT 5.4* 5.1*  ALBUMIN 2.8* 2.6*   No results for input(s): LIPASE, AMYLASE in the last 72 hours. CBC: Recent Labs    03/07/21 0139 03/08/21 0103 03/09/21 0019 03/09/21 0436  WBC 5.9   < > 8.2 8.1  NEUTROABS 3.0  --   --   --   HGB 9.3*   < > 8.8* 8.9*  HCT 28.6*   < > 27.4* 28.0*  MCV 84.9   < > 83.8 84.1  PLT 281   < > 223 200   < > = values in this  interval not displayed.   Cardiac Enzymes: No results for input(s): CKTOTAL, CKMB, CKMBINDEX, TROPONINI in the last 72 hours. BNP: Invalid input(s): POCBNP D-Dimer: No results for input(s): DDIMER in the last 72 hours. Hemoglobin A1C: Recent Labs    03/06/21 2018  HGBA1C 5.2   Fasting Lipid Panel: Recent Labs    03/06/21 2032  CHOL 123  HDL 64  LDLCALC 48  TRIG 55  CHOLHDL 1.9   Thyroid Function Tests: Recent Labs    03/06/21 2017  TSH 2.960   Anemia Panel: Recent Labs    03/06/21 2033 03/07/21 0650  FERRITIN  --  11*  TIBC 328  --   IRON 194*  --     Imaging: CT CORONARY MORPH W/CTA COR W/SCORE W/CA W/CM &/OR WO/CM  Addendum Date: 03/09/2021   ADDENDUM REPORT: 03/09/2021 09:01 CLINICAL DATA:  Chest pain and Syncope EXAM: Cardiac CTA MEDICATIONS:  Sub lingual nitro. 4 mg TECHNIQUE: The patient was scanned on a [employer] 192 scanner. Gantry rotation speed was 250 msecs. Collimation was. 6 mm . A 120 kV prospective scan was triggered in the ascending thoracic aorta at 140 HU's with full mA between 30-70% of the R-R interval . Average HR during the scan was bpm. The 3D data set was interpreted on a dedicated work station using MPR, MIP and VRT modes. A total of 80 cc of contrast was used. FINDINGS: Non-cardiac: See separate report from [facility]. No significant findings on limited lung and soft tissue windows. Calcium score: Calcium noted in RCA and LAD Coronary Arteries: Right dominant with no anomalies LM: Normal LAD: 25-49% tri furcation lesion in ostial LAD distal LM and take off of large IM 1-24% calcified plaque in proximal vessel IM: 25-49% proximal calcified plaque in proximal vessel 1-24% calcified plaque in mid vessel D1: Normal Circumflex: Normal OM1: Normal AV Groove Normal RCA: 1-24% calcified plaque in proximal RCA PDA: PLA: IMPRESSION: 1. Calcium score 296 involving the RCA and LAD this is 3 th percentile for age and sex 2.  Normal  aortic root  3.7 cm 3.  CAD RADS 2 non obstructive CAD See description above [name] Electronically Signed   By: [name] M.D.   On: 03/09/2021 09:01   Result Date: 03/09/2021 EXAM: OVER-READ INTERPRETATION  CT CHEST The following report is an over-read performed by radiologist Dr. Rolm Baptise of [facility], [place] on 03/08/2021. This over-read does not include interpretation of cardiac or coronary anatomy or pathology. The coronary CTA interpretation by the cardiologist is attached. COMPARISON:  12/01/2017 FINDINGS: Vascular: Scattered aortic atherosclerosis. No aneurysm. Heart is normal size. Mediastinum/Nodes: No adenopathy Lungs/Pleura: No confluent opacities or effusions. Upper Abdomen: Imaging into the upper abdomen demonstrates no acute findings. Musculoskeletal: Chest wall soft tissues are unremarkable. No acute bony abnormality. IMPRESSION: No acute or significant extracardiac abnormality. Electronically Signed: By: Rolm Baptise M.D. On: 03/08/2021 19:15    Cardiac Studies:  ECG: SR rate 85 normal    Echo: EF 60-65%   Medications:    enoxaparin (LOVENOX) injection  40 mg Subcutaneous Q24H   ferrous sulfate  325 mg Oral Daily   lipase/protease/amylase  24,000 Units Oral TID WC   nicotine  21 mg Transdermal Daily   ondansetron  4 mg Oral Once   pantoprazole  40 mg Oral Daily   polyethylene glycol  17 g Oral BID   senna  2 tablet Oral Daily      sodium chloride 100 mL/hr at 03/09/21 0436    Assessment/Plan:   Syncope:  likely vagal due to nausea and vomiting ECG normal echo normal Bradycardia:  stable no AV block avoid AV nodal blocking drugs  CAD:  read cardiac CTA today. CAD RADS 2 no obstructive CAD tightest lesions in LAD/IM 25-49%  Ok to proceed with EGD and GI work up as needed   [employer] 03/09/2021, 9:44 AM

## 2021-03-09 NOTE — Progress Notes (Addendum)
[facility] for on-call provider regarding pt's plan's for EGD/colonoscopy. Dr. Benson Norway requested cardiology evaluation. Cardiology has cleared pt for procedure. Pt NPO without having bowel prep yesterday.  GI to see today. Will give CLD until GI evaluation.   Lyndee Hensen, DO PGY-2, [facility] 03/09/2021 9:25 AM

## 2021-03-09 NOTE — Progress Notes (Addendum)
Progress Note Covering for Drs. 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   # IDA / "flecks" of blood in stool mention during an ED visit in February.  His hgb has been declining but he tends to have a wide variation. He has been cleared from cardiac standpoint for EGD / colonoscopy.  --EGD / colonoscopy on Monday with Dr. Benson Norway.The risks and benefits of EGD with possible biopsies was discussed with the patient and they agree to proceed.  --low fat diet today, clears starting tomorrow. --I am stopping evening Lovenox after today's dose in prep for procedures on Monday   # Dyspnea / bradycardia and recent report of syncope --Cardiology has evaluated and doesn't feel syncope related to sinus bradycardia but rather dehydration from chronic pancreatitis with nausea and vomiting. ECG and echo normal. CT coronary >> no obstructive CAD. [place] for endoscopic evaluation.       # Acute on chronic abdominal pain. Chronic pancreatitis.  --CT scan w/ contrast 03/06/21 without acute findings     Attending Physician Note   I have taken an interval history, reviewed the chart and examined the patient. I agree with the Advanced Practitioner's note, impression and recommendations.   IDA and flecks of blood in stool. Schedule colonoscopy / EGD with Dr. Benson Norway on Monday. Bowel prep Sunday.  Hold Lovenox.   Syncope, bradycardia. Cleared by cardiology.   Chronic pancreatitis.   Lucio Edward, MD FACG [phone]       SUBJECTIVE:   Breathing fine.     OBJECTIVE:    Scheduled inpatient medications:  . enoxaparin (LOVENOX) injection  40 mg Subcutaneous Q24H  . ferrous sulfate  325 mg Oral Daily  . lipase/protease/amylase  24,000 Units Oral TID WC  . nicotine  21 mg Transdermal Daily  . ondansetron  4 mg  Oral Once  . pantoprazole  40 mg Oral Daily  . polyethylene glycol  17 g Oral BID  . senna  2 tablet Oral Daily   Continuous inpatient infusions:  PRN inpatient medications: acetaminophen, dicyclomine, traMADol  Vital signs in last 24 hours: Temp:  [98.7 F (37.1 C)] 98.7 F (37.1 C) (06/10 1930) Pulse Rate:  [62-74] 62 (06/10 1930) Resp:  [17] 17 (06/10 1930) BP: (110-144)/(78-87) 111/78 (06/10 1930) SpO2:  [100 %] 100 % (06/10 1930) Last BM Date: 03/05/21  Intake/Output Summary (Last 24 hours) at 03/09/2021 1343 Last data filed at 03/09/2021 1053 Gross per 24 hour  Intake 291.99 ml  Output 900 ml  Net -608.01 ml     Physical Exam:  General: Alert male in NAD Heart:  Regular rate and rhythm. No lower extremity edema Pulmonary: Normal respiratory effort Abdomen: Soft, nondistended, nontender. Normal bowel sounds.  Neurologic: Alert and oriented Psych: Pleasant. Cooperative.   Filed Weights   03/05/21 2227 03/06/21 2225  Weight: 70.3 kg 60.7 kg    Intake/Output from previous day: 06/10 0701 - 06/11 0700 In: 292 [I.V.:292] Out: 600 [Urine:600] Intake/Output this shift: Total I/O In: -  Out: 600 [Urine:600]    Lab Results: Recent Labs    03/08/21 0103 03/09/21 0019 03/09/21 0436  WBC 6.3 8.2 8.1  HGB 9.1* 8.8* 8.9*  HCT 28.7* 27.4* 28.0*  PLT 248 223 200   BMET Recent Labs  03/06/21 1805 03/07/21 0139 03/09/21 0436  NA 137 139 135  K 3.6 3.9 3.8  CL 105 106 104  CO2 22 29 24   GLUCOSE 179* 91 103*  BUN 5* <5* <5*  CREATININE 0.75 0.77 0.75  CALCIUM 8.2* 8.5* 8.0*   LFT Recent Labs    03/07/21 0139  PROT 5.1*  ALBUMIN 2.6*  AST 30  ALT 14  ALKPHOS 74  BILITOT 0.5   PT/INR No results for input(s): LABPROT, INR in the last 72 hours. Hepatitis Panel No results for input(s): HEPBSAG, HCVAB, HEPAIGM, HEPBIGM in the last 72 hours.  CT CORONARY MORPH W/CTA COR W/SCORE W/CA W/CM &/OR WO/CM  Addendum Date: 03/09/2021   ADDENDUM REPORT:  03/09/2021 09:01 CLINICAL DATA:  Chest pain and Syncope EXAM: Cardiac CTA MEDICATIONS: Sub lingual nitro. 4 mg TECHNIQUE: The patient was scanned on a [employer] 192 scanner. Gantry rotation speed was 250 msecs. Collimation was. 6 mm . A 120 kV prospective scan was triggered in the ascending thoracic aorta at 140 HU's with full mA between 30-70% of the R-R interval . Average HR during the scan was bpm. The 3D data set was interpreted on a dedicated work station using MPR, MIP and VRT modes. A total of 80 cc of contrast was used. FINDINGS: Non-cardiac: See separate report from [facility]. No significant findings on limited lung and soft tissue windows. Calcium score: Calcium noted in RCA and LAD Coronary Arteries: Right dominant with no anomalies LM: Normal LAD: 25-49% tri furcation lesion in ostial LAD distal LM and take off of large IM 1-24% calcified plaque in proximal vessel IM: 25-49% proximal calcified plaque in proximal vessel 1-24% calcified plaque in mid vessel D1: Normal Circumflex: Normal OM1: Normal AV Groove Normal RCA: 1-24% calcified plaque in proximal RCA PDA: PLA: IMPRESSION: 1. Calcium score 296 involving the RCA and LAD this is 7 th percentile for age and sex 2.  Normal  aortic root 3.7 cm 3.  CAD RADS 2 non obstructive CAD See description above [name] Electronically Signed   By: [name] M.D.   On: 03/09/2021 09:01   Result Date: 03/09/2021 EXAM: OVER-READ INTERPRETATION  CT CHEST The following report is an over-read performed by radiologist Dr. Rolm Baptise of [facility], [place] on 03/08/2021. This over-read does not include interpretation of cardiac or coronary anatomy or pathology. The coronary CTA interpretation by the cardiologist is attached. COMPARISON:  12/01/2017 FINDINGS: Vascular: Scattered aortic atherosclerosis. No aneurysm. Heart is normal size. Mediastinum/Nodes: No adenopathy Lungs/Pleura: No confluent opacities or effusions. Upper Abdomen: Imaging  into the upper abdomen demonstrates no acute findings. Musculoskeletal: Chest wall soft tissues are unremarkable. No acute bony abnormality. IMPRESSION: No acute or significant extracardiac abnormality. Electronically Signed: By: Rolm Baptise M.D. On: 03/08/2021 19:15        Active Problems:   Syncope     LOS: 1 day   Tye Savoy ,NP 03/09/2021, 1:43 PM

## 2021-03-09 NOTE — Progress Notes (Addendum)
Family Medicine Teaching Service Daily Progress Note Intern Pager: [phone]  Patient name: Brent Rogers Medical record number: [id number] Date of birth: 03/03/1966 Age: 55 y.o. Gender: male  Primary Care Provider: Marcine Matar, MD Consultants: Laurette Schimke, Cardiology Code Status: Full code  Pt Overview and Major Events to Date:  03/06/2021: Admitted to FPTS  Assessment and Plan: Brent Rogers is a 55 y.o. M presented with acute abdominal pain . PMHx significant for chronic pancreatitis, Whipple's procedure, HTN, Migraine, Scoliosis, fatty liver disease, normocytic anemia.    Acute on chronic Abdominal pain I Hx of chronic pancreatitis s/p Whipple's (2015) Patient c/o of 9/10 abdominal pain, releievd with Tylenol. On re-evaluation in AM, only c/o mild discomfort and is in  no need for pain meds. -- GI following, appreciate their assistance -- NPO after midnight for possible Colonoscopy/ Endoscopy -- mIVF @100  ml/hr x 8 hours --Tylenol 1000 mg PRN  -- Bentyl 20 mg q4h PRN for spasms -- Creon TID -- Tramadol 50 mg q12 hr PRN  Sinus Bradycardia HR in 40's to 70's, much improved from yesterday. K+ 3.8, Mag 1.5.Cardiology consulted yesterday and they do not think sinus bradycardia and syncope are inter-related but due to frequent degydration from chronic pancreatitis with nausea and vomiting. No indication for pacemaker as per cards. -- cardiology following, appreciate recs -- BMP and Mag in am -- IV 2 g Magsulphate  Dyspnea on exertion I Hx of intermittent chest pain No chest pain today. Patient has h/o of dyspnea on exertion and decreased exercise tolerance along with intermittent chest pain. CT coronary angiography does not show any acute or significant extracardiac abnormality. --f/u cardiology recs   Anemia Hgb stable 8.8 this AM.  No signs of active bleeding. --CBC  in am --c/w Ferrous sulphate 325 mg  FEN/GI: NPO PPx: Lovenox    Status is: Inpatient  Remains  inpatient appropriate because:Ongoing active pain requiring inpatient pain management  Dispo: The patient is from: Home              Anticipated d/c is to: Home              Patient currently is not medically stable to d/c.   Difficult to place patient No        Subjective:  Patient received Tylenol for abdominal pain. Patient evaluated at bedside this AM. States his abdominal pain is getting better and he is able to tolerate it well now. Denies any new complaints and seems in good spirits this AM.  Received 1 dose of IV Fentanyl for abdominal pain.   Objective: Temp:  [97.7 F (36.5 C)-98.7 F (37.1 C)] 98.7 F (37.1 C) (06/10 1930) Pulse Rate:  [48-74] 62 (06/10 1930) Resp:  [14-17] 17 (06/10 1930) BP: (110-152)/(78-98) 111/78 (06/10 1930) SpO2:  [100 %] 100 % (06/10 1930) Physical Exam: General: Thin appearing male lying comfortably in bed, NAD Cardiovascular: Regular rate and rhythm Respiratory: Clear to ausculation bilaterally Abdomen: Abdominal tenderness + Extremities: No edema appreciated.  Laboratory: Recent Labs  Lab 03/08/21 0103 03/09/21 0019 03/09/21 0436  WBC 6.3 8.2 8.1  HGB 9.1* 8.8* 8.9*  HCT 28.7* 27.4* 28.0*  PLT 248 223 200   Recent Labs  Lab 03/05/21 2227 03/06/21 1805 03/07/21 0139 03/09/21 0436  NA 138 137 139 135  K 3.8 3.6 3.9 3.8  CL 106 105 106 104  CO2 22 22 29 24   BUN 5* 5* <5* <5*  CREATININE 0.72 0.75 0.77 0.75  CALCIUM 8.4*  8.2* 8.5* 8.0*  PROT 6.0* 5.4* 5.1*  --   BILITOT 0.4 0.7 0.5  --   ALKPHOS 85 80 74  --   ALT 15 14 14   --   AST 40 38 30  --   GLUCOSE 105* 179* 91 103*     Imaging/Diagnostic Tests: CT CORONARY MORPH W/CTA COR W/SCORE W/CA W/CM &/OR WO/CM  Result Date: 03/08/2021 EXAM: OVER-READ INTERPRETATION  CT CHEST The following report is an over-read performed by radiologist Dr. Charlett Nose of [facility] on 03/08/2021. This over-read does not include interpretation of cardiac or coronary  anatomy or pathology. The coronary CTA interpretation by the cardiologist is attached. COMPARISON:  12/01/2017 FINDINGS: Vascular: Scattered aortic atherosclerosis. No aneurysm. Heart is normal size. Mediastinum/Nodes: No adenopathy Lungs/Pleura: No confluent opacities or effusions. Upper Abdomen: Imaging into the upper abdomen demonstrates no acute findings. Musculoskeletal: Chest wall soft tissues are unremarkable. No acute bony abnormality. IMPRESSION: No acute or significant extracardiac abnormality. Electronically Signed   By: Charlett Nose M.D.   On: 03/08/2021 19:15     Aydin Hink, Geralynn Rile, MD 03/09/2021, 6:06 AM PGY-1, [place] [facility] FPTS Intern pager: [phone], text pages welcome

## 2021-03-09 NOTE — Progress Notes (Signed)
Pt is on [place] diet until 0500. At 0500 pt's diet needs to be changed to clears via GI and at 1800 tomorrow pt needs to start prep for colonoscopy on Monday.

## 2021-03-10 DIAGNOSIS — R001 Bradycardia, unspecified: Secondary | ICD-10-CM

## 2021-03-10 DIAGNOSIS — I2510 Atherosclerotic heart disease of native coronary artery without angina pectoris: Secondary | ICD-10-CM

## 2021-03-10 DIAGNOSIS — K921 Melena: Secondary | ICD-10-CM

## 2021-03-10 LAB — BASIC METABOLIC PANEL
Anion gap: 3 — ABNORMAL LOW (ref 5–15)
BUN: <5 mg/dL — ABNORMAL LOW (ref 6–20)
CO2: 27 mmol/L (ref 22–32)
Calcium: 8.2 mg/dL — ABNORMAL LOW (ref 8.9–10.3)
Chloride: 104 mmol/L (ref 98–111)
Creatinine, Ser: 0.87 mg/dL (ref 0.61–1.24)
GFR, Estimated: >60 mL/min (ref >60)
Glucose, Bld: 99 mg/dL (ref 70–99)
Potassium: 4 mmol/L (ref 3.5–5.1)
Sodium: 134 mmol/L — ABNORMAL LOW (ref 135–145)

## 2021-03-10 LAB — CBC
HCT: 27.8 % — ABNORMAL LOW (ref 39.0–52.0)
Hemoglobin: 9 g/dL — ABNORMAL LOW (ref 13.0–17.0)
MCH: 27.1 pg (ref 26.0–34.0)
MCHC: 32.4 g/dL (ref 30.0–36.0)
MCV: 83.7 fL (ref 80.0–100.0)
Platelets: 207 10*3/uL (ref 150–400)
RBC: 3.32 MIL/uL — ABNORMAL LOW (ref 4.22–5.81)
RDW: 20.3 % — ABNORMAL HIGH (ref 11.5–15.5)
WBC: 7 10*3/uL (ref 4.0–10.5)
nRBC: 0 % (ref 0.0–0.2)

## 2021-03-10 LAB — MAGNESIUM: Magnesium: 1.6 mg/dL — ABNORMAL LOW (ref 1.7–2.4)

## 2021-03-10 MED ORDER — PEG-KCL-NACL-NASULF-NA ASC-C 100 G PO SOLR: 1.0000 | Freq: Once | ORAL | Status: DC

## 2021-03-10 MED ORDER — PEG-KCL-NACL-NASULF-NA ASC-C 100 G PO SOLR
0.5000 | Freq: Once | ORAL | Status: AC
  Administered 2021-03-10: 100 g via ORAL
  Filled 2021-03-10 (×2): qty 1

## 2021-03-10 MED ORDER — MAGNESIUM SULFATE 2 GM/50ML IV SOLN
2.0000 g | Freq: Once | INTRAVENOUS | Status: AC
Start: 2021-03-10 — End: 2021-03-10
  Administered 2021-03-10: 2 g via INTRAVENOUS
  Filled 2021-03-10: qty 50

## 2021-03-10 MED ORDER — PEG-KCL-NACL-NASULF-NA ASC-C 100 G PO SOLR
0.5000 | Freq: Once | ORAL | Status: AC
  Administered 2021-03-10: 100 g via ORAL

## 2021-03-10 NOTE — Anesthesia Preprocedure Evaluation (Addendum)
Anesthesia Evaluation  Patient identified by MRN, date of birth, ID band Patient awake    Reviewed: Allergy & Precautions, NPO status , Patient's Chart, lab work & pertinent test results  Airway Mallampati: II  TM Distance: >3 FB Neck ROM: Full    Dental no notable dental hx. (+) Teeth Intact, Dental Advisory Given   Pulmonary Current Smoker and Patient abstained from smoking.,    Pulmonary exam normal breath sounds clear to auscultation       Cardiovascular hypertension, + CAD  Normal cardiovascular exam Rhythm:Regular Rate:Normal     Neuro/Psych  Headaches, PSYCHIATRIC DISORDERS Depression    GI/Hepatic Neg liver ROS, GERD  ,  Endo/Other  negative endocrine ROS  Renal/GU negative Renal ROS     Musculoskeletal negative musculoskeletal ROS (+)   Abdominal   Peds  Hematology  (+) anemia , hgb 9.0   Anesthesia Other Findings   Reproductive/Obstetrics                            Anesthesia Physical Anesthesia Plan  ASA: 4  Anesthesia Plan: MAC   Post-op Pain Management:    Induction: Intravenous  PONV Risk Score and Plan: Treatment may vary due to age or medical condition  Airway Management Planned: Natural Airway and Nasal Cannula  Additional Equipment:   Intra-op Plan:   Post-operative Plan:   Informed Consent: I have reviewed the patients History and Physical, chart, labs and discussed the procedure including the risks, benefits and alternatives for the proposed anesthesia with the patient or authorized representative who has indicated his/her understanding and acceptance.     Dental advisory given  Plan Discussed with:   Anesthesia Plan Comments: (EGD colon for syncope ang GI bleed)       Anesthesia Quick Evaluation

## 2021-03-10 NOTE — Progress Notes (Signed)
Family Medicine Teaching Service Daily Progress Note Intern Pager: [phone]  Patient name: Brent Rogers Medical record number: [id number] Date of birth: 11/17/1965 Age: 55 y.o. Gender: male  Primary Care Provider: Ladell Pier, MD Consultants: GI, Cards Code Status: Full  Pt Overview and Major Events to Date:  03/06/2021: Admitted to FPTS  Assessment and Plan: Brent Rogers is a 55 y.o. M presented with acute abdominal pain . PMHx significant for chronic pancreatitis, Whipple's procedure, HTN, Migraine, Scoliosis, fatty liver disease, normocytic anemia.    Acute on Chronic Chest/Abdominal Pain  Cleared by Cards for EGD/Colonoscopy.  CTA Cornoary showed no obstruction of Coronary arteries.  Plan for clear out and  - Clear Liquid Diet - Hold Miralax/Senna - Plan for EGD Colonoscopy tomorrow - Hold Lovenox  Anemia Hgb stable at 9.0.  No active bleed - Daily CBC    Hypomagnesemia Low again today. - Replete IV 2g  FEN/GI: Clear Liquid PPx: SCD's   Status is: Inpatient  Remains inpatient appropriate because:Ongoing diagnostic testing needed not appropriate for outpatient work up  Dispo: The patient is from: Home              Anticipated d/c is to: Home              Patient currently is not medically stable to d/c.   Difficult to place patient No    Subjective:  Patient still having mild abdominal pain.  Has had multiple bowel movements this morning.  Objective: Temp:  [97.7 F (36.5 C)-98 F (36.7 C)] 98 F (36.7 C) (06/12 0530) Pulse Rate:  [57-60] 60 (06/12 0530) Resp:  [12-16] 13 (06/12 0530) BP: (115-152)/(82-105) 145/92 (06/12 0530) SpO2:  [100 %] 100 % (06/12 0530) Physical Exam:  Physical Exam Constitutional:      General: He is not in acute distress.    Appearance: He is not ill-appearing.  HENT:     Head: Normocephalic and atraumatic.     Mouth/Throat:     Mouth: Mucous membranes are moist.  Pulmonary:     Effort: Pulmonary effort is  normal.     Breath sounds: Normal breath sounds.  Abdominal:     General: Bowel sounds are normal.     Palpations: Abdomen is soft.     Tenderness: There is abdominal tenderness in the left upper quadrant and left lower quadrant.  Skin:    General: Skin is warm.  Neurological:     Mental Status: He is alert.  Psychiatric:        Mood and Affect: Mood normal.        Behavior: Behavior normal.     Laboratory: Recent Labs  Lab 03/09/21 0019 03/09/21 0436 03/10/21 0146  WBC 8.2 8.1 7.0  HGB 8.8* 8.9* 9.0*  HCT 27.4* 28.0* 27.8*  PLT 223 200 207   Recent Labs  Lab 03/05/21 2227 03/06/21 1805 03/07/21 0139 03/09/21 0436 03/10/21 0146  NA 138 137 139 135 134*  K 3.8 3.6 3.9 3.8 4.0  CL 106 105 106 104 104  CO2 22 22 29 24 27   BUN 5* 5* <5* <5* <5*  CREATININE 0.72 0.75 0.77 0.75 0.87  CALCIUM 8.4* 8.2* 8.5* 8.0* 8.2*  PROT 6.0* 5.4* 5.1*  --   --   BILITOT 0.4 0.7 0.5  --   --   ALKPHOS 85 80 74  --   --   ALT 15 14 14   --   --   AST 40 38 30  --   --  GLUCOSE 105* 179* 91 103* 99    Mg- 1.6  Imaging/Diagnostic Tests:  EXAM: Cardiac CTA   MEDICATIONS: Sub lingual nitro. 4 mg   TECHNIQUE: The patient was scanned on a [employer] 192 scanner. Gantry rotation speed was 250 msecs. Collimation was. 6 mm . A 120 kV prospective scan was triggered in the ascending thoracic aorta at 140 HU's with full mA between 30-70% of the R-R interval . Average HR during the scan was bpm. The 3D data set was interpreted on a dedicated work station using MPR, MIP and VRT modes. A total of 80 cc of contrast was used.   FINDINGS: Non-cardiac: See separate report from [facility]. No significant findings on limited lung and soft tissue windows.   Calcium score: Calcium noted in RCA and LAD   Coronary Arteries: Right dominant with no anomalies   LM: Normal   LAD: 25-49% tri furcation lesion in ostial LAD distal LM and take off of large IM 1-24% calcified plaque  in proximal vessel   IM: 25-49% proximal calcified plaque in proximal vessel 1-24% calcified plaque in mid vessel   D1: Normal   Circumflex: Normal   OM1: Normal   AV Groove Normal   RCA: 1-24% calcified plaque in proximal RCA   PDA:   PLA:   IMPRESSION: 1. Calcium score 296 involving the RCA and LAD this is 64 th percentile for age and sex   2.  Normal  aortic root 3.7 cm   3.  CAD RADS 2 non obstructive CAD See description above   Kerrie Pleasure, MD 03/10/2021, 7:07 AM PGY-1, [place] Intern pager: [phone], text pages welcome

## 2021-03-10 NOTE — Progress Notes (Addendum)
Patient ID: Brent Rogers, male   DOB: 1966-06-04, 55 y.o.   MRN: [id number]   Progress Note Covering for Drs. Mann & Hung   Subjective   Day # 4  CC: iron deficiency anemia, blood in stool  Hemoglobin 9 today stable  Lovenox on hold  No specific complaints today, patient   Objective   Vital signs in last 24 hours: Temp:  [97.7 F (36.5 C)-98 F (36.7 C)] 98 F (36.7 C) (06/12 0530) Pulse Rate:  [57-60] 60 (06/12 0530) Resp:  [12-16] 13 (06/12 0530) BP: (115-152)/(82-105) 145/92 (06/12 0530) SpO2:  [100 %] 100 % (06/12 0530) Last BM Date: 03/08/21 General:   African-American male in NAD Heart:  Regular rate and rhythm; no murmurs Lungs: Respirations even and unlabored, lungs CTA bilaterally Abdomen:  Soft, nontender and nondistended. Normal bowel sounds. Extremities:  Without edema. Neurologic:  Alert and oriented,  grossly normal neurologically. Psych:  Cooperative. Normal mood and affect.  Intake/Output from previous day: 06/11 0701 - 06/12 0700 In: 390 [P.O.:390] Out: 2825 [Urine:2825] Intake/Output this shift: No intake/output data recorded.  Lab Results: Recent Labs    03/09/21 0019 03/09/21 0436 03/10/21 0146  WBC 8.2 8.1 7.0  HGB 8.8* 8.9* 9.0*  HCT 27.4* 28.0* 27.8*  PLT 223 200 207   BMET Recent Labs    03/09/21 0436 03/10/21 0146  NA 135 134*  K 3.8 4.0  CL 104 104  CO2 24 27  GLUCOSE 103* 99  BUN <5* <5*  CREATININE 0.75 0.87  CALCIUM 8.0* 8.2*    Studies/Results: CT CORONARY MORPH W/CTA COR W/SCORE W/CA W/CM &/OR WO/CM  Addendum Date: 03/09/2021   ADDENDUM REPORT: 03/09/2021 09:01 CLINICAL DATA:  Chest pain and Syncope EXAM: Cardiac CTA MEDICATIONS: Sub lingual nitro. 4 mg TECHNIQUE: The patient was scanned on a [employer] 192 scanner. Gantry rotation speed was 250 msecs. Collimation was. 6 mm . A 120 kV prospective scan was triggered in the ascending thoracic aorta at 140 HU's with full mA between 30-70% of the R-R interval .  Average HR during the scan was bpm. The 3D data set was interpreted on a dedicated work station using MPR, MIP and VRT modes. A total of 80 cc of contrast was used. FINDINGS: Non-cardiac: See separate report from [facility]. No significant findings on limited lung and soft tissue windows. Calcium score: Calcium noted in RCA and LAD Coronary Arteries: Right dominant with no anomalies LM: Normal LAD: 25-49% tri furcation lesion in ostial LAD distal LM and take off of large IM 1-24% calcified plaque in proximal vessel IM: 25-49% proximal calcified plaque in proximal vessel 1-24% calcified plaque in mid vessel D1: Normal Circumflex: Normal OM1: Normal AV Groove Normal RCA: 1-24% calcified plaque in proximal RCA PDA: PLA: IMPRESSION: 1. Calcium score 296 involving the RCA and LAD this is 83 th percentile for age and sex 2.  Normal  aortic root 3.7 cm 3.  CAD RADS 2 non obstructive CAD See description above [name] Electronically Signed   By: [name] M.D.   On: 03/09/2021 09:01   Result Date: 03/09/2021 EXAM: OVER-READ INTERPRETATION  CT CHEST The following report is an over-read performed by radiologist Dr. Rolm Baptise of [facility], [place] on 03/08/2021. This over-read does not include interpretation of cardiac or coronary anatomy or pathology. The coronary CTA interpretation by the cardiologist is attached. COMPARISON:  12/01/2017 FINDINGS: Vascular: Scattered aortic atherosclerosis. No aneurysm. Heart is normal size. Mediastinum/Nodes: No adenopathy Lungs/Pleura: No  confluent opacities or effusions. Upper Abdomen: Imaging into the upper abdomen demonstrates no acute findings. Musculoskeletal: Chest wall soft tissues are unremarkable. No acute bony abnormality. IMPRESSION: No acute or significant extracardiac abnormality. Electronically Signed: By: Rolm Baptise M.D. On: 03/08/2021 19:15       Assessment / Plan:    #30 56 year old male with history of previous Whipple 2015 for pancreatic  head mass, ultimately diagnosed as chronic pancreatitis.  #2 iron deficiency anemia, and intermittent blood in stool with drifting hemoglobin Hemoglobin stable  #3 dyspnea, recent bradycardia and report of syncope-cardiology has evaluated and cleared for sedation.  Coronary CT no obstructive coronary artery disease  #4 acute on chronic abdominal pain secondary to chronic pancreatitis  Plan:  Patient will be scheduled for colonoscopy and EGD with Dr. Benson Norway tomorrow 03/11/2021 Clear liquids today, n.p.o. after midnight Prep this evening Lovenox is on hold    LOS: 2 days   Amy Esterwood  03/10/2021, 9:50 AM     Attending Physician Note   I have taken an interval history, reviewed the chart and examined the patient. I agree with the Advanced Practitioner's note, impression and recommendations.   *IDA with ferritin=11, intermittent blood in stool. Colonoscopy and EGD with Dr. Benson Norway tomorrow. Lovenox on hold. Bowel prep today.   *Chronic pancreatitis with acute on chronic abdominal pain  *S/P Whipple   Dr. Benson Norway to resume GI care on Monday.  Lucio Edward, MD FACG [phone]

## 2021-03-10 NOTE — H&P (View-Only) (Signed)
Patient ID: Brent Rogers, male   DOB: Aug 23, 1966, 55 y.o.   MRN: [id number]   Progress Note Covering for Drs. Mann & Hung   Subjective   Day # 4  CC: iron deficiency anemia, blood in stool  Hemoglobin 9 today stable  Lovenox on hold  No specific complaints today, patient   Objective   Vital signs in last 24 hours: Temp:  [97.7 F (36.5 C)-98 F (36.7 C)] 98 F (36.7 C) (06/12 0530) Pulse Rate:  [57-60] 60 (06/12 0530) Resp:  [12-16] 13 (06/12 0530) BP: (115-152)/(82-105) 145/92 (06/12 0530) SpO2:  [100 %] 100 % (06/12 0530) Last BM Date: 03/08/21 General:   African-American male in NAD Heart:  Regular rate and rhythm; no murmurs Lungs: Respirations even and unlabored, lungs CTA bilaterally Abdomen:  Soft, nontender and nondistended. Normal bowel sounds. Extremities:  Without edema. Neurologic:  Alert and oriented,  grossly normal neurologically. Psych:  Cooperative. Normal mood and affect.  Intake/Output from previous day: 06/11 0701 - 06/12 0700 In: 390 [P.O.:390] Out: 2825 [Urine:2825] Intake/Output this shift: No intake/output data recorded.  Lab Results: Recent Labs    03/09/21 0019 03/09/21 0436 03/10/21 0146  WBC 8.2 8.1 7.0  HGB 8.8* 8.9* 9.0*  HCT 27.4* 28.0* 27.8*  PLT 223 200 207   BMET Recent Labs    03/09/21 0436 03/10/21 0146  NA 135 134*  K 3.8 4.0  CL 104 104  CO2 24 27  GLUCOSE 103* 99  BUN <5* <5*  CREATININE 0.75 0.87  CALCIUM 8.0* 8.2*    Studies/Results: CT CORONARY MORPH W/CTA COR W/SCORE W/CA W/CM &/OR WO/CM  Addendum Date: 03/09/2021   ADDENDUM REPORT: 03/09/2021 09:01 CLINICAL DATA:  Chest pain and Syncope EXAM: Cardiac CTA MEDICATIONS: Sub lingual nitro. 4 mg TECHNIQUE: The patient was scanned on a [employer] 192 scanner. Gantry rotation speed was 250 msecs. Collimation was. 6 mm . A 120 kV prospective scan was triggered in the ascending thoracic aorta at 140 HU's with full mA between 30-70% of the R-R interval .  Average HR during the scan was bpm. The 3D data set was interpreted on a dedicated work station using MPR, MIP and VRT modes. A total of 80 cc of contrast was used. FINDINGS: Non-cardiac: See separate report from [facility]. No significant findings on limited lung and soft tissue windows. Calcium score: Calcium noted in RCA and LAD Coronary Arteries: Right dominant with no anomalies LM: Normal LAD: 25-49% tri furcation lesion in ostial LAD distal LM and take off of large IM 1-24% calcified plaque in proximal vessel IM: 25-49% proximal calcified plaque in proximal vessel 1-24% calcified plaque in mid vessel D1: Normal Circumflex: Normal OM1: Normal AV Groove Normal RCA: 1-24% calcified plaque in proximal RCA PDA: PLA: IMPRESSION: 1. Calcium score 296 involving the RCA and LAD this is 83 th percentile for age and sex 2.  Normal  aortic root 3.7 cm 3.  CAD RADS 2 non obstructive CAD See description above [name] Electronically Signed   By: [name] M.D.   On: 03/09/2021 09:01   Result Date: 03/09/2021 EXAM: OVER-READ INTERPRETATION  CT CHEST The following report is an over-read performed by radiologist Dr. Rolm Baptise of [facility], [place] on 03/08/2021. This over-read does not include interpretation of cardiac or coronary anatomy or pathology. The coronary CTA interpretation by the cardiologist is attached. COMPARISON:  12/01/2017 FINDINGS: Vascular: Scattered aortic atherosclerosis. No aneurysm. Heart is normal size. Mediastinum/Nodes: No adenopathy Lungs/Pleura: No  confluent opacities or effusions. Upper Abdomen: Imaging into the upper abdomen demonstrates no acute findings. Musculoskeletal: Chest wall soft tissues are unremarkable. No acute bony abnormality. IMPRESSION: No acute or significant extracardiac abnormality. Electronically Signed: By: Rolm Baptise M.D. 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  #2 iron deficiency anemia, and intermittent blood in stool with drifting hemoglobin Hemoglobin stable  #3 dyspnea, recent bradycardia and report of syncope-cardiology has evaluated and cleared for sedation.  Coronary CT no obstructive coronary artery disease  #4 acute on chronic abdominal pain secondary to chronic pancreatitis  Plan:  Patient will be scheduled for colonoscopy and EGD with Dr. Benson Norway tomorrow 03/11/2021 Clear liquids today, n.p.o. after midnight Prep this evening Lovenox is on hold    LOS: 2 days   Amy Esterwood  03/10/2021, 9:50 AM     Attending Physician Note   I have taken an interval history, reviewed the chart and examined the patient. I agree with the Advanced Practitioner's note, impression and recommendations.   *IDA with ferritin=11, intermittent blood in stool. Colonoscopy and EGD with Dr. Benson Norway tomorrow. Lovenox on hold. Bowel prep today.   *Chronic pancreatitis with acute on chronic abdominal pain  *S/P Whipple   Dr. Benson Norway to resume GI care on Monday.  Lucio Edward, MD FACG [phone]

## 2021-03-10 NOTE — Progress Notes (Signed)
Patient ID: Brent Rogers, male   DOB: Aug 18, 1966, 55 y.o.   MRN: [id number]   Primary Cardiologist:  Nahser   Subjective:  No chest pain   Objective:  Vitals:   03/08/21 1930 03/09/21 1500 03/09/21 2120 03/10/21 0530  BP: 111/78 (!) 152/105 115/82 (!) 145/92  Pulse: 62 (!) 57 (!) 58 60  Resp: $Remo'17 16 12 13  'vMRNr$ Temp: 98.7 F (37.1 C) 97.7 F (36.5 C) 97.9 F (36.6 C) 98 F (36.7 C)  TempSrc: Oral Oral Oral Oral  SpO2: 100%  100% 100%  Weight:      Height:        Intake/Output from previous day:  Intake/Output Summary (Last 24 hours) at 03/10/2021 1050 Last data filed at 03/10/2021 0600 Gross per 24 hour  Intake 390 ml  Output 2825 ml  Net -2435 ml    Physical Exam: Affect appropriate Thin black male  HEENT: normal Neck supple with no adenopathy JVP normal no bruits no thyromegaly Lungs clear with no wheezing and good diaphragmatic motion Heart:  S1/S2 no murmur, no rub, gallop or click PMI normal Abdomen: benighn, BS positve, no tenderness, no AAA no bruit.  No HSM or HJR Distal pulses intact with no bruits No edema Neuro non-focal Skin warm and dry No muscular weakness   Lab Results: Basic Metabolic Panel: Recent Labs    03/09/21 0436 03/10/21 0146  NA 135 134*  K 3.8 4.0  CL 104 104  CO2 24 27  GLUCOSE 103* 99  BUN <5* <5*  CREATININE 0.75 0.87  CALCIUM 8.0* 8.2*  MG 1.5* 1.6*   Liver Function Tests: No results for input(s): AST, ALT, ALKPHOS, BILITOT, PROT, ALBUMIN in the last 72 hours.  No results for input(s): LIPASE, AMYLASE in the last 72 hours. CBC: Recent Labs    03/09/21 0436 03/10/21 0146  WBC 8.1 7.0  HGB 8.9* 9.0*  HCT 28.0* 27.8*  MCV 84.1 83.7  PLT 200 207   Cardiac Enzymes: No results for input(s): CKTOTAL, CKMB, CKMBINDEX, TROPONINI in the last 72 hours. BNP: Invalid input(s): POCBNP D-Dimer: No results for input(s): DDIMER in the last 72 hours. Hemoglobin A1C: No results for input(s): HGBA1C in the last 72  hours.  Fasting Lipid Panel: No results for input(s): CHOL, HDL, LDLCALC, TRIG, CHOLHDL, LDLDIRECT in the last 72 hours.  Thyroid Function Tests: No results for input(s): TSH, T4TOTAL, T3FREE, THYROIDAB in the last 72 hours.  Invalid input(s): FREET3  Anemia Panel: No results for input(s): VITAMINB12, FOLATE, FERRITIN, TIBC, IRON, RETICCTPCT in the last 72 hours.   Imaging: CT CORONARY MORPH W/CTA COR W/SCORE W/CA W/CM &/OR WO/CM  Addendum Date: 03/09/2021   ADDENDUM REPORT: 03/09/2021 09:01 CLINICAL DATA:  Chest pain and Syncope EXAM: Cardiac CTA MEDICATIONS: Sub lingual nitro. 4 mg TECHNIQUE: The patient was scanned on a [employer] 192 scanner. Gantry rotation speed was 250 msecs. Collimation was. 6 mm . A 120 kV prospective scan was triggered in the ascending thoracic aorta at 140 HU's with full mA between 30-70% of the R-R interval . Average HR during the scan was bpm. The 3D data set was interpreted on a dedicated work station using MPR, MIP and VRT modes. A total of 80 cc of contrast was used. FINDINGS: Non-cardiac: See separate report from [facility]. No significant findings on limited lung and soft tissue windows. Calcium score: Calcium noted in RCA and LAD Coronary Arteries: Right dominant with no anomalies LM: Normal LAD: 25-49% tri furcation lesion in  ostial LAD distal LM and take off of large IM 1-24% calcified plaque in proximal vessel IM: 25-49% proximal calcified plaque in proximal vessel 1-24% calcified plaque in mid vessel D1: Normal Circumflex: Normal OM1: Normal AV Groove Normal RCA: 1-24% calcified plaque in proximal RCA PDA: PLA: IMPRESSION: 1. Calcium score 296 involving the RCA and LAD this is 78 th percentile for age and sex 2.  Normal  aortic root 3.7 cm 3.  CAD RADS 2 non obstructive CAD See description above [name] Electronically Signed   By: [name] M.D.   On: 03/09/2021 09:01   Result Date: 03/09/2021 EXAM: OVER-READ INTERPRETATION  CT CHEST  The following report is an over-read performed by radiologist Dr. Rolm Baptise of [facility], [place] on 03/08/2021. This over-read does not include interpretation of cardiac or coronary anatomy or pathology. The coronary CTA interpretation by the cardiologist is attached. COMPARISON:  12/01/2017 FINDINGS: Vascular: Scattered aortic atherosclerosis. No aneurysm. Heart is normal size. Mediastinum/Nodes: No adenopathy Lungs/Pleura: No confluent opacities or effusions. Upper Abdomen: Imaging into the upper abdomen demonstrates no acute findings. Musculoskeletal: Chest wall soft tissues are unremarkable. No acute bony abnormality. IMPRESSION: No acute or significant extracardiac abnormality. Electronically Signed: By: Rolm Baptise M.D. On: 03/08/2021 19:15    Cardiac Studies:  ECG: SR rate 85 normal    Echo: EF 60-65%   Medications:    ferrous sulfate  325 mg Oral Daily   lipase/protease/amylase  24,000 Units Oral TID WC   nicotine  21 mg Transdermal Daily   ondansetron  4 mg Oral Once   pantoprazole  40 mg Oral Daily   peg 3350 powder  0.5 kit Oral Once   And   peg 3350 powder  0.5 kit Oral Once        Assessment/Plan:   Syncope:  likely vagal due to nausea and vomiting ECG normal echo normal Bradycardia:  stable no AV block avoid AV nodal blocking drugs  CAD:  read cardiac CTA yesterday  CAD RADS 2 no obstructive CAD tightest lesions in LAD/IM 25-49%  Ok to proceed with EGD and GI work up as needed  4. Anemia/GI bleed:  history of Whipple and chronic pancreatitis on iron  replacement for EGD and colon ? In am   Cardiology will sign off   [name] 03/10/2021, 10:50 AM   Patient ID: Brent Rogers, male   DOB: Apr 11, 1966, 55 y.o.   MRN: [id number]

## 2021-03-11 ENCOUNTER — Other Ambulatory Visit: Payer: Self-pay

## 2021-03-11 ENCOUNTER — Inpatient Hospital Stay (HOSPITAL_COMMUNITY): Payer: Medicaid Other | Admitting: Anesthesiology

## 2021-03-11 ENCOUNTER — Inpatient Hospital Stay (HOSPITAL_COMMUNITY): Payer: Medicaid Other

## 2021-03-11 ENCOUNTER — Encounter (HOSPITAL_COMMUNITY)
Admission: EM | Disposition: A | Discharge status: Home / Self Care | Payer: Self-pay | Source: Home / Self Care | Attending: Family Medicine

## 2021-03-11 DIAGNOSIS — Z9889 Other specified postprocedural states: Secondary | ICD-10-CM

## 2021-03-11 DIAGNOSIS — S1120XA Unspecified open wound of pharynx and cervical esophagus, initial encounter: Secondary | ICD-10-CM

## 2021-03-11 DIAGNOSIS — R109 Unspecified abdominal pain: Secondary | ICD-10-CM | POA: Diagnosis not present

## 2021-03-11 HISTORY — PX: HEMOSTASIS CLIP PLACEMENT: SHX6857

## 2021-03-11 HISTORY — PX: SAVORY DILATION: SHX5439

## 2021-03-11 HISTORY — PX: COLONOSCOPY WITH PROPOFOL: SHX5780

## 2021-03-11 HISTORY — PX: ESOPHAGOGASTRODUODENOSCOPY (EGD) WITH PROPOFOL: SHX5813

## 2021-03-11 HISTORY — PX: HEMOSTASIS CONTROL: SHX6838

## 2021-03-11 LAB — MAGNESIUM: Magnesium: 1.6 mg/dL — ABNORMAL LOW (ref 1.7–2.4)

## 2021-03-11 LAB — GLUCOSE, CAPILLARY
Glucose-Capillary: 90 mg/dL (ref 70–99)
Glucose-Capillary: 74 mg/dL (ref 70–99)

## 2021-03-11 LAB — CBC
HCT: 37.6 % — ABNORMAL LOW (ref 39.0–52.0)
Hemoglobin: 11.8 g/dL — ABNORMAL LOW (ref 13.0–17.0)
MCH: 26.5 pg (ref 26.0–34.0)
MCHC: 31.4 g/dL (ref 30.0–36.0)
MCV: 84.3 fL (ref 80.0–100.0)
Platelets: 190 10*3/uL (ref 150–400)
RBC: 4.46 MIL/uL (ref 4.22–5.81)
RDW: 20.6 % — ABNORMAL HIGH (ref 11.5–15.5)
WBC: 4.6 10*3/uL (ref 4.0–10.5)
nRBC: 0 % (ref 0.0–0.2)

## 2021-03-11 SURGERY — COLONOSCOPY WITH PROPOFOL
Anesthesia: Monitor Anesthesia Care

## 2021-03-11 MED ORDER — PROPOFOL 500 MG/50ML IV EMUL
INTRAVENOUS | Status: DC | PRN
  Administered 2021-03-11: 100 ug/kg/min via INTRAVENOUS

## 2021-03-11 MED ORDER — PROPOFOL 10 MG/ML IV BOLUS
INTRAVENOUS | Status: DC | PRN
  Administered 2021-03-11: 30 mg via INTRAVENOUS

## 2021-03-11 MED ORDER — PHENOL 1.4 % MT LIQD
1.0000 | OROMUCOSAL | Status: DC | PRN
  Filled 2021-03-11: qty 177

## 2021-03-11 MED ORDER — LACTATED RINGERS IV SOLN
INTRAVENOUS | Status: AC | PRN
  Administered 2021-03-11: 1000 mL via INTRAVENOUS

## 2021-03-11 MED ORDER — FENTANYL CITRATE (PF) 100 MCG/2ML IJ SOLN
12.5000 ug | Freq: Once | INTRAMUSCULAR | Status: AC
  Administered 2021-03-11: 12.5 ug via INTRAVENOUS
  Filled 2021-03-11: qty 2

## 2021-03-11 MED ORDER — LIDOCAINE 2% (20 MG/ML) 5 ML SYRINGE
INTRAMUSCULAR | Status: DC | PRN
  Administered 2021-03-11: 100 mg via INTRAVENOUS

## 2021-03-11 MED ORDER — MAGNESIUM SULFATE 2 GM/50ML IV SOLN
2.0000 g | Freq: Once | INTRAVENOUS | Status: AC
  Administered 2021-03-11: 2 g via INTRAVENOUS
  Filled 2021-03-11: qty 50

## 2021-03-11 MED ORDER — FENTANYL CITRATE (PF) 100 MCG/2ML IJ SOLN
50.0000 ug | Freq: Once | INTRAMUSCULAR | Status: AC
  Administered 2021-03-11: 50 ug via INTRAVENOUS
  Filled 2021-03-11: qty 2

## 2021-03-11 MED ORDER — CLINDAMYCIN PHOSPHATE 600 MG/50ML IV SOLN
600.0000 mg | Freq: Three times a day (TID) | INTRAVENOUS | Status: DC
  Administered 2021-03-11 – 2021-03-12 (×4): 600 mg via INTRAVENOUS
  Filled 2021-03-11 (×5): qty 50

## 2021-03-11 MED ORDER — LACTATED RINGERS IV SOLN: INTRAVENOUS | Status: DC

## 2021-03-11 SURGICAL SUPPLY — 25 items
BLOCK BITE 60FR ADLT L/F BLUE (MISCELLANEOUS) ×3 IMPLANT
ELECT REM PT RETURN 9FT ADLT (ELECTROSURGICAL) IMPLANT
ELECTRODE REM PT RTRN 9FT ADLT (ELECTROSURGICAL) IMPLANT
FCP BXJMBJMB 240X2.8X (CUTTING FORCEPS)
FLOOR PAD 36X40 (MISCELLANEOUS) ×3 IMPLANT
FORCEP RJ3 GP 1.8X160 W-NEEDLE (CUTTING FORCEPS) IMPLANT
FORCEPS BIOP RAD 4 LRG CAP 4 (CUTTING FORCEPS) IMPLANT
FORCEPS BIOP RJ4 240 W/NDL (CUTTING FORCEPS) IMPLANT
FORCEPS BXJMBJMB 240X2.8X (CUTTING FORCEPS) IMPLANT
INJECTOR/SNARE I SNARE (MISCELLANEOUS) IMPLANT
LUBRICANT JELLY 4.5OZ STERILE (MISCELLANEOUS) IMPLANT
MANIFOLD NEPTUNE II (INSTRUMENTS) IMPLANT
NDL SCLEROTHERAPY 25GX240 (NEEDLE) IMPLANT
NEEDLE SCLEROTHERAPY 25GX240 (NEEDLE) IMPLANT
PAD FLOOR 36X40 (MISCELLANEOUS) ×2 IMPLANT
PROBE APC STR FIRE (PROBE) IMPLANT
PROBE INJECTION GOLD (MISCELLANEOUS)
PROBE INJECTION GOLD 7FR (MISCELLANEOUS) IMPLANT
SNARE ROTATE MED OVAL 20MM (MISCELLANEOUS) IMPLANT
SNARE SHORT THROW 13M SML OVAL (MISCELLANEOUS) IMPLANT
SYR 50ML LL SCALE MARK (SYRINGE) IMPLANT
TRAP SPECIMEN MUCOUS 40CC (MISCELLANEOUS) IMPLANT
TUBING ENDO SMARTCAP PENTAX (MISCELLANEOUS) ×6 IMPLANT
TUBING IRRIGATION ENDOGATOR (MISCELLANEOUS) ×3 IMPLANT
WATER STERILE IRR 1000ML POUR (IV SOLUTION) IMPLANT

## 2021-03-11 NOTE — Progress Notes (Signed)
Received a page from RN about patient complaining of severe abdominal pain .  Patient is strictly n.p.o. and bradycardic at this point --Ordered 50 MCG IV fentanyl  Honor Junes, MD PGY-1, Resident

## 2021-03-11 NOTE — Transfer of Care (Signed)
Immediate Anesthesia Transfer of Care Note  Patient: Brent Rogers  Procedure(s) Performed: COLONOSCOPY WITH PROPOFOL ESOPHAGOGASTRODUODENOSCOPY (EGD) WITH PROPOFOL SAVORY DILATION HEMOSTASIS CLIP PLACEMENT HEMOSTASIS CONTROL  Patient Location: PACU  Anesthesia Type:MAC  Level of Consciousness: awake and alert   Airway & Oxygen Therapy: Patient Spontanous Breathing and Patient connected to nasal cannula oxygen  Post-op Assessment: Report given to RN and Post -op Vital signs reviewed and stable  Post vital signs: Reviewed and stable  Last Vitals:  Vitals Value Taken Time  BP 167/105 03/11/21 1237  Temp    Pulse 42 03/11/21 1238  Resp 14 03/11/21 1238  SpO2 96 % 03/11/21 1238  Vitals shown include unvalidated device data.  Last Pain:  Vitals:   03/11/21 1043  TempSrc: Temporal  PainSc: 8       Patients Stated Pain Goal: 6 (95/74/73 4037)  Complications: No notable events documented.

## 2021-03-11 NOTE — Progress Notes (Signed)
Chest x-ray normal.  Monitor overnight.  Repeat chest x-ray in the morning.  If it remains normal and if there is no new swelling in the neck then we can start him on a clear liquid diet.

## 2021-03-11 NOTE — Op Note (Signed)
[facility] Patient Name: Brent Rogers Procedure Date : 03/11/2021 MRN: [id number] Attending MD: Carol Ada , MD Date of Birth: [birth year] CSN: [id number] Age: 55 Admit Type: Inpatient Procedure:                Upper GI endoscopy Indications:              Iron deficiency anemia, Dysphagia Providers:                Carol Ada, MD, Kary Kos RN, RN, Lesia Sago, Technician Referring MD:              Medicines:                Propofol per Anesthesia Complications:            No immediate complications. Estimated Blood Loss:     Estimated blood loss was minimal. Procedure:                Pre-Anesthesia Assessment:                           - Prior to the procedure, a History and Physical                            was performed, and patient medications and                            allergies were reviewed. The patient's tolerance of                            previous anesthesia was also reviewed. The risks                            and benefits of the procedure and the sedation                            options and risks were discussed with the patient.                            All questions were answered, and informed consent                            was obtained. Prior Anticoagulants: The patient has                            taken no previous anticoagulant or antiplatelet                            agents. ASA Grade Assessment: III - A patient with                            severe systemic disease. After reviewing the risks  and benefits, the patient was deemed in                            satisfactory condition to undergo the procedure.                           - Sedation was administered by an anesthesia                            professional. Deep sedation was attained.                           After obtaining informed consent, the endoscope was                            passed under  direct vision. Throughout the                            procedure, the patient's blood pressure, pulse, and                            oxygen saturations were monitored continuously. The                            PCF-H190DL ([id number]) Olympus pediatric colonoscope                            was introduced through the mouth, and advanced to                            the jejunum. The upper GI endoscopy was                            accomplished without difficulty. The patient                            tolerated the procedure well. Scope In: Scope Out: Findings:      One benign-appearing, intrinsic mild stenosis was found at the       cricopharyngeus. This stenosis measured 1.6 cm (inner diameter) x less       than one cm (in length). The stenosis was traversed. The dilation site       was examined following endoscope reinsertion and showed complete       resolution of luminal narrowing. With the reinspection there appears to       be a perforation of the superior posterior pharynx.      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 This was       characterized by healthy appearing mucosa.      Diffuse moderately friable mucosa with active bleeding was found in the       jejunum. Coagulation for hemostasis using monopolar probe was       successful. For hemostasis, two hemostatic clips were successfully       placed (MR conditional). There was no bleeding at the end of the       procedure.  At the gastrojejunal anastomosis there was evidence of friability at the       jejunum. It was not clear initially if the bleeding was spontaneous from       from trauma with the scope. It was difficult to see initially as the       patient was difficult to sedate and there were bubbles in the gastric       lumen. Clear the visual field did show evidence of bleeding at the       jejunum near the anastomosis. The bleeding continued to persist and the       decision was made to  [facility] the area, which arrested the bleeding. Two       hemoclips were successfully deployed. Attention was now directed to       dilate the esophagus. The initial evaluation showed that the esophagus       was widely patent. Over a guidewire an 18 mm Savary dilation was       performed and there was some mild to moderate difficulty with insertion       of the dilator. With a slow and gentle forward pressure the dilator       slowly passed into the esophagus. 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 Closer inspection of the oropharyngeal region showed abnormal       mucosa. There was concern for a perforation in this area. Close       monitoring of the neck did not show any crepitus and the patient did not       develp any respiratory compromise. A stat ENT consultation was made for       further evaluation. The colonoscopy was performed without any issues.       Post procedure evaluation of the patient continued to show stability.       The patient did report some mild soreness. Dr. Constance Holster, ENT, was present       at the time of this questioning. Impression:               - Benign-appearing esophageal stenosis.                           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                           - Bleeding friable jejunal mucosa. Treated with a                            monopolar probe. Clips (MR conditional) were placed.                           - No specimens collected. Recommendation:           - Return patient to hospital ward for ongoing care.                           - NPO.                           -  Continue present medications.                           - Stat ENT consultation.                           - Proceed with the colonoscopy as there was no                            evidence of respiratory compromise or any other                             immediate complication.                           - Clindamycin 600 mg IVq8 hours. 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 All rights reserved. The codes documented in this report are preliminary and upon coder review may  be revised to meet current compliance requirements. Carol Ada, MD Carol Ada, MD 03/11/2021 1:05:51 PM This report has been signed electronically. Number of Addenda: 0

## 2021-03-11 NOTE — Anesthesia Procedure Notes (Signed)
Procedure Name: MAC Date/Time: 03/11/2021 11:43 AM Performed by: Georgia Duff, CRNA Pre-anesthesia Checklist: Patient identified Oxygen Delivery Method: Nasal cannula Induction Type: IV induction Dental Injury: Teeth and Oropharynx as per pre-operative assessment

## 2021-03-11 NOTE — Op Note (Signed)
[facility] Patient Name: Brent Rogers Procedure Date : 03/11/2021 MRN: [id number] Attending MD: Carol Ada , MD Date of Birth: 09/14/1966 CSN: [id number] Age: 55 Admit Type: Inpatient Procedure:                Colonoscopy Indications:              Iron deficiency anemia Providers:                Carol Ada, MD, Kary Kos RN, RN, Lesia Sago, Technician Referring MD:              Medicines:                 Complications:            No immediate complications. Estimated Blood Loss:     Estimated blood loss: none. Procedure:                Pre-Anesthesia Assessment:                           - Prior to the procedure, a History and Physical                            was performed, and patient medications and                            allergies were reviewed. The patient's tolerance of                            previous anesthesia was also reviewed. The risks                            and benefits of the procedure and the sedation                            options and risks were discussed with the patient.                            All questions were answered, and informed consent                            was obtained. Prior Anticoagulants: The patient has                            taken no previous anticoagulant or antiplatelet                            agents. ASA Grade Assessment: III - A patient with                            severe systemic disease. After reviewing the risks                            and benefits,  the patient was deemed in                            satisfactory condition to undergo the procedure.                           - Sedation was administered by an anesthesia                            professional. Deep sedation was attained.                           After obtaining informed consent, the colonoscope                            was passed under direct vision. Throughout the                             procedure, the patient's blood pressure, pulse, and                            oxygen saturations were monitored continuously. The                            PCF-H190DL ([id number]) Olympus pediatric colonoscope                            was introduced through the anus and advanced to the                            the cecum, identified by appendiceal orifice and                            ileocecal valve. The colonoscopy was performed                            without difficulty. The patient tolerated the                            procedure well. The quality of the bowel                            preparation was good. The ileocecal valve,                            appendiceal orifice, and rectum were photographed. Scope In: 12:10:40 PM Scope Out: 12:25:35 PM Scope Withdrawal Time: 0 hours 9 minutes 48 seconds  Total Procedure Duration: 0 hours 14 minutes 55 seconds  Findings:      Scattered small and large-mouthed diverticula were found in the entire       colon. Impression:               - Diverticulosis in the entire examined colon.                           -  No specimens collected. Recommendation:           - Return patient to hospital ward for ongoing care.                           - Resume regular diet.                           - Continue present medications.                           - Repeat colonoscopy in 10 years for screening                            purposes. Procedure Code(s):        --- Professional ---                           [phone], Colonoscopy, flexible; diagnostic, including                            collection of specimen(s) by brushing or washing,                            when performed (separate procedure) Diagnosis Code(s):        --- Professional ---                           D50.9, Iron deficiency anemia, unspecified                           K57.30, Diverticulosis of large intestine without                            perforation or abscess  without bleeding CPT copyright 2019 American Medical Association. All rights reserved. The codes documented in this report are preliminary and upon coder review may  be revised to meet current compliance requirements. Carol Ada, MD Carol Ada, MD 03/11/2021 12:33:39 PM This report has been signed electronically. Number of Addenda: 0

## 2021-03-11 NOTE — Progress Notes (Signed)
FPTS Interim Progress Note  Patient sleeping and resting comfortably.  Rounded with primary RN.  No concerns voiced. Bowel prep completed.  No orders required.  Appreciated nightly round.  Today's Vitals   03/10/21 2037 03/11/21 0101 03/11/21 0146 03/11/21 0405  BP:    (!) 142/98  Pulse:    (!) 52  Resp:    16  Temp:    98.2 F (36.8 C)  TempSrc:    Oral  SpO2:    100%  Weight:      Height:      PainSc: 0-No pain 6  Asleep     Carollee Leitz, MD 03/11/2021, 6:21 AM PGY-2, [facility] pager [phone]

## 2021-03-11 NOTE — Progress Notes (Signed)
Family Medicine Teaching Service Daily Progress Note Intern Pager: [phone]  Patient name: Brent Rogers Medical record number: [id number] Date of birth: 01-Dec-1965 Age: 55 y.o. Gender: male  Primary Care Provider: Ladell Pier, MD Consultants: GI, Cards Code Status: Full  Pt Overview and Major Events to Date:  03/06/2021: Admitted to FPTS  Assessment and Plan: Brent Rogers is a 55 y.o. M presented with acute abdominal pain . PMHx significant for chronic pancreatitis, Whipple's procedure, HTN, Migraine, Scoliosis, fatty liver disease, normocytic anemia.   Acute on Chronic Chest/Abdominal Pain Plan for EGD/Colonoscopy today.  GI following.  Appreciate recs. - follow up GI recs - Obtain Magnesium and CBC post procedure   FEN/GI: NPO for procedure PPx: SCD's, restart Lovenox poat-procedure   Status is: Inpatient  Remains inpatient appropriate because:Ongoing diagnostic testing needed not appropriate for outpatient work up  Dispo: The patient is from: Home              Anticipated d/c is to: Home              Patient currently is not medically stable to d/c.   Difficult to place patient No    Subjective:  Patient feeling well this morning.  Still haing some abdominal pain.    Objective: Temp:  [98.2 F (36.8 C)-98.9 F (37.2 C)] 98.2 F (36.8 C) (06/13 0405) Pulse Rate:  [50-52] 52 (06/13 0405) Resp:  [14-17] 16 (06/13 0405) BP: (142-158)/(91-98) 142/98 (06/13 0405) SpO2:  [99 %-100 %] 100 % (06/13 0405) Physical Exam:  Physical Exam Constitutional:      General: He is not in acute distress. HENT:     Head: Normocephalic and atraumatic.  Cardiovascular:     Rate and Rhythm: Regular rhythm. Bradycardia present.     Heart sounds: Normal heart sounds.  Abdominal:     General: Bowel sounds are increased.     Tenderness: There is abdominal tenderness in the left upper quadrant and left lower quadrant.  Skin:    General: Skin is warm.  Neurological:      General: No focal deficit present.     Mental Status: He is alert.  Psychiatric:        Mood and Affect: Mood normal.        Behavior: Behavior normal.     Laboratory: Recent Labs  Lab 03/09/21 0019 03/09/21 0436 03/10/21 0146  WBC 8.2 8.1 7.0  HGB 8.8* 8.9* 9.0*  HCT 27.4* 28.0* 27.8*  PLT 223 200 207   Recent Labs  Lab 03/05/21 2227 03/06/21 1805 03/07/21 0139 03/09/21 0436 03/10/21 0146  NA 138 137 139 135 134*  K 3.8 3.6 3.9 3.8 4.0  CL 106 105 106 104 104  CO2 22 22 29 24 27   BUN 5* 5* <5* <5* <5*  CREATININE 0.72 0.75 0.77 0.75 0.87  CALCIUM 8.4* 8.2* 8.5* 8.0* 8.2*  PROT 6.0* 5.4* 5.1*  --   --   BILITOT 0.4 0.7 0.5  --   --   ALKPHOS 85 80 74  --   --   ALT 15 14 14   --   --   AST 40 38 30  --   --   GLUCOSE 105* 179* 91 103* 99    Imaging/Diagnostic Tests: No new imaging  Delora Fuel, MD 03/11/2021, 6:46 AM PGY-1, [place] Intern pager: [phone], text pages welcome

## 2021-03-11 NOTE — Interval H&P Note (Signed)
History and Physical Interval Note:  03/11/2021 11:39 AM  Brent Rogers  has presented today for surgery, with the diagnosis of [place] deficiency anemia.  The various methods of treatment have been discussed with the patient and family. After consideration of risks, benefits and other options for treatment, the patient has consented to  Procedure(s): COLONOSCOPY WITH PROPOFOL (N/A) ESOPHAGOGASTRODUODENOSCOPY (EGD) WITH PROPOFOL (N/A) as a surgical intervention.  The patient's history has been reviewed, patient examined, no change in status, stable for surgery.  I have reviewed the patient's chart and labs.  Questions were answered to the patient's satisfaction.     [name]

## 2021-03-11 NOTE — Consult Note (Signed)
Reason for Consult: Possible esophageal perforation Referring Physician: Kinnie Feil, MD  Brent Rogers is an 54 y.o. male.  HPI: History of esophageal web, underwent esophagoscopy with dilation of the web and colonoscopy today for work-up of syncope.  The esophageal dilatation was done over a guidewire.  There was concern at the termination of the procedure that there may have been a defect in the posterior pharyngeal wall.  Dr. Benson Norway has provided me photographs from his endoscopy.  As far as I can tell there is no advancement of the dilator into the mediastinal soft tissue.  The dilator passed along the guidewire and through the esophagus and the dilator Hayden Pedro was successful.  There was concern about the posterior pharyngeal wound.  After the endoscopy he underwent colonoscopy successfully and during that time interim there is no new signs or symptoms such as fever, shortness of breath, chest pain, difficulty swallowing or neck swelling or crepitance.  Past Medical History:  Diagnosis Date   Benign tumor of endocrine pancreas    Chronic pancreatitis ([place])    S/P Whipple   Foot ulcer with fat layer exposed ([place])    Hypertension    Migraine    "@ least once/month" (11/12/2015)   Pancreatic abnormality    CT  shows mass   Pneumonia 11/12/2015   Scoliosis     Past Surgical History:  Procedure Laterality Date   BACK SURGERY     EUS N/A 09/21/2013   Procedure: ESOPHAGEAL ENDOSCOPIC ULTRASOUND (EUS) RADIAL;  Surgeon: Beryle Beams, MD;  Location: [facility];  Service: Endoscopy;  Laterality: N/A;   EUS N/A 09/30/2013   Procedure: UPPER ENDOSCOPIC ULTRASOUND (EUS) LINEAR;  Surgeon: Beryle Beams, MD;  Location: [facility];  Service: Endoscopy;  Laterality: N/A;   FINE NEEDLE ASPIRATION N/A 09/21/2013   Procedure: FINE NEEDLE ASPIRATION (FNA) LINEAR;  Surgeon: Beryle Beams, MD;  Location: [facility];  Service: Endoscopy;  Laterality: N/A;   FINGER FRACTURE SURGERY Left 1995    5th digit   FRACTURE SURGERY     KNEE ARTHROSCOPY Bilateral 1987-1989   right-left   LAPAROSCOPY N/A 12/01/2013   Procedure: LAPAROSCOPY DIAGNOSTIC PANCREATICODUODENECTOMY WITH BILIARY AND PANCREATIC STENTS;  Surgeon: Stark Klein, MD;  Location: [facility];  Service: General;  Laterality: N/A;   LUMBAR [place] SURGERY  1990's   WHIPPLE PROCEDURE  12/01/2013    Family History  Problem Relation Age of Onset   Cancer Mother        unsure   COPD Mother    Hypertension Mother    Hypertension Father    COPD Father    CAD Sister        possible has stent per pt    Social History:  reports that he has been smoking cigarettes. He has a 7.50 pack-year smoking history. He has quit using smokeless tobacco.  His smokeless tobacco use included snuff. He reports current alcohol use of about 4.0 standard drinks of alcohol per week. He reports that he does not use drugs.  Allergies:  Allergies  Allergen Reactions   No Known Allergies     Medications: Reviewed  Results for orders placed or performed during the hospital encounter of 03/05/21 (from the past 48 hour(s))  Basic metabolic panel     Status: Abnormal   Collection Time: 03/10/21  1:46 AM  Result Value Ref Range   Sodium 134 (L) 135 - 145 mmol/L   Potassium 4.0 3.5 - 5.1 mmol/L   Chloride 104  98 - 111 mmol/L   CO2 27 22 - 32 mmol/L   Glucose, Bld 99 70 - 99 mg/dL    Comment: Glucose reference range applies only to samples taken after fasting for at least 8 hours.   BUN <5 (L) 6 - 20 mg/dL   Creatinine, Ser 0.87 0.61 - 1.24 mg/dL   Calcium 8.2 (L) 8.9 - 10.3 mg/dL   GFR, Estimated >60 >60 mL/min    Comment: (NOTE) Calculated using the CKD-EPI Creatinine Equation (2021)    Anion gap 3 (L) 5 - 15    Comment: Performed at [facility] [address], [place], [place] [address]  CBC     Status: Abnormal   Collection Time: 03/10/21  1:46 AM  Result Value Ref Range   WBC 7.0 4.0 - 10.5 K/uL   RBC 3.32 (L) 4.22 - 5.81 MIL/uL    Hemoglobin 9.0 (L) 13.0 - 17.0 g/dL   HCT 27.8 (L) 39.0 - 52.0 %   MCV 83.7 80.0 - 100.0 fL   MCH 27.1 26.0 - 34.0 pg   MCHC 32.4 30.0 - 36.0 g/dL   RDW 20.3 (H) 11.5 - 15.5 %   Platelets 207 150 - 400 K/uL   nRBC 0.0 0.0 - 0.2 %    Comment: Performed at [facility], [place] [address], [place], [place] [address]  Magnesium     Status: Abnormal   Collection Time: 03/10/21  1:46 AM  Result Value Ref Range   Magnesium 1.6 (L) 1.7 - 2.4 mg/dL    Comment: Performed at [facility] [address], [place], [place] [address]  Glucose, capillary     Status: None   Collection Time: 03/11/21  7:23 AM  Result Value Ref Range   Glucose-Capillary 90 70 - 99 mg/dL    Comment: Glucose reference range applies only to samples taken after fasting for at least 8 hours.    No results found.  KGM:[id number] except as listed in admit H&P  Blood pressure (!) 165/87, pulse (!) 44, temperature 97.7 F (36.5 C), temperature source Temporal, resp. rate 17, height 6\' 4"  (1.93 m), weight 60.7 kg, SpO2 100 %.  PHYSICAL EXAM: Overall appearance:  Healthy appearing, in no distress, little bit sleepy from the sedation.  Breathing and voice are clear. Head:  Normocephalic, atraumatic. Ears: External ears appear normal. Nose: External nose is healthy in appearance. Internal nasal exam free of any lesions or obstruction. Oral Cavity/Pharynx:  There are no mucosal lesions or masses identified. Larynx/Hypopharynx: Deferred Neuro:  No identifiable neurologic deficits. Neck: No palpable neck masses.  There is no swelling or crepitance.  Studies Reviewed: Photographs from the endoscopy were reviewed.  Procedures: none   Assessment/Plan: Posterior pharyngeal wound without evidence of esophageal or pharyngeal perforation.  There is no swelling or crepitance.  There is no shortness of breath or difficulty swallowing.  He has mild pain with swallowing.  Recommend keep n.p.o. overnight to make sure there is  no new developing signs or symptoms such as swelling or crepitance of the neck or any signs or symptoms of mediastinitis.  As long as he does well by tomorrow we can start him on liquids and make sure that there is no new symptoms that develop.  Is very unlikely that surgical intervention will be needed but in the event that it is then neck exploration may be necessary.  Izora Gala 03/11/2021, 1:05 PM    ICD10- G64.40HK

## 2021-03-12 ENCOUNTER — Inpatient Hospital Stay (HOSPITAL_COMMUNITY): Payer: Medicaid Other

## 2021-03-12 LAB — CBC
HCT: 27.9 % — ABNORMAL LOW (ref 39.0–52.0)
HCT: 35.5 % — ABNORMAL LOW (ref 39.0–52.0)
Hemoglobin: 9 g/dL — ABNORMAL LOW (ref 13.0–17.0)
Hemoglobin: 11.1 g/dL — ABNORMAL LOW (ref 13.0–17.0)
MCH: 27.1 pg (ref 26.0–34.0)
MCH: 26.6 pg (ref 26.0–34.0)
MCHC: 32.3 g/dL (ref 30.0–36.0)
MCHC: 31.3 g/dL (ref 30.0–36.0)
MCV: 84 fL (ref 80.0–100.0)
MCV: 84.9 fL (ref 80.0–100.0)
Platelets: 170 10*3/uL (ref 150–400)
Platelets: 174 10*3/uL (ref 150–400)
RBC: 3.32 MIL/uL — ABNORMAL LOW (ref 4.22–5.81)
RBC: 4.18 MIL/uL — ABNORMAL LOW (ref 4.22–5.81)
RDW: 20.6 % — ABNORMAL HIGH (ref 11.5–15.5)
RDW: 20.8 % — ABNORMAL HIGH (ref 11.5–15.5)
WBC: 13.7 10*3/uL — ABNORMAL HIGH (ref 4.0–10.5)
WBC: 10.2 10*3/uL (ref 4.0–10.5)
nRBC: 0 % (ref 0.0–0.2)
nRBC: 0 % (ref 0.0–0.2)

## 2021-03-12 LAB — BASIC METABOLIC PANEL
Anion gap: 5 (ref 5–15)
BUN: <5 mg/dL — ABNORMAL LOW (ref 6–20)
CO2: 24 mmol/L (ref 22–32)
Calcium: 8.2 mg/dL — ABNORMAL LOW (ref 8.9–10.3)
Chloride: 102 mmol/L (ref 98–111)
Creatinine, Ser: 0.77 mg/dL (ref 0.61–1.24)
GFR, Estimated: >60 mL/min (ref >60)
Glucose, Bld: 86 mg/dL (ref 70–99)
Potassium: 3.8 mmol/L (ref 3.5–5.1)
Sodium: 131 mmol/L — ABNORMAL LOW (ref 135–145)

## 2021-03-12 LAB — MAGNESIUM: Magnesium: 1.8 mg/dL (ref 1.7–2.4)

## 2021-03-12 MED ORDER — ENOXAPARIN SODIUM 40 MG/0.4ML IJ SOSY
40.0000 mg | PREFILLED_SYRINGE | INTRAMUSCULAR | Status: DC
  Administered 2021-03-12 – 2021-03-13 (×2): 40 mg via SUBCUTANEOUS
  Filled 2021-03-12 (×2): qty 0.4

## 2021-03-12 MED ORDER — ONDANSETRON 4 MG PO TBDP
4.0000 mg | ORAL_TABLET | Freq: Once | ORAL | Status: AC
  Administered 2021-03-12: 4 mg via ORAL
  Filled 2021-03-12: qty 1

## 2021-03-12 NOTE — Progress Notes (Signed)
Postop day 1, no complaints.  Mild soreness in his throat.  Voice and breathing are completely normal.  Neck without any swelling or crepitance.  Chest x-ray normal.  Proceed with advancing diet to regular diet.  Follow-up if any additional concerns arise.

## 2021-03-12 NOTE — Progress Notes (Signed)
Spoke with Dr Benson Norway on phone.  Indicates fine to stop Clindamycin.   Delora Fuel, MD 03/12/2021, 2:21 PM PGY-1, [place] Medicine Service pager [phone]

## 2021-03-12 NOTE — Progress Notes (Signed)
Family Medicine Teaching Service Daily Progress Note Intern Pager: [phone]  Patient name: Brent Rogers Medical record number: [id number] Date of birth: Nov 02, 1965 Age: 55 y.o. Gender: male  Primary Care Provider: Ladell Pier, MD Consultants: GI, ENT Code Status: Full  Pt Overview and Major Events to Date:  03/06/2021: Admitted to [place] 03/11/2021: EGD/Colonoscopy  Assessment and Plan: Brent Rogers is a 55 y.o. M presented with acute abdominal pain . PMHx significant for chronic pancreatitis, Whipple's procedure, HTN, Migraine, Scoliosis, fatty liver disease, normocytic anemia.    Acute on Chronic Abdominal Pain Had EGD/Colonoscopy done yesterday.  Esophageal web dilated.  Clip placed on bleeding & friable mucosa.   ENT consulted for concern for Pharengeal perforation.    Chest X-Ray normal, following up repeat Chest X-Ray also normal.  Cleared by ENT for full diet.  Received 2 doses IV Fentanyl for pain yesterday. - GI & ENT following  - Restart home medications - Advance diet to regular - Discuss with GI on need for antibiotics - Restart Lovenox  Anemia Hgb 9.0 down from 11.8 yesterday.  Was stable around 9 before yesterday.  Possible innacurate read yesterday.  - Repeat CBC this afternoon    FEN/GI: Regular diet PPx: Restart Lovenox   Status is: Inpatient  Remains inpatient appropriate because:Ongoing diagnostic testing needed not appropriate for outpatient work up  Dispo: The patient is from: Home              Anticipated d/c is to: Home              Patient currently is not medically stable to discharge   Difficult to place patient No   Subjective:  Patient indicates feeling well this morning.  Objective: Temp:  [97.7 F (36.5 C)-98 F (36.7 C)] 97.8 F (36.6 C) (06/14 0435) Pulse Rate:  [43-59] 58 (06/14 0435) Resp:  [12-20] 19 (06/14 0435) BP: (107-198)/(79-105) 107/79 (06/14 0435) SpO2:  [97 %-100 %] 100 % (06/14 0435) Physical  Exam:  Physical Exam Constitutional:      Appearance: He is well-developed.  HENT:     Head: Normocephalic and atraumatic.  Cardiovascular:     Rate and Rhythm: Normal rate and regular rhythm.  Pulmonary:     Effort: Pulmonary effort is normal.     Breath sounds: Normal breath sounds.  Abdominal:     General: Bowel sounds are normal.     Tenderness: There is abdominal tenderness in the left upper quadrant and left lower quadrant. There is no guarding.  Neurological:     Mental Status: He is alert.     Laboratory: Recent Labs  Lab 03/10/21 0146 03/11/21 1451 03/12/21 0113  WBC 7.0 4.6 13.7*  HGB 9.0* 11.8* 9.0*  HCT 27.8* 37.6* 27.9*  PLT 207 190 170   Recent Labs  Lab 03/05/21 2227 03/06/21 1805 03/07/21 0139 03/09/21 0436 03/10/21 0146 03/12/21 0113  NA 138 137 139 135 134* 131*  K 3.8 3.6 3.9 3.8 4.0 3.8  CL 106 105 106 104 104 102  CO2 22 22 29 24 27 24   BUN 5* 5* <5* <5* <5* <5*  CREATININE 0.72 0.75 0.77 0.75 0.87 0.77  CALCIUM 8.4* 8.2* 8.5* 8.0* 8.2* 8.2*  PROT 6.0* 5.4* 5.1*  --   --   --   BILITOT 0.4 0.7 0.5  --   --   --   ALKPHOS 85 80 74  --   --   --   ALT 15 14 14   --   --   --  AST 40 38 30  --   --   --   GLUCOSE 105* 179* 91 103* 99 86    Mg-1.8  Imaging/Diagnostic Tests:  EXAM: CHEST - 2 VIEW   COMPARISON:  Prior chest radiographs 03/11/2021 and earlier.   FINDINGS: Heart size within normal limits. No appreciable airspace consolidation. No appreciable pneumothorax or pneumomediastinum. No evidence of pleural effusion. No acute bony abnormality identified.   IMPRESSION: No evidence of acute cardiopulmonary abnormality. No evidence of pneumothorax or pneumomediastinum.     Electronically Signed   By: Kellie Simmering DO   On: 03/12/2021 08:13  Brent Rogers, Arnette Norris, MD 03/12/2021, 7:27 AM PGY-1, [place] Intern pager: [phone], text pages welcome

## 2021-03-12 NOTE — Progress Notes (Signed)
FPTS Interim Progress Note  Patient sleeping and resting comfortably.  Rounded with primary RN. Reports no increase in neck swelling and no worsening with swallowing.  Has received Fentanyl 12.5 mg IV x 1 with good relief.   No concerns voiced.  No orders required.  Appreciated nightly round.  Today's Vitals   03/11/21 1547 03/11/21 2056 03/11/21 2207 03/12/21 0435  BP:  127/81  107/79  Pulse:  (!) 54  (!) 58  Resp:  18  19  Temp:  97.9 F (36.6 C)  97.8 F (36.6 C)  TempSrc:  Axillary  Axillary  SpO2:  100%  100%  Weight:      Height:      PainSc: Asleep  8      NPO overnight Follow up  ENT recs Follow up GI recs  Carollee Leitz, MD 03/12/2021, 5:31 AM PGY-2, [place] Medicine Service pager [phone]

## 2021-03-12 NOTE — Progress Notes (Signed)
Subjective: Feeling well.  The reports that his throat pain is 90% better.  Objective: Vital signs in last 24 hours: Temp:  [97.7 F (36.5 C)-98 F (36.7 C)] 97.8 F (36.6 C) (06/14 0435) Pulse Rate:  [43-59] 58 (06/14 0435) Resp:  [12-20] 19 (06/14 0435) BP: (107-198)/(79-105) 107/79 (06/14 0435) SpO2:  [97 %-100 %] 100 % (06/14 0435) Last BM Date: 03/11/21  Intake/Output from previous day: 06/13 0701 - 06/14 0700 In: 1452.7 [I.V.:1300.1; IV Piggyback:152.6] Out: 1205 [Urine:1205] Intake/Output this shift: Total I/O In: 800.1 [I.V.:700.1; IV Piggyback:100] Out: 780 [Urine:780]  General appearance: alert and no distress GI: soft, non-tender; bowel sounds normal; no masses,  no organomegaly  Neck: No crepitus  Lab Results: Recent Labs    03/10/21 0146 03/11/21 1451 03/12/21 0113  WBC 7.0 4.6 13.7*  HGB 9.0* 11.8* 9.0*  HCT 27.8* 37.6* 27.9*  PLT 207 190 170   BMET Recent Labs    03/10/21 0146 03/12/21 0113  NA 134* 131*  K 4.0 3.8  CL 104 102  CO2 27 24  GLUCOSE 99 86  BUN <5* <5*  CREATININE 0.87 0.77  CALCIUM 8.2* 8.2*   LFT No results for input(s): PROT, ALBUMIN, AST, ALT, ALKPHOS, BILITOT, BILIDIR, IBILI in the last 72 hours. PT/INR No results for input(s): LABPROT, INR in the last 72 hours. Hepatitis Panel No results for input(s): HEPBSAG, HCVAB, HEPAIGM, HEPBIGM in the last 72 hours. C-Diff No results for input(s): CDIFFTOX in the last 72 hours. Fecal Lactopherrin No results for input(s): FECLLACTOFRN in the last 72 hours.  Studies/Results: DG Chest 2 View  Result Date: 03/11/2021 CLINICAL DATA:  Esophageal stricture dilatation EXAM: CHEST - 2 VIEW COMPARISON:  March 05, 2021 FINDINGS: No evident pneumothorax or pneumomediastinum. Lungs are clear. Heart size and pulmonary vascularity are normal. No adenopathy. No bone lesions. IMPRESSION: Lungs clear. No pneumothorax or pneumomediastinum. Heart size normal. Electronically Signed   By: Lowella Grip III M.D.   On: 03/11/2021 13:41    Medications: Scheduled:  ferrous sulfate  325 mg Oral Daily   lipase/protease/amylase  24,000 Units Oral TID WC   nicotine  21 mg Transdermal Daily   pantoprazole  40 mg Oral Daily   Continuous:  clindamycin (CLEOCIN) IV 600 mg (03/12/21 0503)   lactated ringers 100 mL/hr at 03/12/21 0502    Assessment/Plan: 1) S/p dilation of a cervical esophageal web. 2) Trauma to the posterior pharyngeal region. 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 4) Chronic pancreatitis.   The patient is well at this time.  He does not have any neck swelling and he reports that his odynophagia complaints form yesterday are 90% better.  The WBC was note to be elevated at 13, which is an increase from 4.6.  No fever was detected overnight and examination of his neck did not show any swelling or palpable crepitus.  His HGB is stable at his baseline.  It is not clear why  his HGB on 03/11/2021 showed a value of 11.8 as there was no much bleeding with the interventions yesterday.  Plan: 1) Advance diet per ENT. 2) Continue to follow HGB. 3) Pain medications PRN for chronic pancreatitis.  LOS: 4 days   Brent Rogers D 03/12/2021, 6:59 AM

## 2021-03-12 NOTE — Anesthesia Postprocedure Evaluation (Signed)
Anesthesia Post Note  Patient: Brent Rogers  Procedure(s) Performed: COLONOSCOPY WITH PROPOFOL ESOPHAGOGASTRODUODENOSCOPY (EGD) WITH PROPOFOL SAVORY DILATION HEMOSTASIS CLIP PLACEMENT HEMOSTASIS CONTROL     Patient location during evaluation: PACU Anesthesia Type: MAC Level of consciousness: awake and alert Pain management: pain level controlled Vital Signs Assessment: post-procedure vital signs reviewed and stable Respiratory status: spontaneous breathing, nonlabored ventilation, respiratory function stable and patient connected to nasal cannula oxygen Cardiovascular status: stable and blood pressure returned to baseline Postop Assessment: no apparent nausea or vomiting Anesthetic complications: no   No notable events documented.               Effie Berkshire

## 2021-03-13 ENCOUNTER — Other Ambulatory Visit: Payer: Self-pay

## 2021-03-13 ENCOUNTER — Encounter (HOSPITAL_COMMUNITY): Payer: Self-pay | Admitting: Gastroenterology

## 2021-03-13 LAB — BASIC METABOLIC PANEL
Anion gap: 7 (ref 5–15)
BUN: 5 mg/dL — ABNORMAL LOW (ref 6–20)
CO2: 23 mmol/L (ref 22–32)
Calcium: 8.2 mg/dL — ABNORMAL LOW (ref 8.9–10.3)
Chloride: 106 mmol/L (ref 98–111)
Creatinine, Ser: 0.85 mg/dL (ref 0.61–1.24)
GFR, Estimated: >60 mL/min (ref >60)
Glucose, Bld: 140 mg/dL — ABNORMAL HIGH (ref 70–99)
Potassium: 3.8 mmol/L (ref 3.5–5.1)
Sodium: 136 mmol/L (ref 135–145)

## 2021-03-13 LAB — CBC
HCT: 26.8 % — ABNORMAL LOW (ref 39.0–52.0)
Hemoglobin: 8.4 g/dL — ABNORMAL LOW (ref 13.0–17.0)
MCH: 26.7 pg (ref 26.0–34.0)
MCHC: 31.3 g/dL (ref 30.0–36.0)
MCV: 85.1 fL (ref 80.0–100.0)
Platelets: 141 10*3/uL — ABNORMAL LOW (ref 150–400)
RBC: 3.15 MIL/uL — ABNORMAL LOW (ref 4.22–5.81)
RDW: 21.2 % — ABNORMAL HIGH (ref 11.5–15.5)
WBC: 7.1 10*3/uL (ref 4.0–10.5)
nRBC: 0 % (ref 0.0–0.2)

## 2021-03-13 MED ORDER — LIDOCAINE VISCOUS HCL 2 % MT SOLN
15.0000 mL | Freq: Once | OROMUCOSAL | Status: AC
  Administered 2021-03-13: 15 mL via ORAL
  Filled 2021-03-13: qty 15

## 2021-03-13 MED ORDER — ALUM & MAG HYDROXIDE-SIMETH 200-200-20 MG/5ML PO SUSP
30.0000 mL | Freq: Once | ORAL | Status: AC
  Administered 2021-03-13: 30 mL via ORAL
  Filled 2021-03-13: qty 30

## 2021-03-13 MED ORDER — TRAMADOL HCL 50 MG PO TABS
50.0000 mg | ORAL_TABLET | Freq: Two times a day (BID) | ORAL | 0 refills | Status: AC | PRN
  Filled 2021-03-13 – 2021-07-11 (×6): qty 10, 5d supply, fill #0

## 2021-03-13 NOTE — Progress Notes (Signed)
FPTS Interim Progress Note  Patient sleeping and resting comfortably.  Rounded with primary RN.  Received Zofran and Ultram and now sleeping. No concerns voiced.  No orders required.  Appreciated nightly round.  Today's Vitals   03/12/21 2225 03/12/21 2229 03/12/21 2315 03/13/21 0524  BP: (!) 136/101   (!) 128/96  Pulse: 70   63  Resp: 18   17  Temp: 97.9 F (36.6 C)   98.7 F (37.1 C)  TempSrc: Oral   Oral  SpO2: 100%   100%  Weight:      Height:      PainSc:  9  Asleep     Carollee Leitz, MD 03/13/2021, 7:11 AM PGY-2, [place] Service pager [phone]

## 2021-03-13 NOTE — Progress Notes (Signed)
Family Medicine Teaching Service Daily Progress Note Intern Pager: [phone]  Patient name: Brent Rogers Medical record number: [id number] Date of birth: 1966-03-14 Age: 54 y.o. Gender: male  Primary Care Provider: Ladell Pier, MD Consultants: GI, ENT Code Status: Full  Pt Overview and Major Events to Date:  03/06/2021: Admitted to [place] 03/11/2021: EGD/Colonoscopy  Assessment and Plan: Brent Rogers is a 55 y.o. M presented with acute abdominal pain . PMHx significant for chronic pancreatitis, Whipple's procedure, HTN, Migraine, Scoliosis, fatty liver disease, normocytic anemia.   Acute on Chronic Abdominal Pain Improved after procedure.  Able to tolerate solid foods.  Having diarrhea but no hematochazia or melena.  Possibly due to several days without food and dose of Clinda.  Low concern for C Diff given onlsingle dose.  GI indicate fine to D/C today. - Plan for discharge - follow-up with PCP this week if able - 3-in-1 DME order  Anemia 11.3>8.4.  No current sign of bleed.  GI indicates likely lab error.      - CBC outpatient  FEN/GI: Regular diet PPx: Lovenox   Status is: Inpatient   Dispo: The patient is from: Home              Anticipated d/c is to: Home              Patient currently is medically stable to d/c.   Difficult to place patient No   Subjective:  Patient indicates able to tolerate food well.  No issues outside of diarrhea.  Feels fine with going home today.  Asking for 3 -in -1 given diarrhea and long distance to bathroom from bedroom.  Objective: Temp:  [97.9 F (36.6 C)-98.8 F (37.1 C)] 98.7 F (37.1 C) (06/15 0524) Pulse Rate:  [63-70] 63 (06/15 0524) Resp:  [17-18] 17 (06/15 0524) BP: (112-136)/(80-101) 128/96 (06/15 0524) SpO2:  [100 %] 100 % (06/15 0524) Physical Exam:  Physical Exam Constitutional:      General: He is not in acute distress. HENT:     Head: Normocephalic and atraumatic.  Cardiovascular:     Rate and Rhythm:  Normal rate and regular rhythm.  Pulmonary:     Effort: Pulmonary effort is normal.     Breath sounds: Normal breath sounds.  Abdominal:     General: Bowel sounds are increased.     Palpations: Abdomen is soft.     Tenderness: There is abdominal tenderness in the left upper quadrant and left lower quadrant.  Skin:    General: Skin is warm.  Neurological:     Mental Status: He is alert.  Psychiatric:        Mood and Affect: Mood normal.        Behavior: Behavior normal.     Laboratory: Recent Labs  Lab 03/12/21 0113 03/12/21 1431 03/13/21 0205  WBC 13.7* 10.2 7.1  HGB 9.0* 11.1* 8.4*  HCT 27.9* 35.5* 26.8*  PLT 170 174 141*   Recent Labs  Lab 03/06/21 1805 03/07/21 0139 03/09/21 0436 03/10/21 0146 03/12/21 0113 03/13/21 0205  NA 137 139   < > 134* 131* 136  K 3.6 3.9   < > 4.0 3.8 3.8  CL 105 106   < > 104 102 106  CO2 22 29   < > 27 24 23   BUN 5* <5*   < > <5* <5* 5*  CREATININE 0.75 0.77   < > 0.87 0.77 0.85  CALCIUM 8.2* 8.5*   < > 8.2* 8.2*  8.2*  PROT 5.4* 5.1*  --   --   --   --   BILITOT 0.7 0.5  --   --   --   --   ALKPHOS 80 74  --   --   --   --   ALT 14 14  --   --   --   --   AST 38 30  --   --   --   --   GLUCOSE 179* 91   < > 99 86 140*   < > = values in this interval not displayed.    Imaging/Diagnostic Tests: No new imaging  Delora Fuel, MD 03/13/2021, 12:25 PM PGY-1, [place] Intern pager: [phone], text pages welcome

## 2021-03-13 NOTE — Progress Notes (Signed)
Pt's 3 by 1 bedside commode has been delivered to room.  Pt alert and oriented x4 in no acute distress. Discharge paperwork reviewed with pt. Pt verbalized understanding. Pt rolled down via wheelchair to front lobby by NTs. Pt's belongings taken with pt.

## 2021-03-13 NOTE — Discharge Instructions (Addendum)
Dear Brent Rogers,   Thank you for letting us participate in your care! In this section, you will find a brief hospital admission summary of why you were admitted to the hospital, what happened during your admission, your diagnosis/diagnoses, and recommended follow up.  You were admitted because you were experiencing Abdominal Pain, toruble swallowing. Your testing revealed Esophageal Web and Ulcer in your small intestine. You were treated with A dailation and clip in your small intestine You were also seen by Dr Benson Norway. Who indicated you were fine to go home. Your pain improved and you were discharged from the hospital for meeting this goal.    POST-HOSPITAL & CARE INSTRUCTIONS No medication changes. Please let PCP/Specialists know of any changes in medications that were made.    DOCTOR'S APPOINTMENTS & FOLLOW UP  Please schedule a follow-up appointment with your Primary Care Doctor, Dr Wynetta Emery in the next 1-2 weeks.   Thank you for choosing [facility]! Take care and be well!  [facility]  [place], [place] [address] [phone]

## 2021-03-13 NOTE — Progress Notes (Signed)
FPTS Interim Progress Note  S: Patient complaining of upset stomach and several rounds of watery diarrhea. Patient does not have any blood in his stools, N/V. He does note that he just starting the solid foods and this feeling happened not too long after he ate mashed potatoes.   O: BP (!) 128/96 (BP Location: Right Arm)   Pulse 63   Temp 98.7 F (37.1 C) (Oral)   Resp 17   Ht 6\' 4"  (1.93 m)   Wt 60.7 kg   SpO2 100%   BMI 16.29 kg/m   General: well-appearing, supine in bed, NAD Respiratory: breathing comfortably on room air, no increased WOB Abdomen: soft, non-distended, tenderness in LLQ, no rebound tenderness, no peritoneal signs  A/P: Abdominal pain  Watery diarrhea In the setting of restarting solid foods after several days and recently on Clindamycin. Reassured that there is no blood in the stool, no peritoneal signs. Labs do have a decrease in Hgb from 11.1>8.4, though all the cell lines appear down and patient was on LR fluids yesterday so possible dilutional component. Do not feet like this is related to a current bleed at this time but will continue to reassess. At this time, will give a GI cocktail and monitor for improvement. - GI cocktail - Continue to monitor abdominal pain and diarrhea  Neftali Abair, DO 03/13/2021, 5:51 AM PGY-1, [facility] pager [phone]

## 2021-03-14 ENCOUNTER — Telehealth: Payer: Self-pay

## 2021-03-14 DIAGNOSIS — K861 Other chronic pancreatitis: Secondary | ICD-10-CM

## 2021-03-14 DIAGNOSIS — K219 Gastro-esophageal reflux disease without esophagitis: Secondary | ICD-10-CM

## 2021-03-14 NOTE — Telephone Encounter (Signed)
Transition Care Management Follow-up Telephone Call Date of discharge and from where: 03/13/2021, [facility]  How have you been since you were released from the hospital? He said he had a rough week and was in the hospital but is doing better Any questions or concerns? Yes - noted below regarding medications.   Items Reviewed: Did the pt receive and understand the discharge instructions provided? Yes  Medications obtained and verified? No he does not have any medications and is requesting refills for all meds.  He does not have an ID so he is not able to get the tramadol. He said he is taking Extra Strength Tylenol as needed for pain.     Other? No  - Any new allergies since your discharge? No  Dietary orders reviewed? Yes - he stated that he is only able to tolerate liquids due to the "stitches" in his throat from the EGD. He plans to only take liquids until Saturday -6/18/202.  Do you have support at home?  Not addressed  Home Care and Equipment/Supplies: Were home health services ordered? no If so, what is the name of the agency? N/a  Has the agency set up a time to come to the patient's home? not applicable Were any new equipment or medical supplies ordered?  Yes: 3:1 commode What is the name of the medical supply agency? [employer] Were you able to get the supplies/equipment? yes Do you have any questions related to the use of the equipment or supplies? No  Functional Questionnaire: (I = Independent and D = Dependent) ADLs: independent    Follow up appointments reviewed:  PCP Hospital f/u appt confirmed? Yes  Scheduled to see Dr Wynetta Emery  on 03/21/2021 @ 1050. [facility] f/u appt confirmed? No   Are transportation arrangements needed? No  - he takes the bus If their condition worsens, is the pt aware to call PCP or go to the Emergency Dept.? Yes Was the patient provided with contact information for the PCP's office or ED? Yes Was to pt encouraged to call back  with questions or concerns? Yes

## 2021-03-15 NOTE — Discharge Summary (Signed)
[facility] Discharge Summary  Patient name: Brent Rogers Medical record number: [id number] Date of birth: [birth year] Age: 55 y.o. Gender: male Date of Admission: 03/05/2021  Date of Discharge: 03/13/21 Admitting Physician: Kinnie Feil, MD  Primary Care Provider: Ladell Pier, MD Consultants: GI, Cards, ENT  Indication for Hospitalization: Nausea/Vomiting  Discharge Diagnoses/Problem List:  PMHx significant for chronic pancreatitis, Whipple's procedure, HTN, Migraine, Scoliosis, fatty liver disease, iron deficiecy anemia.  Disposition: Able to be discharged home safely  Discharge Condition: Stable  Discharge Exam:   Constitutional:      General: He is not in acute distress. HENT:    Head: Normocephalic and atraumatic. Cardiovascular:    Rate and Rhythm: Normal rate and regular rhythm. Pulmonary:    Effort: Pulmonary effort is normal.    Breath sounds: Normal breath sounds. Abdominal:    General: Bowel sounds are increased.    Palpations: Abdomen is soft.    Tenderness: There is abdominal tenderness in the left upper quadrant and left lower quadrant. Skin:    General: Skin is warm. Neurological:    Mental Status: He is alert. Psychiatric:        Mood and Affect: Mood normal.        Behavior: Behavior normal.  Brief Hospital Course:  Brent Rogers is a 55 y.o. M presented with acute abdominal pain . PMHx significant for chronic pancreatitis, Whipple's procedure, HTN, Migraine, Scoliosis, fatty liver disease, iron deficiecy anemia.  Acute on chronic Abdominal pain I Hx of chronic pancreatitis s/p Whipple's (2015) Patient presented to ED with epigastric and left upper abdominal pain, nausea, vomiting and diarrhea x 5 days.  Had previous similar episodes, but this episode worse.  LUQ and LLQ tender to palpation on exam. CT Abdomen and Pelvis obtained and had no acute findings.  GI consulted given patient's history and opted for  EGD/Colonoscopy after being cleared by Cardiology for Bradycardia.  Colonoscopy obtained and reveled mild, non-bleeding diverticulosis.  (See EGD below in dysphagia).  Abomnal pain improved on discharge.  Patient diet advanced and able to be discharged with GI and ENT clearance.  Return precautions given to patient.      Bradycardia Patient remained asymptomatic while in hospital.  Unsure if baseline or new symptoms.  Cardiology consulted prior to procedure.  Indicated no sign of AV block.  Had Cardiac CTA CAD RADS 2 that revealed no obstructive CAD tightest lesions in LAD/IM 25-49%.  Cleared for EGD and Colonoscopy.      Dysphagia Patient complained of this over past few months in addition to abdominal pain.  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  Normal Whipple revealed with friable bleeding that was treated with monopolar probe and clip placement.  Some trauma to posterior Pharyngeal region observed and ENT consulted.  Kept NPO, given 1 dose of IV Clinda and ENT consulted.  Obtained X-ray and repeat X-ray following day that revealed no perforation.  Dysphagia resolved following procedure.  Diet advanced to full solid diet, which patient tolerated prior to discharge.  Syncope Episode occurred at home after frequent vomiting.  No findings indicative of concussion.  TSH, A1C, ECHO obtained and found to be normal.  Had no repeat episodes while hospitalized.  Thought to be due to fluid losses from frequent vomiting.   Iron Deficiency Anemia Thought to be likely due to friable jejunal mucosa  Patient remained stable following procedure and had multiple bowel movements with no melena or hematochezia.  Hgb  readings had some fluctuations though these thought to be due to lab errors by our team and GI as no clinical findings supportive of this.  Depression/Anxiety Patient Buproprion discontinued given BMI and Vomiting, and concern for possible seizures.  All other issues chronic and  stable  Issues for follow-up Chronic Pancreatitis-  Follow-up with GI as needed.  PCP follow-up week following discharge. EGD/Colonoscopy- Repeat colonoscopy in 10 years.  Ensure patient remains stab Iron Deficiency Anemia- Continue oral iron supplementation.  Recommend repeat CBC at PCP follow-up. Depresion/Anxiety- Buproprion stopped.  Consider starting patient on SSRI.       Significant Procedures: EGD, Colonoscopy  Significant Labs and Imaging:  Recent Labs  Lab 03/12/21 0113 03/12/21 1431 03/13/21 0205  WBC 13.7* 10.2 7.1  HGB 9.0* 11.1* 8.4*  HCT 27.9* 35.5* 26.8*  PLT 170 174 141*   Recent Labs  Lab 03/09/21 0436 03/10/21 0146 03/11/21 1451 03/12/21 0113 03/13/21 0205  NA 135 134*  --  131* 136  K 3.8 4.0  --  3.8 3.8  CL 104 104  --  102 106  CO2 24 27  --  24 23  GLUCOSE 103* 99  --  86 140*  BUN <5* <5*  --  <5* 5*  CREATININE 0.75 0.87  --  0.77 0.85  CALCIUM 8.0* 8.2*  --  8.2* 8.2*  MG 1.5* 1.6* 1.6* 1.8  --     EXAM: CT ABDOMEN AND PELVIS WITH CONTRAST   TECHNIQUE: Multidetector CT imaging of the abdomen and pelvis was performed using the standard protocol following bolus administration of intravenous contrast.   CONTRAST:  59mL OMNIPAQUE IOHEXOL 300 MG/ML  SOLN   COMPARISON:  01/19/2021   FINDINGS: Lower chest: Lung bases are clear. No effusions. Heart is normal size.   Hepatobiliary: No focal liver abnormality is seen. Status post cholecystectomy. No biliary dilatation.   Pancreas: No focal abnormality or ductal dilatation. Prior Whipple procedure.   Spleen: No focal abnormality.  Normal size.   Adrenals/Urinary Tract: Few scattered cysts bilaterally. No hydronephrosis or adrenal glands and urinary bladder unremarkable.   Stomach/Bowel: Stomach, large and small bowel grossly unremarkable. Postoperative changes from prior Whipple procedure. Gastrojejunostomy unremarkable.   Vascular/Lymphatic: Aortic atherosclerosis. No  evidence of aneurysm or adenopathy.   Reproductive: No visible focal abnormality.   Other: No free fluid or free air.   Musculoskeletal: No acute bony abnormality.   IMPRESSION: No acute findings in the abdomen or pelvis.   Prior Whipple procedure.  No complicating feature.   Aortic atherosclerosis.  EXAM: Cardiac CTA   MEDICATIONS: Sub lingual nitro. 4 mg   TECHNIQUE: The patient was scanned on a [employer] 192 scanner. Gantry rotation speed was 250 msecs. Collimation was. 6 mm . A 120 kV prospective scan was triggered in the ascending thoracic aorta at 140 HU's with full mA between 30-70% of the R-R interval . Average HR during the scan was bpm. The 3D data set was interpreted on a dedicated work station using MPR, MIP and VRT modes. A total of 80 cc of contrast was used.   FINDINGS: Non-cardiac: See separate report from [facility]. No significant findings on limited lung and soft tissue windows.   Calcium score: Calcium noted in RCA and LAD   Coronary Arteries: Right dominant with no anomalies   LM: Normal   LAD: 25-49% tri furcation lesion in ostial LAD distal LM and take off of large IM 1-24% calcified plaque in proximal vessel   IM:  25-49% proximal calcified plaque in proximal vessel 1-24% calcified plaque in mid vessel   D1: Normal   Circumflex: Normal   OM1: Normal   AV Groove Normal   RCA: 1-24% calcified plaque in proximal RCA   PDA:   PLA:   IMPRESSION: 1. Calcium score 296 involving the RCA and LAD this is 63 th percentile for age and sex   2.  Normal  aortic root 3.7 cm   3.  CAD RADS 2 non obstructive CAD See description above   [name]     Electronically Signed   By: [name] M.D.   On: 03/09/2021 09:01    EXAM: CHEST - 2 VIEW   COMPARISON:  Prior chest radiographs 03/11/2021 and earlier.   FINDINGS: Heart size within normal limits. No appreciable airspace consolidation. No appreciable  pneumothorax or pneumomediastinum. No evidence of pleural effusion. No acute bony abnormality identified.   IMPRESSION: No evidence of acute cardiopulmonary abnormality. No evidence of pneumothorax or pneumomediastinum.     Electronically Signed   By: Kellie Simmering DO   On: 03/12/2021 08:13   Results/Tests Pending at Time of Discharge: None  Discharge Medications:  Allergies as of 03/13/2021       Reactions   No Known Allergies         Medication List     STOP taking these medications    dicyclomine 20 MG tablet Commonly known as: BENTYL   ondansetron 4 MG disintegrating tablet Commonly known as: Zofran ODT       TAKE these medications    Creon 24000-76000 units Cpep Generic drug: Pancrelipase (Lip-Prot-Amyl) Take 1 capsule (24,000 Units total) by mouth with breakfast, with lunch, and with evening meal.   ferrous sulfate 325 (65 FE) MG tablet Take 1 tablet (325 mg total) by mouth daily.   pantoprazole 40 MG tablet Commonly known as: PROTONIX TAKE 1 TABLET (40 MG TOTAL) BY MOUTH DAILY.   promethazine 25 MG suppository Commonly known as: PHENERGAN PLACE 1 SUPPOSITORY (25 MG TOTAL) RECTALLY EVERY 6 (SIX) HOURS AS NEEDED FOR NAUSEA OR VOMITING.   traMADol 50 MG tablet Commonly known as: ULTRAM Take 1 tablet (50 mg total) by mouth every 12 (twelve) hours as needed for up to 5 days for moderate pain or severe pain.        Discharge Instructions: Please refer to Patient Instructions section of EMR for full details.  Patient was counseled important signs and symptoms that should prompt return to medical care, changes in medications, dietary instructions, activity restrictions, and follow up appointments.   Follow-Up Appointments:  Follow-up Information     Ladell Pier, MD. Schedule an appointment as soon as possible for a visit in 1 week(s).   Specialty: Internal Medicine Contact information: [place]  [address] [phone]                 Delora Fuel, MD 03/15/2021, 10:27 PM PGY-1, [place]

## 2021-03-15 NOTE — Telephone Encounter (Signed)
Pt was previously under the care of Dr. Chapman Fitch. Dr. Wynetta Emery hasn't established with him yet and it's been >6 months since last OV. I think he may need to wait until being seen next week. However, I will forward to Dr. Wynetta Emery to see if she wishes to refill anything now.

## 2021-03-16 MED ORDER — CREON 24000-76000 UNITS PO CPEP
1.0000 | ORAL_CAPSULE | Freq: Three times a day (TID) | ORAL | 1 refills | Status: DC
Start: 2021-03-16 — End: 2021-09-28
  Filled 2021-03-16: qty 200, 67d supply, fill #0

## 2021-03-16 MED ORDER — PANTOPRAZOLE SODIUM 40 MG PO TBEC
40.0000 mg | DELAYED_RELEASE_TABLET | Freq: Every day | ORAL | 3 refills | Status: DC
  Filled 2021-03-16: qty 30, 30d supply, fill #0
  Filled 2021-05-01: qty 30, 30d supply, fill #1
  Filled 2021-07-11 – 2021-07-23 (×2): qty 30, 30d supply, fill #2

## 2021-03-16 MED ORDER — PROMETHAZINE HCL 25 MG RE SUPP
RECTAL | 0 refills | Status: DC
  Filled 2021-03-16: qty 12, 3d supply, fill #0

## 2021-03-16 MED ORDER — FERROUS SULFATE 325 (65 FE) MG PO TABS
325.0000 mg | ORAL_TABLET | Freq: Every day | ORAL | 1 refills | Status: DC
  Filled 2021-03-16: qty 30, 30d supply, fill #0

## 2021-03-16 NOTE — Addendum Note (Signed)
Addended by: Karle Plumber B on: 03/16/2021 04:34 PM   Modules accepted: Orders

## 2021-03-18 ENCOUNTER — Telehealth: Payer: Self-pay

## 2021-03-18 ENCOUNTER — Other Ambulatory Visit: Payer: Self-pay

## 2021-03-18 NOTE — Telephone Encounter (Signed)
Call placed to patient and informed him that Dr Wynetta Emery sent refills for all of his medications except the tramadol to [facility].  He said okay. Reminded him that he has an appointment with Dr Wynetta Emery this week - 03/21/2021 @ 1050.

## 2021-03-19 ENCOUNTER — Other Ambulatory Visit: Payer: Self-pay

## 2021-03-21 ENCOUNTER — Encounter: Payer: Self-pay | Admitting: Internal Medicine

## 2021-03-21 ENCOUNTER — Other Ambulatory Visit: Payer: Self-pay

## 2021-03-21 ENCOUNTER — Ambulatory Visit: Payer: Medicaid Other | Attending: Internal Medicine | Admitting: Internal Medicine

## 2021-03-21 VITALS — BP 135/101 | HR 91 | Resp 16 | Wt 132.0 lb

## 2021-03-21 DIAGNOSIS — K8681 Exocrine pancreatic insufficiency: Secondary | ICD-10-CM | POA: Insufficient documentation

## 2021-03-21 DIAGNOSIS — F419 Anxiety disorder, unspecified: Secondary | ICD-10-CM

## 2021-03-21 DIAGNOSIS — G894 Chronic pain syndrome: Secondary | ICD-10-CM | POA: Diagnosis not present

## 2021-03-21 DIAGNOSIS — Z90411 Acquired partial absence of pancreas: Secondary | ICD-10-CM | POA: Diagnosis not present

## 2021-03-21 DIAGNOSIS — K861 Other chronic pancreatitis: Secondary | ICD-10-CM | POA: Insufficient documentation

## 2021-03-21 DIAGNOSIS — I10 Essential (primary) hypertension: Secondary | ICD-10-CM | POA: Diagnosis not present

## 2021-03-21 DIAGNOSIS — Z9049 Acquired absence of other specified parts of digestive tract: Secondary | ICD-10-CM | POA: Insufficient documentation

## 2021-03-21 DIAGNOSIS — D500 Iron deficiency anemia secondary to blood loss (chronic): Secondary | ICD-10-CM | POA: Diagnosis not present

## 2021-03-21 DIAGNOSIS — R1319 Other dysphagia: Secondary | ICD-10-CM | POA: Diagnosis present

## 2021-03-21 DIAGNOSIS — Z79899 Other long term (current) drug therapy: Secondary | ICD-10-CM | POA: Diagnosis not present

## 2021-03-21 DIAGNOSIS — F17210 Nicotine dependence, cigarettes, uncomplicated: Secondary | ICD-10-CM

## 2021-03-21 DIAGNOSIS — Z8249 Family history of ischemic heart disease and other diseases of the circulatory system: Secondary | ICD-10-CM | POA: Diagnosis not present

## 2021-03-21 DIAGNOSIS — F331 Major depressive disorder, recurrent, moderate: Secondary | ICD-10-CM | POA: Diagnosis not present

## 2021-03-21 DIAGNOSIS — Z681 Body mass index (BMI) 19 or less, adult: Secondary | ICD-10-CM | POA: Insufficient documentation

## 2021-03-21 DIAGNOSIS — E43 Unspecified severe protein-calorie malnutrition: Secondary | ICD-10-CM

## 2021-03-21 DIAGNOSIS — Z09 Encounter for follow-up examination after completed treatment for conditions other than malignant neoplasm: Secondary | ICD-10-CM

## 2021-03-21 DIAGNOSIS — I2510 Atherosclerotic heart disease of native coronary artery without angina pectoris: Secondary | ICD-10-CM | POA: Diagnosis not present

## 2021-03-21 DIAGNOSIS — R1012 Left upper quadrant pain: Secondary | ICD-10-CM

## 2021-03-21 DIAGNOSIS — G8929 Other chronic pain: Secondary | ICD-10-CM

## 2021-03-21 MED ORDER — AMLODIPINE BESYLATE 5 MG PO TABS
5.0000 mg | ORAL_TABLET | Freq: Every day | ORAL | 2 refills | Status: DC
  Filled 2021-03-21 – 2021-05-01 (×2): qty 90, 90d supply, fill #0

## 2021-03-21 MED ORDER — DULOXETINE HCL 20 MG PO CPEP
20.0000 mg | ORAL_CAPSULE | Freq: Every day | ORAL | 3 refills | Status: DC
  Filled 2021-03-21 – 2021-05-01 (×2): qty 30, 30d supply, fill #0
  Filled 2021-06-04 – 2021-06-18 (×2): qty 30, 30d supply, fill #1

## 2021-03-21 NOTE — Progress Notes (Signed)
Patient ID: Brent Rogers, male    DOB: 19-Oct-1965  MRN: [id number]  CC: Transition of care visit. Date of hospitalization: 6/7-15/2022 Date of call from case worker: 03/14/2021   Subjective: Brent Rogers is a 55 y.o. male who presents for hosp f/u/TOC visit His concerns today include:  Pt with history of chronic pancreatitis s/p benign pancreatic tumor excision with Whipple procedure, fhx of pancreatic cancer in father,IDA, HTN, GERD, tob dep, depression.  Patient was hospitalized with acute on chronic abdominal pain of the left upper quadrant.  CT abdomen and pelvis revealed no acute findings.  EGD revealed benign-appearing esophageal stenosis, bleeding friable jejunal mucosa treated with monopolar probe and clips were placed.  Some trauma to the posterior pharyngeal region observed and ENT was consulted.  X-ray revealed no perforation.  Dysphagia resolved following procedure.  Diet was able to be advanced.  Colonoscopy revealed mild nonbleeding diverticulosis.  Albumin level low at 2.6. Patient had reported syncope after frequent vomiting at home.  Noted to have bradycardia with no sign of AV block.  Cardiac CTA done revealed nonobstructive CAD lesion in the LAD/IM 25 to 49%.  Echo normal.  Syncope thought to be due to fluid losses from vomiting.  Patient was continued on iron supplement for anemia.  Bupropion was discontinued as there was some concern for seizures.  Today: Patient reports he is still having pain in the left upper quadrant of the abdomen when he eats.  He also feels that food gets hung in the mid chest when he swallows.  He has not had any vomiting or diarrhea.  He is taking Creon as prescribed.  Compliant with taking iron for his anemia.  Appetite is poor.  He has loss 18 pounds since March of this year.  HTN: Blood pressure elevated.  He is needing refill on Norvasc.  He is still smoking.  1 pack would last a few weeks.  States he is trying to quit.  Sent home with  patches but not using them consistently.  Admits to issues with depression and anxiety.  He attributes this to being chronically ill and not being able to do the things he used to do.  He was on bupropion but this was discontinued from the hospital.  He feels he would benefit from being on medication.  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   Chronic pain syndrome 06/07/2019   Depression 06/07/2019   Chronic pancreatitis ([place]) 05/01/2017   Foot callus 12/25/2015   Onychomycosis of toenail 12/25/2015   Fatty liver 11/12/2015   GERD (gastroesophageal reflux disease) 11/12/2015   HTN (hypertension) 07/10/2015   S/P cholecystectomy 07/10/2015   Tobacco abuse 12/26/2014   Protein-calorie malnutrition, severe ([place]) 11/20/2014   Recurrent pancreatitis 06/06/2014   Exocrine pancreatic insufficiency ([place]) 12/23/2013     Current Outpatient Medications on File Prior to Visit  Medication Sig Dispense Refill   ferrous sulfate 325 (65 FE) MG tablet Take 1 tablet (325 mg total) by mouth daily. 100 tablet 1   Pancrelipase, Lip-Prot-Amyl, (CREON) 24000-76000 units CPEP Take 1 capsule (24,000 Units total) by mouth with breakfast, with  lunch, and with evening meal. 270 capsule 1   pantoprazole (PROTONIX) 40 MG tablet Take 1 tablet (40 mg total) by mouth daily. 30 tablet 3   promethazine (PHENERGAN) 25 MG suppository PLACE 1 SUPPOSITORY (25 MG TOTAL) RECTALLY EVERY 6 (SIX) HOURS AS NEEDED FOR NAUSEA OR VOMITING. 12 suppository 0   No current facility-administered medications on file prior to visit.    Allergies  Allergen Reactions   No Known Allergies      Social History   Socioeconomic History   Marital status: Divorced    Spouse name: Not on file   Number of children: Not on file   Years of education: Not on file   Highest education level: Not on file  Occupational History   Not on file  Tobacco Use   Smoking status: Every Day    Packs/day: 0.25    Years: 30.00    Pack years: 7.50    Types: Cigarettes   Smokeless tobacco: Former    Types: Snuff   Tobacco comments:    "used snuff 10-15 years in my 20s-30s"  Vaping Use   Vaping Use: Never used  Substance and Sexual Activity   Alcohol use: Yes    Alcohol/week: 4.0 standard drinks    Types: 4 Cans of beer per week    Comment: weekly   Drug use: No   Sexual activity: Not Currently    Partners: Male  Other Topics Concern   Not on file  Social History Narrative   Not on file   Social Determinants of Health   Financial Resource Strain: Not on file  Food Insecurity: Not on file  Transportation Needs: Not on file  Physical Activity: Not on file  Stress: Not on file  Social Connections: Not on file  Intimate Partner Violence: Not on file    Family History  Problem Relation Age of Onset   Cancer Mother        unsure   COPD Mother    Hypertension Mother    Hypertension Father    COPD Father    CAD Sister        possible has stent per pt    Past Surgical History:  Procedure Laterality Date   BACK SURGERY     COLONOSCOPY WITH PROPOFOL N/A 03/11/2021   Procedure: COLONOSCOPY WITH PROPOFOL;  Surgeon: Carol Ada, MD;  Location: [place];  Service: Endoscopy;  Laterality: N/A;   ESOPHAGOGASTRODUODENOSCOPY (EGD) WITH PROPOFOL N/A 03/11/2021   Procedure: ESOPHAGOGASTRODUODENOSCOPY (EGD) WITH PROPOFOL;  Surgeon: Carol Ada, MD;  Location: [place];  Service: Endoscopy;  Laterality: N/A;   EUS N/A 09/21/2013   Procedure: ESOPHAGEAL ENDOSCOPIC ULTRASOUND (EUS) RADIAL;  Surgeon: Beryle Beams, MD;  [facility];  Service: Endoscopy;  Laterality:  N/A;   EUS N/A 09/30/2013   Procedure: UPPER ENDOSCOPIC ULTRASOUND (EUS) LINEAR;  Surgeon: Beryle Beams, MD;  [facility];  Service: Endoscopy;  Laterality: N/A;   FINE NEEDLE ASPIRATION N/A 09/21/2013   Procedure: FINE NEEDLE ASPIRATION (FNA) LINEAR;  Surgeon: Beryle Beams, MD;  [facility];  Service: Endoscopy;  Laterality: N/A;   FINGER FRACTURE SURGERY Left 1995   5th digit   FRACTURE SURGERY     HEMOSTASIS CLIP PLACEMENT  03/11/2021   Procedure: HEMOSTASIS CLIP PLACEMENT;  Surgeon: Carol Ada, MD;  Location: [place];  Service: Endoscopy;;   HEMOSTASIS CONTROL  03/11/2021   Procedure: HEMOSTASIS CONTROL;  Surgeon: Carol Ada, MD;  Location: [place];  Service: Endoscopy;;  KNEE ARTHROSCOPY Bilateral 1987-1989   right-left   LAPAROSCOPY N/A 12/01/2013   Procedure: LAPAROSCOPY DIAGNOSTIC PANCREATICODUODENECTOMY WITH BILIARY AND PANCREATIC STENTS;  Surgeon: Stark Klein, MD;  Location[facility];  Service: General;  Laterality: N/A;   LUMBAR [place] SURGERY  1990's   SAVORY DILATION N/A 03/11/2021   Procedure: SAVORY DILATION;  Surgeon: Carol Ada, MD;  Location: [place];  Service: Endoscopy;  Laterality: N/A;   WHIPPLE PROCEDURE  12/01/2013    ROS: Review of Systems Negative except as stated above  PHYSICAL EXAM: BP (!) 135/101   Pulse 91   Resp 16   Wt 132 lb (59.9 kg)   SpO2 100%   BMI 16.07 kg/m   Wt Readings from Last 3 Encounters:  03/21/21 132 lb (59.9 kg)  03/06/21 133 lb 13.1 oz (60.7 kg)  02/02/21 155 lb (70.3 kg)    Physical Exam   General appearance - alert, middle-aged African-American male who appears chronically ill and underweight.   Mental status - normal mood, behavior, speech, dress, motor activity, and thought processes Mouth - mucous membranes moist, pharynx normal without lesions Neck - supple, no significant adenopathy Chest - clear to auscultation, no wheezes, rales or rhonchi, symmetric air entry Heart -  normal rate, regular rhythm, normal S1, S2, no murmurs, rubs, clicks or gallops Abdomen - soft, nontender, nondistended, no masses or organomegaly Extremities - peripheral pulses normal, no pedal edema, no clubbing or cyanosis  Depression screen [facility] 2/9 03/21/2021 06/22/2020 11/17/2019  Decreased Interest 2 1 0  Down, Depressed, Hopeless 2 0 1  PHQ - 2 Score 4 1 1   Altered sleeping 2 3 1   Tired, decreased energy 3 3 0  Change in appetite 3 1 0  Feeling bad or failure about yourself  1 0 0  Trouble concentrating 1 0 0  Moving slowly or fidgety/restless 2 0 0  Suicidal thoughts 0 0 0  PHQ-9 Score 16 8 2   Difficult doing work/chores - Not difficult at all -  Some recent data might be hidden   GAD 7 : Generalized Anxiety Score 03/21/2021 06/22/2020 11/17/2019 06/07/2019  Nervous, Anxious, on Edge 2 1 0 2  Control/stop worrying 1 1 0 3  Worry too much - different things 2 1 0 3  Trouble relaxing 2 1 3 3   Restless 1 1 0 2  Easily annoyed or irritable 1 0 0 3  Afraid - awful might happen 0 0 0 2  Total GAD 7 Score 9 5 3 18   Anxiety Difficulty - Not difficult at all - Extremely difficult     CMP Latest Ref Rng & Units 03/13/2021 03/12/2021 03/10/2021  Glucose 70 - 99 mg/dL 140(H) 86 99  BUN 6 - 20 mg/dL 5(L) <5(L) <5(L)  Creatinine 0.61 - 1.24 mg/dL 0.85 0.77 0.87  Sodium 135 - 145 mmol/L 136 131(L) 134(L)  Potassium 3.5 - 5.1 mmol/L 3.8 3.8 4.0  Chloride 98 - 111 mmol/L 106 102 104  CO2 22 - 32 mmol/L 23 24 27   Calcium 8.9 - 10.3 mg/dL 8.2(L) 8.2(L) 8.2(L)  Total Protein 6.5 - 8.1 g/dL - - -  Total Bilirubin 0.3 - 1.2 mg/dL - - -  Alkaline Phos 38 - 126 U/L - - -  AST 15 - 41 U/L - - -  ALT 0 - 44 U/L - - -   Lipid Panel     Component Value Date/Time   CHOL 123 03/06/2021 2032   TRIG 55 03/06/2021 2032   HDL  64 03/06/2021 2032   CHOLHDL 1.9 03/06/2021 2032   VLDL 11 03/06/2021 2032   LDLCALC 48 03/06/2021 2032    CBC    Component Value Date/Time   WBC 10.0 03/21/2021 1115    WBC 7.1 03/13/2021 0205   RBC 4.04 (L) 03/21/2021 1115   RBC 3.15 (L) 03/13/2021 0205   HGB 10.9 (L) 03/21/2021 1115   HCT 32.6 (L) 03/21/2021 1115   PLT 295 03/21/2021 1115   MCV 81 03/21/2021 1115   MCH 27.0 03/21/2021 1115   MCH 26.7 03/13/2021 0205   MCHC 33.4 03/21/2021 1115   MCHC 31.3 03/13/2021 0205   RDW 17.9 (H) 03/21/2021 1115   LYMPHSABS 1.9 03/07/2021 0139   MONOABS 0.8 03/07/2021 0139   EOSABS 0.1 03/07/2021 0139   BASOSABS 0.1 03/07/2021 0139    ASSESSMENT AND PLAN: 1. Hospital discharge follow-up   2. Abdominal pain, chronic, left upper quadrant I will have him follow-up with Dr. Benson Norway who did his procedures. - Ambulatory referral to Gastroenterology  3. Esophageal dysphagia - DG ESOPHAGUS W SINGLE CM (SOL OR THIN BA); Future - Ambulatory referral to Gastroenterology  4. Protein-calorie malnutrition, severe ([place]) Recommend supplementing meals with Ensure shakes.  Given a handwritten prescription to see whether his insurance will cover the cost of him getting insurance. - Ambulatory referral to Gastroenterology  5. Iron deficiency anemia due to chronic blood loss Continue iron supplement. - CBC  6. Essential hypertension Uncontrolled.  He has been off amlodipine.  Refill given. - amLODipine (NORVASC) 5 MG tablet; Take 1 tablet (5 mg total) by mouth daily.  Dispense: 90 tablet; Refill: 2  7. Light smoker Advised to quit.  Discussed health risks associated with smoking.  He has not quite made up his mind to quit but has cut down.  8. Major depressive disorder, recurrent episode, moderate ([place]) Discussed putting him on Cymbalta to help with depression, anxiety and chronic pain.  Patient is agreeable to this.  He denies any suicidal ideation at this time. - DULoxetine (CYMBALTA) 20 MG capsule; Take 1 capsule (20 mg total) by mouth daily.  Dispense: 30 capsule; Refill: 3  9. Anxiety disorder, unspecified type - DULoxetine (CYMBALTA) 20 MG capsule; Take 1  capsule (20 mg total) by mouth daily.  Dispense: 30 capsule; Refill: 3  10. Atherosclerosis of native coronary artery of native heart without angina pectoris Question of putting him on low-dose statin.  However lipid profile was very normal.  Smoking cessation encouraged.   Patient was given the opportunity to ask questions.  Patient verbalized understanding of the plan and was able to repeat key elements of the plan.   Orders Placed This Encounter  Procedures   DG ESOPHAGUS W SINGLE CM (SOL OR THIN BA)   CBC   Ambulatory referral to Gastroenterology     Requested Prescriptions   Signed Prescriptions Disp Refills   amLODipine (NORVASC) 5 MG tablet 90 tablet 2    Sig: Take 1 tablet (5 mg total) by mouth daily.   DULoxetine (CYMBALTA) 20 MG capsule 30 capsule 3    Sig: Take 1 capsule (20 mg total) by mouth daily.    Return in about 2 months (around 05/21/2021) for Give appt with [facility] in 1 wk for BP check.  Karle Plumber, MD, FACP

## 2021-03-21 NOTE — Progress Notes (Signed)
Pt states he is also having pain in his throat

## 2021-03-21 NOTE — Patient Instructions (Signed)
Your blood pressure is not at goal.  Start amlodipine 5 mg daily.  Try drinking Ensure to help supplement your meals.  I have referred you for barium swallow study.  I have submitted a referral for you to follow-up with the gastroenterologist Dr. Benson Norway.  Start Cymbalta as discussed today.  This medication will help with depression/anxiety and chronic pain.

## 2021-03-22 LAB — CBC
Hematocrit: 32.6 % — ABNORMAL LOW (ref 37.5–51.0)
Hemoglobin: 10.9 g/dL — ABNORMAL LOW (ref 13.0–17.7)
MCH: 27 pg (ref 26.6–33.0)
MCHC: 33.4 g/dL (ref 31.5–35.7)
MCV: 81 fL (ref 79–97)
Platelets: 295 10*3/uL (ref 150–450)
RBC: 4.04 x10E6/uL — ABNORMAL LOW (ref 4.14–5.80)
RDW: 17.9 % — ABNORMAL HIGH (ref 11.6–15.4)
WBC: 10 10*3/uL (ref 3.4–10.8)

## 2021-03-24 DIAGNOSIS — F331 Major depressive disorder, recurrent, moderate: Secondary | ICD-10-CM | POA: Insufficient documentation

## 2021-03-27 ENCOUNTER — Other Ambulatory Visit: Payer: Self-pay

## 2021-03-27 ENCOUNTER — Inpatient Hospital Stay (HOSPITAL_COMMUNITY): Admission: RE | Admit: 2021-03-27 | Payer: Medicaid Other | Source: Ambulatory Visit

## 2021-03-28 ENCOUNTER — Telehealth: Payer: Self-pay

## 2021-03-28 ENCOUNTER — Ambulatory Visit: Payer: Medicaid Other | Admitting: Pharmacist

## 2021-03-28 ENCOUNTER — Other Ambulatory Visit: Payer: Self-pay

## 2021-03-28 NOTE — Telephone Encounter (Signed)
Contacted pt to go over lab results pt is aware and doesn't have any questions or concerns 

## 2021-04-04 ENCOUNTER — Ambulatory Visit: Payer: Medicaid Other | Admitting: Pharmacist

## 2021-05-01 ENCOUNTER — Other Ambulatory Visit: Payer: Self-pay | Admitting: Internal Medicine

## 2021-05-01 ENCOUNTER — Other Ambulatory Visit: Payer: Self-pay

## 2021-05-01 NOTE — Telephone Encounter (Signed)
Requested medications are due for refill today.  yes  Requested medications are on the active medications list.  yes  Last refill. 03/16/2021  Future visit scheduled.   yes  Notes to clinic.  Medication not delegated.

## 2021-05-02 ENCOUNTER — Other Ambulatory Visit: Payer: Self-pay

## 2021-05-02 MED ORDER — PROMETHAZINE HCL 25 MG RE SUPP
RECTAL | 0 refills | Status: DC
  Filled 2021-05-02 – 2021-06-04 (×2): qty 12, 3d supply, fill #0

## 2021-05-03 ENCOUNTER — Other Ambulatory Visit: Payer: Self-pay

## 2021-05-06 ENCOUNTER — Other Ambulatory Visit: Payer: Self-pay

## 2021-05-07 ENCOUNTER — Other Ambulatory Visit: Payer: Self-pay

## 2021-05-14 ENCOUNTER — Other Ambulatory Visit: Payer: Self-pay

## 2021-05-15 ENCOUNTER — Other Ambulatory Visit: Payer: Self-pay

## 2021-05-21 ENCOUNTER — Ambulatory Visit: Payer: Medicaid Other | Admitting: Internal Medicine

## 2021-05-22 ENCOUNTER — Ambulatory Visit: Payer: Medicaid Other | Attending: Internal Medicine | Admitting: Physician Assistant

## 2021-05-22 ENCOUNTER — Other Ambulatory Visit: Payer: Self-pay

## 2021-06-02 ENCOUNTER — Encounter (HOSPITAL_COMMUNITY): Payer: Self-pay

## 2021-06-02 ENCOUNTER — Other Ambulatory Visit: Payer: Self-pay

## 2021-06-02 ENCOUNTER — Emergency Department (HOSPITAL_COMMUNITY)
Admission: EM | Admit: 2021-06-02 | Discharge: 2021-06-03 | Disposition: A | Discharge status: Home / Self Care | Payer: Medicaid Other | Attending: Emergency Medicine | Admitting: Emergency Medicine

## 2021-06-02 DIAGNOSIS — F17210 Nicotine dependence, cigarettes, uncomplicated: Secondary | ICD-10-CM | POA: Diagnosis not present

## 2021-06-02 DIAGNOSIS — K2900 Acute gastritis without bleeding: Secondary | ICD-10-CM | POA: Insufficient documentation

## 2021-06-02 DIAGNOSIS — K2970 Gastritis, unspecified, without bleeding: Secondary | ICD-10-CM

## 2021-06-02 DIAGNOSIS — I10 Essential (primary) hypertension: Secondary | ICD-10-CM | POA: Diagnosis not present

## 2021-06-02 DIAGNOSIS — R1084 Generalized abdominal pain: Secondary | ICD-10-CM | POA: Diagnosis present

## 2021-06-02 NOTE — ED Triage Notes (Signed)
Pt BIB EMS. Pt complains of nausea, diarrhea, and abdominal pain for the past 4 days.

## 2021-06-03 ENCOUNTER — Emergency Department (HOSPITAL_COMMUNITY): Payer: Medicaid Other

## 2021-06-03 LAB — COMPREHENSIVE METABOLIC PANEL
ALT: 33 U/L (ref 0–44)
AST: 73 U/L — ABNORMAL HIGH (ref 15–41)
Albumin: 3.3 g/dL — ABNORMAL LOW (ref 3.5–5.0)
Alkaline Phosphatase: 119 U/L (ref 38–126)
Anion gap: 11 (ref 5–15)
BUN: <5 mg/dL — ABNORMAL LOW (ref 6–20)
CO2: 23 mmol/L (ref 22–32)
Calcium: 8.9 mg/dL (ref 8.9–10.3)
Chloride: 106 mmol/L (ref 98–111)
Creatinine, Ser: 0.62 mg/dL (ref 0.61–1.24)
GFR, Estimated: >60 mL/min (ref >60)
Glucose, Bld: 102 mg/dL — ABNORMAL HIGH (ref 70–99)
Potassium: 3.3 mmol/L — ABNORMAL LOW (ref 3.5–5.1)
Sodium: 140 mmol/L (ref 135–145)
Total Bilirubin: 0.4 mg/dL (ref 0.3–1.2)
Total Protein: 6.7 g/dL (ref 6.5–8.1)

## 2021-06-03 LAB — CBC
HCT: 39.7 % (ref 39.0–52.0)
Hemoglobin: 13.1 g/dL (ref 13.0–17.0)
MCH: 29.6 pg (ref 26.0–34.0)
MCHC: 33 g/dL (ref 30.0–36.0)
MCV: 89.8 fL (ref 80.0–100.0)
Platelets: 211 10*3/uL (ref 150–400)
RBC: 4.42 MIL/uL (ref 4.22–5.81)
RDW: 18.2 % — ABNORMAL HIGH (ref 11.5–15.5)
WBC: 6.5 10*3/uL (ref 4.0–10.5)
nRBC: 0 % (ref 0.0–0.2)

## 2021-06-03 LAB — TROPONIN I (HIGH SENSITIVITY): Troponin I (High Sensitivity): 6 ng/L (ref <18)

## 2021-06-03 LAB — LACTIC ACID, PLASMA: Lactic Acid, Venous: 2.9 mmol/L (ref 0.5–1.9)

## 2021-06-03 LAB — LIPASE, BLOOD: Lipase: 18 U/L (ref 11–51)

## 2021-06-03 MED ORDER — HYDROMORPHONE HCL 1 MG/ML IJ SOLN
1.0000 mg | Freq: Once | INTRAMUSCULAR | Status: AC
  Administered 2021-06-03: 1 mg via INTRAVENOUS
  Filled 2021-06-03: qty 1

## 2021-06-03 MED ORDER — FAMOTIDINE IN NACL 20-0.9 MG/50ML-% IV SOLN
20.0000 mg | Freq: Once | INTRAVENOUS | Status: AC
  Administered 2021-06-03: 20 mg via INTRAVENOUS
  Filled 2021-06-03: qty 50

## 2021-06-03 MED ORDER — IOHEXOL 350 MG/ML SOLN
80.0000 mL | Freq: Once | INTRAVENOUS | Status: AC | PRN
  Administered 2021-06-03: 80 mL via INTRAVENOUS

## 2021-06-03 MED ORDER — FENTANYL CITRATE PF 50 MCG/ML IJ SOSY
25.0000 ug | PREFILLED_SYRINGE | Freq: Once | INTRAMUSCULAR | Status: AC
  Administered 2021-06-03: 25 ug via INTRAVENOUS
  Filled 2021-06-03: qty 1

## 2021-06-03 MED ORDER — ONDANSETRON HCL 4 MG PO TABS
4.0000 mg | ORAL_TABLET | Freq: Four times a day (QID) | ORAL | 0 refills | Status: DC
  Filled 2021-06-03 – 2021-06-18 (×2): qty 12, 3d supply, fill #0

## 2021-06-03 MED ORDER — ALUM & MAG HYDROXIDE-SIMETH 200-200-20 MG/5ML PO SUSP
30.0000 mL | Freq: Once | ORAL | Status: AC
  Administered 2021-06-03: 30 mL via ORAL
  Filled 2021-06-03: qty 30

## 2021-06-03 MED ORDER — FAMOTIDINE 20 MG PO TABS
20.0000 mg | ORAL_TABLET | Freq: Two times a day (BID) | ORAL | 0 refills | Status: DC
  Filled 2021-06-03 – 2021-06-04 (×2): qty 30, 15d supply, fill #0

## 2021-06-03 MED ORDER — ONDANSETRON HCL 4 MG/2ML IJ SOLN
4.0000 mg | Freq: Once | INTRAMUSCULAR | Status: AC
  Administered 2021-06-03: 4 mg via INTRAVENOUS
  Filled 2021-06-03: qty 2

## 2021-06-03 MED ORDER — SODIUM CHLORIDE 0.9 % IV BOLUS
1000.0000 mL | Freq: Once | INTRAVENOUS | Status: AC
  Administered 2021-06-03: 1000 mL via INTRAVENOUS

## 2021-06-03 MED ORDER — SUCRALFATE 1 GM/10ML PO SUSP
1.0000 g | Freq: Three times a day (TID) | ORAL | 0 refills | Status: DC
  Filled 2021-06-03 – 2021-06-18 (×2): qty 420, 11d supply, fill #0

## 2021-06-03 MED ORDER — HYDROMORPHONE HCL 1 MG/ML IJ SOLN
0.5000 mg | Freq: Once | INTRAMUSCULAR | Status: AC
  Administered 2021-06-03: 0.5 mg via INTRAVENOUS
  Filled 2021-06-03: qty 1

## 2021-06-03 NOTE — ED Notes (Signed)
Po fluids given per MD at this time.

## 2021-06-03 NOTE — ED Notes (Signed)
Patient transported to CT 

## 2021-06-03 NOTE — ED Provider Notes (Signed)
[facility] Emergency Department Provider Note MRN:  [id number]  Arrival date & time: 06/03/21     Chief Complaint   Abdominal Pain, Nausea, and Diarrhea   History of Present Illness   Brent Rogers is a 55 y.o. year-old male with a history of pancreatic tumor status post Whipple surgery presenting to the ED with chief complaint of abdominal pain.  Location: Diffuse abdomen Duration: 1 week Onset: Gradual Timing: Constant Description: Ache Severity: Severe Exacerbating/Alleviating Factors: None Associated Symptoms: Nausea vomiting and diarrhea Pertinent Negatives: Denies fever, no chest pain or shortness of breath  Additional History: Feels dehydrated, passed out at home after standing up  Review of Systems  A complete 10 system review of systems was obtained and all systems are negative except as noted in the HPI and PMH.   Patient's Health History    Past Medical History:  Diagnosis Date   Benign tumor of endocrine pancreas    Chronic pancreatitis ([place])    S/P Whipple   Foot ulcer with fat layer exposed ([place])    Hypertension    Migraine    "@ least once/month" (11/12/2015)   Pancreatic abnormality    CT  shows mass   Pneumonia 11/12/2015   Scoliosis     Past Surgical History:  Procedure Laterality Date   BACK SURGERY     COLONOSCOPY WITH PROPOFOL N/A 03/11/2021   Procedure: COLONOSCOPY WITH PROPOFOL;  Surgeon: Carol Ada, MD;  Location: [place];  Service: Endoscopy;  Laterality: N/A;   ESOPHAGOGASTRODUODENOSCOPY (EGD) WITH PROPOFOL N/A 03/11/2021   Procedure: ESOPHAGOGASTRODUODENOSCOPY (EGD) WITH PROPOFOL;  Surgeon: Carol Ada, MD;  Location: [place];  Service: Endoscopy;  Laterality: N/A;   EUS N/A 09/21/2013   Procedure: ESOPHAGEAL ENDOSCOPIC ULTRASOUND (EUS) RADIAL;  Surgeon: Beryle Beams, MD;  [facility];  Service: Endoscopy;  Laterality: N/A;   EUS N/A 09/30/2013   Procedure: UPPER ENDOSCOPIC ULTRASOUND  (EUS) LINEAR;  Surgeon: Beryle Beams, MD;  [facility];  Service: Endoscopy;  Laterality: N/A;   FINE NEEDLE ASPIRATION N/A 09/21/2013   Procedure: FINE NEEDLE ASPIRATION (FNA) LINEAR;  Surgeon: Beryle Beams, MD;  [facility];  Service: Endoscopy;  Laterality: N/A;   FINGER FRACTURE SURGERY Left 1995   5th digit   FRACTURE SURGERY     HEMOSTASIS CLIP PLACEMENT  03/11/2021   Procedure: HEMOSTASIS CLIP PLACEMENT;  Surgeon: Carol Ada, MD;  [facility];  Service: Endoscopy;;   HEMOSTASIS CONTROL  03/11/2021   Procedure: HEMOSTASIS CONTROL;  Surgeon: Carol Ada, MD;  Location: [place];  Service: Endoscopy;;   KNEE ARTHROSCOPY Bilateral 1987-1989   right-left   LAPAROSCOPY N/A 12/01/2013   Procedure: LAPAROSCOPY DIAGNOSTIC PANCREATICODUODENECTOMY WITH BILIARY AND PANCREATIC STENTS;  Surgeon: Stark Klein, MD;  [facility];  Service: General;  Laterality: N/A;   LUMBAR [place] SURGERY  1990's   SAVORY DILATION N/A 03/11/2021   Procedure: SAVORY DILATION;  Surgeon: Carol Ada, MD;  Location: [place];  Service: Endoscopy;  Laterality: N/A;   WHIPPLE PROCEDURE  12/01/2013    Family History  Problem Relation Age of Onset   Cancer Mother        unsure   COPD Mother    Hypertension Mother    Hypertension Father    COPD Father    CAD Sister        possible has stent per pt    Social History   Socioeconomic History   Marital status: Divorced  Spouse name: Not on file   Number of children: Not on file   Years of education: Not on file   Highest education level: Not on file  Occupational History   Not on file  Tobacco Use   Smoking status: Every Day    Packs/day: 0.25    Years: 30.00    Pack years: 7.50    Types: Cigarettes   Smokeless tobacco: Former    Types: Snuff   Tobacco comments:    "used snuff 10-15 years in my 20s-30s"  Vaping Use   Vaping Use: Never used  Substance and Sexual Activity   Alcohol use: Yes     Alcohol/week: 4.0 standard drinks    Types: 4 Cans of beer per week    Comment: weekly   Drug use: No   Sexual activity: Not Currently    Partners: Male  Other Topics Concern   Not on file  Social History Narrative   Not on file   Social Determinants of Health   Financial Resource Strain: Not on file  Food Insecurity: Not on file  Transportation Needs: Not on file  Physical Activity: Not on file  Stress: Not on file  Social Connections: Not on file  Intimate Partner Violence: Not on file     Physical Exam   Vitals:   06/03/21 0330 06/03/21 0430  BP: 121/90 (!) 117/92  Pulse: 69 81  Resp: 20 20  Temp:    SpO2: 100% 96%    CONSTITUTIONAL: Chronically ill-appearing, NAD NEURO:  Alert and oriented x 3, no focal deficits EYES:  eyes equal and reactive ENT/NECK:  no LAD, no JVD CARDIO: Regular rate, well-perfused, normal S1 and S2 PULM:  CTAB no wheezing or rhonchi GI/GU:  normal bowel sounds, non-distended, non-tender MSK/SPINE:  No gross deformities, no edema SKIN:  no rash, atraumatic PSYCH:  Appropriate speech and behavior  *Additional and/or pertinent findings included in MDM below  Diagnostic and Interventional Summary    EKG Interpretation  Date/Time:  Monday June 03 2021 02:29:46 EDT Ventricular Rate:  71 PR Interval:  156 QRS Duration: 85 QT Interval:  411 QTC Calculation: 447 R Axis:   45 Text Interpretation: Sinus rhythm Multiple premature complexes, vent & supraven RAE, consider biatrial enlargement Borderline low voltage, extremity leads Confirmed by Gerlene Fee [phone]) on 06/03/2021 4:11:26 AM       Labs Reviewed  CBC - Abnormal; Notable for the following components:      Result Value   RDW 18.2 (*)    All other components within normal limits  COMPREHENSIVE METABOLIC PANEL - Abnormal; Notable for the following components:   Potassium 3.3 (*)    Glucose, Bld 102 (*)    BUN <5 (*)    Albumin 3.3 (*)    AST 73 (*)    All other  components within normal limits  LACTIC ACID, PLASMA - Abnormal; Notable for the following components:   Lactic Acid, Venous 2.9 (*)    All other components within normal limits  LIPASE, BLOOD    CT ABDOMEN PELVIS W CONTRAST  Final Result      Medications  famotidine (PEPCID) IVPB 20 mg premix (has no administration in time range)  HYDROmorphone (DILAUDID) injection 0.5 mg (has no administration in time range)  alum & mag hydroxide-simeth (MAALOX/MYLANTA) 200-200-20 MG/5ML suspension 30 mL (has no administration in time range)  ondansetron (ZOFRAN) injection 4 mg (has no administration in time range)  sodium chloride 0.9 % bolus 1,000 mL (0  mLs Intravenous Stopped 06/03/21 0402)  ondansetron (ZOFRAN) injection 4 mg (4 mg Intravenous Given 06/03/21 0205)  fentaNYL (SUBLIMAZE) injection 25 mcg (25 mcg Intravenous Given 06/03/21 0204)  HYDROmorphone (DILAUDID) injection 1 mg (1 mg Intravenous Given 06/03/21 0358)  iohexol (OMNIPAQUE) 350 MG/ML injection 80 mL (80 mLs Intravenous Contrast Given 06/03/21 0413)     Procedures  /  Critical Care Procedures  ED Course and Medical Decision Making  I have reviewed the triage vital signs, the nursing notes, and pertinent available records from the EMR.  Listed above are laboratory and imaging tests that I personally ordered, reviewed, and interpreted and then considered in my medical decision making (see below for details).  Suspect possible gastroenteritis and dehydration, however given patient's complicated abdominal history will obtain CT imaging.     CT is reassuring, equivocal evidence for gastritis.  On reassessment patient is still having a lot of discomfort.  Pain is mostly in the left upper quadrant but he says it does radiate into the left lower chest.  EKG without obvious ischemic findings but will add on troponin and provide further pain control.  He ran out of his antiacid medications recently.  With negative troponin and improved symptoms  patient would be a candidate for discharge.  Signed out to oncoming provider at shift change.  Barth Kirks. Sedonia Small, [place] [email or web address]  Final Clinical Impressions(s) / ED Diagnoses     ICD-10-CM   1. Acute gastritis without hemorrhage, unspecified gastritis type  K29.00       ED Discharge Orders     None        Discharge Instructions Discussed with and Provided to Patient:   Discharge Instructions   None       Maudie Flakes, MD 06/03/21 0630

## 2021-06-03 NOTE — ED Notes (Signed)
Attempted PIV placement x3. Will order IV team consult at this time.

## 2021-06-03 NOTE — ED Provider Notes (Signed)
   8:49 AM Patient signed out to me by previous ED physician.   55 yo male presenting for abdominal pain and nausea. Stable laboratory studies. No signs/symptoms of sepsis. CT abdomen significant for gastritis. GI cocktail given by previous provider. Troponin pending.   8:49 AM Stable EKG and troponin. Pt ready for discharge with GI meds. Patient in no distress and overall condition improved here in the ED. Detailed discussions were had with the patient regarding current findings, and need for close f/u with PCP or on call doctor. The patient has been instructed to return immediately if the symptoms worsen in any way for re-evaluation. Patient verbalized understanding and is in agreement with current care plan. All questions answered prior to discharge.    Campbell Stall P, DO [id number] [phone]

## 2021-06-04 ENCOUNTER — Other Ambulatory Visit: Payer: Self-pay

## 2021-06-11 ENCOUNTER — Other Ambulatory Visit: Payer: Self-pay

## 2021-06-18 ENCOUNTER — Ambulatory Visit: Payer: Self-pay

## 2021-06-18 ENCOUNTER — Ambulatory Visit: Payer: Medicaid Other | Attending: Internal Medicine | Admitting: Internal Medicine

## 2021-06-18 ENCOUNTER — Other Ambulatory Visit: Payer: Self-pay

## 2021-06-18 DIAGNOSIS — K8681 Exocrine pancreatic insufficiency: Secondary | ICD-10-CM

## 2021-06-18 NOTE — Progress Notes (Signed)
Pt states he is taking Tylenol for the pain   Pt states he is having trouble keeping his weight up and keeping food down

## 2021-06-18 NOTE — Progress Notes (Signed)
Patient ID: Brent Rogers, male   DOB: Jun 27, 1966, 55 y.o.   MRN: [id number] Virtual Visit via Telephone Note  I connected with Brent Rogers on 06/18/2021 at 2:48 PM by telephone and verified that I am speaking with the correct person using two identifiers  Location: Patient: home Provider: office  Participants: Myself Patient CMA: Ms. Sallyanne Havers I discussed the limitations, risks, security and privacy concerns of performing an evaluation and management service by telephone and the availability of in person appointments. I also discussed with the patient that there may be a patient responsible charge related to this service. The patient expressed understanding and agreed to proceed.   History of Present Illness: Pt with history of chronic pancreatitis s/p benign pancreatic tumor excision with Whipple procedure, fhx of pancreatic cancer in father,IDA, HTN, atherosclerosis GERD, tob dep, depression.  This is an urgent care visit.  Patient wanting something to help him gain weight.  He is requesting to be placed on steroids.  Current weight at home is 135 pounds.  Last weight in the office 3 months ago was 132 pounds.  He reports having good appetite but within 2 hours of eating he gets diarrhea or vomiting then hurting on both sides.  This has been going on for several months.  He is taking the Creon as prescribed.  He is also taking the pantoprazole.  On last visit I referred him to the gastroenterologist Dr. Benson Norway.  He tells me that he did see him and he had ordered a study on him and he has another upcoming appointment.  He was seen in the emergency room earlier this month with abdominal pain and nausea.  CT of the abdomen revealed gastritis near the gastroenteric anastomosis.  Lipase level was normal.  He was prescribed Zofran but he has not picked that up as yet.  He plans to do so this week.  In the past he has found the Zofran to be helpful.  He does not find Pepto-Bismol  helpful.   Outpatient Encounter Medications as of 06/18/2021  Medication Sig   acetaminophen (TYLENOL) 500 MG tablet Take 1,000 mg by mouth every 6 (six) hours as needed for mild pain.   amLODipine (NORVASC) 5 MG tablet Take 1 tablet (5 mg total) by mouth daily.   aspirin 325 MG EC tablet Take 325 mg by mouth daily as needed for pain.   DULoxetine (CYMBALTA) 20 MG capsule Take 1 capsule (20 mg total) by mouth daily.   famotidine (PEPCID) 20 MG tablet Take 1 tablet (20 mg total) by mouth 2 (two) times daily.   ferrous sulfate 325 (65 FE) MG tablet Take 1 tablet (325 mg total) by mouth daily.   ondansetron (ZOFRAN) 4 MG tablet Take 1 tablet (4 mg total) by mouth every 6 (six) hours.   Pancrelipase, Lip-Prot-Amyl, (CREON) 24000-76000 units CPEP Take 1 capsule (24,000 Units total) by mouth with breakfast, with lunch, and with evening meal.   pantoprazole (PROTONIX) 40 MG tablet Take 1 tablet (40 mg total) by mouth daily.   promethazine (PHENERGAN) 25 MG suppository PLACE 1 SUPPOSITORY (25 MG TOTAL) RECTALLY EVERY 6 (SIX) HOURS AS NEEDED FOR NAUSEA OR VOMITING. (Patient taking differently: Place 25 mg rectally every 6 (six) hours as needed for nausea or vomiting.)   sucralfate (CARAFATE) 1 GM/10ML suspension Take 10 mLs (1 g total) by mouth 4 (four) times daily -  with meals and at bedtime.   traMADol (ULTRAM) 50 MG tablet Take 1 tablet (50 mg  total) by mouth every 12 (twelve) hours as needed for up to 5 days for moderate pain or severe pain.   No facility-administered encounter medications on file as of 06/18/2021.      Observations/Objective: No direct observation done as this was a telephone encounter.  Assessment and Plan: 1. Exocrine pancreatic insufficiency ([place]) -Advised patient to try eating smaller but more frequent meals. We discussed increasing Creon but patient states that at times he doubles up on his dose and it does not make a difference with his symptoms.  Recommend he use the  Zofran as needed for the nausea. I do not think putting him on prednisone to bring about weight gain is a good idea as long-term use can cause other health issues including diabetes and osteoporosis.  Recommend that he speak with the gastroenterologist to see if there is anything he would recommend.   Follow Up Instructions:    I discussed the assessment and treatment plan with the patient. The patient was provided an opportunity to ask questions and all were answered. The patient agreed with the plan and demonstrated an understanding of the instructions.   The patient was advised to call back or seek an in-person evaluation if the symptoms worsen or if the condition fails to improve as anticipated.  I  Spent 15 minutes on this telephone encounter  Karle Plumber, MD

## 2021-06-18 NOTE — Telephone Encounter (Signed)
Pt c/o weakness x 1 week, loss of appetite, weight loss, SOB with activity, watery diarrhea and vomiting. Pt stated he wanted Prednisone so he can eat. Pt has an upcoming virtual visit but cannot sign in to his MyChart. Pt asking if it can be a phone visit. Team messaged [place] and [place] at office.

## 2021-06-18 NOTE — Telephone Encounter (Signed)
Reason for Disposition  [1] MODERATE weakness (i.e., interferes with work, school, normal activities) AND [2] persists > 3 days  Answer Assessment - Initial Assessment Questions 1. DESCRIPTION: "Describe how you are feeling."     Feels wiped out 2. SEVERITY: "How bad is it?"  "Can you stand and walk?"   - MILD - Feels weak or tired, but does not interfere with work, school or normal activities   - [place] to stand and walk; weakness interferes with work, school, or normal activities   - SEVERE - Unable to stand or walk     moderate 3. ONSET:  "When did the weakness begin?"     1 week ago 4. CAUSE: "What do you think is causing the weakness?"     Pt does not know 5. MEDICINES: "Have you recently started a new medicine or had a change in the amount of a medicine?"     no 6. OTHER SYMPTOMS: "Do you have any other symptoms?" (e.g., chest pain, fever, cough, SOB, vomiting, diarrhea, bleeding, other areas of pain)     Vomiting and diarrhea for 4 months, gets lightheaded with walking. 7. PREGNANCY: "Is there any chance you are pregnant?" "When was your last menstrual period?"    N/A  Protocols used: Weakness (Generalized) and Fatigue-A-AH

## 2021-06-19 NOTE — Telephone Encounter (Signed)
Pt had a virtual appt wit pcp on 9/20

## 2021-06-25 ENCOUNTER — Other Ambulatory Visit: Payer: Self-pay

## 2021-07-05 ENCOUNTER — Emergency Department (HOSPITAL_COMMUNITY): Payer: Medicaid Other

## 2021-07-05 ENCOUNTER — Encounter (HOSPITAL_COMMUNITY): Payer: Self-pay | Admitting: Emergency Medicine

## 2021-07-05 ENCOUNTER — Emergency Department (HOSPITAL_COMMUNITY)
Admission: EM | Admit: 2021-07-05 | Discharge: 2021-07-05 | Disposition: A | Discharge status: Home / Self Care | Payer: Medicaid Other | Attending: Emergency Medicine | Admitting: Emergency Medicine

## 2021-07-05 ENCOUNTER — Other Ambulatory Visit: Payer: Self-pay

## 2021-07-05 DIAGNOSIS — W19XXXA Unspecified fall, initial encounter: Secondary | ICD-10-CM

## 2021-07-05 DIAGNOSIS — I10 Essential (primary) hypertension: Secondary | ICD-10-CM | POA: Diagnosis not present

## 2021-07-05 DIAGNOSIS — W1839XA Other fall on same level, initial encounter: Secondary | ICD-10-CM | POA: Insufficient documentation

## 2021-07-05 DIAGNOSIS — R112 Nausea with vomiting, unspecified: Secondary | ICD-10-CM | POA: Diagnosis not present

## 2021-07-05 DIAGNOSIS — R109 Unspecified abdominal pain: Secondary | ICD-10-CM | POA: Diagnosis not present

## 2021-07-05 DIAGNOSIS — R197 Diarrhea, unspecified: Secondary | ICD-10-CM | POA: Diagnosis not present

## 2021-07-05 DIAGNOSIS — F17210 Nicotine dependence, cigarettes, uncomplicated: Secondary | ICD-10-CM | POA: Diagnosis not present

## 2021-07-05 DIAGNOSIS — M545 Low back pain, unspecified: Secondary | ICD-10-CM | POA: Diagnosis present

## 2021-07-05 DIAGNOSIS — Z79899 Other long term (current) drug therapy: Secondary | ICD-10-CM | POA: Insufficient documentation

## 2021-07-05 LAB — CBC
HCT: 40.9 % (ref 39.0–52.0)
Hemoglobin: 13.6 g/dL (ref 13.0–17.0)
MCH: 30.5 pg (ref 26.0–34.0)
MCHC: 33.3 g/dL (ref 30.0–36.0)
MCV: 91.7 fL (ref 80.0–100.0)
Platelets: 264 10*3/uL (ref 150–400)
RBC: 4.46 MIL/uL (ref 4.22–5.81)
RDW: 16.1 % — ABNORMAL HIGH (ref 11.5–15.5)
WBC: 9.1 10*3/uL (ref 4.0–10.5)
nRBC: 0 % (ref 0.0–0.2)

## 2021-07-05 LAB — URINALYSIS, ROUTINE W REFLEX MICROSCOPIC
Bilirubin Urine: NEGATIVE
Glucose, UA: NEGATIVE mg/dL
Hgb urine dipstick: NEGATIVE
Ketones, ur: NEGATIVE mg/dL
Leukocytes,Ua: NEGATIVE
Nitrite: NEGATIVE
Protein, ur: NEGATIVE mg/dL
Specific Gravity, Urine: 1.021 (ref 1.005–1.030)
pH: 5 (ref 5.0–8.0)

## 2021-07-05 LAB — RAPID URINE DRUG SCREEN, HOSP PERFORMED
Amphetamines: NOT DETECTED
Barbiturates: NOT DETECTED
Benzodiazepines: NOT DETECTED
Cocaine: NOT DETECTED
Opiates: NOT DETECTED
Tetrahydrocannabinol: NOT DETECTED

## 2021-07-05 LAB — COMPREHENSIVE METABOLIC PANEL
ALT: 39 U/L (ref 0–44)
AST: 63 U/L — ABNORMAL HIGH (ref 15–41)
Albumin: 3.2 g/dL — ABNORMAL LOW (ref 3.5–5.0)
Alkaline Phosphatase: 143 U/L — ABNORMAL HIGH (ref 38–126)
Anion gap: 9 (ref 5–15)
BUN: 6 mg/dL (ref 6–20)
CO2: 25 mmol/L (ref 22–32)
Calcium: 8.9 mg/dL (ref 8.9–10.3)
Chloride: 108 mmol/L (ref 98–111)
Creatinine, Ser: 0.7 mg/dL (ref 0.61–1.24)
GFR, Estimated: >60 mL/min (ref >60)
Glucose, Bld: 123 mg/dL — ABNORMAL HIGH (ref 70–99)
Potassium: 4.7 mmol/L (ref 3.5–5.1)
Sodium: 142 mmol/L (ref 135–145)
Total Bilirubin: 0.4 mg/dL (ref 0.3–1.2)
Total Protein: 6.8 g/dL (ref 6.5–8.1)

## 2021-07-05 LAB — LIPASE, BLOOD: Lipase: 20 U/L (ref 11–51)

## 2021-07-05 MED ORDER — SODIUM CHLORIDE 0.9 % IV SOLN
12.5000 mg | Freq: Four times a day (QID) | INTRAVENOUS | Status: DC | PRN
  Administered 2021-07-05: 12.5 mg via INTRAVENOUS
  Filled 2021-07-05: qty 12.5

## 2021-07-05 MED ORDER — PROMETHAZINE HCL 25 MG PO TABS
25.0000 mg | ORAL_TABLET | Freq: Four times a day (QID) | ORAL | 0 refills | Status: DC | PRN
  Filled 2021-07-05 – 2021-07-17 (×2): qty 15, 4d supply, fill #0

## 2021-07-05 MED ORDER — SODIUM CHLORIDE 0.9 % IV BOLUS
1000.0000 mL | Freq: Once | INTRAVENOUS | Status: AC
  Administered 2021-07-05: 1000 mL via INTRAVENOUS

## 2021-07-05 MED ORDER — ONDANSETRON HCL 4 MG/2ML IJ SOLN
4.0000 mg | Freq: Once | INTRAMUSCULAR | Status: AC
  Administered 2021-07-05: 4 mg via INTRAVENOUS
  Filled 2021-07-05: qty 2

## 2021-07-05 MED ORDER — FENTANYL CITRATE PF 50 MCG/ML IJ SOSY
100.0000 ug | PREFILLED_SYRINGE | Freq: Once | INTRAMUSCULAR | Status: AC
  Administered 2021-07-05: 100 ug via INTRAVENOUS
  Filled 2021-07-05: qty 2

## 2021-07-05 NOTE — ED Provider Notes (Signed)
[facility] DEPT Provider Note   CSN: [id number] Arrival date & time: 07/05/21  0409     History Chief Complaint  Patient presents with   Nausea   Vomiting   Diarrhea    Brent Rogers is a 55 y.o. male.  HPI He presents with a personal concern for recurrent pancreatitis.  He complains of nausea, vomiting and diarrhea for several days.  He has had a little bit of blood in the vomit in emesis but none since 3 days ago.  He has had multiple episodes of falling and feels like he injured his lower back or "butt bone."  He denies fever, chills, cough, shortness of breath or chest pain.  He has abdominal pain that he states is in "my left side."  He is ambulatory, presenting by EMS..  Recently had upper and lower endoscopy on 03/11/21.  Upper endoscopy revealed a several areas of esophageal stenosis, ambulating in the jejunum, he had bowel dilatation procedures as well as hemostasis procedures including clip.  He had clips placed that were stated to be "MRI conditional."  Upper endoscopy at that time was complicated by a posterior perforation.  This was addressed by ENT consultation.    Past Medical History:  Diagnosis Date   Benign tumor of endocrine pancreas    Chronic pancreatitis ([place])    S/P Whipple   Foot ulcer with fat layer exposed ([place])    Hypertension    Migraine    "@ least once/month" (11/12/2015)   Pancreatic abnormality    CT  shows mass   Pneumonia 11/12/2015   Scoliosis     Patient Active Problem List   Diagnosis Date Noted   Major depressive disorder, recurrent episode, moderate ([place]) 03/24/2021   Open wound of pharynx without complication 46/27/0350   Atherosclerosis of native coronary artery of native heart without angina pectoris    Blood in stool    Iron deficiency anemia    Syncope 03/06/2021   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   Chronic pain syndrome 06/07/2019   Depression 06/07/2019   Chronic pancreatitis  ([place]) 05/01/2017   Foot callus 12/25/2015   Onychomycosis of toenail 12/25/2015   Fatty liver 11/12/2015   GERD (gastroesophageal reflux disease) 11/12/2015   HTN (hypertension) 07/10/2015   S/P cholecystectomy 07/10/2015   Tobacco abuse 12/26/2014   Protein-calorie malnutrition, severe ([place]) 11/20/2014   Exocrine pancreatic insufficiency ([place]) 12/23/2013    Past Surgical History:  Procedure Laterality Date   BACK SURGERY     COLONOSCOPY WITH PROPOFOL N/A 03/11/2021   Procedure: COLONOSCOPY WITH PROPOFOL;  Surgeon: Carol Ada, MD;  Location: [facility];  Service: Endoscopy;  Laterality: N/A;   ESOPHAGOGASTRODUODENOSCOPY (EGD) WITH PROPOFOL N/A 03/11/2021   Procedure: ESOPHAGOGASTRODUODENOSCOPY (EGD) WITH PROPOFOL;  Surgeon: Carol Ada, MD;  Location: [facility];  Service: Endoscopy;  Laterality: N/A;   EUS N/A 09/21/2013   Procedure: ESOPHAGEAL ENDOSCOPIC ULTRASOUND (EUS) RADIAL;  Surgeon: Beryle Beams, MD;  Location: [facility];  Service: Endoscopy;  Laterality: N/A;   EUS N/A 09/30/2013   Procedure: UPPER ENDOSCOPIC ULTRASOUND (EUS) LINEAR;  Surgeon: Beryle Beams, MD;  Location: [facility];  Service: Endoscopy;  Laterality: N/A;   FINE NEEDLE ASPIRATION N/A 09/21/2013   Procedure: FINE NEEDLE ASPIRATION (FNA) LINEAR;  Surgeon: Beryle Beams, MD;  Location: [facility];  Service: Endoscopy;  Laterality: N/A;   FINGER FRACTURE SURGERY Left 1995   5th digit   FRACTURE SURGERY     HEMOSTASIS CLIP PLACEMENT  03/11/2021   Procedure: HEMOSTASIS CLIP PLACEMENT;  Surgeon: Carol Ada, MD;  Location: [facility] END[facility]COPY;  Service: Endoscopy;;   HEMOSTASIS CONTROL  03/11/2021   Procedure: HEMOSTASIS CONTROL;  Surgeon: Carol Ada, MD;  Location: [place];  Service: Endoscopy;;   KNEE ARTHROSCOPY Bilateral 1987-1989   right-left   LAPAROSCOPY N/A 12/01/2013   Procedure: LAPAROSCOPY DIAGNOSTIC PANCREATICODUODENECTOMY WITH BILIARY AND PANCREATIC STENTS;  Surgeon: Stark Klein, MD;   Location: [facility];  Service: General;  Laterality: N/A;   LUMBAR [place] SURGERY  1990's   SAVORY DILATION N/A 03/11/2021   Procedure: SAVORY DILATION;  Surgeon: Carol Ada, MD;  Location: [place];  Service: Endoscopy;  Laterality: N/A;   WHIPPLE PROCEDURE  12/01/2013       Family History  Problem Relation Age of Onset   Cancer Mother        unsure   COPD Mother    Hypertension Mother    Hypertension Father    COPD Father    CAD Sister        possible has stent per pt    Social History   Tobacco Use   Smoking status: Every Day    Packs/day: 0.25    Years: 30.00    Pack years: 7.50    Types: Cigarettes   Smokeless tobacco: Former    Types: Snuff   Tobacco comments:    "used snuff 10-15 years in my 20s-30s"  Vaping Use   Vaping Use: Never used  Substance Use Topics   Alcohol use: Yes    Alcohol/week: 4.0 standard drinks    Types: 4 Cans of beer per week    Comment: weekly   Drug use: No    Home Medications Prior to Admission medications   Medication Sig Start Date End Date Taking? Authorizing Provider  acetaminophen (TYLENOL) 500 MG tablet Take 1,000 mg by mouth every 6 (six) hours as needed for mild pain.   Yes [provider]  amLODipine (NORVASC) 5 MG tablet Take 1 tablet (5 mg total) by mouth daily. 03/21/21  Yes Ladell Pier, MD  DULoxetine (CYMBALTA) 20 MG capsule Take 1 capsule (20 mg total) by mouth daily. 03/21/21  Yes Ladell Pier, MD  ferrous sulfate 325 (65 FE) MG tablet Take 1 tablet (325 mg total) by mouth daily. 03/16/21  Yes Ladell Pier, MD  ondansetron (ZOFRAN) 4 MG tablet Take 1 tablet (4 mg total) by mouth every 6 (six) hours. Patient taking differently: Take 4 mg by mouth every 6 (six) hours as needed for nausea or vomiting. 06/07/91  Yes Campbell Stall P, DO  Pancrelipase, Lip-Prot-Amyl, (CREON) 24000-76000 units CPEP Take 1 capsule (24,000 Units total) by mouth with breakfast, with lunch, and with evening meal. 03/16/21   Yes Ladell Pier, MD  pantoprazole (PROTONIX) 40 MG tablet Take 1 tablet (40 mg total) by mouth daily. 03/16/21  Yes Ladell Pier, MD  famotidine (PEPCID) 20 MG tablet Take 1 tablet (20 mg total) by mouth 2 (two) times daily. Patient not taking: No sig reported 09/29/92   Campbell Stall P, DO  promethazine (PHENERGAN) 25 MG tablet Take 1 tablet (25 mg total) by mouth every 6 (six) hours as needed for nausea or vomiting. 07/05/21  Yes Daleen Bo, MD  sucralfate (CARAFATE) 1 GM/10ML suspension Take 10 mLs (1 g total) by mouth 4 (four) times daily -  with meals and at bedtime. Patient not taking: No sig reported 10/05/38   Lianne Cure, DO  Allergies    Patient has no known allergies.  Review of Systems   Review of Systems  All other systems reviewed and are negative.  Physical Exam Updated Vital Signs BP (!) 126/98   Pulse 80   Temp 98.4 F (36.9 C) (Oral)   Resp 16   Ht 6\' 3"  (1.905 m)   Wt 70.3 kg   SpO2 99%   BMI 19.37 kg/m   Physical Exam Vitals and nursing note reviewed.  Constitutional:      General: He is not in acute distress.    Appearance: He is well-developed. He is not ill-appearing, toxic-appearing or diaphoretic.     Comments: He appears under nourished.  HENT:     Head: Normocephalic and atraumatic.     Right Ear: External ear normal.     Left Ear: External ear normal.     Nose: No congestion or rhinorrhea.     Mouth/Throat:     Mouth: Mucous membranes are moist.  Eyes:     Conjunctiva/sclera: Conjunctivae normal.     Pupils: Pupils are equal, round, and reactive to light.  Neck:     Trachea: Phonation normal.  Cardiovascular:     Rate and Rhythm: Normal rate and regular rhythm.     Heart sounds: Normal heart sounds.  Pulmonary:     Effort: Pulmonary effort is normal.     Breath sounds: Normal breath sounds.  Abdominal:     General: There is no distension.     Palpations: Abdomen is soft.     Tenderness: There is no abdominal  tenderness.  Musculoskeletal:        General: Tenderness (Left sacral and left lumbar region, mild) present. No swelling. Normal range of motion.     Cervical back: Normal range of motion and neck supple.  Skin:    General: Skin is warm and dry.  Neurological:     Mental Status: He is alert and oriented to person, place, and time.     Cranial Nerves: No cranial nerve deficit.     Sensory: No sensory deficit.     Motor: No abnormal muscle tone.     Coordination: Coordination normal.  Psychiatric:        Mood and Affect: Mood normal.        Behavior: Behavior normal.        Thought Content: Thought content normal.        Judgment: Judgment normal.    ED Results / Procedures / Treatments   Labs (all labs ordered are listed, but only abnormal results are displayed) Labs Reviewed  COMPREHENSIVE METABOLIC PANEL - Abnormal; Notable for the following components:      Result Value   Glucose, Bld 123 (*)    Albumin 3.2 (*)    AST 63 (*)    Alkaline Phosphatase 143 (*)    All other components within normal limits  CBC - Abnormal; Notable for the following components:   RDW 16.1 (*)    All other components within normal limits  LIPASE, BLOOD  URINALYSIS, ROUTINE W REFLEX MICROSCOPIC  RAPID URINE DRUG SCREEN, HOSP PERFORMED    EKG None  Radiology CT Lumbar Spine Wo Contrast  Result Date: 07/05/2021 CLINICAL DATA:  Compression fracture, lumbar spine. Back pain radiating into the left leg after falling 3 days ago. EXAM: CT LUMBAR SPINE WITHOUT CONTRAST TECHNIQUE: Multidetector CT imaging of the lumbar spine was performed without intravenous contrast administration. Multiplanar CT image reconstructions were also generated. COMPARISON:  Abdominopelvic CT 06/03/2021. FINDINGS: Segmentation: There are 5 lumbar type vertebral bodies. Alignment: Minimal convex right scoliosis. No focal angulation or significant listhesis. Vertebrae: No evidence of acute lumbar spine fracture or traumatic  subluxation. No evidence of pars defect. Multilevel endplate osteophytes, largest on the left at L2-3. Right-sided lumbosacral assimilation joint. Paraspinal and other soft tissues: No acute paraspinal findings. Mild aortic and branch vessel atherosclerosis. Disc levels: No significant disc space findings at T12-L1 or L1-2. L2-3: Mild disc bulging and facet hypertrophy. No significant spinal stenosis. L3-4: Mild disc bulging and bilateral facet hypertrophy. No significant spinal stenosis. L4-5: Disc bulging and endplate osteophytes asymmetric to the left. Mild left foraminal narrowing with possible extraforaminal left L4 nerve root encroachment. Mild facet and ligamentous hypertrophy. L5-S1. Chronic degenerative disc disease with loss of disc height, annular disc bulging and endplate osteophytes. Mild facet and ligamentous hypertrophy. No significant spinal stenosis. IMPRESSION: 1. No acute posttraumatic findings in the lumbar spine. 2. Multilevel lumbar spondylosis as described with chronic disc bulging asymmetric to the left at L4-5 resulting in possible extraforaminal left L4 nerve root encroachment. This is unchanged from previous abdominal CT. 3.  Aortic Atherosclerosis (ICD10-I70.0). Electronically Signed   By: Richardean Sale M.D.   On: 07/05/2021 13:24    Procedures Procedures   Medications Ordered in ED Medications  promethazine (PHENERGAN) 12.5 mg in sodium chloride 0.9 % 50 mL IVPB (has no administration in time range)  sodium chloride 0.9 % bolus 1,000 mL (0 mLs Intravenous Stopped 07/05/21 1207)  fentaNYL (SUBLIMAZE) injection 100 mcg (100 mcg Intravenous Given 07/05/21 1210)  ondansetron (ZOFRAN) injection 4 mg (4 mg Intravenous Given 07/05/21 1210)    ED Course  I have reviewed the triage vital signs and the nursing notes.  Pertinent labs & imaging results that were available during my care of the patient were reviewed by me and considered in my medical decision making (see chart for  details).  Clinical Course as of 07/07/21 3086  Ludwig Clarks Jul 05, 2021  1135 He is still uncomfortable with low back pain and vomiting.  Will order imaging to assess for fracture of the lumbar or sacral spine [EW]    Clinical Course User Index [EW] Daleen Bo, MD   MDM Rules/Calculators/A&P                            Patient Vitals for the past 24 hrs:  BP Temp Temp src Pulse Resp SpO2 Height Weight  07/05/21 1200 (!) 126/98 -- -- 80 16 99 % -- --  07/05/21 1100 (!) 148/92 -- -- 67 14 100 % -- --  07/05/21 1030 (!) 121/94 -- -- 78 11 98 % -- --  07/05/21 1000 (!) 110/93 -- -- 97 (!) 24 95 % -- --  07/05/21 0900 (!) 136/98 -- -- 78 14 100 % -- --  07/05/21 0830 (!) 141/102 -- -- 82 14 100 % -- --  07/05/21 0730 (!) 127/101 -- -- 93 15 99 % -- --  07/05/21 0615 (!) 125/95 -- -- 100 16 100 % -- --  07/05/21 0417 (!) 130/110 98.4 F (36.9 C) Oral (!) 112 15 100 % -- --  07/05/21 0415 -- -- -- -- -- -- 6\' 3"  (1.905 m) 70.3 kg    2:04 PM Reevaluation with update and discussion. After initial assessment and treatment, an updated evaluation reveals he states he has vomited some since initial evaluation.  There is a small  amount of greenish emesis in the bag at his bedside.  He is comfortable enough to go home.  Findings discussed and questions answered. Daleen Bo   Medical Decision Making:  This patient is presenting for evaluation of vomiting and diarrhea with left flank pain, which does require a range of treatment options, and is a complaint that involves a moderate risk of morbidity and mortality. The differential diagnoses include enteritis, metabolic disorder, pancreatitis. I decided to review old records, and in summary millage male with history of pancreatic insufficiency, tobacco abuse, hypertension, GERD, chronic pancreatitis, chronic pain syndrome.  I did not require additional historical information from anyone.  Clinical Laboratory Tests Ordered, included lipase, urine  drug screen. Review indicates normal except glucose high. Radiologic Tests Ordered, included CT lumbar.  I independently Visualized: Radiographic images, which show no fracture or dislocation.  Chronic changes.    Critical Interventions-clinical evaluation, laboratory testing, IV fluids  After These Interventions, the Patient was reevaluated and was found with nonspecific vomiting and diarrhea with chronic malnutrition, BMI less than 20.  Screening evaluation, labs, normal.  Vital signs with mild persistent hypertension.  He has been treated with amlodipine for high blood pressure.  He has been prescribed enzymes for pancreatic insufficiency.  No evidence of acute pancreatitis today.  Blood pressure reassuring in discharge.  CRITICAL CARE-no Performed by: Daleen Bo  Nursing Notes Reviewed/ Care Coordinated Applicable Imaging Reviewed Interpretation of Laboratory Data incorporated into ED treatment  The patient appears reasonably screened and/or stabilized for discharge and I doubt any other medical condition or other [facility] requiring further screening, evaluation, or treatment in the ED at this time prior to discharge.  Plan: Home Medications-continue usual, take Tylenol for pain; Home Treatments-gradually [employer] Fruits diet and activity; return here if the recommended treatment, does not improve the symptoms; Recommended follow up-PCP, PRN     Final Clinical Impression(s) / ED Diagnoses Final diagnoses:  Acute bilateral low back pain without sciatica  Fall, initial encounter    Rx / DC Orders ED Discharge Orders          Ordered    promethazine (PHENERGAN) 25 MG tablet  Every 6 hours PRN        07/05/21 1409             Daleen Bo, MD 07/07/21 [phone]

## 2021-07-05 NOTE — ED Notes (Signed)
Pt given sprite for PO challenge

## 2021-07-05 NOTE — Discharge Instructions (Addendum)
Start with a clear liquid diet, then gradual advance to regular foods over 2 or 3 days time.  The CAT scan of your back did not show any serious injuries.  You do have some degenerative changes that would likely be aggravated after recent fall.  For pain, use Tylenol every 4 hours.  Call your doctor for follow-up appointment as needed.

## 2021-07-05 NOTE — ED Triage Notes (Addendum)
Patient complaining of nausea, vomiting, and diarrhea x 1 weak with some dizziness. Patient walked to triage from the ambulance. Patient is complaining of back pain and left side pain. Patient states is coming from pancreatitis.

## 2021-07-11 ENCOUNTER — Other Ambulatory Visit: Payer: Self-pay

## 2021-07-11 ENCOUNTER — Other Ambulatory Visit: Payer: Self-pay | Admitting: Internal Medicine

## 2021-07-11 DIAGNOSIS — K861 Other chronic pancreatitis: Secondary | ICD-10-CM

## 2021-07-12 ENCOUNTER — Other Ambulatory Visit: Payer: Self-pay

## 2021-07-12 NOTE — Telephone Encounter (Signed)
Requested medication (s) are due for refill today - expired Rx  Requested medication (s) are on the active medication list -no  Future visit scheduled -no  Last refill: 06/22/20 #30 2RF  Notes to clinic: Request RF: non delegated Rx, expired Rx  Requested Prescriptions  Pending Prescriptions Disp Refills   traMADol (ULTRAM) 50 MG tablet 30 tablet 2    Sig: TAKE 1 TABLET (50 MG TOTAL) BY MOUTH EVERY 12 (TWELVE) HOURS AS NEEDED FOR MODERATE PAIN.     Not Delegated - Analgesics:  Opioid Agonists Failed - 07/11/2021  4:24 PM      Failed - This refill cannot be delegated      Passed - Urine Drug Screen completed in last 360 days      Passed - Valid encounter within last 6 months    Recent Outpatient Visits           3 weeks ago Exocrine pancreatic insufficiency [facility])   [place] Ladell Pier, MD   3 months ago Hospital discharge follow-up   [place], MD   10 months ago Essential hypertension   [place], RPH-CPP   11 months ago Essential hypertension   [place], Stephen L, RPH-CPP   11 months ago Essential hypertension   [place], Jarome Matin, RPH-CPP                 Requested Prescriptions  Pending Prescriptions Disp Refills   traMADol (ULTRAM) 50 MG tablet 30 tablet 2    Sig: TAKE 1 TABLET (50 MG TOTAL) BY MOUTH EVERY 12 (TWELVE) HOURS AS NEEDED FOR MODERATE PAIN.     Not Delegated - Analgesics:  Opioid Agonists Failed - 07/11/2021  4:24 PM      Failed - This refill cannot be delegated      Passed - Urine Drug Screen completed in last 360 days      Passed - Valid encounter within last 6 months    Recent Outpatient Visits           3 weeks ago Exocrine pancreatic insufficiency [facility])   [place]  Ladell Pier, MD   3 months ago Hospital discharge follow-up   [place], MD   10 months ago Essential hypertension   [place], RPH-CPP   11 months ago Essential hypertension   [place], RPH-CPP   11 months ago Essential hypertension   [place], RPH-CPP

## 2021-07-17 ENCOUNTER — Other Ambulatory Visit: Payer: Self-pay

## 2021-07-18 ENCOUNTER — Other Ambulatory Visit: Payer: Self-pay

## 2021-07-23 ENCOUNTER — Other Ambulatory Visit: Payer: Self-pay

## 2021-07-30 ENCOUNTER — Other Ambulatory Visit: Payer: Self-pay

## 2021-08-13 ENCOUNTER — Other Ambulatory Visit: Payer: Self-pay

## 2021-08-13 ENCOUNTER — Encounter (HOSPITAL_COMMUNITY): Payer: Self-pay

## 2021-08-13 ENCOUNTER — Emergency Department (HOSPITAL_COMMUNITY): Payer: Medicaid Other

## 2021-08-13 ENCOUNTER — Emergency Department (HOSPITAL_COMMUNITY)
Admission: EM | Admit: 2021-08-13 | Discharge: 2021-08-13 | Disposition: A | Discharge status: Home / Self Care | Payer: Medicaid Other | Attending: Emergency Medicine | Admitting: Emergency Medicine

## 2021-08-13 DIAGNOSIS — I10 Essential (primary) hypertension: Secondary | ICD-10-CM | POA: Diagnosis not present

## 2021-08-13 DIAGNOSIS — G8929 Other chronic pain: Secondary | ICD-10-CM

## 2021-08-13 DIAGNOSIS — R112 Nausea with vomiting, unspecified: Secondary | ICD-10-CM | POA: Diagnosis present

## 2021-08-13 DIAGNOSIS — Z79899 Other long term (current) drug therapy: Secondary | ICD-10-CM | POA: Diagnosis not present

## 2021-08-13 DIAGNOSIS — F17210 Nicotine dependence, cigarettes, uncomplicated: Secondary | ICD-10-CM | POA: Insufficient documentation

## 2021-08-13 DIAGNOSIS — R1012 Left upper quadrant pain: Secondary | ICD-10-CM | POA: Diagnosis not present

## 2021-08-13 DIAGNOSIS — R1010 Upper abdominal pain, unspecified: Secondary | ICD-10-CM

## 2021-08-13 DIAGNOSIS — Z20822 Contact with and (suspected) exposure to covid-19: Secondary | ICD-10-CM | POA: Diagnosis not present

## 2021-08-13 LAB — CBC WITH DIFFERENTIAL/PLATELET
Abs Immature Granulocytes: 0.08 10*3/uL — ABNORMAL HIGH (ref 0.00–0.07)
Basophils Absolute: 0.1 10*3/uL (ref 0.0–0.1)
Basophils Relative: 1 %
Eosinophils Absolute: 0.1 10*3/uL (ref 0.0–0.5)
Eosinophils Relative: 1 %
HCT: 35.1 % — ABNORMAL LOW (ref 39.0–52.0)
Hemoglobin: 11.7 g/dL — ABNORMAL LOW (ref 13.0–17.0)
Immature Granulocytes: 1 %
Lymphocytes Relative: 26 %
Lymphs Abs: 2.4 10*3/uL (ref 0.7–4.0)
MCH: 30.9 pg (ref 26.0–34.0)
MCHC: 33.3 g/dL (ref 30.0–36.0)
MCV: 92.6 fL (ref 80.0–100.0)
Monocytes Absolute: 0.9 10*3/uL (ref 0.1–1.0)
Monocytes Relative: 10 %
Neutro Abs: 5.8 10*3/uL (ref 1.7–7.7)
Neutrophils Relative %: 61 %
Platelets: 191 10*3/uL (ref 150–400)
RBC: 3.79 MIL/uL — ABNORMAL LOW (ref 4.22–5.81)
RDW: 14.5 % (ref 11.5–15.5)
WBC: 9.4 10*3/uL (ref 4.0–10.5)
nRBC: 0 % (ref 0.0–0.2)

## 2021-08-13 LAB — COMPREHENSIVE METABOLIC PANEL
ALT: 40 U/L (ref 0–44)
AST: 72 U/L — ABNORMAL HIGH (ref 15–41)
Albumin: 2.7 g/dL — ABNORMAL LOW (ref 3.5–5.0)
Alkaline Phosphatase: 143 U/L — ABNORMAL HIGH (ref 38–126)
Anion gap: 7 (ref 5–15)
BUN: 6 mg/dL (ref 6–20)
CO2: 26 mmol/L (ref 22–32)
Calcium: 8.2 mg/dL — ABNORMAL LOW (ref 8.9–10.3)
Chloride: 104 mmol/L (ref 98–111)
Creatinine, Ser: 0.47 mg/dL — ABNORMAL LOW (ref 0.61–1.24)
GFR, Estimated: >60 mL/min (ref >60)
Glucose, Bld: 97 mg/dL (ref 70–99)
Potassium: 4.6 mmol/L (ref 3.5–5.1)
Sodium: 137 mmol/L (ref 135–145)
Total Bilirubin: 1 mg/dL (ref 0.3–1.2)
Total Protein: 5.8 g/dL — ABNORMAL LOW (ref 6.5–8.1)

## 2021-08-13 LAB — URINALYSIS, ROUTINE W REFLEX MICROSCOPIC
Bilirubin Urine: NEGATIVE
Glucose, UA: NEGATIVE mg/dL
Hgb urine dipstick: NEGATIVE
Ketones, ur: NEGATIVE mg/dL
Nitrite: NEGATIVE
Protein, ur: NEGATIVE mg/dL
Specific Gravity, Urine: 1.029 (ref 1.005–1.030)
pH: 5 (ref 5.0–8.0)

## 2021-08-13 LAB — LIPASE, BLOOD: Lipase: 19 U/L (ref 11–51)

## 2021-08-13 LAB — RESP PANEL BY RT-PCR (FLU A&B, COVID) ARPGX2
Influenza A by PCR: NEGATIVE
Influenza B by PCR: NEGATIVE
SARS Coronavirus 2 by RT PCR: NEGATIVE

## 2021-08-13 MED ORDER — HALOPERIDOL LACTATE 5 MG/ML IJ SOLN
5.0000 mg | Freq: Once | INTRAMUSCULAR | Status: AC
  Administered 2021-08-13: 5 mg via INTRAVENOUS
  Filled 2021-08-13: qty 1

## 2021-08-13 MED ORDER — LACTATED RINGERS IV BOLUS
1000.0000 mL | Freq: Once | INTRAVENOUS | Status: AC
  Administered 2021-08-13: 1000 mL via INTRAVENOUS

## 2021-08-13 MED ORDER — MORPHINE SULFATE (PF) 4 MG/ML IV SOLN
4.0000 mg | Freq: Once | INTRAVENOUS | Status: AC
  Administered 2021-08-13: 4 mg via INTRAVENOUS
  Filled 2021-08-13: qty 1

## 2021-08-13 MED ORDER — PROCHLORPERAZINE EDISYLATE 10 MG/2ML IJ SOLN
10.0000 mg | Freq: Once | INTRAMUSCULAR | Status: AC
  Administered 2021-08-13: 10 mg via INTRAVENOUS
  Filled 2021-08-13: qty 2

## 2021-08-13 MED ORDER — PANTOPRAZOLE 80MG IVPB - SIMPLE MED
80.0000 mg | Freq: Once | INTRAVENOUS | Status: AC
  Administered 2021-08-13: 80 mg via INTRAVENOUS
  Filled 2021-08-13: qty 80

## 2021-08-13 MED ORDER — METOCLOPRAMIDE HCL 10 MG PO TABS
10.0000 mg | ORAL_TABLET | Freq: Once | ORAL | Status: AC
  Administered 2021-08-13: 10 mg via ORAL
  Filled 2021-08-13: qty 1

## 2021-08-13 MED ORDER — HYDROCODONE-ACETAMINOPHEN 5-325 MG PO TABS
1.0000 | ORAL_TABLET | Freq: Once | ORAL | Status: AC
  Administered 2021-08-13: 1 via ORAL
  Filled 2021-08-13: qty 1

## 2021-08-13 MED ORDER — LIDOCAINE VISCOUS HCL 2 % MT SOLN
15.0000 mL | Freq: Once | OROMUCOSAL | Status: AC
  Administered 2021-08-13: 15 mL via ORAL
  Filled 2021-08-13: qty 15

## 2021-08-13 MED ORDER — ALUM & MAG HYDROXIDE-SIMETH 200-200-20 MG/5ML PO SUSP
30.0000 mL | Freq: Once | ORAL | Status: AC
  Administered 2021-08-13: 30 mL via ORAL
  Filled 2021-08-13: qty 30

## 2021-08-13 MED ORDER — ONDANSETRON HCL 4 MG/2ML IJ SOLN
4.0000 mg | Freq: Once | INTRAMUSCULAR | Status: AC
  Administered 2021-08-13: 4 mg via INTRAVENOUS
  Filled 2021-08-13: qty 2

## 2021-08-13 MED ORDER — LOPERAMIDE HCL 2 MG PO CAPS
4.0000 mg | ORAL_CAPSULE | Freq: Once | ORAL | Status: AC
  Administered 2021-08-13: 4 mg via ORAL
  Filled 2021-08-13: qty 2

## 2021-08-13 NOTE — ED Provider Notes (Addendum)
  Physical Exam  BP 111/81   Pulse 75   Temp 98.4 F (36.9 C) (Oral)   Resp 12   Ht 6\' 3"  (1.905 m)   Wt 63.5 kg   SpO2 95%   BMI 17.50 kg/m   Physical Exam  ED Course/Procedures     Procedures  MDM  Assuming care of patient from Dr. Roxanne Mins.   Patient in the ED for nausea and emesis with abd pain. Workup thus far shows: no acute changes.  Concerning findings are as following: none Important pending results are CT abd pelvis  According to Dr. Roxanne Mins, plan is to admit for pain control if the pain persists.   Patient had no complains, no concerns from the nursing side. Will continue to monitor.   Varney Biles, MD 08/13/21 0740   1:49 PM Pt reassessed. Pt's VSS and WNL. Pt's cap refill < 3 seconds. Pt has been hydrated in the ER and now passed po challenge. We will discharge with antiemetic. Strict ER return precautions have been discussed and pt will return if he is unable to tolerate fluids and symptoms are getting worse.  Given applesauce, pain medication and nausea medication.  Discussed the results of the CT scan and the lab work-up.  He is comfortable going home, will return if his symptoms get worse   Varney Biles, MD 08/13/21 1350

## 2021-08-13 NOTE — ED Notes (Signed)
This Probation officer did not see any good place to attempt to draw blood. Pt stated he would just wait to see if he has to get an IV to avoid being stuck multiple times.

## 2021-08-13 NOTE — ED Triage Notes (Addendum)
Pt BIB EMS from home nausea, vomiting, flank pain, and stool incontinence over the past week. Pt states that he has had diarrhea and is unable to keep anything down for a week.

## 2021-08-13 NOTE — Discharge Instructions (Signed)
We saw you in the ER for abdominal pain.  All the results in the ER are normal, labs and imaging. We suspect pain is due to chronic pancreatitis and maybe gastritis.  The workup in the ER is not complete, and is limited to screening for life threatening and emergent conditions only, so please see a primary care doctor for further evaluation.

## 2021-08-13 NOTE — ED Provider Notes (Signed)
[facility] DEPT Provider Note   CSN: [id number] Arrival date & time: 08/13/21  0000     History Chief Complaint  Patient presents with   Nausea   Emesis   stool incontinence    Flank Pain    Brent Rogers is a 55 y.o. male.  The history is provided by the patient.  Emesis Flank Pain He has history of hypertension, chronic pancreatitis and comes in complaining of nausea and vomiting for the last week.  He has not been able to hold anything down.  During this time, he also has diarrhea if he tries to eat anything.  He denies any blood in stool or emesis.  He denies fever but has had chills and sweats.  He feels generally weak and feels like he is unsteady on his feet when he tries to walk.  He has had some urge incontinence of stool.  He has been complaining of left upper quadrant pain with some radiation to the back consistent with his pancreatitis.  Pain is rated at 10/10.  Nothing makes it better, nothing makes it worse.  He has not been able to take anything because of his persistent nausea.  He states he has not had any alcohol in about 2 weeks.   Past Medical History:  Diagnosis Date   Benign tumor of endocrine pancreas    Chronic pancreatitis ([place])    S/P [place]   Foot ulcer with fat layer exposed ([place])    Hypertension    Migraine    "@ least once/month" (11/12/2015)   Pancreatic abnormality    CT  shows mass   Pneumonia 11/12/2015   Scoliosis     Patient Active Problem List   Diagnosis Date Noted   Major depressive disorder, recurrent episode, moderate ([place]) 03/24/2021   Open wound of pharynx without complication 78/58/8502   Atherosclerosis of native coronary artery of native heart without angina pectoris    Blood in stool    Iron deficiency anemia    Syncope 03/06/2021   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   Chronic pain syndrome 06/07/2019   Depression 06/07/2019   Chronic pancreatitis ([place]) 05/01/2017   Foot  callus 12/25/2015   Onychomycosis of toenail 12/25/2015   Fatty liver 11/12/2015   GERD (gastroesophageal reflux disease) 11/12/2015   HTN (hypertension) 07/10/2015   S/P cholecystectomy 07/10/2015   Tobacco abuse 12/26/2014   Protein-calorie malnutrition, severe ([place]) 11/20/2014   Exocrine pancreatic insufficiency ([place]) 12/23/2013    Past Surgical History:  Procedure Laterality Date   BACK SURGERY     COLONOSCOPY WITH PROPOFOL N/A 03/11/2021   Procedure: COLONOSCOPY WITH PROPOFOL;  Surgeon: Carol Ada, MD;  Location: [facility];  Service: Endoscopy;  Laterality: N/A;   ESOPHAGOGASTRODUODENOSCOPY (EGD) WITH PROPOFOL N/A 03/11/2021   Procedure: ESOPHAGOGASTRODUODENOSCOPY (EGD) WITH PROPOFOL;  Surgeon: Carol Ada, MD;  Location: [facility];  Service: Endoscopy;  Laterality: N/A;   EUS N/A 09/21/2013   Procedure: ESOPHAGEAL ENDOSCOPIC ULTRASOUND (EUS) RADIAL;  Surgeon: Beryle Beams, MD;  Location: [facility];  Service: Endoscopy;  Laterality: N/A;   EUS N/A 09/30/2013   Procedure: UPPER ENDOSCOPIC ULTRASOUND (EUS) LINEAR;  Surgeon: Beryle Beams, MD;  Location: [facility];  Service: Endoscopy;  Laterality: N/A;   FINE NEEDLE ASPIRATION N/A 09/21/2013   Procedure: FINE NEEDLE ASPIRATION (FNA) LINEAR;  Surgeon: Beryle Beams, MD;  [facility];  Service: Endoscopy;  Laterality: N/A;   FINGER FRACTURE SURGERY Left 1995   5th digit  FRACTURE SURGERY     HEMOSTASIS CLIP PLACEMENT  03/11/2021   Procedure: HEMOSTASIS CLIP PLACEMENT;  Surgeon: Carol Ada, MD;  Location: [facility];  Service: Endoscopy;;   HEMOSTASIS CONTROL  03/11/2021   Procedure: HEMOSTASIS CONTROL;  Surgeon: Carol Ada, MD;  Location: [facility];  Service: Endoscopy;;   KNEE ARTHROSCOPY Bilateral 1987-1989   right-left   LAPAROSCOPY N/A 12/01/2013   Procedure: LAPAROSCOPY DIAGNOSTIC PANCREATICODUODENECTOMY WITH BILIARY AND PANCREATIC STENTS;  Surgeon: Stark Klein, MD;  Location: [facility];   Service: General;  Laterality: N/A;   LUMBAR [place] SURGERY  1990's   SAVORY DILATION N/A 03/11/2021   Procedure: SAVORY DILATION;  Surgeon: Carol Ada, MD;  Location: [facility];  Service: Endoscopy;  Laterality: N/A;   WHIPPLE PROCEDURE  12/01/2013       Family History  Problem Relation Age of Onset   Cancer Mother        unsure   COPD Mother    Hypertension Mother    Hypertension Father    COPD Father    CAD Sister        possible has stent per pt    Social History   Tobacco Use   Smoking status: Every Day    Packs/day: 0.25    Years: 30.00    Pack years: 7.50    Types: Cigarettes   Smokeless tobacco: Former    Types: Snuff   Tobacco comments:    "used snuff 10-15 years in my 20s-30s"  Vaping Use   Vaping Use: Never used  Substance Use Topics   Alcohol use: Yes    Alcohol/week: 4.0 standard drinks    Types: 4 Cans of beer per week    Comment: weekly   Drug use: No    Home Medications Prior to Admission medications   Medication Sig Start Date End Date Taking? Authorizing Provider  acetaminophen (TYLENOL) 500 MG tablet Take 1,000 mg by mouth every 6 (six) hours as needed for mild pain.    [provider]  amLODipine (NORVASC) 5 MG tablet Take 1 tablet (5 mg total) by mouth daily. 03/21/21   Ladell Pier, MD  DULoxetine (CYMBALTA) 20 MG capsule Take 1 capsule (20 mg total) by mouth daily. 03/21/21   Ladell Pier, MD  famotidine (PEPCID) 20 MG tablet Take 1 tablet (20 mg total) by mouth 2 (two) times daily. Patient not taking: No sig reported 4/0/98   Campbell Stall P, DO  ferrous sulfate 325 (65 FE) MG tablet Take 1 tablet (325 mg total) by mouth daily. 03/16/21   Ladell Pier, MD  ondansetron (ZOFRAN) 4 MG tablet Take 1 tablet (4 mg total) by mouth every 6 (six) hours. Patient taking differently: Take 4 mg by mouth every 6 (six) hours as needed for nausea or vomiting. 09/29/89   Lianne Cure, DO  Pancrelipase, Lip-Prot-Amyl, (CREON)  24000-76000 units CPEP Take 1 capsule (24,000 Units total) by mouth with breakfast, with lunch, and with evening meal. 03/16/21   Ladell Pier, MD  pantoprazole (PROTONIX) 40 MG tablet Take 1 tablet (40 mg total) by mouth daily. 03/16/21   Ladell Pier, MD  promethazine (PHENERGAN) 25 MG tablet Take 1 tablet (25 mg total) by mouth every 6 (six) hours as needed for nausea or vomiting. 07/05/21   Daleen Bo, MD  sucralfate (CARAFATE) 1 GM/10ML suspension Take 10 mLs (1 g total) by mouth 4 (four) times daily -  with meals and at bedtime. Patient not taking: No sig  reported 04/04/27   Lianne Cure, DO    Allergies    Patient has no known allergies.  Review of Systems   Review of Systems  Gastrointestinal:  Positive for vomiting.  Genitourinary:  Positive for flank pain.  All other systems reviewed and are negative.  Physical Exam Updated Vital Signs BP 116/85 (BP Location: Left Arm)   Pulse 94   Temp 98.4 F (36.9 C) (Oral)   Resp 17   Ht 6\' 3"  (1.905 m)   Wt 63.5 kg   SpO2 100%   BMI 17.50 kg/m   Physical Exam Vitals and nursing note reviewed.  55 year old male, resting comfortably and in no acute distress. Vital signs are significant for slightly slow heart rate. Oxygen saturation is 100%, which is normal. Head is normocephalic and atraumatic. PERRLA, EOMI. Oropharynx is clear. Neck is nontender and supple without adenopathy or JVD. Back is nontender and there is no CVA tenderness. Lungs are clear without rales, wheezes, or rhonchi. Chest is nontender. Heart has regular rate and rhythm without murmur. Abdomen is soft, flat, with moderate tenderness across the upper abdomen.  There is no rebound or guarding.  There are no masses or hepatosplenomegaly.  Peristalsis is hypoactive. Extremities have no cyanosis or edema, full range of motion is present. Skin is warm and dry without rash. Neurologic: Mental status is normal, cranial nerves are intact, moves all  extremities equally.  ED Results / Procedures / Treatments   Labs (all labs ordered are listed, but only abnormal results are displayed) Labs Reviewed  CBC WITH DIFFERENTIAL/PLATELET - Abnormal; Notable for the following components:      Result Value   RBC 3.79 (*)    Hemoglobin 11.7 (*)    HCT 35.1 (*)    Abs Immature Granulocytes 0.08 (*)    All other components within normal limits  COMPREHENSIVE METABOLIC PANEL - Abnormal; Notable for the following components:   Creatinine, Ser 0.47 (*)    Calcium 8.2 (*)    Total Protein 5.8 (*)    Albumin 2.7 (*)    AST 72 (*)    Alkaline Phosphatase 143 (*)    All other components within normal limits  URINALYSIS, ROUTINE W REFLEX MICROSCOPIC - Abnormal; Notable for the following components:   Color, Urine AMBER (*)    APPearance HAZY (*)    Leukocytes,Ua TRACE (*)    Bacteria, UA RARE (*)    All other components within normal limits  RESP PANEL BY RT-PCR (FLU A&B, COVID) ARPGX2  LIPASE, BLOOD   Radiology No results found.  Procedures Procedures   Medications Ordered in ED Medications  ondansetron (ZOFRAN) injection 4 mg (4 mg Intravenous Given 08/13/21 0517)  lactated ringers bolus 1,000 mL (1,000 mLs Intravenous New Bag/Given 08/13/21 0519)  loperamide (IMODIUM) capsule 4 mg (4 mg Oral Given 08/13/21 0549)  morphine 4 MG/ML injection 4 mg (4 mg Intravenous Given 08/13/21 0525)  lactated ringers bolus 1,000 mL (1,000 mLs Intravenous Bolus 08/13/21 0731)  prochlorperazine (COMPAZINE) injection 10 mg (10 mg Intravenous Given 08/13/21 0732)  morphine 4 MG/ML injection 4 mg (4 mg Intravenous Given 08/13/21 0732)    ED Course  I have reviewed the triage vital signs and the nursing notes.  Pertinent labs & imaging results that were available during my care of the patient were reviewed by me and considered in my medical decision making (see chart for details).   MDM Rules/Calculators/A&P  Vomiting and  diarrhea of uncertain cause.  Consider viral gastroenteritis, bowel obstruction, exacerbation of pancreatitis.  Old records are reviewed, and he has numerous ED visits and hospitalizations for abdominal pain.  We will check screening labs and give IV fluids and ondansetron, morphine for pain.  Labs are not significantly changed from patient's baseline.  He states no improvement with above-noted treatment.  He is given additional fluids, additional morphine, and also prochlorperazine and will need to be reassessed.  At this point, I am concerned that he may need to be admitted for pain control.  CT of abdomen and pelvis is ordered.  Case is signed out to Dr. Kathrynn Humble.  Final Clinical Impression(s) / ED Diagnoses Final diagnoses:  Nausea and vomiting, unspecified vomiting type  Upper abdominal pain    Rx / DC Orders ED Discharge Orders     None        Delora Fuel, MD 97/94/80 [phone]

## 2021-08-24 ENCOUNTER — Emergency Department (HOSPITAL_COMMUNITY): Payer: Medicaid Other

## 2021-08-24 ENCOUNTER — Emergency Department (HOSPITAL_COMMUNITY)
Admission: EM | Admit: 2021-08-24 | Discharge: 2021-08-24 | Disposition: A | Discharge status: Home / Self Care | Payer: Medicaid Other | Attending: Emergency Medicine | Admitting: Emergency Medicine

## 2021-08-24 ENCOUNTER — Other Ambulatory Visit: Payer: Self-pay

## 2021-08-24 DIAGNOSIS — R197 Diarrhea, unspecified: Secondary | ICD-10-CM | POA: Diagnosis not present

## 2021-08-24 DIAGNOSIS — R112 Nausea with vomiting, unspecified: Secondary | ICD-10-CM

## 2021-08-24 DIAGNOSIS — Z79899 Other long term (current) drug therapy: Secondary | ICD-10-CM | POA: Insufficient documentation

## 2021-08-24 DIAGNOSIS — F17210 Nicotine dependence, cigarettes, uncomplicated: Secondary | ICD-10-CM | POA: Diagnosis not present

## 2021-08-24 DIAGNOSIS — S299XXA Unspecified injury of thorax, initial encounter: Secondary | ICD-10-CM | POA: Diagnosis not present

## 2021-08-24 DIAGNOSIS — W01198A Fall on same level from slipping, tripping and stumbling with subsequent striking against other object, initial encounter: Secondary | ICD-10-CM | POA: Diagnosis not present

## 2021-08-24 DIAGNOSIS — S0990XA Unspecified injury of head, initial encounter: Secondary | ICD-10-CM | POA: Diagnosis present

## 2021-08-24 DIAGNOSIS — S298XXA Other specified injuries of thorax, initial encounter: Secondary | ICD-10-CM

## 2021-08-24 DIAGNOSIS — I10 Essential (primary) hypertension: Secondary | ICD-10-CM | POA: Diagnosis not present

## 2021-08-24 DIAGNOSIS — I2510 Atherosclerotic heart disease of native coronary artery without angina pectoris: Secondary | ICD-10-CM | POA: Insufficient documentation

## 2021-08-24 DIAGNOSIS — S060X0A Concussion without loss of consciousness, initial encounter: Secondary | ICD-10-CM | POA: Diagnosis not present

## 2021-08-24 DIAGNOSIS — S8392XA Sprain of unspecified site of left knee, initial encounter: Secondary | ICD-10-CM | POA: Diagnosis not present

## 2021-08-24 DIAGNOSIS — R55 Syncope and collapse: Secondary | ICD-10-CM

## 2021-08-24 DIAGNOSIS — S0083XA Contusion of other part of head, initial encounter: Secondary | ICD-10-CM

## 2021-08-24 LAB — CBC WITH DIFFERENTIAL/PLATELET
Abs Immature Granulocytes: 0.11 10*3/uL — ABNORMAL HIGH (ref 0.00–0.07)
Basophils Absolute: 0.1 10*3/uL (ref 0.0–0.1)
Basophils Relative: 1 %
Eosinophils Absolute: 0 10*3/uL (ref 0.0–0.5)
Eosinophils Relative: 0 %
HCT: 36.4 % — ABNORMAL LOW (ref 39.0–52.0)
Hemoglobin: 12.3 g/dL — ABNORMAL LOW (ref 13.0–17.0)
Immature Granulocytes: 1 %
Lymphocytes Relative: 19 %
Lymphs Abs: 2.1 10*3/uL (ref 0.7–4.0)
MCH: 31.3 pg (ref 26.0–34.0)
MCHC: 33.8 g/dL (ref 30.0–36.0)
MCV: 92.6 fL (ref 80.0–100.0)
Monocytes Absolute: 1.1 10*3/uL — ABNORMAL HIGH (ref 0.1–1.0)
Monocytes Relative: 10 %
Neutro Abs: 8.1 10*3/uL — ABNORMAL HIGH (ref 1.7–7.7)
Neutrophils Relative %: 69 %
Platelets: 268 10*3/uL (ref 150–400)
RBC: 3.93 MIL/uL — ABNORMAL LOW (ref 4.22–5.81)
RDW: 14.4 % (ref 11.5–15.5)
WBC: 11.5 10*3/uL — ABNORMAL HIGH (ref 4.0–10.5)
nRBC: 0 % (ref 0.0–0.2)

## 2021-08-24 LAB — BASIC METABOLIC PANEL
Anion gap: 8 (ref 5–15)
BUN: 6 mg/dL (ref 6–20)
CO2: 28 mmol/L (ref 22–32)
Calcium: 8.3 mg/dL — ABNORMAL LOW (ref 8.9–10.3)
Chloride: 102 mmol/L (ref 98–111)
Creatinine, Ser: 0.71 mg/dL (ref 0.61–1.24)
GFR, Estimated: >60 mL/min (ref >60)
Glucose, Bld: 106 mg/dL — ABNORMAL HIGH (ref 70–99)
Potassium: 4.2 mmol/L (ref 3.5–5.1)
Sodium: 138 mmol/L (ref 135–145)

## 2021-08-24 MED ORDER — LACTATED RINGERS IV BOLUS
1000.0000 mL | Freq: Once | INTRAVENOUS | Status: AC
  Administered 2021-08-24: 1000 mL via INTRAVENOUS

## 2021-08-24 MED ORDER — FENTANYL CITRATE PF 50 MCG/ML IJ SOSY
50.0000 ug | PREFILLED_SYRINGE | Freq: Once | INTRAMUSCULAR | Status: AC
  Administered 2021-08-24: 50 ug via INTRAVENOUS
  Filled 2021-08-24: qty 1

## 2021-08-24 MED ORDER — KETOROLAC TROMETHAMINE 30 MG/ML IJ SOLN
30.0000 mg | Freq: Once | INTRAMUSCULAR | Status: AC
  Administered 2021-08-24: 30 mg via INTRAVENOUS
  Filled 2021-08-24: qty 1

## 2021-08-24 MED ORDER — ONDANSETRON HCL 4 MG/2ML IJ SOLN
4.0000 mg | Freq: Once | INTRAMUSCULAR | Status: AC
  Administered 2021-08-24: 4 mg via INTRAVENOUS
  Filled 2021-08-24: qty 2

## 2021-08-24 NOTE — ED Provider Notes (Signed)
[facility] EMERGENCY DEPARTMENT Provider Note   CSN: [id number] Arrival date & time: 08/24/21  0046     History Chief Complaint  Patient presents with   Marletta Lor    Brent Rogers is a 55 y.o. male.  The history is provided by the patient.  Fall This is a new problem. The current episode started 6 to 12 hours ago. The problem occurs constantly. The problem has been gradually worsening. Associated symptoms include headaches and shortness of breath. Pertinent negatives include no abdominal pain. Associated symptoms comments: Chest wall pain. The symptoms are aggravated by walking. The symptoms are relieved by rest.  Patient w/history of chronic pancreatitis s/p Whipple procedure Also has history of frequent episodes of vomiting diarrhea. Patient reports that earlier in the day he had an episode of lightheadedness and fell.  When he fell he struck his head & left side of his face and left knee.  He also reports chest wall pain after the fall.  He is not on anticoagulation.  No LOC. He reports multiple episodes of nonbloody emesis and diarrhea earlier in the day.  However he has had these episodes previously.   He reports blurred vision due to pain around the left eye from the fall Past Medical History:  Diagnosis Date   Benign tumor of endocrine pancreas    Chronic pancreatitis (HCC)    S/P Whipple   Foot ulcer with fat layer exposed (HCC)    Hypertension    Migraine    "@ least once/month" (11/12/2015)   Pancreatic abnormality    CT  shows mass   Pneumonia 11/12/2015   Scoliosis     Patient Active Problem List   Diagnosis Date Noted   Major depressive disorder, recurrent episode, moderate (HCC) 03/24/2021   Open wound of pharynx without complication 03/11/2021   Atherosclerosis of native coronary artery of native heart without angina pectoris    Blood in stool    Iron deficiency anemia    Syncope 03/06/2021   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    Chronic pain syndrome 06/07/2019   Depression 06/07/2019   Chronic pancreatitis (HCC) 05/01/2017   Foot callus 12/25/2015   Onychomycosis of toenail 12/25/2015   Fatty liver 11/12/2015   GERD (gastroesophageal reflux disease) 11/12/2015   HTN (hypertension) 07/10/2015   S/P cholecystectomy 07/10/2015   Tobacco abuse 12/26/2014   Protein-calorie malnutrition, severe (HCC) 11/20/2014   Exocrine pancreatic insufficiency (HCC) 12/23/2013    Past Surgical History:  Procedure Laterality Date   BACK SURGERY     COLONOSCOPY WITH PROPOFOL N/A 03/11/2021   Procedure: COLONOSCOPY WITH PROPOFOL;  Surgeon: Jeani Hawking, MD;  Location: [facility] ENDOSCOPY;  Service: Endoscopy;  Laterality: N/A;   ESOPHAGOGASTRODUODENOSCOPY (EGD) WITH PROPOFOL N/A 03/11/2021   Procedure: ESOPHAGOGASTRODUODENOSCOPY (EGD) WITH PROPOFOL;  Surgeon: Jeani Hawking, MD;  Location: [facility] ENDOSCOPY;  Service: Endoscopy;  Laterality: N/A;   EUS N/A 09/21/2013   Procedure: ESOPHAGEAL ENDOSCOPIC ULTRASOUND (EUS) RADIAL;  Surgeon: Theda Belfast, MD;  Location: [facility] ENDOSCOPY;  Service: Endoscopy;  Laterality: N/A;   EUS N/A 09/30/2013   Procedure: UPPER ENDOSCOPIC ULTRASOUND (EUS) LINEAR;  Surgeon: Theda Belfast, MD;  Location: [facility] ENDOSCOPY;  Service: Endoscopy;  Laterality: N/A;   FINE NEEDLE ASPIRATION N/A 09/21/2013   Procedure: FINE NEEDLE ASPIRATION (FNA) LINEAR;  Surgeon: Theda Belfast, MD;  Location: [facility] ENDOSCOPY;  Service: Endoscopy;  Laterality: N/A;   FINGER FRACTURE SURGERY Left 1995   5th digit   FRACTURE SURGERY  HEMOSTASIS CLIP PLACEMENT  03/11/2021   Procedure: HEMOSTASIS CLIP PLACEMENT;  Surgeon: Carol Ada, MD;  Location: [place];  Service: Endoscopy;;   HEMOSTASIS CONTROL  03/11/2021   Procedure: HEMOSTASIS CONTROL;  Surgeon: Carol Ada, MD;  Location: [place];  Service: Endoscopy;;   KNEE ARTHROSCOPY Bilateral 1987-1989   right-left   LAPAROSCOPY N/A 12/01/2013   Procedure: LAPAROSCOPY DIAGNOSTIC  PANCREATICODUODENECTOMY WITH BILIARY AND PANCREATIC STENTS;  Surgeon: Stark Klein, MD;  Location: [facility]S;  Service: General;  Laterality: N/A;   LUMBAR [place] SURGERY  1990's   SAVORY DILATION N/A 03/11/2021   Procedure: SAVORY DILATION;  Surgeon: Carol Ada, MD;  Location: [place];  Service: Endoscopy;  Laterality: N/A;   WHIPPLE PROCEDURE  12/01/2013       Family History  Problem Relation Age of Onset   Cancer Mother        unsure   COPD Mother    Hypertension Mother    Hypertension Father    COPD Father    CAD Sister        possible has stent per pt    Social History   Tobacco Use   Smoking status: Every Day    Packs/day: 0.25    Years: 30.00    Pack years: 7.50    Types: Cigarettes   Smokeless tobacco: Former    Types: Snuff   Tobacco comments:    "used snuff 10-15 years in my 20s-30s"  Vaping Use   Vaping Use: Never used  Substance Use Topics   Alcohol use: Yes    Alcohol/week: 4.0 standard drinks    Types: 4 Cans of beer per week    Comment: weekly   Drug use: No    Home Medications Prior to Admission medications   Medication Sig Start Date End Date Taking? Authorizing Provider  acetaminophen (TYLENOL) 500 MG tablet Take 1,000 mg by mouth every 6 (six) hours as needed for mild pain.   Yes [provider]  amLODipine (NORVASC) 5 MG tablet Take 1 tablet (5 mg total) by mouth daily. 03/21/21  Yes Ladell Pier, MD  DULoxetine (CYMBALTA) 20 MG capsule Take 1 capsule (20 mg total) by mouth daily. 03/21/21  Yes Ladell Pier, MD  famotidine (PEPCID) 20 MG tablet Take 1 tablet (20 mg total) by mouth 2 (two) times daily. Patient taking differently: Take 20 mg by mouth daily as needed for heartburn. 03/07/11  Yes Campbell Stall P, DO  ferrous sulfate 325 (65 FE) MG tablet Take 1 tablet (325 mg total) by mouth daily. Patient taking differently: Take 325 mg by mouth every other day. 03/16/21  Yes Ladell Pier, MD  ondansetron (ZOFRAN) 4 MG tablet  Take 1 tablet (4 mg total) by mouth every 6 (six) hours. Patient taking differently: Take 4 mg by mouth every 6 (six) hours as needed for nausea or vomiting. 04/02/16  Yes Campbell Stall P, DO  Pancrelipase, Lip-Prot-Amyl, (CREON) 24000-76000 units CPEP Take 1 capsule (24,000 Units total) by mouth with breakfast, with lunch, and with evening meal. Patient taking differently: Take 1 capsule by mouth 2 (two) times daily with a meal. 03/16/21  Yes Ladell Pier, MD  pantoprazole (PROTONIX) 40 MG tablet Take 1 tablet (40 mg total) by mouth daily. 03/16/21  Yes Ladell Pier, MD  promethazine (PHENERGAN) 25 MG tablet Take 1 tablet (25 mg total) by mouth every 6 (six) hours as needed for nausea or vomiting. 07/05/21  Yes Daleen Bo, MD  sucralfate ([place])  1 GM/10ML suspension Take 10 mLs (1 g total) by mouth 4 (four) times daily -  with meals and at bedtime. Patient not taking: Reported on 07/05/2021 01/29/78   Campbell Stall P, DO    Allergies    Patient has no known allergies.  Review of Systems   Review of Systems  Eyes:  Positive for visual disturbance.  Respiratory:  Positive for shortness of breath.   Gastrointestinal:  Positive for diarrhea, nausea and vomiting. Negative for abdominal pain and anal bleeding.  Musculoskeletal:  Positive for arthralgias.  Neurological:  Positive for light-headedness and headaches. Negative for syncope.  All other systems reviewed and are negative.  Physical Exam Updated Vital Signs BP (!) 120/91   Pulse 79   Temp 99.2 F (37.3 C) (Oral)   Resp 16   Ht 1.905 m ($Remove'6\' 3"'MsCPgXr$ )   Wt 68 kg   SpO2 96%   BMI 18.75 kg/m   Physical Exam CONSTITUTIONAL: Well developed/well nourished HEAD: Normocephalic/atraumatic EYES: EOMI/PERRL, no subconjunctival hemorrhage Tenderness and swelling noted around left orbit.  No crepitus.  No mandibular tenderness.  Face is otherwise stable.  Poor dentition at baseline ENMT: Mucous membranes moist NECK: supple no  meningeal signs SPINE/BACK:entire spine nontender, nexus criteria met. No bruising/crepitance/stepoffs noted to spine CV: S1/S2 noted, no murmurs/rubs/gallops noted LUNGS: Lungs are clear to auscultation bilaterally, no apparent distress Chest-diffuse left-sided tenderness, no crepitus ABDOMEN: soft, nontender GU:no cva tenderness NEURO: Pt is awake/alert/appropriate, moves all extremitiesx4.  No facial droop.  GCS 15 EXTREMITIES: pulses normal/equal, full ROM Tenderness and abrasion noted to left knee. all other extremities/joints palpated/ranged and nontender SKIN: warm, color normal PSYCH: no abnormalities of mood noted, alert and oriented to situation  ED Results / Procedures / Treatments   Labs (all labs ordered are listed, but only abnormal results are displayed) Labs Reviewed  BASIC METABOLIC PANEL - Abnormal; Notable for the following components:      Result Value   Glucose, Bld 106 (*)    Calcium 8.3 (*)    All other components within normal limits  CBC WITH DIFFERENTIAL/PLATELET - Abnormal; Notable for the following components:   WBC 11.5 (*)    RBC 3.93 (*)    Hemoglobin 12.3 (*)    HCT 36.4 (*)    Neutro Abs 8.1 (*)    Monocytes Absolute 1.1 (*)    Abs Immature Granulocytes 0.11 (*)    All other components within normal limits    EKG EKG Interpretation  Date/Time:  Saturday August 24 2021 04:39:59 EST Ventricular Rate:  70 PR Interval:  152 QRS Duration: 66 QT Interval:  426 QTC Calculation: 460 R Axis:   78 Text Interpretation: Sinus rhythm with Premature atrial complexes Otherwise normal ECG Confirmed by Ripley Fraise [phone]) on 08/24/2021 4:45:08 AM  Radiology DG Ribs Unilateral W/Chest Left  Result Date: 08/24/2021 CLINICAL DATA:  Dizziness and subsequent fall. EXAM: LEFT RIBS AND CHEST - 3+ VIEW COMPARISON:  None. FINDINGS: No acute fracture is seen involving the ribs. An ill-defined, chronic appearing seventh right rib deformity is noted.  There is no evidence of pneumothorax or pleural effusion. Both lungs are clear. Heart size and mediastinal contours are within normal limits. IMPRESSION: 1. No acute fracture or pneumothorax. 2. Chronic appearing seventh right rib deformity. Electronically Signed   By: Virgina Norfolk M.D.   On: 08/24/2021 02:05   CT Head Wo Contrast  Result Date: 08/24/2021 CLINICAL DATA:  Dizziness and subsequent fall. EXAM: CT HEAD WITHOUT CONTRAST TECHNIQUE:  Contiguous axial images were obtained from the base of the skull through the vertex without intravenous contrast. COMPARISON:  None. FINDINGS: Brain: There is mild cerebral atrophy with widening of the extra-axial spaces and ventricular dilatation. There are areas of decreased attenuation within the white matter tracts of the supratentorial brain, consistent with microvascular disease changes. Vascular: No hyperdense vessel or unexpected calcification. Skull: Normal. Negative for fracture or focal lesion. Sinuses/Orbits: No acute finding. Other: None. IMPRESSION: 1. Mild cerebral atrophy. 2. No acute intracranial abnormality. Electronically Signed   By: Virgina Norfolk M.D.   On: 08/24/2021 01:55   DG Knee Complete 4 Views Left  Result Date: 08/24/2021 CLINICAL DATA:  Dizziness and subsequent fall. EXAM: LEFT KNEE - COMPLETE 4+ VIEW COMPARISON:  None. FINDINGS: No evidence of an acute fracture or dislocation. Mild to moderate severity medial and lateral tibiofemoral compartment space narrowing is seen. A very small joint effusion is noted. IMPRESSION: 1. No acute fracture or dislocation. 2. Very small joint effusion. Electronically Signed   By: Virgina Norfolk M.D.   On: 08/24/2021 02:06   CT Maxillofacial Wo Contrast  Result Date: 08/24/2021 CLINICAL DATA:  Facial trauma. EXAM: CT MAXILLOFACIAL WITHOUT CONTRAST TECHNIQUE: Multidetector CT imaging of the maxillofacial structures was performed. Multiplanar CT image reconstructions were also generated.  COMPARISON:  Head CT dated 08/24/2021. FINDINGS: Osseous: No acute fracture. No mandibular subluxation. Extensive dental caries and periapical lucencies. Orbits: Negative. No traumatic or inflammatory finding. Sinuses: Clear. Soft tissues: Negative. Limited intracranial: No significant or unexpected finding. IMPRESSION: 1. No acute facial bone fracture. 2. Extensive dental caries and periapical lucencies. Electronically Signed   By: Anner Crete M.D.   On: 08/24/2021 01:57    Procedures Procedures   Medications Ordered in ED Medications  lactated ringers bolus 1,000 mL (1,000 mLs Intravenous New Bag/Given 08/24/21 0318)  ondansetron (ZOFRAN) injection 4 mg (4 mg Intravenous Given 08/24/21 0318)  fentaNYL (SUBLIMAZE) injection 50 mcg (50 mcg Intravenous Given 08/24/21 0318)  ketorolac (TORADOL) 30 MG/ML injection 30 mg (30 mg Intravenous Given 08/24/21 0346)    ED Course  I have reviewed the triage vital signs and the nursing notes.  Pertinent labs & imaging results that were available during my care of the patient were reviewed by me and considered in my medical decision making (see chart for details).    MDM Rules/Calculators/A&P                           Pt with h/o chronic pancreatitis H/o frequent episodes of vomiting/diarrhea He reports near syncopal event and fall earlier All trauma imaging negative (personally reviewed imaging) No changes in orthostatic vitals Labs overall reassuring He has had extensive workup previously for similar near syncopal episode including echo that was unremarkable Pt now taking fluids/crackers Will D/C home  Final Clinical Impression(s) / ED Diagnoses Final diagnoses:  Nausea vomiting and diarrhea  Near syncope  Blunt trauma to chest, initial encounter  Concussion without loss of consciousness, initial encounter  Contusion of face, initial encounter  Sprain of left knee, unspecified ligament, initial encounter    Rx / DC Orders ED  Discharge Orders     None        Ripley Fraise, MD 08/24/21 0502

## 2021-08-24 NOTE — ED Notes (Signed)
Pt discharged and ambulated out of the ED without difficulty. 

## 2021-08-24 NOTE — ED Triage Notes (Signed)
Pt was at home with nausea and vomiting that's been ongoing for a day. Pt then became dizzy, lost footing and fell. Pt endorses chronic dizzy spells. Pt states that he's having left knee pain, has headache as he hit his head as well as blurred vision to the left eye. Pt not on blood thinners, and per patient, no LOC. VSS per medic: 142/86; HR 84; RR: 14; SpO2: 99%.

## 2021-09-02 ENCOUNTER — Encounter (HOSPITAL_COMMUNITY): Payer: Self-pay

## 2021-09-02 ENCOUNTER — Emergency Department (EMERGENCY_DEPARTMENT_HOSPITAL)
Admission: EM | Admit: 2021-09-02 | Discharge: 2021-09-03 | Disposition: A | Discharge status: Other Acute Inpatient Hospital | Payer: Medicaid Other | Source: Home / Self Care | Attending: Emergency Medicine | Admitting: Emergency Medicine

## 2021-09-02 ENCOUNTER — Emergency Department (HOSPITAL_BASED_OUTPATIENT_CLINIC_OR_DEPARTMENT_OTHER)
Admit: 2021-09-02 | Discharge: 2021-09-02 | Disposition: A | Discharge status: Home / Self Care | Payer: Medicaid Other | Attending: Emergency Medicine | Admitting: Emergency Medicine

## 2021-09-02 ENCOUNTER — Emergency Department (HOSPITAL_COMMUNITY): Payer: Medicaid Other

## 2021-09-02 ENCOUNTER — Other Ambulatory Visit: Payer: Self-pay

## 2021-09-02 DIAGNOSIS — R112 Nausea with vomiting, unspecified: Secondary | ICD-10-CM | POA: Insufficient documentation

## 2021-09-02 DIAGNOSIS — M7989 Other specified soft tissue disorders: Secondary | ICD-10-CM

## 2021-09-02 DIAGNOSIS — Z20822 Contact with and (suspected) exposure to covid-19: Secondary | ICD-10-CM | POA: Insufficient documentation

## 2021-09-02 DIAGNOSIS — F332 Major depressive disorder, recurrent severe without psychotic features: Secondary | ICD-10-CM | POA: Insufficient documentation

## 2021-09-02 DIAGNOSIS — I2510 Atherosclerotic heart disease of native coronary artery without angina pectoris: Secondary | ICD-10-CM | POA: Insufficient documentation

## 2021-09-02 DIAGNOSIS — R1084 Generalized abdominal pain: Secondary | ICD-10-CM | POA: Insufficient documentation

## 2021-09-02 DIAGNOSIS — F32A Depression, unspecified: Secondary | ICD-10-CM

## 2021-09-02 DIAGNOSIS — Z79899 Other long term (current) drug therapy: Secondary | ICD-10-CM | POA: Insufficient documentation

## 2021-09-02 DIAGNOSIS — R197 Diarrhea, unspecified: Secondary | ICD-10-CM | POA: Insufficient documentation

## 2021-09-02 DIAGNOSIS — Y900 Blood alcohol level of less than 20 mg/100 ml: Secondary | ICD-10-CM | POA: Insufficient documentation

## 2021-09-02 DIAGNOSIS — I10 Essential (primary) hypertension: Secondary | ICD-10-CM | POA: Insufficient documentation

## 2021-09-02 DIAGNOSIS — F17210 Nicotine dependence, cigarettes, uncomplicated: Secondary | ICD-10-CM | POA: Insufficient documentation

## 2021-09-02 LAB — CBC WITH DIFFERENTIAL/PLATELET
Abs Immature Granulocytes: 0.05 10*3/uL (ref 0.00–0.07)
Basophils Absolute: 0.1 10*3/uL (ref 0.0–0.1)
Basophils Relative: 1 %
Eosinophils Absolute: 0.1 10*3/uL (ref 0.0–0.5)
Eosinophils Relative: 1 %
HCT: 36.8 % — ABNORMAL LOW (ref 39.0–52.0)
Hemoglobin: 12.1 g/dL — ABNORMAL LOW (ref 13.0–17.0)
Immature Granulocytes: 1 %
Lymphocytes Relative: 24 %
Lymphs Abs: 1.7 10*3/uL (ref 0.7–4.0)
MCH: 30.7 pg (ref 26.0–34.0)
MCHC: 32.9 g/dL (ref 30.0–36.0)
MCV: 93.4 fL (ref 80.0–100.0)
Monocytes Absolute: 0.6 10*3/uL (ref 0.1–1.0)
Monocytes Relative: 9 %
Neutro Abs: 4.6 10*3/uL (ref 1.7–7.7)
Neutrophils Relative %: 64 %
Platelets: 253 10*3/uL (ref 150–400)
RBC: 3.94 MIL/uL — ABNORMAL LOW (ref 4.22–5.81)
RDW: 14.3 % (ref 11.5–15.5)
WBC: 7.2 10*3/uL (ref 4.0–10.5)
nRBC: 0 % (ref 0.0–0.2)

## 2021-09-02 LAB — RAPID URINE DRUG SCREEN, HOSP PERFORMED
Amphetamines: NOT DETECTED
Barbiturates: NOT DETECTED
Benzodiazepines: NOT DETECTED
Cocaine: NOT DETECTED
Opiates: POSITIVE — AB
Tetrahydrocannabinol: NOT DETECTED

## 2021-09-02 LAB — COMPREHENSIVE METABOLIC PANEL
ALT: 42 U/L (ref 0–44)
AST: 117 U/L — ABNORMAL HIGH (ref 15–41)
Albumin: 2.8 g/dL — ABNORMAL LOW (ref 3.5–5.0)
Alkaline Phosphatase: 154 U/L — ABNORMAL HIGH (ref 38–126)
Anion gap: 9 (ref 5–15)
BUN: <5 mg/dL — ABNORMAL LOW (ref 6–20)
CO2: 22 mmol/L (ref 22–32)
Calcium: 8 mg/dL — ABNORMAL LOW (ref 8.9–10.3)
Chloride: 106 mmol/L (ref 98–111)
Creatinine, Ser: 0.61 mg/dL (ref 0.61–1.24)
GFR, Estimated: >60 mL/min (ref >60)
Glucose, Bld: 180 mg/dL — ABNORMAL HIGH (ref 70–99)
Potassium: 3.2 mmol/L — ABNORMAL LOW (ref 3.5–5.1)
Sodium: 137 mmol/L (ref 135–145)
Total Bilirubin: 0.5 mg/dL (ref 0.3–1.2)
Total Protein: 6 g/dL — ABNORMAL LOW (ref 6.5–8.1)

## 2021-09-02 LAB — URINALYSIS, ROUTINE W REFLEX MICROSCOPIC
Bilirubin Urine: NEGATIVE
Glucose, UA: NEGATIVE mg/dL
Hgb urine dipstick: NEGATIVE
Ketones, ur: NEGATIVE mg/dL
Leukocytes,Ua: NEGATIVE
Nitrite: NEGATIVE
Protein, ur: NEGATIVE mg/dL
Specific Gravity, Urine: 1.011 (ref 1.005–1.030)
pH: 6 (ref 5.0–8.0)

## 2021-09-02 LAB — RESP PANEL BY RT-PCR (FLU A&B, COVID) ARPGX2
Influenza A by PCR: NEGATIVE
Influenza B by PCR: NEGATIVE
SARS Coronavirus 2 by RT PCR: NEGATIVE

## 2021-09-02 LAB — LIPASE, BLOOD: Lipase: 19 U/L (ref 11–51)

## 2021-09-02 LAB — ETHANOL: Alcohol, Ethyl (B): <10 mg/dL (ref <10)

## 2021-09-02 MED ORDER — MORPHINE SULFATE (PF) 4 MG/ML IV SOLN
4.0000 mg | Freq: Once | INTRAVENOUS | Status: AC
  Administered 2021-09-02: 4 mg via INTRAVENOUS
  Filled 2021-09-02: qty 1

## 2021-09-02 MED ORDER — SODIUM CHLORIDE 0.9 % IV BOLUS
1000.0000 mL | Freq: Once | INTRAVENOUS | Status: AC
  Administered 2021-09-02: 1000 mL via INTRAVENOUS

## 2021-09-02 MED ORDER — POTASSIUM CHLORIDE 10 MEQ/100ML IV SOLN
10.0000 meq | Freq: Once | INTRAVENOUS | Status: AC
  Administered 2021-09-02: 10 meq via INTRAVENOUS
  Filled 2021-09-02: qty 100

## 2021-09-02 MED ORDER — ONDANSETRON HCL 4 MG/2ML IJ SOLN
4.0000 mg | Freq: Once | INTRAMUSCULAR | Status: AC
  Administered 2021-09-02: 4 mg via INTRAVENOUS
  Filled 2021-09-02: qty 2

## 2021-09-02 NOTE — ED Provider Notes (Signed)
Alkaline Phosphatase 154 (*)    All other components within normal limits  RAPID URINE DRUG SCREEN, HOSP PERFORMED - Abnormal; Notable for the following components:   Opiates POSITIVE (*)     All other components within normal limits  RESP PANEL BY RT-PCR (FLU A&B, COVID) ARPGX2  LIPASE, BLOOD  URINALYSIS, ROUTINE W REFLEX MICROSCOPIC  ETHANOL    EKG None  Radiology DG Ankle Complete Left  Result Date: 09/02/2021 CLINICAL DATA:  Left foot and ankle pain and swelling for 1 week. No known injury. EXAM: LEFT FOOT - COMPLETE 3+ VIEW; LEFT ANKLE COMPLETE - 3+ VIEW COMPARISON:  X-ray 03/07/2016 FINDINGS: There is no evidence of fracture or dislocation. There is no evidence of significant arthropathy or other focal bone abnormality. Mild diffuse soft tissue swelling at the ankle and dorsal forefoot. Atherosclerotic vascular calcifications are present. IMPRESSION: Mild soft tissue swelling without acute osseous abnormality. Electronically Signed   By: Davina Poke D.O.   On: 09/02/2021 08:15   DG Foot Complete Left  Result Date: 09/02/2021 CLINICAL DATA:  Left foot and ankle pain and swelling for 1 week. No known injury. EXAM: LEFT FOOT - COMPLETE 3+ VIEW; LEFT ANKLE COMPLETE - 3+ VIEW COMPARISON:  X-ray 03/07/2016 FINDINGS: There is no evidence of fracture or dislocation. There is no evidence of significant arthropathy or other focal bone abnormality. Mild diffuse soft tissue swelling at the ankle and dorsal forefoot. Atherosclerotic vascular calcifications are present. IMPRESSION: Mild soft tissue swelling without acute osseous abnormality. Electronically Signed   By: Davina Poke D.O.   On: 09/02/2021 08:15   VAS [place] LOWER EXTREMITY VENOUS (DVT) (7a-7p)  Result Date: 09/02/2021  Lower Venous DVT Study Patient Name:  Brent Rogers  Date of Exam:   09/02/2021 Medical Rec #: [id number]         Accession #:    [id number] Date of Birth: [birth year]          Patient Gender: M Patient Age:   55 years Exam Location:  [facility] Procedure:      VAS [place] LOWER EXTREMITY VENOUS (DVT) Referring Phys: Alaric Gladwin  --------------------------------------------------------------------------------  Indications: Swelling.  Risk Factors: None identified. Comparison Study: No prior studies. Performing Technologist: Oliver Hum RVT  Examination Guidelines: A complete evaluation includes B-mode imaging, spectral Doppler, color Doppler, and power Doppler as needed of all accessible portions of each vessel. Bilateral testing is considered an integral part of a complete examination. Limited examinations for reoccurring indications may be performed as noted. The reflux portion of the exam is performed with the patient in reverse Trendelenburg.  +-----+---------------+---------+-----------+----------+--------------+ RIGHTCompressibilityPhasicitySpontaneityPropertiesThrombus Aging +-----+---------------+---------+-----------+----------+--------------+ CFV  Full           Yes      Yes                                 +-----+---------------+---------+-----------+----------+--------------+   +---------+---------------+---------+-----------+----------+--------------+ LEFT     CompressibilityPhasicitySpontaneityPropertiesThrombus Aging +---------+---------------+---------+-----------+----------+--------------+ CFV      Full           Yes      Yes                                 +---------+---------------+---------+-----------+----------+--------------+ SFJ      Full                                                        +---------+---------------+---------+-----------+----------+--------------+  Alkaline Phosphatase 154 (*)    All other components within normal limits  RAPID URINE DRUG SCREEN, HOSP PERFORMED - Abnormal; Notable for the following components:   Opiates POSITIVE (*)     All other components within normal limits  RESP PANEL BY RT-PCR (FLU A&B, COVID) ARPGX2  LIPASE, BLOOD  URINALYSIS, ROUTINE W REFLEX MICROSCOPIC  ETHANOL    EKG None  Radiology DG Ankle Complete Left  Result Date: 09/02/2021 CLINICAL DATA:  Left foot and ankle pain and swelling for 1 week. No known injury. EXAM: LEFT FOOT - COMPLETE 3+ VIEW; LEFT ANKLE COMPLETE - 3+ VIEW COMPARISON:  X-ray 03/07/2016 FINDINGS: There is no evidence of fracture or dislocation. There is no evidence of significant arthropathy or other focal bone abnormality. Mild diffuse soft tissue swelling at the ankle and dorsal forefoot. Atherosclerotic vascular calcifications are present. IMPRESSION: Mild soft tissue swelling without acute osseous abnormality. Electronically Signed   By: Davina Poke D.O.   On: 09/02/2021 08:15   DG Foot Complete Left  Result Date: 09/02/2021 CLINICAL DATA:  Left foot and ankle pain and swelling for 1 week. No known injury. EXAM: LEFT FOOT - COMPLETE 3+ VIEW; LEFT ANKLE COMPLETE - 3+ VIEW COMPARISON:  X-ray 03/07/2016 FINDINGS: There is no evidence of fracture or dislocation. There is no evidence of significant arthropathy or other focal bone abnormality. Mild diffuse soft tissue swelling at the ankle and dorsal forefoot. Atherosclerotic vascular calcifications are present. IMPRESSION: Mild soft tissue swelling without acute osseous abnormality. Electronically Signed   By: Davina Poke D.O.   On: 09/02/2021 08:15   VAS [place] LOWER EXTREMITY VENOUS (DVT) (7a-7p)  Result Date: 09/02/2021  Lower Venous DVT Study Patient Name:  Brent Rogers  Date of Exam:   09/02/2021 Medical Rec #: [id number]         Accession #:    [id number] Date of Birth: [birth year]          Patient Gender: M Patient Age:   55 years Exam Location:  [facility] Procedure:      VAS [place] LOWER EXTREMITY VENOUS (DVT) Referring Phys: Alaric Gladwin  --------------------------------------------------------------------------------  Indications: Swelling.  Risk Factors: None identified. Comparison Study: No prior studies. Performing Technologist: Oliver Hum RVT  Examination Guidelines: A complete evaluation includes B-mode imaging, spectral Doppler, color Doppler, and power Doppler as needed of all accessible portions of each vessel. Bilateral testing is considered an integral part of a complete examination. Limited examinations for reoccurring indications may be performed as noted. The reflux portion of the exam is performed with the patient in reverse Trendelenburg.  +-----+---------------+---------+-----------+----------+--------------+ RIGHTCompressibilityPhasicitySpontaneityPropertiesThrombus Aging +-----+---------------+---------+-----------+----------+--------------+ CFV  Full           Yes      Yes                                 +-----+---------------+---------+-----------+----------+--------------+   +---------+---------------+---------+-----------+----------+--------------+ LEFT     CompressibilityPhasicitySpontaneityPropertiesThrombus Aging +---------+---------------+---------+-----------+----------+--------------+ CFV      Full           Yes      Yes                                 +---------+---------------+---------+-----------+----------+--------------+ SFJ      Full                                                        +---------+---------------+---------+-----------+----------+--------------+  Alkaline Phosphatase 154 (*)    All other components within normal limits  RAPID URINE DRUG SCREEN, HOSP PERFORMED - Abnormal; Notable for the following components:   Opiates POSITIVE (*)     All other components within normal limits  RESP PANEL BY RT-PCR (FLU A&B, COVID) ARPGX2  LIPASE, BLOOD  URINALYSIS, ROUTINE W REFLEX MICROSCOPIC  ETHANOL    EKG None  Radiology DG Ankle Complete Left  Result Date: 09/02/2021 CLINICAL DATA:  Left foot and ankle pain and swelling for 1 week. No known injury. EXAM: LEFT FOOT - COMPLETE 3+ VIEW; LEFT ANKLE COMPLETE - 3+ VIEW COMPARISON:  X-ray 03/07/2016 FINDINGS: There is no evidence of fracture or dislocation. There is no evidence of significant arthropathy or other focal bone abnormality. Mild diffuse soft tissue swelling at the ankle and dorsal forefoot. Atherosclerotic vascular calcifications are present. IMPRESSION: Mild soft tissue swelling without acute osseous abnormality. Electronically Signed   By: Davina Poke D.O.   On: 09/02/2021 08:15   DG Foot Complete Left  Result Date: 09/02/2021 CLINICAL DATA:  Left foot and ankle pain and swelling for 1 week. No known injury. EXAM: LEFT FOOT - COMPLETE 3+ VIEW; LEFT ANKLE COMPLETE - 3+ VIEW COMPARISON:  X-ray 03/07/2016 FINDINGS: There is no evidence of fracture or dislocation. There is no evidence of significant arthropathy or other focal bone abnormality. Mild diffuse soft tissue swelling at the ankle and dorsal forefoot. Atherosclerotic vascular calcifications are present. IMPRESSION: Mild soft tissue swelling without acute osseous abnormality. Electronically Signed   By: Davina Poke D.O.   On: 09/02/2021 08:15   VAS [place] LOWER EXTREMITY VENOUS (DVT) (7a-7p)  Result Date: 09/02/2021  Lower Venous DVT Study Patient Name:  Brent Rogers  Date of Exam:   09/02/2021 Medical Rec #: [id number]         Accession #:    [id number] Date of Birth: [birth year]          Patient Gender: M Patient Age:   55 years Exam Location:  [facility] Procedure:      VAS [place] LOWER EXTREMITY VENOUS (DVT) Referring Phys: Alaric Gladwin  --------------------------------------------------------------------------------  Indications: Swelling.  Risk Factors: None identified. Comparison Study: No prior studies. Performing Technologist: Oliver Hum RVT  Examination Guidelines: A complete evaluation includes B-mode imaging, spectral Doppler, color Doppler, and power Doppler as needed of all accessible portions of each vessel. Bilateral testing is considered an integral part of a complete examination. Limited examinations for reoccurring indications may be performed as noted. The reflux portion of the exam is performed with the patient in reverse Trendelenburg.  +-----+---------------+---------+-----------+----------+--------------+ RIGHTCompressibilityPhasicitySpontaneityPropertiesThrombus Aging +-----+---------------+---------+-----------+----------+--------------+ CFV  Full           Yes      Yes                                 +-----+---------------+---------+-----------+----------+--------------+   +---------+---------------+---------+-----------+----------+--------------+ LEFT     CompressibilityPhasicitySpontaneityPropertiesThrombus Aging +---------+---------------+---------+-----------+----------+--------------+ CFV      Full           Yes      Yes                                 +---------+---------------+---------+-----------+----------+--------------+ SFJ      Full                                                        +---------+---------------+---------+-----------+----------+--------------+  Alkaline Phosphatase 154 (*)    All other components within normal limits  RAPID URINE DRUG SCREEN, HOSP PERFORMED - Abnormal; Notable for the following components:   Opiates POSITIVE (*)     All other components within normal limits  RESP PANEL BY RT-PCR (FLU A&B, COVID) ARPGX2  LIPASE, BLOOD  URINALYSIS, ROUTINE W REFLEX MICROSCOPIC  ETHANOL    EKG None  Radiology DG Ankle Complete Left  Result Date: 09/02/2021 CLINICAL DATA:  Left foot and ankle pain and swelling for 1 week. No known injury. EXAM: LEFT FOOT - COMPLETE 3+ VIEW; LEFT ANKLE COMPLETE - 3+ VIEW COMPARISON:  X-ray 03/07/2016 FINDINGS: There is no evidence of fracture or dislocation. There is no evidence of significant arthropathy or other focal bone abnormality. Mild diffuse soft tissue swelling at the ankle and dorsal forefoot. Atherosclerotic vascular calcifications are present. IMPRESSION: Mild soft tissue swelling without acute osseous abnormality. Electronically Signed   By: Davina Poke D.O.   On: 09/02/2021 08:15   DG Foot Complete Left  Result Date: 09/02/2021 CLINICAL DATA:  Left foot and ankle pain and swelling for 1 week. No known injury. EXAM: LEFT FOOT - COMPLETE 3+ VIEW; LEFT ANKLE COMPLETE - 3+ VIEW COMPARISON:  X-ray 03/07/2016 FINDINGS: There is no evidence of fracture or dislocation. There is no evidence of significant arthropathy or other focal bone abnormality. Mild diffuse soft tissue swelling at the ankle and dorsal forefoot. Atherosclerotic vascular calcifications are present. IMPRESSION: Mild soft tissue swelling without acute osseous abnormality. Electronically Signed   By: Davina Poke D.O.   On: 09/02/2021 08:15   VAS [place] LOWER EXTREMITY VENOUS (DVT) (7a-7p)  Result Date: 09/02/2021  Lower Venous DVT Study Patient Name:  Brent Rogers  Date of Exam:   09/02/2021 Medical Rec #: [id number]         Accession #:    [id number] Date of Birth: [birth year]          Patient Gender: M Patient Age:   55 years Exam Location:  [facility] Procedure:      VAS [place] LOWER EXTREMITY VENOUS (DVT) Referring Phys: Alaric Gladwin  --------------------------------------------------------------------------------  Indications: Swelling.  Risk Factors: None identified. Comparison Study: No prior studies. Performing Technologist: Oliver Hum RVT  Examination Guidelines: A complete evaluation includes B-mode imaging, spectral Doppler, color Doppler, and power Doppler as needed of all accessible portions of each vessel. Bilateral testing is considered an integral part of a complete examination. Limited examinations for reoccurring indications may be performed as noted. The reflux portion of the exam is performed with the patient in reverse Trendelenburg.  +-----+---------------+---------+-----------+----------+--------------+ RIGHTCompressibilityPhasicitySpontaneityPropertiesThrombus Aging +-----+---------------+---------+-----------+----------+--------------+ CFV  Full           Yes      Yes                                 +-----+---------------+---------+-----------+----------+--------------+   +---------+---------------+---------+-----------+----------+--------------+ LEFT     CompressibilityPhasicitySpontaneityPropertiesThrombus Aging +---------+---------------+---------+-----------+----------+--------------+ CFV      Full           Yes      Yes                                 +---------+---------------+---------+-----------+----------+--------------+ SFJ      Full                                                        +---------+---------------+---------+-----------+----------+--------------+  Alkaline Phosphatase 154 (*)    All other components within normal limits  RAPID URINE DRUG SCREEN, HOSP PERFORMED - Abnormal; Notable for the following components:   Opiates POSITIVE (*)     All other components within normal limits  RESP PANEL BY RT-PCR (FLU A&B, COVID) ARPGX2  LIPASE, BLOOD  URINALYSIS, ROUTINE W REFLEX MICROSCOPIC  ETHANOL    EKG None  Radiology DG Ankle Complete Left  Result Date: 09/02/2021 CLINICAL DATA:  Left foot and ankle pain and swelling for 1 week. No known injury. EXAM: LEFT FOOT - COMPLETE 3+ VIEW; LEFT ANKLE COMPLETE - 3+ VIEW COMPARISON:  X-ray 03/07/2016 FINDINGS: There is no evidence of fracture or dislocation. There is no evidence of significant arthropathy or other focal bone abnormality. Mild diffuse soft tissue swelling at the ankle and dorsal forefoot. Atherosclerotic vascular calcifications are present. IMPRESSION: Mild soft tissue swelling without acute osseous abnormality. Electronically Signed   By: Davina Poke D.O.   On: 09/02/2021 08:15   DG Foot Complete Left  Result Date: 09/02/2021 CLINICAL DATA:  Left foot and ankle pain and swelling for 1 week. No known injury. EXAM: LEFT FOOT - COMPLETE 3+ VIEW; LEFT ANKLE COMPLETE - 3+ VIEW COMPARISON:  X-ray 03/07/2016 FINDINGS: There is no evidence of fracture or dislocation. There is no evidence of significant arthropathy or other focal bone abnormality. Mild diffuse soft tissue swelling at the ankle and dorsal forefoot. Atherosclerotic vascular calcifications are present. IMPRESSION: Mild soft tissue swelling without acute osseous abnormality. Electronically Signed   By: Davina Poke D.O.   On: 09/02/2021 08:15   VAS [place] LOWER EXTREMITY VENOUS (DVT) (7a-7p)  Result Date: 09/02/2021  Lower Venous DVT Study Patient Name:  Brent Rogers  Date of Exam:   09/02/2021 Medical Rec #: [id number]         Accession #:    [id number] Date of Birth: [birth year]          Patient Gender: M Patient Age:   55 years Exam Location:  [facility] Procedure:      VAS [place] LOWER EXTREMITY VENOUS (DVT) Referring Phys: Alaric Gladwin  --------------------------------------------------------------------------------  Indications: Swelling.  Risk Factors: None identified. Comparison Study: No prior studies. Performing Technologist: Oliver Hum RVT  Examination Guidelines: A complete evaluation includes B-mode imaging, spectral Doppler, color Doppler, and power Doppler as needed of all accessible portions of each vessel. Bilateral testing is considered an integral part of a complete examination. Limited examinations for reoccurring indications may be performed as noted. The reflux portion of the exam is performed with the patient in reverse Trendelenburg.  +-----+---------------+---------+-----------+----------+--------------+ RIGHTCompressibilityPhasicitySpontaneityPropertiesThrombus Aging +-----+---------------+---------+-----------+----------+--------------+ CFV  Full           Yes      Yes                                 +-----+---------------+---------+-----------+----------+--------------+   +---------+---------------+---------+-----------+----------+--------------+ LEFT     CompressibilityPhasicitySpontaneityPropertiesThrombus Aging +---------+---------------+---------+-----------+----------+--------------+ CFV      Full           Yes      Yes                                 +---------+---------------+---------+-----------+----------+--------------+ SFJ      Full                                                        +---------+---------------+---------+-----------+----------+--------------+  Alkaline Phosphatase 154 (*)    All other components within normal limits  RAPID URINE DRUG SCREEN, HOSP PERFORMED - Abnormal; Notable for the following components:   Opiates POSITIVE (*)     All other components within normal limits  RESP PANEL BY RT-PCR (FLU A&B, COVID) ARPGX2  LIPASE, BLOOD  URINALYSIS, ROUTINE W REFLEX MICROSCOPIC  ETHANOL    EKG None  Radiology DG Ankle Complete Left  Result Date: 09/02/2021 CLINICAL DATA:  Left foot and ankle pain and swelling for 1 week. No known injury. EXAM: LEFT FOOT - COMPLETE 3+ VIEW; LEFT ANKLE COMPLETE - 3+ VIEW COMPARISON:  X-ray 03/07/2016 FINDINGS: There is no evidence of fracture or dislocation. There is no evidence of significant arthropathy or other focal bone abnormality. Mild diffuse soft tissue swelling at the ankle and dorsal forefoot. Atherosclerotic vascular calcifications are present. IMPRESSION: Mild soft tissue swelling without acute osseous abnormality. Electronically Signed   By: Davina Poke D.O.   On: 09/02/2021 08:15   DG Foot Complete Left  Result Date: 09/02/2021 CLINICAL DATA:  Left foot and ankle pain and swelling for 1 week. No known injury. EXAM: LEFT FOOT - COMPLETE 3+ VIEW; LEFT ANKLE COMPLETE - 3+ VIEW COMPARISON:  X-ray 03/07/2016 FINDINGS: There is no evidence of fracture or dislocation. There is no evidence of significant arthropathy or other focal bone abnormality. Mild diffuse soft tissue swelling at the ankle and dorsal forefoot. Atherosclerotic vascular calcifications are present. IMPRESSION: Mild soft tissue swelling without acute osseous abnormality. Electronically Signed   By: Davina Poke D.O.   On: 09/02/2021 08:15   VAS [place] LOWER EXTREMITY VENOUS (DVT) (7a-7p)  Result Date: 09/02/2021  Lower Venous DVT Study Patient Name:  Brent Rogers  Date of Exam:   09/02/2021 Medical Rec #: [id number]         Accession #:    [id number] Date of Birth: [birth year]          Patient Gender: M Patient Age:   55 years Exam Location:  [facility] Procedure:      VAS [place] LOWER EXTREMITY VENOUS (DVT) Referring Phys: Alaric Gladwin  --------------------------------------------------------------------------------  Indications: Swelling.  Risk Factors: None identified. Comparison Study: No prior studies. Performing Technologist: Oliver Hum RVT  Examination Guidelines: A complete evaluation includes B-mode imaging, spectral Doppler, color Doppler, and power Doppler as needed of all accessible portions of each vessel. Bilateral testing is considered an integral part of a complete examination. Limited examinations for reoccurring indications may be performed as noted. The reflux portion of the exam is performed with the patient in reverse Trendelenburg.  +-----+---------------+---------+-----------+----------+--------------+ RIGHTCompressibilityPhasicitySpontaneityPropertiesThrombus Aging +-----+---------------+---------+-----------+----------+--------------+ CFV  Full           Yes      Yes                                 +-----+---------------+---------+-----------+----------+--------------+   +---------+---------------+---------+-----------+----------+--------------+ LEFT     CompressibilityPhasicitySpontaneityPropertiesThrombus Aging +---------+---------------+---------+-----------+----------+--------------+ CFV      Full           Yes      Yes                                 +---------+---------------+---------+-----------+----------+--------------+ SFJ      Full                                                        +---------+---------------+---------+-----------+----------+--------------+

## 2021-09-02 NOTE — BH Assessment (Signed)
[place] Assessment Progress Note   Per Sheran Fava, NP this voluntary pt would benefit from admission to [facility].  Pt has been staffed with Ernie Hew, MD at [facility], who finds that pt's acuity is too great for this program, and that pt should be referred for inpatient services instead.  Tye Maryland, RN, [facility] expresses concern about CBC findings.  This Probation officer has discussed these concerns with EDP Dorie Rank, MD.  He reports that he is about to rotate out, but after discussing the problems with EDP Shirlyn Goltz, MD, he reports that he will ask Dr Darl Householder to address these concerns in a note.  Note is pending as of this writing.  Will notify Tye Maryland when it results.   Jalene Mullet, [place] Coordinator [phone]

## 2021-09-02 NOTE — BH Assessment (Addendum)
Comprehensive Clinical Assessment (CCA) Note  09/02/2021 Brent Rogers [id number]  Disposition: Per [facility] provider Sheran Fava, DNP, patient meets criteria for admission to the [facility]. [facility] provider and Disposition Counselor to follow up with the [facility], inquire abut bed availability, and the possibility acceptance to the facility. Upon further discussion the [facility] and [facility] providers determined that patient would not be an appropriate candidate for a [place] admission. Patient meets criteria for inpatient psychiatric treatment and the [place] Disposition Social Counselor or Disposition Social Worker will seek appropriate placement. .     Chief Complaint:  Chief Complaint  Patient presents with   Abdominal Pain   Nausea   Emesis   Diarrhea   Foot Pain   Visit Diagnosis: Major Depressive Disorder, Recurrent, Severe and Anxiety Disorder   CCA Screening, Triage and Referral (STR)  Patient Reported Information How did you hear about [place]? Self  What Is the Reason for Your Visit/Call Today? Brent Rogers is a 55 y.o. male stating, "I am really depressed", "I was thinking about hurting myself", and "Still thinking about hurting myself". Says that he has chronic pancreatitis, "I can't sleep, eat, and every time I go out I'm using the bathroom on myself'. [place] feels that his quality of life is poor and "It's not worth being here anymore".   Patient's suicidal thoughts started last week. They have been intermittent for the past week. States, "I've been trying to decide if I want to do pills or stab myself". He identifies church as a protective factor. No hx of suicide attempts and/or gestures. He is unable to contract for safety. He has access to pills and sharp objects, no access to firearms. Denies a hx of self-mutilating behaviors. Denies that he has a family hx of mental health illnesses.   Current depressive symptoms: hopelessness, worthlessness, fatigue, loss of interest  in usual pleasures, tearful, fatigue, anger/irritable, despondence, and insomnia. He sleeps 2 hrs per night. Appetite is poor. He reports significant weight loss, "over a 100 pounds". 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 He reports current symptoms of anxiety (severe). Also, panic attacks that are 1-2 times per day.   Patient denies homicidal ideations. Denies hx of aggressive and/or assaultive behaviors. No current legal issues. No pending court dates.  Patient reports occasional auditory hallucinations. States, "When I am alone, I here someone say, just do it". He last heard those voices yesterday, therefore; called the paramedics and asked that they bring him to the hospital. Brent Rogers visual hallucinations. Denies hx of inpatient psychiatrist. No therapist/psychiatrist.   Patient has a hx of alcohol use. Age of first use for alcohol is 55 y/o. He reports drinking, 1x per month, 1-2 beers per use. Last drink was 4-5 days ago; he drank #2 (12-ounce cans of beer).   Current support system is his family. Denies hx of trauma/abuse. Current unemployed, disabled. Highest level of education is 1 year of college.  How Long Has This Been Causing You Problems? > than 6 months  What Do You Feel Would Help You the Most Today? Stress Management; Treatment for Depression or other mood problem   Have You Recently Had Any Thoughts About Hurting Yourself? No  Are You Planning to Commit Suicide/Harm Yourself At This time? No   Have you Recently Had Thoughts About [place]? No  Are You Planning to Harm Someone at This Time? No  Explanation: No data  recorded  Have You Used Any Alcohol or Drugs in the Past 24 Hours? No  How Long Ago Did You Use Drugs or Alcohol? No data recorded What Did You Use and How Much? No data recorded  Do You Currently Have a Therapist/Psychiatrist? No  Name of Therapist/Psychiatrist: No data  recorded  Have You Been Recently Discharged From Any Office Practice or Programs? No  Explanation of Discharge From Practice/Program: No data recorded    CCA Screening Triage Referral Assessment Type of Contact: Tele-Assessment  Telemedicine Service Delivery: Telemedicine service delivery: This service was provided via telemedicine using a 2-way, interactive audio and video technology  Is this Initial or Reassessment? Initial Assessment  Date Telepsych consult ordered in [facility]:  09/02/21  Time Telepsych consult ordered in [facility]:  No data recorded Location of Assessment: [facility]  Provider Location: Other (comment) ([facility])   Collateral Involvement: No data recorded  Does Patient Have a [place]? No data recorded Name and Contact of Legal Guardian: No data recorded If Minor and Not Living with Parent(s), Who has Custody? No data recorded Is CPS involved or ever been involved? Never  Is APS involved or ever been involved? Never   Patient Determined To Be At Risk for Harm To Self or Others Based on Review of Patient Reported Information or Presenting Complaint? No  Method: No data recorded Availability of Means: No data recorded Intent: No data recorded Notification Required: No data recorded Additional Information for Danger to Others Potential: No data recorded Additional Comments for Danger to Others Potential: No data recorded Are There Guns or Other Weapons in Your Home? No data recorded Types of Guns/Weapons: No data recorded Are These Weapons Safely Secured?                            No data recorded Who Could Verify You Are Able To Have These Secured: No data recorded Do You Have any Outstanding Charges, Pending Court Dates, Parole/Probation? No data recorded Contacted To Inform of Risk of Harm To Self or Others: No data recorded   Does Patient Present under Involuntary Commitment? No  IVC Papers Initial File Date: No data recorded  [place] of  Residence: [place]   Patient Currently Receiving the Following Services: -- (Patient has no psych services at this time.)   Determination of Need: Emergent (2 hours)   Options For Referral: Medication Management; Facility-Based Crisis     CCA Biopsychosocial Patient Reported Schizophrenia/Schizoaffective Diagnosis in Past: No   Strengths: No data recorded  Mental Health Symptoms Depression:   Fatigue; Hopelessness; Difficulty Concentrating; Change in energy/activity; Increase/decrease in appetite; Irritability; Tearfulness (Current depressive symptoms: hopelessness, worthlessness, fatigue, loss of interest in usual pleasures, tearful, fatigue, anger/irritable, despondence, and insomnia.)   Duration of Depressive symptoms:  Duration of Depressive Symptoms: Greater than two weeks   Mania:   None   Anxiety:    Difficulty concentrating; Fatigue; Irritability; Sleep; Tension; Worrying   Psychosis:   Hallucinations   Duration of Psychotic symptoms:  Duration of Psychotic Symptoms: Greater than six months   Trauma:   None   Obsessions:   N/A   Compulsions:   N/A   Inattention:   N/A   Hyperactivity/Impulsivity:   N/A   Oppositional/Defiant Behaviors:   N/A   Emotional Irregularity:   N/A   Other Mood/Personality Symptoms:   calm and cooperative    Mental Status Exam Appearance and self-care  Stature:  Average   Weight:   Average weight   Clothing:   Neat/clean   Grooming:   Normal   Cosmetic use:   Age appropriate   Posture/gait:   Normal   Motor activity:   Not Remarkable   Sensorium  Attention:   Normal   Concentration:   Normal   Orientation:   Time; Situation; Place; Person; Object   Recall/memory:   Normal   Affect and Mood  Affect:   Depressed   Mood:   Anxious   Relating  Eye contact:   None   Facial expression:   Depressed   Attitude toward examiner:   Cooperative   Thought and Language  Speech  flow:  Clear and Coherent   Thought content:   Appropriate to Mood and Circumstances   Preoccupation:   Suicide   Hallucinations:   None   Organization:  No data recorded  [employer] of Knowledge:   Average   Intelligence:   Average   Abstraction:   Normal   Judgement:   Fair   Reality Testing:   Adequate   Insight:   Gaps; Lacking   Decision Making:   Normal   Social Functioning  Social Maturity:   Responsible   Social Judgement:   Normal   Stress  Stressors:   Other (Comment) (medical issues)   Coping Ability:   Normal   Skill Deficits:   Self-care   Supports:   Other (Comment) Water quality scientist)     Religion: Religion/Spirituality Are You A Religious Person?: No  Leisure/Recreation: Leisure / Recreation Do You Have Hobbies?: Yes Leisure and Hobbies: "Before my surgery, I was playing baseball, every since my surgery...all of that was taken away from me because I didn't have the strenght". "I would go for 2-3 days, then down for the next 4-5 days, so I had to stop".  Exercise/Diet: Exercise/Diet Have You Gained or Lost A Significant Amount of Weight in the Past Six Months?: Yes-Lost (Appetite is poor. He reports significant weight loss, "over a 100 pounds". 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 and believe this may have contributed to some of his weight loss.) Number of Pounds Lost?:  (100 pounds) Do You Follow a Special Diet?: No Type of Diet: "Sometimes when I come here they put me on a clear liquid diet" Do You Have Any Trouble Sleeping?: Yes Explanation of Sleeping Difficulties: He sleeps 2 hrs per night   CCA Employment/Education Employment/Work Situation: Employment / Work Situation Employment Situation: On disability Why is Patient on Disability: for medical reasons How Long has Patient Been on Disability: 1.5 years Patient's Job has Been Impacted by Current Illness: No Has Patient ever Been in the [employer]?:  No  Education: Education Is Patient Currently Attending School?: No Did Physicist, medical?: No Did You Have An Individualized Education Program (IIEP): No Did You Have Any Difficulty At School?: No Patient's Education Has Been Impacted by Current Illness: No   CCA Family/Childhood History Family and Relationship History: Family history Marital status: Single Does patient have children?: No  Childhood History:  Childhood History By whom was/is the patient raised?: Both parents Did patient suffer any verbal/emotional/physical/sexual abuse as a child?: No Did patient suffer from severe childhood neglect?: No Has patient ever been sexually abused/assaulted/raped as an adolescent or adult?: No Was the patient ever a victim of a crime or a disaster?: No Witnessed domestic violence?: No Has patient been affected by domestic violence as an adult?: No  Child/Adolescent  Assessment:     CCA Substance Use Alcohol/Drug Use: Alcohol / Drug Use Pain Medications: SEE MAR Prescriptions: SEE MAR Over the Counter: SEE MAR History of alcohol / drug use?: Yes (Patient has a hx of alcohol use. Age of first use for alcohol is 55 y/o. He reports drinking, 1x per month, 1-2 beers per use. Last drink was 4-5 days ago; he drank #2 (12-ounce cans of beer).) Substance #1 Name of Substance 1: Patient has a hx of alcohol use. Age of first use for alcohol is 55 y/o. He reports drinking, 1x per month, 1-2 beers per use. Last drink was 4-5 days ago; he drank #2 (12-ounce cans of beer). 1 - Age of First Use: 18 1 - Amount (size/oz): 1-2 beers 1 - Frequency: 1x per month 1 - Duration: on-going 1 - Last Use / Amount: 4-5 days ago/ #2 12 ounce cans of beer 1 - Method of Aquiring: unknown 1- Route of Use: oral                       ASAM's:  Six Dimensions of Multidimensional Assessment  Dimension 1:  Acute Intoxication and/or Withdrawal Potential:      Dimension 2:  Biomedical Conditions and  Complications:      Dimension 3:  Emotional, Behavioral, or Cognitive Conditions and Complications:     Dimension 4:  Readiness to Change:     Dimension 5:  Relapse, Continued use, or Continued Problem Potential:     Dimension 6:  Recovery/Living Environment:     ASAM Severity Score:    ASAM Recommended Level of Treatment:     Substance use Disorder (SUD) Substance Use Disorder (SUD)  Checklist Symptoms of Substance Use: Presence of craving or strong urge to use, Continued use despite persistent or recurrent social, interpersonal problems, caused or exacerbated by use, Continued use despite having a persistent/recurrent physical/psychological problem caused/exacerbated by use  Recommendations for Services/Supports/Treatments: Recommendations for Services/Supports/Treatments Recommendations For Services/Supports/Treatments: Facility Based Crisis, Medication Management  Discharge Disposition:    DSM5 Diagnoses: Patient Active Problem List   Diagnosis Date Noted   Major depressive disorder, recurrent episode, moderate ([place]) 03/24/2021   Open wound of pharynx without complication 13/04/6577   Atherosclerosis of native coronary artery of native heart without angina pectoris    Blood in stool    Iron deficiency anemia    Syncope 03/06/2021   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   Chronic pain syndrome 06/07/2019   Depression 06/07/2019   Chronic pancreatitis ([place]) 05/01/2017   Foot callus 12/25/2015   Onychomycosis of toenail 12/25/2015   Fatty liver 11/12/2015   GERD (gastroesophageal reflux disease) 11/12/2015   HTN (hypertension) 07/10/2015   S/P cholecystectomy 07/10/2015   Tobacco abuse 12/26/2014   Protein-calorie malnutrition, severe ([place]) 11/20/2014   Exocrine pancreatic insufficiency ([place]) 12/23/2013     Referrals to Alternative Service(s): Referred to Alternative Service(s):   Place:   Date:   Time:    Referred to Alternative Service(s):   Place:   Date:    Time:    Referred to Alternative Service(s):   Place:   Date:   Time:    Referred to Alternative Service(s):   Place:   Date:   Time:     Waldon Merl, Counselor

## 2021-09-02 NOTE — ED Notes (Signed)
Patient was given graham crackers and orange juice.

## 2021-09-02 NOTE — Progress Notes (Signed)
Left lower extremity venous duplex has been completed. Preliminary results can be found in CV Proc through chart review.  Results were given to Eustaquio Maize PA.  09/02/21 8:56 AM Brent Rogers RVT

## 2021-09-02 NOTE — Progress Notes (Signed)
Per Sheran Fava, FNP recommends inpatient behavioral health placement.@11 :12pm  CSW sent a secure chat to [facility] Lucia Gaskins, RN to review pt. CSW is awaiting response. CSW will assist with inpatient behavioral health placement.   Benjaman Kindler, MSW, LCSWA 09/02/2021 11:14 PM

## 2021-09-02 NOTE — BH Assessment (Signed)
TO BE SEEN: @0907 , TTS attempted to see patient. Per his nurse (Destiny, RN), "IV team is in the room right now". His nurse will notify TTS when patient is ready to be seen.

## 2021-09-02 NOTE — ED Notes (Signed)
Vascular at bedside

## 2021-09-02 NOTE — ED Triage Notes (Signed)
Pt BIB EMS with complaints of abdominal pain, nausea, vomiting, diarrhea, and left foot pain x 1 week.

## 2021-09-03 ENCOUNTER — Encounter (HOSPITAL_COMMUNITY): Payer: Self-pay | Admitting: Family

## 2021-09-03 ENCOUNTER — Other Ambulatory Visit: Payer: Self-pay

## 2021-09-03 ENCOUNTER — Inpatient Hospital Stay (HOSPITAL_COMMUNITY)
Admission: AD | Admit: 2021-09-03 | Discharge: 2021-09-06 | DRG: 885 | Disposition: A | Discharge status: Home / Self Care | Payer: Medicaid Other | Source: Intra-hospital | Attending: Psychiatry | Admitting: Psychiatry

## 2021-09-03 DIAGNOSIS — F32A Depression, unspecified: Secondary | ICD-10-CM

## 2021-09-03 DIAGNOSIS — G43909 Migraine, unspecified, not intractable, without status migrainosus: Secondary | ICD-10-CM | POA: Diagnosis present

## 2021-09-03 DIAGNOSIS — F17210 Nicotine dependence, cigarettes, uncomplicated: Secondary | ICD-10-CM | POA: Diagnosis present

## 2021-09-03 DIAGNOSIS — Z825 Family history of asthma and other chronic lower respiratory diseases: Secondary | ICD-10-CM | POA: Diagnosis not present

## 2021-09-03 DIAGNOSIS — K861 Other chronic pancreatitis: Secondary | ICD-10-CM | POA: Diagnosis present

## 2021-09-03 DIAGNOSIS — F411 Generalized anxiety disorder: Secondary | ICD-10-CM | POA: Diagnosis present

## 2021-09-03 DIAGNOSIS — R569 Unspecified convulsions: Secondary | ICD-10-CM | POA: Diagnosis present

## 2021-09-03 DIAGNOSIS — Z20822 Contact with and (suspected) exposure to covid-19: Secondary | ICD-10-CM | POA: Diagnosis present

## 2021-09-03 DIAGNOSIS — Z809 Family history of malignant neoplasm, unspecified: Secondary | ICD-10-CM

## 2021-09-03 DIAGNOSIS — Z8249 Family history of ischemic heart disease and other diseases of the circulatory system: Secondary | ICD-10-CM

## 2021-09-03 DIAGNOSIS — Z79899 Other long term (current) drug therapy: Secondary | ICD-10-CM

## 2021-09-03 DIAGNOSIS — G479 Sleep disorder, unspecified: Secondary | ICD-10-CM | POA: Diagnosis present

## 2021-09-03 DIAGNOSIS — M419 Scoliosis, unspecified: Secondary | ICD-10-CM | POA: Diagnosis present

## 2021-09-03 DIAGNOSIS — Z90411 Acquired partial absence of pancreas: Secondary | ICD-10-CM

## 2021-09-03 DIAGNOSIS — K219 Gastro-esophageal reflux disease without esophagitis: Secondary | ICD-10-CM | POA: Diagnosis present

## 2021-09-03 DIAGNOSIS — F332 Major depressive disorder, recurrent severe without psychotic features: Principal | ICD-10-CM | POA: Diagnosis present

## 2021-09-03 DIAGNOSIS — R45851 Suicidal ideations: Secondary | ICD-10-CM | POA: Diagnosis present

## 2021-09-03 DIAGNOSIS — I10 Essential (primary) hypertension: Secondary | ICD-10-CM | POA: Diagnosis present

## 2021-09-03 MED ORDER — HYDROXYZINE HCL 25 MG PO TABS
25.0000 mg | ORAL_TABLET | Freq: Three times a day (TID) | ORAL | Status: DC | PRN
  Administered 2021-09-03 – 2021-09-06 (×5): 25 mg via ORAL
  Filled 2021-09-03 (×5): qty 1

## 2021-09-03 MED ORDER — FAMOTIDINE 20 MG PO TABS
20.0000 mg | ORAL_TABLET | Freq: Two times a day (BID) | ORAL | Status: DC
  Administered 2021-09-04 – 2021-09-06 (×5): 20 mg via ORAL
  Filled 2021-09-03 (×9): qty 1

## 2021-09-03 MED ORDER — ONDANSETRON 4 MG PO TBDP
4.0000 mg | ORAL_TABLET | Freq: Three times a day (TID) | ORAL | Status: DC | PRN
  Administered 2021-09-03 – 2021-09-04 (×3): 4 mg via ORAL
  Filled 2021-09-03 (×3): qty 1

## 2021-09-03 MED ORDER — BISMUTH SUBSALICYLATE 262 MG/15ML PO SUSP
30.0000 mL | Freq: Four times a day (QID) | ORAL | Status: DC | PRN
  Filled 2021-09-03: qty 118

## 2021-09-03 MED ORDER — ALUM & MAG HYDROXIDE-SIMETH 200-200-20 MG/5ML PO SUSP: 30.0000 mL | ORAL | Status: DC | PRN

## 2021-09-03 MED ORDER — ACETAMINOPHEN 325 MG PO TABS
650.0000 mg | ORAL_TABLET | Freq: Four times a day (QID) | ORAL | Status: DC | PRN
  Filled 2021-09-03: qty 2

## 2021-09-03 MED ORDER — TRAZODONE HCL 50 MG PO TABS
50.0000 mg | ORAL_TABLET | Freq: Every evening | ORAL | Status: DC | PRN
  Administered 2021-09-03 – 2021-09-05 (×2): 50 mg via ORAL
  Filled 2021-09-03 (×2): qty 1

## 2021-09-03 MED ORDER — MAGNESIUM HYDROXIDE 400 MG/5ML PO SUSP: 30.0000 mL | Freq: Every day | ORAL | Status: DC | PRN

## 2021-09-03 MED ORDER — AMLODIPINE BESYLATE 5 MG PO TABS
5.0000 mg | ORAL_TABLET | Freq: Every day | ORAL | Status: DC
  Administered 2021-09-04 – 2021-09-06 (×3): 5 mg via ORAL
  Filled 2021-09-03 (×6): qty 1

## 2021-09-03 MED ORDER — LOPERAMIDE HCL 2 MG PO CAPS
2.0000 mg | ORAL_CAPSULE | ORAL | Status: DC | PRN
  Administered 2021-09-04: 2 mg via ORAL
  Filled 2021-09-03: qty 1

## 2021-09-03 NOTE — ED Provider Notes (Signed)
I assumed care of the patient at 0 700.  Patient is waiting for psychiatric placement.  I received notification that there was some concern about the patient's hemoglobin and hematocrit.  This appears to be at patient's baseline.  Does not appear to be acutely losing blood.  I feel he is medically clear.   Deno Etienne, DO 09/03/21 [phone]

## 2021-09-03 NOTE — ED Notes (Signed)
Pt resting comfortable in bed at this time. All comfort and safety measures in place in room with bed in lowest position.   ?

## 2021-09-03 NOTE — Progress Notes (Signed)
Pt admitted voluntarily to [facility] inpatient adult unit.  Pt reported he lost the will to live due to physical health.  t was having thoughts to cut himself, stating he had a knife in his hand and called 911 for help.Sometimes unable to hold urine/bowels.  Pt stated "my house is a mess"  "I wish I could find a hoarder group to come clean it up while I'm gone so I can start fresh.   Pt is disabled and received SSI.  Pt continues to endorse suicidal ideation without plan.  Contracts for safety.  Pt also endorsed auditory and visual hallucination at times, last experienced 12/4.  AH are not command in nature.  Pt reported 3 falls last week.  Pt presented to the ED after falling but was not admitted.  Fifteen minute checks initiated for patient safety.  Pt safe on unit.

## 2021-09-03 NOTE — ED Notes (Signed)
Meal tray provided at this time.

## 2021-09-03 NOTE — BH Assessment (Signed)
[place] Assessment Progress Note   Per Sheran Fava, NP , this pt requires psychiatric hospitalization at this time.  Danika, RN, [facility] has assigned pt to [facility] Rm 302-2 to the service of Dr Caswell Corwin.  [place] will be ready to receive pt at 13:00.  Pt has signed Voluntary Admission and Consent for Treatment, as well as Consent to Release Information to his PCP, Dr Wynetta Emery at [facility], and signed forms have been faxed to [facility].  [place], DO and pt's nurse, Zacarias Pontes, have been notified, and [place] agrees to send original paperwork along with pt via Safe Transport, and to call report to [phone].  Jalene Mullet, [place] Coordinator [phone]

## 2021-09-03 NOTE — BHH Group Notes (Signed)
Pt didn't attend group. 

## 2021-09-03 NOTE — Consult Note (Signed)
  Patient is seen and reassessed by this nurse practitioner.  He continues to meet inpatient criteria at this time.  He further endorses increased depressive symptoms, to include suicidal ideations with a plan, withdrawn, worthlessness, hopelessness, guilty, and anhedonia.  He also reports an increased amount of isolation and what he describes as loneliness.  He denies having a support system.  Patient is unable to contract for safety at this time, and will continue to meet inpatient criteria.  -Patient has been accepted to [facility]. -We will place transfer orders at this time. -Patient appears to be voluntary, and will be able to transport via safe transport.

## 2021-09-03 NOTE — ED Notes (Addendum)
Pt was changed into burgundy scrubs. All of pts possessions were placed in the cabinet labelled rooms 1-4 at the nurses station.

## 2021-09-03 NOTE — Progress Notes (Signed)
   09/03/21 2227  Psych Admission Type (Psych Patients Only)  Admission Status Voluntary  Psychosocial Assessment  Patient Complaints Depression;Sadness;Hopelessness  Eye Contact Brief  Facial Expression Flat  Affect Appropriate to circumstance;Depressed  Speech Logical/coherent  Interaction Assertive  Motor Activity Slow  Appearance/Hygiene Unremarkable  Behavior Characteristics Cooperative;Appropriate to situation  Mood Depressed;Pleasant  Thought Process  Coherency WDL  Content WDL  Delusions None reported or observed  Perception WDL  Hallucination Auditory;Visual  Judgment WDL  Confusion None  Danger to Self  Current suicidal ideation? Passive  Self-Injurious Behavior No self-injurious ideation or behavior indicators observed or expressed   Agreement Not to Harm Self Yes  Description of Agreement Verbal contract  Danger to Others  Danger to Others None reported or observed

## 2021-09-03 NOTE — Tx Team (Signed)
Initial Treatment Plan 09/03/2021 6:16 PM Brent Rogers [id number]    PATIENT STRESSORS: Health problems     PATIENT STRENGTHS: Average or above average intelligence  Capable of independent living  General fund of knowledge  Motivation for treatment/growth  Supportive family/friends    PATIENT IDENTIFIED PROBLEMS: Suicidal ideation  "I lost the will to live"  depression  anxiety                 DISCHARGE CRITERIA:  Improved stabilization in mood, thinking, and/or behavior Motivation to continue treatment in a less acute level of care Need for constant or close observation no longer present Reduction of life-threatening or endangering symptoms to within safe limits Verbal commitment to aftercare and medication compliance  PRELIMINARY DISCHARGE PLAN: Outpatient therapy Return to previous living arrangement  PATIENT/FAMILY INVOLVEMENT: This treatment plan has been presented to and reviewed with the patient, Brent Rogers, and/or family member.  The patient and family have been given the opportunity to ask questions and make suggestions.  Judie Petit, RN 09/03/2021, 6:16 PM

## 2021-09-03 NOTE — Progress Notes (Signed)
CSW informed by [facility] Lucia Gaskins, RN who reviewed chart and at this time there are no currently in regards to the concerns about pt's CBC result. CSW and [facility] are awaiting an updates provider note addressing concerns by first shift [place], Therapist, sports.    Benjaman Kindler, MSW, [facility] 09/03/2021 12:35 AM

## 2021-09-04 ENCOUNTER — Encounter (HOSPITAL_COMMUNITY): Payer: Self-pay | Admitting: Family

## 2021-09-04 ENCOUNTER — Encounter (HOSPITAL_COMMUNITY): Payer: Self-pay

## 2021-09-04 DIAGNOSIS — F332 Major depressive disorder, recurrent severe without psychotic features: Secondary | ICD-10-CM

## 2021-09-04 DIAGNOSIS — F411 Generalized anxiety disorder: Secondary | ICD-10-CM

## 2021-09-04 LAB — TSH: TSH: 2.392 u[IU]/mL (ref 0.350–4.500)

## 2021-09-04 LAB — HEMOGLOBIN A1C
Hgb A1c MFr Bld: 5.3 % (ref 4.8–5.6)
Mean Plasma Glucose: 105.41 mg/dL

## 2021-09-04 LAB — LIPID PANEL
Cholesterol: 125 mg/dL (ref 0–200)
HDL: 64 mg/dL (ref >40)
LDL Cholesterol: 52 mg/dL (ref 0–99)
Total CHOL/HDL Ratio: 2 RATIO
Triglycerides: 46 mg/dL (ref <150)
VLDL: 9 mg/dL (ref 0–40)

## 2021-09-04 MED ORDER — VENLAFAXINE HCL ER 37.5 MG PO CP24: 37.5000 mg | ORAL_CAPSULE | Freq: Every day | ORAL | Status: DC

## 2021-09-04 MED ORDER — BISMUTH SUBSALICYLATE 262 MG PO CHEW: 524.0000 mg | CHEWABLE_TABLET | Freq: Four times a day (QID) | ORAL | Status: DC | PRN

## 2021-09-04 MED ORDER — VENLAFAXINE HCL ER 75 MG PO CP24
75.0000 mg | ORAL_CAPSULE | Freq: Every day | ORAL | Status: DC
  Administered 2021-09-05 – 2021-09-06 (×2): 75 mg via ORAL
  Filled 2021-09-04 (×4): qty 1

## 2021-09-04 MED ORDER — ENSURE ENLIVE PO LIQD
237.0000 mL | Freq: Two times a day (BID) | ORAL | Status: DC
  Administered 2021-09-04: 237 mL via ORAL
  Filled 2021-09-04 (×7): qty 237

## 2021-09-04 MED ORDER — PANCRELIPASE (LIP-PROT-AMYL) 12000-38000 UNITS PO CPEP
24000.0000 [IU] | ORAL_CAPSULE | Freq: Three times a day (TID) | ORAL | Status: DC
  Administered 2021-09-05 – 2021-09-06 (×5): 24000 [IU] via ORAL
  Filled 2021-09-04 (×7): qty 2

## 2021-09-04 MED ORDER — VENLAFAXINE HCL ER 37.5 MG PO CP24
37.5000 mg | ORAL_CAPSULE | Freq: Every day | ORAL | Status: AC
  Administered 2021-09-04: 37.5 mg via ORAL
  Filled 2021-09-04: qty 1

## 2021-09-04 MED ORDER — MIRTAZAPINE 15 MG PO TABS
15.0000 mg | ORAL_TABLET | Freq: Every day | ORAL | Status: DC
  Administered 2021-09-04 – 2021-09-05 (×2): 15 mg via ORAL
  Filled 2021-09-04 (×3): qty 1

## 2021-09-04 MED ORDER — VENLAFAXINE HCL ER 75 MG PO CP24: 75.0000 mg | ORAL_CAPSULE | Freq: Every day | ORAL | Status: DC

## 2021-09-04 NOTE — H&P (Addendum)
Psychiatric Admission Assessment Adult  Patient Identification: Brent Rogers MRN:  [id number] Date of Evaluation:  09/04/2021 Chief Complaint:  MDD (major depressive disorder), recurrent episode, severe ([place]) [F33.2] Principal Diagnosis: Major depressive disorder, recurrent episode, severe ([place]) Diagnosis:  Principal Problem:   Major depressive disorder, recurrent episode, severe ([place]) Active Problems:   GAD (generalized anxiety disorder)   History of Present Illness: Brent Rogers is a 55 year old male with a history of MDD and pancreatic insufficiency secondary to a Whipple procedure in 2015 who presents voluntarily under no acute distress for suicidal ideation with plan.   He reports that he has had ongoing somatic symptoms of pancreatic insufficiency over the past two years, including nausea, vomiting, diarrhea, weight loss, decreased appetite, and bowel incontinence that have impaired his lifestyle. He reports that his medical problems and subsequent physical decline have caused his depression to worsen and have suicidal thoughts, as he reports that life is not worth living in his current medically-ill state.   He reports worsening disappointment and sadness about what his life has become and started having suicidal thoughts one week ago. He reports that the thoughts started out as passive because he no longer sees the point of living but since then have become more active. He was thinking about ways to end his life, including the use of a knife or pills. Per chart review, the patient had a knife in his hand and was going to cut himself when he decided to call 911 for help. He also reports anhedonia, decreased energy, some trouble concentrating, decreased appetite, sleep disturbances, and increasing self-isolation. He sometimes takes OTC sleep medications. He reports having suicidal thoughts, that are passive, without intent or plan. Denies HI.   Reports chronically elevated anxiety, that  has worsened since admission and currently 7/10. Denies manic / hypomanic episodes at this time or in the past. Denies symptoms of psychosis at this time or in the past.  Since he has been admitted he reports that his anxiety and depression have worsened because he has no distractions. He has been uninterested in interacting with peers and going to group sessions.    Associated Signs/Symptoms: Depression Symptoms:  depressed mood, anhedonia, fatigue, feelings of worthlessness/guilt, hopelessness, suicidal thoughts with specific plan, anxiety, loss of energy/fatigue, disturbed sleep, weight loss, decreased appetite, Duration of Depression Symptoms: Greater than two weeks  (Hypo) Manic Symptoms:  Negative for distractibility, impulsivity, elevated mood, flight of ideas, financial extravagance, grandiosity, hallucinations.  Anxiety Symptoms:  Excessive Worry, Psychotic Symptoms:  Negative for spontaneous delusions, hallucinations, ideas of reference, or paranoia. PTSD Symptoms: Denies history of trauma Total Time spent with patient: 30 minutes  Past Psychiatric Hx: Previous Psych Diagnoses: MDD Prior inpatient treatment: N/A Current/prior outpatient treatment: Cymbalta Prior rehab hx: N/A Psychotherapy hx: Did not ask History of suicide: N/A History of homicide: N/A Psychiatric medication history: Cymbalta 20 mg Psychiatric medication compliance history: good compliance Neuromodulation history: N/A Current Psychiatrist: N/A Current therapist: N/A  Substance Abuse Hx: Alcohol: one can of beer per month Tobacco: half pack per day Illicit drugs: denies use of cocaine / opioids / amphetamines / hallucinogens / inhalants  Rx drug abuse: N/A Rehab hx: N/A  Past Medical History: Medical Diagnoses: Pancreatic insufficiency, HTN, GERD Home Rx: amlodipine 5 mg, creon 24000 units, famotidine 20 mg BID, pepto bismol 30 mL PRN, ferrous sulfate 325 mg every other day Prior Hosp:  Denies Prior Surgeries/Trauma: Whipple procedure in 2015 Head trauma, LOC, concussions, seizures: history of possible stroke  Allergies: NKDA PCP: Dr. 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 Yes.    Has the patient been a risk to self in the past 6 months? Yes.    Has the patient been a risk to self within the distant past? Yes.    Is the patient a risk to others? No.  Has the patient been a risk to others in the past 6 months? No.  Has the patient been a risk to others within the distant past? No.    Alcohol Screening:  1. How often do you have a drink containing alcohol?: Monthly or less 2. How many drinks containing alcohol do you have on a typical day when you are drinking?: 1 or 2 3. How often do you have six or more drinks on one occasion?: Never AUDIT-C Score: 1 4. How often during the last year have you found that you were not able to stop drinking once you had started?: Never 5. How often during the last year have you failed to do what was normally expected from you because of drinking?: Never 6. How often during the last year have you needed a first drink in the morning to get yourself going after a heavy drinking session?: Never 7. How often during the last year have you had a feeling of guilt of remorse after drinking?: Never 8. How often during the last year have you been unable to remember what happened the  night before because you had been drinking?: Never 9. Have you or someone else been injured as a result of your drinking?: No 10. Has a relative or friend or a doctor or another health worker been concerned about your drinking or suggested you cut down?: Yes, but not in the last year (pt stated doctor suggested d/t issues with pancreas) Alcohol Use Disorder Identification Test Final Score (AUDIT): 3 Substance Abuse History in the last 12 months:  No. Consequences of Substance Abuse: NA Previous Psychotropic Medications: Yes  Psychological Evaluations: Yes  Past Medical History:  Past Medical History:  Diagnosis Date   Benign tumor of endocrine pancreas    Chronic pancreatitis ([place])    S/P Whipple   Foot ulcer with fat layer exposed ([place])    Hypertension    Migraine    "@ least once/month" (11/12/2015)   Pancreatic abnormality    CT  shows mass   Pneumonia 11/12/2015   Scoliosis     Past Surgical History:  Procedure Laterality Date   BACK SURGERY     COLONOSCOPY WITH PROPOFOL N/A 03/11/2021   Procedure: COLONOSCOPY WITH PROPOFOL;  Surgeon: Carol Ada, MD;  Location: [facility];  Service: Endoscopy;  Laterality: N/A;   ESOPHAGOGASTRODUODENOSCOPY (EGD) WITH PROPOFOL N/A 03/11/2021   Procedure: ESOPHAGOGASTRODUODENOSCOPY (EGD) WITH PROPOFOL;  Surgeon: Carol Ada, MD;  Location: [facility];  Service: Endoscopy;  Laterality: N/A;   EUS N/A 09/21/2013   Procedure: ESOPHAGEAL ENDOSCOPIC ULTRASOUND (EUS) RADIAL;  Surgeon: Tory Emerald  Benson Norway, MD;  Location: [facility];  Service: Endoscopy;  Laterality: N/A;   EUS N/A 09/30/2013   Procedure: UPPER ENDOSCOPIC ULTRASOUND (EUS) LINEAR;  Surgeon: Beryle Beams, MD;  Location: [facility];  Service: Endoscopy;  Laterality: N/A;   FINE NEEDLE ASPIRATION N/A 09/21/2013   Procedure: FINE NEEDLE ASPIRATION (FNA) LINEAR;  Surgeon: Beryle Beams, MD;  Location: [facility];  Service: Endoscopy;  Laterality: N/A;   FINGER FRACTURE SURGERY Left  1995   5th digit   FRACTURE SURGERY     HEMOSTASIS CLIP PLACEMENT  03/11/2021   Procedure: HEMOSTASIS CLIP PLACEMENT;  Surgeon: Carol Ada, MD;  Location: [facility] ENDOSCOPY;  Service: Endoscopy;;   HEMOSTASIS CONTROL  03/11/2021   Procedure: HEMOSTASIS CONTROL;  Surgeon: Carol Ada, MD;  Location: [facility];  Service: Endoscopy;;   KNEE ARTHROSCOPY Bilateral 1987-1989   right-left   LAPAROSCOPY N/A 12/01/2013   Procedure: LAPAROSCOPY DIAGNOSTIC PANCREATICODUODENECTOMY WITH BILIARY AND PANCREATIC STENTS;  Surgeon: Stark Klein, MD;  Location: [facility];  Service: General;  Laterality: N/A;   LUMBAR [place] SURGERY  1990's   SAVORY DILATION N/A 03/11/2021   Procedure: SAVORY DILATION;  Surgeon: Carol Ada, MD;  Location: [facility];  Service: Endoscopy;  Laterality: N/A;   WHIPPLE PROCEDURE  12/01/2013   Family History:  Family History  Problem Relation Age of Onset   Cancer Mother        unsure   COPD Mother    Hypertension Mother    Hypertension Father    COPD Father    CAD Sister        possible has stent per pt   Family Psychiatric  History: N/A Tobacco Screening:    Social History:  Social History   Substance and Sexual Activity  Alcohol Use Yes   Alcohol/week: 2.0 standard drinks   Types: 2 Cans of beer per week   Comment: monthly     Social History   Substance and Sexual Activity  Drug Use No    Additional Social History:       Allergies:  No Known Allergies Lab Results:  Results for orders placed or performed during the hospital encounter of 09/03/21 (from the past 48 hour(s))  Lipid panel     Status: None   Collection Time: 09/04/21  6:45 AM  Result Value Ref Range   Cholesterol 125 0 - 200 mg/dL   Triglycerides 46 <150 mg/dL   HDL 64 >40 mg/dL   Total CHOL/HDL Ratio 2.0 RATIO   VLDL 9 0 - 40 mg/dL   LDL Cholesterol 52 0 - 99 mg/dL    Comment:        Total Cholesterol/HDL:CHD Risk Coronary Heart Disease Risk Table                     Men   Women  1/2  Average Risk   3.4   3.3  Average Risk       5.0   4.4  2 X Average Risk   9.6   7.1  3 X Average Risk  23.4   11.0        Use the calculated Patient Ratio above and the CHD Risk Table to determine the patient's CHD Risk.        ATP III CLASSIFICATION (LDL):  <100     mg/dL   Optimal  100-129  mg/dL   Near or Above  Optimal  130-159  mg/dL   Borderline  160-189  mg/dL   High  >190     mg/dL   Very High Performed at [facility] [address] [place], [place] [address]   Hemoglobin A1c     Status: None   Collection Time: 09/04/21  6:45 AM  Result Value Ref Range   Hgb A1c MFr Bld 5.3 4.8 - 5.6 %    Comment: (NOTE) Pre diabetes:          5.7%-6.4%  Diabetes:              >6.4%  Glycemic control for   <7.0% adults with diabetes    Mean Plasma Glucose 105.41 mg/dL    Comment: Performed at [facility] [address], [place], [place] [address]  TSH     Status: None   Collection Time: 09/04/21  6:45 AM  Result Value Ref Range   TSH 2.392 0.350 - 4.500 uIU/mL    Comment: Performed by a 3rd Generation assay with a functional sensitivity of <=0.01 uIU/mL. Performed at [facility], [place] [address], [place], [place] [address]     Blood Alcohol level:  Lab Results  Component Value Date   ETH <10 09/02/2021   ETH 15 (H) 94/17/4081    Metabolic Disorder Labs:  Lab Results  Component Value Date   HGBA1C 5.3 09/04/2021   MPG 105.41 09/04/2021   MPG 103 03/06/2021   No results found for: PROLACTIN Lab Results  Component Value Date   CHOL 125 09/04/2021   TRIG 46 09/04/2021   HDL 64 09/04/2021   CHOLHDL 2.0 09/04/2021   VLDL 9 09/04/2021   LDLCALC 52 09/04/2021   LDLCALC 48 03/06/2021    Current Medications: Current Facility-Administered Medications  Medication Dose Route Frequency Provider Last Rate Last Admin   acetaminophen (TYLENOL) tablet 650 mg  650 mg Oral Q6H PRN Suella Broad, FNP        alum & mag hydroxide-simeth (MAALOX/MYLANTA) 200-200-20 MG/5ML suspension 30 mL  30 mL Oral Q4H PRN Starkes-[place], Gayland Curry, FNP       amLODipine (NORVASC) tablet 5 mg  5 mg Oral Daily Suella Broad, FNP   5 mg at 09/04/21 4481   bismuth subsalicylate (PEPTO BISMOL) chewable tablet 524 mg  524 mg Oral Q6H PRN Riot Waterworth, Ovid Curd, MD       famotidine (PEPCID) tablet 20 mg  20 mg Oral BID Suella Broad, FNP   20 mg at 09/04/21 1704   feeding supplement (ENSURE ENLIVE / ENSURE PLUS) liquid 237 mL  237 mL Oral BID BM Ramona Ruark, Ovid Curd, MD   237 mL at 09/04/21 0940   hydrOXYzine (ATARAX) tablet 25 mg  25 mg Oral TID PRN Prescilla Sours, PA-C   25 mg at 09/04/21 0825   loperamide (IMODIUM) capsule 2 mg  2 mg Oral PRN Margorie John W, PA-C       magnesium hydroxide (MILK OF MAGNESIA) suspension 30 mL  30 mL Oral Daily PRN Suella Broad, FNP       mirtazapine (REMERON) tablet 15 mg  15 mg Oral Aliene Altes, MD       ondansetron (ZOFRAN-ODT) disintegrating tablet 4 mg  4 mg Oral Q8H PRN Prescilla Sours, PA-C   4 mg at 09/04/21 0827   traZODone (DESYREL) tablet 50 mg  50 mg Oral QHS PRN Prescilla Sours, PA-C   50 mg at 09/03/21 2227   [  START ON 09/05/2021] venlafaxine XR (EFFEXOR-XR) 24 hr capsule 75 mg  75 mg Oral Q breakfast France Ravens, MD       PTA Medications: Medications Prior to Admission  Medication Sig Dispense Refill Last Dose   acetaminophen (TYLENOL) 500 MG tablet Take 1,500 mg by mouth every 6 (six) hours as needed for mild pain.      amLODipine (NORVASC) 5 MG tablet Take 1 tablet (5 mg total) by mouth daily. 90 tablet 2    bismuth subsalicylate (PEPTO BISMOL) 262 MG/15ML suspension Take 30 mLs by mouth every 6 (six) hours as needed for indigestion.      DULoxetine (CYMBALTA) 20 MG capsule Take 1 capsule (20 mg total) by mouth daily. (Patient not taking: Reported on 09/03/2021) 30 capsule 3    famotidine (PEPCID) 20 MG tablet Take 1 tablet (20 mg total) by mouth 2  (two) times daily. (Patient not taking: Reported on 09/03/2021) 30 tablet 0    ferrous sulfate 325 (65 FE) MG tablet Take 1 tablet (325 mg total) by mouth daily. (Patient taking differently: Take 325 mg by mouth every other day.) 100 tablet 1    ondansetron (ZOFRAN) 4 MG tablet Take 1 tablet (4 mg total) by mouth every 6 (six) hours. (Patient not taking: Reported on 09/03/2021) 12 tablet 0    Pancrelipase, Lip-Prot-Amyl, (CREON) 24000-76000 units CPEP Take 1 capsule (24,000 Units total) by mouth with breakfast, with lunch, and with evening meal. (Patient not taking: Reported on 09/03/2021) 270 capsule 1    pantoprazole (PROTONIX) 40 MG tablet Take 1 tablet (40 mg total) by mouth daily. (Patient not taking: Reported on 09/03/2021) 30 tablet 3    promethazine (PHENERGAN) 25 MG tablet Take 1 tablet (25 mg total) by mouth every 6 (six) hours as needed for nausea or vomiting. (Patient not taking: Reported on 09/03/2021) 15 tablet 0    sucralfate (CARAFATE) 1 GM/10ML suspension Take 10 mLs (1 g total) by mouth 4 (four) times daily -  with meals and at bedtime. (Patient not taking: Reported on 09/03/2021) 420 mL 0     Musculoskeletal: Strength & Muscle Tone: within normal limits Gait & Station: normal Patient leans: N/A    Psychiatric Specialty Exam:  Presentation  General Appearance: Fairly Groomed; Appropriate for Environment  Eye Contact:Good  Speech:Clear and Coherent  Speech Volume:Normal  Handedness:No data recorded  Mood and Affect  Mood:Depressed; Anxious  Affect:Congruent; Constricted   Thought Process  Thought Processes:Coherent; Linear  Duration of Psychotic Symptoms: N/A  Past Diagnosis of Schizophrenia or Psychoactive disorder: No  Descriptions of Associations:Intact  Orientation:Full (Time, Place and Person)  Thought Content:No data recorded  Hallucinations:Hallucinations: None  Ideas of Reference:None  Suicidal Thoughts:Suicidal Thoughts: Has passive suicidal  thoughts, without intent or plan  Homicidal Thoughts:Homicidal Thoughts: No   Sensorium  Memory:Immediate Good; Recent Good  Judgment:Fair  Insight:Fair   Executive Functions  Concentration:Good  Attention Span:Good  Recall:No data recorded  [place] recorded  Language:Good   Psychomotor Activity  Psychomotor Activity:Psychomotor Activity: Normal   Assets  Assets:No data recorded   Sleep  Sleep:Sleep: Poor    Physical Exam: Physical Exam HENT:     Head: Normocephalic and atraumatic.  Pulmonary:     Effort: Pulmonary effort is normal.  Musculoskeletal:     Right foot: Swelling and tenderness present.     Left foot: Swelling and tenderness present.  Feet:     Right foot:     Skin integrity: No erythema or warmth.  Left foot:     Skin integrity: No erythema or warmth.  Neurological:     General: No focal deficit present.     Mental Status: He is alert and oriented to person, place, and time.     Motor: No weakness.     Gait: Gait normal.    Review of Systems  Constitutional:  Positive for weight loss. Negative for fever.  Gastrointestinal:  Positive for abdominal pain, diarrhea, nausea and vomiting.   Blood pressure 103/84, pulse 92, temperature 98 F (36.7 C), temperature source Oral, resp. rate 16, height 6\' 3"  (1.905 m), weight 57.6 kg, SpO2 100 %. Body mass index is 15.87 kg/m.  Treatment Plan Summary: Daily contact with patient to assess and evaluate symptoms and progress in treatment  ASSESSMENT Brent Rogers is a 55 year old male with a history of MDD and pancreatic insufficiency secondary to a Whipple procedure in 2015 who presents voluntarily under no acute distress for suicidal ideation with plan. There are no specific predisposing or precipitating factors. Perpetuating factors include ongoing pancreatic insufficiency, ineffective medications. Protective factors include close relationships with family members.    PLAN Safety and Monitoring: Voluntary admission to inpatient psychiatric unit for safety, stabilization and treatment Daily contact with patient to assess and evaluate symptoms and progress in treatment Patient's case to be discussed in multi-disciplinary team meeting Observation Level : q15 minute checks Vital signs: q12 hours Precautions: suicide, elopement, and assault   Psychiatric Problems MDD recurrent, severe  GAD  - Start Effexor 37.5 mg once daily, increase to 75 mg once daily on 12-8 - Start Remeron 15 mg qhs - Hydroxyzine PRN for anxiety - Trazodone PRN for sleep   Medical Problems Pancreatic insufficiency secondary to whipple procedure GERD HTN - Continue home creon  - Continue home pepto bismol, pepcid,  - Continue home amlodipine 5mg  - Start Ensure BID - Continue home zofran PRN  PRNs Tylenol 650 mg for mild pain Maalox/Mylanta 30 mL for indigestion Hydroxyzine 25 mg tid for anxiety Milk of Magnesia 30 mL for constipation Trazodone 50 mg for sleep   4. Discharge Planning: Social work and case management to assist with discharge planning and identification of hospital follow-up needs prior to discharge Estimated LOS: 5-7 days Discharge Concerns: Need to establish a safety plan; Medication compliance and effectiveness Discharge Goals: Return home with outpatient referrals for mental health follow-up including medication management/psychotherapy   Observation Level/Precautions:  15 minute checks  Laboratory:  CBC Chemistry Profile HbAIC UDS Vitamin B-12  Psychotherapy:   group therapy  Medications:  effexor, remeron  Consultations:    Discharge Concerns:  N/A  Estimated LOS: 2-3 days  Other:     Physician Treatment Plan for Primary Diagnosis: Major depressive disorder, recurrent episode, severe ([place]) Long Term Goal(s): Improvement in symptoms so as ready for discharge  Short Term Goals: Ability to identify changes in lifestyle to reduce  recurrence of condition will improve, Ability to verbalize feelings will improve, Ability to disclose and discuss suicidal ideas, and Ability to identify and develop effective coping behaviors will improve    I certify that inpatient services furnished can reasonably be expected to improve the patient's condition.    Christoper Allegra, MD 12/7/20226:08 PM  Total Time Spent in Direct Patient Care:  I personally spent 60 minutes on the unit in direct patient care. The direct patient care time included face-to-face time with the patient, reviewing the patient's chart, communicating with other professionals, and coordinating care. Greater than 50%  of this time was spent in counseling or coordinating care with the patient regarding goals of hospitalization, psycho-education, and discharge planning needs.  I personally was present and performed or re-performed the history, physical exam and medical decision-making activities of this service and have verified that the service and findings are accurately documented in the student's note, , as addended by me or notated below:  I directly edited the note, as above.   Janine Limbo, MD Psychiatrist

## 2021-09-04 NOTE — Progress Notes (Signed)
Pt denies SI/HI/AVH and verbally agrees to approach staff if these become apparent or before harming themselves/others. Rates depression 5/10. Rates anxiety 5/10. Rates pain 5/10 in side. Pt made the statement that he has nothing to do here. Pt wrote that he does not think that this place is the best place for him right now. Pt goal is to have more positive thoughts but is wanting to leave. Pt has been isolative in his room for the majority of the day but did go down for lunch. Scheduled medications administered to Pt, per MD orders. RN provided support and encouragement to Pt. Q15 min safety checks implemented and continued. Pt safe on the unit. RN will continue to monitor and intervene as needed.   09/04/21 0827  Psych Admission Type (Psych Patients Only)  Admission Status Voluntary  Psychosocial Assessment  Patient Complaints Anxiety;Depression;Other (Comment) (pain)  Eye Contact Brief  Facial Expression Flat;Sad  Affect Sad;Flat  Speech Logical/coherent;Soft  Interaction Assertive;Isolative  Motor Activity Slow  Appearance/Hygiene Unremarkable  Behavior Characteristics Cooperative;Appropriate to situation  Mood Depressed;Pleasant;Irritable  Thought Process  Coherency WDL  Content WDL  Delusions None reported or observed  Perception WDL  Hallucination None reported or observed  Judgment Impaired  Confusion None  Danger to Self  Current suicidal ideation? Denies  Self-Injurious Behavior No self-injurious ideation or behavior indicators observed or expressed   Agreement Not to Harm Self Yes  Description of Agreement Verbal contract  Danger to Others  Danger to Others None reported or observed

## 2021-09-04 NOTE — BH IP Treatment Plan (Signed)
Interdisciplinary Treatment and Diagnostic Plan Update  09/04/2021 Time of Session: 9:15am  Brent Rogers MRN: [id number]  Principal Diagnosis: <principal problem not specified>  Secondary Diagnoses: Active Problems:   * No active hospital problems. *   Current Medications:  Current Facility-Administered Medications  Medication Dose Route Frequency Provider Last Rate Last Admin   acetaminophen (TYLENOL) tablet 650 mg  650 mg Oral Q6H PRN Starkes-Perry, Gayland Curry, FNP       alum & mag hydroxide-simeth (MAALOX/MYLANTA) 200-200-20 MG/5ML suspension 30 mL  30 mL Oral Q4H PRN Starkes-Perry, Gayland Curry, FNP       amLODipine (NORVASC) tablet 5 mg  5 mg Oral Daily Suella Broad, FNP   5 mg at 09/04/21 4403   bismuth subsalicylate (PEPTO BISMOL) chewable tablet 524 mg  524 mg Oral Q6H PRN Massengill, Ovid Curd, MD       famotidine (PEPCID) tablet 20 mg  20 mg Oral BID Suella Broad, FNP   20 mg at 09/04/21 4742   feeding supplement (ENSURE ENLIVE / ENSURE PLUS) liquid 237 mL  237 mL Oral BID BM Massengill, Ovid Curd, MD   237 mL at 09/04/21 0940   hydrOXYzine (ATARAX) tablet 25 mg  25 mg Oral TID PRN Prescilla Sours, PA-C   25 mg at 09/04/21 0825   loperamide (IMODIUM) capsule 2 mg  2 mg Oral PRN Margorie John W, PA-C       magnesium hydroxide (MILK OF MAGNESIA) suspension 30 mL  30 mL Oral Daily PRN Suella Broad, FNP       mirtazapine (REMERON) tablet 15 mg  15 mg Oral QHS France Ravens, MD       ondansetron (ZOFRAN-ODT) disintegrating tablet 4 mg  4 mg Oral Q8H PRN Prescilla Sours, PA-C   4 mg at 09/04/21 0827   traZODone (DESYREL) tablet 50 mg  50 mg Oral QHS PRN Prescilla Sours, PA-C   50 mg at 09/03/21 2227   venlafaxine XR (EFFEXOR-XR) 24 hr capsule 37.5 mg  37.5 mg Oral Q lunch France Ravens, MD       Followed by   Derrill Memo ON 09/05/2021] venlafaxine XR (EFFEXOR-XR) 24 hr capsule 75 mg  75 mg Oral Q breakfast France Ravens, MD       PTA Medications: Medications Prior to Admission   Medication Sig Dispense Refill Last Dose   acetaminophen (TYLENOL) 500 MG tablet Take 1,500 mg by mouth every 6 (six) hours as needed for mild pain.      amLODipine (NORVASC) 5 MG tablet Take 1 tablet (5 mg total) by mouth daily. 90 tablet 2    bismuth subsalicylate (PEPTO BISMOL) 262 MG/15ML suspension Take 30 mLs by mouth every 6 (six) hours as needed for indigestion.      DULoxetine (CYMBALTA) 20 MG capsule Take 1 capsule (20 mg total) by mouth daily. (Patient not taking: Reported on 09/03/2021) 30 capsule 3    famotidine (PEPCID) 20 MG tablet Take 1 tablet (20 mg total) by mouth 2 (two) times daily. (Patient not taking: Reported on 09/03/2021) 30 tablet 0    ferrous sulfate 325 (65 FE) MG tablet Take 1 tablet (325 mg total) by mouth daily. (Patient taking differently: Take 325 mg by mouth every other day.) 100 tablet 1    ondansetron (ZOFRAN) 4 MG tablet Take 1 tablet (4 mg total) by mouth every 6 (six) hours. (Patient not taking: Reported on 09/03/2021) 12 tablet 0    Pancrelipase, Lip-Prot-Amyl, (CREON) 24000-76000 units CPEP Take  1 capsule (24,000 Units total) by mouth with breakfast, with lunch, and with evening meal. (Patient not taking: Reported on 09/03/2021) 270 capsule 1    pantoprazole (PROTONIX) 40 MG tablet Take 1 tablet (40 mg total) by mouth daily. (Patient not taking: Reported on 09/03/2021) 30 tablet 3    promethazine (PHENERGAN) 25 MG tablet Take 1 tablet (25 mg total) by mouth every 6 (six) hours as needed for nausea or vomiting. (Patient not taking: Reported on 09/03/2021) 15 tablet 0    sucralfate (CARAFATE) 1 GM/10ML suspension Take 10 mLs (1 g total) by mouth 4 (four) times daily -  with meals and at bedtime. (Patient not taking: Reported on 09/03/2021) 420 mL 0     Patient Stressors: Health problems    Patient Strengths: Average or above average intelligence  Capable of independent living  General fund of knowledge  Motivation for treatment/growth  Supportive family/friends    Treatment Modalities: Medication Management, Group therapy, Case management,  1 to 1 session with clinician, Psychoeducation, Recreational therapy.   Physician Treatment Plan for Primary Diagnosis: <principal problem not specified> Long Term Goal(s):     Short Term Goals:    Medication Management: Evaluate patient's response, side effects, and tolerance of medication regimen.  Therapeutic Interventions: 1 to 1 sessions, Unit Group sessions and Medication administration.  Evaluation of Outcomes: Not Met  Physician Treatment Plan for Secondary Diagnosis: Active Problems:   * No active hospital problems. *  Long Term Goal(s):     Short Term Goals:       Medication Management: Evaluate patient's response, side effects, and tolerance of medication regimen.  Therapeutic Interventions: 1 to 1 sessions, Unit Group sessions and Medication administration.  Evaluation of Outcomes: Not Met   RN Treatment Plan for Primary Diagnosis: <principal problem not specified> Long Term Goal(s): Knowledge of disease and therapeutic regimen to maintain health will improve  Short Term Goals: Ability to remain free from injury will improve, Ability to participate in decision making will improve, Ability to verbalize feelings will improve, Ability to disclose and discuss suicidal ideas, and Ability to identify and develop effective coping behaviors will improve  Medication Management: RN will administer medications as ordered by provider, will assess and evaluate patient's response and provide education to patient for prescribed medication. RN will report any adverse and/or side effects to prescribing provider.  Therapeutic Interventions: 1 on 1 counseling sessions, Psychoeducation, Medication administration, Evaluate responses to treatment, Monitor vital signs and CBGs as ordered, Perform/monitor CIWA, COWS, AIMS and Fall Risk screenings as ordered, Perform wound care treatments as  ordered.  Evaluation of Outcomes: Not Met   LCSW Treatment Plan for Primary Diagnosis: <principal problem not specified> Long Term Goal(s): Safe transition to appropriate next level of care at discharge, Engage patient in therapeutic group addressing interpersonal concerns.  Short Term Goals: Engage patient in aftercare planning with referrals and resources, Increase social support, Increase emotional regulation, Facilitate acceptance of mental health diagnosis and concerns, Identify triggers associated with mental health/substance abuse issues, and Increase skills for wellness and recovery  Therapeutic Interventions: Assess for all discharge needs, 1 to 1 time with Social worker, Explore available resources and support systems, Assess for adequacy in community support network, Educate family and significant other(s) on suicide prevention, Complete Psychosocial Assessment, Interpersonal group therapy.  Evaluation of Outcomes: Not Met   Progress in Treatment: Attending groups: Yes. Participating in groups: Yes. Taking medication as prescribed: Yes. Toleration medication: Yes. Family/Significant other contact made: Yes, individual(s) contacted:  If consents are provided  Patient understands diagnosis: Yes. Discussing patient identified problems/goals with staff: Yes. Medical problems stabilized or resolved: Yes. Denies suicidal/homicidal ideation: Yes. Issues/concerns per patient self-inventory: No.   New problem(s) identified: No, Describe:  None   New Short Term/Long Term Goal(s): medication stabilization, elimination of SI thoughts, development of comprehensive mental wellness plan.   Patient Goals: "To increase my positive thoughts"   Discharge Plan or Barriers: Patient recently admitted. CSW will continue to follow and assess for appropriate referrals and possible discharge planning.   Reason for Continuation of Hospitalization: Anxiety Depression Hallucinations Medication  stabilization Suicidal ideation  Estimated Length of Stay: 3 to 5 days    Scribe for Treatment Team: Darleen Crocker, Latanya Presser 09/04/2021 11:33 AM

## 2021-09-04 NOTE — Progress Notes (Signed)
NUTRITION ASSESSMENT RD working remotely.  Pt identified as at risk on the Malnutrition Screen Tool  INTERVENTION:  - will order Ensure Enlive po BID, each supplement provides 350 kcal and 20 grams of protein - [facility] staff to continue to encourage PO intakes of meals, supplements, and snacks.  NUTRITION DIAGNOSIS: Unintentional weight loss related to sub-optimal intake as evidenced by pt report.   Goal: Pt to meet >/= 90% of their estimated nutrition needs.  Monitor:  PO intake  Assessment:  Patient admitted due to SI and intermittent auditory and visual hallucinations. He reported to ED staff that he had 3 falls in the past 1 week, that he is a hoarder, and that he sometimes has incontinency of stool and urine.   Weight yesterday documented as 127 lb and appears to be a stated weight. Weight on 08/13/21 was 140 lb. This would indicate 13 lb weight loss (9.3% body weight) in the past 3 weeks.   55 y.o. male  Height: Ht Readings from Last 1 Encounters:  09/03/21 6\' 3"  (1.905 m)    Weight: Wt Readings from Last 1 Encounters:  09/03/21 57.6 kg    Weight Hx: Wt Readings from Last 10 Encounters:  09/03/21 57.6 kg  09/02/21 68 kg  08/24/21 68 kg  08/13/21 63.5 kg  07/05/21 70.3 kg  06/03/21 68 kg  03/21/21 59.9 kg  03/06/21 60.7 kg  02/02/21 70.3 kg  01/19/21 65.8 kg    BMI:  Body mass index is 15.87 kg/m. Pt meets criteria for underweight based on current BMI.  Estimated Nutritional Needs: Kcal: 25-30 kcal/kg Protein: > 1 gram protein/kg Fluid: 1 ml/kcal  Diet Order:  Diet Order             Diet regular Room service appropriate? Yes; Fluid consistency: Thin  Diet effective now                  Pt is also offered choice of unit snacks mid-morning and mid-afternoon.  Pt is eating as desired.   Lab results and medications reviewed.      Jarome Matin, MS, RD, LDN, CNSC Inpatient Clinical Dietitian RD pager # available in [place]  After  hours/weekend pager # available in [facility]

## 2021-09-04 NOTE — Group Note (Signed)
LCSW Group Therapy Note  Group Date: 09/04/2021 Start Time: 1300 End Time: 1400   Type of Therapy and Topic:  Group Therapy - Healthy vs Unhealthy Coping Skills  Participation Level:  Did Not Attend   Description of Group The focus of this group was to determine what unhealthy coping techniques typically are used by group members and what healthy coping techniques would be helpful in coping with various problems. Patients were guided in becoming aware of the differences between healthy and unhealthy coping techniques. Patients were asked to identify 2-3 healthy coping skills they would like to learn to use more effectively.  Therapeutic Goals Patients learned that coping is what human beings do all day long to deal with various situations in their lives Patients defined and discussed healthy vs unhealthy coping techniques Patients identified their preferred coping techniques and identified whether these were healthy or unhealthy Patients determined 2-3 healthy coping skills they would like to become more familiar with and use more often. Patients provided support and ideas to each other   Summary of Patient Progress: Did not attend   Therapeutic Modalities Cognitive [place], LCSW 09/04/2021  1:42 PM

## 2021-09-04 NOTE — Progress Notes (Signed)
Psychoeducational Group Note  Date:  09/04/2021 Time: 2139  Group Topic/Focus:  Relapse Prevention Planning:   The focus of this group is to define relapse and discuss the need for planning to combat relapse.  Participation Level: Did Not Attend  Participation Quality:  Not Applicable  Affect:  Not Applicable  Cognitive:  Not Applicable  Insight:  Not Applicable  Engagement in Group: Not Applicable  Additional Comments:  The patient did not attend group this evening.   Archie Balboa S 09/04/2021, 9:39 PM

## 2021-09-04 NOTE — Group Note (Signed)
Recreation Therapy Group Note   Group Topic:Stress Management  Group Date: 09/04/2021 Start Time: 0930 End Time: 0950 Facilitators: Victorino Sparrow, LRT,CTRS Location: [address] Dayroom  Goal Area(s) Addresses:  Patient will actively participate in stress management techniques presented during session.  Patient will successfully identify benefit of practicing stress management post d/c.   Group Description: Guided Imagery. LRT provided education, instruction, and demonstration on practice of visualization via guided imagery. Patient was asked to participate in the technique introduced during session. LRT debriefed including topics of mindfulness, stress management and specific scenarios each patient could use these techniques. Patients were given suggestions of ways to access scripts post d/c and encouraged to explore Youtube and other apps available on smartphones, tablets, and computers.    Affect/Mood: N/A   Participation Level: Did not attend    Clinical Observations/Individualized Feedback:     Plan: Continue to engage patient in RT group sessions 2-3x/week.   Burl Tauzin, Glennis Brink 09/04/2021 12:46 PM

## 2021-09-04 NOTE — [facility] Suicide Risk Assessment (Signed)
[facility] Admission Suicide Risk Assessment   Nursing information obtained from:  Patient Demographic factors:  Male, Living alone, Unemployed Current Mental Status:  Suicidal ideation indicated by patient Loss Factors:  NA Historical Factors:  NA Risk Reduction Factors:  Sense of responsibility to family  Total Time spent with patient: 45 minutes Principal Problem: Major depressive disorder, recurrent episode, severe (HCC) Diagnosis:  Principal Problem:   Major depressive disorder, recurrent episode, severe ([place]) Active Problems:   GAD (generalized anxiety disorder)  Subjective Data:   Mr. 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  He reports that he has had ongoing somatic symptoms of pancreatic insufficiency over the past two years, including nausea, vomiting, diarrhea, weight loss, decreased appetite, and bowel incontinence that have impaired his lifestyle. He reports that his medical problems and subsequent physical decline have caused his depression to worsen and have suicidal thoughts, as he reports that life is not worth living in his current medically-ill state.    He reports worsening disappointment and sadness about what his life has become and started having suicidal thoughts one week ago. He reports that the thoughts started out as passive because he no longer sees the point of living but since then have become more active. He was thinking about ways to end his life, including the use of a knife or pills. Per chart review, the patient had a knife in his hand and was going to cut himself when he decided to call 911 for help. He also reports anhedonia, decreased energy, some trouble concentrating, decreased appetite, sleep disturbances, and increasing self-isolation. He sometimes takes OTC sleep medications. He reports having  suicidal thoughts, that are passive, without intent or plan. Denies HI.   Patient has multiple risk facts that elevate his suicide risk including current suicidal thoughts, recent suicidal thoughts, current medical illness, pain, age, gender, isolation, and anxious distress.   Continued Clinical Symptoms:  Alcohol Use Disorder Identification Test Final Score (AUDIT): 3 The "Alcohol Use Disorders Identification Test", Guidelines for Use in Primary Care, Second Edition.  World Pharmacologist [facility]). Score between 0-7:  no or low risk or alcohol related problems. Score between 8-15:  moderate risk of alcohol related problems. Score between 16-19:  high risk of alcohol related problems. Score 20 or above:  warrants further diagnostic evaluation for alcohol dependence and treatment.   CLINICAL FACTORS:   Severe Anxiety and/or Agitation Panic Attacks Depression:   Anhedonia Hopelessness Insomnia Chronic Pain More than one psychiatric diagnosis   Musculoskeletal: Strength & Muscle Tone: within normal limits Gait & Station: normal Patient leans: N/A  Psychiatric Specialty Exam:  Presentation  General Appearance: Casual  Eye Contact:Fleeting  Speech:Normal Rate  Speech Volume:Decreased  Handedness:No data recorded  Mood and Affect  Mood:Depressed; Anxious  Affect:Constricted   Thought Process  Thought Processes:Linear  Descriptions of Associations:Intact  Orientation:Full (Time, Place and Person)  Thought Content:Logical  History of Schizophrenia/Schizoaffective disorder:No  Duration of Psychotic Symptoms:N/A  Hallucinations:Hallucinations: None  Ideas of Reference:None  Suicidal Thoughts:Suicidal Thoughts: Yes, Passive SI Passive Intent and/or Plan: Without Intent; Without Plan  Homicidal Thoughts:Homicidal Thoughts: No   Sensorium  Memory:Immediate Good; Recent Good; Remote Good  Judgment:Fair  Insight:Fair   Executive Functions   Concentration:Fair  Attention Span:Fair  Recall:No data recorded Novelty recorded Language:Good   Psychomotor Activity  Psychomotor Activity:Psychomotor  Activity: Normal   Assets  Assets:No data recorded  Sleep  Sleep:Sleep: Poor    Physical Exam: Physical Exam ROS Blood pressure 103/84, pulse 92, temperature 98 F (36.7 C), temperature source Oral, resp. rate 16, height 6\' 3"  (1.905 m), weight 57.6 kg, SpO2 100 %. Body mass index is 15.87 kg/m.   COGNITIVE FEATURES THAT CONTRIBUTE TO RISK:  None    SUICIDE RISK:   Moderate:  Frequent suicidal ideation with limited intensity, and duration, some specificity in terms of plans, no associated intent, good self-control, limited dysphoria/symptomatology, some risk factors present, and identifiable protective factors, including available and accessible social support.  PLAN OF CARE:   See H&P for plan.   I certify that inpatient services furnished can reasonably be expected to improve the patient's condition.   Christoper Allegra, MD 09/04/2021, 6:11 PM

## 2021-09-05 DIAGNOSIS — F411 Generalized anxiety disorder: Secondary | ICD-10-CM

## 2021-09-05 LAB — URINALYSIS, ROUTINE W REFLEX MICROSCOPIC
Bacteria, UA: NONE SEEN
Bilirubin Urine: NEGATIVE
Glucose, UA: NEGATIVE mg/dL
Hgb urine dipstick: NEGATIVE
Leukocytes,Ua: NEGATIVE
Nitrite: NEGATIVE
Protein, ur: NEGATIVE mg/dL
Specific Gravity, Urine: 1.025 (ref 1.005–1.030)
pH: 6 (ref 5.0–8.0)

## 2021-09-05 LAB — COMPREHENSIVE METABOLIC PANEL
ALT: 27 U/L (ref 0–44)
AST: 40 U/L (ref 15–41)
Albumin: 2.3 g/dL — ABNORMAL LOW (ref 3.5–5.0)
Alkaline Phosphatase: 144 U/L — ABNORMAL HIGH (ref 38–126)
Anion gap: 5 (ref 5–15)
BUN: <5 mg/dL — ABNORMAL LOW (ref 6–20)
CO2: 26 mmol/L (ref 22–32)
Calcium: 8 mg/dL — ABNORMAL LOW (ref 8.9–10.3)
Chloride: 107 mmol/L (ref 98–111)
Creatinine, Ser: 0.66 mg/dL (ref 0.61–1.24)
GFR, Estimated: >60 mL/min (ref >60)
Glucose, Bld: 85 mg/dL (ref 70–99)
Potassium: 4.1 mmol/L (ref 3.5–5.1)
Sodium: 138 mmol/L (ref 135–145)
Total Bilirubin: 0.2 mg/dL — ABNORMAL LOW (ref 0.3–1.2)
Total Protein: 5.1 g/dL — ABNORMAL LOW (ref 6.5–8.1)

## 2021-09-05 LAB — BRAIN NATRIURETIC PEPTIDE: B Natriuretic Peptide: 83.1 pg/mL (ref 0.0–100.0)

## 2021-09-05 LAB — URIC ACID: Uric Acid, Serum: 4.9 mg/dL (ref 3.7–8.6)

## 2021-09-05 MED ORDER — SUCRALFATE 1 G PO TABS
1.0000 g | ORAL_TABLET | Freq: Three times a day (TID) | ORAL | Status: DC
  Administered 2021-09-05 – 2021-09-06 (×5): 1 g via ORAL
  Filled 2021-09-05 (×9): qty 1

## 2021-09-05 MED ORDER — IBUPROFEN 200 MG PO TABS
200.0000 mg | ORAL_TABLET | Freq: Four times a day (QID) | ORAL | Status: DC | PRN
  Administered 2021-09-05 – 2021-09-06 (×2): 200 mg via ORAL
  Filled 2021-09-05 (×2): qty 1

## 2021-09-05 NOTE — [facility] Group Notes (Signed)
Patient did not attend the relaxation group. 

## 2021-09-05 NOTE — Progress Notes (Signed)
[facility] MD Progress Note  09/05/2021 11:36 AM Brent Rogers  MRN:  [id number] Subjective:   Mr. 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   Today he reports mild improvement in his mood from yesterday. He feels more rested and feels like his mind is a bit more clear. He rates his mood 5/10 and his anxiety 2/10. His sleep is improved with the Remeron but was disrupted last night due to having a roommate. Reports improvement in appetite and ate breakfast this morning but has been having abdominal pain since finishing his meal.  He has ongoing abdominal pain which he currently rates 7/10. Denies SI/HI/AVH.   He continues to be bothered by the swelling in his feet, which has also started affecting his hands. He reports pain in both feet and both hands along with new onset hypopigmented patches on his forearms bilaterally as of this morning. The pain in his feet is a pressure sensation and in his hands is tingling in nature.      Principal Problem: Major depressive disorder, recurrent episode, severe ([place]) Diagnosis: Principal Problem:   Major depressive disorder, recurrent episode, severe (HCC) Active Problems:   GAD (generalized anxiety disorder)   Total Time spent with patient: 30 minutes  Past Psychiatric History: See H&P  Past Medical History:  Past Medical History:  Diagnosis Date   Benign tumor of endocrine pancreas    Chronic pancreatitis ([place])    S/P Whipple   Foot ulcer with fat layer exposed ([place])    Hypertension    Migraine    "@ least once/month" (11/12/2015)   Pancreatic abnormality    CT  shows mass   Pneumonia 11/12/2015   Scoliosis     Past Surgical History:  Procedure Laterality Date   BACK SURGERY     COLONOSCOPY WITH PROPOFOL N/A 03/11/2021   Procedure: COLONOSCOPY WITH PROPOFOL;  Surgeon: Carol Ada, MD;  Location: [place];  Service: Endoscopy;  Laterality: N/A;   ESOPHAGOGASTRODUODENOSCOPY (EGD) WITH PROPOFOL N/A 03/11/2021   Procedure: ESOPHAGOGASTRODUODENOSCOPY (EGD) WITH PROPOFOL;  Surgeon: Carol Ada, MD;  Location: [place];  Service: Endoscopy;  Laterality: N/A;   EUS N/A 09/21/2013   Procedure: ESOPHAGEAL ENDOSCOPIC ULTRASOUND (EUS) RADIAL;  Surgeon: Beryle Beams, MD;  [facility];  Service: Endoscopy;  Laterality: N/A;   EUS N/A 09/30/2013   Procedure: UPPER ENDOSCOPIC ULTRASOUND (EUS) LINEAR;  Surgeon: Beryle Beams, MD;  [facility];  Service: Endoscopy;  Laterality: N/A;   FINE NEEDLE ASPIRATION N/A 09/21/2013   Procedure: FINE NEEDLE ASPIRATION (FNA) LINEAR;  Surgeon: Beryle Beams, MD;  [facility];  Service: Endoscopy;  Laterality: N/A;   FINGER FRACTURE SURGERY Left 1995   5th digit   FRACTURE SURGERY     HEMOSTASIS CLIP PLACEMENT  03/11/2021   Procedure: HEMOSTASIS CLIP PLACEMENT;  Surgeon: Carol Ada, MD;  Location: [place];  Service: Endoscopy;;   HEMOSTASIS CONTROL  03/11/2021   Procedure: HEMOSTASIS CONTROL;  Surgeon: Carol Ada, MD;  Location: [place];  Service: Endoscopy;;   KNEE ARTHROSCOPY Bilateral 1987-1989   right-left   LAPAROSCOPY N/A 12/01/2013   Procedure: LAPAROSCOPY DIAGNOSTIC PANCREATICODUODENECTOMY WITH BILIARY AND PANCREATIC STENTS;  Surgeon: Stark Klein, MD;  [facility];  Service: General;  Laterality: N/A;   LUMBAR DISC SURGERY  1990's   SAVORY DILATION N/A 03/11/2021  Procedure: SAVORY DILATION;  Surgeon: Carol Ada, MD;  Location: [place];  Service: Endoscopy;  Laterality: N/A;   WHIPPLE PROCEDURE  12/01/2013   Family History:  Family History  Problem Relation Age of Onset   Cancer Mother        unsure   COPD Mother    Hypertension Mother    Hypertension Father    COPD Father    CAD Sister        possible has stent per pt   Family Psychiatric  History: See  H&P Social History:  Social History   Substance and Sexual Activity  Alcohol Use Yes   Alcohol/week: 2.0 standard drinks   Types: 2 Cans of beer per week   Comment: monthly     Social History   Substance and Sexual Activity  Drug Use No    Social History   Socioeconomic History   Marital status: Divorced    Spouse name: Not on file   Number of children: Not on file   Years of education: Not on file   Highest education level: Not on file  Occupational History   Not on file  Tobacco Use   Smoking status: Every Day    Packs/day: 1.00    Types: Cigarettes   Smokeless tobacco: Former    Types: Snuff   Tobacco comments:    "used snuff 10-15 years in my 20s-30s"  Vaping Use   Vaping Use: Never used  Substance and Sexual Activity   Alcohol use: Yes    Alcohol/week: 2.0 standard drinks    Types: 2 Cans of beer per week    Comment: monthly   Drug use: No   Sexual activity: Not Currently    Partners: Male  Other Topics Concern   Not on file  Social History Narrative   Not on file   Social Determinants of Health   Financial Resource Strain: Not on file  Food Insecurity: Not on file  Transportation Needs: Not on file  Physical Activity: Not on file  Stress: Not on file  Social Connections: Not on file   Additional Social History:                         Sleep: Fair  Appetite:  Fair  Current Medications: Current Facility-Administered Medications  Medication Dose Route Frequency Provider Last Rate Last Admin   alum & mag hydroxide-simeth (MAALOX/MYLANTA) 200-200-20 MG/5ML suspension 30 mL  30 mL Oral Q4H PRN Starkes-Perry, Gayland Curry, FNP       amLODipine (NORVASC) tablet 5 mg  5 mg Oral Daily Suella Broad, FNP   5 mg at 09/05/21 9024   bismuth subsalicylate (PEPTO BISMOL) chewable tablet 524 mg  524 mg Oral Q6H PRN Massengill, Ovid Curd, MD       famotidine (PEPCID) tablet 20 mg  20 mg Oral BID Suella Broad, FNP   20 mg at 09/05/21 0752    feeding supplement (ENSURE ENLIVE / ENSURE PLUS) liquid 237 mL  237 mL Oral BID BM Massengill, Ovid Curd, MD   237 mL at 09/04/21 0940   hydrOXYzine (ATARAX) tablet 25 mg  25 mg Oral TID PRN Prescilla Sours, PA-C   25 mg at 09/05/21 0753   ibuprofen (ADVIL) tablet 200 mg  200 mg Oral Q6H PRN France Ravens, MD   200 mg at 09/05/21 1007   lipase/protease/amylase (CREON) capsule 24,000 Units  24,000 Units Oral TID AC Janine Limbo, MD   24,000  Units at 09/05/21 1121   loperamide (IMODIUM) capsule 2 mg  2 mg Oral PRN Prescilla Sours, PA-C   2 mg at 09/04/21 2202   magnesium hydroxide (MILK OF MAGNESIA) suspension 30 mL  30 mL Oral Daily PRN Suella Broad, FNP       mirtazapine (REMERON) tablet 15 mg  15 mg Oral QHS France Ravens, MD   15 mg at 09/04/21 2201   ondansetron (ZOFRAN-ODT) disintegrating tablet 4 mg  4 mg Oral Q8H PRN Prescilla Sours, PA-C   4 mg at 09/04/21 2203   traZODone (DESYREL) tablet 50 mg  50 mg Oral QHS PRN Prescilla Sours, PA-C   50 mg at 09/03/21 2227   venlafaxine XR (EFFEXOR-XR) 24 hr capsule 75 mg  75 mg Oral Q breakfast France Ravens, MD   75 mg at 09/05/21 1121    Lab Results:  Results for orders placed or performed during the hospital encounter of 09/03/21 (from the past 48 hour(s))  Lipid panel     Status: None   Collection Time: 09/04/21  6:45 AM  Result Value Ref Range   Cholesterol 125 0 - 200 mg/dL   Triglycerides 46 <150 mg/dL   HDL 64 >40 mg/dL   Total CHOL/HDL Ratio 2.0 RATIO   VLDL 9 0 - 40 mg/dL   LDL Cholesterol 52 0 - 99 mg/dL    Comment:        Total Cholesterol/HDL:CHD Risk Coronary Heart Disease Risk Table                     Men   Women  1/2 Average Risk   3.4   3.3  Average Risk       5.0   4.4  2 X Average Risk   9.6   7.1  3 X Average Risk  23.4   11.0        Use the calculated Patient Ratio above and the CHD Risk Table to determine the patient's CHD Risk.        ATP III CLASSIFICATION (LDL):  <100     mg/dL   Optimal  100-129  mg/dL    Near or Above                    Optimal  130-159  mg/dL   Borderline  160-189  mg/dL   High  >190     mg/dL   Very High Performed at [facility] [address], [place], [place] [address]   Hemoglobin A1c     Status: None   Collection Time: 09/04/21  6:45 AM  Result Value Ref Range   Hgb A1c MFr Bld 5.3 4.8 - 5.6 %    Comment: (NOTE) Pre diabetes:          5.7%-6.4%  Diabetes:              >6.4%  Glycemic control for   <7.0% adults with diabetes    Mean Plasma Glucose 105.41 mg/dL    Comment: Performed at [facility] [address] [place], [place] [address]  TSH     Status: None   Collection Time: 09/04/21  6:45 AM  Result Value Ref Range   TSH 2.392 0.350 - 4.500 uIU/mL    Comment: Performed by a 3rd Generation assay with a functional sensitivity of <=0.01 uIU/mL. Performed at [facility], [place] [address], [place], [place] [address]  Comprehensive metabolic panel     Status: Abnormal   Collection Time: 09/05/21  6:29 AM  Result Value Ref Range   Sodium 138 135 - 145 mmol/L   Potassium 4.1 3.5 - 5.1 mmol/L   Chloride 107 98 - 111 mmol/L   CO2 26 22 - 32 mmol/L   Glucose, Bld 85 70 - 99 mg/dL    Comment: Glucose reference range applies only to samples taken after fasting for at least 8 hours.   BUN <5 (L) 6 - 20 mg/dL   Creatinine, Ser 0.66 0.61 - 1.24 mg/dL   Calcium 8.0 (L) 8.9 - 10.3 mg/dL   Total Protein 5.1 (L) 6.5 - 8.1 g/dL   Albumin 2.3 (L) 3.5 - 5.0 g/dL   AST 40 15 - 41 U/L   ALT 27 0 - 44 U/L   Alkaline Phosphatase 144 (H) 38 - 126 U/L   Total Bilirubin 0.2 (L) 0.3 - 1.2 mg/dL   GFR, Estimated >60 >60 mL/min    Comment: (NOTE) Calculated using the CKD-EPI Creatinine Equation (2021)    Anion gap 5 5 - 15    Comment: Performed at [facility], [place] [address] [place], [place] [address]    Blood Alcohol level:  Lab Results  Component Value Date   ETH <10 09/02/2021   ETH 15 (H)  38/18/2993    Metabolic Disorder Labs: Lab Results  Component Value Date   HGBA1C 5.3 09/04/2021   MPG 105.41 09/04/2021   MPG 103 03/06/2021   No results found for: PROLACTIN Lab Results  Component Value Date   CHOL 125 09/04/2021   TRIG 46 09/04/2021   HDL 64 09/04/2021   CHOLHDL 2.0 09/04/2021   VLDL 9 09/04/2021   LDLCALC 52 09/04/2021   LDLCALC 48 03/06/2021    Physical Findings:  Musculoskeletal: Strength & Muscle Tone: within normal limits Gait & Station: normal Patient leans: N/A  Psychiatric Specialty Exam:  Presentation  General Appearance: Appropriate for Environment; Casual; Fairly Groomed   Eye Contact:Good   Speech:Clear and Coherent   Speech Volume:Normal   Handedness:No data recorded   Mood and Affect  Mood:Euthymic   Affect:Congruent; Constricted; Appropriate    Thought Process  Thought Processes:Coherent; Linear   Descriptions of Associations:Intact   Orientation:Full (Time, Place and Person)   Thought Content:WDL   History of Schizophrenia/Schizoaffective disorder:No   Duration of Psychotic Symptoms:N/A   Hallucinations:Hallucinations: None  Ideas of Reference:None   Suicidal Thoughts:Suicidal Thoughts: No SI Passive Intent and/or Plan: Without Intent; Without Plan  Homicidal Thoughts:Homicidal Thoughts: No   Sensorium  Memory:Immediate Good; Recent Good   Judgment:Fair   Insight:Fair    Executive Functions  Concentration:Good   Attention Span:Good   Recall:No data recorded  Fund of [place] recorded  Language:Good    Psychomotor Activity  Psychomotor Activity:Psychomotor Activity: Normal   Assets  Assets:No data recorded   Sleep  Sleep:Sleep: Fair    Physical Exam: Physical Exam Constitutional:      Appearance: Normal appearance.  HENT:     Head: Normocephalic and atraumatic.  Abdominal:     Tenderness: There is no right CVA tenderness or left CVA tenderness.   Musculoskeletal:        General: Swelling present.     Comments: Swelling of bilateral feet and hands  Skin:    Findings: Rash present. No erythema.     Comments: Hypopigmented patches on bilateral forearms  Neurological:     General: No focal deficit present.  Mental Status: He is alert and oriented to person, place, and time.   Review of Systems  Constitutional:  Positive for weight loss. Negative for chills and fever.  Cardiovascular:  Negative for chest pain and orthopnea.  Gastrointestinal:  Positive for abdominal pain and diarrhea.  Musculoskeletal:  Positive for back pain.  Skin:  Positive for rash. Negative for itching.  Blood pressure 114/89, pulse (!) 115, temperature 98.1 F (36.7 C), temperature source Oral, resp. rate 16, height 6\' 3"  (1.905 m), weight 57.6 kg, SpO2 100 %. Body mass index is 15.87 kg/m.   Treatment Plan Summary: Daily contact with patient to assess and evaluate symptoms and progress in treatment  At this point in time, patient is psychiatrically stable for discharge.  Patient will require follow-up as an outpatient for hand and feet swelling.  PLAN Safety and Monitoring: Voluntary admission to inpatient psychiatric unit for safety, stabilization and treatment Daily contact with patient to assess and evaluate symptoms and progress in treatment Patient's case to be discussed in multi-disciplinary team meeting Observation Level : q15 minute checks Vital signs: q12 hours Precautions: suicide, elopement, and assault     Psychiatric Problems MDD recurrent, severe  GAD  - Effexor 75 mg once daily - Start Remeron 15 mg qhs - Hydroxyzine PRN for anxiety - Trazodone PRN for sleep   Medical Problems Pancreatic insufficiency secondary to whipple procedure GERD HTN Swelling of bilateral feet and hands - Continue home creon  - Continue home pepto bismol, pepcid,  - Continue home amlodipine 5mg  - Start Ensure BID - Continue home zofran PRN -  IM consult for swelling of bilateral feet and hands 12/8- recommended outpatient follow up with rheumatology and dermatology - BNP, UA, uric acid   PRNs Tylenol 650 mg for mild pain Maalox/Mylanta 30 mL for indigestion Hydroxyzine 25 mg tid for anxiety Milk of Magnesia 30 mL for constipation Trazodone 50 mg for sleep   4. Discharge Planning: Social work and case management to assist with discharge planning and identification of hospital follow-up needs prior to discharge Estimated LOS: 5-7 days Discharge Concerns: Need to establish a safety plan; Medication compliance and effectiveness Discharge Goals: Return home with outpatient referrals for mental health follow-up including medication management/psychotherapy  Halina Andreas, Medical Student 09/05/2021, 11:36 AM

## 2021-09-05 NOTE — Consult Note (Signed)
Called by [facility] MDR for recommendation on wrist swelling and white discoloration of patients hands/arms. The history presented does not seem c/w gout, but it's reasonable to add uric acid to the w/u. These discolorations are beyond our scope. Recommended that he have dermatology follow up and possible rheum w/u or follow up. Thank you.    Jonnie Finner, DO

## 2021-09-05 NOTE — BHH Counselor (Signed)
Adult Comprehensive Assessment  Patient ID: Brent Rogers, male   DOB: 01/20/1966, 55 y.o.   MRN: [id number]  Information Source: Information source: Patient  Current Stressors:  Patient states their primary concerns and needs for treatment are:: Patient reports that he has been depressed and no motivation.  Patient feels as if he has more medical things going on than behavioral at this time. Patient states their goals for this hospitilization and ongoing recovery are:: Patient feels that behavioral health has done everything they can do, at this time patient feels like he needs more medical attention Educational / Learning stressors: no stressors Employment / Job issues: no stressors Family Relationships: Patient reports that he has a good support system with Aunts, Uncles, brothers and sister in Public librarian / Lack of resources (include bankruptcy): patient on a limited income Housing / Lack of housing: Patient reports that he has his own house, but let his niece stay with him for a little bit of time and they trashed the place. Patient reports that he has little motivation to get the place cleaned up and needs some support with doing this. Physical health (include injuries & life threatening diseases): patient reports that he has pancreatitis.  He also reports that he has had recent swelling that has gone from his legs to now his hands that he would like to get looked at. Social relationships: Patient reports family is his biggest social suppot Substance abuse: none reported Bereavement / Loss: no stressors  Living/Environment/Situation:  Living Arrangements: Alone Living conditions (as described by patient or guardian): patient gets assistance from [employer], feels like he needs support cleaning the place. Patient reports that he doesn't have transportation so it is hard to be able to go to supports. Who else lives in the home?: patient lives alone How long has patient lived in current  situation?: 2 years What is atmosphere in current home: Chaotic  Family History:  Marital status: Single Are you sexually active?: No What is your sexual orientation?: straight Has your sexual activity been affected by drugs, alcohol, medication, or emotional stress?: no Does patient have children?: No  Childhood History:  By whom was/is the patient raised?: Both parents Additional childhood history information: patient reports a good childhood Description of patient's relationship with caregiver when they were a child: "great" Patient's description of current relationship with people who raised him/her: passed away How were you disciplined when you got in trouble as a child/adolescent?: normally Does patient have siblings?: Yes Number of Siblings: 6 Description of patient's current relationship with siblings: 1 sister and 5 brothers, 1 brother and sister have passed away.  Patient reports he has a good relationship with brothers but they have their own lives and he is unable to see them as much as he would like because he doesn't have a car. Did patient suffer any verbal/emotional/physical/sexual abuse as a child?: No Did patient suffer from severe childhood neglect?: No Has patient ever been sexually abused/assaulted/raped as an adolescent or adult?: No Was the patient ever a victim of a crime or a disaster?: No Witnessed domestic violence?: No Has patient been affected by domestic violence as an adult?: No  Education:  Highest grade of school patient has completed: a year in junior college Currently a Consulting civil engineer?: No Learning disability?: No  Employment/Work Situation:   Employment Situation: On disability Why is Patient on Disability: for medical reasons How Long has Patient Been on Disability: 1.5 years Patient's Job has Been Impacted by Current Illness: No  Has Patient ever Been in the [employer]?: No  Financial Resources:   Financial resources: Receives SSI Does patient have  a Lawyer or guardian?: No  Alcohol/Substance Abuse:   What has been your use of drugs/alcohol within the last 12 months?: none reported, occassional beer for social purposes If attempted suicide, did drugs/alcohol play a role in this?: No Alcohol/Substance Abuse Treatment Hx: Denies past history If yes, describe treatment: none Has alcohol/substance abuse ever caused legal problems?: No  Social Support System:   Conservation officer, nature Support System: Good Describe Community Support System: aunts, uncles, brothers and other family Type of faith/religion: Catholic but no longer practices How does patient's faith help to cope with current illness?: n/a  Leisure/Recreation:   Do You Have Hobbies?: Yes Leisure and Hobbies: "Before my surgery, I was playing baseball, every since my surgery...all of that was taken away from me because I didn't have the strenght". "I would go for 2-3 days, then down for the next 4-5 days, so I had to stop".  Strengths/Needs:   What is the patient's perception of their strengths?: good listener, story teller and communicator Patient states they can use these personal strengths during their treatment to contribute to their recovery: yes Patient states these barriers may affect/interfere with their treatment: none Patient states these barriers may affect their return to the community: none Other important information patient would like considered in planning for their treatment: none  Discharge Plan:   Currently receiving community mental health services: No Patient states concerns and preferences for aftercare planning are: none- patient would like medical care Patient states they will know when they are safe and ready for discharge when: patient feels like he needs medical care Does patient have access to transportation?: No Does patient have financial barriers related to discharge medications?: Yes Patient description of barriers related to  discharge medications: none Plan for no access to transportation at discharge: CSW will continue to assess Will patient be returning to same living situation after discharge?: Yes  Summary/Recommendations:   Summary and Recommendations (to be completed by the evaluator): Demilade is a 55 year old male who presented to [facility] for worsening symptoms of depression. Patient reports no prior psychiatric hospitalizations.  He receives disability and is on a limted income. Patient reports that he has pancreatitis.  He presents during assessement with swollen feet and hands.  Patient also reports stomach pain.  Patient reports that he has a good social support system but has no transportation and is unable to see them as often as he would like.  Patient also reports ongoing clutter in his house due to letting his niece to stay there and his niece leaving it in shambles.  Patient is concerned about medical things going on with him at this time.  Patient is not connected with any community mental health providers.  While here, [place] can benefit from crisis stabilization, medication management, therapeutic milieu, and referrals for services.  Elyssia Strausser E Donovan Gatchel. 09/05/2021

## 2021-09-05 NOTE — Progress Notes (Signed)
Pt denies SI/HI/AVH and verbally agrees to approach staff if these become apparent or before harming themselves/others. Rates depression 2/10. Rates anxiety 5/10. Rates pain 7/10. Pt was annoyed this morning stating "I just want to leave. I don't think this place is good for me." Pt has been anxious about his hands and feet swelling. Pt stated that though he wants to leave that he wants to figure out the swelling issue first. Pt has not been attending groups and been remaining in his room for the majority of the day. Pt does go down for meals. Scheduled medications administered to Pt, per MD orders. RN provided support and encouragement to Pt. Q15 min safety checks implemented and continued. Pt safe on the unit. RN will continue to monitor and intervene as needed.   09/05/21 0754  Psych Admission Type (Psych Patients Only)  Admission Status Voluntary  Psychosocial Assessment  Patient Complaints Anxiety;Depression  Eye Contact Brief  Facial Expression Flat;Sad;Other (Comment) (annoyed)  Affect Depressed;Anxious  Speech Logical/coherent  Interaction Assertive  Motor Activity Slow  Appearance/Hygiene Unremarkable  Behavior Characteristics Cooperative;Agitated;Anxious  Mood Depressed;Pleasant;Anxious  Thought Process  Coherency WDL  Content WDL  Delusions None reported or observed  Perception WDL  Hallucination None reported or observed  Judgment Impaired  Confusion None  Danger to Self  Current suicidal ideation? Denies  Self-Injurious Behavior No self-injurious ideation or behavior indicators observed or expressed   Agreement Not to Harm Self Yes  Description of Agreement Verbal contract  Danger to Others  Danger to Others None reported or observed

## 2021-09-05 NOTE — Progress Notes (Signed)
Psychoeducational Group Note  Date:  09/05/2021 Time:  2020  Group Topic/Focus:  Wrap up group  Participation Level: Did Not Attend  Participation Quality:  Not Applicable  Affect:  Not Applicable  Cognitive:  Not Applicable  Insight:  Not Applicable  Engagement in Group: Not Applicable  Additional Comments:  Did not attend.   Selmer, Adduci 09/05/2021, 9:18 PM

## 2021-09-06 ENCOUNTER — Other Ambulatory Visit: Payer: Self-pay

## 2021-09-06 DIAGNOSIS — F332 Major depressive disorder, recurrent severe without psychotic features: Principal | ICD-10-CM

## 2021-09-06 MED ORDER — FAMOTIDINE 20 MG PO TABS
20.0000 mg | ORAL_TABLET | Freq: Two times a day (BID) | ORAL | 0 refills | Status: DC
  Filled 2021-09-06 (×2): qty 60, 30d supply, fill #0

## 2021-09-06 MED ORDER — ONDANSETRON HCL 4 MG PO TABS
4.0000 mg | ORAL_TABLET | Freq: Three times a day (TID) | ORAL | 0 refills | Status: DC | PRN
  Filled 2021-09-06 (×2): qty 90, 30d supply, fill #0

## 2021-09-06 MED ORDER — VENLAFAXINE HCL ER 75 MG PO CP24
75.0000 mg | ORAL_CAPSULE | Freq: Every day | ORAL | 0 refills | Status: DC
  Filled 2021-09-06 (×2): qty 30, 30d supply, fill #0

## 2021-09-06 MED ORDER — MIRTAZAPINE 15 MG PO TABS
15.0000 mg | ORAL_TABLET | Freq: Every day | ORAL | 0 refills | Status: DC
  Filled 2021-09-06: qty 30, 30d supply, fill #0

## 2021-09-06 MED ORDER — SUCRALFATE 1 GM/10ML PO SUSP
1.0000 g | Freq: Three times a day (TID) | ORAL | 0 refills | Status: DC
  Filled 2021-09-06 (×2): qty 1200, 30d supply, fill #0

## 2021-09-06 NOTE — Discharge Summary (Signed)
Physician Discharge Summary Note  Patient:  Brent Rogers is an 55 y.o., male MRN:  [id number] DOB:  15-Jun-1966 Patient phone:  [phone] (home)  Patient address:   [address] [place] [address],  Total Time spent with patient: 1 hour  Date of Admission:  09/03/2021 Date of Discharge: 09/06/2021  Reason for Admission:  [name] is a 55 year old male with a history of MDD and pancreatic insufficiency secondary to a Whipple procedure in 2015 who presents voluntarily under no acute distress for suicidal ideation with plan.   PER H&P   He reports that he has had ongoing somatic symptoms of pancreatic insufficiency over the past two years, including nausea, vomiting, diarrhea, weight loss, decreased appetite, and bowel incontinence that have impaired his lifestyle. He reports that his medical problems and subsequent physical decline have caused his depression to worsen and have suicidal thoughts, as he reports that life is not worth living in his current medically-ill state.    He reports worsening disappointment and sadness about what his life has become and started having suicidal thoughts one week ago. He reports that the thoughts started out as passive because he no longer sees the point of living but since then have become more active. He was thinking about ways to end his life, including the use of a knife or pills. Per chart review, the patient had a knife in his hand and was going to cut himself when he decided to call 911 for help. He also reports anhedonia, decreased energy, some trouble concentrating, decreased appetite, sleep disturbances, and increasing self-isolation. He sometimes takes OTC sleep medications. He reports having suicidal thoughts, that are passive, without intent or plan. Denies HI.    Reports chronically elevated anxiety, that has worsened since admission and currently 7/10. Denies manic / hypomanic episodes at this time or in the past. Denies symptoms of  psychosis at this time or in the past.   Since he has been admitted he reports that his anxiety and depression have worsened because he has no distractions. He has been uninterested in interacting with peers and going to group sessions.   Principal Problem: Major depressive disorder, recurrent episode, severe ([place]) Discharge Diagnoses: Principal Problem:   Major depressive disorder, recurrent episode, severe ([place]) Active Problems:   GAD (generalized anxiety disorder)   Past Psychiatric History: see H&P  Past Medical History:  Past Medical History:  Diagnosis Date   Benign tumor of endocrine pancreas    Chronic pancreatitis ([place])    S/P Whipple   Foot ulcer with fat layer exposed ([place])    Hypertension    Migraine    "@ least once/month" (11/12/2015)   Pancreatic abnormality    CT  shows mass   Pneumonia 11/12/2015   Scoliosis     Past Surgical History:  Procedure Laterality Date   BACK SURGERY     COLONOSCOPY WITH PROPOFOL N/A 03/11/2021   Procedure: COLONOSCOPY WITH PROPOFOL;  Surgeon: Carol Ada, MD;  Location: [place];  Service: Endoscopy;  Laterality: N/A;   ESOPHAGOGASTRODUODENOSCOPY (EGD) WITH PROPOFOL N/A 03/11/2021   Procedure: ESOPHAGOGASTRODUODENOSCOPY (EGD) WITH PROPOFOL;  Surgeon: Carol Ada, MD;  Location: [place];  Service: Endoscopy;  Laterality: N/A;   EUS N/A 09/21/2013   Procedure: ESOPHAGEAL ENDOSCOPIC ULTRASOUND (EUS) RADIAL;  Surgeon: Beryle Beams, MD;  Location: [facility];  Service: Endoscopy;  Laterality: N/A;   EUS N/A 09/30/2013   Procedure: UPPER ENDOSCOPIC ULTRASOUND (EUS) LINEAR;  Surgeon: Beryle Beams, MD;  Location: [facility];  Service: Endoscopy;  Laterality: N/A;   FINE NEEDLE ASPIRATION N/A 09/21/2013   Procedure: FINE NEEDLE ASPIRATION (FNA) LINEAR;  Surgeon: Beryle Beams, MD;  Location: [facility];  Service: Endoscopy;  Laterality: N/A;   FINGER FRACTURE SURGERY Left 1995   5th digit   FRACTURE SURGERY      HEMOSTASIS CLIP PLACEMENT  03/11/2021   Procedure: HEMOSTASIS CLIP PLACEMENT;  Surgeon: Carol Ada, MD;  Location: [facility];  Service: Endoscopy;;   HEMOSTASIS CONTROL  03/11/2021   Procedure: HEMOSTASIS CONTROL;  Surgeon: Carol Ada, MD;  Location: [place];  Service: Endoscopy;;   KNEE ARTHROSCOPY Bilateral 1987-1989   right-left   LAPAROSCOPY N/A 12/01/2013   Procedure: LAPAROSCOPY DIAGNOSTIC PANCREATICODUODENECTOMY WITH BILIARY AND PANCREATIC STENTS;  Surgeon: Stark Klein, MD;  Location: [facility];  Service: General;  Laterality: N/A;   LUMBAR [place] SURGERY  1990's   SAVORY DILATION N/A 03/11/2021   Procedure: SAVORY DILATION;  Surgeon: Carol Ada, MD;  Location: [place];  Service: Endoscopy;  Laterality: N/A;   WHIPPLE PROCEDURE  12/01/2013   Family History:  Family History  Problem Relation Age of Onset   Cancer Mother        unsure   COPD Mother    Hypertension Mother    Hypertension Father    COPD Father    CAD Sister        possible has stent per pt   Family Psychiatric  History: see H&P Social History:  Social History   Substance and Sexual Activity  Alcohol Use Yes   Alcohol/week: 2.0 standard drinks   Types: 2 Cans of beer per week   Comment: monthly     Social History   Substance and Sexual Activity  Drug Use No    Social History   Socioeconomic History   Marital status: Divorced    Spouse name: Not on file   Number of children: Not on file   Years of education: Not on file   Highest education level: Not on file  Occupational History   Not on file  Tobacco Use   Smoking status: Every Day    Packs/day: 1.00    Types: Cigarettes   Smokeless tobacco: Former    Types: Snuff   Tobacco comments:    "used snuff 10-15 years in my 20s-30s"  Vaping Use   Vaping Use: Never used  Substance and Sexual Activity   Alcohol use: Yes    Alcohol/week: 2.0 standard drinks    Types: 2 Cans of beer per week    Comment: monthly   Drug use: No    Sexual activity: Not Currently    Partners: Male  Other Topics Concern   Not on file  Social History Narrative   Not on file   Social Determinants of Health   Financial Resource Strain: Not on file  Food Insecurity: Not on file  Transportation Needs: Not on file  Physical Activity: Not on file  Stress: Not on file  Social Connections: Not on file    Hospital Course:   [place]   Patient was admitted via emergency department because of worsening of depression and suicidal thoughts.    After admission, patient was started on Effexor and Remeron due to significant depression and anxiety.  Patient's Effexor was titrated up to 75 mg and Remeron was continued at 50 mg.  Patient reported improvement in mood and anxiety symptoms after a few days.  Patient denied suicidal thought after the first  day of admission.  Gradually, patient started adjusting to milieu.   Case was discussed in multi disciplinary team meeting.   During later part of hospitalization, mood and affect improved.   Patient denied any hallucinations or delusions and prior to discharge patient repeatedly denied any thoughts intentions or plans of harming her hurting himself or anyone else, that was carefully explored with the patient.     Case was also discussed in multi disciplinary team meeting    Importance of adherence with the treatment and relapse prevention were discussed with the patient.      For detailed history, mental status examination at time of admission, diagnoses and treatment plan, please see the dictated psychiatric evaluation at the time of admission After admission, patient was seen in individual supportive psychotherapy, was encouraged to participate in unit milieu.  Case was also discussed with the staff.   During the later part of hospitalization mood and affect started improving.  Patient was feeling more hopeful.  No side effects from medication reported.  Physical  Findings:  Musculoskeletal: Strength & Muscle Tone: within normal limits Gait & Station: normal Patient leans: N/A   Psychiatric Specialty Exam:  Presentation  General Appearance: Appropriate for Environment; Casual; Fairly Groomed  Eye Contact:Good  Speech:Normal Rate  Speech Volume:Decreased  Handedness:No data recorded  Mood and Affect  Mood:Euthymic  Affect:Appropriate; Congruent; Full Range   Thought Process  Thought Processes:Linear  Descriptions of Associations:Intact  Orientation:Full (Time, Place and Person)  Thought Content:Logical  History of Schizophrenia/Schizoaffective disorder:No  Duration of Psychotic Symptoms:N/A  Hallucinations:Hallucinations: None  Ideas of Reference:None  Suicidal Thoughts:Suicidal Thoughts: No  Homicidal Thoughts:Homicidal Thoughts: No   Sensorium  Memory:Immediate Good; Recent Good; Remote Good  Judgment:Good  Insight:Good   Executive Functions  Concentration:Good  Attention Span:Good  Recall:No data recorded Fund of Knowledge:Good  Language:Good   Psychomotor Activity  Psychomotor Activity:Psychomotor Activity: Normal   Assets  Assets:Communication Skills; Desire for Improvement; Housing; Social Support; Transportation   Sleep  Sleep:Sleep: Good    Physical Exam: Physical Exam Vitals and nursing note reviewed.  Constitutional:      Appearance: Normal appearance. He is normal weight.  HENT:     Head: Normocephalic and atraumatic.  Pulmonary:     Effort: Pulmonary effort is normal.  Neurological:     General: No focal deficit present.     Mental Status: He is oriented to person, place, and time.   Review of Systems  Respiratory:  Negative for shortness of breath.   Cardiovascular:  Negative for chest pain.  Gastrointestinal:  Negative for abdominal pain, constipation, diarrhea, heartburn, nausea and vomiting.  Neurological:  Negative for headaches.  Blood pressure (!) 125/94, pulse  84, temperature 97.9 F (36.6 C), resp. rate 18, height 6\' 3"  (1.905 m), weight 57.6 kg, SpO2 100 %. Body mass index is 15.87 kg/m.   Social History   Tobacco Use  Smoking Status Every Day   Packs/day: 1.00   Types: Cigarettes  Smokeless Tobacco Former   Types: Snuff  Tobacco Comments   "used snuff 10-15 years in my 20s-30s"   Tobacco Cessation:  N/A, patient does not currently use tobacco products   Blood Alcohol level:  Lab Results  Component Value Date   ETH <10 09/02/2021   ETH 15 (H) 56/38/7564    Metabolic Disorder Labs:  Lab Results  Component Value Date   HGBA1C 5.3 09/04/2021   MPG 105.41 09/04/2021   MPG 103 03/06/2021   No results found for: PROLACTIN  Lab Results  Component Value Date   CHOL 125 09/04/2021   TRIG 46 09/04/2021   HDL 64 09/04/2021   CHOLHDL 2.0 09/04/2021   VLDL 9 09/04/2021   LDLCALC 52 09/04/2021   LDLCALC 48 03/06/2021    See Psychiatric Specialty Exam and Suicide Risk Assessment completed by Attending Physician prior to discharge.  Discharge destination:  Home  Is patient on multiple antipsychotic therapies at discharge:  No   Has Patient had three or more failed trials of antipsychotic monotherapy by history:  No  Recommended Plan for Multiple Antipsychotic Therapies: NA   Allergies as of 09/06/2021   No Known Allergies      Medication List     STOP taking these medications    acetaminophen 500 MG tablet Commonly known as: TYLENOL   DULoxetine 20 MG capsule Commonly known as: Cymbalta       TAKE these medications      Indication  amLODipine 5 MG tablet Commonly known as: NORVASC Take 1 tablet (5 mg total) by mouth daily.  Indication: High Blood Pressure Disorder   bismuth subsalicylate 951 OA/41YS suspension Commonly known as: PEPTO BISMOL Take 30 mLs by mouth every 6 (six) hours as needed for indigestion.  Indication: Diarrhea, Stomach Upset   Creon 24000-76000 units Cpep Generic drug:  Pancrelipase (Lip-Prot-Amyl) Take 1 capsule (24,000 Units total) by mouth with breakfast, with lunch, and with evening meal.  Indication: Pancreatic Insufficiency   famotidine 20 MG tablet Commonly known as: PEPCID Take 1 tablet (20 mg total) by mouth 2 (two) times daily.  Indication: Heartburn   FeroSul 325 (65 FE) MG tablet Generic drug: ferrous sulfate Take 1 tablet (325 mg total) by mouth daily. What changed: when to take this  Indication: Anemia From Inadequate Iron in the Body   mirtazapine 15 MG tablet Commonly known as: REMERON Take 1 tablet (15 mg total) by mouth at bedtime.  Indication: Major Depressive Disorder   ondansetron 4 MG tablet Commonly known as: ZOFRAN Take 1 tablet (4 mg total) by mouth every 8 (eight) hours as needed for nausea or vomiting. What changed:  when to take this reasons to take this  Indication: Nausea and Vomiting   pantoprazole 40 MG tablet Commonly known as: PROTONIX Take 1 tablet (40 mg total) by mouth daily.  Indication: Gastroesophageal Reflux Disease   promethazine 25 MG tablet Commonly known as: PHENERGAN Take 1 tablet (25 mg total) by mouth every 6 (six) hours as needed for nausea or vomiting.  Indication: Nausea and Vomiting   sucralfate 1 GM/10ML suspension Commonly known as: Carafate Take 10 mLs (1 g total) by mouth 4 (four) times daily -  with meals and at bedtime.  Indication: Gastroesophageal Reflux Disease   venlafaxine XR 75 MG 24 hr capsule Commonly known as: EFFEXOR-XR Take 1 capsule (75 mg total) by mouth daily with breakfast.  Indication: Major Depressive Disorder        Follow-up Information     [place]. Go to.   Specialty: Behavioral Health Why: Please go to this provider for therapy and medication management services during walk in hours:  Monday through Wednesday, from 7:45 am to 11:00 am.  Services are provided on a first come, first served basis.  Please arrive  early. Contact information: [place] [phone]                 Follow-up recommendations:   Activity:  as tolerated Diet:  heart healthy   Comments:  Prescriptions were given at discharge.  Patient is agreeable with the discharge plan.  Patient was given an opportunity to ask questions.  Patient appears to feel comfortable with discharge and denies any current suicidal or homicidal thoughts.    Patient is instructed prior to discharge to: Take all medications as prescribed by mental healthcare provider. Report any adverse effects and or reactions from the medicines to outpatient provider promptly. In the event of worsening symptoms, patient is instructed to call the crisis hotline, 911 and or go to the nearest ED for appropriate evaluation and treatment of symptoms. Patient is to follow-up with primary care provider for other medical issues, concerns and or health care needs.   Signed: France Ravens, MD 09/06/2021, 12:12 PM

## 2021-09-06 NOTE — Progress Notes (Signed)
  [facility] Adult Case Management Discharge Plan :  Will you be returning to the same living situation after discharge:  Yes,  back to house At discharge, do you have transportation home?: Yes,  support will pick up Do you have the ability to pay for your medications: Yes,  insurance  Release of information consent forms completed and in the chart;  Patient's signature needed at discharge.  Patient to Follow up at:  [facility]. Go to.   Specialty: Behavioral Health Why: Please go to this provider for therapy and medication management services during walk in hours:  Monday through Wednesday, from 7:45 am to 11:00 am.  Services are provided on a first come, first served basis.  Please arrive early. Contact information: [employer] [phone]                Next level of care provider has access to [place] and Suicide Prevention discussed: Yes,  with patient, refused consents with family members or supports     Has patient been referred to the Quitline?: Patient refused referral  Patient has been referred for addiction treatment: [place], LCSW 09/06/2021, 10:17 AM

## 2021-09-06 NOTE — BHH Suicide Risk Assessment (Signed)
[facility] Discharge Suicide Risk Assessment   Principal Problem: Major depressive disorder, recurrent episode, severe ([place]) Discharge Diagnoses: Principal Problem:   Major depressive disorder, recurrent episode, severe ([place]) Active Problems:   GAD (generalized anxiety disorder)   Total Time spent with patient: 20 minutes  Brent Rogers is a 55 year old male with a history of MDD and pancreatic insufficiency secondary to a Whipple procedure in 2015 who presents voluntarily for worsening depression and suicidal ideation with plan.  During the patient's hospitalization, patient had extensive initial psychiatric evaluation, and follow-up psychiatric evaluations every day.  Psychiatric diagnoses provided upon initial assessment:  MDD recurrent, severe  GAD  Pt also has medical diagnoses:  Pancreatic insufficiency secondary to whipple procedure GERD HTN  Patient's psychiatric medications were adjusted on admission:  - Start Effexor 37.5 mg once daily, increase to 75 mg once daily on 12-8 - Start Remeron 15 mg qhs - Start Hydroxyzine PRN for anxiety - Start Trazodone PRN for sleep   During the hospitalization, other adjustments were made to the patient's psychiatric medication regimen:  Effexor was increased to 75 mg once daily Mirtazapine was increased to 30 mg qhs   Gradually, patient started adjusting to milieu.   Patient's care was discussed during the interdisciplinary team meeting every day during the hospitalization.  The patient denied  having side effects to prescribed psychiatric medication.  The patient reports their target psychiatric symptoms of depression, anxiety, and suicidal thoughts, all responded well to the psychiatric medications, and the patient reports overall benefit other psychiatric hospitalization. Supportive psychotherapy was provided to the patient. The patient also participated in regular group therapy while admitted.   Labs were reviewed with the patient, and  abnormal results were discussed with the patient.  The patient denied having suicidal thoughts more than 48 hours prior to discharge.  Patient denies having homicidal thoughts.  Patient denies having auditory hallucinations.  Patient denies any visual hallucinations.  Patient denies having paranoid thoughts.  The patient is able to verbalize their individual safety plan to this provider.  It is recommended to the patient to continue psychiatric medications as prescribed, after discharge from the hospital.    It is recommended to the patient to follow up with your outpatient psychiatric provider and PCP.  Discussed with the patient, the impact of alcohol, drugs, tobacco have been there overall psychiatric and medical wellbeing, and total abstinence from substance use was recommended the patient.     Musculoskeletal: Strength & Muscle Tone: weakness Gait & Station: normal Patient leans: N/A  Psychiatric Specialty Exam  Presentation  General Appearance: Appropriate for Environment; Casual; Fairly Groomed  Eye Contact:Good  Speech:Normal Rate  Speech Volume:Decreased  Handedness:No data recorded  Mood and Affect  Mood:Euthymic  Duration of Depression Symptoms: Greater than two weeks  Affect:Appropriate; Congruent; Full Range   Thought Process  Thought Processes:Linear  Descriptions of Associations:Intact  Orientation:Full (Time, Place and Person)  Thought Content:Logical  History of Schizophrenia/Schizoaffective disorder:No  Duration of Psychotic Symptoms:N/A  Hallucinations:Hallucinations: None  Ideas of Reference:None  Suicidal Thoughts:Suicidal Thoughts: No  Homicidal Thoughts:Homicidal Thoughts: No   Sensorium  Memory:Immediate Good; Recent Good; Remote Good  Judgment:Good  Insight:Good   Executive Functions  Concentration:Good  Attention Span:Good  Recall:No data recorded Fund of Knowledge:Good  Language:Good   Psychomotor Activity   Psychomotor Activity:Psychomotor Activity: Normal   Assets  Assets:Communication Skills; Desire for Improvement; Housing; Social Support; Transportation   Sleep  Sleep:Sleep: Good   Physical Exam: Physical Exam Vitals reviewed.  Constitutional:  General: He is not in acute distress.    Appearance: He is not toxic-appearing.  Pulmonary:     Effort: Pulmonary effort is normal.  Musculoskeletal:        General: Swelling present.  Neurological:     Mental Status: He is alert.  Psychiatric:        Mood and Affect: Mood normal.        Behavior: Behavior normal.        Thought Content: Thought content normal.        Judgment: Judgment normal.   Review of Systems  Constitutional:  Negative for chills and fever.  Cardiovascular:  Negative for chest pain and palpitations.  Psychiatric/Behavioral:  Negative for depression, hallucinations, memory loss, substance abuse and suicidal ideas. The patient is not nervous/anxious and does not have insomnia.   All other systems reviewed and are negative. Blood pressure (!) 125/94, pulse 84, temperature 97.9 F (36.6 C), resp. rate 18, height 6\' 3"  (1.905 m), weight 57.6 kg, SpO2 100 %. Body mass index is 15.87 kg/m.  Mental Status Per Nursing Assessment::   On Admission:  Suicidal ideation indicated by patient  Demographic Factors:  Male, Low socioeconomic status, and Living alone  Loss Factors: Decline in physical health and Financial problems/change in socioeconomic status  Historical Factors: NA  Risk Reduction Factors:   Positive social support, Positive therapeutic relationship, and Positive coping skills or problem solving skills  Continued Clinical Symptoms:  Depression much improved. Mood is better and stable. Sleep improved. Denies SI. Anxiety - improved.   Cognitive Features That Contribute To Risk:  None    Suicide Risk:  Mild:  There are no identifiable suicide plans, no associated intent, mild dysphoria and  related symptoms, good self-control (both objective and subjective assessment), few other risk factors, and identifiable protective factors, including available and accessible social support.   [place]. Go to.   Specialty: Behavioral Health Why: Please go to this provider for therapy and medication management services during walk in hours:  Monday through Wednesday, from 7:45 am to 11:00 am.  Services are provided on a first come, first served basis.  Please arrive early. Contact information: [place] [phone]                Plan Of Care/Follow-up recommendations:   Activity: as tolerated  Diet: heart healthy  Other: -Follow-up with your outpatient psychiatric provider -instructions on appointment date, time, and address (location) are provided to you in discharge paperwork.  -Take your psychiatric medications as prescribed at discharge - instructions are provided to you in the discharge paperwork  -Follow-up with outpatient primary care doctor and other specialists -for management of chronic medical disease, including: swelling, chronic pancreatitis. As we discussed, it is recommended to f/u with outpatient PCP about swelling very soon after discharge (same day recommended).  -Testing: Follow-up with outpatient provider for abnormal lab results:  09/05/2021: Calcium 8, total protein 5.1, albumin 2.3, alkaline phosphatase 144, total bilirubin 0.2   -Recommend abstinence from alcohol, tobacco, and other illicit drug use at discharge.   -If your psychiatric symptoms recur, worsening, or if you have side effects to your psychiatric medications, call your outpatient psychiatric provider, 911, [phone] or go to the nearest emergency department.  -If suicidal thoughts recur, call your outpatient psychiatric provider, 911, [phone] or go to the nearest emergency department.   Christoper Allegra, MD 09/06/2021, 10:11 AM

## 2021-09-06 NOTE — Group Note (Signed)
Recreation Therapy Group Note   Group Topic:Stress Management  Group Date: 09/06/2021 Start Time: 0930 End Time: 0949 Facilitators: Victorino Sparrow, [place] Location: [address]   Goal Area(s) Addresses:  Patient will identify positive stress management techniques. Patient will identify benefits of using stress management post d/c.  Group Description:  Meditation.  LRT played a meditation that focused on setting boundaries with the people around you.  Meditation emphasized the point of saying no to things and the wishes of others is ok in order to put your feelings and desires first.     Affect/Mood: N/A   Participation Level: Did not attend    Clinical Observations/Individualized Feedback:     Plan: Continue to engage patient in RT group sessions 2-3x/week.   Gionni Vaca, LRT,CTRS 09/06/2021 10:42 AM

## 2021-09-06 NOTE — Progress Notes (Signed)
RN met with pt and reviewed pt's discharge instructions.  Pt verbalized understanding of discharge instructions and pt did not have any questions. RN reviewed and provided pt with a copy of SRA, AVS and Transition Record.  RN returned pt's belongings to pt.  Pt denied SI/HI/AVH and voiced no concerns. Pt received compression socks. Pt was appreciative of the care pt received at [facility].  Patient discharged to the lobby without incident.   09/06/21 0816  Psych Admission Type (Psych Patients Only)  Admission Status Voluntary  Psychosocial Assessment  Patient Complaints None  Eye Contact Fair  Facial Expression Sad  Affect Appropriate to circumstance  Speech Logical/coherent  Interaction Assertive  Motor Activity Slow  Appearance/Hygiene Unremarkable  Behavior Characteristics Cooperative;Calm  Mood Pleasant  Thought Process  Coherency WDL  Content WDL  Delusions None reported or observed  Perception WDL  Hallucination None reported or observed  Judgment WDL  Confusion None  Danger to Self  Current suicidal ideation? Denies  Self-Injurious Behavior No self-injurious ideation or behavior indicators observed or expressed   Agreement Not to Harm Self Yes  Description of Agreement Verbal contract  Danger to Others  Danger to Others None reported or observed

## 2021-09-06 NOTE — Progress Notes (Signed)
   09/05/21 2153  Psych Admission Type (Psych Patients Only)  Admission Status Voluntary  Psychosocial Assessment  Patient Complaints Anxiety;Depression  Eye Contact Brief  Facial Expression Flat;Sad;Other (Comment) (annoyed)  Affect Depressed;Anxious  Speech Logical/coherent  Interaction Assertive  Motor Activity Slow  Appearance/Hygiene Unremarkable  Behavior Characteristics Cooperative;Calm  Mood Pleasant  Thought Process  Coherency WDL  Content WDL  Delusions None reported or observed  Perception WDL  Hallucination None reported or observed  Judgment Impaired  Confusion None  Danger to Self  Current suicidal ideation? Denies  Self-Injurious Behavior No self-injurious ideation or behavior indicators observed or expressed   Agreement Not to Harm Self Yes  Description of Agreement Verbal contract  Danger to Others  Danger to Others None reported or observed

## 2021-09-06 NOTE — [facility] Suicide Risk Assessment (Signed)
[place] INPATIENT:  Family/Significant Other Suicide Prevention Education  Suicide Prevention Education:  Patient Refusal for Family/Significant Other Suicide Prevention Education: The patient Brent Rogers has refused to provide written consent for family/significant other to be provided Family/Significant Other Suicide Prevention Education during admission and/or prior to discharge.  Physician notified.  Tymeshia Awan E Karver Fadden 09/06/2021, 9:34 AM

## 2021-09-09 ENCOUNTER — Other Ambulatory Visit: Payer: Self-pay

## 2021-09-09 ENCOUNTER — Encounter (HOSPITAL_COMMUNITY): Payer: Self-pay

## 2021-09-09 ENCOUNTER — Emergency Department (HOSPITAL_COMMUNITY): Payer: Medicaid Other

## 2021-09-09 ENCOUNTER — Emergency Department (HOSPITAL_COMMUNITY)
Admission: EM | Admit: 2021-09-09 | Discharge: 2021-09-09 | Disposition: A | Discharge status: Home / Self Care | Payer: Medicaid Other | Attending: Emergency Medicine | Admitting: Emergency Medicine

## 2021-09-09 DIAGNOSIS — R072 Precordial pain: Secondary | ICD-10-CM | POA: Diagnosis present

## 2021-09-09 DIAGNOSIS — R109 Unspecified abdominal pain: Secondary | ICD-10-CM | POA: Insufficient documentation

## 2021-09-09 DIAGNOSIS — G8929 Other chronic pain: Secondary | ICD-10-CM | POA: Diagnosis not present

## 2021-09-09 DIAGNOSIS — K219 Gastro-esophageal reflux disease without esophagitis: Secondary | ICD-10-CM | POA: Diagnosis not present

## 2021-09-09 DIAGNOSIS — R112 Nausea with vomiting, unspecified: Secondary | ICD-10-CM | POA: Insufficient documentation

## 2021-09-09 DIAGNOSIS — I10 Essential (primary) hypertension: Secondary | ICD-10-CM | POA: Diagnosis not present

## 2021-09-09 DIAGNOSIS — Z79899 Other long term (current) drug therapy: Secondary | ICD-10-CM | POA: Diagnosis not present

## 2021-09-09 DIAGNOSIS — R2243 Localized swelling, mass and lump, lower limb, bilateral: Secondary | ICD-10-CM | POA: Insufficient documentation

## 2021-09-09 DIAGNOSIS — F17210 Nicotine dependence, cigarettes, uncomplicated: Secondary | ICD-10-CM | POA: Insufficient documentation

## 2021-09-09 DIAGNOSIS — R600 Localized edema: Secondary | ICD-10-CM

## 2021-09-09 LAB — CBC WITH DIFFERENTIAL/PLATELET
Abs Immature Granulocytes: 0.2 10*3/uL — ABNORMAL HIGH (ref 0.00–0.07)
Basophils Absolute: 0.1 10*3/uL (ref 0.0–0.1)
Basophils Relative: 1 %
Eosinophils Absolute: 0.2 10*3/uL (ref 0.0–0.5)
Eosinophils Relative: 2 %
HCT: 35 % — ABNORMAL LOW (ref 39.0–52.0)
Hemoglobin: 11.6 g/dL — ABNORMAL LOW (ref 13.0–17.0)
Immature Granulocytes: 2 %
Lymphocytes Relative: 30 %
Lymphs Abs: 3.4 10*3/uL (ref 0.7–4.0)
MCH: 30.6 pg (ref 26.0–34.0)
MCHC: 33.1 g/dL (ref 30.0–36.0)
MCV: 92.3 fL (ref 80.0–100.0)
Monocytes Absolute: 1.3 10*3/uL — ABNORMAL HIGH (ref 0.1–1.0)
Monocytes Relative: 12 %
Neutro Abs: 6.2 10*3/uL (ref 1.7–7.7)
Neutrophils Relative %: 53 %
Platelets: 280 10*3/uL (ref 150–400)
RBC: 3.79 MIL/uL — ABNORMAL LOW (ref 4.22–5.81)
RDW: 14.6 % (ref 11.5–15.5)
WBC: 11.4 10*3/uL — ABNORMAL HIGH (ref 4.0–10.5)
nRBC: 0 % (ref 0.0–0.2)

## 2021-09-09 LAB — COMPREHENSIVE METABOLIC PANEL
ALT: 45 U/L — ABNORMAL HIGH (ref 0–44)
AST: 51 U/L — ABNORMAL HIGH (ref 15–41)
Albumin: 2.8 g/dL — ABNORMAL LOW (ref 3.5–5.0)
Alkaline Phosphatase: 163 U/L — ABNORMAL HIGH (ref 38–126)
Anion gap: 7 (ref 5–15)
BUN: 5 mg/dL — ABNORMAL LOW (ref 6–20)
CO2: 27 mmol/L (ref 22–32)
Calcium: 8.3 mg/dL — ABNORMAL LOW (ref 8.9–10.3)
Chloride: 105 mmol/L (ref 98–111)
Creatinine, Ser: 0.67 mg/dL (ref 0.61–1.24)
GFR, Estimated: >60 mL/min (ref >60)
Glucose, Bld: 97 mg/dL (ref 70–99)
Potassium: 3.4 mmol/L — ABNORMAL LOW (ref 3.5–5.1)
Sodium: 139 mmol/L (ref 135–145)
Total Bilirubin: 0.3 mg/dL (ref 0.3–1.2)
Total Protein: 6 g/dL — ABNORMAL LOW (ref 6.5–8.1)

## 2021-09-09 MED ORDER — ONDANSETRON 8 MG PO TBDP
8.0000 mg | ORAL_TABLET | Freq: Three times a day (TID) | ORAL | 0 refills | Status: DC | PRN
  Filled 2021-09-09: qty 8, 3d supply, fill #0

## 2021-09-09 MED ORDER — PROMETHAZINE HCL 25 MG PO TABS
25.0000 mg | ORAL_TABLET | Freq: Four times a day (QID) | ORAL | 0 refills | Status: DC | PRN
  Filled 2021-09-09 – 2021-09-10 (×3): qty 30, 8d supply, fill #0

## 2021-09-09 NOTE — [facility] Group Notes (Signed)
Patient did not attend group.    Spiritual care group on grief and loss facilitated by chaplain Janne Napoleon, [facility]   Group Goal:   Support / Education around grief and loss   Members engage in facilitated group support and psycho-social education.   Group Description:   Following introductions and group rules, group members engaged in facilitated group dialog and support around topic of loss, with particular support around experiences of loss in their lives. Group Identified types of loss (relationships / self / things) and identified patterns, circumstances, and changes that precipitate losses. Reflected on thoughts / feelings around loss, normalized grief responses, and recognized variety in grief experience. Group noted Worden's four tasks of grief in discussion.   Group drew on Adlerian / Rogerian, narrative, MI,     [place], [place] Pager, [phone]

## 2021-09-09 NOTE — ED Triage Notes (Signed)
Pt BIB EMS with complaints of chest wall pain and bilateral feet swelling x 2 days.

## 2021-09-09 NOTE — ED Provider Notes (Signed)
[facility] DEPT Provider Note   CSN: [id number] Arrival date & time: 09/09/21  0116     History Chief Complaint  Patient presents with   Chest Pain   Foot Swelling    Brent Rogers is a 54 y.o. male.  The history is provided by the patient.     Patient with history of chronic pancreatitis s/p Whipple, hypertension, frequent ED visits presents with multiple complaints.  Patient reports increasing swelling in both of his legs for the past 2 days.  He also reports chest wall pain.  He also reports ongoing abdominal pain and vomiting he has had previously.  His course is worsening.  Nothing improves his symptoms. Past Medical History:  Diagnosis Date   Benign tumor of endocrine pancreas    Chronic pancreatitis ([place])    S/P Whipple   Foot ulcer with fat layer exposed ([place])    Hypertension    Migraine    "@ least once/month" (11/12/2015)   Pancreatic abnormality    CT  shows mass   Pneumonia 11/12/2015   Scoliosis     Patient Active Problem List   Diagnosis Date Noted   Major depressive disorder, recurrent episode, severe ([place]) 09/04/2021   GAD (generalized anxiety disorder) 09/04/2021   Major depressive disorder, recurrent episode, moderate ([place]) 03/24/2021   Open wound of pharynx without complication 37/06/6268   Atherosclerosis of native coronary artery of native heart without angina pectoris    Blood in stool    Iron deficiency anemia    Syncope 03/06/2021   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   Chronic pain syndrome 06/07/2019   Depression 06/07/2019   Chronic pancreatitis ([place]) 05/01/2017   Foot callus 12/25/2015   Onychomycosis of toenail 12/25/2015   Fatty liver 11/12/2015   GERD (gastroesophageal reflux disease) 11/12/2015   HTN (hypertension) 07/10/2015   S/P cholecystectomy 07/10/2015   Tobacco abuse 12/26/2014   Protein-calorie malnutrition, severe ([place]) 11/20/2014   Exocrine pancreatic insufficiency ([place])  12/23/2013    Past Surgical History:  Procedure Laterality Date   BACK SURGERY     COLONOSCOPY WITH PROPOFOL N/A 03/11/2021   Procedure: COLONOSCOPY WITH PROPOFOL;  Surgeon: Carol Ada, MD;  Location: [place];  Service: Endoscopy;  Laterality: N/A;   ESOPHAGOGASTRODUODENOSCOPY (EGD) WITH PROPOFOL N/A 03/11/2021   Procedure: ESOPHAGOGASTRODUODENOSCOPY (EGD) WITH PROPOFOL;  Surgeon: Carol Ada, MD;  Location: [place];  Service: Endoscopy;  Laterality: N/A;   EUS N/A 09/21/2013   Procedure: ESOPHAGEAL ENDOSCOPIC ULTRASOUND (EUS) RADIAL;  Surgeon: Beryle Beams, MD;  Location: [facility];  Service: Endoscopy;  Laterality: N/A;   EUS N/A 09/30/2013   Procedure: UPPER ENDOSCOPIC ULTRASOUND (EUS) LINEAR;  Surgeon: Beryle Beams, MD;  Location: [facility];  Service: Endoscopy;  Laterality: N/A;   FINE NEEDLE ASPIRATION N/A 09/21/2013   Procedure: FINE NEEDLE ASPIRATION (FNA) LINEAR;  Surgeon: Beryle Beams, MD;  Location: [facility];  Service: Endoscopy;  Laterality: N/A;   FINGER FRACTURE SURGERY Left 1995   5th digit   FRACTURE SURGERY     HEMOSTASIS CLIP PLACEMENT  03/11/2021   Procedure: HEMOSTASIS CLIP PLACEMENT;  Surgeon: Carol Ada, MD;  Location: [place];  Service: Endoscopy;;   HEMOSTASIS CONTROL  03/11/2021   Procedure: HEMOSTASIS CONTROL;  Surgeon: Carol Ada, MD;  Location: [place];  Service: Endoscopy;;   KNEE ARTHROSCOPY Bilateral 1987-1989   right-left   LAPAROSCOPY N/A 12/01/2013   Procedure: LAPAROSCOPY DIAGNOSTIC PANCREATICODUODENECTOMY WITH BILIARY AND PANCREATIC STENTS;  Surgeon: Stark Klein, MD;  Location: [facility];  Service: General;  Laterality: N/A;   LUMBAR DISC SURGERY  1990's   SAVORY DILATION N/A 03/11/2021   Procedure: SAVORY DILATION;  Surgeon: Carol Ada, MD;  Location: [place];  Service: Endoscopy;  Laterality: N/A;   WHIPPLE PROCEDURE  12/01/2013       Family History  Problem Relation Age of Onset   Cancer Mother         unsure   COPD Mother    Hypertension Mother    Hypertension Father    COPD Father    CAD Sister        possible has stent per pt    Social History   Tobacco Use   Smoking status: Every Day    Packs/day: 1.00    Types: Cigarettes   Smokeless tobacco: Former    Types: Snuff   Tobacco comments:    "used snuff 10-15 years in my 20s-30s"  Vaping Use   Vaping Use: Never used  Substance Use Topics   Alcohol use: Yes    Alcohol/week: 2.0 standard drinks    Types: 2 Cans of beer per week    Comment: monthly   Drug use: No    Home Medications Prior to Admission medications   Medication Sig Start Date End Date Taking? Authorizing Provider  amLODipine (NORVASC) 5 MG tablet Take 1 tablet (5 mg total) by mouth daily. 03/21/21   Ladell Pier, MD  bismuth subsalicylate (PEPTO BISMOL) 262 MG/15ML suspension Take 30 mLs by mouth every 6 (six) hours as needed for indigestion.    [provider]  famotidine (PEPCID) 20 MG tablet Take 1 tablet (20 mg total) by mouth 2 (two) times daily. 09/06/21 10/06/21  France Ravens, MD  ferrous sulfate 325 (65 FE) MG tablet Take 1 tablet (325 mg total) by mouth daily. Patient taking differently: Take 325 mg by mouth every other day. 03/16/21   Ladell Pier, MD  mirtazapine (REMERON) 15 MG tablet Take 1 tablet (15 mg total) by mouth at bedtime. 09/06/21 10/06/21  France Ravens, MD  ondansetron (ZOFRAN) 4 MG tablet Take 1 tablet (4 mg total) by mouth every 8 (eight) hours as needed for nausea or vomiting. 09/06/21 10/06/21  France Ravens, MD  Pancrelipase, Lip-Prot-Amyl, (CREON) 24000-76000 units CPEP Take 1 capsule (24,000 Units total) by mouth with breakfast, with lunch, and with evening meal. Patient not taking: Reported on 09/03/2021 03/16/21   Ladell Pier, MD  pantoprazole (PROTONIX) 40 MG tablet Take 1 tablet (40 mg total) by mouth daily. Patient not taking: Reported on 09/03/2021 03/16/21   Ladell Pier, MD  promethazine (PHENERGAN) 25 MG  tablet Take 1 tablet (25 mg total) by mouth every 6 (six) hours as needed for nausea or vomiting. Patient not taking: Reported on 09/03/2021 07/05/21   Daleen Bo, MD  sucralfate (CARAFATE) 1 GM/10ML suspension Take 10 mLs (1 g total) by mouth 4 (four) times daily -  with meals and at bedtime. 09/06/21 10/06/21  France Ravens, MD  venlafaxine XR (EFFEXOR-XR) 75 MG 24 hr capsule Take 1 capsule (75 mg total) by mouth daily with breakfast. 09/06/21 10/06/21  France Ravens, MD    Allergies    Patient has no known allergies.  Review of Systems   Review of Systems  Constitutional:  Negative for fever.  Respiratory:  Positive for shortness of breath.   Cardiovascular:  Positive for leg swelling.       Chest wall pain  Gastrointestinal:  Positive for  abdominal pain and vomiting.  All other systems reviewed and are negative.  Physical Exam Updated Vital Signs BP 102/76   Pulse 67   Temp 97.8 F (36.6 C) (Oral)   Resp 18   Ht 1.905 m (6\' 3" )   Wt 59 kg   SpO2 100%   BMI 16.25 kg/m   Physical Exam CONSTITUTIONAL: Chronically ill, cachectic HEAD: Normocephalic/atraumatic EYES: EOMI NECK: supple no meningeal signs SPINE/BACK:entire spine nontender CV: S1/S2 noted, no murmurs/rubs/gallops noted LUNGS: Lungs are clear to auscultation bilaterally, no apparent distress ABDOMEN: soft, mild diffuse tenderness, no rebound or guarding, bowel sounds noted throughout abdomen, well-healed surgical scars NEURO: Pt is awake/alert/appropriate, moves all extremitiesx4.  No facial droop.   EXTREMITIES: pulses normal/equal, full ROM, symmetric edema to bilateral lower extremities.  No signs of trauma.  Distal pulses intact SKIN: warm, color normal PSYCH: no abnormalities of mood noted, alert and oriented to situation  ED Results / Procedures / Treatments   Labs (all labs ordered are listed, but only abnormal results are displayed) Labs Reviewed  CBC WITH DIFFERENTIAL/PLATELET - Abnormal; Notable for the  following components:      Result Value   WBC 11.4 (*)    RBC 3.79 (*)    Hemoglobin 11.6 (*)    HCT 35.0 (*)    Monocytes Absolute 1.3 (*)    Abs Immature Granulocytes 0.20 (*)    All other components within normal limits  COMPREHENSIVE METABOLIC PANEL - Abnormal; Notable for the following components:   Potassium 3.4 (*)    BUN 5 (*)    Calcium 8.3 (*)    Total Protein 6.0 (*)    Albumin 2.8 (*)    AST 51 (*)    ALT 45 (*)    Alkaline Phosphatase 163 (*)    All other components within normal limits    EKG EKG Interpretation  Date/Time:  Monday September 09 2021 01:40:01 EST Ventricular Rate:  68 PR Interval:  172 QRS Duration: 77 QT Interval:  399 QTC Calculation: 425 R Axis:   54 Text Interpretation: Sinus rhythm Interpretation limited secondary to artifact Confirmed by Ripley Fraise ([id number]) on 09/09/2021 2:01:34 AM  Radiology DG Chest Port 1 View  Result Date: 09/09/2021 CLINICAL DATA:  Chest pain. EXAM: PORTABLE CHEST 1 VIEW COMPARISON:  None. FINDINGS: The heart size and mediastinal contours are within normal limits. No consolidation, effusion, or pneumothorax. No acute osseous abnormality. IMPRESSION: No acute cardiopulmonary process. Electronically Signed   By: Brett Fairy M.D.   On: 09/09/2021 03:59    Procedures Procedures   Medications Ordered in ED Medications - No data to display  ED Course  I have reviewed the triage vital signs and the nursing notes.  Pertinent labs & imaging results that were available during my care of the patient were reviewed by me and considered in my medical decision making (see chart for details).    MDM Rules/Calculators/A&P                           Patient presents for seventh ER visit in 6 months.  Typical visits include abdominal pain, vomiting, and he has had extensive work-up in the past.  Today he reports chest wall pain, chronic abdominal pain and leg swelling. Labs are overall at his baseline.  EKG is  unremarkable Will obtain chest x-ray and reassess.  He is in no acute distress 4:30 AM Chest x-ray was personally reviewed and negative.  Patient has been sleeping.  He is point tender in his chest, therefore suspicion for ACS/PE/dissection is very low.  Patient admits to chronic abdominal pain with nausea and vomiting.  He reports that he is out of Phenergan and Zofran, this will be ordered.  Advised him to call his PCP today for follow-up due to repeat ER visits.  I explained to him that his lower extremity edema is likely due to malnutrition and low protein Patient is otherwise safe for discharge home. Final Clinical Impression(s) / ED Diagnoses Final diagnoses:  Pedal edema  Precordial pain    Rx / DC Orders ED Discharge Orders          Ordered    ondansetron (ZOFRAN-ODT) 8 MG disintegrating tablet  Every 8 hours PRN        09/09/21 0429    promethazine (PHENERGAN) 25 MG tablet  Every 6 hours PRN        09/09/21 0429             Ripley Fraise, MD 09/09/21 [phone]

## 2021-09-10 ENCOUNTER — Other Ambulatory Visit: Payer: Self-pay

## 2021-09-25 ENCOUNTER — Inpatient Hospital Stay (HOSPITAL_COMMUNITY)
Admission: EM | Admit: 2021-09-25 | Discharge: 2021-09-28 | DRG: 377 | Disposition: A | Discharge status: Home / Self Care | Payer: Medicaid Other | Attending: Internal Medicine | Admitting: Internal Medicine

## 2021-09-25 ENCOUNTER — Encounter (HOSPITAL_COMMUNITY): Payer: Self-pay | Admitting: Emergency Medicine

## 2021-09-25 ENCOUNTER — Emergency Department (HOSPITAL_COMMUNITY): Payer: Medicaid Other

## 2021-09-25 ENCOUNTER — Other Ambulatory Visit: Payer: Self-pay

## 2021-09-25 DIAGNOSIS — Z79899 Other long term (current) drug therapy: Secondary | ICD-10-CM

## 2021-09-25 DIAGNOSIS — K449 Diaphragmatic hernia without obstruction or gangrene: Secondary | ICD-10-CM | POA: Diagnosis present

## 2021-09-25 DIAGNOSIS — L89151 Pressure ulcer of sacral region, stage 1: Secondary | ICD-10-CM | POA: Diagnosis present

## 2021-09-25 DIAGNOSIS — R300 Dysuria: Secondary | ICD-10-CM

## 2021-09-25 DIAGNOSIS — Z8249 Family history of ischemic heart disease and other diseases of the circulatory system: Secondary | ICD-10-CM

## 2021-09-25 DIAGNOSIS — I10 Essential (primary) hypertension: Secondary | ICD-10-CM | POA: Diagnosis present

## 2021-09-25 DIAGNOSIS — Z90411 Acquired partial absence of pancreas: Secondary | ICD-10-CM

## 2021-09-25 DIAGNOSIS — L8990 Pressure ulcer of unspecified site, unspecified stage: Secondary | ICD-10-CM | POA: Insufficient documentation

## 2021-09-25 DIAGNOSIS — D72829 Elevated white blood cell count, unspecified: Secondary | ICD-10-CM | POA: Diagnosis not present

## 2021-09-25 DIAGNOSIS — K8689 Other specified diseases of pancreas: Secondary | ICD-10-CM

## 2021-09-25 DIAGNOSIS — Z681 Body mass index (BMI) 19 or less, adult: Secondary | ICD-10-CM

## 2021-09-25 DIAGNOSIS — F411 Generalized anxiety disorder: Secondary | ICD-10-CM | POA: Diagnosis present

## 2021-09-25 DIAGNOSIS — G8929 Other chronic pain: Secondary | ICD-10-CM

## 2021-09-25 DIAGNOSIS — R64 Cachexia: Secondary | ICD-10-CM | POA: Diagnosis present

## 2021-09-25 DIAGNOSIS — K219 Gastro-esophageal reflux disease without esophagitis: Secondary | ICD-10-CM | POA: Diagnosis present

## 2021-09-25 DIAGNOSIS — F329 Major depressive disorder, single episode, unspecified: Secondary | ICD-10-CM | POA: Diagnosis present

## 2021-09-25 DIAGNOSIS — E43 Unspecified severe protein-calorie malnutrition: Secondary | ICD-10-CM

## 2021-09-25 DIAGNOSIS — Z825 Family history of asthma and other chronic lower respiratory diseases: Secondary | ICD-10-CM

## 2021-09-25 DIAGNOSIS — K280 Acute gastrojejunal ulcer with hemorrhage: Principal | ICD-10-CM | POA: Diagnosis present

## 2021-09-25 DIAGNOSIS — K254 Chronic or unspecified gastric ulcer with hemorrhage: Secondary | ICD-10-CM | POA: Diagnosis present

## 2021-09-25 DIAGNOSIS — F17210 Nicotine dependence, cigarettes, uncomplicated: Secondary | ICD-10-CM | POA: Diagnosis present

## 2021-09-25 DIAGNOSIS — K529 Noninfective gastroenteritis and colitis, unspecified: Secondary | ICD-10-CM | POA: Diagnosis present

## 2021-09-25 DIAGNOSIS — E860 Dehydration: Secondary | ICD-10-CM | POA: Diagnosis present

## 2021-09-25 DIAGNOSIS — Z20822 Contact with and (suspected) exposure to covid-19: Secondary | ICD-10-CM | POA: Diagnosis present

## 2021-09-25 DIAGNOSIS — K922 Gastrointestinal hemorrhage, unspecified: Principal | ICD-10-CM | POA: Diagnosis present

## 2021-09-25 DIAGNOSIS — D509 Iron deficiency anemia, unspecified: Secondary | ICD-10-CM | POA: Diagnosis present

## 2021-09-25 DIAGNOSIS — K861 Other chronic pancreatitis: Secondary | ICD-10-CM | POA: Diagnosis present

## 2021-09-25 DIAGNOSIS — K209 Esophagitis, unspecified without bleeding: Secondary | ICD-10-CM | POA: Diagnosis present

## 2021-09-25 DIAGNOSIS — M419 Scoliosis, unspecified: Secondary | ICD-10-CM | POA: Diagnosis present

## 2021-09-25 LAB — URINALYSIS, ROUTINE W REFLEX MICROSCOPIC
Bilirubin Urine: NEGATIVE
Glucose, UA: NEGATIVE mg/dL
Hgb urine dipstick: NEGATIVE
Ketones, ur: 20 mg/dL — AB
Leukocytes,Ua: NEGATIVE
Nitrite: NEGATIVE
Protein, ur: 30 mg/dL — AB
Specific Gravity, Urine: 1.031 — ABNORMAL HIGH (ref 1.005–1.030)
pH: 5 (ref 5.0–8.0)

## 2021-09-25 LAB — COMPREHENSIVE METABOLIC PANEL
ALT: 13 U/L (ref 0–44)
AST: 19 U/L (ref 15–41)
Albumin: 2.3 g/dL — ABNORMAL LOW (ref 3.5–5.0)
Alkaline Phosphatase: 164 U/L — ABNORMAL HIGH (ref 38–126)
Anion gap: 11 (ref 5–15)
BUN: 8 mg/dL (ref 6–20)
CO2: 24 mmol/L (ref 22–32)
Calcium: 8.4 mg/dL — ABNORMAL LOW (ref 8.9–10.3)
Chloride: 96 mmol/L — ABNORMAL LOW (ref 98–111)
Creatinine, Ser: 0.76 mg/dL (ref 0.61–1.24)
GFR, Estimated: >60 mL/min (ref >60)
Glucose, Bld: 121 mg/dL — ABNORMAL HIGH (ref 70–99)
Potassium: 3.6 mmol/L (ref 3.5–5.1)
Sodium: 131 mmol/L — ABNORMAL LOW (ref 135–145)
Total Bilirubin: 0.7 mg/dL (ref 0.3–1.2)
Total Protein: 5.3 g/dL — ABNORMAL LOW (ref 6.5–8.1)

## 2021-09-25 LAB — TYPE AND SCREEN
ABO/RH(D): O POS
Antibody Screen: NEGATIVE

## 2021-09-25 LAB — CBC WITH DIFFERENTIAL/PLATELET
Abs Immature Granulocytes: 0.15 10*3/uL — ABNORMAL HIGH (ref 0.00–0.07)
Basophils Absolute: 0.1 10*3/uL (ref 0.0–0.1)
Basophils Relative: 1 %
Eosinophils Absolute: 0 10*3/uL (ref 0.0–0.5)
Eosinophils Relative: 0 %
HCT: 32.7 % — ABNORMAL LOW (ref 39.0–52.0)
Hemoglobin: 10.7 g/dL — ABNORMAL LOW (ref 13.0–17.0)
Immature Granulocytes: 2 %
Lymphocytes Relative: 21 %
Lymphs Abs: 2.1 10*3/uL (ref 0.7–4.0)
MCH: 30.1 pg (ref 26.0–34.0)
MCHC: 32.7 g/dL (ref 30.0–36.0)
MCV: 91.9 fL (ref 80.0–100.0)
Monocytes Absolute: 0.8 10*3/uL (ref 0.1–1.0)
Monocytes Relative: 8 %
Neutro Abs: 6.9 10*3/uL (ref 1.7–7.7)
Neutrophils Relative %: 68 %
Platelets: 305 10*3/uL (ref 150–400)
RBC: 3.56 MIL/uL — ABNORMAL LOW (ref 4.22–5.81)
RDW: 13.8 % (ref 11.5–15.5)
WBC: 10 10*3/uL (ref 4.0–10.5)
nRBC: 0 % (ref 0.0–0.2)

## 2021-09-25 LAB — RESP PANEL BY RT-PCR (FLU A&B, COVID) ARPGX2
Influenza A by PCR: NEGATIVE
Influenza B by PCR: NEGATIVE
SARS Coronavirus 2 by RT PCR: NEGATIVE

## 2021-09-25 LAB — HEMOGLOBIN AND HEMATOCRIT, BLOOD
HCT: 31.5 % — ABNORMAL LOW (ref 39.0–52.0)
Hemoglobin: 10.4 g/dL — ABNORMAL LOW (ref 13.0–17.0)

## 2021-09-25 LAB — POC OCCULT BLOOD, ED: Fecal Occult Bld: POSITIVE — AB

## 2021-09-25 LAB — LIPASE, BLOOD: Lipase: 18 U/L (ref 11–51)

## 2021-09-25 MED ORDER — SODIUM CHLORIDE 0.9 % IV BOLUS
1000.0000 mL | Freq: Once | INTRAVENOUS | Status: AC
  Administered 2021-09-25: 17:00:00 1000 mL via INTRAVENOUS

## 2021-09-25 MED ORDER — ACETAMINOPHEN 325 MG PO TABS
650.0000 mg | ORAL_TABLET | Freq: Four times a day (QID) | ORAL | Status: DC | PRN
  Administered 2021-09-26: 05:00:00 650 mg via ORAL

## 2021-09-25 MED ORDER — PANCRELIPASE (LIP-PROT-AMYL) 12000-38000 UNITS PO CPEP
24000.0000 [IU] | ORAL_CAPSULE | Freq: Three times a day (TID) | ORAL | Status: DC
  Administered 2021-09-25: 21:00:00 24000 [IU] via ORAL
  Filled 2021-09-25 (×3): qty 2

## 2021-09-25 MED ORDER — IOHEXOL 300 MG/ML  SOLN
100.0000 mL | Freq: Once | INTRAMUSCULAR | Status: AC | PRN
  Administered 2021-09-25: 17:00:00 100 mL via INTRAVENOUS

## 2021-09-25 MED ORDER — PANTOPRAZOLE SODIUM 40 MG IV SOLR
40.0000 mg | Freq: Once | INTRAVENOUS | Status: AC
  Administered 2021-09-25: 17:00:00 40 mg via INTRAVENOUS
  Filled 2021-09-25: qty 40

## 2021-09-25 MED ORDER — ONDANSETRON HCL 4 MG PO TABS
4.0000 mg | ORAL_TABLET | Freq: Four times a day (QID) | ORAL | Status: DC | PRN
  Administered 2021-09-26 – 2021-09-28 (×3): 4 mg via ORAL
  Filled 2021-09-25 (×4): qty 1

## 2021-09-25 MED ORDER — LACTATED RINGERS IV BOLUS
1000.0000 mL | Freq: Once | INTRAVENOUS | Status: AC
Start: 2021-09-25 — End: 2021-09-25
  Administered 2021-09-25: 20:00:00 1000 mL via INTRAVENOUS

## 2021-09-25 MED ORDER — ONDANSETRON HCL 4 MG/2ML IJ SOLN
4.0000 mg | Freq: Four times a day (QID) | INTRAMUSCULAR | Status: DC | PRN
  Administered 2021-09-25: 23:00:00 4 mg via INTRAVENOUS
  Filled 2021-09-25: qty 2

## 2021-09-25 MED ORDER — ACETAMINOPHEN 650 MG RE SUPP: 650.0000 mg | Freq: Four times a day (QID) | RECTAL | Status: DC | PRN

## 2021-09-25 NOTE — ED Triage Notes (Signed)
Patient arrived with EMS from home reports dysuria " burning when urinating" and emesis onset this week .

## 2021-09-25 NOTE — ED Provider Notes (Signed)
[facility] EMERGENCY DEPARTMENT Provider Note   CSN: [id number] Arrival date & time: 09/25/21  0049     History Chief Complaint  Patient presents with   Dysuria / Emesis    Brent Rogers is a 55 y.o. male.  HPI  Patient with medical history including benign tumor of the pancreas, status post Whipple 2015, hypertension, chronic pancreatitis presents with chief complaints of flank pain dysuria nausea vomiting.  Patient states flank pain started last week on Monday.  Describes the pain as a dull-like sensation, radiating to his left lower abdomen, is worsened with urination improves with rest, he states he has decreased urination and burning when he urinates, denies testicular pain or penile discharge, he also has notable nausea and vomiting, this is also going on for about a week's time, he denies hematemesis or coffee-ground emesis, states this is abnormal for him.  He also notes that over the last 4 days she is having dark tarry stools, no history of GI bleeds currently not on anticoagulant, he denies feeling lightheaded dizziness or feeling more fatigued, denies NSAID use or alcohol use.  does not endorse fevers, chills, chest pain, shortness of breath, general body aches.  At the reviewing patient's charts patient had had a colonoscopy 06/13 of this year diverticulosis without any acute findings, EDG was also performed at that time esophagus stenosis, bleeding jejunal mucosa.  Past Medical History:  Diagnosis Date   Benign tumor of endocrine pancreas    Chronic pancreatitis ([place])    S/P Whipple   Foot ulcer with fat layer exposed ([place])    Hypertension    Migraine    "@ least once/month" (11/12/2015)   Pancreatic abnormality    CT  shows mass   Pneumonia 11/12/2015   Scoliosis     Patient Active Problem List   Diagnosis Date Noted   Major depressive disorder, recurrent episode, severe ([place]) 09/04/2021   GAD (generalized anxiety disorder) 09/04/2021    Major depressive disorder, recurrent episode, moderate ([place]) 03/24/2021   Open wound of pharynx without complication 40/98/1191   Atherosclerosis of native coronary artery of native heart without angina pectoris    Blood in stool    Iron deficiency anemia    Syncope 03/06/2021   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   Chronic pain syndrome 06/07/2019   Depression 06/07/2019   Chronic pancreatitis ([place]) 05/01/2017   Foot callus 12/25/2015   Onychomycosis of toenail 12/25/2015   Fatty liver 11/12/2015   GERD (gastroesophageal reflux disease) 11/12/2015   HTN (hypertension) 07/10/2015   S/P cholecystectomy 07/10/2015   Tobacco abuse 12/26/2014   Protein-calorie malnutrition, severe ([place]) 11/20/2014   Exocrine pancreatic insufficiency ([place]) 12/23/2013    Past Surgical History:  Procedure Laterality Date   BACK SURGERY     COLONOSCOPY WITH PROPOFOL N/A 03/11/2021   Procedure: COLONOSCOPY WITH PROPOFOL;  Surgeon: Carol Ada, MD;  Location: [place];  Service: Endoscopy;  Laterality: N/A;   ESOPHAGOGASTRODUODENOSCOPY (EGD) WITH PROPOFOL N/A 03/11/2021   Procedure: ESOPHAGOGASTRODUODENOSCOPY (EGD) WITH PROPOFOL;  Surgeon: Carol Ada, MD;  Location: [place];  Service: Endoscopy;  Laterality: N/A;   EUS N/A 09/21/2013   Procedure: ESOPHAGEAL ENDOSCOPIC ULTRASOUND (EUS) RADIAL;  Surgeon: Beryle Beams, MD;  Location: [facility];  Service: Endoscopy;  Laterality: N/A;   EUS N/A 09/30/2013   Procedure: UPPER ENDOSCOPIC ULTRASOUND (EUS) LINEAR;  Surgeon: Beryle Beams, MD;  Location: [facility];  Service: Endoscopy;  Laterality: N/A;   FINE NEEDLE ASPIRATION N/A 09/21/2013  Procedure: FINE NEEDLE ASPIRATION (FNA) LINEAR;  Surgeon: Beryle Beams, MD;  Location: [facility];  Service: Endoscopy;  Laterality: N/A;   FINGER FRACTURE SURGERY Left 1995   5th digit   FRACTURE SURGERY     HEMOSTASIS CLIP PLACEMENT  03/11/2021   Procedure: HEMOSTASIS CLIP PLACEMENT;  Surgeon:  Carol Ada, MD;  Location: [facility] ENDOSCOPY;  Service: Endoscopy;;   HEMOSTASIS CONTROL  03/11/2021   Procedure: HEMOSTASIS CONTROL;  Surgeon: Carol Ada, MD;  Location: [place];  Service: Endoscopy;;   KNEE ARTHROSCOPY Bilateral 1987-1989   right-left   LAPAROSCOPY N/A 12/01/2013   Procedure: LAPAROSCOPY DIAGNOSTIC PANCREATICODUODENECTOMY WITH BILIARY AND PANCREATIC STENTS;  Surgeon: Stark Klein, MD;  Location: [facility];  Service: General;  Laterality: N/A;   LUMBAR [place] SURGERY  1990's   SAVORY DILATION N/A 03/11/2021   Procedure: SAVORY DILATION;  Surgeon: Carol Ada, MD;  Location: [place];  Service: Endoscopy;  Laterality: N/A;   WHIPPLE PROCEDURE  12/01/2013       Family History  Problem Relation Age of Onset   Cancer Mother        unsure   COPD Mother    Hypertension Mother    Hypertension Father    COPD Father    CAD Sister        possible has stent per pt    Social History   Tobacco Use   Smoking status: Every Day    Packs/day: 1.00    Types: Cigarettes   Smokeless tobacco: Former    Types: Snuff   Tobacco comments:    "used snuff 10-15 years in my 20s-30s"  Vaping Use   Vaping Use: Never used  Substance Use Topics   Alcohol use: Yes    Alcohol/week: 2.0 standard drinks    Types: 2 Cans of beer per week    Comment: monthly   Drug use: No    Home Medications Prior to Admission medications   Medication Sig Start Date End Date Taking? Authorizing Provider  amLODipine (NORVASC) 5 MG tablet Take 1 tablet (5 mg total) by mouth daily. 03/21/21   Ladell Pier, MD  famotidine (PEPCID) 20 MG tablet Take 1 tablet (20 mg total) by mouth 2 (two) times daily. Patient taking differently: Take 20 mg by mouth daily. 09/06/21 10/06/21  France Ravens, MD  ferrous sulfate 325 (65 FE) MG tablet Take 1 tablet (325 mg total) by mouth daily. Patient taking differently: Take 325 mg by mouth every other day. 03/16/21   Ladell Pier, MD  mirtazapine (REMERON) 15 MG  tablet Take 1 tablet (15 mg total) by mouth at bedtime. Patient not taking: Reported on 09/09/2021 09/06/21 10/06/21  France Ravens, MD  ondansetron (ZOFRAN-ODT) 8 MG disintegrating tablet Take 1 tablet (8 mg total) by mouth every 8 (eight) hours as needed. 09/09/21   Ripley Fraise, MD  Pancrelipase, Lip-Prot-Amyl, (CREON) 24000-76000 units CPEP Take 1 capsule (24,000 Units total) by mouth with breakfast, with lunch, and with evening meal. 03/16/21   Ladell Pier, MD  pantoprazole (PROTONIX) 40 MG tablet Take 1 tablet (40 mg total) by mouth daily. 03/16/21   Ladell Pier, MD  promethazine (PHENERGAN) 25 MG tablet Take 1 tablet (25 mg total) by mouth every 6 (six) hours as needed for nausea or vomiting. 09/09/21   Ripley Fraise, MD  sucralfate (CARAFATE) 1 GM/10ML suspension Take 10 mLs (1 g total) by mouth 4 (four) times daily -  with meals and at bedtime. Patient not taking: Reported  on 09/09/2021 09/06/21 10/06/21  France Ravens, MD  venlafaxine XR (EFFEXOR-XR) 75 MG 24 hr capsule Take 1 capsule (75 mg total) by mouth daily with breakfast. Patient not taking: Reported on 09/09/2021 09/06/21 10/06/21  France Ravens, MD    Allergies    Patient has no known allergies.  Review of Systems   Review of Systems  Constitutional:  Negative for chills and fever.  HENT:  Negative for congestion.   Respiratory:  Negative for shortness of breath.   Cardiovascular:  Negative for chest pain.  Gastrointestinal:  Positive for abdominal pain, blood in stool, nausea and vomiting.  Genitourinary:  Positive for decreased urine volume, difficulty urinating and flank pain. Negative for enuresis.  Musculoskeletal:  Negative for back pain.  Skin:  Negative for rash.  Neurological:  Negative for headaches.  Hematological:  Does not bruise/bleed easily.   Physical Exam Updated Vital Signs BP 94/70 (BP Location: Left Arm)    Pulse 72    Temp 98.6 F (37 C)    Resp 16    SpO2 100%   Physical Exam Vitals and  nursing note reviewed. Exam conducted with a chaperone present.  Constitutional:      General: He is not in acute distress.    Appearance: He is not ill-appearing.  HENT:     Head: Normocephalic and atraumatic.     Nose: No congestion.     Mouth/Throat:     Mouth: Mucous membranes are moist.     Pharynx: Oropharynx is clear.  Eyes:     Conjunctiva/sclera: Conjunctivae normal.  Cardiovascular:     Rate and Rhythm: Normal rate and regular rhythm.     Pulses: Normal pulses.     Heart sounds: No murmur heard.   No friction rub. No gallop.  Pulmonary:     Effort: No respiratory distress.     Breath sounds: No wheezing, rhonchi or rales.  Abdominal:     General: Abdomen is flat.     Tenderness: There is abdominal tenderness. There is no right CVA tenderness or left CVA tenderness.     Comments: Abdomen visualize has postsurgical*above the umbilicus, abdomen is nondistended normal bowel sounds, dull to percussion, he had noted left lower quadrant tenderness as well as left side pain, there is no guarding, rebound tenderness, peritoneal sign, negative Murphy sign McBurney point.  Genitourinary:    Rectum: Guaiac result positive.     Comments: With chaperone present rectal exam was performed there is no noted external hemorrhoids present, no palpable internal hemorrhoids, patient had dark tarry stool consistent with melena.  Patient is heme positive. Musculoskeletal:     Right lower leg: No edema.     Left lower leg: No edema.  Skin:    General: Skin is warm and dry.  Neurological:     Mental Status: He is alert.  Psychiatric:        Mood and Affect: Mood normal.    ED Results / Procedures / Treatments   Labs (all labs ordered are listed, but only abnormal results are displayed) Labs Reviewed  CBC WITH DIFFERENTIAL/PLATELET - Abnormal; Notable for the following components:      Result Value   RBC 3.56 (*)    Hemoglobin 10.7 (*)    HCT 32.7 (*)    Abs Immature Granulocytes 0.15  (*)    All other components within normal limits  COMPREHENSIVE METABOLIC PANEL - Abnormal; Notable for the following components:   Sodium 131 (*)    Chloride  96 (*)    Glucose, Bld 121 (*)    Calcium 8.4 (*)    Total Protein 5.3 (*)    Albumin 2.3 (*)    Alkaline Phosphatase 164 (*)    All other components within normal limits  URINALYSIS, ROUTINE W REFLEX MICROSCOPIC - Abnormal; Notable for the following components:   Color, Urine AMBER (*)    APPearance CLOUDY (*)    Specific Gravity, Urine 1.031 (*)    Ketones, ur 20 (*)    Protein, ur 30 (*)    Bacteria, UA RARE (*)    All other components within normal limits  POC OCCULT BLOOD, ED - Abnormal; Notable for the following components:   Fecal Occult Bld POSITIVE (*)    All other components within normal limits  URINE CULTURE  RESP PANEL BY RT-PCR (FLU A&B, COVID) ARPGX2  LIPASE, BLOOD  TYPE AND SCREEN    EKG None  Radiology No results found.  Procedures Procedures   Medications Ordered in ED Medications  sodium chloride 0.9 % bolus 1,000 mL (has no administration in time range)  pantoprazole (PROTONIX) injection 40 mg (has no administration in time range)    ED Course  I have reviewed the triage vital signs and the nursing notes.  Pertinent labs & imaging results that were available during my care of the patient were reviewed by me and considered in my medical decision making (see chart for details).  Clinical Course as of 09/25/21 1546  Wed Sep 25, 2021  1513 Chloride(!): 96 [WF]    Clinical Course User Index [WF] Marcello Fennel, PA-C   MDM Rules/Calculators/A&P                         Initial impression patient is nontoxic-appearing, vital signs are reassuring, she is obtain basic lab work-up, will add on Hemoccult, and reevaluate.  Work-up-CBC shows downtrending hemoglobin 10.7, decreased from 12.1/3 weeks time.,  CMP shows sodium of 131 chloride 96 glucose 121 calcium 8.4 total protein 5.3, lipase  is 18, Hemoccult positive.  Reassessment-patient is a positive Hemoccult with downtrending hemoglobin will start him on fluids, Protonix, type and screen.  I suspect this is likely from the bleeding jejunal mucosa seen on his previous EGD.   Noted the patient had 2 low BP readings assessed the patient myself and had a reading of 106/56  will continue to monitor.  Will withhold pain medication at this time until fluids are running.  will also add on CT abdomen pelvis for further evaluation as he has left lower quadrant tenderness with new onset of GI bleed.   Rule out-low suspicion for lower lobe pneumonia as lung sounds are clear bilaterally, will defer imaging at this time.  I have low suspicion for liver or gallbladder abnormality as he has no right upper quadrant tenderness, liver enzymes,, T bili all within normal limits, alk phos slightly elevated but this is nonspecific and I doubt this secondary due to gallbladder normality.  Low suspicion for pancreatitis as lipase is within normal limits but will await CT imaging for further evaluation..  Low suspicion for ruptured stomach ulcer as she has no peritoneal sign present on exam.  Low suspicion for bowel obstruction as abdomen is nondistended normal bowel sounds, so passing gas and having normal bowel movements.  Low suspicion for UTI and/or [place] as he has no nitrates or leukocytes, really rare bacteria.  We will culture for further evaluation.  Although suspicion for  kidney stone as recently is atypical etiology will await CT imaging for further evaluation.  Plan-due to shift change patient will be held off to [employer] PA-C hip was provided HPI current work-up, likely disposition  Recommended admission for likely upper GI bleed, await CT abdomen pelvis consult GI and likely admit to the hospitalist team.     Final Clinical Impression(s) / ED Diagnoses Final diagnoses:  Gastrointestinal hemorrhage, unspecified gastrointestinal hemorrhage  type  Dysuria    Rx / DC Orders ED Discharge Orders     None        Marcello Fennel, PA-C 09/25/21 1546    [name] Lemming, MD 09/25/21 2117

## 2021-09-25 NOTE — ED Provider Notes (Signed)
Signout from [employer] at shift change.   Patient with history of chronic pain, chronic pancreatitis, previous Whipple procedure in 2015, EGD 02/2021 with findings as below -- presents for abdominal pain and melena.  States black stools over the past 4 days.  He feels that his current abdominal pain is different than chronic abdominal pain.  Currently he has pain that is along the left abdomen at the level of the umbilicus that radiates to the right side when he tries to urinate.  His typical pain is left-sided abdominal pain with radiation to the left flank.  Patient has modest decrease in hemoglobin over the past month from 12 >> 10 with heme positive melanotic stools today.  CT pending.  Will speak with patient's GI physician, Dr. Benson Norway.   EGD 02/2021: - Benign-appearing esophageal stenosis. - A classic Whipple was found, characterized by healthy appearing mucosa. - Bleeding friable jejunal mucosa. Treated with a monopolar probe. Clips (MR conditional) were placed. - No specimens collected.  4:29 PM Spoke with Dr. Collene Mares of GI by telephone.  After review of records patient saw Dr. Benson Norway in hospital only and did not follow-up as outpatient so he is unassigned GI. Will speak with unassigned GI.   4:51 PM Spoke with Amy Esterwood with [facility]. They will consult. Requests clear liquid diet this evening and NPO after midnight.   5:43 PM CT with no acute findings, will request unassigned admission.   BP 94/70 (BP Location: Left Arm)    Pulse 72    Temp 98.6 F (37 C)    Resp 16    SpO2 100%   5:58 PM Spoke with IMTS who will see patient.    Carlisle Cater, PA-C 09/25/21 Gari Crown, MD 10/10/21 2328

## 2021-09-25 NOTE — ED Provider Notes (Signed)
Emergency Medicine Provider Triage Evaluation Note  Brent Rogers , a 55 y.o. male  was evaluated in triage.  Pt complains of dysuria and difficulty urinating.  Also reports some nausea/vomiting as well.  No flank or back pain.  No fevers.  Review of Systems  Positive: Dysuria, vomiting Negative: fever  Physical Exam  BP 100/74    Pulse (!) 102    Temp 98.6 F (37 C)    Resp 14    SpO2 100%  Gen:   Awake, no distress   Resp:  Normal effort  MSK:   Moves extremities without difficulty  Other:    Medical Decision Making  Medically screening exam initiated at 1:45 AM.  Appropriate orders placed.  Brent Rogers was informed that the remainder of the evaluation will be completed by another provider, this initial triage assessment does not replace that evaluation, and the importance of remaining in the ED until their evaluation is complete.  Dysuria, difficulty urinating, vomiting.  Will check labs, UA.   Larene Pickett, PA-C 73/73/66 [id number]    Delora Fuel, MD 47/07/61 [phone]

## 2021-09-25 NOTE — H&P (Signed)
Date: 09/25/2021               Patient Name:  Brent Rogers MRN: [id number]  DOB: May 22, 1966 Age / Sex: 55 y.o., male   PCP: Ladell Pier, MD         Medical Service: Internal Medicine Teaching Service         Attending Physician: Dr. Velna Ochs, MD    First Contact: Delene Ruffini, MD Pager: Lysbeth Penner [phone]  Second Contact: Harvie Heck, MD Pager: Michel Bickers [phone]       After Hours (After 5p/  First Contact Pager: [phone]  weekends / holidays): Second Contact Pager: [phone]   SUBJECTIVE   Chief Complaint: abd pain and dark stools  History of Present Illness: patient with hx of HTN, chronic pancreatitis on Creo s/p whipple with chronic abd pain presents with complaints of lower abdominal pain and dark stools for the past 1 week.   He reports that new abdominal pain feels different from chronic pain. He has chronic pancreatitis with a previous Whipple procedure in 2015 and resulting chronic constant pain. New pain is intermittent and sharp, most noticeable approximately 5 minutes after eating. States it feels as though things get caught in his chest and he has significant pain in abd when he is able to eat.  Endorses nausea, vomiting, and diarrhea which began few days prior as well.  Had EGD 02/2021 which showed bleeding friable jejunal mucosa. No blood in emesis. Poor PO intake recently with progressive weightloss over last 6 months, approximately 30 pounds. Acute worsening of PO intake due to N/V. Also having pain along left adb at the level of umbilicus and radiates to right side after eating and when trying to urinate. Patient also reports decreased urination and burning with urination.  Chronic pain typically left sided with radiation to left flank.     ED Course: CTA obtained, showed no acute processes. [facility] was consulted, patient to be CLD and NPO at midnight.   Meds:  No outpatient medications have been marked as taking for the 09/25/21 encounter [facility] Encounter).     Past Medical History:  Diagnosis Date   Benign tumor of endocrine pancreas    Chronic pancreatitis ([place])    S/P Whipple   Foot ulcer with fat layer exposed ([place])    Hypertension    Migraine    "@ least once/month" (11/12/2015)   Pancreatic abnormality    CT  shows mass   Pneumonia 11/12/2015   Scoliosis     Past Surgical History:  Procedure Laterality Date   BACK SURGERY     COLONOSCOPY WITH PROPOFOL N/A 03/11/2021   Procedure: COLONOSCOPY WITH PROPOFOL;  Surgeon: Carol Ada, MD;  Location: [place];  Service: Endoscopy;  Laterality: N/A;   ESOPHAGOGASTRODUODENOSCOPY (EGD) WITH PROPOFOL N/A 03/11/2021   Procedure: ESOPHAGOGASTRODUODENOSCOPY (EGD) WITH PROPOFOL;  Surgeon: Carol Ada, MD;  Location: [place];  Service: Endoscopy;  Laterality: N/A;   EUS N/A 09/21/2013   Procedure: ESOPHAGEAL ENDOSCOPIC ULTRASOUND (EUS) RADIAL;  Surgeon: Beryle Beams, MD;  Location: [facility];  Service: Endoscopy;  Laterality: N/A;   EUS N/A 09/30/2013   Procedure: UPPER ENDOSCOPIC ULTRASOUND (EUS) LINEAR;  Surgeon: Beryle Beams, MD;  Location: [facility];  Service: Endoscopy;  Laterality: N/A;   FINE NEEDLE ASPIRATION N/A 09/21/2013   Procedure: FINE NEEDLE ASPIRATION (FNA) LINEAR;  Surgeon: Beryle Beams, MD;  Location: [facility];  Service: Endoscopy;  Laterality: N/A;   [place]  5th digit   FRACTURE SURGERY     HEMOSTASIS CLIP PLACEMENT  03/11/2021   Procedure: HEMOSTASIS CLIP PLACEMENT;  Surgeon: Carol Ada, MD;  Location: [place];  Service: Endoscopy;;   HEMOSTASIS CONTROL  03/11/2021   Procedure: HEMOSTASIS CONTROL;  Surgeon: Carol Ada, MD;  Location: [place];  Service: Endoscopy;;   KNEE ARTHROSCOPY Bilateral 1987-1989   right-left   LAPAROSCOPY N/A 12/01/2013   Procedure: LAPAROSCOPY DIAGNOSTIC PANCREATICODUODENECTOMY WITH BILIARY AND PANCREATIC STENTS;  Surgeon: Stark Klein, MD;  Location: [facility];  Service: General;   Laterality: N/A;   LUMBAR [place] SURGERY  1990's   SAVORY DILATION N/A 03/11/2021   Procedure: SAVORY DILATION;  Surgeon: Carol Ada, MD;  Location: [place];  Service: Endoscopy;  Laterality: N/A;   WHIPPLE PROCEDURE  12/01/2013    Social:  Lives With: none Occupation: disabled Support: family in area Level of Function: independent ADLs PCP: Karle Plumber MD Substances: infrequent alcohol use, occasional tobacco use  Family History: none  Allergies: Allergies as of 09/25/2021   (No Known Allergies)    Review of Systems: A complete ROS was negative except as per HPI.   OBJECTIVE:   Physical Exam: Blood pressure 107/85, pulse 79, temperature 98.6 F (37 C), resp. rate 16, SpO2 100 %.  Constitutional: cachectic, chronically ill appearing, in no acute distress HENT: normocephalic atraumatic, dry mucus membranes Eyes: conjunctiva non-erythematous Neck: supple Cardiovascular: regular rate and rhythm, no m/r/g Pulmonary/Chest: normal work of breathing on room air, lungs clear to auscultation bilaterally Abdominal: soft, tender, non-distended MSK: decreased muscle bulk, normal tone Neurological: alert & oriented x 3, 5/5 strength in bilateral upper and lower extremities, normal gait Skin: warm and dry, poor skin turgor Psych: mood and affect appropriate  Labs: CBC    Component Value Date/Time   WBC 10.0 09/25/2021 0149   RBC 3.56 (L) 09/25/2021 0149   HGB 10.4 (L) 09/25/2021 1842   HGB 10.9 (L) 03/21/2021 1115   HCT 31.5 (L) 09/25/2021 1842   HCT 32.6 (L) 03/21/2021 1115   PLT 305 09/25/2021 0149   PLT 295 03/21/2021 1115   MCV 91.9 09/25/2021 0149   MCV 81 03/21/2021 1115   MCH 30.1 09/25/2021 0149   MCHC 32.7 09/25/2021 0149   RDW 13.8 09/25/2021 0149   RDW 17.9 (H) 03/21/2021 1115   LYMPHSABS 2.1 09/25/2021 0149   MONOABS 0.8 09/25/2021 0149   EOSABS 0.0 09/25/2021 0149   BASOSABS 0.1 09/25/2021 0149     CMP     Component Value Date/Time   NA 131  (L) 09/25/2021 0149   NA 140 06/22/2020 1648   K 3.6 09/25/2021 0149   CL 96 (L) 09/25/2021 0149   CO2 24 09/25/2021 0149   GLUCOSE 121 (H) 09/25/2021 0149   BUN 8 09/25/2021 0149   BUN 4 (L) 06/22/2020 1648   CREATININE 0.76 09/25/2021 0149   CREATININE 0.91 10/15/2015 1517   CALCIUM 8.4 (L) 09/25/2021 0149   PROT 5.3 (L) 09/25/2021 0149   PROT 6.2 06/22/2020 1648   ALBUMIN 2.3 (L) 09/25/2021 0149   ALBUMIN 3.5 (L) 06/22/2020 1648   AST 19 09/25/2021 0149   ALT 13 09/25/2021 0149   ALKPHOS 164 (H) 09/25/2021 0149   BILITOT 0.7 09/25/2021 0149   BILITOT 0.2 06/22/2020 1648   GFRNONAA >60 09/25/2021 0149   GFRNONAA >89 10/15/2015 1517   GFRAA 128 06/22/2020 1648   GFRAA >89 10/15/2015 1517    Imaging: CT ABDOMEN PELVIS W CONTRAST  Result Date: 09/25/2021  CLINICAL DATA:  Left lower quadrant abdominal pain. EXAM: CT ABDOMEN AND PELVIS WITH CONTRAST TECHNIQUE: Multidetector CT imaging of the abdomen and pelvis was performed using the standard protocol following bolus administration of intravenous contrast. CONTRAST:  175mL OMNIPAQUE IOHEXOL 300 MG/ML  SOLN COMPARISON:  August 13, 2021 FINDINGS: Lower chest: No acute abnormality. Hepatobiliary: No suspicious hepatic lesion. Status post cholecystectomy and hepaticojejunostomy with postoperative pneumobilia. Pancreas: Status post Whipple pancreaticoduodenectomy. No pancreatic ductal dilation or evidence of acute inflammation. Spleen: Normal in size without focal abnormality. Adrenals/Urinary Tract: Bilateral adrenal glands are unremarkable. No hydronephrosis. Bilateral renal cysts. No solid enhancing renal mass. Urinary bladder is unremarkable for degree of distension. Stomach/Bowel: Status post distal gastrectomy and gastrojejunostomy. No pathologic dilation of large or small bowel. Visualized portions of the appendix appear normal additionally there is no pericecal inflammation. No evidence of acute bowel inflammation. Vascular/Lymphatic:  Aortic and branch vessel atherosclerosis without abdominal aortic aneurysm. No pathologically enlarged abdominal or pelvic lymph nodes. Reproductive: Prostate is unremarkable. Other: No significant abdominopelvic free fluid. No pneumoperitoneum. Musculoskeletal: Thoracolumbar spondylosis. No acute osseous abnormality. IMPRESSION: 1. No acute findings in the abdomen or pelvis. 2. Status post Whipple pancreaticoduodenectomy. 3.  Aortic Atherosclerosis (ICD10-I70.0). Electronically Signed   By: Dahlia Bailiff M.D.   On: 09/25/2021 17:34    EKG: personally reviewed my interpretation isnormal sinus   ASSESSMENT & PLAN:    Assessment & Plan by Problem: Principal Problem:   GI bleed   JEET SHOUGH is a 55 y.o. with pertinent hx of HTN, chronic pancreatitis on Creon s/p whipple with chronic abd pain presents with complaints of lower abdominal pain and dark stools for the past 1 weekon hospital day 0  # melena # abd pain - reports dark tarry stools. FOBT positive. EGD 02/2021 showing friable jejunal mucosa, suspect this to be source of bleed.  - hgb has been stable in 10's. Will continue to monitor and transfuse as needed.  - f/u gi recs, possible EGD tomorrow.  - NPO at midnight  #weightloss # chronic pancreatitis requiring creon # s/p cholecystectomy # Nausea, vomiting, diarrhea - patient reporting approximately 30 pound weight loss over last 6 months. Suspect this may be related to pancreatitis insufficiency. Likely exacerbated by recent nausea and vomiting, creon may not be able to take effect. Poor PO intake overall also impacting weight gain.  - patient albumin 2.3, he has some LEE in feet bilaterally - will consult nutrition  - can consider SLP eval - may need IVFs for dehydration. Suspect hyponatremia to be hypovolemic.  - zofran  #diminished pedal pulses - Lower extremities cool. Unable to palpate pedal pulses. Unable to find pulses with doppler. - Will obtain ABIs  # iron  deficiency anemia - monitor and transfuse as necessary.  # HTN - monitor.  # MDD  Diet:  CLD, NPO at midnight VTE: SCDs IVF: LR,Bolus Code: Full  Prior to Admission Living Arrangement: Home, living   Anticipated Discharge Location: Home Barriers to Discharge: medical stability  Dispo: Admit patient to Inpatient with expected length of stay greater than 2 midnights.  Signed: Delene Ruffini, MD Internal Medicine Resident PGY-1 Pager: [phone]  09/25/2021, 7:29 PM

## 2021-09-26 ENCOUNTER — Observation Stay (HOSPITAL_COMMUNITY): Payer: Medicaid Other | Admitting: Anesthesiology

## 2021-09-26 ENCOUNTER — Encounter (HOSPITAL_COMMUNITY)
Admission: EM | Disposition: A | Discharge status: Home / Self Care | Payer: Self-pay | Source: Home / Self Care | Attending: Internal Medicine

## 2021-09-26 ENCOUNTER — Observation Stay (HOSPITAL_COMMUNITY): Payer: Medicaid Other

## 2021-09-26 ENCOUNTER — Encounter (HOSPITAL_COMMUNITY): Payer: Self-pay | Admitting: Internal Medicine

## 2021-09-26 DIAGNOSIS — Z681 Body mass index (BMI) 19 or less, adult: Secondary | ICD-10-CM | POA: Diagnosis not present

## 2021-09-26 DIAGNOSIS — D509 Iron deficiency anemia, unspecified: Secondary | ICD-10-CM | POA: Diagnosis present

## 2021-09-26 DIAGNOSIS — Z8249 Family history of ischemic heart disease and other diseases of the circulatory system: Secondary | ICD-10-CM | POA: Diagnosis not present

## 2021-09-26 DIAGNOSIS — M419 Scoliosis, unspecified: Secondary | ICD-10-CM | POA: Diagnosis present

## 2021-09-26 DIAGNOSIS — I10 Essential (primary) hypertension: Secondary | ICD-10-CM | POA: Diagnosis present

## 2021-09-26 DIAGNOSIS — Z79899 Other long term (current) drug therapy: Secondary | ICD-10-CM | POA: Diagnosis not present

## 2021-09-26 DIAGNOSIS — L8990 Pressure ulcer of unspecified site, unspecified stage: Secondary | ICD-10-CM | POA: Insufficient documentation

## 2021-09-26 DIAGNOSIS — K2970 Gastritis, unspecified, without bleeding: Secondary | ICD-10-CM

## 2021-09-26 DIAGNOSIS — L89151 Pressure ulcer of sacral region, stage 1: Secondary | ICD-10-CM | POA: Diagnosis present

## 2021-09-26 DIAGNOSIS — E860 Dehydration: Secondary | ICD-10-CM | POA: Diagnosis present

## 2021-09-26 DIAGNOSIS — K8689 Other specified diseases of pancreas: Secondary | ICD-10-CM | POA: Diagnosis not present

## 2021-09-26 DIAGNOSIS — K449 Diaphragmatic hernia without obstruction or gangrene: Secondary | ICD-10-CM | POA: Diagnosis present

## 2021-09-26 DIAGNOSIS — Z20822 Contact with and (suspected) exposure to covid-19: Secondary | ICD-10-CM | POA: Diagnosis present

## 2021-09-26 DIAGNOSIS — K529 Noninfective gastroenteritis and colitis, unspecified: Secondary | ICD-10-CM | POA: Diagnosis present

## 2021-09-26 DIAGNOSIS — K921 Melena: Secondary | ICD-10-CM | POA: Diagnosis not present

## 2021-09-26 DIAGNOSIS — K861 Other chronic pancreatitis: Secondary | ICD-10-CM | POA: Diagnosis present

## 2021-09-26 DIAGNOSIS — K280 Acute gastrojejunal ulcer with hemorrhage: Secondary | ICD-10-CM | POA: Diagnosis present

## 2021-09-26 DIAGNOSIS — K259 Gastric ulcer, unspecified as acute or chronic, without hemorrhage or perforation: Secondary | ICD-10-CM

## 2021-09-26 DIAGNOSIS — K219 Gastro-esophageal reflux disease without esophagitis: Secondary | ICD-10-CM | POA: Diagnosis present

## 2021-09-26 DIAGNOSIS — R112 Nausea with vomiting, unspecified: Secondary | ICD-10-CM

## 2021-09-26 DIAGNOSIS — K922 Gastrointestinal hemorrhage, unspecified: Secondary | ICD-10-CM

## 2021-09-26 DIAGNOSIS — F411 Generalized anxiety disorder: Secondary | ICD-10-CM | POA: Diagnosis present

## 2021-09-26 DIAGNOSIS — F17210 Nicotine dependence, cigarettes, uncomplicated: Secondary | ICD-10-CM | POA: Diagnosis present

## 2021-09-26 DIAGNOSIS — R300 Dysuria: Secondary | ICD-10-CM | POA: Diagnosis present

## 2021-09-26 DIAGNOSIS — R0989 Other specified symptoms and signs involving the circulatory and respiratory systems: Secondary | ICD-10-CM | POA: Diagnosis not present

## 2021-09-26 DIAGNOSIS — R64 Cachexia: Secondary | ICD-10-CM | POA: Diagnosis present

## 2021-09-26 DIAGNOSIS — K209 Esophagitis, unspecified without bleeding: Secondary | ICD-10-CM | POA: Diagnosis present

## 2021-09-26 DIAGNOSIS — K254 Chronic or unspecified gastric ulcer with hemorrhage: Secondary | ICD-10-CM | POA: Diagnosis present

## 2021-09-26 DIAGNOSIS — F329 Major depressive disorder, single episode, unspecified: Secondary | ICD-10-CM | POA: Diagnosis present

## 2021-09-26 DIAGNOSIS — E43 Unspecified severe protein-calorie malnutrition: Secondary | ICD-10-CM

## 2021-09-26 DIAGNOSIS — Z90411 Acquired partial absence of pancreas: Secondary | ICD-10-CM | POA: Diagnosis not present

## 2021-09-26 DIAGNOSIS — D72829 Elevated white blood cell count, unspecified: Secondary | ICD-10-CM | POA: Diagnosis not present

## 2021-09-26 DIAGNOSIS — Z825 Family history of asthma and other chronic lower respiratory diseases: Secondary | ICD-10-CM | POA: Diagnosis not present

## 2021-09-26 HISTORY — PX: ESOPHAGOGASTRODUODENOSCOPY (EGD) WITH PROPOFOL: SHX5813

## 2021-09-26 HISTORY — PX: BIOPSY: SHX5522

## 2021-09-26 LAB — BASIC METABOLIC PANEL
Anion gap: 5 (ref 5–15)
BUN: 9 mg/dL (ref 6–20)
CO2: 25 mmol/L (ref 22–32)
Calcium: 7.6 mg/dL — ABNORMAL LOW (ref 8.9–10.3)
Chloride: 101 mmol/L (ref 98–111)
Creatinine, Ser: 0.56 mg/dL — ABNORMAL LOW (ref 0.61–1.24)
GFR, Estimated: >60 mL/min (ref >60)
Glucose, Bld: 96 mg/dL (ref 70–99)
Potassium: 3.4 mmol/L — ABNORMAL LOW (ref 3.5–5.1)
Sodium: 131 mmol/L — ABNORMAL LOW (ref 135–145)

## 2021-09-26 LAB — HIV ANTIBODY (ROUTINE TESTING W REFLEX): HIV Screen 4th Generation wRfx: NONREACTIVE

## 2021-09-26 LAB — HEMOGLOBIN AND HEMATOCRIT, BLOOD
HCT: 25.6 % — ABNORMAL LOW (ref 39.0–52.0)
Hemoglobin: 8.7 g/dL — ABNORMAL LOW (ref 13.0–17.0)

## 2021-09-26 LAB — URINE CULTURE

## 2021-09-26 SURGERY — ESOPHAGOGASTRODUODENOSCOPY (EGD) WITH PROPOFOL
Anesthesia: Monitor Anesthesia Care

## 2021-09-26 MED ORDER — PHENYLEPHRINE 40 MCG/ML (10ML) SYRINGE FOR IV PUSH (FOR BLOOD PRESSURE SUPPORT): PREFILLED_SYRINGE | INTRAVENOUS | Status: DC | PRN

## 2021-09-26 MED ORDER — ADULT MULTIVITAMIN W/MINERALS CH
1.0000 | ORAL_TABLET | Freq: Every day | ORAL | Status: DC
  Administered 2021-09-26 – 2021-09-28 (×3): 1 via ORAL
  Filled 2021-09-26 (×3): qty 1

## 2021-09-26 MED ORDER — SUCRALFATE 1 GM/10ML PO SUSP
1.0000 g | Freq: Three times a day (TID) | ORAL | Status: DC
  Administered 2021-09-26 – 2021-09-28 (×6): 1 g via ORAL
  Filled 2021-09-26 (×6): qty 10

## 2021-09-26 MED ORDER — ACETAMINOPHEN 325 MG PO TABS
650.0000 mg | ORAL_TABLET | Freq: Four times a day (QID) | ORAL | Status: DC | PRN
  Administered 2021-09-26: 22:00:00 650 mg via ORAL
  Filled 2021-09-26: qty 2

## 2021-09-26 MED ORDER — SODIUM CHLORIDE 0.9 % IV SOLN
25.0000 mg | Freq: Once | INTRAVENOUS | Status: DC
  Filled 2021-09-26: qty 0.5

## 2021-09-26 MED ORDER — HYDROMORPHONE HCL 1 MG/ML IJ SOLN
0.5000 mg | Freq: Once | INTRAMUSCULAR | Status: AC
  Administered 2021-09-26: 02:00:00 0.5 mg via INTRAVENOUS
  Filled 2021-09-26: qty 0.5

## 2021-09-26 MED ORDER — LIDOCAINE 2% (20 MG/ML) 5 ML SYRINGE
INTRAMUSCULAR | Status: DC | PRN
Start: 2021-09-26 — End: 2021-09-26
  Administered 2021-09-26: 40 mg via INTRAVENOUS

## 2021-09-26 MED ORDER — PROPOFOL 10 MG/ML IV BOLUS
INTRAVENOUS | Status: DC | PRN
  Administered 2021-09-26: 25 mg via INTRAVENOUS
  Administered 2021-09-26: 30 mg via INTRAVENOUS

## 2021-09-26 MED ORDER — PANCRELIPASE (LIP-PROT-AMYL) 36000-114000 UNITS PO CPEP
36000.0000 [IU] | ORAL_CAPSULE | Freq: Three times a day (TID) | ORAL | Status: DC
  Administered 2021-09-27 – 2021-09-28 (×4): 36000 [IU] via ORAL
  Filled 2021-09-26 (×4): qty 1

## 2021-09-26 MED ORDER — PANTOPRAZOLE SODIUM 40 MG PO TBEC
40.0000 mg | DELAYED_RELEASE_TABLET | Freq: Two times a day (BID) | ORAL | Status: DC
  Administered 2021-09-26 – 2021-09-28 (×4): 40 mg via ORAL
  Filled 2021-09-26 (×4): qty 1

## 2021-09-26 MED ORDER — SODIUM CHLORIDE 0.9 % IV SOLN: 1000.0000 mg | Freq: Once | INTRAVENOUS | Status: DC

## 2021-09-26 MED ORDER — FERROUS SULFATE 325 (65 FE) MG PO TABS: 325.0000 mg | ORAL_TABLET | Freq: Every day | ORAL | Status: DC

## 2021-09-26 MED ORDER — PANCRELIPASE (LIP-PROT-AMYL) 12000-38000 UNITS PO CPEP
12000.0000 [IU] | ORAL_CAPSULE | Freq: Two times a day (BID) | ORAL | Status: DC | PRN
  Filled 2021-09-26 (×2): qty 1

## 2021-09-26 MED ORDER — LACTATED RINGERS IV SOLN: INTRAVENOUS | Status: AC

## 2021-09-26 MED ORDER — SODIUM CHLORIDE 0.9 % IV SOLN
1000.0000 mg | Freq: Once | INTRAVENOUS | Status: DC
  Filled 2021-09-26: qty 20

## 2021-09-26 MED ORDER — EPHEDRINE SULFATE-NACL 50-0.9 MG/10ML-% IV SOSY
PREFILLED_SYRINGE | INTRAVENOUS | Status: DC | PRN
  Administered 2021-09-26: 10 mg via INTRAVENOUS

## 2021-09-26 MED ORDER — PHENYLEPHRINE HCL-NACL 20-0.9 MG/250ML-% IV SOLN
INTRAVENOUS | Status: DC | PRN
  Administered 2021-09-26: 25 ug/min via INTRAVENOUS

## 2021-09-26 MED ORDER — PROPOFOL 500 MG/50ML IV EMUL
INTRAVENOUS | Status: DC | PRN
  Administered 2021-09-26: 50 ug/kg/min via INTRAVENOUS

## 2021-09-26 MED ORDER — ACETAMINOPHEN 650 MG RE SUPP: 650.0000 mg | Freq: Four times a day (QID) | RECTAL | Status: DC | PRN

## 2021-09-26 MED ORDER — FERROUS SULFATE 325 (65 FE) MG PO TABS
325.0000 mg | ORAL_TABLET | ORAL | Status: DC
  Administered 2021-09-28: 325 mg via ORAL
  Filled 2021-09-26: qty 1

## 2021-09-26 MED ORDER — LACTATED RINGERS IV SOLN: INTRAVENOUS | Status: DC | PRN

## 2021-09-26 MED ORDER — SODIUM CHLORIDE 0.9 % IV SOLN
500.0000 mg | Freq: Once | INTRAVENOUS | Status: AC
  Administered 2021-09-26: 16:00:00 500 mg via INTRAVENOUS
  Filled 2021-09-26: qty 25

## 2021-09-26 MED ORDER — TRAMADOL HCL 50 MG PO TABS
50.0000 mg | ORAL_TABLET | Freq: Once | ORAL | Status: AC
  Administered 2021-09-27: 50 mg via ORAL
  Filled 2021-09-26: qty 1

## 2021-09-26 MED ORDER — ACETAMINOPHEN 325 MG PO TABS
650.0000 mg | ORAL_TABLET | Freq: Once | ORAL | Status: AC
  Filled 2021-09-26: qty 2

## 2021-09-26 SURGICAL SUPPLY — 15 items
BLOCK BITE 60FR ADLT L/F BLUE (MISCELLANEOUS) ×4 IMPLANT
ELECT REM PT RETURN 9FT ADLT (ELECTROSURGICAL) IMPLANT
ELECTRODE REM PT RTRN 9FT ADLT (ELECTROSURGICAL) IMPLANT
FORCEP RJ3 GP 1.8X160 W-NEEDLE (CUTTING FORCEPS) IMPLANT
FORCEPS BIOP RAD 4 LRG CAP 4 (CUTTING FORCEPS) IMPLANT
NDL SCLEROTHERAPY 25GX240 (NEEDLE) IMPLANT
NEEDLE SCLEROTHERAPY 25GX240 (NEEDLE) IMPLANT
PROBE APC STR FIRE (PROBE) IMPLANT
PROBE INJECTION GOLD (MISCELLANEOUS)
PROBE INJECTION GOLD 7FR (MISCELLANEOUS) IMPLANT
SNARE SHORT THROW 13M SML OVAL (MISCELLANEOUS) IMPLANT
SYR 50ML LL SCALE MARK (SYRINGE) IMPLANT
TUBING ENDO SMARTCAP PENTAX (MISCELLANEOUS) ×8 IMPLANT
TUBING IRRIGATION ENDOGATOR (MISCELLANEOUS) ×4 IMPLANT
WATER STERILE IRR 1000ML POUR (IV SOLUTION) IMPLANT

## 2021-09-26 NOTE — Progress Notes (Addendum)
Initial Nutrition Assessment  DOCUMENTATION CODES:   Underweight, Severe malnutrition in context of chronic illness  INTERVENTION:   Advance diet as medically able per MD Encourage good PO intake  Multivitamin w/ minerals daily When diet is advanced provide Boost Breeze po TID, each supplement provides 250 kcal and 9 grams of protein  NUTRITION DIAGNOSIS:   Severe Malnutrition related to chronic illness as evidenced by severe muscle depletion, severe fat depletion.  GOAL:   Patient will meet greater than or equal to 90% of their needs  MONITOR:   Weight trends, Diet advancement, Labs, Skin  REASON FOR ASSESSMENT:   Consult Assessment of nutrition requirement/status, Poor PO  ASSESSMENT:   55 y.o. male presented to the ED with abdominal pain and dark stools for 1 week. PMH includes chronic pancreatitis s/p Whipple procedure, HTN, and GERD. Pt admitted with a possible GI bleed.   Currently NPO for EGD today.   Pt reports that this is normal for him, he will be able to eat good for ~3 months can eat things such as pizza and hamburgers. Pt explains it as, one day it just hits him and he cannot eat like that any more and it increasingly gets worse until he ends up in the hospital for fluids. Pt endorses nausea and diarrhea all the time and vomiting often. Pt reports that when he starts to have a flare up he will eat boiled chicken with pasta, no sauce or [place] burgers. Pt reports that he thinks that he only has to take his pancreatic enzymes 1 per day. Pt reports that this has all been going on since he had his Whipple procedure done back in 2015.   Pt unable to provide UBW, states that he is always losing weight. When asked if he puts on any weight when he is feeling and eating better, pt reports no. Per EMR, pt has had a 6% weight loss within 6 months, this is not significant for time frame.   Discussed ONS with pt, pt does not like milk products and would not like Ensure.  Discussed additional clear liquid supplements, pt states he has never had them. RD will provide Boost Breeze when pt diet is advanced.   Pt with no other questions or concerns at this time.   Medications reviewed and include: Creon, IV iron Labs reviewed:  - Sodium 131  - Potassium 3.4 - Calcium 7.6  NUTRITION - FOCUSED PHYSICAL EXAM:  Flowsheet Row Most Recent Value  Orbital Region Moderate depletion  Upper Arm Region Severe depletion  Thoracic and Lumbar Region Severe depletion  Buccal Region Moderate depletion  Temple Region Moderate depletion  Clavicle Bone Region Severe depletion  Clavicle and Acromion Bone Region Severe depletion  Scapular Bone Region Severe depletion  Dorsal Hand Mild depletion  Patellar Region Severe depletion  Anterior Thigh Region Severe depletion  Posterior Calf Region Severe depletion  Edema (RD Assessment) None  Hair Reviewed  Eyes Reviewed  Mouth Reviewed  Skin Reviewed  Nails Reviewed       Diet Order:   Diet Order             Diet NPO time specified Except for: Sips with Meds  Diet effective midnight                   EDUCATION NEEDS:   No education needs have been identified at this time  Skin:  Skin Assessment: Skin Integrity Issues: Skin Integrity Issues:: Stage I Stage I: Coccyx  Last BM:  09/23/2021  Height:   Ht Readings from Last 1 Encounters:  09/26/21 6\' 4"  (1.93 m)    Weight:   Wt Readings from Last 1 Encounters:  09/26/21 56.1 kg    Ideal Body Weight:  91.8 kg  BMI:  Body mass index is 15.05 kg/m.  Estimated Nutritional Needs:   Kcal:  1800-2000  Protein:  85-100 grams  Fluid:  >/= 1.8 L    Lisandro Meggett Louie Casa, RD, LDN Clinical Dietitian See [facility] for contact information.

## 2021-09-26 NOTE — Progress Notes (Addendum)
HD#0 SUBJECTIVE:  Patient Summary: Brent Rogers is a 55 y.o. with a pertinent PMH of  HTN, chronic pancreatitis on Creo s/p whipple with chronic abd pain presents with complaints of lower abdominal pain and dark stools for the past 1 week and admitted for suspected GI bleed.   Overnight Events: no acute events overnight    Interm History: Patient evaluated at bedside. He reports feeling "rough" with ongoing abd pain. Patient reports that he is feeling dizzy. Still feels dehydrated, asking when he will be able to eat. Still having abdominal pain as well, feels the same as on admission. No other complaints at this time.  OBJECTIVE:  Vital Signs: Vitals:   09/25/21 2300 09/26/21 0004 09/26/21 0654 09/26/21 0759  BP: 109/78  104/69 (!) 86/63  Pulse: 63  68 60  Resp: 17  16   Temp: 98 F (36.7 C)  97.7 F (36.5 C) 97.7 F (36.5 C)  TempSrc: Oral  Oral Oral  SpO2: 100%  100% 99%  Weight:  56.1 kg    Height:  6\' 4"  (1.93 m)     Supplemental O2: Room Air SpO2: 99 %  Filed Weights   09/26/21 0004  Weight: 56.1 kg     Intake/Output Summary (Last 24 hours) at 09/26/2021 1112 Last data filed at 09/26/2021 0935 Gross per 24 hour  Intake 2000 ml  Output 375 ml  Net 1625 ml   Net IO Since Admission: 1,625 mL [09/26/21 1112]  Physical Exam: Constitutional: cachectic, chronically ill appearing, in no acute distress HENT: normocephalic atraumatic, dry mucus membranes Eyes: conjunctiva non-erythematous Neck: supple Cardiovascular: regular rate and rhythm, no m/r/g, bilateral pedal edema Pulmonary/Chest: normal work of breathing on room air, lungs clear to auscultation bilaterally Abdominal: soft, tender, non-distended, midline surgical scar MSK: decreased muscle bulk, normal tone Neurological: alert & oriented x 3, 5/5 strength in bilateral upper and lower extremities, normal gait Skin: warm and dry, poor skin turgor,  Psych: mood and affect appropriate  Patient  Lines/Drains/Airways Status     Active Line/Drains/Airways     Name Placement date Placement time Site Days   Peripheral IV 09/25/21 20 G Anterior;Left;Upper Arm 09/25/21  --  Arm  1   Pressure Injury 09/25/21 Coccyx Medial Stage 1 -  Intact skin with non-blanchable redness of a localized area usually over a bony prominence. healed pressure ulcer 09/25/21  2320  -- 1            Pertinent Labs: CBC Latest Ref Rng & Units 09/26/2021 09/25/2021 09/25/2021  WBC 4.0 - 10.5 K/uL - - 10.0  Hemoglobin 13.0 - 17.0 g/dL 8.7(L) 10.4(L) 10.7(L)  Hematocrit 39.0 - 52.0 % 25.6(L) 31.5(L) 32.7(L)  Platelets 150 - 400 K/uL - - 305    CMP Latest Ref Rng & Units 09/26/2021 09/25/2021 09/09/2021  Glucose 70 - 99 mg/dL 96 121(H) 97  BUN 6 - 20 mg/dL 9 8 5(L)  Creatinine 0.61 - 1.24 mg/dL 0.56(L) 0.76 0.67  Sodium 135 - 145 mmol/L 131(L) 131(L) 139  Potassium 3.5 - 5.1 mmol/L 3.4(L) 3.6 3.4(L)  Chloride 98 - 111 mmol/L 101 96(L) 105  CO2 22 - 32 mmol/L 25 24 27   Calcium 8.9 - 10.3 mg/dL 7.6(L) 8.4(L) 8.3(L)  Total Protein 6.5 - 8.1 g/dL - 5.3(L) 6.0(L)  Total Bilirubin 0.3 - 1.2 mg/dL - 0.7 0.3  Alkaline Phos 38 - 126 U/L - 164(H) 163(H)  AST 15 - 41 U/L - 19 51(H)  ALT 0 -  44 U/L - 13 45(H)    No results for input(s): GLUCAP in the last 72 hours.   Pertinent Imaging: CT ABDOMEN PELVIS W CONTRAST  Result Date: 09/25/2021 CLINICAL DATA:  Left lower quadrant abdominal pain. EXAM: CT ABDOMEN AND PELVIS WITH CONTRAST TECHNIQUE: Multidetector CT imaging of the abdomen and pelvis was performed using the standard protocol following bolus administration of intravenous contrast. CONTRAST:  158mL OMNIPAQUE IOHEXOL 300 MG/ML  SOLN COMPARISON:  August 13, 2021 FINDINGS: Lower chest: No acute abnormality. Hepatobiliary: No suspicious hepatic lesion. Status post cholecystectomy and hepaticojejunostomy with postoperative pneumobilia. Pancreas: Status post Whipple pancreaticoduodenectomy. No pancreatic  ductal dilation or evidence of acute inflammation. Spleen: Normal in size without focal abnormality. Adrenals/Urinary Tract: Bilateral adrenal glands are unremarkable. No hydronephrosis. Bilateral renal cysts. No solid enhancing renal mass. Urinary bladder is unremarkable for degree of distension. Stomach/Bowel: Status post distal gastrectomy and gastrojejunostomy. No pathologic dilation of large or small bowel. Visualized portions of the appendix appear normal additionally there is no pericecal inflammation. No evidence of acute bowel inflammation. Vascular/Lymphatic: Aortic and branch vessel atherosclerosis without abdominal aortic aneurysm. No pathologically enlarged abdominal or pelvic lymph nodes. Reproductive: Prostate is unremarkable. Other: No significant abdominopelvic free fluid. No pneumoperitoneum. Musculoskeletal: Thoracolumbar spondylosis. No acute osseous abnormality. IMPRESSION: 1. No acute findings in the abdomen or pelvis. 2. Status post Whipple pancreaticoduodenectomy. 3.  Aortic Atherosclerosis (ICD10-I70.0). Electronically Signed   By: Dahlia Bailiff M.D.   On: 09/25/2021 17:34   VAS [place] ABI WITH/WO TBI  Result Date: 09/26/2021  LOWER EXTREMITY DOPPLER STUDY Patient Name:  Brent Rogers  Date of Exam:   09/26/2021 Medical Rec #: [id number]         Accession #:    [id number] Date of Birth: [birth year]          Patient Gender: M Patient Age:   80 years Exam Location:  [facility] Procedure:      VAS [place] ABI WITH/WO TBI Referring Phys: [name] Lemming --------------------------------------------------------------------------------  Indications: Unable to palpate/doppler pulses. High Risk Factors: Current smoker, coronary artery disease.  Comparison Study: 05-08-2016 ABI w/ TBI was 1.19/0.7 on the RT and 1.2/0.6 on                   the LT. Performing Technologist: Darlin Coco RDMS RVT  Examination Guidelines: A complete evaluation includes at minimum, Doppler waveform signals and systolic  blood pressure reading at the level of bilateral brachial, anterior tibial, and posterior tibial arteries, when vessel segments are accessible. Bilateral testing is considered an integral part of a complete examination. Photoelectric Plethysmograph (PPG) waveforms and toe systolic pressure readings are included as required and additional duplex testing as needed. Limited examinations for reoccurring indications may be performed as noted.  ABI Findings: +--------+------------------+-----+---------+--------+  Right    Rt Pressure (mmHg) Index Waveform  Comment   +--------+------------------+-----+---------+--------+  Brachial 93                       triphasic           +--------+------------------+-----+---------+--------+  PTA      113                1.22  triphasic           +--------+------------------+-----+---------+--------+  DP       113                1.22  triphasic           +--------+------------------+-----+---------+--------+ +--------+------------------+-----+---------+-------+  Left     Lt Pressure (mmHg) Index Waveform  Comment  +--------+------------------+-----+---------+-------+  Brachial 90                       triphasic          +--------+------------------+-----+---------+-------+  PTA      98                 1.05  triphasic          +--------+------------------+-----+---------+-------+  DP       106                1.14  triphasic          +--------+------------------+-----+---------+-------+ +-------+-----------+-----------+------------+------------+  ABI/TBI Today's ABI Today's TBI Previous ABI Previous TBI  +-------+-----------+-----------+------------+------------+  Right   1.22                    1.19         0.70          +-------+-----------+-----------+------------+------------+  Left    1.14                    1.20         0.60          +-------+-----------+-----------+------------+------------+ Right ABIs appear essentially unchanged compared to prior study on 05-08-2016. Left ABIs  appear essentially unchanged compared to prior study on 05-08-2016.  Summary: Right: Resting right ankle-brachial index is within normal range. No evidence of significant right lower extremity arterial disease. Left: Resting left ankle-brachial index is within normal range. No evidence of significant left lower extremity arterial disease.  *See table(s) above for measurements and observations.     Preliminary     ASSESSMENT/PLAN:  Assessment: Principal Problem:   GI bleed   Brent Rogers is a 55 y.o. with pertinent hx of HTN, chronic pancreatitis on Creon s/p whipple with chronic abd pain presents with complaints of lower abdominal pain and dark stools for the past 1 week on hospital day 0  Plan: # melena # abd pain - reports dark tarry stools. FOBT positive. EGD 02/2021 showing friable jejunal mucosa, suspect this to be source of bleed. Has not had any more dark tarry stools since admission.  - hgb 8.7 today, down from 10. Received IVFs, likely dilutional. Will continue to monitor and transfuse if it drops below 7. - Planned for EGD later this afternoon, will f/u GI recs.   #weightloss # chronic pancreatitis with pancreatic insufficiency  # s/p cholecystectomy # Nausea, vomiting, diarrhea - patient reporting approximately 30 pound weight loss over last 6 months. Suspect this may be related to pancreatitis insufficiency. Likely exacerbated by recent nausea and vomiting, creon may not be able to take effect. Poor PO intake overall also impacting weight gain.  - He has been having diarrhea even on previous dose of Creon, we have increased this in the hopes that he will be able to better absorb nutrients. - patient albumin 2.3, he has some LEE in feet bilaterally - will consult nutrition  - can consider SLP eval if continued problems swallowing. -  IVFs for dehydration. Suspect hyponatremia to be hypovolemic.  - zofran   #diminished pedal pulses - Lower extremities cool. Unable to  palpate pedal pulses. Unable to find pulses with doppler. - ABIs obtained and were within normal limits.    # iron deficiency anemia - restarted home iron supplementation. Iron deficiency anemia is a  possible complication of Whipple procedure. Despite his oral supplementation, he is likely not absorbing it well, will give IV iron as well.  - monitor and transfuse as necessary.   # HTN - monitor.   # MDD  Best Practice: Diet:  CLD, NPO at midnight VTE: SCDs IVF: LR,Bolus Code: Full Prior to Admission Living Arrangement: Home, living   Anticipated Discharge Location: Home Barriers to Discharge: medical stability  Signature: Delene Ruffini, MD  Internal Medicine Resident, PGY-1 [facility]  Pager: [phone] 11:12 AM, 09/26/2021   Please contact the on call pager after 5 pm and on weekends at [phone].

## 2021-09-26 NOTE — Anesthesia Postprocedure Evaluation (Signed)
Anesthesia Post Note  Patient: RAED SCHALK  Procedure(s) Performed: ESOPHAGOGASTRODUODENOSCOPY (EGD) WITH PROPOFOL BIOPSY     Patient location during evaluation: Endoscopy Anesthesia Type: MAC Level of consciousness: awake and alert Pain management: pain level controlled Vital Signs Assessment: post-procedure vital signs reviewed and stable Respiratory status: spontaneous breathing, nonlabored ventilation, respiratory function stable and patient connected to nasal cannula oxygen Cardiovascular status: stable and blood pressure returned to baseline Postop Assessment: no apparent nausea or vomiting Anesthetic complications: no   No notable events documented.  Last Vitals:  Vitals:   09/26/21 1513 09/26/21 1528  BP: 97/75 103/79  Pulse: 70 75  Resp: 13 13  Temp:  37.1 C  SpO2: 98% 95%    Last Pain:  Vitals:   09/26/21 1528  TempSrc: Oral  PainSc: 0-No pain                 Catalina Gravel

## 2021-09-26 NOTE — Progress Notes (Signed)
ABI study completed.   Please see CV Proc for preliminary results.   Darlin Coco, RDMS, RVT

## 2021-09-26 NOTE — Anesthesia Preprocedure Evaluation (Addendum)
Anesthesia Evaluation  Patient identified by MRN, date of birth, ID band Patient awake    Reviewed: Allergy & Precautions, NPO status , Patient's Chart, lab work & pertinent test results  Airway Mallampati: II  TM Distance: >3 FB Neck ROM: Full    Dental  (+) Dental Advisory Given, Poor Dentition, Chipped, Missing   Pulmonary Current Smoker and Patient abstained from smoking.,    Pulmonary exam normal breath sounds clear to auscultation       Cardiovascular hypertension, Pt. on medications (-) angina+ CAD  (-) Past MI Normal cardiovascular exam Rhythm:Regular Rate:Normal  02/2021 ECHO: EF 60 to 65%. The LV has normal function, no regional wall motion abnormalities. Left ventricular diastolic parameters were  normal. Right ventricular systolic function is normal, trivial MR   Neuro/Psych  Headaches, PSYCHIATRIC DISORDERS Anxiety Depression    GI/Hepatic GERD  Medicated,S/p whipple  Mass around pancreas, chronic pancreatitis   Endo/Other    Renal/GU      Musculoskeletal   Abdominal   Peds  Hematology  (+) Blood dyscrasia (Hb 8.7), anemia ,   Anesthesia Other Findings   Reproductive/Obstetrics                            Anesthesia Physical Anesthesia Plan  ASA: 3  Anesthesia Plan: MAC   Post-op Pain Management: Minimal or no pain anticipated   Induction:   PONV Risk Score and Plan: 0 and Propofol infusion and TIVA  Airway Management Planned: Natural Airway and Nasal Cannula  Additional Equipment: None  Intra-op Plan:   Post-operative Plan:   Informed Consent: I have reviewed the patients History and Physical, chart, labs and discussed the procedure including the risks, benefits and alternatives for the proposed anesthesia with the patient or authorized representative who has indicated his/her understanding and acceptance.     Dental advisory given  Plan Discussed with:  CRNA  Anesthesia Plan Comments:        Anesthesia Quick Evaluation

## 2021-09-26 NOTE — Transfer of Care (Signed)
Immediate Anesthesia Transfer of Care Note  Patient: CLARE CASTO  Procedure(s) Performed: ESOPHAGOGASTRODUODENOSCOPY (EGD) WITH PROPOFOL BIOPSY  Patient Location: Endoscopy Unit  Anesthesia Type:MAC  Level of Consciousness: drowsy  Airway & Oxygen Therapy: Patient Spontanous Breathing and Patient connected to nasal cannula oxygen  Post-op Assessment: Report given to RN and Post -op Vital signs reviewed and stable  Post vital signs: Reviewed and stable  Last Vitals:  Vitals Value Taken Time  BP 138/86 09/26/21 1503  Temp    Pulse 83 09/26/21 1507  Resp 14 09/26/21 1507  SpO2 96 % 09/26/21 1507  Vitals shown include unvalidated device data.  Last Pain:  Vitals:   09/26/21 1320  TempSrc: Temporal  PainSc: 8       Patients Stated Pain Goal: 0 (84/66/59 [id number])  Complications: No notable events documented.

## 2021-09-26 NOTE — Plan of Care (Signed)
°  Problem: Education: Goal: Knowledge of General Education information will improve Description: Including pain rating scale, medication(s)/side effects and non-pharmacologic comfort measures Outcome: Progressing   Problem: Activity: Goal: Risk for activity intolerance will decrease Outcome: Progressing   Problem: Elimination: Goal: Will not experience complications related to urinary retention Outcome: Progressing   Problem: Nutrition: Goal: Adequate nutrition will be maintained Outcome: Not Progressing   Problem: Pain Managment: Goal: General experience of comfort will improve Outcome: Not Progressing

## 2021-09-26 NOTE — Progress Notes (Signed)
°  Transition of Care [facility]) Screening Note   Patient Details  Name: Brent Rogers Date of Birth: [birth year]   Transition of Care [facility]) CM/SW Contact:    Cyndi Bender, RN Phone Number: 09/26/2021, 8:45 AM    Transition of Care Department [facility]) has reviewed patient and no TOC needs have been identified at this time. We will continue to monitor patient advancement through interdisciplinary progression rounds. If new patient transition needs arise, please place a TOC consult.

## 2021-09-26 NOTE — Plan of Care (Signed)

## 2021-09-26 NOTE — Anesthesia Procedure Notes (Signed)
Procedure Name: MAC Date/Time: 09/26/2021 2:38 PM Performed by: Reece Agar, CRNA Pre-anesthesia Checklist: Patient identified, Emergency Drugs available, Suction available and Patient being monitored Patient Re-evaluated:Patient Re-evaluated prior to induction Oxygen Delivery Method: Nasal cannula

## 2021-09-26 NOTE — H&P (View-Only) (Signed)
Consultation  Referring Provider:   [facility] med service Primary Care Physician:  Ladell Pier, MD Primary Gastroenterologist:  none/unassigned  Reason for Consultation: Melena  HPI: Brent Rogers is a 55 y.o. male, who was admitted through the emergency room last evening with complaints of abdominal pain and black stools.  Patient says that he started having nausea and vomiting about 8 days ago and then 7 days ago noted dark jet black stools which have persisted since.  He has not had any coffee-ground emesis.  He did have some abdominal discomfort but says that this feels different than flares of his chronic pancreatitis as he was having pain primarily in his left flank and then left lower abdomen which seem to be exacerbated by urination.  He was wondering whether he had a kidney stone.  He has been unable to urinary very much over the past couple of days. He has maintained Protonix 40 mg once daily chronically, denies any aspirin or NSAID use.  On Creon at home. Patient says he has had frequent episodes of abdominal pain nausea and vomiting at home over the past 6 months and goes through periods of time where he is unable to eat or hold anything down.  He has lost about 30 pounds over the past 6 months.  He did undergo EGD and colonoscopy in June 2022 per Dr. Benson Norway when he was admitted with anemia and iron deficiency.  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  On admission hemoglobin 10.7/hematocrit 32.7 down to 8.7 this a.m. Lipase 18, LFTs normal with exception of alk phos at 164  BUN 8/creatinine 0.76  CT of the abdomen pelvis on admission no hydronephrosis, bilateral renal cysts urinary bladder unremarkable for degree of distention, status post gastrectomy and  gastrojejunostomy, no evidence of bowel inflammation, positive aortic and branch vessel atherosclerosis.  Patient has known history of chronic pancreatitis and is status post Whipple procedure done in 2015 at [facility] due to chronic abdominal pain secondary to chronic pancreatitis.  He has been hemodynamically stable since admission, no further melena overnight.    Past Medical History:  Diagnosis Date   Benign tumor of endocrine pancreas    Chronic pancreatitis ([place])    S/P Whipple   Foot ulcer with fat layer exposed ([place])    Hypertension    Migraine    "@ least once/month" (11/12/2015)   Pancreatic abnormality    CT  shows mass   Pneumonia 11/12/2015   Scoliosis     Past Surgical History:  Procedure Laterality Date   BACK SURGERY     COLONOSCOPY WITH PROPOFOL N/A 03/11/2021   Procedure: COLONOSCOPY WITH PROPOFOL;  Surgeon: Carol Ada, MD;  Location: [place];  Service: Endoscopy;  Laterality: N/A;   ESOPHAGOGASTRODUODENOSCOPY (EGD) WITH PROPOFOL N/A 03/11/2021   Procedure: ESOPHAGOGASTRODUODENOSCOPY (EGD) WITH PROPOFOL;  Surgeon: Carol Ada, MD;  Location: [place];  Service: Endoscopy;  Laterality: N/A;   EUS N/A 09/21/2013   Procedure: ESOPHAGEAL ENDOSCOPIC ULTRASOUND (EUS) RADIAL;  Surgeon: Beryle Beams, MD;  Location: [facility];  Service: Endoscopy;  Laterality: N/A;   EUS N/A 09/30/2013   Procedure: UPPER ENDOSCOPIC ULTRASOUND (EUS) LINEAR;  Surgeon: Beryle Beams, MD;  Location: [facility];  Service: Endoscopy;  Laterality: N/A;   FINE NEEDLE ASPIRATION N/A 09/21/2013  Procedure: FINE NEEDLE ASPIRATION (FNA) LINEAR;  Surgeon: Patrick D Hung, MD;  Location: [facility];  Service: Endoscopy;  Laterality: N/A;  ° FINGER FRACTURE SURGERY Left 1995  ° 5th digit  ° FRACTURE SURGERY    ° HEMOSTASIS CLIP PLACEMENT  03/11/2021  ° Procedure: HEMOSTASIS CLIP PLACEMENT;  Surgeon: Hung, Patrick, MD;  Location: [facility];  Service: Endoscopy;;  ° HEMOSTASIS  CONTROL  03/11/2021  ° Procedure: HEMOSTASIS CONTROL;  Surgeon: Hung, Patrick, MD;  Location: [facility];  Service: Endoscopy;;  ° KNEE ARTHROSCOPY Bilateral 1987-1989  ° right-left  ° LAPAROSCOPY N/A 12/01/2013  ° Procedure: LAPAROSCOPY DIAGNOSTIC PANCREATICODUODENECTOMY WITH BILIARY AND PANCREATIC STENTS;  Surgeon: Faera Byerly, MD;  Location: [facility];  Service: General;  Laterality: N/A;  ° LUMBAR DISC SURGERY  1990's  ° SAVORY DILATION N/A 03/11/2021  ° Procedure: SAVORY DILATION;  Surgeon: Hung, Patrick, MD;  Location: [facility];  Service: Endoscopy;  Laterality: N/A;  ° WHIPPLE PROCEDURE  12/01/2013  ° ° °Prior to Admission medications   °Medication Sig Start Date End Date Taking? Authorizing Provider  °amLODipine (NORVASC) 5 MG tablet Take 1 tablet (5 mg total) by mouth daily. 03/21/21  Yes Johnson, Deborah B, MD  °aspirin 325 MG tablet Take 325 mg by mouth daily as needed for moderate pain.   Yes [provider]  °ferrous sulfate 325 (65 FE) MG tablet Take 1 tablet (325 mg total) by mouth daily. °Patient taking differently: Take 325 mg by mouth every other day. 03/16/21  Yes Johnson, Deborah B, MD  °ondansetron (ZOFRAN-ODT) 8 MG disintegrating tablet Take 1 tablet (8 mg total) by mouth every 8 (eight) hours as needed. °Patient taking differently: Take 8 mg by mouth every 8 (eight) hours as needed for nausea or vomiting. 09/09/21  Yes Wickline, Donald, MD  °Pancrelipase, Lip-Prot-Amyl, (CREON) 24000-76000 units CPEP Take 1 capsule (24,000 Units total) by mouth with breakfast, with lunch, and with evening meal. °Patient taking differently: Take 1 capsule by mouth daily. As per patient he eats usually once a day 03/16/21  Yes Johnson, Deborah B, MD  °promethazine (PHENERGAN) 25 MG tablet Take 1 tablet (25 mg total) by mouth every 6 (six) hours as needed for nausea or vomiting. 09/09/21  Yes Wickline, Donald, MD  °famotidine (PEPCID) 20 MG tablet Take 1 tablet (20 mg total) by mouth 2 (two) times  daily. °Patient not taking: Reported on 09/25/2021 09/06/21 10/06/21  Ji, Andrew, MD  °mirtazapine (REMERON) 15 MG tablet Take 1 tablet (15 mg total) by mouth at bedtime. °Patient not taking: Reported on 09/09/2021 09/06/21 10/06/21  Ji, Andrew, MD  °pantoprazole (PROTONIX) 40 MG tablet Take 1 tablet (40 mg total) by mouth daily. °Patient not taking: Reported on 09/25/2021 03/16/21   Johnson, Deborah B, MD  °sucralfate (CARAFATE) 1 GM/10ML suspension Take 10 mLs (1 g total) by mouth 4 (four) times daily -  with meals and at bedtime. °Patient not taking: Reported on 09/09/2021 09/06/21 10/06/21  Ji, Andrew, MD  °venlafaxine XR (EFFEXOR-XR) 75 MG 24 hr capsule Take 1 capsule (75 mg total) by mouth daily with breakfast. °Patient not taking: Reported on 09/09/2021 09/06/21 10/06/21  Ji, Andrew, MD  ° ° °Current Facility-Administered Medications  °Medication Dose Route Frequency Provider Last Rate Last Admin  ° acetaminophen (TYLENOL) tablet 650 mg  650 mg Oral Q6H PRN Aslam, Sadia, MD   650 mg at 09/26/21 0433  ° Or  ° acetaminophen (TYLENOL) suppository 650 mg  650 mg Rectal Q6H PRN Aslam, Sadia,   MD      ° lactated ringers infusion   Intravenous Continuous Masters, Katie, DO 125 mL/hr at 09/26/21 0402 New Bag at 09/26/21 0402  ° lipase/protease/amylase (CREON) capsule 24,000 Units  24,000 Units Oral TID with meals Aslam, Sadia, MD   24,000 Units at 09/25/21 2039  ° ondansetron (ZOFRAN) tablet 4 mg  4 mg Oral Q6H PRN Aslam, Sadia, MD      ° Or  ° ondansetron (ZOFRAN) injection 4 mg  4 mg Intravenous Q6H PRN Aslam, Sadia, MD   4 mg at 09/25/21 2315  ° ° °Allergies as of 09/25/2021  ° (No Known Allergies)  ° ° °Family History  °Problem Relation Age of Onset  ° Cancer Mother   °     unsure  ° COPD Mother   ° Hypertension Mother   ° Hypertension Father   ° COPD Father   ° CAD Sister   °     possible has stent per pt  ° ° °Social History  ° °Socioeconomic History  ° Marital status: Divorced  °  Spouse name: Not on file  ° Number of  children: Not on file  ° Years of education: Not on file  ° Highest education level: Not on file  °Occupational History  ° Not on file  °Tobacco Use  ° Smoking status: Every Day  °  Packs/day: 1.00  °  Types: Cigarettes  ° Smokeless tobacco: Former  °  Types: Snuff  ° Tobacco comments:  °  "used snuff 10-15 years in my 20s-30s"  °Vaping Use  ° Vaping Use: Never used  °Substance and Sexual Activity  ° Alcohol use: Yes  °  Alcohol/week: 2.0 standard drinks  °  Types: 2 Cans of beer per week  °  Comment: monthly  ° Drug use: No  ° Sexual activity: Not Currently  °  Partners: Male  °Other Topics Concern  ° Not on file  °Social History Narrative  ° Not on file  ° °Social Determinants of Health  ° °Financial Resource Strain: Not on file  °Food Insecurity: Not on file  °Transportation Needs: Not on file  °Physical Activity: Not on file  °Stress: Not on file  °Social Connections: Not on file  °Intimate Partner Violence: Not on file  ° ° °Review of Systems: °Pertinent positive and negative review of systems were noted in the above HPI section.  All other review of systems was otherwise negative.  ° °Physical Exam: °Vital signs in last 24 hours: °Temp:  [97.7 °F (36.5 °C)-98.6 °F (37 °C)] 97.7 °F (36.5 °C) (12/29 0759) °Pulse Rate:  [60-99] 60 (12/29 0759) °Resp:  [12-17] 16 (12/29 0654) °BP: (86-117)/(63-86) 86/63 (12/29 0759) °SpO2:  [99 %-100 %] 99 % (12/29 0759) °Weight:  [56.1 kg] 56.1 kg (12/29 0004) °Last BM Date: 09/23/21 °General:   Alert,  Well-developed, thin older African-American male pleasant and cooperative in NAD °Head:  Normocephalic and atraumatic. °Eyes:  Sclera clear, no icterus.   Conjunctiva pink. °Ears:  Normal auditory acuity. °Nose:  No deformity, discharge,  or lesions. °Mouth:  No deformity or lesions.   °Neck:  Supple; no masses or thyromegaly. °Lungs:  Clear throughout to auscultation.   No wheezes, crackles, or rhonchi. °Heart:  Regular rate and rhythm; no murmurs, clicks, rubs,  or  gallops. °Abdomen:  Soft, there is some tenderness in the left upper quadrant left flank area and left mid abdomen no guarding or rebound, BS active,nonpalp mass or hsm.  No midline   incisional scar Rectal: Not done, documented melena on admission Msk:  Symmetrical without gross deformities. . Pulses:  Normal pulses noted. Extremities:  Without clubbing or edema. Neurologic:  Alert and  oriented x4;  grossly normal neurologically. Skin:  Intact without significant lesions or rashes.. Psych:  Alert and cooperative. Normal mood and affect.  Intake/Output from previous day: 12/28 0701 - 12/29 0700 In: 2000 [IV Piggyback:2000] Out: 250 [Urine:250] Intake/Output this shift: No intake/output data recorded.  Lab Results: Recent Labs    09/25/21 0149 09/25/21 1842 09/26/21 0124  WBC 10.0  --   --   HGB 10.7* 10.4* 8.7*  HCT 32.7* 31.5* 25.6*  PLT 305  --   --    BMET Recent Labs    09/25/21 0149  NA 131*  K 3.6  CL 96*  CO2 24  GLUCOSE 121*  BUN 8  CREATININE 0.76  CALCIUM 8.4*   LFT Recent Labs    09/25/21 0149  PROT 5.3*  ALBUMIN 2.3*  AST 19  ALT 13  ALKPHOS 164*  BILITOT 0.7    IMPRESSION:  #61 55 year old African-American male with history of chronic pancreatitis and chronic abdominal pain status post Whipple procedure done at [facility] 2015, who presents to the emergency room with complaints of 1 week history of intermittent nausea and vomiting, and tarry stools over the past 1 week. In the background of this he has had frequent episodes of abdominal pain nausea and vomiting with resulting 30 pound weight loss over the past 6 months  CT imaging does not show any active pancreatitis, or any inflammatory process in the abdomen. Patient had EGD in June 2022 with finding of friable bleeding jejunal mucosa, in setting of prior Whipple and this was treated with APC  Suspect recurrent upper GI bleeding probably secondary to the end abnormal jejunal mucosa  previously noted  #2 anemia secondary to above #3 dysuria and complaints of abdominal pain with urination over the past week-no evidence for ureterolithiasis by CT-did have abnormal UA on admission-culture showing multiple species needs recollected  #4 hypertension #5 diverticulosis   Plan:  Keep n.p.o. Patient has been scheduled for EGD with Dr. Fuller Plan today.  Procedure was discussed in detail with patient including indications risk and benefits and is agreeable to proceed Continue to trend hemoglobin and transfuse for hemoglobin 7.5 or less IV PPI twice daily Reculture urine Resume home Creon when taking p.o.'s  GI will follow with you   Amy Esterwood PA-C 09/26/2021, 8:30 AM     Attending Physician Note   I have taken a history, reviewed the chart and examined the patient. I personally saw the patient and performed a substantive portion of this encounter, including a complete performance of at least one of the key components, in conjunction with the APP. I agree with the APP's note, impression and recommendations.   Intermittent N/V, melena for 1 week. S/P Whipple for chronic pancreatitis in 2015. R/O ulcer, gastritis, jejunitis. EGD in June 2022 showed friable jejunal mucosa treated with APC. PPI IV bid. Trend CBC. EGD today.   Brent Edward, MD [facility] See [facility], [place] [facility], for our on call provider

## 2021-09-26 NOTE — Op Note (Signed)
[facility] Patient Name: Brent Rogers Procedure Date : 09/26/2021 MRN: [id number] Attending MD: Ladene Artist , MD Date of Birth: September 18, 1966 CSN: [id number] Age: 55 Admit Type: Inpatient Procedure:                Upper GI endoscopy Indications:              Acute gastrojejunal ulcer with hemorrhage, Nausea                            with vomiting Providers:                Pricilla Riffle. Fuller Plan, MD, Doristine Johns, RN, Luan Moore, Technician, Lia Foyer, CRNA Referring MD:             [facility] Medicines:                Monitored Anesthesia Care Complications:            No immediate complications. Estimated Blood Loss:     Estimated blood loss was minimal. Procedure:                Pre-Anesthesia Assessment:                           - Prior to the procedure, a History and Physical                            was performed, and patient medications and                            allergies were reviewed. The patient's tolerance of                            previous anesthesia was also reviewed. The risks                            and benefits of the procedure and the sedation                            options and risks were discussed with the patient.                            All questions were answered, and informed consent                            was obtained. Prior Anticoagulants: The patient has                            taken no previous anticoagulant or antiplatelet                            agents. ASA Grade Assessment: III - A patient with  severe systemic disease. After reviewing the risks                            and benefits, the patient was deemed in                            satisfactory condition to undergo the procedure.                           After obtaining informed consent, the endoscope was                            passed under direct vision. Throughout the                             procedure, the patient's blood pressure, pulse, and                            oxygen saturations were monitored continuously. The                            [id number]-H190 ([id number] Olympus endoscope was introduced                            through the mouth, and advanced to the afferent                            jejunal loop. The upper GI endoscopy was                            accomplished without difficulty. The patient                            tolerated the procedure well. Scope In: Scope Out: Findings:      LA Grade A (one or more mucosal breaks less than 5 mm, not extending       between tops of 2 mucosal folds) esophagitis with no bleeding was found       at the gastroesophageal junction.      The exam of the esophagus was otherwise normal.      Evidence of a gastrojejunostomy was found. Prior endoscopic clip at       anastomosis. The gastrojejunal anastomosis was characterized by healthy       appearing mucosa. This was traversed. The efferent limb was not examined       as it could not be traversed. The afferent limb was examined 15 cm from       the anastomosis and was characterized by healthy appearing mucosa.      One non-bleeding cratered gastric ulcer with pigmented material was       found at the anastomosis. The lesion was 7 mm in largest dimension.      Patchy mild inflammation characterized by erythema and granularity was       found in the gastric fundus and in the gastric body. Biopsies were taken       with a cold forceps for histology.  A small hiatal hernia was present.      The exam of the stomach was otherwise normal. Impression:               - LA Class A esophagitis.                           - Small hiatal hernia                           - Gastrojejunostomy was found, characterized by                            healthy appearing mucosa.                           - Non-bleeding anastomic ulcer with pigmented                            material.                            - Gastritis. Biopsied. Recommendation:           - Return patient to hospital ward for ongoing care.                           - Full liquid diet today.                           - Follow antireflux measures.                           - Continue present medications including                            pantoprazole and sucralfate.                           - Await pathology results.                           - No aspirin, ibuprofen, naproxen, or other                            non-steroidal anti-inflammatory drugs long term. Procedure Code(s):        --- Professional ---                           [phone], Esophagogastroduodenoscopy, flexible,                            transoral; with biopsy, single or multiple Diagnosis Code(s):        --- Professional ---                           Z98.0, Intestinal bypass and anastomosis status  K25.9, Gastric ulcer, unspecified as acute or                            chronic, without hemorrhage or perforation                           K29.70, Gastritis, unspecified, without bleeding                           K28.0, Acute gastrojejunal ulcer with hemorrhage                           R11.2, Nausea with vomiting, unspecified CPT copyright 2019 [employer]. All rights reserved. The codes documented in this report are preliminary and upon coder review may  be revised to meet current compliance requirements. Ladene Artist, MD 09/26/2021 3:08:34 PM This report has been signed electronically. Number of Addenda: 0

## 2021-09-26 NOTE — Consult Note (Addendum)
Consultation  Referring Provider:   [facility] med service Primary Care Physician:  Ladell Pier, MD Primary Gastroenterologist:  none/unassigned  Reason for Consultation: Melena  HPI: Brent Rogers is a 55 y.o. male, who was admitted through the emergency room last evening with complaints of abdominal pain and black stools.  Patient says that he started having nausea and vomiting about 8 days ago and then 7 days ago noted dark jet black stools which have persisted since.  He has not had any coffee-ground emesis.  He did have some abdominal discomfort but says that this feels different than flares of his chronic pancreatitis as he was having pain primarily in his left flank and then left lower abdomen which seem to be exacerbated by urination.  He was wondering whether he had a kidney stone.  He has been unable to urinary very much over the past couple of days. He has maintained Protonix 40 mg once daily chronically, denies any aspirin or NSAID use.  On Creon at home. Patient says he has had frequent episodes of abdominal pain nausea and vomiting at home over the past 6 months and goes through periods of time where he is unable to eat or hold anything down.  He has lost about 30 pounds over the past 6 months.  He did undergo EGD and colonoscopy in June 2022 per Dr. Benson Norway when he was admitted with anemia and iron deficiency.  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  On admission hemoglobin 10.7/hematocrit 32.7 down to 8.7 this a.m. Lipase 18, LFTs normal with exception of alk phos at 164  BUN 8/creatinine 0.76  CT of the abdomen pelvis on admission no hydronephrosis, bilateral renal cysts urinary bladder unremarkable for degree of distention, status post gastrectomy and  gastrojejunostomy, no evidence of bowel inflammation, positive aortic and branch vessel atherosclerosis.  Patient has known history of chronic pancreatitis and is status post Whipple procedure done in 2015 at [facility] due to chronic abdominal pain secondary to chronic pancreatitis.  He has been hemodynamically stable since admission, no further melena overnight.    Past Medical History:  Diagnosis Date   Benign tumor of endocrine pancreas    Chronic pancreatitis ([place])    S/P Whipple   Foot ulcer with fat layer exposed ([place])    Hypertension    Migraine    "@ least once/month" (11/12/2015)   Pancreatic abnormality    CT  shows mass   Pneumonia 11/12/2015   Scoliosis     Past Surgical History:  Procedure Laterality Date   BACK SURGERY     COLONOSCOPY WITH PROPOFOL N/A 03/11/2021   Procedure: COLONOSCOPY WITH PROPOFOL;  Surgeon: Carol Ada, MD;  Location: [place];  Service: Endoscopy;  Laterality: N/A;   ESOPHAGOGASTRODUODENOSCOPY (EGD) WITH PROPOFOL N/A 03/11/2021   Procedure: ESOPHAGOGASTRODUODENOSCOPY (EGD) WITH PROPOFOL;  Surgeon: Carol Ada, MD;  Location: [place];  Service: Endoscopy;  Laterality: N/A;   EUS N/A 09/21/2013   Procedure: ESOPHAGEAL ENDOSCOPIC ULTRASOUND (EUS) RADIAL;  Surgeon: Beryle Beams, MD;  Location: [facility];  Service: Endoscopy;  Laterality: N/A;   EUS N/A 09/30/2013   Procedure: UPPER ENDOSCOPIC ULTRASOUND (EUS) LINEAR;  Surgeon: Beryle Beams, MD;  Location: [facility];  Service: Endoscopy;  Laterality: N/A;   FINE NEEDLE ASPIRATION N/A 09/21/2013  Procedure: FINE NEEDLE ASPIRATION (FNA) LINEAR;  Surgeon: Beryle Beams, MD;  Location: [facility];  Service: Endoscopy;  Laterality: N/A;   FINGER FRACTURE SURGERY Left 1995   5th digit   FRACTURE SURGERY     HEMOSTASIS CLIP PLACEMENT  03/11/2021   Procedure: HEMOSTASIS CLIP PLACEMENT;  Surgeon: Carol Ada, MD;  Location: [facility] ENDOSCOPY;  Service: Endoscopy;;   HEMOSTASIS  CONTROL  03/11/2021   Procedure: HEMOSTASIS CONTROL;  Surgeon: Carol Ada, MD;  Location: [place];  Service: Endoscopy;;   KNEE ARTHROSCOPY Bilateral 1987-1989   right-left   LAPAROSCOPY N/A 12/01/2013   Procedure: LAPAROSCOPY DIAGNOSTIC PANCREATICODUODENECTOMY WITH BILIARY AND PANCREATIC STENTS;  Surgeon:  Klein, MD;  Location: [facility];  Service: General;  Laterality: N/A;   LUMBAR [place] SURGERY  1990's   SAVORY DILATION N/A 03/11/2021   Procedure: SAVORY DILATION;  Surgeon: Carol Ada, MD;  Location: [place];  Service: Endoscopy;  Laterality: N/A;   WHIPPLE PROCEDURE  12/01/2013    Prior to Admission medications   Medication Sig Start Date End Date Taking? Authorizing Provider  amLODipine (NORVASC) 5 MG tablet Take 1 tablet (5 mg total) by mouth daily. 03/21/21  Yes Ladell Pier, MD  aspirin 325 MG tablet Take 325 mg by mouth daily as needed for moderate pain.   Yes [provider]  ferrous sulfate 325 (65 FE) MG tablet Take 1 tablet (325 mg total) by mouth daily. Patient taking differently: Take 325 mg by mouth every other day. 03/16/21  Yes Ladell Pier, MD  ondansetron (ZOFRAN-ODT) 8 MG disintegrating tablet Take 1 tablet (8 mg total) by mouth every 8 (eight) hours as needed. Patient taking differently: Take 8 mg by mouth every 8 (eight) hours as needed for nausea or vomiting. 09/09/21  Yes Ripley Fraise, MD  Pancrelipase, Lip-Prot-Amyl, (CREON) 24000-76000 units CPEP Take 1 capsule (24,000 Units total) by mouth with breakfast, with lunch, and with evening meal. Patient taking differently: Take 1 capsule by mouth daily. As per patient he eats usually once a day 03/16/21  Yes Ladell Pier, MD  promethazine (PHENERGAN) 25 MG tablet Take 1 tablet (25 mg total) by mouth every 6 (six) hours as needed for nausea or vomiting. 09/09/21  Yes Ripley Fraise, MD  famotidine (PEPCID) 20 MG tablet Take 1 tablet (20 mg total) by mouth 2 (two) times  daily. Patient not taking: Reported on 09/25/2021 09/06/21 10/06/21  France Ravens, MD  mirtazapine (REMERON) 15 MG tablet Take 1 tablet (15 mg total) by mouth at bedtime. Patient not taking: Reported on 09/09/2021 09/06/21 10/06/21  France Ravens, MD  pantoprazole (PROTONIX) 40 MG tablet Take 1 tablet (40 mg total) by mouth daily. Patient not taking: Reported on 09/25/2021 03/16/21   Ladell Pier, MD  sucralfate (CARAFATE) 1 GM/10ML suspension Take 10 mLs (1 g total) by mouth 4 (four) times daily -  with meals and at bedtime. Patient not taking: Reported on 09/09/2021 09/06/21 10/06/21  France Ravens, MD  venlafaxine XR (EFFEXOR-XR) 75 MG 24 hr capsule Take 1 capsule (75 mg total) by mouth daily with breakfast. Patient not taking: Reported on 09/09/2021 09/06/21 10/06/21  France Ravens, MD    Current Facility-Administered Medications  Medication Dose Route Frequency Provider Last Rate Last Admin   acetaminophen (TYLENOL) tablet 650 mg  650 mg Oral Q6H PRN Harvie Heck, MD   650 mg at 09/26/21 0433   Or   acetaminophen (TYLENOL) suppository 650 mg  650 mg Rectal Q6H PRN Aslam, Sadia,  MD       lactated ringers infusion   Intravenous Continuous Masters, Joellen Jersey, DO 125 mL/hr at 09/26/21 0402 New Bag at 09/26/21 0402   lipase/protease/amylase (CREON) capsule 24,000 Units  24,000 Units Oral TID with meals Harvie Heck, MD   24,000 Units at 09/25/21 2039   ondansetron (ZOFRAN) tablet 4 mg  4 mg Oral Q6H PRN Harvie Heck, MD       Or   ondansetron (ZOFRAN) injection 4 mg  4 mg Intravenous Q6H PRN Harvie Heck, MD   4 mg at 09/25/21 2315    Allergies as of 09/25/2021   (No Known Allergies)    Family History  Problem Relation Age of Onset   Cancer Mother        unsure   COPD Mother    Hypertension Mother    Hypertension Father    COPD Father    CAD Sister        possible has stent per pt    Social History   Socioeconomic History   Marital status: Divorced    Spouse name: Not on file   Number of  children: Not on file   Years of education: Not on file   Highest education level: Not on file  Occupational History   Not on file  Tobacco Use   Smoking status: Every Day    Packs/day: 1.00    Types: Cigarettes   Smokeless tobacco: Former    Types: Snuff   Tobacco comments:    "used snuff 10-15 years in my 20s-30s"  Vaping Use   Vaping Use: Never used  Substance and Sexual Activity   Alcohol use: Yes    Alcohol/week: 2.0 standard drinks    Types: 2 Cans of beer per week    Comment: monthly   Drug use: No   Sexual activity: Not Currently    Partners: Male  Other Topics Concern   Not on file  Social History Narrative   Not on file   Social Determinants of Health   Financial Resource Strain: Not on file  Food Insecurity: Not on file  Transportation Needs: Not on file  Physical Activity: Not on file  Stress: Not on file  Social Connections: Not on file  Intimate Partner Violence: Not on file    Review of Systems: Pertinent positive and negative review of systems were noted in the above HPI section.  All other review of systems was otherwise negative.   Physical Exam: Vital signs in last 24 hours: Temp:  [97.7 F (36.5 C)-98.6 F (37 C)] 97.7 F (36.5 C) (12/29 0759) Pulse Rate:  [60-99] 60 (12/29 0759) Resp:  [12-17] 16 (12/29 0654) BP: (86-117)/(63-86) 86/63 (12/29 0759) SpO2:  [99 %-100 %] 99 % (12/29 0759) Weight:  [56.1 kg] 56.1 kg (12/29 0004) Last BM Date: 09/23/21 General:   Alert,  Well-developed, thin older African-American male pleasant and cooperative in NAD Head:  Normocephalic and atraumatic. Eyes:  Sclera clear, no icterus.   Conjunctiva pink. Ears:  Normal auditory acuity. Nose:  No deformity, discharge,  or lesions. Mouth:  No deformity or lesions.   Neck:  Supple; no masses or thyromegaly. Lungs:  Clear throughout to auscultation.   No wheezes, crackles, or rhonchi. Heart:  Regular rate and rhythm; no murmurs, clicks, rubs,  or  gallops. Abdomen:  Soft, there is some tenderness in the left upper quadrant left flank area and left mid abdomen no guarding or rebound, BS active,nonpalp mass or hsm.  No midline  incisional scar Rectal: Not done, documented melena on admission Msk:  Symmetrical without gross deformities. . Pulses:  Normal pulses noted. Extremities:  Without clubbing or edema. Neurologic:  Alert and  oriented x4;  grossly normal neurologically. Skin:  Intact without significant lesions or rashes.. Psych:  Alert and cooperative. Normal mood and affect.  Intake/Output from previous day: 12/28 0701 - 12/29 0700 In: 2000 [IV Piggyback:2000] Out: 250 [Urine:250] Intake/Output this shift: No intake/output data recorded.  Lab Results: Recent Labs    09/25/21 0149 09/25/21 1842 09/26/21 0124  WBC 10.0  --   --   HGB 10.7* 10.4* 8.7*  HCT 32.7* 31.5* 25.6*  PLT 305  --   --    BMET Recent Labs    09/25/21 0149  NA 131*  K 3.6  CL 96*  CO2 24  GLUCOSE 121*  BUN 8  CREATININE 0.76  CALCIUM 8.4*   LFT Recent Labs    09/25/21 0149  PROT 5.3*  ALBUMIN 2.3*  AST 19  ALT 13  ALKPHOS 164*  BILITOT 0.7    IMPRESSION:  #61 55 year old African-American male with history of chronic pancreatitis and chronic abdominal pain status post Whipple procedure done at [facility] 2015, who presents to the emergency room with complaints of 1 week history of intermittent nausea and vomiting, and tarry stools over the past 1 week. In the background of this he has had frequent episodes of abdominal pain nausea and vomiting with resulting 30 pound weight loss over the past 6 months  CT imaging does not show any active pancreatitis, or any inflammatory process in the abdomen. Patient had EGD in June 2022 with finding of friable bleeding jejunal mucosa, in setting of prior Whipple and this was treated with APC  Suspect recurrent upper GI bleeding probably secondary to the end abnormal jejunal mucosa  previously noted  #2 anemia secondary to above #3 dysuria and complaints of abdominal pain with urination over the past week-no evidence for ureterolithiasis by CT-did have abnormal UA on admission-culture showing multiple species needs recollected  #4 hypertension #5 diverticulosis   Plan:  Keep n.p.o. Patient has been scheduled for EGD with Dr. Fuller Plan today.  Procedure was discussed in detail with patient including indications risk and benefits and is agreeable to proceed Continue to trend hemoglobin and transfuse for hemoglobin 7.5 or less IV PPI twice daily Reculture urine Resume home Creon when taking p.o.'s  GI will follow with you   Amy Esterwood PA-C 09/26/2021, 8:30 AM     Attending Physician Note   I have taken a history, reviewed the chart and examined the patient. I personally saw the patient and performed a substantive portion of this encounter, including a complete performance of at least one of the key components, in conjunction with the APP. I agree with the APP's note, impression and recommendations.   Intermittent N/V, melena for 1 week. S/P Whipple for chronic pancreatitis in 2015. R/O ulcer, gastritis, jejunitis. EGD in June 2022 showed friable jejunal mucosa treated with APC. PPI IV bid. Trend CBC. EGD today.   Lucio Edward, MD [facility] See [facility], [place] [facility], for our on call provider

## 2021-09-26 NOTE — Interval H&P Note (Signed)
History and Physical Interval Note:  09/26/2021 2:26 PM  Brent Rogers  has presented today for surgery, with the diagnosis of Melena.  The various methods of treatment have been discussed with the patient and family. After consideration of risks, benefits and other options for treatment, the patient has consented to  Procedure(s): ESOPHAGOGASTRODUODENOSCOPY (EGD) WITH PROPOFOL (N/A) as a surgical intervention.  The patient's history has been reviewed, patient examined, no change in status, stable for surgery.  I have reviewed the patient's chart and labs.  Questions were answered to the patient's satisfaction.     Pricilla Riffle. Fuller Plan

## 2021-09-27 DIAGNOSIS — K921 Melena: Secondary | ICD-10-CM | POA: Diagnosis not present

## 2021-09-27 DIAGNOSIS — K8689 Other specified diseases of pancreas: Secondary | ICD-10-CM | POA: Diagnosis not present

## 2021-09-27 DIAGNOSIS — K280 Acute gastrojejunal ulcer with hemorrhage: Secondary | ICD-10-CM | POA: Diagnosis not present

## 2021-09-27 DIAGNOSIS — K922 Gastrointestinal hemorrhage, unspecified: Secondary | ICD-10-CM | POA: Diagnosis not present

## 2021-09-27 LAB — BASIC METABOLIC PANEL
Anion gap: 8 (ref 5–15)
BUN: 5 mg/dL — ABNORMAL LOW (ref 6–20)
CO2: 22 mmol/L (ref 22–32)
Calcium: 7.7 mg/dL — ABNORMAL LOW (ref 8.9–10.3)
Chloride: 103 mmol/L (ref 98–111)
Creatinine, Ser: 0.73 mg/dL (ref 0.61–1.24)
GFR, Estimated: >60 mL/min (ref >60)
Glucose, Bld: 137 mg/dL — ABNORMAL HIGH (ref 70–99)
Potassium: 3.6 mmol/L (ref 3.5–5.1)
Sodium: 133 mmol/L — ABNORMAL LOW (ref 135–145)

## 2021-09-27 LAB — CBC WITH DIFFERENTIAL/PLATELET
Abs Immature Granulocytes: 0.13 10*3/uL — ABNORMAL HIGH (ref 0.00–0.07)
Basophils Absolute: 0.1 10*3/uL (ref 0.0–0.1)
Basophils Relative: 0 %
Eosinophils Absolute: 0 10*3/uL (ref 0.0–0.5)
Eosinophils Relative: 0 %
HCT: 27.4 % — ABNORMAL LOW (ref 39.0–52.0)
Hemoglobin: 8.8 g/dL — ABNORMAL LOW (ref 13.0–17.0)
Immature Granulocytes: 1 %
Lymphocytes Relative: 12 %
Lymphs Abs: 1.8 10*3/uL (ref 0.7–4.0)
MCH: 30.2 pg (ref 26.0–34.0)
MCHC: 32.1 g/dL (ref 30.0–36.0)
MCV: 94.2 fL (ref 80.0–100.0)
Monocytes Absolute: 1.5 10*3/uL — ABNORMAL HIGH (ref 0.1–1.0)
Monocytes Relative: 10 %
Neutro Abs: 11.6 10*3/uL — ABNORMAL HIGH (ref 1.7–7.7)
Neutrophils Relative %: 77 %
Platelets: 265 10*3/uL (ref 150–400)
RBC: 2.91 MIL/uL — ABNORMAL LOW (ref 4.22–5.81)
RDW: 14.1 % (ref 11.5–15.5)
WBC: 15.1 10*3/uL — ABNORMAL HIGH (ref 4.0–10.5)
nRBC: 0 % (ref 0.0–0.2)

## 2021-09-27 MED ORDER — PANCRELIPASE (LIP-PROT-AMYL) 12000-38000 UNITS PO CPEP
12000.0000 [IU] | ORAL_CAPSULE | Freq: Two times a day (BID) | ORAL | 0 refills | Status: DC | PRN
Start: 2021-09-27 — End: 2021-10-14
  Filled 2021-09-27: qty 100, 50d supply, fill #0
  Filled 2021-09-30 (×2): qty 200, 90d supply, fill #0

## 2021-09-27 MED ORDER — TRAMADOL HCL 50 MG PO TABS
50.0000 mg | ORAL_TABLET | Freq: Once | ORAL | Status: AC
  Administered 2021-09-27: 23:00:00 50 mg via ORAL
  Filled 2021-09-27 (×2): qty 1

## 2021-09-27 MED ORDER — PANTOPRAZOLE SODIUM 40 MG PO TBEC
40.0000 mg | DELAYED_RELEASE_TABLET | Freq: Two times a day (BID) | ORAL | 0 refills | Status: DC
  Filled 2021-09-27 – 2021-09-30 (×2): qty 112, 56d supply, fill #0

## 2021-09-27 MED ORDER — BOOST / RESOURCE BREEZE PO LIQD CUSTOM
1.0000 | Freq: Three times a day (TID) | ORAL | Status: DC
  Administered 2021-09-27: 16:00:00 1 via ORAL

## 2021-09-27 MED ORDER — ADULT MULTIVITAMIN W/MINERALS CH
1.0000 | ORAL_TABLET | Freq: Every day | ORAL | 0 refills | Status: DC
  Filled 2021-09-27: qty 30, 30d supply, fill #0

## 2021-09-27 MED ORDER — PANCRELIPASE (LIP-PROT-AMYL) 36000-114000 UNITS PO CPEP
36000.0000 [IU] | ORAL_CAPSULE | Freq: Three times a day (TID) | ORAL | 0 refills | Status: DC
  Filled 2021-09-27: qty 180, 60d supply, fill #0
  Filled 2021-09-30: qty 100, 34d supply, fill #0

## 2021-09-27 NOTE — Discharge Summary (Signed)
Name: Brent Rogers MRN: [id number] DOB: [birth year] 55 y.o. PCP: Ladell Pier, MD  Date of Admission: 09/25/2021  1:25 AM Date of Discharge: 09/27/21 Attending Physician: Dr. Daryll Drown  Discharge Diagnosis: Principal Problem:   GI bleed Active Problems:   Pancreatic insufficiency   Severe malnutrition (HCC)   Pressure injury of skin    Discharge Medications: Allergies as of 09/27/2021   No Known Allergies      Medication List     TAKE these medications    amLODipine 5 MG tablet Commonly known as: NORVASC Take 1 tablet (5 mg total) by mouth daily.   famotidine 20 MG tablet Commonly known as: PEPCID Take 1 tablet (20 mg total) by mouth 2 (two) times daily.   FeroSul 325 (65 FE) MG tablet Generic drug: ferrous sulfate Take 1 tablet (325 mg total) by mouth daily. What changed: when to take this   lipase/protease/amylase 36000 UNITS Cpep capsule Commonly known as: CREON Take 1 capsule (36,000 Units total) by mouth with breakfast, with lunch, and with evening meal. What changed:  medication strength how much to take   lipase/protease/amylase 12000-38000 units Cpep capsule Commonly known as: CREON Take 1 capsule (12,000 Units total) by mouth 2 (two) times daily as needed (with snacks). What changed: You were already taking a medication with the same name, and this prescription was added. Make sure you understand how and when to take each.   mirtazapine 15 MG tablet Commonly known as: REMERON Take 1 tablet (15 mg total) by mouth at bedtime.   multivitamin with minerals Tabs tablet Take 1 tablet by mouth daily. Start taking on: September 28, 2021   ondansetron 8 MG disintegrating tablet Commonly known as: ZOFRAN-ODT Take 1 tablet (8 mg total) by mouth every 8 (eight) hours as needed. What changed: reasons to take this   pantoprazole 40 MG tablet Commonly known as: PROTONIX Take 1 tablet (40 mg total) by mouth 2 (two) times daily. What changed: when  to take this   promethazine 25 MG tablet Commonly known as: PHENERGAN Take 1 tablet (25 mg total) by mouth every 6 (six) hours as needed for nausea or vomiting.   sucralfate 1 GM/10ML suspension Commonly known as: Carafate Take 10 mLs (1 g total) by mouth 4 (four) times daily -  with meals and at bedtime.   venlafaxine XR 75 MG 24 hr capsule Commonly known as: EFFEXOR-XR Take 1 capsule (75 mg total) by mouth daily with breakfast.               Discharge Care Instructions  (From admission, onward)           Start     Ordered   09/27/21 0000  Change dressing (specify)       Comments: Keep wound clean dry and intact   09/27/21 1441            Disposition and follow-up:   Brent Rogers was discharged from [facility] in Stable condition.  At the hospital follow up visit please address:  1.  Follow-up:  a. Melena - Ensure resolution of bleeding, hemodynamic stability, and stable hgb. He will be discharged on Protonix 40mg  PO BID for 8 weeks, then 40mg  once daily long-term. He will also be treated with Carafate 1g PO 4 times daily between meals and at bedtime for 8 weeks. May benefit from staying on Carafate 1g TID between meals long term as well. Will need to avoid NSAIDs and aspirin.  b. Pancreatic insufficiency and malnutrition - requiring Creon. Dose increased to 36,000 TID with meals and 12,000 with snacks. Started on boost breeze supplements TID with meals.    c. Anemia - monitor for stability, signs of bleeding.    d.  HTN   2.  Labs / imaging needed at time of follow-up: CBC, CMP  3.  Pending labs/ test needing follow-up: none  4.  Medication Changes  Started: Creon 12,000 with snacks, Protonix 40mg  BID 8 weeks then 40mg  once daily long-term, Carafate 1g PO 4 times daily between meals and at bedtime 8 weeks then TID long term.   Stopped: none  Changed: increased Creon 36,000 TID  Abx -  none End Date: none  Follow-up  Appointments:  Follow-up Information     Ladell Pier, MD Follow up in 1 week(s).   Specialty: Internal Medicine Contact information: [place] [address] [phone]         [facility] Follow up in 4 week(s).                PCP - Karle Plumber, MD [facility] 4-6 weeks  Hospital Course by problem list:  # melena - presented with complaints of dark tarry stools and abdominal pain for several days. Evaluated with EGD, noted to have esophagitis and non-bleeding ulcer. Biopsies were taken. No recurrence of bleeding or melenotic stools was noted during his hospitalization. His hemoglobin was was also monitored and remained stable. His diet was advanced and he tolerated regular diet well. He will be scheduled for follow up appointment with [facility] in 4-6 weeks. Deemed stable for discharge and sent home.  Pancreatic insufficiency, malnutrition - Patient was noted to be severely malnourished with a BMI of 15. Reported diarrhea, thought to be related to pancreatitic insufficiency. His Creon dose was increased and he was also started on PRN Creon with snacks. Nutrition was asked to evaluate the patient and recommended Boost Breeze supplements as well.    Anemia - iron deficiency exacerbated by acute blood loss in the setting of GIB. - Hemoglobin was noted to remain around 8, and he did not require transfusion. He was continued on his home iron supplementation.  No notes on file   Discharge Subjective: Patient reports that he is feeling okay this morning. Does endorse some gas pain and heartburn. Received tramadol overnight for pain. Reports that tylenol irritates pancreas and he has been told by PCP to avoid this. He says he feels dehydrated. He is eager to try regular diet today.  Discharge Exam:   BP 92/74 (BP Location: Right Arm)    Pulse 80    Temp 97.9 F (36.6 C) (Axillary)    Resp 18    Ht 6\' 4"  (1.93 m)    Wt 56.1 kg    SpO2 98%     BMI 15.05 kg/m  Constitutional: cachectic, chronically ill appearing, in no acute distress HENT: normocephalic atraumatic, dry mucus membranes Eyes: conjunctiva non-erythematous Neck: supple Cardiovascular: regular rate and rhythm, no m/r/g, bilateral pedal edema Pulmonary/Chest: normal work of breathing on room air, lungs clear to auscultation bilaterally Abdominal: soft, mildly tender, non-distended, midline surgical scar MSK: decreased muscle bulk, normal tone Neurological: alert & oriented x 3, 5/5 strength in bilateral upper and lower extremities, normal gait Skin: warm and dry, poor skin turgor,  Psych: mood and affect appropriate  Pertinent Labs, Studies, and Procedures:  CBC Latest Ref Rng & Units 09/27/2021 09/26/2021 09/25/2021  WBC 4.0 -  10.5 K/uL 15.1(H) - -  Hemoglobin 13.0 - 17.0 g/dL 8.8(L) 8.7(L) 10.4(L)  Hematocrit 39.0 - 52.0 % 27.4(L) 25.6(L) 31.5(L)  Platelets 150 - 400 K/uL 265 - -    CMP Latest Ref Rng & Units 09/27/2021 09/26/2021 09/25/2021  Glucose 70 - 99 mg/dL 137(H) 96 121(H)  BUN 6 - 20 mg/dL 5(L) 9 8  Creatinine 0.61 - 1.24 mg/dL 0.73 0.56(L) 0.76  Sodium 135 - 145 mmol/L 133(L) 131(L) 131(L)  Potassium 3.5 - 5.1 mmol/L 3.6 3.4(L) 3.6  Chloride 98 - 111 mmol/L 103 101 96(L)  CO2 22 - 32 mmol/L 22 25 24   Calcium 8.9 - 10.3 mg/dL 7.7(L) 7.6(L) 8.4(L)  Total Protein 6.5 - 8.1 g/dL - - 5.3(L)  Total Bilirubin 0.3 - 1.2 mg/dL - - 0.7  Alkaline Phos 38 - 126 U/L - - 164(H)  AST 15 - 41 U/L - - 19  ALT 0 - 44 U/L - - 13    CT ABDOMEN PELVIS W CONTRAST  Result Date: 09/25/2021 CLINICAL DATA:  Left lower quadrant abdominal pain. EXAM: CT ABDOMEN AND PELVIS WITH CONTRAST TECHNIQUE: Multidetector CT imaging of the abdomen and pelvis was performed using the standard protocol following bolus administration of intravenous contrast. CONTRAST:  116mL OMNIPAQUE IOHEXOL 300 MG/ML  SOLN COMPARISON:  August 13, 2021 FINDINGS: Lower chest: No acute abnormality.  Hepatobiliary: No suspicious hepatic lesion. Status post cholecystectomy and hepaticojejunostomy with postoperative pneumobilia. Pancreas: Status post Whipple pancreaticoduodenectomy. No pancreatic ductal dilation or evidence of acute inflammation. Spleen: Normal in size without focal abnormality. Adrenals/Urinary Tract: Bilateral adrenal glands are unremarkable. No hydronephrosis. Bilateral renal cysts. No solid enhancing renal mass. Urinary bladder is unremarkable for degree of distension. Stomach/Bowel: Status post distal gastrectomy and gastrojejunostomy. No pathologic dilation of large or small bowel. Visualized portions of the appendix appear normal additionally there is no pericecal inflammation. No evidence of acute bowel inflammation. Vascular/Lymphatic: Aortic and branch vessel atherosclerosis without abdominal aortic aneurysm. No pathologically enlarged abdominal or pelvic lymph nodes. Reproductive: Prostate is unremarkable. Other: No significant abdominopelvic free fluid. No pneumoperitoneum. Musculoskeletal: Thoracolumbar spondylosis. No acute osseous abnormality. IMPRESSION: 1. No acute findings in the abdomen or pelvis. 2. Status post Whipple pancreaticoduodenectomy. 3.  Aortic Atherosclerosis (ICD10-I70.0). Electronically Signed   By: Dahlia Bailiff M.D.   On: 09/25/2021 17:34   VAS [place] ABI WITH/WO TBI  Result Date: 09/26/2021  LOWER EXTREMITY DOPPLER STUDY Patient Name:  ALEXX MCBURNEY  Date of Exam:   09/26/2021 Medical Rec #: [id number]         Accession #:    [id number] Date of Birth: [birth year]          Patient Gender: M Patient Age:   4 years Exam Location:  [facility] Procedure:      VAS [place] ABI WITH/WO TBI Referring Phys: [name] Lemming --------------------------------------------------------------------------------  Indications: Unable to palpate/doppler pulses. High Risk Factors: Current smoker, coronary artery disease.  Comparison Study: 05-08-2016 ABI w/ TBI was 1.19/0.7 on the  RT and 1.2/0.6 on                   the LT. Performing Technologist: Darlin Coco RDMS RVT  Examination Guidelines: A complete evaluation includes at minimum, Doppler waveform signals and systolic blood pressure reading at the level of bilateral brachial, anterior tibial, and posterior tibial arteries, when vessel segments are accessible. Bilateral testing is considered an integral part of a complete examination. Photoelectric Plethysmograph (PPG) waveforms and toe systolic pressure readings are included  as required and additional duplex testing as needed. Limited examinations for reoccurring indications may be performed as noted.  ABI Findings: +--------+------------------+-----+---------+--------+  Right    Rt Pressure (mmHg) Index Waveform  Comment   +--------+------------------+-----+---------+--------+  Brachial 93                       triphasic           +--------+------------------+-----+---------+--------+  PTA      113                1.22  triphasic           +--------+------------------+-----+---------+--------+  DP       113                1.22  triphasic           +--------+------------------+-----+---------+--------+ +--------+------------------+-----+---------+-------+  Left     Lt Pressure (mmHg) Index Waveform  Comment  +--------+------------------+-----+---------+-------+  Brachial 90                       triphasic          +--------+------------------+-----+---------+-------+  PTA      98                 1.05  triphasic          +--------+------------------+-----+---------+-------+  DP       106                1.14  triphasic          +--------+------------------+-----+---------+-------+ +-------+-----------+-----------+------------+------------+  ABI/TBI Today's ABI Today's TBI Previous ABI Previous TBI  +-------+-----------+-----------+------------+------------+  Right   1.22                    1.19         0.70          +-------+-----------+-----------+------------+------------+  Left    1.14                     1.20         0.60          +-------+-----------+-----------+------------+------------+ Right ABIs appear essentially unchanged compared to prior study on 05-08-2016. Left ABIs appear essentially unchanged compared to prior study on 05-08-2016.  Summary: Right: Resting right ankle-brachial index is within normal range. No evidence of significant right lower extremity arterial disease. Left: Resting left ankle-brachial index is within normal range. No evidence of significant left lower extremity arterial disease.  *See table(s) above for measurements and observations.  Electronically signed by Harold Barban MD on 09/26/2021 at 8:00:32 PM.    Final      Discharge Instructions: Discharge Instructions     Call MD for:  difficulty breathing, headache or visual disturbances   Complete by: As directed    Call MD for:  extreme fatigue   Complete by: As directed    Call MD for:  hives   Complete by: As directed    Call MD for:  persistant dizziness or light-headedness   Complete by: As directed    Call MD for:  persistant nausea and vomiting   Complete by: As directed    Call MD for:  redness, tenderness, or signs of infection (pain, swelling, redness, odor or green/yellow discharge around incision site)   Complete by: As directed    Call MD for:  severe uncontrolled pain   Complete by: As directed  Call MD for:  temperature >100.4   Complete by: As directed    Change dressing (specify)   Complete by: As directed    Keep wound clean dry and intact   Diet - low sodium heart healthy   Complete by: As directed    Increase activity slowly   Complete by: As directed      We evaluated you for dark tarry stools and abdominal pain. We performed an EGD and noticed some inflammation in your esophagus and a non-bleeding ulcer. To help treat these, please take 40mg  Protonix twice daily for 8 weeks. At the 8 week mark, you will need to take 40mg  Protonix once daily long-term. We will  also prescribe your Carafate 1g to take 4 times daily between meals and at bedtime for 8 weeks. At the 8 week mark, you may continue taking the Carafate 1g three times daily between meals long term. Please avoid medications like aspirin and NSAIDs as they can increase your risk of bleeding. [facility] is planning on scheduling a follow up appointment in 4-6 weeks. If you do not hear from them in the next few days, please call their office.   We have also increased your Creon to 36,000 units three times daily with meals and started you on 12,000 units with snacks. To further help with your nutrition, we recommend the Boost Breeze liquid supplements with meals.  Please continue to drink plenty of fluids to ensure you stay hydrated.  Please call your PCP to schedule a hospital follow up appointment in 1 week.  Signed: Delene Ruffini, MD 09/27/2021, 2:42 PM   Pager: [phone]

## 2021-09-27 NOTE — Progress Notes (Addendum)
Patient ID: Brent Rogers, male   DOB: [birth year], 55 y.o.   MRN: [id number]    Progress Note   Subjective  Day # 2 CC: melena, anemia,recent 30 pound weight loss  EGD yesterday-mild distal esophagitis, 1 nonbleeding cratered gastric ulcer with pigmented material at the anastomosis of his gastric jejunostomy, 7 mm  Gastric biopsies pending  WBC 15.1/hemoglobin 8.8/hematocrit 27.4 stable since yesterday  No further active bleeding, no further melena.  Says he is feeling a bit better and just ate a solid meal.     Objective   Vital signs in last 24 hours: Temp:  [97.5 F (36.4 C)-98.7 F (37.1 C)] 98.1 F (36.7 C) (12/30 0902) Pulse Rate:  [62-83] 77 (12/30 0902) Resp:  [13-16] 16 (12/30 0902) BP: (94-138)/(68-86) 94/71 (12/30 0902) SpO2:  [94 %-100 %] 94 % (12/30 0902) Weight:  [56.1 kg] 56.1 kg (12/29 1320) Last BM Date: 09/23/21 General: Older African-American male in NAD Heart:  Regular rate and rhythm; no murmurs Lungs: Respirations even and unlabored, lungs CTA bilaterally Abdomen:  Soft, nontender and nondistended. Normal bowel sounds. Extremities:  Without edema. Neurologic:  Alert and oriented,  grossly normal neurologically. Psych:  Cooperative. Normal mood and affect.  Intake/Output from previous day: 12/29 0701 - 12/30 0700 In: 520 [P.O.:120; I.V.:400] Out: 525 [Urine:525] Intake/Output this shift: Total I/O In: 120 [P.O.:120] Out: 175 [Urine:175]  Lab Results: Recent Labs    09/25/21 0149 09/25/21 1842 09/26/21 0124 09/27/21 0052  WBC 10.0  --   --  15.1*  HGB 10.7* 10.4* 8.7* 8.8*  HCT 32.7* 31.5* 25.6* 27.4*  PLT 305  --   --  265   BMET Recent Labs    09/25/21 0149 09/26/21 0742 09/27/21 0052  NA 131* 131* 133*  K 3.6 3.4* 3.6  CL 96* 101 103  CO2 24 25 22   GLUCOSE 121* 96 137*  BUN 8 9 5*  CREATININE 0.76 0.56* 0.73  CALCIUM 8.4* 7.6* 7.7*   LFT Recent Labs    09/25/21 0149  PROT 5.3*  ALBUMIN 2.3*  AST 19  ALT 13   ALKPHOS 164*  BILITOT 0.7    Studies/Results: CT ABDOMEN PELVIS W CONTRAST  Result Date: 09/25/2021 CLINICAL DATA:  Left lower quadrant abdominal pain. EXAM: CT ABDOMEN AND PELVIS WITH CONTRAST TECHNIQUE: Multidetector CT imaging of the abdomen and pelvis was performed using the standard protocol following bolus administration of intravenous contrast. CONTRAST:  16mL OMNIPAQUE IOHEXOL 300 MG/ML  SOLN COMPARISON:  August 13, 2021 FINDINGS: Lower chest: No acute abnormality. Hepatobiliary: No suspicious hepatic lesion. Status post cholecystectomy and hepaticojejunostomy with postoperative pneumobilia. Pancreas: Status post Whipple pancreaticoduodenectomy. No pancreatic ductal dilation or evidence of acute inflammation. Spleen: Normal in size without focal abnormality. Adrenals/Urinary Tract: Bilateral adrenal glands are unremarkable. No hydronephrosis. Bilateral renal cysts. No solid enhancing renal mass. Urinary bladder is unremarkable for degree of distension. Stomach/Bowel: Status post distal gastrectomy and gastrojejunostomy. No pathologic dilation of large or small bowel. Visualized portions of the appendix appear normal additionally there is no pericecal inflammation. No evidence of acute bowel inflammation. Vascular/Lymphatic: Aortic and branch vessel atherosclerosis without abdominal aortic aneurysm. No pathologically enlarged abdominal or pelvic lymph nodes. Reproductive: Prostate is unremarkable. Other: No significant abdominopelvic free fluid. No pneumoperitoneum. Musculoskeletal: Thoracolumbar spondylosis. No acute osseous abnormality. IMPRESSION: 1. No acute findings in the abdomen or pelvis. 2. Status post Whipple pancreaticoduodenectomy. 3.  Aortic Atherosclerosis (ICD10-I70.0). Electronically Signed   By: Dahlia Bailiff M.D.   On: 09/25/2021  17:34   VAS [place] ABI WITH/WO TBI  Result Date: 09/26/2021  LOWER EXTREMITY DOPPLER STUDY Patient Name:  JACQUISE RARICK  Date of Exam:    09/26/2021 Medical Rec #: [id number]         Accession #:    [id number] Date of Birth: [birth year]          Patient Gender: M Patient Age:   14 years Exam Location[facility]  [facility] Procedure:      VAS [place] ABI WITH/WO TBI Referring Phys: [name] Lemming --------------------------------------------------------------------------------  Indications: Unable to palpate/doppler pulses. High Risk Factors: Current smoker, coronary artery disease.  Comparison Study: 05-08-2016 ABI w/ TBI was 1.19/0.7 on the RT and 1.2/0.6 on                   the LT. Performing Technologist: Darlin Coco RDMS RVT  Examination Guidelines: A complete evaluation includes at minimum, Doppler waveform signals and systolic blood pressure reading at the level of bilateral brachial, anterior tibial, and posterior tibial arteries, when vessel segments are accessible. Bilateral testing is considered an integral part of a complete examination. Photoelectric Plethysmograph (PPG) waveforms and toe systolic pressure readings are included as required and additional duplex testing as needed. Limited examinations for reoccurring indications may be performed as noted.  ABI Findings: +--------+------------------+-----+---------+--------+  Right    Rt Pressure (mmHg) Index Waveform  Comment   +--------+------------------+-----+---------+--------+  Brachial 93                       triphasic           +--------+------------------+-----+---------+--------+  PTA      113                1.22  triphasic           +--------+------------------+-----+---------+--------+  DP       113                1.22  triphasic           +--------+------------------+-----+---------+--------+ +--------+------------------+-----+---------+-------+  Left     Lt Pressure (mmHg) Index Waveform  Comment  +--------+------------------+-----+---------+-------+  Brachial 90                       triphasic          +--------+------------------+-----+---------+-------+  PTA      98                  1.05  triphasic          +--------+------------------+-----+---------+-------+  DP       106                1.14  triphasic          +--------+------------------+-----+---------+-------+ +-------+-----------+-----------+------------+------------+  ABI/TBI Today's ABI Today's TBI Previous ABI Previous TBI  +-------+-----------+-----------+------------+------------+  Right   1.22                    1.19         0.70          +-------+-----------+-----------+------------+------------+  Left    1.14                    1.20         0.60          +-------+-----------+-----------+------------+------------+ Right ABIs appear essentially unchanged compared to prior study on 05-08-2016. Left ABIs appear essentially unchanged compared to  prior study on 05-08-2016.  Summary: Right: Resting right ankle-brachial index is within normal range. No evidence of significant right lower extremity arterial disease. Left: Resting left ankle-brachial index is within normal range. No evidence of significant left lower extremity arterial disease.  *See table(s) above for measurements and observations.  Electronically signed by Harold Barban MD on 09/26/2021 at 8:00:32 PM.    Final        Assessment / Plan:    #48 55 year old African-American male with history of chronic pancreatitis, status post Whipple procedure [place] 2015, history of chronic abdominal pain who was admitted with 1 week history of intermittent nausea, vomiting and black stools x1 week. In the background of this had been having frequent episodes of abdominal pain nausea vomiting and subsequent 30 pound weight loss.  CT without evidence of active pancreatitis or other inflammatory process in the abdomen  EGD yesterday with finding of an anastomotic ulcer at the gastrojejunostomy anastomosis, no active bleeding, no endoscopic therapy needed Gastric biopsies pending  #2 anemia secondary to above, stable #3 chronic pancreatitis #4 hypertension #5  history of diverticulosis #6 question UTI defer to primary team  Plan:  Patient is stable for discharge from a GI perspective Avoid aspirin and NSAIDs long term  Discharge GI meds: Protonix 40 mg p.o. twice daily x 8 weeks then 40 mg once daily long-term Carafate 1 g p.o. 4 times daily between meals and at bedtime x 8 weeks  Creon 24000-76000 U 1 po tid with meals   This patient may benefit from staying on Carafate 1 g 3 times daily between meals long-term as well. Patient to have follow-up labs in a week-okay to be followed by PCP.  I will ask our office to call him next week with follow-up appointment with GI in 4 to 6 weeks   LOS: 1 day   Amy Esterwood PA-C 09/27/2021, 10:46 AM     Attending Physician Note   I have taken an interval history, reviewed the chart and examined the patient. I personally saw the patient and performed a substantive portion of this encounter, including a complete performance of at least one of the key components, in conjunction with the APP. I agree with the APP's note, impression and recommendations.   Lucio Edward, MD [facility] See AMION, [facility], for our on call provider

## 2021-09-27 NOTE — Discharge Instructions (Addendum)
Dear Mr. Farrier,  We evaluated you for dark tarry stools and abdominal pain. We performed an EGD and noticed some inflammation in your esophagus and a non-bleeding ulcer. To help treat these, please take 40mg  Protonix twice daily for 8 weeks. At the 8 week mark, you will need to take 40mg  Protonix once daily long-term. We will also prescribe your Carafate 1g to take 4 times daily between meals and at bedtime for 8 weeks. At the 8 week mark, you may continue taking the Carafate 1g three times daily between meals long term. Please avoid medications like aspirin and NSAIDs as they can increase your risk of bleeding. [facility] is planning on scheduling a follow up appointment in 4-6 weeks. If you do not hear from them in the next few days, please call their office.   We have also increased your Creon to 36,000 units three times daily with meals and started you on 12,000 units with snacks. To further help with your nutrition, we recommend the Boost Breeze liquid supplements with meals.  Please continue to drink plenty of fluids to ensure you stay hydrated.  Please call your PCP to schedule a hospital follow up appointment in 1 week.

## 2021-09-27 NOTE — Plan of Care (Signed)

## 2021-09-27 NOTE — Progress Notes (Addendum)
HD#1 SUBJECTIVE:  Patient Summary: Brent Rogers is a 55 y.o. with a pertinent PMH of  HTN, chronic pancreatitis on Creo s/p whipple with chronic abd pain presents with complaints of lower abdominal pain and dark stools for the past 1 week and admitted for suspected GI bleed.   Overnight Events: no acute events overnight, EGD yesterday.    Interm History: Patient evaluated at bedside. He reports that he is feeling okay this morning. Does endorse some gas pain and heartburn. Received tramadol overnight for pain. Reports that tylenol irritates pancreas and he has been told by PCP to avoid this. He says he feels dehydrated. He is eager to try regular diet today.  OBJECTIVE:  Vital Signs: Vitals:   09/26/21 2054 09/26/21 2359 09/27/21 0343 09/27/21 0902  BP: 103/83 99/77 97/71  94/71  Pulse: 72 70 72 77  Resp: 14 15 15 16   Temp: 98.5 F (36.9 C) 98.4 F (36.9 C) 98.4 F (36.9 C) 98.1 F (36.7 C)  TempSrc: Oral Oral Oral Oral  SpO2:  100% 97% 94%  Weight:      Height:       Supplemental O2: Room Air SpO2: 94 % O2 Flow Rate (L/min): 3 L/min  Filed Weights   09/26/21 0004 09/26/21 1320  Weight: 56.1 kg 56.1 kg     Intake/Output Summary (Last 24 hours) at 09/27/2021 4008 Last data filed at 09/27/2021 0857 Gross per 24 hour  Intake 640 ml  Output 575 ml  Net 65 ml   Net IO Since Admission: 1,690 mL [09/27/21 0958]  Physical Exam: Constitutional: cachectic, chronically ill appearing, in no acute distress HENT: normocephalic atraumatic, dry mucus membranes Eyes: conjunctiva non-erythematous Neck: supple Cardiovascular: regular rate and rhythm, no m/r/g, bilateral pedal edema Pulmonary/Chest: normal work of breathing on room air, lungs clear to auscultation bilaterally Abdominal: soft, tender, non-distended, midline surgical scar MSK: decreased muscle bulk, normal tone Neurological: alert & oriented x 3, 5/5 strength in bilateral upper and lower extremities, normal  gait Skin: warm and dry, poor skin turgor,  Psych: mood and affect appropriate  Patient Lines/Drains/Airways Status     Active Line/Drains/Airways     Name Placement date Placement time Site Days   Peripheral IV 09/25/21 20 G Anterior;Left;Upper Arm 09/25/21  --  Arm  1   Pressure Injury 09/25/21 Coccyx Medial Stage 1 -  Intact skin with non-blanchable redness of a localized area usually over a bony prominence. healed pressure ulcer 09/25/21  2320  -- 1            Pertinent Labs: CBC Latest Ref Rng & Units 09/27/2021 09/26/2021 09/25/2021  WBC 4.0 - 10.5 K/uL 15.1(H) - -  Hemoglobin 13.0 - 17.0 g/dL 8.8(L) 8.7(L) 10.4(L)  Hematocrit 39.0 - 52.0 % 27.4(L) 25.6(L) 31.5(L)  Platelets 150 - 400 K/uL 265 - -    CMP Latest Ref Rng & Units 09/27/2021 09/26/2021 09/25/2021  Glucose 70 - 99 mg/dL 137(H) 96 121(H)  BUN 6 - 20 mg/dL 5(L) 9 8  Creatinine 0.61 - 1.24 mg/dL 0.73 0.56(L) 0.76  Sodium 135 - 145 mmol/L 133(L) 131(L) 131(L)  Potassium 3.5 - 5.1 mmol/L 3.6 3.4(L) 3.6  Chloride 98 - 111 mmol/L 103 101 96(L)  CO2 22 - 32 mmol/L 22 25 24   Calcium 8.9 - 10.3 mg/dL 7.7(L) 7.6(L) 8.4(L)  Total Protein 6.5 - 8.1 g/dL - - 5.3(L)  Total Bilirubin 0.3 - 1.2 mg/dL - - 0.7  Alkaline Phos 38 - 126 U/L - -  164(H)  AST 15 - 41 U/L - - 19  ALT 0 - 44 U/L - - 13    No results for input(s): GLUCAP in the last 72 hours.   Pertinent Imaging: No results found.  ASSESSMENT/PLAN:  Assessment: Principal Problem:   GI bleed Active Problems:   Pancreatic insufficiency   Severe malnutrition (HCC)   Pressure injury of skin   Brent Rogers is a 55 y.o. with pertinent hx of HTN, chronic pancreatitis on Creon s/p whipple with chronic abd pain presents with complaints of lower abdominal pain and dark stools for the past 1 week on hospital day 1  Plan: # melena # abd pain - reports dark tarry stools. FOBT positive. Has not had any more dark tarry stools since admission.  - EGD  performed yesterday and did show evidence of esophagitis and a non-bleeding ulcer. Will continue pantoprazole, sulcrafate, antireflux measures. Avoid NSAIDs. - hgb 8.7 today, down from 10. Received IVFs, likely dilutional. Will continue to monitor and transfuse if it drops below 7. - Patient wanting food. Will give regular diet and see how he tolerates. If he is able to tolerate PO intake without severe pain, anticipate that he may be able to discharge later today.   #weightloss # chronic pancreatitis with pancreatic insufficiency  # s/p cholecystectomy # Nausea, vomiting, diarrhea - patient reporting approximately 30 pound weight loss over last 6 months. Suspect this may be related to pancreatitis insufficiency. Likely exacerbated by recent nausea and vomiting, creon may not be able to take effect. Poor PO intake overall also impacting weight gain.  - He has been having diarrhea even on previous dose of Creon, we have increased this in the hopes that he will be able to better absorb nutrients. 36,000 units TID with meals and 12,000 units with  snacks.  - patient albumin 2.3, he has some LEE in feet bilaterally - appreciate nutrition recs - can consider SLP eval if continued problems swallowing. -  Encourage PO fluid intake for dehydration. If he is not able to tolerate PO, can do IVFs for dehydration. Suspect hyponatremia to be hypovolemic.   # Leukocytosis - 15 today s/p EGD yesterday. Denies fever or chills, no cough, new abdominal symptoms, no urinary frequency. Leukocytosis possibly related to recent EGD since biopsies were taken.  - continue to monitor for signs of infection. Will avoid abx for now.    # iron deficiency anemia - restarted home iron supplementation. Iron deficiency anemia is a possible complication of Whipple procedure. Despite his oral supplementation, he is likely not absorbing it well, will give IV iron as well.  - monitor and transfuse as necessary.  # HTN - patient  has a history of hypertension, however, his blood pressure has been on the low end of normal during this admission. He has had poor PO intake recently, as well as chronic diarrhea, likely impacting blood pressure.  - will continue to monitor.   #diminished pedal pulses - Lower extremities cool. Unable to palpate pedal pulses. Unable to find pulses with doppler. - ABIs obtained and were within normal limits.    # MDD  Best Practice: Diet:  full liquids VTE: SCDs IVF: LR,[place] Code: Full Prior to Admission Living Arrangement: Home, living   Anticipated Discharge Location: Home Barriers to Discharge: medical stability  Signature: Delene Ruffini, MD  Internal Medicine Resident, PGY-1 [facility]  Pager: [phone] 9:58 AM, 09/27/2021   Please contact the on call pager after 5 pm  and on weekends at [phone].

## 2021-09-28 MED ORDER — TRAMADOL HCL 50 MG PO TABS
50.0000 mg | ORAL_TABLET | Freq: Once | ORAL | Status: AC
  Administered 2021-09-28: 50 mg via ORAL
  Filled 2021-09-28: qty 1

## 2021-09-28 NOTE — Progress Notes (Signed)
Pt alert and oriented x4 in no acute distress upon discharge. Pt has taken all of his belongings with him. Pt wheeled down to main entrance where his aunt was awaiting for him to transport pt home via her private vehicle.

## 2021-09-28 NOTE — Progress Notes (Signed)
Discharge paperwork reviewed with pt. Pt verbalized understanding. Pt stated he's going to finish his breakfast and leave around 0900.

## 2021-09-29 ENCOUNTER — Encounter (HOSPITAL_COMMUNITY): Payer: Self-pay | Admitting: Gastroenterology

## 2021-09-30 ENCOUNTER — Other Ambulatory Visit: Payer: Self-pay

## 2021-10-01 ENCOUNTER — Telehealth: Payer: Self-pay

## 2021-10-01 ENCOUNTER — Encounter: Payer: Self-pay | Admitting: Gastroenterology

## 2021-10-01 ENCOUNTER — Other Ambulatory Visit: Payer: Self-pay

## 2021-10-01 LAB — SURGICAL PATHOLOGY

## 2021-10-01 NOTE — Telephone Encounter (Signed)
Transition Care Management Follow-up Telephone Call Date of discharge and from where: 09/28/2021, [facility] How have you been since you were released from the hospital? He said he is feeling " a little rough" but all right.  Any questions or concerns? Yes - he said he has no medications, including no pain medication. He is requesting a cane and bedside commode   Items Reviewed: Did the pt receive and understand the discharge instructions provided? Yes  Medications obtained and verified?  He has no medications and said that he called [facility]  and they will be mailing them to him.   Informed him that there are multiple pharmacies in [place] that will deliver his medications and he should consider this going forward.   Other? No  Any new allergies since your discharge? No  Dietary orders reviewed? Yes, he said that his appetite is poor.  Do you have support at home?  Lives alone   Home Care and Equipment/Supplies: Were home health services ordered? no If so, what is the name of the agency? N/a  Has the agency set up a time to come to the patient's home? not applicable Were any new equipment or medical supplies ordered?  No What is the name of the medical supply agency? N/a Were you able to get the supplies/equipment? not applicable Do you have any questions related to the use of the equipment or supplies? No  Functional Questionnaire: (I = Independent and D = Dependent) ADLs: independent   Follow up appointments reviewed:  PCP Hospital f/u appt confirmed? Yes  Scheduled to see Dr Wynetta Emery  on 10/11/2021. The patient is aware that the appointment is virtual . [facility] f/u appt confirmed? Yes  Scheduled to see GI on 10/28/2021 Are transportation arrangements needed?  He said that he takes the bus. Informed him that his insurance will provide transportation to medical appointments free of charge. He said he was not aware of that. This CM offered to provide him with  the phone number for [facility] but he said he will just call the phone number on his card.  If their condition worsens, is the pt aware to call PCP or go to the Emergency Dept.? Yes Was the patient provided with contact information for the PCP's office or ED? Yes Was to pt encouraged to call back with questions or concerns? Yes

## 2021-10-09 ENCOUNTER — Inpatient Hospital Stay (HOSPITAL_COMMUNITY)
Admission: EM | Admit: 2021-10-09 | Discharge: 2021-10-14 | DRG: 432 | Disposition: A | Discharge status: Home / Self Care | Payer: Medicaid Other | Attending: Student in an Organized Health Care Education/Training Program | Admitting: Student in an Organized Health Care Education/Training Program

## 2021-10-09 ENCOUNTER — Encounter (HOSPITAL_COMMUNITY): Payer: Self-pay

## 2021-10-09 ENCOUNTER — Other Ambulatory Visit: Payer: Self-pay

## 2021-10-09 ENCOUNTER — Emergency Department (HOSPITAL_COMMUNITY): Payer: Medicaid Other

## 2021-10-09 DIAGNOSIS — K5900 Constipation, unspecified: Secondary | ICD-10-CM | POA: Diagnosis present

## 2021-10-09 DIAGNOSIS — K861 Other chronic pancreatitis: Secondary | ICD-10-CM | POA: Diagnosis present

## 2021-10-09 DIAGNOSIS — R7989 Other specified abnormal findings of blood chemistry: Secondary | ICD-10-CM

## 2021-10-09 DIAGNOSIS — B179 Acute viral hepatitis, unspecified: Secondary | ICD-10-CM | POA: Diagnosis not present

## 2021-10-09 DIAGNOSIS — Z20822 Contact with and (suspected) exposure to covid-19: Secondary | ICD-10-CM | POA: Diagnosis present

## 2021-10-09 DIAGNOSIS — Z7141 Alcohol abuse counseling and surveillance of alcoholic: Secondary | ICD-10-CM | POA: Diagnosis not present

## 2021-10-09 DIAGNOSIS — E46 Unspecified protein-calorie malnutrition: Secondary | ICD-10-CM | POA: Diagnosis not present

## 2021-10-09 DIAGNOSIS — Z681 Body mass index (BMI) 19 or less, adult: Secondary | ICD-10-CM | POA: Diagnosis not present

## 2021-10-09 DIAGNOSIS — I10 Essential (primary) hypertension: Secondary | ICD-10-CM | POA: Diagnosis present

## 2021-10-09 DIAGNOSIS — R5381 Other malaise: Secondary | ICD-10-CM

## 2021-10-09 DIAGNOSIS — K7010 Alcoholic hepatitis without ascites: Secondary | ICD-10-CM | POA: Diagnosis present

## 2021-10-09 DIAGNOSIS — K7200 Acute and subacute hepatic failure without coma: Secondary | ICD-10-CM | POA: Diagnosis present

## 2021-10-09 DIAGNOSIS — K259 Gastric ulcer, unspecified as acute or chronic, without hemorrhage or perforation: Secondary | ICD-10-CM

## 2021-10-09 DIAGNOSIS — K8689 Other specified diseases of pancreas: Secondary | ICD-10-CM | POA: Diagnosis not present

## 2021-10-09 DIAGNOSIS — F17210 Nicotine dependence, cigarettes, uncomplicated: Secondary | ICD-10-CM | POA: Diagnosis present

## 2021-10-09 DIAGNOSIS — E43 Unspecified severe protein-calorie malnutrition: Secondary | ICD-10-CM | POA: Diagnosis present

## 2021-10-09 DIAGNOSIS — R778 Other specified abnormalities of plasma proteins: Secondary | ICD-10-CM | POA: Diagnosis present

## 2021-10-09 DIAGNOSIS — I959 Hypotension, unspecified: Secondary | ICD-10-CM | POA: Diagnosis present

## 2021-10-09 DIAGNOSIS — G8929 Other chronic pain: Secondary | ICD-10-CM | POA: Diagnosis present

## 2021-10-09 DIAGNOSIS — Z8249 Family history of ischemic heart disease and other diseases of the circulatory system: Secondary | ICD-10-CM

## 2021-10-09 DIAGNOSIS — D509 Iron deficiency anemia, unspecified: Secondary | ICD-10-CM | POA: Diagnosis present

## 2021-10-09 DIAGNOSIS — R112 Nausea with vomiting, unspecified: Secondary | ICD-10-CM | POA: Diagnosis not present

## 2021-10-09 DIAGNOSIS — Z9049 Acquired absence of other specified parts of digestive tract: Secondary | ICD-10-CM | POA: Diagnosis not present

## 2021-10-09 DIAGNOSIS — W19XXXA Unspecified fall, initial encounter: Secondary | ICD-10-CM | POA: Diagnosis present

## 2021-10-09 DIAGNOSIS — I700 Atherosclerosis of aorta: Secondary | ICD-10-CM | POA: Diagnosis present

## 2021-10-09 DIAGNOSIS — Z79899 Other long term (current) drug therapy: Secondary | ICD-10-CM

## 2021-10-09 DIAGNOSIS — Z90411 Acquired partial absence of pancreas: Secondary | ICD-10-CM

## 2021-10-09 DIAGNOSIS — K257 Chronic gastric ulcer without hemorrhage or perforation: Secondary | ICD-10-CM | POA: Diagnosis present

## 2021-10-09 DIAGNOSIS — R627 Adult failure to thrive: Secondary | ICD-10-CM | POA: Diagnosis present

## 2021-10-09 DIAGNOSIS — E860 Dehydration: Secondary | ICD-10-CM | POA: Diagnosis present

## 2021-10-09 DIAGNOSIS — G894 Chronic pain syndrome: Secondary | ICD-10-CM

## 2021-10-09 LAB — TROPONIN I (HIGH SENSITIVITY)
Troponin I (High Sensitivity): 42 ng/L — ABNORMAL HIGH (ref <18)
Troponin I (High Sensitivity): 28 ng/L — ABNORMAL HIGH (ref <18)

## 2021-10-09 LAB — CBC WITH DIFFERENTIAL/PLATELET
Abs Immature Granulocytes: 0.02 10*3/uL (ref 0.00–0.07)
Basophils Absolute: 0.1 10*3/uL (ref 0.0–0.1)
Basophils Relative: 1 %
Eosinophils Absolute: 0 10*3/uL (ref 0.0–0.5)
Eosinophils Relative: 1 %
HCT: 35.1 % — ABNORMAL LOW (ref 39.0–52.0)
Hemoglobin: 11.6 g/dL — ABNORMAL LOW (ref 13.0–17.0)
Immature Granulocytes: 0 %
Lymphocytes Relative: 37 %
Lymphs Abs: 2.1 10*3/uL (ref 0.7–4.0)
MCH: 30 pg (ref 26.0–34.0)
MCHC: 33 g/dL (ref 30.0–36.0)
MCV: 90.7 fL (ref 80.0–100.0)
Monocytes Absolute: 0.2 10*3/uL (ref 0.1–1.0)
Monocytes Relative: 3 %
Neutro Abs: 3.2 10*3/uL (ref 1.7–7.7)
Neutrophils Relative %: 58 %
Platelets: 276 10*3/uL (ref 150–400)
RBC: 3.87 MIL/uL — ABNORMAL LOW (ref 4.22–5.81)
RDW: 15.1 % (ref 11.5–15.5)
WBC: 5.6 10*3/uL (ref 4.0–10.5)
nRBC: 0 % (ref 0.0–0.2)

## 2021-10-09 LAB — COMPREHENSIVE METABOLIC PANEL
ALT: 522 U/L — ABNORMAL HIGH (ref 0–44)
ALT: 333 U/L — ABNORMAL HIGH (ref 0–44)
AST: 1904 U/L — ABNORMAL HIGH (ref 15–41)
AST: 862 U/L — ABNORMAL HIGH (ref 15–41)
Albumin: 2.7 g/dL — ABNORMAL LOW (ref 3.5–5.0)
Albumin: 2.1 g/dL — ABNORMAL LOW (ref 3.5–5.0)
Alkaline Phosphatase: 286 U/L — ABNORMAL HIGH (ref 38–126)
Alkaline Phosphatase: 251 U/L — ABNORMAL HIGH (ref 38–126)
Anion gap: 14 (ref 5–15)
Anion gap: 9 (ref 5–15)
BUN: <5 mg/dL — ABNORMAL LOW (ref 6–20)
BUN: 5 mg/dL — ABNORMAL LOW (ref 6–20)
CO2: 23 mmol/L (ref 22–32)
CO2: 22 mmol/L (ref 22–32)
Calcium: 8.4 mg/dL — ABNORMAL LOW (ref 8.9–10.3)
Calcium: 7.8 mg/dL — ABNORMAL LOW (ref 8.9–10.3)
Chloride: 107 mmol/L (ref 98–111)
Chloride: 106 mmol/L (ref 98–111)
Creatinine, Ser: 0.74 mg/dL (ref 0.61–1.24)
Creatinine, Ser: 0.77 mg/dL (ref 0.61–1.24)
GFR, Estimated: >60 mL/min (ref >60)
GFR, Estimated: >60 mL/min (ref >60)
Glucose, Bld: 158 mg/dL — ABNORMAL HIGH (ref 70–99)
Glucose, Bld: 172 mg/dL — ABNORMAL HIGH (ref 70–99)
Potassium: 4.1 mmol/L (ref 3.5–5.1)
Potassium: 4 mmol/L (ref 3.5–5.1)
Sodium: 144 mmol/L (ref 135–145)
Sodium: 137 mmol/L (ref 135–145)
Total Bilirubin: 0.7 mg/dL (ref 0.3–1.2)
Total Bilirubin: 0.8 mg/dL (ref 0.3–1.2)
Total Protein: 5.9 g/dL — ABNORMAL LOW (ref 6.5–8.1)
Total Protein: 4.8 g/dL — ABNORMAL LOW (ref 6.5–8.1)

## 2021-10-09 LAB — HEPATITIS PANEL, ACUTE
HCV Ab: NONREACTIVE
Hep A IgM: NONREACTIVE
Hep B C IgM: NONREACTIVE
Hepatitis B Surface Ag: NONREACTIVE

## 2021-10-09 LAB — PROTIME-INR
INR: 1 (ref 0.8–1.2)
Prothrombin Time: 13.3 seconds (ref 11.4–15.2)

## 2021-10-09 LAB — RESP PANEL BY RT-PCR (FLU A&B, COVID) ARPGX2
Influenza A by PCR: NEGATIVE
Influenza B by PCR: NEGATIVE
SARS Coronavirus 2 by RT PCR: NEGATIVE

## 2021-10-09 LAB — ACETAMINOPHEN LEVEL: Acetaminophen (Tylenol), Serum: <10 ug/mL — ABNORMAL LOW (ref 10–30)

## 2021-10-09 LAB — ETHANOL: Alcohol, Ethyl (B): 37 mg/dL — ABNORMAL HIGH (ref <10)

## 2021-10-09 LAB — LIPASE, BLOOD: Lipase: 19 U/L (ref 11–51)

## 2021-10-09 MED ORDER — PANCRELIPASE (LIP-PROT-AMYL) 36000-114000 UNITS PO CPEP
36000.0000 [IU] | ORAL_CAPSULE | Freq: Three times a day (TID) | ORAL | Status: DC
  Administered 2021-10-09 – 2021-10-14 (×13): 36000 [IU] via ORAL
  Filled 2021-10-09 (×15): qty 1

## 2021-10-09 MED ORDER — THIAMINE HCL 100 MG/ML IJ SOLN
100.0000 mg | Freq: Every day | INTRAMUSCULAR | Status: DC
  Administered 2021-10-09 – 2021-10-10 (×2): 100 mg via INTRAVENOUS
  Filled 2021-10-09 (×2): qty 2

## 2021-10-09 MED ORDER — SODIUM CHLORIDE 0.9% FLUSH
3.0000 mL | Freq: Two times a day (BID) | INTRAVENOUS | Status: DC
  Administered 2021-10-09 – 2021-10-14 (×6): 3 mL via INTRAVENOUS

## 2021-10-09 MED ORDER — SODIUM CHLORIDE 0.9 % IV BOLUS (SEPSIS)
1000.0000 mL | Freq: Once | INTRAVENOUS | Status: AC
  Administered 2021-10-09: 1000 mL via INTRAVENOUS

## 2021-10-09 MED ORDER — ONDANSETRON HCL 4 MG/2ML IJ SOLN
4.0000 mg | Freq: Once | INTRAMUSCULAR | Status: DC
  Filled 2021-10-09: qty 2

## 2021-10-09 MED ORDER — SODIUM CHLORIDE 0.9 % IV SOLN
1000.0000 mL | INTRAVENOUS | Status: DC
  Administered 2021-10-09 – 2021-10-11 (×7): 1000 mL via INTRAVENOUS

## 2021-10-09 MED ORDER — PANTOPRAZOLE SODIUM 40 MG PO TBEC
40.0000 mg | DELAYED_RELEASE_TABLET | Freq: Two times a day (BID) | ORAL | Status: DC
  Administered 2021-10-09 – 2021-10-11 (×5): 40 mg via ORAL
  Filled 2021-10-09 (×5): qty 1

## 2021-10-09 MED ORDER — SODIUM CHLORIDE 0.9 % IV SOLN
12.5000 mg | Freq: Four times a day (QID) | INTRAVENOUS | Status: DC | PRN
  Administered 2021-10-09 – 2021-10-13 (×3): 12.5 mg via INTRAVENOUS
  Filled 2021-10-09 (×4): qty 0.5

## 2021-10-09 MED ORDER — IBUPROFEN 400 MG PO TABS
400.0000 mg | ORAL_TABLET | Freq: Four times a day (QID) | ORAL | Status: DC | PRN
  Administered 2021-10-09 – 2021-10-11 (×3): 400 mg via ORAL
  Filled 2021-10-09 (×3): qty 1

## 2021-10-09 MED ORDER — PANCRELIPASE (LIP-PROT-AMYL) 12000-38000 UNITS PO CPEP
12000.0000 [IU] | ORAL_CAPSULE | Freq: Two times a day (BID) | ORAL | Status: DC | PRN
  Administered 2021-10-11 – 2021-10-13 (×3): 12000 [IU] via ORAL
  Filled 2021-10-09 (×6): qty 1

## 2021-10-09 MED ORDER — ENSURE ENLIVE PO LIQD: 237.0000 mL | Freq: Two times a day (BID) | ORAL | Status: DC

## 2021-10-09 NOTE — H&P (Signed)
Date: 10/09/2021               Patient Name:  Brent Rogers MRN: [id number]  DOB: 1966-04-14 Age / Sex: 56 y.o., male   PCP: Ladell Pier, MD         Medical Service: Internal Medicine Teaching Service         Attending Physician: Dr. Velna Ochs, MD    First Contact: Mitzie Na, MD Pager: MB [phone]  Second Contact: Gaylan Gerold, DO Pager: Liliane Shi [phone]       After Hours (After 5p/  First Contact Pager: [phone]  weekends / holidays): Second Contact Pager: [phone]   SUBJECTIVE  Chief Complaint: nausea/vomiting  History of Present Illness: Brent Rogers is a 56 y.o. male with a pertinent PMH of hypertension, chronic pancreatitis on Creon status post Whipple procedure, recent admission for GI bleed, iron deficiency anemia, protein calorie malnutrition, who presents to [facility] with nausea vomiting for approximately 1 week.  Mr. Brent Rogers reports that he has not been feeling very well since his discharge on December 31.  He notes that he has barely been able to keep anything down with recurrent nausea and vomiting approximately 4-5 times daily. He endorses not being able to keep any of his medications down.  He reports mild abdominal pain that is mostly left-sided.  He reports associated weakness, lightheadedness/dizziness and is having trouble even standing.  He notes that he fell at home yesterday and hit his left shoulder and left side of chest on the coffee table.  Denies any head trauma.  Patient denies any fevers/chills, worsening abdominal pain, hematemesis, chest pain, shortness of breath, changes in bowel habits, melena or hematochezia. Patient was recently admitted 12/28-12/31 with melena and dark tarry stools.  He was noted to have acute on chronic iron deficiency anemia.  Patient evaluated with EGD and was noted to have esophagitis and nonbleeding ulcers.  He was treated with IV Protonix and discharged on Protonix 40 mg twice daily for 8 weeks followed by 40 mg  daily long-term and Carafate with meals and at bedtime.  Patient's Creon dosing was also increased at this hospitalization and he was started on protein supplements with meals.  Medications: No current facility-administered medications on file prior to encounter.   Current Outpatient Medications on File Prior to Encounter  Medication Sig Dispense Refill   amLODipine (NORVASC) 5 MG tablet Take 1 tablet (5 mg total) by mouth daily. 90 tablet 2   ferrous sulfate 325 (65 FE) MG tablet Take 1 tablet (325 mg total) by mouth daily. (Patient taking differently: Take 325 mg by mouth every other day.) 100 tablet 1   lipase/protease/amylase (CREON) 36000 UNITS CPEP capsule Take 1 capsule (36,000 Units total) by mouth with breakfast, with lunch, and with evening meal. 180 capsule 0   ondansetron (ZOFRAN-ODT) 8 MG disintegrating tablet Take 1 tablet (8 mg total) by mouth every 8 (eight) hours as needed. (Patient taking differently: Take 8 mg by mouth every 8 (eight) hours as needed for nausea or vomiting.) 8 tablet 0   promethazine (PHENERGAN) 25 MG tablet Take 1 tablet (25 mg total) by mouth every 6 (six) hours as needed for nausea or vomiting. 30 tablet 0   famotidine (PEPCID) 20 MG tablet Take 1 tablet (20 mg total) by mouth 2 (two) times daily. (Patient not taking: Reported on 09/25/2021) 60 tablet 0   lipase/protease/amylase (CREON) 12000-38000 units CPEP capsule Take 1 capsule (12,000 Units total) by mouth 2 (  two) times daily as needed (with snacks). (Patient not taking: Reported on 10/09/2021) 270 capsule 0   mirtazapine (REMERON) 15 MG tablet Take 1 tablet (15 mg total) by mouth at bedtime. (Patient not taking: Reported on 09/09/2021) 30 tablet 0   Multiple Vitamin (MULTIVITAMIN WITH MINERALS) TABS tablet Take 1 tablet by mouth daily. (Patient not taking: Reported on 10/09/2021) 30 tablet 0   pantoprazole (PROTONIX) 40 MG tablet Take 1 tablet (40 mg total) by mouth 2 (two) times daily. (Patient not taking:  Reported on 10/09/2021) 112 tablet 0   sucralfate (CARAFATE) 1 GM/10ML suspension Take 10 mLs (1 g total) by mouth 4 (four) times daily -  with meals and at bedtime. (Patient not taking: Reported on 09/09/2021) 1200 mL 0   venlafaxine XR (EFFEXOR-XR) 75 MG 24 hr capsule Take 1 capsule (75 mg total) by mouth daily with breakfast. (Patient not taking: Reported on 09/09/2021) 30 capsule 0    Past Medical History:  Past Medical History:  Diagnosis Date   Benign tumor of endocrine pancreas    Chronic pancreatitis ([place])    S/P Whipple   Foot ulcer with fat layer exposed ([place])    Hypertension    Migraine    "@ least once/month" (11/12/2015)   Pancreatic abnormality    CT  shows mass   Pneumonia 11/12/2015   Scoliosis     Social:  Patient lives alone.  He is currently disabled but does have family in the area patient lives alone but does have family in the area.  He is independent in his ADLs and IADLs.  Patient is currently disabled.  He follows with Karle Plumber, MD for his PCP.  He endorses occasional alcohol use with last use being approximately 3 days ago.  He does also endorse occasional tobacco use.  Family History: Family History  Problem Relation Age of Onset   Cancer Mother        unsure   COPD Mother    Hypertension Mother    Hypertension Father    COPD Father    CAD Sister        possible has stent per pt    Allergies: Allergies as of 10/09/2021   (No Known Allergies)    Review of Systems: A complete ROS was negative except as per HPI.   OBJECTIVE:  Physical Exam: Blood pressure (!) 119/95, pulse 76, temperature 98.8 F (37.1 C), resp. rate 12, height 6\' 4"  (1.93 m), weight 56 kg, SpO2 100 %. Physical Exam  Constitutional: Chronically ill and cachectic appearing middle-aged male.  Appears older than stated age.  No acute distress HENT: Normocephalic and atraumatic, anicteric sclera, pale conjunctiva, dry mucous membranes Cardiovascular: Normal rate, regular  rhythm, S1 and S2 present, no murmurs, rubs, gallops.  Distal pulses intact Respiratory: No respiratory distress, Lungs are clear to auscultation bilaterally. GI: Nondistended, soft, mild tenderness to palpation in epigastric region, no hepatosplenomegaly., normal bowel sounds Musculoskeletal: Decreased muscle bulk, normal tone.  No peripheral edema noted. Neurological: Is alert and oriented x4, 5/5 strength in bilateral upper extremities, 5/5 in RLE, 4/5 in LLE Skin: Warm and dry.  Poor skin turgor no rash, erythema, lesions noted. Psychiatric: Normal mood and affect. Behavior is normal.    Pertinent Labs: CBC    Component Value Date/Time   WBC 5.6 10/09/2021 0151   RBC 3.87 (L) 10/09/2021 0151   HGB 11.6 (L) 10/09/2021 0151   HGB 10.9 (L) 03/21/2021 1115   HCT 35.1 (L) 10/09/2021 0151  HCT 32.6 (L) 03/21/2021 1115   PLT 276 10/09/2021 0151   PLT 295 03/21/2021 1115   MCV 90.7 10/09/2021 0151   MCV 81 03/21/2021 1115   MCH 30.0 10/09/2021 0151   MCHC 33.0 10/09/2021 0151   RDW 15.1 10/09/2021 0151   RDW 17.9 (H) 03/21/2021 1115   LYMPHSABS 2.1 10/09/2021 0151   MONOABS 0.2 10/09/2021 0151   EOSABS 0.0 10/09/2021 0151   BASOSABS 0.1 10/09/2021 0151     CMP     Component Value Date/Time   NA 144 10/09/2021 0151   NA 140 06/22/2020 1648   K 4.1 10/09/2021 0151   CL 107 10/09/2021 0151   CO2 23 10/09/2021 0151   GLUCOSE 158 (H) 10/09/2021 0151   BUN <5 (L) 10/09/2021 0151   BUN 4 (L) 06/22/2020 1648   CREATININE 0.74 10/09/2021 0151   CREATININE 0.91 10/15/2015 1517   CALCIUM 8.4 (L) 10/09/2021 0151   PROT 5.9 (L) 10/09/2021 0151   PROT 6.2 06/22/2020 1648   ALBUMIN 2.7 (L) 10/09/2021 0151   ALBUMIN 3.5 (L) 06/22/2020 1648   AST 1,904 (H) 10/09/2021 0151   ALT 522 (H) 10/09/2021 0151   ALKPHOS 286 (H) 10/09/2021 0151   BILITOT 0.7 10/09/2021 0151   BILITOT 0.2 06/22/2020 1648   GFRNONAA >60 10/09/2021 0151   GFRNONAA >89 10/15/2015 1517   GFRAA 128 06/22/2020  1648   GFRAA >89 10/15/2015 1517    Pertinent Imaging: DG Chest 2 View  Result Date: 10/09/2021 CLINICAL DATA:  Chest pain. EXAM: CHEST - 2 VIEW COMPARISON:  Chest radiograph dated 09/09/2021. FINDINGS: The heart size and mediastinal contours are within normal limits. Both lungs are clear. The visualized skeletal structures are unremarkable. IMPRESSION: No active cardiopulmonary disease. Electronically Signed   By: Anner Crete M.D.   On: 10/09/2021 02:15   US Abdomen Limited RUQ (LIVER/GB)  Result Date: 10/09/2021 CLINICAL DATA:  56 year old male with history of elevated liver function tests. EXAM: ULTRASOUND ABDOMEN LIMITED RIGHT UPPER QUADRANT COMPARISON:  No prior abdominal ultrasound. CT the abdomen and pelvis 09/25/2021. FINDINGS: Gallbladder: Status post cholecystectomy. Common bile duct: Diameter: 2.9 mm Liver: Hyperechoic lesion in the left lobe of the liver with macrolobulated borders measuring 3.2 x 2.6 x 1.5 cm, which appears to correspond with area of probable focal fatty infiltration in this region on recent CT examination. Otherwise within normal limits in parenchymal echogenicity. Portal vein is patent on color Doppler imaging with normal direction of blood flow towards the liver. Other: None. IMPRESSION: 1. No acute findings. 2. Echogenic structure in the left lobe of the liver favored to represent an area of focal fatty infiltration given appearance on recent CT examination, which was also similar to remote prior CT study from 08/13/2021. This could be definitively characterized with nonemergent abdominal MRI with and without IV gadolinium if of clinical concern. 3. Status post cholecystectomy. Electronically Signed   By: Vinnie Langton M.D.   On: 10/09/2021 07:15    EKG: personally reviewed my interpretation is normal EKG, normal sinus rhythm, unchanged from previous tracings  ASSESSMENT & PLAN:  Assessment: Principal Problem:   Acute hepatitis   Brent Rogers is a 56  y.o. with pertinent PMH of hypertension, chronic pancreatitis on Creon status post Whipple procedure, recent admission for GI bleed, iron deficiency anemia, protein calorie malnutrition who presented with nausea and vomiting and admit for acute hepatitis on hospital day 0  Plan: #Acute hepatitis Patient is presenting with 1 week of nausea and  vomiting with mild upper abdominal pain.  Work-up noted for significant elevation in AST, ALT, alkaline phosphatase.  Total bilirubin and PT/INR wnl.  Right upper quadrant ultrasound with echogenic structure in the left lobe of liver suspected to be area of focal fatty infiltration when compared to prior CT findings.  No CBD abnormality.  Abdominal exam is benign.  Patient denies any recent Tylenol use and last alcohol use was approximately 3 days prior with 112 ounce can of beer.  EtOH levels were noted to be 37.  Given degree of elevation, suspect liver injury to be either viral or toxin mediated vs alcohol induced.  Patient's only medication change from prior admission was addition of Protonix and is very rare that Protonix would cause this significant elevation in LFTs.  Acetaminophen level less than 10.  Autoimmune hepatitis is also possible.  May also have possible ischemic hepatitis in setting of hypotension from dehydration although suspect that this is less likely. -GGT level, LDH -Follow-up hepatitis panel -Trend LFTs and PT/INR -If no significant improvement, further work-up for autoimmune hepatitis and MRI abdomen with and without contrast   #Protein calorie malnutrition #Chronic pancreatic insufficiency #Physical deconditioning  Patient with history of chronic pancreatic insufficiency for which she is on Creon supplementation.  He has had significant weight loss of up to 30 pounds in past 6 months.  BMI 15 with albumin 2.7. Reports feeling lightheaded/dizzy in setting of dehydration - IV fluid hydration -Creon supplementation -Consult to RD  -  PT/OT eval   #Iron deficiency anemia #Recent GI bleed Patient with history of iron deficiency anemia for which she is on iron supplementation.  He notes that he has not able to tolerate his oral medications recently.  He did have a recent admission for GI bleed and noted to have esophagitis and nonbleeding anastomic ulcer with gastritis. Biopsy negative for H pyloria. Patient denies any further melena at this time. Hemoglobin currently at baseline. - Resume home protonix 40mg  bid - Trend CBC  #Hypertension  Patient is on amlodipine at home for hypertension. BP's soft on presentation in setting of dehydration. - Holding home amlodipine   Best Practice: Diet: Regular diet IVF: Fluids: 0.9NS, Rate:  125 cc/hr x 10 hrs VTE: SCDs Start: 10/09/21 0948 Code: Full AB: None Status: Inpatient with expected length of stay greater than 2 midnights. Anticipated Discharge Location: Home Barriers to Discharge: Medical stability  Signature: Harvie Heck, MD Internal Medicine Resident, PGY-3 [facility]  Pager: [phone] 10:05 AM, 10/09/2021   Please contact the on call pager after 5 pm and on weekends at [phone].

## 2021-10-09 NOTE — ED Provider Notes (Signed)
[facility] EMERGENCY DEPARTMENT Provider Note   CSN: [id number] Arrival date & time: 10/09/21  0131     History  Chief Complaint  Patient presents with   Vomiting    Brent Rogers is a 56 y.o. male.  HPI  Patient has a complex history of multiple GI issues including chronic pancreatitis, GI bleeding as well as history of hypertension and pneumonia.  Pt was recently admitted to the hospital on December 28 and discharged on December 31 for upper GI bleeding felt to be related to esophagitis.  Patient states he started having trouble with recurrent nausea and vomiting about a week ago.  He has been vomiting about 4-5 times daily.  He denies any recurrent blood in his emesis or stool.  In the last couple of days however he has become weaker and is having trouble even standing.  Patient states he has not been able to keep down his medications including down.  Patient indicates having some mild abdominal pain but not severe.  Denies any chest pain currently but has been having some pain in his chest.  Patient states every now and then..  Not having any fevers or shortness of breath  Patient does drink alcohol occasionally nothing in the last 4 to 5 days.  He also does smoke. Home Medications Prior to Admission medications   Medication Sig Start Date End Date Taking? Authorizing Provider  amLODipine (NORVASC) 5 MG tablet Take 1 tablet (5 mg total) by mouth daily. 03/21/21   Ladell Pier, MD  famotidine (PEPCID) 20 MG tablet Take 1 tablet (20 mg total) by mouth 2 (two) times daily. Patient not taking: Reported on 09/25/2021 09/06/21 10/06/21  France Ravens, MD  ferrous sulfate 325 (65 FE) MG tablet Take 1 tablet (325 mg total) by mouth daily. Patient taking differently: Take 325 mg by mouth every other day. 03/16/21   Ladell Pier, MD  lipase/protease/amylase (CREON) 12000-38000 units CPEP capsule Take 1 capsule (12,000 Units total) by mouth 2 (two) times daily as  needed (with snacks). 09/27/21   Delene Ruffini, MD  lipase/protease/amylase (CREON) 36000 UNITS CPEP capsule Take 1 capsule (36,000 Units total) by mouth with breakfast, with lunch, and with evening meal. 09/27/21   Delene Ruffini, MD  mirtazapine (REMERON) 15 MG tablet Take 1 tablet (15 mg total) by mouth at bedtime. Patient not taking: Reported on 09/09/2021 09/06/21 10/06/21  France Ravens, MD  Multiple Vitamin (MULTIVITAMIN WITH MINERALS) TABS tablet Take 1 tablet by mouth daily. 09/28/21 10/28/21  Delene Ruffini, MD  ondansetron (ZOFRAN-ODT) 8 MG disintegrating tablet Take 1 tablet (8 mg total) by mouth every 8 (eight) hours as needed. Patient taking differently: Take 8 mg by mouth every 8 (eight) hours as needed for nausea or vomiting. 09/09/21   Ripley Fraise, MD  pantoprazole (PROTONIX) 40 MG tablet Take 1 tablet (40 mg total) by mouth 2 (two) times daily. 09/27/21 11/26/21  Delene Ruffini, MD  promethazine (PHENERGAN) 25 MG tablet Take 1 tablet (25 mg total) by mouth every 6 (six) hours as needed for nausea or vomiting. 09/09/21   Ripley Fraise, MD  sucralfate (CARAFATE) 1 GM/10ML suspension Take 10 mLs (1 g total) by mouth 4 (four) times daily -  with meals and at bedtime. Patient not taking: Reported on 09/09/2021 09/06/21 10/06/21  France Ravens, MD  venlafaxine XR (EFFEXOR-XR) 75 MG 24 hr capsule Take 1 capsule (75 mg total) by mouth daily with breakfast. Patient not taking: Reported on 09/09/2021 09/06/21  10/06/21  France Ravens, MD      Allergies    Patient has no known allergies.    Review of Systems   Review of Systems  Physical Exam Updated Vital Signs BP (!) 119/95 (BP Location: Right Arm)    Pulse 76    Temp 98.8 F (37.1 C)    Resp 12    Ht 1.93 m ($Remov'6\' 4"'xZLsxl$ )    Wt 56 kg    SpO2 100%    BMI 15.03 kg/m  Physical Exam Vitals and nursing note reviewed.  Constitutional:      Appearance: He is well-developed. He is ill-appearing.     Comments: Underweight  HENT:     Head:  Normocephalic and atraumatic.     Right Ear: External ear normal.     Left Ear: External ear normal.  Eyes:     General: No scleral icterus.       Right eye: No discharge.        Left eye: No discharge.     Conjunctiva/sclera: Conjunctivae normal.  Neck:     Trachea: No tracheal deviation.  Cardiovascular:     Rate and Rhythm: Normal rate and regular rhythm.  Pulmonary:     Effort: Pulmonary effort is normal. No respiratory distress.     Breath sounds: Normal breath sounds. No stridor. No wheezing or rales.  Abdominal:     General: Bowel sounds are normal. There is no distension.     Palpations: Abdomen is soft.     Tenderness: There is no abdominal tenderness. There is no guarding or rebound.  Musculoskeletal:        General: No tenderness or deformity.     Cervical back: Neck supple.  Skin:    General: Skin is warm and dry.     Findings: No rash.  Neurological:     General: No focal deficit present.     Mental Status: He is alert.     Cranial Nerves: No cranial nerve deficit (no facial droop, extraocular movements intact, no slurred speech).     Sensory: No sensory deficit.     Motor: Weakness (Generalized) present. No abnormal muscle tone or seizure activity.     Coordination: Coordination normal.  Psychiatric:        Mood and Affect: Mood normal.    ED Results / Procedures / Treatments   Labs (all labs ordered are listed, but only abnormal results are displayed) Labs Reviewed  COMPREHENSIVE METABOLIC PANEL - Abnormal; Notable for the following components:      Result Value   Glucose, Bld 158 (*)    BUN <5 (*)    Calcium 8.4 (*)    Total Protein 5.9 (*)    Albumin 2.7 (*)    AST 1,904 (*)    ALT 522 (*)    Alkaline Phosphatase 286 (*)    All other components within normal limits  CBC WITH DIFFERENTIAL/PLATELET - Abnormal; Notable for the following components:   RBC 3.87 (*)    Hemoglobin 11.6 (*)    HCT 35.1 (*)    All other components within normal limits   TROPONIN I (HIGH SENSITIVITY) - Abnormal; Notable for the following components:   Troponin I (High Sensitivity) 42 (*)    All other components within normal limits  TROPONIN I (HIGH SENSITIVITY) - Abnormal; Notable for the following components:   Troponin I (High Sensitivity) 28 (*)    All other components within normal limits  RESP PANEL BY RT-PCR (FLU  A&B, COVID) ARPGX2  LIPASE, BLOOD  PROTIME-INR  ACETAMINOPHEN LEVEL  HEPATITIS PANEL, ACUTE    EKG EKG Interpretation  Date/Time:  Wednesday October 09 2021 08:35:07 EST Ventricular Rate:  88 PR Interval:  150 QRS Duration: 88 QT Interval:  362 QTC Calculation: 438 R Axis:   75 Text Interpretation: Sinus rhythm RAE, consider biatrial enlargement Confirmed by Dorie Rank [phone]) on 10/09/2021 8:40:32 AM  Radiology DG Chest 2 View  Result Date: 10/09/2021 CLINICAL DATA:  Chest pain. EXAM: CHEST - 2 VIEW COMPARISON:  Chest radiograph dated 09/09/2021. FINDINGS: The heart size and mediastinal contours are within normal limits. Both lungs are clear. The visualized skeletal structures are unremarkable. IMPRESSION: No active cardiopulmonary disease. Electronically Signed   By: Anner Crete M.D.   On: 10/09/2021 02:15   US Abdomen Limited RUQ (LIVER/GB)  Result Date: 10/09/2021 CLINICAL DATA:  56 year old male with history of elevated liver function tests. EXAM: ULTRASOUND ABDOMEN LIMITED RIGHT UPPER QUADRANT COMPARISON:  No prior abdominal ultrasound. CT the abdomen and pelvis 09/25/2021. FINDINGS: Gallbladder: Status post cholecystectomy. Common bile duct: Diameter: 2.9 mm Liver: Hyperechoic lesion in the left lobe of the liver with macrolobulated borders measuring 3.2 x 2.6 x 1.5 cm, which appears to correspond with area of probable focal fatty infiltration in this region on recent CT examination. Otherwise within normal limits in parenchymal echogenicity. Portal vein is patent on color Doppler imaging with normal direction of blood  flow towards the liver. Other: None. IMPRESSION: 1. No acute findings. 2. Echogenic structure in the left lobe of the liver favored to represent an area of focal fatty infiltration given appearance on recent CT examination, which was also similar to remote prior CT study from 08/13/2021. This could be definitively characterized with nonemergent abdominal MRI with and without IV gadolinium if of clinical concern. 3. Status post cholecystectomy. Electronically Signed   By: Vinnie Langton M.D.   On: 10/09/2021 07:15    Procedures Procedures    Medications Ordered in ED Medications  sodium chloride 0.9 % bolus 1,000 mL (has no administration in time range)    Followed by  0.9 %  sodium chloride infusion (has no administration in time range)  ondansetron (ZOFRAN) injection 4 mg (has no administration in time range)    ED Course/ Medical Decision Making/ A&P Clinical Course as of 10/09/21 0907  Wed Oct 09, 2021  0822 Comprehensive metabolic panel(!) AST ALT alk phos elevated, new compared to previous values [JK]  0823 Troponin I (High Sensitivity)(!) Troponin levels are elevated but decreasing  [JK]  0823 CBC with Differential(!) Hemoglobin improved compared to previous [JK]  0823 DG Chest 2 View Chest x-ray images and report reviewed.  No acute findings [JK]  0824 US Abdomen Limited RUQ (LIVER/GB) Ultrasound shows persistent area of probable fatty infiltration.  Follow-up MRI recommended [JK]  0906 Dw IM service.  Will admit for further treatment [JK]    Clinical Course User Index [JK] Dorie Rank, MD           HEART Score: 3                Medical Decision Making  Patient was to the ED for evaluation of nausea and vomiting.  Patient has history of complex medical problems.  He has chronic pancreatitis and has had significant weight loss as a result.  Patient presented with recurrent vomiting but fortunately no signs of acute GI bleeding at this time.  He does appear dehydrated.  His  labs  are notable for an acute hepatitis.  New compared to previous values.  No signs of any biliary obstruction on ultrasound.  Follow-up MRI recommended though because of an abnormal area noted on the ultrasound.  Patient also has been having some atypical chest discomfort.  Moderate risk heart score.  Troponins are elevated but decreasing.  Doubt ACS at this time.  Patient has been given IV fluids and antiemetics.  I will consult the medical service for admission and further treatment.        Final Clinical Impression(s) / ED Diagnoses Final diagnoses:  Elevated LFTs  Acute hepatitis  Dehydration  Elevated troponin     Dorie Rank, MD 10/09/21 [phone]

## 2021-10-09 NOTE — ED Notes (Signed)
Attempted IV twice with no success. Pt also needs blood draw. IV team consult placed.

## 2021-10-09 NOTE — Plan of Care (Signed)
  Problem: Education: Goal: Knowledge of General Education information will improve Description: Including pain rating scale, medication(s)/side effects and non-pharmacologic comfort measures Outcome: Progressing   Problem: Activity: Goal: Risk for activity intolerance will decrease Outcome: Progressing   

## 2021-10-09 NOTE — ED Provider Triage Note (Addendum)
Emergency Medicine Provider Triage Evaluation Note  Brent Rogers , a 56 y.o. male  was evaluated in triage.  Pt complains of nausea, vomiting, diarrhea for the past 2 days.  Patient complains of mild intermittent upper abdominal pain.  Denies any hematemesis or hematochezia.  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  Also notes a history of pancreatitis.  Denies any recent alcohol use.  States he has not drink regularly.  Also notes intermittent chest pain.  No shortness of breath.    Physical Exam  BP (!) 119/100 (BP Location: Left Arm)    Pulse (!) 125    Temp 98.2 F (36.8 C)    Resp (!) 22    Ht 6\' 4"  (1.93 m)    Wt 56 kg    SpO2 96%    BMI 15.03 kg/m  Gen:   Awake, no distress   Resp:  Normal effort  MSK:   Moves extremities without difficulty  Other:  Abdomen is flat and soft. Mild TTP overlying the epigastrium.   Medical Decision Making  Medically screening exam initiated at 1:44 AM.  Appropriate orders placed.  Brent Rogers was informed that the remainder of the evaluation will be completed by another provider, this initial triage assessment does not replace that evaluation, and the importance of remaining in the ED until their evaluation is complete.   Rayna Sexton, PA-C 10/09/21 0145    Rayna Sexton, PA-C 10/09/21 [phone]

## 2021-10-09 NOTE — ED Triage Notes (Signed)
Pt BIB [facility] for eval of N/V x 2 days. Hx of pancreatitis w/ new dx of ulcers. Pt denies any chest or abd pain. Pt denies ETOH.

## 2021-10-09 NOTE — ED Notes (Signed)
ED TO INPATIENT HANDOFF REPORT  ED Nurse Name and Phone #: Wyvonnia Lora [phone]  S Name/Age/Gender Brent Rogers 56 y.o. male Room/Bed: 443X/540G  Code Status   Code Status: Full Code  Home/SNF/Other Home Patient oriented to: self, place, time, and situation Is this baseline? Yes   Triage Complete: Triage complete  Chief Complaint Acute hepatitis [B17.9]  Triage Note Pt BIB [facility] for eval of N/V x 2 days. Hx of pancreatitis w/ new dx of ulcers. Pt denies any chest or abd pain. Pt denies ETOH.    Allergies No Known Allergies  Level of Care/Admitting Diagnosis ED Disposition     ED Disposition  Admit   Condition  --   Comment  Hospital Area: [place] [[id number]]  Level of Care: Med-Surg [16]  May admit patient to [facility] or [facility] if equivalent level of care is available:: No  Covid Evaluation: Asymptomatic Screening Protocol (No Symptoms)  Diagnosis: Acute hepatitis [229772]  Admitting Physician: Velna Ochs [[id number]]  Attending Physician: Velna Ochs [[id number]]  Estimated length of stay: past midnight tomorrow  Certification:: I certify this patient will need inpatient services for at least 2 midnights          B Medical/Surgery History Past Medical History:  Diagnosis Date   Benign tumor of endocrine pancreas    Chronic pancreatitis ([place])    S/P Whipple   Foot ulcer with fat layer exposed ([place])    Hypertension    Migraine    "@ least once/month" (11/12/2015)   Pancreatic abnormality    CT  shows mass   Pneumonia 11/12/2015   Scoliosis    Past Surgical History:  Procedure Laterality Date   BACK SURGERY     BIOPSY  09/26/2021   Procedure: BIOPSY;  Surgeon: Ladene Artist, MD;  Location: [facility] ENDOSCOPY;  Service: Endoscopy;;   COLONOSCOPY WITH PROPOFOL N/A 03/11/2021   Procedure: COLONOSCOPY WITH PROPOFOL;  Surgeon: Carol Ada, MD;  Location: [place];  Service: Endoscopy;  Laterality: N/A;    ESOPHAGOGASTRODUODENOSCOPY (EGD) WITH PROPOFOL N/A 03/11/2021   Procedure: ESOPHAGOGASTRODUODENOSCOPY (EGD) WITH PROPOFOL;  Surgeon: Carol Ada, MD;  Location: [place];  Service: Endoscopy;  Laterality: N/A;   ESOPHAGOGASTRODUODENOSCOPY (EGD) WITH PROPOFOL N/A 09/26/2021   Procedure: ESOPHAGOGASTRODUODENOSCOPY (EGD) WITH PROPOFOL;  Surgeon: Ladene Artist, MD;  Location[facility] END[facility]COPY;  Service: Endoscopy;  Laterality: N/A;   EUS N/A 09/21/2013   Procedure: ESOPHAGEAL ENDOSCOPIC ULTRASOUND (EUS) RADIAL;  Surgeon: Beryle Beams, MD;  Location: [facility] ENDOSCOPY;  Service: Endoscopy;  Laterality: N/A;   EUS N/A 09/30/2013   Procedure: UPPER ENDOSCOPIC ULTRASOUND (EUS) LINEAR;  Surgeon: Beryle Beams, MD;  Location: [facility] ENDOSCOPY;  Service: Endoscopy;  Laterality: N/A;   FINE NEEDLE ASPIRATION N/A 09/21/2013   Procedure: FINE NEEDLE ASPIRATION (FNA) LINEAR;  Surgeon: Beryle Beams, MD;  Location: [facility] ENDOSCOPY;  Service: Endoscopy;  Laterality: N/A;   FINGER FRACTURE SURGERY Left 1995   5th digit   FRACTURE SURGERY     HEMOSTASIS CLIP PLACEMENT  03/11/2021   Procedure: HEMOSTASIS CLIP PLACEMENT;  Surgeon: Carol Ada, MD;  Location: [place];  Service: Endoscopy;;   HEMOSTASIS CONTROL  03/11/2021   Procedure: HEMOSTASIS CONTROL;  Surgeon: Carol Ada, MD;  Location: [place];  Service: Endoscopy;;   KNEE ARTHROSCOPY Bilateral 1987-1989   right-left   LAPAROSCOPY N/A 12/01/2013   Procedure: LAPAROSCOPY DIAGNOSTIC PANCREATICODUODENECTOMY WITH BILIARY AND PANCREATIC STENTS;  Surgeon: Stark Klein, MD;  Location: [facility];  Service: General;  Laterality: N/A;   LUMBAR DISC SURGERY  1990's   SAVORY DILATION N/A 03/11/2021   Procedure: SAVORY DILATION;  Surgeon: Carol Ada, MD;  Location: [place];  Service: Endoscopy;  Laterality: N/A;   WHIPPLE PROCEDURE  12/01/2013     A IV Location/Drains/Wounds Patient Lines/Drains/Airways Status     Active Line/Drains/Airways     Name  Placement date Placement time Site Days   Peripheral IV 10/09/21 22 G 1.75" Anterior;Left;Distal Forearm 10/09/21  0956  Forearm  less than 1   Pressure Injury 09/25/21 Coccyx Medial Stage 1 -  Intact skin with non-blanchable redness of a localized area usually over a bony prominence. healed pressure ulcer 09/25/21  2320  -- 14            Intake/Output Last 24 hours  Intake/Output Summary (Last 24 hours) at 10/09/2021 1717 Last data filed at 10/09/2021 1209 Gross per 24 hour  Intake 1050 ml  Output --  Net 1050 ml    Labs/Imaging Results for orders placed or performed during the hospital encounter of 10/09/21 (from the past 48 hour(s))  Comprehensive metabolic panel     Status: Abnormal   Collection Time: 10/09/21  1:51 AM  Result Value Ref Range   Sodium 144 135 - 145 mmol/L   Potassium 4.1 3.5 - 5.1 mmol/L   Chloride 107 98 - 111 mmol/L   CO2 23 22 - 32 mmol/L   Glucose, Bld 158 (H) 70 - 99 mg/dL    Comment: Glucose reference range applies only to samples taken after fasting for at least 8 hours.   BUN <5 (L) 6 - 20 mg/dL   Creatinine, Ser 0.74 0.61 - 1.24 mg/dL   Calcium 8.4 (L) 8.9 - 10.3 mg/dL   Total Protein 5.9 (L) 6.5 - 8.1 g/dL   Albumin 2.7 (L) 3.5 - 5.0 g/dL   AST 1,904 (H) 15 - 41 U/L   ALT 522 (H) 0 - 44 U/L   Alkaline Phosphatase 286 (H) 38 - 126 U/L   Total Bilirubin 0.7 0.3 - 1.2 mg/dL   GFR, Estimated >60 >60 mL/min    Comment: (NOTE) Calculated using the CKD-EPI Creatinine Equation (2021)    Anion gap 14 5 - 15    Comment: Performed at [facility], [place] [address] [place], [place] [address]  CBC with Differential     Status: Abnormal   Collection Time: 10/09/21  1:51 AM  Result Value Ref Range   WBC 5.6 4.0 - 10.5 K/uL   RBC 3.87 (L) 4.22 - 5.81 MIL/uL   Hemoglobin 11.6 (L) 13.0 - 17.0 g/dL   HCT 35.1 (L) 39.0 - 52.0 %   MCV 90.7 80.0 - 100.0 fL   MCH 30.0 26.0 - 34.0 pg   MCHC 33.0 30.0 - 36.0 g/dL   RDW 15.1 11.5 - 15.5 %   Platelets  276 150 - 400 K/uL   nRBC 0.0 0.0 - 0.2 %   Neutrophils Relative % 58 %   Neutro Abs 3.2 1.7 - 7.7 K/uL   Lymphocytes Relative 37 %   Lymphs Abs 2.1 0.7 - 4.0 K/uL   Monocytes Relative 3 %   Monocytes Absolute 0.2 0.1 - 1.0 K/uL   Eosinophils Relative 1 %   Eosinophils Absolute 0.0 0.0 - 0.5 K/uL   Basophils Relative 1 %   Basophils Absolute 0.1 0.0 - 0.1 K/uL   Immature Granulocytes 0 %   Abs Immature Granulocytes 0.02 0.00 - 0.07 K/uL  Comment: Performed at [facility], [place] [address] [place], [place] [address]  Lipase, blood     Status: None   Collection Time: 10/09/21  1:51 AM  Result Value Ref Range   Lipase 19 11 - 51 U/L    Comment: Performed at [place] [address] [place], [place] [address]  Troponin I (High Sensitivity)     Status: Abnormal   Collection Time: 10/09/21  1:51 AM  Result Value Ref Range   Troponin I (High Sensitivity) 42 (H) <18 ng/L    Comment: (NOTE) Elevated high sensitivity troponin I (hsTnI) values and significant  changes across serial measurements may suggest ACS but many other  chronic and acute conditions are known to elevate hsTnI results.  Refer to the Links section for chest pain algorithms and additional  guidance. Performed at [facility], [place] [address], [place], [place] [address]   Resp Panel by RT-PCR (Flu A&B, Covid) Nasopharyngeal Swab     Status: None   Collection Time: 10/09/21  2:10 AM   Specimen: Nasopharyngeal Swab; Nasopharyngeal(NP) swabs in vial transport medium  Result Value Ref Range   SARS Coronavirus 2 by RT PCR NEGATIVE NEGATIVE    Comment: (NOTE) SARS-CoV-2 target nucleic acids are NOT DETECTED.  The SARS-CoV-2 RNA is generally detectable in upper respiratory specimens during the acute phase of infection. The lowest concentration of SARS-CoV-2 viral copies this assay can detect is 138 copies/mL. A negative result does not preclude SARS-Cov-2 infection and should not be used as the  sole basis for treatment or other patient management decisions. A negative result may occur with  improper specimen collection/handling, submission of specimen other than nasopharyngeal swab, presence of viral mutation(s) within the areas targeted by this assay, and inadequate number of viral copies(<138 copies/mL). A negative result must be combined with clinical observations, patient history, and epidemiological information. The expected result is Negative.  Fact Sheet for Patients:  EntrepreneurPulse.com.au  Fact Sheet for Healthcare Providers:  IncredibleEmployment.be  This test is no t yet approved or cleared by the [place] FDA and  has been authorized for detection and/or diagnosis of SARS-CoV-2 by FDA under an Emergency Use Authorization (EUA). This EUA will remain  in effect (meaning this test can be used) for the duration of the COVID-19 declaration under Section 564(b)(1) of the Act, 21 U.S.C.section 360bbb-3(b)(1), unless the authorization is terminated  or revoked sooner.       Influenza A by PCR NEGATIVE NEGATIVE   Influenza B by PCR NEGATIVE NEGATIVE    Comment: (NOTE) The Xpert Xpress SARS-CoV-2/FLU/RSV plus assay is intended as an aid in the diagnosis of influenza from Nasopharyngeal swab specimens and should not be used as a sole basis for treatment. Nasal washings and aspirates are unacceptable for Xpert Xpress SARS-CoV-2/FLU/RSV testing.  Fact Sheet for Patients: EntrepreneurPulse.com.au  Fact Sheet for Healthcare Providers: IncredibleEmployment.be  This test is not yet approved or cleared by the [place] FDA and has been authorized for detection and/or diagnosis of SARS-CoV-2 by FDA under an Emergency Use Authorization (EUA). This EUA will remain in effect (meaning this test can be used) for the duration of the COVID-19 declaration under Section 564(b)(1) of the Act, 21  U.S.C. section 360bbb-3(b)(1), unless the authorization is terminated or revoked.  Performed at [facility], [place] [address] [place], [place] [address]   Troponin I (High Sensitivity)     Status: Abnormal   Collection Time: 10/09/21  4:51  AM  Result Value Ref Range   Troponin I (High Sensitivity) 28 (H) <18 ng/L    Comment: (NOTE) Elevated high sensitivity troponin I (hsTnI) values and significant  changes across serial measurements may suggest ACS but many other  chronic and acute conditions are known to elevate hsTnI results.  Refer to the "Links" section for chest pain algorithms and additional  guidance. Performed at [facility], [place] [address] [place], [place] [address]   Protime-INR     Status: None   Collection Time: 10/09/21  8:42 AM  Result Value Ref Range   Prothrombin Time 13.3 11.4 - 15.2 seconds   INR 1.0 0.8 - 1.2    Comment: (NOTE) INR goal varies based on device and disease states. Performed at [facility], [place] [address] [place], [place] [address]   Acetaminophen level     Status: Abnormal   Collection Time: 10/09/21  8:42 AM  Result Value Ref Range   Acetaminophen (Tylenol), Serum <10 (L) 10 - 30 ug/mL    Comment: (NOTE) Therapeutic concentrations vary significantly. A range of 10-30 ug/mL  may be an effective concentration for many patients. However, some  are best treated at concentrations outside of this range. Acetaminophen concentrations >150 ug/mL at 4 hours after ingestion  and >50 ug/mL at 12 hours after ingestion are often associated with  toxic reactions.  Performed at [facility], [place] [address] [place], [place] [address]   Hepatitis panel, acute     Status: None   Collection Time: 10/09/21  8:42 AM  Result Value Ref Range   Hepatitis B Surface Ag NON REACTIVE NON REACTIVE   HCV Ab NON REACTIVE NON REACTIVE    Comment: (NOTE) Nonreactive HCV antibody screen is consistent with no HCV infections,  unless  recent infection is suspected or other evidence exists to indicate HCV infection.     Hep A IgM NON REACTIVE NON REACTIVE   Hep B C IgM NON REACTIVE NON REACTIVE    Comment: Performed at [facility], [place] [address] [place], [place] [address]  Ethanol     Status: Abnormal   Collection Time: 10/09/21  8:42 AM  Result Value Ref Range   Alcohol, Ethyl (B) 37 (H) <10 mg/dL    Comment: (NOTE) Lowest detectable limit for serum alcohol is 10 mg/dL.  For medical purposes only. Performed at [facility], [place] [address], [place], [place] [address]    DG Chest 2 View  Result Date: 10/09/2021 CLINICAL DATA:  Chest pain. EXAM: CHEST - 2 VIEW COMPARISON:  Chest radiograph dated 09/09/2021. FINDINGS: The heart size and mediastinal contours are within normal limits. Both lungs are clear. The visualized skeletal structures are unremarkable. IMPRESSION: No active cardiopulmonary disease. Electronically Signed   By: Anner Crete M.D.   On: 10/09/2021 02:15   US Abdomen Limited RUQ (LIVER/GB)  Result Date: 10/09/2021 CLINICAL DATA:  55 year old male with history of elevated liver function tests. EXAM: ULTRASOUND ABDOMEN LIMITED RIGHT UPPER QUADRANT COMPARISON:  No prior abdominal ultrasound. CT the abdomen and pelvis 09/25/2021. FINDINGS: Gallbladder: Status post cholecystectomy. Common bile duct: Diameter: 2.9 mm Liver: Hyperechoic lesion in the left lobe of the liver with macrolobulated borders measuring 3.2 x 2.6 x 1.5 cm, which appears to correspond with area of probable focal fatty infiltration in this region on recent CT examination. Otherwise within normal limits in parenchymal echogenicity. Portal vein is patent on color Doppler imaging with normal direction  of blood flow towards the liver. Other: None. IMPRESSION: 1. No acute findings. 2. Echogenic structure in the left lobe of the liver favored to represent an area of focal fatty infiltration given appearance on recent CT examination,  which was also similar to remote prior CT study from 08/13/2021. This could be definitively characterized with nonemergent abdominal MRI with and without IV gadolinium if of clinical concern. 3. Status post cholecystectomy. Electronically Signed   By: Vinnie Langton M.D.   On: 10/09/2021 07:15    Pending Labs Unresulted Labs (From admission, onward)     Start     Ordered   10/10/21 0500  Comprehensive metabolic panel  Tomorrow morning,   R        10/09/21 0948   10/10/21 0500  CBC  Tomorrow morning,   R        10/09/21 0948   10/10/21 0500  Protime-INR  Tomorrow morning,   R        10/09/21 0948   10/10/21 0500  Gamma GT  Tomorrow morning,   R        10/09/21 1115   10/09/21 1452  Comprehensive metabolic panel  Once,   R        10/09/21 1451            Vitals/Pain Today's Vitals   10/09/21 1145 10/09/21 1245 10/09/21 1615 10/09/21 1700  BP: 117/90 117/85 (!) 106/91 110/87  Pulse: 93 94 81 (!) 113  Resp: 17 15 16 15   Temp:      TempSrc:      SpO2: 98%  100%   Weight:      Height:      PainSc:        Isolation Precautions No active isolations  Medications Medications  sodium chloride 0.9 % bolus 1,000 mL (0 mLs Intravenous Stopped 10/09/21 1055)    Followed by  0.9 %  sodium chloride infusion (1,000 mLs Intravenous New Bag/Given 10/09/21 1057)  sodium chloride flush (NS) 0.9 % injection 3 mL (3 mLs Intravenous Given 10/09/21 1056)  ibuprofen (ADVIL) tablet 400 mg (has no administration in time range)  thiamine (B-1) injection 100 mg (100 mg Intravenous Given 10/09/21 1056)  promethazine (PHENERGAN) 12.5 mg in sodium chloride 0.9 % 50 mL IVPB (0 mg Intravenous Stopped 10/09/21 1209)  lipase/protease/amylase (CREON) capsule 36,000 Units (36,000 Units Oral Given 10/09/21 1112)  lipase/protease/amylase (CREON) capsule 12,000 Units (has no administration in time range)  pantoprazole (PROTONIX) EC tablet 40 mg (40 mg Oral Given 10/09/21 1056)    Mobility walks      R Recommendations: See Admitting Provider Note  Report given to: Penni Bombard   Additional Notes:

## 2021-10-10 DIAGNOSIS — K8689 Other specified diseases of pancreas: Secondary | ICD-10-CM

## 2021-10-10 DIAGNOSIS — E46 Unspecified protein-calorie malnutrition: Secondary | ICD-10-CM | POA: Diagnosis not present

## 2021-10-10 DIAGNOSIS — I10 Essential (primary) hypertension: Secondary | ICD-10-CM | POA: Diagnosis not present

## 2021-10-10 DIAGNOSIS — K7010 Alcoholic hepatitis without ascites: Principal | ICD-10-CM

## 2021-10-10 LAB — COMPREHENSIVE METABOLIC PANEL
ALT: 274 U/L — ABNORMAL HIGH (ref 0–44)
AST: 512 U/L — ABNORMAL HIGH (ref 15–41)
Albumin: 2 g/dL — ABNORMAL LOW (ref 3.5–5.0)
Alkaline Phosphatase: 190 U/L — ABNORMAL HIGH (ref 38–126)
Anion gap: 9 (ref 5–15)
BUN: <5 mg/dL — ABNORMAL LOW (ref 6–20)
CO2: 23 mmol/L (ref 22–32)
Calcium: 7.6 mg/dL — ABNORMAL LOW (ref 8.9–10.3)
Chloride: 108 mmol/L (ref 98–111)
Creatinine, Ser: 0.8 mg/dL (ref 0.61–1.24)
GFR, Estimated: >60 mL/min (ref >60)
Glucose, Bld: 88 mg/dL (ref 70–99)
Potassium: 3.7 mmol/L (ref 3.5–5.1)
Sodium: 140 mmol/L (ref 135–145)
Total Bilirubin: 0.7 mg/dL (ref 0.3–1.2)
Total Protein: 4.3 g/dL — ABNORMAL LOW (ref 6.5–8.1)

## 2021-10-10 LAB — CBC
HCT: 24.6 % — ABNORMAL LOW (ref 39.0–52.0)
Hemoglobin: 8.2 g/dL — ABNORMAL LOW (ref 13.0–17.0)
MCH: 30.4 pg (ref 26.0–34.0)
MCHC: 33.3 g/dL (ref 30.0–36.0)
MCV: 91.1 fL (ref 80.0–100.0)
Platelets: 148 10*3/uL — ABNORMAL LOW (ref 150–400)
RBC: 2.7 MIL/uL — ABNORMAL LOW (ref 4.22–5.81)
RDW: 15.4 % (ref 11.5–15.5)
WBC: 8.4 10*3/uL (ref 4.0–10.5)
nRBC: 0 % (ref 0.0–0.2)

## 2021-10-10 LAB — PROTIME-INR
INR: 1.1 (ref 0.8–1.2)
Prothrombin Time: 13.9 seconds (ref 11.4–15.2)

## 2021-10-10 LAB — GAMMA GT: GGT: 347 U/L — ABNORMAL HIGH (ref 7–50)

## 2021-10-10 MED ORDER — ALUM & MAG HYDROXIDE-SIMETH 200-200-20 MG/5ML PO SUSP
30.0000 mL | Freq: Four times a day (QID) | ORAL | Status: DC | PRN
  Administered 2021-10-10 – 2021-10-11 (×2): 30 mL via ORAL
  Filled 2021-10-10 (×2): qty 30

## 2021-10-10 MED ORDER — BOOST / RESOURCE BREEZE PO LIQD CUSTOM
1.0000 | Freq: Three times a day (TID) | ORAL | Status: DC
  Administered 2021-10-14: 1 via ORAL

## 2021-10-10 MED ORDER — ADULT MULTIVITAMIN W/MINERALS CH
1.0000 | ORAL_TABLET | Freq: Every day | ORAL | Status: DC
  Administered 2021-10-10 – 2021-10-14 (×5): 1 via ORAL
  Filled 2021-10-10 (×5): qty 1

## 2021-10-10 MED ORDER — THIAMINE HCL 100 MG PO TABS
100.0000 mg | ORAL_TABLET | Freq: Every day | ORAL | Status: DC
  Administered 2021-10-11 – 2021-10-14 (×4): 100 mg via ORAL
  Filled 2021-10-10 (×4): qty 1

## 2021-10-10 NOTE — Progress Notes (Signed)
Subjective:  ON- None  Patient assessed at bedside this AM. He reports that he vomited once overnight. He was able to eat a small amount of his food yesterday and is currently eating breakfast. He continues to feel dizzy if he sits up to quickly. He has had one BM overnight. No blood noted in BM. He states that he doesn't drink alcohol often and does not believe it is a problem for him. He has tried quitting alcohol in the past. He does not have other concerns at this time.  Objective:  Vital signs in last 24 hours: Vitals:   10/09/21 2028 10/10/21 0349 10/10/21 0430 10/10/21 0548  BP: 110/74 114/80  111/77  Pulse: 67 75  74  Resp: 18 18    Temp: 98.4 F (36.9 C) 98.6 F (37 C)  98.3 F (36.8 C)  TempSrc: Oral Oral  Oral  SpO2: 100% 100%  100%  Weight:   58.2 kg   Height:       Constitutional: Chronically ill and cachectic appearing middle-aged male.  Appears older than stated age.  No acute distress HENT: Normocephalic and atraumatic, anicteric sclera, pale conjunctiva, dry mucous membranes Cardiovascular: Normal rate, regular rhythm, S1 and S2 present, no murmurs, rubs, gallops.  Respiratory: No respiratory distress, Lungs are clear to auscultation bilaterally. GI: Nondistended, soft, tenderness to palpation in the LUQ, no hepatosplenomegaly, normal bowel sounds Musculoskeletal: Decreased muscle bulk, normal tone. No peripheral edema noted. Neurological: Is alert and oriented x4, no focal deficits appreciated Skin: Warm and dry. Psychiatric: Normal mood and affect. Behavior is normal.   Assessment/Plan:  Principal Problem:   Acute hepatitis  Brent Rogers is a 56 y.o. with pertinent PMH of hypertension, chronic pancreatitis on Creon status post Whipple procedure, recent admission for GI bleed, iron deficiency anemia, protein calorie malnutrition who presented with nausea and vomiting and admitted for acute hepatitis on hospital day 1  #Acute alcoholic hepatitis   Patient presented here with nausea/vomiting and work-up demonstrating significant elevations in AST, ALT, and alkaline phosphatase. Total bilirubin and PT/INR wnl.  Ethanol levels were noted to be 37.  Given h/o alcohol use addressed during his prior admission for GI bleed, h/o malnutrition, current pattern of liver enzymes, AST>ALT by ratio of 2:1, elevated GGT, and his detectable alcohol levels on admission, most likely alcoholic hepatitis.  Other less likely items on the differential include ischemic hepatitis in the setting of hypotension from dehydration, though his liver enzyme patterns seem more consistent with alcoholic hepatitis.  Autoimmune hepatitis also less likely given liver enzyme patterns and given that the patient has improved since admission. We are continuing to trend his LFTs, which have been downtrending since admission. The patient does report that he is tolerating some p.o. intake but has had some emesis recently. We have counseled the patient that his alcohol use is likely responsible for his presentation here. Otherwise, we will ensure that patient is able to tolerate p.o. intake prior to discharge.   -Regular diet, continue to monitor his PO intake -Trend LFTs and PT/INR   #Protein calorie malnutrition #Chronic pancreatic insufficiency #Physical deconditioning  Patient with history of chronic pancreatic insufficiency for which she is on Creon supplementation.  He has had significant weight loss of up to 30 pounds in past 6 months.  BMI 15 with albumin 2.7. S/p fluid resuscitation in the ED. Reports feeling lightheaded/dizzy in setting of dehydration, unsure if this has improved overall since he has not yet stood up and walked  around in the hospital. - Orthostatics pending - Creon supplementation - Consult to RD  - PT/OT eval    #Iron deficiency anemia #Recent GI bleed Patient with history of iron deficiency anemia for which she is on iron supplementation.  He notes that he  has not able to tolerate his oral medications recently.  He did have a recent admission for GI bleed and noted to have esophagitis and nonbleeding anastomic ulcer with gastritis. Biopsy negative for H pylori. Patient denies any further melena at this time. Hemoglobin has decreased to 8.2 from 11.6 yesterday. Could be d/t fluid resuscitation he received, but we will need to continue to monitor this closely. - Continue home protonix 40mg  bid - Trend CBC   #Hypertension  Patient is on amlodipine at home for hypertension. BP's continue to be soft in the setting of dehydration. - Holding home amlodipine    Prior to Admission Living Arrangement: Home Anticipated Discharge Location: Home Barriers to Discharge: Medical stability Dispo: Anticipated discharge in approximately 0-1 day(s).   Brent Brill, MD 10/10/2021, 7:29 AM Pager: [phone]  After 5pm on weekdays and 1pm on weekends: On Call pager [phone]

## 2021-10-10 NOTE — H&P (Shared)
Date: 10/10/2021               Patient Name:  Brent Rogers MRN: [id number]  DOB: 1966-07-06 Age / Sex: 56 y.o., male   PCP: Ladell Pier, MD         Medical Service: Internal Medicine Teaching Service         Attending Physician: Dr. Velna Ochs, MD    First Contact: Mitzie Na, MD Pager: MB [phone]  Second Contact: Gaylan Gerold, DO Pager: Liliane Shi [phone]       After Hours (After 5p/  First Contact Pager: [phone]  weekends / holidays): Second Contact Pager: [phone]   SUBJECTIVE  Chief Complaint: nausea/vomiting  History of Present Illness: [name] is a 56 y.o. male with a pertinent PMH of hypertension, chronic pancreatitis on Creon status post Whipple procedure, recent admission for GI bleed, iron deficiency anemia, protein calorie malnutrition, who presents to [facility] with nausea vomiting for approximately 1 week.  Patient was recently admitted 12/28-12/31 with melena and dark tarry stools.  He was noted to have acute on chronic iron deficiency anemia.  Patient evaluated with EGD and was noted to have esophagitis and nonbleeding ulcers.  He was treated with IV Protonix and discharged on Protonix 40 mg twice daily for 8 weeks followed by 40 mg daily long-term and Carafate with meals and at bedtime.  Patient's Creon dosing was also increased at this hospitalization and he was started on protein supplements with meals.  Mr. Daaron Dimarco reports that he has not been feeling very well since his discharge on December 31.  He notes that he has barely been able to keep anything down with recurrent nausea and vomiting approximately 4-5 times daily. He endorses not being able to keep any of his medications down.  He reports mild abdominal pain that is mostly left-sided.  He reports associated weakness, lightheadedness/dizziness and is having trouble even standing.  He notes that he fell at home yesterday and hit his left shoulder and left side of chest on the coffee table.   Denies any head trauma.  Patient denies any fevers/chills, worsening abdominal pain, hematemesis, chest pain, shortness of breath, changes in bowel habits, melena or hematochezia.  Medications: No current facility-administered medications on file prior to encounter.   Current Outpatient Medications on File Prior to Encounter  Medication Sig Dispense Refill   amLODipine (NORVASC) 5 MG tablet Take 1 tablet (5 mg total) by mouth daily. 90 tablet 2   ferrous sulfate 325 (65 FE) MG tablet Take 1 tablet (325 mg total) by mouth daily. (Patient taking differently: Take 325 mg by mouth every other day.) 100 tablet 1   lipase/protease/amylase (CREON) 36000 UNITS CPEP capsule Take 1 capsule (36,000 Units total) by mouth with breakfast, with lunch, and with evening meal. 180 capsule 0   ondansetron (ZOFRAN-ODT) 8 MG disintegrating tablet Take 1 tablet (8 mg total) by mouth every 8 (eight) hours as needed. (Patient taking differently: Take 8 mg by mouth every 8 (eight) hours as needed for nausea or vomiting.) 8 tablet 0   promethazine (PHENERGAN) 25 MG tablet Take 1 tablet (25 mg total) by mouth every 6 (six) hours as needed for nausea or vomiting. 30 tablet 0   famotidine (PEPCID) 20 MG tablet Take 1 tablet (20 mg total) by mouth 2 (two) times daily. (Patient not taking: Reported on 09/25/2021) 60 tablet 0   lipase/protease/amylase (CREON) 12000-38000 units CPEP capsule Take 1 capsule (12,000 Units total) by mouth  2 (two) times daily as needed (with snacks). (Patient not taking: Reported on 10/09/2021) 270 capsule 0   mirtazapine (REMERON) 15 MG tablet Take 1 tablet (15 mg total) by mouth at bedtime. (Patient not taking: Reported on 09/09/2021) 30 tablet 0   Multiple Vitamin (MULTIVITAMIN WITH MINERALS) TABS tablet Take 1 tablet by mouth daily. (Patient not taking: Reported on 10/09/2021) 30 tablet 0   pantoprazole (PROTONIX) 40 MG tablet Take 1 tablet (40 mg total) by mouth 2 (two) times daily. (Patient not  taking: Reported on 10/09/2021) 112 tablet 0   sucralfate (CARAFATE) 1 GM/10ML suspension Take 10 mLs (1 g total) by mouth 4 (four) times daily -  with meals and at bedtime. (Patient not taking: Reported on 09/09/2021) 1200 mL 0   venlafaxine XR (EFFEXOR-XR) 75 MG 24 hr capsule Take 1 capsule (75 mg total) by mouth daily with breakfast. (Patient not taking: Reported on 09/09/2021) 30 capsule 0    Past Medical History:  Past Medical History:  Diagnosis Date   Benign tumor of endocrine pancreas    Chronic pancreatitis ([place])    S/P Whipple   Foot ulcer with fat layer exposed ([place])    Hypertension    Migraine    "@ least once/month" (11/12/2015)   Pancreatic abnormality    CT  shows mass   Pneumonia 11/12/2015   Scoliosis     Social:  Patient lives alone.  He is currently disabled but does have family in the area patient lives alone but does have family in the area.  He is independent in his ADLs and IADLs.  He follows with Karle Plumber, MD for his PCP.  He endorses occasional alcohol use with last use being approximately 3 days ago.  He does also endorse occasional tobacco use.  Family History: Family History  Problem Relation Age of Onset   Cancer Mother        unsure   COPD Mother    Hypertension Mother    Hypertension Father    COPD Father    CAD Sister        possible has stent per pt    Allergies: Allergies as of 10/09/2021   (No Known Allergies)    Review of Systems: A complete ROS was negative except as per HPI.   OBJECTIVE:  Physical Exam: Blood pressure 111/77, pulse 74, temperature 98.3 F (36.8 C), temperature source Oral, resp. rate 18, height $RemoveBe'6\' 4"'VjKUYapcw$  (1.93 m), weight 58.2 kg, SpO2 100 %. Physical Exam  Constitutional: Chronically ill and cachectic appearing middle-aged male.  Appears older than stated age.  No acute distress HENT: Normocephalic and atraumatic, anicteric sclera, pale conjunctiva, dry mucous membranes Cardiovascular: Normal rate, regular  rhythm, S1 and S2 present, no murmurs, rubs, gallops.  Distal pulses intact Respiratory: No respiratory distress, Lungs are clear to auscultation bilaterally. GI: Nondistended, soft, mild tenderness to palpation in epigastric region, no hepatosplenomegaly., normal bowel sounds Musculoskeletal: Decreased muscle bulk, normal tone.  No peripheral edema noted. Neurological: Is alert and oriented x4, 5/5 strength in bilateral upper extremities, 5/5 in RLE, 4/5 in LLE Skin: Warm and dry.  Poor skin turgor no rash, erythema, lesions noted. Psychiatric: Normal mood and affect. Behavior is normal.    Hgb 11.7, WBC 5.6 AST 1904, ALT 522 Alk phos 286 Total bilirubin and PT/INR wnl.  Cr 0.74 Ethanol 37 Lipase WNL Trop 42-> 28 Acetaminophen unremarkable Hepatitis panel negative    Right upper quadrant ultrasound with echogenic structure in the left lobe of  liver suspected to be area of focal fatty infiltration when compared to prior CT findings. No CBD abnormality.    Pertinent Labs: CBC    Component Value Date/Time   WBC 8.4 10/10/2021 0050   RBC 2.70 (L) 10/10/2021 0050   HGB 8.2 (L) 10/10/2021 0050   HGB 10.9 (L) 03/21/2021 1115   HCT 24.6 (L) 10/10/2021 0050   HCT 32.6 (L) 03/21/2021 1115   PLT 148 (L) 10/10/2021 0050   PLT 295 03/21/2021 1115   MCV 91.1 10/10/2021 0050   MCV 81 03/21/2021 1115   MCH 30.4 10/10/2021 0050   MCHC 33.3 10/10/2021 0050   RDW 15.4 10/10/2021 0050   RDW 17.9 (H) 03/21/2021 1115   LYMPHSABS 2.1 10/09/2021 0151   MONOABS 0.2 10/09/2021 0151   EOSABS 0.0 10/09/2021 0151   BASOSABS 0.1 10/09/2021 0151     CMP     Component Value Date/Time   NA 140 10/10/2021 0050   NA 140 06/22/2020 1648   K 3.7 10/10/2021 0050   CL 108 10/10/2021 0050   CO2 23 10/10/2021 0050   GLUCOSE 88 10/10/2021 0050   BUN <5 (L) 10/10/2021 0050   BUN 4 (L) 06/22/2020 1648   CREATININE 0.80 10/10/2021 0050   CREATININE 0.91 10/15/2015 1517   CALCIUM 7.6 (L) 10/10/2021  0050   PROT 4.3 (L) 10/10/2021 0050   PROT 6.2 06/22/2020 1648   ALBUMIN 2.0 (L) 10/10/2021 0050   ALBUMIN 3.5 (L) 06/22/2020 1648   AST 512 (H) 10/10/2021 0050   ALT 274 (H) 10/10/2021 0050   ALKPHOS 190 (H) 10/10/2021 0050   BILITOT 0.7 10/10/2021 0050   BILITOT 0.2 06/22/2020 1648   GFRNONAA >60 10/10/2021 0050   GFRNONAA >89 10/15/2015 1517   GFRAA 128 06/22/2020 1648   GFRAA >89 10/15/2015 1517    Pertinent Imaging: No results found.  EKG: personally reviewed my interpretation is normal EKG, normal sinus rhythm, unchanged from previous tracings  ASSESSMENT & PLAN:  Assessment: Principal Problem:   Acute hepatitis   Brent Rogers is a 56 y.o. with pertinent PMH of hypertension, chronic pancreatitis on Creon status post Whipple procedure, recent admission for GI bleed, iron deficiency anemia, protein calorie malnutrition who presented with nausea and vomiting and admit for acute hepatitis on hospital day 1  Plan: #Acute hepatitis Patient is presenting with 1 week of nausea and vomiting with mild upper abdominal pain.  Work-up noted for significant elevation in AST, ALT, alkaline phosphatase.    Patient denies any recent Tylenol use and last alcohol use was approximately 3 days prior with 112 ounce can of beer.  EtOH levels were noted to be 37.  Given degree of elevation, suspect liver injury to be either viral or toxin mediated vs alcohol induced.  Patient's only medication change from prior admission was addition of Protonix and is very rare that Protonix would cause this significant elevation in LFTs.  Acetaminophen level less than 10.  Autoimmune hepatitis is also possible.  May also have possible ischemic hepatitis in setting of hypotension from dehydration although suspect that this is less likely.  -GGT level, LDH -Follow-up hepatitis panel -Trend LFTs and PT/INR -If no significant improvement, further work-up for autoimmune hepatitis and MRI abdomen with and without  contrast    ON NAE Afebrile and HDS CMP AST improved to 512, ALT 274 down from 522 Alk phos 190 GGT 347 PT 13.9, INR 1.1 CBC dropped 8.2 from 11.6 (maybe from fluids) -Autoimmune workup  #Protein calorie  malnutrition #Chronic pancreatic insufficiency #Physical deconditioning  Patient with history of chronic pancreatic insufficiency for which she is on Creon supplementation.  He has had significant weight loss of up to 30 pounds in past 6 months.  BMI 15 with albumin 2.7. Reports feeling lightheaded/dizzy in setting of dehydration - IV fluid hydration -Creon supplementation -Consult to RD  - PT/OT eval   #Iron deficiency anemia #Recent GI bleed Patient with history of iron deficiency anemia for which she is on iron supplementation.  He notes that he has not able to tolerate his oral medications recently.  He did have a recent admission for GI bleed and noted to have esophagitis and nonbleeding anastomic ulcer with gastritis. Biopsy negative for H pylori. Patient denies any further melena at this time. Hemoglobin currently at baseline. - Resume home protonix $RemoveBefor'40mg'[id number]$  bid - Trend CBC  #Hypertension  Patient is on amlodipine at home for hypertension. BP's soft on presentation in setting of dehydration. - Holding home amlodipine   Best Practice: Diet: Regular diet IVF: Fluids: 0.9NS, Rate:  125 cc/hr x 10 hrs VTE: SCDs Start: 10/09/21 0948 Code: Full AB: None Status: Inpatient with expected length of stay greater than 2 midnights. Anticipated Discharge Location: Home Barriers to Discharge: Medical stability  Signature: Harvie Heck, MD Internal Medicine Resident, PGY-3 [facility]  Pager: [phone] 7:31 AM, 10/10/2021   Please contact the on call pager after 5 pm and on weekends at [phone].

## 2021-10-10 NOTE — Progress Notes (Signed)
Initial Nutrition Assessment  DOCUMENTATION CODES:   Underweight, Severe malnutrition in context of chronic illness  INTERVENTION:   Encourage good PO intake Multivitamin w/ minerals daily Boost Breeze po TID, each supplement provides 250 kcal and 9 grams of protein  NUTRITION DIAGNOSIS:   Severe Malnutrition related to chronic illness as evidenced by severe muscle depletion, severe fat depletion.  GOAL:   Patient will meet greater than or equal to 90% of their needs  MONITOR:   PO intake, Supplement acceptance, Labs, Weight trends  REASON FOR ASSESSMENT:   Consult Assessment of nutrition requirement/status, Poor PO  ASSESSMENT:   56 y.o. male presented to the ED with ongoing nausea and vomiting. Pt recently admitted at the end of December for dark stools and was found to be anemic. PMH includes chronic pancreatitis s/p Whipple procedure, HTN, and GERD. Pt admitted with acute hepatitis.   Pt reports that he has been attempting to eat while here, states that he is having abdominal pain and reflux. Pt reports that he has not had any nausea or vomiting today, last episode was last night. Pt reports that at home PTA he was eating a lot of baked chicken and fish.  Per EMR, pt ate 100% of his breakfast on 01/12.  Pt does not report his UBW, but believes that he has lost more weight since his previous admission a few weeks prior. Pt unsure of how much weight he may have loss. Per EMR, pt has had a 17% weight loss within 3 months, this is clinically significant for time frame.   Pt declines Ensure, does not like milk products. RD grabbed Boost Breeze out of nourishment room for pt to try. RD will order and follow-up on preferences.   Medications reviewed and include: Creon, Protonix, Thiamine Labs reviewed:  - BUN <5   NUTRITION - FOCUSED PHYSICAL EXAM:  Flowsheet Row Most Recent Value  Orbital Region Severe depletion  Upper Arm Region Severe depletion  Thoracic and Lumbar  Region Severe depletion  Buccal Region Moderate depletion  Temple Region Moderate depletion  Clavicle Bone Region Severe depletion  Clavicle and Acromion Bone Region Severe depletion  Scapular Bone Region Severe depletion  Dorsal Hand Moderate depletion  Patellar Region Severe depletion  Anterior Thigh Region Severe depletion  Posterior Calf Region Severe depletion  Edema (RD Assessment) None  Hair Reviewed  Eyes Reviewed  Mouth Reviewed  Skin Reviewed  Nails Reviewed       Diet Order:   Diet Order             Diet regular Room service appropriate? Yes; Fluid consistency: Thin  Diet effective now                   EDUCATION NEEDS:   No education needs have been identified at this time  Skin:  Skin Assessment: Reviewed RN Assessment  Last BM:  01/11  Height:   Ht Readings from Last 1 Encounters:  10/09/21 6\' 4"  (1.93 m)    Weight:   Wt Readings from Last 1 Encounters:  10/10/21 58.2 kg    Ideal Body Weight:  91.8 kg  BMI:  Body mass index is 15.62 kg/m.  Estimated Nutritional Needs:   Kcal:  1800-2000  Protein:  85-100 grams  Fluid:  >/= 1.8 L    Daziyah Cogan Louie Casa, RD, LDN Clinical Dietitian See [facility] for contact information.

## 2021-10-10 NOTE — Progress Notes (Signed)
Mobility Specialist Progress Note:   10/10/21 1500  Mobility  Activity Ambulated in hall  Level of Assistance Contact guard assist, steadying assist  Assistive Device Other (Comment) (IV Pole)  Distance Ambulated (ft) 200 ft  Mobility Ambulated with assistance in hallway  Mobility Response Tolerated well  Mobility performed by Mobility specialist  $Mobility charge 1 Mobility   Pt c/o L side weakness today. Required contactG for safety d/t pt feeling "woozy". Pt x1 L knee buckling during session. Pt back in bed with bed alarm on.  Nelta Numbers Mobility Specialist  Phone [phone]

## 2021-10-11 ENCOUNTER — Ambulatory Visit: Payer: Medicaid Other | Attending: Internal Medicine | Admitting: Internal Medicine

## 2021-10-11 DIAGNOSIS — R112 Nausea with vomiting, unspecified: Secondary | ICD-10-CM

## 2021-10-11 DIAGNOSIS — R7989 Other specified abnormal findings of blood chemistry: Secondary | ICD-10-CM

## 2021-10-11 DIAGNOSIS — B179 Acute viral hepatitis, unspecified: Secondary | ICD-10-CM | POA: Diagnosis not present

## 2021-10-11 DIAGNOSIS — K259 Gastric ulcer, unspecified as acute or chronic, without hemorrhage or perforation: Secondary | ICD-10-CM | POA: Diagnosis not present

## 2021-10-11 LAB — CBC
HCT: 23.8 % — ABNORMAL LOW (ref 39.0–52.0)
Hemoglobin: 7.9 g/dL — ABNORMAL LOW (ref 13.0–17.0)
MCH: 30.6 pg (ref 26.0–34.0)
MCHC: 33.2 g/dL (ref 30.0–36.0)
MCV: 92.2 fL (ref 80.0–100.0)
Platelets: 115 10*3/uL — ABNORMAL LOW (ref 150–400)
RBC: 2.58 MIL/uL — ABNORMAL LOW (ref 4.22–5.81)
RDW: 15.2 % (ref 11.5–15.5)
WBC: 10.4 10*3/uL (ref 4.0–10.5)
nRBC: 0.2 % (ref 0.0–0.2)

## 2021-10-11 LAB — COMPREHENSIVE METABOLIC PANEL
ALT: 177 U/L — ABNORMAL HIGH (ref 0–44)
AST: 206 U/L — ABNORMAL HIGH (ref 15–41)
Albumin: 1.8 g/dL — ABNORMAL LOW (ref 3.5–5.0)
Alkaline Phosphatase: 205 U/L — ABNORMAL HIGH (ref 38–126)
Anion gap: 8 (ref 5–15)
BUN: 7 mg/dL (ref 6–20)
CO2: 24 mmol/L (ref 22–32)
Calcium: 7.4 mg/dL — ABNORMAL LOW (ref 8.9–10.3)
Chloride: 106 mmol/L (ref 98–111)
Creatinine, Ser: 0.63 mg/dL (ref 0.61–1.24)
GFR, Estimated: >60 mL/min (ref >60)
Glucose, Bld: 99 mg/dL (ref 70–99)
Potassium: 3.8 mmol/L (ref 3.5–5.1)
Sodium: 138 mmol/L (ref 135–145)
Total Bilirubin: 0.5 mg/dL (ref 0.3–1.2)
Total Protein: 4.1 g/dL — ABNORMAL LOW (ref 6.5–8.1)

## 2021-10-11 LAB — IRON AND TIBC
Iron: 60 ug/dL (ref 45–182)
Saturation Ratios: 50 % — ABNORMAL HIGH (ref 17.9–39.5)
TIBC: 120 ug/dL — ABNORMAL LOW (ref 250–450)
UIBC: 60 ug/dL

## 2021-10-11 LAB — VITAMIN B12: Vitamin B-12: 557 pg/mL (ref 180–914)

## 2021-10-11 LAB — RETICULOCYTES
Immature Retic Fract: 12.6 % (ref 2.3–15.9)
RBC.: 2.49 MIL/uL — ABNORMAL LOW (ref 4.22–5.81)
Retic Count, Absolute: 42.6 10*3/uL (ref 19.0–186.0)
Retic Ct Pct: 1.7 % (ref 0.4–3.1)

## 2021-10-11 LAB — FERRITIN: Ferritin: 332 ng/mL (ref 24–336)

## 2021-10-11 LAB — FOLATE: Folate: 13.4 ng/mL (ref >5.9)

## 2021-10-11 MED ORDER — PANTOPRAZOLE SODIUM 40 MG IV SOLR
40.0000 mg | Freq: Two times a day (BID) | INTRAVENOUS | Status: DC
  Administered 2021-10-11 – 2021-10-14 (×6): 40 mg via INTRAVENOUS
  Filled 2021-10-11 (×6): qty 40

## 2021-10-11 MED ORDER — METOCLOPRAMIDE HCL 5 MG/ML IJ SOLN
5.0000 mg | Freq: Three times a day (TID) | INTRAMUSCULAR | Status: AC
  Administered 2021-10-11 – 2021-10-12 (×4): 5 mg via INTRAVENOUS
  Filled 2021-10-11 (×4): qty 2

## 2021-10-11 MED ORDER — ONDANSETRON HCL 4 MG/2ML IJ SOLN
4.0000 mg | Freq: Three times a day (TID) | INTRAMUSCULAR | Status: AC
  Administered 2021-10-11 – 2021-10-12 (×3): 4 mg via INTRAVENOUS
  Filled 2021-10-11 (×3): qty 2

## 2021-10-11 MED ORDER — RAMELTEON 8 MG PO TABS
8.0000 mg | ORAL_TABLET | Freq: Every day | ORAL | Status: DC
  Administered 2021-10-11 – 2021-10-13 (×3): 8 mg via ORAL
  Filled 2021-10-11 (×4): qty 1

## 2021-10-11 MED ORDER — POLYETHYLENE GLYCOL 3350 17 G PO PACK
17.0000 g | PACK | Freq: Every day | ORAL | Status: DC
  Administered 2021-10-11 – 2021-10-12 (×2): 17 g via ORAL
  Filled 2021-10-11 (×2): qty 1

## 2021-10-11 NOTE — Hospital Course (Addendum)
#  Acute alcoholic hepatitis  Patient presented here with nausea/vomiting and work-up demonstrating significant elevations in AST, ALT, and alkaline phosphatase. Total bilirubin and PT/INR wnl.  Ethanol levels were noted to be 37.  Given h/o alcohol use addressed during his prior admission for GI bleed, h/o malnutrition, current pattern of liver enzymes, AST>ALT by ratio of 2:1, elevated GGT, and his detectable alcohol levels on admission, most likely alcoholic hepatitis.  Other less likely items on the differential include ischemic hepatitis in the setting of hypotension from dehydration, though his liver enzyme patterns seem more consistent with alcoholic hepatitis.  Autoimmune hepatitis also less likely given liver enzyme patterns and given that the patient has improved since admission. During his hospitalization, we continue to trend his LFTs, which down trended appropriately.  In the final days of his hospitalization, the patient was able to tolerate some p.o. intake with few episodes of emesis, which is unchanged for him. Counseled on alcohol use.***   #Protein calorie malnutrition #Chronic pancreatic insufficiency #Physical deconditioning  Patient with history of chronic pancreatic insufficiency for which he is on Creon supplementation.  He has had significant weight loss of up to 30 pounds in past 6 months.  BMI 15 with albumin of 1.8. Given significant malnutrition and downtrending albumin levels, do not feel it is safe to discharge the patient without further input from our GI colleagues.***Creon supplementation was continued throughout his hospitalization.  PT/OT evaluated the patient and recommended ***.  RD was consulted for their recommendations, which we followed during his hospitalization.   #Iron deficiency anemia #Recent GI bleed Patient with history of iron deficiency anemia for which she is on iron supplementation.  He notes that he has not able to tolerate his oral medications  recently.  He did have a recent admission for GI bleed and noted to have esophagitis and nonbleeding anastomic ulcer with gastritis. Biopsy negative for H pylori.  During this hospitalization, hemoglobin trended 11.6-> 8.2 -> 7.9.  Iron studies showed iron 60, ferritin 344, elevated TIBC, elevated sat ratios.  Vitamin B12 and folate were within normal limits.  However, patient denied any episodes of melena.  His home Protonix 40 mg twice daily was continued throughout his hospitalization, and his CBC was trended.  Patient was otherwise hemodynamically stable.***   #Hypertension  Patient is on amlodipine at home for hypertension. BP's continue to be soft in the setting of dehydration.  Therefore, amlodipine was held during his hospitalization.

## 2021-10-11 NOTE — Progress Notes (Signed)
Mobility Specialist Progress Note:   10/11/21 1200  Mobility  Activity Ambulated in hall  Level of Assistance Standby assist, set-up cues, supervision of patient - no hands on  Assistive Device Other (Comment) (IV Pole)  Distance Ambulated (ft) 220 ft  Mobility Out of bed for toileting;Ambulated with assistance in hallway  Mobility Response Tolerated well  Mobility performed by Mobility specialist  $Mobility charge 1 Mobility   Pt c/o generalized weakness throughout session. No overt LOB during ambulation. Pt back in bed with bed alarm on.   Nelta Numbers Mobility Specialist  Phone [phone]

## 2021-10-11 NOTE — Plan of Care (Signed)
°  Problem: Clinical Measurements: °Goal: Ability to maintain clinical measurements within normal limits will improve °Outcome: Progressing °  °Problem: Activity: °Goal: Risk for activity intolerance will decrease °Outcome: Progressing °  °Problem: Nutrition: °Goal: Adequate nutrition will be maintained °Outcome: Progressing °  °

## 2021-10-11 NOTE — Consult Note (Signed)
Consultation  Referring Provider:     Dr. Philipp Ovens Primary Care Physician:  Ladell Pier, MD Primary Gastroenterologist:       Althia Forts  Reason for Consultation:     Elevated LFTs, failure to thrive   Impression    Elevated LFTs with normal total bili in setting of malnutrition,ETOH use (level noted to be 37) pattern compatible with hepatocellular injury AST higher than ALT Patient has longstanding history of mildly elevated AST, probable fatty liver. No evidence of cirrhosis or biliary ductal dilatation on CT/AB [place]. Only outpatient medication Protonix/Iron, suspect more alcohol induced/fatty liver, cannot rule out autoimmune.  With such elevations can also see in shock liver if patient had extreme hypotension/fall prior to admission, admitting BP 106/84 AST 1904-->862-->512-->206 ALT 522-->333-->274--->177, down trending LFTS Alk phos 286-->251-->190--205 INR 1.1 MELD-Na score: 7  Discrimintant score: 4.6  There is not evidence of biliary obstruction based on ultrasound or labs.  Acute on chronic anemia WBC 10.4 HGB 7.9 MCV 92.2 Platelets 115 11.6  ? Accurate, on admission to 8.2 to 7.9, no elevation BUN/Cr Having some black stool, never started on carafate/PPI for gastric ulcer, negative H pylori  Nausea and vomiting EPI status post Whipple, on Creon With elevated liver function acutely on admission, downtrending, negative viral hep History of nonbleeding cratered gastric ulcer at the anastomosis from EGD 09/27/2021 negative H pylori-was not taking treatment outpatient Patient does have early satiety, can be component of gastroparesis.  Protein calorie malnutrition Replace nutirition, ensure, consider RD Creon dose 36,000 once daily, can increase to twice a day CT abdomen pelvis without acute explanation  Chronic pancreatic insufficiency status post Whipple for chronic pancreatitis Continue creon in patient Creon dose 36,000 once daily, can increase to  twice a day  Constipation  Less likely for acute GI bleed  Stop iron, and add on MiraLAX   Plan    -Will send for work-up for other causes of LFTs including labs for autoimmune hepatitis, primary biliary cholangitis,  alpha-1 antitrypsin, ceruloplasmin. Ferritin normal, acute viral hepatitis negative -There is no evidence of encephalopathy -INR shows no evidence of coagulopathy-continue to trend daily -Will continue to trend LFTs -Continue on pantoprazole 40 mg twice daily, switch to IV at this time for at least 2-3 days while nausea is getting controlled and add on Carafate - if any evidence of rebleeding can repeat EGD -Scheduled Zofran and Reglan IV at this time, can switch to PO in 1-2 days. -Trend H&H, transfuse less than 7 -Area on ultrasound corresponds to previous CT and most likely focal fatty infiltrate, can consider MRCP if above work up is negative.    Thank you for your kind consultation, we will continue to follow.          HPI:   Brent Rogers is a 56 y.o. male with known history of chronic pancreatitis status post Whipple procedure 2015 at [facility] due to chronic abdominal pain secondary to chronic pancreatitis.  Patient's had 14 ER visits in 2022.  Patient was last seen 09/27/2021 by Dr. Fuller Plan for melena, had EGD that showed mild distal esophagitis, 1 nonbleeding cratered gastric ulcer with pigmented material at the anastomosis of his gastric jejunostomy, 7 mm.  Placed on pantoprazole 40 mg twice daily for 8 weeks and Carafate.Creon dose also increased.   Patient states since discharge December 31 he has continued to have nausea and vomiting 4-5 times daily.  Unable to keep down medications, having left-sided abdominal pain. Has had  associated weakness and dizziness, trouble standing and ultimately failure to thrive.  Did have a fall yesterday at home left shoulder left-sided chest denies LOC.  Patient does have occasional alcohol use, last day approximate 3 days  ago Tobacco use.  Patient denies melena, hematochezia, hematemesis, fever or chills. Denies chest pain, shortness of breath.  AST 1904-->862-->512-->206 ALT 522-->333-->274--->177, down trending LFTS Alk phos 286-->251-->190--205 CT scan no biliary ductal dilatation but ultrasound did show echogenic structure left lobe suspected focal fatty infiltration compared to prior CTs. Total bilirubin and INR normal EtOH level 37 Denies recent alcohol use  ED course:  Previous GI work up:  AB [place] 10/11/21 1. No acute findings. 2. Echogenic structure in the left lobe of the liver favored to represent an area of focal fatty infiltration given appearance on recent CT examination, which was also similar to remote prior CT study from 08/13/2021. This could be definitively characterized with nonemergent abdominal MRI with and without IV gadolinium if of clinical concern. 3. Status post cholecystectomy.  Patient was seen 09/26/2021 in the hospital with [facility] for abdominal pain and melena.  Was having nausea and vomiting at that time Was also having some left lower abdominal and flank pain worsened with urination. Complaining of 30 pound weight loss over 6 months. He had alk phos elevation at 164 otherwise normal LFTs, hemoglobin 10.7 trending down 8.7.  EGD 09/26/21 - LA Class A esophagitis. - Small hiatal hernia - Gastrojejunostomy was found, characterized by healthy appearing mucosa. - Non-bleeding anastomic ulcer with pigmented material. - Gastritis. Biopsied.  09/25/21 CT AB IMPRESSION: 1. No acute findings in the abdomen or pelvis. 2. Status post Whipple pancreaticoduodenectomy. 3.  Aortic Atherosclerosis (ICD10-I70.0).  EGD and colonoscopy June 2022 per Dr. Benson Norway when he was admitted with anemia and iron deficiency. 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  Past Medical History:  Diagnosis Date   Benign tumor of endocrine pancreas    Chronic pancreatitis ([place])    S/P Whipple   Foot ulcer with fat layer exposed ([place])    Hypertension    Migraine    "@ least once/month" (11/12/2015)   Pancreatic abnormality    CT  shows mass   Pneumonia 11/12/2015   Scoliosis     Surgical History:  He  has a past surgical history that includes Lumbar disc surgery (1990's); Finger fracture surgery (Left, 1995); EUS (N/A, 09/21/2013); Fine needle aspiration (N/A, 09/21/2013); EUS (N/A, 09/30/2013); laparoscopy (N/A, 12/01/2013); Whipple procedure (12/01/2013); Fracture surgery; Back surgery; Knee arthroscopy (Bilateral, [id number]); Colonoscopy with propofol (N/A, 03/11/2021); Esophagogastroduodenoscopy (egd) with propofol (N/A, 03/11/2021); Savory dilation (N/A, 03/11/2021); Hemostasis clip placement (03/11/2021); Hemostasis control (03/11/2021); Esophagogastroduodenoscopy (egd) with propofol (N/A, 09/26/2021); and biopsy (09/26/2021). Family History:  His family history includes CAD in his sister; COPD in his father and mother; Cancer in his mother; Hypertension in his father and mother. Social History:   reports that he has been smoking cigarettes. He has been smoking an average of 1 pack per day. He has quit using smokeless tobacco.  His smokeless tobacco use included snuff. He reports current alcohol use of about 2.0 standard drinks per week. He reports that he does not use drugs.  Prior to Admission medications   Medication Sig Start Date End Date Taking? Authorizing Provider  amLODipine (NORVASC) 5 MG  tablet Take 1 tablet (5 mg total) by mouth daily. 03/21/21  Yes Ladell Pier, MD  ferrous sulfate 325 (65 FE) MG tablet Take 1 tablet (325 mg total) by mouth daily. Patient taking differently: Take 325 mg by mouth every other day. 03/16/21  Yes Ladell Pier, MD  lipase/protease/amylase (CREON) 36000 UNITS CPEP capsule Take 1 capsule (36,000 Units total) by mouth with breakfast, with lunch, and with evening meal. 09/27/21  Yes Delene Ruffini, MD  ondansetron (ZOFRAN-ODT) 8 MG disintegrating tablet Take 1 tablet (8 mg total) by mouth every 8 (eight) hours as needed. Patient taking differently: Take 8 mg by mouth every 8 (eight) hours as needed for nausea or vomiting. 09/09/21  Yes Ripley Fraise, MD  promethazine (PHENERGAN) 25 MG tablet Take 1 tablet (25 mg total) by mouth every 6 (six) hours as needed for nausea or vomiting. 09/09/21  Yes Ripley Fraise, MD  famotidine (PEPCID) 20 MG tablet Take 1 tablet (20 mg total) by mouth 2 (two) times daily. Patient not taking: Reported on 09/25/2021 09/06/21 10/06/21  France Ravens, MD  lipase/protease/amylase (CREON) 12000-38000 units CPEP capsule Take 1 capsule (12,000 Units total) by mouth 2 (two) times daily as needed (with snacks). Patient not taking: Reported on 10/09/2021 09/27/21   Delene Ruffini, MD  mirtazapine (REMERON) 15 MG tablet Take 1 tablet (15 mg total) by mouth at bedtime. Patient not taking: Reported on 09/09/2021 09/06/21 10/06/21  France Ravens, MD  Multiple Vitamin (MULTIVITAMIN WITH MINERALS) TABS tablet Take 1 tablet by mouth daily. Patient not taking: Reported on 10/09/2021 09/28/21 10/28/21  Delene Ruffini, MD  pantoprazole (PROTONIX) 40 MG tablet Take 1 tablet (40 mg total) by mouth 2 (two) times daily. Patient not taking: Reported on 10/09/2021 09/27/21 11/26/21  Delene Ruffini, MD  sucralfate (CARAFATE) 1 GM/10ML suspension Take 10 mLs (1 g total) by mouth 4 (four) times daily -  with meals and at bedtime. Patient not taking: Reported on 09/09/2021 09/06/21 10/06/21  France Ravens, MD  venlafaxine XR (EFFEXOR-XR) 75 MG 24 hr capsule Take 1 capsule (75 mg total) by mouth daily with breakfast. Patient not taking: Reported on 09/09/2021 09/06/21 10/06/21  France Ravens, MD    Current  Facility-Administered Medications  Medication Dose Route Frequency Provider Last Rate Last Admin   0.9 %  sodium chloride infusion  1,000 mL Intravenous Continuous Dorie Rank, MD 125 mL/hr at 10/11/21 0350 1,000 mL at 10/11/21 0350   alum & mag hydroxide-simeth (MAALOX/MYLANTA) 200-200-20 MG/5ML suspension 30 mL  30 mL Oral Q6H PRN Velna Ochs, MD   30 mL at 10/11/21 0355   feeding supplement (BOOST / RESOURCE BREEZE) liquid 1 Container  1 Container Oral TID BM Velna Ochs, MD       ibuprofen (ADVIL) tablet 400 mg  400 mg Oral Q6H PRN Harvie Heck, MD   400 mg at 10/10/21 3382   lipase/protease/amylase (CREON) capsule 12,000 Units  12,000 Units Oral BID PRN Harvie Heck, MD       lipase/protease/amylase (CREON) capsule 36,000 Units  36,000 Units Oral TID with meals Harvie Heck, MD   36,000 Units at 10/11/21 0837   multivitamin with minerals tablet 1 tablet  1 tablet Oral Daily Velna Ochs, MD   1 tablet at 10/11/21 0837   pantoprazole (PROTONIX) EC tablet 40 mg  40 mg Oral BID Harvie Heck, MD   40 mg at 10/11/21 0837   promethazine (PHENERGAN) 12.5 mg in sodium chloride 0.9 % 50 mL IVPB  12.5 mg  Intravenous Q6H PRN Harvie Heck, MD 200 mL/hr at 10/11/21 0350 12.5 mg at 10/11/21 0350   sodium chloride flush (NS) 0.9 % injection 3 mL  3 mL Intravenous Q12H Harvie Heck, MD   3 mL at 10/10/21 4709   thiamine tablet 100 mg  100 mg Oral Daily Velna Ochs, MD   100 mg at 10/11/21 6283    Allergies as of 10/09/2021   (No Known Allergies)    Review of Systems  Constitutional:  Positive for malaise/fatigue and weight loss. Negative for chills and fever.  HENT:  Negative for sore throat.   Respiratory:  Positive for shortness of breath (with exertion). Negative for cough and wheezing.   Cardiovascular:  Positive for leg swelling (just pedal edema). Negative for chest pain.  Gastrointestinal:  Positive for abdominal pain, constipation, heartburn, melena, nausea and vomiting.  Negative for blood in stool and diarrhea.  Musculoskeletal:  Positive for falls (at home no LOC).  Skin:  Positive for itching (intermittent and mild AB and legs.).  Neurological:  Positive for dizziness and weakness. Negative for focal weakness and loss of consciousness.  Psychiatric/Behavioral:  Negative for memory loss.       Physical Exam:  Vital signs in last 24 hours: Temp:  [97.8 F (36.6 C)-98.4 F (36.9 C)] 98.2 F (36.8 C) (01/13 0909) Pulse Rate:  [57-68] 57 (01/13 0909) Resp:  [17-19] 19 (01/13 0909) BP: (106-123)/(79-87) 114/87 (01/13 0909) SpO2:  [84 %-100 %] 91 % (01/13 0909) Weight:  [60 kg-60.1 kg] 60.1 kg (01/13 0500) Last BM Date: 10/09/21  General:   Pleasant, well developed male in no acute distress Head:  Normocephalic and atraumatic. Eyes: sclerae anicteric,conjunctive pale  Heart:  regular rate and rhythm, no murmurs or gallops Pulm: Clear anteriorly; no wheezing Abdomen:  Soft, Flat AB, skin exam scars- well healed vertical scar, Sluggish bowel sounds. mild tenderness in the lower abdomen. Without guarding and Without rebound, without hepatomegaly. Extremities:  pedal edema. Msk:  Symmetrical without gross deformities. Peripheral pulses intact.  Neurologic:  Alert and  oriented x4;  grossly normal neurologically. No asterixis or clonus Skin:   Dry and intact without significant lesions or rashes. Psychiatric: Demonstrates good judgement and reason without abnormal affect or behaviors.  LAB RESULTS: Recent Labs    10/09/21 0151 10/10/21 0050 10/11/21 0211  WBC 5.6 8.4 10.4  HGB 11.6* 8.2* 7.9*  HCT 35.1* 24.6* 23.8*  PLT 276 148* 115*   BMET Recent Labs    10/09/21 1611 10/10/21 0050 10/11/21 0211  NA 137 140 138  K 4.0 3.7 3.8  CL 106 108 106  CO2 $Re'22 23 24  'AlJ$ GLUCOSE 172* 88 99  BUN 5* <5* 7  CREATININE 0.77 0.80 0.63  CALCIUM 7.8* 7.6* 7.4*   LFT Recent Labs    10/11/21 0211  PROT 4.1*  ALBUMIN 1.8*  AST 206*  ALT 177*   ALKPHOS 205*  BILITOT 0.5   PT/INR Recent Labs    10/09/21 0842 10/10/21 0050  LABPROT 13.3 13.9  INR 1.0 1.1    STUDIES: No results found.   Vladimir Crofts  10/11/2021, 1:14 PM

## 2021-10-11 NOTE — Progress Notes (Signed)
Subjective:  ON- None  Patient assessed at bedside this AM. Patient reports vomiting black early this morning. He had a BM and felt constipated. He does not have any other complaints or concerns at this time.  Objective:  Vital signs in last 24 hours: Vitals:   10/10/21 1759 10/10/21 2019 10/11/21 0023 10/11/21 0540  BP: 106/84 118/86 123/79 113/83  Pulse: 62 65 68 66  Resp: 18 19 17 17   Temp: 98.4 F (36.9 C) 98 F (36.7 C) 97.8 F (36.6 C) 98.2 F (36.8 C)  TempSrc: Oral  Oral   SpO2: (!) 84% 100% 99% 100%  Weight:  60 kg    Height:       Constitutional: Chronically ill and cachectic appearing middle-aged male.  No acute distress HENT: Normocephalic and atraumatic, anicteric sclera, dry mucous membranes Cardiovascular: Normal rate, regular rhythm, S1 and S2 present, no murmurs, rubs, gallops.  Respiratory: No respiratory distress, Lungs are clear to auscultation bilaterally. GI: Nondistended, non-tender, no hepatosplenomegaly, normal bowel sounds Musculoskeletal: Decreased muscle bulk, normal tone. No peripheral edema noted. Neurological: Is alert and oriented x4, no focal deficits appreciated Skin: Warm and dry. Psychiatric: Normal mood and affect. Behavior is normal.    Assessment/Plan:  Principal Problem:   Acute hepatitis  Brent Rogers is a 56 y.o. with pertinent PMH of hypertension, chronic pancreatitis on Creon status post Whipple procedure, recent admission for GI bleed, iron deficiency anemia, protein calorie malnutrition who presented with nausea and vomiting and admitted for acute hepatitis on hospital day 2.  #Acute alcoholic hepatitis  Ethanol level of 37 on admit. AST ~ 1900, ALT 522 on admit; LFTs have been downtrending since admission, now down to AST 206. ALT 177. The patient does report that he is tolerating some p.o. intake but has had some emesis recently. We have counseled the patient that his alcohol use is likely responsible for his  presentation here. Otherwise, we will ensure that patient is able to tolerate p.o. intake prior to discharge.  -Regular diet, continue to monitor his PO intake -Trend LFTs    #Protein calorie malnutrition #Chronic pancreatic insufficiency #Physical deconditioning  Patient with history of chronic pancreatic insufficiency for which he is on Creon supplementation.  He has had significant weight loss of up to 30 pounds in past 6 months.  BMI 15 with albumin of 1.8. Given significant malnutrition and downtrending albumin levels, do not feel it is safe to discharge the patient without further input from our GI colleagues. - GI is on board, appreciate recommendations  - Appreciate RD recommendations - IV maintenance fluids - Creon supplementation - PT/OT eval    #Iron deficiency anemia #Recent GI bleed Patient with history of iron deficiency anemia for which she is on iron supplementation. He did have a recent admission for GI bleed and noted to have esophagitis and nonbleeding anastomic ulcer with gastritis. Patient denies any further melena at this time. Hemoglobin has decreased to 7.9 from 8.2 yesterday. Iron 60, ferritin 344 (could be falsely normal with ferritin being an acute phase reactant). Will continue to monitor closely. - Continue home protonix 40mg  bid - Trend CBC   #Hypertension  Patient is on amlodipine at home for hypertension. BP's continue to be soft in the setting of dehydration. - Holding home amlodipine    Prior to Admission Living Arrangement: Home Anticipated Discharge Location: Home Barriers to Discharge: Medical stability, GI evaluation Dispo: Anticipated discharge in approximately 1-2 day(s).   Orvis Brill, MD 10/11/2021, 6:34  AM Pager: [phone]  After 5pm on weekdays and 1pm on weekends: On Call pager [phone]

## 2021-10-12 DIAGNOSIS — B179 Acute viral hepatitis, unspecified: Secondary | ICD-10-CM | POA: Diagnosis not present

## 2021-10-12 DIAGNOSIS — K259 Gastric ulcer, unspecified as acute or chronic, without hemorrhage or perforation: Secondary | ICD-10-CM | POA: Diagnosis not present

## 2021-10-12 LAB — CBC
HCT: 22 % — ABNORMAL LOW (ref 39.0–52.0)
Hemoglobin: 7.5 g/dL — ABNORMAL LOW (ref 13.0–17.0)
MCH: 31 pg (ref 26.0–34.0)
MCHC: 34.1 g/dL (ref 30.0–36.0)
MCV: 90.9 fL (ref 80.0–100.0)
Platelets: 106 10*3/uL — ABNORMAL LOW (ref 150–400)
RBC: 2.42 MIL/uL — ABNORMAL LOW (ref 4.22–5.81)
RDW: 15 % (ref 11.5–15.5)
WBC: 10.5 10*3/uL (ref 4.0–10.5)
nRBC: 0 % (ref 0.0–0.2)

## 2021-10-12 LAB — COMPREHENSIVE METABOLIC PANEL
ALT: 116 U/L — ABNORMAL HIGH (ref 0–44)
AST: 80 U/L — ABNORMAL HIGH (ref 15–41)
Albumin: 1.7 g/dL — ABNORMAL LOW (ref 3.5–5.0)
Alkaline Phosphatase: 184 U/L — ABNORMAL HIGH (ref 38–126)
Anion gap: 7 (ref 5–15)
BUN: 6 mg/dL (ref 6–20)
CO2: 22 mmol/L (ref 22–32)
Calcium: 7.3 mg/dL — ABNORMAL LOW (ref 8.9–10.3)
Chloride: 106 mmol/L (ref 98–111)
Creatinine, Ser: 0.6 mg/dL — ABNORMAL LOW (ref 0.61–1.24)
GFR, Estimated: >60 mL/min (ref >60)
Glucose, Bld: 134 mg/dL — ABNORMAL HIGH (ref 70–99)
Potassium: 3.2 mmol/L — ABNORMAL LOW (ref 3.5–5.1)
Sodium: 135 mmol/L (ref 135–145)
Total Bilirubin: 0.4 mg/dL (ref 0.3–1.2)
Total Protein: 3.8 g/dL — ABNORMAL LOW (ref 6.5–8.1)

## 2021-10-12 LAB — ANA W/REFLEX IF POSITIVE: Anti Nuclear Antibody (ANA): NEGATIVE

## 2021-10-12 LAB — IGG: IgG (Immunoglobin G), Serum: 1279 mg/dL (ref 603–1613)

## 2021-10-12 LAB — CERULOPLASMIN: Ceruloplasmin: 14.4 mg/dL — ABNORMAL LOW (ref 16.0–31.0)

## 2021-10-12 LAB — ALPHA-1-ANTITRYPSIN: A-1 Antitrypsin, Ser: 155 mg/dL (ref 101–187)

## 2021-10-12 MED ORDER — POLYETHYLENE GLYCOL 3350 17 G PO PACK
17.0000 g | PACK | Freq: Two times a day (BID) | ORAL | Status: DC
  Administered 2021-10-12 – 2021-10-14 (×4): 17 g via ORAL
  Filled 2021-10-12 (×4): qty 1

## 2021-10-12 MED ORDER — DOCUSATE SODIUM 100 MG PO CAPS
200.0000 mg | ORAL_CAPSULE | Freq: Every day | ORAL | Status: DC
  Administered 2021-10-12 – 2021-10-14 (×3): 200 mg via ORAL
  Filled 2021-10-12 (×3): qty 2

## 2021-10-12 MED ORDER — HYDROMORPHONE HCL 1 MG/ML IJ SOLN
0.5000 mg | Freq: Once | INTRAMUSCULAR | Status: AC | PRN
  Administered 2021-10-12: 0.5 mg via INTRAVENOUS
  Filled 2021-10-12: qty 0.5

## 2021-10-12 NOTE — Progress Notes (Signed)
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 N/V possibly releated to gastric ulcer seen on recent EGD.  --Continue zofran as needed --Continue BID PPI for 12 weeks then once daily --Continue Creon  # Acute on chronic anemia.  --Hgb stable overnight at 7.5 ( without transfusion),  --He apparently had not started  PPI following EGD on 12/29. Additionally he had been getting Motrin. Now getting BID PPI and motrin stopped  # Abnormal liver chemistries felt to be secondary to shock liver.  --Liver chemistries continue to improved. AST 862 >> 512 >> 206 >> 80. ALT 522>> 274>> 177 >> 116. Alk phos 205 >> 184.   # Constipation --Had a hard BM today. Brown color but was black last night.  --Continue BID Miralax --Will add stool softener.  SUBJECTIVE   Some nausea, vomited " a little" earlier today. He had a black stool last night but BM this am was brown but hurt coming out. Stool is hard.        OBJECTIVE      Scheduled inpatient medications:   feeding supplement  1 Container Oral TID BM   lipase/protease/amylase  36,000 Units Oral TID with meals   metoCLOPramide (REGLAN) injection  5 mg Intravenous TID WC   multivitamin with minerals  1 tablet Oral Daily   pantoprazole (PROTONIX) IV  40 mg Intravenous Q12H   polyethylene glycol  17 g Oral BID   ramelteon  8 mg Oral QHS   sodium chloride flush  3 mL Intravenous Q12H   thiamine  100 mg Oral Daily   Continuous inpatient infusions:   promethazine (PHENERGAN) injection (IM or IVPB) 12.5 mg (10/11/21 0350)   PRN inpatient medications: alum & mag hydroxide-simeth, lipase/protease/amylase, promethazine (PHENERGAN) injection (IM or IVPB)  Vital signs in last 24 hours: Temp:  [98.2 F (36.8 C)-99 F (37.2 C)] 98.2 F (36.8 C) (01/14 0821) Pulse Rate:   [60-79] 60 (01/14 0821) Resp:  [17-19] 18 (01/14 0821) BP: (103-124)/(77-90) 124/90 (01/14 0821) SpO2:  [97 %-100 %] 100 % (01/14 0821) Last BM Date: 10/12/21  Intake/Output Summary (Last 24 hours) at 10/12/2021 1507 Last data filed at 10/12/2021 0900 Gross per 24 hour  Intake 940 ml  Output 801 ml  Net 139 ml     Physical Exam:  General: Alert male in NAD Heart:  Regular rate and rhythm. No lower extremity edema Pulmonary: Normal respiratory effort Abdomen: Soft, nondistended, nontender. Normal bowel sounds.  Neurologic: Alert and oriented Psych: Pleasant. Cooperative.   Filed Weights   10/10/21 0430 10/10/21 2019 10/11/21 0500  Weight: 58.2 kg 60 kg 60.1 kg    Intake/Output from previous day: 01/13 0701 - 01/14 0700 In: 1220 [P.O.:1220] Out: 801 [Urine:800; Stool:1] Intake/Output this shift: Total I/O In: 400 [P.O.:400] Out: -     Lab Results: Recent Labs    10/10/21 0050 10/11/21 0211 10/12/21 0316  WBC 8.4 10.4 10.5  HGB 8.2* 7.9* 7.5*  HCT 24.6* 23.8* 22.0*  PLT 148* 115* 106*   BMET Recent Labs    10/10/21 0050 10/11/21 0211 10/12/21 0316  NA 140 138 135  K 3.7 3.8 3.2*  CL 108 106 106  CO2 $Re'23 24 22  'uTv$ GLUCOSE 88 99 134*  BUN <5* 7 6  CREATININE 0.80 0.63 0.60*  CALCIUM 7.6* 7.4*  7.3*   LFT Recent Labs    10/12/21 0316  PROT 3.8*  ALBUMIN 1.7*  AST 80*  ALT 116*  ALKPHOS 184*  BILITOT 0.4   PT/INR Recent Labs    10/10/21 0050  LABPROT 13.9  INR 1.1   Hepatitis Panel No results for input(s): HEPBSAG, HCVAB, HEPAIGM, HEPBIGM in the last 72 hours.  No results found.      Principal Problem:   Acute hepatitis Active Problems:   Elevated LFTs   Gastric ulcer without hemorrhage or perforation     LOS: 3 days   Tye Savoy ,NP 10/12/2021, 3:07 PM

## 2021-10-12 NOTE — Progress Notes (Signed)
° °Subjective:  °ON- None ° °Patient assessed at bedside this AM. Patient states that he he ate egg and toast for breakfast and a turkey sandwich for lunch.  Reports 1 emesis after dinner last night.  He has been eating more than at home.  He continues to have a lot of abdominal pain after bowel movements. He does not have any other complaints or concerns at this time. ° °Objective: ° °Vital signs in last 24 hours: °Vitals:  ° 10/11/21 1619 10/11/21 2022 10/12/21 0436 10/12/21 0821  °BP: 118/80 103/81 123/77 124/90  °Pulse: 79 78 70 60  °Resp: 19 17 18 18  °Temp: 98.9 °F (37.2 °C) 99 °F (37.2 °C) 98.2 °F (36.8 °C) 98.2 °F (36.8 °C)  °TempSrc:   Oral Oral  °SpO2: 97% 100% 100% 100%  °Weight:      °Height:      ° °Constitutional: Chronically ill and cachectic appearing middle-aged male.  No acute distress °HENT: Normocephalic and atraumatic, anicteric sclera.  Facial edema noted °Cardiovascular: Normal rate, regular rhythm, S1 and S2 present, no murmurs, rubs, gallops.  °Respiratory: No respiratory distress, normal work of breathing °GI: Flat, nondistended, non-tender, no hepatosplenomegaly, normal bowel sounds °Musculoskeletal: Decreased muscle bulk, normal tone. No peripheral edema noted. °Neurological: Is alert and oriented x4, no focal deficits appreciated °Skin: Warm and dry. °Psychiatric: Normal mood and affect. Behavior is normal.  ° ° °Assessment/Plan: ° °Principal Problem: °  Acute hepatitis °Active Problems: °  Elevated LFTs °  Gastric ulcer without hemorrhage or perforation ° °Brent Rogers is a 55 y.o. with pertinent PMH of hypertension, chronic pancreatitis on Creon status post Whipple procedure, recent admission for GI bleed, iron deficiency anemia, protein calorie malnutrition who presented with nausea and vomiting and admitted for acute hepatitis. ° °#Acute alcoholic hepatitis  °Secondary to alcoholic hepatitis vs viral infection in combination with shock liver.  AST, ALT and alk phos continue to  trend down nicely.  Total bilirubin normal.  His p.o. intake is improving with less emesis episodes. °-Appreciate GI recommendations °-Pending autoimmune hepatitis labs °  °#Protein calorie malnutrition °#Chronic pancreatic insufficiency °#Physical deconditioning  °Patient with history of chronic pancreatic insufficiency for which he is on Creon supplementation.  He has had significant weight loss of up to 30 pounds in past 6 months.  BMI 15 with albumin of 1.8. °RD was consulted and recommended core track placement.  I spoke to Kathleen, RD about his intake yesterday and she felt more comfortable with his improving p.o. intake.  We will reassess his intake today, if continues to improve, will not need feeding tube °- Appreciate RD recommendations °- Creon supplementation °- PT/OT eval  °- Bowel regimen for constipation °  °#Normocytic anemia °#Recent GI bleed °Patient with history of iron deficiency anemia on home ferrous sulfate.  Hemoglobin continues to trend down to 7.5 today.  B12 and folate were within normal limits.  Ferritin was 344 with iron sat of 50.  Suspect IDA from esophagitis/gastritis in combination with anemia of chronic disease. °- Continue home protonix 40mg bid °- Trend CBC °- If continues to trend down, may need blood transfusion °  °#Hypertension  °Patient is on amlodipine at home for hypertension. BP's continue to be soft in the setting of dehydration. °- Holding home amlodipine  ° °Diet: reg °Full code °IVF: N/A °DVT: SCDs °  °Prior to Admission Living Arrangement: Home °Anticipated Discharge Location: Home °Barriers to Discharge: Medical stability, GI evaluation °Dispo: Anticipated discharge in approximately 1-2   day(s).  ° °Brent Rogers, Katie, DO °10/12/2021, 9:08 AM °Pager: [phone]  °After 5pm on weekdays and 1pm on weekends: On Call pager [phone] ° °

## 2021-10-12 NOTE — Evaluation (Signed)
Physical Therapy Evaluation Patient Details Name: Brent Rogers MRN: [id number] DOB: 09-11-1966 Today's Date: 10/12/2021  History of Present Illness  56 y/o male presented to ED on 10/09/21 for nause/vomiting x 2 days. Recently admitted 12/28-12/31/22 for upper GI bleed related to esophagitis. Now admited for acute hepatitis. PMHx significant for chronic pancreatitis, Whipple's procedure, HTN, migraines and scoliosis.  Clinical Impression  Patient admitted with above diagnosis. Patient presents with generalized weakness, impaired balance, and decreased activity tolerance. Patient at supervision level for mobility with use of IV pole. Patient would benefit from use of cane for mobility for support. Patient with complaints of SOB at end of session but states this is typical for him after ambulation. Patient will benefit from skilled PT services during acute stay to address listed deficits. Recommend OPPT at discharge to address balance, strength, and endurance deficits.        Recommendations for follow up therapy are one component of a multi-disciplinary discharge planning process, led by the attending physician.  Recommendations may be updated based on patient status, additional functional criteria and insurance authorization.  Follow Up Recommendations Outpatient PT    Assistance Recommended at Discharge PRN  Patient can return home with the following  A little help with walking and/or transfers    Equipment Recommendations Cane  Recommendations for Other Services       Functional Status Assessment Patient has had a recent decline in their functional status and demonstrates the ability to make significant improvements in function in a reasonable and predictable amount of time.     Precautions / Restrictions Precautions Precautions: Fall Restrictions Weight Bearing Restrictions: No      Mobility  Bed Mobility Overal bed mobility: Modified Independent                   Transfers Overall transfer level: Needs assistance Equipment used: None Transfers: Sit to/from Stand Sit to Stand: Supervision                Ambulation/Gait Ambulation/Gait assistance: Supervision Gait Distance (Feet): 250 Feet Assistive device: IV Pole Gait Pattern/deviations: Step-through pattern;Decreased stride length;Trunk flexed Gait velocity: decreased     General Gait Details: supervision for safety. Reliant on use of IV pole for support during mobility. No overt LOB noted  Stairs            Wheelchair Mobility    Modified Rankin (Stroke Patients Only)       Balance Overall balance assessment: Mild deficits observed, not formally tested                                           Pertinent Vitals/Pain Pain Assessment: No/denies pain    Home Living Family/patient expects to be discharged to:: Private residence Living Arrangements: Alone Available Help at Discharge: Family;Available PRN/intermittently (Aunt provides transportation occasionally) Type of Home: Apartment (1st floor) Home Access: Stairs to enter Entrance Stairs-Rails: Right;Left;Can reach both Entrance Stairs-Number of Steps: 9 (3 in back)   Home Layout: One level Home Equipment: Grab bars - tub/shower      Prior Function Prior Level of Function : Independent/Modified Independent;History of Falls (last six months)             Mobility Comments: reports 15 falls in past 6 months       Hand Dominance        Extremity/Trunk Assessment   Upper  Extremity Assessment Upper Extremity Assessment: Generalized weakness    Lower Extremity Assessment Lower Extremity Assessment: Generalized weakness    Cervical / Trunk Assessment Cervical / Trunk Assessment: Kyphotic  Communication   Communication: No difficulties  Cognition Arousal/Alertness: Awake/alert Behavior During Therapy: WFL for tasks assessed/performed Overall Cognitive Status: Within  Functional Limits for tasks assessed                                          General Comments      Exercises     Assessment/Plan    PT Assessment Patient needs continued PT services  PT Problem List Decreased strength;Decreased activity tolerance;Decreased balance;Decreased mobility       PT Treatment Interventions DME instruction;Gait training;Stair training;Functional mobility training;Therapeutic activities;Therapeutic exercise;Balance training;Patient/family education    PT Goals (Current goals can be found in the Care Plan section)  Acute Rehab PT Goals Patient Stated Goal: to have a cane and go home PT Goal Formulation: With patient Time For Goal Achievement: 10/26/21 Potential to Achieve Goals: Good    Frequency Min 3X/week     Co-evaluation               AM-PAC PT "6 Clicks" Mobility  Outcome Measure Help needed turning from your back to your side while in a flat bed without using bedrails?: None Help needed moving from lying on your back to sitting on the side of a flat bed without using bedrails?: None Help needed moving to and from a bed to a chair (including a wheelchair)?: A Little Help needed standing up from a chair using your arms (e.g., wheelchair or bedside chair)?: A Little Help needed to walk in hospital room?: A Little Help needed climbing 3-5 steps with a railing? : A Little 6 Click Score: 20    End of Session Equipment Utilized During Treatment: Gait belt Activity Tolerance: Patient tolerated treatment well Patient left: in bed;with call bell/phone within reach;with SCD's reapplied Nurse Communication: Mobility status PT Visit Diagnosis: Unsteadiness on feet (R26.81);Muscle weakness (generalized) (M62.81);History of falling (Z91.81)    Time: 4098-1191 PT Time Calculation (min) (ACUTE ONLY): 15 min   Charges:   PT Evaluation $PT Eval Low Complexity: 1 Low          [name] A. Dan Humphreys PT, DPT Acute Rehabilitation  Services Pager [phone] Office [phone]   Viviann Spare 10/12/2021, 5:12 PM

## 2021-10-12 NOTE — Progress Notes (Signed)
Nutrition Follow-up  DOCUMENTATION CODES:   Underweight, Severe malnutrition in context of chronic illness  INTERVENTION:   Given severity of malnutrition, ongoing N/V, and poor PO intake, recommend post-pyloric Cortrak placement on next Cortrak team date of Monday 10/14/21 and initiation of tube feeds. Recommend: - Start Osmolite 1.5 @ 20 ml/hr and advance by 10 ml q 8 hours to goal rate of 55 ml/hr (1320 ml/day) - ProSource TF 45 ml daily  Recommended tube feeding regimen at goal rate would provide 2020 kcal, 94 grams of protein, and 1006 ml of H2O.  If tube feeds are initiated, monitor magnesium, potassium, and phosphorus BID for at least 3 days, MD to replete as needed, as pt is at risk for refeeding syndrome given severe malnutrition.  - Continue Boost Breeze po TID, each supplement provides 250 kcal and 9 grams of protein  - Continue MVI with minerals daily  - Encourage PO intake as able  NUTRITION DIAGNOSIS:   Severe Malnutrition related to chronic illness as evidenced by severe muscle depletion, severe fat depletion.  Ongoing, being addressed via oral nutrition supplements and RD recommendations above  GOAL:   Patient will meet greater than or equal to 90% of their needs  Unmet, pt with ongoing poor PO intake and N/V  MONITOR:   PO intake, Supplement acceptance, Labs, Weight trends, I & O's  REASON FOR ASSESSMENT:   Consult Assessment of nutrition requirement/status, Poor PO  ASSESSMENT:   56 y.o. male presented to the ED with ongoing nausea and vomiting. Pt recently admitted at the end of December for dark stools and was found to be anemic. PMH includes chronic pancreatitis s/p Whipple procedure, HTN, and GERD. Pt admitted with acute hepatitis.  Per GI notes, N/V likely secondary to gastric ulcer without H. pylori. Pt on 24 hours of IV reglan with IV zofran ordered PRN as well as IV protonix.  RD working remotely. Unable to reach pt via phone call to room  despite several attempts. Discussed pt with RN via secure chat. Meal completion of 100% documented for breakfast this morning. Suspect that this documentation is inaccurate as RN reports pt only ate a little bit of breakfast this morning. RN also states pt reported emesis to NT but declined anti-emetics.  Discussed pt with MD via phone call. Given severity of malnutrition, ongoing N/V, and poor PO intake, recommend post-pyloric Cortrak placement on next Cortrak team date of Monday 10/14/21 and initiation of tube feeds. MD to discuss with team. Will leave tube feeding recommendations above.  Admit weight: 56 kg Current weight: 60.1 kg  Pt with mild pitting edema to BLE. Suspect weight gain related to positive fluid balance as pt is +6.0 L since admit.  Meal Completion: 50-100% x last 6 documented meals  Medications reviewed and include: Boost Breeze TID (pt refusing), Creon 36,000 units TID with meals, IV reglan 5 mg TID with meals, MVI with minerals daily, IV protonix, miralax, thiamine, IV phenergan PRN  Labs reviewed: potassium 3.2, elevated LFTs, hemoglobin 7.5, platelets 106  UOP: 800 ml x 24 hours I/O's: +6.0 L since admit  Diet Order:   Diet Order             Diet regular Room service appropriate? Yes; Fluid consistency: Thin  Diet effective now                   EDUCATION NEEDS:   No education needs have been identified at this time  Skin:  Skin Assessment: Skin  Integrity Issues: Stage I: coccyx  Last BM:  10/12/21  Height:   Ht Readings from Last 1 Encounters:  10/09/21 6\' 4"  (1.93 m)    Weight:   Wt Readings from Last 1 Encounters:  10/11/21 60.1 kg    Ideal Body Weight:  91.8 kg  BMI:  Body mass index is 16.13 kg/m.  Estimated Nutritional Needs:   Kcal:  1800-2000  Protein:  85-100 grams  Fluid:  >/= 1.8 L    Gustavus Bryant, MS, RD, LDN Inpatient Clinical Dietitian Please see [facility] for contact information.

## 2021-10-13 DIAGNOSIS — B179 Acute viral hepatitis, unspecified: Secondary | ICD-10-CM | POA: Diagnosis not present

## 2021-10-13 LAB — CBC
HCT: 23.9 % — ABNORMAL LOW (ref 39.0–52.0)
Hemoglobin: 7.7 g/dL — ABNORMAL LOW (ref 13.0–17.0)
MCH: 29.6 pg (ref 26.0–34.0)
MCHC: 32.2 g/dL (ref 30.0–36.0)
MCV: 91.9 fL (ref 80.0–100.0)
Platelets: 105 10*3/uL — ABNORMAL LOW (ref 150–400)
RBC: 2.6 MIL/uL — ABNORMAL LOW (ref 4.22–5.81)
RDW: 15.1 % (ref 11.5–15.5)
WBC: 12.3 10*3/uL — ABNORMAL HIGH (ref 4.0–10.5)
nRBC: 0.2 % (ref 0.0–0.2)

## 2021-10-13 LAB — COMPREHENSIVE METABOLIC PANEL
ALT: 93 U/L — ABNORMAL HIGH (ref 0–44)
AST: 47 U/L — ABNORMAL HIGH (ref 15–41)
Albumin: 1.7 g/dL — ABNORMAL LOW (ref 3.5–5.0)
Alkaline Phosphatase: 174 U/L — ABNORMAL HIGH (ref 38–126)
Anion gap: 6 (ref 5–15)
BUN: 7 mg/dL (ref 6–20)
CO2: 25 mmol/L (ref 22–32)
Calcium: 7.7 mg/dL — ABNORMAL LOW (ref 8.9–10.3)
Chloride: 105 mmol/L (ref 98–111)
Creatinine, Ser: 0.76 mg/dL (ref 0.61–1.24)
GFR, Estimated: >60 mL/min (ref >60)
Glucose, Bld: 81 mg/dL (ref 70–99)
Potassium: 3.9 mmol/L (ref 3.5–5.1)
Sodium: 136 mmol/L (ref 135–145)
Total Bilirubin: 0.5 mg/dL (ref 0.3–1.2)
Total Protein: 4.2 g/dL — ABNORMAL LOW (ref 6.5–8.1)

## 2021-10-13 LAB — MAGNESIUM: Magnesium: 1.3 mg/dL — ABNORMAL LOW (ref 1.7–2.4)

## 2021-10-13 MED ORDER — MAGNESIUM SULFATE 2 GM/50ML IV SOLN
2.0000 g | Freq: Once | INTRAVENOUS | Status: AC
  Administered 2021-10-13: 2 g via INTRAVENOUS
  Filled 2021-10-13: qty 50

## 2021-10-13 NOTE — Evaluation (Signed)
Occupational Therapy Evaluation Patient Details Name: Brent Rogers MRN: [id number] DOB: 21-Jul-1966 Today's Date: 10/13/2021   History of Present Illness 56 y/o male presented to ED on 10/09/21 for nause/vomiting x 2 days. Recently admitted 12/28-12/31/22 for upper GI bleed related to esophagitis. Now admited for acute hepatitis. PMHx significant for chronic pancreatitis, Whipple's procedure, HTN, migraines and scoliosis.   Clinical Impression   Pt reports independence at baseline with ADLs and functional mobility, reports multiple falls over the past 6 months. Pt currently set up- supervision for ADLs, mod I for bed mobility, and supervision for transfers. Pt reporting mild dizziness and LLE > RLE weakness during session, however able to walk hallway distance without AD or LOB. Pt presenting with impairments listed below, will continue to follow acutely. Recommend d/c home.      Recommendations for follow up therapy are one component of a multi-disciplinary discharge planning process, led by the attending physician.  Recommendations may be updated based on patient status, additional functional criteria and insurance authorization.   Follow Up Recommendations  No OT follow up    Assistance Recommended at Discharge PRN  Patient can return home with the following Assistance with cooking/housework;Help with stairs or ramp for entrance    Functional Status Assessment  Patient has had a recent decline in their functional status and demonstrates the ability to make significant improvements in function in a reasonable and predictable amount of time.  Equipment Recommendations  None recommended by OT    Recommendations for Other Services PT consult     Precautions / Restrictions Precautions Precautions: Fall Restrictions Weight Bearing Restrictions: No      Mobility Bed Mobility Overal bed mobility: Modified Independent                  Transfers Overall transfer level:  Needs assistance Equipment used: None Transfers: Sit to/from Stand Sit to Stand: Supervision                  Balance Overall balance assessment: Mild deficits observed, not formally tested                                         ADL either performed or assessed with clinical judgement   ADL Overall ADL's : Needs assistance/impaired Eating/Feeding: Independent;Sitting   Grooming: Independent;Sitting   Upper Body Bathing: Set up;Sitting   Lower Body Bathing: Sitting/lateral leans;Min guard   Upper Body Dressing : Independent;Sitting   Lower Body Dressing: Independent;Bed level Lower Body Dressing Details (indicate cue type and reason): dons socks long sitting in bed Toilet Transfer: Min guard;Ambulation;Regular Glass blower/designer Details (indicate cue type and reason): simulated in room Toileting- Clothing Manipulation and Hygiene: Supervision/safety;Sit to/from stand Toileting - Clothing Manipulation Details (indicate cue type and reason): able to manage clothing prior to sitting     Functional mobility during ADLs: Min guard       Vision   Vision Assessment?: No apparent visual deficits     Perception     Praxis      Pertinent Vitals/Pain Pain Assessment: No/denies pain     Hand Dominance     Extremity/Trunk Assessment Upper Extremity Assessment Upper Extremity Assessment: Generalized weakness   Lower Extremity Assessment Lower Extremity Assessment: Defer to PT evaluation   Cervical / Trunk Assessment Cervical / Trunk Assessment: Normal   Communication Communication Communication: No difficulties   Cognition Arousal/Alertness: Awake/alert  Behavior During Therapy: WFL for tasks assessed/performed Overall Cognitive Status: Within Functional Limits for tasks assessed                                       General Comments  pt reporting weakness LLE > RLE,    Exercises     Shoulder Instructions       Home Living Family/patient expects to be discharged to:: Private residence Living Arrangements: Alone Available Help at Discharge: Family;Available PRN/intermittently Type of Home: Apartment Home Access: Stairs to enter Entrance Stairs-Number of Steps: 9 front, 5 back door Entrance Stairs-Rails: Right;Left;Can reach both Home Layout: One level     Bathroom Shower/Tub: Teacher, early years/pre: Standard     Home Equipment: Grab bars - tub/shower          Prior Functioning/Environment Prior Level of Function : Independent/Modified Independent;History of Falls (last six months)             Mobility Comments: reports 15 falls in past 6 months          OT Problem List: Decreased strength;Decreased range of motion;Decreased activity tolerance;Impaired balance (sitting and/or standing);Decreased safety awareness;Decreased knowledge of use of DME or AE      OT Treatment/Interventions: Self-care/ADL training;Therapeutic exercise;DME and/or AE instruction;Energy conservation;Balance training;Patient/family education;Therapeutic activities    OT Goals(Current goals can be found in the care plan section) Acute Rehab OT Goals Patient Stated Goal: to go home OT Goal Formulation: With patient Time For Goal Achievement: 10/27/21 Potential to Achieve Goals: Good ADL Goals Pt Will Perform Lower Body Dressing: Independently;sit to/from stand Pt Will Transfer to Toilet: Independently;ambulating;regular height toilet Pt Will Perform Tub/Shower Transfer: with supervision;ambulating;Tub transfer  OT Frequency: Min 2X/week    Co-evaluation              AM-PAC OT "6 Clicks" Daily Activity     Outcome Measure Help from another person eating meals?: None Help from another person taking care of personal grooming?: None Help from another person toileting, which includes using toliet, bedpan, or urinal?: A Little Help from another person bathing (including washing,  rinsing, drying)?: A Little Help from another person to put on and taking off regular upper body clothing?: None Help from another person to put on and taking off regular lower body clothing?: A Little 6 Click Score: 21   End of Session Equipment Utilized During Treatment: Gait belt Nurse Communication: Mobility status  Activity Tolerance: Patient tolerated treatment well Patient left: in bed;with call bell/phone within reach;Other (comment) (MD in room)  OT Visit Diagnosis: Unsteadiness on feet (R26.81);Other abnormalities of gait and mobility (R26.89);Repeated falls (R29.6);Muscle weakness (generalized) (M62.81);History of falling (Z91.81)                Time: 1300-1316 OT Time Calculation (min): 16 min Charges:  OT General Charges $OT Visit: 1 Visit OT Evaluation $OT Eval Low Complexity: 1 Low  Lynnda Child, OTD, OTR/L Acute Rehab [phone] - [place] 10/13/2021, 2:03 PM

## 2021-10-13 NOTE — Plan of Care (Signed)
Pt ambulating with PT in the hallway.

## 2021-10-13 NOTE — Progress Notes (Signed)
Subjective:  ON- None  Patient assessed at bedside this AM. Patient states that he has been eating well in the hospital.  Had 1 episode of emesis overnight.  Otherwise, he does not have any other complaints or concerns at this time.  Objective:  Vital signs in last 24 hours: Vitals:   10/12/21 1723 10/12/21 1943 10/13/21 0458 10/13/21 0500  BP: (!) 115/92 121/89 115/90 115/90  Pulse: 72 73 70 72  Resp:  17    Temp: 98.4 F (36.9 C) 99 F (37.2 C)  98.1 F (36.7 C)  TempSrc: Oral Oral  Oral  SpO2: 100% 100% 100% 100%  Weight:      Height:       Constitutional: Chronically ill and cachectic appearing middle-aged male in NAD HENT: Normocephalic and atraumatic, anicteric sclera.  Cardiovascular: Normal rate, regular rhythm, S1 and S2 present, no murmurs, rubs, gallops.  Respiratory: No respiratory distress, normal work of breathing GI: Flat, nondistended, non-tender, no hepatosplenomegaly, normal bowel sounds Musculoskeletal: Decreased muscle bulk, normal tone. No peripheral edema noted. Neurological: Is alert and oriented x4, no focal deficits appreciated Skin: Warm and dry. Psychiatric: Normal mood and affect. Behavior is normal.   Assessment/Plan:  Principal Problem:   Acute hepatitis Active Problems:   Elevated LFTs   Gastric ulcer without hemorrhage or perforation  Brent Rogers is a 56 y.o. with pertinent PMH of hypertension, chronic pancreatitis on Creon status post Whipple procedure, recent admission for GI bleed, iron deficiency anemia, protein calorie malnutrition who presented with nausea and vomiting and admitted for acute hepatitis.  We will plan for discharge tomorrow.  #Acute alcoholic hepatitis  Secondary to alcoholic hepatitis in combination with shock liver. AST, ALT and alk phos continue to trend down nicely. Total bilirubin normal.  His p.o. intake is improving with less emesis episodes.  Plan for discharge tomorrow. -Appreciate GI  recommendations -Pending autoimmune hepatitis labs   #Protein calorie malnutrition #Chronic pancreatic insufficiency #Physical deconditioning  Patient with history of chronic pancreatic insufficiency for which he is on Creon supplementation.  He has had significant weight loss of up to 30 pounds in past 6 months.  BMI 15 with albumin of 1.8. RD was consulted and recommended core track placement. Spoke to [place], [place] about his intake and she felt more comfortable with his improving p.o. intake.  He also continues to gain weight appropriately in the hospital.  Therefore, no indication to place feeding tube at this time.  We will continue to assess his p.o. intake prior to discharge. - Appreciate RD recommendations - Creon supplementation - PT/OT eval  - Bowel regimen for constipation   #Normocytic anemia #Recent GI bleed Patient with history of iron deficiency anemia on home ferrous sulfate.  Hemoglobin is low but has been stable the past few days, at 7.7 today. B12 and folate were within normal limits.  Ferritin was 344 with iron sat of 50.  Suspect IDA from esophagitis/gastritis in combination with anemia of chronic disease. - Continue home protonix $RemoveBefor'40mg'[id number]$  bid - Trend CBC - If continues to trend down, may need blood transfusion   #Hypertension  Patient is on amlodipine at home for hypertension. BP's are low to normal. - Holding home amlodipine in the setting of low normal blood pressures  Diet: reg Full code IVF: N/A DVT: SCDs   Prior to Admission Living Arrangement: Home Anticipated Discharge Location: Home Dispo: Anticipated discharge in approximately 0-1 days  Orvis Brill, MD 10/13/2021, 7:27 AM Pager: [phone]  After  5pm on weekdays and 1pm on weekends: On Call pager [phone]

## 2021-10-13 NOTE — Discharge Summary (Addendum)
Name: Brent Rogers MRN: [id number] DOB: Nov 09, 1965 56 y.o. PCP: Brent Pier, MD  Date of Admission: 10/09/2021  1:31 AM Date of Discharge: 10/14/2021 Attending Physician: Axel Filler, *  Discharge Diagnosis: 1.  Acute alcoholic hepatitis versus shock liver 2.  Acute on chronic iron deficiency anemia 3.  Protein calorie malnutrition, chronic pancreatic insufficiency, physical deconditioning 4.  Hypertension  Discharge Medications: Allergies as of 10/14/2021   No Known Allergies      Medication List     STOP taking these medications    amLODipine 5 MG tablet Commonly known as: NORVASC   famotidine 20 MG tablet Commonly known as: PEPCID   FeroSul 325 (65 FE) MG tablet Generic drug: ferrous sulfate   mirtazapine 15 MG tablet Commonly known as: REMERON   multivitamin with minerals Tabs tablet   ondansetron 8 MG disintegrating tablet Commonly known as: ZOFRAN-ODT   sucralfate 1 GM/10ML suspension Commonly known as: Carafate   venlafaxine XR 75 MG 24 hr capsule Commonly known as: EFFEXOR-XR       TAKE these medications    Creon 36000 UNITS Cpep capsule Generic drug: lipase/protease/amylase Take 1 capsule (36,000 Units total) by mouth with breakfast, with lunch, and with evening meal.   lipase/protease/amylase 12000-38000 units Cpep capsule Commonly known as: CREON Take 1 capsule (12,000 Units total) by mouth 2 (two) times daily as needed (with snacks).   pantoprazole 40 MG tablet Commonly known as: PROTONIX Take 1 tablet (40 mg total) by mouth 2 (two) times daily.   promethazine 25 MG tablet Commonly known as: PHENERGAN Take 1 tablet (25 mg total) by mouth every 6 (six) hours as needed for nausea or vomiting.   ramelteon 8 MG tablet Commonly known as: ROZEREM Take 1 tablet (8 mg total) by mouth at bedtime.        Disposition and follow-up:   Mr.Brent Rogers was discharged from [facility] in Stable  condition.  At the hospital follow up visit please address:  Severe malnutrition: Patient was discharged on Phenergan, lower dose of Remeron 8 mg daily, and Creon 12,000 to take with snacks.  Patient otherwise states that he has enough Creon at home to eat with meals.  Recent GI bleed/IDA: Discharged on Protonix regimen.  2.  Labs / imaging needed at time of follow-up: CBC, CMP  3.  Pending labs/ test needing follow-up: Anti-smooth muscle antibody, mitochondrial antibodies  Follow-up Appointments:   Hospital Course by problem list:  #Acute alcoholic hepatitis  Patient presented here with nausea/vomiting and work-up demonstrating significant elevations in AST, ALT, and alkaline phosphatase. AST 1904, ALT 522, Alk phos 286 on admit. Total bilirubin and PT/INR wnl.  Ethanol levels were noted to be 37. Given h/o alcohol use addressed during his prior admission for GI bleed, h/o malnutrition, current pattern of liver enzymes, AST>ALT by ratio of 2:1, elevated GGT, and his detectable alcohol levels on admission, most likely alcoholic hepatitis.  There is also likely a component of ischemic hepatitis in the setting of hypotension from dehydration.  Autoimmune hepatitis less likely given liver enzyme patterns and given that the patient has improved since admission, however obtained autoimmune labs during this hospitalization which were unremarkable with the exception of some pending labs at discharge. During his hospitalization, we continued to trend his LFTs, which down trended appropriately.  In the final days of his hospitalization, the patient was able to tolerate p.o. intake with occasional emesis, which is unchanged for him. Counseled on alcohol  use throughout his hospitalization.   #Protein calorie malnutrition #Chronic pancreatic insufficiency #Physical deconditioning  #Nausea/vomiting Patient with history of chronic pancreatic insufficiency for which he is on Creon supplementation.  He has  had significant weight loss of up to 30 pounds in past 6 months.  BMI 15 with albumin of 1.8. Given significant malnutrition and downtrending albumin levels, did not feel it is safe to discharge the patient without further input from our GI colleagues.  They recommended Zofran and twice daily PPI for 12 weeks as well as Creon supplementation 36,000 once daily.  With Creon supplementation, patient gained about 10-11 pounds during this hospitalization.  PT/OT evaluated the patient and recommended home health therapies.  RD was consulted for their recommendations, and given his appropriate weight gain and adequate p.o. intake during his hospitalization, they also did not feel was necessary to start tube feeds.  Patient was discharged in stable condition; reemphasized the need to take Creon with all his meals.  #Iron deficiency anemia #Recent GI bleed Patient with history of iron deficiency anemia for which she is on iron supplementation.  He notes that he had not been able to tolerate his oral medications recently.  He did have a recent admission for GI bleed and noted to have esophagitis and nonbleeding anastomic ulcer with gastritis. Biopsy negative for H pylori.  During this hospitalization, hemoglobin trended 11.6-> 8.2 -> 7.9------>7.5.  Iron studies showed iron 60, ferritin 344, elevated TIBC, elevated sat ratios.  Vitamin B12 and folate were within normal limits.  However, patient denied any episodes of melena.  His home Protonix 40 mg twice daily was continued throughout his hospitalization, and his CBC was trended.  Patient was otherwise hemodynamically stable.   #Hypertension  Patient is on amlodipine at home for hypertension. BP's continue to be soft in the setting of dehydration.  Therefore, amlodipine was held during his hospitalization.  Discharge Exam:   BP 116/83 (BP Location: Right Arm)    Pulse 71    Temp 98.6 F (37 C) (Oral)    Resp 18    Ht $R'6\' 4"'lW$  (1.93 m)    Wt 60.1 kg    SpO2 100%     BMI 16.13 kg/m  Discharge exam:  Constitutional: Chronically ill and cachectic appearing middle-aged male in NAD HENT: Normocephalic and atraumatic, anicteric sclera.  Cardiovascular: Normal rate, regular rhythm, S1 and S2 present, no murmurs, rubs, gallops.  Respiratory: No respiratory distress, normal work of breathing GI: Flat, nondistended, non-tender, no hepatosplenomegaly, normal bowel sounds Musculoskeletal: Decreased muscle bulk, normal tone. No peripheral edema noted. Neurological: Is alert and oriented x4, no focal deficits appreciated Skin: Warm and dry. Psychiatric: Normal mood and affect. Behavior is normal.   Pertinent Labs, Studies, and Procedures:  CBC Latest Ref Rng & Units 10/14/2021 10/13/2021 10/12/2021  WBC 4.0 - 10.5 K/uL 11.0(H) 12.3(H) 10.5  Hemoglobin 13.0 - 17.0 g/dL 7.5(L) 7.7(L) 7.5(L)  Hematocrit 39.0 - 52.0 % 23.0(L) 23.9(L) 22.0(L)  Platelets 150 - 400 K/uL 115(L) 105(L) 106(L)   CMP Latest Ref Rng & Units 10/14/2021 10/13/2021 10/12/2021  Glucose 70 - 99 mg/dL 91 81 134(H)  BUN 6 - 20 mg/dL $Remove'6 7 6  'iqguZlL$ Creatinine 0.61 - 1.24 mg/dL 0.70 0.76 0.60(L)  Sodium 135 - 145 mmol/L 136 136 135  Potassium 3.5 - 5.1 mmol/L 4.0 3.9 3.2(L)  Chloride 98 - 111 mmol/L 105 105 106  CO2 22 - 32 mmol/L $RemoveB'27 25 22  'CbcHpGFj$ Calcium 8.9 - 10.3 mg/dL 7.9(L) 7.7(L) 7.3(L)  Total Protein 6.5 - 8.1 g/dL 4.1(L) 4.2(L) 3.8(L)  Total Bilirubin 0.3 - 1.2 mg/dL 0.4 0.5 0.4  Alkaline Phos 38 - 126 U/L 167(H) 174(H) 184(H)  AST 15 - 41 U/L 32 47(H) 80(H)  ALT 0 - 44 U/L 70(H) 93(H) 116(H)    DG Chest 2 View  Result Date: 10/09/2021 CLINICAL DATA:  Chest pain. EXAM: CHEST - 2 VIEW COMPARISON:  Chest radiograph dated 09/09/2021. FINDINGS: The heart size and mediastinal contours are within normal limits. Both lungs are clear. The visualized skeletal structures are unremarkable. IMPRESSION: No active cardiopulmonary disease. Electronically Signed   By: Anner Crete M.D.   On: 10/09/2021 02:15   US  Abdomen Limited RUQ (LIVER/GB)  Result Date: 10/09/2021 CLINICAL DATA:  56 year old male with history of elevated liver function tests. EXAM: ULTRASOUND ABDOMEN LIMITED RIGHT UPPER QUADRANT COMPARISON:  No prior abdominal ultrasound. CT the abdomen and pelvis 09/25/2021. FINDINGS: Gallbladder: Status post cholecystectomy. Common bile duct: Diameter: 2.9 mm Liver: Hyperechoic lesion in the left lobe of the liver with macrolobulated borders measuring 3.2 x 2.6 x 1.5 cm, which appears to correspond with area of probable focal fatty infiltration in this region on recent CT examination. Otherwise within normal limits in parenchymal echogenicity. Portal vein is patent on color Doppler imaging with normal direction of blood flow towards the liver. Other: None. IMPRESSION: 1. No acute findings. 2. Echogenic structure in the left lobe of the liver favored to represent an area of focal fatty infiltration given appearance on recent CT examination, which was also similar to remote prior CT study from 08/13/2021. This could be definitively characterized with nonemergent abdominal MRI with and without IV gadolinium if of clinical concern. 3. Status post cholecystectomy. Electronically Signed   By: Vinnie Langton M.D.   On: 10/09/2021 07:15     Discharge Instructions: Discharge Instructions     Call MD for:  persistant nausea and vomiting   Complete by: As directed    Call MD for:  severe uncontrolled pain   Complete by: As directed    Diet - low sodium heart healthy   Complete by: As directed    Discharge instructions   Complete by: As directed    Mr. Fearnow,  It was a pleasure taking care of you during this admission.  You were hospitalized for nausea and vomiting found to have elevated liver enzyme.  The hepatitis is likely due to dehydration and alcohol. Your liver enzyme are trending down and almost back to normal.  You have lost significant weights because you were off of Creon.  Creon will help you  absorb the nutrition from your food.  Please continue Creon 36000 units daily with meals.  I have prescribed Protonix to help with acid reduction in the stomach and Phenergan for your nausea.  Please follow-up with your primary care doctor in 1-2 weeks.  Take care,  Dr. Alfonse Spruce   Increase activity slowly   Complete by: As directed    No wound care   Complete by: As directed        Signed: Orvis Brill, MD 10/14/2021, 10:14 AM   Pager: [phone]

## 2021-10-14 ENCOUNTER — Other Ambulatory Visit: Payer: Self-pay

## 2021-10-14 DIAGNOSIS — B179 Acute viral hepatitis, unspecified: Secondary | ICD-10-CM | POA: Diagnosis not present

## 2021-10-14 LAB — COMPREHENSIVE METABOLIC PANEL
ALT: 70 U/L — ABNORMAL HIGH (ref 0–44)
AST: 32 U/L (ref 15–41)
Albumin: 1.7 g/dL — ABNORMAL LOW (ref 3.5–5.0)
Alkaline Phosphatase: 167 U/L — ABNORMAL HIGH (ref 38–126)
Anion gap: 4 — ABNORMAL LOW (ref 5–15)
BUN: 6 mg/dL (ref 6–20)
CO2: 27 mmol/L (ref 22–32)
Calcium: 7.9 mg/dL — ABNORMAL LOW (ref 8.9–10.3)
Chloride: 105 mmol/L (ref 98–111)
Creatinine, Ser: 0.7 mg/dL (ref 0.61–1.24)
GFR, Estimated: >60 mL/min (ref >60)
Glucose, Bld: 91 mg/dL (ref 70–99)
Potassium: 4 mmol/L (ref 3.5–5.1)
Sodium: 136 mmol/L (ref 135–145)
Total Bilirubin: 0.4 mg/dL (ref 0.3–1.2)
Total Protein: 4.1 g/dL — ABNORMAL LOW (ref 6.5–8.1)

## 2021-10-14 LAB — CBC
HCT: 23 % — ABNORMAL LOW (ref 39.0–52.0)
Hemoglobin: 7.5 g/dL — ABNORMAL LOW (ref 13.0–17.0)
MCH: 30.1 pg (ref 26.0–34.0)
MCHC: 32.6 g/dL (ref 30.0–36.0)
MCV: 92.4 fL (ref 80.0–100.0)
Platelets: 115 10*3/uL — ABNORMAL LOW (ref 150–400)
RBC: 2.49 MIL/uL — ABNORMAL LOW (ref 4.22–5.81)
RDW: 15.7 % — ABNORMAL HIGH (ref 11.5–15.5)
WBC: 11 10*3/uL — ABNORMAL HIGH (ref 4.0–10.5)
nRBC: 0.2 % (ref 0.0–0.2)

## 2021-10-14 LAB — ANTI-SMOOTH MUSCLE ANTIBODY, IGG: F-Actin IgG: 10 Units (ref 0–19)

## 2021-10-14 LAB — MITOCHONDRIAL ANTIBODIES: Mitochondrial M2 Ab, IgG: <20 Units (ref 0.0–20.0)

## 2021-10-14 LAB — MAGNESIUM: Magnesium: 1.7 mg/dL (ref 1.7–2.4)

## 2021-10-14 MED ORDER — PANTOPRAZOLE SODIUM 40 MG PO TBEC
40.0000 mg | DELAYED_RELEASE_TABLET | Freq: Two times a day (BID) | ORAL | 0 refills | Status: DC
Start: 2021-10-14 — End: 2022-01-10
  Filled 2021-10-14: qty 60, 30d supply, fill #0

## 2021-10-14 MED ORDER — PANCRELIPASE (LIP-PROT-AMYL) 12000-38000 UNITS PO CPEP
12000.0000 [IU] | ORAL_CAPSULE | Freq: Two times a day (BID) | ORAL | 0 refills | Status: AC | PRN
Start: 2021-10-14 — End: 2021-11-13
  Filled 2021-10-14: qty 60, 30d supply, fill #0

## 2021-10-14 MED ORDER — PROMETHAZINE HCL 25 MG PO TABS
25.0000 mg | ORAL_TABLET | Freq: Four times a day (QID) | ORAL | 0 refills | Status: DC | PRN
  Filled 2021-10-14 (×2): qty 30, 8d supply, fill #0

## 2021-10-14 MED ORDER — RAMELTEON 8 MG PO TABS
8.0000 mg | ORAL_TABLET | Freq: Every day | ORAL | 0 refills | Status: DC
Start: 2021-10-14 — End: 2022-02-03
  Filled 2021-10-14: qty 30, 30d supply, fill #0

## 2021-10-14 NOTE — Progress Notes (Signed)
Mobility Specialist Progress Note:   10/14/21 1050  Mobility  Activity Ambulated in hall  Level of Assistance Contact guard assist, steadying assist  Assistive Device Other (Comment) (IV Pole)  Distance Ambulated (ft) 250 ft  Mobility Ambulated with assistance in hallway  Mobility Response Tolerated well  Mobility performed by Mobility specialist  $Mobility charge 1 Mobility   Pt required minimal physical assistance. Contact guard required d/t unsteadiness. Pt c/o LLE weakness/tingling. Otherwise asx, pt back in bed with all needs met.   Nelta Numbers Mobility Specialist  Phone [phone]

## 2021-10-14 NOTE — Progress Notes (Shared)
Subjective:  ON- None  Patient assessed at bedside this AM. Patient states that he had several episodes of diarrhea after taking miralax yesterday. Otherwise, he does not have any other complaints or concerns at this time.  Objective:  Vital signs in last 24 hours: Vitals:   10/13/21 0751 10/13/21 1651 10/13/21 2006 10/14/21 0517  BP: 120/83 106/79 114/87 116/88  Pulse: 64 76 81 72  Resp: _0 Temp: 98.6 F (37 C) 98.5 F (36.9 C) 98.3 F (36.8 C) 99 F (37.2 C)  TempSrc: Oral Oral Oral Oral  SpO2: 100% 100% 100% 100%  Weight:      Height:       Constitutional: Chronically ill and cachectic appearing middle-aged male in NAD HENT: Normocephalic and atraumatic, anicteric sclera.  Cardiovascular: Normal rate, regular rhythm, S1 and S2 present, no murmurs, rubs, gallops.  Respiratory: No respiratory distress, normal work of breathing GI: Flat, nondistended, non-tender, no hepatosplenomegaly, normal bowel sounds Musculoskeletal: Decreased muscle bulk, normal tone. No peripheral edema noted. Neurological: Is alert and oriented x4, no focal deficits appreciated Skin: Warm and dry. Psychiatric: Normal mood and affect. Behavior is normal.   Assessment/Plan:  Principal Problem:   Acute hepatitis Active Problems:   Elevated LFTs   Gastric ulcer without hemorrhage or perforation   ON NAE Vitals afebrile and HDS AST 32 ALT 70, downtrending nicely Magnesium 1.7 Hgb stable at 7.5 Weight 60.1 kg  DC today   Brent Rogers is a 56 y.o. with pertinent PMH of hypertension, chronic pancreatitis on Creon status post Whipple procedure, recent admission for GI bleed, iron deficiency anemia, protein calorie malnutrition who presented with nausea and vomiting and admitted for acute hepatitis.  We will plan for discharge tomorrow.  #Acute alcoholic hepatitis  Secondary to alcoholic hepatitis in combination with shock liver. AST, ALT and alk phos continue to trend down  nicely. Total bilirubin normal.  His p.o. intake is improving with less emesis episodes.  Plan for discharge tomorrow. -Appreciate GI recommendations -Pending autoimmune hepatitis labs   #Protein calorie malnutrition #Chronic pancreatic insufficiency #Physical deconditioning  Patient with history of chronic pancreatic insufficiency for which he is on Creon supplementation.  He has had significant weight loss of up to 30 pounds in past 6 months.  BMI 15 with albumin of 1.8. RD was consulted and recommended core track placement. Spoke to [place], [place] about his intake and she felt more comfortable with his improving p.o. intake.  He also continues to gain weight appropriately in the hospital.  Therefore, no indication to place feeding tube at this time.  We will continue to assess his p.o. intake prior to discharge. - Appreciate RD recommendations - Creon supplementation - PT/OT eval  - Bowel regimen for constipation   #Normocytic anemia #Recent GI bleed Patient with history of iron deficiency anemia on home ferrous sulfate.  Hemoglobin is low but has been stable the past few days, at 7.7 today. B12 and folate were within normal limits.  Ferritin was 344 with iron sat of 50.  Suspect IDA from esophagitis/gastritis in combination with anemia of chronic disease. - Continue home protonix 44m bid - Trend CBC - If continues to trend down, may need blood transfusion   #Hypertension  Patient is on amlodipine at home for hypertension. BP's are low to normal. - Holding home amlodipine in the setting of low normal blood pressures  Diet: reg Full code IVF: N/A DVT: SCDs   Prior to Admission Living Arrangement:  Home Anticipated Discharge Location: Home Dispo: Anticipated discharge in approximately 0-1 days  Orvis Brill, MD 10/14/2021, 6:32 AM Pager: [phone]  After 5pm on weekdays and 1pm on weekends: On Call pager [phone]

## 2021-10-14 NOTE — TOC Transition Note (Signed)
Transition of Care (TOC) - CM/SW Discharge Note Marvetta Gibbons RN, BSN Transitions of Care Unit 4E- RN Case Manager See Treatment Team for direct phone #  Cross coverage for 6N  Patient Details  Name: AUL MANGIERI MRN: [id number] Date of Birth: 1965/11/23  Transition [facility]) CM/SW Contact:  Dawayne Patricia, RN Phone Number: 10/14/2021, 2:38 PM   Clinical Narrative:    Pt stable for transition home, verbal order given for outpt PT referral. Referral placed to [facility] outpt rehab on [address].  Info placed on AVS.   Received msg from bedside RN regarding need for cane, order placed and call made to [employer] for DME- cane to be delivered to room prior to discharge. Pt may also need assist with transportation, CSW to f/u on transportation needs.    Final next level of care: OP Rehab Barriers to Discharge: No Barriers Identified   Patient Goals and CMS Choice    In house provider    Discharge Placement               Home        Discharge Plan and Services   Discharge Planning Services: CM Consult            DME Arranged: Kasandra Knudsen DME Agency: [employer] Date DME Agency Contacted: 10/14/21 Time DME Agency Contacted: 1107 Representative spoke with at DME Agency: Freda Munro HH Arranged: NA [place] Agency: NA        Social Determinants of Health ([place]) Interventions     Readmission Risk Interventions Readmission Risk Prevention Plan 10/14/2021  Transportation Screening Complete  PCP or Specialist Appt within 3-5 Days Complete  HRI or [place] Complete  Social Work Consult for [place] Planning/Counseling Complete  Palliative Care Screening Not Applicable  Medication Review Press photographer) Complete  Some recent data might be hidden

## 2021-10-15 ENCOUNTER — Telehealth: Payer: Self-pay

## 2021-10-15 ENCOUNTER — Other Ambulatory Visit: Payer: Self-pay | Admitting: Student

## 2021-10-15 DIAGNOSIS — R5381 Other malaise: Secondary | ICD-10-CM

## 2021-10-15 NOTE — Telephone Encounter (Signed)
Transition Care Management Follow-up Telephone Call Date of discharge and from where: 10/14/2021, [facility]  How have you been since you were released from the hospital? He said he just got home yesterday and still feels weak, not really eating yet. Just resting.  Any questions or concerns? No  Items Reviewed: Did the pt receive and understand the discharge instructions provided? Yes  Medications obtained and verified?  He only has creon.  He said that the other medications are at [place] and he doesn't have a ride today to go to pick them up. He said that the pharmacy has mailed them to him in the past and he has the number for the pharmacy to call and request these meds that he needs be mailed to him. He stated that he has a charge account with the pharmacy and makes payments on it when he comes to the clinic.  Other? No  Any new allergies since your discharge? No  Dietary orders reviewed? Yes Do you have support at home? Yes , in the evening someone comes to check on him.   Home Care and Equipment/Supplies: Were home health services ordered? no If so, what is the name of the agency? N/a  Has the agency set up a time to come to the patient's home? not applicable Were any new equipment or medical supplies ordered?  Yes: cane What is the name of the medical supply agency? [employer] [facility] Were you able to get the supplies/equipment? yes Do you have any questions related to the use of the equipment or supplies? No  Has been referred to outpatient PT and he said they already called him but need to call him back after "checking with the doctor."  Functional Questionnaire: (I = Independent and D = Dependent) ADLs: independent. Has cane to use with ambulation as needed.   Follow up appointments reviewed:  PCP Hospital f/u appt confirmed?  He said he will call to schedule an appointment.    [facility] f/u appt confirmed? Yes  Scheduled to see GI - 10/28/2021.  Are  transportation arrangements needed? No  If their condition worsens, is the pt aware to call PCP or go to the Emergency Dept.? Yes Was the patient provided with contact information for the PCP's office or ED? Yes Was to pt encouraged to call back with questions or concerns? Yes

## 2021-10-16 ENCOUNTER — Other Ambulatory Visit: Payer: Self-pay

## 2021-10-25 ENCOUNTER — Ambulatory Visit: Payer: Medicaid Other | Admitting: Physician Assistant

## 2021-10-28 ENCOUNTER — Ambulatory Visit: Payer: Medicaid Other | Admitting: Physician Assistant

## 2021-10-28 ENCOUNTER — Other Ambulatory Visit: Payer: Self-pay

## 2021-11-06 ENCOUNTER — Ambulatory Visit: Payer: Medicaid Other

## 2021-11-12 NOTE — Therapy (Incomplete)
OUTPATIENT PHYSICAL THERAPY LOWER EXTREMITY EVALUATION   Patient Name: Brent Rogers MRN: [id number] DOB:05-14-1966, 56 y.o., male Today's Date: 11/12/2021    Past Medical History:  Diagnosis Date   Benign tumor of endocrine pancreas    Chronic pancreatitis ([place])    S/P [place]   Foot ulcer with fat layer exposed ([place])    Hypertension    Migraine    "@ least once/month" (11/12/2015)   Pancreatic abnormality    CT  shows mass   Pneumonia 11/12/2015   Scoliosis    Past Surgical History:  Procedure Laterality Date   BACK SURGERY     BIOPSY  09/26/2021   Procedure: BIOPSY;  Surgeon: Ladene Artist, MD;  Location: [facility] ENDOSCOPY;  Service: Endoscopy;;   COLONOSCOPY WITH PROPOFOL N/A 03/11/2021   Procedure: COLONOSCOPY WITH PROPOFOL;  Surgeon: Carol Ada, MD;  Location: [place];  Service: Endoscopy;  Laterality: N/A;   ESOPHAGOGASTRODUODENOSCOPY (EGD) WITH PROPOFOL N/A 03/11/2021   Procedure: ESOPHAGOGASTRODUODENOSCOPY (EGD) WITH PROPOFOL;  Surgeon: Carol Ada, MD;  Location: [place];  Service: Endoscopy;  Laterality: N/A;   ESOPHAGOGASTRODUODENOSCOPY (EGD) WITH PROPOFOL N/A 09/26/2021   Procedure: ESOPHAGOGASTRODUODENOSCOPY (EGD) WITH PROPOFOL;  Surgeon: Ladene Artist, MD;  Location: [facility] ENDOSCOPY;  Service: Endoscopy;  Laterality: N/A;   EUS N/A 09/21/2013   Procedure: ESOPHAGEAL ENDOSCOPIC ULTRASOUND (EUS) RADIAL;  Surgeon: Beryle Beams, MD;  Location: [facility] ENDOSCOPY;  Service: Endoscopy;  Laterality: N/A;   EUS N/A 09/30/2013   Procedure: UPPER ENDOSCOPIC ULTRASOUND (EUS) LINEAR;  Surgeon: Beryle Beams, MD;  Location: [facility] ENDOSCOPY;  Service: Endoscopy;  Laterality: N/A;   FINE NEEDLE ASPIRATION N/A 09/21/2013   Procedure: FINE NEEDLE ASPIRATION (FNA) LINEAR;  Surgeon: Beryle Beams, MD;  Location: [facility] ENDOSCOPY;  Service: Endoscopy;  Laterality: N/A;   FINGER FRACTURE SURGERY Left 1995   5th digit   FRACTURE SURGERY     HEMOSTASIS CLIP PLACEMENT  03/11/2021    Procedure: HEMOSTASIS CLIP PLACEMENT;  Surgeon: Carol Ada, MD;  Location: [facility] ENDOSCOPY;  Service: Endoscopy;;   HEMOSTASIS CONTROL  03/11/2021   Procedure: HEMOSTASIS CONTROL;  Surgeon: Carol Ada, MD;  Location: [place];  Service: Endoscopy;;   KNEE ARTHROSCOPY Bilateral 1987-1989   right-left   LAPAROSCOPY N/A 12/01/2013   Procedure: LAPAROSCOPY DIAGNOSTIC PANCREATICODUODENECTOMY WITH BILIARY AND PANCREATIC STENTS;  Surgeon: Stark Klein, MD;  Location: [facility] ORS;  Service: General;  Laterality: N/A;   LUMBAR [place] SURGERY  1990's   SAVORY DILATION N/A 03/11/2021   Procedure: SAVORY DILATION;  Surgeon: Carol Ada, MD;  Location: [place];  Service: Endoscopy;  Laterality: N/A;   WHIPPLE PROCEDURE  12/01/2013   Patient Active Problem List   Diagnosis Date Noted   Elevated LFTs    Gastric ulcer without hemorrhage or perforation    Acute hepatitis 10/09/2021   Pressure injury of skin 09/26/2021   GI bleed 09/25/2021   Major depressive disorder, recurrent episode, severe ([place]) 09/04/2021   GAD (generalized anxiety disorder) 09/04/2021   Major depressive disorder, recurrent episode, moderate ([place]) 03/24/2021   Open wound of pharynx without complication 11/91/4782   Atherosclerosis of native coronary artery of native heart without angina pectoris    Blood in stool    Iron deficiency anemia    Syncope 03/06/2021   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  ONSET DATE: R53.81 (ICD-10-CM) - Physical deconditioning  SUBJECTIVE:   SUBJECTIVE STATEMENT: ***  PERTINENT HISTORY: ***  PAIN:  Are you having pain? {yes/no:20286} NPRS scale: ***/10 Pain location: *** Pain orientation: {Pain Orientation:25161}  PAIN TYPE: {type:313116} Pain description: {PAIN DESCRIPTION:21022940}  Aggravating factors: *** Relieving factors: ***  PRECAUTIONS: {Therapy precautions:24002}  WEIGHT BEARING RESTRICTIONS {Yes ***/No:24003}  FALLS:  Has patient fallen in last 6 months? {yes/no:20286}, Number of falls: ***  LIVING ENVIRONMENT: Lives with: {[facility] lives with:25569::"lives with their family"} Lives in: {Lives in:25570} Stairs: {yes/no:20286}; {Stairs:24000} Has following equipment at home: {Assistive devices:23999}  OCCUPATION: ***  PLOF: {PLOF:24004}  PATIENT GOALS ***   OBJECTIVE:   DIAGNOSTIC FINDINGS: None relevant to current condition  PATIENT SURVEYS:  {rehab surveys:24030}  COGNITION:  Overall cognitive status: {cognition:24006}     SENSATION:  Light touch: {intact/deficits:24005}    MUSCLE LENGTH: Hamstrings: Right *** deg; Left *** deg Thomas test: Right *** deg; Left *** deg  POSTURE:  ***  PALPATION: ***   LE MMT:  MMT Right 11/12/2021 Left 11/12/2021  Hip flexion    Hip extension    Hip abduction    Hip adduction    Hip internal rotation    Hip external rotation    Knee flexion    Knee extension    Ankle dorsiflexion    Ankle plantarflexion    Ankle inversion    Ankle eversion     (Blank rows = not tested)   FUNCTIONAL TESTS:  5xSTS: 6MWT: Squat:  GAIT: Distance walked: *** Assistive device utilized: {Assistive devices:23999} Level of assistance: {Levels of assistance:24026} Comments: ***    TODAY'S TREATMENT: ***   PATIENT EDUCATION:  Education details: *** Person educated: {Person  educated:25204} Education method: {Education Method:25205} Education comprehension: {Education Comprehension:25206}   HOME EXERCISE PROGRAM: ***  ASSESSMENT:  CLINICAL IMPRESSION: Patient is a *** y.o. *** who was seen today for physical therapy evaluation and treatment for ***. Objective impairments include {opptimpairments:25111}. These impairments are limiting patient from {activity limitations:25113}. Personal factors including {Personal factors:25162} are also affecting patient's functional outcome. Patient will benefit from skilled PT to address above impairments and improve overall function.  REHAB POTENTIAL: {rehabpotential:25112}  CLINICAL DECISION MAKING: {clinical decision making:25114}  EVALUATION COMPLEXITY: {Evaluation complexity:25115}   GOALS: Goals reviewed with patient? {yes/no:20286}  SHORT TERM GOALS:  STG Name Target Date Goal status  1 *** Baseline:  {follow up:25551} {GOALSTATUS:25110}  2 *** Baseline:  {follow up:25551} {GOALSTATUS:25110}  3 *** Baseline: {follow up:25551} {GOALSTATUS:25110}  4 *** Baseline: {follow up:25551} {GOALSTATUS:25110}  5 *** Baseline: {follow up:25551} {GOALSTATUS:25110}  6 *** Baseline: {follow up:25551} {GOALSTATUS:25110}  7 *** Baseline: {follow up:25551} {GOALSTATUS:25110}   LONG TERM GOALS:   LTG Name Target Date Goal status  1 *** Baseline: {follow up:25551} {GOALSTATUS:25110}  2 *** Baseline: {follow up:25551} {GOALSTATUS:25110}  3 *** Baseline: {follow up:25551} {GOALSTATUS:25110}  4 *** Baseline: {follow up:25551} {GOALSTATUS:25110}  5 *** Baseline: {follow up:25551} {GOALSTATUS:25110}  6 *** Baseline: {follow up:25551} {GOALSTATUS:25110}  7 *** Baseline: {follow up:25551} {GOALSTATUS:25110}   PLAN: PT FREQUENCY: {rehab frequency:25116}  PT DURATION: {rehab duration:25117}  PLANNED INTERVENTIONS: {rehab planned  interventions:25118::"Therapeutic exercises","Therapeutic activity","Neuro Muscular  re-education","Balance training","Gait training","Patient/Family education","Joint mobilization"}  PLAN FOR NEXT SESSION: Vanessa Kremlin, PT, DPT 11/12/21 3:14 PM

## 2021-11-13 ENCOUNTER — Ambulatory Visit: Payer: Medicaid Other | Attending: Student in an Organized Health Care Education/Training Program

## 2021-12-03 ENCOUNTER — Encounter (HOSPITAL_COMMUNITY): Payer: Self-pay

## 2021-12-03 ENCOUNTER — Emergency Department (HOSPITAL_COMMUNITY): Payer: Medicaid Other

## 2021-12-03 ENCOUNTER — Emergency Department (HOSPITAL_COMMUNITY)
Admission: EM | Admit: 2021-12-03 | Discharge: 2021-12-04 | Disposition: A | Discharge status: Home / Self Care | Payer: Medicaid Other | Attending: Emergency Medicine | Admitting: Emergency Medicine

## 2021-12-03 ENCOUNTER — Other Ambulatory Visit: Payer: Self-pay

## 2021-12-03 DIAGNOSIS — M25511 Pain in right shoulder: Secondary | ICD-10-CM | POA: Insufficient documentation

## 2021-12-03 DIAGNOSIS — Z87891 Personal history of nicotine dependence: Secondary | ICD-10-CM | POA: Insufficient documentation

## 2021-12-03 DIAGNOSIS — W010XXA Fall on same level from slipping, tripping and stumbling without subsequent striking against object, initial encounter: Secondary | ICD-10-CM | POA: Diagnosis not present

## 2021-12-03 DIAGNOSIS — M25461 Effusion, right knee: Secondary | ICD-10-CM | POA: Diagnosis not present

## 2021-12-03 DIAGNOSIS — Z79899 Other long term (current) drug therapy: Secondary | ICD-10-CM | POA: Diagnosis not present

## 2021-12-03 DIAGNOSIS — R112 Nausea with vomiting, unspecified: Secondary | ICD-10-CM

## 2021-12-03 DIAGNOSIS — R1084 Generalized abdominal pain: Secondary | ICD-10-CM | POA: Diagnosis not present

## 2021-12-03 DIAGNOSIS — I10 Essential (primary) hypertension: Secondary | ICD-10-CM | POA: Insufficient documentation

## 2021-12-03 LAB — COMPREHENSIVE METABOLIC PANEL
ALT: 33 U/L (ref 0–44)
AST: 56 U/L — ABNORMAL HIGH (ref 15–41)
Albumin: 3 g/dL — ABNORMAL LOW (ref 3.5–5.0)
Alkaline Phosphatase: 132 U/L — ABNORMAL HIGH (ref 38–126)
Anion gap: 9 (ref 5–15)
BUN: 6 mg/dL (ref 6–20)
CO2: 20 mmol/L — ABNORMAL LOW (ref 22–32)
Calcium: 8.3 mg/dL — ABNORMAL LOW (ref 8.9–10.3)
Chloride: 109 mmol/L (ref 98–111)
Creatinine, Ser: 0.77 mg/dL (ref 0.61–1.24)
GFR, Estimated: >60 mL/min (ref >60)
Glucose, Bld: 113 mg/dL — ABNORMAL HIGH (ref 70–99)
Potassium: 3.5 mmol/L (ref 3.5–5.1)
Sodium: 138 mmol/L (ref 135–145)
Total Bilirubin: 0.5 mg/dL (ref 0.3–1.2)
Total Protein: 6.6 g/dL (ref 6.5–8.1)

## 2021-12-03 LAB — URINALYSIS, ROUTINE W REFLEX MICROSCOPIC
Bilirubin Urine: NEGATIVE
Glucose, UA: NEGATIVE mg/dL
Hgb urine dipstick: NEGATIVE
Ketones, ur: 5 mg/dL — AB
Leukocytes,Ua: NEGATIVE
Nitrite: NEGATIVE
Protein, ur: NEGATIVE mg/dL
Specific Gravity, Urine: 1.02 (ref 1.005–1.030)
pH: 5 (ref 5.0–8.0)

## 2021-12-03 LAB — LIPASE, BLOOD: Lipase: 20 U/L (ref 11–51)

## 2021-12-03 LAB — CBC
HCT: 36.9 % — ABNORMAL LOW (ref 39.0–52.0)
Hemoglobin: 11.7 g/dL — ABNORMAL LOW (ref 13.0–17.0)
MCH: 29.5 pg (ref 26.0–34.0)
MCHC: 31.7 g/dL (ref 30.0–36.0)
MCV: 92.9 fL (ref 80.0–100.0)
Platelets: 268 10*3/uL (ref 150–400)
RBC: 3.97 MIL/uL — ABNORMAL LOW (ref 4.22–5.81)
RDW: 14.6 % (ref 11.5–15.5)
WBC: 9.3 10*3/uL (ref 4.0–10.5)
nRBC: 0 % (ref 0.0–0.2)

## 2021-12-03 MED ORDER — SODIUM CHLORIDE 0.9 % IV BOLUS
1000.0000 mL | Freq: Once | INTRAVENOUS | Status: AC
  Administered 2021-12-03: 1000 mL via INTRAVENOUS

## 2021-12-03 MED ORDER — ONDANSETRON HCL 4 MG/2ML IJ SOLN
4.0000 mg | Freq: Once | INTRAMUSCULAR | Status: AC
  Administered 2021-12-03: 4 mg via INTRAVENOUS
  Filled 2021-12-03: qty 2

## 2021-12-03 MED ORDER — ONDANSETRON HCL 4 MG PO TABS: 4.0000 mg | ORAL_TABLET | Freq: Three times a day (TID) | ORAL | 0 refills | Status: DC | PRN

## 2021-12-03 NOTE — ED Triage Notes (Signed)
Pt arrives with c/o epigastric pain that started Friday. Pt endorses n/v, chest pain, and SOB. Pt has hx of chronic pancreatitis.  ?

## 2021-12-03 NOTE — ED Notes (Addendum)
2x attempted sticks, was unsuccessful.  ?

## 2021-12-03 NOTE — ED Provider Triage Note (Signed)
Emergency Medicine Provider Triage Evaluation Note ? ?Brent Rogers , a 56 y.o. male  was evaluated in triage.  Pt complains of abdominal pain x a few days, similar to prior pancreatitis, nausea and vomiting (non bloody, non bilious). Reports constipation and diarrhea (both), last bowel movement at 2 PM today, states this is what happens with his pancreatitis. ALSO fall today, pain in right knee and left shoulder. ? ?Review of Systems  ?Positive: Abdominal pain, n/v/d ?Negative: fever ? ?Physical Exam  ?BP 123/88 (BP Location: Right Arm)   Pulse 99   Temp 98 ?F (36.7 ?C)   Resp 16   Ht '6\' 3"'$  (1.905 m)   SpO2 100%   BMI 16.56 kg/m?  ?Gen:   Awake, no distress   ?Resp:  Normal effort  ?MSK:   Moves extremities without difficulty, pain medial joint line right knee, generalized left shoulder tenderness ?Other:   ? ?Medical Decision Making  ?Medically screening exam initiated at 4:39 PM.  Appropriate orders placed.  Brent Rogers was informed that the remainder of the evaluation will be completed by another provider, this initial triage assessment does not replace that evaluation, and the importance of remaining in the ED until their evaluation is complete. ? ? ?  ?Tacy Learn, PA-C ?12/03/21 1640 ? ?

## 2021-12-03 NOTE — ED Provider Notes (Signed)
[facility] DEPT Provider Note   CSN: [id number] Arrival date & time: 12/03/21  1550     History  Chief Complaint  Patient presents with   Abdominal Pain    Brent Rogers is a 56 y.o. male.  The patient presents to the emergency department today complaining of nausea and vomiting that began Friday.  He states that he has very minimal abdominal pain.  He has been able to tolerate small amounts of fluid but no solid foods.  He is concerned that this presentation is similar to episodes of pancreatitis he has had in the past.  While leaving his house today, the patient tripped and fell landing on his right shoulder and right knee.  He does complain of pain in both locations.The patient has past medical history significant for protein calorie malnutrition, tobacco abuse, hypertension, status postcholecystectomy, GERD, pancreatic insufficiency, fatty liver, chronic pancreatitis, history of partial pancreatectomy, chronic pain syndrome, depression, iron deficiency anemia, major depressive disorder, elevated LFTs, history of acute hepatitis, history of GI bleed, gastric ulcer without hemorrhage or perforation.  HPI     Home Medications Prior to Admission medications   Medication Sig Start Date End Date Taking? Authorizing Provider  ondansetron (ZOFRAN) 4 MG tablet Take 1 tablet (4 mg total) by mouth every 8 (eight) hours as needed for nausea or vomiting. 12/03/21  Yes Dorothyann Peng, PA-C  lipase/protease/amylase (CREON) 36000 UNITS CPEP capsule Take 1 capsule (36,000 Units total) by mouth with breakfast, with lunch, and with evening meal. 09/27/21   Delene Ruffini, MD  pantoprazole (PROTONIX) 40 MG tablet Take 1 tablet (40 mg total) by mouth 2 (two) times daily. 10/14/21 11/13/21  Gaylan Gerold, DO  promethazine (PHENERGAN) 25 MG tablet Take 1 tablet (25 mg total) by mouth every 6 (six) hours as needed for nausea or vomiting. 10/14/21   Gaylan Gerold, DO  ramelteon  (ROZEREM) 8 MG tablet Take 1 tablet (8 mg total) by mouth at bedtime. 10/14/21 11/13/21  Gaylan Gerold, DO      Allergies    Patient has no known allergies.    Review of Systems   Review of Systems  Constitutional:  Negative for fever and unexpected weight change.  Respiratory:  Negative for shortness of breath.   Cardiovascular:  Negative for chest pain.  Gastrointestinal:  Positive for abdominal pain, nausea and vomiting. Negative for diarrhea.  Genitourinary:  Negative for dysuria and flank pain.  Musculoskeletal:  Positive for arthralgias and back pain.   Physical Exam Updated Vital Signs BP (!) 142/98    Pulse 71    Temp 98 F (36.7 C)    Resp 15    Ht '6\' 3"'$  (1.905 m)    SpO2 100%    BMI 16.56 kg/m  Physical Exam Vitals and nursing note reviewed.  Constitutional:      General: He is not in acute distress. HENT:     Head: Normocephalic.     Mouth/Throat:     Mouth: Mucous membranes are moist.  Eyes:     Pupils: Pupils are equal, round, and reactive to light.  Cardiovascular:     Rate and Rhythm: Normal rate and regular rhythm.     Heart sounds: Normal heart sounds.  Pulmonary:     Effort: Pulmonary effort is normal. No respiratory distress.     Breath sounds: Normal breath sounds.  Abdominal:     General: Abdomen is flat. There is no distension.     Palpations: Abdomen is  soft.     Tenderness: There is abdominal tenderness (Mild generalized abdominal tenderness).  Musculoskeletal:     Right shoulder: Tenderness present. No swelling.     Right knee: Effusion present. No deformity. Tenderness present.  Skin:    General: Skin is warm and dry.  Neurological:     Mental Status: He is alert.    ED Results / Procedures / Treatments   Labs (all labs ordered are listed, but only abnormal results are displayed) Labs Reviewed  COMPREHENSIVE METABOLIC PANEL - Abnormal; Notable for the following components:      Result Value   CO2 20 (*)    Glucose, Bld 113 (*)    Calcium  8.3 (*)    Albumin 3.0 (*)    AST 56 (*)    Alkaline Phosphatase 132 (*)    All other components within normal limits  CBC - Abnormal; Notable for the following components:   RBC 3.97 (*)    Hemoglobin 11.7 (*)    HCT 36.9 (*)    All other components within normal limits  URINALYSIS, ROUTINE W REFLEX MICROSCOPIC - Abnormal; Notable for the following components:   Ketones, ur 5 (*)    All other components within normal limits  LIPASE, BLOOD    EKG EKG Interpretation  Date/Time:  Tuesday December 03 2021 16:02:14 EST Ventricular Rate:  95 PR Interval:  153 QRS Duration: 89 QT Interval:  369 QTC Calculation: 464 R Axis:   77 Text Interpretation: Sinus rhythm Biatrial enlargement No acute changes No significant change since last tracing Confirmed by Varney Biles ([id number]) on 12/03/2021 8:47:53 PM  Radiology DG Shoulder Left  Result Date: 12/03/2021 CLINICAL DATA:  Fall, pain. EXAM: LEFT SHOULDER - 2+ VIEW COMPARISON:  None. FINDINGS: There is no evidence of fracture or dislocation. There is no evidence of arthropathy or other focal bone abnormality. Soft tissues are unremarkable. IMPRESSION: Negative. Electronically Signed   By: Ronney Asters M.D.   On: 12/03/2021 17:02   DG Knee Complete 4 Views Right  Result Date: 12/03/2021 CLINICAL DATA:  Fall. EXAM: RIGHT KNEE - COMPLETE 4+ VIEW COMPARISON:  None. FINDINGS: Moderate joint effusion present. There is no evidence for acute fracture or dislocation. There is mild medial compartment joint space narrowing and osteophyte formation compatible with early degenerative change. Bony exostosis noted from the posterior distal femoral diaphysis, measuring 3.0 by 0.9 by 0.4 cm. Soft tissues are otherwise within normal limits. IMPRESSION: 1. Moderate size joint effusion. 2. No acute fracture or dislocation. 3. Early degenerative changes. 4. Bony exostosis from the distal femoral diaphysis, indeterminate. Correlate clinically for point tenderness. This  can be further evaluated and characterized with MRI. Electronically Signed   By: Ronney Asters M.D.   On: 12/03/2021 17:07    Procedures Procedures    Medications Ordered in ED Medications  sodium chloride 0.9 % bolus 1,000 mL (1,000 mLs Intravenous New Bag/Given 12/03/21 2121)  ondansetron (ZOFRAN) injection 4 mg (4 mg Intravenous Given 12/03/21 2132)    ED Course/ Medical Decision Making/ A&P                           Medical Decision Making Amount and/or Complexity of Data Reviewed Labs: ordered.  Risk Prescription drug management.   This patient presents to the ED for concern of nausea and vomiting, this involves an extensive number of treatment options, and is a complaint that carries with it a high risk of complications  and morbidity.  The differential diagnosis includes but is not limited to pancreatitis, gastroenteritis, appendicitis, and others   Co morbidities that complicate the patient evaluation  Hx pancreatitis, GERD   Additional history obtained:  External records from outside source obtained and reviewed including hospital notes from 10/14/21   Lab Tests:  I Ordered, and personally interpreted labs.  The pertinent results include:  Lipase 20, albumin 3   Imaging Studies ordered:  I ordered imaging studies including plain films of the right knee and shoulder  I independently visualized and interpreted imaging which showed no fracture of the knee or shoulder, effusion noted at knee I agree with the radiologist interpretation   Cardiac Monitoring:  The patient was maintained on a cardiac monitor.  I personally viewed and interpreted the cardiac monitored which showed an underlying rhythm of: Normal sinus rhythm   Medicines ordered and prescription drug management:  I ordered medication including zofran for nausea  Reevaluation of the patient after these medicines showed that the patient improved I have reviewed the patients home medicines and have made  adjustments as needed   Test Considered:  CT abdomen   Social Determinants of Health:  Transportation issues  The patient presents with nausea and vomiting. The patient has minimal abdominal tenderness which makes appendicitis unlikely. The patient has a normal lipase level showing no pancreatitis. His lab values overall are consistent with previous results. I see no reason for additional imaging or lab work at this time. Zofran improved the patient's nausea. He was able to tolerate PO liquids. There is no indication for admission tonight. I believe the patient may discharge home with a Zofran prescription and outpatient PCP follow up. I also recommend RICE for the right knee with Tylenol and Ibuprofen. I've provided information for the on-call orthopedic provider. The patient is aware and agrees with this plan. He is concerned about not being able to get the Zofran prescription before morning due to transportation issues. The IV Zofran should continue to help for the next few hours. Discharge home    Final Clinical Impression(s) / ED Diagnoses Final diagnoses:  Nausea and vomiting, unspecified vomiting type    Rx / DC Orders ED Discharge Orders          Ordered    ondansetron (ZOFRAN) 4 MG tablet  Every 8 hours PRN        12/03/21 2334              Ronny Bacon 12/03/21 2335    Varney Biles, MD 12/04/21 1623

## 2021-12-03 NOTE — Discharge Instructions (Addendum)
You were seen today for nausea and vomiting. Your lab work was reassuring that you do not have pancreatitis at this time.  Urinalysis shows no signs of an infection. I believe you can discharge home with a prescription for Zofran. I recommend following up with your PCP as needed. You also reported pain in the right knee and shoulder from a fall. X-rays showed no obvious fracture. There was an effusion of the right knee. I have provided information for the on call orthopedic provider. You may call their office for a follow up appointment if you continue to have knee pain. I recommend rest, ice, compression, and elevation. You may take Tylenold and ibuprofen for pain. Return to the emergency department if you develop life threatening conditions such as chest pain, shortness of breath, or altered level of consciousness ?

## 2021-12-17 ENCOUNTER — Other Ambulatory Visit: Payer: Self-pay

## 2021-12-23 ENCOUNTER — Other Ambulatory Visit: Payer: Self-pay

## 2021-12-23 ENCOUNTER — Other Ambulatory Visit: Payer: Self-pay | Admitting: Student

## 2021-12-27 ENCOUNTER — Other Ambulatory Visit: Payer: Self-pay

## 2022-01-10 ENCOUNTER — Encounter (HOSPITAL_COMMUNITY): Payer: Self-pay | Admitting: Pharmacy Technician

## 2022-01-10 ENCOUNTER — Other Ambulatory Visit: Payer: Self-pay

## 2022-01-10 ENCOUNTER — Emergency Department (HOSPITAL_COMMUNITY): Payer: Medicaid Other

## 2022-01-10 ENCOUNTER — Emergency Department (HOSPITAL_COMMUNITY)
Admission: EM | Admit: 2022-01-10 | Discharge: 2022-01-10 | Disposition: A | Discharge status: Home / Self Care | Payer: Medicaid Other | Attending: Emergency Medicine | Admitting: Emergency Medicine

## 2022-01-10 DIAGNOSIS — R197 Diarrhea, unspecified: Secondary | ICD-10-CM | POA: Diagnosis not present

## 2022-01-10 DIAGNOSIS — R1084 Generalized abdominal pain: Secondary | ICD-10-CM | POA: Diagnosis not present

## 2022-01-10 DIAGNOSIS — R112 Nausea with vomiting, unspecified: Secondary | ICD-10-CM | POA: Diagnosis not present

## 2022-01-10 DIAGNOSIS — G8929 Other chronic pain: Secondary | ICD-10-CM | POA: Insufficient documentation

## 2022-01-10 DIAGNOSIS — F331 Major depressive disorder, recurrent, moderate: Secondary | ICD-10-CM | POA: Insufficient documentation

## 2022-01-10 DIAGNOSIS — R45851 Suicidal ideations: Secondary | ICD-10-CM | POA: Diagnosis not present

## 2022-01-10 LAB — COMPREHENSIVE METABOLIC PANEL
ALT: 72 U/L — ABNORMAL HIGH (ref 0–44)
AST: 73 U/L — ABNORMAL HIGH (ref 15–41)
Albumin: 3.3 g/dL — ABNORMAL LOW (ref 3.5–5.0)
Alkaline Phosphatase: 167 U/L — ABNORMAL HIGH (ref 38–126)
Anion gap: 9 (ref 5–15)
BUN: 6 mg/dL (ref 6–20)
CO2: 24 mmol/L (ref 22–32)
Calcium: 9.1 mg/dL (ref 8.9–10.3)
Chloride: 103 mmol/L (ref 98–111)
Creatinine, Ser: 0.7 mg/dL (ref 0.61–1.24)
GFR, Estimated: >60 mL/min (ref >60)
Glucose, Bld: 111 mg/dL — ABNORMAL HIGH (ref 70–99)
Potassium: 4.2 mmol/L (ref 3.5–5.1)
Sodium: 136 mmol/L (ref 135–145)
Total Bilirubin: 0.8 mg/dL (ref 0.3–1.2)
Total Protein: 6.8 g/dL (ref 6.5–8.1)

## 2022-01-10 LAB — CBC
HCT: 38.5 % — ABNORMAL LOW (ref 39.0–52.0)
Hemoglobin: 12.3 g/dL — ABNORMAL LOW (ref 13.0–17.0)
MCH: 28.3 pg (ref 26.0–34.0)
MCHC: 31.9 g/dL (ref 30.0–36.0)
MCV: 88.7 fL (ref 80.0–100.0)
Platelets: 169 10*3/uL (ref 150–400)
RBC: 4.34 MIL/uL (ref 4.22–5.81)
RDW: 15.5 % (ref 11.5–15.5)
WBC: 8.6 10*3/uL (ref 4.0–10.5)
nRBC: 0 % (ref 0.0–0.2)

## 2022-01-10 LAB — URINALYSIS, ROUTINE W REFLEX MICROSCOPIC
Bilirubin Urine: NEGATIVE
Glucose, UA: NEGATIVE mg/dL
Hgb urine dipstick: NEGATIVE
Ketones, ur: NEGATIVE mg/dL
Leukocytes,Ua: NEGATIVE
Nitrite: NEGATIVE
Protein, ur: NEGATIVE mg/dL
Specific Gravity, Urine: 1.014 (ref 1.005–1.030)
pH: 5 (ref 5.0–8.0)

## 2022-01-10 LAB — PROTIME-INR
INR: 1 (ref 0.8–1.2)
Prothrombin Time: 12.9 s (ref 11.4–15.2)

## 2022-01-10 LAB — LIPASE, BLOOD: Lipase: 22 U/L (ref 11–51)

## 2022-01-10 MED ORDER — PANTOPRAZOLE SODIUM 40 MG PO TBEC
40.0000 mg | DELAYED_RELEASE_TABLET | Freq: Two times a day (BID) | ORAL | 0 refills | Status: AC
  Filled 2022-01-10: qty 60, 30d supply, fill #0

## 2022-01-10 MED ORDER — OXYCODONE HCL 5 MG PO TABS
5.0000 mg | ORAL_TABLET | Freq: Once | ORAL | Status: AC
  Administered 2022-01-10: 5 mg via ORAL
  Filled 2022-01-10: qty 1

## 2022-01-10 MED ORDER — PANTOPRAZOLE SODIUM 40 MG PO TBEC
40.0000 mg | DELAYED_RELEASE_TABLET | Freq: Two times a day (BID) | ORAL | Status: DC
  Administered 2022-01-10: 40 mg via ORAL
  Filled 2022-01-10: qty 1

## 2022-01-10 MED ORDER — PROMETHAZINE HCL 25 MG PO TABS: 25.0000 mg | ORAL_TABLET | Freq: Four times a day (QID) | ORAL | Status: DC | PRN

## 2022-01-10 MED ORDER — ONDANSETRON HCL 4 MG/2ML IJ SOLN
4.0000 mg | Freq: Once | INTRAMUSCULAR | Status: AC
  Administered 2022-01-10: 4 mg via INTRAVENOUS
  Filled 2022-01-10: qty 2

## 2022-01-10 MED ORDER — PANCRELIPASE (LIP-PROT-AMYL) 36000-114000 UNITS PO CPEP
36000.0000 [IU] | ORAL_CAPSULE | Freq: Three times a day (TID) | ORAL | Status: DC
  Filled 2022-01-10: qty 1

## 2022-01-10 MED ORDER — PANCRELIPASE (LIP-PROT-AMYL) 36000-114000 UNITS PO CPEP
36000.0000 [IU] | ORAL_CAPSULE | Freq: Three times a day (TID) | ORAL | 0 refills | Status: DC
  Filled 2022-01-10: qty 180, 60d supply, fill #0

## 2022-01-10 MED ORDER — SODIUM CHLORIDE 0.9 % IV BOLUS
1000.0000 mL | Freq: Once | INTRAVENOUS | Status: AC
  Administered 2022-01-10: 1000 mL via INTRAVENOUS

## 2022-01-10 MED ORDER — PROMETHAZINE HCL 25 MG PO TABS
25.0000 mg | ORAL_TABLET | Freq: Four times a day (QID) | ORAL | 0 refills | Status: DC | PRN
  Filled 2022-01-10: qty 30, 8d supply, fill #0

## 2022-01-10 MED ORDER — FENTANYL CITRATE PF 50 MCG/ML IJ SOSY
50.0000 ug | PREFILLED_SYRINGE | Freq: Once | INTRAMUSCULAR | Status: AC
  Administered 2022-01-10: 50 ug via INTRAVENOUS
  Filled 2022-01-10: qty 1

## 2022-01-10 NOTE — ED Triage Notes (Signed)
Pt here with fall a few days ago, now having L shouder and R knee pain. Pt also complains of abdominal pain with NVD. Hx pancreatitis.  ?

## 2022-01-10 NOTE — BH Assessment (Addendum)
Comprehensive Clinical Assessment (CCA) Screening, Triage and Referral Note ? ?01/10/2022 ?Brent Rogers ?[id number] ?Disposition:  Clinician discussed patient care with Quintella Reichert, NP.  She recommends psych clearance of patient.  He does not meet inpatient care criteria.  Pt would benefit from outpatient referrals.  Clinician informed Dr. Ronnald Nian and RN Quita Skye of disposition recommendation via secure messaging.   ? ?Patient has fair eye contact and is oriented x4.  He says that he hopes he stays overnight because he is nervous about going back home feeling as physically bad as he does.  Pt denies current SI, HI or A/V hallucinations.  He is not responding to any internal stimuli.  Pt does not evidence any delusional thought process.  Pt reports poor sleep and appetite.  He has had major pancreatic surgery. ? ?Pt has no current outpatient provider for therapy.  He hs no psychiatrist.  He is interested in outpatient therapy. ? ? ?Chief Complaint:  ?Chief Complaint  ?Patient presents with  ? Abdominal Pain  ? Fall  ? Suicidal  ? ?Visit Diagnosis: MDD recurrent, moderate ? ?Patient Reported Information ?How did you hear about [place]? Self ? ?What Is the Reason for Your Visit/Call Today? Pt came to the hospital because he was having bad headaches, NVD and body aches.  Pt said that his head was hurting so much he had thought about "getting a knife and cutting my head off."  Pt says he had some SI earlier in the week.  His suicidal thoughts stem more from his headaches and not being able to eat and keep food down.  Pt reports no previous suicide attempts.  He has had similar feelings before.  No good sleep, has been getting less than 4 hours of sleep a night.  Pt denies any A/V hallucinations.  He says he has very vivid dreams about being chased and not being able to get away.  Pt denies any use of ETOH or other drugs.  Pt denies any access to guns.  He has been living by himself for the last 1.5 years and he has no  relatives close by.  Pt has no outpatient mental health care.  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 ? ?How Long Has This Been Causing You Problems? > than 6 months ? ?What Do You Feel Would Help You the Most Today? Treatment for Depression or other mood problem ? ? ?Have You Recently Had Any Thoughts About Hurting Yourself? Yes ? ?Are You Planning to Commit Suicide/Harm Yourself At This time? No ? ? ?Have you Recently Had Thoughts About [place]? No ? ?Are You Planning to Harm Someone at This Time? No ? ?Explanation: No data recorded ? ?Have You Used Any Alcohol or Drugs in the Past 24 Hours? No ? ?How Long Ago Did You Use Drugs or Alcohol? No data recorded ?What Did You Use and How Much? No data recorded ? ?Do You Currently Have a Therapist/Psychiatrist? No ? ?Name of Therapist/Psychiatrist: No data recorded ? ?Have You Been Recently Discharged From Any Office Practice or Programs? No ? ?Explanation of Discharge From Practice/Program: No data recorded ?  ?CCA Screening Triage Referral Assessment ?Type of Contact: Tele-Assessment ? ?Telemedicine Service Delivery:   ?Is this Initial or Reassessment? Initial Assessment ? ?Date Telepsych consult ordered in [facility]:  01/10/22 ? ?Time Telepsych consult ordered in [facility]:  2059 ? ?Location of Assessment: [facility] ? ?Provider Location: [facility] Assessment Services ? ? ?Collateral Involvement: None ? ? ?  Does Patient Have a Stage manager Guardian? No data recorded ?Name and Contact of Legal Guardian: No data recorded ?If Minor and Not Living with Parent(s), Who has Custody? No data recorded ?Is CPS involved or ever been involved? Never ? ?Is APS involved or ever been involved? Never ? ? ?Patient Determined To Be At Risk for Harm To Self or Others Based on Review of Patient Reported Information or Presenting Complaint? No ? ?Method: No data recorded ?Availability of Means: No data recorded ?Intent: No data recorded ?Notification Required: No data  recorded ?Additional Information for Danger to Others Potential: No data recorded ?Additional Comments for Danger to Others Potential: No data recorded ?Are There Guns or Other Weapons in [place]? No data recorded ?Types of Guns/Weapons: No data recorded ?Are These Weapons Safely Secured?                            No data recorded ?Who Could Verify You Are Able To Have These Secured: No data recorded ?Do You Have any Outstanding Charges, Pending Court Dates, Parole/Probation? No data recorded ?Contacted To Inform of Risk of Harm To Self or Others: No data recorded ? ?Does Patient Present under Involuntary Commitment? No ? ?IVC Papers Initial File Date: No data recorded ? ?[place] of Residence: [place] Arg[place] ? ? ?Patient Currently Receiving the Following Services: Not Receiving Services ? ? ?Determination of Need: Routine (7 days) ? ? ?Options For Referral: Outpatient Therapy ? ? ?Discharge Disposition:  ?  ? ?Curlene Dolphin Ray, LCAS ? ? ?  ?  ?  ? ? ?

## 2022-01-10 NOTE — ED Triage Notes (Signed)
Pt is expressing SI states because he is feeling ill. Pt states his plan was to "cut  his head off." Pt expressing the need to talk to someone.  ?

## 2022-01-10 NOTE — ED Provider Triage Note (Signed)
Emergency Medicine Provider Triage Evaluation Note ? ?Brent Rogers , a 56 y.o. male  was evaluated in triage.  Pt multiple complaints.  He is complaining of low left knee pain and right shoulder pain after fall on Sunday.  Patient has a history of chronic pancreatitis, and is having some abdominal pain as well as nausea, vomiting, and diarrhea.  States he is tolerating liquids, but is having up to 10 episodes of diarrhea per day that is "jet black".  He also says his headache is so bad it makes him want to take the top of his head off. ? ?Review of Systems  ?Positive: Headache, abdominal pain, nausea, vomiting, diarrhea, fall ?Negative: Syncope, chest pain, shortness of breath ? ?Physical Exam  ?BP 113/89   Pulse 95   Temp 98.7 ?F (37.1 ?C)   Resp 18   SpO2 100%  ?Gen:   Awake, no distress   ?Resp:  Normal effort  ?MSK:   Moves extremities without difficulty  ?Other:   ? ?Medical Decision Making  ?Medically screening exam initiated at 3:35 PM.  Appropriate orders placed.  Brent Rogers was informed that the remainder of the evaluation will be completed by another provider, this initial triage assessment does not replace that evaluation, and the importance of remaining in the ED until their evaluation is complete. ? ? ?  ?Kateri Plummer, PA-C ?01/10/22 1535 ? ?

## 2022-01-10 NOTE — ED Notes (Signed)
Pt teaching provided on medications that may cause drowsiness. Pt instructed not to drive or operate heavy machinery while taking the prescribed medication. Pt verbalized understanding.  ? ?Pt provided discharge instructions and prescription information. Pt was given the opportunity to ask questions and questions were answered. Discharge signature not obtained in the setting of the COVID-19 pandemic in order to reduce high touch surfaces.  ? ?

## 2022-01-10 NOTE — ED Notes (Signed)
Pt given water and crackers for PO challenge

## 2022-01-10 NOTE — ED Notes (Signed)
Pt c/o pain at IV site. Noted infiltration to the area. IV discontinued.  ?

## 2022-01-10 NOTE — ED Notes (Signed)
Pt moved to ED room 4 at this time.  ?

## 2022-01-10 NOTE — ED Provider Notes (Addendum)
?[facility] ?Provider Note ? ? ?CSN: [id number] ?Arrival date & time: 01/10/22  1447 ? ?  ? ?History ? ?Chief Complaint  ?Patient presents with  ? Abdominal Pain  ? Fall  ? Suicidal  ? ? ?LAWERANCE MATSUO is a 56 y.o. male. ? ?The history is provided by the patient.  ?Abdominal Pain ?Pain location:  Generalized ?Pain quality: aching   ?Pain radiates to:  Does not radiate ?Pain severity:  Mild ?Onset quality:  Gradual ?Timing:  Constant ?Progression:  Unchanged ?Chronicity:  Recurrent ?Relieved by:  Nothing ?Worsened by:  Nothing ?Associated symptoms: diarrhea, nausea and vomiting   ?Associated symptoms: no anorexia, no belching, no chest pain, no chills, no constipation, no cough, no dysuria, no fatigue, no fever, no flatus and no shortness of breath   ?Risk factors comment:  Chronic pancreatitis ?Fall ?Associated symptoms include abdominal pain. Pertinent negatives include no chest pain and no shortness of breath.  ? ?  ? ?Home Medications ?Prior to Admission medications   ?Medication Sig Start Date End Date Taking? Authorizing Provider  ?lipase/protease/amylase (CREON) 36000 UNITS CPEP capsule Take 1 capsule (36,000 Units total) by mouth with breakfast, with lunch, and with evening meal. 09/27/21   Delene Ruffini, MD  ?ondansetron (ZOFRAN) 4 MG tablet Take 1 tablet (4 mg total) by mouth every 8 (eight) hours as needed for nausea or vomiting. 12/03/21   Dorothyann Peng, PA-C  ?pantoprazole (PROTONIX) 40 MG tablet Take 1 tablet (40 mg total) by mouth 2 (two) times daily. 10/14/21 11/13/21  Gaylan Gerold, DO  ?promethazine (PHENERGAN) 25 MG tablet Take 1 tablet (25 mg total) by mouth every 6 (six) hours as needed for nausea or vomiting. 10/14/21   Gaylan Gerold, DO  ?ramelteon (ROZEREM) 8 MG tablet Take 1 tablet (8 mg total) by mouth at bedtime. 10/14/21 11/13/21  Gaylan Gerold, DO  ?   ? ?Allergies    ?Patient has no known allergies.   ? ?Review of Systems   ?Review of Systems   ?Constitutional:  Negative for chills, fatigue and fever.  ?Respiratory:  Negative for cough and shortness of breath.   ?Cardiovascular:  Negative for chest pain.  ?Gastrointestinal:  Positive for abdominal pain, diarrhea, nausea and vomiting. Negative for anorexia, constipation and flatus.  ?Genitourinary:  Negative for dysuria.  ? ?Physical Exam ?Updated Vital Signs ?BP (!) 135/99   Pulse 77   Temp 98.7 ?F (37.1 ?C)   Resp 20   SpO2 99%  ?Physical Exam ?Vitals and nursing note reviewed.  ?Constitutional:   ?   General: He is not in acute distress. ?   Appearance: He is well-developed.  ?HENT:  ?   Head: Normocephalic and atraumatic.  ?Eyes:  ?   Conjunctiva/sclera: Conjunctivae normal.  ?Cardiovascular:  ?   Rate and Rhythm: Normal rate and regular rhythm.  ?   Heart sounds: Normal heart sounds. No murmur heard. ?Pulmonary:  ?   Effort: Pulmonary effort is normal. No respiratory distress.  ?   Breath sounds: Normal breath sounds.  ?Abdominal:  ?   Palpations: Abdomen is soft.  ?   Tenderness: There is abdominal tenderness.  ?Musculoskeletal:     ?   General: No swelling.  ?   Cervical back: Neck supple.  ?Skin: ?   General: Skin is warm and dry.  ?   Capillary Refill: Capillary refill takes less than 2 seconds.  ?Neurological:  ?   General: No focal deficit present.  ?  Mental Status: He is alert.  ?Psychiatric:     ?   Mood and Affect: Mood normal.  ? ? ?ED Results / Procedures / Treatments   ?Labs ?(all labs ordered are listed, but only abnormal results are displayed) ?Labs Reviewed  ?COMPREHENSIVE METABOLIC PANEL - Abnormal; Notable for the following components:  ?    Result Value  ? Glucose, Bld 111 (*)   ? Albumin 3.3 (*)   ? AST 73 (*)   ? ALT 72 (*)   ? Alkaline Phosphatase 167 (*)   ? All other components within normal limits  ?CBC - Abnormal; Notable for the following components:  ? Hemoglobin 12.3 (*)   ? HCT 38.5 (*)   ? All other components within normal limits  ?LIPASE, BLOOD  ?URINALYSIS,  ROUTINE W REFLEX MICROSCOPIC  ?PROTIME-INR  ? ? ?EKG ?None ? ?Radiology ?CT ABDOMEN PELVIS WO CONTRAST ? ?Result Date: 01/10/2022 ?CLINICAL DATA:  Fall, abdominal pain. EXAM: CT ABDOMEN AND PELVIS WITHOUT CONTRAST TECHNIQUE: Multidetector CT imaging of the abdomen and pelvis was performed following the standard protocol without IV contrast. RADIATION DOSE REDUCTION: This exam was performed according to the departmental dose-optimization program which includes automated exposure control, adjustment of the mA and/or kV according to patient size and/or use of iterative reconstruction technique. COMPARISON:  09/25/2021. FINDINGS: Lower chest: No acute abnormality. Hepatobiliary: No focal liver abnormality is seen. Status post cholecystectomy. No biliary dilatation. Pancreas: Status post Whipple procedure. No pancreatic ductal dilatation or surrounding inflammatory changes. Spleen: Normal in size without focal abnormality. Adrenals/Urinary Tract: Adrenal glands are unremarkable. Kidneys are normal, without renal calculi, focal lesion, or hydronephrosis. Bladder is unremarkable. Stomach/Bowel: Status post gastrectomy with gastrojejunostomy. There is a stable linear radiopaque structure in the stomach, unchanged from prior exams. No bowel obstruction, free air, or pneumatosis. There is colonic diverticulosis without diverticulitis. Vascular/Lymphatic: Aortic atherosclerosis. No enlarged abdominal or pelvic lymph nodes. Reproductive: Prostate is unremarkable. Other: No abdominal wall hernia or abnormality. No abdominopelvic ascites. Musculoskeletal: Degenerative changes are present in the thoracolumbar spine. No acute osseous abnormality. IMPRESSION: 1. No acute intra-abdominal process. 2. Status post Whipple procedure and partial gastrectomy. 3. Aortic atherosclerosis. Electronically Signed   By: Brett Fairy M.D.   On: 01/10/2022 21:50  ? ?DG Shoulder Left ? ?Result Date: 01/10/2022 ?CLINICAL DATA:  Status post fall. EXAM:  LEFT SHOULDER - 2+ VIEW COMPARISON:  None. FINDINGS: There is no evidence of an acute fracture or dislocation. An ill-defined, chronic appearing deformity is seen involving the scapula along the region inferior to the left glenoid. Marked severity degenerative changes seen involving the left acromioclavicular joint. Soft tissues are unremarkable. IMPRESSION: Chronic and degenerative changes, without an acute osseous abnormality. Electronically Signed   By: Virgina Norfolk M.D.   On: 01/10/2022 20:00  ? ?DG Knee Complete 4 Views Right ? ?Result Date: 01/10/2022 ?CLINICAL DATA:  Right knee pain. EXAM: RIGHT KNEE - COMPLETE 4+ VIEW COMPARISON:  None. FINDINGS: No evidence of an acute fracture, dislocation, or joint effusion. A small area of cortical exostosis is seen along the posterolateral aspect of the distal right femoral shaft. Mild to moderate severity medial and lateral tibiofemoral compartment space narrowing is seen. Moderate severity patellofemoral narrowing is also noted. Soft tissues are unremarkable. IMPRESSION: Mild to moderate severity degenerative changes. Electronically Signed   By: Virgina Norfolk M.D.   On: 01/10/2022 20:06   ? ?Procedures ?Procedures  ? ? ?Medications Ordered in ED ?Medications  ?lipase/protease/amylase (CREON) capsule 36,000 Units (has  no administration in time range)  ?pantoprazole (PROTONIX) EC tablet 40 mg (has no administration in time range)  ?promethazine (PHENERGAN) tablet 25 mg (has no administration in time range)  ?sodium chloride 0.9 % bolus 1,000 mL (1,000 mLs Intravenous New Bag/Given 01/10/22 2007)  ?ondansetron [facility]) injection 4 mg (4 mg Intravenous Given 01/10/22 2007)  ?fentaNYL (SUBLIMAZE) injection 50 mcg (50 mcg Intravenous Given 01/10/22 2007)  ? ? ?ED Course/ Medical Decision Making/ A&P ?  ?                        ?Medical Decision Making ?Amount and/or Complexity of Data Reviewed ?Radiology: ordered. ? ?Risk ?Prescription drug management. ? ? ?GEORDIE NOONEY is here with nausea, vomiting, diarrhea.  History of chronic pancreatitis.  Patient with unremarkable vitals.  No fever.  Symptoms for the last day or 2.  States he is having a lot of depression due to his ch

## 2022-01-13 ENCOUNTER — Other Ambulatory Visit: Payer: Self-pay

## 2022-01-14 ENCOUNTER — Other Ambulatory Visit: Payer: Self-pay

## 2022-01-20 ENCOUNTER — Other Ambulatory Visit: Payer: Self-pay

## 2022-02-02 ENCOUNTER — Emergency Department (HOSPITAL_COMMUNITY)
Admission: EM | Admit: 2022-02-02 | Discharge: 2022-02-03 | Disposition: A | Discharge status: Home / Self Care | Payer: Medicaid Other | Attending: Emergency Medicine | Admitting: Emergency Medicine

## 2022-02-02 ENCOUNTER — Encounter (HOSPITAL_COMMUNITY): Payer: Self-pay

## 2022-02-02 ENCOUNTER — Other Ambulatory Visit: Payer: Self-pay

## 2022-02-02 DIAGNOSIS — R197 Diarrhea, unspecified: Secondary | ICD-10-CM | POA: Diagnosis not present

## 2022-02-02 DIAGNOSIS — R519 Headache, unspecified: Secondary | ICD-10-CM | POA: Diagnosis present

## 2022-02-02 DIAGNOSIS — H53149 Visual discomfort, unspecified: Secondary | ICD-10-CM | POA: Diagnosis not present

## 2022-02-02 DIAGNOSIS — I10 Essential (primary) hypertension: Secondary | ICD-10-CM | POA: Insufficient documentation

## 2022-02-02 DIAGNOSIS — R112 Nausea with vomiting, unspecified: Secondary | ICD-10-CM | POA: Insufficient documentation

## 2022-02-02 DIAGNOSIS — F17210 Nicotine dependence, cigarettes, uncomplicated: Secondary | ICD-10-CM | POA: Diagnosis not present

## 2022-02-02 NOTE — ED Triage Notes (Signed)
Patient arrived with complaints of bright red blood in his stool over the last week and NV. Taking tylenol for abdominal pain with no relief.  ?

## 2022-02-03 ENCOUNTER — Emergency Department (HOSPITAL_COMMUNITY): Payer: Medicaid Other

## 2022-02-03 ENCOUNTER — Other Ambulatory Visit: Payer: Self-pay

## 2022-02-03 LAB — COMPREHENSIVE METABOLIC PANEL
ALT: 79 U/L — ABNORMAL HIGH (ref 0–44)
AST: 155 U/L — ABNORMAL HIGH (ref 15–41)
Albumin: 2.9 g/dL — ABNORMAL LOW (ref 3.5–5.0)
Alkaline Phosphatase: 136 U/L — ABNORMAL HIGH (ref 38–126)
Anion gap: 5 (ref 5–15)
BUN: <5 mg/dL — ABNORMAL LOW (ref 6–20)
CO2: 27 mmol/L (ref 22–32)
Calcium: 8.2 mg/dL — ABNORMAL LOW (ref 8.9–10.3)
Chloride: 111 mmol/L (ref 98–111)
Creatinine, Ser: 0.58 mg/dL — ABNORMAL LOW (ref 0.61–1.24)
GFR, Estimated: >60 mL/min (ref >60)
Glucose, Bld: 130 mg/dL — ABNORMAL HIGH (ref 70–99)
Potassium: 3.6 mmol/L (ref 3.5–5.1)
Sodium: 143 mmol/L (ref 135–145)
Total Bilirubin: 0.7 mg/dL (ref 0.3–1.2)
Total Protein: 6 g/dL — ABNORMAL LOW (ref 6.5–8.1)

## 2022-02-03 LAB — CBC
HCT: 34.4 % — ABNORMAL LOW (ref 39.0–52.0)
Hemoglobin: 11.5 g/dL — ABNORMAL LOW (ref 13.0–17.0)
MCH: 29.3 pg (ref 26.0–34.0)
MCHC: 33.4 g/dL (ref 30.0–36.0)
MCV: 87.5 fL (ref 80.0–100.0)
Platelets: 207 10*3/uL (ref 150–400)
RBC: 3.93 MIL/uL — ABNORMAL LOW (ref 4.22–5.81)
RDW: 15.9 % — ABNORMAL HIGH (ref 11.5–15.5)
WBC: 6.6 10*3/uL (ref 4.0–10.5)
nRBC: 0 % (ref 0.0–0.2)

## 2022-02-03 LAB — DIFFERENTIAL
Abs Immature Granulocytes: 0.06 10*3/uL (ref 0.00–0.07)
Basophils Absolute: 0.1 10*3/uL (ref 0.0–0.1)
Basophils Relative: 1 %
Eosinophils Absolute: 0.1 10*3/uL (ref 0.0–0.5)
Eosinophils Relative: 1 %
Immature Granulocytes: 1 %
Lymphocytes Relative: 41 %
Lymphs Abs: 2.7 10*3/uL (ref 0.7–4.0)
Monocytes Absolute: 0.4 10*3/uL (ref 0.1–1.0)
Monocytes Relative: 7 %
Neutro Abs: 3.2 10*3/uL (ref 1.7–7.7)
Neutrophils Relative %: 49 %

## 2022-02-03 LAB — TYPE AND SCREEN
ABO/RH(D): O POS
Antibody Screen: NEGATIVE

## 2022-02-03 LAB — ACETAMINOPHEN LEVEL: Acetaminophen (Tylenol), Serum: <10 ug/mL — ABNORMAL LOW (ref 10–30)

## 2022-02-03 LAB — LIPASE, BLOOD: Lipase: 21 U/L (ref 11–51)

## 2022-02-03 LAB — POC OCCULT BLOOD, ED: Fecal Occult Bld: NEGATIVE

## 2022-02-03 MED ORDER — SODIUM CHLORIDE 0.9 % IV BOLUS
1000.0000 mL | Freq: Once | INTRAVENOUS | Status: AC
  Administered 2022-02-03: 1000 mL via INTRAVENOUS

## 2022-02-03 MED ORDER — KETOROLAC TROMETHAMINE 15 MG/ML IJ SOLN
15.0000 mg | Freq: Once | INTRAMUSCULAR | Status: AC
  Administered 2022-02-03: 15 mg via INTRAVENOUS
  Filled 2022-02-03: qty 1

## 2022-02-03 MED ORDER — METOCLOPRAMIDE HCL 5 MG/ML IJ SOLN
10.0000 mg | Freq: Once | INTRAMUSCULAR | Status: AC
  Administered 2022-02-03: 10 mg via INTRAVENOUS
  Filled 2022-02-03: qty 2

## 2022-02-03 MED ORDER — METOCLOPRAMIDE HCL 10 MG PO TABS
10.0000 mg | ORAL_TABLET | Freq: Four times a day (QID) | ORAL | 0 refills | Status: DC | PRN
Start: 2022-02-03 — End: 2023-05-22
  Filled 2022-02-03: qty 12, 3d supply, fill #0

## 2022-02-03 NOTE — ED Notes (Signed)
Attempted blood draw upon patient arrival. Unsuccessful. Pt advised that he is dehydrated and normally needs an ultrasound. ?

## 2022-02-03 NOTE — ED Notes (Signed)
During orthostatic vs, the pt felt slight dizziness during the standing part of the vs.  ?

## 2022-02-03 NOTE — ED Provider Notes (Signed)
? ?[place] DEPT ?Provider Note: Georgena Spurling, MD, [place] ? ?CSN: [id number] ?MRN: [id number] ?ARRIVAL: 02/02/22 at 2325 ?ROOM: WA10/WA10 ? ? ?CHIEF COMPLAINT  ?Headache ? ? ?HISTORY OF PRESENT ILLNESS  ?02/03/22 1:01 AM ?Brent Rogers is a 56 y.o. male with chronic abdominal pain due to chronic pancreatitis status post Whipple procedure.  He is here with 8 days of generalized headache with associated photophobia, nausea and vomiting.  He has been taking Tylenol for the headache without adequate relief.  He does not usually get headaches.  He states he has taken a bottle of 50 Tylenol tablets in the past week.  He has chronic diarrhea associated with his pancreatitis so this is not a change for him.  He has also noticed some blood streaking in his stools but his stools have been brown. ? ? ?Past Medical History:  ?Diagnosis Date  ? Benign tumor of endocrine pancreas   ? Chronic pancreatitis ([place])   ? S/P Whipple  ? Foot ulcer with fat layer exposed ([place])   ? Hypertension   ? Migraine   ? "@ least once/month" (11/12/2015)  ? Pancreatic abnormality   ? CT  shows mass  ? Pneumonia 11/12/2015  ? Scoliosis   ? ? ?Past Surgical History:  ?Procedure Laterality Date  ? BACK SURGERY    ? BIOPSY  09/26/2021  ? Procedure: BIOPSY;  Surgeon: Ladene Artist, MD;  Location: [facility];  Service: Endoscopy;;  ? COLONOSCOPY WITH PROPOFOL N/A 03/11/2021  ? Procedure: COLONOSCOPY WITH PROPOFOL;  Surgeon: Carol Ada, MD;  Location: [facility];  Service: Endoscopy;  Laterality: N/A;  ? ESOPHAGOGASTRODUODENOSCOPY (EGD) WITH PROPOFOL N/A 03/11/2021  ? Procedure: ESOPHAGOGASTRODUODENOSCOPY (EGD) WITH PROPOFOL;  Surgeon: Carol Ada, MD;  Location: [facility];  Service: Endoscopy;  Laterality: N/A;  ? ESOPHAGOGASTRODUODENOSCOPY (EGD) WITH PROPOFOL N/A 09/26/2021  ? Procedure: ESOPHAGOGASTRODUODENOSCOPY (EGD) WITH PROPOFOL;  Surgeon: Ladene Artist, MD;  Location: [facility] ENDOSCOPY;  Service: Endoscopy;  Laterality: N/A;  ? EUS  N/A 09/21/2013  ? Procedure: ESOPHAGEAL ENDOSCOPIC ULTRASOUND (EUS) RADIAL;  Surgeon: Beryle Beams, MD;  Location: [facility] ENDOSCOPY;  Service: Endoscopy;  Laterality: N/A;  ? EUS N/A 09/30/2013  ? Procedure: UPPER ENDOSCOPIC ULTRASOUND (EUS) LINEAR;  Surgeon: Beryle Beams, MD;  Location: [facility] ENDOSCOPY;  Service: Endoscopy;  Laterality: N/A;  ? FINE NEEDLE ASPIRATION N/A 09/21/2013  ? Procedure: FINE NEEDLE ASPIRATION (FNA) LINEAR;  Surgeon: Beryle Beams, MD;  Location: [facility] ENDOSCOPY;  Service: Endoscopy;  Laterality: N/A;  ? FINGER FRACTURE SURGERY Left 1995  ? 5th digit  ? FRACTURE SURGERY    ? HEMOSTASIS CLIP PLACEMENT  03/11/2021  ? Procedure: HEMOSTASIS CLIP PLACEMENT;  Surgeon: Carol Ada, MD;  Location: [facility];  Service: Endoscopy;;  ? HEMOSTASIS CONTROL  03/11/2021  ? Procedure: HEMOSTASIS CONTROL;  Surgeon: Carol Ada, MD;  Location[facility] END[facility]COPY;  Service: Endoscopy;;  ? KNEE ARTHROSCOPY Bilateral 1987-1989  ? right-left  ? LAPAROSCOPY N/A 12/01/2013  ? Procedure: LAPAROSCOPY DIAGNOSTIC PANCREATICODUODENECTOMY WITH BILIARY AND PANCREATIC STENTS;  Surgeon: Stark Klein, MD;  Location: [facility];  Service: General;  Laterality: N/A;  ? LUMBAR [place] SURGERY  1990's  ? SAVORY DILATION N/A 03/11/2021  ? Procedure: SAVORY DILATION;  Surgeon: Carol Ada, MD;  Location: [facility];  Service: Endoscopy;  Laterality: N/A;  ? WHIPPLE PROCEDURE  12/01/2013  ? ? ?Family History  ?Problem Relation Age of Onset  ? Cancer Mother   ?     unsure  ? COPD Mother   ?  Hypertension Mother   ? Hypertension Father   ? COPD Father   ? CAD Sister   ?     possible has stent per pt  ? ? ?Social History  ? ?Tobacco Use  ? Smoking status: Every Day  ?  Packs/day: 1.00  ?  Types: Cigarettes  ? Smokeless tobacco: Former  ?  Types: Snuff  ? Tobacco comments:  ?  "used snuff 10-15 years in my 20s-30s"  ?Vaping Use  ? Vaping Use: Never used  ?Substance Use Topics  ? Alcohol use: Yes  ?  Alcohol/week: 2.0 standard drinks  ?  Types: 2 Cans  of beer per week  ?  Comment: monthly  ? Drug use: No  ? ? ?Prior to Admission medications   ?Medication Sig Start Date End Date Taking? Authorizing Provider  ?metoCLOPramide (REGLAN) 10 MG tablet Take 1 tablet (10 mg total) by mouth every 6 (six) hours as needed for nausea or vomiting. 02/03/22  Yes Elena Cothern, MD  ?lipase/protease/amylase (CREON) 36000 UNITS CPEP capsule Take 1 capsule (36,000 Units total) by mouth with breakfast, with lunch, and with evening meal. 01/10/22   Curatolo, Adam, DO  ?ondansetron (ZOFRAN) 4 MG tablet Take 1 tablet (4 mg total) by mouth every 8 (eight) hours as needed for nausea or vomiting. 12/03/21   Dorothyann Peng, PA-C  ?pantoprazole (PROTONIX) 40 MG tablet Take 1 tablet (40 mg total) by mouth 2 (two) times daily. 01/10/22 02/09/22  Lennice Sites, DO  ?promethazine (PHENERGAN) 25 MG tablet Take 1 tablet (25 mg total) by mouth every 6 (six) hours as needed for nausea or vomiting. 01/10/22   Lennice Sites, DO  ? ? ?Allergies ?Patient has no known allergies. ? ? ?REVIEW OF SYSTEMS  ?Negative except as noted here or in the History of Present Illness. ? ? ?PHYSICAL EXAMINATION  ?Initial Vital Signs ?Blood pressure 123/88, pulse 60, temperature 98.3 ?F (36.8 ?C), temperature source Oral, resp. rate 16, SpO2 100 %. ? ?Examination ?General: Well-developed, thin male in no acute distress; appearance consistent with age of record ?HENT: normocephalic; atraumatic ?Eyes: pupils equal, round and reactive to light; extraocular muscles intact; photophobia ?Neck: supple ?Heart: regular rate and rhythm ?Lungs: clear to auscultation bilaterally ?Abdomen: soft; nondistended; left-sided tenderness; no masses or hepatosplenomegaly; bowel sounds present ?Rectal: Normal sphincter tone; no hemorrhoids or fissures seen; no formed stool in vault; no gross blood on examining glove, stool on examining glove light brown ?Extremities: No deformity; full range of motion; pulses normal ?Neurologic: Awake, alert and  oriented; motor function intact in all extremities and symmetric; no facial droop ?Skin: Warm and dry ?Psychiatric: Normal mood and affect ? ? ?RESULTS  ?Summary of this visit's results, reviewed and interpreted by myself: ? ? EKG Interpretation ? ?Date/Time:    ?Ventricular Rate:    ?PR Interval:    ?QRS Duration:   ?QT Interval:    ?QTC Calculation:   ?R Axis:     ?Text Interpretation:   ?  ? ?  ? ?Laboratory Studies: ?Results for orders placed or performed during the hospital encounter of 02/02/22 (from the past 24 hour(s))  ?Comprehensive metabolic panel     Status: Abnormal  ? Collection Time: 02/03/22 12:53 AM  ?Result Value Ref Range  ? Sodium 143 135 - 145 mmol/L  ? Potassium 3.6 3.5 - 5.1 mmol/L  ? Chloride 111 98 - 111 mmol/L  ? CO2 27 22 - 32 mmol/L  ? Glucose, Bld 130 (H) 70 - 99  mg/dL  ? BUN <5 (L) 6 - 20 mg/dL  ? Creatinine, Ser 0.58 (L) 0.61 - 1.24 mg/dL  ? Calcium 8.2 (L) 8.9 - 10.3 mg/dL  ? Total Protein 6.0 (L) 6.5 - 8.1 g/dL  ? Albumin 2.9 (L) 3.5 - 5.0 g/dL  ? AST 155 (H) 15 - 41 U/L  ? ALT 79 (H) 0 - 44 U/L  ? Alkaline Phosphatase 136 (H) 38 - 126 U/L  ? Total Bilirubin 0.7 0.3 - 1.2 mg/dL  ? GFR, Estimated >60 >60 mL/min  ? Anion gap 5 5 - 15  ?CBC     Status: Abnormal  ? Collection Time: 02/03/22 12:53 AM  ?Result Value Ref Range  ? WBC 6.6 4.0 - 10.5 K/uL  ? RBC 3.93 (L) 4.22 - 5.81 MIL/uL  ? Hemoglobin 11.5 (L) 13.0 - 17.0 g/dL  ? HCT 34.4 (L) 39.0 - 52.0 %  ? MCV 87.5 80.0 - 100.0 fL  ? MCH 29.3 26.0 - 34.0 pg  ? MCHC 33.4 30.0 - 36.0 g/dL  ? RDW 15.9 (H) 11.5 - 15.5 %  ? Platelets 207 150 - 400 K/uL  ? nRBC 0.0 0.0 - 0.2 %  ?Lipase, blood     Status: None  ? Collection Time: 02/03/22 12:53 AM  ?Result Value Ref Range  ? Lipase 21 11 - 51 U/L  ?Differential     Status: None  ? Collection Time: 02/03/22 12:53 AM  ?Result Value Ref Range  ? Neutrophils Relative % 49 %  ? Neutro Abs 3.2 1.7 - 7.7 K/uL  ? Lymphocytes Relative 41 %  ? Lymphs Abs 2.7 0.7 - 4.0 K/uL  ? Monocytes Relative 7 %  ?  Monocytes Absolute 0.4 0.1 - 1.0 K/uL  ? Eosinophils Relative 1 %  ? Eosinophils Absolute 0.1 0.0 - 0.5 K/uL  ? Basophils Relative 1 %  ? Basophils Absolute 0.1 0.0 - 0.1 K/uL  ? Immature Granulocytes 1

## 2022-02-10 ENCOUNTER — Other Ambulatory Visit: Payer: Self-pay

## 2022-02-12 ENCOUNTER — Ambulatory Visit (HOSPITAL_COMMUNITY)
Admission: EM | Admit: 2022-02-12 | Discharge: 2022-02-13 | Disposition: A | Discharge status: Home / Self Care | Payer: Medicaid Other | Attending: Psychiatry | Admitting: Psychiatry

## 2022-02-12 DIAGNOSIS — R45851 Suicidal ideations: Secondary | ICD-10-CM | POA: Insufficient documentation

## 2022-02-12 DIAGNOSIS — F411 Generalized anxiety disorder: Secondary | ICD-10-CM | POA: Insufficient documentation
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
## 2022-02-12 NOTE — ED Triage Notes (Signed)
Pt presents to [facility] voluntarily, accompanied by [employer] with complaints of suicidal ideation with no plan, headache and breathing problems. Pt reports that he was home tonight and called 911 to be brought to [facility] because he started feeling down and sad. Pt reports dealing with these thoughts for a few weeks now. Writer asked if anything in particular happenened this evening to trigger SI thoughts. Pt stated " no I was just thinking and I started to feel bad". Pt reports hx of depression.  Pt currently denies HI, AVH a this time. Pt is routine. ?

## 2022-02-13 NOTE — Discharge Instructions (Addendum)
Pt is advise to call 911 or crisis hotline,  he is advice he can return to [facility] for any reason if he chooses too.

## 2022-03-04 ENCOUNTER — Emergency Department (HOSPITAL_COMMUNITY)
Admission: EM | Admit: 2022-03-04 | Discharge: 2022-03-04 | Discharge status: Against Medical Advice | Payer: Medicaid Other | Attending: Emergency Medicine | Admitting: Emergency Medicine

## 2022-03-04 ENCOUNTER — Other Ambulatory Visit: Payer: Self-pay

## 2022-03-04 ENCOUNTER — Emergency Department (HOSPITAL_COMMUNITY): Payer: Medicaid Other

## 2022-03-04 ENCOUNTER — Encounter (HOSPITAL_COMMUNITY): Payer: Self-pay

## 2022-03-04 DIAGNOSIS — Y92009 Unspecified place in unspecified non-institutional (private) residence as the place of occurrence of the external cause: Secondary | ICD-10-CM | POA: Diagnosis not present

## 2022-03-04 DIAGNOSIS — W1830XA Fall on same level, unspecified, initial encounter: Secondary | ICD-10-CM | POA: Insufficient documentation

## 2022-03-04 DIAGNOSIS — R42 Dizziness and giddiness: Secondary | ICD-10-CM | POA: Diagnosis not present

## 2022-03-04 DIAGNOSIS — R072 Precordial pain: Secondary | ICD-10-CM | POA: Insufficient documentation

## 2022-03-04 DIAGNOSIS — Z5321 Procedure and treatment not carried out due to patient leaving prior to being seen by health care provider: Secondary | ICD-10-CM | POA: Insufficient documentation

## 2022-03-04 DIAGNOSIS — M25561 Pain in right knee: Secondary | ICD-10-CM | POA: Insufficient documentation

## 2022-03-04 DIAGNOSIS — R519 Headache, unspecified: Secondary | ICD-10-CM | POA: Diagnosis present

## 2022-03-04 LAB — COMPREHENSIVE METABOLIC PANEL
ALT: 16 U/L (ref 0–44)
AST: 31 U/L (ref 15–41)
Albumin: 2.5 g/dL — ABNORMAL LOW (ref 3.5–5.0)
Alkaline Phosphatase: 102 U/L (ref 38–126)
Anion gap: 3 — ABNORMAL LOW (ref 5–15)
BUN: 5 mg/dL — ABNORMAL LOW (ref 6–20)
CO2: 22 mmol/L (ref 22–32)
Calcium: 8 mg/dL — ABNORMAL LOW (ref 8.9–10.3)
Chloride: 113 mmol/L — ABNORMAL HIGH (ref 98–111)
Creatinine, Ser: 0.79 mg/dL (ref 0.61–1.24)
GFR, Estimated: >60 mL/min (ref >60)
Glucose, Bld: 98 mg/dL (ref 70–99)
Potassium: 3.6 mmol/L (ref 3.5–5.1)
Sodium: 138 mmol/L (ref 135–145)
Total Bilirubin: 0.4 mg/dL (ref 0.3–1.2)
Total Protein: 5.3 g/dL — ABNORMAL LOW (ref 6.5–8.1)

## 2022-03-04 LAB — CBC
HCT: 32.1 % — ABNORMAL LOW (ref 39.0–52.0)
Hemoglobin: 10.2 g/dL — ABNORMAL LOW (ref 13.0–17.0)
MCH: 29.9 pg (ref 26.0–34.0)
MCHC: 31.8 g/dL (ref 30.0–36.0)
MCV: 94.1 fL (ref 80.0–100.0)
Platelets: 213 10*3/uL (ref 150–400)
RBC: 3.41 MIL/uL — ABNORMAL LOW (ref 4.22–5.81)
RDW: 16.2 % — ABNORMAL HIGH (ref 11.5–15.5)
WBC: 7.2 10*3/uL (ref 4.0–10.5)
nRBC: 0 % (ref 0.0–0.2)

## 2022-03-04 LAB — DIFFERENTIAL
Abs Immature Granulocytes: 0.06 10*3/uL (ref 0.00–0.07)
Basophils Absolute: 0.1 10*3/uL (ref 0.0–0.1)
Basophils Relative: 1 %
Eosinophils Absolute: 0.1 10*3/uL (ref 0.0–0.5)
Eosinophils Relative: 2 %
Immature Granulocytes: 1 %
Lymphocytes Relative: 36 %
Lymphs Abs: 2.6 10*3/uL (ref 0.7–4.0)
Monocytes Absolute: 1 10*3/uL (ref 0.1–1.0)
Monocytes Relative: 13 %
Neutro Abs: 3.4 10*3/uL (ref 1.7–7.7)
Neutrophils Relative %: 47 %

## 2022-03-04 LAB — PROTIME-INR
INR: 1.2 (ref 0.8–1.2)
Prothrombin Time: 14.6 seconds (ref 11.4–15.2)

## 2022-03-04 LAB — APTT: aPTT: 27 seconds (ref 24–36)

## 2022-03-04 LAB — LIPASE, BLOOD: Lipase: 18 U/L (ref 11–51)
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
## 2022-03-04 NOTE — ED Notes (Signed)
Pt name called 3x for updated vitals, no response  
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
## 2022-03-11 ENCOUNTER — Emergency Department (HOSPITAL_COMMUNITY)
Admission: EM | Admit: 2022-03-11 | Discharge: 2022-03-12 | Disposition: A | Discharge status: Home / Self Care | Payer: Medicaid Other | Attending: Emergency Medicine | Admitting: Emergency Medicine

## 2022-03-11 ENCOUNTER — Other Ambulatory Visit: Payer: Self-pay

## 2022-03-11 ENCOUNTER — Encounter (HOSPITAL_COMMUNITY): Payer: Self-pay

## 2022-03-11 DIAGNOSIS — K861 Other chronic pancreatitis: Secondary | ICD-10-CM | POA: Diagnosis not present

## 2022-03-11 DIAGNOSIS — R197 Diarrhea, unspecified: Secondary | ICD-10-CM | POA: Diagnosis not present

## 2022-03-11 DIAGNOSIS — R600 Localized edema: Secondary | ICD-10-CM | POA: Diagnosis not present

## 2022-03-11 DIAGNOSIS — R109 Unspecified abdominal pain: Secondary | ICD-10-CM | POA: Diagnosis present

## 2022-03-11 NOTE — ED Notes (Signed)
This staff member could not find a vein, pt stated he is a hard stick

## 2022-03-11 NOTE — ED Triage Notes (Signed)
Pt presents to ED from home with c/o N/V x 1 week and diarrhea ongoing since memorial day. Pt endorses weakness and just overall feeling poorly. Hx pancreatitis

## 2022-03-11 NOTE — ED Notes (Signed)
Pt advises unable to void at this time for urine sample. Will monitor.

## 2022-03-12 ENCOUNTER — Emergency Department (HOSPITAL_COMMUNITY): Payer: Medicaid Other

## 2022-03-12 ENCOUNTER — Other Ambulatory Visit: Payer: Self-pay

## 2022-03-12 LAB — COMPREHENSIVE METABOLIC PANEL
ALT: 28 U/L (ref 0–44)
AST: 36 U/L (ref 15–41)
Albumin: 2.8 g/dL — ABNORMAL LOW (ref 3.5–5.0)
Alkaline Phosphatase: 109 U/L (ref 38–126)
Anion gap: 4 — ABNORMAL LOW (ref 5–15)
BUN: <5 mg/dL — ABNORMAL LOW (ref 6–20)
CO2: 23 mmol/L (ref 22–32)
Calcium: 8 mg/dL — ABNORMAL LOW (ref 8.9–10.3)
Chloride: 111 mmol/L (ref 98–111)
Creatinine, Ser: 0.82 mg/dL (ref 0.61–1.24)
GFR, Estimated: >60 mL/min (ref >60)
Glucose, Bld: 125 mg/dL — ABNORMAL HIGH (ref 70–99)
Potassium: 3.3 mmol/L — ABNORMAL LOW (ref 3.5–5.1)
Sodium: 138 mmol/L (ref 135–145)
Total Bilirubin: 0.5 mg/dL (ref 0.3–1.2)
Total Protein: 5.8 g/dL — ABNORMAL LOW (ref 6.5–8.1)

## 2022-03-12 LAB — CBC
HCT: 34.3 % — ABNORMAL LOW (ref 39.0–52.0)
Hemoglobin: 10.9 g/dL — ABNORMAL LOW (ref 13.0–17.0)
MCH: 29.4 pg (ref 26.0–34.0)
MCHC: 31.8 g/dL (ref 30.0–36.0)
MCV: 92.5 fL (ref 80.0–100.0)
Platelets: 164 10*3/uL (ref 150–400)
RBC: 3.71 MIL/uL — ABNORMAL LOW (ref 4.22–5.81)
RDW: 15.7 % — ABNORMAL HIGH (ref 11.5–15.5)
WBC: 7.4 10*3/uL (ref 4.0–10.5)
nRBC: 0 % (ref 0.0–0.2)

## 2022-03-12 LAB — URINALYSIS, ROUTINE W REFLEX MICROSCOPIC
Bilirubin Urine: NEGATIVE
Glucose, UA: NEGATIVE mg/dL
Hgb urine dipstick: NEGATIVE
Ketones, ur: NEGATIVE mg/dL
Leukocytes,Ua: NEGATIVE
Nitrite: NEGATIVE
Protein, ur: NEGATIVE mg/dL
Specific Gravity, Urine: 1.017 (ref 1.005–1.030)
pH: 5 (ref 5.0–8.0)

## 2022-03-12 LAB — LIPASE, BLOOD: Lipase: 18 U/L (ref 11–51)

## 2022-03-12 LAB — BRAIN NATRIURETIC PEPTIDE: B Natriuretic Peptide: 112.3 pg/mL — ABNORMAL HIGH (ref 0.0–100.0)

## 2022-03-12 LAB — MAGNESIUM: Magnesium: 1.7 mg/dL (ref 1.7–2.4)

## 2022-03-12 MED ORDER — ACETAMINOPHEN 325 MG PO TABS
650.0000 mg | ORAL_TABLET | Freq: Once | ORAL | Status: AC
  Administered 2022-03-12: 650 mg via ORAL
  Filled 2022-03-12: qty 2

## 2022-03-12 MED ORDER — PROMETHAZINE HCL 25 MG PO TABS
25.0000 mg | ORAL_TABLET | Freq: Three times a day (TID) | ORAL | 0 refills | Status: DC | PRN
  Filled 2022-03-12: qty 20, 7d supply, fill #0

## 2022-03-12 MED ORDER — FUROSEMIDE 10 MG/ML IJ SOLN
20.0000 mg | Freq: Once | INTRAMUSCULAR | Status: AC
  Administered 2022-03-12: 20 mg via INTRAVENOUS
  Filled 2022-03-12: qty 4

## 2022-03-12 MED ORDER — POTASSIUM CHLORIDE 20 MEQ PO PACK
20.0000 meq | PACK | Freq: Two times a day (BID) | ORAL | Status: DC
Start: 2022-03-12 — End: 2022-03-12
  Administered 2022-03-12: 20 meq via ORAL
  Filled 2022-03-12: qty 1

## 2022-03-12 NOTE — ED Provider Notes (Signed)
[facility] DEPT Provider Note   CSN: [id number] Arrival date & time: 03/11/22  2252     History  Chief Complaint  Patient presents with   Emesis   Abdominal Pain    Brent Rogers is a 56 y.o. male.  The history is provided by the patient and medical records.  Emesis Associated symptoms: abdominal pain   Abdominal Pain Associated symptoms: vomiting   Brent Rogers is a 56 y.o. male who presents to the Emergency Department complaining of diarrhea.  He presents emergency department for evaluation of 2 weeks of persistent and progressive diarrhea.  He is having light-colored stools, 10+ per day.  He has associated nausea, generalized weakness.  He does have occasional vomiting, and vomited once yesterday.  He also reports left-sided abdominal pain.  He does have a history of recurrent pain in this area.  No fever. No recent antibiotic use or sick contacts.  No shortness of breath.  He does report decreased urinary output as well as leg swelling for the last 2 to 3 weeks.  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 Occ alcohol.  No street drugs.      Home Medications Prior to Admission medications   Medication Sig Start Date End Date Taking? Authorizing Provider  lipase/protease/amylase (CREON) 36000 UNITS CPEP capsule Take 1 capsule (36,000 Units total) by mouth with breakfast, with lunch, and with evening meal. 01/10/22  Yes Curatolo, Adam, DO  ondansetron (ZOFRAN) 4 MG tablet Take 1 tablet (4 mg total) by mouth every 8 (eight) hours as needed for nausea or vomiting. 12/03/21  Yes Dorothyann Peng, PA-C  pantoprazole (PROTONIX) 40 MG tablet Take 1 tablet (40 mg total) by mouth 2 (two) times daily. 01/10/22 03/13/23 Yes Curatolo, Adam, DO  metoCLOPramide (REGLAN) 10 MG tablet Take 1 tablet (10 mg total) by mouth every 6 (six) hours as needed for nausea or vomiting. Patient not taking: Reported on 03/12/2022 02/03/22   Molpus, Jenny Reichmann, MD   promethazine (PHENERGAN) 25 MG tablet Take 1 tablet (25 mg total) by mouth every 8 (eight) hours as needed for nausea or vomiting. 03/12/22   Quintella Reichert, MD      Allergies    Patient has no known allergies.    Review of Systems   Review of Systems  Gastrointestinal:  Positive for abdominal pain and vomiting.  All other systems reviewed and are negative.   Physical Exam Updated Vital Signs BP (!) 138/95   Pulse (!) 52   Temp 98.4 F (36.9 C) (Oral)   Resp 12   Ht '6\' 3"'$  (1.905 m)   Wt 60.1 kg   SpO2 100%   BMI 16.56 kg/m  Physical Exam Vitals and nursing note reviewed.  Constitutional:      Appearance: He is well-developed.  HENT:     Head: Normocephalic and atraumatic.  Cardiovascular:     Rate and Rhythm: Normal rate and regular rhythm.     Heart sounds: No murmur heard. Pulmonary:     Effort: Pulmonary effort is normal. No respiratory distress.     Breath sounds: Normal breath sounds.  Abdominal:     Palpations: Abdomen is soft.     Tenderness: There is no abdominal tenderness. There is no guarding or rebound.     Comments: No appreciable tenderness to palpation.  Healed midline surgical scar  Musculoskeletal:        General: No tenderness.     Comments: 2-3+ pitting edema  to BLE  Skin:    General: Skin is warm and dry.  Neurological:     Mental Status: He is alert and oriented to person, place, and time.  Psychiatric:        Behavior: Behavior normal.     ED Results / Procedures / Treatments   Labs (all labs ordered are listed, but only abnormal results are displayed) Labs Reviewed  COMPREHENSIVE METABOLIC PANEL - Abnormal; Notable for the following components:      Result Value   Potassium 3.3 (*)    Glucose, Bld 125 (*)    BUN <5 (*)    Calcium 8.0 (*)    Total Protein 5.8 (*)    Albumin 2.8 (*)    Anion gap 4 (*)    All other components within normal limits  CBC - Abnormal; Notable for the following components:   RBC 3.71 (*)     Hemoglobin 10.9 (*)    HCT 34.3 (*)    RDW 15.7 (*)    All other components within normal limits  BRAIN NATRIURETIC PEPTIDE - Abnormal; Notable for the following components:   B Natriuretic Peptide 112.3 (*)    All other components within normal limits  GASTROINTESTINAL PANEL BY PCR, STOOL (REPLACES STOOL CULTURE)  C DIFFICILE QUICK SCREEN W PCR REFLEX    LIPASE, BLOOD  URINALYSIS, ROUTINE W REFLEX MICROSCOPIC  MAGNESIUM    EKG EKG Interpretation  Date/Time:  Wednesday March 12 2022 00:48:53 EDT Ventricular Rate:  60 PR Interval:  155 QRS Duration: 82 QT Interval:  450 QTC Calculation: 450 R Axis:   48 Text Interpretation: Sinus rhythm LAE, consider biatrial enlargement Borderline low voltage, extremity leads Confirmed by Quintella Reichert [phone]) on 03/12/2022 12:52:24 AM  Radiology DG Chest 2 View  Result Date: 03/12/2022 CLINICAL DATA:  Pulmonary edema EXAM: CHEST - 2 VIEW COMPARISON:  03/04/2022 FINDINGS: The heart size and mediastinal contours are within normal limits. Both lungs are clear. The visualized skeletal structures are unremarkable. IMPRESSION: No active cardiopulmonary disease. Electronically Signed   By: Fidela Salisbury M.D.   On: 03/12/2022 02:16    Procedures Procedures    Medications Ordered in ED Medications  furosemide (LASIX) injection 20 mg (has no administration in time range)  potassium chloride (KLOR-CON) packet 20 mEq (has no administration in time range)  acetaminophen (TYLENOL) tablet 650 mg (has no administration in time range)    ED Course/ Medical Decision Making/ A&P                           Medical Decision Making Amount and/or Complexity of Data Reviewed Labs: ordered. Radiology: ordered.  Risk OTC drugs. Prescription drug management.   Patient with history of chronic pancreatitis here for evaluation of 2 weeks of diarrhea.  He also complains of lower extremity edema.  He is nontoxic-appearing on evaluation.  He does have lower  extremity edema without any evidence of pulmonary edema or CHF on examination or imaging.  Labs with mild hypokalemia.  BNP is minimally elevated and similar when compared to priors.  In terms of his lower extremity edema, current clinical picture is not consistent with acute CHF, DVT.  His albumin is low, suspect that this may be in part due to his nutritional status.  Patient is requesting antidiarrheal medication, recommend that he have stool studies obtained prior to initiating any sort of antidiarrheal medication.  Discussed that these will not be returned immediately.  We  will send Phenergan to pharmacy for him to take as needed for nausea.  Feel at this point he is stable to be discharged with outpatient follow-up and return precautions.        Final Clinical Impression(s) / ED Diagnoses Final diagnoses:  Chronic pancreatitis, unspecified pancreatitis type ([place])  Diarrhea, unspecified type  Bilateral lower extremity edema    Rx / DC Orders ED Discharge Orders          Ordered    promethazine (PHENERGAN) 25 MG tablet  Every 8 hours PRN        03/12/22 0341              Quintella Reichert, MD 03/12/22 [phone]

## 2022-03-19 ENCOUNTER — Other Ambulatory Visit: Payer: Self-pay

## 2022-04-14 ENCOUNTER — Other Ambulatory Visit: Payer: Self-pay

## 2022-04-14 ENCOUNTER — Encounter (HOSPITAL_COMMUNITY): Payer: Self-pay

## 2022-04-14 ENCOUNTER — Emergency Department (HOSPITAL_COMMUNITY)
Admission: EM | Admit: 2022-04-14 | Discharge: 2022-04-15 | Disposition: A | Discharge status: Home / Self Care | Payer: Medicaid Other | Attending: Emergency Medicine | Admitting: Emergency Medicine

## 2022-04-14 DIAGNOSIS — I10 Essential (primary) hypertension: Secondary | ICD-10-CM | POA: Diagnosis not present

## 2022-04-14 DIAGNOSIS — R112 Nausea with vomiting, unspecified: Secondary | ICD-10-CM | POA: Insufficient documentation

## 2022-04-14 DIAGNOSIS — E8809 Other disorders of plasma-protein metabolism, not elsewhere classified: Secondary | ICD-10-CM | POA: Insufficient documentation

## 2022-04-14 DIAGNOSIS — M79644 Pain in right finger(s): Secondary | ICD-10-CM | POA: Diagnosis not present

## 2022-04-14 DIAGNOSIS — R1012 Left upper quadrant pain: Secondary | ICD-10-CM | POA: Diagnosis not present

## 2022-04-14 DIAGNOSIS — R197 Diarrhea, unspecified: Secondary | ICD-10-CM | POA: Insufficient documentation

## 2022-04-14 DIAGNOSIS — R1032 Left lower quadrant pain: Secondary | ICD-10-CM | POA: Diagnosis not present

## 2022-04-14 DIAGNOSIS — R300 Dysuria: Secondary | ICD-10-CM | POA: Diagnosis not present

## 2022-04-14 DIAGNOSIS — R42 Dizziness and giddiness: Secondary | ICD-10-CM | POA: Diagnosis not present

## 2022-04-14 DIAGNOSIS — R55 Syncope and collapse: Secondary | ICD-10-CM | POA: Insufficient documentation

## 2022-04-14 DIAGNOSIS — D649 Anemia, unspecified: Secondary | ICD-10-CM | POA: Diagnosis not present

## 2022-04-14 NOTE — ED Triage Notes (Signed)
Pt reports with nausea, vomiting, and diarrhea x 1 week. Pt states that he thinks that his chest is hurting from his acid reflux. Pt reports feeling weaker, and states that he has been falling recently.

## 2022-04-15 ENCOUNTER — Emergency Department (HOSPITAL_COMMUNITY): Payer: Medicaid Other

## 2022-04-15 ENCOUNTER — Encounter (HOSPITAL_COMMUNITY): Payer: Self-pay | Admitting: Student

## 2022-04-15 LAB — URINALYSIS, ROUTINE W REFLEX MICROSCOPIC
Bacteria, UA: NONE SEEN
Bilirubin Urine: NEGATIVE
Glucose, UA: NEGATIVE mg/dL
Hgb urine dipstick: NEGATIVE
Ketones, ur: NEGATIVE mg/dL
Leukocytes,Ua: NEGATIVE
Nitrite: NEGATIVE
Protein, ur: NEGATIVE mg/dL
Specific Gravity, Urine: >1.046 — ABNORMAL HIGH (ref 1.005–1.030)
pH: 5 (ref 5.0–8.0)

## 2022-04-15 LAB — CBC WITH DIFFERENTIAL/PLATELET
Abs Immature Granulocytes: 0.31 10*3/uL — ABNORMAL HIGH (ref 0.00–0.07)
Basophils Absolute: 0.1 10*3/uL (ref 0.0–0.1)
Basophils Relative: 1 %
Eosinophils Absolute: 0.1 10*3/uL (ref 0.0–0.5)
Eosinophils Relative: 1 %
HCT: 39.9 % (ref 39.0–52.0)
Hemoglobin: 12.8 g/dL — ABNORMAL LOW (ref 13.0–17.0)
Immature Granulocytes: 3 %
Lymphocytes Relative: 26 %
Lymphs Abs: 2.6 10*3/uL (ref 0.7–4.0)
MCH: 29.1 pg (ref 26.0–34.0)
MCHC: 32.1 g/dL (ref 30.0–36.0)
MCV: 90.7 fL (ref 80.0–100.0)
Monocytes Absolute: 1.3 10*3/uL — ABNORMAL HIGH (ref 0.1–1.0)
Monocytes Relative: 13 %
Neutro Abs: 5.7 10*3/uL (ref 1.7–7.7)
Neutrophils Relative %: 56 %
Platelets: 267 10*3/uL (ref 150–400)
RBC: 4.4 MIL/uL (ref 4.22–5.81)
RDW: 14.6 % (ref 11.5–15.5)
WBC: 10 10*3/uL (ref 4.0–10.5)
nRBC: 0 % (ref 0.0–0.2)

## 2022-04-15 LAB — COMPREHENSIVE METABOLIC PANEL
ALT: 24 U/L (ref 0–44)
AST: 37 U/L (ref 15–41)
Albumin: 3.1 g/dL — ABNORMAL LOW (ref 3.5–5.0)
Alkaline Phosphatase: 196 U/L — ABNORMAL HIGH (ref 38–126)
Anion gap: 8 (ref 5–15)
BUN: 6 mg/dL (ref 6–20)
CO2: 23 mmol/L (ref 22–32)
Calcium: 9.3 mg/dL (ref 8.9–10.3)
Chloride: 107 mmol/L (ref 98–111)
Creatinine, Ser: 0.68 mg/dL (ref 0.61–1.24)
GFR, Estimated: >60 mL/min (ref >60)
Glucose, Bld: 117 mg/dL — ABNORMAL HIGH (ref 70–99)
Potassium: 4.2 mmol/L (ref 3.5–5.1)
Sodium: 138 mmol/L (ref 135–145)
Total Bilirubin: 0.6 mg/dL (ref 0.3–1.2)
Total Protein: 7 g/dL (ref 6.5–8.1)

## 2022-04-15 LAB — LIPASE, BLOOD: Lipase: 17 U/L (ref 11–51)

## 2022-04-15 MED ORDER — SODIUM CHLORIDE 0.9 % IV BOLUS
1000.0000 mL | Freq: Once | INTRAVENOUS | Status: AC
  Administered 2022-04-15: 1000 mL via INTRAVENOUS

## 2022-04-15 MED ORDER — FAMOTIDINE IN NACL 20-0.9 MG/50ML-% IV SOLN
20.0000 mg | Freq: Once | INTRAVENOUS | Status: AC
Start: 2022-04-15 — End: 2022-04-15
  Administered 2022-04-15: 20 mg via INTRAVENOUS
  Filled 2022-04-15: qty 50

## 2022-04-15 MED ORDER — SODIUM CHLORIDE (PF) 0.9 % IJ SOLN
INTRAMUSCULAR | Status: DC
Start: 2022-04-15 — End: 2022-04-15
  Filled 2022-04-15: qty 50

## 2022-04-15 MED ORDER — PANTOPRAZOLE SODIUM 40 MG IV SOLR
40.0000 mg | Freq: Once | INTRAVENOUS | Status: AC
Start: 2022-04-15 — End: 2022-04-15
  Administered 2022-04-15: 40 mg via INTRAVENOUS
  Filled 2022-04-15: qty 10

## 2022-04-15 MED ORDER — ONDANSETRON HCL 4 MG/2ML IJ SOLN
4.0000 mg | Freq: Once | INTRAMUSCULAR | Status: AC
  Administered 2022-04-15: 4 mg via INTRAVENOUS
  Filled 2022-04-15: qty 2

## 2022-04-15 MED ORDER — MORPHINE SULFATE (PF) 4 MG/ML IV SOLN
4.0000 mg | Freq: Once | INTRAVENOUS | Status: AC
  Administered 2022-04-15: 4 mg via INTRAVENOUS
  Filled 2022-04-15: qty 1

## 2022-04-15 MED ORDER — IOHEXOL 300 MG/ML  SOLN
100.0000 mL | Freq: Once | INTRAMUSCULAR | Status: AC | PRN
  Administered 2022-04-15: 100 mL via INTRAVENOUS

## 2022-04-15 NOTE — Discharge Instructions (Addendum)
Your urinalysis did not show any signs of infection on today's visit.  Laboratory results along with your CT scan of the abdomen were within normal limits.  Please follow-up with your primary care physician as needed.

## 2022-04-15 NOTE — ED Provider Notes (Signed)
  Physical Exam  BP 116/77   Pulse 66   Temp 98.3 F (36.8 C) (Oral)   Resp 16   SpO2 92%   Physical Exam Vitals and nursing note reviewed.  Constitutional:      Appearance: Normal appearance. He is not ill-appearing.  HENT:     Head: Normocephalic and atraumatic.     Nose: Nose normal.     Mouth/Throat:     Mouth: Mucous membranes are moist.  Cardiovascular:     Rate and Rhythm: Normal rate.  Pulmonary:     Effort: Pulmonary effort is normal.  Abdominal:     General: Abdomen is flat.  Musculoskeletal:     Cervical back: Normal range of motion and neck supple.  Skin:    General: Skin is warm and dry.  Neurological:     Mental Status: He is alert and oriented to person, place, and time.     Procedures  Procedures  ED Course / MDM    Medical Decision Making Amount and/or Complexity of Data Reviewed Labs: ordered. Radiology: ordered.  Risk Prescription drug management.  Patient care assumed from [facility] PA-C at shift change, please see her note for full HPI.  Briefly, patient here with near syncopal episode due to dysuria and decreasing urinary output.  Prior history of pancreatitis with some left lower quadrant tenderness and a negative CT.  Given morphine and Zofran for symptomatic treatment.  Plan is for pending urine, fluids along with reassessment.  9:02 AM patient reevaluated by me, given urinal.  Is resting comfortably with stable vital signs.  10:08 AM UA with no signs of infection, no white blood cell count, no bacteria noted.  Patient is eating and drinking adequately after medication with improvement in symptoms.  I do feel that he is stable for discharge at this time.  Patient discharged from the ED in stable condition.   Portions of this note were generated with [employer]. Dictation errors may occur despite best attempts at proofreading.        Janeece Fitting, PA-C 04/15/22 1010    Truddie Hidden, MD 04/21/22 0110

## 2022-04-15 NOTE — ED Notes (Signed)
Patient given sausage egg and cheese biscuit and diet ginger ale.

## 2022-04-15 NOTE — ED Notes (Signed)
Save green tube in main lab

## 2022-04-15 NOTE — ED Provider Notes (Signed)
[place] DEPT Provider Note   CSN: [id number] Arrival date & time: 04/14/22  2300     History  Chief Complaint  Patient presents with   Nausea   Emesis   Diarrhea   Weakness    ACEY WOODFIELD is a 56 y.o. male with a hx of chronic pancreatitis, hypertension, depression, prior GI bleed, anxiety, GERD, and prior cholecystectomy & whipple who presents to the ED with complaints of nausea, vomiting, and diarrhea x1 week. Multiple episodes of emesis per day w/ emesis every time he tries to eat with TNTC episodes of diarrhea per day. He is having associated left sided abdominal pain, some of which is similar to his intermittent chronic pancreatitis pain, currently a 9/10 in severity, also having acid reflux.  Notes some dysuria.  Has felt generally weak with his lack of Po intake, had a near syncopal episode with lightheadedness leading to a fall onto his right hand.  Denies head injury or loss of consciousness.  He has had pain to the base of the right thumb since then, worse with movement, no alleviating factors.  He denies fever, hematemesis, melena, dyspnea, recent foreign travel, or recent antibiotic use.  HPI     Home Medications Prior to Admission medications   Medication Sig Start Date End Date Taking? Authorizing Provider  lipase/protease/amylase (CREON) 36000 UNITS CPEP capsule Take 1 capsule (36,000 Units total) by mouth with breakfast, with lunch, and with evening meal. 01/10/22   Curatolo, Adam, DO  metoCLOPramide (REGLAN) 10 MG tablet Take 1 tablet (10 mg total) by mouth every 6 (six) hours as needed for nausea or vomiting. Patient not taking: Reported on 03/12/2022 02/03/22   Molpus, Jenny Reichmann, MD  ondansetron (ZOFRAN) 4 MG tablet Take 1 tablet (4 mg total) by mouth every 8 (eight) hours as needed for nausea or vomiting. 12/03/21   Dorothyann Peng, PA-C  pantoprazole (PROTONIX) 40 MG tablet Take 1 tablet (40 mg total) by mouth 2 (two) times daily.  01/10/22 03/13/23  Curatolo, Adam, DO  promethazine (PHENERGAN) 25 MG tablet Take 1 tablet (25 mg total) by mouth once every 8 (eight) hours as needed for nausea or vomiting. 03/12/22   Quintella Reichert, MD      Allergies    Patient has no known allergies.    Review of Systems   Review of Systems  Constitutional:  Negative for fever.  Respiratory:  Negative for shortness of breath.   Gastrointestinal:  Positive for abdominal pain, diarrhea, nausea and vomiting. Negative for anal bleeding and blood in stool.  Genitourinary:  Positive for dysuria.  Musculoskeletal:  Positive for arthralgias.  Neurological:  Positive for syncope and weakness.  All other systems reviewed and are negative.   Physical Exam Updated Vital Signs BP (!) 110/93 (BP Location: Left Arm)   Pulse 72   Temp 98.7 F (37.1 C) (Oral)   Resp 16   SpO2 100%  Physical Exam Vitals and nursing note reviewed.  Constitutional:      General: He is not in acute distress.    Appearance: He is well-developed. He is not toxic-appearing.  HENT:     Head: Normocephalic and atraumatic.     Mouth/Throat:     Mouth: Mucous membranes are dry.  Eyes:     General:        Right eye: No discharge.        Left eye: No discharge.     Conjunctiva/sclera: Conjunctivae normal.  Cardiovascular:  Rate and Rhythm: Normal rate and regular rhythm.     Pulses:          Radial pulses are 2+ on the right side and 2+ on the left side.  Pulmonary:     Effort: No respiratory distress.     Breath sounds: Normal breath sounds. No wheezing or rales.  Chest:     Chest wall: No tenderness.  Abdominal:     General: There is no distension.     Palpations: Abdomen is soft.     Tenderness: There is abdominal tenderness in the left upper quadrant and left lower quadrant. There is no guarding or rebound.  Musculoskeletal:     Cervical back: Normal range of motion and neck supple. No spinous process tenderness.     Comments: Upper extremities:  Small abrasion to the right thenar eminence.  No subcutaneous tissue exposure.  Intact active range of motion throughout.  Patient is tender to palpation to the right first MCP joint, anatomical snuffbox, and radial wrist.  Otherwise nontender Back: No midline tenderness.  Skin:    General: Skin is warm and dry.  Neurological:     Mental Status: He is alert.     Comments: Clear speech.  Sensation grossly tact bilateral upper extremities.  5-5 symmetric grip strength.  Psychiatric:        Behavior: Behavior normal.    ED Results / Procedures / Treatments   Labs (all labs ordered are listed, but only abnormal results are displayed) Labs Reviewed  COMPREHENSIVE METABOLIC PANEL - Abnormal; Notable for the following components:      Result Value   Glucose, Bld 117 (*)    Albumin 3.1 (*)    Alkaline Phosphatase 196 (*)    All other components within normal limits  CBC WITH DIFFERENTIAL/PLATELET - Abnormal; Notable for the following components:   Hemoglobin 12.8 (*)    Monocytes Absolute 1.3 (*)    Abs Immature Granulocytes 0.31 (*)    All other components within normal limits  LIPASE, BLOOD  URINALYSIS, ROUTINE W REFLEX MICROSCOPIC    EKG EKG Interpretation  Date/Time:  Monday April 14 2022 23:34:29 EDT Ventricular Rate:  82 PR Interval:  157 QRS Duration: 83 QT Interval:  368 QTC Calculation: 430 R Axis:   75 Text Interpretation: Sinus rhythm Left atrial enlargement Minimal ST elevation, inferior leads No significant change since last tracing Confirmed by Calvert Cantor [phone]) on 04/15/2022 12:41:53 AM  Radiology CT Abdomen Pelvis W Contrast  Result Date: 04/15/2022 CLINICAL DATA:  Left lower quadrant abdominal pain with nausea, vomiting, and diarrhea for 1 week. EXAM: CT ABDOMEN AND PELVIS WITH CONTRAST TECHNIQUE: Multidetector CT imaging of the abdomen and pelvis was performed using the standard protocol following bolus administration of intravenous contrast. RADIATION DOSE  REDUCTION: This exam was performed according to the departmental dose-optimization program which includes automated exposure control, adjustment of the mA and/or kV according to patient size and/or use of iterative reconstruction technique. CONTRAST:  138m OMNIPAQUE IOHEXOL 300 MG/ML  SOLN COMPARISON:  01/10/2022 FINDINGS: Lower chest:  No contributory findings. Hepatobiliary: No focal liver abnormality.Cholecystectomy. Postoperative pneumobilia. Pancreas: History of Whipple procedure with atrophy of the remaining gland. No acute finding or collection. Spleen: Negative Adrenals/Urinary Tract: Negative adrenals. No hydronephrosis or stone. Unremarkable bladder. Stomach/Bowel: Distal gastrectomy. Vascular/Lymphatic: No acute vascular abnormality. No mass or adenopathy. Reproductive:No pathologic findings. Other: No ascites or pneumoperitoneum. Musculoskeletal: No acute abnormalities. Scoliosis and ordinary degenerative changes. IMPRESSION: No acute or interval finding. Electronically  Signed   By: Jorje Guild M.D.   On: 04/15/2022 06:28    Procedures Procedures    Medications Ordered in ED Medications - No data to display  ED Course/ Medical Decision Making/ A&P                           Medical Decision Making Amount and/or Complexity of Data Reviewed Labs: ordered. Radiology: ordered.  Risk Prescription drug management.  Patient presents to the ED with complaints of N/V/D, this involves an extensive number of treatment options, and is a complaint that carries with it a high risk of complications and morbidity. Nontoxic, vitals without significant abnormalities.  Additional history obtained:  Chart/nursing notes reviewed.  EKG: No significant change since last tracing  Lab Tests:  I viewed & interpreted labs including:  CBC: mild anemia improved from prior.  CMP: mild hypoalbuminemia similar to prior.  Lipase: WNL UA: Pending.   Imaging Studies:  I ordered and viewed the  following imaging, agree with radiologist impression:  CT A/P: No acute or interval finding. Right wrist/hand xray: Pending.  If x-rays of the right upper extremity are negative, anticipate placing in thumb spica brace due to anatomical snuffbox tenderness, he is neurovascular intact distally.   ED Course:  I ordered medications including morphine for pain, Zofran for nausea, Pepcid for antiacid, and fluids for hydration.   RUE xrays & UA pending.  06:40: Patient care signed out to Wallace @ shift change pending remaining work up and disposition. If symptomatically improved & tolerating PO anticipate discharge.   Portions of this note were generated with [employer]. Dictation errors may occur despite best attempts at proofreading.  Final Clinical Impression(s) / ED Diagnoses Final diagnoses:  None    Rx / DC Orders ED Discharge Orders     None         Amaryllis Dyke, PA-C 04/15/22 7001    Truddie Hidden, MD 04/15/22 [phone]

## 2022-05-29 ENCOUNTER — Other Ambulatory Visit: Payer: Self-pay

## 2022-05-29 ENCOUNTER — Encounter (HOSPITAL_COMMUNITY): Payer: Self-pay | Admitting: Emergency Medicine

## 2022-05-29 ENCOUNTER — Emergency Department (HOSPITAL_COMMUNITY): Payer: Medicaid Other

## 2022-05-29 ENCOUNTER — Emergency Department (HOSPITAL_COMMUNITY)
Admission: EM | Admit: 2022-05-29 | Discharge: 2022-05-30 | Discharge status: Against Medical Advice | Payer: Medicaid Other | Attending: Emergency Medicine | Admitting: Emergency Medicine

## 2022-05-29 DIAGNOSIS — R2243 Localized swelling, mass and lump, lower limb, bilateral: Secondary | ICD-10-CM | POA: Diagnosis present

## 2022-05-29 DIAGNOSIS — Z5321 Procedure and treatment not carried out due to patient leaving prior to being seen by health care provider: Secondary | ICD-10-CM | POA: Diagnosis not present

## 2022-05-29 DIAGNOSIS — R309 Painful micturition, unspecified: Secondary | ICD-10-CM | POA: Diagnosis not present

## 2022-05-29 LAB — BASIC METABOLIC PANEL
Anion gap: <0 — ABNORMAL LOW (ref 5–15)
BUN: <5 mg/dL — ABNORMAL LOW (ref 6–20)
CO2: 31 mmol/L (ref 22–32)
Calcium: 8.4 mg/dL — ABNORMAL LOW (ref 8.9–10.3)
Chloride: 111 mmol/L (ref 98–111)
Creatinine, Ser: 0.79 mg/dL (ref 0.61–1.24)
GFR, Estimated: >60 mL/min (ref >60)
Glucose, Bld: 72 mg/dL (ref 70–99)
Potassium: 3.5 mmol/L (ref 3.5–5.1)
Sodium: 140 mmol/L (ref 135–145)

## 2022-05-29 LAB — CBC
HCT: 34.6 % — ABNORMAL LOW (ref 39.0–52.0)
Hemoglobin: 11.1 g/dL — ABNORMAL LOW (ref 13.0–17.0)
MCH: 29.4 pg (ref 26.0–34.0)
MCHC: 32.1 g/dL (ref 30.0–36.0)
MCV: 91.5 fL (ref 80.0–100.0)
Platelets: 221 10*3/uL (ref 150–400)
RBC: 3.78 MIL/uL — ABNORMAL LOW (ref 4.22–5.81)
RDW: 15.8 % — ABNORMAL HIGH (ref 11.5–15.5)
WBC: 10.8 10*3/uL — ABNORMAL HIGH (ref 4.0–10.5)
nRBC: 0 % (ref 0.0–0.2)

## 2022-05-29 NOTE — ED Notes (Signed)
NA x 1 for triage

## 2022-05-29 NOTE — ED Triage Notes (Signed)
Pt endorses bilateral leg swelling and urinary burning/frequency for 2 weeks. No reported fevers.

## 2022-06-01 ENCOUNTER — Encounter (HOSPITAL_COMMUNITY): Payer: Self-pay | Admitting: Emergency Medicine

## 2022-06-01 ENCOUNTER — Emergency Department (HOSPITAL_COMMUNITY)
Admission: EM | Admit: 2022-06-01 | Discharge: 2022-06-02 | Disposition: A | Discharge status: Home / Self Care | Payer: Medicaid Other | Attending: Emergency Medicine | Admitting: Emergency Medicine

## 2022-06-01 ENCOUNTER — Emergency Department (HOSPITAL_COMMUNITY): Payer: Medicaid Other

## 2022-06-01 ENCOUNTER — Other Ambulatory Visit: Payer: Self-pay

## 2022-06-01 DIAGNOSIS — Z5321 Procedure and treatment not carried out due to patient leaving prior to being seen by health care provider: Secondary | ICD-10-CM | POA: Insufficient documentation

## 2022-06-01 DIAGNOSIS — R079 Chest pain, unspecified: Secondary | ICD-10-CM | POA: Diagnosis not present

## 2022-06-01 DIAGNOSIS — R0602 Shortness of breath: Secondary | ICD-10-CM | POA: Diagnosis not present

## 2022-06-01 DIAGNOSIS — R309 Painful micturition, unspecified: Secondary | ICD-10-CM | POA: Insufficient documentation

## 2022-06-01 DIAGNOSIS — M7989 Other specified soft tissue disorders: Secondary | ICD-10-CM | POA: Insufficient documentation

## 2022-06-01 LAB — CBC WITH DIFFERENTIAL/PLATELET
Abs Immature Granulocytes: 0.04 10*3/uL (ref 0.00–0.07)
Basophils Absolute: 0.2 10*3/uL — ABNORMAL HIGH (ref 0.0–0.1)
Basophils Relative: 2 %
Eosinophils Absolute: 0.2 10*3/uL (ref 0.0–0.5)
Eosinophils Relative: 2 %
HCT: 37.1 % — ABNORMAL LOW (ref 39.0–52.0)
Hemoglobin: 11.7 g/dL — ABNORMAL LOW (ref 13.0–17.0)
Immature Granulocytes: 1 %
Lymphocytes Relative: 45 %
Lymphs Abs: 3.4 10*3/uL (ref 0.7–4.0)
MCH: 28.3 pg (ref 26.0–34.0)
MCHC: 31.5 g/dL (ref 30.0–36.0)
MCV: 89.8 fL (ref 80.0–100.0)
Monocytes Absolute: 0.5 10*3/uL (ref 0.1–1.0)
Monocytes Relative: 7 %
Neutro Abs: 3.3 10*3/uL (ref 1.7–7.7)
Neutrophils Relative %: 43 %
Platelets: 272 10*3/uL (ref 150–400)
RBC: 4.13 MIL/uL — ABNORMAL LOW (ref 4.22–5.81)
RDW: 15.1 % (ref 11.5–15.5)
WBC: 7.6 10*3/uL (ref 4.0–10.5)
nRBC: 0 % (ref 0.0–0.2)

## 2022-06-01 NOTE — ED Provider Triage Note (Signed)
Emergency Medicine Provider Triage Evaluation Note  Brent Rogers , a 56 y.o. male  was evaluated in triage.  Pt with multiple complaints.  Chest, pain, SOB, chronic abdominal pain, LE edema.  States leg swelling worsening x2 weeks.  Does feel some SOB and chest discomfort.  Denies cough, fever, chills.  Review of Systems  Positive: Multiple-- CP, SOB, abdominal pain, LE edema Negative: fever  Physical Exam  BP (!) 149/104 (BP Location: Right Arm)   Pulse 64   Temp 98 F (36.7 C) (Oral)   Resp 16   SpO2 100%  Gen:   Awake, no distress   Resp:  Normal effort, lungs clear MSK:   Moves extremities without difficulty  Other:  1+ LE edema  Medical Decision Making  Medically screening exam initiated at 10:54 PM.  Appropriate orders placed.  Brent Rogers was informed that the remainder of the evaluation will be completed by another provider, this initial triage assessment does not replace that evaluation, and the importance of remaining in the ED until their evaluation is complete.  Multiple complaints-- CP, SOB, abdominal pain, LE edema. VSS in triage, no distress. EKG, labs, CXR.   Brent Pickett, PA-C 06/01/22 2258

## 2022-06-01 NOTE — ED Triage Notes (Signed)
Pt c/o chest pain, shortness of breath, bilateral leg swelling, and pain with urination.

## 2022-06-02 LAB — TROPONIN I (HIGH SENSITIVITY): Troponin I (High Sensitivity): 10 ng/L (ref <18)

## 2022-06-02 LAB — COMPREHENSIVE METABOLIC PANEL
ALT: 17 U/L (ref 0–44)
AST: 39 U/L (ref 15–41)
Albumin: 2.8 g/dL — ABNORMAL LOW (ref 3.5–5.0)
Alkaline Phosphatase: 147 U/L — ABNORMAL HIGH (ref 38–126)
Anion gap: 8 (ref 5–15)
BUN: 5 mg/dL — ABNORMAL LOW (ref 6–20)
CO2: 26 mmol/L (ref 22–32)
Calcium: 8.6 mg/dL — ABNORMAL LOW (ref 8.9–10.3)
Chloride: 109 mmol/L (ref 98–111)
Creatinine, Ser: 0.88 mg/dL (ref 0.61–1.24)
GFR, Estimated: >60 mL/min (ref >60)
Glucose, Bld: 120 mg/dL — ABNORMAL HIGH (ref 70–99)
Potassium: 4.6 mmol/L (ref 3.5–5.1)
Sodium: 143 mmol/L (ref 135–145)
Total Bilirubin: 0.3 mg/dL (ref 0.3–1.2)
Total Protein: 6 g/dL — ABNORMAL LOW (ref 6.5–8.1)

## 2022-06-02 LAB — BRAIN NATRIURETIC PEPTIDE: B Natriuretic Peptide: 38.1 pg/mL (ref 0.0–100.0)

## 2022-06-02 LAB — LIPASE, BLOOD: Lipase: 18 U/L (ref 11–51)

## 2022-06-02 NOTE — ED Notes (Signed)
Pt called for vitals and blood with no response. Pt moved from the floor.

## 2022-06-03 ENCOUNTER — Emergency Department (HOSPITAL_COMMUNITY)
Admission: EM | Admit: 2022-06-03 | Discharge: 2022-06-03 | Discharge status: Against Medical Advice | Payer: Medicaid Other

## 2022-06-03 NOTE — ED Notes (Signed)
Pt called repeatedly for triage, no response

## 2022-06-04 ENCOUNTER — Other Ambulatory Visit: Payer: Self-pay

## 2022-06-04 ENCOUNTER — Encounter (HOSPITAL_COMMUNITY): Payer: Self-pay

## 2022-06-04 ENCOUNTER — Emergency Department (HOSPITAL_COMMUNITY): Payer: Medicaid Other

## 2022-06-04 ENCOUNTER — Emergency Department (HOSPITAL_COMMUNITY)
Admission: EM | Admit: 2022-06-04 | Discharge: 2022-06-05 | Disposition: A | Discharge status: Home / Self Care | Payer: Medicaid Other | Attending: Emergency Medicine | Admitting: Emergency Medicine

## 2022-06-04 DIAGNOSIS — R109 Unspecified abdominal pain: Secondary | ICD-10-CM

## 2022-06-04 DIAGNOSIS — I10 Essential (primary) hypertension: Secondary | ICD-10-CM | POA: Diagnosis not present

## 2022-06-04 DIAGNOSIS — R600 Localized edema: Secondary | ICD-10-CM | POA: Diagnosis not present

## 2022-06-04 DIAGNOSIS — M7989 Other specified soft tissue disorders: Secondary | ICD-10-CM | POA: Diagnosis present

## 2022-06-04 LAB — COMPREHENSIVE METABOLIC PANEL
ALT: 34 U/L (ref 0–44)
AST: 91 U/L — ABNORMAL HIGH (ref 15–41)
Albumin: 3.2 g/dL — ABNORMAL LOW (ref 3.5–5.0)
Alkaline Phosphatase: 167 U/L — ABNORMAL HIGH (ref 38–126)
Anion gap: 7 (ref 5–15)
BUN: 5 mg/dL — ABNORMAL LOW (ref 6–20)
CO2: 25 mmol/L (ref 22–32)
Calcium: 8.6 mg/dL — ABNORMAL LOW (ref 8.9–10.3)
Chloride: 111 mmol/L (ref 98–111)
Creatinine, Ser: 0.81 mg/dL (ref 0.61–1.24)
GFR, Estimated: >60 mL/min (ref >60)
Glucose, Bld: 111 mg/dL — ABNORMAL HIGH (ref 70–99)
Potassium: 4 mmol/L (ref 3.5–5.1)
Sodium: 143 mmol/L (ref 135–145)
Total Bilirubin: 0.6 mg/dL (ref 0.3–1.2)
Total Protein: 7 g/dL (ref 6.5–8.1)

## 2022-06-04 LAB — CBC WITH DIFFERENTIAL/PLATELET
Abs Immature Granulocytes: 0.07 10*3/uL (ref 0.00–0.07)
Basophils Absolute: 0.1 10*3/uL (ref 0.0–0.1)
Basophils Relative: 2 %
Eosinophils Absolute: 0.2 10*3/uL (ref 0.0–0.5)
Eosinophils Relative: 2 %
HCT: 40.3 % (ref 39.0–52.0)
Hemoglobin: 13.1 g/dL (ref 13.0–17.0)
Immature Granulocytes: 1 %
Lymphocytes Relative: 44 %
Lymphs Abs: 3 10*3/uL (ref 0.7–4.0)
MCH: 28.8 pg (ref 26.0–34.0)
MCHC: 32.5 g/dL (ref 30.0–36.0)
MCV: 88.6 fL (ref 80.0–100.0)
Monocytes Absolute: 0.6 10*3/uL (ref 0.1–1.0)
Monocytes Relative: 8 %
Neutro Abs: 3 10*3/uL (ref 1.7–7.7)
Neutrophils Relative %: 43 %
Platelets: 300 10*3/uL (ref 150–400)
RBC: 4.55 MIL/uL (ref 4.22–5.81)
RDW: 15 % (ref 11.5–15.5)
WBC: 6.9 10*3/uL (ref 4.0–10.5)
nRBC: 0 % (ref 0.0–0.2)

## 2022-06-04 LAB — TROPONIN I (HIGH SENSITIVITY): Troponin I (High Sensitivity): 6 ng/L (ref <18)

## 2022-06-04 LAB — BRAIN NATRIURETIC PEPTIDE: B Natriuretic Peptide: 32.3 pg/mL (ref 0.0–100.0)

## 2022-06-04 LAB — LIPASE, BLOOD: Lipase: 20 U/L (ref 11–51)

## 2022-06-04 NOTE — ED Triage Notes (Signed)
Pt states that his legs and feet are swelling, he is dehydrated, his pancreatitis is flaring up, he is vomiting, and it burns when he pees x 3 weeks.

## 2022-06-04 NOTE — ED Provider Triage Note (Signed)
Emergency Medicine Provider Triage Evaluation Note  Brent Rogers , a 56 y.o. male  was evaluated in triage.  Pt complains of complaints.  Patient states he has epigastric abdominal pain and has history of pancreatitis which feels similar.  He also notes he has had chest pain over the last 3 days which is nonexertional nonpleuritic in nature.  Also notes his bilateral lower extremities are swollen.  No history of PE or DVT.  Denies history of heart failure.  No PND orthopnea.  Review of Systems  Positive: Chest pain, abdominal pain, lower extremity swelling Negative: Fever, Numbness or weakness  Physical Exam  BP (!) 134/101 (BP Location: Left Arm)   Pulse 78   Temp 98.3 F (36.8 C) (Oral)   Resp 16   SpO2 99%  Gen:   Awake, no distress   Resp:  Normal effort  MSK:   Moves extremities without difficulty  Other:    Medical Decision Making  Medically screening exam initiated at 10:08 PM.  Appropriate orders placed.  ATHEL MERRIWEATHER was informed that the remainder of the evaluation will be completed by another provider, this initial triage assessment does not replace that evaluation, and the importance of remaining in the ED until their evaluation is complete.  CP, abd pain, LE swelling   Tenecia Ignasiak A, PA-C 06/04/22 2209

## 2022-06-05 LAB — TROPONIN I (HIGH SENSITIVITY): Troponin I (High Sensitivity): 6 ng/L (ref <18)

## 2022-06-05 MED ORDER — KETOROLAC TROMETHAMINE 60 MG/2ML IM SOLN
60.0000 mg | Freq: Once | INTRAMUSCULAR | Status: AC
  Administered 2022-06-05: 60 mg via INTRAMUSCULAR
  Filled 2022-06-05: qty 2

## 2022-06-05 MED ORDER — FUROSEMIDE 40 MG PO TABS
20.0000 mg | ORAL_TABLET | Freq: Once | ORAL | Status: AC
  Administered 2022-06-05: 20 mg via ORAL
  Filled 2022-06-05: qty 1

## 2022-06-05 MED ORDER — HALOPERIDOL LACTATE 5 MG/ML IJ SOLN
2.0000 mg | Freq: Once | INTRAMUSCULAR | Status: AC
  Administered 2022-06-05: 2 mg via INTRAMUSCULAR
  Filled 2022-06-05: qty 1

## 2022-06-05 NOTE — ED Provider Notes (Signed)
[facility] DEPT Provider Note   CSN: [id number] Arrival date & time: 06/04/22  2150     History  Chief Complaint  Patient presents with   Leg Swelling   Foot Swelling   Emesis    Brent Rogers is a 56 y.o. male.  The history is provided by the patient.  Illness Location:  Feet B swelling Quality:  Also abdominal pain epigastric that is ongoing Severity:  Moderate Onset quality:  Gradual Duration:  3 weeks Timing:  Constant Progression:  Unchanged Chronicity:  New Context:  H/o pancreatitis Relieved by:  Nothing Ineffective treatments:  None Associated symptoms: no chest pain, no congestion, no diarrhea, no fever and no shortness of breath   Patient with foot swelling and ongoing abdominal pain.  States he drinks alcohol but none today.      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 Authorizing Provider  lipase/protease/amylase (CREON) 36000 UNITS CPEP capsule Take 1 capsule (36,000 Units total) by mouth with breakfast, with lunch, and with evening meal. 01/10/22   Curatolo, Adam, DO  metoCLOPramide (REGLAN) 10 MG tablet Take 1 tablet (10 mg total) by mouth every 6 (six) hours as needed for nausea or vomiting. Patient not taking: Reported on 03/12/2022 02/03/22   Molpus, Jenny Reichmann, MD  ondansetron (ZOFRAN) 4 MG tablet Take 1 tablet (4 mg total) by mouth every 8 (eight) hours as needed for nausea or vomiting. 12/03/21   Dorothyann Peng, PA-C  pantoprazole (PROTONIX) 40 MG tablet Take 1 tablet (40 mg total) by mouth 2 (two) times daily. 01/10/22 03/13/23  Curatolo, Adam, DO  promethazine  (PHENERGAN) 25 MG tablet Take 1 tablet (25 mg total) by mouth once every 8 (eight) hours as needed for nausea or vomiting. 03/12/22   Quintella Reichert, MD      Allergies    Patient has no known allergies.    Review of Systems   Review of Systems  Constitutional:  Negative for fever.  HENT:  Negative for congestion.   Respiratory:  Negative for shortness of breath.   Cardiovascular:  Negative for chest pain.  Gastrointestinal:  Negative for diarrhea.  Genitourinary:  Negative for dysuria.  All other systems reviewed and are negative.   Physical Exam Updated Vital Signs BP 129/85   Pulse 60   Temp 98 F (36.7 C)   Resp 20   SpO2 100%  Physical Exam Vitals and nursing note reviewed.  Constitutional:      General: He is not in acute distress.    Appearance: He is well-developed. He is not diaphoretic.  HENT:     Head: Normocephalic and atraumatic.     Nose: Nose normal.  Eyes:     Conjunctiva/sclera: Conjunctivae normal.     Pupils: Pupils are equal, round, and reactive to light.  Cardiovascular:     Rate and Rhythm: Normal rate and regular rhythm.     Pulses: Normal pulses.     Heart sounds: Normal heart sounds.  Pulmonary:     Effort: Pulmonary effort is  normal.     Breath sounds: Normal breath sounds. No wheezing or rales.  Abdominal:     General: Abdomen is flat. Bowel sounds are normal.     Palpations: Abdomen is soft.     Tenderness: There is no abdominal tenderness. There is no guarding or rebound.  Musculoskeletal:        General: Normal range of motion.     Cervical back: Normal range of motion and neck supple.     Comments: Trace pedal edema  Skin:    General: Skin is warm and dry.  Neurological:     General: No focal deficit present.     Mental Status: He is alert and oriented to person, place, and time.     Deep Tendon Reflexes: Reflexes normal.  Psychiatric:        Mood and Affect: Mood normal.        Behavior: Behavior normal.     ED Results /  Procedures / Treatments   Labs (all labs ordered are listed, but only abnormal results are displayed) Results for orders placed or performed during the hospital encounter of 06/04/22  CBC with Differential  Result Value Ref Range   WBC 6.9 4.0 - 10.5 K/uL   RBC 4.55 4.22 - 5.81 MIL/uL   Hemoglobin 13.1 13.0 - 17.0 g/dL   HCT 40.3 39.0 - 52.0 %   MCV 88.6 80.0 - 100.0 fL   MCH 28.8 26.0 - 34.0 pg   MCHC 32.5 30.0 - 36.0 g/dL   RDW 15.0 11.5 - 15.5 %   Platelets 300 150 - 400 K/uL   nRBC 0.0 0.0 - 0.2 %   Neutrophils Relative % 43 %   Neutro Abs 3.0 1.7 - 7.7 K/uL   Lymphocytes Relative 44 %   Lymphs Abs 3.0 0.7 - 4.0 K/uL   Monocytes Relative 8 %   Monocytes Absolute 0.6 0.1 - 1.0 K/uL   Eosinophils Relative 2 %   Eosinophils Absolute 0.2 0.0 - 0.5 K/uL   Basophils Relative 2 %   Basophils Absolute 0.1 0.0 - 0.1 K/uL   Immature Granulocytes 1 %   Abs Immature Granulocytes 0.07 0.00 - 0.07 K/uL  Comprehensive metabolic panel  Result Value Ref Range   Sodium 143 135 - 145 mmol/L   Potassium 4.0 3.5 - 5.1 mmol/L   Chloride 111 98 - 111 mmol/L   CO2 25 22 - 32 mmol/L   Glucose, Bld 111 (H) 70 - 99 mg/dL   BUN 5 (L) 6 - 20 mg/dL   Creatinine, Ser 0.81 0.61 - 1.24 mg/dL   Calcium 8.6 (L) 8.9 - 10.3 mg/dL   Total Protein 7.0 6.5 - 8.1 g/dL   Albumin 3.2 (L) 3.5 - 5.0 g/dL   AST 91 (H) 15 - 41 U/L   ALT 34 0 - 44 U/L   Alkaline Phosphatase 167 (H) 38 - 126 U/L   Total Bilirubin 0.6 0.3 - 1.2 mg/dL   GFR, Estimated >60 >60 mL/min   Anion gap 7 5 - 15  Lipase, blood  Result Value Ref Range   Lipase 20 11 - 51 U/L  Brain natriuretic peptide  Result Value Ref Range   B Natriuretic Peptide 32.3 0.0 - 100.0 pg/mL  Troponin I (High Sensitivity)  Result Value Ref Range   Troponin I (High Sensitivity) 6 <18 ng/L  Troponin I (High Sensitivity)  Result Value Ref Range   Troponin I (High Sensitivity) 6 <18 ng/L   DG Chest  2 View  Result Date: 06/04/2022 CLINICAL DATA:  cp EXAM:  CHEST - 2 VIEW COMPARISON:  Chest x-ray 06/01/2022, chest x-ray 03/12/2021 FINDINGS: The heart and mediastinal contours are within normal limits. Stable chronic airspace opacity overlying the posterior lower lobes on lateral view. No focal consolidation. No pulmonary edema. No pleural effusion. No pneumothorax. No acute osseous abnormality. IMPRESSION: No active cardiopulmonary disease. Electronically Signed   By: Iven Finn M.D.   On: 06/04/2022 22:34   DG Chest 2 View  Result Date: 06/01/2022 CLINICAL DATA:  Shortness of breath.  Lower extremity edema. EXAM: CHEST - 2 VIEW COMPARISON:  Radiograph 4 days ago 05/29/2022 FINDINGS: The heart is normal in size.The cardiomediastinal contours are normal. The lungs are clear. Pulmonary vasculature is normal. No consolidation, pleural effusion, or pneumothorax. No acute osseous abnormalities are seen. IMPRESSION: Negative radiographs of the chest. Electronically Signed   By: Keith Rake M.D.   On: 06/01/2022 23:28   DG Chest 2 View  Result Date: 05/29/2022 CLINICAL DATA:  Ankle swelling EXAM: CHEST - 2 VIEW COMPARISON:  03/12/2022 FINDINGS: The heart size and mediastinal contours are within normal limits. Both lungs are clear. The visualized skeletal structures are unremarkable. IMPRESSION: No active cardiopulmonary disease. Electronically Signed   By: Donavan Foil M.D.   On: 05/29/2022 22:13     None  Radiology DG Chest 2 View  Result Date: 06/04/2022 CLINICAL DATA:  cp EXAM: CHEST - 2 VIEW COMPARISON:  Chest x-ray 06/01/2022, chest x-ray 03/12/2021 FINDINGS: The heart and mediastinal contours are within normal limits. Stable chronic airspace opacity overlying the posterior lower lobes on lateral view. No focal consolidation. No pulmonary edema. No pleural effusion. No pneumothorax. No acute osseous abnormality. IMPRESSION: No active cardiopulmonary disease. Electronically Signed   By: Iven Finn M.D.   On: 06/04/2022 22:34     Procedures Procedures    Medications Ordered in ED Medications  furosemide (LASIX) tablet 20 mg (20 mg Oral Given 06/05/22 0143)  ketorolac (TORADOL) injection 60 mg (60 mg Intramuscular Given 06/05/22 0144)  haloperidol lactate (HALDOL) injection 2 mg (2 mg Intramuscular Given 06/05/22 0144)    ED Course/ Medical Decision Making/ A&P                           Medical Decision Making Feet swelling and abdominal pain  Amount and/or Complexity of Data Reviewed External Data Reviewed: notes.    Details: Previous notes reviewed  Labs: ordered.    Details: All labs reviewed:  normal lipase.  Normal white count 6.9 normal hemoglobin 13.1 normal platelets.  Normal sodium and potassium normal BUN5 and creatinine .81 AST 91  > 3 x ALT consistent with alcohol pattern. 3 normal  Radiology: ordered.    Details: Negative CXR by me  ECG/medicine tests: ordered and independent interpretation performed. Decision-making details documented in ED Course.  Risk Prescription drug management. Risk Details: Patient with trace pedal edema on exam.  Treated with lasix.  Patient is resting comfortably and there are no non verbal cues of pain.  Treated in the ED.  No indication for repeat imaging at this time.  Stable for discharge.  Strict return    Final Clinical Impression(s) / ED Diagnoses Final diagnoses:  Pedal edema  Abdominal pain, unspecified abdominal location   Return for intractable cough, coughing up blood, fevers > 100.4 unrelieved by medication, shortness of breath, intractable vomiting, chest pain, shortness of breath, weakness, numbness, changes in speech,  facial asymmetry, abdominal pain, passing out, Inability to tolerate liquids or food, cough, altered mental status or any concerns. No signs of systemic illness or infection. The patient is nontoxic-appearing on exam and vital signs are within normal limits.  I have reviewed the triage vital signs and the nursing notes. Pertinent labs &  imaging results that were available during my care of the patient were reviewed by me and considered in my medical decision making (see chart for details). After history, exam, and medical workup I feel the patient has been appropriately medically screened and is safe for discharge home. Pertinent diagnoses were discussed with the patient. Patient was given return precautions.  Rx / DC Orders ED Discharge Orders     None         Neil Brickell, MD 06/05/22 [phone]

## 2022-09-16 ENCOUNTER — Other Ambulatory Visit: Payer: Self-pay

## 2022-11-28 ENCOUNTER — Other Ambulatory Visit: Payer: Self-pay

## 2022-11-28 ENCOUNTER — Emergency Department (HOSPITAL_COMMUNITY)
Admission: EM | Admit: 2022-11-28 | Discharge: 2022-11-29 | Disposition: A | Discharge status: Home / Self Care | Payer: Medicaid Other | Attending: Emergency Medicine | Admitting: Emergency Medicine

## 2022-11-28 DIAGNOSIS — R7401 Elevation of levels of liver transaminase levels: Secondary | ICD-10-CM | POA: Diagnosis not present

## 2022-11-28 DIAGNOSIS — I10 Essential (primary) hypertension: Secondary | ICD-10-CM | POA: Insufficient documentation

## 2022-11-28 DIAGNOSIS — R1084 Generalized abdominal pain: Secondary | ICD-10-CM | POA: Diagnosis not present

## 2022-11-28 DIAGNOSIS — R112 Nausea with vomiting, unspecified: Secondary | ICD-10-CM

## 2022-11-28 DIAGNOSIS — R1013 Epigastric pain: Secondary | ICD-10-CM | POA: Diagnosis present

## 2022-11-28 NOTE — ED Triage Notes (Signed)
Pt bib gems from home. Pt c/o abdominal/flank pain for the past 5 days. Hx pancreatitis. Pt c/o N+V. Pt reports this pain being a flare up. Last flare up July 2023.  18RR 118 palp 98HR  [id number]

## 2022-11-29 ENCOUNTER — Emergency Department (HOSPITAL_COMMUNITY): Payer: Medicaid Other

## 2022-11-29 LAB — COMPREHENSIVE METABOLIC PANEL
ALT: 26 U/L (ref 0–44)
AST: 52 U/L — ABNORMAL HIGH (ref 15–41)
Albumin: 3.5 g/dL (ref 3.5–5.0)
Alkaline Phosphatase: 310 U/L — ABNORMAL HIGH (ref 38–126)
Anion gap: 12 (ref 5–15)
BUN: <5 mg/dL — ABNORMAL LOW (ref 6–20)
CO2: 24 mmol/L (ref 22–32)
Calcium: 8.7 mg/dL — ABNORMAL LOW (ref 8.9–10.3)
Chloride: 99 mmol/L (ref 98–111)
Creatinine, Ser: 0.72 mg/dL (ref 0.61–1.24)
GFR, Estimated: >60 mL/min (ref >60)
Glucose, Bld: 117 mg/dL — ABNORMAL HIGH (ref 70–99)
Potassium: 4.1 mmol/L (ref 3.5–5.1)
Sodium: 135 mmol/L (ref 135–145)
Total Bilirubin: 0.6 mg/dL (ref 0.3–1.2)
Total Protein: 7.3 g/dL (ref 6.5–8.1)

## 2022-11-29 LAB — CBC
HCT: 49.1 % (ref 39.0–52.0)
Hemoglobin: 16.4 g/dL (ref 13.0–17.0)
MCH: 28.5 pg (ref 26.0–34.0)
MCHC: 33.4 g/dL (ref 30.0–36.0)
MCV: 85.2 fL (ref 80.0–100.0)
Platelets: 293 10*3/uL (ref 150–400)
RBC: 5.76 MIL/uL (ref 4.22–5.81)
RDW: 14.9 % (ref 11.5–15.5)
WBC: 6.8 10*3/uL (ref 4.0–10.5)
nRBC: 0 % (ref 0.0–0.2)

## 2022-11-29 LAB — LIPASE, BLOOD: Lipase: 26 U/L (ref 11–51)

## 2022-11-29 MED ORDER — DIPHENHYDRAMINE HCL 25 MG PO CAPS
25.0000 mg | ORAL_CAPSULE | Freq: Once | ORAL | Status: AC
  Administered 2022-11-29: 25 mg via ORAL
  Filled 2022-11-29: qty 1

## 2022-11-29 MED ORDER — IOHEXOL 350 MG/ML SOLN: 100.0000 mL | Freq: Once | INTRAVENOUS | Status: DC | PRN

## 2022-11-29 MED ORDER — DICYCLOMINE HCL 20 MG PO TABS: 20.0000 mg | ORAL_TABLET | Freq: Two times a day (BID) | ORAL | 0 refills | Status: DC

## 2022-11-29 MED ORDER — LACTATED RINGERS IV BOLUS
500.0000 mL | Freq: Once | INTRAVENOUS | Status: AC
  Administered 2022-11-29: 500 mL via INTRAVENOUS

## 2022-11-29 MED ORDER — LACTATED RINGERS IV BOLUS
1000.0000 mL | Freq: Once | INTRAVENOUS | Status: AC
  Administered 2022-11-29: 1000 mL via INTRAVENOUS

## 2022-11-29 MED ORDER — IOHEXOL 300 MG/ML  SOLN
100.0000 mL | Freq: Once | INTRAMUSCULAR | Status: AC | PRN
  Administered 2022-11-29: 100 mL via INTRAVENOUS

## 2022-11-29 MED ORDER — METOCLOPRAMIDE HCL 5 MG/ML IJ SOLN
10.0000 mg | Freq: Once | INTRAMUSCULAR | Status: AC
Start: 2022-11-29 — End: 2022-11-29
  Administered 2022-11-29: 10 mg via INTRAVENOUS
  Filled 2022-11-29: qty 2

## 2022-11-29 MED ORDER — ONDANSETRON HCL 4 MG/2ML IJ SOLN
4.0000 mg | Freq: Once | INTRAMUSCULAR | Status: AC
Start: 2022-11-29 — End: 2022-11-29
  Administered 2022-11-29: 4 mg via INTRAVENOUS
  Filled 2022-11-29: qty 2

## 2022-11-29 MED ORDER — FAMOTIDINE IN NACL 20-0.9 MG/50ML-% IV SOLN
20.0000 mg | Freq: Once | INTRAVENOUS | Status: AC
  Administered 2022-11-29: 20 mg via INTRAVENOUS
  Filled 2022-11-29: qty 50

## 2022-11-29 MED ORDER — SODIUM CHLORIDE (PF) 0.9 % IJ SOLN
INTRAMUSCULAR | Status: AC
  Filled 2022-11-29: qty 50

## 2022-11-29 MED ORDER — PROMETHAZINE HCL 25 MG PO TABS: 25.0000 mg | ORAL_TABLET | Freq: Four times a day (QID) | ORAL | 0 refills | Status: DC | PRN

## 2022-11-29 MED ORDER — MORPHINE SULFATE (PF) 4 MG/ML IV SOLN
4.0000 mg | Freq: Once | INTRAVENOUS | Status: AC
  Administered 2022-11-29: 4 mg via INTRAVENOUS
  Filled 2022-11-29: qty 1

## 2022-11-29 MED ORDER — DICYCLOMINE HCL 10 MG PO CAPS
20.0000 mg | ORAL_CAPSULE | Freq: Once | ORAL | Status: AC
  Administered 2022-11-29: 20 mg via ORAL
  Filled 2022-11-29: qty 2

## 2022-11-29 NOTE — Discharge Instructions (Signed)
Hydrate, refrain from any alcohol.  Take Phenergan as needed for nausea, Bentyl twice daily for pain.  Hydrate and follow-up with a gastroenterologist I given you the information for an office.

## 2022-11-29 NOTE — ED Provider Notes (Signed)
[facility] Provider Note   CSN: [id number] Arrival date & time: 11/28/22  2259     History  Chief Complaint  Patient presents with   Abdominal Pain    [name] is a 57 y.o. male.   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  He states that his symptoms feel very consistent with his chronic pancreatitis pain.  He states that his emesis has been nonbloody nonbilious.  He states that his pain is epigastric and severe.  Denies any dark or tarry looking stool.  No chest pain difficulty breathing no lightheadedness dizziness syncope or near syncope.  Denies any recreational drug use or alcohol use.  No fevers chills no urinary frequency urgency dysuria or hematuria.    Home Medications Prior to Admission medications   Medication Sig Start Date End Date Taking? Authorizing Provider  acetaminophen (TYLENOL) 500 MG tablet Take 500 mg by mouth every 6 (six) hours as needed for moderate pain.   Yes [provider]  dicyclomine (BENTYL) 20 MG tablet Take 1 tablet (20 mg total) by mouth 2 (two) times daily. 11/29/22  Yes Amayrani Bennick S, PA  promethazine (PHENERGAN) 25 MG tablet Take 1 tablet (25 mg total) by mouth every 6 (six) hours as needed for nausea or vomiting. 11/29/22  Yes Tedd Sias, PA  lipase/protease/amylase (CREON) 36000 UNITS CPEP capsule Take 1 capsule (36,000 Units total) by mouth with breakfast, with lunch, and with evening meal. Patient not taking: Reported on 11/29/2022 01/10/22   Lennice Sites, DO  metoCLOPramide (REGLAN) 10 MG tablet Take 1 tablet (10 mg total) by mouth every 6  (six) hours as needed for nausea or vomiting. Patient not taking: Reported on 03/12/2022 02/03/22   Molpus, Jenny Reichmann, MD  ondansetron (ZOFRAN) 4 MG tablet Take 1 tablet (4 mg total) by mouth every 8 (eight) hours as needed for nausea or vomiting. Patient not taking: Reported on 11/29/2022 12/03/21   Dorothyann Peng, PA-C  pantoprazole (PROTONIX) 40 MG tablet Take 1 tablet (40 mg total) by mouth 2 (two) times daily. Patient not taking: Reported on 11/29/2022 01/10/22 03/13/23  Lennice Sites, DO      Allergies    Patient has no known allergies.    Review of Systems   Review of Systems  Gastrointestinal:  Positive for abdominal pain.    Physical Exam Updated Vital Signs BP (!) 118/92   Pulse 68   Temp 98.1 F (36.7 C) (Oral)   Resp 18   Ht '6\' 3"'$  (1.905 m)   Wt 68 kg   SpO2 98%   BMI 18.75 kg/m  Physical Exam Vitals and nursing note reviewed.  Constitutional:      General: He is not in acute distress. HENT:     Head: Normocephalic and atraumatic.     Nose: Nose normal.     Mouth/Throat:     Mouth: Mucous membranes are dry.  Eyes:     General: No scleral icterus. Cardiovascular:     Rate and Rhythm: Normal rate and regular rhythm.     Pulses: Normal pulses.     Heart sounds: Normal heart sounds.  Pulmonary:  Effort: Pulmonary effort is normal. No respiratory distress.     Breath sounds: Normal breath sounds. No wheezing.  Abdominal:     Palpations: Abdomen is soft.     Tenderness: There is abdominal tenderness. There is no guarding or rebound.     Comments: Diffuse abdominal tenderness with palpation.  No guarding or rebound.  Suggested to be some focal tenderness of the right lower quadrant.  Musculoskeletal:     Cervical back: Normal range of motion.     Right lower leg: No edema.     Left lower leg: No edema.  Skin:    General: Skin is warm and dry.     Capillary Refill: Capillary refill takes less than 2 seconds.  Neurological:     Mental Status: He is alert. Mental  status is at baseline.  Psychiatric:        Mood and Affect: Mood normal.        Behavior: Behavior normal.     ED Results / Procedures / Treatments   Labs (all labs ordered are listed, but only abnormal results are displayed) Labs Reviewed  COMPREHENSIVE METABOLIC PANEL - Abnormal; Notable for the following components:      Result Value   Glucose, Bld 117 (*)    BUN <5 (*)    Calcium 8.7 (*)    AST 52 (*)    Alkaline Phosphatase 310 (*)    All other components within normal limits  LIPASE, BLOOD  CBC    EKG None  Radiology CT ABDOMEN PELVIS W CONTRAST  Result Date: 11/29/2022 CLINICAL DATA:  Epigastric pain with diffuse tenderness to palpation. EXAM: CT ABDOMEN AND PELVIS WITH CONTRAST TECHNIQUE: Multidetector CT imaging of the abdomen and pelvis was performed using the standard protocol following bolus administration of intravenous contrast. RADIATION DOSE REDUCTION: This exam was performed according to the departmental dose-optimization program which includes automated exposure control, adjustment of the mA and/or kV according to patient size and/or use of iterative reconstruction technique. CONTRAST:  175m OMNIPAQUE IOHEXOL 300 MG/ML  SOLN COMPARISON:  04/15/2022. FINDINGS: Lower chest: No acute abnormality. Hepatobiliary: No focal liver abnormality is seen. There is fatty infiltration of the liver. Pneumobilia is noted. The gallbladder is surgically absent. Pancreas: Status post Whipple procedure with a small amount of air in the pancreatic duct. No pancreatic ductal dilatation or surrounding inflammatory changes. Spleen: Normal in size without focal abnormality. Adrenals/Urinary Tract: The adrenal glands are within normal limits. The kidneys enhance symmetrically. Scattered subcentimeter hypodensities are present in the kidneys bilaterally which are too small to further characterize. No renal calculus or hydronephrosis. The bladder is unremarkable. Stomach/Bowel: Gastric surgery  changes are noted. Appendix is not seen. There is thickening of the walls of the distal transverse colon at the splenic flexure. No bowel obstruction, free air or pneumatosis. Vascular/Lymphatic: Aortic atherosclerosis. No enlarged abdominal or pelvic lymph nodes. Reproductive: Prostate is unremarkable. Other: No abdominopelvic ascites. Musculoskeletal: Degenerative changes in the thoracolumbar spine. There is a compression deformity in the inferior endplate at T624THLwhich is new from the previous exam. IMPRESSION: 1. Colonic wall thickening at the splenic flexure, suggesting colitis. 2. Hepatic steatosis and pneumobilia. 3. Aortic atherosclerosis. 4. Compression deformity in the inferior endplate at T624THLwhich is new from the previous exam. Electronically Signed   By: LBrett FairyM.D.   On: 11/29/2022 05:00    Procedures Procedures    Medications Ordered in ED Medications  metoCLOPramide (REGLAN) injection 10 mg (10 mg Intravenous Given 11/29/22  [id number])  diphenhydrAMINE (BENADRYL) capsule 25 mg (25 mg Oral Given 11/29/22 0055)  lactated ringers bolus 1,000 mL (0 mLs Intravenous Stopped 11/29/22 0327)  famotidine (PEPCID) IVPB 20 mg premix (0 mg Intravenous Stopped 11/29/22 0148)  dicyclomine (BENTYL) capsule 20 mg (20 mg Oral Given 11/29/22 0055)  morphine (PF) 4 MG/ML injection 4 mg (4 mg Intravenous Given 11/29/22 0325)  lactated ringers bolus 500 mL (0 mLs Intravenous Stopped 11/29/22 0835)  ondansetron (ZOFRAN) injection 4 mg (4 mg Intravenous Given 11/29/22 0324)  iohexol (OMNIPAQUE) 300 MG/ML solution 100 mL (100 mLs Intravenous Contrast Given 11/29/22 0444)    ED Course/ Medical Decision Making/ A&P                             Medical Decision Making Amount and/or Complexity of Data Reviewed Labs: ordered. Radiology: ordered.  Risk Prescription drug management.   This patient presents to the ED for concern of abd pain NV, this involves a number of treatment options, and is a complaint that carries  with it a high risk of complications and morbidity. A differential diagnosis was considered for the patient's symptoms which is discussed below:   The causes of generalized abdominal pain include but are not limited to AAA, mesenteric ischemia, appendicitis, diverticulitis, DKA, gastritis, gastroenteritis, AMI, nephrolithiasis, pancreatitis, peritonitis, adrenal insufficiency,lead poisoning, iron toxicity, intestinal ischemia, constipation, UTI,SBO/LBO, splenic rupture, biliary disease, IBD, IBS, PUD, or hepatitis.   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  He states that his symptoms feel very consistent with his chronic pancreatitis pain.  He states that his emesis has been nonbloody nonbilious.  He states that his pain is epigastric and severe.  Denies any dark or tarry looking stool.  No chest pain difficulty breathing no lightheadedness dizziness syncope or near syncope.  Denies any recreational drug use or alcohol use.  No fevers chills no urinary frequency urgency dysuria or hematuria.    EMR reviewed including pt PMHx, past surgical history and past visits to ER.   See HPI for more details   Lab Tests:   I ordered and independently interpreted labs. Labs notable for Elevated alkaline phosphatase at 310 somewhat higher than prior.  Mild transaminitis consistent with chronic pancreatitis.  2:1 ratio noted between AST and ALT.  Lipase within normal limits this does not exclude chronic pancreatitis.  CBC without leukocytosis or anemia   Imaging Studies:  Abnormal findings. I personally reviewed all imaging studies.  Imaging notable for Some possible colonic wall thickening.  Compression deformity at inferior endplate of 624THL.  Patient not having any back pain IMPRESSION:  1. Colonic wall thickening at the splenic flexure, suggesting  colitis.  2. Hepatic steatosis and pneumobilia.  3. Aortic atherosclerosis.  4. Compression deformity in the inferior endplate at 624THL which is  new from the previous exam.      Electronically Signed    By: Brett Fairy M.D.    On: 11/29/2022 05:00    Cardiac Monitoring:  The patient was maintained on a cardiac monitor.  I personally viewed and interpreted the cardiac monitored which showed an underlying rhythm  of: NSR EKG non-ischemic   Medicines ordered:  I ordered medication including 1.5L of LR, morphine, pepcid, bentyl, reglan/benadryl for nausea/vomiting and abd pain.  Reevaluation of the patient after these medicines showed that the patient improved I have reviewed the patients home medicines and have made adjustments as needed   Critical Interventions:     Consults/Attending Physician      Reevaluation:  After the interventions noted above I re-evaluated patient and found that they have :improved   Social Determinants of Health:      Problem List / ED Course:  Nausea, vomiting abdominal pain. Sx improved now. Well appearing. VS normal.  Feels much improved now.  CT without any acute findings apart from proximal.  Colitis which is consistent with patient history clinical states he has had some diarrhea.  Well-appearing discharge home with follow-up with his care doctor.  Precautions discussed patient is tolerating p.o. ambulatory, discharge.   Dispostion:  After consideration of the diagnostic results and the patients response to treatment, I feel that the patent would benefit from close outpatient follow up.  Return precautions given.    Final Clinical Impression(s) / ED Diagnoses Final diagnoses:  Generalized abdominal pain   Nausea and vomiting, unspecified vomiting type    Rx / DC Orders ED Discharge Orders          Ordered    dicyclomine (BENTYL) 20 MG tablet  2 times daily        11/29/22 0711    promethazine (PHENERGAN) 25 MG tablet  Every 6 hours PRN        11/29/22 0711              Tedd Sias, PA 11/29/22 2231    Quintella Reichert, MD 11/29/22 2238

## 2022-12-04 ENCOUNTER — Telehealth: Payer: Self-pay

## 2022-12-04 NOTE — Patient Outreach (Signed)
  Care Coordination   12/04/2022 Name: [name] MRN: [id number] DOB: [birth year]   Care Coordination Outreach Attempts:  An unsuccessful telephone outreach was attempted today to offer the patient information about available care coordination services as a benefit of their health plan.   Follow Up Plan:  Additional outreach attempts will be made to offer the patient care coordination information and services.   Encounter Outcome:  No Answer   Care Coordination Interventions:  No, not indicated      Enzo Montgomery, Brent Rogers,Brent Rogers,Brent Rogers [facility] Care Management Care Management Community Coordinator Direct Phone: [phone] Toll Free: [phone] Fax: [phone]

## 2022-12-11 ENCOUNTER — Other Ambulatory Visit: Payer: Self-pay

## 2022-12-11 ENCOUNTER — Encounter (HOSPITAL_COMMUNITY): Payer: Self-pay | Admitting: Emergency Medicine

## 2022-12-11 ENCOUNTER — Emergency Department (HOSPITAL_COMMUNITY)
Admission: EM | Admit: 2022-12-11 | Discharge: 2022-12-11 | Discharge status: Against Medical Advice | Payer: Medicaid Other | Attending: Emergency Medicine | Admitting: Emergency Medicine

## 2022-12-11 ENCOUNTER — Emergency Department (HOSPITAL_COMMUNITY)
Admission: EM | Admit: 2022-12-11 | Discharge: 2022-12-11 | Disposition: A | Discharge status: Home / Self Care | Payer: Medicaid Other | Source: Home / Self Care | Attending: Emergency Medicine | Admitting: Emergency Medicine

## 2022-12-11 ENCOUNTER — Encounter (HOSPITAL_COMMUNITY): Payer: Self-pay

## 2022-12-11 DIAGNOSIS — Y900 Blood alcohol level of less than 20 mg/100 ml: Secondary | ICD-10-CM | POA: Insufficient documentation

## 2022-12-11 DIAGNOSIS — R109 Unspecified abdominal pain: Secondary | ICD-10-CM | POA: Insufficient documentation

## 2022-12-11 DIAGNOSIS — R1110 Vomiting, unspecified: Secondary | ICD-10-CM | POA: Diagnosis not present

## 2022-12-11 DIAGNOSIS — G8929 Other chronic pain: Secondary | ICD-10-CM

## 2022-12-11 DIAGNOSIS — R1084 Generalized abdominal pain: Secondary | ICD-10-CM | POA: Insufficient documentation

## 2022-12-11 DIAGNOSIS — Z5321 Procedure and treatment not carried out due to patient leaving prior to being seen by health care provider: Secondary | ICD-10-CM | POA: Diagnosis not present

## 2022-12-11 DIAGNOSIS — Z765 Malingerer [conscious simulation]: Secondary | ICD-10-CM

## 2022-12-11 LAB — CBC WITH DIFFERENTIAL/PLATELET
Abs Immature Granulocytes: 0.04 10*3/uL (ref 0.00–0.07)
Basophils Absolute: 0.1 10*3/uL (ref 0.0–0.1)
Basophils Relative: 1 %
Eosinophils Absolute: 0.1 10*3/uL (ref 0.0–0.5)
Eosinophils Relative: 1 %
HCT: 43.7 % (ref 39.0–52.0)
Hemoglobin: 14.1 g/dL (ref 13.0–17.0)
Immature Granulocytes: 1 %
Lymphocytes Relative: 16 %
Lymphs Abs: 1.4 10*3/uL (ref 0.7–4.0)
MCH: 28.2 pg (ref 26.0–34.0)
MCHC: 32.3 g/dL (ref 30.0–36.0)
MCV: 87.4 fL (ref 80.0–100.0)
Monocytes Absolute: 1.2 10*3/uL — ABNORMAL HIGH (ref 0.1–1.0)
Monocytes Relative: 14 %
Neutro Abs: 6 10*3/uL (ref 1.7–7.7)
Neutrophils Relative %: 67 %
Platelets: 176 10*3/uL (ref 150–400)
RBC: 5 MIL/uL (ref 4.22–5.81)
RDW: 15.7 % — ABNORMAL HIGH (ref 11.5–15.5)
WBC: 8.7 10*3/uL (ref 4.0–10.5)
nRBC: 0 % (ref 0.0–0.2)

## 2022-12-11 LAB — URINALYSIS, ROUTINE W REFLEX MICROSCOPIC
Bilirubin Urine: NEGATIVE
Glucose, UA: NEGATIVE mg/dL
Hgb urine dipstick: NEGATIVE
Ketones, ur: NEGATIVE mg/dL
Leukocytes,Ua: NEGATIVE
Nitrite: NEGATIVE
Protein, ur: NEGATIVE mg/dL
Specific Gravity, Urine: 1.003 — ABNORMAL LOW (ref 1.005–1.030)
pH: 6 (ref 5.0–8.0)

## 2022-12-11 LAB — COMPREHENSIVE METABOLIC PANEL
ALT: 27 U/L (ref 0–44)
ALT: 32 U/L (ref 0–44)
AST: 64 U/L — ABNORMAL HIGH (ref 15–41)
AST: 74 U/L — ABNORMAL HIGH (ref 15–41)
Albumin: 3 g/dL — ABNORMAL LOW (ref 3.5–5.0)
Albumin: 3.3 g/dL — ABNORMAL LOW (ref 3.5–5.0)
Alkaline Phosphatase: 252 U/L — ABNORMAL HIGH (ref 38–126)
Alkaline Phosphatase: 246 U/L — ABNORMAL HIGH (ref 38–126)
Anion gap: 10 (ref 5–15)
Anion gap: 12 (ref 5–15)
BUN: 7 mg/dL (ref 6–20)
BUN: 9 mg/dL (ref 6–20)
CO2: 20 mmol/L — ABNORMAL LOW (ref 22–32)
CO2: 21 mmol/L — ABNORMAL LOW (ref 22–32)
Calcium: 8.6 mg/dL — ABNORMAL LOW (ref 8.9–10.3)
Calcium: 8.7 mg/dL — ABNORMAL LOW (ref 8.9–10.3)
Chloride: 101 mmol/L (ref 98–111)
Chloride: 97 mmol/L — ABNORMAL LOW (ref 98–111)
Creatinine, Ser: 0.82 mg/dL (ref 0.61–1.24)
Creatinine, Ser: 0.8 mg/dL (ref 0.61–1.24)
GFR, Estimated: >60 mL/min (ref >60)
GFR, Estimated: >60 mL/min (ref >60)
Glucose, Bld: 135 mg/dL — ABNORMAL HIGH (ref 70–99)
Glucose, Bld: 121 mg/dL — ABNORMAL HIGH (ref 70–99)
Potassium: 4.1 mmol/L (ref 3.5–5.1)
Potassium: 4.6 mmol/L (ref 3.5–5.1)
Sodium: 131 mmol/L — ABNORMAL LOW (ref 135–145)
Sodium: 130 mmol/L — ABNORMAL LOW (ref 135–145)
Total Bilirubin: 0.8 mg/dL (ref 0.3–1.2)
Total Bilirubin: 1.2 mg/dL (ref 0.3–1.2)
Total Protein: 6.1 g/dL — ABNORMAL LOW (ref 6.5–8.1)
Total Protein: 6.6 g/dL (ref 6.5–8.1)

## 2022-12-11 LAB — RAPID URINE DRUG SCREEN, HOSP PERFORMED
Amphetamines: NOT DETECTED
Barbiturates: NOT DETECTED
Benzodiazepines: NOT DETECTED
Cocaine: NOT DETECTED
Opiates: NOT DETECTED
Tetrahydrocannabinol: NOT DETECTED

## 2022-12-11 LAB — CBC
HCT: 48 % (ref 39.0–52.0)
Hemoglobin: 15 g/dL (ref 13.0–17.0)
MCH: 29 pg (ref 26.0–34.0)
MCHC: 31.3 g/dL (ref 30.0–36.0)
MCV: 92.7 fL (ref 80.0–100.0)
Platelets: 117 10*3/uL — ABNORMAL LOW (ref 150–400)
RBC: 5.18 MIL/uL (ref 4.22–5.81)
RDW: 16.2 % — ABNORMAL HIGH (ref 11.5–15.5)
WBC: 8.1 10*3/uL (ref 4.0–10.5)
nRBC: 0 % (ref 0.0–0.2)

## 2022-12-11 LAB — ETHANOL: Alcohol, Ethyl (B): <10 mg/dL (ref <10)

## 2022-12-11 LAB — LIPASE, BLOOD
Lipase: 20 U/L (ref 11–51)
Lipase: 20 U/L (ref 11–51)

## 2022-12-11 MED ORDER — HYDROMORPHONE HCL 1 MG/ML IJ SOLN
0.5000 mg | INTRAMUSCULAR | Status: DC | PRN
  Administered 2022-12-11: 0.5 mg via INTRAVENOUS
  Filled 2022-12-11: qty 1

## 2022-12-11 MED ORDER — HYDROMORPHONE HCL 1 MG/ML IJ SOLN: 1.0000 mg | Freq: Once | INTRAMUSCULAR | Status: DC

## 2022-12-11 MED ORDER — DROPERIDOL 2.5 MG/ML IJ SOLN
1.2500 mg | Freq: Once | INTRAMUSCULAR | Status: AC
  Administered 2022-12-11: 1.25 mg via INTRAVENOUS
  Filled 2022-12-11: qty 2

## 2022-12-11 MED ORDER — LACTATED RINGERS IV BOLUS
2000.0000 mL | Freq: Once | INTRAVENOUS | Status: AC
  Administered 2022-12-11: 2000 mL via INTRAVENOUS

## 2022-12-11 MED ORDER — ONDANSETRON 4 MG PO TBDP
8.0000 mg | ORAL_TABLET | Freq: Once | ORAL | Status: AC
  Administered 2022-12-11: 8 mg via ORAL
  Filled 2022-12-11: qty 2

## 2022-12-11 MED ORDER — ONDANSETRON 4 MG PO TBDP: 4.0000 mg | ORAL_TABLET | Freq: Once | ORAL | Status: DC

## 2022-12-11 MED ORDER — OXYCODONE-ACETAMINOPHEN 5-325 MG PO TABS: 1.0000 | ORAL_TABLET | Freq: Once | ORAL | Status: DC

## 2022-12-11 MED ORDER — HYDROMORPHONE HCL 1 MG/ML IJ SOLN: 0.5000 mg | Freq: Once | INTRAMUSCULAR | Status: DC

## 2022-12-11 MED ORDER — HYDROCODONE-ACETAMINOPHEN 5-325 MG PO TABS
1.0000 | ORAL_TABLET | Freq: Once | ORAL | Status: AC
  Administered 2022-12-11: 1 via ORAL
  Filled 2022-12-11: qty 1

## 2022-12-11 NOTE — ED Provider Triage Note (Signed)
Emergency Medicine Provider Triage Evaluation Note  Brent Rogers , a 57 y.o. male  was evaluated in triage.  Pt complains of vomiting with left side abd pain, hx pancreatitis, seen at [facility] for same 2 weeks ago. Unable to keep any medications down, does not have zofran ODT. No fevers.  Chronic pancreatis with hx whipple in 2015.  Review of Systems  Positive:  Negative:   Physical Exam  BP (!) 122/97   Pulse 92   Temp 98.4 F (36.9 C)   Resp 18   Ht '6\' 3"'$  (1.905 m)   Wt 68 kg   SpO2 98%   BMI 18.75 kg/m  Gen:   Awake, no distress   Resp:  Normal effort  MSK:   Moves extremities without difficulty  Other:    Medical Decision Making  Medically screening exam initiated at 12:36 AM.  Appropriate orders placed.  Brent Rogers was informed that the remainder of the evaluation will be completed by another provider, this initial triage assessment does not replace that evaluation, and the importance of remaining in the ED until their evaluation is complete.     Tacy Learn, PA-C 12/11/22 [phone]

## 2022-12-11 NOTE — ED Triage Notes (Signed)
Pt BIB EMS with c/o abdominal pain x 5 days, states that he thinks that his pancreatitis is flaring up. Had 1 beer yesterday.

## 2022-12-11 NOTE — ED Notes (Signed)
Patient left without being seen, states he has "waited long enough."

## 2022-12-11 NOTE — ED Provider Notes (Addendum)
[place] EMERGENCY DEPARTMENT AT [facility] Provider Note   CSN: [id number] Arrival date & time: 12/11/22  [id number]     History  Chief Complaint  Patient presents with   Abdominal Pain    Brent Rogers is a 57 y.o. male.   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  Patient presented emergency room today with complaints of several weeks of abdominal pain.  He states that his symptoms are very consistent with chest pain.  He states that he was seen 2 weeks ago for similar symptoms in the emergency room.  It was actually resolved at that time.  He states his symptoms have continued since that time.  No new symptoms.       Home Medications Prior to Admission medications   Medication Sig Start Date End Date Taking? Authorizing Provider  acetaminophen (TYLENOL) 500 MG tablet Take 500 mg by mouth every 6 (six) hours as needed for moderate pain.    [provider]  dicyclomine (BENTYL) 20 MG tablet Take 1 tablet (20 mg total) by mouth 2 (two) times daily. 11/29/22   Tedd Sias, PA  lipase/protease/amylase (CREON) 36000 UNITS CPEP capsule Take 1 capsule (36,000 Units total) by mouth with breakfast, with lunch, and with evening meal. Patient not taking: Reported on 11/29/2022 01/10/22   Lennice Sites, DO  metoCLOPramide (REGLAN) 10 MG tablet Take 1 tablet (10 mg total) by mouth every 6 (six) hours as needed for nausea or vomiting. Patient not taking: Reported on 03/12/2022 02/03/22   Molpus, Jenny Reichmann, MD  ondansetron (ZOFRAN) 4 MG tablet Take 1 tablet (4 mg total) by mouth every 8 (eight) hours as needed for nausea or vomiting. Patient not taking: Reported on 11/29/2022 12/03/21   Dorothyann Peng, PA-C  pantoprazole (PROTONIX) 40 MG tablet Take 1 tablet (40 mg total) by mouth 2 (two) times  daily. Patient not taking: Reported on 11/29/2022 01/10/22 03/13/23  Lennice Sites, DO  promethazine (PHENERGAN) 25 MG tablet Take 1 tablet (25 mg total) by mouth every 6 (six) hours as needed for nausea or vomiting. 11/29/22   Tedd Sias, PA      Allergies    Patient has no known allergies.    Review of Systems   Review of Systems  Gastrointestinal:  Positive for abdominal pain.    Physical Exam Updated Vital Signs BP 112/81   Pulse 89   Temp 98.1 F (36.7 C) (Oral)   Resp 15   SpO2 99%  Physical Exam Vitals and nursing note reviewed.  Constitutional:      General: He is not in acute distress.    Comments: Uncomfortable 57 year old male Nontoxic  HENT:     Head: Normocephalic and atraumatic.     Nose: Nose normal.  Eyes:     General: No scleral icterus. Cardiovascular:     Rate and Rhythm: Normal rate and regular rhythm.     Pulses: Normal pulses.     Heart sounds: Normal heart sounds.  Pulmonary:     Effort: Pulmonary effort is normal. No respiratory distress.     Breath sounds: No wheezing.  Abdominal:     Palpations: Abdomen is soft.     Tenderness: There is abdominal tenderness.     Comments: Epigastric TTP Some diffuse TTP of abd  Musculoskeletal:     Cervical back: Normal range  of motion.     Right lower leg: No edema.     Left lower leg: No edema.  Skin:    General: Skin is warm and dry.     Capillary Refill: Capillary refill takes less than 2 seconds.  Neurological:     Mental Status: He is alert. Mental status is at baseline.  Psychiatric:        Mood and Affect: Mood normal.        Behavior: Behavior normal.     ED Results / Procedures / Treatments   Labs (all labs ordered are listed, but only abnormal results are displayed) Labs Reviewed  COMPREHENSIVE METABOLIC PANEL - Abnormal; Notable for the following components:      Result Value   Sodium 130 (*)    Chloride 97 (*)    CO2 21 (*)    Glucose, Bld 121 (*)    Calcium 8.7 (*)     Albumin 3.3 (*)    AST 74 (*)    Alkaline Phosphatase 246 (*)    All other components within normal limits  CBC - Abnormal; Notable for the following components:   RDW 16.2 (*)    Platelets 117 (*)    All other components within normal limits  LIPASE, BLOOD  URINALYSIS, ROUTINE W REFLEX MICROSCOPIC  RAPID URINE DRUG SCREEN, HOSP PERFORMED    EKG None  Radiology No results found.  Procedures Procedures    Medications Ordered in ED Medications  HYDROmorphone (DILAUDID) injection 1 mg (has no administration in time range)  droperidol (INAPSINE) 2.5 MG/ML injection 1.25 mg (1.25 mg Intravenous Given 12/11/22 1936)  lactated ringers bolus 2,000 mL (2,000 mLs Intravenous New Bag/Given 12/11/22 1936)    ED Course/ Medical Decision Making/ A&P                             Medical Decision Making Amount and/or Complexity of Data Reviewed Labs: ordered.  Risk Prescription drug management.   This patient presents to the ED for concern of abd pain NV, this involves a number of treatment options, and is a complaint that carries with it a moderate to high risk of complications and morbidity. A differential diagnosis was considered for the patient's symptoms which is discussed below:   The causes of generalized abdominal pain include but are not limited to AAA, mesenteric ischemia, appendicitis, diverticulitis, DKA, gastritis, gastroenteritis, AMI, nephrolithiasis, pancreatitis, peritonitis, adrenal insufficiency,lead poisoning, iron toxicity, intestinal ischemia, constipation, UTI,SBO/LBO, splenic rupture, biliary disease, IBD, IBS, PUD, or hepatitis.   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  Patient presented emergency room today with complaints of several weeks of  abdominal pain.  He states that his symptoms are very consistent with chest pain.  He states that he was seen 2 weeks ago for similar symptoms in the emergency room.  It was actually resolved at that time.  He states his symptoms have continued since that time.  No new symptoms.    EMR reviewed including pt PMHx, past surgical history and past visits to ER.   See HPI for more details   Lab Tests:   I ordered and independently interpreted labs. Labs notable for Hyponatremia and hypochloremia otherwise labs relatively unchanged from prior.  Sodium actually not significantly changed from prior.  Will hydrate here in the ER will need to tolerate p.o. prior to discharge.  Lipase within normal limits.  CBC without leukocytosis or anemia urinalysis unremarkable urine drug screen pending.  Imaging Studies:  I reviewed imaging from prior visit 2 weeks ago CT without evidence of pancreatitis.  Did show some evidence of perhaps some colitis.  He is nontoxic-appearing he does not have any blood in his stool.  No indication for antibiotics.    Cardiac Monitoring:  The patient was maintained on a cardiac monitor.  I personally viewed and interpreted the cardiac monitored which showed an underlying rhythm of: NSR EKG non-ischemic normal QT   Medicines ordered:  I ordered medication including Dilaudid, LR 2 L, droperidol for nausea vomiting abdominal pain Reevaluation of the patient after these medicines showed that the patient improved I have reviewed the patients home medicines and have made adjustments as needed   Critical Interventions:     Consults/Attending Physician      Reevaluation:  After the interventions noted above I re-evaluated patient and found that they have :improved   Social Determinants of Health:  The patient's social determinants of health were a factor in the care of this patient    Problem List / ED Course:  Nausea vomiting abdominal pain.  Seems  much improved after 1 dose of droperidol and 0.5 mg of Dilaudid.  Tolerating p.o. and vital signs now normal.  Will discharge home with follow-up with gastroenterology.  Return precautions discussed.] I lengthy discussion with the patient about his normal CT scan from the last visit.  I discussed with him the need for him to follow-up with gastroenterology which she has not done in the past 2 weeks from his last visit.  I told him that repeated visits can end up resulting in care plans which can guide EDPs to not give narcotics. Pt states understanding and states his pain is suddenly resolved completely and states "if you wanna send me home I'm okay."  I do have some concern that the patient is exhibiting narcotic seeking behavior.   Dispostion:  After consideration of the diagnostic results and the patients response to treatment, I feel that the patent would benefit from close outpatient follow-up.  Final Clinical Impression(s) / ED Diagnoses Final diagnoses:  Generalized abdominal pain    Rx / DC Orders ED Discharge Orders     None         Tedd Sias, [place] 12/11/22 2205    Tedd Sias, [place] 12/11/22 2213    Lorelle Gibbs, DO 12/12/22 2336

## 2022-12-11 NOTE — ED Provider Triage Note (Signed)
Emergency Medicine Provider Triage Evaluation Note  Brent Rogers , a 57 y.o. male  was evaluated in triage.  Pt complains of abd pain. Seen 2 weeks ago for same.   States his pain is persistent.  He's been vomiting NBNB.   Endorses fever last night.   Review of Systems  Positive: Abd pain NV Negative: CP  Physical Exam  BP 123/88   Pulse (!) 113   Temp 98.1 F (36.7 C) (Oral)   Resp 16   SpO2 100%  Gen:   Awake, no distress   Resp:  Normal effort  MSK:   Moves extremities without difficulty  Other:  Diffuse abd TTP, tachycardia  Medical Decision Making  Medically screening exam initiated at 7:12 PM.  Appropriate orders placed.  Brent Rogers was informed that the remainder of the evaluation will be completed by another provider, this initial triage assessment does not replace that evaluation, and the importance of remaining in the ED until their evaluation is complete.  Labs, CT   Brent Rogers, [place] 12/11/22 1914

## 2022-12-11 NOTE — ED Triage Notes (Signed)
Pt sates that he is having a flare up from his chronic pancreatitis that has not got better from 2 weeks. Denies ETOH use, c/o of nausea, went to [facility] last night but LWBS.

## 2022-12-11 NOTE — Discharge Instructions (Addendum)
You will need to follow-up with gastroenterology.  I have given you the same gastroenterologist office information that I gave you last time.  Please follow-up with them.

## 2023-03-04 ENCOUNTER — Emergency Department (HOSPITAL_COMMUNITY)
Admission: EM | Admit: 2023-03-04 | Discharge: 2023-03-04 | Disposition: A | Discharge status: Home / Self Care | Payer: BLUE CROSS/BLUE SHIELD | Attending: Emergency Medicine | Admitting: Emergency Medicine

## 2023-03-04 ENCOUNTER — Emergency Department (HOSPITAL_COMMUNITY): Payer: BLUE CROSS/BLUE SHIELD

## 2023-03-04 ENCOUNTER — Other Ambulatory Visit: Payer: Self-pay

## 2023-03-04 DIAGNOSIS — R42 Dizziness and giddiness: Secondary | ICD-10-CM | POA: Insufficient documentation

## 2023-03-04 DIAGNOSIS — R0602 Shortness of breath: Secondary | ICD-10-CM | POA: Diagnosis not present

## 2023-03-04 DIAGNOSIS — E871 Hypo-osmolality and hyponatremia: Secondary | ICD-10-CM | POA: Diagnosis not present

## 2023-03-04 DIAGNOSIS — R112 Nausea with vomiting, unspecified: Secondary | ICD-10-CM | POA: Diagnosis present

## 2023-03-04 LAB — URINALYSIS, ROUTINE W REFLEX MICROSCOPIC
Bilirubin Urine: NEGATIVE
Glucose, UA: NEGATIVE mg/dL
Hgb urine dipstick: NEGATIVE
Ketones, ur: NEGATIVE mg/dL
Leukocytes,Ua: NEGATIVE
Nitrite: NEGATIVE
Protein, ur: NEGATIVE mg/dL
Specific Gravity, Urine: 1.012 (ref 1.005–1.030)
pH: 5 (ref 5.0–8.0)

## 2023-03-04 LAB — CBC
HCT: 33.6 % — ABNORMAL LOW (ref 39.0–52.0)
Hemoglobin: 11 g/dL — ABNORMAL LOW (ref 13.0–17.0)
MCH: 30 pg (ref 26.0–34.0)
MCHC: 32.7 g/dL (ref 30.0–36.0)
MCV: 91.6 fL (ref 80.0–100.0)
Platelets: 173 10*3/uL (ref 150–400)
RBC: 3.67 MIL/uL — ABNORMAL LOW (ref 4.22–5.81)
RDW: 15.5 % (ref 11.5–15.5)
WBC: 9 10*3/uL (ref 4.0–10.5)
nRBC: 0 % (ref 0.0–0.2)

## 2023-03-04 LAB — COMPREHENSIVE METABOLIC PANEL
ALT: 30 U/L (ref 0–44)
AST: 45 U/L — ABNORMAL HIGH (ref 15–41)
Albumin: 2.6 g/dL — ABNORMAL LOW (ref 3.5–5.0)
Alkaline Phosphatase: 245 U/L — ABNORMAL HIGH (ref 38–126)
Anion gap: 12 (ref 5–15)
BUN: 6 mg/dL (ref 6–20)
CO2: 18 mmol/L — ABNORMAL LOW (ref 22–32)
Calcium: 8.4 mg/dL — ABNORMAL LOW (ref 8.9–10.3)
Chloride: 99 mmol/L (ref 98–111)
Creatinine, Ser: 0.65 mg/dL (ref 0.61–1.24)
GFR, Estimated: >60 mL/min (ref >60)
Glucose, Bld: 109 mg/dL — ABNORMAL HIGH (ref 70–99)
Potassium: 4.2 mmol/L (ref 3.5–5.1)
Sodium: 129 mmol/L — ABNORMAL LOW (ref 135–145)
Total Bilirubin: 0.6 mg/dL (ref 0.3–1.2)
Total Protein: 5.8 g/dL — ABNORMAL LOW (ref 6.5–8.1)

## 2023-03-04 LAB — TROPONIN I (HIGH SENSITIVITY)
Troponin I (High Sensitivity): 10 ng/L (ref <18)
Troponin I (High Sensitivity): 12 ng/L (ref <18)

## 2023-03-04 LAB — LIPASE, BLOOD: Lipase: 18 U/L (ref 11–51)

## 2023-03-04 LAB — BRAIN NATRIURETIC PEPTIDE: B Natriuretic Peptide: 28.9 pg/mL (ref 0.0–100.0)

## 2023-03-04 MED ORDER — ONDANSETRON 4 MG PO TBDP
4.0000 mg | ORAL_TABLET | Freq: Three times a day (TID) | ORAL | 0 refills | Status: DC | PRN
  Filled 2023-03-04: qty 20, 7d supply, fill #0

## 2023-03-04 MED ORDER — SODIUM CHLORIDE 0.9 % IV BOLUS
1000.0000 mL | Freq: Once | INTRAVENOUS | Status: AC
  Administered 2023-03-04: 1000 mL via INTRAVENOUS

## 2023-03-04 MED ORDER — ONDANSETRON HCL 4 MG/2ML IJ SOLN
4.0000 mg | Freq: Once | INTRAMUSCULAR | Status: AC
  Administered 2023-03-04: 4 mg via INTRAVENOUS
  Filled 2023-03-04: qty 2

## 2023-03-04 NOTE — Discharge Instructions (Signed)
You were seen in the ER for fatigue and shortness of breath. All of your labs and imaging were reassuring today. I would advise following up with your primary care provider for further evaluation. Return to the ER if symptoms are worsening.

## 2023-03-04 NOTE — ED Triage Notes (Signed)
Pt reports dizziness, chest pain, and sob that worsens with exertion over the past week. Pt reports intermittent nausea, vomiting and weight loss over the past month. Pt reports he has lost approximately 20 pounds. Pt AxOx4.

## 2023-03-04 NOTE — ED Provider Notes (Signed)
[place] EMERGENCY DEPARTMENT AT [facility] Provider Note   CSN: [id number] Arrival date & time: 03/04/23  1524     History Chief Complaint  Patient presents with   Shortness of Breath   Dizziness   Nausea   Emesis    Brent Rogers is a 57 y.o. male.  Patient presents to the emergency department complaints of shortness of breath, dizziness, nausea and vomiting.  He reports this has been present and worsening over the last week or so.  He states he been having intermittent nausea and vomiting as noted some mild weight loss in the last month or so.  Denies any significant chest pain with this as he feels it more of a chest tightness feeling.  Was seen a few weeks ago for different concerns at that time.   Shortness of Breath Associated symptoms: vomiting   Dizziness Associated symptoms: shortness of breath and vomiting   Emesis      Home Medications Prior to Admission medications   Medication Sig Start Date End Date Taking? Authorizing Provider  ondansetron (ZOFRAN-ODT) 4 MG disintegrating tablet Take 1 tablet (4 mg total) by mouth every 8 (eight) hours as needed for nausea or vomiting. 03/04/23  Yes Smitty Knudsen, PA-C  acetaminophen (TYLENOL) 500 MG tablet Take 500 mg by mouth every 6 (six) hours as needed for moderate pain.    [provider]  dicyclomine (BENTYL) 20 MG tablet Take 1 tablet (20 mg total) by mouth 2 (two) times daily. 11/29/22   Gailen Shelter, PA  lipase/protease/amylase (CREON) 36000 UNITS CPEP capsule Take 1 capsule (36,000 Units total) by mouth with breakfast, with lunch, and with evening meal. Patient not taking: Reported on 11/29/2022 01/10/22   Virgina Norfolk, DO  metoCLOPramide (REGLAN) 10 MG tablet Take 1 tablet (10 mg total) by mouth every 6 (six) hours as needed for nausea or vomiting. Patient not taking: Reported on 03/12/2022 02/03/22   Molpus, Jonny Ruiz, MD  ondansetron (ZOFRAN) 4 MG tablet Take 1 tablet (4 mg total) by mouth every 8  (eight) hours as needed for nausea or vomiting. Patient not taking: Reported on 11/29/2022 12/03/21   Darrick Grinder, PA-C  pantoprazole (PROTONIX) 40 MG tablet Take 1 tablet (40 mg total) by mouth 2 (two) times daily. Patient not taking: Reported on 11/29/2022 01/10/22 03/13/23  Virgina Norfolk, DO  promethazine (PHENERGAN) 25 MG tablet Take 1 tablet (25 mg total) by mouth every 6 (six) hours as needed for nausea or vomiting. 11/29/22   Gailen Shelter, PA      Allergies    Patient has no known allergies.    Review of Systems   Review of Systems  Respiratory:  Positive for shortness of breath.   Gastrointestinal:  Positive for vomiting.  Neurological:  Positive for dizziness.  All other systems reviewed and are negative.   Physical Exam Updated Vital Signs BP 112/82   Pulse 86   Temp 98.4 F (36.9 C) (Oral)   Resp 19   Ht 6\' 3"  (1.905 m)   Wt 54.4 kg   SpO2 100%   BMI 15.00 kg/m  Physical Exam Vitals and nursing note reviewed.  Constitutional:      General: He is not in acute distress.    Appearance: He is well-developed.  HENT:     Head: Normocephalic and atraumatic.  Eyes:     Conjunctiva/sclera: Conjunctivae normal.  Cardiovascular:     Rate and Rhythm: Normal rate and regular rhythm.  Heart sounds: No murmur heard. Pulmonary:     Effort: Pulmonary effort is normal. No respiratory distress.     Breath sounds: Normal breath sounds. No decreased breath sounds or wheezing.  Abdominal:     Palpations: Abdomen is soft.     Tenderness: There is no abdominal tenderness.  Musculoskeletal:        General: No swelling.     Cervical back: Neck supple.     Right lower leg: No edema.     Left lower leg: No edema.  Skin:    General: Skin is warm and dry.     Capillary Refill: Capillary refill takes less than 2 seconds.     Findings: No erythema.     Nails: There is no clubbing.  Neurological:     Mental Status: He is alert.  Psychiatric:        Mood and Affect: Mood  normal.     ED Results / Procedures / Treatments   Labs (all labs ordered are listed, but only abnormal results are displayed) Labs Reviewed  COMPREHENSIVE METABOLIC PANEL - Abnormal; Notable for the following components:      Result Value   Sodium 129 (*)    CO2 18 (*)    Glucose, Bld 109 (*)    Calcium 8.4 (*)    Total Protein 5.8 (*)    Albumin 2.6 (*)    AST 45 (*)    Alkaline Phosphatase 245 (*)    All other components within normal limits  CBC - Abnormal; Notable for the following components:   RBC 3.67 (*)    Hemoglobin 11.0 (*)    HCT 33.6 (*)    All other components within normal limits  LIPASE, BLOOD  URINALYSIS, ROUTINE W REFLEX MICROSCOPIC  BRAIN NATRIURETIC PEPTIDE  TROPONIN I (HIGH SENSITIVITY)  TROPONIN I (HIGH SENSITIVITY)    EKG EKG Interpretation  Date/Time:  Wednesday March 04 2023 15:53:58 EDT Ventricular Rate:  91 PR Interval:  146 QRS Duration: 74 QT Interval:  364 QTC Calculation: 447 R Axis:   75 Text Interpretation: Normal sinus rhythm  no acute ST/T changes, similar to Mar 2024 Confirmed by Pricilla Loveless [id number] on 03/04/2023 8:11:27 PM  Radiology DG Chest 2 View  Result Date: 03/04/2023 CLINICAL DATA:  Shortness of breath, dizziness EXAM: CHEST - 2 VIEW COMPARISON:  06/04/2022 FINDINGS: Cardiac size is within normal limits. Increase in AP diameter of the chest may be a normal variation due to patient's body habitus or suggest COPD. There are no new infiltrates or signs of pulmonary edema. There is no pleural effusion or pneumothorax. IMPRESSION: No active cardiopulmonary disease. Electronically Signed   By: Ernie Avena M.D.   On: 03/04/2023 17:23    Procedures Procedures   Medications Ordered in ED Medications  sodium chloride 0.9 % bolus 1,000 mL (0 mLs Intravenous Stopped 03/04/23 2235)  ondansetron (ZOFRAN) injection 4 mg (4 mg Intravenous Given 03/04/23 2309)    ED Course/ Medical Decision Making/ A&P                            Medical Decision Making Amount and/or Complexity of Data Reviewed Labs: ordered. Radiology: ordered.  Risk Prescription drug management.   This patient presents to the ED for concern of shortness of breath.  Differential diagnosis includes pneumonia, bronchitis, pulmonary embolism   Lab Tests:  I Ordered, and personally interpreted labs.  The pertinent results include: CBC with mild  anemia, CMP with hyponatremia noted, BNP negative, troponin negative, UA negative, lipase negative   Imaging Studies ordered:  I ordered imaging studies including chest x-ray I independently visualized and interpreted imaging which showed no acute cardiopulmonary abnormalities. I agree with the radiologist interpretation   Medicines ordered and prescription drug management:  I ordered medication including fluids, Zofran for dehydration, nausea Reevaluation of the patient after these medicines showed that the patient improved I have reviewed the patients home medicines and have made adjustments as needed   Problem List / ED Course:  Patient presented to the ED for shortness of breath.  Patient reports that he has been experiencing some increasing shortness of breath generalized fatigue and some reported possible weight loss.  Patient denies any other acute abnormalities or symptoms such as chest pain or abdominal pain. Lab workup initiated with CBC, CMP, lipase, BMP, troponin, UA.  Lab work largely unremarkable but there are some mild hyponatremia noted on labs. Treated patient with fluids and Zofran patient reports symptomatic improvement.  Chest x-ray and EKG reassuring without any acute vomiting noted. Given unclear cause of patient's symptoms, advised patient to follow-up with PCP for further evaluation as there is no emergent indication for any further evaluation or treatment at this time.  I believe the patient is currently stable for discharge home and patient is agreeable with this treatment  plan.  Encourage patient to return to the emergency department if he has any acute worsening of symptoms.  Patient is agreeable treatment plan verbalized understanding all return precautions.  Final Clinical Impression(s) / ED Diagnoses Final diagnoses:  Nausea and vomiting, unspecified vomiting type  Hyponatremia    Rx / DC Orders ED Discharge Orders          Ordered    ondansetron (ZOFRAN-ODT) 4 MG disintegrating tablet  Every 8 hours PRN        03/04/23 2323              Smitty Knudsen, PA-C 03/04/23 2355    Pricilla Loveless, MD 03/05/23 2315

## 2023-03-04 NOTE — ED Notes (Signed)
This RN attempted iv start x2. Unsuccessful.

## 2023-03-05 ENCOUNTER — Other Ambulatory Visit: Payer: Self-pay

## 2023-03-12 ENCOUNTER — Other Ambulatory Visit: Payer: Self-pay

## 2023-04-17 ENCOUNTER — Inpatient Hospital Stay (HOSPITAL_COMMUNITY)
Admission: EM | Admit: 2023-04-17 | Discharge: 2023-05-22 | DRG: 391 | Disposition: A | Discharge status: Other Acute Inpatient Hospital | Payer: BLUE CROSS/BLUE SHIELD | Attending: Internal Medicine | Admitting: Internal Medicine

## 2023-04-17 ENCOUNTER — Emergency Department (HOSPITAL_COMMUNITY): Payer: BLUE CROSS/BLUE SHIELD

## 2023-04-17 ENCOUNTER — Encounter (HOSPITAL_COMMUNITY): Payer: Self-pay

## 2023-04-17 DIAGNOSIS — K259 Gastric ulcer, unspecified as acute or chronic, without hemorrhage or perforation: Secondary | ICD-10-CM | POA: Diagnosis present

## 2023-04-17 DIAGNOSIS — S91301A Unspecified open wound, right foot, initial encounter: Secondary | ICD-10-CM | POA: Diagnosis present

## 2023-04-17 DIAGNOSIS — F17210 Nicotine dependence, cigarettes, uncomplicated: Secondary | ICD-10-CM | POA: Diagnosis present

## 2023-04-17 DIAGNOSIS — I2510 Atherosclerotic heart disease of native coronary artery without angina pectoris: Secondary | ICD-10-CM | POA: Diagnosis present

## 2023-04-17 DIAGNOSIS — E8770 Fluid overload, unspecified: Secondary | ICD-10-CM | POA: Diagnosis not present

## 2023-04-17 DIAGNOSIS — F32A Depression, unspecified: Secondary | ICD-10-CM | POA: Diagnosis not present

## 2023-04-17 DIAGNOSIS — L988 Other specified disorders of the skin and subcutaneous tissue: Secondary | ICD-10-CM

## 2023-04-17 DIAGNOSIS — Z7189 Other specified counseling: Secondary | ICD-10-CM | POA: Diagnosis not present

## 2023-04-17 DIAGNOSIS — J9601 Acute respiratory failure with hypoxia: Secondary | ICD-10-CM | POA: Diagnosis not present

## 2023-04-17 DIAGNOSIS — B961 Klebsiella pneumoniae [K. pneumoniae] as the cause of diseases classified elsewhere: Secondary | ICD-10-CM | POA: Diagnosis present

## 2023-04-17 DIAGNOSIS — E871 Hypo-osmolality and hyponatremia: Secondary | ICD-10-CM | POA: Diagnosis not present

## 2023-04-17 DIAGNOSIS — E861 Hypovolemia: Secondary | ICD-10-CM | POA: Diagnosis present

## 2023-04-17 DIAGNOSIS — Z515 Encounter for palliative care: Secondary | ICD-10-CM

## 2023-04-17 DIAGNOSIS — K632 Fistula of intestine: Secondary | ICD-10-CM | POA: Diagnosis not present

## 2023-04-17 DIAGNOSIS — K219 Gastro-esophageal reflux disease without esophagitis: Secondary | ICD-10-CM | POA: Diagnosis present

## 2023-04-17 DIAGNOSIS — E876 Hypokalemia: Secondary | ICD-10-CM | POA: Diagnosis not present

## 2023-04-17 DIAGNOSIS — F332 Major depressive disorder, recurrent severe without psychotic features: Secondary | ICD-10-CM | POA: Diagnosis present

## 2023-04-17 DIAGNOSIS — G9340 Encephalopathy, unspecified: Secondary | ICD-10-CM | POA: Diagnosis not present

## 2023-04-17 DIAGNOSIS — I10 Essential (primary) hypertension: Secondary | ICD-10-CM | POA: Diagnosis present

## 2023-04-17 DIAGNOSIS — E870 Hyperosmolality and hypernatremia: Secondary | ICD-10-CM | POA: Diagnosis not present

## 2023-04-17 DIAGNOSIS — Z681 Body mass index (BMI) 19 or less, adult: Secondary | ICD-10-CM

## 2023-04-17 DIAGNOSIS — R6521 Severe sepsis with septic shock: Secondary | ICD-10-CM | POA: Diagnosis not present

## 2023-04-17 DIAGNOSIS — I471 Supraventricular tachycardia, unspecified: Secondary | ICD-10-CM | POA: Diagnosis not present

## 2023-04-17 DIAGNOSIS — R64 Cachexia: Secondary | ICD-10-CM | POA: Diagnosis not present

## 2023-04-17 DIAGNOSIS — E11649 Type 2 diabetes mellitus with hypoglycemia without coma: Secondary | ICD-10-CM | POA: Diagnosis not present

## 2023-04-17 DIAGNOSIS — J690 Pneumonitis due to inhalation of food and vomit: Secondary | ICD-10-CM | POA: Diagnosis not present

## 2023-04-17 DIAGNOSIS — D6489 Other specified anemias: Secondary | ICD-10-CM | POA: Diagnosis not present

## 2023-04-17 DIAGNOSIS — D500 Iron deficiency anemia secondary to blood loss (chronic): Secondary | ICD-10-CM

## 2023-04-17 DIAGNOSIS — M545 Low back pain, unspecified: Secondary | ICD-10-CM | POA: Diagnosis not present

## 2023-04-17 DIAGNOSIS — Z825 Family history of asthma and other chronic lower respiratory diseases: Secondary | ICD-10-CM

## 2023-04-17 DIAGNOSIS — Z90411 Acquired partial absence of pancreas: Secondary | ICD-10-CM

## 2023-04-17 DIAGNOSIS — K529 Noninfective gastroenteritis and colitis, unspecified: Secondary | ICD-10-CM | POA: Diagnosis present

## 2023-04-17 DIAGNOSIS — I509 Heart failure, unspecified: Secondary | ICD-10-CM | POA: Diagnosis not present

## 2023-04-17 DIAGNOSIS — D72823 Leukemoid reaction: Secondary | ICD-10-CM | POA: Diagnosis not present

## 2023-04-17 DIAGNOSIS — T502X5A Adverse effect of carbonic-anhydrase inhibitors, benzothiadiazides and other diuretics, initial encounter: Secondary | ICD-10-CM | POA: Diagnosis not present

## 2023-04-17 DIAGNOSIS — R1032 Left lower quadrant pain: Secondary | ICD-10-CM | POA: Diagnosis present

## 2023-04-17 DIAGNOSIS — K8689 Other specified diseases of pancreas: Secondary | ICD-10-CM | POA: Diagnosis present

## 2023-04-17 DIAGNOSIS — E43 Unspecified severe protein-calorie malnutrition: Secondary | ICD-10-CM | POA: Diagnosis not present

## 2023-04-17 DIAGNOSIS — Z8711 Personal history of peptic ulcer disease: Secondary | ICD-10-CM

## 2023-04-17 DIAGNOSIS — D6959 Other secondary thrombocytopenia: Secondary | ICD-10-CM | POA: Diagnosis not present

## 2023-04-17 DIAGNOSIS — K861 Other chronic pancreatitis: Secondary | ICD-10-CM | POA: Diagnosis not present

## 2023-04-17 DIAGNOSIS — L89326 Pressure-induced deep tissue damage of left buttock: Secondary | ICD-10-CM | POA: Diagnosis not present

## 2023-04-17 DIAGNOSIS — K316 Fistula of stomach and duodenum: Principal | ICD-10-CM | POA: Diagnosis present

## 2023-04-17 DIAGNOSIS — R34 Anuria and oliguria: Secondary | ICD-10-CM | POA: Diagnosis not present

## 2023-04-17 DIAGNOSIS — N390 Urinary tract infection, site not specified: Secondary | ICD-10-CM | POA: Insufficient documentation

## 2023-04-17 DIAGNOSIS — E872 Acidosis, unspecified: Secondary | ICD-10-CM | POA: Diagnosis not present

## 2023-04-17 DIAGNOSIS — J80 Acute respiratory distress syndrome: Secondary | ICD-10-CM | POA: Diagnosis not present

## 2023-04-17 DIAGNOSIS — D509 Iron deficiency anemia, unspecified: Secondary | ICD-10-CM | POA: Diagnosis not present

## 2023-04-17 DIAGNOSIS — Z8249 Family history of ischemic heart disease and other diseases of the circulatory system: Secondary | ICD-10-CM

## 2023-04-17 DIAGNOSIS — K567 Ileus, unspecified: Secondary | ICD-10-CM | POA: Diagnosis not present

## 2023-04-17 DIAGNOSIS — F325 Major depressive disorder, single episode, in full remission: Secondary | ICD-10-CM | POA: Diagnosis not present

## 2023-04-17 DIAGNOSIS — F411 Generalized anxiety disorder: Secondary | ICD-10-CM | POA: Diagnosis present

## 2023-04-17 DIAGNOSIS — Z9049 Acquired absence of other specified parts of digestive tract: Secondary | ICD-10-CM

## 2023-04-17 DIAGNOSIS — N3001 Acute cystitis with hematuria: Secondary | ICD-10-CM | POA: Diagnosis not present

## 2023-04-17 DIAGNOSIS — Z79899 Other long term (current) drug therapy: Secondary | ICD-10-CM

## 2023-04-17 DIAGNOSIS — G894 Chronic pain syndrome: Secondary | ICD-10-CM | POA: Diagnosis not present

## 2023-04-17 DIAGNOSIS — Z5320 Procedure and treatment not carried out because of patient's decision for unspecified reasons: Secondary | ICD-10-CM | POA: Diagnosis present

## 2023-04-17 DIAGNOSIS — A419 Sepsis, unspecified organism: Secondary | ICD-10-CM | POA: Diagnosis not present

## 2023-04-17 DIAGNOSIS — M419 Scoliosis, unspecified: Secondary | ICD-10-CM | POA: Diagnosis present

## 2023-04-17 DIAGNOSIS — R627 Adult failure to thrive: Secondary | ICD-10-CM | POA: Diagnosis present

## 2023-04-17 DIAGNOSIS — L89316 Pressure-induced deep tissue damage of right buttock: Secondary | ICD-10-CM | POA: Diagnosis not present

## 2023-04-17 DIAGNOSIS — R54 Age-related physical debility: Secondary | ICD-10-CM | POA: Diagnosis present

## 2023-04-17 DIAGNOSIS — Z1611 Resistance to penicillins: Secondary | ICD-10-CM | POA: Diagnosis present

## 2023-04-17 DIAGNOSIS — S91302A Unspecified open wound, left foot, initial encounter: Secondary | ICD-10-CM | POA: Diagnosis present

## 2023-04-17 DIAGNOSIS — R7401 Elevation of levels of liver transaminase levels: Secondary | ICD-10-CM | POA: Insufficient documentation

## 2023-04-17 DIAGNOSIS — R609 Edema, unspecified: Secondary | ICD-10-CM | POA: Diagnosis not present

## 2023-04-17 DIAGNOSIS — G4700 Insomnia, unspecified: Secondary | ICD-10-CM | POA: Diagnosis present

## 2023-04-17 LAB — BASIC METABOLIC PANEL
Anion gap: 14 (ref 5–15)
BUN: 10 mg/dL (ref 6–20)
CO2: 22 mmol/L (ref 22–32)
Calcium: 7.8 mg/dL — ABNORMAL LOW (ref 8.9–10.3)
Chloride: 89 mmol/L — ABNORMAL LOW (ref 98–111)
Creatinine, Ser: 0.82 mg/dL (ref 0.61–1.24)
GFR, Estimated: >60 mL/min (ref >60)
Glucose, Bld: 91 mg/dL (ref 70–99)
Potassium: 4.2 mmol/L (ref 3.5–5.1)
Sodium: 125 mmol/L — ABNORMAL LOW (ref 135–145)

## 2023-04-17 LAB — COMPREHENSIVE METABOLIC PANEL
ALT: 47 U/L — ABNORMAL HIGH (ref 0–44)
AST: 63 U/L — ABNORMAL HIGH (ref 15–41)
Albumin: 2.5 g/dL — ABNORMAL LOW (ref 3.5–5.0)
Alkaline Phosphatase: 258 U/L — ABNORMAL HIGH (ref 38–126)
Anion gap: 10 (ref 5–15)
BUN: 11 mg/dL (ref 6–20)
CO2: 23 mmol/L (ref 22–32)
Calcium: 8.2 mg/dL — ABNORMAL LOW (ref 8.9–10.3)
Chloride: 91 mmol/L — ABNORMAL LOW (ref 98–111)
Creatinine, Ser: 1 mg/dL (ref 0.61–1.24)
GFR, Estimated: >60 mL/min (ref >60)
Glucose, Bld: 107 mg/dL — ABNORMAL HIGH (ref 70–99)
Potassium: 4.3 mmol/L (ref 3.5–5.1)
Sodium: 124 mmol/L — ABNORMAL LOW (ref 135–145)
Total Bilirubin: 2 mg/dL — ABNORMAL HIGH (ref 0.3–1.2)
Total Protein: 6.4 g/dL — ABNORMAL LOW (ref 6.5–8.1)

## 2023-04-17 LAB — OSMOLALITY: Osmolality: 266 mOsm/kg — ABNORMAL LOW (ref 275–295)

## 2023-04-17 LAB — URINALYSIS, ROUTINE W REFLEX MICROSCOPIC
Bilirubin Urine: NEGATIVE
Glucose, UA: NEGATIVE mg/dL
Ketones, ur: 5 mg/dL — AB
Nitrite: NEGATIVE
Protein, ur: NEGATIVE mg/dL
Specific Gravity, Urine: 1.008 (ref 1.005–1.030)
WBC, UA: >50 WBC/hpf (ref 0–5)
pH: 5 (ref 5.0–8.0)

## 2023-04-17 LAB — I-STAT CHEM 8, ED
BUN: 12 mg/dL (ref 6–20)
Calcium, Ion: 1 mmol/L — ABNORMAL LOW (ref 1.15–1.40)
Chloride: 89 mmol/L — ABNORMAL LOW (ref 98–111)
Creatinine, Ser: 1 mg/dL (ref 0.61–1.24)
Glucose, Bld: 101 mg/dL — ABNORMAL HIGH (ref 70–99)
HCT: 34 % — ABNORMAL LOW (ref 39.0–52.0)
Hemoglobin: 11.6 g/dL — ABNORMAL LOW (ref 13.0–17.0)
Potassium: 4 mmol/L (ref 3.5–5.1)
Sodium: 123 mmol/L — ABNORMAL LOW (ref 135–145)
TCO2: 23 mmol/L (ref 22–32)

## 2023-04-17 LAB — CBC
HCT: 33 % — ABNORMAL LOW (ref 39.0–52.0)
Hemoglobin: 11.7 g/dL — ABNORMAL LOW (ref 13.0–17.0)
MCH: 29.4 pg (ref 26.0–34.0)
MCHC: 35.5 g/dL (ref 30.0–36.0)
MCV: 82.9 fL (ref 80.0–100.0)
Platelets: 89 10*3/uL — ABNORMAL LOW (ref 150–400)
RBC: 3.98 MIL/uL — ABNORMAL LOW (ref 4.22–5.81)
RDW: 14.3 % (ref 11.5–15.5)
WBC: 12.3 10*3/uL — ABNORMAL HIGH (ref 4.0–10.5)
nRBC: 0 % (ref 0.0–0.2)

## 2023-04-17 LAB — HIV ANTIBODY (ROUTINE TESTING W REFLEX): HIV Screen 4th Generation wRfx: NONREACTIVE

## 2023-04-17 LAB — LIPASE, BLOOD: Lipase: 17 U/L (ref 11–51)

## 2023-04-17 MED ORDER — ACETAMINOPHEN 650 MG RE SUPP: 650.0000 mg | Freq: Four times a day (QID) | RECTAL | Status: DC | PRN

## 2023-04-17 MED ORDER — ACETAMINOPHEN 325 MG PO TABS
650.0000 mg | ORAL_TABLET | Freq: Four times a day (QID) | ORAL | Status: DC | PRN
  Administered 2023-04-20 – 2023-05-18 (×12): 650 mg via ORAL
  Filled 2023-04-17 (×12): qty 2

## 2023-04-17 MED ORDER — METRONIDAZOLE 500 MG/100ML IV SOLN
500.0000 mg | Freq: Once | INTRAVENOUS | Status: AC
  Administered 2023-04-17: 500 mg via INTRAVENOUS

## 2023-04-17 MED ORDER — IOHEXOL 350 MG/ML SOLN
75.0000 mL | Freq: Once | INTRAVENOUS | Status: AC | PRN
  Administered 2023-04-17: 75 mL via INTRAVENOUS

## 2023-04-17 MED ORDER — SODIUM CHLORIDE 0.9 % IV SOLN
2.0000 g | INTRAVENOUS | Status: DC
  Administered 2023-04-18: 2 g via INTRAVENOUS
  Filled 2023-04-17: qty 20

## 2023-04-17 MED ORDER — SODIUM CHLORIDE 0.9% FLUSH
3.0000 mL | Freq: Two times a day (BID) | INTRAVENOUS | Status: DC
  Administered 2023-04-17 – 2023-05-01 (×22): 3 mL via INTRAVENOUS

## 2023-04-17 MED ORDER — SODIUM CHLORIDE 0.9 % IV SOLN
1.0000 g | Freq: Once | INTRAVENOUS | Status: AC
  Administered 2023-04-17: 1 g via INTRAVENOUS

## 2023-04-17 MED ORDER — SODIUM CHLORIDE 0.9 % IV SOLN: INTRAVENOUS | Status: AC

## 2023-04-17 MED ORDER — METRONIDAZOLE 500 MG/100ML IV SOLN
500.0000 mg | Freq: Two times a day (BID) | INTRAVENOUS | Status: DC
  Administered 2023-04-18 – 2023-04-23 (×10): 500 mg via INTRAVENOUS
  Filled 2023-04-17 (×10): qty 100

## 2023-04-17 MED ORDER — LACTATED RINGERS IV BOLUS
500.0000 mL | Freq: Once | INTRAVENOUS | Status: AC
  Administered 2023-04-17: 500 mL via INTRAVENOUS

## 2023-04-17 MED ORDER — HYDROMORPHONE HCL 1 MG/ML IJ SOLN
0.5000 mg | INTRAMUSCULAR | Status: DC | PRN
  Administered 2023-04-18 – 2023-04-20 (×9): 0.5 mg via INTRAVENOUS
  Filled 2023-04-17 (×11): qty 0.5

## 2023-04-17 MED ORDER — POLYETHYLENE GLYCOL 3350 17 G PO PACK: 17.0000 g | PACK | Freq: Every day | ORAL | Status: DC | PRN

## 2023-04-17 NOTE — ED Notes (Signed)
CT verbalizes will be next on list

## 2023-04-17 NOTE — ED Provider Notes (Signed)
[place] EMERGENCY DEPARTMENT AT [facility] Provider Note   CSN: [id number] Arrival date & time: 04/17/23  1106     History  Chief Complaint  Patient presents with   Nausea    Brent Rogers is a 57 y.o. male.  HPI 57 yo male n/v/d llq pain for several days  HO whipple for pancreatic mass..  No fever or chills.  Pain with urinating or having bm  HO whipple for pancreatic mass.  Not taking any of his meds due to being d/c'd from his clinic. Patient with significant weight loss - unable to quantify but clothes now too big      Home Medications Prior to Admission medications   Medication Sig Start Date End Date Taking? Authorizing Provider  acetaminophen (TYLENOL) 500 MG tablet Take 500 mg by mouth every 6 (six) hours as needed for moderate pain.    [provider]  dicyclomine (BENTYL) 20 MG tablet Take 1 tablet (20 mg total) by mouth 2 (two) times daily. 11/29/22   Gailen Shelter, PA  lipase/protease/amylase (CREON) 36000 UNITS CPEP capsule Take 1 capsule (36,000 Units total) by mouth with breakfast, with lunch, and with evening meal. Patient not taking: Reported on 11/29/2022 01/10/22   Virgina Norfolk, DO  metoCLOPramide (REGLAN) 10 MG tablet Take 1 tablet (10 mg total) by mouth every 6 (six) hours as needed for nausea or vomiting. Patient not taking: Reported on 03/12/2022 02/03/22   Molpus, Jonny Ruiz, MD  ondansetron (ZOFRAN) 4 MG tablet Take 1 tablet (4 mg total) by mouth every 8 (eight) hours as needed for nausea or vomiting. Patient not taking: Reported on 11/29/2022 12/03/21   Barrie Dunker B, PA-C  ondansetron (ZOFRAN-ODT) 4 MG disintegrating tablet Take 1 tablet (4 mg total) by mouth every 8 (eight) hours as needed for nausea or vomiting. 03/04/23   Smitty Knudsen, PA-C  pantoprazole (PROTONIX) 40 MG tablet Take 1 tablet (40 mg total) by mouth 2 (two) times daily. Patient not taking: Reported on 11/29/2022 01/10/22 03/13/23  Virgina Norfolk, DO  promethazine  (PHENERGAN) 25 MG tablet Take 1 tablet (25 mg total) by mouth every 6 (six) hours as needed for nausea or vomiting. 11/29/22   Gailen Shelter, PA      Allergies    Patient has no known allergies.    Review of Systems   Review of Systems  Physical Exam Updated Vital Signs BP 103/74   Pulse 70   Temp 98.6 F (37 C)   Resp 14   SpO2 96%  Physical Exam Vitals reviewed.  Constitutional:      Appearance: He is ill-appearing.     Comments: Cacechtic   HENT:     Head: Normocephalic and atraumatic.     Nose: Nose normal.     Mouth/Throat:     Mouth: Mucous membranes are dry.  Eyes:     Extraocular Movements: Extraocular movements intact.     Pupils: Pupils are equal, round, and reactive to light.  Cardiovascular:     Rate and Rhythm: Normal rate and regular rhythm.     Pulses: Normal pulses.  Pulmonary:     Effort: Pulmonary effort is normal.     Breath sounds: Normal breath sounds.  Abdominal:     General: Abdomen is flat. Bowel sounds are normal.     Tenderness: There is abdominal tenderness.  Musculoskeletal:        General: Normal range of motion.     Cervical back: Normal  range of motion.  Skin:    General: Skin is warm.     Capillary Refill: Capillary refill takes less than 2 seconds.  Neurological:     General: No focal deficit present.     Mental Status: He is alert.  Psychiatric:        Mood and Affect: Mood normal.     ED Results / Procedures / Treatments   Labs (all labs ordered are listed, but only abnormal results are displayed) Labs Reviewed  COMPREHENSIVE METABOLIC PANEL - Abnormal; Notable for the following components:      Result Value   Sodium 124 (*)    Chloride 91 (*)    Glucose, Bld 107 (*)    Calcium 8.2 (*)    Total Protein 6.4 (*)    Albumin 2.5 (*)    AST 63 (*)    ALT 47 (*)    Alkaline Phosphatase 258 (*)    Total Bilirubin 2.0 (*)    All other components within normal limits  CBC - Abnormal; Notable for the following  components:   WBC 12.3 (*)    RBC 3.98 (*)    Hemoglobin 11.7 (*)    HCT 33.0 (*)    Platelets 89 (*)    All other components within normal limits  URINALYSIS, ROUTINE W REFLEX MICROSCOPIC - Abnormal; Notable for the following components:   APPearance HAZY (*)    Hgb urine dipstick SMALL (*)    Ketones, ur 5 (*)    Leukocytes,Ua LARGE (*)    Bacteria, UA MANY (*)    All other components within normal limits  I-STAT CHEM 8, ED - Abnormal; Notable for the following components:   Sodium 123 (*)    Chloride 89 (*)    Glucose, Bld 101 (*)    Calcium, Ion 1.00 (*)    Hemoglobin 11.6 (*)    HCT 34.0 (*)    All other components within normal limits  LIPASE, BLOOD    EKG None  Radiology CT ABDOMEN PELVIS W CONTRAST  Result Date: 04/17/2023 CLINICAL DATA:  Left lower quadrant abdominal pain EXAM: CT ABDOMEN AND PELVIS WITH CONTRAST TECHNIQUE: Multidetector CT imaging of the abdomen and pelvis was performed using the standard protocol following bolus administration of intravenous contrast. RADIATION DOSE REDUCTION: This exam was performed according to the departmental dose-optimization program which includes automated exposure control, adjustment of the mA and/or kV according to patient size and/or use of iterative reconstruction technique. CONTRAST:  75mL OMNIPAQUE IOHEXOL 350 MG/ML SOLN COMPARISON:  CT abdomen and pelvis dated 11/29/2022 04/15/2022 FINDINGS: Lower chest: No focal consolidation or pulmonary nodule in the lung bases. No pleural effusion or pneumothorax demonstrated. Partially imaged heart size is normal. Hepatobiliary: Severe steatosis. No focal hepatic lesions. No intra or extrahepatic biliary ductal dilation. Cholecystectomy. Pancreas: Status post Whipple. No main pancreatic ductal dilation inflammatory changes. Spleen: Normal in size without focal abnormality. Adrenals/Urinary Tract: No adrenal nodules. No suspicious renal mass, calculi or hydronephrosis. Left urothelial  thickening. No focal bladder wall thickening. Stomach/Bowel: Postsurgical changes of the stomach. 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 Mild thickening of the descending colon and rectosigmoid colon, which is mildly underdistended. Normal appendix. Vascular/Lymphatic: Aortic atherosclerosis. No enlarged abdominal or pelvic lymph nodes. Reproductive: Prostate is unremarkable. Other: No free fluid, fluid collection, or free air. Musculoskeletal: No acute  or abnormal lytic or blastic osseous lesions. Multilevel degenerative changes of the partially imaged thoracic and lumbar spine. IMPRESSION: 1. 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 2. Mild thickening of the descending colon and rectosigmoid colon, which may be due to underdistention or mild colitis. 3. Left urothelial thickening, which may be due to ascending urinary tract infection. Recommend correlation with urinalysis. 4. Severe hepatic steatosis. 5.  Aortic Atherosclerosis (ICD10-I70.0). Electronically Signed   By: Agustin Cree M.D.   On: 04/17/2023 15:08    Procedures Procedures    Medications Ordered in ED Medications  cefTRIAXone (ROCEPHIN) 1 g in sodium chloride 0.9 % 100 mL IVPB (has no administration in time range)  metroNIDAZOLE (FLAGYL) IVPB 500 mg (has no administration in time range)  lactated ringers bolus 500 mL (0 mLs Intravenous Stopped 04/17/23 1259)  iohexol (OMNIPAQUE) 350 MG/ML injection 75 mL (75 mLs Intravenous Contrast Given 04/17/23 1450)    ED Course/ Medical Decision Making/ A&P Clinical Course as of 04/17/23 1601  Fri Apr 17, 2023  1306 Cmet reviewed with hyponatremia and elevated bili and  transaminases  [DR]  1432 Cbc with leukocytosis and mild anemia, thrombocytopenia at 89,000 worsened from first prior of 1 month ago [DR]    Clinical Course User Index [DR] Margarita Grizzle, MD                             Medical Decision Making Amount and/or Complexity of Data Reviewed Labs: ordered. Radiology: ordered.  Risk Prescription drug management.   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 Patient evaluated here in the ED with labs including CBC which shows a mild leukocytosis and mild anemia and thrombocytopenia Complete metabolic panel significant for hyponatremia sodium 124, elevated transaminases and bilirubin Urinalysis obtained shows greater than 50 white blood cells and many bacteria CT scan obtained and is significant for mural thickening of the distal transverse colon complex fistulous connection from the transverse colon to the gastrojejunal anastomosis and adjacent small bowel loops Mild thickening of the descending colon Left urethral thickening which may be due to ascending urinary tract infection. Based on patient's symptoms of nausea, vomiting, diarrhea, and pain with urination.  Patient likely has urinary tract infection as well as colitis. 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 Carolynne Edouard who will see for general surgery       Final Clinical Impression(s) / ED Diagnoses Final diagnoses:  Colitis  Fistula  Urinary tract infection without hematuria, site unspecified    Rx / DC Orders ED Discharge Orders     None         Margarita Grizzle, MD 04/17/23 1601

## 2023-04-17 NOTE — ED Notes (Signed)
To CT via stretcher, no changes

## 2023-04-17 NOTE — H&P (Signed)
History and Physical   Brent Rogers ZOX:[id number] DOB: 1966/06/28 DOA: 04/17/2023  PCP: Marcine Matar, MD   Patient coming from: Home  Chief Complaint: Nausea, abdominal pain  HPI: Brent Rogers is a 57 y.o. male with medical history significant of hypertension, GERD, PUD, CAD, depression, anxiety, chronic pain, anemia, status post partial pancreatectomy, status post cholecystectomy presenting with nausea and abdominal pain.  Patient reports several days of nausea with vomiting as well as diarrhea and left lower quadrant abdominal pain for the past several days.  Reports pain worse with urinating or having a bowel movement.  Has had significant weight loss unsure how much.  States he has not been taking of his medications since being discharged from his PCP's clinic about a year ago  Denies fevers, chills, chest pain, shortness of breath  ED Course: Vital signs in the ED notable for respiratory rate in the teens to 20s.  Lab workup included CMP with sodium 124, chloride 91, glucose 107, calcium 8.2, protein 6.4, albumin 2.5, AST 63, ALT 47, alk phos 258, T. bili 2.  CBC with hemoglobin stable at 11.7, leukocytosis to 12.3, thrombocytopenia at 89.  Urinalysis with hemoglobin, ketones, leukocytes, bacteria.  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  General surgery consulted and are following.  Patient started on ceftriaxone and Flagyl in the ED.  Also received 500 cc IV fluids.  Review of Systems: As per HPI otherwise all other systems reviewed and are negative.  Past Medical History:  Diagnosis Date   Benign tumor of endocrine pancreas    Chronic pancreatitis (HCC)    S/P Whipple   Foot ulcer with fat layer exposed (HCC)    Hypertension    Migraine    "@ least once/month" (11/12/2015)   Pancreatic abnormality    CT  shows mass   Pneumonia 11/12/2015   Scoliosis      Past Surgical History:  Procedure Laterality Date   BACK SURGERY     BIOPSY  09/26/2021   Procedure: BIOPSY;  Surgeon: Meryl Dare, MD;  Location: [facility] ENDOSCOPY;  Service: Endoscopy;;   COLONOSCOPY WITH PROPOFOL N/A 03/11/2021   Procedure: COLONOSCOPY WITH PROPOFOL;  Surgeon: Jeani Hawking, MD;  Location: [facility] ENDOSCOPY;  Service: Endoscopy;  Laterality: N/A;   ESOPHAGOGASTRODUODENOSCOPY (EGD) WITH PROPOFOL N/A 03/11/2021   Procedure: ESOPHAGOGASTRODUODENOSCOPY (EGD) WITH PROPOFOL;  Surgeon: Jeani Hawking, MD;  Location: [facility] ENDOSCOPY;  Service: Endoscopy;  Laterality: N/A;   ESOPHAGOGASTRODUODENOSCOPY (EGD) WITH PROPOFOL N/A 09/26/2021   Procedure: ESOPHAGOGASTRODUODENOSCOPY (EGD) WITH PROPOFOL;  Surgeon: Meryl Dare, MD;  Location: [facility] ENDOSCOPY;  Service: Endoscopy;  Laterality: N/A;   EUS N/A 09/21/2013   Procedure: ESOPHAGEAL ENDOSCOPIC ULTRASOUND (EUS) RADIAL;  Surgeon: Theda Belfast, MD;  Location: [facility] ENDOSCOPY;  Service: Endoscopy;  Laterality: N/A;   EUS N/A 09/30/2013   Procedure: UPPER ENDOSCOPIC ULTRASOUND (EUS) LINEAR;  Surgeon: Theda Belfast, MD;  Location: [facility] ENDOSCOPY;  Service: Endoscopy;  Laterality: N/A;   FINE NEEDLE ASPIRATION N/A 09/21/2013   Procedure: FINE NEEDLE ASPIRATION (FNA) LINEAR;  Surgeon: Theda Belfast, MD;  Location: [facility] ENDOSCOPY;  Service: Endoscopy;  Laterality: N/A;   FINGER FRACTURE SURGERY Left 1995   5th digit   FRACTURE SURGERY     HEMOSTASIS CLIP PLACEMENT  03/11/2021   Procedure: HEMOSTASIS CLIP PLACEMENT;  Surgeon: Jeani Hawking, MD;  Location: [facility] ENDOSCOPY;  Service: Endoscopy;;  HEMOSTASIS CONTROL  03/11/2021   Procedure: HEMOSTASIS CONTROL;  Surgeon: Jeani Hawking, MD;  Location: [facility] ENDOSCOPY;  Service: Endoscopy;;   KNEE ARTHROSCOPY Bilateral 1987-1989   right-left   LAPAROSCOPY N/A 12/01/2013   Procedure: LAPAROSCOPY DIAGNOSTIC PANCREATICODUODENECTOMY WITH BILIARY AND PANCREATIC STENTS;  Surgeon: Almond Lint, MD;  Location: [facility] ORS;   Service: General;  Laterality: N/A;   LUMBAR DISC SURGERY  1990's   SAVORY DILATION N/A 03/11/2021   Procedure: SAVORY DILATION;  Surgeon: Jeani Hawking, MD;  Location: [facility] ENDOSCOPY;  Service: Endoscopy;  Laterality: N/A;   WHIPPLE PROCEDURE  12/01/2013    Social History  reports that he has been smoking cigarettes. He has quit using smokeless tobacco.  His smokeless tobacco use included snuff. He reports current alcohol use of about 2.0 standard drinks of alcohol per week. He reports that he does not use drugs.  No Known Allergies  Family History  Problem Relation Age of Onset   Cancer Mother        unsure   COPD Mother    Hypertension Mother    Hypertension Father    COPD Father    CAD Sister        possible has stent per pt  Reviewed on admission  Prior to Admission medications   Medication Sig Start Date End Date Taking? Authorizing Provider  acetaminophen (TYLENOL) 500 MG tablet Take 500 mg by mouth every 6 (six) hours as needed for moderate pain.    [provider]  dicyclomine (BENTYL) 20 MG tablet Take 1 tablet (20 mg total) by mouth 2 (two) times daily. 11/29/22   Gailen Shelter, PA  lipase/protease/amylase (CREON) 36000 UNITS CPEP capsule Take 1 capsule (36,000 Units total) by mouth with breakfast, with lunch, and with evening meal. Patient not taking: Reported on 11/29/2022 01/10/22   Virgina Norfolk, DO  metoCLOPramide (REGLAN) 10 MG tablet Take 1 tablet (10 mg total) by mouth every 6 (six) hours as needed for nausea or vomiting. Patient not taking: Reported on 03/12/2022 02/03/22   Molpus, Jonny Ruiz, MD  ondansetron (ZOFRAN) 4 MG tablet Take 1 tablet (4 mg total) by mouth every 8 (eight) hours as needed for nausea or vomiting. Patient not taking: Reported on 11/29/2022 12/03/21   Barrie Dunker B, PA-C  ondansetron (ZOFRAN-ODT) 4 MG disintegrating tablet Take 1 tablet (4 mg total) by mouth every 8 (eight) hours as needed for nausea or vomiting. 03/04/23   Smitty Knudsen, PA-C   pantoprazole (PROTONIX) 40 MG tablet Take 1 tablet (40 mg total) by mouth 2 (two) times daily. Patient not taking: Reported on 11/29/2022 01/10/22 03/13/23  Virgina Norfolk, DO  promethazine (PHENERGAN) 25 MG tablet Take 1 tablet (25 mg total) by mouth every 6 (six) hours as needed for nausea or vomiting. 11/29/22   Gailen Shelter, PA    Physical Exam: Vitals:   04/17/23 1615 04/17/23 1630 04/17/23 1715 04/17/23 1750  BP: 92/69 105/81  92/78  Pulse:   60 (!) 58  Resp: 14 19 13 18   Temp:    98.7 F (37.1 C)  TempSrc:    Oral  SpO2:   100% 100%    Physical Exam Constitutional:      General: He is not in acute distress.    Appearance: Normal appearance.  HENT:     Head: Normocephalic and atraumatic.     Mouth/Throat:     Mouth: Mucous membranes are moist.     Pharynx: Oropharynx is clear.  Eyes:  Extraocular Movements: Extraocular movements intact.     Pupils: Pupils are equal, round, and reactive to light.  Cardiovascular:     Rate and Rhythm: Normal rate and regular rhythm.     Pulses: Normal pulses.     Heart sounds: Normal heart sounds.  Pulmonary:     Effort: Pulmonary effort is normal. No respiratory distress.     Breath sounds: Normal breath sounds.  Abdominal:     General: Bowel sounds are normal. There is no distension.     Palpations: Abdomen is soft.     Tenderness: There is abdominal tenderness.  Musculoskeletal:        General: No swelling or deformity.  Skin:    General: Skin is warm and dry.  Neurological:     General: No focal deficit present.     Mental Status: Mental status is at baseline.    Labs on Admission: I have personally reviewed following labs and imaging studies  CBC: Recent Labs  Lab 04/17/23 1110 04/17/23 1225  WBC 12.3*  --   HGB 11.7* 11.6*  HCT 33.0* 34.0*  MCV 82.9  --   PLT 89*  --     Basic Metabolic Panel: Recent Labs  Lab 04/17/23 1110 04/17/23 1225  NA 124* 123*  K 4.3 4.0  CL 91* 89*  CO2 23  --   GLUCOSE  107* 101*  BUN 11 12  CREATININE 1.00 1.00  CALCIUM 8.2*  --     GFR: CrCl cannot be calculated (Unknown ideal weight.).  Liver Function Tests: Recent Labs  Lab 04/17/23 1110  AST 63*  ALT 47*  ALKPHOS 258*  BILITOT 2.0*  PROT 6.4*  ALBUMIN 2.5*    Urine analysis:    Component Value Date/Time   COLORURINE YELLOW 04/17/2023 1518   APPEARANCEUR HAZY (A) 04/17/2023 1518   LABSPEC 1.008 04/17/2023 1518   PHURINE 5.0 04/17/2023 1518   GLUCOSEU NEGATIVE 04/17/2023 1518   HGBUR SMALL (A) 04/17/2023 1518   BILIRUBINUR NEGATIVE 04/17/2023 1518   KETONESUR 5 (A) 04/17/2023 1518   PROTEINUR NEGATIVE 04/17/2023 1518   UROBILINOGEN 1.0 06/23/2015 1513   NITRITE NEGATIVE 04/17/2023 1518   LEUKOCYTESUR LARGE (A) 04/17/2023 1518    Radiological Exams on Admission: CT ABDOMEN PELVIS W CONTRAST  Result Date: 04/17/2023 CLINICAL DATA:  Left lower quadrant abdominal pain EXAM: CT ABDOMEN AND PELVIS WITH CONTRAST TECHNIQUE: Multidetector CT imaging of the abdomen and pelvis was performed using the standard protocol following bolus administration of intravenous contrast. RADIATION DOSE REDUCTION: This exam was performed according to the departmental dose-optimization program which includes automated exposure control, adjustment of the mA and/or kV according to patient size and/or use of iterative reconstruction technique. CONTRAST:  75mL OMNIPAQUE IOHEXOL 350 MG/ML SOLN COMPARISON:  CT abdomen and pelvis dated 11/29/2022 04/15/2022 FINDINGS: Lower chest: No focal consolidation or pulmonary nodule in the lung bases. No pleural effusion or pneumothorax demonstrated. Partially imaged heart size is normal. Hepatobiliary: Severe steatosis. No focal hepatic lesions. No intra or extrahepatic biliary ductal dilation. Cholecystectomy. Pancreas: Status post Whipple. No main pancreatic ductal dilation inflammatory changes. Spleen: Normal in size without focal abnormality. Adrenals/Urinary Tract: No adrenal  nodules. No suspicious renal mass, calculi or hydronephrosis. Left urothelial thickening. No focal bladder wall thickening. Stomach/Bowel: Postsurgical changes of the stomach. 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 Mild thickening  of the descending colon and rectosigmoid colon, which is mildly underdistended. Normal appendix. Vascular/Lymphatic: Aortic atherosclerosis. No enlarged abdominal or pelvic lymph nodes. Reproductive: Prostate is unremarkable. Other: No free fluid, fluid collection, or free air. Musculoskeletal: No acute or abnormal lytic or blastic osseous lesions. Multilevel degenerative changes of the partially imaged thoracic and lumbar spine. IMPRESSION: 1. 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 2. Mild thickening of the descending colon and rectosigmoid colon, which may be due to underdistention or mild colitis. 3. Left urothelial thickening, which may be due to ascending urinary tract infection. Recommend correlation with urinalysis. 4. Severe hepatic steatosis. 5.  Aortic Atherosclerosis (ICD10-I70.0). Electronically Signed   By: Agustin Cree M.D.   On: 04/17/2023 15:08    EKG: Ordered in the ED but not yet performed  Assessment/Plan Principal Problem:   Hyponatremia Active Problems:   Pancreatic insufficiency   HTN (hypertension)   GERD (gastroesophageal reflux disease)   History of partial pancreatectomy   Chronic pain syndrome   Iron deficiency anemia   Atherosclerosis of native coronary artery of native heart without angina pectoris   Major depressive disorder, recurrent episode, severe (HCC)   GAD (generalized anxiety disorder)   Gastric ulcer  without hemorrhage or perforation   Transaminitis   Colitis   UTI (urinary tract infection)   Colitis UTI Transaminitis > Presenting with ongoing nausea, vomiting, diarrhea as well as abdominal pain that is worse with bowel movement or urination. > CT in the ED showed evidence of colitis with mural wall thickening of transverse colon with fistulous connections to the GJ junction and small bowel.  Also noted was thickening of the descending colon and sigmoid colon as well as bladder wall thickening. > Noted to have leukocytosis to 12.3 and urinalysis with leukocytes and bacteria. > Mild LFT elevations with AST 63, ALT 47, alk phos 258, T. bili 2.  Likely reactive in the setting of colitis. > Started on ceftriaxone and Flagyl in the ED to cover for colitis and UTI.  General surgery consulted and are following no need for urgent surgical intervention at this time. - Continue with ceftriaxone and Flagyl - Trend fever curve and WBC - Trend LFTs - Supportive care - Clear liquid diet - PRN pain medication - SCDs  Hyponatremia > Incidentally noted to have sodium of 124.  Suspect this could be related to all of his vomiting and diarrhea. > Has received 500 cc IV fluid. - Trend renal function and electrolytes overnight - 75 cc an hour of normal saline - Check osmolality and sodium studies - Goal sodium of approximately 130 at 24hrs from initial labs.  Status post Whipple History of pancreatic mass History of chronic pancreatitis Chron Pain > Previously on Creon but has been off of this, says it did help previously  GERD PUD > Off medications since discharge from PCP clinic. - Resume PPI  Anemia Thrombocytopenia > Hemoglobin stable at 11.7.  Platelets 89. - Trend CBC  Depression Anxiety CAD Hypertension - Not currently on any medications for this  DVT prophylaxis: SCDs Code Status:   Full Family Communication:  None on admission  Disposition Plan:   Patient is  from:  Home  Anticipated DC to:  Home  Anticipated DC date:  1 to 3 days  Anticipated DC barriers: None  Consults called:  General surgery consulted in ED Admission  status:  Observation, progressive  Severity of Illness: The appropriate patient status for this patient is OBSERVATION. Observation status is judged to be reasonable and necessary in order to provide the required intensity of service to ensure the patient's safety. The patient's presenting symptoms, physical exam findings, and initial radiographic and laboratory data in the context of their medical condition is felt to place them at decreased risk for further clinical deterioration. Furthermore, it is anticipated that the patient will be medically stable for discharge from the hospital within 2 midnights of admission.    Synetta Fail MD [facility]  How to contact the [facility] Attending or Consulting provider 7A - 7P or covering provider during after hours 7P -7A, for this patient?   Check the care team in [facility] and look for a) attending/consulting [facility] provider listed and b) the [facility] team listed Log into [email or web address] and use [place]'s universal password to access. If you do not have the password, please contact the hospital operator. Locate the [facility] provider you are looking for under [facility] and page to a number that you can be directly reached. If you still have difficulty reaching the provider, please page the [facility] (Director on Call) for the Hospitalists listed on amion for assistance.  04/17/2023, 6:08 PM

## 2023-04-17 NOTE — Consult Note (Signed)
Brent Rogers 14-Nov-1965  [id number].    Requesting MD: Dr. Rosalia Hammers Chief Complaint/Reason for Consult: Colitis   HPI: Brent Rogers is a 57 y.o. male with hx of whipple in 2015 by Dr. Donell Beers for Exocrine pancreatic insufficiency who presented to the ED with dysuria. Reports over the last 4 days he has had fever, n/v, left sided abdominal pain, diarrhea and dysuria. Has had difficulty maintaining his weight. It has been several years since he followed up in the office.   In the ED he was afebrile without tachycardia or hypotension. WBC 12.3. AST/ALT/Alk Phos slightly elevated from baseline. T. Bili 2.0. CT scan shows mural thickening of the distal transverse colon, where there is a complex fistulous connection between the transverse colon to the gastrojejunal anastomosis and adjacent small bowel loops. There is also mild thickening of the descending colon and rectosigmoid colon. Last colonoscopy was in 2022 that showed scattered small and large-mouthed diverticula were found in the entire colon but was otherwise normal.   ROS: ROS As above, see hpi  Family History  Problem Relation Age of Onset   Cancer Mother        unsure   COPD Mother    Hypertension Mother    Hypertension Father    COPD Father    CAD Sister        possible has stent per pt    Past Medical History:  Diagnosis Date   Benign tumor of endocrine pancreas    Chronic pancreatitis (HCC)    S/P Whipple   Foot ulcer with fat layer exposed (HCC)    Hypertension    Migraine    "@ least once/month" (11/12/2015)   Pancreatic abnormality    CT  shows mass   Pneumonia 11/12/2015   Scoliosis     Past Surgical History:  Procedure Laterality Date   BACK SURGERY     BIOPSY  09/26/2021   Procedure: BIOPSY;  Surgeon: Meryl Dare, MD;  Location: [facility] ENDOSCOPY;  Service: Endoscopy;;   COLONOSCOPY WITH PROPOFOL N/A 03/11/2021   Procedure: COLONOSCOPY WITH PROPOFOL;  Surgeon: Jeani Hawking, MD;  Location: [facility]  ENDOSCOPY;  Service: Endoscopy;  Laterality: N/A;   ESOPHAGOGASTRODUODENOSCOPY (EGD) WITH PROPOFOL N/A 03/11/2021   Procedure: ESOPHAGOGASTRODUODENOSCOPY (EGD) WITH PROPOFOL;  Surgeon: Jeani Hawking, MD;  Location: [facility] ENDOSCOPY;  Service: Endoscopy;  Laterality: N/A;   ESOPHAGOGASTRODUODENOSCOPY (EGD) WITH PROPOFOL N/A 09/26/2021   Procedure: ESOPHAGOGASTRODUODENOSCOPY (EGD) WITH PROPOFOL;  Surgeon: Meryl Dare, MD;  Location: [facility] ENDOSCOPY;  Service: Endoscopy;  Laterality: N/A;   EUS N/A 09/21/2013   Procedure: ESOPHAGEAL ENDOSCOPIC ULTRASOUND (EUS) RADIAL;  Surgeon: Theda Belfast, MD;  Location: [facility] ENDOSCOPY;  Service: Endoscopy;  Laterality: N/A;   EUS N/A 09/30/2013   Procedure: UPPER ENDOSCOPIC ULTRASOUND (EUS) LINEAR;  Surgeon: Theda Belfast, MD;  Location: [facility] ENDOSCOPY;  Service: Endoscopy;  Laterality: N/A;   FINE NEEDLE ASPIRATION N/A 09/21/2013   Procedure: FINE NEEDLE ASPIRATION (FNA) LINEAR;  Surgeon: Theda Belfast, MD;  Location: [facility] ENDOSCOPY;  Service: Endoscopy;  Laterality: N/A;   FINGER FRACTURE SURGERY Left 1995   5th digit   FRACTURE SURGERY     HEMOSTASIS CLIP PLACEMENT  03/11/2021   Procedure: HEMOSTASIS CLIP PLACEMENT;  Surgeon: Jeani Hawking, MD;  Location: [facility] ENDOSCOPY;  Service: Endoscopy;;   HEMOSTASIS CONTROL  03/11/2021   Procedure: HEMOSTASIS CONTROL;  Surgeon: Jeani Hawking, MD;  Location: [facility] ENDOSCOPY;  Service: Endoscopy;;   KNEE ARTHROSCOPY Bilateral 1987-1989  right-left   LAPAROSCOPY N/A 12/01/2013   Procedure: LAPAROSCOPY DIAGNOSTIC PANCREATICODUODENECTOMY WITH BILIARY AND PANCREATIC STENTS;  Surgeon: Almond Lint, MD;  Location: [facility] ORS;  Service: General;  Laterality: N/A;   LUMBAR DISC SURGERY  1990's   SAVORY DILATION N/A 03/11/2021   Procedure: SAVORY DILATION;  Surgeon: Jeani Hawking, MD;  Location: [facility] ENDOSCOPY;  Service: Endoscopy;  Laterality: N/A;   WHIPPLE PROCEDURE  12/01/2013    Social History:  reports that he has been smoking cigarettes. He  has quit using smokeless tobacco.  His smokeless tobacco use included snuff. He reports current alcohol use of about 2.0 standard drinks of alcohol per week. He reports that he does not use drugs.  Allergies: No Known Allergies  (Not in a hospital admission)    Physical Exam: Blood pressure 103/74, pulse 70, temperature 98.6 F (37 C), resp. rate 14, SpO2 96%. General: pleasant male who is laying in bed in NAD HEENT: head is normocephalic, atraumatic.  Heart: regular, rate, and rhythm Lungs: Respiratory effort nonlabored Abd: Soft, mild distension, left sided abdominal ttp without rigidity or guarding Skin: warm and dry   Results for orders placed or performed during the hospital encounter of 04/17/23 (from the past 48 hour(s))  Lipase, blood     Status: None   Collection Time: 04/17/23 11:10 AM  Result Value Ref Range   Lipase 17 11 - 51 U/L    Comment: Performed at [facility], [address] [place], [place] [address]  Comprehensive metabolic panel     Status: Abnormal   Collection Time: 04/17/23 11:10 AM  Result Value Ref Range   Sodium 124 (L) 135 - 145 mmol/L   Potassium 4.3 3.5 - 5.1 mmol/L   Chloride 91 (L) 98 - 111 mmol/L   CO2 23 22 - 32 mmol/L   Glucose, Bld 107 (H) 70 - 99 mg/dL    Comment: Glucose reference range applies only to samples taken after fasting for at least 8 hours.   BUN 11 6 - 20 mg/dL   Creatinine, Ser 6.04 0.61 - 1.24 mg/dL   Calcium 8.2 (L) 8.9 - 10.3 mg/dL   Total Protein 6.4 (L) 6.5 - 8.1 g/dL   Albumin 2.5 (L) 3.5 - 5.0 g/dL   AST 63 (H) 15 - 41 U/L   ALT 47 (H) 0 - 44 U/L   Alkaline Phosphatase 258 (H) 38 - 126 U/L   Total Bilirubin 2.0 (H) 0.3 - 1.2 mg/dL   GFR, Estimated >54 >09 mL/min    Comment: (NOTE) Calculated using the CKD-EPI Creatinine Equation (2021)    Anion gap 10 5 - 15    Comment: Performed at [facility], [address], [place], [place] [address]  CBC     Status: Abnormal   Collection Time: 04/17/23  11:10 AM  Result Value Ref Range   WBC 12.3 (H) 4.0 - 10.5 K/uL   RBC 3.98 (L) 4.22 - 5.81 MIL/uL   Hemoglobin 11.7 (L) 13.0 - 17.0 g/dL   HCT 47.8 (L) 29.5 - 62.1 %   MCV 82.9 80.0 - 100.0 fL   MCH 29.4 26.0 - 34.0 pg   MCHC 35.5 30.0 - 36.0 g/dL   RDW 30.8 65.7 - 84.6 %   Platelets 89 (L) 150 - 400 K/uL    Comment: Immature Platelet Fraction may be clinically indicated, consider ordering this additional test [id number] REPEATED TO VERIFY PLATELET COUNT CONFIRMED BY SMEAR    nRBC 0.0 0.0 -  0.2 %    Comment: Performed at [facility], [address] [place], [place] [address]  I-stat chem 8, ED (not at [facility], [facility] or [facility])     Status: Abnormal   Collection Time: 04/17/23 12:25 PM  Result Value Ref Range   Sodium 123 (L) 135 - 145 mmol/L   Potassium 4.0 3.5 - 5.1 mmol/L   Chloride 89 (L) 98 - 111 mmol/L   BUN 12 6 - 20 mg/dL   Creatinine, Ser 6.29 0.61 - 1.24 mg/dL   Glucose, Bld 528 (H) 70 - 99 mg/dL    Comment: Glucose reference range applies only to samples taken after fasting for at least 8 hours.   Calcium, Ion 1.00 (L) 1.15 - 1.40 mmol/L   TCO2 23 22 - 32 mmol/L   Hemoglobin 11.6 (L) 13.0 - 17.0 g/dL   HCT 41.3 (L) 24.4 - 01.0 %  Urinalysis, Routine w reflex microscopic -Urine, Clean Catch     Status: Abnormal   Collection Time: 04/17/23  3:18 PM  Result Value Ref Range   Color, Urine YELLOW YELLOW   APPearance HAZY (A) CLEAR   Specific Gravity, Urine 1.008 1.005 - 1.030   pH 5.0 5.0 - 8.0   Glucose, UA NEGATIVE NEGATIVE mg/dL   Hgb urine dipstick SMALL (A) NEGATIVE   Bilirubin Urine NEGATIVE NEGATIVE   Ketones, ur 5 (A) NEGATIVE mg/dL   Protein, ur NEGATIVE NEGATIVE mg/dL   Nitrite NEGATIVE NEGATIVE   Leukocytes,Ua LARGE (A) NEGATIVE   RBC / HPF 0-5 0 - 5 RBC/hpf   WBC, UA >50 0 - 5 WBC/hpf   Bacteria, UA MANY (A) NONE SEEN   Squamous Epithelial / HPF 0-5 0 - 5 /HPF   Mucus PRESENT     Comment: Performed at [facility], [address],  [place], [place] [address]   CT ABDOMEN PELVIS W CONTRAST  Result Date: 04/17/2023 CLINICAL DATA:  Left lower quadrant abdominal pain EXAM: CT ABDOMEN AND PELVIS WITH CONTRAST TECHNIQUE: Multidetector CT imaging of the abdomen and pelvis was performed using the standard protocol following bolus administration of intravenous contrast. RADIATION DOSE REDUCTION: This exam was performed according to the departmental dose-optimization program which includes automated exposure control, adjustment of the mA and/or kV according to patient size and/or use of iterative reconstruction technique. CONTRAST:  75mL OMNIPAQUE IOHEXOL 350 MG/ML SOLN COMPARISON:  CT abdomen and pelvis dated 11/29/2022 04/15/2022 FINDINGS: Lower chest: No focal consolidation or pulmonary nodule in the lung bases. No pleural effusion or pneumothorax demonstrated. Partially imaged heart size is normal. Hepatobiliary: Severe steatosis. No focal hepatic lesions. No intra or extrahepatic biliary ductal dilation. Cholecystectomy. Pancreas: Status post Whipple. No main pancreatic ductal dilation inflammatory changes. Spleen: Normal in size without focal abnormality. Adrenals/Urinary Tract: No adrenal nodules. No suspicious renal mass, calculi or hydronephrosis. Left urothelial thickening. No focal bladder wall thickening. Stomach/Bowel: Postsurgical changes of the stomach. 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 Mild thickening of the descending colon and rectosigmoid colon, which is mildly underdistended. Normal appendix. Vascular/Lymphatic: Aortic atherosclerosis. No enlarged abdominal or pelvic lymph nodes. Reproductive: Prostate is unremarkable. Other: No free fluid, fluid collection, or free air. Musculoskeletal: No acute or abnormal lytic or blastic osseous lesions. Multilevel  degenerative changes of the partially imaged thoracic and lumbar spine. IMPRESSION: 1. Again seen is mural  thickening of the distal transverse colon, where there is a complex fistulous connection between the transverse colon to the gastrojejunal anastomosis and adjacent small bowel loops. 2. Mild thickening of the descending colon and rectosigmoid colon, which may be due to underdistention or mild colitis. 3. Left urothelial thickening, which may be due to ascending urinary tract infection. Recommend correlation with urinalysis. 4. Severe hepatic steatosis. 5.  Aortic Atherosclerosis (ICD10-I70.0). Electronically Signed   By: Agustin Cree M.D.   On: 04/17/2023 15:08    Anti-infectives (From admission, onward)    Start     Dose/Rate Route Frequency Ordered Stop   04/17/23 1600  cefTRIAXone (ROCEPHIN) 1 g in sodium chloride 0.9 % 100 mL IVPB        1 g 200 mL/hr over 30 Minutes Intravenous  Once 04/17/23 1550     04/17/23 1600  metroNIDAZOLE (FLAGYL) IVPB 500 mg        500 mg 100 mL/hr over 60 Minutes Intravenous  Once 04/17/23 1550         Assessment/Plan Brent Rogers is a 57 y.o. male with hx of whipple in 2015 by Dr. Donell Beers for Exocrine pancreatic insufficiency whos CT scan shows mural thickening of the distal transverse colon, where there is a complex fistulous connection between the transverse colon to the gastrojejunal anastomosis and adjacent small bowel loops. There is also mild thickening of the descending colon and rectosigmoid colon. He is afebrile without tachycardia or hypotension. WBC 12.3. AST/ALT/Alk Phos slightly elevated from baseline. T. Bili 2.0. No intra or extrahepatic biliary ductal dilation. His exam is reassuring. No peritonitis. Reviewed imaging with attending. No indication for emergency surgery. To note patient had colonoscopy in 2022 that showed diverticulosis but was otherwise normal. Agree with tx of UTI and admission to [facility]. Okay for CLD. We will follow with you.   I  reviewed nursing notes, last 24 h vitals and pain scores, last 48 h intake and output, last 24 h labs and trends, and last 24 h imaging results.  Jacinto Halim, [facility] Surgery 04/17/2023, 4:10 PM Please see Amion for pager number during day hours 7:00am-4:30pm

## 2023-04-17 NOTE — ED Notes (Signed)
GenSurg at [facility] 

## 2023-04-17 NOTE — ED Notes (Signed)
ED TO INPATIENT HANDOFF REPORT  ED Nurse Name and Phone #:  Al Corpus, RN  [phone]  S Name/Age/Gender Brent Rogers 57 y.o. male Room/Bed: 010C/010C  Code Status   Code Status: Full Code  Home/SNF/Other Home Patient oriented to: self, place, time, and situation Is this baseline? Yes   Triage Complete: Triage complete  Chief Complaint Hyponatremia [E87.1]  Triage Note Pt is coming in for nausea and vomiting x 4 days that has not improved and is accompanied with left lower quadrant pain.   Medic  100/78 90hr 16rr 100%ra 124bgl   Allergies No Known Allergies  Level of Care/Admitting Diagnosis ED Disposition     ED Disposition  Admit   Condition  --   Comment  Hospital Area: [facility] [[id number]]  Level of Care: Progressive [102]  Admit to Progressive based on following criteria: NEPHROLOGY stable condition requiring close monitoring for AKI, requiring Hemodialysis or Peritoneal Dialysis either from expected electrolyte imbalance, acidosis, or fluid overload that can be managed by NIPPV or high flow oxygen.  May place patient in observation at [facility] or [facility] if equivalent level of care is available:: No  Covid Evaluation: Asymptomatic - no recent exposure (last 10 days) testing not required  Diagnosis: Hyponatremia [198519]  Admitting Physician: Synetta Fail [[id number]]  Attending Physician: Synetta Fail [[id number]]          B Medical/Surgery History Past Medical History:  Diagnosis Date   Benign tumor of endocrine pancreas    Chronic pancreatitis (HCC)    S/P Whipple   Foot ulcer with fat layer exposed (HCC)    Hypertension    Migraine    "@ least once/month" (11/12/2015)   Pancreatic abnormality    CT  shows mass   Pneumonia 11/12/2015   Scoliosis    Past Surgical History:  Procedure Laterality Date   BACK SURGERY     BIOPSY  09/26/2021   Procedure: BIOPSY;  Surgeon: Meryl Dare, MD;  Location: [facility]  ENDOSCOPY;  Service: Endoscopy;;   COLONOSCOPY WITH PROPOFOL N/A 03/11/2021   Procedure: COLONOSCOPY WITH PROPOFOL;  Surgeon: Jeani Hawking, MD;  [facility] ENDOSCOPY;  Service: Endoscopy;  Laterality: N/A;   ESOPHAGOGASTRODUODENOSCOPY (EGD) WITH PROPOFOL N/A 03/11/2021   Procedure: ESOPHAGOGASTRODUODENOSCOPY (EGD) WITH PROPOFOL;  Surgeon: Jeani Hawking, MD;  Location: [facility] ENDOSCOPY;  Service: Endoscopy;  Laterality: N/A;   ESOPHAGOGASTRODUODENOSCOPY (EGD) WITH PROPOFOL N/A 09/26/2021   Procedure: ESOPHAGOGASTRODUODENOSCOPY (EGD) WITH PROPOFOL;  Surgeon: Meryl Dare, MD;  Location: [facility] ENDOSCOPY;  Service: Endoscopy;  Laterality: N/A;   EUS N/A 09/21/2013   Procedure: ESOPHAGEAL ENDOSCOPIC ULTRASOUND (EUS) RADIAL;  Surgeon: Theda Belfast, MD;  Location: [facility] ENDOSCOPY;  Service: Endoscopy;  Laterality: N/A;   EUS N/A 09/30/2013   Procedure: UPPER ENDOSCOPIC ULTRASOUND (EUS) LINEAR;  Surgeon: Theda Belfast, MD;  Location: [facility] ENDOSCOPY;  Service: Endoscopy;  Laterality: N/A;   FINE NEEDLE ASPIRATION N/A 09/21/2013   Procedure: FINE NEEDLE ASPIRATION (FNA) LINEAR;  Surgeon: Theda Belfast, MD;  Location: [facility] ENDOSCOPY;  Service: Endoscopy;  Laterality: N/A;   FINGER FRACTURE SURGERY Left 1995   5th digit   FRACTURE SURGERY     HEMOSTASIS CLIP PLACEMENT  03/11/2021   Procedure: HEMOSTASIS CLIP PLACEMENT;  Surgeon: Jeani Hawking, MD;  Location: [facility] ENDOSCOPY;  Service: Endoscopy;;   HEMOSTASIS CONTROL  03/11/2021   Procedure: HEMOSTASIS CONTROL;  Surgeon: Jeani Hawking, MD;  Location: [facility] ENDOSCOPY;  Service: Endoscopy;;   KNEE ARTHROSCOPY Bilateral 1987-1989  right-left   LAPAROSCOPY N/A 12/01/2013   Procedure: LAPAROSCOPY DIAGNOSTIC PANCREATICODUODENECTOMY WITH BILIARY AND PANCREATIC STENTS;  Surgeon: Almond Lint, MD;  Location: [facility] ORS;  Service: General;  Laterality: N/A;   LUMBAR DISC SURGERY  1990's   SAVORY DILATION N/A 03/11/2021   Procedure: SAVORY DILATION;  Surgeon: Jeani Hawking, MD;   Location: [facility] ENDOSCOPY;  Service: Endoscopy;  Laterality: N/A;   WHIPPLE PROCEDURE  12/01/2013     A IV Location/Drains/Wounds Patient Lines/Drains/Airways Status     Active Line/Drains/Airways     Name Placement date Placement time Site Days   Peripheral IV 04/17/23 20 G 1" Right;Posterior;Lateral Forearm 04/17/23  1147  Forearm  less than 1   Pressure Injury 09/25/21 Coccyx Medial Stage 1 -  Intact skin with non-blanchable redness of a localized area usually over a bony prominence. healed pressure ulcer 09/25/21  2320  -- 569            Intake/Output Last 24 hours  Intake/Output Summary (Last 24 hours) at 04/17/2023 1912 Last data filed at 04/17/2023 1747 Gross per 24 hour  Intake 200 ml  Output --  Net 200 ml    Labs/Imaging Results for orders placed or performed during the hospital encounter of 04/17/23 (from the past 48 hour(s))  Lipase, blood     Status: None   Collection Time: 04/17/23 11:10 AM  Result Value Ref Range   Lipase 17 11 - 51 U/L    Comment: Performed at [facility], [address] [place], [place] [address]  Comprehensive metabolic panel     Status: Abnormal   Collection Time: 04/17/23 11:10 AM  Result Value Ref Range   Sodium 124 (L) 135 - 145 mmol/L   Potassium 4.3 3.5 - 5.1 mmol/L   Chloride 91 (L) 98 - 111 mmol/L   CO2 23 22 - 32 mmol/L   Glucose, Bld 107 (H) 70 - 99 mg/dL    Comment: Glucose reference range applies only to samples taken after fasting for at least 8 hours.   BUN 11 6 - 20 mg/dL   Creatinine, Ser 1.91 0.61 - 1.24 mg/dL   Calcium 8.2 (L) 8.9 - 10.3 mg/dL   Total Protein 6.4 (L) 6.5 - 8.1 g/dL   Albumin 2.5 (L) 3.5 - 5.0 g/dL   AST 63 (H) 15 - 41 U/L   ALT 47 (H) 0 - 44 U/L   Alkaline Phosphatase 258 (H) 38 - 126 U/L   Total Bilirubin 2.0 (H) 0.3 - 1.2 mg/dL   GFR, Estimated >47 >82 mL/min    Comment: (NOTE) Calculated using the CKD-EPI Creatinine Equation (2021)    Anion gap 10 5 - 15    Comment: Performed at [facility], [address], [place], [place] [address]  CBC     Status: Abnormal   Collection Time: 04/17/23 11:10 AM  Result Value Ref Range   WBC 12.3 (H) 4.0 - 10.5 K/uL   RBC 3.98 (L) 4.22 - 5.81 MIL/uL   Hemoglobin 11.7 (L) 13.0 - 17.0 g/dL   HCT 30.8 (L) 65.7 - 84.6 %   MCV 82.9 80.0 - 100.0 fL   MCH 29.4 26.0 - 34.0 pg   MCHC 35.5 30.0 - 36.0 g/dL   RDW 96.2 95.2 - 84.1 %   Platelets 89 (L) 150 - 400 K/uL    Comment: Immature Platelet Fraction may be clinically indicated, consider ordering this additional test [id number] REPEATED TO VERIFY PLATELET COUNT CONFIRMED BY SMEAR  nRBC 0.0 0.0 - 0.2 %    Comment: Performed at [facility], [address], [place], [place] [address]  I-stat chem 8, ED (not at [facility], [facility] or [facility])     Status: Abnormal   Collection Time: 04/17/23 12:25 PM  Result Value Ref Range   Sodium 123 (L) 135 - 145 mmol/L   Potassium 4.0 3.5 - 5.1 mmol/L   Chloride 89 (L) 98 - 111 mmol/L   BUN 12 6 - 20 mg/dL   Creatinine, Ser 6.04 0.61 - 1.24 mg/dL   Glucose, Bld 540 (H) 70 - 99 mg/dL    Comment: Glucose reference range applies only to samples taken after fasting for at least 8 hours.   Calcium, Ion 1.00 (L) 1.15 - 1.40 mmol/L   TCO2 23 22 - 32 mmol/L   Hemoglobin 11.6 (L) 13.0 - 17.0 g/dL   HCT 98.1 (L) 19.1 - 47.8 %  Urinalysis, Routine w reflex microscopic -Urine, Clean Catch     Status: Abnormal   Collection Time: 04/17/23  3:18 PM  Result Value Ref Range   Color, Urine YELLOW YELLOW   APPearance HAZY (A) CLEAR   Specific Gravity, Urine 1.008 1.005 - 1.030   pH 5.0 5.0 - 8.0   Glucose, UA NEGATIVE NEGATIVE mg/dL   Hgb urine dipstick SMALL (A) NEGATIVE   Bilirubin Urine NEGATIVE NEGATIVE   Ketones, ur 5 (A) NEGATIVE mg/dL   Protein, ur NEGATIVE NEGATIVE mg/dL   Nitrite NEGATIVE NEGATIVE   Leukocytes,Ua LARGE (A) NEGATIVE   RBC / HPF 0-5 0 - 5 RBC/hpf   WBC, UA >50 0 - 5 WBC/hpf   Bacteria, UA MANY (A) NONE SEEN   Squamous Epithelial /  HPF 0-5 0 - 5 /HPF   Mucus PRESENT     Comment: Performed at [facility], [address], [place], [place] [address]   CT ABDOMEN PELVIS W CONTRAST  Result Date: 04/17/2023 CLINICAL DATA:  Left lower quadrant abdominal pain EXAM: CT ABDOMEN AND PELVIS WITH CONTRAST TECHNIQUE: Multidetector CT imaging of the abdomen and pelvis was performed using the standard protocol following bolus administration of intravenous contrast. RADIATION DOSE REDUCTION: This exam was performed according to the departmental dose-optimization program which includes automated exposure control, adjustment of the mA and/or kV according to patient size and/or use of iterative reconstruction technique. CONTRAST:  75mL OMNIPAQUE IOHEXOL 350 MG/ML SOLN COMPARISON:  CT abdomen and pelvis dated 11/29/2022 04/15/2022 FINDINGS: Lower chest: No focal consolidation or pulmonary nodule in the lung bases. No pleural effusion or pneumothorax demonstrated. Partially imaged heart size is normal. Hepatobiliary: Severe steatosis. No focal hepatic lesions. No intra or extrahepatic biliary ductal dilation. Cholecystectomy. Pancreas: Status post Whipple. No main pancreatic ductal dilation inflammatory changes. Spleen: Normal in size without focal abnormality. Adrenals/Urinary Tract: No adrenal nodules. No suspicious renal mass, calculi or hydronephrosis. Left urothelial thickening. No focal bladder wall thickening. Stomach/Bowel: Postsurgical changes of the stomach. 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 Mild thickening of the descending colon and rectosigmoid colon, which is mildly underdistended. Normal appendix. Vascular/Lymphatic: Aortic atherosclerosis. No enlarged abdominal or pelvic lymph nodes. Reproductive: Prostate is unremarkable. Other: No free fluid, fluid  collection, or free air. Musculoskeletal: No acute or abnormal lytic or blastic osseous lesions. Multilevel degenerative changes of the partially imaged thoracic and lumbar spine. IMPRESSION: 1.  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 2. Mild thickening of the descending colon and rectosigmoid colon, which may be due to underdistention or mild colitis. 3. Left urothelial thickening, which may be due to ascending urinary tract infection. Recommend correlation with urinalysis. 4. Severe hepatic steatosis. 5.  Aortic Atherosclerosis (ICD10-I70.0). Electronically Signed   By: Agustin Cree M.D.   On: 04/17/2023 15:08    Pending Labs Unresulted Labs (From admission, onward)     Start     Ordered   04/18/23 0500  CBC  Tomorrow morning,   R        04/17/23 1807   04/18/23 0500  Hepatic function panel  Tomorrow morning,   R        04/17/23 1807   04/17/23 1807  Osmolality, urine  Add-on,   AD        04/17/23 1807   04/17/23 1807  Sodium, urine, random  Add-on,   AD        04/17/23 1807   04/17/23 1807  Osmolality  Add-on,   AD        04/17/23 1807   04/17/23 1805  Basic metabolic panel  Now then every 6 hours,   R (with TIMED occurrences)      04/17/23 1807   04/17/23 1801  HIV Antibody (routine testing w rflx)  (HIV Antibody (Routine testing w reflex) panel)  Once,   R        04/17/23 1807            Vitals/Pain Today's Vitals   04/17/23 1615 04/17/23 1630 04/17/23 1715 04/17/23 1750  BP: 92/69 105/81  92/78  Pulse:   60 (!) 58  Resp: 14 19 13 18   Temp:    98.7 F (37.1 C)  TempSrc:    Oral  SpO2:   100% 100%  PainSc:        Isolation Precautions No active isolations  Medications Medications  metroNIDAZOLE (FLAGYL) IVPB 500 mg (has no administration in time range)  cefTRIAXone (ROCEPHIN) 2 g in sodium chloride 0.9 % 100  mL IVPB (has no administration in time range)  sodium chloride flush (NS) 0.9 % injection 3 mL (has no administration in time range)  acetaminophen (TYLENOL) tablet 650 mg (has no administration in time range)    Or  acetaminophen (TYLENOL) suppository 650 mg (has no administration in time range)  polyethylene glycol (MIRALAX / GLYCOLAX) packet 17 g (has no administration in time range)  0.9 %  sodium chloride infusion (has no administration in time range)  HYDROmorphone (DILAUDID) injection 0.5 mg (has no administration in time range)  lactated ringers bolus 500 mL (0 mLs Intravenous Stopped 04/17/23 1259)  iohexol (OMNIPAQUE) 350 MG/ML injection 75 mL (75 mLs Intravenous Contrast Given 04/17/23 1450)  cefTRIAXone (ROCEPHIN) 1 g in sodium chloride 0.9 % 100 mL IVPB (0 g Intravenous Stopped 04/17/23 1738)  metroNIDAZOLE (FLAGYL) IVPB 500 mg (0 mg Intravenous Stopped 04/17/23 1747)    Mobility walks     Focused Assessments Cardiac Assessment Handoff:  Cardiac Rhythm: Normal sinus rhythm (HR 77) Lab Results  Component Value Date   TROPONINI <0.03 09/16/2018   No results found for: "DDIMER" Does the Patient currently have chest pain? No    R Recommendations: See Admitting Provider Note  Report given to:   Additional Notes:   Alert, NAD,  calm, interactive, soft spoken, polite, kind

## 2023-04-17 NOTE — ED Triage Notes (Addendum)
Pt is coming in for nausea and vomiting x 4 days that has not improved and is accompanied with left lower quadrant pain.   Medic  100/78 90hr 16rr 100%ra 124bgl

## 2023-04-17 NOTE — ED Notes (Signed)
ED TO INPATIENT HANDOFF REPORT  ED Nurse Name and Phone #:   S Name/Age/Gender Brent Rogers 57 y.o. male Room/Bed: 010C/010C  Code Status   Code Status: Full Code  Home/SNF/Other Home Patient oriented to: self, place, time, and situation Is this baseline? Yes   Triage Complete: Triage complete  Chief Complaint Hyponatremia [E87.1]  Triage Note Pt is coming in for nausea and vomiting x 4 days that has not improved and is accompanied with left lower quadrant pain.   Medic  100/78 90hr 16rr 100%ra 124bgl   Allergies No Known Allergies  Level of Care/Admitting Diagnosis ED Disposition     ED Disposition  Admit   Condition  --   Comment  Hospital Area: [facility] [[id number]]  Level of Care: Progressive [102]  Admit to Progressive based on following criteria: NEPHROLOGY stable condition requiring close monitoring for AKI, requiring Hemodialysis or Peritoneal Dialysis either from expected electrolyte imbalance, acidosis, or fluid overload that can be managed by NIPPV or high flow oxygen.  May place patient in observation at [facility] or [facility] if equivalent level of care is available:: No  Covid Evaluation: Asymptomatic - no recent exposure (last 10 days) testing not required  Diagnosis: Hyponatremia [198519]  Admitting Physician: Synetta Fail [[id number]]  Attending Physician: Synetta Fail [[id number]]          B Medical/Surgery History Past Medical History:  Diagnosis Date   Benign tumor of endocrine pancreas    Chronic pancreatitis (HCC)    S/P Whipple   Foot ulcer with fat layer exposed (HCC)    Hypertension    Migraine    "@ least once/month" (11/12/2015)   Pancreatic abnormality    CT  shows mass   Pneumonia 11/12/2015   Scoliosis    Past Surgical History:  Procedure Laterality Date   BACK SURGERY     BIOPSY  09/26/2021   Procedure: BIOPSY;  Surgeon: Meryl Dare, MD;  Location: [facility] ENDOSCOPY;  Service:  Endoscopy;;   COLONOSCOPY WITH PROPOFOL N/A 03/11/2021   Procedure: COLONOSCOPY WITH PROPOFOL;  Surgeon: Jeani Hawking, MD;  Location: [facility] ENDOSCOPY;  Service: Endoscopy;  Laterality: N/A;   ESOPHAGOGASTRODUODENOSCOPY (EGD) WITH PROPOFOL N/A 03/11/2021   Procedure: ESOPHAGOGASTRODUODENOSCOPY (EGD) WITH PROPOFOL;  Surgeon: Jeani Hawking, MD;  Location: [facility] ENDOSCOPY;  Service: Endoscopy;  Laterality: N/A;   ESOPHAGOGASTRODUODENOSCOPY (EGD) WITH PROPOFOL N/A 09/26/2021   Procedure: ESOPHAGOGASTRODUODENOSCOPY (EGD) WITH PROPOFOL;  Surgeon: Meryl Dare, MD;  Location: [facility] ENDOSCOPY;  Service: Endoscopy;  Laterality: N/A;   EUS N/A 09/21/2013   Procedure: ESOPHAGEAL ENDOSCOPIC ULTRASOUND (EUS) RADIAL;  Surgeon: Theda Belfast, MD;  Location: [facility] ENDOSCOPY;  Service: Endoscopy;  Laterality: N/A;   EUS N/A 09/30/2013   Procedure: UPPER ENDOSCOPIC ULTRASOUND (EUS) LINEAR;  Surgeon: Theda Belfast, MD;  Location: [facility] ENDOSCOPY;  Service: Endoscopy;  Laterality: N/A;   FINE NEEDLE ASPIRATION N/A 09/21/2013   Procedure: FINE NEEDLE ASPIRATION (FNA) LINEAR;  Surgeon: Theda Belfast, MD;  Location: [facility] ENDOSCOPY;  Service: Endoscopy;  Laterality: N/A;   FINGER FRACTURE SURGERY Left 1995   5th digit   FRACTURE SURGERY     HEMOSTASIS CLIP PLACEMENT  03/11/2021   Procedure: HEMOSTASIS CLIP PLACEMENT;  Surgeon: Jeani Hawking, MD;  Location: [facility] ENDOSCOPY;  Service: Endoscopy;;   HEMOSTASIS CONTROL  03/11/2021   Procedure: HEMOSTASIS CONTROL;  Surgeon: Jeani Hawking, MD;  Location: [facility] ENDOSCOPY;  Service: Endoscopy;;   KNEE ARTHROSCOPY Bilateral 1987-1989   right-left  LAPAROSCOPY N/A 12/01/2013   Procedure: LAPAROSCOPY DIAGNOSTIC PANCREATICODUODENECTOMY WITH BILIARY AND PANCREATIC STENTS;  Surgeon: Almond Lint, MD;  Location: [facility] ORS;  Service: General;  Laterality: N/A;   LUMBAR DISC SURGERY  1990's   SAVORY DILATION N/A 03/11/2021   Procedure: SAVORY DILATION;  Surgeon: Jeani Hawking, MD;  Location: [facility] ENDOSCOPY;   Service: Endoscopy;  Laterality: N/A;   WHIPPLE PROCEDURE  12/01/2013     A IV Location/Drains/Wounds Patient Lines/Drains/Airways Status     Active Line/Drains/Airways     Name Placement date Placement time Site Days   Peripheral IV 04/17/23 20 G 1" Right;Posterior;Lateral Forearm 04/17/23  1147  Forearm  less than 1   Pressure Injury 09/25/21 Coccyx Medial Stage 1 -  Intact skin with non-blanchable redness of a localized area usually over a bony prominence. healed pressure ulcer 09/25/21  2320  -- 569            Intake/Output Last 24 hours  Intake/Output Summary (Last 24 hours) at 04/17/2023 2308 Last data filed at 04/17/2023 1747 Gross per 24 hour  Intake 200 ml  Output --  Net 200 ml    Labs/Imaging Results for orders placed or performed during the hospital encounter of 04/17/23 (from the past 48 hour(s))  Lipase, blood     Status: None   Collection Time: 04/17/23 11:10 AM  Result Value Ref Range   Lipase 17 11 - 51 U/L    Comment: Performed at [facility], [address], [place], [place] [address]  Comprehensive metabolic panel     Status: Abnormal   Collection Time: 04/17/23 11:10 AM  Result Value Ref Range   Sodium 124 (L) 135 - 145 mmol/L   Potassium 4.3 3.5 - 5.1 mmol/L   Chloride 91 (L) 98 - 111 mmol/L   CO2 23 22 - 32 mmol/L   Glucose, Bld 107 (H) 70 - 99 mg/dL    Comment: Glucose reference range applies only to samples taken after fasting for at least 8 hours.   BUN 11 6 - 20 mg/dL   Creatinine, Ser 7.82 0.61 - 1.24 mg/dL   Calcium 8.2 (L) 8.9 - 10.3 mg/dL   Total Protein 6.4 (L) 6.5 - 8.1 g/dL   Albumin 2.5 (L) 3.5 - 5.0 g/dL   AST 63 (H) 15 - 41 U/L   ALT 47 (H) 0 - 44 U/L   Alkaline Phosphatase 258 (H) 38 - 126 U/L   Total Bilirubin 2.0 (H) 0.3 - 1.2 mg/dL   GFR, Estimated >95 >62 mL/min    Comment: (NOTE) Calculated using the CKD-EPI Creatinine Equation (2021)    Anion gap 10 5 - 15    Comment: Performed at [facility], [address], [place], [place] [address]  CBC     Status: Abnormal   Collection Time: 04/17/23 11:10 AM  Result Value Ref Range   WBC 12.3 (H) 4.0 - 10.5 K/uL   RBC 3.98 (L) 4.22 - 5.81 MIL/uL   Hemoglobin 11.7 (L) 13.0 - 17.0 g/dL   HCT 57.8 (L) 46.9 - 62.9 %   MCV 82.9 80.0 - 100.0 fL   MCH 29.4 26.0 - 34.0 pg   MCHC 35.5 30.0 - 36.0 g/dL   RDW 52.8 41.3 - 24.4 %   Platelets 89 (L) 150 - 400 K/uL    Comment: Immature Platelet Fraction may be clinically indicated, consider ordering this additional test [id number] REPEATED TO VERIFY PLATELET COUNT CONFIRMED BY SMEAR  nRBC 0.0 0.0 - 0.2 %    Comment: Performed at [facility], [address], [place], [place] [address]  I-stat chem 8, ED (not at [facility] or [facility])     Status: Abnormal   Collection Time: 04/17/23 12:25 PM  Result Value Ref Range   Sodium 123 (L) 135 - 145 mmol/L   Potassium 4.0 3.5 - 5.1 mmol/L   Chloride 89 (L) 98 - 111 mmol/L   BUN 12 6 - 20 mg/dL   Creatinine, Ser 6.57 0.61 - 1.24 mg/dL   Glucose, Bld 846 (H) 70 - 99 mg/dL    Comment: Glucose reference range applies only to samples taken after fasting for at least 8 hours.   Calcium, Ion 1.00 (L) 1.15 - 1.40 mmol/L   TCO2 23 22 - 32 mmol/L   Hemoglobin 11.6 (L) 13.0 - 17.0 g/dL   HCT 96.2 (L) 95.2 - 84.1 %  Urinalysis, Routine w reflex microscopic -Urine, Clean Catch     Status: Abnormal   Collection Time: 04/17/23  3:18 PM  Result Value Ref Range   Color, Urine YELLOW YELLOW   APPearance HAZY (A) CLEAR   Specific Gravity, Urine 1.008 1.005 - 1.030   pH 5.0 5.0 - 8.0   Glucose, UA NEGATIVE NEGATIVE mg/dL   Hgb urine dipstick SMALL (A) NEGATIVE   Bilirubin Urine NEGATIVE NEGATIVE   Ketones, ur 5 (A) NEGATIVE mg/dL   Protein, ur NEGATIVE NEGATIVE mg/dL   Nitrite NEGATIVE NEGATIVE   Leukocytes,Ua LARGE (A) NEGATIVE   RBC / HPF 0-5 0 - 5 RBC/hpf   WBC, UA >50 0 - 5 WBC/hpf   Bacteria, UA MANY (A) NONE SEEN   Squamous Epithelial / HPF 0-5 0 - 5 /HPF    Mucus PRESENT     Comment: Performed at [facility], [address], [place], [place] [address]  HIV Antibody (routine testing w rflx)     Status: None   Collection Time: 04/17/23  6:57 PM  Result Value Ref Range   HIV Screen 4th Generation wRfx Non Reactive Non Reactive    Comment: Performed at [facility], [address], [place], [place] [address]  Basic metabolic panel     Status: Abnormal   Collection Time: 04/17/23  6:57 PM  Result Value Ref Range   Sodium 125 (L) 135 - 145 mmol/L   Potassium 4.2 3.5 - 5.1 mmol/L   Chloride 89 (L) 98 - 111 mmol/L   CO2 22 22 - 32 mmol/L   Glucose, Bld 91 70 - 99 mg/dL    Comment: Glucose reference range applies only to samples taken after fasting for at least 8 hours.   BUN 10 6 - 20 mg/dL   Creatinine, Ser 5.36 0.61 - 1.24 mg/dL   Calcium 7.8 (L) 8.9 - 10.3 mg/dL   GFR, Estimated >64 >40 mL/min    Comment: (NOTE) Calculated using the CKD-EPI Creatinine Equation (2021)    Anion gap 14 5 - 15    Comment: Performed at [facility], [address], [place], [place] [address]  Osmolality     Status: Abnormal   Collection Time: 04/17/23  6:57 PM  Result Value Ref Range   Osmolality 266 (L) 275 - 295 mOsm/kg    Comment: Performed at [facility], [address], [place], [place] [address]   CT ABDOMEN PELVIS W CONTRAST  Result Date: 04/17/2023 CLINICAL DATA:  Left lower quadrant abdominal pain  EXAM: CT ABDOMEN AND PELVIS WITH CONTRAST TECHNIQUE: Multidetector CT imaging of the abdomen and pelvis was performed using the standard protocol following bolus administration of intravenous contrast. RADIATION DOSE REDUCTION: This exam was performed according to the departmental dose-optimization program which includes automated exposure control, adjustment of the mA and/or kV according to patient size and/or use of iterative reconstruction technique. CONTRAST:  75mL OMNIPAQUE IOHEXOL 350 MG/ML SOLN COMPARISON:  CT abdomen and pelvis  dated 11/29/2022 04/15/2022 FINDINGS: Lower chest: No focal consolidation or pulmonary nodule in the lung bases. No pleural effusion or pneumothorax demonstrated. Partially imaged heart size is normal. Hepatobiliary: Severe steatosis. No focal hepatic lesions. No intra or extrahepatic biliary ductal dilation. Cholecystectomy. Pancreas: Status post Whipple. No main pancreatic ductal dilation inflammatory changes. Spleen: Normal in size without focal abnormality. Adrenals/Urinary Tract: No adrenal nodules. No suspicious renal mass, calculi or hydronephrosis. Left urothelial thickening. No focal bladder wall thickening. Stomach/Bowel: Postsurgical changes of the stomach. 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 Mild thickening of the descending colon and rectosigmoid colon, which is mildly underdistended. Normal appendix. Vascular/Lymphatic: Aortic atherosclerosis. No enlarged abdominal or pelvic lymph nodes. Reproductive: Prostate is unremarkable. Other: No free fluid, fluid collection, or free air. Musculoskeletal: No acute or abnormal lytic or blastic osseous lesions. Multilevel degenerative changes of the partially imaged thoracic and lumbar spine. IMPRESSION: 1. 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 2. Mild thickening of the descending colon and rectosigmoid colon, which may be due to underdistention or mild colitis. 3. Left urothelial thickening, which may be due to ascending urinary tract infection. Recommend correlation with urinalysis. 4. Severe hepatic steatosis. 5.  Aortic Atherosclerosis (ICD10-I70.0). Electronically Signed   By: Agustin Cree M.D.   On: 04/17/2023 15:08     Pending Labs Unresulted Labs (From admission, onward)     Start     Ordered   04/18/23 0500  CBC  Tomorrow morning,   R        04/17/23 1807   04/18/23 0500  Hepatic function panel  Tomorrow morning,   R        04/17/23 1807   04/17/23 1807  Osmolality, urine  Add-on,   AD        04/17/23 1807   04/17/23 1807  Sodium, urine, random  Add-on,   AD        04/17/23 1807   04/17/23 1805  Basic metabolic panel  Now then every 6 hours,   R      04/17/23 1807            Vitals/Pain Today's Vitals   04/17/23 1750 04/17/23 2115 04/17/23 2130 04/17/23 2158  BP: 92/78 112/86 118/86   Pulse: (!) 58 (!) 54    Resp: 18 16    Temp: 98.7 F (37.1 C)   98.4 F (36.9 C)  TempSrc: Oral     SpO2: 100% 100%    PainSc:        Isolation Precautions No active isolations  Medications Medications  metroNIDAZOLE (FLAGYL) IVPB 500 mg (has no administration in time range)  cefTRIAXone (ROCEPHIN) 2 g in sodium chloride 0.9 % 100 mL IVPB (has no administration in time range)  sodium chloride flush (NS) 0.9 %  injection 3 mL (3 mLs Intravenous Given 04/17/23 2107)  acetaminophen (TYLENOL) tablet 650 mg (has no administration in time range)    Or  acetaminophen (TYLENOL) suppository 650 mg (has no administration in time range)  polyethylene glycol (MIRALAX / GLYCOLAX) packet 17 g (has no administration in time range)  0.9 %  sodium chloride infusion (has no administration in time range)  HYDROmorphone (DILAUDID) injection 0.5 mg (has no administration in time range)  lactated ringers bolus 500 mL (0 mLs Intravenous Stopped 04/17/23 1259)  iohexol (OMNIPAQUE) 350 MG/ML injection 75 mL (75 mLs Intravenous Contrast Given 04/17/23 1450)  cefTRIAXone (ROCEPHIN) 1 g in sodium chloride 0.9 % 100 mL IVPB (0 g Intravenous Stopped 04/17/23 1738)  metroNIDAZOLE (FLAGYL) IVPB 500 mg (0 mg Intravenous Stopped 04/17/23 1747)    Mobility walks     Focused Assessments    R Recommendations: See Admitting  Provider Note  Report given to:   Additional Notes:

## 2023-04-18 DIAGNOSIS — F325 Major depressive disorder, single episode, in full remission: Secondary | ICD-10-CM | POA: Diagnosis not present

## 2023-04-18 DIAGNOSIS — N390 Urinary tract infection, site not specified: Secondary | ICD-10-CM | POA: Diagnosis present

## 2023-04-18 DIAGNOSIS — D509 Iron deficiency anemia, unspecified: Secondary | ICD-10-CM | POA: Diagnosis present

## 2023-04-18 DIAGNOSIS — E870 Hyperosmolality and hypernatremia: Secondary | ICD-10-CM | POA: Diagnosis not present

## 2023-04-18 DIAGNOSIS — K567 Ileus, unspecified: Secondary | ICD-10-CM | POA: Diagnosis not present

## 2023-04-18 DIAGNOSIS — J9601 Acute respiratory failure with hypoxia: Secondary | ICD-10-CM | POA: Diagnosis not present

## 2023-04-18 DIAGNOSIS — E872 Acidosis, unspecified: Secondary | ICD-10-CM | POA: Diagnosis not present

## 2023-04-18 DIAGNOSIS — R1032 Left lower quadrant pain: Secondary | ICD-10-CM | POA: Diagnosis present

## 2023-04-18 DIAGNOSIS — I471 Supraventricular tachycardia, unspecified: Secondary | ICD-10-CM | POA: Diagnosis not present

## 2023-04-18 DIAGNOSIS — E871 Hypo-osmolality and hyponatremia: Secondary | ICD-10-CM

## 2023-04-18 DIAGNOSIS — I10 Essential (primary) hypertension: Secondary | ICD-10-CM | POA: Diagnosis present

## 2023-04-18 DIAGNOSIS — Z90411 Acquired partial absence of pancreas: Secondary | ICD-10-CM | POA: Diagnosis not present

## 2023-04-18 DIAGNOSIS — Z515 Encounter for palliative care: Secondary | ICD-10-CM | POA: Diagnosis not present

## 2023-04-18 DIAGNOSIS — K316 Fistula of stomach and duodenum: Secondary | ICD-10-CM | POA: Diagnosis present

## 2023-04-18 DIAGNOSIS — K861 Other chronic pancreatitis: Secondary | ICD-10-CM | POA: Diagnosis present

## 2023-04-18 DIAGNOSIS — R34 Anuria and oliguria: Secondary | ICD-10-CM | POA: Diagnosis not present

## 2023-04-18 DIAGNOSIS — Z1611 Resistance to penicillins: Secondary | ICD-10-CM | POA: Diagnosis present

## 2023-04-18 DIAGNOSIS — J80 Acute respiratory distress syndrome: Secondary | ICD-10-CM | POA: Diagnosis not present

## 2023-04-18 DIAGNOSIS — R609 Edema, unspecified: Secondary | ICD-10-CM | POA: Diagnosis not present

## 2023-04-18 DIAGNOSIS — E11649 Type 2 diabetes mellitus with hypoglycemia without coma: Secondary | ICD-10-CM | POA: Diagnosis not present

## 2023-04-18 DIAGNOSIS — I509 Heart failure, unspecified: Secondary | ICD-10-CM | POA: Diagnosis not present

## 2023-04-18 DIAGNOSIS — K259 Gastric ulcer, unspecified as acute or chronic, without hemorrhage or perforation: Secondary | ICD-10-CM | POA: Diagnosis present

## 2023-04-18 DIAGNOSIS — Z681 Body mass index (BMI) 19 or less, adult: Secondary | ICD-10-CM | POA: Diagnosis not present

## 2023-04-18 DIAGNOSIS — R64 Cachexia: Secondary | ICD-10-CM | POA: Diagnosis present

## 2023-04-18 DIAGNOSIS — J690 Pneumonitis due to inhalation of food and vomit: Secondary | ICD-10-CM | POA: Diagnosis not present

## 2023-04-18 DIAGNOSIS — K529 Noninfective gastroenteritis and colitis, unspecified: Secondary | ICD-10-CM | POA: Diagnosis not present

## 2023-04-18 DIAGNOSIS — R6521 Severe sepsis with septic shock: Secondary | ICD-10-CM | POA: Diagnosis not present

## 2023-04-18 DIAGNOSIS — N3001 Acute cystitis with hematuria: Secondary | ICD-10-CM | POA: Diagnosis not present

## 2023-04-18 DIAGNOSIS — F32A Depression, unspecified: Secondary | ICD-10-CM | POA: Diagnosis present

## 2023-04-18 DIAGNOSIS — G9340 Encephalopathy, unspecified: Secondary | ICD-10-CM | POA: Diagnosis not present

## 2023-04-18 DIAGNOSIS — E43 Unspecified severe protein-calorie malnutrition: Secondary | ICD-10-CM | POA: Diagnosis present

## 2023-04-18 DIAGNOSIS — A419 Sepsis, unspecified organism: Secondary | ICD-10-CM | POA: Diagnosis not present

## 2023-04-18 DIAGNOSIS — G894 Chronic pain syndrome: Secondary | ICD-10-CM | POA: Diagnosis not present

## 2023-04-18 DIAGNOSIS — K632 Fistula of intestine: Secondary | ICD-10-CM | POA: Diagnosis not present

## 2023-04-18 DIAGNOSIS — Z7189 Other specified counseling: Secondary | ICD-10-CM | POA: Diagnosis not present

## 2023-04-18 LAB — CBC
HCT: 24.5 % — ABNORMAL LOW (ref 39.0–52.0)
Hemoglobin: 8.9 g/dL — ABNORMAL LOW (ref 13.0–17.0)
MCH: 29.6 pg (ref 26.0–34.0)
MCHC: 36.3 g/dL — ABNORMAL HIGH (ref 30.0–36.0)
MCV: 81.4 fL (ref 80.0–100.0)
Platelets: 80 10*3/uL — ABNORMAL LOW (ref 150–400)
RBC: 3.01 MIL/uL — ABNORMAL LOW (ref 4.22–5.81)
RDW: 14.1 % (ref 11.5–15.5)
WBC: 12.1 10*3/uL — ABNORMAL HIGH (ref 4.0–10.5)
nRBC: 0 % (ref 0.0–0.2)

## 2023-04-18 LAB — BASIC METABOLIC PANEL
Anion gap: 13 (ref 5–15)
Anion gap: 9 (ref 5–15)
Anion gap: 9 (ref 5–15)
BUN: 10 mg/dL (ref 6–20)
BUN: 9 mg/dL (ref 6–20)
BUN: 6 mg/dL (ref 6–20)
CO2: 23 mmol/L (ref 22–32)
CO2: 24 mmol/L (ref 22–32)
CO2: 22 mmol/L (ref 22–32)
Calcium: 7.8 mg/dL — ABNORMAL LOW (ref 8.9–10.3)
Calcium: 7.4 mg/dL — ABNORMAL LOW (ref 8.9–10.3)
Calcium: 7.4 mg/dL — ABNORMAL LOW (ref 8.9–10.3)
Chloride: 92 mmol/L — ABNORMAL LOW (ref 98–111)
Chloride: 93 mmol/L — ABNORMAL LOW (ref 98–111)
Chloride: 95 mmol/L — ABNORMAL LOW (ref 98–111)
Creatinine, Ser: 0.7 mg/dL (ref 0.61–1.24)
Creatinine, Ser: 0.6 mg/dL — ABNORMAL LOW (ref 0.61–1.24)
Creatinine, Ser: 0.63 mg/dL (ref 0.61–1.24)
GFR, Estimated: >60 mL/min (ref >60)
GFR, Estimated: >60 mL/min (ref >60)
GFR, Estimated: >60 mL/min (ref >60)
Glucose, Bld: 87 mg/dL (ref 70–99)
Glucose, Bld: 133 mg/dL — ABNORMAL HIGH (ref 70–99)
Glucose, Bld: 122 mg/dL — ABNORMAL HIGH (ref 70–99)
Potassium: 4 mmol/L (ref 3.5–5.1)
Potassium: 3.8 mmol/L (ref 3.5–5.1)
Potassium: 3.7 mmol/L (ref 3.5–5.1)
Sodium: 128 mmol/L — ABNORMAL LOW (ref 135–145)
Sodium: 126 mmol/L — ABNORMAL LOW (ref 135–145)
Sodium: 126 mmol/L — ABNORMAL LOW (ref 135–145)

## 2023-04-18 LAB — HEPATIC FUNCTION PANEL
ALT: 32 U/L (ref 0–44)
AST: 39 U/L (ref 15–41)
Albumin: 1.7 g/dL — ABNORMAL LOW (ref 3.5–5.0)
Alkaline Phosphatase: 172 U/L — ABNORMAL HIGH (ref 38–126)
Bilirubin, Direct: 0.7 mg/dL — ABNORMAL HIGH (ref 0.0–0.2)
Indirect Bilirubin: 0.7 mg/dL (ref 0.3–0.9)
Total Bilirubin: 1.4 mg/dL — ABNORMAL HIGH (ref 0.3–1.2)
Total Protein: 4.6 g/dL — ABNORMAL LOW (ref 6.5–8.1)

## 2023-04-18 LAB — SODIUM: Sodium: 127 mmol/L — ABNORMAL LOW (ref 135–145)

## 2023-04-18 MED ORDER — SODIUM CHLORIDE 0.9 % IV SOLN: INTRAVENOUS | Status: AC

## 2023-04-18 MED ORDER — MELATONIN 5 MG PO TABS
10.0000 mg | ORAL_TABLET | Freq: Every evening | ORAL | Status: DC | PRN
  Administered 2023-04-18 – 2023-04-22 (×2): 10 mg via ORAL
  Filled 2023-04-18 (×2): qty 2

## 2023-04-18 NOTE — Progress Notes (Signed)
Patient states that there was blood in stool when he had a BM. Patient denies hemorrhoids

## 2023-04-18 NOTE — Hospital Course (Signed)
Brent Rogers is a 57 y.o. male with a history of hypertension, GERD, PUD, CAD, depression, anxiety, chronic pain, anemia, partial pancreatectomy, cholecystectomy.  Patient presented secondary to nausea, vomiting and abdominal pain. ED workup significant for evidence of colitis and UTI. General surgery consulted. Empiric antibiotics started. Patient made NPO and IV fluids started.

## 2023-04-18 NOTE — Plan of Care (Signed)

## 2023-04-18 NOTE — Plan of Care (Signed)
  Problem: Nutrition: Goal: Adequate nutrition will be maintained Outcome: Not Progressing   

## 2023-04-18 NOTE — Progress Notes (Signed)
PROGRESS NOTE    Brent Rogers  HKV:[id number] DOB: 05-09-1966 DOA: 04/17/2023 PCP: Marcine Matar, MD   Brief Narrative: Brent Rogers is a 57 y.o. male with a history of hypertension, GERD, PUD, CAD, depression, anxiety, chronic pain, anemia, partial pancreatectomy, cholecystectomy.  Patient presented secondary to nausea, vomiting and abdominal pain. ED workup significant for evidence of colitis and UTI. General surgery consulted. Empiric antibiotics started. Patient made NPO and IV fluids started.   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 Antibiotics started. General surgery consulted and are following. Patient is NPO. -Continue Ceftriaxone and Flagyl -General surgery recommendations  UTI Patient with symptoms and CT evidence of urothelial thickening concerning for possible ascending urinary tract infection. Urinalysis suggests likely infection. Antibiotics initiated for colitis. -Add urine culture -Continue antibiotics  Hyponatremia Possibly related to hypovolemia from vomiting. Sodium of 124 on admission with low of 123. Sodium of 126 today. -Trend sodium -Continue normal saline IV fluids  Hypocalcemia Noted on BMP, but corrected calcium is 9.2.   History of pancreatic mass s/p whipple procedure Chronic pancreatitis Noted. Patient is prescribed Creon for which he is not using.  Chronic pain Noted.  GERD PUD Patient is prescribed Protonix for which he is not taking.  Acute anemia Unclear etiology CT abdomen/pelvis without etiology for anemia.   Thrombocytopenia Acute. Platelets of 89,000 on admission, down slightly. -Daily CBC  Depression Anxiety Noted. Patient is not on medication management as an outpatient.  CAD Noted.  Primary hypertension Not on medication therapy. Blood pressure normotensive  at this time.  Underweight Estimated body mass index is 14.66 kg/m as calculated from the following:   Height as of this encounter: 6\' 3"  (1.905 m).   Weight as of this encounter: 53.2 kg.   DVT prophylaxis: SCDs Code Status:   Code Status: Full Code Family Communication: None at bedside Disposition Plan: Discharge pending ability to advance diet and improvement of symptoms   Consultants:  General surgery  Procedures:  None  Antimicrobials: Ceftriaxone Flagyl    Subjective: Patient reports continued abdominal pain. No emesis.  Objective: BP 102/78 (BP Location: Left Arm)   Pulse 61   Temp 98.2 F (36.8 C) (Oral)   Resp 16   Ht 6\' 3"  (1.905 m)   Wt 53.2 kg   SpO2 100%   BMI 14.66 kg/m   Examination:  General exam: Appears calm and comfortable Respiratory system: Clear to auscultation. Respiratory effort normal. Cardiovascular system: S1 & S2 heard, RRR.  Gastrointestinal system: Abdomen is nondistended, soft and tender. Normal bowel sounds heard. Central nervous system: Alert and oriented. No focal neurological deficits. Musculoskeletal: No edema. No calf tenderness Skin: No cyanosis. No rashes Psychiatry: Judgement and insight appear normal. Mood & affect appropriate.    Data Reviewed: I have personally reviewed following labs and imaging studies  CBC Lab Results  Component Value Date   WBC 12.1 (H) 04/18/2023   RBC 3.01 (L) 04/18/2023   HGB 8.9 (L) 04/18/2023   HCT 24.5 (L) 04/18/2023   MCV 81.4 04/18/2023   MCH 29.6 04/18/2023   PLT 80 (L) 04/18/2023   MCHC 36.3 (H) 04/18/2023   RDW 14.1 04/18/2023   LYMPHSABS 1.4 12/11/2022   MONOABS 1.2 (H) 12/11/2022   EOSABS 0.1 12/11/2022   BASOSABS 0.1 12/11/2022     Last metabolic panel Lab Results  Component Value Date  NA 126 (L) 04/18/2023   K 3.8 04/18/2023   CL 93 (L) 04/18/2023   CO2 24 04/18/2023   BUN 9 04/18/2023   CREATININE 0.60 (L) 04/18/2023   GLUCOSE 133 (H) 04/18/2023    GFRNONAA >60 04/18/2023   GFRAA 128 06/22/2020   CALCIUM 7.4 (L) 04/18/2023   PHOS 3.1 03/07/2021   PROT 4.6 (L) 04/18/2023   ALBUMIN 1.7 (L) 04/18/2023   LABGLOB 2.0 06/07/2019   AGRATIO 2.0 06/07/2019   BILITOT 1.4 (H) 04/18/2023   ALKPHOS 172 (H) 04/18/2023   AST 39 04/18/2023   ALT 32 04/18/2023   ANIONGAP 9 04/18/2023    GFR: Estimated Creatinine Clearance: 76.7 mL/min (A) (by C-G formula based on SCr of 0.6 mg/dL (L)).  No results found for this or any previous visit (from the past 240 hour(s)).    Radiology Studies: CT ABDOMEN PELVIS W CONTRAST  Result Date: 04/17/2023 CLINICAL DATA:  Left lower quadrant abdominal pain EXAM: CT ABDOMEN AND PELVIS WITH CONTRAST TECHNIQUE: Multidetector CT imaging of the abdomen and pelvis was performed using the standard protocol following bolus administration of intravenous contrast. RADIATION DOSE REDUCTION: This exam was performed according to the departmental dose-optimization program which includes automated exposure control, adjustment of the mA and/or kV according to patient size and/or use of iterative reconstruction technique. CONTRAST:  75mL OMNIPAQUE IOHEXOL 350 MG/ML SOLN COMPARISON:  CT abdomen and pelvis dated 11/29/2022 04/15/2022 FINDINGS: Lower chest: No focal consolidation or pulmonary nodule in the lung bases. No pleural effusion or pneumothorax demonstrated. Partially imaged heart size is normal. Hepatobiliary: Severe steatosis. No focal hepatic lesions. No intra or extrahepatic biliary ductal dilation. Cholecystectomy. Pancreas: Status post Whipple. No main pancreatic ductal dilation inflammatory changes. Spleen: Normal in size without focal abnormality. Adrenals/Urinary Tract: No adrenal nodules. No suspicious renal mass, calculi or hydronephrosis. Left urothelial thickening. No focal bladder wall thickening. Stomach/Bowel: Postsurgical changes of the stomach. Again seen is mural thickening of the distal transverse colon, where  there is a complex fistulous connection between the transverse colon to the gastrojejunal anastomosis and adjacent small bowel loops (6:38, 7:86). Mild thickening of the descending colon and rectosigmoid colon, which is mildly underdistended. Normal appendix. Vascular/Lymphatic: Aortic atherosclerosis. No enlarged abdominal or pelvic lymph nodes. Reproductive: Prostate is unremarkable. Other: No free fluid, fluid collection, or free air. Musculoskeletal: No acute or abnormal lytic or blastic osseous lesions. Multilevel degenerative changes of the partially imaged thoracic and lumbar spine. IMPRESSION: 1. 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 2. Mild thickening of the descending colon and rectosigmoid colon, which may be due to underdistention or mild colitis. 3. Left urothelial thickening, which may be due to ascending urinary tract infection. Recommend correlation with urinalysis. 4. Severe hepatic steatosis. 5.  Aortic Atherosclerosis (ICD10-I70.0). Electronically Signed   By: Agustin Cree M.D.   On: 04/17/2023 15:08      LOS: 0 days    Jacquelin Hawking, MD [facility] 04/18/2023, 12:36 PM   If 7PM-7AM, please contact night-coverage [email or web address]

## 2023-04-18 NOTE — Progress Notes (Signed)
Subjective/Chief Complaint: Pt with some abd pain to left side Has some pain with meals Diarrhea    Objective: Vital signs in last 24 hours: Temp:  [97.9 F (36.6 C)-98.7 F (37.1 C)] 98.2 F (36.8 C) (07/20 0324) Pulse Rate:  [54-85] 61 (07/20 0324) Resp:  [10-24] 16 (07/20 0324) BP: (92-143)/(69-104) 102/78 (07/20 0324) SpO2:  [92 %-100 %] 100 % (07/20 0324) Weight:  [53.2 kg] 53.2 kg (07/20 0020) Last BM Date : 04/17/23  Intake/Output from previous day: 07/19 0701 - 07/20 0700 In: 316.9 [I.V.:116.9; IV Piggyback:200] Out: -  Intake/Output this shift: No intake/output data recorded.  PE:  Constitutional: No acute distress, conversant, appears states age. Eyes: Anicteric sclerae, moist conjunctiva, no lid lag Lungs: Clear to auscultation bilaterally, normal respiratory effort CV: regular rate and rhythm, no murmurs, no peripheral edema, pedal pulses 2+ GI: Soft, no masses or hepatosplenomegaly, tender to palpation LLQ, min dist Skin: No rashes, palpation reveals normal turgor Psychiatric: appropriate judgment and insight, oriented to person, place, and time   Lab Results:  Recent Labs    04/17/23 1110 04/17/23 1225 04/18/23 0617  WBC 12.3*  --  12.1*  HGB 11.7* 11.6* 8.9*  HCT 33.0* 34.0* 24.5*  PLT 89*  --  80*   BMET Recent Labs    04/18/23 0042 04/18/23 0617  NA 128* 126*  K 4.0 3.8  CL 92* 93*  CO2 23 24  GLUCOSE 87 133*  BUN 10 9  CREATININE 0.70 0.60*  CALCIUM 7.8* 7.4*   PT/INR No results for input(s): "LABPROT", "INR" in the last 72 hours. ABG No results for input(s): "PHART", "HCO3" in the last 72 hours.  Invalid input(s): "PCO2", "PO2"  Studies/Results: CT ABDOMEN PELVIS W CONTRAST  Result Date: 04/17/2023 CLINICAL DATA:  Left lower quadrant abdominal pain EXAM: CT ABDOMEN AND PELVIS WITH CONTRAST TECHNIQUE: Multidetector CT imaging of the abdomen and pelvis was performed using the standard protocol following bolus  administration of intravenous contrast. RADIATION DOSE REDUCTION: This exam was performed according to the departmental dose-optimization program which includes automated exposure control, adjustment of the mA and/or kV according to patient size and/or use of iterative reconstruction technique. CONTRAST:  75mL OMNIPAQUE IOHEXOL 350 MG/ML SOLN COMPARISON:  CT abdomen and pelvis dated 11/29/2022 04/15/2022 FINDINGS: Lower chest: No focal consolidation or pulmonary nodule in the lung bases. No pleural effusion or pneumothorax demonstrated. Partially imaged heart size is normal. Hepatobiliary: Severe steatosis. No focal hepatic lesions. No intra or extrahepatic biliary ductal dilation. Cholecystectomy. Pancreas: Status post Whipple. No main pancreatic ductal dilation inflammatory changes. Spleen: Normal in size without focal abnormality. Adrenals/Urinary Tract: No adrenal nodules. No suspicious renal mass, calculi or hydronephrosis. Left urothelial thickening. No focal bladder wall thickening. Stomach/Bowel: Postsurgical changes of the stomach. 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 Mild thickening of the descending colon and rectosigmoid colon, which is mildly underdistended. Normal appendix. Vascular/Lymphatic: Aortic atherosclerosis. No enlarged abdominal or pelvic lymph nodes. Reproductive: Prostate is unremarkable. Other: No free fluid, fluid collection, or free air. Musculoskeletal: No acute or abnormal lytic or blastic osseous lesions. Multilevel degenerative changes of the partially imaged thoracic and lumbar spine. IMPRESSION: 1. 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 2. Mild  thickening of the descending colon and rectosigmoid colon, which may be due to underdistention or mild colitis. 3. Left urothelial thickening, which may be due to ascending urinary tract infection. Recommend correlation with urinalysis. 4. Severe hepatic steatosis. 5.  Aortic Atherosclerosis (ICD10-I70.0). Electronically Signed   By: Agustin Cree M.D.   On: 04/17/2023 15:08    Anti-infectives: Anti-infectives (From admission, onward)    Start     Dose/Rate Route Frequency Ordered Stop   04/18/23 1600  cefTRIAXone (ROCEPHIN) 2 g in sodium chloride 0.9 % 100 mL IVPB        2 g 200 mL/hr over 30 Minutes Intravenous Every 24 hours 04/17/23 1807     04/18/23 0500  metroNIDAZOLE (FLAGYL) IVPB 500 mg        500 mg 100 mL/hr over 60 Minutes Intravenous Every 12 hours 04/17/23 1807     04/17/23 1600  cefTRIAXone (ROCEPHIN) 1 g in sodium chloride 0.9 % 100 mL IVPB        1 g 200 mL/hr over 30 Minutes Intravenous  Once 04/17/23 1550 04/17/23 1738   04/17/23 1600  metroNIDAZOLE (FLAGYL) IVPB 500 mg        500 mg 100 mL/hr over 60 Minutes Intravenous  Once 04/17/23 1550 04/17/23 1747       Assessment/Plan: 75M 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 -cont' abx -will likely need UGI to eval is he does have a fistula.  Would hold off for now or even as outpt has this will likely increase his diarrhea and abd pain   LOS: 0 days    Axel Filler 04/18/2023

## 2023-04-19 DIAGNOSIS — E871 Hypo-osmolality and hyponatremia: Secondary | ICD-10-CM | POA: Diagnosis not present

## 2023-04-19 LAB — CBC WITH DIFFERENTIAL/PLATELET
Abs Immature Granulocytes: 0.25 10*3/uL — ABNORMAL HIGH (ref 0.00–0.07)
Basophils Absolute: 0.1 10*3/uL (ref 0.0–0.1)
Basophils Relative: 1 %
Eosinophils Absolute: 0.1 10*3/uL (ref 0.0–0.5)
Eosinophils Relative: 1 %
HCT: 23.3 % — ABNORMAL LOW (ref 39.0–52.0)
Hemoglobin: 8.3 g/dL — ABNORMAL LOW (ref 13.0–17.0)
Immature Granulocytes: 2 %
Lymphocytes Relative: 16 %
Lymphs Abs: 1.7 10*3/uL (ref 0.7–4.0)
MCH: 29.5 pg (ref 26.0–34.0)
MCHC: 35.6 g/dL (ref 30.0–36.0)
MCV: 82.9 fL (ref 80.0–100.0)
Monocytes Absolute: 1.4 10*3/uL — ABNORMAL HIGH (ref 0.1–1.0)
Monocytes Relative: 13 %
Neutro Abs: 7.3 10*3/uL (ref 1.7–7.7)
Neutrophils Relative %: 67 %
Platelets: 70 10*3/uL — ABNORMAL LOW (ref 150–400)
RBC: 2.81 MIL/uL — ABNORMAL LOW (ref 4.22–5.81)
RDW: 14.2 % (ref 11.5–15.5)
Smear Review: DECREASED
WBC: 10.7 10*3/uL — ABNORMAL HIGH (ref 4.0–10.5)
nRBC: 0 % (ref 0.0–0.2)

## 2023-04-19 LAB — BASIC METABOLIC PANEL
Anion gap: 10 (ref 5–15)
Anion gap: 8 (ref 5–15)
BUN: <5 mg/dL — ABNORMAL LOW (ref 6–20)
BUN: <5 mg/dL — ABNORMAL LOW (ref 6–20)
CO2: 23 mmol/L (ref 22–32)
CO2: 23 mmol/L (ref 22–32)
Calcium: 7.3 mg/dL — ABNORMAL LOW (ref 8.9–10.3)
Calcium: 7.2 mg/dL — ABNORMAL LOW (ref 8.9–10.3)
Chloride: 93 mmol/L — ABNORMAL LOW (ref 98–111)
Chloride: 95 mmol/L — ABNORMAL LOW (ref 98–111)
Creatinine, Ser: 0.5 mg/dL — ABNORMAL LOW (ref 0.61–1.24)
Creatinine, Ser: 0.56 mg/dL — ABNORMAL LOW (ref 0.61–1.24)
GFR, Estimated: >60 mL/min (ref >60)
GFR, Estimated: >60 mL/min (ref >60)
Glucose, Bld: 101 mg/dL — ABNORMAL HIGH (ref 70–99)
Glucose, Bld: 88 mg/dL (ref 70–99)
Potassium: 3.4 mmol/L — ABNORMAL LOW (ref 3.5–5.1)
Potassium: 3.4 mmol/L — ABNORMAL LOW (ref 3.5–5.1)
Sodium: 126 mmol/L — ABNORMAL LOW (ref 135–145)
Sodium: 126 mmol/L — ABNORMAL LOW (ref 135–145)

## 2023-04-19 LAB — CBC
HCT: 24.1 % — ABNORMAL LOW (ref 39.0–52.0)
Hemoglobin: 8.6 g/dL — ABNORMAL LOW (ref 13.0–17.0)
MCH: 29.4 pg (ref 26.0–34.0)
MCHC: 35.7 g/dL (ref 30.0–36.0)
MCV: 82.3 fL (ref 80.0–100.0)
Platelets: 71 10*3/uL — ABNORMAL LOW (ref 150–400)
RBC: 2.93 MIL/uL — ABNORMAL LOW (ref 4.22–5.81)
RDW: 14.1 % (ref 11.5–15.5)
WBC: 11.1 10*3/uL — ABNORMAL HIGH (ref 4.0–10.5)
nRBC: 0 % (ref 0.0–0.2)

## 2023-04-19 LAB — OSMOLALITY, URINE: Osmolality, Ur: 409 mOsm/kg (ref 300–900)

## 2023-04-19 LAB — TECHNOLOGIST SMEAR REVIEW: Plt Morphology: DECREASED

## 2023-04-19 LAB — SODIUM: Sodium: 130 mmol/L — ABNORMAL LOW (ref 135–145)

## 2023-04-19 LAB — SODIUM, URINE, RANDOM: Sodium, Ur: 37 mmol/L

## 2023-04-19 MED ORDER — SODIUM CHLORIDE 0.9 % IV BOLUS
1000.0000 mL | Freq: Once | INTRAVENOUS | Status: AC
  Administered 2023-04-19: 1000 mL via INTRAVENOUS

## 2023-04-19 MED ORDER — POTASSIUM CHLORIDE CRYS ER 20 MEQ PO TBCR
40.0000 meq | EXTENDED_RELEASE_TABLET | Freq: Once | ORAL | Status: AC
  Administered 2023-04-19: 40 meq via ORAL
  Filled 2023-04-19: qty 2

## 2023-04-19 NOTE — Progress Notes (Signed)
Subjective/Chief Complaint: PT with con't LLQ abd pain No diarrhea   Objective: Vital signs in last 24 hours: Temp:  [97.7 F (36.5 C)-97.9 F (36.6 C)] 97.8 F (36.6 C) (07/21 0400) Pulse Rate:  [62-72] 72 (07/21 0400) Resp:  [14-20] 20 (07/21 0400) BP: (93-97)/(69-78) 95/69 (07/21 0400) SpO2:  [98 %-100 %] 98 % (07/21 0400) Last BM Date : 04/17/23  Intake/Output from previous day: 07/20 0701 - 07/21 0700 In: 266.8 [I.V.:66.8; IV Piggyback:200] Out: 730 [Urine:730] Intake/Output this shift: No intake/output data recorded.  PE:   Constitutional: No acute distress, conversant, appears states age. Eyes: Anicteric sclerae, moist conjunctiva, no lid lag Lungs: Clear to auscultation bilaterally, normal respiratory effort CV: regular rate and rhythm, no murmurs, no peripheral edema, pedal pulses 2+ GI: Soft, no masses or hepatosplenomegaly, tender to palpation LLQ, min dist Skin: No rashes, palpation reveals normal turgor Psychiatric: appropriate judgment and insight, oriented to person, place, and time  Lab Results:  Recent Labs    04/18/23 0617 04/19/23 0022  WBC 12.1* 11.1*  HGB 8.9* 8.6*  HCT 24.5* 24.1*  PLT 80* 71*   BMET Recent Labs    04/18/23 1158 04/18/23 1840 04/19/23 0022  NA 126* 127* 126*  K 3.7  --  3.4*  CL 95*  --  93*  CO2 22  --  23  GLUCOSE 122*  --  101*  BUN 6  --  <5*  CREATININE 0.63  --  0.50*  CALCIUM 7.4*  --  7.3*   PT/INR No results for input(s): "LABPROT", "INR" in the last 72 hours. ABG No results for input(s): "PHART", "HCO3" in the last 72 hours.  Invalid input(s): "PCO2", "PO2"  Studies/Results: CT ABDOMEN PELVIS W CONTRAST  Result Date: 04/17/2023 CLINICAL DATA:  Left lower quadrant abdominal pain EXAM: CT ABDOMEN AND PELVIS WITH CONTRAST TECHNIQUE: Multidetector CT imaging of the abdomen and pelvis was performed using the standard protocol following bolus administration of intravenous contrast. RADIATION DOSE  REDUCTION: This exam was performed according to the departmental dose-optimization program which includes automated exposure control, adjustment of the mA and/or kV according to patient size and/or use of iterative reconstruction technique. CONTRAST:  75mL OMNIPAQUE IOHEXOL 350 MG/ML SOLN COMPARISON:  CT abdomen and pelvis dated 11/29/2022 04/15/2022 FINDINGS: Lower chest: No focal consolidation or pulmonary nodule in the lung bases. No pleural effusion or pneumothorax demonstrated. Partially imaged heart size is normal. Hepatobiliary: Severe steatosis. No focal hepatic lesions. No intra or extrahepatic biliary ductal dilation. Cholecystectomy. Pancreas: Status post Whipple. No main pancreatic ductal dilation inflammatory changes. Spleen: Normal in size without focal abnormality. Adrenals/Urinary Tract: No adrenal nodules. No suspicious renal mass, calculi or hydronephrosis. Left urothelial thickening. No focal bladder wall thickening. Stomach/Bowel: Postsurgical changes of the stomach. 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 Mild thickening of the descending colon and rectosigmoid colon, which is mildly underdistended. Normal appendix. Vascular/Lymphatic: Aortic atherosclerosis. No enlarged abdominal or pelvic lymph nodes. Reproductive: Prostate is unremarkable. Other: No free fluid, fluid collection, or free air. Musculoskeletal: No acute or abnormal lytic or blastic osseous lesions. Multilevel degenerative changes of the partially imaged thoracic and lumbar spine. IMPRESSION: 1. 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 2. Mild thickening  of the descending colon and rectosigmoid  colon, which may be due to underdistention or mild colitis. 3. Left urothelial thickening, which may be due to ascending urinary tract infection. Recommend correlation with urinalysis. 4. Severe hepatic steatosis. 5.  Aortic Atherosclerosis (ICD10-I70.0). Electronically Signed   By: Agustin Cree M.D.   On: 04/17/2023 15:08    Anti-infectives: Anti-infectives (From admission, onward)    Start     Dose/Rate Route Frequency Ordered Stop   04/18/23 1600  cefTRIAXone (ROCEPHIN) 2 g in sodium chloride 0.9 % 100 mL IVPB        2 g 200 mL/hr over 30 Minutes Intravenous Every 24 hours 04/17/23 1807     04/18/23 0500  metroNIDAZOLE (FLAGYL) IVPB 500 mg        500 mg 100 mL/hr over 60 Minutes Intravenous Every 12 hours 04/17/23 1807     04/17/23 1600  cefTRIAXone (ROCEPHIN) 1 g in sodium chloride 0.9 % 100 mL IVPB        1 g 200 mL/hr over 30 Minutes Intravenous  Once 04/17/23 1550 04/17/23 1738   04/17/23 1600  metroNIDAZOLE (FLAGYL) IVPB 500 mg        500 mg 100 mL/hr over 60 Minutes Intravenous  Once 04/17/23 1550 04/17/23 1747       Assessment/Plan: 7M s/p Whipple 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 -cont' abx -will likely need UGI to eval is he does have a fistula.  Would hold off for now or even as outpt has this will likely increase his diarrhea and abd pain  LOS: 1 day    Axel Filler 04/19/2023

## 2023-04-19 NOTE — Progress Notes (Signed)
PROGRESS NOTE    Brent Rogers  ZHY:[id number] DOB: 1966/02/14 DOA: 04/17/2023 PCP: Marcine Matar, MD   Brief Narrative: Brent Rogers is a 57 y.o. male with a history of hypertension, GERD, PUD, CAD, depression, anxiety, chronic pain, anemia, partial pancreatectomy, cholecystectomy.  Patient presented secondary to nausea, vomiting and abdominal pain. ED workup significant for evidence of colitis and UTI. General surgery consulted. Empiric antibiotics started. Patient made NPO and IV fluids started.   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 Antibiotics started. General surgery consulted and are following. Patient is NPO. -Continue Ceftriaxone and Flagyl -General surgery recommendations: NPO today, possible clear liquid diet tomorrow -Outpatient GI follow-up for evaluation of possible GJ-colonic fistula  UTI Patient with symptoms and CT evidence of urothelial thickening concerning for possible ascending urinary tract infection. Urinalysis suggests likely infection. Antibiotics initiated for colitis. Urine culture significant for Klebsiella pneumoniae -Continue antibiotics -Follow urine culture data  Hyponatremia Possibly related to hypovolemia from vomiting. Sodium of 124 on admission with low of 123. Stable sodium of 126 today. -Trend sodium -Continue normal saline IV fluids -Urine sodium/osmolality still pending  Hypokalemia -potassium supplementation  Hypocalcemia Noted on BMP, but corrected calcium is 9.2.  History of pancreatic mass s/p whipple procedure Chronic pancreatitis Noted. Patient is prescribed Creon for which he is not using.  Chronic pain Noted.  GERD PUD Patient is prescribed Protonix for which he is not taking.  Acute anemia Unclear etiology CT abdomen/pelvis without etiology for anemia.    Thrombocytopenia Acute. Platelets of 89,000 on admission, down slightly. -Daily CBC  Depression Anxiety Noted. Patient is not on medication management as an outpatient.  CAD Noted.  Primary hypertension Not on medication therapy. Blood pressure normotensive at this time.  Underweight Estimated body mass index is 14.66 kg/m as calculated from the following:   Height as of this encounter: 6\' 3"  (1.905 m).   Weight as of this encounter: 53.2 kg.   DVT prophylaxis: SCDs Code Status:   Code Status: Full Code Family Communication: None at bedside Disposition Plan: Discharge pending ability to advance diet and improvement of symptoms   Consultants:  General surgery  Procedures:  None  Antimicrobials: Ceftriaxone Flagyl    Subjective: Abdominal pain is improving. No nausea/vomiting.  Objective: BP 101/77   Pulse 72   Temp 97.7 F (36.5 C) (Oral)   Resp 15   Ht 6\' 3"  (1.905 m)   Wt 53.2 kg   SpO2 98%   BMI 14.66 kg/m   Examination:  General exam: Appears calm and comfortable Respiratory system: Clear to auscultation. Respiratory effort normal. Cardiovascular system: S1 & S2 heard, RRR. Gastrointestinal system: Abdomen is nondistended, soft and nontender. Normal bowel sounds heard. Central nervous system: Alert and oriented. No focal neurological deficits. Musculoskeletal: No edema. No calf tenderness Psychiatry: Judgement and insight appear normal. Mood & affect appropriate.    Data Reviewed: I have personally reviewed following labs and imaging studies  CBC Lab Results  Component Value Date   WBC 11.1 (H) 04/19/2023   WBC 10.7 (H) 04/19/2023   RBC 2.93 (L) 04/19/2023   RBC 2.81 (L) 04/19/2023   HGB 8.6 (L) 04/19/2023   HGB 8.3 (L) 04/19/2023   HCT 24.1 (L) 04/19/2023   HCT 23.3 (L) 04/19/2023   MCV 82.3 04/19/2023   MCV 82.9 04/19/2023   MCH 29.4 04/19/2023   MCH  29.5 04/19/2023   PLT 71 (L) 04/19/2023   PLT 70 (L) 04/19/2023   MCHC 35.7  04/19/2023   MCHC 35.6 04/19/2023   RDW 14.1 04/19/2023   RDW 14.2 04/19/2023   LYMPHSABS 1.7 04/19/2023   MONOABS 1.4 (H) 04/19/2023   EOSABS 0.1 04/19/2023   BASOSABS 0.1 04/19/2023     Last metabolic panel Lab Results  Component Value Date   NA 126 (L) 04/19/2023   K 3.4 (L) 04/19/2023   CL 95 (L) 04/19/2023   CO2 23 04/19/2023   BUN <5 (L) 04/19/2023   CREATININE 0.56 (L) 04/19/2023   GLUCOSE 88 04/19/2023   GFRNONAA >60 04/19/2023   GFRAA 128 06/22/2020   CALCIUM 7.2 (L) 04/19/2023   PHOS 3.1 03/07/2021   PROT 4.6 (L) 04/18/2023   ALBUMIN 1.7 (L) 04/18/2023   LABGLOB 2.0 06/07/2019   AGRATIO 2.0 06/07/2019   BILITOT 1.4 (H) 04/18/2023   ALKPHOS 172 (H) 04/18/2023   AST 39 04/18/2023   ALT 32 04/18/2023   ANIONGAP 8 04/19/2023    GFR: Estimated Creatinine Clearance: 76.7 mL/min (A) (by C-G formula based on SCr of 0.56 mg/dL (L)).  No results found for this or any previous visit (from the past 240 hour(s)).    Radiology Studies: CT ABDOMEN PELVIS W CONTRAST  Result Date: 04/17/2023 CLINICAL DATA:  Left lower quadrant abdominal pain EXAM: CT ABDOMEN AND PELVIS WITH CONTRAST TECHNIQUE: Multidetector CT imaging of the abdomen and pelvis was performed using the standard protocol following bolus administration of intravenous contrast. RADIATION DOSE REDUCTION: This exam was performed according to the departmental dose-optimization program which includes automated exposure control, adjustment of the mA and/or kV according to patient size and/or use of iterative reconstruction technique. CONTRAST:  75mL OMNIPAQUE IOHEXOL 350 MG/ML SOLN COMPARISON:  CT abdomen and pelvis dated 11/29/2022 04/15/2022 FINDINGS: Lower chest: No focal consolidation or pulmonary nodule in the lung bases. No pleural effusion or pneumothorax demonstrated. Partially imaged heart size is normal. Hepatobiliary: Severe steatosis. No focal hepatic lesions. No intra or extrahepatic biliary ductal dilation.  Cholecystectomy. Pancreas: Status post Whipple. No main pancreatic ductal dilation inflammatory changes. Spleen: Normal in size without focal abnormality. Adrenals/Urinary Tract: No adrenal nodules. No suspicious renal mass, calculi or hydronephrosis. Left urothelial thickening. No focal bladder wall thickening. Stomach/Bowel: Postsurgical changes of the stomach. 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 Mild thickening of the descending colon and rectosigmoid colon, which is mildly underdistended. Normal appendix. Vascular/Lymphatic: Aortic atherosclerosis. No enlarged abdominal or pelvic lymph nodes. Reproductive: Prostate is unremarkable. Other: No free fluid, fluid collection, or free air. Musculoskeletal: No acute or abnormal lytic or blastic osseous lesions. Multilevel degenerative changes of the partially imaged thoracic and lumbar spine. IMPRESSION: 1. 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 2. Mild thickening of the descending colon and rectosigmoid colon, which may be due to underdistention or mild colitis. 3. Left urothelial thickening, which may be due to ascending urinary tract infection. Recommend correlation with urinalysis. 4. Severe hepatic steatosis. 5.  Aortic Atherosclerosis (ICD10-I70.0). Electronically Signed   By: Agustin Cree M.D.   On: 04/17/2023 15:08      LOS: 1 day    Jacquelin Hawking, MD [facility] 04/19/2023, 10:54  AM   If 7PM-7AM, please contact night-coverage [email or web address]

## 2023-04-20 DIAGNOSIS — E871 Hypo-osmolality and hyponatremia: Secondary | ICD-10-CM | POA: Diagnosis not present

## 2023-04-20 LAB — CBC
HCT: 26.9 % — ABNORMAL LOW (ref 39.0–52.0)
Hemoglobin: 9.1 g/dL — ABNORMAL LOW (ref 13.0–17.0)
MCH: 29.4 pg (ref 26.0–34.0)
MCHC: 33.8 g/dL (ref 30.0–36.0)
MCV: 86.8 fL (ref 80.0–100.0)
Platelets: 71 10*3/uL — ABNORMAL LOW (ref 150–400)
RBC: 3.1 MIL/uL — ABNORMAL LOW (ref 4.22–5.81)
RDW: 15.2 % (ref 11.5–15.5)
WBC: 10.6 10*3/uL — ABNORMAL HIGH (ref 4.0–10.5)
nRBC: 0 % (ref 0.0–0.2)

## 2023-04-20 LAB — BASIC METABOLIC PANEL
Anion gap: 9 (ref 5–15)
BUN: <5 mg/dL — ABNORMAL LOW (ref 6–20)
CO2: 21 mmol/L — ABNORMAL LOW (ref 22–32)
Calcium: 7.5 mg/dL — ABNORMAL LOW (ref 8.9–10.3)
Chloride: 99 mmol/L (ref 98–111)
Creatinine, Ser: 0.62 mg/dL (ref 0.61–1.24)
GFR, Estimated: >60 mL/min (ref >60)
Glucose, Bld: 56 mg/dL — ABNORMAL LOW (ref 70–99)
Potassium: 4 mmol/L (ref 3.5–5.1)
Sodium: 129 mmol/L — ABNORMAL LOW (ref 135–145)

## 2023-04-20 LAB — URINE CULTURE: Culture: >=100000 — AB

## 2023-04-20 MED ORDER — SODIUM CHLORIDE 0.9 % IV SOLN
2.0000 g | INTRAVENOUS | Status: DC
  Administered 2023-04-20 – 2023-04-23 (×4): 2 g via INTRAVENOUS
  Filled 2023-04-20 (×4): qty 20

## 2023-04-20 MED ORDER — HYDROCERIN EX CREA
TOPICAL_CREAM | Freq: Two times a day (BID) | CUTANEOUS | Status: DC
  Administered 2023-04-20 – 2023-05-16 (×18): 1 via TOPICAL
  Filled 2023-04-20 (×3): qty 113

## 2023-04-20 MED ORDER — HYDROMORPHONE HCL 1 MG/ML IJ SOLN
0.5000 mg | INTRAMUSCULAR | Status: DC | PRN
  Administered 2023-04-23 – 2023-04-24 (×4): 0.5 mg via INTRAVENOUS
  Filled 2023-04-20 (×4): qty 0.5

## 2023-04-20 MED ORDER — HYDROCODONE-ACETAMINOPHEN 5-325 MG PO TABS
1.0000 | ORAL_TABLET | ORAL | Status: DC | PRN
  Administered 2023-04-20 – 2023-04-21 (×4): 1 via ORAL
  Administered 2023-04-22: 2 via ORAL
  Administered 2023-04-22: 1 via ORAL
  Administered 2023-04-23 – 2023-04-24 (×2): 2 via ORAL
  Filled 2023-04-20: qty 1
  Filled 2023-04-20 (×2): qty 2
  Filled 2023-04-20: qty 1
  Filled 2023-04-20: qty 2
  Filled 2023-04-20 (×3): qty 1

## 2023-04-20 MED ORDER — SODIUM CHLORIDE 0.9 % IV BOLUS
1000.0000 mL | Freq: Once | INTRAVENOUS | Status: AC
  Administered 2023-04-20: 1000 mL via INTRAVENOUS

## 2023-04-20 NOTE — Progress Notes (Signed)
PROGRESS NOTE    Brent Rogers  WUJ:[id number] DOB: March 23, 1966 DOA: 04/17/2023 PCP: Marcine Matar, MD   Brief Narrative: Brent Rogers is a 57 y.o. male with a history of hypertension, GERD, PUD, CAD, depression, anxiety, chronic pain, anemia, partial pancreatectomy, cholecystectomy.  Patient presented secondary to nausea, vomiting and abdominal pain. ED workup significant for evidence of colitis and UTI. General surgery consulted. Empiric antibiotics started. Patient made NPO and IV fluids started.   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 Antibiotics started. General surgery consulted and are following. Patient is NPO. 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 Urinalysis suggests likely infection. Antibiotics initiated for colitis. Urine culture significant for Klebsiella pneumoniae and is sensitive to Ceftriaxone -Continue Ceftriaxone  Hyponatremia Possibly related to hypovolemia from vomiting. Sodium of 124 on admission with low of 123. Stable sodium of 129 today. -Trend sodium -Continue normal saline IV fluids -NS bolus  Hypoglycemia Secondary to NPO status and being underweight. Diet now advanced so will avoid adding D5 containing IV fluids at this time. -Check CBG  Hypokalemia Resolved with potassium supplementation  Hypocalcemia Noted on BMP, but corrected calcium is 9.2.  History of pancreatic mass s/p whipple procedure Chronic pancreatitis Noted. Patient is prescribed Creon for which he is not using.  Chronic pain Noted.  GERD PUD Patient is  prescribed Protonix for which he is not taking.  Acute anemia Unclear etiology CT abdomen/pelvis without etiology for anemia.   Thrombocytopenia Acute. Platelets of 89,000 on admission, down slightly but is stabilized. -Daily CBC  Depression Anxiety Noted. Patient is not on medication management as an outpatient.  CAD Noted.  Primary hypertension Not on medication therapy. Blood pressure normotensive at this time.  Underweight Estimated body mass index is 14.66 kg/m as calculated from the following:   Height as of this encounter: 6\' 3"  (1.905 m).   Weight as of this encounter: 53.2 kg.   DVT prophylaxis: SCDs Code Status:   Code Status: Full Code Family Communication: None at bedside Disposition Plan: Discharge pending ability to advance diet and improvement of symptoms   Consultants:  General surgery  Procedures:  None  Antimicrobials: Ceftriaxone Flagyl    Subjective: Back pain, but no abdominal pain, nausea or vomiting. He states he has not had a bowel movement of passed flatus  Objective: BP 90/68 (BP Location: Left Arm)   Pulse 66   Temp 98.4 F (36.9 C) (Oral)   Resp 13   Ht 6\' 3"  (1.905 m)   Wt 53.2 kg   SpO2 100%   BMI 14.66 kg/m   Examination:  General exam: Appears calm and comfortable. Thin appearing. Respiratory system: Clear to auscultation. Respiratory effort normal. Cardiovascular system: S1 & S2 heard, RRR. Gastrointestinal system: Abdomen is nondistended, soft and nontender. Normal bowel sounds heard. Central nervous system: Alert and oriented. No focal neurological deficits. Musculoskeletal: No edema. No calf tenderness Skin: No cyanosis. Dry skin. Psychiatry: Judgement and insight appear normal. Mood & affect appropriate.    Data Reviewed: I have personally reviewed following labs and imaging studies  CBC Lab Results  Component Value Date   WBC 10.6 (H) 04/20/2023   RBC 3.10 (L) 04/20/2023  HGB 9.1 (L) 04/20/2023   HCT  26.9 (L) 04/20/2023   MCV 86.8 04/20/2023   MCH 29.4 04/20/2023   PLT 71 (L) 04/20/2023   MCHC 33.8 04/20/2023   RDW 15.2 04/20/2023   LYMPHSABS 1.7 04/19/2023   MONOABS 1.4 (H) 04/19/2023   EOSABS 0.1 04/19/2023   BASOSABS 0.1 04/19/2023     Last metabolic panel Lab Results  Component Value Date   NA 129 (L) 04/20/2023   K 4.0 04/20/2023   CL 99 04/20/2023   CO2 21 (L) 04/20/2023   BUN <5 (L) 04/20/2023   CREATININE 0.62 04/20/2023   GLUCOSE 56 (L) 04/20/2023   GFRNONAA >60 04/20/2023   GFRAA 128 06/22/2020   CALCIUM 7.5 (L) 04/20/2023   PHOS 3.1 03/07/2021   PROT 4.6 (L) 04/18/2023   ALBUMIN 1.7 (L) 04/18/2023   LABGLOB 2.0 06/07/2019   AGRATIO 2.0 06/07/2019   BILITOT 1.4 (H) 04/18/2023   ALKPHOS 172 (H) 04/18/2023   AST 39 04/18/2023   ALT 32 04/18/2023   ANIONGAP 9 04/20/2023    GFR: Estimated Creatinine Clearance: 76.7 mL/min (by C-G formula based on SCr of 0.62 mg/dL).  Recent Results (from the past 240 hour(s))  Urine Culture (for pregnant, neutropenic or urologic patients or patients with an indwelling urinary catheter)     Status: Abnormal   Collection Time: 04/18/23 12:07 PM   Specimen: Urine, Clean Catch  Result Value Ref Range Status   Specimen Description URINE, CLEAN CATCH  Final   Special Requests   Final    NONE Performed at [facility], [address] [place], [place] [address]    Culture >=100,000 COLONIES/mL KLEBSIELLA PNEUMONIAE (A)  Final   Report Status 04/20/2023 FINAL  Final   Organism ID, Bacteria KLEBSIELLA PNEUMONIAE (A)  Final      Susceptibility   Klebsiella pneumoniae - MIC*    AMPICILLIN >=32 RESISTANT Resistant     CEFAZOLIN <=4 SENSITIVE Sensitive     CEFEPIME <=0.12 SENSITIVE Sensitive     CEFTRIAXONE <=0.25 SENSITIVE Sensitive     CIPROFLOXACIN <=0.25 SENSITIVE Sensitive     GENTAMICIN <=1 SENSITIVE Sensitive     IMIPENEM <=0.25 SENSITIVE Sensitive     NITROFURANTOIN 64 INTERMEDIATE Intermediate      TRIMETH/SULFA <=20 SENSITIVE Sensitive     AMPICILLIN/SULBACTAM >=32 RESISTANT Resistant     PIP/TAZO 8 SENSITIVE Sensitive     * >=100,000 COLONIES/mL KLEBSIELLA PNEUMONIAE      Radiology Studies: No results found.    LOS: 2 days    Jacquelin Hawking, MD [facility] 04/20/2023, 11:32 AM   If 7PM-7AM, please contact night-coverage [email or web address]

## 2023-04-20 NOTE — Progress Notes (Signed)
Subjective: CC: LLQ improved. Still requiring IV pain medication but reports this is mainly for back pain.  No further n/v. Wants something to eat or drink. No flatus. No bm/diarrhea since admission. No recent abx use. Has not been on Creon for at least 1.5 years.  Looks like he has several visits to the ED in the past for left sided abdominal pain, n/v/d. Has been losing weight over the last several months. Looks like he was 60kg in June 2023, 68kg in March 2024 and is currently 53.2kg. Last colonoscopy was in 2022 that showed scattered small and large-mouthed diverticula were found in the entire colon but was otherwise normal.   Afebrile. WBC downtrending at 10.6.   Objective: Vital signs in last 24 hours: Temp:  [97.4 F (36.3 C)-98.5 F (36.9 C)] 98.4 F (36.9 C) (07/22 0813) Pulse Rate:  [60-70] 66 (07/22 0813) Resp:  [10-20] 13 (07/22 0813) BP: (89-107)/(62-80) 90/68 (07/22 0813) SpO2:  [90 %-100 %] 100 % (07/22 0813) Last BM Date : 04/17/23  Intake/Output from previous day: 07/21 0701 - 07/22 0700 In: 950 [P.O.:150; I.V.:800] Out: 325 [Urine:325] Intake/Output this shift: Total I/O In: 103.8 [I.V.:103.8] Out: -   PE: Gen:  Alert, NAD, pleasant Pulm: Rate and effort normal Abd: Soft, ND, mild llq ttp - no rigidity or guarding and is otherwise NT, +BS  Lab Results:  Recent Labs    04/19/23 0022 04/20/23 0228  WBC 10.7*  11.1* 10.6*  HGB 8.3*  8.6* 9.1*  HCT 23.3*  24.1* 26.9*  PLT 70*  71* 71*   BMET Recent Labs    04/19/23 0849 04/19/23 1854 04/20/23 0228  NA 126* 130* 129*  K 3.4*  --  4.0  CL 95*  --  99  CO2 23  --  21*  GLUCOSE 88  --  56*  BUN <5*  --  <5*  CREATININE 0.56*  --  0.62  CALCIUM 7.2*  --  7.5*   PT/INR No results for input(s): "LABPROT", "INR" in the last 72 hours. CMP     Component Value Date/Time   NA 129 (L) 04/20/2023 0228   NA 140 06/22/2020 1648   K 4.0 04/20/2023 0228   CL 99 04/20/2023 0228   CO2 21 (L)  04/20/2023 0228   GLUCOSE 56 (L) 04/20/2023 0228   BUN <5 (L) 04/20/2023 0228   BUN 4 (L) 06/22/2020 1648   CREATININE 0.62 04/20/2023 0228   CREATININE 0.91 10/15/2015 1517   CALCIUM 7.5 (L) 04/20/2023 0228   PROT 4.6 (L) 04/18/2023 0617   PROT 6.2 06/22/2020 1648   ALBUMIN 1.7 (L) 04/18/2023 0617   ALBUMIN 3.5 (L) 06/22/2020 1648   AST 39 04/18/2023 0617   ALT 32 04/18/2023 0617   ALKPHOS 172 (H) 04/18/2023 0617   BILITOT 1.4 (H) 04/18/2023 0617   BILITOT 0.2 06/22/2020 1648   GFRNONAA >60 04/20/2023 0228   GFRNONAA >89 10/15/2015 1517   GFRAA 128 06/22/2020 1648   GFRAA >89 10/15/2015 1517   Lipase     Component Value Date/Time   LIPASE 17 04/17/2023 1110    Studies/Results: No results found.  Anti-infectives: Anti-infectives (From admission, onward)    Start     Dose/Rate Route Frequency Ordered Stop   04/18/23 1600  cefTRIAXone (ROCEPHIN) 2 g in sodium chloride 0.9 % 100 mL IVPB        2 g 200 mL/hr over 30 Minutes Intravenous Every 24 hours 04/17/23 1807  04/18/23 0500  metroNIDAZOLE (FLAGYL) IVPB 500 mg        500 mg 100 mL/hr over 60 Minutes Intravenous Every 12 hours 04/17/23 1807     04/17/23 1600  cefTRIAXone (ROCEPHIN) 1 g in sodium chloride 0.9 % 100 mL IVPB        1 g 200 mL/hr over 30 Minutes Intravenous  Once 04/17/23 1550 04/17/23 1738   04/17/23 1600  metroNIDAZOLE (FLAGYL) IVPB 500 mg        500 mg 100 mL/hr over 60 Minutes Intravenous  Once 04/17/23 1550 04/17/23 1747        Assessment/Plan 77M s/p Whipple 2015 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 Afebrile. WBC dowtrending. No peritonitis on exam. No indication for emergency surgery. Will start on liquids. If tolerates without worsening of his symptoms, can likely advance as tolerated. If any further diarrhea consider GI panel etc.  - Will discuss with MD if patient should have UGI here to help delineate if he truly has GJ colonic fistula. He had a colonoscopy in  2022 that did not show evidence of this. If we plan to do this as an outpatient, anticipate we will sign off once we ensure he is tolerating PO. - Patient with several visits in the past with similar left sided abdominal pain, n/v/d. Agree with restarting Creon and f/u with Dr. Donell Beers as an outpatient.  - Abx per primary for Kleb pna UTI  FEN - CLD, IVF per [facility] VTE - SCDs, chem ppx on hold for thrombocytopenia  ID - Rocephin/Flagyl  - Per [facility] -  UTI Hyponatremia  HTN CAD  I reviewed nursing notes, hospitalist notes, last 24 h vitals and pain scores, last 48 h intake and output, last 24 h labs and trends, and last 24 h imaging results.   LOS: 2 days    Jacinto Halim , [facility] 04/20/2023, 9:33 AM Please see Amion for pager number during day hours 7:00am-4:30pm

## 2023-04-21 ENCOUNTER — Inpatient Hospital Stay (HOSPITAL_COMMUNITY): Payer: BLUE CROSS/BLUE SHIELD

## 2023-04-21 ENCOUNTER — Other Ambulatory Visit: Payer: Self-pay

## 2023-04-21 DIAGNOSIS — E871 Hypo-osmolality and hyponatremia: Secondary | ICD-10-CM | POA: Diagnosis not present

## 2023-04-21 LAB — BASIC METABOLIC PANEL
Anion gap: 6 (ref 5–15)
BUN: <5 mg/dL — ABNORMAL LOW (ref 6–20)
CO2: 20 mmol/L — ABNORMAL LOW (ref 22–32)
Calcium: 6.9 mg/dL — ABNORMAL LOW (ref 8.9–10.3)
Chloride: 102 mmol/L (ref 98–111)
Creatinine, Ser: 0.5 mg/dL — ABNORMAL LOW (ref 0.61–1.24)
GFR, Estimated: >60 mL/min (ref >60)
Glucose, Bld: 151 mg/dL — ABNORMAL HIGH (ref 70–99)
Potassium: 3.8 mmol/L (ref 3.5–5.1)
Sodium: 128 mmol/L — ABNORMAL LOW (ref 135–145)

## 2023-04-21 LAB — CBC
HCT: 23.9 % — ABNORMAL LOW (ref 39.0–52.0)
Hemoglobin: 8.5 g/dL — ABNORMAL LOW (ref 13.0–17.0)
MCH: 30.1 pg (ref 26.0–34.0)
MCHC: 35.6 g/dL (ref 30.0–36.0)
MCV: 84.8 fL (ref 80.0–100.0)
Platelets: 100 10*3/uL — ABNORMAL LOW (ref 150–400)
RBC: 2.82 MIL/uL — ABNORMAL LOW (ref 4.22–5.81)
RDW: 14.4 % (ref 11.5–15.5)
WBC: 9.5 10*3/uL (ref 4.0–10.5)
nRBC: 0 % (ref 0.0–0.2)

## 2023-04-21 MED ORDER — ALUM & MAG HYDROXIDE-SIMETH 200-200-20 MG/5ML PO SUSP
30.0000 mL | ORAL | Status: DC | PRN
  Administered 2023-04-21 – 2023-04-22 (×2): 30 mL via ORAL
  Filled 2023-04-21 (×2): qty 30

## 2023-04-21 MED ORDER — SODIUM CHLORIDE 0.9% FLUSH: 10.0000 mL | INTRAVENOUS | Status: DC | PRN

## 2023-04-21 MED ORDER — IOHEXOL 300 MG/ML  SOLN
100.0000 mL | Freq: Once | INTRAMUSCULAR | Status: AC | PRN
  Administered 2023-04-21: 80 mL via ORAL

## 2023-04-21 MED ORDER — CHLORHEXIDINE GLUCONATE CLOTH 2 % EX PADS
6.0000 | MEDICATED_PAD | Freq: Every day | CUTANEOUS | Status: DC
  Administered 2023-04-21 – 2023-04-30 (×11): 6 via TOPICAL

## 2023-04-21 MED ORDER — BOOST / RESOURCE BREEZE PO LIQD CUSTOM
1.0000 | Freq: Three times a day (TID) | ORAL | Status: DC
  Administered 2023-04-21: 1 via ORAL

## 2023-04-21 MED ORDER — SODIUM CHLORIDE 0.9% FLUSH
10.0000 mL | Freq: Two times a day (BID) | INTRAVENOUS | Status: DC
  Administered 2023-04-21: 20 mL
  Administered 2023-04-22 – 2023-04-30 (×16): 10 mL
  Administered 2023-04-30: 20 mL
  Administered 2023-05-01: 10 mL

## 2023-04-21 NOTE — Progress Notes (Signed)
   Subjective/Chief Complaint: Back pain and fullness/ab pain after eating, did not eat any dinner, no n/v not having much bowel function   Objective: Vital signs in last 24 hours: Temp:  [98 F (36.7 C)-98.9 F (37.2 C)] 98 F (36.7 C) (07/23 0759) Pulse Rate:  [60-81] 81 (07/23 0759) Resp:  [13-18] 17 (07/23 0323) BP: (90-106)/(70-80) 97/77 (07/23 0323) SpO2:  [100 %] 100 % (07/23 0323) Last BM Date : 04/19/23  Intake/Output from previous day: 07/22 0701 - 07/23 0700 In: 2536.1 [P.O.:480; I.V.:1656.1; IV Piggyback:400] Out: 500 [Urine:500] Intake/Output this shift: No intake/output data recorded.  Ab soft nontender   Lab Results:  Recent Labs    04/20/23 0228 04/21/23 0113  WBC 10.6* 9.5  HGB 9.1* 8.5*  HCT 26.9* 23.9*  PLT 71* 100*   BMET Recent Labs    04/20/23 0228 04/21/23 0113  NA 129* 128*  K 4.0 3.8  CL 99 102  CO2 21* 20*  GLUCOSE 56* 151*  BUN <5* <5*  CREATININE 0.62 0.50*  CALCIUM 7.5* 6.9*   PT/INR No results for input(s): "LABPROT", "INR" in the last 72 hours. ABG No results for input(s): "PHART", "HCO3" in the last 72 hours.  Invalid input(s): "PCO2", "PO2"  Studies/Results: No results found.  Anti-infectives: Anti-infectives (From admission, onward)    Start     Dose/Rate Route Frequency Ordered Stop   04/20/23 1200  cefTRIAXone (ROCEPHIN) 2 g in sodium chloride 0.9 % 100 mL IVPB        2 g 200 mL/hr over 30 Minutes Intravenous Every 24 hours 04/20/23 1133     04/18/23 1600  cefTRIAXone (ROCEPHIN) 2 g in sodium chloride 0.9 % 100 mL IVPB  Status:  Discontinued        2 g 200 mL/hr over 30 Minutes Intravenous Every 24 hours 04/17/23 1807 04/20/23 1133   04/18/23 0500  metroNIDAZOLE (FLAGYL) IVPB 500 mg        500 mg 100 mL/hr over 60 Minutes Intravenous Every 12 hours 04/17/23 1807     04/17/23 1600  cefTRIAXone (ROCEPHIN) 1 g in sodium chloride 0.9 % 100 mL IVPB        1 g 200 mL/hr over 30 Minutes Intravenous  Once  04/17/23 1550 04/17/23 1738   04/17/23 1600  metroNIDAZOLE (FLAGYL) IVPB 500 mg        500 mg 100 mL/hr over 60 Minutes Intravenous  Once 04/17/23 1550 04/17/23 1747       Assessment/Plan: 89M s/p Whipple 2015 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 Afebrile. WBC dowtrending. No peritonitis on exam. No indication for emergency surgery. - check ugi today for fistula - Abx per primary for Kleb pna UTI   FEN - CLD, IVF per [facility] VTE - SCDs ID - Rocephin/Flagyl   - Per [facility] -  UTI Hyponatremia  HTN CAD  I reviewed hospitalist notes, last 24 h vitals and pain scores, last 48 h intake and output, last 24 h labs and trends, and last 24 h imaging results.  This care required moderate level of medical decision making.   Emelia Loron 04/21/2023

## 2023-04-21 NOTE — Progress Notes (Signed)
Peripherally Inserted Central Catheter Placement  The IV Nurse has discussed with the patient and/or persons authorized to consent for the patient, the purpose of this procedure and the potential benefits and risks involved with this procedure.  The benefits include less needle sticks, lab draws from the catheter, and the patient may be discharged home with the catheter. Risks include, but not limited to, infection, bleeding, blood clot (thrombus formation), and puncture of an artery; nerve damage and irregular heartbeat and possibility to perform a PICC exchange if needed/ordered by physician.  Alternatives to this procedure were also discussed.  Bard Power PICC patient education guide, fact sheet on infection prevention and patient information card has been provided to patient /or left at bedside.    PICC Placement Documentation  PICC Double Lumen 04/21/23 Right Brachial 40 cm 0 cm (Active)  Site Assessment Clean, Dry, Intact 04/21/23 2009  Lumen #1 Status Flushed;Saline locked;Blood return noted 04/21/23 2009  Lumen #2 Status Flushed;Saline locked;Blood return noted 04/21/23 2009  Dressing Type Transparent;Securing device 04/21/23 2009  Dressing Status Antimicrobial disc in place 04/21/23 2009  Safety Lock Not Applicable 04/21/23 2009  Dressing Change Due 04/28/23 04/21/23 2009       Romie Jumper 04/21/2023, 8:11 PM

## 2023-04-21 NOTE — Progress Notes (Signed)
Went by and reviewed UGI results with the patient. He has a gastrocolic fistula. Dr. Donell Beers to review the patients scans.  Recommend clears, PICC/TNA He will eventually need surgery for this but the timing of the operation is to be determined. Will re-check CMP and prealbumin in the morning.  Hosie Spangle, PA-C [facility] Please see Amion for pager number during day hours 7:00am-4:30pm

## 2023-04-21 NOTE — Progress Notes (Signed)
Mobility Specialist Progress Note:   04/21/23 1453  Mobility  Activity Refused mobility   Pt refused mobility d/t weakness and back soreness. Will f/u as able.    Leory Plowman  Mobility Specialist Please contact via [employer] office at [phone]

## 2023-04-21 NOTE — Progress Notes (Signed)
PHARMACY - TOTAL PARENTERAL NUTRITION CONSULT NOTE  Indication: Gastrocolic fistula   Patient Measurements: Height: 6\' 3"  (190.5 cm) Weight: 53.2 kg (117 lb 4.6 oz) IBW/kg (Calculated) : 84.5 TPN AdjBW (KG): 53.2 Body mass index is 14.66 kg/m.  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  Patient presented on 7/19 with N/V x 4 days along with LLQ pain.  Per documentation, CT showed thickening of the distal transverse colon, where there is a complex fistulous connection between the transverse colon to the gastrojejunal anastomosis and adjacent small bowel loops.  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  Patient was started on CLD on 7/22 and reported fullness/abdominal pain after eating.  Pharmacy consulted to manage TPN.  Glucose / Insulin: no hx DM Electrolytes: low Na/CO2, others WNL Renal: SCr < 1, BUN < 5 Hepatic: LFTs WNL, tbili 1.4 Intake / Output; MIVF: NS 100 ml/hr GI Imaging: none since TPN initiation GI Surgeries / Procedures: none since TPN initiation  Central access: PICC ordered on 04/21/23 TPN start date: 04/22/23  Nutritional Goals:  RD Assessment pending    Current Nutrition:  Clear liquids Resource Breeze TID  Plan:  Start TPN on 7/24 TPN labs and nursing care orders  Brenn Gatton D. Laney Potash, PharmD, BCPS, BCCCP 04/21/2023, 3:01 PM

## 2023-04-21 NOTE — Progress Notes (Signed)
PROGRESS NOTE    Brent Rogers  ZOX:[id number] DOB: 1966/05/09 DOA: 04/17/2023 PCP: Marcine Matar, MD   Brief Narrative: Brent Rogers is a 57 y.o. male with a history of hypertension, GERD, PUD, CAD, depression, anxiety, chronic pain, anemia, partial pancreatectomy, cholecystectomy.  Patient presented secondary to nausea, vomiting and abdominal pain. ED workup significant for evidence of colitis and UTI. General surgery consulted. Empiric antibiotics started. Patient made NPO and IV fluids started.  Diet advanced.   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 Antibiotics started. General surgery consulted and are following.  Patient initially n.p.o. and diet has been advanced slowly per general surgery.  Patient with nausea/vomiting overnight. 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 Urinalysis suggests likely infection. Antibiotics initiated for colitis. Urine culture significant for Klebsiella pneumoniae and is sensitive to Ceftriaxone -Continue Ceftriaxone  Hyponatremia Possibly related to hypovolemia from vomiting. Sodium of 124 on admission with low of 123. Stable sodium of 129 today. -Trend sodium -Continue normal saline IV fluids  Hypoglycemia Secondary to NPO status and being underweight. Diet now advanced so will avoid adding D5 containing IV fluids at this time.  Hypokalemia Resolved with potassium supplementation  Hypocalcemia Noted on BMP, but corrected calcium is 9.2.  History of pancreatic mass s/p whipple procedure Chronic pancreatitis Noted. Patient is  prescribed Creon for which he is not using.  Chronic pain Noted.  GERD PUD Patient is prescribed Protonix for which he is not taking.  Acute anemia Unclear etiology CT abdomen/pelvis without etiology for anemia.   Thrombocytopenia Acute. Platelets of 89,000 on admission, down slightly but is stabilized. -Daily CBC  Depression Anxiety Noted. Patient is not on medication management as an outpatient.  CAD Noted.  Primary hypertension Not on medication therapy. Blood pressure normotensive at this time.  Underweight Estimated body mass index is 14.66 kg/m as calculated from the following:   Height as of this encounter: 6\' 3"  (1.905 m).   Weight as of this encounter: 53.2 kg.   DVT prophylaxis: SCDs Code Status:   Code Status: Full Code Family Communication: None at bedside Disposition Plan: Discharge pending ability to advance diet and improvement of symptoms   Consultants:  General surgery  Procedures:  None  Antimicrobials: Ceftriaxone Flagyl    Subjective: Patient reports some nausea and vomiting yesterday after advancing his diet.  Patient also reports some worsening abdominal pain since then.  Abdominal pain is mostly in the upper abdomen in addition to some back pain.  Objective: BP 97/77 (BP Location: Left Arm)   Pulse 81   Temp 98 F (36.7 C) (Oral)   Resp 17   Ht 6\' 3"  (1.905 m)   Wt 53.2 kg   SpO2 100%   BMI 14.66 kg/m   Examination:  General exam: Appears calm and comfortable.  Thin appearing. Respiratory system: Clear to auscultation. Respiratory effort normal. Cardiovascular system: S1 & S2 heard, RRR. No murmurs, rubs, gallops or clicks. Gastrointestinal system: Abdomen is nondistended, soft and tender in upper abdominal area. Normal bowel sounds heard. Central nervous system: Alert and oriented. Musculoskeletal: No edema. No calf tenderness Psychiatry: Judgement and insight appear normal. Mood & affect appropriate.   Data  Reviewed:  I have personally reviewed following labs and imaging studies  CBC Lab Results  Component Value Date   WBC 9.5 04/21/2023   RBC 2.82 (L) 04/21/2023   HGB 8.5 (L) 04/21/2023   HCT 23.9 (L) 04/21/2023   MCV 84.8 04/21/2023   MCH 30.1 04/21/2023   PLT 100 (L) 04/21/2023   MCHC 35.6 04/21/2023   RDW 14.4 04/21/2023   LYMPHSABS 1.7 04/19/2023   MONOABS 1.4 (H) 04/19/2023   EOSABS 0.1 04/19/2023   BASOSABS 0.1 04/19/2023     Last metabolic panel Lab Results  Component Value Date   NA 128 (L) 04/21/2023   K 3.8 04/21/2023   CL 102 04/21/2023   CO2 20 (L) 04/21/2023   BUN <5 (L) 04/21/2023   CREATININE 0.50 (L) 04/21/2023   GLUCOSE 151 (H) 04/21/2023   GFRNONAA >60 04/21/2023   GFRAA 128 06/22/2020   CALCIUM 6.9 (L) 04/21/2023   PHOS 3.1 03/07/2021   PROT 4.6 (L) 04/18/2023   ALBUMIN 1.7 (L) 04/18/2023   LABGLOB 2.0 06/07/2019   AGRATIO 2.0 06/07/2019   BILITOT 1.4 (H) 04/18/2023   ALKPHOS 172 (H) 04/18/2023   AST 39 04/18/2023   ALT 32 04/18/2023   ANIONGAP 6 04/21/2023    GFR: Estimated Creatinine Clearance: 76.7 mL/min (A) (by C-G formula based on SCr of 0.5 mg/dL (L)).  Recent Results (from the past 240 hour(s))  Urine Culture (for pregnant, neutropenic or urologic patients or patients with an indwelling urinary catheter)     Status: Abnormal   Collection Time: 04/18/23 12:07 PM   Specimen: Urine, Clean Catch  Result Value Ref Range Status   Specimen Description URINE, CLEAN CATCH  Final   Special Requests   Final    NONE Performed at [facility], [address] [place], [place] [address]    Culture >=100,000 COLONIES/mL KLEBSIELLA PNEUMONIAE (A)  Final   Report Status 04/20/2023 FINAL  Final   Organism ID, Bacteria KLEBSIELLA PNEUMONIAE (A)  Final      Susceptibility   Klebsiella pneumoniae - MIC*    AMPICILLIN >=32 RESISTANT Resistant     CEFAZOLIN <=4 SENSITIVE Sensitive     CEFEPIME <=0.12 SENSITIVE Sensitive     CEFTRIAXONE <=0.25 SENSITIVE  Sensitive     CIPROFLOXACIN <=0.25 SENSITIVE Sensitive     GENTAMICIN <=1 SENSITIVE Sensitive     IMIPENEM <=0.25 SENSITIVE Sensitive     NITROFURANTOIN 64 INTERMEDIATE Intermediate     TRIMETH/SULFA <=20 SENSITIVE Sensitive     AMPICILLIN/SULBACTAM >=32 RESISTANT Resistant     PIP/TAZO 8 SENSITIVE Sensitive     * >=100,000 COLONIES/mL KLEBSIELLA PNEUMONIAE      Radiology Studies: No results found.    LOS: 3 days    Jacquelin Hawking, MD [facility] 04/21/2023, 10:50 AM   If 7PM-7AM, please contact night-coverage [email or web address]

## 2023-04-22 ENCOUNTER — Other Ambulatory Visit: Payer: Self-pay

## 2023-04-22 DIAGNOSIS — E871 Hypo-osmolality and hyponatremia: Secondary | ICD-10-CM | POA: Diagnosis not present

## 2023-04-22 LAB — COMPREHENSIVE METABOLIC PANEL
ALT: 22 U/L (ref 0–44)
AST: 26 U/L (ref 15–41)
Albumin: 1.6 g/dL — ABNORMAL LOW (ref 3.5–5.0)
Alkaline Phosphatase: 139 U/L — ABNORMAL HIGH (ref 38–126)
Anion gap: 8 (ref 5–15)
BUN: <5 mg/dL — ABNORMAL LOW (ref 6–20)
CO2: 22 mmol/L (ref 22–32)
Calcium: 7.4 mg/dL — ABNORMAL LOW (ref 8.9–10.3)
Chloride: 102 mmol/L (ref 98–111)
Creatinine, Ser: 0.54 mg/dL — ABNORMAL LOW (ref 0.61–1.24)
GFR, Estimated: >60 mL/min (ref >60)
Glucose, Bld: 140 mg/dL — ABNORMAL HIGH (ref 70–99)
Potassium: 3.2 mmol/L — ABNORMAL LOW (ref 3.5–5.1)
Sodium: 132 mmol/L — ABNORMAL LOW (ref 135–145)
Total Bilirubin: 1.5 mg/dL — ABNORMAL HIGH (ref 0.3–1.2)
Total Protein: 4.3 g/dL — ABNORMAL LOW (ref 6.5–8.1)

## 2023-04-22 LAB — BASIC METABOLIC PANEL
Anion gap: 8 (ref 5–15)
BUN: <5 mg/dL — ABNORMAL LOW (ref 6–20)
CO2: 18 mmol/L — ABNORMAL LOW (ref 22–32)
Calcium: 6.4 mg/dL — CL (ref 8.9–10.3)
Chloride: 104 mmol/L (ref 98–111)
Creatinine, Ser: 0.52 mg/dL — ABNORMAL LOW (ref 0.61–1.24)
GFR, Estimated: >60 mL/min (ref >60)
Glucose, Bld: 149 mg/dL — ABNORMAL HIGH (ref 70–99)
Potassium: 3.2 mmol/L — ABNORMAL LOW (ref 3.5–5.1)
Sodium: 130 mmol/L — ABNORMAL LOW (ref 135–145)

## 2023-04-22 LAB — MAGNESIUM
Magnesium: 1.4 mg/dL — ABNORMAL LOW (ref 1.7–2.4)
Magnesium: 1.8 mg/dL (ref 1.7–2.4)

## 2023-04-22 LAB — PHOSPHORUS
Phosphorus: <1 mg/dL — CL (ref 2.5–4.6)
Phosphorus: 1.6 mg/dL — ABNORMAL LOW (ref 2.5–4.6)

## 2023-04-22 LAB — CBC
HCT: 24.5 % — ABNORMAL LOW (ref 39.0–52.0)
Hemoglobin: 8.7 g/dL — ABNORMAL LOW (ref 13.0–17.0)
MCH: 29.9 pg (ref 26.0–34.0)
MCHC: 35.5 g/dL (ref 30.0–36.0)
MCV: 84.2 fL (ref 80.0–100.0)
Platelets: 156 10*3/uL (ref 150–400)
RBC: 2.91 MIL/uL — ABNORMAL LOW (ref 4.22–5.81)
RDW: 15.5 % (ref 11.5–15.5)
WBC: 14.2 10*3/uL — ABNORMAL HIGH (ref 4.0–10.5)
nRBC: 0 % (ref 0.0–0.2)

## 2023-04-22 LAB — PREALBUMIN: Prealbumin: <5 mg/dL — ABNORMAL LOW (ref 18–38)

## 2023-04-22 LAB — GLUCOSE, CAPILLARY: Glucose-Capillary: 136 mg/dL — ABNORMAL HIGH (ref 70–99)

## 2023-04-22 MED ORDER — INSULIN ASPART 100 UNIT/ML IJ SOLN
0.0000 [IU] | Freq: Four times a day (QID) | INTRAMUSCULAR | Status: DC
  Administered 2023-04-22: 1 [IU] via SUBCUTANEOUS
  Administered 2023-04-23 (×2): 2 [IU] via SUBCUTANEOUS

## 2023-04-22 MED ORDER — MAGNESIUM SULFATE 2 GM/50ML IV SOLN
2.0000 g | Freq: Once | INTRAVENOUS | Status: AC
  Administered 2023-04-22: 2 g via INTRAVENOUS
  Filled 2023-04-22: qty 50

## 2023-04-22 MED ORDER — THIAMINE HCL 100 MG/ML IJ SOLN
100.0000 mg | Freq: Once | INTRAMUSCULAR | Status: AC
  Administered 2023-04-22: 100 mg via INTRAVENOUS
  Filled 2023-04-22: qty 2

## 2023-04-22 MED ORDER — POTASSIUM CHLORIDE 10 MEQ/100ML IV SOLN
10.0000 meq | INTRAVENOUS | Status: AC
  Administered 2023-04-22 (×3): 10 meq via INTRAVENOUS
  Filled 2023-04-22 (×2): qty 100

## 2023-04-22 MED ORDER — POTASSIUM PHOSPHATES 15 MMOLE/5ML IV SOLN
30.0000 mmol | Freq: Once | INTRAVENOUS | Status: AC
  Administered 2023-04-22: 30 mmol via INTRAVENOUS
  Filled 2023-04-22: qty 10

## 2023-04-22 MED ORDER — MAGNESIUM SULFATE 4 GM/100ML IV SOLN
4.0000 g | Freq: Once | INTRAVENOUS | Status: AC
  Administered 2023-04-22: 4 g via INTRAVENOUS
  Filled 2023-04-22: qty 100

## 2023-04-22 MED ORDER — SODIUM CHLORIDE 0.9 % IV SOLN: INTRAVENOUS | Status: AC

## 2023-04-22 MED ORDER — THIAMINE HCL 100 MG/ML IJ SOLN
100.0000 mg | Freq: Every day | INTRAMUSCULAR | Status: DC
  Administered 2023-04-23 – 2023-04-24 (×2): 100 mg via INTRAVENOUS
  Filled 2023-04-22 (×2): qty 2

## 2023-04-22 MED ORDER — TRAVASOL 10 % IV SOLN
INTRAVENOUS | Status: AC
  Filled 2023-04-22: qty 356.4

## 2023-04-22 MED ORDER — POTASSIUM CHLORIDE 10 MEQ/100ML IV SOLN
INTRAVENOUS | Status: AC
  Administered 2023-04-22: 10 meq
  Filled 2023-04-22: qty 100

## 2023-04-22 MED ORDER — PANTOPRAZOLE SODIUM 40 MG IV SOLR
40.0000 mg | Freq: Every day | INTRAVENOUS | Status: DC
  Administered 2023-04-22 – 2023-05-21 (×30): 40 mg via INTRAVENOUS
  Filled 2023-04-22 (×28): qty 10

## 2023-04-22 NOTE — Progress Notes (Addendum)
PHARMACY - TOTAL PARENTERAL NUTRITION CONSULT NOTE  Indication: Gastrocolic fistula   Patient Measurements: Height: 6\' 3"  (190.5 cm) Weight: 62.2 kg (137 lb 2 oz) IBW/kg (Calculated) : 84.5 TPN AdjBW (KG): 53.2 Body mass index is 17.14 kg/m. 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  Patient presented on 7/19 with N/V x 4 days along with LLQ pain.  Per documentation, CT showed thickening of the distal transverse colon, where there is a complex fistulous connection between the transverse colon to the gastrojejunal anastomosis and adjacent small bowel loops.  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  Pharmacy consulted to manage TPN.  Patient was started on CLD on 7/22 and reported fullness/abdominal pain after eating.  He reports not eating for a week PTA.  He does not weigh himself, but he knows he has lost a lot of weight; his pants size reduced from a 36 to a 28 over a 6 month time frame.  He typically eats 2 meals a day (lunch and dinner) and he eats protein, vegetables and carb; he rarely eats fruits.  Glucose / Insulin: no hx DM - AM glucose < 180 Electrolytes: low Na, K 3.2, Phos < 1, Mag 1.4, others WNL Renal: SCr < 1, BUN < 5 Hepatic: AST/ALT WNL, alk phos mildly elevated at 139, tbili 1.5, albumin 1.6 Intake / Output; MIVF: UOP 0.2 ml/kg/hr, NS 100 ml/hr GI Imaging: none since TPN initiation GI Surgeries / Procedures: none since TPN initiation  Central access: PICC placed 04/21/23 TPN start date: 04/22/23  Nutritional Goals:  RD Assessment pending   PharmD estimated needs:  1850-2050 kCal, 90-105g AA per day  Current Nutrition:  Clear liquids Resource Breeze TID - 1 charted given yesterday  Plan:  Thiamine 100mg  IV x 1 prior to TPN initiation due to refeeding risk Recheck labs this PM prior to TPN  initiation  Start TPN at 30 ml/hr at 1800 (goal rate ~80 ml/hr)  TPN will provide 36g AA, 104g CHO, 22g ILE and 714 kCal, meeting ~35% of needs Electrolytes in TPN: Na 100 mEq/L, K 70 mEq/L, Ca 5 mEq/L, Mag 10 mEq/L, Phos 30 mmol/L, Cl:Ac 1:1 - adjust electrolytes down as TPN rate increases if appropriate Add daily multivitamin and trace elements in TPN Start sensitive SSI Q6H Thiamine 100mg  IV daily x 4 more doses or until TPN is at goal rate Reduce NS to 70 ml/hr when TPN starts KPhos IV per MD Mag sulfate 4gm IV  Standard TPN labs on Mon/Thurs - labs in AM  Alisia Vanengen D. Laney Potash, PharmD, BCPS, BCCCP 04/22/2023, 9:21 AM

## 2023-04-22 NOTE — Progress Notes (Signed)
Initial Nutrition Assessment  DOCUMENTATION CODES:   Severe malnutrition in context of chronic illness, Underweight  INTERVENTION:  Start TPN, pharmacy to manage  RD offered ONS, but patient declined all of them   Monitor electrolytes (mag, phos, K+) for signs of refeeding  -patient at high risk of refeeding  -MD to replete electrolytes as necessary   Advance diet as tolerated   Educate patient on importance of adequate nutrition, and encouraged him to come to the hospital sooner if he is having N/V   D/c Boost Breeze po TID, each supplement provides 250 kcal and 9 grams of protein    NUTRITION DIAGNOSIS:   Severe Malnutrition related to chronic illness as evidenced by severe fat depletion, severe muscle depletion.  GOAL:   Patient will meet greater than or equal to 90% of their needs  MONITOR:   PO intake, Diet advancement, Labs, Weight trends, Skin, I & O's  REASON FOR ASSESSMENT:   Consult New TPN/TNA  ASSESSMENT:   57 y.o. 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 Patient found to have  fistulous connections to GJ junction and small bowel  Several days of N/V/D and lower quadrant abdominal pain for several days PTA    Imaging shows colitis, wall thickening in the  descending, sigmoid and transverse colon with fistulous connections to GJ junction and small bowel  -starting TPN, plans for surgery in 2-3 weeks per Surgery   Visited patient at bedside who reports he was eating regularly a few weeks PTA. Then he started having abdominal pain, N/V for 2 weeks PTA. Patient reports he was trying to tough it through but then the pain started radiating to his back which is when he decided to come in. Patient reports strong dislike for protein ONS.   He reports having TPN  before due to surgical hx of Whipple in 2015. Patient reports he lives by himself, quite independently.   He is unaware of his UBW at this time and reports he weighed 185# prior to Whipple in 2015 and 155# in 2016.   Patient requesting broth, RD provided at bedside   Labs: Na 132, K+ 3.2, Glu 140, Cr 0.54, phos 1, mag 1.4, alk phos 139 Meds: rocephin, Boost Breeze TID, insulin, flagyl, potassium phosphate, NS, magnesium sulfate  Wt: admit-117# I/O's: +3.9 L    NUTRITION - FOCUSED PHYSICAL EXAM:  Flowsheet Row Most Recent Value  Orbital Region Severe depletion  Upper Arm Region Severe depletion  Thoracic and Lumbar Region Severe depletion  Buccal Region Severe depletion  Temple Region Severe depletion  Clavicle Bone Region Severe depletion  Clavicle and Acromion Bone Region Severe depletion  Scapular Bone Region Unable to assess  Dorsal Hand Severe depletion  Patellar Region Severe depletion  Anterior Thigh Region Severe depletion  Posterior Calf Region Severe depletion  Edema (RD Assessment) None  Hair Reviewed  Eyes Reviewed  Mouth Reviewed  Skin Reviewed  Nails Reviewed       Diet Order:   Diet Order             Diet clear liquid Room service appropriate? No; Fluid consistency: Thin  Diet effective now                   EDUCATION NEEDS:   Education needs have been addressed  Skin:  Skin Assessment: Reviewed RN Assessment  Last BM:  7/21  Height:   Ht Readings from Last 1 Encounters:  04/18/23 6\' 3"  (1.905 m)    Weight:   Wt Readings from Last 1 Encounters:  04/22/23 62.5 kg    Ideal Body Weight:     BMI:  Body mass index is 17.22 kg/m.  Estimated Nutritional Needs:   Kcal:  5956-3875  Protein:  95-105  Fluid:  >/= 2L    Leodis Rains, RDN, LDN  Clinical Nutrition

## 2023-04-22 NOTE — Progress Notes (Signed)
PROGRESS NOTE    Brent Rogers  WGN:[id number] DOB: 1966/03/25 DOA: 04/17/2023 PCP: Marcine Matar, MD   Brief Narrative: No notes on file   Assessment and Plan:  57 year old male with history of hypertension, GERD, PUD, CAD, depression, anxiety, chronic pain, anemia, s/p partial pancreatectomy, s/p cholecystectomy presents with nausea, vomiting, abdominal pain for the last several days also associated with the diarrhea, pain in the left lower quadrant.  The pain is worse with urinating, having bowel movement.  Patient had significant weight loss, could not quantify..  Workup in the emergency department patient with low sodium of 124, elevated LFTs with a total bilirubin of 2, ALT 47, AST 63, ALP 258, elevated white blood cell count of 12.3, thrombocytopenia 89.  Urinalysis positive for urinary tract infection.  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  General surgery is consulted, recommended to continue with a clear liquid diet, anticipates surgery.  Patient is placed on Rocephin, Flagyl  Gastrocolonic fistula -Continue with clear liquid diet per general surgery recommendations -Appreciate general surgery follow-up and recommendations -Anticipated surgery -Continue with Rocephin, Flagyl.  Urinary tract infection -Urinalysis is consistent with UTI -Continue with the current antibiotic regimen.  Hyponatremia -Hypovolemic hyponatremia -Continue with IV fluids -Sodium trending up 123-129-132  Hypoglycemia -Currently on D5W -Secondary to decreased p.o. intake.  Hypokalemia -Replacing 0 IV fluids.  Hypocalcemia -Corrected calcium is 9.2.  History of pancreatic mass -S/p Whipple procedure  Severe protein calorie malnutrition -If unable to start the patient on diet, start the patient on TPN.  Chronic pain -Continue the current regimen.  Anemia -Get iron  profile, B12, folate RBC, reticulocyte count looking into any correctable causes.  Elevated LFTs -Get right upper quadrant ultrasound  Depression/anxiety -Continue with the current regimen.  Insomnia -Add Ambien at nighttime.  CAD -Continue with aspirin, statin.  Thrombocytopenia -Closely follow-up with platelet count -No signs of bleeding.  Hypertension -Not on any medications.  DVT prophylaxis: SCDs Code Status:   Code Status: Full Code Family Communication: None at bedside Disposition Plan: Discharge pending ability to advance diet and improvement of symptoms   Consultants:  General surgery  Procedures:  None  Antimicrobials: Ceftriaxone Flagyl    Subjective: Complaining of discomfort in the stomach.  Wants to eat food. Objective: BP 104/75 (BP Location: Left Arm)   Pulse 73   Temp 98.9 F (37.2 C) (Oral)   Resp 16   Ht 6\' 3"  (1.905 m)   Wt 62.5 kg   SpO2 99%   BMI 17.22 kg/m   Examination:  General exam: Appears calm and comfortable.  Thin appearing. Respiratory system: Clear to auscultation. Respiratory effort normal. Cardiovascular system: S1 & S2 heard, RRR. No murmurs, rubs, gallops or clicks. Gastrointestinal system: Abdomen is distended, mild fluid shift Central nervous system: Alert and oriented. Musculoskeletal: No edema. No calf tenderness Psychiatry: Judgement and insight appear normal. Mood & affect appropriate.   Data Reviewed: I have personally reviewed following labs and imaging studies  CBC Lab Results  Component Value Date   WBC 14.2 (H) 04/22/2023   RBC 2.91 (L) 04/22/2023   HGB 8.7 (L) 04/22/2023   HCT 24.5 (L) 04/22/2023   MCV 84.2 04/22/2023   MCH 29.9 04/22/2023   PLT 156 04/22/2023   MCHC 35.5 04/22/2023   RDW 15.5 04/22/2023   LYMPHSABS 1.7 04/19/2023   MONOABS 1.4 (H) 04/19/2023   EOSABS 0.1 04/19/2023  BASOSABS 0.1 04/19/2023     Last metabolic panel Lab Results  Component Value Date   NA 132 (L)  04/22/2023   K 3.2 (L) 04/22/2023   CL 102 04/22/2023   CO2 22 04/22/2023   BUN <5 (L) 04/22/2023   CREATININE 0.54 (L) 04/22/2023   GLUCOSE 140 (H) 04/22/2023   GFRNONAA >60 04/22/2023   GFRAA 128 06/22/2020   CALCIUM 7.4 (L) 04/22/2023   PHOS <1.0 (LL) 04/22/2023   PROT 4.3 (L) 04/22/2023   ALBUMIN 1.6 (L) 04/22/2023   LABGLOB 2.0 06/07/2019   AGRATIO 2.0 06/07/2019   BILITOT 1.5 (H) 04/22/2023   ALKPHOS 139 (H) 04/22/2023   AST 26 04/22/2023   ALT 22 04/22/2023   ANIONGAP 8 04/22/2023    GFR: Estimated Creatinine Clearance: 90.1 mL/min (A) (by C-G formula based on SCr of 0.54 mg/dL (L)).  Recent Results (from the past 240 hour(s))  Urine Culture (for pregnant, neutropenic or urologic patients or patients with an indwelling urinary catheter)     Status: Abnormal   Collection Time: 04/18/23 12:07 PM   Specimen: Urine, Clean Catch  Result Value Ref Range Status   Specimen Description URINE, CLEAN CATCH  Final   Special Requests   Final    NONE Performed at [facility], [address], [place], [place] [address]    Culture >=100,000 COLONIES/mL KLEBSIELLA PNEUMONIAE (A)  Final   Report Status 04/20/2023 FINAL  Final   Organism ID, Bacteria KLEBSIELLA PNEUMONIAE (A)  Final      Susceptibility   Klebsiella pneumoniae - MIC*    AMPICILLIN >=32 RESISTANT Resistant     CEFAZOLIN <=4 SENSITIVE Sensitive     CEFEPIME <=0.12 SENSITIVE Sensitive     CEFTRIAXONE <=0.25 SENSITIVE Sensitive     CIPROFLOXACIN <=0.25 SENSITIVE Sensitive     GENTAMICIN <=1 SENSITIVE Sensitive     IMIPENEM <=0.25 SENSITIVE Sensitive     NITROFURANTOIN 64 INTERMEDIATE Intermediate     TRIMETH/SULFA <=20 SENSITIVE Sensitive     AMPICILLIN/SULBACTAM >=32 RESISTANT Resistant     PIP/TAZO 8 SENSITIVE Sensitive     * >=100,000 COLONIES/mL KLEBSIELLA PNEUMONIAE      Radiology Studies: [place] EKG SITE RITE  Result Date: 04/22/2023 If Site Rite image not attached, placement could not be confirmed  due to current cardiac rhythm.  [place] EKG SITE RITE  Result Date: 04/21/2023 If [facility] image not attached, placement could not be confirmed due to current cardiac rhythm.  DG UGI W SINGLE CM (SOL OR THIN BA)  Result Date: 04/21/2023 CLINICAL DATA:  Colo-enteric fistula. EXAM: WATER SOLUBLE UPPER GI SERIES TECHNIQUE: Single-column upper GI series was performed using water soluble contrast. Radiation Exposure Index (as provided by the fluoroscopic device): 100.3 mGy Kerma CONTRAST:  80mL OMNIPAQUE IOHEXOL 300 MG/ML  SOLN COMPARISON:  CT scan of April 17, 2023. FINDINGS: Contrast flowed easily through the visualized distal esophagus into the stomach. Patient is status post gastrojejunostomy. Filling of the jejunum and proximal small bowel loops is noted. There is also noted filling of what appears to be the distal transverse colon, consistent with coloenteric fistula at as described on prior CT scan. IMPRESSION: Status post gastrojejunostomy. There is also noted probable contrast filling of distal transverse colon consistent with coloenteric fistula as noted on prior CT scan. Electronically Signed   By: Lupita Raider M.D.   On: 04/21/2023 12:36      LOS: 4 days    Mariell Nester,MD [facility] 04/22/2023, 9:35 AM  If 7PM-7AM, please contact night-coverage [email or web address]

## 2023-04-22 NOTE — Progress Notes (Signed)
Critical lab reported to RN; calcium 6.4.  Attending MD made aware and new orders received.

## 2023-04-22 NOTE — Progress Notes (Signed)
Mobility Specialist Progress Note:   04/22/23 1211  Mobility  Activity Ambulated with assistance in room  Level of Assistance Minimal assist, patient does 75% or more  Assistive Device Front wheel walker  Distance Ambulated (ft) 4 ft  Activity Response Tolerated fair  Mobility Referral Yes  $Mobility charge 1 Mobility  Mobility Specialist Start Time (ACUTE ONLY) 1155  Mobility Specialist Stop Time (ACUTE ONLY) 1205  Mobility Specialist Time Calculation (min) (ACUTE ONLY) 10 min    Pre Mobility: 101/75 BP  Pt received in bed, agreeable to mobility. MinA for bed mobility and to stand. Pt able to take two steps from bedside but unable to continue d/t high pain in lower back. Pt returned to bed asymptomatic with call bell in hand and all needs met.       Leory Plowman  Mobility Specialist Please contact via [employer] office at [phone]

## 2023-04-22 NOTE — Progress Notes (Signed)
Text page Dr Imogene Burn Phos. Less then 1

## 2023-04-22 NOTE — Progress Notes (Signed)
Subjective/Chief Complaint: Doing ok this morning, no changes   Objective: Vital signs in last 24 hours: Temp:  [98 F (36.7 C)-98.9 F (37.2 C)] 98.9 F (37.2 C) (07/24 0834) Pulse Rate:  [64-86] 73 (07/24 0834) Resp:  [15-20] 16 (07/24 0423) BP: (87-111)/(62-82) 104/75 (07/24 0834) SpO2:  [96 %-100 %] 99 % (07/24 0834) Weight:  [62.2 kg-62.5 kg] 62.5 kg (07/24 0834) Last BM Date : 04/19/23  Intake/Output from previous day: 07/23 0701 - 07/24 0700 In: 1680 [P.O.:480; I.V.:1100; IV Piggyback:100] Out: 300 [Urine:300] Intake/Output this shift: No intake/output data recorded.  Ab soft nontender  Lab Results:  Recent Labs    04/21/23 0113 04/22/23 0355  WBC 9.5 14.2*  HGB 8.5* 8.7*  HCT 23.9* 24.5*  PLT 100* 156   BMET Recent Labs    04/21/23 0113 04/22/23 0355  NA 128* 132*  K 3.8 3.2*  CL 102 102  CO2 20* 22  GLUCOSE 151* 140*  BUN <5* <5*  CREATININE 0.50* 0.54*  CALCIUM 6.9* 7.4*   PT/INR No results for input(s): "LABPROT", "INR" in the last 72 hours. ABG No results for input(s): "PHART", "HCO3" in the last 72 hours.  Invalid input(s): "PCO2", "PO2"  Studies/Results: [place] EKG SITE RITE  Result Date: 04/22/2023 If Site Rite image not attached, placement could not be confirmed due to current cardiac rhythm.  [place] EKG SITE RITE  Result Date: 04/21/2023 If [facility] image not attached, placement could not be confirmed due to current cardiac rhythm.  DG UGI W SINGLE CM (SOL OR THIN BA)  Result Date: 04/21/2023 CLINICAL DATA:  Colo-enteric fistula. EXAM: WATER SOLUBLE UPPER GI SERIES TECHNIQUE: Single-column upper GI series was performed using water soluble contrast. Radiation Exposure Index (as provided by the fluoroscopic device): 100.3 mGy Kerma CONTRAST:  80mL OMNIPAQUE IOHEXOL 300 MG/ML  SOLN COMPARISON:  CT scan of April 17, 2023. FINDINGS: Contrast flowed easily through the visualized distal esophagus into the stomach. Patient is status post  gastrojejunostomy. Filling of the jejunum and proximal small bowel loops is noted. There is also noted filling of what appears to be the distal transverse colon, consistent with coloenteric fistula at as described on prior CT scan. IMPRESSION: Status post gastrojejunostomy. There is also noted probable contrast filling of distal transverse colon consistent with coloenteric fistula as noted on prior CT scan. Electronically Signed   By: Lupita Raider M.D.   On: 04/21/2023 12:36    Anti-infectives: Anti-infectives (From admission, onward)    Start     Dose/Rate Route Frequency Ordered Stop   04/20/23 1200  cefTRIAXone (ROCEPHIN) 2 g in sodium chloride 0.9 % 100 mL IVPB        2 g 200 mL/hr over 30 Minutes Intravenous Every 24 hours 04/20/23 1133     04/18/23 1600  cefTRIAXone (ROCEPHIN) 2 g in sodium chloride 0.9 % 100 mL IVPB  Status:  Discontinued        2 g 200 mL/hr over 30 Minutes Intravenous Every 24 hours 04/17/23 1807 04/20/23 1133   04/18/23 0500  metroNIDAZOLE (FLAGYL) IVPB 500 mg        500 mg 100 mL/hr over 60 Minutes Intravenous Every 12 hours 04/17/23 1807     04/17/23 1600  cefTRIAXone (ROCEPHIN) 1 g in sodium chloride 0.9 % 100 mL IVPB        1 g 200 mL/hr over 30 Minutes Intravenous  Once 04/17/23 1550 04/17/23 1738   04/17/23 1600  metroNIDAZOLE (FLAGYL) IVPB 500  mg        500 mg 100 mL/hr over 60 Minutes Intravenous  Once 04/17/23 1550 04/17/23 1747       Assessment/Plan: 64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  This will almost certainly not heal on own but with current ntn not able to undergo surgery.  Will start tpn and have picc placed today.  Surgery next 2-3 weeks depending on response.   FEN - CLD, IVF per TRH, start TPN VTE - SCDs ID - Rocephin/Flagyl   - Per TRH -  UTI Hyponatremia  HTN CAD  Emelia Loron 04/22/2023

## 2023-04-23 ENCOUNTER — Inpatient Hospital Stay (HOSPITAL_COMMUNITY): Payer: BLUE CROSS/BLUE SHIELD

## 2023-04-23 DIAGNOSIS — E871 Hypo-osmolality and hyponatremia: Secondary | ICD-10-CM | POA: Diagnosis not present

## 2023-04-23 LAB — BLOOD GAS, ARTERIAL
Acid-base deficit: 6.2 mmol/L — ABNORMAL HIGH (ref 0.0–2.0)
Bicarbonate: 17.9 mmol/L — ABNORMAL LOW (ref 20.0–28.0)
Drawn by: 362771
O2 Saturation: 94.8 %
Patient temperature: 36.7
pCO2 arterial: 31 mmHg — ABNORMAL LOW (ref 32–48)
pH, Arterial: 7.37 (ref 7.35–7.45)
pO2, Arterial: 71 mmHg — ABNORMAL LOW (ref 83–108)

## 2023-04-23 LAB — COMPREHENSIVE METABOLIC PANEL
ALT: 21 U/L (ref 0–44)
AST: 26 U/L (ref 15–41)
Alkaline Phosphatase: 142 U/L — ABNORMAL HIGH (ref 38–126)
Anion gap: 8 (ref 5–15)
BUN: <5 mg/dL — ABNORMAL LOW (ref 6–20)
CO2: 20 mmol/L — ABNORMAL LOW (ref 22–32)
Calcium: 7.2 mg/dL — ABNORMAL LOW (ref 8.9–10.3)
Chloride: 100 mmol/L (ref 98–111)
GFR, Estimated: >60 mL/min (ref >60)
Glucose, Bld: 109 mg/dL — ABNORMAL HIGH (ref 70–99)
Potassium: 4.1 mmol/L (ref 3.5–5.1)
Sodium: 128 mmol/L — ABNORMAL LOW (ref 135–145)
Total Bilirubin: 1.3 mg/dL — ABNORMAL HIGH (ref 0.3–1.2)
Total Protein: 4.5 g/dL — ABNORMAL LOW (ref 6.5–8.1)

## 2023-04-23 LAB — CBC
HCT: 25.1 % — ABNORMAL LOW (ref 39.0–52.0)
HCT: 26.4 % — ABNORMAL LOW (ref 39.0–52.0)
Hemoglobin: 9.1 g/dL — ABNORMAL LOW (ref 13.0–17.0)
Hemoglobin: 9.5 g/dL — ABNORMAL LOW (ref 13.0–17.0)
MCH: 30.6 pg (ref 26.0–34.0)
MCH: 30.9 pg (ref 26.0–34.0)
MCHC: 36.3 g/dL — ABNORMAL HIGH (ref 30.0–36.0)
MCHC: 36 g/dL (ref 30.0–36.0)
MCV: 84.5 fL (ref 80.0–100.0)
MCV: 86 fL (ref 80.0–100.0)
Platelets: 148 10*3/uL — ABNORMAL LOW (ref 150–400)
Platelets: 154 10*3/uL (ref 150–400)
RBC: 2.97 MIL/uL — ABNORMAL LOW (ref 4.22–5.81)
RBC: 3.07 MIL/uL — ABNORMAL LOW (ref 4.22–5.81)
RDW: 15.8 % — ABNORMAL HIGH (ref 11.5–15.5)
RDW: 16.4 % — ABNORMAL HIGH (ref 11.5–15.5)
WBC: 33.5 10*3/uL — ABNORMAL HIGH (ref 4.0–10.5)
WBC: 36.3 10*3/uL — ABNORMAL HIGH (ref 4.0–10.5)
nRBC: 0.1 % (ref 0.0–0.2)
nRBC: 0.1 % (ref 0.0–0.2)

## 2023-04-23 LAB — BASIC METABOLIC PANEL
Anion gap: 8 (ref 5–15)
BUN: <5 mg/dL — ABNORMAL LOW (ref 6–20)
CO2: 19 mmol/L — ABNORMAL LOW (ref 22–32)
Calcium: 6.7 mg/dL — ABNORMAL LOW (ref 8.9–10.3)
Chloride: 101 mmol/L (ref 98–111)
Creatinine, Ser: 0.5 mg/dL — ABNORMAL LOW (ref 0.61–1.24)
GFR, Estimated: >60 mL/min (ref >60)
Glucose, Bld: 309 mg/dL — ABNORMAL HIGH (ref 70–99)
Potassium: 6 mmol/L — ABNORMAL HIGH (ref 3.5–5.1)
Sodium: 128 mmol/L — ABNORMAL LOW (ref 135–145)

## 2023-04-23 LAB — MAGNESIUM
Magnesium: 1.8 mg/dL (ref 1.7–2.4)
Magnesium: 2.6 mg/dL — ABNORMAL HIGH (ref 1.7–2.4)

## 2023-04-23 LAB — GLUCOSE, CAPILLARY
Glucose-Capillary: 119 mg/dL — ABNORMAL HIGH (ref 70–99)
Glucose-Capillary: 152 mg/dL — ABNORMAL HIGH (ref 70–99)
Glucose-Capillary: 180 mg/dL — ABNORMAL HIGH (ref 70–99)
Glucose-Capillary: 84 mg/dL (ref 70–99)

## 2023-04-23 LAB — APTT: aPTT: 38 seconds — ABNORMAL HIGH (ref 24–36)

## 2023-04-23 LAB — LACTIC ACID, PLASMA: Lactic Acid, Venous: 3.9 mmol/L (ref 0.5–1.9)

## 2023-04-23 LAB — PROTIME-INR
INR: 1.6 — ABNORMAL HIGH (ref 0.8–1.2)
Prothrombin Time: 19.2 seconds — ABNORMAL HIGH (ref 11.4–15.2)

## 2023-04-23 LAB — PHOSPHORUS: Phosphorus: 1.6 mg/dL — ABNORMAL LOW (ref 2.5–4.6)

## 2023-04-23 MED ORDER — SODIUM CHLORIDE 0.9 % IV BOLUS
1000.0000 mL | Freq: Once | INTRAVENOUS | Status: AC
  Administered 2023-04-23: 1000 mL via INTRAVENOUS

## 2023-04-23 MED ORDER — SODIUM CHLORIDE 0.9 % IV SOLN: INTRAVENOUS | Status: AC

## 2023-04-23 MED ORDER — PIPERACILLIN-TAZOBACTAM 3.375 G IVPB
3.3750 g | Freq: Three times a day (TID) | INTRAVENOUS | Status: DC
  Administered 2023-04-23 – 2023-04-28 (×15): 3.375 g via INTRAVENOUS
  Filled 2023-04-23 (×22): qty 50

## 2023-04-23 MED ORDER — INSULIN ASPART 100 UNIT/ML IJ SOLN
0.0000 [IU] | Freq: Four times a day (QID) | INTRAMUSCULAR | Status: DC
  Administered 2023-04-24 (×3): 3 [IU] via SUBCUTANEOUS
  Administered 2023-04-25: 2 [IU] via SUBCUTANEOUS
  Administered 2023-04-25: 8 [IU] via SUBCUTANEOUS

## 2023-04-23 MED ORDER — ALBUTEROL SULFATE (2.5 MG/3ML) 0.083% IN NEBU
INHALATION_SOLUTION | RESPIRATORY_TRACT | Status: AC
  Administered 2023-04-23: 2.5 mg
  Filled 2023-04-23: qty 3

## 2023-04-23 MED ORDER — METOPROLOL TARTRATE 5 MG/5ML IV SOLN
INTRAVENOUS | Status: AC
  Filled 2023-04-23: qty 5

## 2023-04-23 MED ORDER — MAGNESIUM SULFATE 4 GM/100ML IV SOLN
4.0000 g | Freq: Once | INTRAVENOUS | Status: AC
  Administered 2023-04-23: 4 g via INTRAVENOUS
  Filled 2023-04-23: qty 100

## 2023-04-23 MED ORDER — GUAIFENESIN-DM 100-10 MG/5ML PO SYRP
5.0000 mL | ORAL_SOLUTION | ORAL | Status: DC | PRN
  Administered 2023-04-23 (×2): 5 mL via ORAL
  Filled 2023-04-23 (×2): qty 5

## 2023-04-23 MED ORDER — ALBUTEROL SULFATE (2.5 MG/3ML) 0.083% IN NEBU
2.5000 mg | INHALATION_SOLUTION | Freq: Four times a day (QID) | RESPIRATORY_TRACT | Status: DC | PRN
  Administered 2023-05-02 – 2023-05-04 (×3): 2.5 mg via RESPIRATORY_TRACT
  Filled 2023-04-23 (×3): qty 3

## 2023-04-23 MED ORDER — TRAVASOL 10 % IV SOLN
INTRAVENOUS | Status: AC
  Filled 2023-04-23: qty 600

## 2023-04-23 MED ORDER — METOPROLOL TARTRATE 5 MG/5ML IV SOLN
5.0000 mg | INTRAVENOUS | Status: AC
  Administered 2023-04-23: 5 mg via INTRAVENOUS

## 2023-04-23 MED ORDER — IOHEXOL 300 MG/ML  SOLN
100.0000 mL | Freq: Once | INTRAMUSCULAR | Status: AC | PRN
  Administered 2023-04-23: 100 mL via INTRAVENOUS

## 2023-04-23 MED ORDER — SODIUM CHLORIDE 0.9 % IV SOLN
45.0000 mmol | Freq: Once | INTRAVENOUS | Status: AC
  Administered 2023-04-23: 45 mmol via INTRAVENOUS
  Filled 2023-04-23: qty 15

## 2023-04-23 NOTE — Progress Notes (Signed)
PHARMACY - TOTAL PARENTERAL NUTRITION CONSULT NOTE  Indication: Gastrocolic fistula   Patient Measurements: Height: 6\' 3"  (190.5 cm) Weight: 64.5 kg (142 lb 3.2 oz) IBW/kg (Calculated) : 84.5 TPN AdjBW (KG): 53.2 Body mass index is 17.77 kg/m. 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  Patient presented on 7/19 with N/V x 4 days along with LLQ pain.  Per documentation, CT showed thickening of the distal transverse colon, where there is a complex fistulous connection between the transverse colon to the gastrojejunal anastomosis and adjacent small bowel loops.  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  Pharmacy consulted to manage TPN.  Patient was started on CLD on 7/22 and reported fullness/abdominal pain after eating.  He reports not eating for a week PTA.  He does not weigh himself, but he knows he has lost a lot of weight; his pants size reduced from a 36 to a 28 over a 6 month time frame.  He typically eats 2 meals a day (lunch and dinner) and he eats protein, vegetables and carb; he rarely eats fruits.  Glucose / Insulin: no hx DM - CBGs acceptable.  Used 1 unit SSI yesterday. Electrolytes: low Na/CO2, K 4.1 and Phos up to 1.6 post KCL x 3 runs and KPhos , Mag up to 1.8 post 6gm, others WNL Renal: SCr < 1, BUN < 5 Hepatic: AST/ALT WNL, alk phos mildly elevated at 142, tbili down to 1.3, albumin 1.6, prealbumin < 5 on 7/24 Intake / Output; MIVF: UOP 0.2 ml/kg/hr, NS 70 ml/hr GI Imaging: none since TPN initiation GI Surgeries / Procedures: none since TPN initiation  Central access: PICC placed 04/21/23 TPN start date: 04/22/23  Nutritional Goals:  RD Estimated Needs Total Energy Estimated Needs: 1610-9604 Total Protein Estimated Needs: 95-105 Total Fluid Estimated Needs: >/= 2L  Current Nutrition:  Clear  liquids TPN  Plan:  Increase TPN slightly to 50 ml/hr at 1800 (goal rate 80 ml/hr)  TPN will provide 60g AA, 174g CHO, 36g ILE and 1192 kCal, meeting ~60% of needs Electrolytes in TPN: Na 120 mEq/L, K 60 mEq/L, Ca 5 mEq/L, Mag 10 mEq/L, Phos 30 mmol/L, Cl:Ac 1:2 - adjust electrolytes down as TPN rate increases if appropriate Add daily multivitamin and trace elements in TPN Increase to moderate SSI Q6H Thiamine 100mg  IV daily x 5 days total or until TPN is at goal rate given refeeding Reduce NS to 50 ml/hr when new TPN bag starts KPhos IV  Mag sulfate 4gm IV  Standard TPN labs on Mon/Thurs - labs in AM  Orien Mayhall D. Laney Potash, PharmD, BCPS, BCCCP 04/23/2023, 7:26 AM

## 2023-04-23 NOTE — Progress Notes (Signed)
Pt's HR was at controlled number between 90-100's after the Metoprolol was given, however SPO2 remained between 85-88%. RT was called for evaluation and put pt on Bipap. Rapid response was also called by RT for evaluation. MD was informed and ordered Cx Xray. Code sepsis was also initiated by Rapid Response RN. Pt is currently stable, VS stable, remains on Bipap.

## 2023-04-23 NOTE — Progress Notes (Addendum)
Receive pt this AM with high HR on Tele, Sinus tach. Reported that it started when pt had chest pain when ambulation this early morning. No PRN med was seen in the [facility]. Pt's pain is controlled. MD was informed for possible intervention. MD ordered bolus of NS 1L, change of abx.  04/23/23 1100  Provider Notification  Provider Name/Title Vasireddy P  Date Provider Notified 04/23/23  Time Provider Notified 1050  Method of Notification Page (secure chat)  Notification Reason Other (Comment) (HR between 110-120's)  Provider response Other (Comment) (pending response.)

## 2023-04-23 NOTE — Plan of Care (Signed)

## 2023-04-23 NOTE — Sepsis Progress Note (Signed)
Code sepsis called @ 1756. Antibiotics given @ 0646, 1131 and 1426. Fluids given @ 0924 and 1141. Text to floor RN to get lactic acid and blood cultures asap and to check BP every hour x 4

## 2023-04-23 NOTE — Sepsis Progress Note (Signed)
Elink monitoring for the code sepsis protocol.  

## 2023-04-23 NOTE — TOC Initial Note (Signed)
Transition of Care [facility]) - Initial/Assessment Note    Patient Details  Name: Brent Rogers MRN: [id number] Date of Birth: 1966/03/15  Transition of Care [facility]) CM/SW Contact:    Lockie Pares, RN Phone Number: 04/23/2023, 5:34 PM  Clinical Narrative:                 57 yo with colitis, no surgical intervention currently, consult for home TPN. Patient may have surgical intervention in 2-3 weeks, will need PICC and TPN in the meantime. Discussed with nursing, he is having some instability and definitely needs to be evaluated by PT. Will place order for AM. May need [facility] PT OT  as well as RN for TPN.  Contacted Pam from [employer] with initial consult. Will discuss further in AM. May have difficulty obtaining HH due to insurance.  Will await PT recommendation. Continuing to need pain control. Attempted to call patient to discuss, however unable at this time.  TOC will follow for needs, recommendations, and transitions of care.  Expected Discharge Plan: Home w Home Health Services Barriers to Discharge: Continued Medical Work up   Patient Goals and CMS Choice  HH vs SNF for deconditioned patient.           Expected Discharge Plan and Services   Discharge Planning Services: CM Consult   Living arrangements for the past 2 months: Apartment                                      Prior Living Arrangements/Services Living arrangements for the past 2 months: Apartment   Patient language and need for interpreter reviewed:: Yes        Need for Family Participation in Patient Care: Yes (Comment)     Criminal Activity/Legal Involvement Pertinent to Current Situation/Hospitalization: No - Comment as needed  Activities of Daily Living      Permission Sought/Granted                  Emotional Assessment       Orientation: : Oriented to Self, Oriented to  Time, Oriented to Place, Oriented to Situation   Psych Involvement: No (comment)  Admission diagnosis:   Colitis [K52.9] 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] Hyponatremia [E87.1] Urinary tract infection without hematuria, site unspecified [N39.0] Patient Active Problem List   Diagnosis Date Noted   Transaminitis 04/17/2023   Colitis 04/17/2023   UTI (urinary tract infection) 04/17/2023   Hyponatremia 04/17/2023   Elevated LFTs    Gastric ulcer without hemorrhage or perforation    Acute hepatitis 10/09/2021   Pressure injury of skin 09/26/2021   GI bleed 09/25/2021   Major depressive disorder, recurrent episode, severe (HCC) 09/04/2021   GAD (generalized anxiety disorder) 09/04/2021   Major depressive disorder, recurrent episode, moderate (HCC) 03/24/2021   Open wound of pharynx without complication 03/11/2021   Atherosclerosis of native coronary artery of native heart without angina pectoris    Blood in stool    Iron deficiency anemia    Syncope 03/06/2021   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 [place] [address] Phone: [phone] Fax: [phone]     Social Determinants of Health (SDOH) Social History: SDOH Screenings   Alcohol Screen: Low Risk  (09/03/2021)  Depression (PHQ2-9): Medium Risk (03/21/2021)  Tobacco Use: High Risk (04/17/2023)   SDOH Interventions:     Readmission Risk Interventions    10/14/2021    2:38 PM  Readmission Risk Prevention Plan  Transportation Screening  Complete  PCP or Specialist Appt within 3-5 Days Complete  HRI or Home Care Consult Complete  Social Work Consult for Recovery Care Planning/Counseling Complete  Palliative Care Screening Not Applicable  Medication Review Oceanographer) Complete

## 2023-04-23 NOTE — Progress Notes (Signed)
Lab had 2 failed stick for blood culture. Lab will contact her supervisor to draw the blood culture. Charge nurse will the rest of the STAT blood test. MD and rapid team RN were informed of the delay.

## 2023-04-23 NOTE — Progress Notes (Signed)
Patient transported to CT and back without any complications.  

## 2023-04-23 NOTE — Significant Event (Signed)
Rapid Response Event Note   Reason for Call :  Increased RR, shortness of breath and increased O2 needs  Initial Focused Assessment:  Patient is alert and oriented.  He states that he has been short of breath since this morning.  He is mildly labored and has increased RR.  Lung sounds clear Heart tones regular Abdomen mildly distended and firm  BP 121/89  SR/ST  96  RR 34  O2 sat 98% on Bipap  Oral temp 98.1  CBC, CMP and PCXR recently done 1L bolus today  Interventions:  RT drawing ABG Awaiting PCXR result Code Sepsis  Plan of Care:  Rn to call if patient becomes labored or if he becomes hypotensive or has a change in mentation.    Event Summary:   MD Notified: RN notified Dr Heron Nay prior to my arrival Call Time: 1635 Arrival Time: 1645 End Time: 1820  Marcellina Millin, RN

## 2023-04-23 NOTE — Progress Notes (Signed)
HR continues to run high 120-130's. MD was notified and ordered EKG, blood test Mg, CBC and BMP and IV Metoprolol. Pt also may need for breathing treatment, diminished lung sound and minimal crackles, he also verbalized he felt like he has "fluid in my lungs", MD was informed.  04/23/23 1518  Vitals  BP 121/89  MAP (mmHg) 100  BP Location Left Arm  BP Method Automatic  Patient Position (if appropriate) Lying  Pulse Rate Source Monitor  Resp (!) 24  MEWS COLOR  MEWS Score Color Green  MEWS Score  MEWS Temp 0  MEWS Systolic 0  MEWS Pulse 0  MEWS RR 1  MEWS LOC 0  MEWS Score 1

## 2023-04-23 NOTE — Progress Notes (Signed)
Subjective/Chief Complaint: Events overnight noted, weak, had a bm   Objective: Vital signs in last 24 hours: Temp:  [97.9 F (36.6 C)-99.8 F (37.7 C)] 99.8 F (37.7 C) (07/25 0831) Pulse Rate:  [68-101] 100 (07/25 0831) Resp:  [18-20] 20 (07/25 0831) BP: (91-131)/(70-103) 110/82 (07/25 0831) SpO2:  [93 %-100 %] 94 % (07/25 0831) Weight:  [64.5 kg] 64.5 kg (07/25 0646) Last BM Date : 04/19/23  Intake/Output from previous day: 07/24 0701 - 07/25 0700 In: -  Out: 300 [Urine:300] Intake/Output this shift: No intake/output data recorded.  Ab soft nontender nondistendedd  Lab Results:  Recent Labs    04/21/23 0113 04/22/23 0355  WBC 9.5 14.2*  HGB 8.5* 8.7*  HCT 23.9* 24.5*  PLT 100* 156   BMET Recent Labs    04/22/23 1450 04/23/23 0505  NA 130* 128*  K 3.2* 4.1  CL 104 100  CO2 18* 20*  GLUCOSE 149* 109*  BUN <5* <5*  CREATININE 0.52* 0.50*  CALCIUM 6.4* 7.2*   PT/INR No results for input(s): "LABPROT", "INR" in the last 72 hours. ABG No results for input(s): "PHART", "HCO3" in the last 72 hours.  Invalid input(s): "PCO2", "PO2"  Studies/Results: [place] EKG SITE RITE  Result Date: 04/22/2023 If Site Rite image not attached, placement could not be confirmed due to current cardiac rhythm.  [place] EKG SITE RITE  Result Date: 04/21/2023 If [facility] image not attached, placement could not be confirmed due to current cardiac rhythm.  DG UGI W SINGLE CM (SOL OR THIN BA)  Result Date: 04/21/2023 CLINICAL DATA:  Colo-enteric fistula. EXAM: WATER SOLUBLE UPPER GI SERIES TECHNIQUE: Single-column upper GI series was performed using water soluble contrast. Radiation Exposure Index (as provided by the fluoroscopic device): 100.3 mGy Kerma CONTRAST:  80mL OMNIPAQUE IOHEXOL 300 MG/ML  SOLN COMPARISON:  CT scan of April 17, 2023. FINDINGS: Contrast flowed easily through the visualized distal esophagus into the stomach. Patient is status post gastrojejunostomy. Filling of  the jejunum and proximal small bowel loops is noted. There is also noted filling of what appears to be the distal transverse colon, consistent with coloenteric fistula at as described on prior CT scan. IMPRESSION: Status post gastrojejunostomy. There is also noted probable contrast filling of distal transverse colon consistent with coloenteric fistula as noted on prior CT scan. Electronically Signed   By: Lupita Raider M.D.   On: 04/21/2023 12:36    Anti-infectives: Anti-infectives (From admission, onward)    Start     Dose/Rate Route Frequency Ordered Stop   04/20/23 1200  cefTRIAXone (ROCEPHIN) 2 g in sodium chloride 0.9 % 100 mL IVPB        2 g 200 mL/hr over 30 Minutes Intravenous Every 24 hours 04/20/23 1133     04/18/23 1600  cefTRIAXone (ROCEPHIN) 2 g in sodium chloride 0.9 % 100 mL IVPB  Status:  Discontinued        2 g 200 mL/hr over 30 Minutes Intravenous Every 24 hours 04/17/23 1807 04/20/23 1133   04/18/23 0500  metroNIDAZOLE (FLAGYL) IVPB 500 mg        500 mg 100 mL/hr over 60 Minutes Intravenous Every 12 hours 04/17/23 1807     04/17/23 1600  cefTRIAXone (ROCEPHIN) 1 g in sodium chloride 0.9 % 100 mL IVPB        1 g 200 mL/hr over 30 Minutes Intravenous  Once 04/17/23 1550 04/17/23 1738   04/17/23 1600  metroNIDAZOLE (FLAGYL) IVPB 500 mg  500 mg 100 mL/hr over 60 Minutes Intravenous  Once 04/17/23 1550 04/17/23 1747       Assessment/Plan: 35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  This will almost certainly not heal on own but with current ntn not able to undergo surgery.   Surgery next 2-3 weeks depending on response. -continue TPN, ideally could cycle this at night as he could actually go home on clears and TPN prior to having surgery- this may take some time to improve   FEN - CLD, IVF per TRH, start TPN VTE - SCDs ID - Rocephin/Flagyl   - Per TRH -   UTI Hyponatremia  HTN CAD  I reviewed hospitalist notes, last 24 h vitals and pain scores, last 48 h intake and output, last 24 h labs and trends, and last 24 h imaging results.  Emelia Loron 04/23/2023

## 2023-04-23 NOTE — Progress Notes (Signed)
57 year old admit 7/19 weakness N/V/D has a episode of chest pain when ambulating to bathroom from bed the RN did EKG sinus tachycardia last set vitals 98.2 oral temp 134/97 HR 101 96% 2 liters his cardiac monitoring was discontinued would you like to resume it. He is feeling better now no chest pain

## 2023-04-23 NOTE — Sepsis Progress Note (Signed)
Notified provider of lactic acid level, and of need to order repeat lactic acid at 2053 .

## 2023-04-24 ENCOUNTER — Inpatient Hospital Stay (HOSPITAL_COMMUNITY): Payer: BLUE CROSS/BLUE SHIELD

## 2023-04-24 DIAGNOSIS — E871 Hypo-osmolality and hyponatremia: Secondary | ICD-10-CM | POA: Diagnosis not present

## 2023-04-24 DIAGNOSIS — R609 Edema, unspecified: Secondary | ICD-10-CM

## 2023-04-24 LAB — LACTIC ACID, PLASMA
Lactic Acid, Venous: 3.5 mmol/L (ref 0.5–1.9)
Lactic Acid, Venous: 4.1 mmol/L (ref 0.5–1.9)

## 2023-04-24 LAB — GLUCOSE, CAPILLARY
Glucose-Capillary: 163 mg/dL — ABNORMAL HIGH (ref 70–99)
Glucose-Capillary: 170 mg/dL — ABNORMAL HIGH (ref 70–99)
Glucose-Capillary: 173 mg/dL — ABNORMAL HIGH (ref 70–99)
Glucose-Capillary: 191 mg/dL — ABNORMAL HIGH (ref 70–99)

## 2023-04-24 LAB — CBC
HCT: 25.9 % — ABNORMAL LOW (ref 39.0–52.0)
Hemoglobin: 9.2 g/dL — ABNORMAL LOW (ref 13.0–17.0)
MCH: 30.8 pg (ref 26.0–34.0)
MCHC: 35.5 g/dL (ref 30.0–36.0)
MCV: 86.6 fL (ref 80.0–100.0)
Platelets: 104 10*3/uL — ABNORMAL LOW (ref 150–400)
RBC: 2.99 MIL/uL — ABNORMAL LOW (ref 4.22–5.81)
RDW: 16.5 % — ABNORMAL HIGH (ref 11.5–15.5)
WBC: 32 10*3/uL — ABNORMAL HIGH (ref 4.0–10.5)
nRBC: 0.2 % (ref 0.0–0.2)

## 2023-04-24 LAB — COMPREHENSIVE METABOLIC PANEL: Albumin: <1.5 g/dL — ABNORMAL LOW (ref 3.5–5.0)

## 2023-04-24 LAB — PROCALCITONIN: Procalcitonin: 1.73 ng/mL

## 2023-04-24 LAB — BRAIN NATRIURETIC PEPTIDE: B Natriuretic Peptide: 571.3 pg/mL — ABNORMAL HIGH (ref 0.0–100.0)

## 2023-04-24 MED ORDER — FUROSEMIDE 10 MG/ML IJ SOLN: 40.0000 mg | Freq: Once | INTRAMUSCULAR | Status: DC

## 2023-04-24 MED ORDER — DIAZEPAM 5 MG/ML IJ SOLN
5.0000 mg | Freq: Once | INTRAMUSCULAR | Status: AC
  Administered 2023-04-24: 5 mg via INTRAVENOUS
  Filled 2023-04-24: qty 2

## 2023-04-24 MED ORDER — ALBUMIN HUMAN 25 % IV SOLN
25.0000 g | Freq: Four times a day (QID) | INTRAVENOUS | Status: AC
  Administered 2023-04-24 (×4): 25 g via INTRAVENOUS
  Filled 2023-04-24 (×4): qty 100

## 2023-04-24 MED ORDER — ENOXAPARIN SODIUM 40 MG/0.4ML IJ SOSY
40.0000 mg | PREFILLED_SYRINGE | Freq: Every day | INTRAMUSCULAR | Status: DC
  Administered 2023-04-24: 40 mg via SUBCUTANEOUS

## 2023-04-24 MED ORDER — SODIUM CHLORIDE 0.9 % IV SOLN: INTRAVENOUS | Status: DC

## 2023-04-24 MED ORDER — SODIUM CHLORIDE 0.9 % IV SOLN
100.0000 mg | Freq: Every day | INTRAVENOUS | Status: DC
  Administered 2023-04-24 – 2023-04-25 (×2): 100 mg via INTRAVENOUS
  Filled 2023-04-24 (×3): qty 5

## 2023-04-24 MED ORDER — TRAVASOL 10 % IV SOLN
INTRAVENOUS | Status: AC
  Filled 2023-04-24: qty 960

## 2023-04-24 MED ORDER — VANCOMYCIN HCL IN DEXTROSE 1-5 GM/200ML-% IV SOLN
1000.0000 mg | Freq: Two times a day (BID) | INTRAVENOUS | Status: DC
  Administered 2023-04-24 – 2023-04-25 (×5): 1000 mg via INTRAVENOUS
  Filled 2023-04-24 (×5): qty 200

## 2023-04-24 MED ORDER — FUROSEMIDE 10 MG/ML IJ SOLN
40.0000 mg | Freq: Four times a day (QID) | INTRAMUSCULAR | Status: AC
  Administered 2023-04-24 (×3): 40 mg via INTRAVENOUS
  Filled 2023-04-24 (×3): qty 4

## 2023-04-24 NOTE — Progress Notes (Signed)
PROGRESS NOTE    Brent Rogers  AOZ:[id number] DOB: [birth year] DOA: 04/17/2023 PCP: Marcine Matar, MD   Brief Narrative: No notes on file   Assessment and Plan:  57 year old male with history of hypertension, GERD, PUD, CAD, depression, anxiety, chronic pain, anemia, s/p partial pancreatectomy, s/p cholecystectomy presents with nausea, vomiting, abdominal pain for the last several days also associated with the diarrhea, pain in the left lower quadrant.  The pain is worse with urinating, having bowel movement.  Patient had significant weight loss, could not quantify..  Workup in the emergency department patient with low sodium of 124, elevated LFTs with a total bilirubin of 2, ALT 47, AST 63, ALP 258, elevated white blood cell count of 12.3, thrombocytopenia 89.  Urinalysis positive for urinary tract infection.  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  General surgery is consulted, recommended to continue with a clear liquid diet, anticipates surgery.  Patient is placed on Rocephin, Flagyl  Gastrocolonic fistula -Continue with clear liquid diet per general surgery recommendations -Appreciate general surgery follow-up and recommendations -Anticipated surgery -Discontinued Rocephin, Flagyl, started the patient on Zosyn 7/25  Sepsis secondary to intra-abdominal infection -Patient is spiking low-grade fever 99.7, elevated white blood cell count, tachycardia -Patient received IV fluids on sepsis protocol -Changed IV antibiotics to Zosyn, vancomycin, micafungin  Intra-abdominal infection -Keep the patient strict n.p.o. -Ordered CT abdomen and pelvis with contrast to rule out any intra-abdominal abscess- -Continue Zosyn -Continue with the TPN -CT abdomen and pelvis from last night showed only small ascites.  Urinary tract infection -Urinalysis is consistent with  UTI -Continue with the current antibiotic regimen.  Pneumonia multifocal -Continue the current antibiotic regimen.  Right pleural effusion -Chest x-ray, CT abdomen and pelvis consistent with bilateral increasing infiltrates, pleural effusion -S/p thoracentesis, removed 800 mL of straw appearing fluid from the right pleural space  Hyponatremia -Hypovolemic hyponatremia -Continue with IV fluids -Sodium trending up 123-129-132  Hypoglycemia -Currently on D5W -Secondary to decreased p.o. intake.  Hypokalemia -Replacing 0 IV fluids.  Hypocalcemia -Corrected calcium is 9.2.  History of pancreatic mass -S/p Whipple procedure  Severe protein calorie malnutrition -If unable to start the patient on diet, start the patient on TPN.  Chronic pain -Continue the current regimen.  Anemia -Get iron profile, B12, folate RBC, reticulocyte count looking into any correctable causes.  Elevated LFTs -Get right upper quadrant ultrasound  Depression/anxiety -Continue with the current regimen.  Insomnia -Add Ambien at nighttime.  CAD -Continue with aspirin, statin.  Thrombocytopenia -Closely follow-up with platelet count -No signs of bleeding.  Hypertension -Not on any medications.  DVT prophylaxis: SCDs Code Status:   Code Status: Full Code Family Communication: None at bedside Disposition Plan: Pending clinical improvement  Consultants:  General surgery  Procedures:  None  Antimicrobials: Ceftriaxone Flagyl    Subjective: Rapid response was called due to worsening of the shortness of breath.  Added vancomycin, micafungin.  WBC count trended up.  The patient was placed on BiPAP due to hypoxia. Objective: BP (!) 136/92   Pulse (!) 108   Temp (!) 97.5 F (36.4 C) (Axillary)   Resp (!) 21   Ht 6\' 3"  (1.905 m)   Wt 64.5 kg   SpO2 97%   BMI 17.77 kg/m   Examination:  General exam: Appears calm and comfortable.  Thin appearing. Respiratory system: Clear to  auscultation. Respiratory effort normal. Cardiovascular system:  S1 & S2 heard, RRR. No murmurs, rubs, gallops or clicks. Gastrointestinal system: Abdomen is distended, mild fluid shift Central nervous system: Alert and oriented. Musculoskeletal: No edema. No calf tenderness Psychiatry: Judgement and insight appear normal. Mood & affect appropriate.   Data Reviewed: I have personally reviewed following labs and imaging studies  CBC   Last metabolic panel   GFR: Estimated Creatinine Clearance: 92.9 mL/min (by C-G formula based on SCr of 0.7 mg/dL).  Recent Results (from the past 240 hour(s))  Urine Culture (for pregnant, neutropenic or urologic patients or patients with an indwelling urinary catheter)     Status: Abnormal   Collection Time: 04/18/23 12:07 PM   Specimen: Urine, Clean Catch  Result Value Ref Range Status   Specimen Description URINE, CLEAN CATCH  Final   Special Requests   Final    NONE Performed at [facility], [address] [place], [place] [address]    Culture >=100,000 COLONIES/mL KLEBSIELLA PNEUMONIAE (A)  Final   Report Status 04/20/2023 FINAL  Final   Organism ID, Bacteria KLEBSIELLA PNEUMONIAE (A)  Final      Susceptibility   Klebsiella pneumoniae - MIC*    AMPICILLIN >=32 RESISTANT Resistant     CEFAZOLIN <=4 SENSITIVE Sensitive     CEFEPIME <=0.12 SENSITIVE Sensitive     CEFTRIAXONE <=0.25 SENSITIVE Sensitive     CIPROFLOXACIN <=0.25 SENSITIVE Sensitive     GENTAMICIN <=1 SENSITIVE Sensitive     IMIPENEM <=0.25 SENSITIVE Sensitive     NITROFURANTOIN 64 INTERMEDIATE Intermediate     TRIMETH/SULFA <=20 SENSITIVE Sensitive     AMPICILLIN/SULBACTAM >=32 RESISTANT Resistant     PIP/TAZO 8 SENSITIVE Sensitive     * >=100,000 COLONIES/mL KLEBSIELLA PNEUMONIAE  Culture, blood (x 2)     Status: None (Preliminary result)   Collection Time: 04/23/23  7:22 PM   Specimen: BLOOD LEFT FOREARM  Result Value Ref Range Status   Specimen Description BLOOD  LEFT FOREARM  Final   Special Requests   Final    BOTTLES DRAWN AEROBIC AND ANAEROBIC Blood Culture results may not be optimal due to an inadequate volume of blood received in culture bottles   Culture   Final    NO GROWTH < 12 HOURS Performed at [facility], [address] [place], [place] [address]    Report Status PENDING  Incomplete      Radiology Studies: DG Chest Port 1 View  Result Date: 04/24/2023 CLINICAL DATA:  Status post thoracentesis EXAM: PORTABLE CHEST 1 VIEW COMPARISON:  04/23/2019 FINDINGS: Cardiac shadow is within normal limits. Right PICC is again seen and stable. Patchy airspace opacities are again identified slightly worse on the left when compared with the prior exam. No pneumothorax is noted following thoracentesis. No sizable effusion is seen. IMPRESSION: No pneumothorax following right-sided thoracentesis. Electronically Signed   By: Alcide Clever M.D.   On: 04/24/2023 01:45   CT ABDOMEN PELVIS W CONTRAST  Result Date: 04/23/2023 CLINICAL DATA:  Sepsis abdominal pain. History of Whipple procedure. EXAM: CT ABDOMEN AND PELVIS WITH CONTRAST TECHNIQUE: Multidetector CT imaging of the abdomen and pelvis was performed using the standard protocol following bolus administration of intravenous contrast. RADIATION DOSE REDUCTION: This exam was performed according to the departmental dose-optimization program which includes automated exposure control, adjustment of the mA and/or kV according to patient size and/or use of iterative reconstruction technique. CONTRAST:  OMNIPAQUE IOHEXOL 300 MG/ML  SOLN COMPARISON:  CT abdomen pelvis dated 04/17/2023. FINDINGS:  Lower chest: Partially visualized moderate bilateral pleural effusions with partial compressive atelectasis of the lower lobes, new since the prior CT. Diffuse patchy airspace density throughout the visualized lungs new since the prior CT. Findings may represent edema but concerning for pneumonia. Clinical  correlation is recommended. There is coronary vascular calcification. No intra-abdominal free air.  Small ascites. Hepatobiliary: Severe fatty liver. Mild pneumobilia. Cholecystectomy. Pancreas: Postsurgical changes of Whipple involving the head of the pancreas. The body and tail of the pancreas appear unremarkable. Spleen: The spleen is unremarkable. Adrenals/Urinary Tract: The adrenal glands are unremarkable. Small bilateral renal cysts. There is no hydronephrosis on either side. There is symmetric enhancement and excretion of contrast by both kidneys. The urinary bladder is grossly unremarkable. Stomach/Bowel: Postsurgical changes of the bowel related to Whipple surgery. There is diffuse thickened appearance of the colon and thickened appearance of the terminal ileum. Findings may be related to ascites or represent enterocolitis. Clinical correlation is recommended. There is no bowel obstruction. The appendix is normal. Vascular/Lymphatic: Moderate aortoiliac atherosclerotic disease. The IVC is unremarkable. No portal venous gas. There is no adenopathy. Reproductive: Ascites, diffuse mesenteric and subcutaneous edema and anasarca. Other: None Musculoskeletal: Osteopenia with degenerative changes of the spine. No acute osseous pathology. IMPRESSION: 1. Partially visualized moderate bilateral pleural effusions with partial compressive atelectasis of the lower lobes, new since the prior CT. 2. Diffuse patchy airspace density throughout the visualized lungs new since the prior CT. Findings may represent edema but concerning for pneumonia. 3. Severe fatty liver. 4. Postsurgical changes of Whipple surgery. 5. Diffuse thickened appearance of the colon and terminal ileum may be related to ascites or represent enterocolitis. No bowel obstruction. Normal appendix. 6. Severe anasarca, new since the prior CT. 7.  Aortic Atherosclerosis (ICD10-I70.0). Electronically Signed   By: Elgie Collard M.D.   On: 04/23/2023 22:39    DG CHEST PORT 1 VIEW  Result Date: 04/23/2023 CLINICAL DATA:  Shortness of breath EXAM: PORTABLE CHEST 1 VIEW COMPARISON:  03/04/2023 FINDINGS: Cardiac shadow is stable. Right PICC is seen in satisfactory position. Diffuse bilateral airspace opacities are noted with both a alveolar and interstitial component. These changes may represent significant underlying edema although diffuse bilateral pneumonia deserves consideration as well. Small effusions are noted bilaterally. IMPRESSION: Diffuse bilateral airspace opacities likely representing edema/inflammation. Electronically Signed   By: Alcide Clever M.D.   On: 04/23/2023 18:24      LOS: 6 days    Siyona Coto,MD [facility] 04/24/2023, 2:35 PM   If 7PM-7AM, please contact night-coverage [email or web address]

## 2023-04-24 NOTE — Progress Notes (Deleted)
Acute partial DVT seen in Right CFV

## 2023-04-24 NOTE — Procedures (Signed)
Thoracentesis  Procedure Note  RAYMONE RENZI  [id number]  1966/09/24  Date:04/24/23  Time:1:17 AM   Provider Performing:Osiah Haring C Katrinka Blazing   Procedure: Thoracentesis with imaging guidance (01093)  Indication(s) Pleural Effusion  Consent Risks of the procedure as well as the alternatives and risks of each were explained to the patient and/or caregiver.  Consent for the procedure was obtained and is signed in the bedside chart  Anesthesia Topical only with 1% lidocaine    Time Out Verified patient identification, verified procedure, site/side was marked, verified correct patient position, special equipment/implants available, medications/allergies/relevant history reviewed, required imaging and test results available.   Sterile Technique Maximal sterile technique including full sterile barrier drape, hand hygiene, sterile gown, sterile gloves, mask, hair covering, sterile ultrasound probe cover (if used).  Procedure Description Ultrasound was used to identify appropriate pleural anatomy for placement and overlying skin marked.  Area of drainage cleaned and draped in sterile fashion. Lidocaine was used to anesthetize the skin and subcutaneous tissue.  800 cc's of straw appearing fluid was drained from the right pleural space. Catheter then removed and bandaid applied to site.   Complications/Tolerance None; patient tolerated the procedure well. Chest X-ray is ordered to confirm no post-procedural complication.   EBL Minimal   Specimen(s) None

## 2023-04-24 NOTE — Significant Event (Signed)
Rapid Response Event Note   Reason for Call :  While rounding on pt, noted to have Increased work of breathing, Tachypnea  Initial Focused Assessment:  Pt laying in bed with eyes closed. Responds to voice, oriented x4.  Skin cool and dry. Lungs with crackles noted bilaterally. Pt denies pain and states breathing feels better with Bipap mask on.   Vitals: HR 96, BP 126/102, RR 44, spO2 94% on Bipap 60%FiO2   CCM notified and to bedside.   Interventions:  CXR Lasix BNP, Procal Albumin Abx- see MAR  Thoracentesis to Right- removed of clear yellow fluid. Consent signed and in chart. Timeout performed. VSS, pt tolerated procedure well.   Plan of Care:  Continue to monitor respiratory status. RN instructed to call with any changes or concerns.   Event Summary:   MD Notified: Md Crosley @0010  End Time: 1308  Mordecai Rasmussen, RN

## 2023-04-24 NOTE — Progress Notes (Signed)
RT took patient off bipap for min. 2 hrs per CCM order. RT will continue to monitor.

## 2023-04-24 NOTE — Progress Notes (Signed)
PT Cancellation Note  Patient Details Name: Brent Rogers MRN: [id number] DOB: Apr 02, 1966   Cancelled Treatment:    Reason Eval/Treat Not Completed: Medical issues which prohibited therapy.  Respiratory status is tenuous.  RN stated pt unable to tolerate today. 04/24/2023  Jacinto Halim., PT Acute Rehabilitation Services [phone]  (office)   [name] 04/24/2023, 5:07 PM

## 2023-04-24 NOTE — Progress Notes (Signed)
OT Cancellation Note  Patient Details Name: Brent Rogers MRN: [id number] DOB: 1966-06-16   Cancelled Treatment:    Reason Eval/Treat Not Completed: Medical issues which prohibited therapy (Pt with decreased O2 sat this morning requiring pt to be placed back on BiPAP. Per discussion with RN, OT holding eval at this time. OT to re-attempt skilled OT eval later this day as appropriate/available.)  Rosanne Sack "Orson Eva., OTR/L, MA Acute Rehab [phone]   Lendon Colonel 04/24/2023, 12:47 PM

## 2023-04-24 NOTE — Progress Notes (Signed)
Lower extremity venous duplex completed. Please see CV Procedures for preliminary results.  Shona Simpson, RVT 04/24/23 2:42 PM

## 2023-04-24 NOTE — Progress Notes (Signed)
RT took patient off bipap and placed patient on HHFNC 75% 40L per CCM order.

## 2023-04-24 NOTE — Progress Notes (Signed)
Patient resting in bed - tachypnec with rate in the 40s, sats dropping in the 80s despite being on 12L HFNC. Placed patient back on Bipap with previous settings.

## 2023-04-24 NOTE — Progress Notes (Signed)
PROGRESS NOTE    Brent Rogers  UXL:[id number] DOB: [birth year] DOA: 04/17/2023 PCP: Marcine Matar, MD   Brief Narrative: No notes on file   Assessment and Plan:  57 year old male with history of hypertension, GERD, PUD, CAD, depression, anxiety, chronic pain, anemia, s/p partial pancreatectomy, s/p cholecystectomy presents with nausea, vomiting, abdominal pain for the last several days also associated with the diarrhea, pain in the left lower quadrant.  The pain is worse with urinating, having bowel movement.  Patient had significant weight loss, could not quantify..  Workup in the emergency department patient with low sodium of 124, elevated LFTs with a total bilirubin of 2, ALT 47, AST 63, ALP 258, elevated white blood cell count of 12.3, thrombocytopenia 89.  Urinalysis positive for urinary tract infection.  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  General surgery is consulted, recommended to continue with a clear liquid diet, anticipates surgery.  Patient is placed on Rocephin, Flagyl  Gastrocolonic fistula -Continue with clear liquid diet per general surgery recommendations -Appreciate general surgery follow-up and recommendations -Anticipated surgery -Discontinued Rocephin, Flagyl, started the patient on Zosyn 7/25  Sepsis secondary to intra-abdominal infection -Patient is spiking low-grade fever 99.7, elevated white blood cell count, tachycardia -Patient received IV fluids on sepsis protocol -Changed IV antibiotics to Zosyn.  Intra-abdominal infection -Keep the patient strict n.p.o. -Ordered CT abdomen and pelvis with contrast to rule out any intra-abdominal abscess- -Continue Zosyn  Urinary tract infection -Urinalysis is consistent with UTI -Continue with the current antibiotic regimen.  Hyponatremia -Hypovolemic hyponatremia -Continue with IV  fluids -Sodium trending up 123-129-132  Hypoglycemia -Currently on D5W -Secondary to decreased p.o. intake.  Hypokalemia -Replacing 0 IV fluids.  Hypocalcemia -Corrected calcium is 9.2.  History of pancreatic mass -S/p Whipple procedure  Severe protein calorie malnutrition -If unable to start the patient on diet, start the patient on TPN.  Chronic pain -Continue the current regimen.  Anemia -Get iron profile, B12, folate RBC, reticulocyte count looking into any correctable causes.  Elevated LFTs -Get right upper quadrant ultrasound  Depression/anxiety -Continue with the current regimen.  Insomnia -Add Ambien at nighttime.  CAD -Continue with aspirin, statin.  Thrombocytopenia -Closely follow-up with platelet count -No signs of bleeding.  Hypertension -Not on any medications.  DVT prophylaxis: SCDs Code Status:   Code Status: Full Code Family Communication: None at bedside Disposition Plan: Pending clinical improvement  Consultants:  General surgery  Procedures:  None  Antimicrobials: Ceftriaxone Flagyl    Subjective: Patient has tachycardia, low normal blood pressure.  Was given 1 L of normal saline bolus.  With some improvement of the tachycardia.  However continued to have multiple episodes of tachycardia. Objective: BP (!) 136/92   Pulse (!) 108   Temp (!) 97.5 F (36.4 C) (Axillary)   Resp (!) 21   Ht 6\' 3"  (1.905 m)   Wt 64.5 kg   SpO2 97%   BMI 17.77 kg/m   Examination:  General exam: Appears calm and comfortable.  Thin appearing. Respiratory system: Clear to auscultation. Respiratory effort normal. Cardiovascular system: S1 & S2 heard, RRR. No murmurs, rubs, gallops or clicks. Gastrointestinal system: Abdomen is distended, mild fluid shift Central nervous system: Alert and oriented. Musculoskeletal: No edema. No calf tenderness Psychiatry: Judgement and insight appear normal. Mood & affect appropriate.   Data Reviewed: I have  personally reviewed following labs and imaging studies  CBC   Last metabolic panel   GFR: Estimated Creatinine Clearance: 92.9 mL/min (by C-G formula based on SCr of 0.7 mg/dL).  Recent Results (from the past 240 hour(s))  Urine Culture (for pregnant, neutropenic or urologic patients or patients with an indwelling urinary catheter)     Status: Abnormal   Collection Time: 04/18/23 12:07 PM   Specimen: Urine, Clean Catch  Result Value Ref Range Status   Specimen Description URINE, CLEAN CATCH  Final   Special Requests   Final    NONE Performed at [facility], [address] [place], [place] [address]    Culture >=100,000 COLONIES/mL KLEBSIELLA PNEUMONIAE (A)  Final   Report Status 04/20/2023 FINAL  Final   Organism ID, Bacteria KLEBSIELLA PNEUMONIAE (A)  Final      Susceptibility   Klebsiella pneumoniae - MIC*    AMPICILLIN >=32 RESISTANT Resistant     CEFAZOLIN <=4 SENSITIVE Sensitive     CEFEPIME <=0.12 SENSITIVE Sensitive     CEFTRIAXONE <=0.25 SENSITIVE Sensitive     CIPROFLOXACIN <=0.25 SENSITIVE Sensitive     GENTAMICIN <=1 SENSITIVE Sensitive     IMIPENEM <=0.25 SENSITIVE Sensitive     NITROFURANTOIN 64 INTERMEDIATE Intermediate     TRIMETH/SULFA <=20 SENSITIVE Sensitive     AMPICILLIN/SULBACTAM >=32 RESISTANT Resistant     PIP/TAZO 8 SENSITIVE Sensitive     * >=100,000 COLONIES/mL KLEBSIELLA PNEUMONIAE  Culture, blood (x 2)     Status: None (Preliminary result)   Collection Time: 04/23/23  7:22 PM   Specimen: BLOOD LEFT FOREARM  Result Value Ref Range Status   Specimen Description BLOOD LEFT FOREARM  Final   Special Requests   Final    BOTTLES DRAWN AEROBIC AND ANAEROBIC Blood Culture results may not be optimal due to an inadequate volume of blood received in culture bottles   Culture   Final    NO GROWTH < 12 HOURS Performed at [facility], [address] [place], [place] [address]    Report Status PENDING  Incomplete      Radiology  Studies: DG Chest Port 1 View  Result Date: 04/24/2023 CLINICAL DATA:  Status post thoracentesis EXAM: PORTABLE CHEST 1 VIEW COMPARISON:  04/23/2019 FINDINGS: Cardiac shadow is within normal limits. Right PICC is again seen and stable. Patchy airspace opacities are again identified slightly worse on the left when compared with the prior exam. No pneumothorax is noted following thoracentesis. No sizable effusion is seen. IMPRESSION: No pneumothorax following right-sided thoracentesis. Electronically Signed   By: Alcide Clever M.D.   On: 04/24/2023 01:45   CT ABDOMEN PELVIS W CONTRAST  Result Date: 04/23/2023 CLINICAL DATA:  Sepsis abdominal pain. History of Whipple procedure. EXAM: CT ABDOMEN AND PELVIS WITH CONTRAST TECHNIQUE: Multidetector CT imaging of the abdomen and pelvis was performed using the standard protocol following bolus administration of intravenous contrast. RADIATION DOSE REDUCTION: This exam was performed according to the departmental dose-optimization program which includes automated exposure control, adjustment of the mA and/or kV according to patient size and/or use of iterative reconstruction technique. CONTRAST:  OMNIPAQUE IOHEXOL 300 MG/ML  SOLN COMPARISON:  CT abdomen pelvis dated 04/17/2023. FINDINGS: Lower chest: Partially visualized moderate bilateral pleural effusions with partial compressive atelectasis of the lower lobes, new since the prior CT. Diffuse patchy airspace density throughout the visualized lungs new since the prior CT. Findings may represent edema but concerning for pneumonia. Clinical correlation is recommended. There is coronary vascular calcification. No intra-abdominal free air.  Small ascites. Hepatobiliary: Severe fatty liver. Mild pneumobilia. Cholecystectomy. Pancreas: Postsurgical changes of Whipple involving the head of the pancreas. The body and tail of the pancreas appear unremarkable. Spleen: The spleen is unremarkable. Adrenals/Urinary Tract: The  adrenal glands are unremarkable. Small bilateral renal cysts. There is no hydronephrosis on either side. There is symmetric enhancement and excretion of contrast by both kidneys. The urinary bladder is grossly unremarkable. Stomach/Bowel: Postsurgical changes of the bowel related to Whipple surgery. There is diffuse thickened appearance of the colon and thickened appearance of the terminal ileum. Findings may be related to ascites or represent enterocolitis. Clinical correlation is recommended. There is no bowel obstruction. The appendix is normal. Vascular/Lymphatic: Moderate aortoiliac atherosclerotic disease. The IVC is unremarkable. No portal venous gas. There is no adenopathy. Reproductive: Ascites, diffuse mesenteric and subcutaneous edema and anasarca. Other: None Musculoskeletal: Osteopenia with degenerative changes of the spine. No acute osseous pathology. IMPRESSION: 1. Partially visualized moderate bilateral pleural effusions with partial compressive atelectasis of the lower lobes, new since the prior CT. 2. Diffuse patchy airspace density throughout the visualized lungs new since the prior CT. Findings may represent edema but concerning for pneumonia. 3. Severe fatty liver. 4. Postsurgical changes of Whipple surgery. 5. Diffuse thickened appearance of the colon and terminal ileum may be related to ascites or represent enterocolitis. No bowel obstruction. Normal appendix. 6. Severe anasarca, new since the prior CT. 7.  Aortic Atherosclerosis (ICD10-I70.0). Electronically Signed   By: Elgie Collard M.D.   On: 04/23/2023 22:39   DG CHEST PORT 1 VIEW  Result Date: 04/23/2023 CLINICAL DATA:  Shortness of breath EXAM: PORTABLE CHEST 1 VIEW COMPARISON:  03/04/2023 FINDINGS: Cardiac shadow is stable. Right PICC is seen in satisfactory position. Diffuse bilateral airspace opacities are noted with both a alveolar and interstitial component. These changes may represent significant underlying edema although  diffuse bilateral pneumonia deserves consideration as well. Small effusions are noted bilaterally. IMPRESSION: Diffuse bilateral airspace opacities likely representing edema/inflammation. Electronically Signed   By: Alcide Clever M.D.   On: 04/23/2023 18:24      LOS: 6 days    Quenisha Lovins,MD [facility] 04/24/2023, 2:31 PM   If 7PM-7AM, please contact night-coverage [email or web address]

## 2023-04-24 NOTE — Consult Note (Signed)
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  Attempting conservative management and TPN to try to help nutritional status.  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  Denies any frank aspiration, breathing worsened after a BM.  CXR with worsening bilateral infiltrates. After discussion with rapid response and crosscover TRH, PCCM consulted to assist with management.  Patient states breathing is stable from this am, has not been able to sleep due to anxiety over SOB.  2022 echo normal  Pertinent  Medical History  Chronic pancreatitis Prior Whipple Severe protein calorie malnutrition Question of alcohol abuse but multiple previous notes state his use is modest at best.  I do see a level of 134 back in 2021. HTN  Significant Hospital Events: Including procedures, antibiotic start and stop dates in addition to other pertinent events   7/19 admit 04/24/2023 thoracentesis with 800 cc of fluid obtained  Interim History / Subjective:  See HPI  Objective   Blood pressure 120/84, pulse (!) 106, temperature 98.6 F (37 C), temperature source Axillary, resp. rate (!) 37, height 6\' 3"  (1.905 m), weight 64.5 kg, SpO2 96%.    FiO2 (%):  [60 %] 60 %   Intake/Output Summary (Last 24 hours) at 04/24/2023 1112 Last data filed at 04/24/2023 0900 Gross per 24 hour  Intake 2159.5 ml  Output 2550 ml  Net -390.5 ml   Filed Weights   04/22/23 0425 04/22/23 0834 04/23/23 0646  Weight: 62.2 kg 62.5 kg 64.5 kg    Examination: Chronically ill-appearing male on BiPAP. No JVD is appreciated Currently on TPN Heart rate is sinus  tach regular rate and rhythm Abdomen soft positive bowel sounds Extremities with muscle wasting Neurologically appears intact He has a pure wick with yellow urine. Chest x-ray is improved after thoracentesis Resolved Hospital Problem list   N/A  Assessment & Plan:  Rather sudden onset acute hypoxemic respiratory failure most c/w aspiration event complicated by poor nutritional status, third spacing, and volume overload aka ARDS +/- PNA  Severe leukamoid reaction- CT A/P reassuring against abdominal source, suspect related to above  Cologastric fistula, hx of Whipple  Chronic severe protein calorie malnutrition  Chronic pancreatic insufficiency  Question of ETOH abuse hx  Chronic anemia  Plan: Status post thoracentesis 03/25/2023 at 1 AM with 800 cc of fluid removed Taken off BiPAP this a.m. at 0 802 trial for 2 hours to see how he does may be able to leave it off. Continue BiPAP as needed Continue antimicrobial therapy Procalcitonin ordered Continue TPN Currently he is a full code but may give consideration to palliation in near future    Best Practice (right click and "Reselect all SmartList Selections" daily)   Diet/type: NPO DVT prophylaxis: not on any? Start lovenox GI prophylaxis: PPI Lines: Central line Foley:  N/A Code Status:  full code Last date of multidisciplinary goals of care discussion [done at time of eval]  Labs   CBC: Recent Labs  Lab 04/19/23 0022 04/20/23 0228 04/21/23 0113 04/22/23 0355 04/23/23 1240 04/23/23 1507 04/23/23 2353  WBC 10.7*  11.1*   < > 9.5 14.2* 33.5* 36.3* 44.9*  NEUTROABS 7.3  --   --   --   --   --  40.4*  HGB 8.3*  8.6*   < > 8.5* 8.7* 9.1* 9.5* 10.1*  HCT 23.3*  24.1*   < > 23.9* 24.5* 25.1* 26.4* 28.5*  MCV 82.9  82.3   < > 84.8 84.2 84.5 86.0 84.3  PLT 70*  71*   < > 100* 156 148* 154 133*   < > = values in this interval not displayed.    Basic Metabolic Panel: Recent Labs  Lab 04/22/23 0355  04/22/23 1450 04/23/23 0505 04/23/23 1507 04/23/23 2353 04/24/23 0858  NA 132* 130* 128* 128* 128* 130*  K 3.2* 3.2* 4.1 6.0* 5.1 4.7  CL 102 104 100 101 100 99  CO2 22 18* 20* 19* 20* 21*  GLUCOSE 140* 149* 109* 309* 169* 185*  BUN <5* <5* <5* <5* <5* <5*  CREATININE 0.54* 0.52* 0.50* 0.50* 0.56* 0.70  CALCIUM 7.4* 6.4* 7.2* 6.7* 7.0* 7.4*  MG 1.4* 1.8 1.8 2.6* 2.0 1.9  PHOS <1.0* 1.6* 1.6*  --   --  2.8   GFR: Estimated Creatinine Clearance: 92.9 mL/min (by C-G formula based on SCr of 0.7 mg/dL). Recent Labs  Lab 04/22/23 0355 04/23/23 1240 04/23/23 1507 04/23/23 1922 04/23/23 2353 04/24/23 0352 04/24/23 0858  WBC 14.2* 33.5* 36.3*  --  44.9*  --   --   LATICACIDVEN  --   --   --  3.9*  --  3.5* 4.1*    Liver Function Tests: Recent Labs  Lab 04/18/23 0617 04/22/23 0355 04/23/23 0505 04/23/23 2353  AST 39 26 26 32  ALT 32 22 21 15   ALKPHOS 172* 139* 142* 121  BILITOT 1.4* 1.5* 1.3* 0.9  PROT 4.6* 4.3* 4.5* 4.2*  ALBUMIN 1.7* 1.6* 1.6* <1.5*   No results for input(s): "LIPASE", "AMYLASE" in the last 168 hours.  No results for input(s): "AMMONIA" in the last 168 hours.  ABG    Component Value Date/Time   PHART 7.37 04/23/2023 1700   PCO2ART 31 (L) 04/23/2023 1700   PO2ART 71 (L) 04/23/2023 1700   HCO3 17.9 (L) 04/23/2023 1700   TCO2 23 04/17/2023 1225   ACIDBASEDEF 6.2 (H) 04/23/2023 1700   O2SAT 94.8 04/23/2023 1700     Coagulation Profile: Recent Labs  Lab 04/23/23 1922  INR 1.6*    Cardiac Enzymes: No results for input(s): "CKTOTAL", "CKMB", "CKMBINDEX", "TROPONINI" in the last 168 hours.  HbA1C: Hgb A1c MFr Bld  Date/Time Value Ref Range Status  09/04/2021 06:45 AM 5.3 4.8 - 5.6 % Final    Comment:    (NOTE) Pre diabetes:          5.7%-6.4%  Diabetes:              >6.4%  Glycemic control for   <7.0% adults with diabetes   03/06/2021 08:18 PM 5.2 4.8 - 5.6 % Final    Comment:    (NOTE)         Prediabetes: 5.7 - 6.4          Diabetes: >6.4         Glycemic control for adults with diabetes: <7.0     CBG: Recent Labs  Lab 04/23/23 0619 04/23/23 1128 04/23/23 1739 04/24/23 0024 04/24/23 0609  GLUCAP 152* 84 119* 163* 191*     Critical care time: 30 mins    Steve Sharlena Kristensen ACNP Acute Care Nurse Practitioner [facility]/Critical Care Please consult Amion 04/24/2023, 11:13 AM

## 2023-04-24 NOTE — Progress Notes (Signed)
RT received call from RN stating that she placed patient back on bipap due to patient taking off HHFNC saying he can't tolerate it. RT making MD aware and will continue to monitor as needed.

## 2023-04-24 NOTE — Progress Notes (Signed)
Nutrition Brief Note  Visited patient at bedside who was wearing his BiPAP he was not able to talk much   He is tolerating TPN @50  ml/hr at this time (goal rate 80 ml/hr)   Rapid Response called this morin ng due to patient being SOB. He is s/p thoracentesis, 800 ml removed.   Labs reviewed, hyponatremia (NS infusing) and hyperglycemia (insulin regimen) noted Meds Reviewed   Leodis Rains, RDN, LDN  Clinical Nutrition

## 2023-04-24 NOTE — Progress Notes (Signed)
Pt placed back on Bi-PAP as he was unable to tolerate the Heated HFNC. He stated the heat felt like it was suffocating him. Bi-PAP replaced at previous settings. RT made aware, who stated they would make CCM aware as well.

## 2023-04-24 NOTE — Progress Notes (Signed)
Pharmacy Antibiotic Note  Brent Rogers is a 57 y.o. male admitted with gastrocolonic fistula on TPN, now with pneumonia.  Pharmacy has been consulted for Vancomycin  dosing.  Plan: Vancomycin 1000 mg IV q12h   Height: 6\' 3"  (190.5 cm) Weight: 64.5 kg (142 lb 3.2 oz) IBW/kg (Calculated) : 84.5  Temp (24hrs), Avg:98.3 F (36.8 C), Min:97.6 F (36.4 C), Max:99.8 F (37.7 C)  Recent Labs  Lab 04/21/23 0113 04/22/23 0355 04/22/23 1450 04/23/23 0505 04/23/23 1240 04/23/23 1507 04/23/23 1922 04/23/23 2353  WBC 9.5 14.2*  --   --  33.5* 36.3*  --  44.9*  CREATININE 0.50* 0.54* 0.52* 0.50*  --  0.50*  --  0.56*  LATICACIDVEN  --   --   --   --   --   --  3.9*  --     Estimated Creatinine Clearance: 92.9 mL/min (A) (by C-G formula based on SCr of 0.56 mg/dL (L)).    No Known Allergies    Eddie Candle 04/24/2023 1:08 AM

## 2023-04-24 NOTE — Consult Note (Signed)
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  Attempting conservative management and TPN to try to help nutritional status.  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  Denies any frank aspiration, breathing worsened after a BM.  CXR with worsening bilateral infiltrates. After discussion with rapid response and crosscover TRH, PCCM consulted to assist with management.  Patient states breathing is stable from this am, has not been able to sleep due to anxiety over SOB.  2022 echo normal  Pertinent  Medical History  Chronic pancreatitis Prior Whipple Severe protein calorie malnutrition Question of alcohol abuse but multiple previous notes state his use is modest at best.  I do see a level of 134 back in 2021. HTN  Significant Hospital Events: Including procedures, antibiotic start and stop dates in addition to other pertinent events   7/19 admit  Interim History / Subjective:  See HPI  Objective   Blood pressure (!) 132/102, pulse (!) 104, temperature 97.6 F (36.4 C), temperature source Axillary, resp. rate (!) 34, height 6\' 3"  (1.905 m), weight 64.5 kg, SpO2 93%.    FiO2 (%):  [60 %] 60 %   Intake/Output Summary (Last 24 hours) at 04/24/2023 0057 Last data filed at 04/24/2023 0048 Gross per 24 hour  Intake 1641.15 ml  Output 1550 ml  Net 91.15 ml   Filed Weights   04/22/23 0425 04/22/23 0834 04/23/23 0646  Weight: 62.2 kg 62.5 kg 64.5 kg    Examination: General: chronically ill appearing man tachypneic on BIPAP HENT: BIPAP in place with good seal Lungs: Diminished bases, moderate right, trace left  effusion simple appearing Cardiovascular: Tachy, ext warm Abdomen: soft, +BS Extremities: +muscle wasting Neuro: moves all 4 ext to command GU: purewick yellow urine  Profound developing leukamoid reaction ARDS vs. Edema on CXR CT A/P personally reviewed: lung changes as per CXR and has near complete replacement of liver with fatty tissue Coag derangement noted  Resolved Hospital Problem list   N/A  Assessment & Plan:  Rather sudden onset acute hypoxemic respiratory failure most c/w aspiration event complicated by poor nutritional status, third spacing, and volume overload aka ARDS +/- PNA  Severe leukamoid reaction- CT A/P reassuring against abdominal source, suspect related to above  Cologastric fistula, hx of Whipple  Chronic severe protein calorie malnutrition  Chronic pancreatic insufficiency  Question of ETOH abuse hx  Chronic anemia  - Albumin/lasix - BIPAP for now, very low threshold for ICU transfer - Valium x 1 to help with sleep - Abx from rocephin/flagyl to vanc/zosyn/micafungin - Check Pct - TPN, at some point if survives would need surgery - He is full code, I think we may need palliative input if does not turn around quick with above interventions  Best Practice (right click and "Reselect all SmartList Selections" daily)   Diet/type: NPO DVT prophylaxis: not on any? Start lovenox GI prophylaxis: PPI Lines: Central line Foley:  N/A Code Status:  full code Last date of multidisciplinary goals of care discussion [done at time of eval]  Labs   CBC: Recent Labs  Lab 04/19/23 0022 04/20/23 0228 04/21/23 0113 04/22/23 0355 04/23/23 1240 04/23/23 1507 04/23/23  2353  WBC 10.7*  11.1*   < > 9.5 14.2* 33.5* 36.3* 44.9*  NEUTROABS 7.3  --   --   --   --   --  40.4*  HGB 8.3*  8.6*   < > 8.5* 8.7* 9.1* 9.5* 10.1*  HCT 23.3*  24.1*   < > 23.9* 24.5* 25.1* 26.4* 28.5*  MCV 82.9  82.3   < > 84.8 84.2 84.5 86.0 84.3  PLT 70*  71*   < > 100* 156 148*  154 133*   < > = values in this interval not displayed.    Basic Metabolic Panel: Recent Labs  Lab 04/21/23 0113 04/22/23 0355 04/22/23 1450 04/23/23 0505 04/23/23 1507  NA 128* 132* 130* 128* 128*  K 3.8 3.2* 3.2* 4.1 6.0*  CL 102 102 104 100 101  CO2 20* 22 18* 20* 19*  GLUCOSE 151* 140* 149* 109* 309*  BUN <5* <5* <5* <5* <5*  CREATININE 0.50* 0.54* 0.52* 0.50* 0.50*  CALCIUM 6.9* 7.4* 6.4* 7.2* 6.7*  MG  --  1.4* 1.8 1.8 2.6*  PHOS  --  <1.0* 1.6* 1.6*  --    GFR: Estimated Creatinine Clearance: 92.9 mL/min (A) (by C-G formula based on SCr of 0.5 mg/dL (L)). Recent Labs  Lab 04/22/23 0355 04/23/23 1240 04/23/23 1507 04/23/23 1922 04/23/23 2353  WBC 14.2* 33.5* 36.3*  --  44.9*  LATICACIDVEN  --   --   --  3.9*  --     Liver Function Tests: Recent Labs  Lab 04/17/23 1110 04/18/23 0617 04/22/23 0355 04/23/23 0505  AST 63* 39 26 26  ALT 47* 32 22 21  ALKPHOS 258* 172* 139* 142*  BILITOT 2.0* 1.4* 1.5* 1.3*  PROT 6.4* 4.6* 4.3* 4.5*  ALBUMIN 2.5* 1.7* 1.6* 1.6*   Recent Labs  Lab 04/17/23 1110  LIPASE 17   No results for input(s): "AMMONIA" in the last 168 hours.  ABG    Component Value Date/Time   PHART 7.37 04/23/2023 1700   PCO2ART 31 (L) 04/23/2023 1700   PO2ART 71 (L) 04/23/2023 1700   HCO3 17.9 (L) 04/23/2023 1700   TCO2 23 04/17/2023 1225   ACIDBASEDEF 6.2 (H) 04/23/2023 1700   O2SAT 94.8 04/23/2023 1700     Coagulation Profile: Recent Labs  Lab 04/23/23 1922  INR 1.6*    Cardiac Enzymes: No results for input(s): "CKTOTAL", "CKMB", "CKMBINDEX", "TROPONINI" in the last 168 hours.  HbA1C: Hgb A1c MFr Bld  Date/Time Value Ref Range Status  09/04/2021 06:45 AM 5.3 4.8 - 5.6 % Final    Comment:    (NOTE) Pre diabetes:          5.7%-6.4%  Diabetes:              >6.4%  Glycemic control for   <7.0% adults with diabetes   03/06/2021 08:18 PM 5.2 4.8 - 5.6 % Final    Comment:    (NOTE)         Prediabetes: 5.7 - 6.4          Diabetes: >6.4         Glycemic control for adults with diabetes: <7.0     CBG: Recent Labs  Lab 04/23/23 0005 04/23/23 0619 04/23/23 1128 04/23/23 1739 04/24/23 0024  GLUCAP 180* 152* 84 119* 163*    Review of Systems:    Positive Symptoms in bold:  Constitutional fevers, chills, weight loss, fatigue, anorexia, malaise  Eyes decreased vision, double vision, eye irritation  Ears, Nose, Mouth, Throat sore throat, trouble swallowing, sinus congestion  Cardiovascular chest pain, paroxysmal nocturnal dyspnea, lower ext edema, palpitations   Respiratory SOB, cough, DOE, hemoptysis, wheezing  Gastrointestinal nausea, vomiting, diarrhea  Genitourinary burning with urination, trouble urinating  Musculoskeletal joint aches, joint swelling, back pain  Integumentary  rashes, skin lesions  Neurological focal weakness, focal numbness, trouble speaking, headaches  Psychiatric depression, anxiety, confusion  Endocrine polyuria, polydipsia, cold intolerance, heat intolerance  Hematologic abnormal bruising, abnormal bleeding, unexplained nose bleeds  Allergic/Immunologic recurrent infections, hives, swollen lymph nodes     Past Medical History:  He,  has a past medical history of Benign tumor of endocrine pancreas, Chronic pancreatitis (HCC), Foot ulcer with fat layer exposed (HCC), Hypertension, Migraine, Pancreatic abnormality, Pneumonia (11/12/2015), and Scoliosis.   Surgical History:   Past Surgical History:  Procedure Laterality Date   BACK SURGERY     BIOPSY  09/26/2021   Procedure: BIOPSY;  Surgeon: Meryl Dare, MD;  Location: [facility] ENDOSCOPY;  Service: Endoscopy;;   COLONOSCOPY WITH PROPOFOL N/A 03/11/2021   Procedure: COLONOSCOPY WITH PROPOFOL;  Surgeon: Jeani Hawking, MD;  Location: [facility] ENDOSCOPY;  Service: Endoscopy;  Laterality: N/A;   ESOPHAGOGASTRODUODENOSCOPY (EGD) WITH PROPOFOL N/A 03/11/2021   Procedure: ESOPHAGOGASTRODUODENOSCOPY (EGD) WITH PROPOFOL;  Surgeon: Jeani Hawking, MD;  Location[facility] ENDOSCOPY;  Service: Endoscopy;  Laterality: N/A;   ESOPHAGOGASTRODUODENOSCOPY (EGD) WITH PROPOFOL N/A 09/26/2021   Procedure: ESOPHAGOGASTRODUODENOSCOPY (EGD) WITH PROPOFOL;  Surgeon: Meryl Dare, MD;  Location: [facility] ENDOSCOPY;  Service: Endoscopy;  Laterality: N/A;   EUS N/A 09/21/2013   Procedure: ESOPHAGEAL ENDOSCOPIC ULTRASOUND (EUS) RADIAL;  Surgeon: Theda Belfast, MD;  Location: [facility] ENDOSCOPY;  Service: Endoscopy;  Laterality: N/A;   EUS N/A 09/30/2013   Procedure: UPPER ENDOSCOPIC ULTRASOUND (EUS) LINEAR;  Surgeon: Theda Belfast, MD;  Location: [facility] ENDOSCOPY;  Service: Endoscopy;  Laterality: N/A;   FINE NEEDLE ASPIRATION N/A 09/21/2013   Procedure: FINE NEEDLE ASPIRATION (FNA) LINEAR;  Surgeon: Theda Belfast, MD;  Location: [facility] ENDOSCOPY;  Service: Endoscopy;  Laterality: N/A;   FINGER FRACTURE SURGERY Left 1995   5th digit   FRACTURE SURGERY     HEMOSTASIS CLIP PLACEMENT  03/11/2021   Procedure: HEMOSTASIS CLIP PLACEMENT;  Surgeon: Jeani Hawking, MD;  Location: [facility] ENDOSCOPY;  Service: Endoscopy;;   HEMOSTASIS CONTROL  03/11/2021   Procedure: HEMOSTASIS CONTROL;  Surgeon: Jeani Hawking, MD;  Location: [facility] ENDOSCOPY;  Service: Endoscopy;;   KNEE ARTHROSCOPY Bilateral 1987-1989   right-left   LAPAROSCOPY N/A 12/01/2013   Procedure: LAPAROSCOPY DIAGNOSTIC PANCREATICODUODENECTOMY WITH BILIARY AND PANCREATIC STENTS;  Surgeon: Almond Lint, MD;  Location: [facility] ORS;  Service: General;  Laterality: N/A;   LUMBAR DISC SURGERY  1990's   SAVORY DILATION N/A 03/11/2021   Procedure: SAVORY DILATION;  Surgeon: Jeani Hawking, MD;  Location: [facility] ENDOSCOPY;  Service: Endoscopy;  Laterality: N/A;   WHIPPLE PROCEDURE  12/01/2013     Social History:   reports that he has been smoking cigarettes. He has quit using smokeless tobacco.  His smokeless tobacco use included snuff. He reports current alcohol use of about 2.0 standard drinks of alcohol per week. He reports that he does not  use drugs.   Family History:  His family history includes CAD in his sister; COPD in his father and mother; Cancer in his mother; Hypertension in his father and mother.   Allergies No Known Allergies   Home Medications  Prior to Admission medications   Medication Sig Start Date End Date  Taking? Authorizing Provider  acetaminophen (TYLENOL) 500 MG tablet Take 500 mg by mouth every 6 (six) hours as needed for moderate pain.   Yes [provider]  dicyclomine (BENTYL) 20 MG tablet Take 1 tablet (20 mg total) by mouth 2 (two) times daily. Patient not taking: Reported on 04/17/2023 11/29/22   Gailen Shelter, PA  lipase/protease/amylase (CREON) 36000 UNITS CPEP capsule Take 1 capsule (36,000 Units total) by mouth with breakfast, with lunch, and with evening meal. Patient not taking: Reported on 11/29/2022 01/10/22   Virgina Norfolk, DO  metoCLOPramide (REGLAN) 10 MG tablet Take 1 tablet (10 mg total) by mouth every 6 (six) hours as needed for nausea or vomiting. Patient not taking: Reported on 03/12/2022 02/03/22   Molpus, Jonny Ruiz, MD  ondansetron (ZOFRAN) 4 MG tablet Take 1 tablet (4 mg total) by mouth every 8 (eight) hours as needed for nausea or vomiting. Patient not taking: Reported on 11/29/2022 12/03/21   Barrie Dunker B, PA-C  ondansetron (ZOFRAN-ODT) 4 MG disintegrating tablet Take 1 tablet (4 mg total) by mouth every 8 (eight) hours as needed for nausea or vomiting. Patient not taking: Reported on 04/17/2023 03/04/23   Smitty Knudsen, PA-C  pantoprazole (PROTONIX) 40 MG tablet Take 1 tablet (40 mg total) by mouth 2 (two) times daily. Patient not taking: Reported on 11/29/2022 01/10/22 03/13/23  Virgina Norfolk, DO  promethazine (PHENERGAN) 25 MG tablet Take 1 tablet (25 mg total) by mouth every 6 (six) hours as needed for nausea or vomiting. Patient not taking: Reported on 04/17/2023 11/29/22   Gailen Shelter, PA     Critical care time: 32 mins

## 2023-04-24 NOTE — Progress Notes (Signed)
PHARMACY - TOTAL PARENTERAL NUTRITION CONSULT NOTE  Indication: Gastrocolic fistula   Patient Measurements: Height: 6\' 3"  (190.5 cm) Weight: 64.5 kg (142 lb 3.2 oz) IBW/kg (Calculated) : 84.5 TPN AdjBW (KG): 53.2 Body mass index is 17.77 kg/m. 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  Patient presented on 7/19 with N/V x 4 days along with LLQ pain.  Per documentation, CT showed thickening of the distal transverse colon, where there is a complex fistulous connection between the transverse colon to the gastrojejunal anastomosis and adjacent small bowel loops.  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  Pharmacy consulted to manage TPN.  Patient was started on CLD on 7/22 and reported fullness/abdominal pain after eating.  He reports not eating for a week PTA.  He does not weigh himself, but he knows he has lost a lot of weight; his pants size reduced from a 36 to a 28 over a 6 month time frame.  He typically eats 2 meals a day (lunch and dinner) and he eats protein, vegetables and carb; he rarely eats fruits.  Glucose / Insulin: no hx DM - CBGs 163-191 on TPN (used 3 unit SSI 24hrs) Electrolytes: Na 130, CO2 21, K 4.7, Phos 2.8 (post KPhos 45 mmol 7/25), Mag 1.9 (post 4g 7/25), others WNL Renal: SCr < 1, BUN < 5 Hepatic: AST/ALT WNL, alk phos back wnl, tbili down to 0.9, albumin < 1.5, prealbumin < 5 on 7/24 Intake / Output; MIVF: UOP 0.7 ml/kg/hr, NS 50 ml/hr, emergent thoracentesis to -   GI Imaging: none since TPN initiation GI Surgeries / Procedures: none since TPN initiation  Central access: PICC placed 04/21/23 TPN start date: 04/22/23  Nutritional Goals:  RD Estimated Needs Total Energy Estimated Needs: 1610-9604 Total Protein Estimated Needs: 95-105 Total Fluid Estimated Needs: >/= 2L  Current Nutrition:   Clear liquids TPN  Plan:  Increase TPN to 80 ml/hr at 1800 (goal rate provides 96g AA, 1910 kCal, meeting 100% of needs) Electrolytes in TPN: Na 80 mEq/L, K 40 mEq/L, Ca 5 mEq/L, Mag 10 mEq/L, Phos 20 mmol/L, Cl:Ac 1:2  > adjusted electrolytes down due to TPN rate increase Add daily multivitamin and trace elements in TPN Increase to moderate SSI Q6H Thiamine 100mg  IV daily (added to TPN) x 5 days total or until TPN is at goal rate given refeeding Reduce NS to 10 ml/hr when new TPN bag starts  Standard TPN labs on Mon/Thurs, daily PRN F/u infectious workup, on empiric Vvancomycin, Zosyn and Micafungin per CCM  Thank you for allowing pharmacy to be a part of this patient's care.  Thelma Barge, PharmD Clinical Pharmacist

## 2023-04-25 ENCOUNTER — Inpatient Hospital Stay (HOSPITAL_COMMUNITY): Payer: BLUE CROSS/BLUE SHIELD

## 2023-04-25 DIAGNOSIS — E871 Hypo-osmolality and hyponatremia: Secondary | ICD-10-CM | POA: Diagnosis not present

## 2023-04-25 DIAGNOSIS — J9601 Acute respiratory failure with hypoxia: Secondary | ICD-10-CM | POA: Diagnosis not present

## 2023-04-25 DIAGNOSIS — K529 Noninfective gastroenteritis and colitis, unspecified: Secondary | ICD-10-CM | POA: Diagnosis not present

## 2023-04-25 DIAGNOSIS — N3001 Acute cystitis with hematuria: Secondary | ICD-10-CM | POA: Diagnosis not present

## 2023-04-25 LAB — POCT I-STAT 7, (LYTES, BLD GAS, ICA,H+H)
Acid-Base Excess: 2 mmol/L (ref 0.0–2.0)
Bicarbonate: 25.8 mmol/L (ref 20.0–28.0)
Calcium, Ion: 1.09 mmol/L — ABNORMAL LOW (ref 1.15–1.40)
HCT: 20 % — ABNORMAL LOW (ref 39.0–52.0)
Hemoglobin: 6.8 g/dL — CL (ref 13.0–17.0)
O2 Saturation: 100 %
Patient temperature: 97.6
Potassium: 3.1 mmol/L — ABNORMAL LOW (ref 3.5–5.1)
Sodium: 136 mmol/L (ref 135–145)
TCO2: 27 mmol/L (ref 22–32)
pCO2 arterial: 33.5 mmHg (ref 32–48)
pH, Arterial: 7.492 — ABNORMAL HIGH (ref 7.35–7.45)
pO2, Arterial: 283 mmHg — ABNORMAL HIGH (ref 83–108)

## 2023-04-25 LAB — BLOOD GAS, ARTERIAL
Acid-Base Excess: 6.6 mmol/L — ABNORMAL HIGH (ref 0.0–2.0)
Bicarbonate: 31.3 mmol/L — ABNORMAL HIGH (ref 20.0–28.0)
O2 Saturation: 95.5 %
Patient temperature: 37
pCO2 arterial: 44 mmHg (ref 32–48)
pH, Arterial: 7.46 — ABNORMAL HIGH (ref 7.35–7.45)
pO2, Arterial: 68 mmHg — ABNORMAL LOW (ref 83–108)

## 2023-04-25 LAB — CBC
HCT: 24.3 % — ABNORMAL LOW (ref 39.0–52.0)
Hemoglobin: 8.6 g/dL — ABNORMAL LOW (ref 13.0–17.0)
MCH: 30.2 pg (ref 26.0–34.0)
MCHC: 35.4 g/dL (ref 30.0–36.0)
MCV: 85.3 fL (ref 80.0–100.0)
Platelets: 75 10*3/uL — ABNORMAL LOW (ref 150–400)
RBC: 2.85 MIL/uL — ABNORMAL LOW (ref 4.22–5.81)
RDW: 16.8 % — ABNORMAL HIGH (ref 11.5–15.5)
WBC: 28.6 10*3/uL — ABNORMAL HIGH (ref 4.0–10.5)
nRBC: 0.3 % — ABNORMAL HIGH (ref 0.0–0.2)

## 2023-04-25 LAB — GLUCOSE, CAPILLARY
Glucose-Capillary: 150 mg/dL — ABNORMAL HIGH (ref 70–99)
Glucose-Capillary: 150 mg/dL — ABNORMAL HIGH (ref 70–99)
Glucose-Capillary: 175 mg/dL — ABNORMAL HIGH (ref 70–99)
Glucose-Capillary: 254 mg/dL — ABNORMAL HIGH (ref 70–99)
Glucose-Capillary: 306 mg/dL — ABNORMAL HIGH (ref 70–99)

## 2023-04-25 LAB — HEMOGLOBIN AND HEMATOCRIT, BLOOD
HCT: 18.9 % — ABNORMAL LOW (ref 39.0–52.0)
Hemoglobin: 6.6 g/dL — CL (ref 13.0–17.0)

## 2023-04-25 LAB — PREPARE RBC (CROSSMATCH)

## 2023-04-25 MED ORDER — ENOXAPARIN SODIUM 60 MG/0.6ML IJ SOSY
60.0000 mg | PREFILLED_SYRINGE | Freq: Two times a day (BID) | INTRAMUSCULAR | Status: DC
  Administered 2023-04-25 – 2023-05-07 (×24): 60 mg via SUBCUTANEOUS
  Filled 2023-04-25 (×25): qty 0.6

## 2023-04-25 MED ORDER — FENTANYL CITRATE PF 50 MCG/ML IJ SOSY
PREFILLED_SYRINGE | INTRAMUSCULAR | Status: AC
  Administered 2023-04-25: 100 ug via INTRAVENOUS
  Filled 2023-04-25: qty 2

## 2023-04-25 MED ORDER — ORAL CARE MOUTH RINSE: 15.0000 mL | OROMUCOSAL | Status: DC | PRN

## 2023-04-25 MED ORDER — ENOXAPARIN SODIUM 80 MG/0.8ML IJ SOSY
1.0000 mg/kg | PREFILLED_SYRINGE | Freq: Two times a day (BID) | INTRAMUSCULAR | Status: DC
  Administered 2023-04-25: 65 mg via SUBCUTANEOUS
  Filled 2023-04-25 (×2): qty 0.65

## 2023-04-25 MED ORDER — FUROSEMIDE 10 MG/ML IJ SOLN
40.0000 mg | Freq: Four times a day (QID) | INTRAMUSCULAR | Status: AC
  Administered 2023-04-25 (×3): 40 mg via INTRAVENOUS
  Filled 2023-04-25 (×3): qty 4

## 2023-04-25 MED ORDER — MIDAZOLAM HCL 2 MG/2ML IJ SOLN
INTRAMUSCULAR | Status: AC
  Administered 2023-04-25: 2 mg
  Filled 2023-04-25: qty 2

## 2023-04-25 MED ORDER — PHENYLEPHRINE 80 MCG/ML (10ML) SYRINGE FOR IV PUSH (FOR BLOOD PRESSURE SUPPORT)
PREFILLED_SYRINGE | INTRAVENOUS | Status: AC
  Administered 2023-04-25: 160 ug via INTRAVENOUS
  Filled 2023-04-25: qty 10

## 2023-04-25 MED ORDER — NOREPINEPHRINE 4 MG/250ML-% IV SOLN
INTRAVENOUS | Status: AC
  Administered 2023-04-25: 2 ug/min via INTRAVENOUS
  Filled 2023-04-25: qty 250

## 2023-04-25 MED ORDER — CALCIUM GLUCONATE-NACL 2-0.675 GM/100ML-% IV SOLN
2.0000 g | Freq: Once | INTRAVENOUS | Status: AC
  Administered 2023-04-25: 2000 mg via INTRAVENOUS
  Filled 2023-04-25: qty 100

## 2023-04-25 MED ORDER — FENTANYL CITRATE PF 50 MCG/ML IJ SOSY: 100.0000 ug | PREFILLED_SYRINGE | Freq: Once | INTRAMUSCULAR | Status: AC

## 2023-04-25 MED ORDER — POTASSIUM PHOSPHATES 15 MMOLE/5ML IV SOLN
15.0000 mmol | Freq: Once | INTRAVENOUS | Status: AC
  Administered 2023-04-25: 15 mmol via INTRAVENOUS
  Filled 2023-04-25: qty 5

## 2023-04-25 MED ORDER — SODIUM CHLORIDE 0.9% IV SOLUTION: Freq: Once | INTRAVENOUS | Status: AC

## 2023-04-25 MED ORDER — NOREPINEPHRINE 4 MG/250ML-% IV SOLN
0.0000 ug/min | INTRAVENOUS | Status: DC
  Administered 2023-04-25: 4 ug/min via INTRAVENOUS
  Filled 2023-04-25: qty 250

## 2023-04-25 MED ORDER — POTASSIUM CHLORIDE 10 MEQ/50ML IV SOLN
10.0000 meq | INTRAVENOUS | Status: AC
  Administered 2023-04-25 (×6): 10 meq via INTRAVENOUS
  Filled 2023-04-25 (×2): qty 50

## 2023-04-25 MED ORDER — MAGNESIUM SULFATE 2 GM/50ML IV SOLN
2.0000 g | Freq: Once | INTRAVENOUS | Status: AC
  Administered 2023-04-25: 2 g via INTRAVENOUS
  Filled 2023-04-25: qty 50

## 2023-04-25 MED ORDER — DEXMEDETOMIDINE HCL IN NACL 400 MCG/100ML IV SOLN
0.0000 ug/kg/h | INTRAVENOUS | Status: DC
  Administered 2023-04-25: 1 ug/kg/h via INTRAVENOUS
  Administered 2023-04-25 (×2): 0.4 ug/kg/h via INTRAVENOUS
  Administered 2023-04-25: 0.7 ug/kg/h via INTRAVENOUS
  Administered 2023-04-26: 0.9 ug/kg/h via INTRAVENOUS
  Administered 2023-04-26: 1.2 ug/kg/h via INTRAVENOUS
  Administered 2023-04-26: 0.9 ug/kg/h via INTRAVENOUS
  Administered 2023-04-26: 0.7 ug/kg/h via INTRAVENOUS
  Administered 2023-04-27: 1.1 ug/kg/h via INTRAVENOUS
  Administered 2023-04-27 (×2): 0.9 ug/kg/h via INTRAVENOUS
  Administered 2023-04-28: 0.6 ug/kg/h via INTRAVENOUS
  Administered 2023-04-28: 0.7 ug/kg/h via INTRAVENOUS
  Administered 2023-04-28: 1.1 ug/kg/h via INTRAVENOUS
  Administered 2023-04-28: 0.7 ug/kg/h via INTRAVENOUS
  Filled 2023-04-25 (×13): qty 100

## 2023-04-25 MED ORDER — ORAL CARE MOUTH RINSE
15.0000 mL | OROMUCOSAL | Status: DC
  Administered 2023-04-25 – 2023-04-28 (×42): 15 mL via OROMUCOSAL

## 2023-04-25 MED ORDER — ETOMIDATE 2 MG/ML IV SOLN: 20.0000 mg | Freq: Once | INTRAVENOUS | Status: AC

## 2023-04-25 MED ORDER — EPINEPHRINE 1 MG/10ML IJ SOSY: PREFILLED_SYRINGE | INTRAMUSCULAR | Status: AC | PRN

## 2023-04-25 MED ORDER — FAMOTIDINE 20 MG PO TABS: 20.0000 mg | ORAL_TABLET | Freq: Two times a day (BID) | ORAL | Status: DC

## 2023-04-25 MED ORDER — DIAZEPAM 5 MG/ML IJ SOLN
2.5000 mg | Freq: Once | INTRAMUSCULAR | Status: DC
  Filled 2023-04-25: qty 2

## 2023-04-25 MED ORDER — VASOPRESSIN 20 UNITS/100 ML INFUSION FOR SHOCK: 0.0400 [IU]/min | INTRAVENOUS | Status: DC

## 2023-04-25 MED ORDER — FENTANYL 2500MCG IN NS 250ML (10MCG/ML) PREMIX INFUSION
12.5000 ug/h | INTRAVENOUS | Status: DC
  Administered 2023-04-26: 75 ug/h via INTRAVENOUS
  Filled 2023-04-25: qty 250

## 2023-04-25 MED ORDER — HYDROCORTISONE SOD SUC (PF) 100 MG IJ SOLR
50.0000 mg | Freq: Four times a day (QID) | INTRAMUSCULAR | Status: DC
  Administered 2023-04-25 – 2023-04-26 (×4): 50 mg via INTRAVENOUS
  Filled 2023-04-25 (×4): qty 2

## 2023-04-25 MED ORDER — EPINEPHRINE 1 MG/10ML IJ SOSY
PREFILLED_SYRINGE | INTRAMUSCULAR | Status: AC | PRN
Start: 2023-04-25 — End: 2023-04-26

## 2023-04-25 MED ORDER — FENTANYL 2500MCG IN NS 250ML (10MCG/ML) PREMIX INFUSION
INTRAVENOUS | Status: AC
  Administered 2023-04-25: 50 ug/h via INTRAVENOUS
  Filled 2023-04-25: qty 250

## 2023-04-25 MED ORDER — ROCURONIUM BROMIDE 10 MG/ML (PF) SYRINGE: 80.0000 mg | PREFILLED_SYRINGE | Freq: Once | INTRAVENOUS | Status: AC

## 2023-04-25 MED ORDER — MIDAZOLAM HCL 2 MG/2ML IJ SOLN
2.0000 mg | Freq: Once | INTRAMUSCULAR | Status: AC
  Administered 2023-04-29: 2 mg via INTRAVENOUS
  Filled 2023-04-25: qty 2

## 2023-04-25 MED ORDER — INSULIN ASPART 100 UNIT/ML IJ SOLN
0.0000 [IU] | INTRAMUSCULAR | Status: DC
  Administered 2023-04-25: 3 [IU] via SUBCUTANEOUS
  Administered 2023-04-25: 4 [IU] via SUBCUTANEOUS
  Administered 2023-04-25: 15 [IU] via SUBCUTANEOUS
  Administered 2023-04-25: 4 [IU] via SUBCUTANEOUS
  Administered 2023-04-26 (×3): 3 [IU] via SUBCUTANEOUS
  Administered 2023-04-26: 4 [IU] via SUBCUTANEOUS
  Administered 2023-04-26: 3 [IU] via SUBCUTANEOUS
  Administered 2023-04-26: 4 [IU] via SUBCUTANEOUS
  Administered 2023-04-27 (×3): 3 [IU] via SUBCUTANEOUS
  Administered 2023-04-27 (×2): 4 [IU] via SUBCUTANEOUS
  Administered 2023-04-28 (×2): 3 [IU] via SUBCUTANEOUS

## 2023-04-25 MED ORDER — SUCCINYLCHOLINE CHLORIDE 200 MG/10ML IV SOSY
PREFILLED_SYRINGE | INTRAVENOUS | Status: AC
  Filled 2023-04-25: qty 10

## 2023-04-25 MED ORDER — INSULIN GLARGINE-YFGN 100 UNIT/ML ~~LOC~~ SOLN
10.0000 [IU] | Freq: Two times a day (BID) | SUBCUTANEOUS | Status: DC
  Administered 2023-04-25 – 2023-04-26 (×4): 10 [IU] via SUBCUTANEOUS
  Filled 2023-04-25 (×6): qty 0.1

## 2023-04-25 MED ORDER — ENOXAPARIN SODIUM 60 MG/0.6ML IJ SOSY
60.0000 mg | PREFILLED_SYRINGE | Freq: Two times a day (BID) | INTRAMUSCULAR | Status: DC
  Filled 2023-04-25: qty 0.6

## 2023-04-25 MED ORDER — HYDROCORTISONE SOD SUC (PF) 100 MG IJ SOLR
100.0000 mg | Freq: Three times a day (TID) | INTRAMUSCULAR | Status: DC
  Administered 2023-04-25: 100 mg via INTRAVENOUS
  Filled 2023-04-25 (×2): qty 2

## 2023-04-25 MED ORDER — ENOXAPARIN SODIUM 80 MG/0.8ML IJ SOSY: 1.0000 mg/kg | PREFILLED_SYRINGE | Freq: Every day | INTRAMUSCULAR | Status: DC

## 2023-04-25 MED ORDER — PHENYLEPHRINE 80 MCG/ML (10ML) SYRINGE FOR IV PUSH (FOR BLOOD PRESSURE SUPPORT): 160.0000 ug | PREFILLED_SYRINGE | Freq: Once | INTRAVENOUS | Status: AC | PRN

## 2023-04-25 MED ORDER — PHENYLEPHRINE 80 MCG/ML (10ML) SYRINGE FOR IV PUSH (FOR BLOOD PRESSURE SUPPORT)
160.0000 ug | PREFILLED_SYRINGE | Freq: Once | INTRAVENOUS | Status: AC | PRN
  Administered 2023-04-25: 160 ug via INTRAVENOUS

## 2023-04-25 MED ORDER — ROCURONIUM BROMIDE 10 MG/ML (PF) SYRINGE
PREFILLED_SYRINGE | INTRAVENOUS | Status: AC
  Administered 2023-04-25: 80 mg via INTRAVENOUS
  Filled 2023-04-25: qty 10

## 2023-04-25 MED ORDER — ETOMIDATE 2 MG/ML IV SOLN
INTRAVENOUS | Status: AC
  Administered 2023-04-25: 20 mg via INTRAVENOUS
  Filled 2023-04-25: qty 20

## 2023-04-25 MED ORDER — SODIUM CHLORIDE 0.9 % IV SOLN: INTRAVENOUS | Status: DC | PRN

## 2023-04-25 MED ORDER — FENTANYL BOLUS VIA INFUSION
50.0000 ug | INTRAVENOUS | Status: DC | PRN
  Administered 2023-04-26: 50 ug via INTRAVENOUS

## 2023-04-25 MED ORDER — FENTANYL CITRATE PF 50 MCG/ML IJ SOSY: 50.0000 ug | PREFILLED_SYRINGE | Freq: Once | INTRAMUSCULAR | Status: DC

## 2023-04-25 MED ORDER — TRACE MINERALS CU-MN-SE-ZN 300-55-60-3000 MCG/ML IV SOLN
INTRAVENOUS | Status: AC
  Filled 2023-04-25: qty 644.8

## 2023-04-25 NOTE — Progress Notes (Addendum)
eLink Physician-Brief Progress Note Patient Name: Brent Rogers DOB: [birth year] MRN: [id number]   Date of Service  04/25/2023  HPI/Events of Note  Brief New admit note: 55 male shifted from floor for   worsening type 2 Acute respiratory failure w/ hypoxia-ARDS/Multifocal PNA: likely aspiration. Getting ready for Intubated in ICU. S/p thoracentesis straw fluids.   Sepsis ; suspected intra abdominal infection/ascites.  UTI. PNA. Effusion. On TPN.  Cachectic/PEM. Gastro colonic fistula. S/p whipple procedure in the past.   Electrolyte imbalance, hyponatremia. Encephalopathy  DVT-Age indeterminate right CFV  Anemia  HTN/CAD  Depression/anxiety   Data: reviewed 7.46/44/68/31  Cr normal Sodium 130 BNP 571 Wbc 32 K, Hg 9.2, plt 104 CxR worsening multilobar air space densities. PICC line.  CT abd: from 25 th enterocolitis?  Camera evaluation done: Discussed with RN. On BiPAP. Cachectic. Sast 92%. Sinus tachy.  RR 36.  To go on ARDS net lung protective ventilation.  Sedation. VAP bundle. On IV antibiotics, anti fungal. On SUP. Consider Bronch-BAL once stabilize by Pulmonologist.consider ID consultation if not done earlier. ATF:TDDUKGU SQ. 1 mg/kg for DVT.  CBG goals < 180. on SSI.     Brent Rogers. [name], MD, FCCP. [phone]  eICU Interventions       Intervention Category Major Interventions: Respiratory failure - evaluation and management Minor Interventions: Communication with other healthcare providers and/or family Evaluation Type: New Patient Evaluation  Brent Rogers 04/25/2023, 3:09 AM  05:20 AM ABG reviewed. Continue care Hg at 6.5,, no bleeding, was at 9.2 on yesterday. - repeat Hg again to confirm, if < 7 to call back. BMP pending   6"06 AM Hg still low. - 2 units PRBC and post transfusion H/H ordered  K/mag replacement ordered, cr normal KUB ordered for OG placement.

## 2023-04-25 NOTE — Progress Notes (Signed)
Asked to initiate precedex gtt and assist with transfer to ICU. Precedex gtt started at 0.58mcg. Pt transferred to 2H24 with 4E RN, RT, and RRT. Pt was intubated on arrival.

## 2023-04-25 NOTE — Progress Notes (Addendum)
Nutrition Follow-up  DOCUMENTATION CODES:   Severe malnutrition in context of chronic illness, Underweight  INTERVENTION:   TPN order per Pharmacy: RD updated nutritional needs with change in status -Recommend very close monitoring of electrolytes with supplementation as needed for low values given pt was at very high risk for refeeding on initiation -If difficulty managing blood sugars due to critical illness while on TPN, recommend insulin drip.   NUTRITION DIAGNOSIS:   Severe Malnutrition related to chronic illness as evidenced by severe fat depletion, severe muscle depletion.  Being addressed via TPN  GOAL:   Patient will meet greater than or equal to 90% of their needs  Met via TPN  MONITOR:   PO intake, Diet advancement, Labs, Weight trends, Skin, I & O's  REASON FOR ASSESSMENT:   Consult New TPN/TNA  ASSESSMENT:   57 y.o. 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 Patient found to have  fistulous connections to GJ junction and small bowel  7/19 Admitted 7/22 Started on CL 7/23 PICC line placed 7/24 TPN initiated 7/25 CT abdomen: severe fatty liver, thickened appearance of colon 7/26 Emergent thoracentesis with 800 mL removed 7/27 Transferred to ICU early in AM, Intubated, ARDS  Pt remains on vent support, sedated with fentanyl and precedex. Levophed at 6  Pt with GJ-colonic fistula with plans for possible surgery in the next 2-3 weeks  TPN at 50 ml/hr with initial goal of 80 ml/hr; noted TPN today concentrated to goal of 65 ml/hr providing 97 g of protein, 1911 kcals and 1560 mL of fluid. Noted IV thiamine 100 mg added to TPN x 5 days. Currently supplementing calcium, potassium and phosphorus.   Admit weight: 53.2 kg Weight on TPN initiation (7/24) 62.5  kg Current wt 64 kg  Noted non-pitting edema in BLE; UOP 4.7 L in 24 hours  Noted phosphorus <1.0 on 7/24 in AM, received supplementation, current phosphorus 2.0 and receiving supplementation. Potassium also low. Pt possibly refeeding given SEVERE REFEEDING RISK on initiation of TPN  Noted CBGs >180; given worsening acute illness while on TPN, recommend insulin drip if unable to maintain CBGs within 140-180  Labs: sodium 131 (L), potassium 3.3 (L), phosphorus 2.0 (L), CBGs 150-254 (ICU goal 140-180) Meds: potassium chloride, potassium phosphate, mag sulfate, lasix   Diet Order:   Diet Order             Diet NPO time specified  Diet effective now                   EDUCATION NEEDS:   Education needs have been addressed  Skin:  Skin Assessment: Reviewed RN Assessment  Last BM:  7/26  Height:   Ht Readings from Last 1 Encounters:  04/18/23 6\' 3"  (1.905 m)    Weight:   Wt Readings from Last 1 Encounters:  04/25/23 64 kg    BMI:  Body mass index is 17.64 kg/m.  Estimated Nutritional Needs:   Kcal:  1850-2150 kcals  Protein:  110-125 g  Fluid:  1.8 L   Brent Starcher MS, RDN, LDN, CNSC Registered Dietitian 3 Clinical Nutrition RD Pager and On-Call Pager Number Located in [place]

## 2023-04-25 NOTE — Progress Notes (Signed)
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  Attempting conservative management and TPN to try to help nutritional status.  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  Denies any frank aspiration, breathing worsened after a BM.  CXR with worsening bilateral infiltrates. After discussion with rapid response and crosscover TRH, PCCM consulted to assist with management.  Patient states breathing is stable from this am, has not been able to sleep due to anxiety over SOB.  2022 echo normal  Pertinent  Medical History  Chronic pancreatitis Prior Whipple Severe protein calorie malnutrition Question of alcohol abuse but multiple previous notes state his use is modest at best.  I do see a level of 134 back in 2021. HTN  Significant Hospital Events: Including procedures, antibiotic start and stop dates in addition to other pertinent events   7/19 admit 04/24/2023 thoracentesis with 800 cc of fluid obtained  Interim History / Subjective:  Patient is with increasing shortness of breath, placed on BiPAP then titrated down to high flow nasal cannula oxygen, overnight became hypoxic requiring endotracheal intubation and mechanical ventilation Remain afebrile  Objective   Blood pressure 133/84, pulse 67, temperature 97.6 F (36.4 C), temperature source Axillary, resp. rate (!) 26, height 6\' 3"  (1.905 m), weight 64 kg, SpO2 100%.    Vent Mode: PRVC FiO2 (%):  [40 %-100 %] 40 % Set Rate:  [26 bmp-28 bmp] 26 bmp Vt Set:  [620 mL] 620 mL PEEP:  [8 cmH20-10 cmH20] 8 cmH20 Plateau Pressure:  [25 cmH20-32 cmH20] 25 cmH20    Intake/Output Summary (Last 24 hours) at 04/25/2023 0747 Last data filed at 04/25/2023 0400 Gross per 24 hour  Intake 1775.92 ml  Output 4700 ml  Net -2924.08 ml   Filed Weights   04/22/23 0834 04/23/23 0646 04/25/23 0306  Weight: 62.5 kg 64.5 kg 64 kg    Examination: General: Crtitically ill-appearing cachectic male with temporal wasting, orally intubated HEENT: [place]/AT, eyes anicteric.  ETT and OGT in place Neuro: Sedated, not following commands.  Eyes are closed.  Pupils 3 mm bilateral reactive to light Chest: Bilateral crackles all over, no wheezes or rhonchi Heart: Regular rate and rhythm, no murmurs or gallops Abdomen: Soft, nondistended, bowel sounds present Skin: No rash  Labs and images were reviewed  Resolved Hospital Problem list   N/A  Assessment & Plan:  Acute hypoxic respiratory failure Septic shock due to aspiration pneumonia Pleural effusion s/p thoracentesis Severe leukemoid reaction Acute colitis Acute urinary tract infection with Klebsiella Gastrocolic fistula, 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 Chronic severe protein calorie malnutrition on TPN Hyponatremia/hypokalemia/hypophosphatemia/hypomagnesemia/hypocalcemia Anemia of critical illness Thrombocytopenia Cachexia/severe protein calorie malnutrition  Continue lung protective ventilation VAP prevention bundle in place PAD protocol with Precedex and fentanyl Continue titrate vasopressor support Continue stress dose steroids Continue IV Lasix Continue broad-spectrum antibiotics with Vanco, Zosyn and micafungin Follow-up respiratory culture Urine culture grew Klebsiella General surgery is following Continue TPN Monitor and supplement electrolytes Monitor H&H and platelet count   Best Practice (right click and "Reselect all SmartList Selections" daily)   Diet/type: NPO DVT prophylaxis: Subcu anoxic GI prophylaxis: PPI Lines: Central line Foley:  N/A Code  Status:  full code Last date of multidisciplinary goals of care discussion: Pending  Labs   CBC: Recent Labs  Lab 04/19/23 0022 04/20/23 0228 04/23/23 1240 04/23/23 1507 04/23/23 2353 04/24/23 1519 04/25/23 0426 04/25/23 0438 04/25/23 0532  WBC 10.7*  11.1*   < > 33.5* 36.3* 44.9* 32.0*  --  22.0*  --   NEUTROABS 7.3  --   --   --  40.4*  --   --   --   --   HGB 8.3*  8.6*   < > 9.1* 9.5* 10.1* 9.2* 6.8* 6.5* 6.6*  HCT 23.3*  24.1*   < > 25.1* 26.4* 28.5* 25.9* 20.0* 18.2* 18.9*  MCV 82.9  82.3   < > 84.5 86.0 84.3 86.6  --  86.3  --   PLT 70*  71*   < > 148* 154 133* 104*  --  83*  --    < > = values in this interval not displayed.    Basic Metabolic Panel: Recent Labs  Lab 04/22/23 0355 04/22/23 1450 04/23/23 0505 04/23/23 1507 04/23/23 2353 04/24/23 0858 04/25/23 0426 04/25/23 0438  NA 132* 130* 128* 128* 128* 130* 136 131*  K 3.2* 3.2* 4.1 6.0* 5.1 4.7 3.1* 3.3*  CL 102 104 100 101 100 99  --  97*  CO2 22 18* 20* 19* 20* 21*  --  27  GLUCOSE 140* 149* 109* 309* 169* 185*  --  171*  BUN <5* <5* <5* <5* <5* <5*  --  <5*  CREATININE 0.54* 0.52* 0.50* 0.50* 0.56* 0.70  --  0.53*  CALCIUM 7.4* 6.4* 7.2* 6.7* 7.0* 7.4*  --  7.7*  MG 1.4* 1.8 1.8 2.6* 2.0 1.9  --  1.7  PHOS <1.0* 1.6* 1.6*  --   --  2.8  --  2.0*   GFR: Estimated Creatinine Clearance: 92.2 mL/min (A) (by C-G formula based on SCr of 0.53 mg/dL (L)). Recent Labs  Lab 04/23/23 1507 04/23/23 1922 04/23/23 2353 04/24/23 0352 04/24/23 0858 04/24/23 1519 04/25/23 0438  PROCALCITON  --   --   --   --  1.73  --   --   WBC 36.3*  --  44.9*  --   --  32.0* 22.0*  LATICACIDVEN  --  3.9*  --  3.5* 4.1*  --   --     Liver Function Tests: Recent Labs  Lab 04/22/23 0355 04/23/23 0505 04/23/23 2353  AST 26 26 32  ALT 22 21 15   ALKPHOS 139* 142* 121  BILITOT 1.5* 1.3* 0.9  PROT 4.3* 4.5* 4.2*  ALBUMIN 1.6* 1.6* <1.5*   No results for input(s): "LIPASE", "AMYLASE" in the last 168  hours.  No results for input(s): "AMMONIA" in the last 168 hours.  ABG    Component Value Date/Time   PHART 7.492 (H) 04/25/2023 0426   PCO2ART 33.5 04/25/2023 0426   PO2ART 283 (H) 04/25/2023 0426   HCO3 25.8 04/25/2023 0426   TCO2 27 04/25/2023 0426   ACIDBASEDEF 6.2 (H) 04/23/2023 1700   O2SAT 100 04/25/2023 0426     Coagulation Profile: Recent Labs  Lab 04/23/23 1922  INR 1.6*    Cardiac Enzymes: No results for input(s): "CKTOTAL", "CKMB", "CKMBINDEX", "TROPONINI" in the last 168 hours.  HbA1C: Hgb A1c MFr Bld  Date/Time Value Ref Range Status  09/04/2021 06:45 AM 5.3 4.8 - 5.6 % Final    Comment:    (NOTE) Pre diabetes:  5.7%-6.4%  Diabetes:              >6.4%  Glycemic control for   <7.0% adults with diabetes   03/06/2021 08:18 PM 5.2 4.8 - 5.6 % Final    Comment:    (NOTE)         Prediabetes: 5.7 - 6.4         Diabetes: >6.4         Glycemic control for adults with diabetes: <7.0     CBG: Recent Labs  Lab 04/24/23 0609 04/24/23 1227 04/24/23 1809 04/25/23 0034 04/25/23 0626  GLUCAP 191* 170* 173* 150* 254*    The patient is critically ill due to acute respiratory failure with hypoxia/septic shock.  Critical care was necessary to treat or prevent imminent or life-threatening deterioration.  Critical care was time spent personally by me on the following activities: development of treatment plan with patient and/or surrogate as well as nursing, discussions with consultants, evaluation of patient's response to treatment, examination of patient, obtaining history from patient or surrogate, ordering and performing treatments and interventions, ordering and review of laboratory studies, ordering and review of radiographic studies, pulse oximetry, re-evaluation of patient's condition and participation in multidisciplinary rounds.   During this encounter critical care time was devoted to patient care services described in this note for 39  minutes.     Cheri Fowler, MD [facility] See Amion for pager If no response to pager, please call [phone] until 7pm After 7pm, Please call E-link [phone]

## 2023-04-25 NOTE — Progress Notes (Signed)
RT unable to collect sputum culture, RN informed.

## 2023-04-25 NOTE — Progress Notes (Signed)
Received from 4E on BIPAP with TPN and Dex drip infusing.Alert and oriented.Able to follow commands.Tachypneic and labored breathing. PA Suzie Portela at bedside.

## 2023-04-25 NOTE — Progress Notes (Signed)
Patient is transferred to [facility]

## 2023-04-25 NOTE — Progress Notes (Signed)
Patient's MEWS has gotten worse and sustained on RED due to High Respiratory rate, and elevated heart rate, MD on call notified. Critical care get involved and stat chest Xray and blood gas ordered. CC notified RR for assistance in patient's transfer to 2H for intubation.

## 2023-04-25 NOTE — Progress Notes (Signed)
PHARMACY - TOTAL PARENTERAL NUTRITION CONSULT NOTE  Indication: Gastrocolic fistula   Patient Measurements: Height: 6\' 3"  (190.5 cm) Weight: 64 kg (141 lb 1.5 oz) IBW/kg (Calculated) : 84.5 TPN AdjBW (KG): 53.2 Body mass index is 17.64 kg/m. 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  Patient presented on 7/19 with N/V x 4 days along with LLQ pain.  Per documentation, CT showed thickening of the distal transverse colon, where there is a complex fistulous connection between the transverse colon to the gastrojejunal anastomosis and adjacent small bowel loops.  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  Pharmacy consulted to manage TPN.  Patient was started on CLD on 7/22 and reported fullness/abdominal pain after eating.  He reports not eating for a week PTA.  He does not weigh himself, but he knows he has lost a lot of weight; his pants size reduced from a 36 to a 28 over a 6 month time frame.  He typically eats 2 meals a day (lunch and dinner) and he eats protein, vegetables and carb; he rarely eats fruits. Pt emergently intubated 7/27 and made NPO status.  Glucose / Insulin: no hx DM - CBGs 170-254 on TPN (used 16 unit SSI 24hrs), started on steroids 7/27 Electrolytes: Na 131, K 3.3, Cl 97, Phos 2.0, Mag 1.7, iCa 1.09 Renal: SCr < 1, BUN < 5 Hepatic: 7/25 AST/ALT WNL, alk phos back wnl, tbili down to 0.9, albumin < 1.5, prealbumin < 5 on 7/24 Intake / Output; MIVF: UOP 3.1 ml/kg/hr, 7/26 emergent thoracentesis to -  GI Imaging:  7/25 CT Abd: severe fatty liver, thickened appearance of colon GI Surgeries / Procedures: none since TPN initiation  Central access: PICC placed 04/21/23 TPN start date: 04/22/23  Nutritional Goals:  RD Estimated Needs Total Energy Estimated Needs: 1610-9604 Total Protein Estimated Needs:  95-105 Total Fluid Estimated Needs: >/= 2L  Current Nutrition:  NPO + TPN  Plan:  Concentrate TPN at 65 ml/hr (goal rate provides 97g AA, 1911 kCal, meeting 100% of needs) Electrolytes in TPN: maximize Na 154 mEq/L, K 45 mEq/L, Ca 5 mEq/L, increase Mag 13 mEq/L, increase Phos 30 mmol/L, change Cl:Ac to 1:2 Add daily multivitamin and trace elements in TPN Adjust to resistant SSI Q6H, adjust pending steroid plan Thiamine 100mg  IV daily (added to TPN) x 5 days total or until TPN is at goal rate given refeeding Supplement Ca 2 g, K 60 mEq, potassium phosphate 15 mmol Standard TPN labs on Mon/Thurs, daily PRN  Thank you for allowing pharmacy to be a part of this patient's care.  Rutherford Nail, PharmD PGY2 Critical Care Pharmacy Resident

## 2023-04-25 NOTE — Progress Notes (Signed)
PT Cancellation Note  Patient Details Name: Brent Rogers MRN: [id number] DOB: March 01, 1966   Cancelled Treatment:    Reason Eval/Treat Not Completed: Patient not medically ready. Pt intubated and sedated.    Angelina Ok [facility] 04/25/2023, 10:50 AM Skip Mayer PT Acute [employer] [phone]

## 2023-04-25 NOTE — Progress Notes (Addendum)
PCCM called to bedside for patient with worsening WOB and tachypnea. Attempted to wean from bipap to [facility] earlier today but unable to tolerate coming off bipap due to wob. CXR w/ worsening b/l infiltrates. Increasing o2 requirements on 80% fio2 on bipap w/ sats 88-90%. Patient states having difficulty breathing. UOP last 24 hours 3.6 L.   Acute respiratory failure w/ hypoxia ARDS Multifocal PNA: likely aspiration Plan: -transfer to icu -will intubate -LTVV strategy with tidal volumes of 6-8 cc/kg ideal body weight -check ABG and adjust settings accordingly -check trach aspirate -cont current abx zosyn, vanc, micafungin -start on steroids  R age indeterminate DVT Plan: -1mg /kg lovenox  JD Anselm Lis [facility] 04/25/2023, 2:24 AM  Please see [email or web address] for pager details.  From 7A-7P if no response, please call [phone]. After hours, please call ELink [phone].

## 2023-04-25 NOTE — Plan of Care (Signed)

## 2023-04-25 NOTE — Progress Notes (Signed)
Subjective/Chief Complaint: Ill overnight, intubated and sedated, appears to be respiratory   Objective: Vital signs in last 24 hours: Temp:  [97.5 F (36.4 C)-98.6 F (37 C)] 97.6 F (36.4 C) (07/27 0306) Pulse Rate:  [47-128] 67 (07/27 0737) Resp:  [21-47] 26 (07/27 0737) BP: (59-145)/(49-114) 133/84 (07/27 0737) SpO2:  [76 %-100 %] 100 % (07/27 0737) Arterial Line BP: (120-158)/(69-94) 131/81 (07/27 0600) FiO2 (%):  [40 %-100 %] 40 % (07/27 0737) Weight:  [64 kg] 64 kg (07/27 0306) Last BM Date : 04/24/23  Intake/Output from previous day: 07/26 0701 - 07/27 0700 In: 1775.9 [I.V.:835.6; IV Piggyback:940.3] Out: 4700 [Urine:4700] Intake/Output this shift: No intake/output data recorded.  Ab soft nondistended, unchanged  Lab Results:  Recent Labs    04/24/23 1519 04/25/23 0426 04/25/23 0438 04/25/23 0532  WBC 32.0*  --  22.0*  --   HGB 9.2*   < > 6.5* 6.6*  HCT 25.9*   < > 18.2* 18.9*  PLT 104*  --  83*  --    < > = values in this interval not displayed.   BMET Recent Labs    04/24/23 0858 04/25/23 0426 04/25/23 0438  NA 130* 136 131*  K 4.7 3.1* 3.3*  CL 99  --  97*  CO2 21*  --  27  GLUCOSE 185*  --  171*  BUN <5*  --  <5*  CREATININE 0.70  --  0.53*  CALCIUM 7.4*  --  7.7*   PT/INR Recent Labs    04/23/23 1922  LABPROT 19.2*  INR 1.6*   ABG Recent Labs    04/25/23 0230 04/25/23 0426  PHART 7.46* 7.492*  HCO3 31.3* 25.8    Studies/Results: DG Chest Port 1 View  Result Date: 04/25/2023 CLINICAL DATA:  Status post intubation. EXAM: PORTABLE CHEST 1 VIEW COMPARISON:  April 25, 2023 (1:45 a.m.) FINDINGS: There is stable right-sided PICC line positioning. Since the prior study there is been interval placement of an endotracheal tube with its distal tip approximately 4.3 cm from the carina. Interval enteric tube placement is also seen with its distal end extending below the level of the diaphragm. The heart size and mediastinal contours are  within normal limits. Stable, marked severity bilateral infiltrates are seen. No pleural effusion or pneumothorax is identified. The visualized skeletal structures are unremarkable. IMPRESSION: 1. Interval endotracheal and enteric tube placement, as described above. 2. Stable, marked severity bilateral infiltrates. Electronically Signed   By: Aram Candela M.D.   On: 04/25/2023 03:43   DG CHEST PORT 1 VIEW  Result Date: 04/25/2023 CLINICAL DATA:  Acute respiratory failure EXAM: PORTABLE CHEST 1 VIEW COMPARISON:  04/24/2023, 04/23/2023 FINDINGS: Right upper extremity central venous catheter tip over the SVC. Widespread heterogeneous airspace disease worse in the interim. Stable cardiomediastinal silhouette. IMPRESSION: Interval worsening of widespread heterogeneous airspace disease. Electronically Signed   By: Jasmine Pang M.D.   On: 04/25/2023 02:02   VAS [place] LOWER EXTREMITY VENOUS (DVT)  Result Date: 04/24/2023  Lower Venous DVT Study Patient Name:  Brent Rogers  Date of Exam:   04/24/2023 Medical Rec #: [id number]         Accession #:    [id number] Date of Birth: [birth year]          Patient Gender: M Patient Age:   57 years Exam Location:  [facility] Procedure:      VAS [place] LOWER EXTREMITY VENOUS (DVT) Referring Phys: Levon Hedger --------------------------------------------------------------------------------  Indications: Edema.  Risk Factors: None identified. Comparison Study: Intimal thickening/partial thrombosis developed since previous                   exam 04/01/21 Performing Technologist: Shona Simpson  Examination Guidelines: A complete evaluation includes B-mode imaging, spectral Doppler, color Doppler, and power Doppler as needed of all accessible portions of each vessel. Bilateral testing is considered an integral part of a complete examination. Limited examinations for reoccurring indications may be performed as noted. The reflux portion of the exam is performed with the patient in  reverse Trendelenburg.  +--------+---------------+---------+-----------+--------------+---------------+ RIGHT   CompressibilityPhasicitySpontaneityProperties    Thrombus Aging  +--------+---------------+---------+-----------+--------------+---------------+ CFV     Partial        Yes      Yes        softly        Age                                                        echogenic     Indeterminate   +--------+---------------+---------+-----------+--------------+---------------+ SFJ     Full                                                             +--------+---------------+---------+-----------+--------------+---------------+ FV Prox Full                                                             +--------+---------------+---------+-----------+--------------+---------------+ FV Mid  Full                                                             +--------+---------------+---------+-----------+--------------+---------------+ FV      Full                                                             Distal                                                                   +--------+---------------+---------+-----------+--------------+---------------+ PFV     Full                                                             +--------+---------------+---------+-----------+--------------+---------------+  POP     Full           Yes      Yes                                      +--------+---------------+---------+-----------+--------------+---------------+ PTV     Full                                                             +--------+---------------+---------+-----------+--------------+---------------+ PERO    Full                                                             +--------+---------------+---------+-----------+--------------+---------------+   +---------+---------------+---------+-----------+----------+--------------+ LEFT      CompressibilityPhasicitySpontaneityPropertiesThrombus Aging +---------+---------------+---------+-----------+----------+--------------+ CFV      Full           Yes      Yes                                 +---------+---------------+---------+-----------+----------+--------------+ SFJ      Full                                                        +---------+---------------+---------+-----------+----------+--------------+ FV Prox  Full                                                        +---------+---------------+---------+-----------+----------+--------------+ FV Mid   Full                                                        +---------+---------------+---------+-----------+----------+--------------+ FV DistalFull                                                        +---------+---------------+---------+-----------+----------+--------------+ PFV      Full                                                        +---------+---------------+---------+-----------+----------+--------------+ POP      Full           Yes      Yes                                 +---------+---------------+---------+-----------+----------+--------------+  PTV      Full                                                        +---------+---------------+---------+-----------+----------+--------------+ PERO     Full                                                        +---------+---------------+---------+-----------+----------+--------------+     Summary: RIGHT: - Findings consistent with age indeterminate deep vein thrombosis involving the right common femoral vein. - No cystic structure found in the popliteal fossa.  LEFT: - There is no evidence of deep vein thrombosis in the lower extremity.  - No cystic structure found in the popliteal fossa.  *See table(s) above for measurements and observations. Electronically signed by Waverly Ferrari MD on 04/24/2023 at 4:20:28  PM.    Final    DG Chest Port 1 View  Result Date: 04/24/2023 CLINICAL DATA:  Status post thoracentesis EXAM: PORTABLE CHEST 1 VIEW COMPARISON:  04/23/2019 FINDINGS: Cardiac shadow is within normal limits. Right PICC is again seen and stable. Patchy airspace opacities are again identified slightly worse on the left when compared with the prior exam. No pneumothorax is noted following thoracentesis. No sizable effusion is seen. IMPRESSION: No pneumothorax following right-sided thoracentesis. Electronically Signed   By: Alcide Clever M.D.   On: 04/24/2023 01:45   CT ABDOMEN PELVIS W CONTRAST  Result Date: 04/23/2023 CLINICAL DATA:  Sepsis abdominal pain. History of Whipple procedure. EXAM: CT ABDOMEN AND PELVIS WITH CONTRAST TECHNIQUE: Multidetector CT imaging of the abdomen and pelvis was performed using the standard protocol following bolus administration of intravenous contrast. RADIATION DOSE REDUCTION: This exam was performed according to the departmental dose-optimization program which includes automated exposure control, adjustment of the mA and/or kV according to patient size and/or use of iterative reconstruction technique. CONTRAST:  OMNIPAQUE IOHEXOL 300 MG/ML  SOLN COMPARISON:  CT abdomen pelvis dated 04/17/2023. FINDINGS: Lower chest: Partially visualized moderate bilateral pleural effusions with partial compressive atelectasis of the lower lobes, new since the prior CT. Diffuse patchy airspace density throughout the visualized lungs new since the prior CT. Findings may represent edema but concerning for pneumonia. Clinical correlation is recommended. There is coronary vascular calcification. No intra-abdominal free air.  Small ascites. Hepatobiliary: Severe fatty liver. Mild pneumobilia. Cholecystectomy. Pancreas: Postsurgical changes of Whipple involving the head of the pancreas. The body and tail of the pancreas appear unremarkable. Spleen: The spleen is unremarkable. Adrenals/Urinary  Tract: The adrenal glands are unremarkable. Small bilateral renal cysts. There is no hydronephrosis on either side. There is symmetric enhancement and excretion of contrast by both kidneys. The urinary bladder is grossly unremarkable. Stomach/Bowel: Postsurgical changes of the bowel related to Whipple surgery. There is diffuse thickened appearance of the colon and thickened appearance of the terminal ileum. Findings may be related to ascites or represent enterocolitis. Clinical correlation is recommended. There is no bowel obstruction. The appendix is normal. Vascular/Lymphatic: Moderate aortoiliac atherosclerotic disease. The IVC is unremarkable. No portal venous gas. There is no adenopathy. Reproductive: Ascites, diffuse mesenteric and subcutaneous edema and anasarca. Other: None Musculoskeletal: Osteopenia with degenerative changes of the  spine. No acute osseous pathology. IMPRESSION: 1. Partially visualized moderate bilateral pleural effusions with partial compressive atelectasis of the lower lobes, new since the prior CT. 2. Diffuse patchy airspace density throughout the visualized lungs new since the prior CT. Findings may represent edema but concerning for pneumonia. 3. Severe fatty liver. 4. Postsurgical changes of Whipple surgery. 5. Diffuse thickened appearance of the colon and terminal ileum may be related to ascites or represent enterocolitis. No bowel obstruction. Normal appendix. 6. Severe anasarca, new since the prior CT. 7.  Aortic Atherosclerosis (ICD10-I70.0). Electronically Signed   By: Elgie Collard M.D.   On: 04/23/2023 22:39   DG CHEST PORT 1 VIEW  Result Date: 04/23/2023 CLINICAL DATA:  Shortness of breath EXAM: PORTABLE CHEST 1 VIEW COMPARISON:  03/04/2023 FINDINGS: Cardiac shadow is stable. Right PICC is seen in satisfactory position. Diffuse bilateral airspace opacities are noted with both a alveolar and interstitial component. These changes may represent significant underlying edema  although diffuse bilateral pneumonia deserves consideration as well. Small effusions are noted bilaterally. IMPRESSION: Diffuse bilateral airspace opacities likely representing edema/inflammation. Electronically Signed   By: Alcide Clever M.D.   On: 04/23/2023 18:24    Anti-infectives: Anti-infectives (From admission, onward)    Start     Dose/Rate Route Frequency Ordered Stop   04/24/23 0300  micafungin (MYCAMINE) 100 mg in sodium chloride 0.9 % 100 mL IVPB        100 mg 105 mL/hr over 1 Hours Intravenous Daily 04/24/23 0108     04/24/23 0200  vancomycin (VANCOCIN) IVPB 1000 mg/200 mL premix        1,000 mg 200 mL/hr over 60 Minutes Intravenous 2 times daily 04/24/23 0114     04/23/23 1400  piperacillin-tazobactam (ZOSYN) IVPB 3.375 g        3.375 g 12.5 mL/hr over 240 Minutes Intravenous Every 8 hours 04/23/23 1138 04/30/23 1359   04/20/23 1200  cefTRIAXone (ROCEPHIN) 2 g in sodium chloride 0.9 % 100 mL IVPB  Status:  Discontinued        2 g 200 mL/hr over 30 Minutes Intravenous Every 24 hours 04/20/23 1133 04/23/23 1138   04/18/23 1600  cefTRIAXone (ROCEPHIN) 2 g in sodium chloride 0.9 % 100 mL IVPB  Status:  Discontinued        2 g 200 mL/hr over 30 Minutes Intravenous Every 24 hours 04/17/23 1807 04/20/23 1133   04/18/23 0500  metroNIDAZOLE (FLAGYL) IVPB 500 mg  Status:  Discontinued        500 mg 100 mL/hr over 60 Minutes Intravenous Every 12 hours 04/17/23 1807 04/23/23 1138   04/17/23 1600  cefTRIAXone (ROCEPHIN) 1 g in sodium chloride 0.9 % 100 mL IVPB        1 g 200 mL/hr over 30 Minutes Intravenous  Once 04/17/23 1550 04/17/23 1738   04/17/23 1600  metroNIDAZOLE (FLAGYL) IVPB 500 mg        500 mg 100 mL/hr over 60 Minutes Intravenous  Once 04/17/23 1550 04/17/23 1747       Assessment/Plan: 26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  This will almost certainly not heal on own but with current ntn not able to undergo surgery.   Surgery next 2-3 weeks depending on response and what his current clinical course is -continue TPN, ideally could cycle this at night as he could actually go  home on clears and TPN prior to having surgery pending his course -will see again Monday, call again if needed sooner  I reviewed Consultant ccm notes, last 24 h vitals and pain scores, last 48 h intake and output, last 24 h labs and trends, and last 24 h imaging results.         Emelia Loron 04/25/2023

## 2023-04-25 NOTE — Progress Notes (Signed)
OT Cancellation Note  Patient Details Name: Brent Rogers MRN: [id number] DOB: 20-May-1966   Cancelled Treatment:    Reason Eval/Treat Not Completed: Medical issues which prohibited therapy (Pt intubated, sedated. Will follow up for OT evaluation as appropriate.)  Carver Fila, OTD, OTR/L [employer] Preferred Acute Rehab [phone]   Dalphine Handing 04/25/2023, 10:32 AM

## 2023-04-25 NOTE — Procedures (Signed)
Arterial Catheter Insertion Procedure Note  Brent Rogers  [id number]  Oct 07, 1965  Date:04/25/23  Time:4:24 AM    Provider Performing: Benjamine Sprague, BS, RRT-ACCS, RCP   Procedure: Insertion of Arterial Line (40981) without US guidance, manual palpation used to locate and cannulate left arterial lumen.  Indication(s) Blood pressure monitoring and/or need for frequent ABGs  Consent Unable to obtain consent due to emergent nature of procedure.  Anesthesia None   Time Out Verified patient identification, verified procedure, site/side was marked, verified correct patient position, special equipment/implants available, medications/allergies/relevant history reviewed, required imaging and test results available.   Sterile Technique Maximal sterile technique including full sterile barrier drape, hand hygiene, sterile gown, sterile gloves, mask, hair covering, sterile ultrasound probe cover (if used).   Procedure Description Area of catheter insertion was cleaned with chlorhexidine and draped in sterile fashion. Without real-time ultrasound guidance an arterial catheter was placed into the left radial artery.  Appropriate arterial tracings confirmed on monitor.     Complications/Tolerance None; patient tolerated the procedure well.   Brent Rogers, BS, RRT-ACCS, RCP

## 2023-04-25 NOTE — Procedures (Signed)
Intubation Procedure Note  Brent Rogers  [id number]  [birth year]  Date:04/25/23  Time:3:20 AM   Provider Performing:Xiadani Damman D Suzie Portela    Procedure: Intubation (31500)  Indication(s) Respiratory Failure  Consent Unable to obtain consent due to emergent nature of procedure.   Anesthesia Etomidate, Versed, Fentanyl, and Rocuronium   Time Out Verified patient identification, verified procedure, site/side was marked, verified correct patient position, special equipment/implants available, medications/allergies/relevant history reviewed, required imaging and test results available.   Sterile Technique Usual hand hygeine, masks, and gloves were used   Procedure Description Patient positioned in bed supine.  Sedation given as noted above.  Patient was intubated with endotracheal tube using Glidescope.  View was Grade 1 full glottis .  Number of attempts was 1.  Colorimetric CO2 detector was consistent with tracheal placement.   Complications/Tolerance None; patient tolerated the procedure well. Chest X-ray is ordered to verify placement.   EBL Minimal   Specimen(s) None  JD Anselm Lis [facility] 04/25/2023, 3:20 AM  Please see [email or web address] for pager details.  From 7A-7P if no response, please call [phone]. After hours, please call ELink [phone].

## 2023-04-25 NOTE — Progress Notes (Signed)
An USGPIV (ultrasound guided PIV) has been placed for short-term vasopressor infusion. A correctly placed ivWatch must be used when administering vasopressors. Should this treatment be needed beyond 72 hours, central line access should be obtained.  It will be the responsibility of the bedside nurse to follow best practice to prevent extravasations.   

## 2023-04-26 DIAGNOSIS — N3001 Acute cystitis with hematuria: Secondary | ICD-10-CM | POA: Diagnosis not present

## 2023-04-26 DIAGNOSIS — E871 Hypo-osmolality and hyponatremia: Secondary | ICD-10-CM | POA: Diagnosis not present

## 2023-04-26 DIAGNOSIS — J9601 Acute respiratory failure with hypoxia: Secondary | ICD-10-CM | POA: Diagnosis not present

## 2023-04-26 LAB — GLUCOSE, CAPILLARY
Glucose-Capillary: 141 mg/dL — ABNORMAL HIGH (ref 70–99)
Glucose-Capillary: 171 mg/dL — ABNORMAL HIGH (ref 70–99)
Glucose-Capillary: 126 mg/dL — ABNORMAL HIGH (ref 70–99)
Glucose-Capillary: 143 mg/dL — ABNORMAL HIGH (ref 70–99)
Glucose-Capillary: 127 mg/dL — ABNORMAL HIGH (ref 70–99)
Glucose-Capillary: 137 mg/dL — ABNORMAL HIGH (ref 70–99)
Glucose-Capillary: 161 mg/dL — ABNORMAL HIGH (ref 70–99)
Glucose-Capillary: 153 mg/dL — ABNORMAL HIGH (ref 70–99)

## 2023-04-26 LAB — BASIC METABOLIC PANEL WITH GFR
Anion gap: 10 (ref 5–15)
BUN: 5 mg/dL — ABNORMAL LOW (ref 6–20)
CO2: 27 mmol/L (ref 22–32)
Calcium: 8.1 mg/dL — ABNORMAL LOW (ref 8.9–10.3)
Chloride: 96 mmol/L — ABNORMAL LOW (ref 98–111)
Creatinine, Ser: 0.68 mg/dL (ref 0.61–1.24)
GFR, Estimated: >60 mL/min (ref >60)
Glucose, Bld: 126 mg/dL — ABNORMAL HIGH (ref 70–99)
Potassium: 3.7 mmol/L (ref 3.5–5.1)
Sodium: 133 mmol/L — ABNORMAL LOW (ref 135–145)

## 2023-04-26 LAB — CBC
HCT: 23.8 % — ABNORMAL LOW (ref 39.0–52.0)
Hemoglobin: 8.4 g/dL — ABNORMAL LOW (ref 13.0–17.0)
MCH: 30.1 pg (ref 26.0–34.0)
MCHC: 35.3 g/dL (ref 30.0–36.0)
MCV: 85.3 fL (ref 80.0–100.0)
Platelets: 69 10*3/uL — ABNORMAL LOW (ref 150–400)
RBC: 2.79 MIL/uL — ABNORMAL LOW (ref 4.22–5.81)
RDW: 17.2 % — ABNORMAL HIGH (ref 11.5–15.5)
WBC: 29.3 10*3/uL — ABNORMAL HIGH (ref 4.0–10.5)
nRBC: 0.7 % — ABNORMAL HIGH (ref 0.0–0.2)

## 2023-04-26 LAB — MAGNESIUM: Magnesium: 2 mg/dL (ref 1.7–2.4)

## 2023-04-26 LAB — PHOSPHORUS: Phosphorus: 3 mg/dL (ref 2.5–4.6)

## 2023-04-26 MED ORDER — HYDROCORTISONE SOD SUC (PF) 100 MG IJ SOLR
50.0000 mg | Freq: Three times a day (TID) | INTRAMUSCULAR | Status: DC
  Administered 2023-04-26 – 2023-04-27 (×4): 50 mg via INTRAVENOUS
  Filled 2023-04-26 (×4): qty 2

## 2023-04-26 MED ORDER — FUROSEMIDE 10 MG/ML IJ SOLN
40.0000 mg | Freq: Once | INTRAMUSCULAR | Status: AC
  Administered 2023-04-26: 40 mg via INTRAVENOUS
  Filled 2023-04-26: qty 4

## 2023-04-26 MED ORDER — TRACE MINERALS CU-MN-SE-ZN 300-55-60-3000 MCG/ML IV SOLN
INTRAVENOUS | Status: AC
  Filled 2023-04-26: qty 738.4

## 2023-04-26 MED ORDER — POTASSIUM CHLORIDE 10 MEQ/50ML IV SOLN
10.0000 meq | INTRAVENOUS | Status: AC
  Administered 2023-04-26 (×5): 10 meq via INTRAVENOUS
  Filled 2023-04-26 (×5): qty 50

## 2023-04-26 NOTE — Progress Notes (Signed)
PT Cancellation Note  Patient Details Name: Brent Rogers MRN: [id number] DOB: [birth year]   Cancelled Treatment:    Reason Eval/Treat Not Completed: Patient not medically ready. Order written prior to medical decline and intubation. Will sign off. Please re-order if/when appropriate.   Angelina Ok [facility] 04/26/2023, 10:23 AM Skip Mayer PT Acute Rehabilitation Services Office [phone]

## 2023-04-26 NOTE — Progress Notes (Signed)
PHARMACY - TOTAL PARENTERAL NUTRITION CONSULT NOTE  Indication: Gastrocolic fistula   Patient Measurements: Height: 6\' 3"  (190.5 cm) Weight: 67.3 kg (148 lb 5.9 oz) IBW/kg (Calculated) : 84.5 TPN AdjBW (KG): 53.2 Body mass index is 18.54 kg/m. 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  Patient presented on 7/19 with N/V x 4 days along with LLQ pain.  Per documentation, CT showed thickening of the distal transverse colon, where there is a complex fistulous connection between the transverse colon to the gastrojejunal anastomosis and adjacent small bowel loops.  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  Pharmacy consulted to manage TPN.  Patient was started on CLD on 7/22 and reported fullness/abdominal pain after eating.  He reports not eating for a week PTA.  He does not weigh himself, but he knows he has lost a lot of weight; his pants size reduced from a 36 to a 28 over a 6 month time frame.  He typically eats 2 meals a day (lunch and dinner) and he eats protein, vegetables and carb; he rarely eats fruits. Pt emergently intubated 7/27 and made NPO status.  Glucose / Insulin: no hx DM - CBGs <180 with semglee 10u BID (32u SSI 7/27),  7/27 acutely hyperglycemic due to initiation of steroids, sugars controlled prior Electrolytes: Na 133 (max in TPN), K 3.7 (post x6 K runs, 30 mmol Kphos), Cl 96, Phos 3.0, Mag 2.0 (post 2g 7/27), iCa 1.09 on 7/27, CoCa 10.1 (post 2g 7/27) Renal: SCr < 1, BUN 5, received 120 furosemide on 7/27 Hepatic: 7/25 AST/ALT WNL, alk phos back wnl, tbili down to 0.9, albumin < 1.5, prealbumin < 5 on 7/24 Intake / Output; MIVF: UOP 1.1 ml/kg/hr, 7/26 emergent thoracentesis to - , LBM 7/26  GI Imaging:  7/25 CT Abd: severe fatty liver, thickened appearance of colon GI Surgeries / Procedures: none since TPN  initiation  Central access: PICC placed 04/21/23 TPN start date: 04/22/23  Nutritional Goals:  RD Estimated Needs Total Energy Estimated Needs: 1850-2150 kcals Total Protein Estimated Needs: 110-125 g Total Fluid Estimated Needs: 1.8 L  Current Nutrition:  NPO + TPN  Plan:  Continue concentrated TPN at 65 ml/hr (goal rate provides 110.8g AA, 1967 kCal, meeting 100% of needs) Electrolytes in TPN: Na 154 mEq/L, K 45 mEq/L (~ 70.2 mEq in bag), Ca 5 mEq/L, Mag 13 mEq/L, Phos 30 mmol/L, change Cl:Ac to 1:1 Add daily multivitamin and trace elements in TPN Continue semglee 10u BID with rSSI q4h Supplement K with x5 outside of TPN Remove thiamine, patient no longer refeeding F/u diuresis plan to account for K replacement outside of TPN Standard TPN labs on Mon/Thurs, daily PRN  Thank you for allowing pharmacy to be a part of this patient's care.  Rutherford Nail, PharmD PGY2 Critical Care Pharmacy Resident

## 2023-04-26 NOTE — Plan of Care (Signed)

## 2023-04-26 NOTE — Progress Notes (Signed)
OT Cancellation Note  Patient Details Name: Brent Rogers MRN: [id number] DOB: 02/17/1966   Cancelled Treatment:    Reason Eval/Treat Not Completed: Medical issues which prohibited therapy (pt remains intubated/sedated, OT will s/o. Please reconsult when appropriate.)  Carver Fila, OTD, OTR/L SecureChat Preferred Acute Rehab [phone]   Dalphine Handing 04/26/2023, 10:36 AM

## 2023-04-26 NOTE — Progress Notes (Signed)
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  Attempting conservative management and TPN to try to help nutritional status.  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  Denies any frank aspiration, breathing worsened after a BM.  CXR with worsening bilateral infiltrates. After discussion with rapid response and crosscover TRH, PCCM consulted to assist with management.  Patient states breathing is stable from this am, has not been able to sleep due to anxiety over SOB.  2022 echo normal  Pertinent  Medical History  Chronic pancreatitis Prior Whipple Severe protein calorie malnutrition Question of alcohol abuse but multiple previous notes state his use is modest at best.  I do see a level of 134 back in 2021. HTN  Significant Hospital Events: Including procedures, antibiotic start and stop dates in addition to other pertinent events   7/19 admit 04/24/2023 thoracentesis with 800 cc of fluid obtained  Interim History / Subjective:  Patient failed spinal breathing trial due to tachypnea and agitation Remained afebrile No overnight other issues   Objective   Blood pressure (!) 94/56, pulse 68, temperature 98.4 F (36.9 C), resp. rate (!) 25, height 6\' 3"  (1.905 m), weight 67.3 kg, SpO2 97%.    Vent Mode: PRVC FiO2 (%):  [40 %] 40 % Set Rate:  [25 bmp-26 bmp] 25 bmp Vt Set:  [620 mL] 620 mL PEEP:  [8 cmH20] 8 cmH20 Plateau Pressure:  [26 cmH20-28 cmH20] 26 cmH20   Intake/Output Summary (Last 24 hours) at 04/26/2023 0741 Last data filed at 04/26/2023 0600 Gross per 24 hour  Intake 4535.54 ml  Output 1780 ml   Net 2755.54 ml   Filed Weights   04/23/23 0646 04/25/23 0306 04/26/23 0500  Weight: 64.5 kg 64 kg 67.3 kg    Examination:   Physical exam: General: Crtitically ill-appearing cachectic male, orally intubated HEENT: [place]/AT, eyes anicteric.  ETT and OGT in place Neuro: Opens eyes with vocal stimuli, remains agitated and restless, trying to rip off the tube Chest: Tachypneic, coarse breath sounds, no wheezes or rhonchi Heart: Regular rate and rhythm, no murmurs or gallops Abdomen: Soft, nondistended, bowel sounds present Skin: No rash   Labs and images were reviewed  Resolved Hospital Problem list   N/A  Assessment & Plan:  Acute hypoxic respiratory failure Septic shock due to aspiration pneumonia Pleural effusion s/p thoracentesis Severe leukemoid reaction Acute colitis Acute urinary tract infection with Klebsiella Gastrocolic fistula, 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 Chronic severe protein calorie malnutrition on TPN Hyponatremia/hypokalemia/hypophosphatemia/hypomagnesemia/hypocalcemia Anemia of critical illness Thrombocytopenia Cachexia/severe protein calorie malnutrition  Continue lung protective ventilation VAP prevention bundle in place PAD protocol with Precedex and fentanyl Failed desponded breathing trial due to tachypnea and restlessness Came off of vasopressor support Shock is improved Continue IV antibiotics Decrease stress dose steroid to 50 mg every 8 hours Continue IV Lasix Stop vancomycin and micafungin Continue Zosyn Respiratory culture is negative Urine culture grew Klebsiella General surgery is following Continue TPN Monitor and supplement electrolytes Hypokalemia, hypomagnesemia and hypophosphatemia are corrected Monitor H&H and platelet count   Best Practice (right click and "Reselect all SmartList Selections" daily)   Diet/type: Tube  feeds DVT prophylaxis: Subcu anoxic GI prophylaxis: PPI Lines:  Central line Foley:  N/A Code Status:  full code Last date of multidisciplinary goals of care discussion: Pending  Labs   CBC: Recent Labs  Lab 04/23/23 2353 04/24/23 1519 04/25/23 0426 04/25/23 0438 04/25/23 0532 04/25/23 1731 04/26/23 0406  WBC 44.9* 32.0*  --  22.0*  --  28.6* 29.3*  NEUTROABS 40.4*  --   --   --   --   --   --   HGB 10.1* 9.2* 6.8* 6.5* 6.6* 8.6* 8.4*  HCT 28.5* 25.9* 20.0* 18.2* 18.9* 24.3* 23.8*  MCV 84.3 86.6  --  86.3  --  85.3 85.3  PLT 133* 104*  --  83*  --  75* 69*    Basic Metabolic Panel: Recent Labs  Lab 04/22/23 1450 04/23/23 0505 04/23/23 1507 04/23/23 2353 04/24/23 0858 04/25/23 0426 04/25/23 0438 04/26/23 0406  NA 130* 128* 128* 128* 130* 136 131* 133*  K 3.2* 4.1 6.0* 5.1 4.7 3.1* 3.3* 3.7  CL 104 100 101 100 99  --  97* 96*  CO2 18* 20* 19* 20* 21*  --  27 27  GLUCOSE 149* 109* 309* 169* 185*  --  171* 126*  BUN <5* <5* <5* <5* <5*  --  <5* 5*  CREATININE 0.52* 0.50* 0.50* 0.56* 0.70  --  0.53* 0.68  CALCIUM 6.4* 7.2* 6.7* 7.0* 7.4*  --  7.7* 8.1*  MG 1.8 1.8 2.6* 2.0 1.9  --  1.7 2.0  PHOS 1.6* 1.6*  --   --  2.8  --  2.0* 3.0   GFR: Estimated Creatinine Clearance: 97 mL/min (by C-G formula based on SCr of 0.68 mg/dL). Recent Labs  Lab 04/23/23 1922 04/23/23 2353 04/24/23 0352 04/24/23 0858 04/24/23 1519 04/25/23 0438 04/25/23 1731 04/26/23 0406  PROCALCITON  --   --   --  1.73  --   --   --   --   WBC  --    < >  --   --  32.0* 22.0* 28.6* 29.3*  LATICACIDVEN 3.9*  --  3.5* 4.1*  --   --   --   --    < > = values in this interval not displayed.    Liver Function Tests: Recent Labs  Lab 04/22/23 0355 04/23/23 0505 04/23/23 2353  AST 26 26 32  ALT 22 21 15   ALKPHOS 139* 142* 121  BILITOT 1.5* 1.3* 0.9  PROT 4.3* 4.5* 4.2*  ALBUMIN 1.6* 1.6* <1.5*   No results for input(s): "LIPASE", "AMYLASE" in the last 168 hours.  No results for input(s): "AMMONIA" in the last 168 hours.  ABG    Component Value  Date/Time   PHART 7.492 (H) 04/25/2023 0426   PCO2ART 33.5 04/25/2023 0426   PO2ART 283 (H) 04/25/2023 0426   HCO3 25.8 04/25/2023 0426   TCO2 27 04/25/2023 0426   ACIDBASEDEF 6.2 (H) 04/23/2023 1700   O2SAT 100 04/25/2023 0426     Coagulation Profile: Recent Labs  Lab 04/23/23 1922  INR 1.6*    Cardiac Enzymes: No results for input(s): "CKTOTAL", "CKMB", "CKMBINDEX", "TROPONINI" in the last 168 hours.  HbA1C: Hgb A1c MFr Bld  Date/Time Value Ref Range Status  09/04/2021 06:45 AM 5.3 4.8 - 5.6 % Final    Comment:    (NOTE) Pre diabetes:          5.7%-6.4%  Diabetes:              >  6.4%  Glycemic control for   <7.0% adults with diabetes   03/06/2021 08:18 PM 5.2 4.8 - 5.6 % Final    Comment:    (NOTE)         Prediabetes: 5.7 - 6.4         Diabetes: >6.4         Glycemic control for adults with diabetes: <7.0     CBG: Recent Labs  Lab 04/25/23 1202 04/25/23 1611 04/25/23 2002 04/25/23 2345 04/26/23 0409  GLUCAP 306* 175* 150* 171* 141*    The patient is critically ill due to acute respiratory failure with hypoxia/septic shock.  Critical care was necessary to treat or prevent imminent or life-threatening deterioration.  Critical care was time spent personally by me on the following activities: development of treatment plan with patient and/or surrogate as well as nursing, discussions with consultants, evaluation of patient's response to treatment, examination of patient, obtaining history from patient or surrogate, ordering and performing treatments and interventions, ordering and review of laboratory studies, ordering and review of radiographic studies, pulse oximetry, re-evaluation of patient's condition and participation in multidisciplinary rounds.   During this encounter critical care time was devoted to patient care services described in this note for 38 minutes.     Cheri Fowler, MD [facility] See Amion for pager If no response to  pager, please call [phone] until 7pm After 7pm, Please call E-link [phone]

## 2023-04-27 DIAGNOSIS — E871 Hypo-osmolality and hyponatremia: Secondary | ICD-10-CM | POA: Diagnosis not present

## 2023-04-27 LAB — POCT I-STAT 7, (LYTES, BLD GAS, ICA,H+H)
Acid-Base Excess: 8 mmol/L — ABNORMAL HIGH (ref 0.0–2.0)
Bicarbonate: 31 mmol/L — ABNORMAL HIGH (ref 20.0–28.0)
Calcium, Ion: 1.17 mmol/L (ref 1.15–1.40)
HCT: 28 % — ABNORMAL LOW (ref 39.0–52.0)
Hemoglobin: 9.5 g/dL — ABNORMAL LOW (ref 13.0–17.0)
O2 Saturation: 96 %
Patient temperature: 36.1
Potassium: 3.8 mmol/L (ref 3.5–5.1)
Sodium: 139 mmol/L (ref 135–145)
TCO2: 32 mmol/L (ref 22–32)
pCO2 arterial: 36.7 mmHg (ref 32–48)
pH, Arterial: 7.532 — ABNORMAL HIGH (ref 7.35–7.45)
pO2, Arterial: 70 mmHg — ABNORMAL LOW (ref 83–108)

## 2023-04-27 LAB — GLUCOSE, CAPILLARY
Glucose-Capillary: 122 mg/dL — ABNORMAL HIGH (ref 70–99)
Glucose-Capillary: 139 mg/dL — ABNORMAL HIGH (ref 70–99)
Glucose-Capillary: 140 mg/dL — ABNORMAL HIGH (ref 70–99)
Glucose-Capillary: 150 mg/dL — ABNORMAL HIGH (ref 70–99)
Glucose-Capillary: 154 mg/dL — ABNORMAL HIGH (ref 70–99)
Glucose-Capillary: 98 mg/dL (ref 70–99)

## 2023-04-27 LAB — TRIGLYCERIDES: Triglycerides: 103 mg/dL (ref <150)

## 2023-04-27 MED ORDER — TRACE MINERALS CU-MN-SE-ZN 300-55-60-3000 MCG/ML IV SOLN: INTRAVENOUS | Status: DC

## 2023-04-27 MED ORDER — TRACE MINERALS CU-MN-SE-ZN 300-55-60-3000 MCG/ML IV SOLN
INTRAVENOUS | Status: AC
  Filled 2023-04-27: qty 738.4

## 2023-04-27 MED ORDER — POTASSIUM CHLORIDE 10 MEQ/50ML IV SOLN
10.0000 meq | INTRAVENOUS | Status: AC
  Administered 2023-04-27 (×6): 10 meq via INTRAVENOUS
  Filled 2023-04-27 (×6): qty 50

## 2023-04-27 MED ORDER — FUROSEMIDE 10 MG/ML IJ SOLN
40.0000 mg | Freq: Once | INTRAMUSCULAR | Status: AC
  Administered 2023-04-27: 40 mg via INTRAVENOUS
  Filled 2023-04-27: qty 4

## 2023-04-27 MED ORDER — MELATONIN 5 MG PO TABS: 10.0000 mg | ORAL_TABLET | Freq: Every evening | ORAL | Status: DC | PRN

## 2023-04-27 MED ORDER — FENTANYL 2500MCG IN NS 250ML (10MCG/ML) PREMIX INFUSION
0.0000 ug/h | INTRAVENOUS | Status: DC
  Administered 2023-04-29: 175 ug/h via INTRAVENOUS
  Administered 2023-04-29: 100 ug/h via INTRAVENOUS
  Administered 2023-04-30: 175 ug/h via INTRAVENOUS
  Administered 2023-04-30 – 2023-05-01 (×2): 150 ug/h via INTRAVENOUS
  Filled 2023-04-27 (×5): qty 250

## 2023-04-27 MED ORDER — INSULIN GLARGINE-YFGN 100 UNIT/ML ~~LOC~~ SOLN
8.0000 [IU] | Freq: Two times a day (BID) | SUBCUTANEOUS | Status: DC
  Administered 2023-04-27 (×2): 8 [IU] via SUBCUTANEOUS
  Filled 2023-04-27 (×4): qty 0.08

## 2023-04-27 MED ORDER — GUAIFENESIN 200 MG PO TABS
200.0000 mg | ORAL_TABLET | Freq: Four times a day (QID) | ORAL | Status: DC
  Administered 2023-04-27: 200 mg
  Filled 2023-04-27 (×2): qty 1

## 2023-04-27 MED ORDER — FENTANYL CITRATE PF 50 MCG/ML IJ SOSY
50.0000 ug | PREFILLED_SYRINGE | INTRAMUSCULAR | Status: DC | PRN
  Administered 2023-04-27 (×2): 50 ug via INTRAVENOUS
  Administered 2023-04-29: 100 ug via INTRAVENOUS
  Administered 2023-05-03 (×2): 50 ug via INTRAVENOUS
  Filled 2023-04-27 (×6): qty 1

## 2023-04-27 MED ORDER — GUAIFENESIN 100 MG/5ML PO LIQD
10.0000 mL | Freq: Four times a day (QID) | ORAL | Status: DC | PRN
  Administered 2023-04-27: 10 mL
  Filled 2023-04-27: qty 10

## 2023-04-27 MED ORDER — GUAIFENESIN ER 600 MG PO TB12: 600.0000 mg | ORAL_TABLET | Freq: Two times a day (BID) | ORAL | Status: DC

## 2023-04-27 NOTE — Progress Notes (Signed)
Progress Note     Subjective: On the vent. RN reports responsive to pain but weaning sedation for SBT today. Low urine output overnight. Having bowel function, last BM was yesterday.   Objective: Vital signs in last 24 hours: Temp:  [95.9 F (35.5 C)-99 F (37.2 C)] 97 F (36.1 C) (07/29 0800) Pulse Rate:  [58-73] 58 (07/29 0800) Resp:  [17-25] 25 (07/29 0800) BP: (125-133)/(76-77) 133/77 (07/28 1520) SpO2:  [94 %-100 %] 99 % (07/29 0800) Arterial Line BP: (126-153)/(72-90) 149/86 (07/29 0800) FiO2 (%):  [40 %] 40 % (07/29 0752) Weight:  [69.9 kg] 69.9 kg (07/29 0500) Last BM Date : 04/24/23  Intake/Output from previous day: 07/28 0701 - 07/29 0700 In: 2417.3 [I.V.:2005.1; IV Piggyback:412.1] Out: 765 [Urine:765] Intake/Output this shift: Total I/O In: 90.2 [I.V.:77.7; IV Piggyback:12.5] Out: 4 [Urine:4]  PE: General: WD, cachectic male who is laying in bed sedated on the vent  Heart: sinus brady Lungs: vent, no rhonchi or wheezing on auscultation bilaterally  Abd: soft, NT, ND, BS hypoactive, midline surgical scar MS: edema in BLE and BUE.   Lab Results:  Recent Labs    04/26/23 0406 04/27/23 0350  WBC 29.3* 28.7*  HGB 8.4* 8.4*  HCT 23.8* 24.7*  PLT 69* 77*   BMET Recent Labs    04/26/23 0406 04/27/23 0350  NA 133* 137  K 3.7 3.6  CL 96* 99  CO2 27 30  GLUCOSE 126* 90  BUN 5* 12  CREATININE 0.68 0.85  CALCIUM 8.1* 8.0*   PT/INR No results for input(s): "LABPROT", "INR" in the last 72 hours. CMP     Component Value Date/Time   NA 137 04/27/2023 0350   NA 140 06/22/2020 1648   K 3.6 04/27/2023 0350   CL 99 04/27/2023 0350   CO2 30 04/27/2023 0350   GLUCOSE 90 04/27/2023 0350   BUN 12 04/27/2023 0350   BUN 4 (L) 06/22/2020 1648   CREATININE 0.85 04/27/2023 0350   CREATININE 0.91 10/15/2015 1517   CALCIUM 8.0 (L) 04/27/2023 0350   PROT 4.6 (L) 04/27/2023 0350   PROT 6.2 06/22/2020 1648   ALBUMIN 1.8 (L) 04/27/2023 0350   ALBUMIN 3.5  (L) 06/22/2020 1648   AST 31 04/27/2023 0350   ALT 15 04/27/2023 0350   ALKPHOS 143 (H) 04/27/2023 0350   BILITOT 0.8 04/27/2023 0350   BILITOT 0.2 06/22/2020 1648   GFRNONAA >60 04/27/2023 0350   GFRNONAA >89 10/15/2015 1517   GFRAA 128 06/22/2020 1648   GFRAA >89 10/15/2015 1517   Lipase     Component Value Date/Time   LIPASE 17 04/17/2023 1110       Studies/Results: No results found.  Anti-infectives: Anti-infectives (From admission, onward)    Start     Dose/Rate Route Frequency Ordered Stop   04/24/23 0300  micafungin (MYCAMINE) 100 mg in sodium chloride 0.9 % 100 mL IVPB  Status:  Discontinued        100 mg 105 mL/hr over 1 Hours Intravenous Daily 04/24/23 0108 04/26/23 0747   04/24/23 0200  vancomycin (VANCOCIN) IVPB 1000 mg/200 mL premix  Status:  Discontinued        1,000 mg 200 mL/hr over 60 Minutes Intravenous 2 times daily 04/24/23 0114 04/26/23 0747   04/23/23 1400  piperacillin-tazobactam (ZOSYN) IVPB 3.375 g        3.375 g 12.5 mL/hr over 240 Minutes Intravenous Every 8 hours 04/23/23 1138 04/30/23 1359   04/20/23 1200  cefTRIAXone (ROCEPHIN) 2 g  in sodium chloride 0.9 % 100 mL IVPB  Status:  Discontinued        2 g 200 mL/hr over 30 Minutes Intravenous Every 24 hours 04/20/23 1133 04/23/23 1138   04/18/23 1600  cefTRIAXone (ROCEPHIN) 2 g in sodium chloride 0.9 % 100 mL IVPB  Status:  Discontinued        2 g 200 mL/hr over 30 Minutes Intravenous Every 24 hours 04/17/23 1807 04/20/23 1133   04/18/23 0500  metroNIDAZOLE (FLAGYL) IVPB 500 mg  Status:  Discontinued        500 mg 100 mL/hr over 60 Minutes Intravenous Every 12 hours 04/17/23 1807 04/23/23 1138   04/17/23 1600  cefTRIAXone (ROCEPHIN) 1 g in sodium chloride 0.9 % 100 mL IVPB        1 g 200 mL/hr over 30 Minutes Intravenous  Once 04/17/23 1550 04/17/23 1738   04/17/23 1600  metroNIDAZOLE (FLAGYL) IVPB 500 mg        500 mg 100 mL/hr over 60 Minutes Intravenous  Once 04/17/23 1550 04/17/23 1747         Assessment/Plan  Hx of Whipple in 2015 Possible colitis  GJ-colonic fistula Severe protein calorie malnutrition  - on TPN and having some bowel function  - off pressors - will discuss possible trickle TF with attending MD - albumin 1.8 - GJ-colonic fistula will almost certainly not heal on its own, but nutrition needs to be better prior to surgical intervention and patient needs to be more medically stable prior to surgery  - could consider cycling TPN at night  - will continue to follow peripherally to assess surgical readiness and assist with planning/DC needs  - may need some diuresis but will defer to CCM  FEN: TPN  VTE: therapeutic LMWH ID: Zosyn  - per CCM -  Septic shock secondary to aspiration PNA - vent per CCM, off pressors  Pleural effusion s/p thora Severe leukemoid reaction  UTI with Klebsiella  Anemia of critical illness Thrombocytopenia - plts 77K  LOS: 9 days   I reviewed nursing notes, last 24 h vitals and pain scores, last 48 h intake and output, last 24 h labs and trends, and CCM notes .   Juliet Rude, [facility] 04/27/2023, 8:47 AM Please see Amion for pager number during day hours 7:00am-4:30pm

## 2023-04-27 NOTE — Progress Notes (Signed)
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  Attempting conservative management and TPN to try to help nutritional status.  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  Denies any frank aspiration, breathing worsened after a BM.  CXR with worsening bilateral infiltrates. After discussion with rapid response and crosscover TRH, PCCM consulted to assist with management.  Patient states breathing is stable from this am, has not been able to sleep due to anxiety over SOB.  2022 echo normal  Pertinent  Medical History  Chronic pancreatitis Prior Whipple Severe protein calorie malnutrition Question of alcohol abuse but multiple previous notes state his use is modest at best.  I do see a level of 134 back in 2021. HTN  Significant Hospital Events: Including procedures, antibiotic start and stop dates in addition to other pertinent events   7/19 admit 04/24/2023 thoracentesis with 800 cc of fluid obtained 7/29-tolerating SBT  Interim History / Subjective:  Weaned off sedation today remains calm.  Tolerating SBT.  Following commands.  Objective   Blood pressure 133/77, pulse 68, temperature (!) 96.1 F (35.6 C), resp. rate (!) 23, height 6\' 3"  (1.905 m), weight 69.9 kg, SpO2 97%.    Vent Mode: PSV;CPAP FiO2 (%):  [40 %] 40 % Set Rate:  [18 bmp-25 bmp] 18 bmp Vt Set:  [620 mL] 620 mL PEEP:  [5 cmH20-9 cmH20] 5 cmH20 Pressure Support:  [8 cmH20] 8 cmH20 Plateau Pressure:  [22 cmH20-25 cmH20] 23 cmH20   Intake/Output Summary (Last 24 hours) at 04/27/2023 1029 Last data filed at 04/27/2023 1022 Gross per 24 hour   Intake 2315.21 ml  Output 694 ml  Net 1621.21 ml   Filed Weights   04/25/23 0306 04/26/23 0500 04/27/23 0500  Weight: 64 kg 67.3 kg 69.9 kg    Examination:   Physical exam: General: Crtitically ill-appearing cachectic male, orally intubated HEENT: [place]/AT, eyes anicteric.  ETT and OGT in place Neuro: Follows commands and attempts to communicate.  No focal deficits. Chest: Chest clear to auscultation bilaterally. Heart: Regular rate and rhythm, no murmurs or gallops Abdomen: Soft, nondistended, bowel sounds present Skin: No rash  Ancillary tests personally reviewed  Normal electrolytes, normal creatinine Leukocytosis unchanged 28.7 Adequate glycemic control. Persistent thrombocytopenia 77 Assessment & Plan:  Acute hypoxic respiratory failure causing ARDS from aspiration. Septic shock due to aspiration pneumonia.  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  Nearly ready for extubation. -Minimize sedation.  If remains intubated can begin mobilization on ventilator. -Diurese today.  Negative fluid balance should facilitate extubation. -Complete 7-day course of Zosyn. -Can stop stress steroids as now off vasopressors. -Continue TPN -Continue to monitor CBC.   Best Practice (right click and "Reselect all SmartList Selections" daily)   Diet/type: TPN DVT prophylaxis: Subcu anoxic GI prophylaxis: PPI Lines: Central line Foley:  N/A Code Status:  full code Last date of multidisciplinary  goals of care discussion: Pending  CRITICAL  CARE Performed by: Lynnell Catalan   Total critical care time: 40 minutes  Critical care time was exclusive of separately billable procedures and treating other patients.  Critical care was necessary to treat or prevent imminent or life-threatening deterioration.  Critical care was time spent personally by me on the following activities: development of treatment plan with patient and/or surrogate as well as nursing, discussions with consultants, evaluation of patient's response to treatment, examination of patient, obtaining history from patient or surrogate, ordering and performing treatments and interventions, ordering and review of laboratory studies, ordering and review of radiographic studies, pulse oximetry, re-evaluation of patient's condition and participation in multidisciplinary rounds.  Lynnell Catalan, MD [facility] ICU Physician [facility]  Pager: [phone] Mobile: [phone] After hours: [phone].

## 2023-04-27 NOTE — Plan of Care (Signed)

## 2023-04-27 NOTE — Progress Notes (Signed)
eLink Physician-Brief Progress Note Patient Name: Brent Rogers DOB: 08-06-1966 MRN: [id number]   Date of Service  04/27/2023  HPI/Events of Note   oliguric 200 cc urine all shift  eICU Interventions  Continue observation.  Now intervention indicated at this time     Intervention Category Minor Interventions: Clinical assessment - ordering diagnostic tests  Zaila Crew 04/27/2023, 3:30 AM

## 2023-04-27 NOTE — Progress Notes (Signed)
PHARMACY - TOTAL PARENTERAL NUTRITION CONSULT NOTE  Indication: Gastrocolic fistula   Patient Measurements: Height: 6\' 3"  (190.5 cm) Weight: 69.9 kg (154 lb 1.6 oz) IBW/kg (Calculated) : 84.5 TPN AdjBW (KG): 53.2 Body mass index is 19.26 kg/m. 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  Patient presented on 7/19 with N/V x 4 days along with LLQ pain.  Per documentation, CT showed thickening of the distal transverse colon, where there is a complex fistulous connection between the transverse colon to the gastrojejunal anastomosis and adjacent small bowel loops.  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  Pharmacy consulted to manage TPN.  Patient was started on CLD on 7/22 and reported fullness/abdominal pain after eating.  He reports not eating for a week PTA.  He does not weigh himself, but he knows he has lost a lot of weight; his pants size reduced from a 36 to a 28 over a 6 month time frame.  He typically eats 2 meals a day (lunch and dinner) and he eats protein, vegetables and carb; he rarely eats fruits. Pt emergently intubated 7/27 and made NPO status.  Glucose / Insulin: no hx DM - CBGs <180 with glargine 10u BID (23u SSI 7/28) 7/27 acutely hyperglycemic due to initiation of steroids, sugars controlled prior 7/28 hydrocortisone taper decreased to 50mg  q8h Electrolytes: K 3.6 (post KCl 7/28), Phos 3.1, Mag 2.1, coCa 9.8, all others WNL Renal: SCr < 1, BUN 12 Hepatic: AST/ALT WNL, alk phos 143, tbili down to 0.8, TG 103, albumin 1.8, prealbumin < 5 on 7/24 Intake / Output; MIVF: UOP down to 0.5 ml/kg/hr - received 40mg  furosemide on 7/28,  LBM 7/28  GI Imaging:  7/25 CT Abd: severe fatty liver, thickened appearance of colon GI Surgeries / Procedures: none since TPN initiation  Central access: PICC placed 04/21/23 TPN start  date: 04/22/23  Nutritional Goals:  RD Estimated Needs Total Energy Estimated Needs: 1850-2150 kcals Total Protein Estimated Needs: 110-125 g Total Fluid Estimated Needs: 1.8 L  Current Nutrition:  NPO + TPN  Plan:  Continue concentrated TPN at 65 ml/hr (goal rate provides 110.8g AA, 1967 kCal, meeting 100% of needs) Electrolytes in TPN: Na 154 mEq/L, K increased to 55 mEq/L (~ 85.8 mEq in bag), Ca 5 mEq/L, Mag 13 mEq/L, Phos 30 mmol/L, change Cl:Ac to 1:1 Add daily multivitamin and trace elements in TPN Decrease insulin glargine to 8 units BID and continue rSSI q4h Monitor steroid plan and adjust insulin regimen accordingly Give additional KCl x6 outside of TPN to account for 40mg  of furosemide ordered on 7/29 Monitor diuresis plan to account for K replacement outside of TPN Standard TPN labs on Mon/Thurs, daily PRN Consider transitioning to cyclic TPN on 7/30  Thank you for allowing pharmacy to be a part of this patient's care.  Wilburn Cornelia, PharmD, BCPS Clinical Pharmacist 04/27/2023 8:26 AM   Please refer to AMION for pharmacy phone number

## 2023-04-28 ENCOUNTER — Inpatient Hospital Stay (HOSPITAL_COMMUNITY): Payer: BLUE CROSS/BLUE SHIELD

## 2023-04-28 ENCOUNTER — Other Ambulatory Visit (HOSPITAL_COMMUNITY): Payer: Self-pay

## 2023-04-28 DIAGNOSIS — E871 Hypo-osmolality and hyponatremia: Secondary | ICD-10-CM | POA: Diagnosis not present

## 2023-04-28 LAB — GLUCOSE, CAPILLARY
Glucose-Capillary: 145 mg/dL — ABNORMAL HIGH (ref 70–99)
Glucose-Capillary: 121 mg/dL — ABNORMAL HIGH (ref 70–99)
Glucose-Capillary: 107 mg/dL — ABNORMAL HIGH (ref 70–99)
Glucose-Capillary: 98 mg/dL (ref 70–99)
Glucose-Capillary: 124 mg/dL — ABNORMAL HIGH (ref 70–99)

## 2023-04-28 LAB — POCT I-STAT 7, (LYTES, BLD GAS, ICA,H+H)
Acid-Base Excess: 10 mmol/L — ABNORMAL HIGH (ref 0.0–2.0)
Bicarbonate: 33.6 mmol/L — ABNORMAL HIGH (ref 20.0–28.0)
Calcium, Ion: 1.13 mmol/L — ABNORMAL LOW (ref 1.15–1.40)
HCT: 27 % — ABNORMAL LOW (ref 39.0–52.0)
Hemoglobin: 9.2 g/dL — ABNORMAL LOW (ref 13.0–17.0)
O2 Saturation: 93 %
Patient temperature: 98.1
Potassium: 3.1 mmol/L — ABNORMAL LOW (ref 3.5–5.1)
Sodium: 144 mmol/L (ref 135–145)
TCO2: 35 mmol/L — ABNORMAL HIGH (ref 22–32)
pCO2 arterial: 39.3 mmHg (ref 32–48)
pH, Arterial: 7.539 — ABNORMAL HIGH (ref 7.35–7.45)
pO2, Arterial: 60 mmHg — ABNORMAL LOW (ref 83–108)

## 2023-04-28 LAB — CULTURE, RESPIRATORY W GRAM STAIN: Culture: NORMAL

## 2023-04-28 LAB — BASIC METABOLIC PANEL
Anion gap: 8 (ref 5–15)
BUN: 22 mg/dL — ABNORMAL HIGH (ref 6–20)
CO2: 31 mmol/L (ref 22–32)
Calcium: 7.8 mg/dL — ABNORMAL LOW (ref 8.9–10.3)
Chloride: 103 mmol/L (ref 98–111)
Creatinine, Ser: 0.62 mg/dL (ref 0.61–1.24)
GFR, Estimated: >60 mL/min (ref >60)
Glucose, Bld: 114 mg/dL — ABNORMAL HIGH (ref 70–99)
Potassium: 2.9 mmol/L — ABNORMAL LOW (ref 3.5–5.1)
Sodium: 142 mmol/L (ref 135–145)

## 2023-04-28 MED ORDER — MIDAZOLAM HCL 2 MG/2ML IJ SOLN
2.0000 mg | INTRAMUSCULAR | Status: DC | PRN
  Administered 2023-04-28: 2 mg via INTRAVENOUS
  Filled 2023-04-28: qty 2

## 2023-04-28 MED ORDER — HYDRALAZINE HCL 20 MG/ML IJ SOLN
10.0000 mg | INTRAMUSCULAR | Status: DC | PRN
  Administered 2023-04-28 – 2023-05-03 (×5): 10 mg via INTRAVENOUS
  Filled 2023-04-28 (×4): qty 1

## 2023-04-28 MED ORDER — POTASSIUM CHLORIDE 10 MEQ/50ML IV SOLN
10.0000 meq | INTRAVENOUS | Status: AC
  Administered 2023-04-28 – 2023-04-29 (×8): 10 meq via INTRAVENOUS
  Filled 2023-04-28 (×8): qty 50

## 2023-04-28 MED ORDER — POTASSIUM CHLORIDE 10 MEQ/50ML IV SOLN
10.0000 meq | INTRAVENOUS | Status: AC
  Administered 2023-04-28 (×4): 10 meq via INTRAVENOUS
  Filled 2023-04-28 (×3): qty 50

## 2023-04-28 MED ORDER — TRACE MINERALS CU-MN-SE-ZN 300-55-60-3000 MCG/ML IV SOLN
INTRAVENOUS | Status: AC
  Filled 2023-04-28: qty 738.4

## 2023-04-28 MED ORDER — QUETIAPINE FUMARATE 50 MG PO TABS: 50.0000 mg | ORAL_TABLET | Freq: Two times a day (BID) | ORAL | Status: DC

## 2023-04-28 MED ORDER — ORAL CARE MOUTH RINSE
15.0000 mL | OROMUCOSAL | Status: DC
  Administered 2023-04-28: 15 mL via OROMUCOSAL

## 2023-04-28 MED ORDER — PHENOL 1.4 % MT LIQD
1.0000 | OROMUCOSAL | Status: DC | PRN
  Administered 2023-04-28 – 2023-05-02 (×2): 1 via OROMUCOSAL
  Filled 2023-04-28: qty 177

## 2023-04-28 MED ORDER — INSULIN ASPART 100 UNIT/ML IJ SOLN: 0.0000 [IU] | Freq: Four times a day (QID) | INTRAMUSCULAR | Status: DC

## 2023-04-28 MED ORDER — ORAL CARE MOUTH RINSE: 15.0000 mL | OROMUCOSAL | Status: DC | PRN

## 2023-04-28 MED ORDER — FUROSEMIDE 10 MG/ML IJ SOLN
40.0000 mg | Freq: Once | INTRAMUSCULAR | Status: AC
  Administered 2023-04-28: 40 mg via INTRAVENOUS
  Filled 2023-04-28: qty 4

## 2023-04-28 NOTE — Progress Notes (Signed)
Nutrition Follow-up  DOCUMENTATION CODES:   Severe malnutrition in context of chronic illness, Underweight  INTERVENTION:   Continue TPN to meet 100% nutritional needs  If able to place small bore feeding tube past the GJ anastomosis, agree with trial of trickle TF. If this occurs, recommend Vital AF 1.2 at 20 ml/hr  NUTRITION DIAGNOSIS:   Severe Malnutrition related to chronic illness as evidenced by severe fat depletion, severe muscle depletion.  Ongoing but being addressed via TPN  GOAL:   Patient will meet greater than or equal to 90% of their needs  Being addressed via TPN  MONITOR:   PO intake, Diet advancement, Labs, Weight trends, Skin, I & O's  REASON FOR ASSESSMENT:   Consult New TPN/TNA  ASSESSMENT:   57 y.o. 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 Patient found to have  fistulous connections to GJ junction and small bowel  7/19 Admitted 7/22 Started on CL 7/23 PICC line placed 7/24 TPN initiated 7/25 CT abdomen: severe fatty liver, thickened appearance of colon, Ascites, diffuse mesenteric and subcutaneous edema and anasarca. 7/26 Emergent thoracentesis with 800 mL removed 7/27 Transferred to ICU early in AM, Intubated, ARDS 7/30 Self-Extubated  Extubated today, currently on HHFNC, OG removed with extubation  NPO Pt with GJ-colonic fistula with plans for possible surgery in the next 2-3 weeks  Reviewed surgery, noted no plans for TF proximal to GJ anastomosis  Noted plan to transition to Cyclic TPN by Pharmacist today. Noted insulin regimen being adjusted with change to Cyclic TPN. Cyclic TPN to infuse over 18 hours and provide 1986 kcals, 111 g of protein and 1564 mL  UOP 1.5 L in 24 hours; received lasix x 1 this AM  Unsure of current dry  weight has pt with moderate edema present on exam; weight up since admission but likely related to fluid gains  Labs: BUN/SCr wdl, sodium 139 (wdl), potassium 4.0 (wdl), phosphorus 3.1 (wdl), magnesium 2.1 (wdl) Meds: ss novolog  Diet Order:   Diet Order             Diet NPO time specified  Diet effective now                   EDUCATION NEEDS:   Education needs have been addressed  Skin:  Skin Assessment: Reviewed RN Assessment  Last BM:  7/26  Height:   Ht Readings from Last 1 Encounters:  04/18/23 6\' 3"  (1.905 m)    Weight:   Wt Readings from Last 1 Encounters:  04/28/23 68.5 kg    BMI:  Body mass index is 18.88 kg/m.  Estimated Nutritional Needs:   Kcal:  1850-2150 kcals  Protein:  110-125 g  Fluid:  1.8 L  [name] MS, RDN, LDN, CNSC Registered Dietitian 3 Clinical Nutrition RD Pager and On-Call Pager Number Located in [place]

## 2023-04-28 NOTE — TOC Progression Note (Signed)
Transition of Care [facility]) - Progression Note    Patient Details  Name: Brent Rogers MRN: [id number] Date of Birth: 1966-01-31  Transition of Care [facility]) CM/SW Contact  Graves-Bigelow, Lamar Laundry, RN Phone Number: 04/28/2023, 1:21 PM  Clinical Narrative: Patient was discussed in morning progression rounds. Per staff RN, patient continues on high flow oxygen and TPN. Case Manager will continue to follow for transition of care needs as the patient progresses.     Expected Discharge Plan: Home w Home Health Services Barriers to Discharge: Continued Medical Work up  Expected Discharge Plan and Services   Discharge Planning Services: CM Consult   Living arrangements for the past 2 months: Apartment  Social Determinants of Health (SDOH) Interventions SDOH Screenings   Alcohol Screen: Low Risk  (09/03/2021)  Depression (PHQ2-9): Medium Risk (03/21/2021)  Tobacco Use: High Risk (04/17/2023)   Readmission Risk Interventions    10/14/2021    2:38 PM  Readmission Risk Prevention Plan  Transportation Screening Complete  PCP or Specialist Appt within 3-5 Days Complete  HRI or Home Care Consult Complete  Social Work Consult for Recovery Care Planning/Counseling Complete  Palliative Care Screening Not Applicable  Medication Review Ocean[employer]) Complete

## 2023-04-28 NOTE — Progress Notes (Signed)
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  Attempting conservative management and TPN to try to help nutritional status.  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  Denies any frank aspiration, breathing worsened after a BM.  CXR with worsening bilateral infiltrates. After discussion with rapid response and crosscover TRH, PCCM consulted to assist with management.  Patient states breathing is stable from this am, has not been able to sleep due to anxiety over SOB.  2022 echo normal  Pertinent  Medical History  Chronic pancreatitis Prior Whipple Severe protein calorie malnutrition Question of alcohol abuse but multiple previous notes state his use is modest at best.  I do see a level of 134 back in 2021. HTN  Significant Hospital Events: Including procedures, antibiotic start and stop dates in addition to other pertinent events   7/19 admit 04/24/2023 thoracentesis with 800 cc of fluid obtained 7/29-tolerating SBT 7/30 self extubated during SBT.  Interim History / Subjective:  Self extubated after 45 minutes of successful SBT.  Patient complained of feeling short of breath but has settled on heated high flow oxygen.  Continues to have periods of agitation.  Objective   Blood pressure 101/84, pulse 80, temperature 100.2 F (37.9 C), temperature source Oral, resp. rate (!) 26, height 6\' 3"  (1.905 m), weight 68.5 kg, SpO2 95%. CVP:  [11 mmHg-15 mmHg] 11 mmHg  Vent Mode: PSV;CPAP FiO2 (%):  [40 %-60 %] 60 % Set Rate:  [18 bmp] 18 bmp Vt Set:  [620 mL] 620 mL PEEP:  [5 cmH20-8 cmH20] 5 cmH20 Pressure Support:   [8 cmH20] 8 cmH20 Plateau Pressure:  [16 cmH20-28 cmH20] 21 cmH20   Intake/Output Summary (Last 24 hours) at 04/28/2023 1049 Last data filed at 04/28/2023 1000 Gross per 24 hour  Intake 2623.45 ml  Output 1570 ml  Net 1053.45 ml   Filed Weights   04/26/23 0500 04/27/23 0500 04/28/23 0215  Weight: 67.3 kg 69.9 kg 68.5 kg    Examination:   Physical exam: General: Crtitically ill-appearing cachectic male, heated high flow oxygen HEENT: [place]/AT, eyes anicteric.  NG tube in place Neuro: Follows commands and attempts to communicate.  No focal deficits. Chest: Chest clear to auscultation bilaterally. Heart: Regular rate and rhythm, no murmurs or gallops Abdomen: Soft, nondistended, bowel sounds present Skin: No rash  Ancillary tests personally reviewed  Normal electrolytes, normal creatinine Leukocytosis decreased to 24.1 Adequate glycemic control. Improving thrombocytopenia to 84. Assessment & Plan:  Acute hypoxic respiratory failure causing ARDS from aspiration. Septic shock due to aspiration pneumonia.  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 -Continue Precedex to control agitation. -Diurese again today.  Negative fluid balance should facilitate extubation. -Complete 7-day course of Zosyn. -Mains off vasopressors -Continue TPN -Continue to monitor CBC.   Best Practice (right click and "Reselect all SmartList Selections" daily)   Diet/type:  TPN DVT prophylaxis: Subcu anoxic GI prophylaxis: PPI Lines:  Central line Foley:  N/A Code Status:  full code Last date of multidisciplinary goals of care discussion: Pending  CRITICAL CARE Performed by: Lynnell Catalan   Total critical care time: 40 minutes  Critical care time was exclusive of separately billable procedures and treating other patients.  Critical care was necessary to treat or prevent imminent or life-threatening deterioration.  Critical care was time spent personally by me on the following activities: development of treatment plan with patient and/or surrogate as well as nursing, discussions with consultants, evaluation of patient's response to treatment, examination of patient, obtaining history from patient or surrogate, ordering and performing treatments and interventions, ordering and review of laboratory studies, ordering and review of radiographic studies, pulse oximetry, re-evaluation of patient's condition and participation in multidisciplinary rounds.  Lynnell Catalan, MD [facility] ICU Physician [facility] [place] [facility]  Pager: [phone] Mobile: [phone] After hours: [phone].

## 2023-04-28 NOTE — Progress Notes (Signed)
[facility] ADULT ICU REPLACEMENT PROTOCOL   The patient does apply for the [facility] Adult ICU Electrolyte Replacment Protocol based on the criteria listed below:   1.Exclusion criteria: TCTS, ECMO, Dialysis, and Myasthenia Gravis patients 2. Is GFR >/= 30 ml/min? Yes.    Patient's GFR today is >60  3. Is SCr </= 2? Yes.   Patient's SCr is 0.62 mg/dL 4. Did SCr increase >/= 0.5 in 24 hours? No. 5.Pt's weight >40kg  Yes.   6. Abnormal electrolyte(s): K 2.9  7. Electrolytes replaced per protocol 8.  Call MD STAT for K+ </= 2.5, Phos </= 1, or Mag </= 1 Physician:  Mauri Pole, Steele Stracener A 04/28/2023 8:45 PM

## 2023-04-28 NOTE — Progress Notes (Addendum)
eLink Physician-Brief Progress Note Patient Name: Brent Rogers DOB: Aug 28, 1966 MRN: [id number]   Date of Service  04/28/2023  HPI/Events of Note  Hypertensive to the 150s systolic  eICU Interventions  As needed hydralazine for systolic greater than 160.   0517 -ongoing agitation, following commands, thrashing around despite being on Precedex and fentanyl.  Add on as needed Versed to maintain RASS of 0 to -1.  Intervention Category Intermediate Interventions: Hypertension - evaluation and management  Jurline Folger 04/28/2023, 3:57 AM

## 2023-04-28 NOTE — Plan of Care (Signed)
  Problem: Nutrition: Goal: Adequate nutrition will be maintained Outcome: Progressing   Problem: Coping: Goal: Level of anxiety will decrease Outcome: Progressing   Problem: Pain Managment: Goal: General experience of comfort will improve Outcome: Progressing   Problem: Safety: Goal: Ability to remain free from injury will improve Outcome: Progressing   Problem: Skin Integrity: Goal: Risk for impaired skin integrity will decrease Outcome: Progressing   Problem: Safety: Goal: Non-violent Restraint(s) Outcome: Progressing

## 2023-04-28 NOTE — Progress Notes (Signed)
PHARMACY - TOTAL PARENTERAL NUTRITION CONSULT NOTE  Indication: Gastrocolic fistula   Patient Measurements: Height: 6\' 3"  (190.5 cm) Weight: 68.5 kg (151 lb 0.2 oz) IBW/kg (Calculated) : 84.5 TPN AdjBW (KG): 53.2 Body mass index is 18.88 kg/m. 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  Patient presented on 7/19 with N/V x 4 days along with LLQ pain.  Per documentation, CT showed thickening of the distal transverse colon, where there is a complex fistulous connection between the transverse colon to the gastrojejunal anastomosis and adjacent small bowel loops.  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  Pharmacy consulted to manage TPN.  Patient was started on CLD on 7/22 and reported fullness/abdominal pain after eating.  He reports not eating for a week PTA.  He does not weigh himself, but he knows he has lost a lot of weight; his pants size reduced from a 36 to a 28 over a 6 month time frame.  He typically eats 2 meals a day (lunch and dinner) and he eats protein, vegetables and carb; he rarely eats fruits. Pt emergently intubated 7/27 and made NPO status.  Glucose / Insulin: no hx DM - CBGs <180 with glargine 8u BID (20u SSI 7/29) 7/27 acutely hyperglycemic due to initiation of steroids, sugars controlled prior 7/28 hydrocortisone taper decreased to 50mg  q8h 7/29 hydrocortisone discontinued Electrolytes: K 4 (post KCl 7/29), coCa 9.9, all others WNL Renal: SCr < 1, BUN 18 Hepatic: 7/28 AST/ALT WNL, alk phos 143, tbili down to 0.8, TG 103, albumin 1.8, 7/24 prealbumin < 5 Intake / Output; MIVF: UOP 0.9 ml/kg/hr - received 40mg  furosemide on 7/29,  LBM 7/28  GI Imaging:  7/25 CT Abd: severe fatty liver, thickened appearance of colon GI Surgeries / Procedures: none since TPN initiation  Central access: PICC placed 04/21/23 TPN  start date: 04/22/23  Nutritional Goals:  RD Estimated Needs Total Energy Estimated Needs: 1850-2150 kcals Total Protein Estimated Needs: 110-125 g Total Fluid Estimated Needs: 1.8 L  Current Nutrition:  NPO + TPN  Plan:  Change to cyclic concentrated TPN at 1800  Start TPN infusion rate at 36ml/hr at 1800, increase infusion rate to 30ml/hr at 1900 and continue for 16 hours, then decrease infusion rate to 66ml/hr at 1100 and continue for 1 hour, then stop TPN. TPN to provide 111g AA and 1968 kCal, meeting 100% of nutritional needs. Glucose infusion rate: 2.01-4.0 mg/kg/min  Electrolytes in TPN: Na 154 mEq/L, K increased to 55 mEq/L (~ 85.8 mEq in bag), Ca 5 mEq/L, Mag 13 mEq/L, Phos 30 mmol/L, Cl:Ac to 1:1 Add daily multivitamin and trace elements in TPN Add regular insulin 14 units to TPN Discontinue insulin glargine and change rSSI to QID and CBG checks at 0800/1300/1700/2100 Give additional KCl x4 outside of TPN to account for furosemide 40mg  IV x1 ordered on 7/30 Monitor diuresis plan to account for K replacement outside of TPN (patient has required ~39mEq of KCl for every 40mg  of IV furosemide) Standard TPN labs daily until stable on cyclic TPN, then every Mon/Thurs   Thank you for allowing pharmacy to be a part of this patient's care.  Wilburn Cornelia, PharmD, BCPS Clinical Pharmacist 04/28/2023 7:16 AM   Please refer to [facility] for pharmacy phone number

## 2023-04-28 NOTE — TOC Benefit Eligibility Note (Signed)
Pharmacy Patient Advocate Encounter  Insurance verification completed.    The patient is insured through [employer]   Ran test claim for Eliquis. Currently a quantity of 60 is a 30 day supply and the co-pay is $4.00 .   Ran test claim for Xarelto. Currently a quantity of 30 is a 30 day supply and the co-pay is $4.00 .   This test claim was processed through [facility]- copay amounts may vary at other pharmacies due to pharmacy/plan contracts, or as the patient moves through the different stages of their insurance plan.

## 2023-04-29 ENCOUNTER — Inpatient Hospital Stay (HOSPITAL_COMMUNITY): Payer: BLUE CROSS/BLUE SHIELD

## 2023-04-29 DIAGNOSIS — J80 Acute respiratory distress syndrome: Secondary | ICD-10-CM | POA: Diagnosis not present

## 2023-04-29 LAB — POCT I-STAT 7, (LYTES, BLD GAS, ICA,H+H)
Acid-Base Excess: 12 mmol/L — ABNORMAL HIGH (ref 0.0–2.0)
Acid-Base Excess: 8 mmol/L — ABNORMAL HIGH (ref 0.0–2.0)
Acid-Base Excess: 9 mmol/L — ABNORMAL HIGH (ref 0.0–2.0)
Bicarbonate: 34.1 mmol/L — ABNORMAL HIGH (ref 20.0–28.0)
Bicarbonate: 37.2 mmol/L — ABNORMAL HIGH (ref 20.0–28.0)
Bicarbonate: 35.8 mmol/L — ABNORMAL HIGH (ref 20.0–28.0)
Calcium, Ion: 1.12 mmol/L — ABNORMAL LOW (ref 1.15–1.40)
Calcium, Ion: 1.18 mmol/L (ref 1.15–1.40)
Calcium, Ion: 1.15 mmol/L (ref 1.15–1.40)
HCT: 29 % — ABNORMAL LOW (ref 39.0–52.0)
HCT: 31 % — ABNORMAL LOW (ref 39.0–52.0)
HCT: 27 % — ABNORMAL LOW (ref 39.0–52.0)
Hemoglobin: 10.5 g/dL — ABNORMAL LOW (ref 13.0–17.0)
Hemoglobin: 9.9 g/dL — ABNORMAL LOW (ref 13.0–17.0)
Hemoglobin: 9.2 g/dL — ABNORMAL LOW (ref 13.0–17.0)
O2 Saturation: 100 %
O2 Saturation: 92 %
O2 Saturation: 99 %
Patient temperature: 98.4
Patient temperature: 98.4
Patient temperature: 97.6
Potassium: 3.4 mmol/L — ABNORMAL LOW (ref 3.5–5.1)
Potassium: 3.6 mmol/L (ref 3.5–5.1)
Potassium: 3.5 mmol/L (ref 3.5–5.1)
Sodium: 146 mmol/L — ABNORMAL HIGH (ref 135–145)
Sodium: 147 mmol/L — ABNORMAL HIGH (ref 135–145)
Sodium: 146 mmol/L — ABNORMAL HIGH (ref 135–145)
TCO2: 35 mmol/L — ABNORMAL HIGH (ref 22–32)
TCO2: 40 mmol/L — ABNORMAL HIGH (ref 22–32)
TCO2: 38 mmol/L — ABNORMAL HIGH (ref 22–32)
pCO2 arterial: 32.6 mmHg (ref 32–48)
pCO2 arterial: 83.3 mmHg (ref 32–48)
pCO2 arterial: 58.9 mmHg — ABNORMAL HIGH (ref 32–48)
pH, Arterial: 7.258 — ABNORMAL LOW (ref 7.35–7.45)
pH, Arterial: 7.627 (ref 7.35–7.45)
pH, Arterial: 7.39 (ref 7.35–7.45)
pO2, Arterial: 363 mmHg — ABNORMAL HIGH (ref 83–108)
pO2, Arterial: 50 mmHg — ABNORMAL LOW (ref 83–108)
pO2, Arterial: 165 mmHg — ABNORMAL HIGH (ref 83–108)

## 2023-04-29 LAB — BASIC METABOLIC PANEL
Anion gap: 8 (ref 5–15)
Anion gap: 7 (ref 5–15)
BUN: 26 mg/dL — ABNORMAL HIGH (ref 6–20)
BUN: 26 mg/dL — ABNORMAL HIGH (ref 6–20)
CO2: 34 mmol/L — ABNORMAL HIGH (ref 22–32)
CO2: 34 mmol/L — ABNORMAL HIGH (ref 22–32)
Calcium: 7.9 mg/dL — ABNORMAL LOW (ref 8.9–10.3)
Calcium: 8.1 mg/dL — ABNORMAL LOW (ref 8.9–10.3)
Chloride: 105 mmol/L (ref 98–111)
Chloride: 106 mmol/L (ref 98–111)
Creatinine, Ser: 0.57 mg/dL — ABNORMAL LOW (ref 0.61–1.24)
Creatinine, Ser: 0.58 mg/dL — ABNORMAL LOW (ref 0.61–1.24)
GFR, Estimated: >60 mL/min (ref >60)
GFR, Estimated: >60 mL/min (ref >60)
Glucose, Bld: 119 mg/dL — ABNORMAL HIGH (ref 70–99)
Glucose, Bld: 95 mg/dL (ref 70–99)
Potassium: 3.5 mmol/L (ref 3.5–5.1)
Potassium: 4.2 mmol/L (ref 3.5–5.1)
Sodium: 147 mmol/L — ABNORMAL HIGH (ref 135–145)
Sodium: 147 mmol/L — ABNORMAL HIGH (ref 135–145)

## 2023-04-29 LAB — TYPE AND SCREEN
ABO/RH(D): O POS
Antibody Screen: NEGATIVE
Unit division: 0
Unit division: 0

## 2023-04-29 LAB — BPAM RBC
Blood Product Expiration Date: 202408252359
Blood Product Expiration Date: 202408282359
ISSUE DATE / TIME: 202407270828
Unit Type and Rh: 5100
Unit Type and Rh: 5100

## 2023-04-29 LAB — GLUCOSE, CAPILLARY
Glucose-Capillary: 201 mg/dL — ABNORMAL HIGH (ref 70–99)
Glucose-Capillary: 198 mg/dL — ABNORMAL HIGH (ref 70–99)
Glucose-Capillary: 147 mg/dL — ABNORMAL HIGH (ref 70–99)
Glucose-Capillary: 138 mg/dL — ABNORMAL HIGH (ref 70–99)
Glucose-Capillary: 108 mg/dL — ABNORMAL HIGH (ref 70–99)
Glucose-Capillary: 149 mg/dL — ABNORMAL HIGH (ref 70–99)
Glucose-Capillary: 22 mg/dL — CL (ref 70–99)
Glucose-Capillary: 28 mg/dL — CL (ref 70–99)
Glucose-Capillary: 31 mg/dL — CL (ref 70–99)
Glucose-Capillary: 89 mg/dL (ref 70–99)
Glucose-Capillary: 83 mg/dL (ref 70–99)
Glucose-Capillary: 81 mg/dL (ref 70–99)

## 2023-04-29 MED ORDER — FENTANYL CITRATE PF 50 MCG/ML IJ SOSY
50.0000 ug | PREFILLED_SYRINGE | INTRAMUSCULAR | Status: DC | PRN
  Administered 2023-04-29: 50 ug via INTRAVENOUS

## 2023-04-29 MED ORDER — PROPOFOL 1000 MG/100ML IV EMUL
0.0000 ug/kg/min | INTRAVENOUS | Status: DC
  Administered 2023-04-29: 35 ug/kg/min via INTRAVENOUS
  Administered 2023-04-29 (×2): 40 ug/kg/min via INTRAVENOUS
  Administered 2023-04-29 – 2023-04-30 (×5): 35 ug/kg/min via INTRAVENOUS
  Administered 2023-05-01: 30 ug/kg/min via INTRAVENOUS
  Administered 2023-05-01: 35 ug/kg/min via INTRAVENOUS
  Filled 2023-04-29 (×3): qty 100
  Filled 2023-04-29: qty 200
  Filled 2023-04-29 (×5): qty 100

## 2023-04-29 MED ORDER — FUROSEMIDE 10 MG/ML IJ SOLN
40.0000 mg | Freq: Two times a day (BID) | INTRAMUSCULAR | Status: DC
  Administered 2023-04-29 – 2023-04-30 (×3): 40 mg via INTRAVENOUS
  Filled 2023-04-29 (×3): qty 4

## 2023-04-29 MED ORDER — POTASSIUM CHLORIDE 10 MEQ/50ML IV SOLN: 10.0000 meq | INTRAVENOUS | Status: DC

## 2023-04-29 MED ORDER — ORAL CARE MOUTH RINSE
15.0000 mL | OROMUCOSAL | Status: DC
  Administered 2023-04-29: 15 mL via OROMUCOSAL

## 2023-04-29 MED ORDER — TRACE MINERALS CU-MN-SE-ZN 300-55-60-3000 MCG/ML IV SOLN
INTRAVENOUS | Status: DC
  Filled 2023-04-29: qty 738.4

## 2023-04-29 MED ORDER — ETOMIDATE 2 MG/ML IV SOLN
20.0000 mg | Freq: Once | INTRAVENOUS | Status: AC
  Administered 2023-04-29: 20 mg via INTRAVENOUS

## 2023-04-29 MED ORDER — ORAL CARE MOUTH RINSE: 15.0000 mL | OROMUCOSAL | Status: DC | PRN

## 2023-04-29 MED ORDER — ROCURONIUM BROMIDE 10 MG/ML (PF) SYRINGE: 70.0000 mg | PREFILLED_SYRINGE | Freq: Once | INTRAVENOUS | Status: AC

## 2023-04-29 MED ORDER — DEXTROSE 50 % IV SOLN: 25.0000 g | INTRAVENOUS | Status: AC

## 2023-04-29 MED ORDER — ETOMIDATE 2 MG/ML IV SOLN
INTRAVENOUS | Status: AC
  Filled 2023-04-29: qty 10

## 2023-04-29 MED ORDER — DEXTROSE 10 % IV SOLN: Freq: Every day | INTRAVENOUS | Status: DC | PRN

## 2023-04-29 MED ORDER — ROCURONIUM BROMIDE 10 MG/ML (PF) SYRINGE
PREFILLED_SYRINGE | INTRAVENOUS | Status: AC
  Administered 2023-04-29: 70 mg via INTRAVENOUS
  Filled 2023-04-29: qty 10

## 2023-04-29 MED ORDER — POTASSIUM CHLORIDE 10 MEQ/50ML IV SOLN
10.0000 meq | INTRAVENOUS | Status: AC
  Administered 2023-04-29 (×2): 10 meq via INTRAVENOUS
  Filled 2023-04-29 (×2): qty 50

## 2023-04-29 MED ORDER — SODIUM BICARBONATE 8.4 % IV SOLN
50.0000 meq | Freq: Once | INTRAVENOUS | Status: AC
  Administered 2023-04-29: 50 meq via INTRAVENOUS

## 2023-04-29 MED ORDER — PROPOFOL 1000 MG/100ML IV EMUL
INTRAVENOUS | Status: AC
  Administered 2023-04-29: 20 ug/kg/min via INTRAVENOUS
  Filled 2023-04-29: qty 100

## 2023-04-29 MED ORDER — DEXTROSE 50 % IV SOLN
INTRAVENOUS | Status: AC
  Administered 2023-04-29: 25 g via INTRAVENOUS
  Filled 2023-04-29: qty 50

## 2023-04-29 MED ORDER — POLYETHYLENE GLYCOL 3350 17 G PO PACK: 17.0000 g | PACK | Freq: Every day | ORAL | Status: DC

## 2023-04-29 MED ORDER — DOCUSATE SODIUM 50 MG/5ML PO LIQD: 100.0000 mg | Freq: Two times a day (BID) | ORAL | Status: DC

## 2023-04-29 MED ORDER — INSULIN ASPART 100 UNIT/ML IJ SOLN
1.0000 [IU] | INTRAMUSCULAR | Status: DC
  Administered 2023-04-30: 1 [IU] via SUBCUTANEOUS
  Administered 2023-05-01 – 2023-05-02 (×3): 2 [IU] via SUBCUTANEOUS

## 2023-04-29 MED ORDER — FENTANYL CITRATE PF 50 MCG/ML IJ SOSY
50.0000 ug | PREFILLED_SYRINGE | INTRAMUSCULAR | Status: DC | PRN
  Administered 2023-04-30 – 2023-05-01 (×4): 50 ug via INTRAVENOUS

## 2023-04-29 MED ORDER — MIDAZOLAM HCL 2 MG/2ML IJ SOLN
1.0000 mg | INTRAMUSCULAR | Status: DC | PRN
  Administered 2023-05-01: 1 mg via INTRAVENOUS
  Filled 2023-04-29: qty 2

## 2023-04-29 MED ORDER — INSULIN ASPART 100 UNIT/ML IJ SOLN
0.0000 [IU] | INTRAMUSCULAR | Status: DC
  Administered 2023-04-29 (×2): 3 [IU] via SUBCUTANEOUS

## 2023-04-29 MED ORDER — POTASSIUM CHLORIDE 10 MEQ/50ML IV SOLN
10.0000 meq | INTRAVENOUS | Status: AC
  Administered 2023-04-29 (×6): 10 meq via INTRAVENOUS
  Filled 2023-04-29 (×6): qty 50

## 2023-04-29 MED ORDER — DEXTROSE 10 % IV SOLN: INTRAVENOUS | Status: DC

## 2023-04-29 MED ORDER — ORAL CARE MOUTH RINSE
15.0000 mL | OROMUCOSAL | Status: DC
  Administered 2023-04-29 – 2023-05-02 (×41): 15 mL via OROMUCOSAL

## 2023-04-29 MED ORDER — SODIUM BICARBONATE 8.4 % IV SOLN
INTRAVENOUS | Status: AC
  Filled 2023-04-29: qty 50

## 2023-04-29 NOTE — Progress Notes (Signed)
PHARMACY - TOTAL PARENTERAL NUTRITION CONSULT NOTE  Indication: Gastrocolic fistula   Patient Measurements: Height: 6\' 3"  (190.5 cm) Weight: 68.8 kg (151 lb 10.8 oz) IBW/kg (Calculated) : 84.5 TPN AdjBW (KG): 53.2 Body mass index is 18.96 kg/m. 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  Patient presented on 7/19 with N/V x 4 days along with LLQ pain.  Per documentation, CT showed thickening of the distal transverse colon, where there is a complex fistulous connection between the transverse colon to the gastrojejunal anastomosis and adjacent small bowel loops.  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  Pharmacy consulted to manage TPN.  Patient was started on CLD on 7/22 and reported fullness/abdominal pain after eating.  He reports not eating for a week PTA.  He does not weigh himself, but he knows he has lost a lot of weight; his pants size reduced from a 36 to a 28 over a 6 month time frame.  He typically eats 2 meals a day (lunch and dinner) and he eats protein, vegetables and carb; he rarely eats fruits. Pt emergently intubated 7/27 and made NPO status. Pt self-extubated on 7/30 AM, but required emergent reintubation on 7/31 around midnight.  Glucose / Insulin: no hx DM - CBGs 114-203 with 14 units of insulin in TPN - no SSI given 7/27-7/29 patient was initiated on insulin glargine for steroid-induced hyperglycemia, glargine d/c'd on 7/30 Electrolytes: Na 146, K 3.5 (post KCl in addition to TPN), CO2 33, coCa 9.6, all others WNL Renal: SCr < 1, BUN trending up to 23 Hepatic: 7/28 AST/ALT WNL, alk phos 143, tbili down to 0.8, TG 103, albumin 1.8, 7/24 prealbumin < 5 Intake / Output; MIVF: UOP 1.1 ml/kg/hr - received 40mg  furosemide on 30,  LBM 7/26  GI Imaging:  7/25 CT Abd: severe fatty liver, thickened appearance of colon GI  Surgeries / Procedures: none since TPN initiation  Central access: PICC placed 04/21/23 TPN start date: 04/22/23  Nutritional Goals:  RD Estimated Needs Total Energy Estimated Needs: 1850-2150 kcals Total Protein Estimated Needs: 110-125 g Total Fluid Estimated Needs: 1.8 L  Current Nutrition:  NPO + TPN  Plan:  Change back to concentrated continuous TPN at 1800  TPN to provide 111g AA and 1968 kCal, meeting 100% of nutritional needs. Glucose infusion rate: 2.01-4.0 mg/kg/min  Electrolytes in TPN: Na decreased to 144 mEq/L, K increased to 67 mEq/L (~ 105 mEq in bag), Ca 5 mEq/L, Mag decreased to 10 mEq/L, Phos 30 mmol/L, Cl:Ac to 1:1 Add daily multivitamin and trace elements in TPN Increase regular insulin to 18 units added to TPN Change rSSI and CBG checks to q6h Give additional KCl x6 outside of TPN Monitor diuresis plan to account for K replacement outside of TPN (patient has required ~56mEq of KCl for every 40mg  of IV furosemide) Recheck BMP at 18:30 to determine need for additional K supplementation - note furosemide dose increased to 40mg  IV BID and patient will likely need at least an additional of KCl in addition to any other supplementation  Standard TPN labs daily until stable, then every Mon/Thurs   Thank you for allowing pharmacy to be a part of this patient's care.  Wilburn Cornelia, PharmD, BCPS Clinical Pharmacist 04/29/2023 9:55 AM   Please refer to [facility] for pharmacy phone number

## 2023-04-29 NOTE — Progress Notes (Signed)
eLink Physician-Brief Progress Note Patient Name: Brent Rogers DOB: March 03, 1966 MRN: [id number]   Date of Service  04/29/2023  HPI/Events of Note  CXR reviewed.  eICU Interventions  OG tube in good position, order entered to advance ET tube 3 cm.        [name] 04/29/2023, 2:26 AM

## 2023-04-29 NOTE — Procedures (Signed)
Intubation Procedure Note  Brent Rogers  [id number]  [birth year]  Date:04/29/23  Time:1:08 AM   Provider Performing:Tauren Delbuono    Procedure: Intubation (31500)  Indication(s) Respiratory Failure - aRDS self extubated earlier and now in respiratory distress.     Variable  0 Points 1 Point 2 Points Total  Heart rate per minute  <90 beats 90-109 beats >110 beats 2  Respiratory  Rate per minute < 18 breaths 19-30 breaths  >30 breaths 2  Restlessness; nonpurposeful movements None  occas slight movement Frequent movement 2  Paradoxical breathing pattern: None  Present 1  Accessory muscle use: rise in clavicle during inspiration None Slight rise Pronounced rise 2  Grunting at end-expiration: guttural sound None  Present 2  Nasal flaring: involuntary movement of nares None  Present 2  Look of fear None  Eyes wide 2  Overall total out of 16    15   Consent Unable to obtain consent due to emergent nature of procedure.   Anesthesia Etomidate, Versed, Fentanyl, Rocuronium, and Bic bolus   Time Out Verified patient identification, verified procedure, site/side was marked, verified correct patient position, special equipment/implants available, medications/allergies/relevant history reviewed, required imaging and test results available.   Sterile Technique Usual hand hygeine, masks, and gloves were used   Procedure Description Patient positioned in bed supine.  Sedation given as noted above.  Patient was intubated with endotracheal tube using Glidescope.  View was Grade 1 full glottis .  Number of attempts was 1.  Colorimetric CO2 detector was consistent with tracheal placement.   Complications/Tolerance None; patient tolerated the procedure well. Chest X-ray is ordered to verify placement.   EBL NONE   Specimen(s) None  Post intbuation  - ARDS protocol - dc precedex (done pre intubation) - PAD protocol - fent prn + diprivan gtt  - track ABG    SIGNATURE     Dr. Kalman Shan, M.D., F.C.C.P,  Pulmonary and Critical Care Medicine Staff Physician, [facility] Center Director - Interstitial Lung Disease  Program  Pulmonary Fibrosis [facility] [place], [place], [address]   Pager: [phone], If no answer  -> Check AMION or Try [phone] Telephone (clinical office): [phone] Telephone (research): [phone]  1:09 AM 04/29/2023

## 2023-04-29 NOTE — Progress Notes (Signed)
Nutrition Follow-up  DOCUMENTATION CODES:   Severe malnutrition in context of chronic illness, Underweight  INTERVENTION:   - Continue TPN to meet 100% of nutritional needs  If able to place small-bore feeding tube distal to GJ anastomosis, agree with trial of trickle tube feeds. Recommend: - Vital AF 1.2 @ 20 ml/hr (480 ml/day)  - Additional lipid kcal being provided by propofol infusion  NUTRITION DIAGNOSIS:   Severe Malnutrition related to chronic illness as evidenced by severe fat depletion, severe muscle depletion.  Ongoing, being addressed via TPN  GOAL:   Patient will meet greater than or equal to 90% of their needs  Met via TPN  MONITOR:   Vent status, Labs, Weight trends, Skin, I & O's, Other (TPN)  REASON FOR ASSESSMENT:   Consult New TPN/TNA  ASSESSMENT:   57 y.o. 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 Patient found to have  fistulous connections to GJ junction and small bowel  7/19 - admitted 7/22 - started on clear liquids, later advanced to soft diet 7/23 - diet backed down to clear liquids, PICC line placed 7/24 - TPN initiated 7/25 - CT abdomen showing severe fatty liver, thickened appearance of colon, ascites, diffuse mesenteric and subcutaneous edema and anasarca 7/26 - NPO, s/p emergent thoracentesis with 800 mL removed 7/27 - transferred to ICU early in AM, intubated, ARDS 7/30 - self-extubated, transitioned to cyclic TPN 7/31 - pt reintubated due to respiratory distress, back to continuous TPN  Pt with GJ-colonic fistula with plans for possible surgery in the next 2-3 weeks. Per Surgery, no plans for TF proximal to GJ anastomosis.  Consult received for enteral nutrition initiation and management as part of ARDS protocol. Pt currently with  OG tube in stomach per x-ray; OG tube is clamped at this time. Enteral nutrition is not appropriate at this time; no enteral access distal to GJ anastomosis.  Per TPN Pharmacist, pt being transitioned back to concentrated continuous TPN at 1800 today. At rate of 65 ml/hr, TPN will provide 1968 kcal and 111 grams of protein.  Unsure of true dry weight at this time. Current weigh is up 15 kg compared to admission weight, likely related to volume/fluid. Pt with mild pitting generalized edema, mild pitting edema to BUE, and moderate pitting edema to BLE.  Admit weight: 53.2 kg Current weight: 68.8 kg  Patient is currently intubated on ventilator support Temp (24hrs), Avg:98.1 F (36.7 C), Min:97.4 F (36.3 C), Max:98.9 F (37.2 C)  Drips: Propofol: 40 mcg/kg/min (provides 434 kcal daily from lipid) Fentanyl TPN NS: 10 ml/hr  Medications reviewed and include: colace, IV lasix 40 mg BID, SSI every 4 hours, IV protonix, miralax, IV KCl 10 mEq x 8 followed by IV KCl 10 mEq x 6  Labs reviewed: sodium 147, potassium 3.5, BUN 26, WBC 22.9, hemoglobin 8.4, platelets 86 CBG's: 98-201 x 24 hours  UOP: 1865 ml x 24 hours I/O's: +7.6 L since admit  Diet Order:   Diet Order             Diet NPO time specified  Diet effective now                   EDUCATION NEEDS:   Not appropriate for education at this time  Skin:  Skin Assessment: Reviewed RN Assessment  Last BM:  04/24/23 small type 6  Height:   Ht Readings from Last 1 Encounters:  04/18/23 6\' 3"  (1.905 m)    Weight:   Wt Readings from Last 1 Encounters:  04/29/23 68.8 kg    BMI:  Body mass index is 18.96 kg/m.  Estimated Nutritional Needs:   Kcal:  1850-2150 kcals  Protein:  110-125 grams  Fluid:  1.8 L    Mertie Clause, MS, RD, LDN Registered Dietitian II Please see AMiON for contact information.

## 2023-04-29 NOTE — Progress Notes (Signed)
eLink Physician-Brief Progress Note Patient Name: Brent Rogers DOB: 1966-02-11 MRN: [id number]   Date of Service  04/29/2023  HPI/Events of Note  Patient with ARDS extubated earlier today and now in acute hypoxemic respiratory failure.  eICU Interventions  PCCM ground crew asked to come and intubate patient.        Thomasene Lot Timberlyn Pickford 04/29/2023, 12:34 AM

## 2023-04-29 NOTE — Progress Notes (Signed)
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  Attempting conservative management and TPN to try to help nutritional status.  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  Denies any frank aspiration, breathing worsened after a BM.  CXR with worsening bilateral infiltrates. After discussion with rapid response and crosscover TRH, PCCM consulted to assist with management.  Patient states breathing is stable from this am, has not been able to sleep due to anxiety over SOB.  2022 echo normal  Pertinent  Medical History  Chronic pancreatitis Prior Whipple Severe protein calorie malnutrition Question of alcohol abuse but multiple previous notes state his use is modest at best.  I do see a level of 134 back in 2021. HTN  Significant Hospital Events: Including procedures, antibiotic start and stop dates in addition to other pertinent events   7/19 admit 04/24/2023 thoracentesis with 800 cc of fluid obtained 7/29-tolerating SBT 7/30 self extubated during SBT. 7/31 reintubated for increased respiratory distress.  Interim History / Subjective:  Self extubated yesterday.  Did well when calm on heated high flow oxygen.  Reintubated last night for did distress.  Still unclear whether agitation was a significant factor.  Objective   Blood pressure 112/83, pulse (!) 104, temperature 97.7 F (36.5 C), temperature source Axillary, resp. rate 17, height 6\' 3"  (1.905 m), weight 68.8 kg, SpO2 100%. CVP:  [6 mmHg-9 mmHg] 7 mmHg  Vent Mode: PRVC FiO2 (%):  [60 %-100 %] 60 % Set Rate:  [18 bmp-26 bmp] 26 bmp Vt Set:  [540 mL] 540  mL PEEP:  [8 cmH20] 8 cmH20 Plateau Pressure:  [17 cmH20-24 cmH20] 17 cmH20   Intake/Output Summary (Last 24 hours) at 04/29/2023 1107 Last data filed at 04/29/2023 1000 Gross per 24 hour  Intake 2525 ml  Output 1675 ml  Net 850 ml   Filed Weights   04/27/23 0500 04/28/23 0215 04/29/23 0338  Weight: 69.9 kg 68.5 kg 68.8 kg    Examination:   Physical exam: General: Calm intubated.  Cachectic. HEENT: [place]/AT, eyes anicteric.  OG tube ET tube in place Neuro: Sedated. Chest: Chest clear to auscultation bilaterally. Heart: Regular rate and rhythm, no murmurs or gallops Abdomen: Soft, nondistended, bowel sounds present Skin: No rash  Ancillary tests personally reviewed  Normal electrolytes, normal creatinine Leukocytosis decreased to 24.1 Adequate glycemic control. Improving thrombocytopenia to 84. X-ray shows improving but persistent bilateral interstitial infiltrates. Assessment & Plan:  Acute hypoxic respiratory failure causing ARDS from aspiration. Septic shock due to aspiration pneumonia.  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  Daily SBT's. -Continue to diurese to optimize pulmonary status. -Interval periods of agitation will need to ensure that lungs are entirely optimized prior to attempting extubation again. -Continue Precedex to control agitation. -Complete 7-day course of Zosyn. -Mains off vasopressors -Continue TPN -Continue to monitor CBC.   Best Practice (right click  and "Reselect all SmartList  Selections" daily)   Diet/type: TPN DVT prophylaxis: Subcu anoxic GI prophylaxis: PPI Lines: Central line Foley:  N/A Code Status:  full code Last date of multidisciplinary goals of care discussion: Pending  CRITICAL CARE Performed by: Lynnell Catalan   Total critical care time: 35 minutes  Critical care time was exclusive of separately billable procedures and treating other patients.  Critical care was necessary to treat or prevent imminent or life-threatening deterioration.  Critical care was time spent personally by me on the following activities: development of treatment plan with patient and/or surrogate as well as nursing, discussions with consultants, evaluation of patient's response to treatment, examination of patient, obtaining history from patient or surrogate, ordering and performing treatments and interventions, ordering and review of laboratory studies, ordering and review of radiographic studies, pulse oximetry, re-evaluation of patient's condition and participation in multidisciplinary rounds.  Lynnell Catalan, MD [facility] ICU Physician [facility]  Pager: [phone] Mobile: [phone] After hours: [phone].

## 2023-04-29 NOTE — Plan of Care (Signed)

## 2023-04-30 DIAGNOSIS — J80 Acute respiratory distress syndrome: Secondary | ICD-10-CM | POA: Diagnosis not present

## 2023-04-30 LAB — BASIC METABOLIC PANEL
Anion gap: 5 (ref 5–15)
BUN: 26 mg/dL — ABNORMAL HIGH (ref 6–20)
CO2: 35 mmol/L — ABNORMAL HIGH (ref 22–32)
Calcium: 8 mg/dL — ABNORMAL LOW (ref 8.9–10.3)
Chloride: 107 mmol/L (ref 98–111)
Creatinine, Ser: 0.49 mg/dL — ABNORMAL LOW (ref 0.61–1.24)
GFR, Estimated: >60 mL/min (ref >60)
Glucose, Bld: 120 mg/dL — ABNORMAL HIGH (ref 70–99)
Potassium: 4.1 mmol/L (ref 3.5–5.1)
Sodium: 147 mmol/L — ABNORMAL HIGH (ref 135–145)

## 2023-04-30 LAB — GLUCOSE, CAPILLARY
Glucose-Capillary: 80 mg/dL (ref 70–99)
Glucose-Capillary: 74 mg/dL (ref 70–99)
Glucose-Capillary: 97 mg/dL (ref 70–99)
Glucose-Capillary: 108 mg/dL — ABNORMAL HIGH (ref 70–99)
Glucose-Capillary: 108 mg/dL — ABNORMAL HIGH (ref 70–99)
Glucose-Capillary: 122 mg/dL — ABNORMAL HIGH (ref 70–99)
Glucose-Capillary: 101 mg/dL — ABNORMAL HIGH (ref 70–99)

## 2023-04-30 MED ORDER — DEXTROSE 10 % IV SOLN: INTRAVENOUS | Status: AC

## 2023-04-30 MED ORDER — POTASSIUM CHLORIDE 10 MEQ/50ML IV SOLN
10.0000 meq | INTRAVENOUS | Status: AC
  Administered 2023-04-30 (×6): 10 meq via INTRAVENOUS
  Filled 2023-04-30 (×6): qty 50

## 2023-04-30 MED ORDER — FUROSEMIDE 10 MG/ML IJ SOLN
60.0000 mg | Freq: Two times a day (BID) | INTRAMUSCULAR | Status: DC
  Administered 2023-04-30 – 2023-05-02 (×4): 60 mg via INTRAVENOUS
  Filled 2023-04-30 (×4): qty 6

## 2023-04-30 MED ORDER — POTASSIUM CHLORIDE 10 MEQ/100ML IV SOLN: 10.0000 meq | INTRAVENOUS | Status: DC

## 2023-04-30 MED ORDER — CALCIUM GLUCONATE-NACL 1-0.675 GM/50ML-% IV SOLN
1.0000 g | Freq: Once | INTRAVENOUS | Status: AC
  Administered 2023-04-30: 1000 mg via INTRAVENOUS
  Filled 2023-04-30: qty 50

## 2023-04-30 MED ORDER — CHLORHEXIDINE GLUCONATE CLOTH 2 % EX PADS
6.0000 | MEDICATED_PAD | Freq: Every day | CUTANEOUS | Status: DC
  Administered 2023-05-01 – 2023-05-15 (×12): 6 via TOPICAL

## 2023-04-30 MED ORDER — TRACE MINERALS CU-MN-SE-ZN 300-55-60-3000 MCG/ML IV SOLN
INTRAVENOUS | Status: AC
  Filled 2023-04-30: qty 832

## 2023-04-30 NOTE — Progress Notes (Signed)
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  Attempting conservative management and TPN to try to help nutritional status.  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  Denies any frank aspiration, breathing worsened after a BM.  CXR with worsening bilateral infiltrates. After discussion with rapid response and crosscover TRH, PCCM consulted to assist with management.  Patient states breathing is stable from this am, has not been able to sleep due to anxiety over SOB.  2022 echo normal  Pertinent  Medical History  Chronic pancreatitis Prior Whipple Severe protein calorie malnutrition Question of alcohol abuse but multiple previous notes state his use is modest at best.  I do see a level of 134 back in 2021. HTN  Significant Hospital Events: Including procedures, antibiotic start and stop dates in addition to other pertinent events   7/19 admit 04/24/2023 thoracentesis with 800 cc of fluid obtained 7/29-tolerating SBT 7/30 self extubated during SBT. 7/31 reintubated for increased respiratory distress.  Interim History / Subjective:  Failed SBT this morning. Agitation is a major confounder.   Objective   Blood pressure 103/75, pulse 99, temperature (!) 97.5 F (36.4 C), temperature source Axillary, resp. rate (!) 26, height 6\' 3"  (1.905 m), weight 69.2 kg, SpO2 98%. CVP:  [5 mmHg-15 mmHg] 11 mmHg  Vent Mode: PRVC FiO2 (%):  [40 %-60 %] 40 % Set Rate:  [26 bmp] 26 bmp Vt Set:  [540 mL] 540 mL PEEP:  [8 cmH20] 8 cmH20 Plateau Pressure:  [13 cmH20-22 cmH20] 20 cmH20   Intake/Output Summary (Last 24 hours) at  04/30/2023 1015 Last data filed at 04/30/2023 0900 Gross per 24 hour  Intake 2507.09 ml  Output 2545 ml  Net -37.91 ml   Filed Weights   04/28/23 0215 04/29/23 0338 04/30/23 0500  Weight: 68.5 kg 68.8 kg 69.2 kg    Examination:   Physical exam: General: Calm intubated.  Cachectic. HEENT: [place]/AT, eyes anicteric.  OG tube ET tube in place Neuro: Sedated. Chest: Chest clear to auscultation bilaterally. Heart: Regular rate and rhythm, no murmurs or gallops Abdomen: Soft, nondistended, bowel sounds present Skin: No rash  Ancillary tests personally reviewed  Normal electrolytes, normal creatinine Leukocytosis decreased to 16.4 Adequate glycemic control. Thrombocytopenia to 53. X-ray shows improving but persistent bilateral interstitial infiltrates. Assessment & Plan:  Acute hypoxic respiratory failure causing ARDS from aspiration and volume overload. Septic shock due to aspiration pneumonia.  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  Daily SBT's. -Continue to diurese to optimize pulmonary status.  Diuretic dose increased 8/1 as not yet in negative fluid balance.  - Cycling TPN to minimize fluids.  -Interval periods of agitation will need to ensure that lungs are entirely optimized prior to attempting extubation again. -Continue Precedex to control agitation. -Complete 7-day course of Zosyn. -Mains off vasopressors -Continue TPN -Continue to monitor CBC.  Best Practice (right click and "Reselect all SmartList  Selections" daily)   Diet/type: TPN DVT prophylaxis: Subcu anoxic GI prophylaxis: PPI Lines: Central line Foley:  N/A Code Status:  full code Last date of multidisciplinary goals of care discussion: Pending  CRITICAL CARE Performed by: Lynnell Catalan   Total critical care time: 35 minutes  Critical care time was exclusive of separately billable procedures and treating other patients.  Critical care was necessary to treat or prevent imminent or life-threatening deterioration.  Critical care was time spent personally by me on the following activities: development of treatment plan with patient and/or surrogate as well as nursing, discussions with consultants, evaluation of patient's response to treatment, examination of patient, obtaining history from patient or surrogate, ordering and performing treatments and interventions, ordering and review of laboratory studies, ordering and review of radiographic studies, pulse oximetry, re-evaluation of patient's condition and participation in multidisciplinary rounds.  Lynnell Catalan, MD [facility] ICU Physician [facility] [place] Critical Care  Pager: [phone] Mobile: [phone] After hours: [phone].

## 2023-04-30 NOTE — Progress Notes (Signed)
Patient ID: Brent Rogers, male   DOB: May 30, 1966, 57 y.o.   MRN: [id number]      Subjective: On vent ROS negative except as listed above. Objective: Vital signs in last 24 hours: Temp:  [97.4 F (36.3 C)-98 F (36.7 C)] 97.5 F (36.4 C) (08/01 0745) Pulse Rate:  [85-126] 97 (08/01 0723) Resp:  [12-27] 27 (08/01 0723) BP: (88-118)/(61-89) 104/80 (08/01 0723) SpO2:  [96 %-100 %] 97 % (08/01 0723) Arterial Line BP: (81-231)/(27-78) 113/64 (08/01 0700) FiO2 (%):  [40 %-60 %] 40 % (08/01 0723) Weight:  [69.2 kg] 69.2 kg (08/01 0500) Last BM Date : 04/24/23  Intake/Output from previous day: 07/31 0701 - 08/01 0700 In: 2507.1 [I.V.:2107.5; IV Piggyback:399.6] Out: 2145 [Urine:2145] Intake/Output this shift: No intake/output data recorded.  General appearance: no distress GI: soft, NT Extremities: no sig edema  Lab Results: CBC  Recent Labs    04/29/23 0329 04/30/23 0402  WBC 22.9* 16.4*  HGB 8.4* 7.9*  HCT 26.1* 25.3*  PLT 86* 53*   BMET Recent Labs    04/29/23 1817 04/30/23 0402  NA 147* 145  K 4.2 3.8  CL 106 104  CO2 34* 33*  GLUCOSE 95 83  BUN 26* 27*  CREATININE 0.58* 0.48*  CALCIUM 8.1* 8.1*   PT/INR No results for input(s): "LABPROT", "INR" in the last 72 hours. ABG Recent Labs    04/29/23 0156 04/29/23 0327  PHART 7.258* 7.390  HCO3 37.2* 35.8*    Studies/Results: DG CHEST PORT 1 VIEW  Result Date: 04/29/2023 CLINICAL DATA:  141880 SOB (shortness of breath) 141880 EXAM: PORTABLE CHEST - 1 VIEW COMPARISON:  Earlier film of the same day FINDINGS: Endotracheal tube has been advanced, tip 6.6 cm above carina. Gastric tube and right arm PICC stable. Slight improvement in the bilateral interstitial edema or infiltrates. Heart size and mediastinal contours are within normal limits. Small left pleural effusion. Visualized bones unremarkable. IMPRESSION: 1. Slight improvement in bilateral interstitial edema or infiltrates. 2. Support hardware positioned  as above. Electronically Signed   By: Corlis Leak M.D.   On: 04/29/2023 10:18   DG CHEST PORT 1 VIEW  Result Date: 04/29/2023 CLINICAL DATA:  Endotracheal tube. EXAM: PORTABLE CHEST 1 VIEW COMPARISON:  04/28/2023. FINDINGS: The heart size and mediastinal contours are stable. The pulmonary vasculature is distended. Diffuse airspace opacities are noted bilaterally and slightly increased from the prior exam. There is a small right pleural effusion. No pneumothorax. A right PICC line terminates over the superior vena cava. An enteric tube terminates in the stomach. An endotracheal tube terminates 7.7 cm above the carina. IMPRESSION: 1. Pulmonary vascular congestion. 2. Interval worsening of patchy airspace opacities in the lungs bilaterally. 3. Small right pleural effusion. 4. Support apparatus as described above. Electronically Signed   By: Thornell Sartorius M.D.   On: 04/29/2023 01:45    Anti-infectives: Anti-infectives (From admission, onward)    Start     Dose/Rate Route Frequency Ordered Stop   04/24/23 0300  micafungin (MYCAMINE) 100 mg in sodium chloride 0.9 % 100 mL IVPB  Status:  Discontinued        100 mg 105 mL/hr over 1 Hours Intravenous Daily 04/24/23 0108 04/26/23 0747   04/24/23 0200  vancomycin (VANCOCIN) IVPB 1000 mg/200 mL premix  Status:  Discontinued        1,000 mg 200 mL/hr over 60 Minutes Intravenous 2 times daily 04/24/23 0114 04/26/23 0747   04/23/23 1400  piperacillin-tazobactam (ZOSYN) IVPB 3.375 g  Status:  Discontinued        3.375 g 12.5 mL/hr over 240 Minutes Intravenous Every 8 hours 04/23/23 1138 04/28/23 0755   04/20/23 1200  cefTRIAXone (ROCEPHIN) 2 g in sodium chloride 0.9 % 100 mL IVPB  Status:  Discontinued        2 g 200 mL/hr over 30 Minutes Intravenous Every 24 hours 04/20/23 1133 04/23/23 1138   04/18/23 1600  cefTRIAXone (ROCEPHIN) 2 g in sodium chloride 0.9 % 100 mL IVPB  Status:  Discontinued        2 g 200 mL/hr over 30 Minutes Intravenous Every 24 hours  04/17/23 1807 04/20/23 1133   04/18/23 0500  metroNIDAZOLE (FLAGYL) IVPB 500 mg  Status:  Discontinued        500 mg 100 mL/hr over 60 Minutes Intravenous Every 12 hours 04/17/23 1807 04/23/23 1138   04/17/23 1600  cefTRIAXone (ROCEPHIN) 1 g in sodium chloride 0.9 % 100 mL IVPB        1 g 200 mL/hr over 30 Minutes Intravenous  Once 04/17/23 1550 04/17/23 1738   04/17/23 1600  metroNIDAZOLE (FLAGYL) IVPB 500 mg        500 mg 100 mL/hr over 60 Minutes Intravenous  Once 04/17/23 1550 04/17/23 1747       Assessment/Plan: Hx of Whipple in 2015 Possible colitis  GJ-colonic fistula Severe protein calorie malnutrition  - on TPN and having some bowel function  - GJ-colonic fistula will almost certainly not heal on its own, but nutrition and pulmonary status need to be better prior to surgical intervention. I D/W Dr. Donell Beers yesterday. - will continue to follow peripherally to assess surgical readiness and assist with planning/DC needs   Acute hypercarbic ventilator dependent respiratory failure - per CCM. Appreciate their care as below  FEN: TPN  VTE: therapeutic LMWH ID: Zosyn  - per CCM -   Pleural effusion s/p thora Severe leukemoid reaction  UTI with Klebsiella  Anemia of critical illness Thrombocytopenia - plts 53K   LOS: 12 days    Violeta Gelinas, MD, MPH, FACS Trauma & General Surgery Use [email or web address] to contact on call provider  04/30/2023

## 2023-04-30 NOTE — Plan of Care (Signed)
  Problem: Education: Goal: Knowledge of General Education information will improve Description: Including pain rating scale, medication(s)/side effects and non-pharmacologic comfort measures Outcome: Not Progressing   Problem: Health Behavior/Discharge Planning: Goal: Ability to manage health-related needs will improve Outcome: Not Progressing   Problem: Clinical Measurements: Goal: Respiratory complications will improve Outcome: Not Progressing   Problem: Clinical Measurements: Goal: Cardiovascular complication will be avoided Outcome: Not Progressing   Problem: Nutrition: Goal: Adequate nutrition will be maintained Outcome: Not Progressing

## 2023-04-30 NOTE — Progress Notes (Signed)
PHARMACY - TOTAL PARENTERAL NUTRITION CONSULT NOTE  Indication: Gastrocolic fistula   Patient Measurements: Height: 6\' 3"  (190.5 cm) Weight: 69.2 kg (152 lb 8.9 oz) IBW/kg (Calculated) : 84.5 TPN AdjBW (KG): 53.2 Body mass index is 19.07 kg/m. 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  Patient presented on 7/19 with N/V x 4 days along with LLQ pain.  Per documentation, CT showed thickening of the distal transverse colon, where there is a complex fistulous connection between the transverse colon to the gastrojejunal anastomosis and adjacent small bowel loops.  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  Pharmacy consulted to manage TPN.  Patient was started on CLD on 7/22 and reported fullness/abdominal pain after eating.  He reports not eating for a week PTA.  He does not weigh himself, but he knows he has lost a lot of weight; his pants size reduced from a 36 to a 28 over a 6 month time frame.  He typically eats 2 meals a day (lunch and dinner) and he eats protein, vegetables and carb; he rarely eats fruits. Pt emergently intubated 7/27 and made NPO status. Pt self-extubated on 7/30 AM, but required emergent reintubation on 7/31 around midnight.  Glucose / Insulin: no hx DM - CBGs low of 28 (was off TPN at the time due to transition from cyclic TPN)  and currently 80-90s with 18 units of insulin in TPN  - 6 units SSI given, required D50. Will remove insulin from TPN.  7/27-7/29 patient was initiated on insulin glargine for steroid-induced hyperglycemia, glargine d/c'd on 7/30 Electrolytes: Na 145, K 3.8, CO2 33, coCa 9.9, Mag up 2.5. all others WNL Renal: SCr < 1, BUN trending up to 27 Hepatic: AST up at 69, others within normal limits. TG 149, albumin 1.8, 7/24 prealbumin < 5 Intake / Output; MIVF: UOP 1.3 ml/kg/hr - increased diuresis 8/1 (60  IV BID). Net +7L. LBM 7/26  GI Imaging:  7/25 CT Abd: severe fatty liver, thickened appearance of colon GI Surgeries / Procedures: none since TPN initiation  Central access: PICC placed 04/21/23 TPN start date: 04/22/23  Nutritional Goals: Goal concentrated TPN rate is 70 mL/hr (provides 124g of protein and 1958 kcals per day) --- with propofol, goal rate  65 ml/hr (124g protein and total with propofol of ~2000 kcal)  RD Estimated Needs Total Energy Estimated Needs: 1850-2150 kcals Total Protein Estimated Needs: 110-125 g Total Fluid Estimated Needs: 1.8 L  Current Nutrition:  NPO + TPN Propofol at 14.4 mL/hr (~345 kcal)  Plan:  Due to hypoglycemia, discontinue current TPN and hang D10 until bag change at 1800.  Restart concentrated TPN at goal rate 65 ml/hr - next bag at 1800  TPN to provide 124g AA and with propofol ~2000 kCal, meeting 100% of nutritional needs. Glucose infusion rate: 2.5 mg/kg/min  Electrolytes in TPN: Na 130 mEq/L, K increased to 140 mEq/L (~126 mEq in bag), Ca 5 mEq/L, Mag decreased to 5 mEq/L, Phos 25 mmol/L, Cl:Ac to 2:1 Add daily multivitamin and trace elements in TPN Remove insulin from TPN.  Conitnue sensitive SSI and CBG checks to q6h Give additional KCl x6 outside of TPN  Monitor diuresis plan to account for K replacement outside of TPN KCl IV x6 then recheck in PM.   Standard TPN labs daily until stable, then every Mon/Thurs   Thank you for allowing  pharmacy to be a part of this patient's care.  Link Snuffer, PharmD, BCPS, BCCCP Clinical Pharmacist 04/30/2023 10:51 AM  Please refer to [facility] for pharmacy phone number

## 2023-05-01 DIAGNOSIS — N3001 Acute cystitis with hematuria: Secondary | ICD-10-CM | POA: Diagnosis not present

## 2023-05-01 DIAGNOSIS — J9601 Acute respiratory failure with hypoxia: Secondary | ICD-10-CM | POA: Diagnosis not present

## 2023-05-01 DIAGNOSIS — E871 Hypo-osmolality and hyponatremia: Secondary | ICD-10-CM | POA: Diagnosis not present

## 2023-05-01 LAB — GLUCOSE, CAPILLARY
Glucose-Capillary: 158 mg/dL — ABNORMAL HIGH (ref 70–99)
Glucose-Capillary: 97 mg/dL (ref 70–99)
Glucose-Capillary: 112 mg/dL — ABNORMAL HIGH (ref 70–99)
Glucose-Capillary: 79 mg/dL (ref 70–99)
Glucose-Capillary: 166 mg/dL — ABNORMAL HIGH (ref 70–99)
Glucose-Capillary: 100 mg/dL — ABNORMAL HIGH (ref 70–99)

## 2023-05-01 LAB — BASIC METABOLIC PANEL
Anion gap: 6 (ref 5–15)
BUN: 26 mg/dL — ABNORMAL HIGH (ref 6–20)
CO2: 32 mmol/L (ref 22–32)
Calcium: 8.3 mg/dL — ABNORMAL LOW (ref 8.9–10.3)
Chloride: 111 mmol/L (ref 98–111)
Creatinine, Ser: 0.57 mg/dL — ABNORMAL LOW (ref 0.61–1.24)
GFR, Estimated: >60 mL/min (ref >60)
Glucose, Bld: 175 mg/dL — ABNORMAL HIGH (ref 70–99)
Potassium: 3.9 mmol/L (ref 3.5–5.1)
Sodium: 149 mmol/L — ABNORMAL HIGH (ref 135–145)

## 2023-05-01 LAB — POCT I-STAT 7, (LYTES, BLD GAS, ICA,H+H)
Acid-Base Excess: 12 mmol/L — ABNORMAL HIGH (ref 0.0–2.0)
Bicarbonate: 37.2 mmol/L — ABNORMAL HIGH (ref 20.0–28.0)
Calcium, Ion: 1.23 mmol/L (ref 1.15–1.40)
HCT: 26 % — ABNORMAL LOW (ref 39.0–52.0)
Hemoglobin: 8.8 g/dL — ABNORMAL LOW (ref 13.0–17.0)
O2 Saturation: 99 %
Patient temperature: 36.4
Potassium: 4.2 mmol/L (ref 3.5–5.1)
Sodium: 149 mmol/L — ABNORMAL HIGH (ref 135–145)
TCO2: 39 mmol/L — ABNORMAL HIGH (ref 22–32)
pCO2 arterial: 51.1 mmHg — ABNORMAL HIGH (ref 32–48)
pH, Arterial: 7.468 — ABNORMAL HIGH (ref 7.35–7.45)
pO2, Arterial: 118 mmHg — ABNORMAL HIGH (ref 83–108)

## 2023-05-01 MED ORDER — POTASSIUM CHLORIDE 10 MEQ/50ML IV SOLN
10.0000 meq | INTRAVENOUS | Status: AC
  Administered 2023-05-01 (×2): 10 meq via INTRAVENOUS
  Filled 2023-05-01 (×2): qty 50

## 2023-05-01 MED ORDER — ALBUMIN HUMAN 25 % IV SOLN
25.0000 g | Freq: Four times a day (QID) | INTRAVENOUS | Status: AC
  Administered 2023-05-01 – 2023-05-02 (×4): 25 g via INTRAVENOUS
  Filled 2023-05-01 (×4): qty 100

## 2023-05-01 MED ORDER — TRACE MINERALS CU-MN-SE-ZN 300-55-60-3000 MCG/ML IV SOLN
INTRAVENOUS | Status: AC
  Filled 2023-05-01: qty 832

## 2023-05-01 MED ORDER — DEXMEDETOMIDINE HCL IN NACL 400 MCG/100ML IV SOLN
0.0000 ug/kg/h | INTRAVENOUS | Status: DC
  Administered 2023-05-01: 0.8 ug/kg/h via INTRAVENOUS
  Administered 2023-05-01: 0.4 ug/kg/h via INTRAVENOUS
  Administered 2023-05-02: 0.6 ug/kg/h via INTRAVENOUS
  Filled 2023-05-01 (×3): qty 100

## 2023-05-01 MED ORDER — POTASSIUM CHLORIDE 10 MEQ/50ML IV SOLN
10.0000 meq | INTRAVENOUS | Status: AC
  Administered 2023-05-01 (×4): 10 meq via INTRAVENOUS
  Filled 2023-05-01 (×4): qty 50

## 2023-05-01 MED ORDER — POTASSIUM CHLORIDE 10 MEQ/100ML IV SOLN: 10.0000 meq | INTRAVENOUS | Status: DC

## 2023-05-01 NOTE — Progress Notes (Addendum)
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  Attempting conservative management and TPN to try to help nutritional status.  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  Denies any frank aspiration, breathing worsened after a BM.  CXR with worsening bilateral infiltrates. After discussion with rapid response and crosscover TRH, PCCM consulted to assist with management.  Patient states breathing is stable from this am, has not been able to sleep due to anxiety over SOB.  2022 echo normal  Pertinent  Medical History  Chronic pancreatitis Prior Whipple Severe protein calorie malnutrition Question of alcohol abuse but multiple previous notes state his use is modest at best.  I do see a level of 134 back in 2021. HTN  Significant Hospital Events: Including procedures, antibiotic start and stop dates in addition to other pertinent events   7/19 admit 7/26 thoracentesis with 800 cc of fluid obtained 7/29 tolerating SBT 7/30 self extubated during SBT. 7/31 reintubated for increased respiratory distress. 8/1 failed SBT, significant agitation  8/2 Failed SBT again this am due to agitation, remains on fentanyl and propofol   Interim History / Subjective:  Seen spontaneously moving in bed   Objective   Blood pressure (!) 89/67, pulse 83, temperature 97.6 F (36.4 C), temperature source Axillary, resp. rate (!) 26, height 6\' 3"  (1.905 m), weight 68 kg, SpO2 100%. CVP:  [3 mmHg-14 mmHg] 3 mmHg  Vent Mode: PRVC FiO2 (%):  [40 %] 40 % Set Rate:  [16 bmp-26 bmp] 16 bmp Vt Set:  [540 mL] 540 mL PEEP:  [8 cmH20] 8  cmH20 Plateau Pressure:  [16 cmH20-23 cmH20] 23 cmH20   Intake/Output Summary (Last 24 hours) at 05/01/2023 4098 Last data filed at 05/01/2023 0700 Gross per 24 hour  Intake 2620.08 ml  Output 3600 ml  Net -979.92 ml   Filed Weights   04/29/23 0338 04/30/23 0500 05/01/23 0500  Weight: 68.8 kg 69.2 kg 68 kg    Examination: General: Acute on chronically ill appearing cachetic middle aged male lying in bed on mechanical ventilation, in NAD HEENT: [place]/AT, MM pink/moist, PERRL,  Neuro: Sedated on vent  CV: s1s2 regular rate and rhythm, no murmur, rubs, or gallops,  PULM:  Clear to auscultation, no increased work of breathing, no added breath sounds,  GI: soft, bowel sounds active in all 4 quadrants, non-tender, non-distended, tolerating TPN Extremities: warm/dry, 1+ edema  Skin: no rashes or lesions  Resolved problems:  Septic shock  Severe leukemoid reaction  Hyponatremia  Hypokalemia  Hypophosphatemia Hypomagnesemia  Assessment & Plan:  Acute hypoxic respiratory failure causing ARDS from aspiration and volume overload. -Received Ceftriaxone 7/19-7/25, Flagyl 7/19-7/25, Zosyn 7/25-7/30, Vanc 7/26-7/28, and Micafungin 7/26-7/28 -Failed extubted twice this admission  Right pleural effusion s/p thoracentesis - removed via thora 7/26 P: Continue ventilator support with lung protective strategies  Wean PEEP and FiO2 for sats greater than 90%. Head of bed elevated 30 degrees. Plateau pressures less than 30 cm H20.  Follow intermittent chest x-ray and ABG.   SAT/SBT as tolerated, mentation preclude extubation  Ensure adequate pulmonary hygiene  Follow cultures  VAP bundle in place  PAD protocol  Gastrocolic fistula,  being manage conservatively -Will likely need surgical repair once medically optimized  Acute colitis Pancreatic mass status post Whipple's 2015 Severe protein calorie malnutrition  P: Primary management per Surgery  Continue TPN Per surgery  Supportive  care  Optimize electrolytes   Acute urinary tract infection with Klebsiella P: Antibiotics as above   Hypernatremia -Likely due to diuretics  P: Trend  Anemia of critical illness Thrombocytopenia in the setting of sepsis  P: Transfuse per protocol  Hgb goal > 7 Trend CBC   Best Practice (right click and "Reselect all SmartList Selections" daily)   Diet/type: TPN DVT prophylaxis: Subcu anoxic GI prophylaxis: PPI Lines: Central line Foley:  N/A Code Status:  full code Last date of multidisciplinary goals of care discussion: Pending  CRITICAL CARE Performed by: Andrej Spagnoli D. Harris  Total critical care time: 36 minutes  Critical care time was exclusive of separately billable procedures and treating other patients.  Critical care was necessary to treat or prevent imminent or life-threatening deterioration.  Critical care was time spent personally by me on the following activities: development of treatment plan with patient and/or surrogate as well as nursing, discussions with consultants, evaluation of patient's response to treatment, examination of patient, obtaining history from patient or surrogate, ordering and performing treatments and interventions, ordering and review of laboratory studies, ordering and review of radiographic studies, pulse oximetry, re-evaluation of patient's condition and participation in multidisciplinary rounds.  Danaka Llera D. Harris, NP-C [facility] Personal contact information can be found on Amion  If no contact or response made please call [phone] 05/01/2023, 8:45 AM

## 2023-05-01 NOTE — Progress Notes (Signed)
Nutrition Follow-up  DOCUMENTATION CODES:   Severe malnutrition in context of chronic illness, Underweight  INTERVENTION:   TPN to meet 100% nutritional needs. Calorie needs updated to reflect IC measurements from 8/01. Discussed TPN plan in detail with TPN Pharmacist  Sodium trending up, noted pt on concentrated TPN and receiving sodium in TPN mixture (sodium decreased today in TPN mixture). Recommend increasing volume via free water in TPN provision as able and/or further decrease on sodium. Noted UOP 4 L in 24 hours, 2.865 L thus far today. Discussed with Pharmacist who will addressed tomorrow   NUTRITION DIAGNOSIS:   Severe Malnutrition related to chronic illness as evidenced by severe fat depletion, severe muscle depletion.  Being addressed  GOAL:   Patient will meet greater than or equal to 90% of their needs  Met via TPN  MONITOR:   Vent status, Labs, Weight trends, Skin, I & O's, Other (Comment) (TPN)  REASON FOR ASSESSMENT:   Consult New TPN/TNA  ASSESSMENT:   57 y.o. 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   Noted UOP 4L in 24 hours; 2.7 L thus far today, noted sodium trending up. Noted pt on concentrated TPN and contains sodium in mixture but amount reduced by  Labs: sodium 149 (H),  Meds: ss novolog, lasix  Diet Order:   Diet Order             Diet NPO time  specified  Diet effective now                   EDUCATION NEEDS:   Not appropriate for education at this time  Skin:  Skin Assessment: Reviewed RN Assessment  Last BM:  04/24/23 small type 6  Height:   Ht Readings from Last 1 Encounters:  04/18/23 6\' 3"  (1.905 m)    Weight:   Wt Readings from Last 1 Encounters:  05/01/23 68 kg   BMI:  Body mass index is 18.74 kg/m.  Estimated Nutritional Needs:   Kcal:  1900-2000 kcals via IC  Protein:  110-125 g  Fluid:  1.8 L  [name] MS, RDN, LDN, CNSC Registered Dietitian 3 Clinical Nutrition RD Pager and On-Call Pager Number Located in [place]

## 2023-05-01 NOTE — Progress Notes (Signed)
PHARMACY - TOTAL PARENTERAL NUTRITION CONSULT NOTE  Indication: Gastrocolic fistula   Patient Measurements: Height: 6\' 3"  (190.5 cm) Weight: 68 kg (149 lb 14.6 oz) IBW/kg (Calculated) : 84.5 TPN AdjBW (KG): 53.2 Body mass index is 18.74 kg/m. 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  Patient presented on 7/19 with N/V x 4 days along with LLQ pain.  Per documentation, CT showed thickening of the distal transverse colon, where there is a complex fistulous connection between the transverse colon to the gastrojejunal anastomosis and adjacent small bowel loops.  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  Pharmacy consulted to manage TPN.  Patient was started on CLD on 7/22 and reported fullness/abdominal pain after eating.  He reports not eating for a week PTA.  He does not weigh himself, but he knows he has lost a lot of weight; his pants size reduced from a 36 to a 28 over a 6 month time frame.  He typically eats 2 meals a day (lunch and dinner) and he eats protein, vegetables and carb; he rarely eats fruits. Pt emergently intubated 7/27 and made NPO status. Pt self-extubated on 7/30 AM, but required emergent reintubation on 7/31 around midnight.  RD performed indirect calorimetry on 8/1 and determined patients caloric needs to be ~1900 kcal/day.   Glucose / Insulin: no hx DM - CBGs 97-158 - 3 units SSI given 7/27-7/29 patient was initiated on insulin glargine for steroid-induced hyperglycemia, glargine d/c'd on 7/30 7/31 - significant hypoglycemia noted when transitioning from cyclic TPN to continuous TPN 8/1 - insulin removed from TPN and BG stablized Electrolytes: Na 149, K 4.2 (post KCl), CO2 34, coCa 9.9 (post 1g Ca gluc), Mag 2.2. all others WNL Renal: SCr < 1, BUN 26 Hepatic: 8/1 AST up at 69, others within normal limits. TG 149,  albumin 1.8, 7/24 prealbumin < 5 Intake / Output; MIVF: UOP 1.3 ml/kg/hr - increased diuresis 8/1 (60 IV BID). Net +7L. LBM 7/26  GI Imaging:  7/25 CT Abd: severe fatty liver, thickened appearance of colon GI Surgeries / Procedures: none since TPN initiation  Central access: PICC placed 04/21/23 TPN start date: 04/22/23  Nutritional Goals: Goal concentrated TPN rate is 70 mL/hr (provides 125g of protein and 1660 kcals per day) --- with propofol, goal rate 65 ml/hr (124g protein and total with propofol of ~2000 kcal)  RD Estimated Needs Total Energy Estimated Needs: 1850-2150 kcals Total Protein Estimated Needs: 110-125 g Total Fluid Estimated Needs: 1.8 L  Current Nutrition:  NPO + TPN Propofol at 14.4 mL/hr (~345 kcal)  Plan:  Continue concentrated TPN at goal rate 65 ml/hr at 1800  Slightly modified TPN to provide 125g AA and with propofol ~2000 kCal, meeting 100% of nutritional needs. Glucose infusion rate: 2.5 mg/kg/min  Electrolytes in TPN: Na decreased to 125 mEq/L, K 40 mEq/L (~126 mEq in bag), Ca 5 mEq/L, Mag 5 mEq/L, Phos 25 mmol/L, Cl:Ac to 2:1 Add daily multivitamin and trace elements in TPN Continue sensitive SSI and CBG checks q6h  Monitor diuresis plan to account for K replacement outside of TPN Give additional KCl IV x2 then recheck in PM since pt is receiving furosemide BID  Monitor propofol requirements and adjust lipids in TPN as needed, considering transition to dexmeditomidate Standard TPN labs tomorrow, then every Mon/Thurs   Thank you for allowing pharmacy to be a part  of this patient's care.  Wilburn Cornelia, PharmD, BCPS Clinical Pharmacist 05/01/2023 10:30 AM   Please refer to [facility] for pharmacy phone number

## 2023-05-01 NOTE — Plan of Care (Signed)
  Problem: Clinical Measurements: Goal: Cardiovascular complication will be avoided Outcome: Progressing   Problem: Nutrition: Goal: Adequate nutrition will be maintained Outcome: Progressing   Problem: Elimination: Goal: Will not experience complications related to urinary retention Outcome: Progressing   Problem: Safety: Goal: Ability to remain free from injury will improve Outcome: Progressing   Problem: Safety: Goal: Non-violent Restraint(s) Outcome: Progressing

## 2023-05-01 NOTE — Progress Notes (Signed)
PT Cancellation Note  Patient Details Name: Brent Rogers MRN: [id number] DOB: 1966-07-08   Cancelled Treatment:    Reason Eval/Treat Not Completed: Patient not medically ready. Nurse reports pt sedated on vent.   Angelina Ok [facility] 05/01/2023, 4:52 PM Skip Mayer PT Acute [employer] [phone]

## 2023-05-02 DIAGNOSIS — E871 Hypo-osmolality and hyponatremia: Secondary | ICD-10-CM | POA: Diagnosis not present

## 2023-05-02 DIAGNOSIS — K529 Noninfective gastroenteritis and colitis, unspecified: Secondary | ICD-10-CM | POA: Diagnosis not present

## 2023-05-02 DIAGNOSIS — J9601 Acute respiratory failure with hypoxia: Secondary | ICD-10-CM | POA: Diagnosis not present

## 2023-05-02 LAB — BASIC METABOLIC PANEL
Anion gap: 12 (ref 5–15)
BUN: 31 mg/dL — ABNORMAL HIGH (ref 6–20)
CO2: 29 mmol/L (ref 22–32)
Calcium: 9.1 mg/dL (ref 8.9–10.3)
Chloride: 109 mmol/L (ref 98–111)
Creatinine, Ser: 0.59 mg/dL — ABNORMAL LOW (ref 0.61–1.24)
GFR, Estimated: >60 mL/min (ref >60)
Glucose, Bld: 106 mg/dL — ABNORMAL HIGH (ref 70–99)
Potassium: 3.9 mmol/L (ref 3.5–5.1)
Sodium: 150 mmol/L — ABNORMAL HIGH (ref 135–145)

## 2023-05-02 LAB — CBC
HCT: 26.1 % — ABNORMAL LOW (ref 39.0–52.0)
Hemoglobin: 8.3 g/dL — ABNORMAL LOW (ref 13.0–17.0)
MCH: 30.2 pg (ref 26.0–34.0)
MCHC: 31.8 g/dL (ref 30.0–36.0)
MCV: 94.9 fL (ref 80.0–100.0)
Platelets: 44 10*3/uL — ABNORMAL LOW (ref 150–400)
RBC: 2.75 MIL/uL — ABNORMAL LOW (ref 4.22–5.81)
RDW: 23.3 % — ABNORMAL HIGH (ref 11.5–15.5)
WBC: 13.4 10*3/uL — ABNORMAL HIGH (ref 4.0–10.5)
nRBC: 1 % — ABNORMAL HIGH (ref 0.0–0.2)

## 2023-05-02 LAB — GLUCOSE, CAPILLARY
Glucose-Capillary: 177 mg/dL — ABNORMAL HIGH (ref 70–99)
Glucose-Capillary: 97 mg/dL (ref 70–99)
Glucose-Capillary: 129 mg/dL — ABNORMAL HIGH (ref 70–99)
Glucose-Capillary: 141 mg/dL — ABNORMAL HIGH (ref 70–99)

## 2023-05-02 LAB — MAGNESIUM: Magnesium: 2.3 mg/dL (ref 1.7–2.4)

## 2023-05-02 LAB — PHOSPHORUS: Phosphorus: 3.2 mg/dL (ref 2.5–4.6)

## 2023-05-02 MED ORDER — INSULIN ASPART 100 UNIT/ML IJ SOLN
1.0000 [IU] | Freq: Four times a day (QID) | INTRAMUSCULAR | Status: DC
  Administered 2023-05-02 (×2): 1 [IU] via SUBCUTANEOUS
  Administered 2023-05-03 (×2): 2 [IU] via SUBCUTANEOUS

## 2023-05-02 MED ORDER — MAGNESIUM SULFATE 2 GM/50ML IV SOLN
2.0000 g | Freq: Once | INTRAVENOUS | Status: AC
  Administered 2023-05-02: 2 g via INTRAVENOUS
  Filled 2023-05-02: qty 50

## 2023-05-02 MED ORDER — ORAL CARE MOUTH RINSE: 15.0000 mL | OROMUCOSAL | Status: DC | PRN

## 2023-05-02 MED ORDER — ONDANSETRON HCL 4 MG/2ML IJ SOLN
4.0000 mg | Freq: Three times a day (TID) | INTRAMUSCULAR | Status: DC | PRN
  Administered 2023-05-02 – 2023-05-21 (×19): 4 mg via INTRAVENOUS
  Filled 2023-05-02 (×20): qty 2

## 2023-05-02 MED ORDER — POTASSIUM CHLORIDE 10 MEQ/100ML IV SOLN
10.0000 meq | INTRAVENOUS | Status: AC
  Administered 2023-05-02 (×4): 10 meq via INTRAVENOUS
  Filled 2023-05-02 (×4): qty 100

## 2023-05-02 MED ORDER — TRACE MINERALS CU-MN-SE-ZN 300-55-60-3000 MCG/ML IV SOLN
INTRAVENOUS | Status: AC
  Filled 2023-05-02: qty 780

## 2023-05-02 MED ORDER — ORAL CARE MOUTH RINSE
15.0000 mL | OROMUCOSAL | Status: DC | PRN
  Administered 2023-05-15: 15 mL via OROMUCOSAL

## 2023-05-02 MED ORDER — METOCLOPRAMIDE HCL 5 MG/ML IJ SOLN
10.0000 mg | Freq: Once | INTRAMUSCULAR | Status: AC
  Administered 2023-05-02: 10 mg via INTRAVENOUS
  Filled 2023-05-02: qty 2

## 2023-05-02 MED ORDER — NYSTATIN 100000 UNIT/ML MT SUSP: 5.0000 mL | Freq: Four times a day (QID) | OROMUCOSAL | Status: DC

## 2023-05-02 MED ORDER — FUROSEMIDE 10 MG/ML IJ SOLN
40.0000 mg | Freq: Two times a day (BID) | INTRAMUSCULAR | Status: DC
  Administered 2023-05-02 – 2023-05-10 (×15): 40 mg via INTRAVENOUS
  Filled 2023-05-02 (×15): qty 4

## 2023-05-02 NOTE — Progress Notes (Signed)
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  Attempting conservative management and TPN to try to help nutritional status.  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  Denies any frank aspiration, breathing worsened after a BM.  CXR with worsening bilateral infiltrates. After discussion with rapid response and crosscover TRH, PCCM consulted to assist with management.  Patient states breathing is stable from this am, has not been able to sleep due to anxiety over SOB.  2022 echo normal  Pertinent  Medical History  Chronic pancreatitis Prior Whipple Severe protein calorie malnutrition Question of alcohol abuse but multiple previous notes state his use is modest at best.  I do see a level of 134 back in 2021. HTN  Significant Hospital Events: Including procedures, antibiotic start and stop dates in addition to other pertinent events   7/19 admit 7/26 thoracentesis with 800 cc of fluid obtained 7/29 tolerating SBT 7/30 self extubated during SBT. 7/31 reintubated for increased respiratory distress. 8/1 failed SBT, significant agitation  8/2 Failed SBT again this am due to agitation, remains on fentanyl and propofol   Interim History / Subjective:  Patient is tolerating supportive breathing trial He is awake, following commands No overnight issues  Objective   Blood pressure (!) 136/99, pulse 100, temperature 98.9 F (37.2 C), temperature source Axillary, resp. rate (!) 21, height 6\' 3"  (1.905 m), weight 67.7 kg, SpO2 94%. CVP:  [4 mmHg-8 mmHg] 7 mmHg  Vent Mode: PRVC FiO2 (%):  [40 %] 40 % Set Rate:  [18 bmp]  18 bmp Vt Set:  [540 mL] 540 mL PEEP:  [5 cmH20] 5 cmH20 Plateau Pressure:  [13 cmH20-15 cmH20] 15 cmH20   Intake/Output Summary (Last 24 hours) at 05/02/2023 2725 Last data filed at 05/02/2023 0700 Gross per 24 hour  Intake 3712.94 ml  Output 5240 ml  Net -1527.06 ml   Filed Weights   04/30/23 0500 05/01/23 0500 05/02/23 0413  Weight: 69.2 kg 68 kg 67.7 kg    Examination: General: Acute on chronically ill-appearing male, orally intubated HEENT: [place]/AT, eyes anicteric.  ETT and OGT in place Neuro:, Following commands, moving all 4 extremities Chest: Coarse breath sounds, no wheezes or rhonchi Heart: Regular rate and rhythm, no murmurs or gallops Abdomen: Soft, nondistended, bowel sounds present Skin: No rash   Labs and images were reviewed  Resolved problems:  Septic shock  Severe leukemoid reaction  Hyponatremia  Hypokalemia  Hypophosphatemia Hypomagnesemia  Assessment & Plan:  Acute hypoxic respiratory failure causing ARDS from aspiration and volume overload. Received Ceftriaxone 7/19-7/25, Flagyl 7/19-7/25, Zosyn 7/25-7/30, Vanc 7/26-7/28, and Micafungin 7/26-7/28 Right pleural effusion s/p thoracentesis Continue ventilator support with lung protective strategies  Patient is tolerating spontaneous breathing trial, will try to extubate him Completed antibiotic therapy White count is trending down, remained afebrile Avoid sedation  Gastrocolic fistula, being manage conservatively for now Will likely need surgical repair once medically optimized  Acute colitis Pancreatic mass status post Whipple's 2015 Severe protein calorie malnutrition  Primary management per Surgery  Continue TPN Per surgery  Supportive care  Optimize electrolytes   Acute urinary tract infection with Klebsiella Completed antibiotic therapy  Hypernatremia, likely  due to aggressive diuresis with albumin Monitor serum sodium General surgery do not use his gut yet  Anemia of critical  illness Thrombocytopenia in the setting of sepsis  Monitor H&H and platelet count  Best Practice (right click and "Reselect all SmartList Selections" daily)   Diet/type: TPN DVT prophylaxis: Subcu anoxic GI prophylaxis: PPI Lines: Central line Foley:  N/A Code Status:  full code Last date of multidisciplinary goals of care discussion: Pending   The patient is critically ill due to acute respiratory failure with hypoxia.  Critical care was necessary to treat or prevent imminent or life-threatening deterioration.  Critical care was time spent personally by me on the following activities: development of treatment plan with patient and/or surrogate as well as nursing, discussions with consultants, evaluation of patient's response to treatment, examination of patient, obtaining history from patient or surrogate, ordering and performing treatments and interventions, ordering and review of laboratory studies, ordering and review of radiographic studies, pulse oximetry, re-evaluation of patient's condition and participation in multidisciplinary rounds.   During this encounter critical care time was devoted to patient care services described in this note for 35 minutes.     Cheri Fowler, MD [facility] See Amion for pager If no response to pager, please call [phone] until 7pm After 7pm, Please call E-link [phone]

## 2023-05-02 NOTE — Progress Notes (Signed)
RN notified RT A line dressing was soiled, when changing dressing A line was lost due to pt agitation. Pressure dressing applied, RN notified.

## 2023-05-02 NOTE — Progress Notes (Addendum)
eLink Physician-Brief Progress Note Patient Name: Brent Rogers DOB: Jan 28, 1966 MRN: [id number]   Date of Service  05/02/2023  HPI/Events of Note  Nausea QTC 360 ms  eICU Interventions  PRN Zofran   11:00 PM Persistent nausea. [name] IV ordered once  Intervention Category Intermediate Interventions: Other:  [name] [name] 05/02/2023, 9:18 PM

## 2023-05-02 NOTE — Progress Notes (Signed)
PHARMACY - TOTAL PARENTERAL NUTRITION CONSULT NOTE  Indication: Gastrocolic fistula   Patient Measurements: Height: 6\' 3"  (190.5 cm) Weight: 67.7 kg (149 lb 4 oz) IBW/kg (Calculated) : 84.5 TPN AdjBW (KG): 53.2 Body mass index is 18.66 kg/m. 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  Patient presented on 7/19 with N/V x 4 days along with LLQ pain.  CT showed thickening of the distal transverse colon with complex fistulous connection between the transverse colon to the gastrojejunal anastomosis and adjacent small bowel loops.  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  Pharmacy consulted to manage TPN.  Patient was started on CLD on 7/22 and reported fullness/abdominal pain after eating.  He reports not eating for a week PTA.  He does not weigh himself, but he knows he has lost a lot of weight; his pants size reduced from a 36 to a 28 over a 6 month time frame.  He typically eats 2 meals a day (lunch and dinner) and he eats protein, vegetables and carb; he rarely eats fruits. Pt emergently intubated 7/27 and made NPO status. Pt self-extubated on 7/30 AM, but required emergent reintubation on 7/31 around midnight.  RD performed indirect calorimetry on 8/1 and determined patients caloric needs to be ~1900 kcal/day.   Glucose / Insulin: no hx DM. BG <180 on D10 @50ml /hr. Used 4 units SSI/24hr    7/31 - significant hypoglycemia on cyclic TPN   8/1 - insulin removed from TPN and BG stablized Electrolytes: Na 150, K 3.9 (received 80 mEq IV KCl), CO2 34, CoCa 10.8, Mag 2.3 (received 2g IV). all others WNL Renal: SCr < 1, BUN 26 Hepatic:  AST up 69, alk phos/ALT/Tbili wnl, TG 149, albumin 1.8 Intake / Output; MIVF: D10 @50ml /hr. UOP 3.7 ml/kg/hr on furosemide IV 60mg  Q12hr. Net +8L. LBM 7/26 GI Imaging:  7/25 CT Abd: severe fatty liver, thickened  appearance of colon GI Surgeries / Procedures: none since TPN initiation  Central access: PICC placed 04/21/23 TPN start date: 04/22/23  Nutritional Goals: Goal concentrated TPN rate is 65 mL/hr (provides 117g of protein and 1905 kcals per day)    RD Estimated Needs Total Energy Estimated Needs: 1900-2000 kcals via IC Total Protein Estimated Needs: 110-125 g Total Fluid Estimated Needs: 1.8 L  Current Nutrition:  NPO + TPN 8/2 @1300 - propofol off   Plan:  Increase lipids to goal, continue concentrated TPN at goal rate 65 ml/hr, meeting 100% of estimated needs Electrolytes in TPN: Decrease Na 25 mEq/L, K 40 mEq/L (126 mEq in bag), decrease Ca 2 mEq/L, increase Mag 10 mEq/L, Phos 25 mmol/L, change Cl:Ac to max Cl (unable to do 2:1 with current electrolytes) Add daily multivitamin and trace elements in TPN Decrease sensitive SSI and CBG checks to q6h, stop if not needing Stop D10 at 28m/hr  Monitor diuresis plan and adjust K as needed Standard TPN labs every Mon/Thurs and as needed   Thank you for allowing pharmacy to be a part of this patient's care.  Alphia Moh, PharmD, BCPS, BCCP Clinical Pharmacist  Please check AMION for all [facility] phone numbers After 10:00 PM, call Main Pharmacy [phone]

## 2023-05-02 NOTE — Evaluation (Signed)
Physical Therapy Evaluation Patient Details Name: Brent Rogers MRN: [id number] DOB: 1966/04/21 Today's Date: 05/02/2023  History of Present Illness  Brent Rogers is a 57 y.o. 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 PMH: hypertension, GERD, PUD, CAD, depression, anxiety, chronic pain, anemia, status post partial pancreatectomy, status post cholecystectomy, ETOH abuse   Clinical Impression  Pt admitted with above. Pt unfortunately had to be reintubated during hospital stay however was since extubated. Pt presenting with both impaired cognition and functional mobility requiring maxAX2 for OOB mobility. Pt to benefit from rehab program <3 hrs/day to achieve safe mod I level of function prior to transition home alone. Acute PT to cont to follow.        If plan is discharge home, recommend the following: A lot of help with walking and/or transfers;A lot of help with bathing/dressing/bathroom;Assist for transportation;Help with stairs or ramp for entrance   Can travel by private vehicle   No    Equipment Recommendations  (TBD at next venue, most likely will need a walker)  Recommendations for Other Services  OT consult    Functional Status Assessment Patient has had a recent decline in their functional status and/or demonstrates limited ability to make significant improvements in function in a reasonable and predictable amount of time     Precautions / Restrictions Precautions Precautions: Fall Precaution Comments: edema t/o UEs and LEs Restrictions Weight Bearing Restrictions: No      Mobility  Bed Mobility Overal bed mobility: Needs Assistance Bed Mobility: Supine to Sit     Supine to sit: Max assist, HOB elevated     General bed mobility comments: assist for LE management off EOB, maxA to scoot hips to EOB, HOB elevated at 45 deg, increased time, noted SOB, VSS     Transfers Overall transfer level: Needs assistance Equipment used: 2 person hand held assist (face to face transfer with gait belt and bed pad) Transfers: Sit to/from Stand, Bed to chair/wheelchair/BSC Sit to Stand: Max assist, +2 physical assistance   Step pivot transfers: Max assist, +2 physical assistance       General transfer comment: pt unable to achieve full upright standing, maintained trunk flexed. Pt required 2 sit to stands to get to chair. Pt very fatigued, VSS, SpO2 >90%    Ambulation/Gait               General Gait Details: unable this date  Stairs            Wheelchair Mobility     Tilt Bed    Modified Rankin (Stroke Patients Only)       Balance Overall balance assessment: Needs assistance Sitting-balance support: Feet supported, Single extremity supported Sitting balance-Leahy Scale: Fair     Standing balance support: Bilateral upper extremity supported, During functional activity, Reliant on assistive device for balance Standing balance-Leahy Scale: Zero Standing balance comment: dependent on external support                             Pertinent Vitals/Pain Pain Assessment Pain Assessment: Faces Faces Pain Scale: Hurts little more Pain Location: generalized with movement    Home Living Family/patient expects to be discharged to:: Private residence Living Arrangements: Alone Available Help at Discharge: Family;Available PRN/intermittently Type of Home: Apartment Home Access: Stairs to enter Entrance Stairs-Rails: Right;Left;Can reach both Entrance Stairs-Number of  Steps: 5   Home Layout: One level Home Equipment: Grab bars - tub/shower      Prior Function Prior Level of Function : Independent/Modified Independent             Mobility Comments: denies using AD, states he doesn't work as he is on disability ADLs Comments: indep     Hand Dominance   Dominant Hand: Right    Extremity/Trunk Assessment    Upper Extremity Assessment Upper Extremity Assessment: Generalized weakness (edema t/o bilat UEs)    Lower Extremity Assessment Lower Extremity Assessment: Generalized weakness (bilat LE edema)    Cervical / Trunk Assessment Cervical / Trunk Assessment: Normal  Communication   Communication:  (soft spoken)  Cognition Arousal/Alertness: Awake/alert Behavior During Therapy: Flat affect, Anxious, Restless Overall Cognitive Status: No family/caregiver present to determine baseline cognitive functioning                                 General Comments: pt with decreased insight to deficits and safety. Pt easily frustrated/irritated but then quickly changes attitude to particpate in therapy.        General Comments General comments (skin integrity, edema, etc.): bilat UE and LE edema, requiring 10L HFNC O2    Exercises     Assessment/Plan    PT Assessment Patient needs continued PT services  PT Problem List Decreased strength;Decreased activity tolerance;Decreased mobility;Decreased balance;Decreased knowledge of use of DME;Decreased safety awareness;Cardiopulmonary status limiting activity       PT Treatment Interventions DME instruction;Gait training;Stair training;Functional mobility training;Therapeutic activities;Therapeutic exercise    PT Goals (Current goals can be found in the Care Plan section)  Acute Rehab PT Goals Patient Stated Goal: home PT Goal Formulation: With patient Time For Goal Achievement: 05/16/23 Potential to Achieve Goals: Good    Frequency Min 1X/week     Co-evaluation               AM-PAC PT "6 Clicks" Mobility  Outcome Measure Help needed turning from your back to your side while in a flat bed without using bedrails?: A Lot Help needed moving from lying on your back to sitting on the side of a flat bed without using bedrails?: A Lot Help needed moving to and from a bed to a chair (including a wheelchair)?: A Lot Help  needed standing up from a chair using your arms (e.g., wheelchair or bedside chair)?: A Lot Help needed to walk in hospital room?: Total Help needed climbing 3-5 steps with a railing? : Total 6 Click Score: 10    End of Session Equipment Utilized During Treatment: Gait belt;Oxygen Activity Tolerance: Patient limited by fatigue Patient left: in bed;with call bell/phone within reach;with chair alarm set;with nursing/sitter in room Nurse Communication: Mobility status (present and assisted with transfer) PT Visit Diagnosis: Unsteadiness on feet (R26.81);Muscle weakness (generalized) (M62.81);Difficulty in walking, not elsewhere classified (R26.2)    Time: 4742-5956 PT Time Calculation (min) (ACUTE ONLY): 26 min   Charges:   PT Evaluation $PT Eval Moderate Complexity: 1 Mod PT Treatments $Therapeutic Activity: 8-22 mins PT General Charges $$ ACUTE PT VISIT: 1 Visit         Lewis Shock, PT, DPT Acute Rehabilitation Services Secure chat preferred Office #: [phone]   Iona Hansen 05/02/2023, 3:43 PM

## 2023-05-02 NOTE — Procedures (Addendum)
Extubation Procedure Note  Patient Details:   Name: Brent Rogers DOB: 1966/03/14 MRN: [id number]   Airway Documentation:    Vent end date: 05/02/23 Vent end time: 0900   Evaluation  O2 sats: currently acceptable Complications: No apparent complications Patient did tolerate procedure well. Bilateral Breath Sounds: Clear   Yes, pt able to cough to clear secretions and vocalized named. Initially placed on 4L nasal cannula and pt desat to 84. RT placed pt on 10L [place] and pt now tolerating better, with sats of 95. Pt positive for cuff leak prior to extubation.  Tacy Learn 05/02/2023, 9:05 AM

## 2023-05-02 NOTE — Progress Notes (Signed)
eLink Physician-Brief Progress Note Patient Name: Brent Rogers DOB: 1966-03-18 MRN: [id number]   Date of Service  05/02/2023  HPI/Events of Note  Mg 1.3 Added AM BMET  eICU Interventions  Replete Initiate elink electrolyte protocol     Intervention Category Intermediate Interventions: Electrolyte abnormality - evaluation and management  Brent Rogers 05/02/2023, 5:30 AM

## 2023-05-03 ENCOUNTER — Inpatient Hospital Stay (HOSPITAL_COMMUNITY): Payer: BLUE CROSS/BLUE SHIELD

## 2023-05-03 DIAGNOSIS — J9601 Acute respiratory failure with hypoxia: Secondary | ICD-10-CM | POA: Diagnosis not present

## 2023-05-03 DIAGNOSIS — K529 Noninfective gastroenteritis and colitis, unspecified: Secondary | ICD-10-CM | POA: Diagnosis not present

## 2023-05-03 DIAGNOSIS — N3001 Acute cystitis with hematuria: Secondary | ICD-10-CM | POA: Diagnosis not present

## 2023-05-03 LAB — CBC
HCT: 25.1 % — ABNORMAL LOW (ref 39.0–52.0)
HCT: 24.6 % — ABNORMAL LOW (ref 39.0–52.0)
Hemoglobin: 8.2 g/dL — ABNORMAL LOW (ref 13.0–17.0)
Hemoglobin: 7.9 g/dL — ABNORMAL LOW (ref 13.0–17.0)
MCH: 31.3 pg (ref 26.0–34.0)
MCH: 30.9 pg (ref 26.0–34.0)
MCHC: 32.7 g/dL (ref 30.0–36.0)
MCHC: 32.1 g/dL (ref 30.0–36.0)
MCV: 95.8 fL (ref 80.0–100.0)
MCV: 96.1 fL (ref 80.0–100.0)
Platelets: 48 10*3/uL — ABNORMAL LOW (ref 150–400)
Platelets: 50 10*3/uL — ABNORMAL LOW (ref 150–400)
RBC: 2.62 MIL/uL — ABNORMAL LOW (ref 4.22–5.81)
RBC: 2.56 MIL/uL — ABNORMAL LOW (ref 4.22–5.81)
RDW: 23.4 % — ABNORMAL HIGH (ref 11.5–15.5)
RDW: 23.4 % — ABNORMAL HIGH (ref 11.5–15.5)
WBC: 15.9 10*3/uL — ABNORMAL HIGH (ref 4.0–10.5)
WBC: 16.1 10*3/uL — ABNORMAL HIGH (ref 4.0–10.5)
nRBC: 0.9 % — ABNORMAL HIGH (ref 0.0–0.2)
nRBC: 1.1 % — ABNORMAL HIGH (ref 0.0–0.2)

## 2023-05-03 LAB — BASIC METABOLIC PANEL WITH GFR
Anion gap: 13 (ref 5–15)
BUN: 31 mg/dL — ABNORMAL HIGH (ref 6–20)
CO2: 27 mmol/L (ref 22–32)
Calcium: 7.6 mg/dL — ABNORMAL LOW (ref 8.9–10.3)
Chloride: 110 mmol/L (ref 98–111)
Creatinine, Ser: 0.78 mg/dL (ref 0.61–1.24)
GFR, Estimated: >60 mL/min (ref >60)
Glucose, Bld: 156 mg/dL — ABNORMAL HIGH (ref 70–99)
Potassium: 4.4 mmol/L (ref 3.5–5.1)
Sodium: 150 mmol/L — ABNORMAL HIGH (ref 135–145)

## 2023-05-03 LAB — GLUCOSE, CAPILLARY
Glucose-Capillary: 150 mg/dL — ABNORMAL HIGH (ref 70–99)
Glucose-Capillary: 154 mg/dL — ABNORMAL HIGH (ref 70–99)
Glucose-Capillary: 160 mg/dL — ABNORMAL HIGH (ref 70–99)

## 2023-05-03 LAB — MAGNESIUM: Magnesium: 1.8 mg/dL (ref 1.7–2.4)

## 2023-05-03 MED ORDER — AMLODIPINE BESYLATE 5 MG PO TABS: 5.0000 mg | ORAL_TABLET | Freq: Every day | ORAL | Status: DC

## 2023-05-03 MED ORDER — TRACE MINERALS CU-MN-SE-ZN 300-55-60-3000 MCG/ML IV SOLN
INTRAVENOUS | Status: AC
  Filled 2023-05-03: qty 821.6

## 2023-05-03 MED ORDER — METOCLOPRAMIDE HCL 5 MG/ML IJ SOLN
10.0000 mg | Freq: Four times a day (QID) | INTRAMUSCULAR | Status: DC | PRN
  Administered 2023-05-03 – 2023-05-19 (×8): 10 mg via INTRAVENOUS
  Filled 2023-05-03 (×8): qty 2

## 2023-05-03 MED ORDER — HYDRALAZINE HCL 20 MG/ML IJ SOLN
10.0000 mg | Freq: Once | INTRAMUSCULAR | Status: DC
Start: 2023-05-03 — End: 2023-05-03

## 2023-05-03 MED ORDER — FUROSEMIDE 10 MG/ML IJ SOLN
20.0000 mg | Freq: Once | INTRAMUSCULAR | Status: AC
  Administered 2023-05-03: 20 mg via INTRAVENOUS
  Filled 2023-05-03: qty 2

## 2023-05-03 MED ORDER — MAGNESIUM SULFATE 2 GM/50ML IV SOLN
2.0000 g | Freq: Once | INTRAVENOUS | Status: AC
  Administered 2023-05-03: 2 g via INTRAVENOUS
  Filled 2023-05-03: qty 50

## 2023-05-03 MED ORDER — MELATONIN 5 MG PO TABS: 5.0000 mg | ORAL_TABLET | Freq: Every day | ORAL | Status: DC

## 2023-05-03 MED ORDER — SODIUM CHLORIDE 0.9 % IV SOLN
1.0000 g | Freq: Three times a day (TID) | INTRAVENOUS | Status: AC
  Administered 2023-05-03 – 2023-05-09 (×20): 1 g via INTRAVENOUS
  Filled 2023-05-03 (×20): qty 20

## 2023-05-03 MED ORDER — METOCLOPRAMIDE HCL 10 MG PO TABS: 10.0000 mg | ORAL_TABLET | Freq: Four times a day (QID) | ORAL | Status: DC | PRN

## 2023-05-03 NOTE — Consult Note (Signed)
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 Two partial thickness wounds on the forefoot of each foot at first and 4th-th metatarsal heads. Wound type:neuropathic Pressure Injury POA: N/A Measurement: Small circular wounds each less than 0.5cm round with minimal depth.  Wound AOZ:HYQM, dry. See photodocumentation provided By Dr. Merrily Pew today. Drainage (amount, consistency, odor) none Periwound: Toenails are elongated, unkept with poor hygiene.Skin is dry Dressing procedure/placement/frequency: I have provided daily care orders for the feet using a soap and water cleanse dry and application of Eucerin cream to all areas except the interdigital spaces. Dry gauze is to be placed between the digits with close approximation and heels floated with the use of Prevalon boots.  Post discharge, consultation with Podiatric Medicine for toenail debridement and routine foot evaluation and POC oversight is recommended.   WOC nursing team will not follow, but will remain available to this patient, the nursing and medical teams.  Please re-consult if needed.  Thank you for inviting [place] to participate in this patient's Plan of Care.  Ladona Mow, MSN, RN, CNS, GNP, Leda Min, [employer], [employer] phone:  [phone]

## 2023-05-03 NOTE — Plan of Care (Signed)
  Problem: Education: Goal: Knowledge of General Education information will improve Description: Including pain rating scale, medication(s)/side effects and non-pharmacologic comfort measures Outcome: Progressing   Problem: Health Behavior/Discharge Planning: Goal: Ability to manage health-related needs will improve Outcome: Not Progressing   Problem: Clinical Measurements: Goal: Ability to maintain clinical measurements within normal limits will improve Outcome: Progressing Goal: Will remain free from infection Outcome: Progressing Goal: Diagnostic test results will improve Outcome: Progressing Goal: Respiratory complications will improve Outcome: Progressing Goal: Cardiovascular complication will be avoided Outcome: Progressing   Problem: Activity: Goal: Risk for activity intolerance will decrease Outcome: Not Progressing   Problem: Nutrition: Goal: Adequate nutrition will be maintained Outcome: Progressing   Problem: Coping: Goal: Level of anxiety will decrease Outcome: Progressing   Problem: Elimination: Goal: Will not experience complications related to bowel motility Outcome: Not Progressing Goal: Will not experience complications related to urinary retention Outcome: Progressing   Problem: Pain Managment: Goal: General experience of comfort will improve Outcome: Not Progressing   Problem: Safety: Goal: Ability to remain free from injury will improve Outcome: Progressing   Problem: Skin Integrity: Goal: Risk for impaired skin integrity will decrease Outcome: Progressing   Problem: Activity: Goal: Ability to tolerate increased activity will improve Outcome: Not Progressing   Problem: Respiratory: Goal: Ability to maintain a clear airway and adequate ventilation will improve Outcome: Progressing   Problem: Role Relationship: Goal: Method of communication will improve Outcome: Progressing   Problem: Safety: Goal: Non-violent Restraint(s) Outcome:  Not Applicable

## 2023-05-03 NOTE — Progress Notes (Addendum)
Pharmacy Antibiotic Note  Brent Rogers is a 57 y.o. male admitted on 04/17/2023 with pneumonia.  Pharmacy has been consulted for meropenem dosing.  After discussion with Dr. Merrily Pew, antibiotics for treatment of pneumonia will be started. Patient was recently extubated and has increased purulent sputum production with agitating cough. Patient remains afebrile and WBC is trending down (previously elevated due to short course of high-dose steroids). Patient recently treated with full courses of antibiotics for pneumonia. Will restart empiric coverage for HCAP with meropenem. Initial treatment plan to treat for 7 days.  Plan: Start meropenem 1000 mg every 8 hours Monitor fever, WBC, O2 requirements, and sputum characteristics Monitor renal function for appropriate dosing.  Height: 6\' 3"  (190.5 cm) Weight: 65.5 kg (144 lb 6.4 oz) IBW/kg (Calculated) : 84.5  Temp (24hrs), Avg:98.3 F (36.8 C), Min:98 F (36.7 C), Max:99 F (37.2 C)  Recent Labs  Lab 04/30/23 0402 04/30/23 1656 05/01/23 0349 05/01/23 1714 05/02/23 0352 05/02/23 0806 05/02/23 1601 05/03/23 0531 05/03/23 0651  WBC 16.4*  --  11.1*  --  7.7  --  13.4*  --  15.9*  CREATININE 0.48* 0.49* 0.58* 0.57*  --  0.59*  --  0.78  --     Estimated Creatinine Clearance: 94.4 mL/min (by C-G formula based on SCr of 0.78 mg/dL).    No Known Allergies  Antimicrobials this admission: Meropenem 8/4 >>  Zosyn 7/25 >> 7/30 Vancomycin 7/26 >> 7/27 Micafungin 7/26 >> 7/27 Ceftriaxone 7/19 >> 7/25 Metronidazole 7/19 >> 7/25  Dose adjustments this admission: None, will continue to monitor renal function  Microbiology results: 7/20 Ucx >100k kleb pneumo (S to CTX) 7/25 BCx - NGTD 7/27 TA - no growth  Thank you for allowing pharmacy to be a part of this patient's care.  Wilmer Floor 05/03/2023 1:44 PM

## 2023-05-03 NOTE — Progress Notes (Signed)
PHARMACY - TOTAL PARENTERAL NUTRITION CONSULT NOTE  Indication: Gastrocolic fistula   Patient Measurements: Height: 6\' 3"  (190.5 cm) Weight: 65.5 kg (144 lb 6.4 oz) IBW/kg (Calculated) : 84.5 TPN AdjBW (KG): 53.2 Body mass index is 18.05 kg/m. 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  Patient presented on 7/19 with N/V x 4 days along with LLQ pain.  CT showed thickening of the distal transverse colon with complex fistulous connection between the transverse colon to the gastrojejunal anastomosis and adjacent small bowel loops.  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  Pharmacy consulted to manage TPN.  Patient was started on CLD on 7/22 and reported fullness/abdominal pain after eating.  He reports not eating for a week PTA.  He does not weigh himself, but he knows he has lost a lot of weight; his pants size reduced from a 36 to a 28 over a 6 month time frame.  He typically eats 2 meals a day (lunch and dinner) and he eats protein, vegetables and carb; he rarely eats fruits. Pt emergently intubated 7/27 and made NPO status. Pt self-extubated on 7/30 AM, but required emergent reintubation on 7/31 around midnight.  RD performed indirect calorimetry on 8/1 and determined patients caloric needs to be ~1900 kcal/day.   Glucose / Insulin: no hx DM. BG <180 on D10 @50ml /hr. Used 6 units SSI/24hr    7/31 - significant hypoglycemia on cyclic TPN   8/1 - insulin removed from TPN and BG stablized Electrolytes: Na 150, K 4.4 (received 40 mEq IV KCl) CoCa 9.36, Mag 1.8 (received 2g IV). all others WNL Renal: SCr 0.78, BUN 31 Hepatic:  AST 69, alk phos/ALT/Tbili wnl, TG 149, albumin 1.8 Intake / Output; MIVF: UOP 2.9 ml/kg/hr on furosemide IV 40mg  Q12hr. Net +2L  GI Imaging:  7/25 CT Abd: severe fatty liver, thickened appearance of colon GI  Surgeries / Procedures: none since TPN initiation  Central access: PICC placed 04/21/23 TPN start date: 04/22/23  Nutritional Goals: Goal concentrated TPN rate is 65 mL/hr (provides 123g of protein and 1930 kcals per day)    RD Estimated Needs Total Energy Estimated Needs: 1900-2000 kcals via IC Total Protein Estimated Needs: 110-125 g Total Fluid Estimated Needs: 1.8 L  Current Nutrition:  NPO + TPN 8/2 @1300 - propofol off   Plan:  Continue concentrated TPN at goal rate 65 ml/hr, meeting 100% of estimated needs Electrolytes in TPN: Decrease Na 0 mEq/L, K 40 mEq/L (125 mEq in bag), increase Ca 4 mEq/L, increase Mag 15 mEq/L (Max), Phos 25 mmol/L, Cl:Ac to max Cl (unable to do 2:1 with current electrolytes) Add daily multivitamin and trace elements in TPN Stop sensitive SSI and decrease CBG checks to q12hr  Monitor diuresis plan and adjust K as needed Standard TPN labs every Mon/Thurs and as needed   Thank you for allowing pharmacy to be a part of this patient's care.  Alphia Moh, PharmD, BCPS, BCCP Clinical Pharmacist  Please check AMION for all [facility] Pharmacy phone numbers After 10:00 PM, call Main Pharmacy [phone]

## 2023-05-03 NOTE — Progress Notes (Signed)
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  Attempting conservative management and TPN to try to help nutritional status.  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  Denies any frank aspiration, breathing worsened after a BM.  CXR with worsening bilateral infiltrates. After discussion with rapid response and crosscover TRH, PCCM consulted to assist with management.  Patient states breathing is stable from this am, has not been able to sleep due to anxiety over SOB.  2022 echo normal  Pertinent  Medical History  Chronic pancreatitis Prior Whipple Severe protein calorie malnutrition Question of alcohol abuse but multiple previous notes state his use is modest at best.  I do see a level of 134 back in 2021. HTN  Significant Hospital Events: Including procedures, antibiotic start and stop dates in addition to other pertinent events   7/19 admit 7/26 thoracentesis with 800 cc of fluid obtained 7/29 tolerating SBT 7/30 self extubated during SBT. 7/31 reintubated for increased respiratory distress. 8/1 failed SBT, significant agitation  8/2 Failed SBT again this am due to agitation, remains on fentanyl and propofol   Interim History / Subjective:  Patient was extubated yesterday Remains on high flow nasal cannula oxygen Continue to have cough with thick sputum Remain afebrile  Objective   Blood pressure (!) 141/95, pulse (!) 114, temperature 98.2 F (36.8 C), temperature source Oral, resp. rate (!) 27, height 6\' 3"  (1.905 m), weight 65.5 kg, SpO2 96%. CVP:  [0 mmHg-13 mmHg] 13 mmHg  FiO2 (%):  [40 %] 40 %    Intake/Output Summary (Last 24 hours) at 05/03/2023 0742 Last data filed at 05/03/2023 0700 Gross per 24 hour  Intake 2068.94 ml  Output 4625 ml  Net -2556.06 ml   Filed Weights   05/01/23 0500 05/02/23 0413 05/03/23 0346  Weight: 68 kg 67.7 kg 65.5 kg    Examination: General: Acute on chronically ill-appearing male, lying on the bed HEENT: [place]/AT, eyes anicteric.  moist mucus membranes Neuro: Alert, awake following commands Chest: Tachypneic, faint basal crackles bilaterally right more than left, no wheezes or rhonchi Heart: Tachycardic, regular rhythm, no murmurs or gallops Abdomen: Soft, nontender, nondistended, bowel sounds present Skin: No rash  Labs and images were reviewed  Resolved problems:  Septic shock  Severe leukemoid reaction  Hyponatremia  Hypokalemia  Hypophosphatemia Hypomagnesemia ARDS Acute urinary tract infection with Klebsiella  Assessment & Plan:  Acute hypoxic respiratory failure Recurrent aspiration pneumonia Right pleural effusion s/p thoracentesis Received Ceftriaxone 7/19-7/25, Flagyl 7/19-7/25, Zosyn 7/25-7/30, Vanc 7/26-7/28, and Micafungin 7/26-7/28 Patient was extubated yesterday Remains on high flow nasal cannula oxygen Continue to cough up thick sputum Repeat x-ray chest is suggestive of bilateral infiltrate Started back on meropenem Sputum culture has been sent White count is up from yesterday though remained afebrile He is tachycardic  Gastrocolic fistula, being manage conservatively for now Will likely need surgical repair once medically optimized  Acute colitis Pancreatic mass status post Whipple's 2015 Severe protein calorie malnutrition  Primary management per Surgery  Remain n.p.o. Continue TPN Per surgery  Supportive care  Optimize electrolytes   Hypernatremia, likely due to aggressive diuresis with albumin Will adjust TPN to correct  hypernatremia  Anemia of critical illness Thrombocytopenia in the setting of sepsis   Monitor H&H and platelet count Platelet count started improving  DVT of unknown duration Continue Lovenox  Best Practice (right click and "Reselect all SmartList Selections" daily)   Diet/type: TPN DVT prophylaxis: Therapeutic Lovenox GI prophylaxis: PPI Lines: Central line for TPN Foley:  N/A Code Status:  full code Last date of multidisciplinary goals of care discussion: 8/4: Patient was updated at bedside   The patient is critically ill due to acute respiratory failure with hypoxia.  Critical care was necessary to treat or prevent imminent or life-threatening deterioration.  Critical care was time spent personally by me on the following activities: development of treatment plan with patient and/or surrogate as well as nursing, discussions with consultants, evaluation of patient's response to treatment, examination of patient, obtaining history from patient or surrogate, ordering and performing treatments and interventions, ordering and review of laboratory studies, ordering and review of radiographic studies, pulse oximetry, re-evaluation of patient's condition and participation in multidisciplinary rounds.   During this encounter critical care time was devoted to patient care services described in this note for 36 minutes.     Cheri Fowler, MD [facility] See Amion for pager If no response to pager, please call [phone] until 7pm After 7pm, Please call E-link [phone]

## 2023-05-03 NOTE — Progress Notes (Signed)
eLink Physician-Brief Progress Note Patient Name: Brent Rogers DOB: [birth year] MRN: [id number]   Date of Service  05/03/2023  HPI/Events of Note  1)Contacted by bedside RN regarding BP 150/103. Patient has not slept due to nausea  2)Extubated yesterday  eICU Interventions  1)Continue hydralazine 10 mg IV PRN  2) DC IV sedation (cont Fentanyl, versed)     Intervention Category Intermediate Interventions: Hypertension - evaluation and management Evaluation Type: New Patient Evaluation  Aylen Rambert [name] 05/03/2023, 5:28 AM

## 2023-05-04 DIAGNOSIS — J9601 Acute respiratory failure with hypoxia: Secondary | ICD-10-CM | POA: Diagnosis not present

## 2023-05-04 DIAGNOSIS — N3001 Acute cystitis with hematuria: Secondary | ICD-10-CM | POA: Diagnosis not present

## 2023-05-04 DIAGNOSIS — K529 Noninfective gastroenteritis and colitis, unspecified: Secondary | ICD-10-CM | POA: Diagnosis not present

## 2023-05-04 LAB — CBC
HCT: 25.7 % — ABNORMAL LOW (ref 39.0–52.0)
Hemoglobin: 8 g/dL — ABNORMAL LOW (ref 13.0–17.0)
MCH: 30.1 pg (ref 26.0–34.0)
MCHC: 31.1 g/dL (ref 30.0–36.0)
MCV: 96.6 fL (ref 80.0–100.0)
Platelets: 50 10*3/uL — ABNORMAL LOW (ref 150–400)
RBC: 2.66 MIL/uL — ABNORMAL LOW (ref 4.22–5.81)
RDW: 23.9 % — ABNORMAL HIGH (ref 11.5–15.5)
WBC: 17.9 10*3/uL — ABNORMAL HIGH (ref 4.0–10.5)
nRBC: 1.1 % — ABNORMAL HIGH (ref 0.0–0.2)

## 2023-05-04 LAB — BASIC METABOLIC PANEL
Anion gap: 9 (ref 5–15)
BUN: 27 mg/dL — ABNORMAL HIGH (ref 6–20)
CO2: 24 mmol/L (ref 22–32)
Calcium: 8.4 mg/dL — ABNORMAL LOW (ref 8.9–10.3)
Chloride: 118 mmol/L — ABNORMAL HIGH (ref 98–111)
Creatinine, Ser: 0.66 mg/dL (ref 0.61–1.24)
GFR, Estimated: >60 mL/min (ref >60)
Glucose, Bld: 145 mg/dL — ABNORMAL HIGH (ref 70–99)
Potassium: 3.2 mmol/L — ABNORMAL LOW (ref 3.5–5.1)
Sodium: 151 mmol/L — ABNORMAL HIGH (ref 135–145)

## 2023-05-04 LAB — EXPECTORATED SPUTUM ASSESSMENT W GRAM STAIN, RFLX TO RESP C

## 2023-05-04 LAB — GLUCOSE, CAPILLARY
Glucose-Capillary: 142 mg/dL — ABNORMAL HIGH (ref 70–99)
Glucose-Capillary: 154 mg/dL — ABNORMAL HIGH (ref 70–99)

## 2023-05-04 LAB — HEPARIN INDUCED PLATELET AB (HIT ANTIBODY): Heparin Induced Plt Ab: 0.146 {OD_unit} (ref 0.000–0.400)

## 2023-05-04 MED ORDER — HYDROMORPHONE HCL 1 MG/ML IJ SOLN
0.5000 mg | Freq: Once | INTRAMUSCULAR | Status: AC
  Administered 2023-05-04: 0.5 mg via INTRAVENOUS
  Filled 2023-05-04: qty 0.5

## 2023-05-04 MED ORDER — MORPHINE SULFATE (PF) 2 MG/ML IV SOLN
1.0000 mg | Freq: Four times a day (QID) | INTRAVENOUS | Status: DC | PRN
  Administered 2023-05-04 – 2023-05-07 (×9): 1 mg via INTRAVENOUS
  Filled 2023-05-04 (×10): qty 1

## 2023-05-04 MED ORDER — BOOST / RESOURCE BREEZE PO LIQD CUSTOM
1.0000 | Freq: Three times a day (TID) | ORAL | Status: DC
  Administered 2023-05-04 – 2023-05-05 (×3): 1 via ORAL

## 2023-05-04 MED ORDER — POTASSIUM CHLORIDE 10 MEQ/50ML IV SOLN
10.0000 meq | INTRAVENOUS | Status: AC
  Administered 2023-05-04 (×6): 10 meq via INTRAVENOUS
  Filled 2023-05-04 (×6): qty 50

## 2023-05-04 MED ORDER — BISACODYL 10 MG RE SUPP
10.0000 mg | Freq: Once | RECTAL | Status: AC
  Administered 2023-05-04: 10 mg via RECTAL
  Filled 2023-05-04: qty 1

## 2023-05-04 MED ORDER — TRACE MINERALS CU-MN-SE-ZN 300-55-60-3000 MCG/ML IV SOLN
INTRAVENOUS | Status: AC
  Filled 2023-05-04: qty 821.6

## 2023-05-04 MED ORDER — POTASSIUM CHLORIDE 10 MEQ/50ML IV SOLN
10.0000 meq | INTRAVENOUS | Status: AC
  Administered 2023-05-04 – 2023-05-05 (×6): 10 meq via INTRAVENOUS
  Filled 2023-05-04 (×6): qty 50

## 2023-05-04 NOTE — Progress Notes (Addendum)
Progress Note     Subjective: Pt reports constipation. He has some nausea but really wants to be able to drink 2 apple juice containers. Abdominal pain generalized and feels secondary to constipation.   Objective: Vital signs in last 24 hours: Temp:  [98 F (36.7 C)-99.4 F (37.4 C)] 99.4 F (37.4 C) (08/05 0400) Pulse Rate:  [91-120] 92 (08/05 0700) Resp:  [22-41] 28 (08/05 0700) BP: (131-165)/(93-107) 149/100 (08/05 0700) SpO2:  [89 %-100 %] 96 % (08/05 0700) FiO2 (%):  [80 %-94 %] 94 % (08/05 0215) Weight:  [61.7 kg] 61.7 kg (08/05 0300) Last BM Date : 04/24/23  Intake/Output from previous day: 08/04 0701 - 08/05 0700 In: 2043.3 [I.V.:1690.7; IV Piggyback:352.6] Out: 4770 [Urine:4770] Intake/Output this shift: No intake/output data recorded.  PE: General: WD, cachectic male who is alert and in NAD Heart: RRR Lungs: HFNC Abd: soft, NT, ND   Lab Results:  Recent Labs    05/03/23 0651 05/03/23 1726  WBC 15.9* 16.1*  HGB 8.2* 7.9*  HCT 25.1* 24.6*  PLT 48* 50*   BMET Recent Labs    05/02/23 0806 05/03/23 0531  NA 150* 150*  K 3.9 4.4  CL 109 110  CO2 29 27  GLUCOSE 106* 156*  BUN 31* 31*  CREATININE 0.59* 0.78  CALCIUM 9.1 7.6*   PT/INR No results for input(s): "LABPROT", "INR" in the last 72 hours. CMP     Component Value Date/Time   NA 150 (H) 05/03/2023 0531   NA 140 06/22/2020 1648   K 4.4 05/03/2023 0531   CL 110 05/03/2023 0531   CO2 27 05/03/2023 0531   GLUCOSE 156 (H) 05/03/2023 0531   BUN 31 (H) 05/03/2023 0531   BUN 4 (L) 06/22/2020 1648   CREATININE 0.78 05/03/2023 0531   CREATININE 0.91 10/15/2015 1517   CALCIUM 7.6 (L) 05/03/2023 0531   PROT 4.7 (L) 04/30/2023 0402   PROT 6.2 06/22/2020 1648   ALBUMIN 1.8 (L) 04/30/2023 0402   ALBUMIN 3.5 (L) 06/22/2020 1648   AST 69 (H) 04/30/2023 0402   ALT 25 04/30/2023 0402   ALKPHOS 126 04/30/2023 0402   BILITOT 0.8 04/30/2023 0402   BILITOT 0.2 06/22/2020 1648   GFRNONAA >60  05/03/2023 0531   GFRNONAA >89 10/15/2015 1517   GFRAA 128 06/22/2020 1648   GFRAA >89 10/15/2015 1517   Lipase     Component Value Date/Time   LIPASE 17 04/17/2023 1110       Studies/Results: DG CHEST PORT 1 VIEW  Result Date: 05/03/2023 CLINICAL DATA:  Acute hypercapnic respiratory failure EXAM: PORTABLE CHEST 1 VIEW COMPARISON:  04/29/2023.  04/28/2023.  04/25/2023. FINDINGS: Endotracheal tube and nasogastric tube have been removed. Right arm PICC remains in place with the tip in the SVC above the right atrium. Lung volumes are slightly decreased following extubation. Bilateral interstitial and alveolar edema/airspace filling persists. Small amount of pleural fluid persists, left more than right. No new finding otherwise. IMPRESSION: Endotracheal tube and nasogastric tube removed. Slightly decreased lung volumes following extubation. Persistent edema/airspace filling and small effusions, left more than right. Electronically Signed   By: Paulina Fusi M.D.   On: 05/03/2023 10:06    Anti-infectives: Anti-infectives (From admission, onward)    Start     Dose/Rate Route Frequency Ordered Stop   05/03/23 1100  meropenem (MERREM) 1 g in sodium chloride 0.9 % 100 mL IVPB        1 g 200 mL/hr over 30 Minutes Intravenous Every 8  hours 05/03/23 1007     04/24/23 0300  micafungin (MYCAMINE) 100 mg in sodium chloride 0.9 % 100 mL IVPB  Status:  Discontinued        100 mg 105 mL/hr over 1 Hours Intravenous Daily 04/24/23 0108 04/26/23 0747   04/24/23 0200  vancomycin (VANCOCIN) IVPB 1000 mg/200 mL premix  Status:  Discontinued        1,000 mg 200 mL/hr over 60 Minutes Intravenous 2 times daily 04/24/23 0114 04/26/23 0747   04/23/23 1400  piperacillin-tazobactam (ZOSYN) IVPB 3.375 g  Status:  Discontinued        3.375 g 12.5 mL/hr over 240 Minutes Intravenous Every 8 hours 04/23/23 1138 04/28/23 0755   04/20/23 1200  cefTRIAXone (ROCEPHIN) 2 g in sodium chloride 0.9 % 100 mL IVPB  Status:   Discontinued        2 g 200 mL/hr over 30 Minutes Intravenous Every 24 hours 04/20/23 1133 04/23/23 1138   04/18/23 1600  cefTRIAXone (ROCEPHIN) 2 g in sodium chloride 0.9 % 100 mL IVPB  Status:  Discontinued        2 g 200 mL/hr over 30 Minutes Intravenous Every 24 hours 04/17/23 1807 04/20/23 1133   04/18/23 0500  metroNIDAZOLE (FLAGYL) IVPB 500 mg  Status:  Discontinued        500 mg 100 mL/hr over 60 Minutes Intravenous Every 12 hours 04/17/23 1807 04/23/23 1138   04/17/23 1600  cefTRIAXone (ROCEPHIN) 1 g in sodium chloride 0.9 % 100 mL IVPB        1 g 200 mL/hr over 30 Minutes Intravenous  Once 04/17/23 1550 04/17/23 1738   04/17/23 1600  metroNIDAZOLE (FLAGYL) IVPB 500 mg        500 mg 100 mL/hr over 60 Minutes Intravenous  Once 04/17/23 1550 04/17/23 1747        Assessment/Plan  Hx of Whipple in 2015 Possible colitis  GJ-colonic fistula Severe protein calorie malnutrition  - on TPN, reportedly no BM in 10 days - may need to give more aggressive regimen from below, mobilize as able  - GJ-colonic fistula will almost certainly not heal on its own, but nutrition and pulmonary status need to be better prior to surgical intervention. Will discuss with Dr. Donell Beers  - acute care surgical service will discuss with Dr. Donell Beers to figure out surgery this admission vs follow up pending nutrition and medical readiness  - labs pending this AM   Acute hypercarbic ventilator dependent respiratory failure - per CCM. Appreciate their care, on HFNC after extubation 8/3   FEN: TPN, cortrak for meds  VTE: therapeutic LMWH ID: merrem   - per CCM -    Pleural effusion s/p thora Severe leukemoid reaction  UTI with Klebsiella  Anemia of critical illness Thrombocytopenia - plts 50K yesterday  LOS: 16 days   I reviewed last 24 h vitals and pain scores, last 48 h intake and output, last 24 h labs and trends, and CCM notes .   Juliet Rude, [facility] Surgery 05/04/2023, 8:02  AM Please see Amion for pager number during day hours 7:00am-4:30pm

## 2023-05-04 NOTE — Progress Notes (Signed)
NAME:  Brent Rogers, MRN:  [id number], DOB:  Aug 13, 1966, LOS: 16 ADMISSION DATE:  04/17/2023, CONSULTATION DATE:  04/24/23 REFERRING MD:  Rapid and TRH crosscover, CHIEF COMPLAINT:  SOB   History of Present Illness:  57 year old man w/ hx of alcohol use, PUD, chronic pancreatitis, prior pancreatectomy and cholecystectomy p/w abd pain found to have gastrocolonic fistula.  Attempting conservative management and TPN to try to help nutritional status.  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  Denies any frank aspiration, breathing worsened after a BM.  CXR with worsening bilateral infiltrates. After discussion with rapid response and crosscover TRH, PCCM consulted to assist with management.  Patient states breathing is stable from this am, has not been able to sleep due to anxiety over SOB.  2022 echo normal  Pertinent  Medical History  Chronic pancreatitis Prior Whipple Severe protein calorie malnutrition Question of alcohol abuse but multiple previous notes state his use is modest at best.  I do see a level of 134 back in 2021. HTN  Significant Hospital Events: Including procedures, antibiotic start and stop dates in addition to other pertinent events   7/19 admit 7/26 thoracentesis with 800 cc of fluid obtained 7/29 tolerating SBT 7/30 self extubated during SBT. 7/31 reintubated for increased respiratory distress. 8/1 failed SBT, significant agitation  8/2 Failed SBT again this am due to agitation, remains on fentanyl and propofol   Interim History / Subjective:  Overnight complained of abdominal pain bilateral foot pain, received Dilaudid Continue to complain of nausea Stated did not have bowel movement for 10 days Remains on high flow nasal cannula oxygen at 30 L and 60%  Objective   Blood pressure (!) 149/100, pulse 92, temperature 99.4 F (37.4 C), temperature source Oral, resp. rate (!) 28, height 6\' 3"  (1.905 m), weight  61.7 kg, SpO2 96%. CVP:  [8 mmHg-14 mmHg] 9 mmHg  FiO2 (%):  [80 %-94 %] 94 %   Intake/Output Summary (Last 24 hours) at 05/04/2023 0727 Last data filed at 05/04/2023 0700 Gross per 24 hour  Intake 2043.28 ml  Output 4770 ml  Net -2726.72 ml   Filed Weights   05/02/23 0413 05/03/23 0346 05/04/23 0300  Weight: 67.7 kg 65.5 kg 61.7 kg    Examination: General: Acute on chronically ill-appearing male, lying on the bed HEENT: [place]/AT, eyes anicteric.  moist mucus membranes.  On heated high flow nasal cannula oxygen Neuro: Alert, awake following commands Chest: Fine crackles bilaterally, no wheezes or rhonchi Heart: Tachycardic, regular rhythm, no murmurs or gallops Abdomen: Soft, nontender, nondistended, bowel sounds present Skin: No rash  Labs and images were reviewed  Resolved problems:  Septic shock  Severe leukemoid reaction  Hyponatremia  Hypokalemia  Hypophosphatemia Hypomagnesemia ARDS Acute urinary tract infection with Klebsiella Right pleural effusion s/p thoracentesis  Assessment & Plan:  Acute hypoxic respiratory failure Recurrent aspiration pneumonia Received Ceftriaxone 7/19-7/25, Flagyl 7/19-7/25, Zosyn 7/25-7/30, Vanc 7/26-7/28, and Micafungin 7/26-7/28 Patient was extubated on 8/3 Continue to remain on high flow nasal cannula oxygen, currently at 30 L and 60% Continue to cough up thick sputum Repeat chest x-ray showing bilateral lower lobe infiltrates Continue meropenem Sputum culture is in process White count is up from yesterday though remained afebrile  Gastrocolic fistula, being manage conservatively for now Will likely need surgical repair once medically optimized  Acute colitis Pancreatic mass status post Whipple's 2015 Severe protein calorie malnutrition  Primary management per Surgery  Remain n.p.o. Continue TPN Per  surgery  Supportive care  Optimize electrolytes   Obstipation Patient last bowel movement was on 7/26 With an aggressive bowel  regimen  Hypernatremia, likely due to aggressive diuresis with albumin TPN being adjusted  Anemia of critical illness Thrombocytopenia in the setting of sepsis  Monitor H&H and platelet count Platelet count started improving  DVT of unknown duration Continue Lovenox  Best Practice (right click and "Reselect all SmartList Selections" daily)   Diet/type: TPN DVT prophylaxis: Therapeutic Lovenox GI prophylaxis: PPI Lines: Central line for TPN Foley:  N/A Code Status:  full code Last date of multidisciplinary goals of care discussion: 8/4: Patient was updated at bedside   The patient is critically ill due to acute respiratory failure with hypoxia.  Critical care was necessary to treat or prevent imminent or life-threatening deterioration.  Critical care was time spent personally by me on the following activities: development of treatment plan with patient and/or surrogate as well as nursing, discussions with consultants, evaluation of patient's response to treatment, examination of patient, obtaining history from patient or surrogate, ordering and performing treatments and interventions, ordering and review of laboratory studies, ordering and review of radiographic studies, pulse oximetry, re-evaluation of patient's condition and participation in multidisciplinary rounds.   During this encounter critical care time was devoted to patient care services described in this note for 32 minutes.     Cheri Fowler, MD [facility] See Amion for pager If no response to pager, please call [phone] until 7pm After 7pm, Please call E-link [phone]

## 2023-05-04 NOTE — Evaluation (Signed)
Occupational Therapy Evaluation Patient Details Name: Brent Rogers MRN: [id number] DOB: Mar 13, 1966 Today's Date: 05/04/2023   History of Present Illness Brent Rogers is a 57 y.o. 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 PMH: hypertension, GERD, PUD, CAD, depression, anxiety, chronic pain, anemia, status post partial pancreatectomy, status post cholecystectomy, ETOH abuse   Clinical Impression   Pt ind at baseline with ADLs/functional mobility, lives alone. Pt currently needing set up-max A for ADLs, mod A+2 for bed mobility and max A +2 for squat pivot transfer x2 to chair. SpO2 down to 88% at lowest on 20L HFNC. Pt presenting with impairments listed below, will follow acutely. Patient will benefit from continued inpatient follow up therapy, <3 hours/day to maximize safety/ind with ADLs/functional mobility.       Recommendations for follow up therapy are one component of a multi-disciplinary discharge planning process, led by the attending physician.  Recommendations may be updated based on patient status, additional functional criteria and insurance authorization.   Assistance Recommended at Discharge Frequent or constant Supervision/Assistance  Patient can return home with the following Two people to help with walking and/or transfers;A lot of help with bathing/dressing/bathroom;Assistance with cooking/housework;Direct supervision/assist for medications management;Direct supervision/assist for financial management;Assist for transportation;Help with stairs or ramp for entrance    Functional Status Assessment  Patient has had a recent decline in their functional status and demonstrates the ability to make significant improvements in function in a reasonable and predictable amount of time.  Equipment Recommendations  Other (comment) (defer)    Recommendations for Other Services PT  consult     Precautions / Restrictions Precautions Precautions: Fall Precaution Comments: edema t/o UEs and LEs Restrictions Weight Bearing Restrictions: No      Mobility Bed Mobility Overal bed mobility: Needs Assistance Bed Mobility: Supine to Sit     Supine to sit: Mod assist, +2 for physical assistance     General bed mobility comments: assist for BLEs and trunk elevation    Transfers Overall transfer level: Needs assistance Equipment used: 2 person hand held assist Transfers: Sit to/from Stand, Bed to chair/wheelchair/BSC Sit to Stand: Max assist, +2 physical assistance     Step pivot transfers: Max assist, +2 physical assistance     General transfer comment: 2 small pivot transfers due to pt unable to come to full upright position      Balance Overall balance assessment: Needs assistance Sitting-balance support: Feet supported, Single extremity supported Sitting balance-Leahy Scale: Fair     Standing balance support: Bilateral upper extremity supported, During functional activity, Reliant on assistive device for balance Standing balance-Leahy Scale: Zero Standing balance comment: dependent on external support                           ADL either performed or assessed with clinical judgement   ADL Overall ADL's : Needs assistance/impaired Eating/Feeding: Set up;Sitting   Grooming: Set up;Sitting;Wash/dry face   Upper Body Bathing: Minimal assistance;Sitting   Lower Body Bathing: Maximal assistance;Sitting/lateral leans   Upper Body Dressing : Minimal assistance;Sitting   Lower Body Dressing: Maximal assistance;Sitting/lateral leans   Toilet Transfer: Maximal assistance;+2 for physical assistance;Stand-pivot;Squat-pivot;BSC/3in1   Toileting- Clothing Manipulation and Hygiene: Maximal assistance       Functional mobility during ADLs: Maximal assistance;+2 for physical assistance       Vision   Vision Assessment?: No apparent  visual deficits     Perception Perception Perception Tested?: No   Praxis Praxis Praxis tested?: Not tested    Pertinent Vitals/Pain Pain Assessment Pain Assessment: Faces Pain Score: 4  Faces Pain Scale: Hurts little more Pain Location: bil feet Pain Descriptors / Indicators: Discomfort Pain Intervention(s): Limited activity within patient's tolerance, Monitored during session, Repositioned     Hand Dominance Right   Extremity/Trunk Assessment Upper Extremity Assessment Upper Extremity Assessment: Generalized weakness   Lower Extremity Assessment Lower Extremity Assessment: Defer to PT evaluation   Cervical / Trunk Assessment Cervical / Trunk Assessment: Normal   Communication Communication Communication: No difficulties   Cognition Arousal/Alertness: Awake/alert Behavior During Therapy: Flat affect Overall Cognitive Status: No family/caregiver present to determine baseline cognitive functioning                                 General Comments: able to state why he is in the hospital, follows commands appropriately     General Comments  SpO2 88% at lowest on 20L HFNC    Exercises     Shoulder Instructions      Home Living Family/patient expects to be discharged to:: Private residence Living Arrangements: Alone Available Help at Discharge: Family;Available PRN/intermittently Type of Home: Apartment Home Access: Stairs to enter Entrance Stairs-Number of Steps: 5 Entrance Stairs-Rails: Right;Left;Can reach both Home Layout: One level     Bathroom Shower/Tub: Chief Strategy Officer: Standard     Home Equipment: Grab bars - tub/shower          Prior Functioning/Environment Prior Level of Function : Independent/Modified Independent             Mobility Comments: denies using AD, states he doesn't work as he is on disability ADLs Comments: indep        OT Problem List: Decreased strength;Decreased range of  motion;Impaired balance (sitting and/or standing);Decreased activity tolerance;Decreased coordination;Decreased cognition      OT Treatment/Interventions: Self-care/ADL training;Therapeutic exercise;Energy conservation;DME and/or AE instruction;Therapeutic activities;Patient/family education;Balance training    OT Goals(Current goals can be found in the care plan section) Acute Rehab OT Goals Patient Stated Goal: none stated OT Goal Formulation: With patient Time For Goal Achievement: 05/18/23 Potential to Achieve Goals: Good ADL Goals Pt Will Perform Upper Body Dressing: with min guard assist;sitting Pt Will Perform Lower Body Dressing: sitting/lateral leans;sit to/from stand;with mod assist Pt Will Transfer to Toilet: with min assist;with +2 assist;squat pivot transfer;stand pivot transfer;bedside commode Additional ADL Goal #1: pt will perform bed mobility min guard A in prep for ADLs  OT Frequency: Min 1X/week    Co-evaluation PT/OT/SLP Co-Evaluation/Treatment: Yes Reason for Co-Treatment: For patient/therapist safety;To address functional/ADL transfers;Complexity of the patient's impairments (multi-system involvement) PT goals addressed during session: Mobility/safety with mobility OT goals addressed during session: ADL's and self-care      AM-PAC OT "6 Clicks" Daily Activity     Outcome Measure Help from another person eating meals?: A Little Help from another person taking care of personal grooming?: A Little Help from another person toileting, which includes using toliet, bedpan, or urinal?: A Lot Help from another person bathing (including washing, rinsing, drying)?: A Lot Help from another person to put on and taking off regular upper body clothing?: A Lot Help from another person to put on and taking off regular lower body clothing?: A Lot 6 Click Score: 14   End of Session Equipment Utilized During Treatment: Oxygen  Nurse Communication: Mobility status  Activity  Tolerance: Patient tolerated treatment well Patient left: in chair;with call bell/phone within reach;with family/visitor present  OT Visit Diagnosis: Unsteadiness on feet (R26.81);Other abnormalities of gait and mobility (R26.89);Muscle weakness (generalized) (M62.81)                Time: 1610-9604 OT Time Calculation (min): 20 min Charges:  OT General Charges $OT Visit: 1 Visit OT Evaluation $OT Eval Moderate Complexity: 1 Mod  Machai Desmith K, OTD, OTR/L SecureChat Preferred Acute Rehab [phone]    Carver Fila Koonce 05/04/2023, 12:36 PM

## 2023-05-04 NOTE — Progress Notes (Signed)
Physical Therapy Treatment Patient Details Name: Brent Rogers MRN: [id number] DOB: [birth year] Today's Date: 05/04/2023   History of Present Illness Brent Rogers is a 57 y.o. 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 PMH: hypertension, GERD, PUD, CAD, depression, anxiety, chronic pain, anemia, status post partial pancreatectomy, status post cholecystectomy, ETOH abuse    PT Comments  Pt continues to have anxiety re: difficulty breathing. Activity tolerance limited by DOE. Pt on 20LO2 via HFNC, drops to 88% when mobilizing. Pt with limited LE exercise tolerance due to breathing difficulties. Acute PT to cont to follow.    If plan is discharge home, recommend the following: A lot of help with walking and/or transfers;A lot of help with bathing/dressing/bathroom;Assist for transportation;Help with stairs or ramp for entrance   Can travel by private vehicle     No  Equipment Recommendations   (TBD at next venue, most likely will need a walker)    Recommendations for Other Services OT consult     Precautions / Restrictions Precautions Precautions: Fall Precaution Comments: edema t/o UEs and LEs Restrictions Weight Bearing Restrictions: No     Mobility  Bed Mobility Overal bed mobility: Needs Assistance Bed Mobility: Supine to Sit     Supine to sit: Mod assist, +2 for physical assistance     General bed mobility comments: assist for BLEs and trunk elevation, increased time, DOE SpO2 down to 88% on 20LO2 via HFNC    Transfers Overall transfer level: Needs assistance Equipment used: 2 person hand held assist Transfers: Sit to/from Stand, Bed to chair/wheelchair/BSC Sit to Stand: Max assist, +2 physical assistance   Step pivot transfers: Max assist, +2 physical assistance       General transfer comment: 2 small pivot transfers due to pt unable to come to full upright  position, DOE SpO2 at 88% on 20LO2 via [place]    Ambulation/Gait               General Gait Details: unable this date   Stairs             Wheelchair Mobility     Tilt Bed    Modified Rankin (Stroke Patients Only)       Balance Overall balance assessment: Needs assistance Sitting-balance support: Feet supported, Single extremity supported Sitting balance-Leahy Scale: Fair     Standing balance support: Bilateral upper extremity supported, During functional activity, Reliant on assistive device for balance Standing balance-Leahy Scale: Zero Standing balance comment: dependent on external support                            Cognition Arousal/Alertness: Awake/alert Behavior During Therapy: Flat affect, Anxious Overall Cognitive Status: No family/caregiver present to determine baseline cognitive functioning                                 General Comments: follow commands, easily irritated        Exercises General Exercises - Lower Extremity Ankle Circles/Pumps: AROM, Both, 5 reps, Seated Long Arc Quad: AROM, Both, 10 reps, Seated    General Comments General comments (skin integrity, edema, etc.): DOE with activity, SpO2 88% on 20L HFNC with activity, 92-93% at rest      Pertinent Vitals/Pain Pain Assessment Pain Assessment: Faces Faces Pain Scale: Hurts little more  Pain Location: bil feet Pain Descriptors / Indicators: Discomfort Pain Intervention(s): Limited activity within patient's tolerance    Home Living Family/patient expects to be discharged to:: Private residence Living Arrangements: Alone Available Help at Discharge: Family;Available PRN/intermittently Type of Home: Apartment Home Access: Stairs to enter Entrance Stairs-Rails: Right;Left;Can reach both Entrance Stairs-Number of Steps: 5   Home Layout: One level Home Equipment: Grab bars - tub/shower      Prior Function            PT Goals (current goals  can now be found in the care plan section) Acute Rehab PT Goals Patient Stated Goal: home PT Goal Formulation: With patient Time For Goal Achievement: 05/16/23 Potential to Achieve Goals: Good Progress towards PT goals: Progressing toward goals    Frequency    Min 1X/week      PT Plan Current plan remains appropriate    Co-evaluation PT/OT/SLP Co-Evaluation/Treatment: Yes Reason for Co-Treatment: For patient/therapist safety;To address functional/ADL transfers;Complexity of the patient's impairments (multi-system involvement) PT goals addressed during session: Mobility/safety with mobility OT goals addressed during session: ADL's and self-care      AM-PAC PT "6 Clicks" Mobility   Outcome Measure  Help needed turning from your back to your side while in a flat bed without using bedrails?: A Lot Help needed moving from lying on your back to sitting on the side of a flat bed without using bedrails?: A Lot Help needed moving to and from a bed to a chair (including a wheelchair)?: A Lot Help needed standing up from a chair using your arms (e.g., wheelchair or bedside chair)?: A Lot Help needed to walk in hospital room?: Total Help needed climbing 3-5 steps with a railing? : Total 6 Click Score: 10    End of Session Equipment Utilized During Treatment: Gait belt;Oxygen (20LO2 HFNC) Activity Tolerance: Patient limited by fatigue Patient left: with call bell/phone within reach;with chair alarm set;in chair;with family/visitor present Nurse Communication: Mobility status PT Visit Diagnosis: Unsteadiness on feet (R26.81);Muscle weakness (generalized) (M62.81);Difficulty in walking, not elsewhere classified (R26.2)     Time: 2440-1027 PT Time Calculation (min) (ACUTE ONLY): 16 min  Charges:    $Therapeutic Activity: 8-22 mins PT General Charges $$ ACUTE PT VISIT: 1 Visit                     Lewis Shock, PT, DPT Acute Rehabilitation Services Secure chat  preferred Office #: [phone]    Iona Hansen 05/04/2023, 11:28 AM

## 2023-05-04 NOTE — Plan of Care (Signed)
  Problem: Education: Goal: Knowledge of General Education information will improve Description: Including pain rating scale, medication(s)/side effects and non-pharmacologic comfort measures Outcome: Progressing   Problem: Health Behavior/Discharge Planning: Goal: Ability to manage health-related needs will improve Outcome: Progressing   Problem: Clinical Measurements: Goal: Ability to maintain clinical measurements within normal limits will improve Outcome: Not Progressing Note: has had increased O2 needs  Goal: Will remain free from infection Outcome: Progressing Goal: Diagnostic test results will improve Outcome: Progressing Goal: Respiratory complications will improve Outcome: Not Progressing Goal: Cardiovascular complication will be avoided Outcome: Progressing   Problem: Activity: Goal: Risk for activity intolerance will decrease Outcome: Progressing   Problem: Nutrition: Goal: Adequate nutrition will be maintained Outcome: Progressing   Problem: Coping: Goal: Level of anxiety will decrease Outcome: Progressing   Problem: Elimination: Goal: Will not experience complications related to bowel motility Outcome: Not Progressing Goal: Will not experience complications related to urinary retention Outcome: Progressing   Problem: Pain Managment: Goal: General experience of comfort will improve Outcome: Progressing   Problem: Safety: Goal: Ability to remain free from injury will improve Outcome: Progressing   Problem: Skin Integrity: Goal: Risk for impaired skin integrity will decrease Outcome: Progressing   Problem: Activity: Goal: Ability to tolerate increased activity will improve Outcome: Progressing   Problem: Respiratory: Goal: Ability to maintain a clear airway and adequate ventilation will improve Outcome: Progressing   Problem: Role Relationship: Goal: Method of communication will improve Outcome: Progressing

## 2023-05-04 NOTE — Progress Notes (Signed)
Nutrition Follow-up  DOCUMENTATION CODES:   Severe malnutrition in context of chronic illness, Underweight  INTERVENTION:   TPN to continue to meet 100% nutritional needs -Hypernatremia persists; pt remains on concentrated TPN at 65 ml/hr (1560 mL/24 hours), noted sodium has been removed from bag. Lasix held today. Pt may require free water administration via additional IVF or increased volume from TPN. Reached out to Pharmacy to discuss  CL diet ordered per Surgery, po diet is for pleasure only. Limited to 1200 mL fluid volume, encourage pt to sips slowly, Consider diluting higher sugar drinks, like juice, with water  Boost Breeze po TID ordered at request of Surgery, each supplement provides 250 kcal, 9 grams of protein, 34 grams of added sugars. As meeting nutritional needs via TPN, if pt with GI intolerance with oral supplement, recommend discontinuing   NUTRITION DIAGNOSIS:   Severe Malnutrition related to chronic illness as evidenced by severe fat depletion, severe muscle depletion.  GOAL:   Patient will meet greater than or equal to 90% of their needs  MONITOR:   Vent status, Labs, Weight trends, Skin, I & O's, Other (Comment) (TPN)  REASON FOR ASSESSMENT:   Consult New TPN/TNA  ASSESSMENT:   57 y.o. 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 Patient found to have  fistulous connections to GJ junction and small bowel  8/03 Extubated  Surgery date for fistula still pending  TPN continues at 65 ml/hr providing provides 123g of protein and 1930 kcals per day   Pt very thirsty, wanting to drink apple juice. +constipation, abd pain. Surgery cleared pt for small amounts of CL. Noted malabsorption and possible diarrhea expected  Noted persistent hypernatremia through the  weekend. Currently no sodium in TPN but remains on concentrated TPN. Recommend considering increasing volume of TPN. Lasix held today per RN, BUN rising, Creatinine wdl. +free water deficit  Weight down with diuresis; current wt 61.7 kg (lowest wt since admission). Still with some anasarca, 3rd spacing.   Labs: sodium 152 (H), potassium 2.7 (L), BUN 30 (H), Creatinine 0.83 Meds: lasix, reglan prn, zofran prn   Diet Order:   Diet Order             Diet clear liquid Room service appropriate? Yes; Fluid consistency: Thin; Fluid restriction: 1200 mL Fluid  Diet effective now                   EDUCATION NEEDS:   Not appropriate for education at this time  Skin:  Skin Assessment: Reviewed RN Assessment  Last BM:  04/24/23 small type 6  Height:   Ht Readings from Last 1 Encounters:  04/18/23 6\' 3"  (1.905 m)    Weight:   Wt Readings from Last 1 Encounters:  05/04/23 61.7 kg     BMI:  Body mass index is 17 kg/m.  Estimated Nutritional Needs:   Kcal:  1900-2000 kcals via IC  Protein:  110-125 g  Fluid:  >/= 2L   [name] MS, RDN, LDN, CNSC Registered Dietitian 3 Clinical Nutrition RD Pager and On-Call Pager Number Located in [place]

## 2023-05-04 NOTE — Progress Notes (Signed)
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  Complains of 8 out of 10 pain.  eICU Interventions  Patient's chart reviewed.  Video assessment done.  Hydromorphone x 1 dose ordered.  Bedside nurse present for video assessment.     Intervention Category Minor Interventions: Other:  Carilyn Goodpasture 05/04/2023, 5:01 AM

## 2023-05-04 NOTE — Progress Notes (Signed)
Pharmacy Electrolyte Replacement (per TPN protocol)  Recent Labs:  Recent Labs    05/04/23 0651 05/04/23 1740  K 2.7* 3.2*  MG 2.2  --   PHOS 2.9  --   CREATININE 0.83 0.66    Low Critical Values (K </= 2.5, Phos </= 1, Mg </= 1) Present: None  Also of note that lasix was held this a.m. with low K but MD okayed giving tonight's dose.  Plan: KCl IV x 6  Christoper Fabian, PharmD, BCPS Please see amion for complete clinical pharmacist phone list 05/04/2023 6:58 PM

## 2023-05-04 NOTE — Progress Notes (Signed)
PHARMACY - TOTAL PARENTERAL NUTRITION CONSULT NOTE  Indication: Gastrocolic fistula   Patient Measurements: Height: 6\' 3"  (190.5 cm) Weight: 61.7 kg (136 lb 0.4 oz) IBW/kg (Calculated) : 84.5 TPN AdjBW (KG): 53.2 Body mass index is 17 kg/m. 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  Patient presented on 7/19 with N/V x 4 days along with LLQ pain.  CT showed thickening of the distal transverse colon with complex fistulous connection between the transverse colon to the gastrojejunal anastomosis and adjacent small bowel loops.  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  Pharmacy consulted to manage TPN.  Patient was started on CLD on 7/22 and reported fullness/abdominal pain after eating.  He reports not eating for a week PTA.  He does not weigh himself, but he knows he has lost a lot of weight; his pants size reduced from a 36 to a 28 over a 6 month time frame.  He typically eats 2 meals a day (lunch and dinner) and he eats protein, vegetables and carb; he rarely eats fruits. Pt emergently intubated 7/27 and made NPO status. Pt self-extubated on 7/30 AM, but required emergent reintubation on 7/31 around midnight.  RD performed indirect calorimetry on 8/1 and determined patients caloric needs to be ~1900 kcal/day.  Plan for GJ-colonic fistula repair when nutrition status improved.    Glucose / Insulin: no hx DM. BG <180 on D10 @50ml /hr. Stopped SSI 8/4.  7/31 - significant hypoglycemia on cyclic TPN   8/1 - insulin removed from TPN and BG stablized Electrolytes: Na up 152 (none in TPN), K down 2.7 (s/p Lasix 20 8/4, now on 40 iV BID; received 60 mEq IV KCl), Cl up 116, CoCa 9.6, Phos stable 2.9, Mag up 2.2. all others WNL Renal: SCr 0.83, BUN 30 Hepatic:  AST down 47, alk phos/ALT/Tbili wnl, TG 149, albumin 1.8, pre-albumin <5 Intake /  Output; MIVF: UOP 2.9 ml/kg/hr on furosemide IV 40mg  Q12hr. Net +2L  GI Imaging: LBM 7/26 - constipated.  7/25 CT Abd: severe fatty liver, thickened appearance of colon GI Surgeries / Procedures: none since TPN initiation  Central access: PICC placed 04/21/23 TPN start date: 04/22/23  Nutritional Goals: Goal concentrated TPN rate is 65 mL/hr (provides 123g of protein and 1930 kcals per day)    RD Estimated Needs Total Energy Estimated Needs: 1900-2000 kcals via IC Total Protein Estimated Needs: 110-125 g Total Fluid Estimated Needs: 1.8 L  Current Nutrition:  NPO + TPN 8/2 @1300 - propofol off   Plan:  Continue concentrated TPN at goal rate 65 ml/hr, meeting 100% of estimated needs Electrolytes in TPN: Na 0 mEq/L, K 80 mEq/L (125 mEq in bag), Ca 4 mEq/L, Mag 15 mEq/L (Max), Phos 25 mmol/L, Cl:Ac to max acetate for now Add daily multivitamin and trace elements in TPN Continue CBG checks to q12hr  Monitor diuresis plan and adjust K as needed Standard TPN labs every Mon/Thurs and as needed   Thank you for allowing pharmacy to be a part of this patient's care.  Link Snuffer, PharmD, BCPS, BCCCP Clinical Pharmacist Please refer to [facility] for [facility] Pharmacy numbers 05/04/2023, 9:32 AM

## 2023-05-04 NOTE — Progress Notes (Signed)
CSW following to speak with patient regarding PT recommendations closer to patient being medically ready for dc.

## 2023-05-05 DIAGNOSIS — E871 Hypo-osmolality and hyponatremia: Secondary | ICD-10-CM | POA: Diagnosis not present

## 2023-05-05 DIAGNOSIS — J9601 Acute respiratory failure with hypoxia: Secondary | ICD-10-CM | POA: Diagnosis not present

## 2023-05-05 LAB — BASIC METABOLIC PANEL
Anion gap: 10 (ref 5–15)
BUN: 28 mg/dL — ABNORMAL HIGH (ref 6–20)
CO2: 25 mmol/L (ref 22–32)
Calcium: 8.3 mg/dL — ABNORMAL LOW (ref 8.9–10.3)
Chloride: 113 mmol/L — ABNORMAL HIGH (ref 98–111)
Creatinine, Ser: 0.6 mg/dL — ABNORMAL LOW (ref 0.61–1.24)
GFR, Estimated: >60 mL/min (ref >60)
Glucose, Bld: 165 mg/dL — ABNORMAL HIGH (ref 70–99)
Potassium: 3.4 mmol/L — ABNORMAL LOW (ref 3.5–5.1)
Sodium: 148 mmol/L — ABNORMAL HIGH (ref 135–145)

## 2023-05-05 LAB — GLUCOSE, CAPILLARY
Glucose-Capillary: 124 mg/dL — ABNORMAL HIGH (ref 70–99)
Glucose-Capillary: 139 mg/dL — ABNORMAL HIGH (ref 70–99)
Glucose-Capillary: 139 mg/dL — ABNORMAL HIGH (ref 70–99)
Glucose-Capillary: 172 mg/dL — ABNORMAL HIGH (ref 70–99)
Glucose-Capillary: 198 mg/dL — ABNORMAL HIGH (ref 70–99)

## 2023-05-05 MED ORDER — TRACE MINERALS CU-MN-SE-ZN 300-55-60-3000 MCG/ML IV SOLN
INTRAVENOUS | Status: AC
  Filled 2023-05-05: qty 821.6

## 2023-05-05 MED ORDER — VANCOMYCIN HCL 1250 MG/250ML IV SOLN
1250.0000 mg | INTRAVENOUS | Status: DC
  Administered 2023-05-05 – 2023-05-10 (×6): 1250 mg via INTRAVENOUS
  Filled 2023-05-05 (×6): qty 250

## 2023-05-05 MED ORDER — POTASSIUM CHLORIDE 10 MEQ/50ML IV SOLN
10.0000 meq | INTRAVENOUS | Status: AC
  Administered 2023-05-05 (×2): 10 meq via INTRAVENOUS
  Filled 2023-05-05 (×2): qty 50

## 2023-05-05 MED ORDER — POTASSIUM CHLORIDE 10 MEQ/50ML IV SOLN: 10.0000 meq | INTRAVENOUS | Status: DC

## 2023-05-05 MED ORDER — POTASSIUM CHLORIDE 10 MEQ/50ML IV SOLN
10.0000 meq | INTRAVENOUS | Status: AC
  Administered 2023-05-05 (×5): 10 meq via INTRAVENOUS
  Filled 2023-05-05 (×5): qty 50

## 2023-05-05 MED ORDER — POTASSIUM CHLORIDE 10 MEQ/50ML IV SOLN
10.0000 meq | INTRAVENOUS | Status: AC
  Administered 2023-05-05 (×4): 10 meq via INTRAVENOUS
  Filled 2023-05-05 (×4): qty 50

## 2023-05-05 MED ORDER — TRAZODONE HCL 50 MG PO TABS
25.0000 mg | ORAL_TABLET | Freq: Every day | ORAL | Status: DC
  Administered 2023-05-05 – 2023-05-06 (×2): 25 mg via ORAL
  Filled 2023-05-05 (×2): qty 1

## 2023-05-05 NOTE — Progress Notes (Addendum)
Pharmacy Electrolyte Replacement (per TPN protocol)  Recent Labs:  Recent Labs    05/04/23 0651 05/04/23 1740 05/05/23 0138 05/05/23 1611  K 2.7*   < > 3.3* 3.4*  MG 2.2  --  2.1  --   PHOS 2.9  --   --   --   CREATININE 0.83   < > 0.76 0.60*   < > = values in this interval not displayed.    Low Critical Values (K </= 2.5, Phos </= 1, Mg </= 1) Present: None  Pt also on lasix 40mg  IV BID.   Plan: KCl IV x 5  Christoper Fabian, PharmD, BCPS Please see amion for complete clinical pharmacist phone list 05/05/2023 5:12 PM

## 2023-05-05 NOTE — Progress Notes (Signed)
RT assessed pt on HHFNC, pt tolerating current settings at this time with no increased work of breathing. RT discussed with pt use of BIPAP at night, pt explained the anxiety that came with the BIPAP trial earlier on day shift. RT explained if his work of breathing increases we will have to try the BIPAP again, and we will address his anxiety if he has it again.

## 2023-05-05 NOTE — Progress Notes (Signed)
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  Attempting conservative management and TPN to try to help nutritional status.  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  Denies any frank aspiration, breathing worsened after a BM.  CXR with worsening bilateral infiltrates. After discussion with rapid response and crosscover TRH, PCCM consulted to assist with management.  Patient states breathing is stable from this am, has not been able to sleep due to anxiety over SOB.  2022 echo normal  Pertinent  Medical History  Chronic pancreatitis Prior Whipple Severe protein calorie malnutrition Question of alcohol abuse but multiple previous notes state his use is modest at best.  I do see a level of 134 back in 2021. HTN  Significant Hospital Events: Including procedures, antibiotic start and stop dates in addition to other pertinent events   7/19 admit 7/26 thoracentesis with 800 cc of fluid obtained 7/29 tolerating SBT 7/30 self extubated during SBT. 7/31 reintubated for increased respiratory distress. 8/1 failed SBT, significant agitation  8/2 Failed SBT again this am due to agitation, remains on fentanyl and propofol   Interim History / Subjective:  Continues to complain of shortness of breath and cough with thick sputum Stated abdominal pain is better, denies nausea and vomiting Oxygen requirement is going up, currently on 50 L and 90%, will switch him to BiPAP  Objective   Blood pressure (!) 122/90, pulse (!) 102, temperature 98.8 F (37.1 C), temperature source Oral, resp. rate (!) 30, height 6\' 3"  (1.905 m),  weight 60.1 kg, SpO2 91%. CVP:  [5 mmHg-12 mmHg] 5 mmHg  FiO2 (%):  [55 %-73 %] 73 %   Intake/Output Summary (Last 24 hours) at 05/05/2023 0738 Last data filed at 05/05/2023 0700 Gross per 24 hour  Intake 3124.06 ml  Output 2470 ml  Net 654.06 ml   Filed Weights   05/03/23 0346 05/04/23 0300 05/05/23 0417  Weight: 65.5 kg 61.7 kg 60.1 kg    Examination: General: Acute on chronically ill-appearing male, lying on the bed, on heated high flow nasal cannula oxygen HEENT: [place]/AT, eyes anicteric.  moist mucus membranes Neuro: Alert, awake following commands, generalized weak Chest: Bilateral crackles left more than right, no wheezes or rhonchi Heart: Regular rate and rhythm, no murmurs or gallops Abdomen: Soft, nontender, nondistended, bowel sounds present Skin: No rash  Labs and images were reviewed  Resolved problems:  Septic shock  Severe leukemoid reaction  Hyponatremia  Hypophosphatemia Hypomagnesemia ARDS Acute urinary tract infection with Klebsiella Right pleural effusion s/p thoracentesis  Assessment & Plan:  Acute hypoxic respiratory failure, worsening Recurrent aspiration pneumonia Received Ceftriaxone 7/19-7/25, Flagyl 7/19-7/25, Zosyn 7/25-7/30, Vanc 7/26-7/28, and Micafungin 7/26-7/28 Patient was extubated on 8/3 Patient remained on high flow nasal cannula oxygen, with increasing oxygen requirement Will switch him to BiPAP Continue to have thick sputum, sputum culture is pending X-ray chest suggestive of bilateral infiltrate, left more than right Continue meropenem Add vancomycin Adjust antibiotic after sensitivity data White count is up He is afebrile, trend ABGs  Gastrocolic fistula, being manage conservatively for now Will likely need surgical repair once medically optimized  Acute colitis Pancreatic mass status post Whipple's 2015  Severe protein calorie malnutrition  Primary management per Surgery  Recommend starting clear liquid diet Continue TPN Per  surgery  Supportive care  Optimize electrolytes   Obstipation Patient last bowel movement was on 7/26 No bowel movement yet Continue aggressive bowel regimen  Hypernatremia, likely due to aggressive diuresis with albumin Hypokalemia TPN being adjusted Serum sodium is improving, currently at 148 Continue aggressive electrolyte replacement  Anemia of critical illness Thrombocytopenia in the setting of sepsis  Monitor H&H and platelet count Platelet counts are low but is stable  DVT of unknown duration Continue therapeutic Lovenox  Best Practice (right click and "Reselect all SmartList Selections" daily)   Diet/type: TPN DVT prophylaxis: Therapeutic Lovenox GI prophylaxis: PPI Lines: Central line for TPN Foley:  N/A Code Status:  full code Last date of multidisciplinary goals of care discussion: 8/4: Patient was updated at bedside   The patient is critically ill due to acute respiratory failure with hypoxia.  Critical care was necessary to treat or prevent imminent or life-threatening deterioration.  Critical care was time spent personally by me on the following activities: development of treatment plan with patient and/or surrogate as well as nursing, discussions with consultants, evaluation of patient's response to treatment, examination of patient, obtaining history from patient or surrogate, ordering and performing treatments and interventions, ordering and review of laboratory studies, ordering and review of radiographic studies, pulse oximetry, re-evaluation of patient's condition and participation in multidisciplinary rounds.   During this encounter critical care time was devoted to patient care services described in this note for 34 minutes.     Cheri Fowler, MD [facility] See Amion for pager If no response to pager, please call [phone] until 7pm After 7pm, Please call E-link [phone]

## 2023-05-05 NOTE — Progress Notes (Signed)
Pharmacy Antibiotic Note  Brent Rogers is a 57 y.o. male admitted on 04/17/2023 GI fistula now with purulent sputum possible  pneumonia.  Pharmacy has been consulted for meropenem dosing. Will add vancomycin with increased WBC 20 and ongoing O2 requirements Cr stable 0.7 crcl 50ml/min  Plan: Continue meropenem 1000 mg every 8 hours Add vancomycin 1250mg  IV q24h est AUC 488 Monitor fever, WBC, O2 requirements, and sputum characteristics Monitor renal function for appropriate dosing.  Height: 6\' 3"  (190.5 cm) Weight: 60.1 kg (132 lb 7.9 oz) IBW/kg (Calculated) : 84.5  Temp (24hrs), Avg:99.1 F (37.3 C), Min:98.5 F (36.9 C), Max:99.9 F (37.7 C)  Recent Labs  Lab 05/02/23 0806 05/02/23 1601 05/03/23 0531 05/03/23 0651 05/03/23 1726 05/04/23 0651 05/04/23 1740 05/05/23 0138  WBC  --  13.4*  --  15.9* 16.1* 17.9*  --  20.2*  CREATININE 0.59*  --  0.78  --   --  0.83 0.66 0.76    Estimated Creatinine Clearance: 86.6 mL/min (by C-G formula based on SCr of 0.76 mg/dL).    No Known Allergies  Antimicrobials this admission: Meropenem 8/4 >>  Zosyn 7/25 >> 7/30 Vancomycin 7/26 >> 7/27, 8/6> Micafungin 7/26 >> 7/27 Ceftriaxone 7/19 >> 7/25 Metronidazole 7/19 >> 7/25  Dose adjustments this admission: None, will continue to monitor renal function  Microbiology results: 7/20 Ucx >100k kleb pneumo (S to CTX) 7/25 BCx - NGTD 7/27 TA - no growth    Leota Sauers Pharm.D. CPP, BCPS Clinical Pharmacist [phone] 05/05/2023 10:07 AM

## 2023-05-05 NOTE — Progress Notes (Addendum)
PHARMACY - TOTAL PARENTERAL NUTRITION CONSULT NOTE  Indication: Gastrocolic fistula   Patient Measurements: Height: 6\' 3"  (190.5 cm) Weight: 60.1 kg (132 lb 7.9 oz) IBW/kg (Calculated) : 84.5 TPN AdjBW (KG): 53.2 Body mass index is 16.56 kg/m. 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  Patient presented on 7/19 with N/V x 4 days along with LLQ pain.  CT showed thickening of the distal transverse colon with complex fistulous connection between the transverse colon to the gastrojejunal anastomosis and adjacent small bowel loops.  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  Pharmacy consulted to manage TPN.  Patient was started on CLD on 7/22 and reported fullness/abdominal pain after eating.  He reports not eating for a week PTA.  He does not weigh himself, but he knows he has lost a lot of weight; his pants size reduced from a 36 to a 28 over a 6 month time frame.  He typically eats 2 meals a day (lunch and dinner) and he eats protein, vegetables and carb; he rarely eats fruits. Pt emergently intubated 7/27 and made NPO status. Pt self-extubated on 7/30 AM, but required emergent reintubation on 7/31 around midnight.  RD performed indirect calorimetry on 8/1 and determined patients caloric needs to be ~1900 kcal/day.  Plan for GJ-colonic fistula repair when nutrition status improved.    Glucose / Insulin: no hx DM. BG <180. Stopped SSI 8/4.  7/31 - significant hypoglycemia on cyclic TPN   8/1 - insulin removed from TPN and BG stablized Electrolytes: Na down 148 (after holding one dose Lasix 8/5; none in TPN, taking in oral water), K 3.3 (on Lasix 40 IV BID but held dose 8/5 AM; received total of outside TPN yesterday; 40 mEq given this AM), Cl 116>>112 (after changing to max acetate), CoCa 9.5, Phos stable 2.9, Mag 2.1. all others  WNL Renal: SCr 0.76, BUN 27 Hepatic:  AST down 47, alk phos/ALT/Tbili wnl, TG 149, albumin 2.6, pre-albumin <5 Intake / Output; MIVF: UOP 1.7 ml/kg/hr on furosemide IV 40mg  every 12 hours (tea colored urine). Net -2.5L. Still with anasarca. GI Imaging: LBM 8/5 (type 6).    7/25 CT Abd: severe fatty liver, thickened appearance of colon GI Surgeries / Procedures: none since TPN initiation  Central access: PICC placed 04/21/23 TPN start date: 04/22/23  Nutritional Goals: Goal concentrated TPN rate is 65 mL/hr (provides 123g of protein and 1930 kcals per day)    RD Estimated Needs Total Energy Estimated Needs: 1900-2000 kcals via IC Total Protein Estimated Needs: 110-125 g Total Fluid Estimated Needs: >/= 2L  Current Nutrition:  Clear liquids and Oral supplements (for pleasure, poor absorption expected) + TPN 8/2 @1300 - propofol off   Plan:  Continue concentrated TPN at goal rate 65 ml/hr, meeting 100% of estimated needs Electrolytes in TPN: continue Na 0 mEq/L, K 80 mEq/L (125 mEq in bag), Ca 4 mEq/L, Mag 15 mEq/L (Max), Phos 25 mmol/L, Cl:Ac to max acetate for now Add daily multivitamin and trace elements in TPN Continue CBG checks to q12hr  Monitor diuresis plan and adjust K as needed - replacing outside TPN for now with changing diuretic plan Standard TPN labs every Mon/Thurs and as needed   Thank you for allowing pharmacy to be a part of this patient's care.  Link Snuffer, PharmD, BCPS, BCCCP Clinical Pharmacist Please refer to [facility] for [facility] numbers  05/05/2023, 7:17 AM

## 2023-05-05 NOTE — Progress Notes (Signed)
eLink Physician-Brief Progress Note Patient Name: Brent Rogers DOB: 1965/12/02 MRN: [id number]   Date of Service  05/05/2023  HPI/Events of Note  K+ 3.3   on TPN    has PICC Cr normal Mag 2.1  eICU Interventions  IV potassium chloride protocol ordering     Intervention Category Minor Interventions: Electrolytes abnormality - evaluation and management  Ranee Gosselin 05/05/2023, 3:45 AM

## 2023-05-05 NOTE — Progress Notes (Signed)
Patient desat, increased WOB. RT attempted to place patient on Bipap. Patient did not tolerate due to anxiety but would like to try to wear it tonight. RT adjusted HHFNC on patient and sats recovered to 93%. CCM and RN notified.

## 2023-05-05 NOTE — Progress Notes (Deleted)
Patient awakened from sleep in a coughing fit, endorsing the feeling of drowning. CVP is currently 10. There are fine crackles in bilateral lung bases. Patient was sat up, placed on 2L [place] with reports of relief. PA [facility] paged, awaiting page back and further orders

## 2023-05-05 NOTE — Plan of Care (Signed)
  Problem: Health Behavior/Discharge Planning: Goal: Ability to manage health-related needs will improve Outcome: Progressing   Problem: Clinical Measurements: Goal: Ability to maintain clinical measurements within normal limits will improve Outcome: Progressing Goal: Will remain free from infection Outcome: Not Progressing Goal: Diagnostic test results will improve Outcome: Not Progressing Goal: Respiratory complications will improve Outcome: Not Progressing Goal: Cardiovascular complication will be avoided Outcome: Progressing   Problem: Activity: Goal: Risk for activity intolerance will decrease Outcome: Progressing   Problem: Nutrition: Goal: Adequate nutrition will be maintained Outcome: Progressing   Problem: Coping: Goal: Level of anxiety will decrease Outcome: Progressing   Problem: Elimination: Goal: Will not experience complications related to bowel motility Outcome: Progressing Goal: Will not experience complications related to urinary retention Outcome: Progressing   Problem: Pain Managment: Goal: General experience of comfort will improve Outcome: Progressing   Problem: Safety: Goal: Ability to remain free from injury will improve Outcome: Progressing   Problem: Skin Integrity: Goal: Risk for impaired skin integrity will decrease Outcome: Progressing   Problem: Activity: Goal: Ability to tolerate increased activity will improve Outcome: Progressing   Problem: Respiratory: Goal: Ability to maintain a clear airway and adequate ventilation will improve Outcome: Progressing   Problem: Role Relationship: Goal: Method of communication will improve Outcome: Progressing

## 2023-05-06 DIAGNOSIS — E871 Hypo-osmolality and hyponatremia: Secondary | ICD-10-CM | POA: Diagnosis not present

## 2023-05-06 DIAGNOSIS — J9601 Acute respiratory failure with hypoxia: Secondary | ICD-10-CM | POA: Diagnosis not present

## 2023-05-06 DIAGNOSIS — K529 Noninfective gastroenteritis and colitis, unspecified: Secondary | ICD-10-CM | POA: Diagnosis not present

## 2023-05-06 LAB — BASIC METABOLIC PANEL
Anion gap: 10 (ref 5–15)
BUN: 26 mg/dL — ABNORMAL HIGH (ref 6–20)
CO2: 24 mmol/L (ref 22–32)
Calcium: 8 mg/dL — ABNORMAL LOW (ref 8.9–10.3)
Chloride: 108 mmol/L (ref 98–111)
Creatinine, Ser: 0.64 mg/dL (ref 0.61–1.24)
GFR, Estimated: >60 mL/min (ref >60)
Glucose, Bld: 158 mg/dL — ABNORMAL HIGH (ref 70–99)
Potassium: 3.5 mmol/L (ref 3.5–5.1)
Sodium: 142 mmol/L (ref 135–145)

## 2023-05-06 LAB — GLUCOSE, CAPILLARY
Glucose-Capillary: 138 mg/dL — ABNORMAL HIGH (ref 70–99)
Glucose-Capillary: 141 mg/dL — ABNORMAL HIGH (ref 70–99)
Glucose-Capillary: 151 mg/dL — ABNORMAL HIGH (ref 70–99)
Glucose-Capillary: 153 mg/dL — ABNORMAL HIGH (ref 70–99)
Glucose-Capillary: 168 mg/dL — ABNORMAL HIGH (ref 70–99)

## 2023-05-06 MED ORDER — TRACE MINERALS CU-MN-SE-ZN 300-55-60-3000 MCG/ML IV SOLN
INTRAVENOUS | Status: AC
  Filled 2023-05-06: qty 821.6

## 2023-05-06 MED ORDER — POTASSIUM CHLORIDE 10 MEQ/50ML IV SOLN
10.0000 meq | INTRAVENOUS | Status: AC
  Administered 2023-05-06 – 2023-05-07 (×4): 10 meq via INTRAVENOUS
  Filled 2023-05-06 (×4): qty 50

## 2023-05-06 MED ORDER — POTASSIUM CHLORIDE 10 MEQ/50ML IV SOLN
10.0000 meq | INTRAVENOUS | Status: AC
  Administered 2023-05-06 (×6): 10 meq via INTRAVENOUS
  Filled 2023-05-06 (×6): qty 50

## 2023-05-06 MED ORDER — POTASSIUM CHLORIDE 10 MEQ/50ML IV SOLN: 10.0000 meq | INTRAVENOUS | Status: DC

## 2023-05-06 NOTE — Progress Notes (Signed)
eLink Physician-Brief Progress Note Patient Name: Brent Rogers DOB: 07-Aug-1966 MRN: [id number]   Date of Service  05/06/2023  HPI/Events of Note  K of 3.5 On TPN via PICC.  eICU Interventions  KCl ordered.     Intervention Category Minor Interventions: Electrolytes abnormality - evaluation and management  [name] 05/06/2023, 5:32 AM

## 2023-05-06 NOTE — Progress Notes (Signed)
PHARMACY - TOTAL PARENTERAL NUTRITION CONSULT NOTE  Indication: Gastrocolic fistula   Patient Measurements: Height: 6\' 3"  (190.5 cm) Weight: 58.5 kg (128 lb 15.5 oz) IBW/kg (Calculated) : 84.5 TPN AdjBW (KG): 53.2 Body mass index is 16.12 kg/m. 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  Patient presented on 7/19 with N/V x 4 days along with LLQ pain.  CT showed thickening of the distal transverse colon with complex fistulous connection between the transverse colon to the gastrojejunal anastomosis and adjacent small bowel loops.  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  Pharmacy consulted to manage TPN.  Patient was started on CLD on 7/22 and reported fullness/abdominal pain after eating.  He reports not eating for a week PTA.  He does not weigh himself, but he knows he has lost a lot of weight; his pants size reduced from a 36 to a 28 over a 6 month time frame.  He typically eats 2 meals a day (lunch and dinner) and he eats protein, vegetables and carb; he rarely eats fruits.   Glucose / Insulin: no hx DM - CBG <180. Stopped SSI 8/4.  7/31 - significant hypoglycemia on cyclic TPN   8/1 - insulin removed from TPN and BG stablized Electrolytes: Na down 147 (none in TPN, taking in oral water), CL stable at 113, K 3.5 (Lasix 40 IV BID, received total of outside TPN yesterday), others WNL Renal: SCr < 1, BUN up to 29 Hepatic: AST down 47, alk phos/ALT/Tbili wnl, TG 149, albumin 2.6, pre-albumin <5 Intake / Output; MIVF: UOP 2.8 ml/kg/hr with Lasix, Net -2.5L. Still with anasarca, LBM 8/5 GI Imaging:  7/25 CT: severe fatty liver, thickened appearance of colon GI Surgeries / Procedures: none since TPN initiation  Central access: PICC placed 04/21/23 TPN start date: 04/22/23  Nutritional Goals: Goal concentrated TPN rate is 65 mL/hr  (provides 123g AA and 1930 kCal per day)    8/1 RD Estimated Needs Total Energy Estimated Needs: 1900-2000 kcals via IC Total Protein Estimated Needs: 110-125 g Total Fluid Estimated Needs: >/= 2L  Current Nutrition:  Clear liquids (for pleasure, poor absorption expected)  Resource Breeze TID - 2 charted given yesterday TPN  Plan:  Continue concentrated TPN at goal rate 65 ml/hr, meeting 100% of estimated needs Electrolytes in TPN: Na 0 mEq/L, K 80 mEq/L (= 125 mEq/day), Ca 4 mEq/L, Mag 15 mEq/L, Phos 25 mmol/L, max acetate  Add daily multivitamin and trace elements in TPN D/C SSI.  Continue CBG checks BID. Monitor diuresis plan - replace KCL outside of TPN >> KCL x 6 runs per MD. Standard TPN labs every Mon/Thurs and PRN  GJ-colonic fistula repair when nutrition status improved per Surgery  Cedra Villalon D. Laney Potash, PharmD, BCPS, BCCCP 05/06/2023, 8:57 AM

## 2023-05-06 NOTE — Plan of Care (Signed)
Patient progressing 

## 2023-05-06 NOTE — Progress Notes (Signed)
CSW following to speak with patient regarding PT recs. Closer to patient being medically ready for dc.

## 2023-05-06 NOTE — Progress Notes (Signed)
NAME:  Brent Rogers, MRN:  [id number], DOB:  13-Sep-1966, LOS: 18 ADMISSION DATE:  04/17/2023, CONSULTATION DATE:  04/24/23 REFERRING MD:  Rapid and TRH crosscover, CHIEF COMPLAINT:  SOB   History of Present Illness:  57 year old man w/ hx of alcohol use, PUD, chronic pancreatitis, prior pancreatectomy and cholecystectomy p/w abd pain found to have gastrocolonic fistula.  Attempting conservative management and TPN to try to help nutritional status.  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  Denies any frank aspiration, breathing worsened after a BM.  CXR with worsening bilateral infiltrates. After discussion with rapid response and crosscover TRH, PCCM consulted to assist with management.  Patient states breathing is stable from this am, has not been able to sleep due to anxiety over SOB.  2022 echo normal  Pertinent  Medical History  Chronic pancreatitis Prior Whipple Severe protein calorie malnutrition Question of alcohol abuse but multiple previous notes state his use is modest at best.  I do see a level of 134 back in 2021. HTN  Significant Hospital Events: Including procedures, antibiotic start and stop dates in addition to other pertinent events   7/19 admit 7/26 thoracentesis with 800 cc of fluid obtained 7/29 tolerating SBT 7/30 self extubated during SBT. 7/31 reintubated for increased respiratory distress. 8/1 failed SBT, significant agitation  8/2 Failed SBT again this am due to agitation, remains on fentanyl and propofol   Interim History / Subjective:  Patient stated breathing is much better, coughing less Continue to require high flow nasal cannula oxygen, on 40 L and 80% FiO2 Did not require BiPAP overnight   Objective   Blood pressure (!) 131/96, pulse (!) 111, temperature 98.2 F (36.8 C), temperature source Oral, resp. rate (!) 28, height 6\' 3"  (1.905 m), weight 58.5 kg, SpO2 95%.    FiO2 (%):  [80 %] 80 %    Intake/Output Summary (Last 24 hours) at 05/06/2023 0807 Last data filed at 05/06/2023 0700 Gross per 24 hour  Intake 3650.58 ml  Output 3865 ml  Net -214.42 ml   Filed Weights   05/04/23 0300 05/05/23 0417 05/06/23 0500  Weight: 61.7 kg 60.1 kg 58.5 kg    Examination: General: Middle-age male, lying on the bed HEENT: [place]/AT, eyes anicteric.  moist mucus membranes.  On high flow nasal cannula oxygen Neuro: Alert, awake following commands Chest: Tachypneic, coarse breath sounds, no wheezes or rhonchi Heart: Regular rate and rhythm, no murmurs or gallops Abdomen: Soft, nontender, nondistended, bowel sounds present Skin: No rash   Labs and images were reviewed  Resolved problems:  Septic shock  Severe leukemoid reaction  Hyponatremia  Hypophosphatemia Hypomagnesemia ARDS Acute urinary tract infection with Klebsiella Right pleural effusion s/p thoracentesis  Assessment & Plan:  Acute hypoxic respiratory failure Recurrent aspiration pneumonia Received Ceftriaxone 7/19-7/25, Flagyl 7/19-7/25, Zosyn 7/25-7/30, Vanc 7/26-7/28, and Micafungin 7/26-7/28 Patient was extubated on 8/3 Continue to require high flow nasal cannula oxygen, currently on 40 L and 80% FiO2 Titrate with O2 sat goal 92% Did not require BiPAP overnight Stated cough and shortness of breath is getting better Follow-up respiratory culture Continue meropenem and vancomycin Remain afebrile White count continues to rise, currently at 22  Gastrocolic fistula, being manage conservatively for now Will likely need surgical repair once medically optimized  Acute colitis Pancreatic mass status post Whipple's 2015 Severe protein calorie malnutrition  General Surgery has signed off for now Continue clear liquid diet Continue TPN Per surgery  Supportive care  Optimize electrolytes   Obstipation Continue with bowel regimen as he does not have bowel movement in last 10 days  Hypernatremia, likely due to  aggressive diuresis with albumin Hypokalemia Serum sodium is improving, currently at 14 Continue free water Continue aggressive electrolyte replacement  Anemia of critical illness Thrombocytopenia in the setting of sepsis  Monitor H&H and platelet count Platelet counts are low but is stable  DVT of unknown duration Continue therapeutic Lovenox  Best Practice (right click and "Reselect all SmartList Selections" daily)   Diet/type: TPN DVT prophylaxis: Therapeutic Lovenox GI prophylaxis: PPI Lines: Central line for TPN Foley:  N/A Code Status:  full code Last date of multidisciplinary goals of care discussion: 8/4: Patient was updated at bedside   The patient is critically ill due to acute respiratory failure with hypoxia.  Critical care was necessary to treat or prevent imminent or life-threatening deterioration.  Critical care was time spent personally by me on the following activities: development of treatment plan with patient and/or surrogate as well as nursing, discussions with consultants, evaluation of patient's response to treatment, examination of patient, obtaining history from patient or surrogate, ordering and performing treatments and interventions, ordering and review of laboratory studies, ordering and review of radiographic studies, pulse oximetry, re-evaluation of patient's condition and participation in multidisciplinary rounds.   During this encounter critical care time was devoted to patient care services described in this note for 32 minutes.     Cheri Fowler, MD [facility] See Amion for pager If no response to pager, please call [phone] until 7pm After 7pm, Please call E-link [phone]

## 2023-05-06 NOTE — Progress Notes (Signed)
Physical Therapy Treatment Patient Details Name: Brent Rogers MRN: [id number] DOB: [birth year] Today's Date: 05/06/2023   History of Present Illness Pt is a 57 y.o. 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 Conservative management with TPN with potential for eventual surgery. Rapid response called, on BiPAP and s/p thoracentesis 7/26. Intubated 7/27 and self-extubated 7/31, reintubated 7/31-8/3. PMH includes HTN, CAD, GERD, PUD, s/p cholecystectomy, partial pancreatectomy, anxiety, depression, chronic pain, scoliosis, ETOH abuse.   PT Comments  Pt progressing with mobility. Pt demonstrates improving strength, able to stand and take pivotal steps with minA+2, though remains limited by tenuous respiratory status and significant anxiety related to this. Educ re: therex/HEP, activity recommendations and importance of mobility. Pt remains limited by generalized weakness, decreased activity tolerance, and impaired balance strategies/postural reactions. Will continue to follow acutely to address established goals.     If plan is discharge home, recommend the following: A lot of help with walking and/or transfers;A lot of help with bathing/dressing/bathroom;Assist for transportation;Help with stairs or ramp for entrance   Can travel by private vehicle     No  Equipment Recommendations  Other (comment) (defer to next venue)    Recommendations for Other Services       Precautions / Restrictions Precautions Precautions: Fall;Other (comment) Precaution Comments: 40L O2 HHFNC at 80% FiO2; high anxiety with SOB Restrictions Weight Bearing Restrictions: No     Mobility  Bed Mobility               General bed mobility comments: received sitting EOB with RN    Transfers Overall transfer level: Needs assistance Equipment used: 2 person hand held assist Transfers: Sit to/from Stand, Bed to  chair/wheelchair/BSC Sit to Stand: Min assist, +2 physical assistance, +2 safety/equipment   Step pivot transfers: Min assist, +2 physical assistance, +2 safety/equipment       General transfer comment: pt declined RW use, minA+2 for HHA to elevate trunk and maintain balance with pivotal steps to recliner; pt with forward flexed posture and significant SOB    Ambulation/Gait               General Gait Details: pt declined secondary to SOB and fatigue   Stairs             Wheelchair Mobility     Tilt Bed    Modified Rankin (Stroke Patients Only)       Balance Overall balance assessment: Needs assistance Sitting-balance support: No upper extremity supported, Feet supported Sitting balance-Leahy Scale: Fair     Standing balance support: Bilateral upper extremity supported, During functional activity, Reliant on assistive device for balance, Single extremity supported Standing balance-Leahy Scale: Poor Standing balance comment: reliant on UE support and external assist                            Cognition Arousal: Alert Behavior During Therapy: Anxious Overall Cognitive Status: Within Functional Limits for tasks assessed                                 General Comments: WFL for majority of simple tasks, though anxious regarding SOB and discomfort; not formally assessed        Exercises Other Exercises Other Exercises: [employer] HEP handout (Access Code [email or web address]) provided - SLR, heel slide, hip abd, knee to chest, LAQ - pt reading through  handout and reports understanding, questions answered, but declines to perform at this time due to SOB and fatigue    General Comments General comments (skin integrity, edema, etc.): SpO2 89-93% on 40L O2 HHFNC at 80% FiO2. pt with significant SOB and c/o fatigue after transfer to recliner, requesting 15 min rest before more activity; PT returned to progress mobility but pt still declines,  agreeable to education on BLE therex and HEP handout provided. discussed plan for next session and pt agreeable to try walking next session      Pertinent Vitals/Pain Pain Assessment Pain Assessment: Faces Faces Pain Scale: Hurts even more Pain Location: stomach, back Pain Descriptors / Indicators: Grimacing, Guarding Pain Intervention(s): Monitored during session, Limited activity within patient's tolerance, Repositioned    Home Living                          Prior Function            PT Goals (current goals can now be found in the care plan section) Progress towards PT goals: Progressing toward goals    Frequency    Min 1X/week      PT Plan      Co-evaluation              AM-PAC PT "6 Clicks" Mobility   Outcome Measure  Help needed turning from your back to your side while in a flat bed without using bedrails?: A Little Help needed moving from lying on your back to sitting on the side of a flat bed without using bedrails?: A Little Help needed moving to and from a bed to a chair (including a wheelchair)?: A Lot Help needed standing up from a chair using your arms (e.g., wheelchair or bedside chair)?: A Lot Help needed to walk in hospital room?: Total Help needed climbing 3-5 steps with a railing? : Total 6 Click Score: 12    End of Session Equipment Utilized During Treatment: Oxygen Activity Tolerance: Patient limited by fatigue;Treatment limited secondary to medical complications (Comment) (SOB) Patient left: in chair;with call bell/phone within reach Nurse Communication: Mobility status PT Visit Diagnosis: Unsteadiness on feet (R26.81);Muscle weakness (generalized) (M62.81);Difficulty in walking, not elsewhere classified (R26.2)     Time: 1610-9604 PT Time Calculation (min) (ACUTE ONLY): 21 min  Charges:    $Therapeutic Activity: 8-22 mins PT General Charges $$ ACUTE PT VISIT: 1 Visit                     Ina Homes, PT, DPT Acute  Rehabilitation Services  Personal: Secure Chat Rehab Office: [phone]  Malachy Chamber 05/06/2023, 3:17 PM

## 2023-05-06 NOTE — Progress Notes (Signed)
Brief Nutrition Follow-up:  Pt remains on HFNC.   Pt continues on CL diet for comfort/pleasure per Surgery. Per I/O pt drank his allowed 1.2 L yesterday. Unclear how much pt will absorb of the CL fluids given GJ anastomotic fistula to colon  RN reports pt with abdominal cramping, not tolerating. RN discontinued, agree with this plan as po diet is for pleasure only.   Noted pt with 2 type 6 BMs on 8/05  TPN continues at 65 ml/hr, no sodium in bag. Receiving KCl supplementation outside the bag.   Pt remains on lasix; UOP 4L in 24 hours, but only net negative 184 mL in 24 hours. Noted 1.4 L thus far today.   Sodium trending down, 147 today. Creatinine 0.60, BUN 29, potassium 3.5.   Interventions: 1) Continue TPN at goal rate to meet 100% nutritional needs Continue to monitor fluid status and electrolytes   [name] MS, RDN, LDN, CNSC Registered Dietitian 3 Clinical Nutrition RD Pager and On-Call Pager Number Located in [place]

## 2023-05-07 ENCOUNTER — Inpatient Hospital Stay (HOSPITAL_COMMUNITY): Payer: BLUE CROSS/BLUE SHIELD

## 2023-05-07 DIAGNOSIS — N3001 Acute cystitis with hematuria: Secondary | ICD-10-CM | POA: Diagnosis not present

## 2023-05-07 DIAGNOSIS — E871 Hypo-osmolality and hyponatremia: Secondary | ICD-10-CM | POA: Diagnosis not present

## 2023-05-07 DIAGNOSIS — I509 Heart failure, unspecified: Secondary | ICD-10-CM

## 2023-05-07 DIAGNOSIS — J9601 Acute respiratory failure with hypoxia: Secondary | ICD-10-CM | POA: Diagnosis not present

## 2023-05-07 LAB — ECHOCARDIOGRAM COMPLETE
AR max vel: 3.47 cm2
AV Area VTI: 3.36 cm2
AV Area mean vel: 3.26 cm2
AV Mean grad: 4 mmHg
AV Peak grad: 7.1 mmHg
Ao pk vel: 1.33 m/s
Area-P 1/2: 4.06 cm2
Est EF: 55
Height: 75 in
S' Lateral: 2.7 cm
Weight: 2123.47 oz

## 2023-05-07 LAB — BASIC METABOLIC PANEL
Anion gap: 9 (ref 5–15)
BUN: 26 mg/dL — ABNORMAL HIGH (ref 6–20)
CO2: 25 mmol/L (ref 22–32)
Calcium: 7.8 mg/dL — ABNORMAL LOW (ref 8.9–10.3)
Chloride: 104 mmol/L (ref 98–111)
Creatinine, Ser: 0.59 mg/dL — ABNORMAL LOW (ref 0.61–1.24)
GFR, Estimated: >60 mL/min (ref >60)
Glucose, Bld: 145 mg/dL — ABNORMAL HIGH (ref 70–99)
Potassium: 4.1 mmol/L (ref 3.5–5.1)
Sodium: 138 mmol/L (ref 135–145)

## 2023-05-07 LAB — GLUCOSE, CAPILLARY
Glucose-Capillary: 155 mg/dL — ABNORMAL HIGH (ref 70–99)
Glucose-Capillary: 142 mg/dL — ABNORMAL HIGH (ref 70–99)
Glucose-Capillary: 153 mg/dL — ABNORMAL HIGH (ref 70–99)
Glucose-Capillary: 159 mg/dL — ABNORMAL HIGH (ref 70–99)
Glucose-Capillary: 139 mg/dL — ABNORMAL HIGH (ref 70–99)

## 2023-05-07 LAB — HEPARIN ANTI-XA: Heparin LMW: 0.27 IU/mL

## 2023-05-07 MED ORDER — SENNOSIDES-DOCUSATE SODIUM 8.6-50 MG PO TABS
1.0000 | ORAL_TABLET | Freq: Every day | ORAL | Status: DC
  Administered 2023-05-07 – 2023-05-21 (×15): 1 via ORAL
  Filled 2023-05-07 (×15): qty 1

## 2023-05-07 MED ORDER — POTASSIUM CHLORIDE 10 MEQ/50ML IV SOLN: 10.0000 meq | INTRAVENOUS | Status: DC

## 2023-05-07 MED ORDER — POTASSIUM CHLORIDE 10 MEQ/50ML IV SOLN
10.0000 meq | INTRAVENOUS | Status: AC
  Administered 2023-05-07 (×4): 10 meq via INTRAVENOUS
  Filled 2023-05-07 (×4): qty 50

## 2023-05-07 MED ORDER — TRACE MINERALS CU-MN-SE-ZN 300-55-60-3000 MCG/ML IV SOLN
INTRAVENOUS | Status: AC
  Filled 2023-05-07: qty 821.6

## 2023-05-07 MED ORDER — MAGNESIUM SULFATE 2 GM/50ML IV SOLN
2.0000 g | Freq: Once | INTRAVENOUS | Status: AC
  Administered 2023-05-07: 2 g via INTRAVENOUS
  Filled 2023-05-07: qty 50

## 2023-05-07 MED ORDER — TRAZODONE HCL 50 MG PO TABS
50.0000 mg | ORAL_TABLET | Freq: Every day | ORAL | Status: DC
  Administered 2023-05-07 – 2023-05-19 (×13): 50 mg via ORAL
  Filled 2023-05-07 (×14): qty 1

## 2023-05-07 MED ORDER — MORPHINE SULFATE (PF) 2 MG/ML IV SOLN
1.0000 mg | INTRAVENOUS | Status: DC | PRN
  Administered 2023-05-07 – 2023-05-10 (×11): 1 mg via INTRAVENOUS
  Filled 2023-05-07 (×10): qty 1

## 2023-05-07 MED ORDER — ENOXAPARIN SODIUM 80 MG/0.8ML IJ SOSY
70.0000 mg | PREFILLED_SYRINGE | Freq: Two times a day (BID) | INTRAMUSCULAR | Status: DC
  Administered 2023-05-07 – 2023-05-12 (×11): 70 mg via SUBCUTANEOUS
  Filled 2023-05-07 (×12): qty 0.7

## 2023-05-07 MED ORDER — POTASSIUM PHOSPHATES 15 MMOLE/5ML IV SOLN
15.0000 mmol | Freq: Once | INTRAVENOUS | Status: AC
  Administered 2023-05-07: 15 mmol via INTRAVENOUS
  Filled 2023-05-07: qty 5

## 2023-05-07 NOTE — Progress Notes (Signed)
PHARMACY - TOTAL PARENTERAL NUTRITION CONSULT NOTE  Indication: Gastrocolic fistula   Patient Measurements: Height: 6\' 3"  (190.5 cm) Weight: 60.2 kg (132 lb 11.5 oz) IBW/kg (Calculated) : 84.5 TPN AdjBW (KG): 53.2 Body mass index is 16.59 kg/m. 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  Patient presented on 7/19 with N/V x 4 days along with LLQ pain.  CT showed thickening of the distal transverse colon with complex fistulous connection between the transverse colon to the gastrojejunal anastomosis and adjacent small bowel loops.  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  Pharmacy consulted to manage TPN.  Patient was started on CLD on 7/22 and reported fullness/abdominal pain after eating.  He reports not eating for a week PTA.  He does not weigh himself, but he knows he has lost a lot of weight; his pants size reduced from a 36 to a 28 over a 6 month time frame.  He typically eats 2 meals a day (lunch and dinner) and he eats protein, vegetables and carb; he rarely eats fruits.   Glucose / Insulin: no hx DM - CBG <180. Stopped SSI 8/4.  7/31 - significant hypoglycemia on cyclic TPN   8/1 - insulin removed from TPN and BG stablized Electrolytes: Na down 142 (none in TPN, taking in oral water), K 3.5 (Lasix 40 IV BID, received a total of outside TPN yesterday), Phos down to 2.2, others WNL Renal: SCr < 1, BUN 27 Hepatic: LFTs / TG WNL, tbili up to 1.4, albumin 2.1, pre-albumin <5 Intake / Output; MIVF: UOP 2.6 ml/kg/hr with Lasix, net - . Still with anasarca, LBM 8/5 GI Imaging:  7/25 CT: severe fatty liver, thickened appearance of colon GI Surgeries / Procedures: none since TPN initiation  Central access: PICC placed 04/21/23 TPN start date: 04/22/23  Nutritional Goals: Goal concentrated TPN rate is 65 mL/hr (provides 123g AA  and 1930 kCal per day)    8/1 RD Estimated Needs Total Energy Estimated Needs: 1900-2000 kcals via IC Total Protein Estimated Needs: 110-125 g Total Fluid Estimated Needs: >/= 2L  Current Nutrition:  Clear liquids (for pleasure, poor absorption expected)  TPN  Plan:  Continue concentrated TPN at goal rate 65 ml/hr, meeting 100% of estimated needs Electrolytes in TPN: Na 0 mEq/L, K 80 mEq/L (= 125 mEq/day), Ca 4 mEq/L, max Mag 15 mEq/L (= ~3 g/day), increase to max Phos at 30 mmol/L (= 47 mmol/day), max acetate  Add daily multivitamin and trace elements in TPN D/C SSI.  Continue CBG checks BID. Monitor diuresis plan - replace KCL outside of TPN  Mag sulfate 2gm IV, KPhos , KCL x 4 runs ordered (has been getting ~154mEq/day outside of TPN) Standard TPN labs every Mon/Thurs - labs in AM  GJ-colonic fistula repair when nutrition status improved per Surgery  Damauri Minion D. Laney Potash, PharmD, BCPS, BCCCP 05/07/2023, 8:19 AM

## 2023-05-07 NOTE — Progress Notes (Signed)
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  Attempting conservative management and TPN to try to help nutritional status.  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  Denies any frank aspiration, breathing worsened after a BM.  CXR with worsening bilateral infiltrates. After discussion with rapid response and crosscover TRH, PCCM consulted to assist with management.  Patient states breathing is stable from this am, has not been able to sleep due to anxiety over SOB.  2022 echo normal  Pertinent  Medical History  Chronic pancreatitis Prior Whipple Severe protein calorie malnutrition Question of alcohol abuse but multiple previous notes state his use is modest at best.  I do see a level of 134 back in 2021. HTN  Significant Hospital Events: Including procedures, antibiotic start and stop dates in addition to other pertinent events   7/19 admit 7/26 thoracentesis with 800 cc of fluid obtained 7/29 tolerating SBT 7/30 self extubated during SBT. 7/31 reintubated for increased respiratory distress. 8/1 failed SBT, significant agitation  8/2 Failed SBT again this am due to agitation, remains on fentanyl and propofol   Interim History / Subjective:  Patient stated slept only for 3 hours because people coming and started waking me up  Continue to complain of bilateral foot pain  Stated breathing is better, not coughing as much    Objective   Blood pressure (!) 133/96, pulse (!) 102, temperature 99.7 F (37.6 C), temperature source Oral, resp. rate (!) 27, height 6\' 3"  (1.905 m), weight 60.2 kg, SpO2 96%.    FiO2  (%):  [70 %-80 %] 80 %   Intake/Output Summary (Last 24 hours) at 05/07/2023 0737 Last data filed at 05/07/2023 0600 Gross per 24 hour  Intake 3448.28 ml  Output 3690 ml  Net -241.72 ml   Filed Weights   05/05/23 0417 05/06/23 0500 05/07/23 0500  Weight: 60.1 kg 58.5 kg 60.2 kg    Examination: Physical exam: General: Chronically ill-appearing cachectic male, lying on the bed HEENT: [place]/AT, eyes anicteric.  moist mucus membranes Neuro: Alert, awake following commands Chest: Tachypneic, reduced air entry at the bases bilaterally, no wheezes or rhonchi Heart: Tachycardic, regular rhythm no murmurs or gallops Abdomen: Soft, nontender, nondistended, bowel sounds present Skin: No rash Extremities: 2+ pitting edema in bilateral lower extremities  Labs and images were reviewed  Resolved problems:  Septic shock  Severe leukemoid reaction  Hyponatremia  Hypophosphatemia Hypomagnesemia ARDS Acute urinary tract infection with Klebsiella Right pleural effusion s/p thoracentesis  Assessment & Plan:  Acute hypoxic respiratory failure Recurrent aspiration pneumonia Received Ceftriaxone 7/19-7/25, Flagyl 7/19-7/25, Zosyn 7/25-7/30, Vanc 7/26-7/28, and Micafungin 7/26-7/28 Continue to require high flow nasal cannula oxygen, this morning he was on 40 L and 80% FiO2, titrated down to 30 L and 70%, tolerating well with O2 sat in low 90s Continue to titrate heated high flow nasal cannula oxygen with O2 sat goal 88-92% Cough and shortness of breath is improving Respiratory culture was lost by lab Continue vancomycin and meropenem to complete total 7 days therapy Remained afebrile White count is stable but elevated  Gastrocolic fistula, being manage conservatively for now Will likely need surgical repair once  medically optimized  Acute colitis Pancreatic mass status post Whipple's 2015 Severe protein calorie malnutrition  General Surgery has signed off for now Continue clear liquid  diet Continue TPN Per surgery  Supportive care  Optimize electrolytes   Obstipation Last bowel movement was 3 days ago Continue aggressive bowel regimen  Hypernatremia, likely due to aggressive diuresis with albumin, improved Hypokalemia, improved Hypophosphatemia Serum sodium is improving, currently at 142 Continue aggressive electrolyte replacement  Anemia of critical illness Thrombocytopenia in the setting of sepsis  Monitor H&H and platelet count Platelet counts continue to improve HIT panel is negative  DVT of unknown duration Continue therapeutic Lovenox  Best Practice (right click and "Reselect all SmartList Selections" daily)   Diet/type: TPN DVT prophylaxis: Therapeutic Lovenox GI prophylaxis: PPI Lines: Central line for TPN Foley:  N/A Code Status:  full code Last date of multidisciplinary goals of care discussion: 8/8: Patient was updated at bedside   The patient is critically ill due to acute respiratory failure with hypoxia.  Critical care was necessary to treat or prevent imminent or life-threatening deterioration.  Critical care was time spent personally by me on the following activities: development of treatment plan with patient and/or surrogate as well as nursing, discussions with consultants, evaluation of patient's response to treatment, examination of patient, obtaining history from patient or surrogate, ordering and performing treatments and interventions, ordering and review of laboratory studies, ordering and review of radiographic studies, pulse oximetry, re-evaluation of patient's condition and participation in multidisciplinary rounds.   During this encounter critical care time was devoted to patient care services described in this note for 31 minutes.     Cheri Fowler, MD [facility] See Amion for pager If no response to pager, please call [phone] until 7pm After 7pm, Please call E-link [phone]

## 2023-05-08 ENCOUNTER — Inpatient Hospital Stay (HOSPITAL_COMMUNITY): Payer: BLUE CROSS/BLUE SHIELD

## 2023-05-08 DIAGNOSIS — K529 Noninfective gastroenteritis and colitis, unspecified: Secondary | ICD-10-CM | POA: Diagnosis not present

## 2023-05-08 DIAGNOSIS — K316 Fistula of stomach and duodenum: Secondary | ICD-10-CM

## 2023-05-08 DIAGNOSIS — J9601 Acute respiratory failure with hypoxia: Secondary | ICD-10-CM | POA: Diagnosis not present

## 2023-05-08 LAB — GLUCOSE, CAPILLARY
Glucose-Capillary: 140 mg/dL — ABNORMAL HIGH (ref 70–99)
Glucose-Capillary: 147 mg/dL — ABNORMAL HIGH (ref 70–99)
Glucose-Capillary: 153 mg/dL — ABNORMAL HIGH (ref 70–99)
Glucose-Capillary: 158 mg/dL — ABNORMAL HIGH (ref 70–99)
Glucose-Capillary: 170 mg/dL — ABNORMAL HIGH (ref 70–99)
Glucose-Capillary: 179 mg/dL — ABNORMAL HIGH (ref 70–99)
Glucose-Capillary: 206 mg/dL — ABNORMAL HIGH (ref 70–99)

## 2023-05-08 LAB — BASIC METABOLIC PANEL
Anion gap: 9 (ref 5–15)
BUN: 24 mg/dL — ABNORMAL HIGH (ref 6–20)
CO2: 26 mmol/L (ref 22–32)
Calcium: 7.9 mg/dL — ABNORMAL LOW (ref 8.9–10.3)
Chloride: 103 mmol/L (ref 98–111)
Creatinine, Ser: 0.51 mg/dL — ABNORMAL LOW (ref 0.61–1.24)
GFR, Estimated: >60 mL/min (ref >60)
Glucose, Bld: 155 mg/dL — ABNORMAL HIGH (ref 70–99)
Potassium: 3.1 mmol/L — ABNORMAL LOW (ref 3.5–5.1)
Sodium: 138 mmol/L (ref 135–145)

## 2023-05-08 MED ORDER — MAGNESIUM SULFATE 2 GM/50ML IV SOLN
2.0000 g | Freq: Once | INTRAVENOUS | Status: AC
  Administered 2023-05-08: 2 g via INTRAVENOUS
  Filled 2023-05-08: qty 50

## 2023-05-08 MED ORDER — POTASSIUM CHLORIDE 10 MEQ/50ML IV SOLN
10.0000 meq | INTRAVENOUS | Status: AC
  Administered 2023-05-08 – 2023-05-09 (×8): 10 meq via INTRAVENOUS
  Filled 2023-05-08 (×8): qty 50

## 2023-05-08 MED ORDER — BISACODYL 10 MG RE SUPP
10.0000 mg | Freq: Once | RECTAL | Status: AC
  Administered 2023-05-08: 10 mg via RECTAL
  Filled 2023-05-08: qty 1

## 2023-05-08 MED ORDER — POTASSIUM CHLORIDE 10 MEQ/50ML IV SOLN
10.0000 meq | INTRAVENOUS | Status: AC
  Administered 2023-05-08 (×4): 10 meq via INTRAVENOUS
  Filled 2023-05-08 (×4): qty 50

## 2023-05-08 MED ORDER — TRACE MINERALS CU-MN-SE-ZN 300-55-60-3000 MCG/ML IV SOLN
INTRAVENOUS | Status: DC
  Filled 2023-05-08: qty 821.6

## 2023-05-08 MED ORDER — POTASSIUM CHLORIDE 10 MEQ/50ML IV SOLN
10.0000 meq | INTRAVENOUS | Status: DC
  Administered 2023-05-08 (×2): 10 meq via INTRAVENOUS
  Filled 2023-05-08 (×2): qty 50

## 2023-05-08 NOTE — Progress Notes (Signed)
PHARMACY - TOTAL PARENTERAL NUTRITION CONSULT NOTE  Indication: Gastrocolic fistula   Patient Measurements: Height: 6\' 3"  (190.5 cm) Weight: 58.5 kg (128 lb 15.5 oz) IBW/kg (Calculated) : 84.5 TPN AdjBW (KG): 53.2 Body mass index is 16.12 kg/m. 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  Patient presented on 7/19 with N/V x 4 days along with LLQ pain.  CT showed thickening of the distal transverse colon with complex fistulous connection between the transverse colon to the gastrojejunal anastomosis and adjacent small bowel loops.  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  Pharmacy consulted to manage TPN.  Patient was started on CLD on 7/22 and reported fullness/abdominal pain after eating.  He reports not eating for a week PTA.  He does not weigh himself, but he knows he has lost a lot of weight; his pants size reduced from a 36 to a 28 over a 6 month time frame.  He typically eats 2 meals a day (lunch and dinner) and he eats protein, vegetables and carb; he rarely eats fruits.   Glucose / Insulin: no hx DM - CBG <180. Stopped SSI 8/4.  7/31 - significant hypoglycemia on cyclic TPN   8/1 - insulin removed from TPN and BG stablized Electrolytes: Na down 139 (none in TPN, taking in oral water), K 3.3 and Phos 2.6 post KPhos and KCL x 4 runs (Lasix 40 IV BID, received a total of outside TPN yesterday), Mag remains at 1.9 post 2gm, others WNL Renal: SCr < 1, BUN 27 Hepatic: LFTs / TG WNL, tbili up to 1.4, albumin 2.1, pre-albumin <5 Intake / Output; MIVF: UOP 2.6 ml/kg/hr with Lasix, net - . Still with anasarca, LBM 8/5 GI Imaging:  7/25 CT: severe fatty liver, thickened appearance of colon GI Surgeries / Procedures: none since TPN initiation  Central access: PICC placed 04/21/23 TPN start date: 04/22/23  Nutritional  Goals: Goal concentrated TPN rate is 65 mL/hr (provides 123g AA and 1930 kCal per day)    8/1 RD Estimated Needs Total Energy Estimated Needs: 1900-2000 kcals via IC Total Protein Estimated Needs: 110-125 g Total Fluid Estimated Needs: >/= 2L  Current Nutrition:  Clear liquids (for pleasure, poor absorption expected)  TPN  Plan:  Continue concentrated TPN at goal rate 65 ml/hr, meeting 100% of estimated needs Electrolytes in TPN: Na 0 mEq/L, K 80 mEq/L (= 125 mEq/day), Ca 4 mEq/L, max Mag 15 mEq/L (= ~3 g/day), increase to max Phos at 30 mmol/L on 8/8 (= 47 mmol/day), max acetate  Add daily multivitamin and trace elements in TPN D/C SSI.  Continue CBG checks BID. Monitor diuresis plan - replace KCL outside of TPN  Mag sulfate 2gm IV, KCL x 6 runs (was getting ~157mEq/day outside of TPN for several days) Standard TPN labs every Mon/Thurs - labs in AM (may need to add back Na to TPN soon)  GJ-colonic fistula repair when nutrition status improved per Surgery  Gene Glazebrook D. Laney Potash, PharmD, BCPS, BCCCP 05/08/2023, 8:46 AM

## 2023-05-08 NOTE — Progress Notes (Signed)
Pharmacy Antibiotic Note  Brent Rogers is a 57 y.o. male admitted on 04/17/2023 GI fistula now with purulent sputum possible  pneumonia.  Pharmacy has been consulted for meropenem dosing. Vancomycin was added 8/6 with increased WBC and ongoing O2 requirements.   On day #6 of meropenem, day #4 of vancomycin - tentatively planning for 7 days of antibiotics. WBC remains elevated at 21, Tmax 99. Scr up slightly to 0.86 (CrCl 78 mL/min).   Plan: Continue meropenem 1000 mg every 8 hours Continue vancomycin 1250mg  IV q24h (est AUC 488) Stop date for 7 days added to orders Monitor fever, WBC, O2 requirements, and sputum characteristics Monitor renal function for appropriate dosing.  Height: 6\' 3"  (190.5 cm) Weight: 58.5 kg (128 lb 15.5 oz) IBW/kg (Calculated) : 84.5  Temp (24hrs), Avg:98.8 F (37.1 C), Min:96.6 F (35.9 C), Max:99.9 F (37.7 C)  Recent Labs  Lab 05/04/23 0651 05/04/23 1740 05/05/23 0138 05/05/23 1611 05/06/23 0400 05/06/23 1901 05/07/23 0635 05/07/23 2117 05/08/23 0111  WBC 17.9*  --  20.2*  --  22.0*  --  21.4*  --  21.1*  CREATININE 0.83   < > 0.76   < > 0.60* 0.64 0.61 0.59* 0.86   < > = values in this interval not displayed.    Estimated Creatinine Clearance: 78.4 mL/min (by C-G formula based on SCr of 0.86 mg/dL).    No Known Allergies  Antimicrobials this admission: Meropenem 8/4 >> [8/10] Zosyn 7/25 >> 7/30 Vancomycin 7/26 >> 7/27, 8/6> [8/12] Micafungin 7/26 >> 7/27 Ceftriaxone 7/19 >> 7/25 Metronidazole 7/19 >> 7/25  Dose adjustments this admission: N/A   Microbiology results: 7/20 Ucx >100k kleb pneumo (S to CTX) 7/25 BCx - NGTD 7/27 TA - no growth  Thank you for allowing pharmacy to participate in this patient's care,  Sherron Monday, PharmD, BCCCP Clinical Pharmacist  Phone: [phone] 05/08/2023 9:28 AM  Please check AMION for all [facility] Pharmacy phone numbers After 10:00 PM, call Main Pharmacy [phone]

## 2023-05-08 NOTE — Progress Notes (Signed)
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  Attempting conservative management and TPN to try to help nutritional status.  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  Denies any frank aspiration, breathing worsened after a BM.  CXR with worsening bilateral infiltrates. After discussion with rapid response and crosscover TRH, PCCM consulted to assist with management.  Patient states breathing is stable from this am, has not been able to sleep due to anxiety over SOB.  2022 echo normal  Pertinent  Medical History  Chronic pancreatitis Prior Whipple Severe protein calorie malnutrition Question of alcohol abuse but multiple previous notes state his use is modest at best.  I do see a level of 134 back in 2021. HTN  Significant Hospital Events: Including procedures, antibiotic start and stop dates in addition to other pertinent events   7/19 admit 7/26 thoracentesis with 800 cc of fluid obtained 7/29 tolerating SBT 7/30 self extubated during SBT. 7/31 reintubated for increased respiratory distress. 8/1 failed SBT, significant agitation  8/2 Failed SBT again this am due to agitation, remains on fentanyl and propofol   Interim History / Subjective:  Patient stated slept better, cough and shortness of breath has improved Continue to complain of bilateral foot pain  Remain on high flow nasal cannula oxygen Afebrile  Objective   Blood pressure (!) 136/97, pulse (!) 103, temperature 99.1 F (37.3 C), temperature source Oral, resp. rate (!) 28, height 6\' 3"  (1.905 m), weight 58.5 kg, SpO2 92%.    FiO2 (%):  [70 %-74  %] 70 %   Intake/Output Summary (Last 24 hours) at 05/08/2023 0826 Last data filed at 05/08/2023 0600 Gross per 24 hour  Intake 3109.09 ml  Output 3840 ml  Net -730.91 ml   Filed Weights   05/06/23 0500 05/07/23 0500 05/08/23 0340  Weight: 58.5 kg 60.2 kg 58.5 kg    Examination: General: Chronically ill-appearing unresponsive to male, lying on the bed HEENT: [place]/AT, eyes anicteric.  moist mucus membranes on high flow nasal cannula oxygen Neuro: Alert, awake following commands Chest: Coarse breath sounds, no wheezes or rhonchi Heart: Regular rate and rhythm, no murmurs or gallops Abdomen: Soft, nontender, nondistended, bowel sounds present Skin: Has foot wounds, POA     Labs and images were reviewed  Resolved problems:  Septic shock  Severe leukemoid reaction  Hyponatremia  Hypophosphatemia Hypomagnesemia ARDS Acute urinary tract infection with Klebsiella Right pleural effusion s/p thoracentesis Hypernatremia  Assessment & Plan:  Acute hypoxic respiratory failure Recurrent aspiration pneumonia Received Ceftriaxone 7/19-7/25, Flagyl 7/19-7/25, Zosyn 7/25-7/30, Vanc 7/26-7/28, and Micafungin 7/26-7/28 Continue to require high flow nasal cannula oxygen Now is on 25 L and 65% FiO2 with O2 sat in mid 90s X-ray chest was repeated showing persistent bilateral infiltrates right worse than left Continue broad-spectrum antibiotics with vancomycin and meropenem to complete 7 days therapy White count is elevated but is stable, not increasing  Gastrocolic fistula, being manage conservatively for now Will likely need surgical repair once medically optimized  Acute colitis Pancreatic mass status post Whipple's 2015 Severe protein calorie malnutrition  General Surgery has signed off for now Continue clear liquid diet Continue TPN Per surgery  Supportive care  Optimize electrolytes   Obstipation Last bowel movement was 3 days ago Continue aggressive bowel  regimen  Hypokalemia Hypophosphatemia, corrected Serum sodium is improving, currently at 139 Continue aggressive electrolyte replacement  Anemia of critical illness Thrombocytopenia in the setting of sepsis  Monitor H&H and platelet count Platelet counts continue to improve HIT panel is negative  DVT of unknown duration Continue therapeutic Lovenox  Bilateral foot wounds, POA Continue pain meds Continue wound care  Best Practice (right click and "Reselect all SmartList Selections" daily)   Diet/type: TPN, clear liquid diet DVT prophylaxis: Therapeutic Lovenox GI prophylaxis: PPI Lines: Central line for TPN Foley:  N/A Code Status:  full code Last date of multidisciplinary goals of care discussion: 8/8: Patient was updated at bedside   The patient is critically ill due to acute respiratory failure with hypoxia.  Critical care was necessary to treat or prevent imminent or life-threatening deterioration.  Critical care was time spent personally by me on the following activities: development of treatment plan with patient and/or surrogate as well as nursing, discussions with consultants, evaluation of patient's response to treatment, examination of patient, obtaining history from patient or surrogate, ordering and performing treatments and interventions, ordering and review of laboratory studies, ordering and review of radiographic studies, pulse oximetry, re-evaluation of patient's condition and participation in multidisciplinary rounds.   During this encounter critical care time was devoted to patient care services described in this note for 32 minutes.     Cheri Fowler, MD [facility] See Amion for pager If no response to pager, please call [phone] until 7pm After 7pm, Please call E-link [phone]

## 2023-05-08 NOTE — Progress Notes (Signed)
OT Cancellation Note  Patient Details Name: MICHA VANVLIET MRN: [id number] DOB: 04/08/1966   Cancelled Treatment:    Reason Eval/Treat Not Completed: Patient declined, no reason specified.  Patient up in recliner, not willing to try HEP of mobility.  OT will continue efforts as schedule allows.    Matvey Llanas D Jaidyn Usery 05/08/2023, 11:00 AM 05/08/2023  RP, OTR/L  Acute Rehabilitation Services  Office:  [phone]

## 2023-05-08 NOTE — Progress Notes (Signed)
Physical Therapy Treatment Patient Details Name: Brent Rogers MRN: [id number] DOB: [birth year] Today's Date: 05/08/2023   History of Present Illness Pt is a 57 y.o. 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 Conservative management with TPN with potential for eventual surgery. Rapid response called, on BiPAP and s/p thoracentesis 7/26. Intubated 7/27 and self-extubated 7/31, reintubated 7/31-8/3. PMH includes HTN, CAD, GERD, PUD, s/p cholecystectomy, partial pancreatectomy, anxiety, depression, chronic pain, scoliosis, ETOH abuse.   PT Comments  Pt slowly progressing with mobility. Pt able to stand and take steps with RW and minA, tolerating 5-10-sec bouts of activity before needing prolonged seated rest breaks to recover WOB with RR up to 54. Pt difficult to reason with regarding importance of more frequent upright mobility. Pt remains limited by generalized weakness, decreased activity tolerance, and impaired balance strategies/postural reactions. Will continue to follow acutely to address established goals.     If plan is discharge home, recommend the following: A lot of help with walking and/or transfers;A lot of help with bathing/dressing/bathroom;Assist for transportation;Help with stairs or ramp for entrance   Can travel by private vehicle     No  Equipment Recommendations  Other (comment) (defer to next venue)    Recommendations for Other Services       Precautions / Restrictions Precautions Precautions: Fall;Other (comment) Precaution Comments: 30L O2 HHFNC at 70% FiO2; high anxiety with SOB Restrictions Weight Bearing Restrictions: No     Mobility  Bed Mobility Overal bed mobility: Needs Assistance Bed Mobility: Sit to Supine       Sit to supine: Supervision   General bed mobility comments: pt impulsively returning to supine despite max encouragement to remain sitting EOB while recovering  SOB after standing activity    Transfers Overall transfer level: Needs assistance Equipment used: Rolling walker (2 wheels) Transfers: Sit to/from Stand, Bed to chair/wheelchair/BSC Sit to Stand: Min assist   Step pivot transfers: Min assist       General transfer comment: 2x sit<>stand from recliner to RW, reliant on momentum and minA to power up to standing; pivotal steps (~4') from recliner to bed with RW and minA, pt impulsive with movement (suspect related to anxiety regarding SOB and fatigue) requiring max cues for safety taking steps backwards to sit and to not sit prematurely    Ambulation/Gait             Pre-gait activities: pt can tolerate static standing 5-10 sec before adamant about need for seated rest break and RR up to 40s-50s General Gait Details: pt adamantly declines ambulation attempts   Stairs             Wheelchair Mobility     Tilt Bed    Modified Rankin (Stroke Patients Only)       Balance Overall balance assessment: Needs assistance Sitting-balance support: No upper extremity supported, Feet supported Sitting balance-Leahy Scale: Fair     Standing balance support: Bilateral upper extremity supported, During functional activity, Reliant on assistive device for balance, Single extremity supported Standing balance-Leahy Scale: Poor Standing balance comment: reliant on UE support                            Cognition Arousal: Alert Behavior During Therapy: Flat affect, Anxious  General Comments: WFL for majority of simple tasks, though anxious regarding SOB and discomfort, difficult to motivate to progress mobility        Exercises      General Comments General comments (skin integrity, edema, etc.): SpO2 down to 86% on 25L O2 HHFNC at 65% FiO2; pt requiring >5-min to recover between 5-10 sec bouts of activity with RR up to 50s during initial stand. SpO2 down to 83%  after transfer back to bed, pt laying down flat and refusing to sit up to help O2 saturations despite education      Pertinent Vitals/Pain Pain Assessment Pain Assessment: Faces Faces Pain Scale: Hurts little more Pain Location: stomach, back Pain Descriptors / Indicators: Discomfort, Sore Pain Intervention(s): Monitored during session, Limited activity within patient's tolerance, Repositioned    Home Living                          Prior Function            PT Goals (current goals can now be found in the care plan section) Progress towards PT goals: Progressing toward goals (slowly)    Frequency    Min 1X/week      PT Plan      Co-evaluation              AM-PAC PT "6 Clicks" Mobility   Outcome Measure  Help needed turning from your back to your side while in a flat bed without using bedrails?: A Little Help needed moving from lying on your back to sitting on the side of a flat bed without using bedrails?: A Little Help needed moving to and from a bed to a chair (including a wheelchair)?: A Lot Help needed standing up from a chair using your arms (e.g., wheelchair or bedside chair)?: A Lot Help needed to walk in hospital room?: Total Help needed climbing 3-5 steps with a railing? : Total 6 Click Score: 12    End of Session Equipment Utilized During Treatment: Oxygen;Gait belt Activity Tolerance: Patient limited by fatigue;Treatment limited secondary to medical complications (Comment) Patient left: in bed;with call bell/phone within reach;with bed alarm set Nurse Communication: Mobility status PT Visit Diagnosis: Unsteadiness on feet (R26.81);Muscle weakness (generalized) (M62.81);Difficulty in walking, not elsewhere classified (R26.2)     Time: 4742-5956 PT Time Calculation (min) (ACUTE ONLY): 29 min  Charges:    $Therapeutic Activity: 23-37 mins PT General Charges $$ ACUTE PT VISIT: 1 Visit                      Ina Homes, PT,  DPT Acute Rehabilitation Services  Personal: Secure Chat Rehab Office: [phone]  Malachy Chamber 05/08/2023, 4:42 PM

## 2023-05-08 NOTE — Progress Notes (Signed)
Pt refused BIPAP.

## 2023-05-08 NOTE — Progress Notes (Signed)
eLink Physician-Brief Progress Note Patient Name: FREDREICK SATTERWHITE DOB: 08/16/1966 MRN: [id number]   Date of Service  05/08/2023  HPI/Events of Note  Potassium 3.3, magnesium 1.9 TPN.  eICU Interventions  Will add on KCl IV.  Defer magnesium correction to TPN team.     Intervention Category Minor Interventions: Electrolytes abnormality - evaluation and management  Theseus Birnie 05/08/2023, 6:25 AM

## 2023-05-09 DIAGNOSIS — J9601 Acute respiratory failure with hypoxia: Secondary | ICD-10-CM | POA: Diagnosis not present

## 2023-05-09 DIAGNOSIS — K529 Noninfective gastroenteritis and colitis, unspecified: Secondary | ICD-10-CM | POA: Diagnosis not present

## 2023-05-09 LAB — GLUCOSE, CAPILLARY
Glucose-Capillary: 156 mg/dL — ABNORMAL HIGH (ref 70–99)
Glucose-Capillary: 155 mg/dL — ABNORMAL HIGH (ref 70–99)
Glucose-Capillary: 163 mg/dL — ABNORMAL HIGH (ref 70–99)
Glucose-Capillary: 148 mg/dL — ABNORMAL HIGH (ref 70–99)
Glucose-Capillary: 164 mg/dL — ABNORMAL HIGH (ref 70–99)
Glucose-Capillary: 134 mg/dL — ABNORMAL HIGH (ref 70–99)

## 2023-05-09 MED ORDER — TRACE MINERALS CU-MN-SE-ZN 300-55-60-3000 MCG/ML IV SOLN
INTRAVENOUS | Status: AC
  Filled 2023-05-09: qty 821.6

## 2023-05-09 MED ORDER — TRACE MINERALS CU-MN-SE-ZN 300-55-60-3000 MCG/ML IV SOLN: INTRAVENOUS | Status: DC

## 2023-05-09 MED ORDER — POTASSIUM CHLORIDE 10 MEQ/50ML IV SOLN
10.0000 meq | INTRAVENOUS | Status: AC
  Administered 2023-05-09 (×6): 10 meq via INTRAVENOUS
  Filled 2023-05-09 (×3): qty 50

## 2023-05-09 MED ORDER — INSULIN ASPART 100 UNIT/ML IJ SOLN
0.0000 [IU] | INTRAMUSCULAR | Status: DC
  Administered 2023-05-09 – 2023-05-14 (×14): 1 [IU] via SUBCUTANEOUS
  Administered 2023-05-14: 2 [IU] via SUBCUTANEOUS
  Administered 2023-05-15: 1 [IU] via SUBCUTANEOUS

## 2023-05-09 MED ORDER — MAGNESIUM SULFATE 2 GM/50ML IV SOLN
2.0000 g | Freq: Once | INTRAVENOUS | Status: AC
  Administered 2023-05-09: 2 g via INTRAVENOUS
  Filled 2023-05-09: qty 50

## 2023-05-09 MED ORDER — POTASSIUM PHOSPHATES 15 MMOLE/5ML IV SOLN
15.0000 mmol | Freq: Once | INTRAVENOUS | Status: AC
  Administered 2023-05-09: 15 mmol via INTRAVENOUS
  Filled 2023-05-09: qty 5

## 2023-05-09 NOTE — Progress Notes (Signed)
PHARMACY - TOTAL PARENTERAL NUTRITION CONSULT NOTE  Indication: Gastrocolic fistula   Patient Measurements: Height: 6\' 3"  (190.5 cm) Weight: 56.5 kg (124 lb 9 oz) IBW/kg (Calculated) : 84.5 TPN AdjBW (KG): 53.2 Body mass index is 15.57 kg/m. 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  Patient presented on 7/19 with N/V x 4 days along with LLQ pain.  CT showed thickening of the distal transverse colon with complex fistulous connection between the transverse colon to the gastrojejunal anastomosis and adjacent small bowel loops.  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  Pharmacy consulted to manage TPN.  Patient was started on CLD on 7/22 and reported fullness/abdominal pain after eating.  He reports not eating for a week PTA.  He does not weigh himself, but he knows he has lost a lot of weight; his pants size reduced from a 36 to a 28 over a 6 month time frame.  He typically eats 2 meals a day (lunch and dinner) and he eats protein, vegetables and carb; he rarely eats fruits.   Glucose / Insulin: no hx DM - CBG mostly <180. Stopped SSI 8/4.  7/31 - significant hypoglycemia on cyclic TPN   8/1 - insulin removed from TPN and BG stablized Electrolytes: Na 139 (none in TPN, taking in oral water), K 3.8 after outside TPN for total with TPN of 215 mEq yesterday and received 5 runs this AM, and Phos down 2.3 (Lasix 40 IV BID, received a total of outside TPN yesterday), Mag remains stable at 1.9 after 2g yesterday, others WNL Renal: SCr < 1, BUN 27 Hepatic: LFTs / TG WNL, tbili up to 1.4, albumin 2.1, pre-albumin <5 Intake / Output; MIVF: UOP 3 ml/kg/hr with Lasix, net - . Still with anasarca, LBM 8/9 GI Imaging:  7/25 CT: severe fatty liver, thickened appearance of colon GI Surgeries / Procedures: none since TPN  initiation  Central access: PICC placed 04/21/23 TPN start date: 04/22/23  Nutritional Goals: Goal concentrated TPN rate is 65 mL/hr (provides 123g AA and 1930 kCal per day)    8/1 RD Estimated Needs Total Energy Estimated Needs: 1900-2000 kcals via IC Total Protein Estimated Needs: 110-125 g Total Fluid Estimated Needs: >/= 2L  Current Nutrition:  Clear liquids (for pleasure, poor absorption expected)  TPN  Plan:  Continue concentrated TPN at goal rate 65 ml/hr, meeting 100% of estimated needs Electrolytes in TPN: Na 0 mEq/L, K 80 mEq/L (=125 mEq/day), Ca 4 mEq/L, max Mag 15 mEq/L (= ~3 g/day), max Phos at 30 mmol/L on 8/8 (= 47 mmol/day), max acetate  Add daily multivitamin and trace elements in TPN D/C SSI.  Continue CBG checks BID. Monitor diuresis plan - replace KCL outside of TPN  Standard TPN labs every Mon/Thurs - labs in AM (may need to add back Na to TPN soon)  KPhos 15 mmol x1 Magnesium 2g IV x1 GJ-colonic fistula repair when nutrition status improved per Surgery  Link Snuffer, PharmD, BCPS, BCCCP Clinical Pharmacist Please refer to [facility] for [facility] numbers 05/09/2023, 7:19 AM

## 2023-05-09 NOTE — Plan of Care (Signed)

## 2023-05-09 NOTE — Progress Notes (Signed)
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  Attempting conservative management and TPN to try to help nutritional status.  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  Denies any frank aspiration, breathing worsened after a BM.  CXR with worsening bilateral infiltrates. After discussion with rapid response and crosscover TRH, PCCM consulted to assist with management.  Patient states breathing is stable from this am, has not been able to sleep due to anxiety over SOB.  2022 echo normal  Pertinent  Medical History  Chronic pancreatitis Prior Whipple Severe protein calorie malnutrition Question of alcohol abuse but multiple previous notes state his use is modest at best.  I do see a level of 134 back in 2021. HTN  Significant Hospital Events: Including procedures, antibiotic start and stop dates in addition to other pertinent events   7/19 admit 7/26 thoracentesis with 800 cc of fluid obtained 7/29 tolerating SBT 7/30 self extubated during SBT. 7/31 reintubated for increased respiratory distress. 8/1 failed SBT, significant agitation  8/2 Failed SBT again this am due to agitation, remains on fentanyl and propofol   Interim History / Subjective:  Patient stated breathing is getting better Foot pain is better with morphine Continue to require heated high flow nasal cannula oxygen, currently on 30 L and 70%, Afebrile  Objective   Blood pressure (!) 133/102, pulse (!) 101, temperature 98.8 F (37.1 C), temperature source Oral, resp. rate (!) 27, height 6\' 3"  (1.905 m), weight 56.5 kg, SpO2 96%.    FiO2 (%):  [70  %] 70 %   Intake/Output Summary (Last 24 hours) at 05/09/2023 0955 Last data filed at 05/09/2023 0552 Gross per 24 hour  Intake 2685.18 ml  Output 3330 ml  Net -644.82 ml   Filed Weights   05/07/23 0500 05/08/23 0340 05/09/23 0537  Weight: 60.2 kg 58.5 kg 56.5 kg    Examination: General: Acute on chronically ill-appearing male, lying on the bed HEENT: [place]/AT, eyes anicteric.  moist mucus membranes Neuro: Alert, awake following commands.  Generalized weak Chest: Coarse breath sounds, no wheezes or rhonchi Heart: Regular rate and rhythm, no murmurs or gallops Abdomen: Soft, nontender, nondistended, bowel sounds present Skin: Bilateral foot wounds on the sole       Labs and images were reviewed  Resolved problems:  Septic shock  Severe leukemoid reaction  Hyponatremia  Hypophosphatemia Hypomagnesemia ARDS Acute urinary tract infection with Klebsiella Right pleural effusion s/p thoracentesis Hypernatremia  Assessment & Plan:  Acute hypoxic respiratory failure Recurrent aspiration pneumonia Received Ceftriaxone 7/19-7/25, Flagyl 7/19-7/25, Zosyn 7/25-7/30, Vanc 7/26-7/28, and Micafungin 7/26-7/28 Continue to require high flow nasal cannula oxygen Started desatting, currently on 30 L and 70% with O2 sat around 90% X-ray chest was repeated showing persistent bilateral infiltrates right worse than left Continue broad-spectrum antibiotics with vancomycin and meropenem to complete 7 days therapy Will get sputum culture White count is slowly trending down Continue to complain of productive cough Continue diuretics with Lasix  Gastrocolic fistula, being manage conservatively for now Will likely need surgical repair once medically optimized  Acute colitis Pancreatic mass status post Whipple's 2015 Severe protein calorie malnutrition  General Surgery has signed  off for now Continue clear liquid diet Continue TPN Per surgery  Supportive care   Obstipation Last bowel  movement was 3 days ago Continue aggressive bowel regimen  Hypokalemia Hypophosphatemia Hypomagnesemia Continue aggressive electrolyte replacement  Anemia of critical illness Thrombocytopenia in the setting of sepsis  Monitor H&H and platelet count Platelet counts continue to improve  DVT of unknown duration Continue therapeutic Lovenox  Bilateral foot wounds, POA Continue pain meds with IV morphine Continue wound care  Best Practice (right click and "Reselect all SmartList Selections" daily)   Diet/type: TPN, clear liquid diet DVT prophylaxis: Therapeutic Lovenox GI prophylaxis: PPI Lines: Central line for TPN Foley:  N/A Code Status:  full code Last date of multidisciplinary goals of care discussion: 8/8: Patient was updated at bedside   The patient is critically ill due to acute respiratory failure with hypoxia.  Critical care was necessary to treat or prevent imminent or life-threatening deterioration.  Critical care was time spent personally by me on the following activities: development of treatment plan with patient and/or surrogate as well as nursing, discussions with consultants, evaluation of patient's response to treatment, examination of patient, obtaining history from patient or surrogate, ordering and performing treatments and interventions, ordering and review of laboratory studies, ordering and review of radiographic studies, pulse oximetry, re-evaluation of patient's condition and participation in multidisciplinary rounds.   During this encounter critical care time was devoted to patient care services described in this note for 33 minutes.     Cheri Fowler, MD [place] [facility] See Amion for pager If no response to pager, please call [phone] until 7pm After 7pm, Please call E-link [phone]

## 2023-05-09 NOTE — Progress Notes (Signed)
eLink Physician-Brief Progress Note Patient Name: Brent Rogers DOB: 1965-12-06 MRN: [id number]   Date of Service  05/09/2023  HPI/Events of Note  Started TPN.  He is on liquid diet but eating very little.  Sugar elevating slightly.  eICU Interventions  Start SSI every 4 hrs     Intervention Category Intermediate Interventions: OtherDeanna Artis 05/09/2023, 8:33 PM

## 2023-05-10 DIAGNOSIS — E871 Hypo-osmolality and hyponatremia: Secondary | ICD-10-CM | POA: Diagnosis not present

## 2023-05-10 DIAGNOSIS — G894 Chronic pain syndrome: Secondary | ICD-10-CM | POA: Diagnosis not present

## 2023-05-10 DIAGNOSIS — J9601 Acute respiratory failure with hypoxia: Secondary | ICD-10-CM | POA: Diagnosis not present

## 2023-05-10 DIAGNOSIS — K529 Noninfective gastroenteritis and colitis, unspecified: Secondary | ICD-10-CM | POA: Diagnosis not present

## 2023-05-10 LAB — GLUCOSE, CAPILLARY
Glucose-Capillary: 155 mg/dL — ABNORMAL HIGH (ref 70–99)
Glucose-Capillary: 136 mg/dL — ABNORMAL HIGH (ref 70–99)
Glucose-Capillary: 156 mg/dL — ABNORMAL HIGH (ref 70–99)
Glucose-Capillary: 129 mg/dL — ABNORMAL HIGH (ref 70–99)
Glucose-Capillary: 140 mg/dL — ABNORMAL HIGH (ref 70–99)
Glucose-Capillary: 134 mg/dL — ABNORMAL HIGH (ref 70–99)

## 2023-05-10 LAB — CULTURE, RESPIRATORY W GRAM STAIN

## 2023-05-10 LAB — CBC
HCT: 24.9 % — ABNORMAL LOW (ref 39.0–52.0)
Hemoglobin: 7.7 g/dL — ABNORMAL LOW (ref 13.0–17.0)
MCH: 29.5 pg (ref 26.0–34.0)
MCHC: 30.9 g/dL (ref 30.0–36.0)
MCV: 95.4 fL (ref 80.0–100.0)
Platelets: 227 10*3/uL (ref 150–400)
RBC: 2.61 MIL/uL — ABNORMAL LOW (ref 4.22–5.81)
RDW: 21.1 % — ABNORMAL HIGH (ref 11.5–15.5)
WBC: 20.9 10*3/uL — ABNORMAL HIGH (ref 4.0–10.5)
nRBC: 0.3 % — ABNORMAL HIGH (ref 0.0–0.2)

## 2023-05-10 LAB — BASIC METABOLIC PANEL WITH GFR
Anion gap: 11 (ref 5–15)
BUN: 23 mg/dL — ABNORMAL HIGH (ref 6–20)
CO2: 25 mmol/L (ref 22–32)
Calcium: 7.9 mg/dL — ABNORMAL LOW (ref 8.9–10.3)
Chloride: 101 mmol/L (ref 98–111)
Creatinine, Ser: 0.5 mg/dL — ABNORMAL LOW (ref 0.61–1.24)
GFR, Estimated: >60 mL/min (ref >60)
Glucose, Bld: 144 mg/dL — ABNORMAL HIGH (ref 70–99)
Potassium: 3.6 mmol/L (ref 3.5–5.1)
Sodium: 137 mmol/L (ref 135–145)

## 2023-05-10 LAB — EXPECTORATED SPUTUM ASSESSMENT W GRAM STAIN, RFLX TO RESP C

## 2023-05-10 LAB — VANCOMYCIN, TROUGH: Vancomycin Tr: 6 ug/mL — ABNORMAL LOW (ref 15–20)

## 2023-05-10 LAB — PHOSPHORUS
Phosphorus: 9.3 mg/dL — ABNORMAL HIGH (ref 2.5–4.6)
Phosphorus: 3 mg/dL (ref 2.5–4.6)

## 2023-05-10 LAB — MAGNESIUM: Magnesium: 1.9 mg/dL (ref 1.7–2.4)

## 2023-05-10 MED ORDER — TRACE MINERALS CU-MN-SE-ZN 300-55-60-3000 MCG/ML IV SOLN
INTRAVENOUS | Status: AC
  Filled 2023-05-10: qty 821.6

## 2023-05-10 MED ORDER — POTASSIUM CHLORIDE 10 MEQ/50ML IV SOLN
10.0000 meq | INTRAVENOUS | Status: AC
  Administered 2023-05-10 – 2023-05-11 (×6): 10 meq via INTRAVENOUS
  Filled 2023-05-10 (×2): qty 50

## 2023-05-10 MED ORDER — MAGNESIUM SULFATE 2 GM/50ML IV SOLN
2.0000 g | Freq: Once | INTRAVENOUS | Status: AC
  Administered 2023-05-10: 2 g via INTRAVENOUS
  Filled 2023-05-10: qty 50

## 2023-05-10 MED ORDER — POTASSIUM CHLORIDE 10 MEQ/50ML IV SOLN
10.0000 meq | INTRAVENOUS | Status: DC
  Filled 2023-05-10: qty 50

## 2023-05-10 MED ORDER — FUROSEMIDE 10 MG/ML IJ SOLN
60.0000 mg | Freq: Two times a day (BID) | INTRAMUSCULAR | Status: DC
  Administered 2023-05-10 – 2023-05-11 (×2): 60 mg via INTRAVENOUS
  Filled 2023-05-10 (×2): qty 6

## 2023-05-10 MED ORDER — MORPHINE SULFATE (PF) 2 MG/ML IV SOLN
1.0000 mg | INTRAVENOUS | Status: DC | PRN
  Administered 2023-05-10 – 2023-05-22 (×48): 2 mg via INTRAVENOUS
  Filled 2023-05-10 (×49): qty 1

## 2023-05-10 MED ORDER — VANCOMYCIN HCL 1250 MG/250ML IV SOLN
1250.0000 mg | INTRAVENOUS | Status: AC
  Administered 2023-05-10: 1250 mg via INTRAVENOUS
  Filled 2023-05-10: qty 250

## 2023-05-10 MED ORDER — POTASSIUM CHLORIDE 10 MEQ/50ML IV SOLN
10.0000 meq | INTRAVENOUS | Status: AC
  Administered 2023-05-10 (×6): 10 meq via INTRAVENOUS
  Filled 2023-05-10: qty 50

## 2023-05-10 NOTE — Progress Notes (Signed)
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  Attempting conservative management and TPN to try to help nutritional status.  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  Denies any frank aspiration, breathing worsened after a BM.  CXR with worsening bilateral infiltrates. After discussion with rapid response and crosscover TRH, PCCM consulted to assist with management.  Patient states breathing is stable from this am, has not been able to sleep due to anxiety over SOB.  2022 echo normal  Pertinent  Medical History  Chronic pancreatitis Prior Whipple Severe protein calorie malnutrition Question of alcohol abuse but multiple previous notes state his use is modest at best.  I do see a level of 134 back in 2021. HTN  Significant Hospital Events: Including procedures, antibiotic start and stop dates in addition to other pertinent events   7/19 admit 7/26 thoracentesis with 800 cc of fluid obtained 7/29 tolerating SBT 7/30 self extubated during SBT. 7/31 reintubated for increased respiratory distress. 8/1 failed SBT, significant agitation  8/2 Failed SBT again this am due to agitation, remains on fentanyl and propofol   Interim History / Subjective:  Stated breathing is getting better slowly every day Coughs intermittently but not frequent Foot pain is better with morphine Still on high flow nasal cannula oxygen  Objective   Blood pressure 120/89, pulse 100, temperature 98.8 F (37.1 C), temperature source Oral, resp. rate (!) 23, height 6\' 3"  (1.905 m), weight 56.5 kg, SpO2 94%.    FiO2 (%):  [60 %-70 %] 67 %    Intake/Output Summary (Last 24 hours) at 05/10/2023 0911 Last data filed at 05/10/2023 0700 Gross per 24 hour  Intake 2806.95 ml  Output 2480 ml  Net 326.95 ml   Filed Weights   05/07/23 0500 05/08/23 0340 05/09/23 0537  Weight: 60.2 kg 58.5 kg 56.5 kg    Examination: General: Chronically ill-appearing cachectic male, lying on the bed HEENT: [place]/AT, eyes anicteric.  moist mucus membranes Neuro: Alert, awake following commands, generalized weak Chest: Reduced air entry at the bases bilaterally, no wheezes or rhonchi Heart: Tachycardic, regular rhythm, no murmurs or gallops Abdomen: Soft, nontender, nondistended, bowel sounds present Skin: -Small wounds noted on the soles of feet, POA        Labs and images were reviewed  Resolved problems:  Septic shock  Severe leukemoid reaction  Hyponatremia  Hypophosphatemia Hypomagnesemia ARDS Acute urinary tract infection with Klebsiella Right pleural effusion s/p thoracentesis Hypernatremia Thrombocytopenia  Assessment & Plan:  Acute hypoxic respiratory failure Recurrent aspiration pneumonia Received Ceftriaxone 7/19-7/25, Flagyl 7/19-7/25, Zosyn 7/25-7/30, Vanc 7/26-7/28, and Micafungin 7/26-7/28 Continue to require high flow nasal cannula oxygen Slowly titrating high flow nasal cannula oxygen Now placed on 25 L and 65% Will watch closely, if FiO2 continue to trend down, will try to transfer him out of ICU to progressive care Completed therapy with meropenem for 7 days Respiratory culture is growing rare gram-positive cocci, will continue with vancomycin to complete 7 days therapy White count remain elevated but stable Continue diuretics with Lasix  Gastrocolic fistula, being manage conservatively for now Will likely need surgical repair once medically optimized  Acute colitis Pancreatic mass  status post Whipple's 2015 Severe protein calorie malnutrition  General Surgery is following intermittently Continue clear  liquid diet Continue TPN Per surgery  Supportive care   Obstipation Aggressive bowel regimen  Hypokalemia Hypophosphatemia Hypomagnesemia Continue aggressive electrolyte replacement  Anemia of critical illness Monitor H&H and transfuse if less than 7  DVT of unknown duration Continue therapeutic Lovenox  Bilateral foot wounds, POA Continue pain meds with IV morphine Continue wound care  Best Practice (right click and "Reselect all SmartList Selections" daily)   Diet/type: TPN, clear liquid diet DVT prophylaxis: Therapeutic Lovenox GI prophylaxis: PPI Lines: Central line for TPN Foley:  N/A Code Status:  full code Last date of multidisciplinary goals of care discussion: 8/8: Patient was updated at bedside   The patient is critically ill due to acute respiratory failure with hypoxia.  Critical care was necessary to treat or prevent imminent or life-threatening deterioration.  Critical care was time spent personally by me on the following activities: development of treatment plan with patient and/or surrogate as well as nursing, discussions with consultants, evaluation of patient's response to treatment, examination of patient, obtaining history from patient or surrogate, ordering and performing treatments and interventions, ordering and review of laboratory studies, ordering and review of radiographic studies, pulse oximetry, re-evaluation of patient's condition and participation in multidisciplinary rounds.   During this encounter critical care time was devoted to patient care services described in this note for 31 minutes.     Cheri Fowler, MD [place] [facility] See Amion for pager If no response to pager, please call [phone] until 7pm After 7pm, Please call E-link [phone]

## 2023-05-10 NOTE — Progress Notes (Signed)
PHARMACY - TOTAL PARENTERAL NUTRITION CONSULT NOTE  Indication: Gastrocolic fistula   Patient Measurements: Height: 6\' 3"  (190.5 cm) Weight: 56.5 kg (124 lb 9 oz) IBW/kg (Calculated) : 84.5 TPN AdjBW (KG): 53.2 Body mass index is 15.57 kg/m. 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  Patient presented on 7/19 with N/V x 4 days along with LLQ pain.  CT showed thickening of the distal transverse colon with complex fistulous connection between the transverse colon to the gastrojejunal anastomosis and adjacent small bowel loops.  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  Pharmacy consulted to manage TPN.  Patient was started on CLD on 7/22 and reported fullness/abdominal pain after eating.  He reports not eating for a week PTA.  He does not weigh himself, but he knows he has lost a lot of weight; his pants size reduced from a 36 to a 28 over a 6 month time frame.  He typically eats 2 meals a day (lunch and dinner) and he eats protein, vegetables and carb; he rarely eats fruits.   Glucose / Insulin: no hx DM - CBG< 180. sSSI 2 units last 24 hrs.   7/31 - significant hypoglycemia on cyclic TPN   8/1 - insulin removed from TPN and BG stablized Electrolytes: Na 137 (none in TPN, taking in oral water), K 3.9 after outside TPN for total with TPN of 235 mEq yesterday), Phos 3, Mag remains stable at 1.9 after 2g yesterday, others WNL Renal: SCr < 1, BUN 27 Hepatic: LFTs / TG WNL, tbili up to 1.4, albumin 2.1, pre-albumin <5 Intake / Output; MIVF: UOP 3 ml/kg/hr with Lasix, net - . Still with anasarca, LBM 8/9 GI Imaging:  7/25 CT: severe fatty liver, thickened appearance of colon GI Surgeries / Procedures: none since TPN initiation  Central access: PICC placed 04/21/23 TPN start date: 04/22/23  Nutritional Goals: Goal concentrated TPN  rate is 65 mL/hr (provides 123g AA and 1930 kCal per day)    8/1 RD Estimated Needs Total Energy Estimated Needs: 1900-2000 kcals via IC Total Protein Estimated Needs: 110-125 g Total Fluid Estimated Needs: >/= 2L  Current Nutrition:  Clear liquids (for pleasure, poor absorption expected) - eating very little TPN  Plan:  Continue concentrated TPN at goal rate 65 ml/hr, meeting 100% of estimated needs Electrolytes in TPN: Na 0 mEq/L, K 80 mEq/L (=125 mEq/day), Ca 4 mEq/L, max Mag 15 mEq/L (= ~3 g/day), max Phos at 30 mmol/L on 8/8 (= 47 mmol/day), max acetate  Add daily multivitamin and trace elements in TPN Continue sensitive SSI every 4 hours.  Continue CBG checks BID. Monitor diuresis plan - replace KCL outside of TPN  Standard TPN labs every Mon/Thurs - labs in AM (may need to add back Na to TPN soon) Plan for GJ-colonic fistula repair when nutrition status improved per Surgery  Magnesium 2g IV x1  Link Snuffer, PharmD, BCPS, BCCCP Clinical Pharmacist Please refer to [facility] for [facility] Pharmacy numbers 05/10/2023, 7:20 AM

## 2023-05-10 NOTE — Progress Notes (Signed)
Pharmacy Antibiotic Note  BIJAN DAUZAT is a 57 y.o. male admitted on 04/17/2023 GI fistula now with purulent sputum possible  pneumonia.  Pharmacy has been consulted for meropenem dosing. Vancomycin was added 8/6 with increased WBC and ongoing O2 requirements.  WBC remain elevated 20, Tm100 Crcl stable approx 80 Vancomycin trough 6 this am < goal - abx to end tomorrow - watch fever curve and sputum cx  Plan: Completed meropenem Continue vancomycin 1250mg  IV q24h  Stop date for 7 days added to orders Monitor fever, WBC, O2 requirements, and sputum characteristics Monitor renal function for appropriate dosing.  Height: 6\' 3"  (190.5 cm) Weight: 56.5 kg (124 lb 9 oz) IBW/kg (Calculated) : 84.5  Temp (24hrs), Avg:98.6 F (37 C), Min:97.9 F (36.6 C), Max:99.9 F (37.7 C)  Recent Labs  Lab 05/06/23 0400 05/06/23 1901 05/07/23 0635 05/07/23 2117 05/08/23 0111 05/08/23 1852 05/09/23 0119 05/09/23 0738 05/10/23 0143 05/10/23 0753  WBC 22.0*  --  21.4*  --  21.1*  --  19.7*  --  20.9*  --   CREATININE 0.60*   < > 0.61 0.59* 0.86 0.51*  --  0.51* 0.50*  --   VANCOTROUGH  --   --   --   --   --   --   --   --   --  6*   < > = values in this interval not displayed.    Estimated Creatinine Clearance: 81.4 mL/min (A) (by C-G formula based on SCr of 0.5 mg/dL (L)).    No Known Allergies  Antimicrobials this admission: Meropenem 8/4 >> [8/10] Zosyn 7/25 >> 7/30 Vancomycin 7/26 >> 7/27, 8/6> [8/12] Micafungin 7/26 >> 7/27 Ceftriaxone 7/19 >> 7/25 Metronidazole 7/19 >> 7/25  Dose adjustments this admission: N/A   Microbiology results: 7/20 Ucx >100k kleb pneumo (S to CTX) 7/25 BCx - NGTD 7/27 sputum GPC/yeast   Leota Sauers Pharm.D. CPP, BCPS Clinical Pharmacist [phone] 05/10/2023 3:25 PM    Please check AMION for all [facility] Pharmacy phone numbers After 10:00 PM, call Main Pharmacy [phone]

## 2023-05-11 ENCOUNTER — Inpatient Hospital Stay (HOSPITAL_COMMUNITY): Payer: BLUE CROSS/BLUE SHIELD

## 2023-05-11 DIAGNOSIS — E43 Unspecified severe protein-calorie malnutrition: Secondary | ICD-10-CM | POA: Diagnosis not present

## 2023-05-11 DIAGNOSIS — Z7189 Other specified counseling: Secondary | ICD-10-CM

## 2023-05-11 DIAGNOSIS — Z515 Encounter for palliative care: Secondary | ICD-10-CM

## 2023-05-11 DIAGNOSIS — J9601 Acute respiratory failure with hypoxia: Secondary | ICD-10-CM | POA: Diagnosis not present

## 2023-05-11 DIAGNOSIS — Z90411 Acquired partial absence of pancreas: Secondary | ICD-10-CM | POA: Diagnosis not present

## 2023-05-11 DIAGNOSIS — E871 Hypo-osmolality and hyponatremia: Secondary | ICD-10-CM | POA: Diagnosis not present

## 2023-05-11 LAB — GLUCOSE, CAPILLARY
Glucose-Capillary: 149 mg/dL — ABNORMAL HIGH (ref 70–99)
Glucose-Capillary: 157 mg/dL — ABNORMAL HIGH (ref 70–99)
Glucose-Capillary: 180 mg/dL — ABNORMAL HIGH (ref 70–99)
Glucose-Capillary: 182 mg/dL — ABNORMAL HIGH (ref 70–99)
Glucose-Capillary: 187 mg/dL — ABNORMAL HIGH (ref 70–99)
Glucose-Capillary: 191 mg/dL — ABNORMAL HIGH (ref 70–99)

## 2023-05-11 LAB — BASIC METABOLIC PANEL
Anion gap: 9 (ref 5–15)
BUN: 24 mg/dL — ABNORMAL HIGH (ref 6–20)
CO2: 28 mmol/L (ref 22–32)
Calcium: 8.3 mg/dL — ABNORMAL LOW (ref 8.9–10.3)
Chloride: 99 mmol/L (ref 98–111)
Creatinine, Ser: 0.5 mg/dL — ABNORMAL LOW (ref 0.61–1.24)
GFR, Estimated: >60 mL/min (ref >60)
Glucose, Bld: 170 mg/dL — ABNORMAL HIGH (ref 70–99)
Potassium: 3.6 mmol/L (ref 3.5–5.1)
Sodium: 136 mmol/L (ref 135–145)

## 2023-05-11 LAB — TRIGLYCERIDES: Triglycerides: 87 mg/dL (ref <150)

## 2023-05-11 MED ORDER — ADENOSINE 6 MG/2ML IV SOLN: 6.0000 mg | Freq: Once | INTRAVENOUS | Status: AC

## 2023-05-11 MED ORDER — MAGNESIUM SULFATE 2 GM/50ML IV SOLN
2.0000 g | Freq: Once | INTRAVENOUS | Status: AC
  Administered 2023-05-11: 2 g via INTRAVENOUS
  Filled 2023-05-11: qty 50

## 2023-05-11 MED ORDER — FUROSEMIDE 10 MG/ML IJ SOLN
40.0000 mg | Freq: Two times a day (BID) | INTRAMUSCULAR | Status: DC
  Administered 2023-05-11 – 2023-05-21 (×20): 40 mg via INTRAVENOUS
  Filled 2023-05-11 (×20): qty 4

## 2023-05-11 MED ORDER — ADENOSINE 6 MG/2ML IV SOLN
INTRAVENOUS | Status: AC
  Administered 2023-05-11: 12 mg via INTRAVENOUS
  Filled 2023-05-11: qty 4

## 2023-05-11 MED ORDER — METOPROLOL TARTRATE 5 MG/5ML IV SOLN: 5.0000 mg | Freq: Once | INTRAVENOUS | Status: AC

## 2023-05-11 MED ORDER — METOPROLOL TARTRATE 5 MG/5ML IV SOLN: 5.0000 mg | Freq: Once | INTRAVENOUS | Status: DC

## 2023-05-11 MED ORDER — ADENOSINE 6 MG/2ML IV SOLN
INTRAVENOUS | Status: AC
  Administered 2023-05-11: 6 mg via INTRAVENOUS
  Filled 2023-05-11: qty 2

## 2023-05-11 MED ORDER — METOPROLOL TARTRATE 5 MG/5ML IV SOLN
INTRAVENOUS | Status: AC
  Administered 2023-05-11: 5 mg via INTRAVENOUS
  Filled 2023-05-11: qty 5

## 2023-05-11 MED ORDER — ADENOSINE 6 MG/2ML IV SOLN: 12.0000 mg | Freq: Once | INTRAVENOUS | Status: AC

## 2023-05-11 MED ORDER — STERILE WATER FOR INJECTION IJ SOLN
INTRAMUSCULAR | Status: AC
  Filled 2023-05-11: qty 40

## 2023-05-11 MED ORDER — TRACE MINERALS CU-MN-SE-ZN 300-55-60-3000 MCG/ML IV SOLN
INTRAVENOUS | Status: AC
  Filled 2023-05-11: qty 817.6

## 2023-05-11 MED ORDER — ADENOSINE 6 MG/2ML IV SOLN
INTRAVENOUS | Status: AC
  Administered 2023-05-11: 6 mg via INTRAVENOUS
  Filled 2023-05-11: qty 4

## 2023-05-11 MED ORDER — POTASSIUM CHLORIDE 10 MEQ/50ML IV SOLN
10.0000 meq | INTRAVENOUS | Status: AC
  Administered 2023-05-11 – 2023-05-12 (×5): 10 meq via INTRAVENOUS
  Filled 2023-05-11 (×5): qty 50

## 2023-05-11 NOTE — Plan of Care (Signed)
  Problem: Education: Goal: Knowledge of General Education information will improve Description: Including pain rating scale, medication(s)/side effects and non-pharmacologic comfort measures Outcome: Progressing   Problem: Health Behavior/Discharge Planning: Goal: Ability to manage health-related needs will improve Outcome: Progressing   Problem: Clinical Measurements: Goal: Diagnostic test results will improve Outcome: Progressing   Problem: Nutrition: Goal: Adequate nutrition will be maintained Outcome: Progressing   Problem: Coping: Goal: Level of anxiety will decrease Outcome: Progressing   Problem: Elimination: Goal: Will not experience complications related to bowel motility Outcome: Progressing Goal: Will not experience complications related to urinary retention Outcome: Progressing   Problem: Pain Managment: Goal: General experience of comfort will improve Outcome: Progressing   Problem: Safety: Goal: Ability to remain free from injury will improve Outcome: Progressing   Problem: Activity: Goal: Ability to tolerate increased activity will improve Outcome: Progressing   Problem: Role Relationship: Goal: Method of communication will improve Outcome: Progressing   Problem: Education: Goal: Ability to describe self-care measures that may prevent or decrease complications (Diabetes Survival Skills Education) will improve Outcome: Progressing Goal: Individualized Educational Video(s) Outcome: Progressing   Problem: Coping: Goal: Ability to adjust to condition or change in health will improve Outcome: Progressing   Problem: Fluid Volume: Goal: Ability to maintain a balanced intake and output will improve Outcome: Progressing   Problem: Health Behavior/Discharge Planning: Goal: Ability to identify and utilize available resources and services will improve Outcome: Progressing Goal: Ability to manage health-related needs will improve Outcome: Progressing    Problem: Metabolic: Goal: Ability to maintain appropriate glucose levels will improve Outcome: Progressing   Problem: Nutritional: Goal: Maintenance of adequate nutrition will improve Outcome: Progressing Goal: Progress toward achieving an optimal weight will improve Outcome: Progressing   Problem: Skin Integrity: Goal: Risk for impaired skin integrity will decrease Outcome: Progressing   Problem: Tissue Perfusion: Goal: Adequacy of tissue perfusion will improve Outcome: Progressing

## 2023-05-11 NOTE — Progress Notes (Signed)
PHARMACY - TOTAL PARENTERAL NUTRITION CONSULT NOTE  Indication: Gastrocolic fistula   Patient Measurements: Height: 6\' 3"  (190.5 cm) Weight: 56.7 kg (125 lb) IBW/kg (Calculated) : 84.5 TPN AdjBW (KG): 53.2 Body mass index is 15.62 kg/m. 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  Patient presented on 7/19 with N/V x 4 days along with LLQ pain.  CT showed thickening of the distal transverse colon with complex fistulous connection between the transverse colon to the gastrojejunal anastomosis and adjacent small bowel loops.  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  Pharmacy consulted to manage TPN.  Patient was started on CLD on 7/22 and reported fullness/abdominal pain after eating.  He reports not eating for a week PTA.  He does not weigh himself, but he knows he has lost a lot of weight; his pants size reduced from a 36 to a 28 over a 6 month time frame.  He typically eats 2 meals a day (lunch and dinner) and he eats protein, vegetables and carb; he rarely eats fruits.   Glucose / Insulin: no hx DM - CBGs adequately controlled  7/31 - significant hypoglycemia on cyclic TPN   8/1 - insulin removed from TPN and BG stabilized SSI restarted 8/10 - use 2 units SSI in the past 24 hrs Electrolytes: Na 135 (none in TPN, taking in oral water), K 4.8 after outside TPN), Lasix increased to 60mg  IV BID on 8/11 PM, labs obtained right after KCL infusion), Mag 1.8 post 2gm IV, others WNL Renal: SCr < 1, BUN 22 Hepatic: AST/ALT up to 60/46, tbili normalized, albumin 2.2, TG WNL, pre-albumin <5 Intake / Output; MIVF: UOP 2.9 ml/kg/hr with Lasix, net -3L. Still with anasarca, LBM 8/9 GI Imaging:  7/25 CT: severe fatty liver, thickened appearance of colon GI Surgeries / Procedures: none since TPN initiation  Central access: PICC placed  04/21/23 TPN start date: 04/22/23  Nutritional Goals: Goal concentrated TPN rate is 65 mL/hr (provides 123g AA and 1914 kCal per day)    8/1 RD Estimated Needs Total Energy Estimated Needs: 1900-2000 kcals via IC Total Protein Estimated Needs: 110-125 g Total Fluid Estimated Needs: >/= 2L  Current Nutrition:  Clear liquids (for pleasure, poor absorption expected) - eating very little TPN  Plan:  Increase TPN volume slightly to add back Na Concentrated TPN at new goal rate of 70 ml/hr, meeting 100% of estimated needs Electrolytes in TPN: add back Na 50 mEq/L, K 80 mEq/L (= 134 mEq/day), Ca 4 mEq/L, max Mag 15 mEq/L (= ~3 g/day), max Phos at 30 mmol/L on 8/8 (= 50 mmol/day), max acetate  Add daily multivitamin and trace elements in TPN Continue sensitive SSI Q4H Monitor diuresis plan - replace KCL outside of TPN  Mag sulfate 2gm IV already given Standard TPN labs every Mon/Thurs Plan for GJ-colonic fistula repair when nutrition status improved per Surgery Transition to cyclic TPN once oxygen needs and volume status improve  Kenney Going D. Laney Potash, PharmD, BCPS, BCCCP 05/11/2023, 8:01 AM

## 2023-05-11 NOTE — Progress Notes (Signed)
At 0200, pt in symptomatic SVT to 180s. Elink contacted. Placed on non-rebreather. Adenosine 6mg , 6mg , 12 mg administered, pt back in ST low 100s. Metoprolol 5mg  administered. Pt resting on non-rebreather, VSS. Morning labs pending.

## 2023-05-11 NOTE — Progress Notes (Signed)
eLink Physician-Brief Progress Note Patient Name: Brent Rogers DOB: 1966/09/20 MRN: [id number]   Date of Service  05/11/2023  HPI/Events of Note  Mag 1.8 On TPN  eICU Interventions  Repleted     Intervention Category Minor Interventions: Electrolytes abnormality - evaluation and management  Brent Rogers 05/11/2023, 3:33 AM

## 2023-05-11 NOTE — Consult Note (Signed)
Consultation Note Date: 05/11/2023   Patient Name: Brent Rogers  DOB: [birth year]  MRN: [id number]  Age / Sex: 57 y.o., male  PCP: Marcine Matar, MD Referring Physician: Josephine Igo, DO  Reason for Consultation: Establishing goals of care  HPI/Patient Profile: 57 y.o. 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  Conservative management and TPN to try to help nutritional status.   Required intubation.  Patient further decompensated from a respiratory standpoint, currently unable to wean from high flow nasal cannula.  Today is day 24 of this hospitalization and unfortunately patient continues to decompensate within the context of full medical support.   Significant hospital events  7/19 admit 7/26 thoracentesis with 800 cc of fluid obtained 7/29 tolerating SBT 7/30 self extubated during SBT. 7/31 reintubated for increased respiratory distress. 8/1 failed SBT, significant agitation  8/2 Failed SBT again this am due to agitation, remains on fentanyl and propofol  8/12 remains on high flow nasal cannula, difficulty weaning, consult to palliative   Patient and family face treatment option decisions, advanced directive decisions and anticipatory care needs.     Clinical Assessment and Goals of Care:  This NP Lorinda Creed reviewed medical records, received report from team, assessed the patient and then meet at the patient's bedside along with his aunt/ Brent Rogers, niece /Brent Rogers and a brother  to discuss diagnosis, prognosis, GOC, EOL wishes disposition and options.  I also spoke to brother/Brent Rogers by telephone today   Concept of Palliative  Care was introduced as specialized medical care for people and their families living with serious illness.  If focuses on providing relief from the symptoms and stress of a serious illness.  The goal is to improve quality of life for both the patient and the family.  Values and goals of care important to patient and family were attempted to be elicited.  Created space and opportunity for patient  and family to explore thoughts and feelings regarding current medical situation.     Both patient and his family are able to verbalize an understanding of the seriousness of his situation, patient is able to communicate his desire and openness to all offered and available medical interventions to prolong life.  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  Family lovingly speak about Mr. 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  Family verbalized the significance of his physical, functional and psychological decline over the past 2 years.  Education offered on the seriousness of patient's current medical situation secondary to multiple co-morbidities.  Patient remains high risk for further decompensation and possible need for reintubation.   A  discussion was had today regarding advanced directives.  Concepts specific to code status, artifical feeding and hydration, continued IV antibiotics and rehospitalization was had.   The difference between a aggressive medical intervention path  and a palliative comfort care path for this patient, in this situation  at this time was had.     MOST form introduced, Hard Choices left for review.     Questions and concerns addressed.  Patient  encouraged to call with questions or concerns.     PMT will continue to support holistically.           No documented H POA or advance care planning documents noted.  Patient is able to tell me today that in the event that he cannot speak for himself he would like for his aunt Brent Rogers to be his  spokesperson.   She agrees. Family very supportive and all work in unison for the best interest of this patient.      SUMMARY OF RECOMMENDATIONS    Code Status/Advance Care Planning: Full code  Educated patient/family to consider DNR/DNI status understanding evidenced based poor outcomes in similar hospitalized patient, as the cause of arrest is likely associated with advanced chronic illness rather than an easily reversible acute cardio-pulmonary event.    Palliative Prophylaxis:  Aspiration, Bowel Regimen, Delirium Protocol, and Frequent Pain Assessment  Additional Recommendations (Limitations, Scope, Preferences): Full Scope Treatment  Psycho-social/Spiritual:  Desire for further Chaplaincy support:no-patient declined spiritual care support Additional Recommendations: Emotional support offered  Prognosis:  Unable to determine-poor long-term prognosis  Discharge Planning: To Be Determined      Primary Diagnoses: Present on Admission:  Pancreatic insufficiency  Iron deficiency anemia  Major depressive disorder, recurrent episode, severe (HCC)  HTN (hypertension)  GERD (gastroesophageal reflux disease)  Gastric ulcer without hemorrhage or perforation  GAD (generalized anxiety disorder)  Chronic pain syndrome  Atherosclerosis of native coronary artery of native heart without angina pectoris  Hyponatremia   I have reviewed the medical record, interviewed the patient and family, and examined the patient. The following aspects are pertinent.  Past Medical History:  Diagnosis Date   Benign tumor of endocrine pancreas    Chronic pancreatitis (HCC)    S/P Whipple   Foot ulcer with fat layer exposed (HCC)    Hypertension    Migraine    "@ least once/month" (11/12/2015)   Pancreatic abnormality    CT  shows mass   Pneumonia 11/12/2015   Scoliosis    Social History   Socioeconomic History   Marital status: Divorced    Spouse name: Not on file   Number of  children: Not on file   Years of education: Not on file   Highest education level: Not on file  Occupational History   Not on file  Tobacco Use   Smoking status: Every Day    Current packs/day: 1.00    Types: Cigarettes   Smokeless tobacco: Former    Types: Snuff   Tobacco comments:    "used snuff 10-15 years in my 20s-30s"  Vaping Use   Vaping status: Never Used  Substance and Sexual Activity   Alcohol use: Yes    Alcohol/week: 2.0 standard drinks of alcohol    Types: 2 Cans of beer per week    Comment: monthly   Drug use: No   Sexual activity: Not Currently    Partners: Male  Other Topics Concern   Not on file  Social History Narrative   Not on file   Social Determinants of Health   Financial Resource Strain: Not on file  Food Insecurity: Not on file  Transportation Needs: Not on file  Physical Activity: Not on file  Stress: Not on  file  Social Connections: Not on file   Family History  Problem Relation Age of Onset   Cancer Mother        unsure   COPD Mother    Hypertension Mother    Hypertension Father    COPD Father    CAD Sister        possible has stent per pt   Scheduled Meds:  Chlorhexidine Gluconate Cloth  6 each Topical Q0600   enoxaparin (LOVENOX) injection  70 mg Subcutaneous Q12H   furosemide  40 mg Intravenous BID   hydrocerin   Topical BID   insulin aspart  0-6 Units Subcutaneous Q4H   metoprolol tartrate  5 mg Intravenous Once   pantoprazole (PROTONIX) IV  40 mg Intravenous QHS   senna-docusate  1 tablet Oral QHS   traZODone  50 mg Oral QHS   Continuous Infusions:  sodium chloride Stopped (05/04/23 1031)   TPN ADULT (ION) 65 mL/hr at 05/11/23 0900   TPN ADULT (ION)     PRN Meds:.Place/Maintain arterial line **AND** sodium chloride, acetaminophen **OR** acetaminophen, albuterol, hydrALAZINE, metoCLOPramide (REGLAN) injection, morphine injection, ondansetron (ZOFRAN) IV, mouth rinse, phenol Medications Prior to Admission:  Prior to  Admission medications   Medication Sig Start Date End Date Taking? Authorizing Provider  acetaminophen (TYLENOL) 500 MG tablet Take 500 mg by mouth every 6 (six) hours as needed for moderate pain.   Yes [provider]  dicyclomine (BENTYL) 20 MG tablet Take 1 tablet (20 mg total) by mouth 2 (two) times daily. Patient not taking: Reported on 04/17/2023 11/29/22   Gailen Shelter, PA  lipase/protease/amylase (CREON) 36000 UNITS CPEP capsule Take 1 capsule (36,000 Units total) by mouth with breakfast, with lunch, and with evening meal. Patient not taking: Reported on 11/29/2022 01/10/22   Virgina Norfolk, DO  metoCLOPramide (REGLAN) 10 MG tablet Take 1 tablet (10 mg total) by mouth every 6 (six) hours as needed for nausea or vomiting. Patient not taking: Reported on 03/12/2022 02/03/22   Molpus, Jonny Ruiz, MD  ondansetron (ZOFRAN) 4 MG tablet Take 1 tablet (4 mg total) by mouth every 8 (eight) hours as needed for nausea or vomiting. Patient not taking: Reported on 11/29/2022 12/03/21   Barrie Dunker B, PA-C  ondansetron (ZOFRAN-ODT) 4 MG disintegrating tablet Take 1 tablet (4 mg total) by mouth every 8 (eight) hours as needed for nausea or vomiting. Patient not taking: Reported on 04/17/2023 03/04/23   Smitty Knudsen, PA-C  pantoprazole (PROTONIX) 40 MG tablet Take 1 tablet (40 mg total) by mouth 2 (two) times daily. Patient not taking: Reported on 11/29/2022 01/10/22 03/13/23  Virgina Norfolk, DO  promethazine (PHENERGAN) 25 MG tablet Take 1 tablet (25 mg total) by mouth every 6 (six) hours as needed for nausea or vomiting. Patient not taking: Reported on 04/17/2023 11/29/22   Gailen Shelter, PA   No Known Allergies Review of Systems  Constitutional:        Patient is tooweek to engage in any meaningful conversation  Respiratory:  Positive for shortness of breath.   Neurological:  Positive for weakness.    Physical Exam Constitutional:      Appearance: He is cachectic. He is ill-appearing.     Comments:  High flow  Cardiovascular:     Rate and Rhythm: Tachycardia present.  Pulmonary:     Effort: Tachypnea present.  Musculoskeletal:     Comments: Generalized weakness and muscle atrophy   Skin:    General: Skin is warm and  dry.     Comments: Noted WC notefrom 05-03-23   Neurological:     Mental Status: He is lethargic.     Vital Signs: BP (!) 130/97   Pulse (!) 105   Temp 97.9 F (36.6 C) (Oral)   Resp (!) 26   Ht 6\' 3"  (1.905 m)   Wt 56.7 kg   SpO2 90%   BMI 15.62 kg/m  Pain Scale: 0-10 POSS *See Group Information*: 1-Acceptable,Awake and alert Pain Score: 8    SpO2: SpO2: 90 % O2 Device:SpO2: 90 % O2 Flow Rate: .O2 Flow Rate (L/min): 35 L/min  IO: Intake/output summary:  Intake/Output Summary (Last 24 hours) at 05/11/2023 0956 Last data filed at 05/11/2023 0900 Gross per 24 hour  Intake 2616.26 ml  Output 3930 ml  Net -1313.74 ml    LBM: Last BM Date : 05/08/23 Baseline Weight: Weight: 53.2 kg Most recent weight: Weight: 56.7 kg     Palliative Assessment/Data:   Discussed with Dr Tonia Brooms  and bedside RN via secure chat   Time:  75 minutes  Signed by: Lorinda Creed, NP   Please contact Palliative Medicine Team phone at [phone] for questions and concerns.  For individual provider: See Loretha Stapler

## 2023-05-11 NOTE — Progress Notes (Signed)
eLink Physician-Brief Progress Note Patient Name: Brent Rogers DOB: Sep 28, 1966 MRN: [id number]   Date of Service  05/11/2023  HPI/Events of Note  SVT to 180s. On HHFNC FIO2 60s SBP 90s  eICU Interventions  Adenosine given. 6mg >6mg >12mg  with return to sinus tachycardia to 110s and SBP 130s  Obtain AM labs including CBC, BMET, Mg     Intervention Category Intermediate Interventions: Arrhythmia - evaluation and management  Koston Hennes [name] 05/11/2023, 2:19 AM

## 2023-05-11 NOTE — Progress Notes (Signed)
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  Attempting conservative management and TPN to try to help nutritional status.  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  Denies any frank aspiration, breathing worsened after a BM.  CXR with worsening bilateral infiltrates. After discussion with rapid response and crosscover TRH, PCCM consulted to assist with management.  Patient states breathing is stable from this am, has not been able to sleep due to anxiety over SOB.  2022 echo normal  Pertinent  Medical History  Chronic pancreatitis Prior Whipple Severe protein calorie malnutrition Question of alcohol abuse but multiple previous notes state his use is modest at best.  I do see a level of 134 back in 2021. HTN  Significant Hospital Events: Including procedures, antibiotic start and stop dates in addition to other pertinent events   7/19 admit 7/26 thoracentesis with 800 cc of fluid obtained 7/29 tolerating SBT 7/30 self extubated during SBT. 7/31 reintubated for increased respiratory distress. 8/1 failed SBT, significant agitation  8/2 Failed SBT again this am due to agitation, remains on fentanyl and propofol  8/12 remains on high flow nasal cannula, difficulty weaning, consult to palliative   Interim History / Subjective:   Episode of SVT last night converted with adenosine 6, 6, 12.  Now receiving beta-blockade.  Still has good urine output.  Unable to wean O2 supplementation O2 sats continue to fall.  Remains on high flow nasal cannula.  Objective   Blood pressure (!) 129/93, pulse (!) 102, temperature  99.1 F (37.3 C), temperature source Oral, resp. rate (!) 27, height 6\' 3"  (1.905 m), weight 56.7 kg, SpO2 91%.    FiO2 (%):  [64 %-70 %] 64 %   Intake/Output Summary (Last 24 hours) at 05/11/2023 0736 Last data filed at 05/11/2023 0600 Gross per 24 hour  Intake 2632.7 ml  Output 3830 ml  Net -1197.3 ml   Filed Weights   05/08/23 0340 05/09/23 0537 05/11/23 0424  Weight: 58.5 kg 56.5 kg 56.7 kg    Examination: General: Chronically ill-appearing cachectic gentleman lying in bed labored breathing HEENT: NCAT, mouth dry Neuro: Alert following commands, Chest: Diminished breath sounds bilaterally no crackles no wheeze Heart: Tachycardic, regular Abdomen: Soft, flat abdomen nondistended Skin: Wounds on the soles of feet per imaging below.        Labs and images were reviewed  Resolved problems:  Septic shock  Severe leukemoid reaction  Hyponatremia  Hypophosphatemia Hypomagnesemia ARDS Acute urinary tract infection with Klebsiella Right pleural effusion s/p thoracentesis Hypernatremia Thrombocytopenia  Assessment & Plan:   Acute hypoxic respiratory failure Recurrent aspiration pneumonia Received Ceftriaxone 7/19-7/25, Flagyl 7/19-7/25, Zosyn 7/25-7/30, Vanc 7/26-7/28, and Micafungin 7/26-7/28 Plan: Patient remains on high flow nasal cannula with difficulty weaning.  Discussed with RT. Repeat chest x-ray since his last film was on the ninth. Holding any additional antibiotics at this point. Lasix to maintain euvolemia. His hypoxemia is multifactorial. I feel like we need to have a more clear understanding of patient's goals and wishes before we make a decision to investigate even further.  With his recurrent aspiration issues I suspect that the scarring that has developed in his  lungs over time as well as the recurrent injury is going to lead to constant need for oxygen supplementation and at this rate be very difficult for him to wean from heated high flow nasal  cannula.  He seems to be very tired and distraught by this.  Does not want to stay in the hospital.  He lives alone not married no children. Consult placed to palliative care.  Gastrocolic fistula, being manage conservatively for now Will likely need surgical repair once medically optimized  Acute colitis Pancreatic mass status post Whipple's 2015 Severe protein calorie malnutrition  General Surgery is following intermittently Continue clear liquid diet Continue TPN Per surgery   Obstipation Continue bowel regimen  Hypokalemia Hypophosphatemia Hypomagnesemia Continue electrolyte replacement as needed  Anemia of critical illness Monitor H&H and transfuse if less than 7  DVT of unknown duration Continue therapeutic Lovenox  Bilateral foot wounds, POA Continue as needed morphine Wound care consultation.   Best Practice (right click and "Reselect all SmartList Selections" daily)   Diet/type: TPN, clear liquid diet DVT prophylaxis: Therapeutic Lovenox GI prophylaxis: PPI Lines: Central line for TPN Foley:  N/A Code Status:  full code Last date of multidisciplinary goals of care discussion: 8/8: Patient was updated at bedside   This patient is critically ill with multiple organ system failure; which, requires frequent high complexity decision making, assessment, support, evaluation, and titration of therapies. This was completed through the application of advanced monitoring technologies and extensive interpretation of multiple databases. During this encounter critical care time was devoted to patient care services described in this note for 32 minutes.  Josephine Igo, DO [facility] 05/11/2023 7:36 AM

## 2023-05-11 NOTE — Progress Notes (Signed)
Occupational Therapy Treatment Patient Details Name: Brent Rogers MRN: [id number] DOB: 1966/05/03 Today's Date: 05/11/2023   History of present illness Pt is a 57 y.o. 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 Conservative management with TPN with potential for eventual surgery. Rapid response called, on BiPAP and s/p thoracentesis 7/26. Intubated 7/27 and self-extubated 7/31, reintubated 7/31-8/3. PMH includes HTN, CAD, GERD, PUD, s/p cholecystectomy, partial pancreatectomy, anxiety, depression, chronic pain, scoliosis, ETOH abuse.   OT comments  Pt not progressing towards goals this session, remains fatigued and with decr motivation. Pt declines EOB/OOB mobility this date, agreeable to bed level mobility. Pt able to complete BUE therex, provided with red theraband and squeeze ball. Pt able to self-suction during session. Educated pt on importance of turning/placing pillow under hip for pressure relief, but pt declines. Pt presenting with impairments listed below,will follow acutely. Patient will benefit from continued inpatient follow up therapy, <3 hours/day to maximize safety/ind with ADLs/functional mobility.       If plan is discharge home, recommend the following:  Two people to help with walking and/or transfers;A lot of help with bathing/dressing/bathroom;Assistance with cooking/housework;Direct supervision/assist for medications management;Direct supervision/assist for financial management;Assist for transportation;Help with stairs or ramp for entrance   Equipment Recommendations  Other (comment) (defer)    Recommendations for Other Services PT consult    Precautions / Restrictions Precautions Precautions: Fall;Other (comment) Precaution Comments: 35L HHFNC 60%FiO2 Restrictions Weight Bearing Restrictions: No       Mobility Bed Mobility               General bed mobility comments: pt  declines    Transfers                   General transfer comment: pt declines     Balance                                           ADL either performed or assessed with clinical judgement   ADL Overall ADL's : Needs assistance/impaired Eating/Feeding: Supervision/ safety;Bed level Eating/Feeding Details (indicate cue type and reason): able to self-suction                                        Extremity/Trunk Assessment Upper Extremity Assessment Upper Extremity Assessment: Generalized weakness   Lower Extremity Assessment Lower Extremity Assessment: Defer to PT evaluation        Vision   Vision Assessment?: No apparent visual deficits   Perception Perception Perception: Not tested   Praxis Praxis Praxis: Not tested    Cognition Arousal: Alert Behavior During Therapy: Flat affect, Anxious Overall Cognitive Status: Within Functional Limits for tasks assessed                                 General Comments: decr motivation        Exercises Exercises: Other exercises Other Exercises Other Exercises: bed level forward flexion with red theraband x10 Other Exercises: bed level shoulder shrugs x10 Other Exercises: bed level squeeze ball x10    Shoulder Instructions       General Comments      Pertinent Vitals/ Pain  Pain Assessment Pain Assessment: Faces Pain Location: stomach, back Pain Descriptors / Indicators: Discomfort, Sore Pain Intervention(s): Limited activity within patient's tolerance  Home Living                                          Prior Functioning/Environment              Frequency  Min 1X/week        Progress Toward Goals  OT Goals(current goals can now be found in the care plan section)  Progress towards OT goals: Not progressing toward goals - comment (fatigue)  Acute Rehab OT Goals Patient Stated Goal: none stated OT Goal  Formulation: With patient Time For Goal Achievement: 05/18/23 Potential to Achieve Goals: Good ADL Goals Pt Will Perform Upper Body Dressing: with contact guard assist;sitting Pt Will Perform Lower Body Dressing: sitting/lateral leans;sit to/from stand;with mod assist Pt Will Transfer to Toilet: with min assist;with +2 assist;squat pivot transfer;stand pivot transfer;bedside commode Additional ADL Goal #1: pt will perform bed mobility min guard A in prep for ADLs  Plan      Co-evaluation                 AM-PAC OT "6 Clicks" Daily Activity     Outcome Measure   Help from another person eating meals?: A Little Help from another person taking care of personal grooming?: A Lot Help from another person toileting, which includes using toliet, bedpan, or urinal?: A Lot Help from another person bathing (including washing, rinsing, drying)?: A Lot Help from another person to put on and taking off regular upper body clothing?: A Lot Help from another person to put on and taking off regular lower body clothing?: Total 6 Click Score: 12    End of Session Equipment Utilized During Treatment: Oxygen  OT Visit Diagnosis: Unsteadiness on feet (R26.81);Other abnormalities of gait and mobility (R26.89);Muscle weakness (generalized) (M62.81)   Activity Tolerance Patient limited by fatigue;Patient limited by lethargy   Patient Left in bed;with call bell/phone within reach;with nursing/sitter in room   Nurse Communication Mobility status        Time: 3244-0102 OT Time Calculation (min): 10 min  Charges: OT General Charges $OT Visit: 1 Visit OT Treatments $Therapeutic Exercise: 8-22 mins  Carver Fila, OTD, OTR/L SecureChat Preferred Acute Rehab [phone]   Carver Fila Koonce 05/11/2023, 3:20 PM

## 2023-05-11 NOTE — Progress Notes (Signed)
Pharmacy Electrolyte Replacement (per TPN protocol)  Recent Labs:  Recent Labs    05/11/23 0221 05/11/23 1810  K 4.8 3.6  MG 1.8  --   PHOS 2.8  --   CREATININE 0.60* 0.50*    Low Critical Values (K </= 2.5, Phos </= 1, Mg </= 1) Present: None  Pt now back on Lasix 40mg  IV BID (got 60mg  IV yesterday pm and this am)  Plan: KCl IV x 5 F/u labs in a.m.  Christoper Fabian, PharmD, BCPS Please see amion for complete clinical pharmacist phone list 05/11/2023 7:45 PM

## 2023-05-11 NOTE — Progress Notes (Signed)
Physical Therapy Treatment Patient Details Name: Brent Rogers MRN: [id number] DOB: [birth year] Today's Date: 05/11/2023   History of Present Illness Pt is a 57 y.o. 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 Conservative management with TPN with potential for eventual surgery. Rapid response called, on BiPAP and s/p thoracentesis 7/26. Intubated 7/27 and self-extubated 7/31, reintubated 7/31-8/3. PMH includes HTN, CAD, GERD, PUD, s/p cholecystectomy, partial pancreatectomy, anxiety, depression, chronic pain, scoliosis, ETOH abuse.    PT Comments  Patient extremely limited due to fatigue and decreased activity tolerance.  Remains on HHFNC @35L  60% FiO2.  He was able to participate in limited LE therex in bed today with encouragement.  Feel considering oxygen needs may need to consider LTACH.  PT will continue to follow in the acute setting.    If plan is discharge home, recommend the following: A lot of help with walking and/or transfers;A lot of help with bathing/dressing/bathroom;Assist for transportation;Help with stairs or ramp for entrance   Can travel by private vehicle     No  Equipment Recommendations  Other (comment)    Recommendations for Other Services       Precautions / Restrictions Precautions Precautions: Fall Precaution Comments: 35L HHFNC 60%FiO2 Restrictions Weight Bearing Restrictions: No     Mobility  Bed Mobility               General bed mobility comments: pt declines    Transfers                        Ambulation/Gait                   Stairs             Wheelchair Mobility     Tilt Bed    Modified Rankin (Stroke Patients Only)       Balance                                            Cognition Arousal: Alert Behavior During Therapy: Flat affect, Anxious Overall Cognitive Status: No family/caregiver present  to determine baseline cognitive functioning                                 General Comments: following commands, initially refusing mobility, but finally agreed to in bed therex; says he has been told by 10 different people how important it is to move but he is still refusing        Exercises General Exercises - Lower Extremity Ankle Circles/Pumps: AROM, 5 reps, Both, Supine Short Arc Quad: AROM, 5 reps, Both, Supine, Strengthening Heel Slides: AROM, 5 reps, Both, Supine, Strengthening    General Comments        Pertinent Vitals/Pain Pain Assessment Pain Assessment: Faces Faces Pain Scale: Hurts little more Pain Location: stomach, back Pain Descriptors / Indicators: Discomfort, Sore Pain Intervention(s): Monitored during session, Limited activity within patient's tolerance    Home Living                          Prior Function            PT Goals (current goals can now be found in the care plan section) Progress  towards PT goals: Not progressing toward goals - comment    Frequency    Min 1X/week      PT Plan      Co-evaluation              AM-PAC PT "6 Clicks" Mobility   Outcome Measure  Help needed turning from your back to your side while in a flat bed without using bedrails?: Total Help needed moving from lying on your back to sitting on the side of a flat bed without using bedrails?: Total Help needed moving to and from a bed to a chair (including a wheelchair)?: Total Help needed standing up from a chair using your arms (e.g., wheelchair or bedside chair)?: Total Help needed to walk in hospital room?: Total Help needed climbing 3-5 steps with a railing? : Total 6 Click Score: 6    End of Session Equipment Utilized During Treatment: Oxygen Activity Tolerance: Patient limited by fatigue;Treatment limited secondary to medical complications (Comment) Patient left: in bed;with call bell/phone within reach;Other (comment)  (handoff to OT)   PT Visit Diagnosis: Muscle weakness (generalized) (M62.81);Other abnormalities of gait and mobility (R26.89)     Time: 9562-1308 PT Time Calculation (min) (ACUTE ONLY): 13 min  Charges:    $Therapeutic Exercise: 8-22 mins PT General Charges $$ ACUTE PT VISIT: 1 Visit                     Sheran Lawless, PT Acute Rehabilitation Services Office:[phone] 05/11/2023    Brent Rogers 05/11/2023, 5:35 PM

## 2023-05-12 DIAGNOSIS — E871 Hypo-osmolality and hyponatremia: Secondary | ICD-10-CM | POA: Diagnosis not present

## 2023-05-12 LAB — GLUCOSE, CAPILLARY
Glucose-Capillary: 155 mg/dL — ABNORMAL HIGH (ref 70–99)
Glucose-Capillary: 177 mg/dL — ABNORMAL HIGH (ref 70–99)
Glucose-Capillary: 172 mg/dL — ABNORMAL HIGH (ref 70–99)
Glucose-Capillary: 176 mg/dL — ABNORMAL HIGH (ref 70–99)
Glucose-Capillary: 149 mg/dL — ABNORMAL HIGH (ref 70–99)

## 2023-05-12 MED ORDER — MAGNESIUM SULFATE 2 GM/50ML IV SOLN
2.0000 g | Freq: Once | INTRAVENOUS | Status: AC
  Administered 2023-05-12: 2 g via INTRAVENOUS
  Filled 2023-05-12: qty 50

## 2023-05-12 MED ORDER — TRACE MINERALS CU-MN-SE-ZN 300-55-60-3000 MCG/ML IV SOLN
INTRAVENOUS | Status: AC
  Filled 2023-05-12: qty 817.6

## 2023-05-12 MED ORDER — POTASSIUM CHLORIDE 10 MEQ/50ML IV SOLN
10.0000 meq | INTRAVENOUS | Status: AC
  Administered 2023-05-12 – 2023-05-13 (×6): 10 meq via INTRAVENOUS
  Filled 2023-05-12 (×6): qty 50

## 2023-05-12 NOTE — Progress Notes (Signed)
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  Attempting conservative management and TPN to try to help nutritional status.  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  Denies any frank aspiration, breathing worsened after a BM.  CXR with worsening bilateral infiltrates. After discussion with rapid response and crosscover TRH, PCCM consulted to assist with management.  Patient states breathing is stable from this am, has not been able to sleep due to anxiety over SOB.  2022 echo normal  Pertinent  Medical History  Chronic pancreatitis Prior Whipple Severe protein calorie malnutrition Question of alcohol abuse but multiple previous notes state his use is modest at best.  I do see a level of 134 back in 2021. HTN  Significant Hospital Events: Including procedures, antibiotic start and stop dates in addition to other pertinent events   7/19 admit 7/26 thoracentesis with 800 cc of fluid obtained 7/29 tolerating SBT 7/30 self extubated during SBT. 7/31 reintubated for increased respiratory distress. 8/1 failed SBT, significant agitation  8/2 Failed SBT again this am due to agitation, remains on fentanyl and propofol  8/12 remains on high flow nasal cannula, difficulty weaning, consult to palliative 8/13 still trying to wean... however patient refused PT yesterady    Interim History / Subjective:   Still attempting to wean from high flow nasal cannula.  Currently on 35 L and 60%.  Unfortunately he refused physical therapy yesterday.  I met with him and discussed the importance of him remaining active and attempting to  stay as strong as possible.  Objective   Blood pressure 126/89, pulse 100, temperature 98.5 F (36.9 C), temperature source Oral, resp. rate 20, height 6\' 3"  (1.905 m), weight 55.3 kg, SpO2 97%.    FiO2 (%):  [60 %] 60 %   Intake/Output Summary (Last 24 hours) at 05/12/2023 0745 Last data filed at 05/12/2023 0600 Gross per 24 hour  Intake 2674.33 ml  Output 3320 ml  Net -645.67 ml   Filed Weights   05/09/23 0537 05/11/23 0424 05/12/23 0600  Weight: 56.5 kg 56.7 kg 55.3 kg    Examination: General: Chronically ill-appearing cachectic gentleman lying in bed tachypneic HEENT: NCAT, dry mouth Neuro: Alert, following commands Chest: Diminished breath sounds bilaterally, few crackles in the base Heart: Tachycardic, regular Abdomen: Soft flat abdomen nondistended Skin: Wounds on the soles of the feet per images below present on admission        Labs and images were reviewed  Resolved problems:  Septic shock  Severe leukemoid reaction  Hyponatremia  Hypophosphatemia Hypomagnesemia ARDS Acute urinary tract infection with Klebsiella Right pleural effusion s/p thoracentesis Hypernatremia Thrombocytopenia  Assessment & Plan:   Acute hypoxic respiratory failure Recurrent aspiration pneumonia Received Ceftriaxone 7/19-7/25, Flagyl 7/19-7/25, Zosyn 7/25-7/30, Vanc 7/26-7/28, and Micafungin 7/26-7/28 Plan: Patient remains on high flow nasal cannula difficulty weaning.  He is very weak, low muscle mass Lasix to maintain euvolemia he has completed his course of antibiotics no additional biotics at this time. His hypoxemia I think is multifactorial due to hypoventilation as well as recurrent aspiration and scarring of the lungs.  Chest x-ray shows bilateral parenchymal lung disease.  I think that  his O2 requirements are too high at the moment for consideration of transport for CT imaging and I am not sure that CT imaging would change management at this time anyways. He lives  alone, not married no children We appreciate consult by palliative care.  Will have ongoing goals of care discussions.  Gastrocolic fistula, being manage conservatively for now Will likely need surgical repair once medically optimized  Acute colitis Pancreatic mass status post Whipple's 2015 Severe protein calorie malnutrition  General Surgery is following intermittently Continue clear liquid diet Continue TPN Per surgery   Obstipation Continue bowel regimen  Hypokalemia Hypophosphatemia Hypomagnesemia Continue replete electrolytes   Anemia of critical illness Monitor H&H and transfuse if less than 7  DVT of unknown duration Continue therapeutic lovenox   Bilateral foot wounds, POA Continue as needed morphine Wound care consultation.   Best Practice (right click and "Reselect all SmartList Selections" daily)   Diet/type: TPN, clear liquid diet DVT prophylaxis: Therapeutic Lovenox GI prophylaxis: PPI Lines: Central line for TPN Foley:  N/A Code Status:  full code Last date of multidisciplinary goals of care discussion: 8/8: Patient was updated at bedside  This patient is critically ill with multiple organ system failure; which, requires frequent high complexity decision making, assessment, support, evaluation, and titration of therapies. This was completed through the application of advanced monitoring technologies and extensive interpretation of multiple databases. During this encounter critical care time was devoted to patient care services described in this note for 32 minutes.  Josephine Igo, DO [facility] 05/12/2023 7:45 AM

## 2023-05-12 NOTE — Progress Notes (Signed)
PHARMACY - TOTAL PARENTERAL NUTRITION CONSULT NOTE  Indication: Gastrocolic fistula   Patient Measurements: Height: 6\' 3"  (190.5 cm) Weight: 55.3 kg (121 lb 14.6 oz) IBW/kg (Calculated) : 84.5 TPN AdjBW (KG): 53.2 Body mass index is 15.24 kg/m. 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  Patient presented on 7/19 with N/V x 4 days along with LLQ pain.  CT showed thickening of the distal transverse colon with complex fistulous connection between the transverse colon to the gastrojejunal anastomosis and adjacent small bowel loops.  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  Pharmacy consulted to manage TPN.  Patient was started on CLD on 7/22 and reported fullness/abdominal pain after eating.  He reports not eating for a week PTA.  He does not weigh himself, but he knows he has lost a lot of weight; his pants size reduced from a 36 to a 28 over a 6 month time frame.  He typically eats 2 meals a day (lunch and dinner) and he eats protein, vegetables and carb; he rarely eats fruits.   Glucose / Insulin: no hx DM - CBGs adequately controlled  7/31 - significant hypoglycemia on cyclic TPN   8/1 - insulin removed from TPN and BG stabilized SSI restarted 8/10 - use 2 units SSI in the past 24 hrs Electrolytes: Na 134 (added back to TPN 8/12), K 4.3 (after 50 mEq outside TPN yesterday), *Lasix reduced to 40 IV BID on 8/12, Mag 1.8 (max in TPN; 2g yesterday), others WNL Renal: SCr < 1, BUN 21 Hepatic: AST/ALT up to 60/46, tbili normalized, albumin 2.2, TG WNL, pre-albumin <5 Intake / Output; MIVF: UOP 2.9 ml/kg/hr with Lasix, reduced today - heading more to euvolemic state; still with high O2 requirements. LBM 8/9 GI Imaging:  7/25 CT: severe fatty liver, thickened appearance of colon GI Surgeries / Procedures: none since TPN initiation  Central  access: PICC placed 04/21/23 TPN start date: 04/22/23  Nutritional Goals: Goal concentrated TPN rate is 70 mL/hr (provides 123g AA and 1914 kCal per day)    8/1 RD Estimated Needs Total Energy Estimated Needs: 1900-2000 kcals via IC Total Protein Estimated Needs: 110-125 g Total Fluid Estimated Needs: >/= 2L  Current Nutrition:  Clear liquids (for pleasure, poor absorption expected) - eating very little TPN  Plan:  Increased TPN volume slightly to add back Na on 8/12. Continue concentrated TPN at new goal rate of 70 ml/hr, meeting 100% of estimated needs Electrolytes in TPN: Na 50 mEq/L, K 80 mEq/L (= 134 mEq/day), Ca 4 mEq/L, max Mag 15 mEq/L (= ~3 g/day), max Phos at 30 mmol/L on 8/8 (= 50 mmol/day), max acetate  Add daily multivitamin and trace elements in TPN Continue sensitive SSI Q4H Monitor diuresis plan - replace KCL outside of TPN ( 6 runs ordered this AM) Primary team replacing Mag today.  Standard TPN labs every Mon/Thurs Plan for GJ-colonic fistula repair when nutrition status improved per Surgery Transition to cyclic TPN once oxygen needs and volume status improve *Will follow-up repeat labs in AM with decrease in Lasix today.  Link Snuffer, PharmD, BCPS, BCCCP Please refer to [facility] for [facility] Pharmacy numbers 05/12/2023, 8:02 AM

## 2023-05-13 ENCOUNTER — Other Ambulatory Visit: Payer: Self-pay

## 2023-05-13 DIAGNOSIS — E871 Hypo-osmolality and hyponatremia: Secondary | ICD-10-CM | POA: Diagnosis not present

## 2023-05-13 DIAGNOSIS — Z515 Encounter for palliative care: Secondary | ICD-10-CM | POA: Diagnosis not present

## 2023-05-13 DIAGNOSIS — J9601 Acute respiratory failure with hypoxia: Secondary | ICD-10-CM | POA: Diagnosis not present

## 2023-05-13 DIAGNOSIS — Z90411 Acquired partial absence of pancreas: Secondary | ICD-10-CM | POA: Diagnosis not present

## 2023-05-13 DIAGNOSIS — R531 Weakness: Secondary | ICD-10-CM

## 2023-05-13 DIAGNOSIS — Z7189 Other specified counseling: Secondary | ICD-10-CM | POA: Diagnosis not present

## 2023-05-13 LAB — GLUCOSE, CAPILLARY
Glucose-Capillary: 146 mg/dL — ABNORMAL HIGH (ref 70–99)
Glucose-Capillary: 136 mg/dL — ABNORMAL HIGH (ref 70–99)
Glucose-Capillary: 136 mg/dL — ABNORMAL HIGH (ref 70–99)
Glucose-Capillary: 170 mg/dL — ABNORMAL HIGH (ref 70–99)
Glucose-Capillary: 143 mg/dL — ABNORMAL HIGH (ref 70–99)
Glucose-Capillary: 137 mg/dL — ABNORMAL HIGH (ref 70–99)
Glucose-Capillary: 155 mg/dL — ABNORMAL HIGH (ref 70–99)

## 2023-05-13 LAB — HEPARIN ANTI-XA: Heparin LMW: 0.39 IU/mL

## 2023-05-13 MED ORDER — TRACE MINERALS CU-MN-SE-ZN 300-55-60-3000 MCG/ML IV SOLN
INTRAVENOUS | Status: AC
  Filled 2023-05-13: qty 817.6

## 2023-05-13 MED ORDER — ENOXAPARIN SODIUM 80 MG/0.8ML IJ SOSY
80.0000 mg | PREFILLED_SYRINGE | Freq: Two times a day (BID) | INTRAMUSCULAR | Status: DC
  Administered 2023-05-13 – 2023-05-22 (×18): 80 mg via SUBCUTANEOUS
  Filled 2023-05-13 (×20): qty 0.8

## 2023-05-13 MED ORDER — POTASSIUM CHLORIDE 10 MEQ/50ML IV SOLN
10.0000 meq | INTRAVENOUS | Status: AC
  Administered 2023-05-13 – 2023-05-14 (×6): 10 meq via INTRAVENOUS
  Filled 2023-05-13 (×6): qty 50

## 2023-05-13 MED ORDER — ENOXAPARIN SODIUM 80 MG/0.8ML IJ SOSY
70.0000 mg | PREFILLED_SYRINGE | Freq: Two times a day (BID) | INTRAMUSCULAR | Status: DC
  Administered 2023-05-13: 70 mg via SUBCUTANEOUS
  Filled 2023-05-13 (×2): qty 0.7

## 2023-05-13 MED ORDER — MAGNESIUM SULFATE 2 GM/50ML IV SOLN
2.0000 g | Freq: Once | INTRAVENOUS | Status: AC
  Administered 2023-05-13: 2 g via INTRAVENOUS
  Filled 2023-05-13: qty 50

## 2023-05-13 NOTE — Progress Notes (Signed)
Nutrition Follow-up  DOCUMENTATION CODES:   Severe malnutrition in context of chronic illness, Underweight  INTERVENTION:   TPN order per Pharmacy -RD adjusted calorie and protein needs, will communicate with Pharmacy Team  CL as tolerated for comfort only  NUTRITION DIAGNOSIS:   Severe Malnutrition related to chronic illness as evidenced by severe fat depletion, severe muscle depletion.  Being addressed via TPN   GOAL:   Patient will meet greater than or equal to 90% of their needs  Progressing  MONITOR:   Vent status, Labs, Weight trends, Skin, I & O's, Other (Comment) (TPN)  REASON FOR ASSESSMENT:   Consult New TPN/TNA  ASSESSMENT:   57 y.o. 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 Patient found to have  fistulous connections to GJ junction and small bowel  Pt remains on HFNC but requiring NRB as well today  Pt appears very weak, minimally engages on visit today.   Pt still receiving CL for comfort but taking in very little  Palliative care following, pt remains Full Code  TPN at 70 ml/hr providing 1914 kcals and 123 g of protein  Edema improved on exam  Weight down to 54.7 kg; lowest weight this admission. Close to weight on 03/04/2023.   Labs: CBGs 136-177 Meds: lasix, ss novolog, reglan prn, KCl  Diet Order:   Diet Order             Diet clear liquid Room service appropriate? Yes; Fluid consistency: Thin; Fluid restriction: 1200 mL Fluid  Diet effective now                   EDUCATION NEEDS:   Not appropriate for education at this time  Skin:  Skin Assessment: Skin Integrity Issues: Skin Integrity Issues:: Other (Comment) Other: bilateral foot wounds on soles  Last BM:  8/9 type 6 large  Height:   Ht Readings from Last 1  Encounters:  04/18/23 6\' 3"  (1.905 m)    Weight:   Wt Readings from Last 1 Encounters:  05/13/23 54.7 kg     BMI:  Body mass index is 15.07 kg/m.  Estimated Nutritional Needs:   Kcal:  2200-2400 kcals  Protein:  120-135 g  Fluid:  2L  [name] MS, RDN, LDN, CNSC Registered Dietitian 3 Clinical Nutrition RD Pager and On-Call Pager Number Located in [place]

## 2023-05-13 NOTE — Plan of Care (Signed)
  Problem: Education: Goal: Knowledge of General Education information will improve Description: Including pain rating scale, medication(s)/side effects and non-pharmacologic comfort measures Outcome: Not Progressing   Problem: Health Behavior/Discharge Planning: Goal: Ability to manage health-related needs will improve Outcome: Not Progressing   Problem: Clinical Measurements: Goal: Ability to maintain clinical measurements within normal limits will improve Outcome: Not Progressing Goal: Will remain free from infection Outcome: Not Progressing Goal: Diagnostic test results will improve Outcome: Not Progressing Goal: Respiratory complications will improve Outcome: Not Progressing Goal: Cardiovascular complication will be avoided Outcome: Not Progressing   Problem: Activity: Goal: Risk for activity intolerance will decrease Outcome: Not Progressing   Problem: Nutrition: Goal: Adequate nutrition will be maintained Outcome: Not Progressing   Problem: Coping: Goal: Level of anxiety will decrease Outcome: Not Progressing   Problem: Elimination: Goal: Will not experience complications related to bowel motility Outcome: Not Progressing Goal: Will not experience complications related to urinary retention Outcome: Not Progressing   Problem: Pain Managment: Goal: General experience of comfort will improve Outcome: Not Progressing   Problem: Safety: Goal: Ability to remain free from injury will improve Outcome: Not Progressing   Problem: Skin Integrity: Goal: Risk for impaired skin integrity will decrease Outcome: Not Progressing   Problem: Activity: Goal: Ability to tolerate increased activity will improve Outcome: Not Progressing   Problem: Respiratory: Goal: Ability to maintain a clear airway and adequate ventilation will improve Outcome: Not Progressing   Problem: Role Relationship: Goal: Method of communication will improve Outcome: Not Progressing   Problem:  Education: Goal: Ability to describe self-care measures that may prevent or decrease complications (Diabetes Survival Skills Education) will improve Outcome: Not Progressing Goal: Individualized Educational Video(s) Outcome: Not Progressing   Problem: Coping: Goal: Ability to adjust to condition or change in health will improve Outcome: Not Progressing   Problem: Fluid Volume: Goal: Ability to maintain a balanced intake and output will improve Outcome: Not Progressing   Problem: Health Behavior/Discharge Planning: Goal: Ability to identify and utilize available resources and services will improve Outcome: Not Progressing Goal: Ability to manage health-related needs will improve Outcome: Not Progressing   Problem: Metabolic: Goal: Ability to maintain appropriate glucose levels will improve Outcome: Not Progressing   Problem: Nutritional: Goal: Maintenance of adequate nutrition will improve Outcome: Not Progressing Goal: Progress toward achieving an optimal weight will improve Outcome: Not Progressing   Problem: Skin Integrity: Goal: Risk for impaired skin integrity will decrease Outcome: Not Progressing   Problem: Tissue Perfusion: Goal: Adequacy of tissue perfusion will improve Outcome: Not Progressing   

## 2023-05-13 NOTE — Progress Notes (Signed)
PCCM:  I called and updated Christa his niece.   Josephine Igo, DO [facility] 05/13/2023 2:09 PM

## 2023-05-13 NOTE — Progress Notes (Signed)
PHARMACY - TOTAL PARENTERAL NUTRITION CONSULT NOTE  Indication: Gastrocolic fistula   Patient Measurements: Height: 6\' 3"  (190.5 cm) Weight: 54.7 kg (120 lb 9.5 oz) IBW/kg (Calculated) : 84.5 TPN AdjBW (KG): 53.2 Body mass index is 15.07 kg/m. 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  Patient presented on 7/19 with N/V x 4 days along with LLQ pain.  CT showed thickening of the distal transverse colon with complex fistulous connection between the transverse colon to the gastrojejunal anastomosis and adjacent small bowel loops.  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  Pharmacy consulted to manage TPN.  Patient was started on CLD on 7/22 and reported fullness/abdominal pain after eating.  He reports not eating for a week PTA.  He does not weigh himself, but he knows he has lost a lot of weight; his pants size reduced from a 36 to a 28 over a 6 month time frame.  He typically eats 2 meals a day (lunch and dinner) and he eats protein, vegetables and carb; he rarely eats fruits.   Glucose / Insulin: no hx DM - CBGs adequately controlled, 4 units SSI 7/31 - significant hypoglycemia on cyclic TPN   8/1 - insulin removed from TPN and BG stabilized SSI restarted 8/10 - use 2 units SSI in the past 24 hrs Electrolytes: Na 136 (added back to TPN 8/12), K 4.1 (after 50 mEq outside TPN yesterday), *Lasix reduced to 40 IV BID on 8/12, Mag unchanged at 1.8 (max in TPN; 2g yesterday), CoCa 10, others WNL Renal: SCr < 1, BUN 23 Hepatic: from 8/12: AST/ALT up to 60/46, tbili normalized, albumin 2.2, TG WNL, pre-albumin <5 Intake / Output; MIVF: UOP 2.2 ml/kg/hr with Lasix, reduced today - heading more to euvolemic state (net I/O 108); still with high O2 requirements. LBM 8/9 GI Imaging:  7/25 CT: severe fatty liver, thickened appearance of colon GI  Surgeries / Procedures: none since TPN initiation  Central access: PICC placed 04/21/23 TPN start date: 04/22/23  Nutritional Goals: Goal concentrated TPN rate is 70 mL/hr (provides 123g AA and 1914 kCal per day)    8/1 RD Estimated Needs Total Energy Estimated Needs: 1900-2000 kcals via IC Total Protein Estimated Needs: 110-125 g Total Fluid Estimated Needs: >/= 2L  Current Nutrition:  Clear liquids (for pleasure, poor absorption expected) - eating very little **Patient refusing care TPN  Plan:  Continue concentrated TPN at goal rate of 70 ml/hr, meeting 100% of estimated needs Electrolytes in TPN: Na 50 mEq/L, K 80 mEq/L (= 134 mEq/day), Ca 4 mEq/L, max Mag 15 mEq/L (= ~3 g/day), max Phos at 30 mmol/L on 8/8 (= 50 mmol/day), max acetate  Add daily multivitamin and trace elements in TPN Continue sensitive SSI Q4H Monitor diuresis plan (reduced 8/12) - replacing KCL and further Mag outside of TPN (primary team replaced 8/14).  Standard TPN labs every Mon/Thurs Plan for GJ-colonic fistula repair when nutrition status improved per Surgery Transition to cyclic TPN once oxygen needs and volume status improve - currently requiring too much oxygen support to tolerate   Link Snuffer, PharmD, BCPS, BCCCP Please refer to [facility] for [facility] Pharmacy numbers 05/13/2023, 7:07 AM

## 2023-05-13 NOTE — Progress Notes (Signed)
Patient ID: Brent Rogers, male   DOB: Aug 14, 1966, 57 y.o.   MRN: [id number]    Progress Note from the Palliative Medicine Team at [facility]   Patient Name: Brent Rogers        Date: 05/13/2023 DOB: [birth year]  Age: 57 y.o. MRN#: [id number] Attending Physician: Josephine Igo, DO Primary Care Physician: Marcine Matar, MD Admit Date: 04/17/2023    Extensive chart review has been completed prior to meeting with patient/family  including labs, vital signs, imaging, progress/consult notes, orders, medications and available advance directive documents.   57 y.o. 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 Conservative management and TPN to try to help nutritional status.    Significant hospital events   7/19 admit 7/26 thoracentesis with 800 cc of fluid obtained 7/29 tolerating SBT 7/30 self extubated during SBT. 7/31 reintubated for increased respiratory distress. 8/1 failed SBT, significant agitation  8/2 Failed SBT again this am due to agitation, remains on fentanyl and propofol  8/14 remains on high flow nasal cannula, difficulty weaning     Patient and family face treatment option decisions, advanced directive decisions and anticipatory care needs.   This NP assessed patient at the bedside as a follow up for palliative medicine needs and emotional support.  Patient is weak, minimally conversant.    Continued education regarding the seriousness of patient's current medical situation, concept of adult failure to thrive and the limitations of medical interventions to prolong quality of life when the body does fail to thrive.  Education and conversation regarding advance care planning, specific to CODE STATUS         -Educated patient to consider  DNR/DNI status understanding evidenced based poor outcomes in similar hospitalized patient, as the cause of arrest is likely associated with advanced chronic illness rather than an easily reversible acute cardio-pulmonary event.  Education offered on the difference between a full medical support path and a palliative comfort path for this patient at this time.     Patient communicates that he continues to be open to all offered and available medical interventions to prolong life.  Spoke by phone with Christa/niece one of his main support persons.  Get with her discussed above topics.  She is requesting a phone call from the attending for medical update.  I notified Dr. I card   Education offered today regarding  the importance of continued conversation with family and their  medical providers regarding overall plan of care and treatment options,  ensuring decisions are within the context of the patients values and GOCs.  PMT will continue to support holistically  Questions and concerns addressed   Discussed with bedside RN   Time:  50 minutes  Detailed review of medical records ( labs, imaging, vital signs), medically appropriate exam ( MS, skin, resp)   discussed with treatment team, counseling and education to patient, family, staff, documenting clinical information, medication management, coordination of care    Lorinda Creed NP  Palliative Medicine Team Team Phone # [phone] Pager [phone]

## 2023-05-13 NOTE — Progress Notes (Signed)
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  Attempting conservative management and TPN to try to help nutritional status.  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  Denies any frank aspiration, breathing worsened after a BM.  CXR with worsening bilateral infiltrates. After discussion with rapid response and crosscover TRH, PCCM consulted to assist with management.  Patient states breathing is stable from this am, has not been able to sleep due to anxiety over SOB. 2022 echo normal  Pertinent  Medical History  Chronic pancreatitis Prior Whipple Severe protein calorie malnutrition Question of alcohol abuse but multiple previous notes state his use is modest at best.  I do see a level of 134 back in 2021. HTN  Significant Hospital Events: Including procedures, antibiotic start and stop dates in addition to other pertinent events   7/19 admit 7/26 thoracentesis with 800 cc of fluid obtained 7/29 tolerating SBT 7/30 self extubated during SBT. 7/31 reintubated for increased respiratory distress. 8/1 failed SBT, significant agitation  8/2 Failed SBT again this am due to agitation, remains on fentanyl and propofol  8/12 remains on high flow nasal cannula, difficulty weaning, consult to palliative 8/13 still trying to wean... however patient refused PT yesterady   8/14 minimal to no improvement, refusing care, refusing turns, refusing PT   Interim History / Subjective:   Remains on HHFNC, minimal improvement   Objective   Blood pressure (!) 140/99, pulse 96, temperature 98.2 F (36.8 C), temperature source Oral,  resp. rate (!) 21, height 6\' 3"  (1.905 m), weight 54.7 kg, SpO2 92%.    FiO2 (%):  [40 %] 40 %   Intake/Output Summary (Last 24 hours) at 05/13/2023 0759 Last data filed at 05/13/2023 0700 Gross per 24 hour  Intake 2628.25 ml  Output 2920 ml  Net -291.75 ml   Filed Weights   05/11/23 0424 05/12/23 0600 05/13/23 0500  Weight: 56.7 kg 55.3 kg 54.7 kg    Examination: General: Chronically ill-appearing, cachectic gentleman low muscle mass HEENT: NCAT, dry mouth Neuro: Alert following commands no focal deficit Chest: Diminished breath sounds bilaterally, crackles in the bases Heart: Tachycardic, regular Abdomen: Soft, nontender nondistended Skin: Bilateral sores on feet as images below present on admission        Labs and images were reviewed  Resolved problems:  Septic shock  Severe leukemoid reaction  Hyponatremia  Hypophosphatemia Hypomagnesemia ARDS Acute urinary tract infection with Klebsiella Right pleural effusion s/p thoracentesis Hypernatremia Thrombocytopenia  Assessment & Plan:   Acute hypoxic respiratory failure Recurrent aspiration pneumonia Received Ceftriaxone 7/19-7/25, Flagyl 7/19-7/25, Zosyn 7/25-7/30, Vanc 7/26-7/28, and Micafungin 7/26-7/28 Plan: Patient still remains on heated high flow nasal cannula Has significant muscle wasting low muscle mass He unfortunately is refusing care at this point, no turning, refusing PT He did not get out of bed yesterday. His hypoxemia I think is multifactorial one of his main issues is hypoventilation and recurrent aspiration. He believes he is going to recover from this and get better so he can have "surgery again" I do not believe that there is any surgeries planned for this gentleman. He lives alone, not married no children We appreciate palliative  care's input and are ongoing discussions. Patient remains full code.  Gastrocolic fistula, being manage conservatively for now Will likely need surgical  repair once medically optimized  Acute colitis Pancreatic mass status post Whipple's 2015 Severe protein calorie malnutrition  General Surgery is following intermittently Continue clear liquid diet Continue TPN continued   Obstipation Continue bowel regimen   Hypokalemia Hypophosphatemia Hypomagnesemia Continue replete electrolytes   Anemia of critical illness Monitor H&H and transfuse if less than 7  DVT of unknown duration Continue therapeutic lovenox   Bilateral foot wounds, POA Continue as needed morphine Wound care consultation.   Best Practice (right click and "Reselect all SmartList Selections" daily)   Diet/type: TPN, clear liquid diet DVT prophylaxis: Therapeutic Lovenox GI prophylaxis: PPI Lines: Central line for TPN Foley:  N/A Code Status:  full code Last date of multidisciplinary goals of care discussion: 8/8: Patient was updated at bedside   This patient is critically ill with multiple organ system failure; which, requires frequent high complexity decision making, assessment, support, evaluation, and titration of therapies. This was completed through the application of advanced monitoring technologies and extensive interpretation of multiple databases. During this encounter critical care time was devoted to patient care services described in this note for 32 minutes.  Josephine Igo, DO [place] [facility] 05/13/2023 8:00 AM

## 2023-05-13 NOTE — Progress Notes (Signed)
Physical Therapy Treatment Patient Details Name: Brent Rogers MRN: [id number] DOB: 10/18/1965 Today's Date: 05/13/2023   History of Present Illness Pt is a 57 y.o. 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 Conservative management with TPN with potential for eventual surgery. Rapid response called, on BiPAP and s/p thoracentesis 7/26. Intubated 7/27 and self-extubated 7/31, reintubated 7/31-8/3. PMH includes HTN, CAD, GERD, PUD, s/p cholecystectomy, partial pancreatectomy, anxiety, depression, chronic pain, scoliosis, ETOH abuse.    PT Comments  Patient with limited tolerance though able to transfer to recliner with mod A (+2 for lines) to allow for bed linen change.  Patient SOB and reaching to reposition NRB though SpO2 throughout maintained in 90's.  Patient repositioned in bed end of session.  Will continue to benefit from skilled PT.  Recommend LTACH for follow up therapy at d/c.     If plan is discharge home, recommend the following: A lot of help with walking and/or transfers;A lot of help with bathing/dressing/bathroom;Assist for transportation;Help with stairs or ramp for entrance;Assistance with cooking/housework;Direct supervision/assist for medications management   Can travel by private vehicle     No  Equipment Recommendations  Other (comment) (TBA)    Recommendations for Other Services       Precautions / Restrictions Precautions Precautions: Fall Precaution Comments: 35L HHFNC 60%FiO2     Mobility  Bed Mobility Overal bed mobility: Needs Assistance Bed Mobility: Supine to Sit, Sit to Supine     Supine to sit: Mod assist, HOB elevated Sit to supine: Min assist, +2 for safety/equipment   General bed mobility comments: lifting help for trunk, increased time and effort to scoot to EOB with help for lines.  Assist for lines and repositioning for back to bed    Transfers Overall  transfer level: Needs assistance Equipment used: 1 person hand held assist Transfers: Bed to chair/wheelchair/BSC   Stand pivot transfers: Mod assist, +2 safety/equipment   Squat pivot transfers: Mod assist, +2 safety/equipment     General transfer comment: up to recliner with HHA with +2 for lines; back to bed via squat pivot and RN helping for lines    Ambulation/Gait               General Gait Details: unable   Stairs             Wheelchair Mobility     Tilt Bed    Modified Rankin (Stroke Patients Only)       Balance Overall balance assessment: Needs assistance   Sitting balance-Leahy Scale: Fair Sitting balance - Comments: though limited endurance sitting EOB, leans down on elbow waiting for PT to place safety belt   Standing balance support: Single extremity supported Standing balance-Leahy Scale: Poor Standing balance comment: limited upright mobility due to SOB, HHA needed for stand pivot to recliner                            Cognition Arousal: Alert Behavior During Therapy: Flat affect, Anxious Overall Cognitive Status: No family/caregiver present to determine baseline cognitive functioning                                 General Comments: focused on breathing so slow to respond to commands and limited verbal responses        Exercises      General Comments General  comments (skin integrity, edema, etc.): on HHFNC 35L 60% FiO2 and wearing NRB mask as RN reports earlier pt with desaturation with anxiety, but refused bipap, so placed mask      Pertinent Vitals/Pain Pain Assessment Pain Assessment: Faces Faces Pain Scale: Hurts a little bit Pain Location: generalized with mobility Pain Descriptors / Indicators: Grimacing Pain Intervention(s): Monitored during session, Repositioned    Home Living                          Prior Function            PT Goals (current goals can now be found in the  care plan section) Progress towards PT goals: Progressing toward goals    Frequency    Min 1X/week      PT Plan      Co-evaluation              AM-PAC PT "6 Clicks" Mobility   Outcome Measure  Help needed turning from your back to your side while in a flat bed without using bedrails?: A Lot Help needed moving from lying on your back to sitting on the side of a flat bed without using bedrails?: A Lot Help needed moving to and from a bed to a chair (including a wheelchair)?: Total Help needed standing up from a chair using your arms (e.g., wheelchair or bedside chair)?: Total Help needed to walk in hospital room?: Total Help needed climbing 3-5 steps with a railing? : Total 6 Click Score: 8    End of Session Equipment Utilized During Treatment: Gait belt;Oxygen Activity Tolerance: Patient limited by fatigue Patient left: in bed;with call bell/phone within reach   PT Visit Diagnosis: Muscle weakness (generalized) (M62.81);Other abnormalities of gait and mobility (R26.89)     Time: 5188-4166 PT Time Calculation (min) (ACUTE ONLY): 35 min  Charges:    $Therapeutic Activity: 23-37 mins PT General Charges $$ ACUTE PT VISIT: 1 Visit                     Sheran Lawless, PT Acute Rehabilitation Services [phone] 05/13/2023    Brent Rogers 05/13/2023, 3:59 PM

## 2023-05-14 DIAGNOSIS — E871 Hypo-osmolality and hyponatremia: Secondary | ICD-10-CM | POA: Diagnosis not present

## 2023-05-14 LAB — COMPREHENSIVE METABOLIC PANEL
ALT: 54 U/L — ABNORMAL HIGH (ref 0–44)
AST: 58 U/L — ABNORMAL HIGH (ref 15–41)
Albumin: 2 g/dL — ABNORMAL LOW (ref 3.5–5.0)
Alkaline Phosphatase: 110 U/L (ref 38–126)
Anion gap: 9 (ref 5–15)
BUN: 23 mg/dL — ABNORMAL HIGH (ref 6–20)
CO2: 31 mmol/L (ref 22–32)
Calcium: 8.4 mg/dL — ABNORMAL LOW (ref 8.9–10.3)
Chloride: 92 mmol/L — ABNORMAL LOW (ref 98–111)
Creatinine, Ser: 0.66 mg/dL (ref 0.61–1.24)
GFR, Estimated: >60 mL/min (ref >60)
Glucose, Bld: 142 mg/dL — ABNORMAL HIGH (ref 70–99)
Potassium: 4.2 mmol/L (ref 3.5–5.1)
Sodium: 132 mmol/L — ABNORMAL LOW (ref 135–145)
Total Bilirubin: 0.6 mg/dL (ref 0.3–1.2)
Total Protein: 5.9 g/dL — ABNORMAL LOW (ref 6.5–8.1)

## 2023-05-14 LAB — MAGNESIUM: Magnesium: 1.8 mg/dL (ref 1.7–2.4)

## 2023-05-14 LAB — HEPARIN ANTI-XA: Heparin LMW: 0.31 IU/mL

## 2023-05-14 LAB — CBC
HCT: 25.5 % — ABNORMAL LOW (ref 39.0–52.0)
Hemoglobin: 8 g/dL — ABNORMAL LOW (ref 13.0–17.0)
MCH: 30 pg (ref 26.0–34.0)
MCHC: 31.4 g/dL (ref 30.0–36.0)
MCV: 95.5 fL (ref 80.0–100.0)
Platelets: 412 10*3/uL — ABNORMAL HIGH (ref 150–400)
RBC: 2.67 MIL/uL — ABNORMAL LOW (ref 4.22–5.81)
RDW: 17.5 % — ABNORMAL HIGH (ref 11.5–15.5)
WBC: 21.4 10*3/uL — ABNORMAL HIGH (ref 4.0–10.5)
nRBC: 0.1 % (ref 0.0–0.2)

## 2023-05-14 LAB — GLUCOSE, CAPILLARY
Glucose-Capillary: 148 mg/dL — ABNORMAL HIGH (ref 70–99)
Glucose-Capillary: 142 mg/dL — ABNORMAL HIGH (ref 70–99)
Glucose-Capillary: 145 mg/dL — ABNORMAL HIGH (ref 70–99)
Glucose-Capillary: 147 mg/dL — ABNORMAL HIGH (ref 70–99)
Glucose-Capillary: 207 mg/dL — ABNORMAL HIGH (ref 70–99)
Glucose-Capillary: 127 mg/dL — ABNORMAL HIGH (ref 70–99)

## 2023-05-14 LAB — PHOSPHORUS: Phosphorus: 3.5 mg/dL (ref 2.5–4.6)

## 2023-05-14 MED ORDER — POTASSIUM CHLORIDE 10 MEQ/50ML IV SOLN
10.0000 meq | INTRAVENOUS | Status: AC
  Administered 2023-05-14 (×4): 10 meq via INTRAVENOUS
  Filled 2023-05-14 (×4): qty 50

## 2023-05-14 MED ORDER — TRACE MINERALS CU-MN-SE-ZN 300-55-60-3000 MCG/ML IV SOLN
INTRAVENOUS | Status: AC
  Filled 2023-05-14: qty 876

## 2023-05-14 MED ORDER — MAGNESIUM SULFATE 2 GM/50ML IV SOLN
2.0000 g | Freq: Once | INTRAVENOUS | Status: AC
  Administered 2023-05-14: 2 g via INTRAVENOUS
  Filled 2023-05-14: qty 50

## 2023-05-14 NOTE — Progress Notes (Signed)
   05/14/23 2012  Oxygen Therapy/Pulse Ox  O2 Device Non-rebreather Mask  O2 Therapy Oxygen  O2 Flow Rate (L/min) 15 L/min  SpO2 100 %   Pt refuses to wear HHFNC or BiPAP at this time.

## 2023-05-14 NOTE — Progress Notes (Signed)
PHARMACY - TOTAL PARENTERAL NUTRITION CONSULT NOTE  Indication: Gastrocolic fistula   Patient Measurements: Height: 6\' 3"  (190.5 cm) Weight: 54.5 kg (120 lb 2.4 oz) IBW/kg (Calculated) : 84.5 TPN AdjBW (KG): 53.2 Body mass index is 15.02 kg/m. 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  Patient presented on 7/19 with N/V x 4 days along with LLQ pain.  CT showed thickening of the distal transverse colon with complex fistulous connection between the transverse colon to the gastrojejunal anastomosis and adjacent small bowel loops.  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  Pharmacy consulted to manage TPN.  Patient was started on CLD on 7/22 and reported fullness/abdominal pain after eating.  He reports not eating for a week PTA.  He does not weigh himself, but he knows he has lost a lot of weight; his pants size reduced from a 36 to a 28 over a 6 month time frame.  He typically eats 2 meals a day (lunch and dinner) and he eats protein, vegetables and carb; he rarely eats fruits.   Glucose / Insulin: no hx DM - CBGs adequately controlled, 1 units SSI 7/31 - significant hypoglycemia on cyclic TPN   8/1 - insulin removed from TPN and BG stabilized Electrolytes: Na 132 (added back to TPN 8/12), K 4.2 (after 60 mEq outside TPN yesterday), *Lasix reduced to 40 IV BID on 8/12, Mag unchanged at 1.8 (max in TPN; 2g yesterday), CoCa 10, Cl low, CO2 wnl; others WNL Renal: SCr < 1, BUN 23 Hepatic: from 8/15: AST/ALT trending back down, tbili normalized, albumin 2, TG WNL; 8/5 pre-albumin <5 Intake / Output; MIVF: UOP 2.4 ml/kg/hr with reduced Lasix  (net I/O -400); still with high O2 requirements. LBM 8/9 GI Imaging:  7/25 CT: severe fatty liver, thickened appearance of colon GI Surgeries / Procedures: none since TPN initiation  Central access: PICC  placed 04/21/23 TPN start date: 04/22/23  Nutritional Goals: Goal concentrated TPN rate is 75 mL/hr (provides 131g AA and 2232 kCal per day)    8/1 RD Estimated Needs Total Energy Estimated Needs: 2200-2400 kcals Total Protein Estimated Needs: 120-135 g Total Fluid Estimated Needs: 2L  Current Nutrition:  Clear liquids (for pleasure, poor absorption expected) - eating very little **Patient refusing care TPN  Plan:  Adjust concentrated TPN to new goal rate of 75 ml/hr, meeting 100% of estimated needs Electrolytes in TPN: increase Na 70 mEq/L, K 80 mEq/L (= 144 mEq/day), Ca 4 mEq/L, max Mag 15 mEq/L (= ~3 g/day), max Phos at 30 mmol/L on 8/8 (= 50 mmol/day), max chloride  Add daily multivitamin and trace elements in TPN Continue sensitive SSI Q4H and watch requirements with increase in dextrose. Monitor diuresis plan (reduced 8/12) - replacing KCL and further Mag outside of TPN- 2g Mag x1 today Standard TPN labs every Mon/Thurs Plan for GJ-colonic fistula repair when nutrition status improved per Surgery Transition to cyclic TPN once oxygen needs and volume status improve - currently requiring too much oxygen support to tolerate cycling  Link Snuffer, PharmD, BCPS, BCCCP Please refer to [facility] for [facility] Pharmacy numbers 05/14/2023, 7:11 AM

## 2023-05-14 NOTE — Progress Notes (Signed)
Occupational Therapy Treatment Patient Details Name: Brent Rogers MRN: [id number] DOB: September 07, 1966 Today's Date: 05/14/2023   History of present illness Pt is a 57 y.o. 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 Conservative management with TPN with potential for eventual surgery. Rapid response called, on BiPAP and s/p thoracentesis 7/26. Intubated 7/27 and self-extubated 7/31, reintubated 7/31-8/3. PMH includes HTN, CAD, GERD, PUD, s/p cholecystectomy, partial pancreatectomy, anxiety, depression, chronic pain, scoliosis, ETOH abuse.   OT comments  Patient continues to self limit treatments.  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  Patient did agree to bed mobility and HEP described below.  OT will continue efforts in the acute setting and Patient will benefit from continued inpatient follow up therapy, <3 hours/day       If plan is discharge home, recommend the following:  Two people to help with walking and/or transfers;A lot of help with bathing/dressing/bathroom;Assistance with cooking/housework;Direct supervision/assist for medications management;Direct supervision/assist for financial management;Assist for transportation;Help with stairs or ramp for entrance   Equipment Recommendations       Recommendations for Other Services      Precautions / Restrictions Precautions Precautions: Fall Restrictions Weight Bearing Restrictions: No       Mobility Bed Mobility Overal bed mobility: Needs Assistance Bed Mobility: Rolling Rolling: Min assist              Transfers                         Balance                                           ADL either performed or assessed with clinical judgement   ADL       Grooming: Set up;Sitting;Wash/dry face                                       Extremity/Trunk Assessment Upper Extremity Assessment Upper Extremity Assessment: Generalized weakness   Lower Extremity Assessment Lower Extremity Assessment: Defer to PT evaluation   Cervical / Trunk Assessment Cervical / Trunk Assessment: Normal                      Cognition Arousal: Alert Behavior During Therapy: Flat affect Overall Cognitive Status: Within Functional Limits for tasks assessed                                          Exercises General Exercises - Upper Extremity Shoulder Flexion: AROM, Both, 10 reps, Theraband Shoulder Extension: AROM, Both, 10 reps, Theraband Elbow Flexion: AROM, Supine, 10 reps, Theraband Elbow Extension: AROM, Supine, 10 reps, Theraband General Exercises - Lower Extremity Ankle Circles/Pumps: AROM, 5 reps, Both, Supine Heel Slides: AROM, 5 reps, Both, Supine, Strengthening    Shoulder Instructions       General Comments      Pertinent Vitals/ Pain       Pain Assessment Pain Assessment: Faces Faces Pain Scale: Hurts little more Pain Location: generalized with mobility Pain Descriptors / Indicators: Grimacing Pain Intervention(s):  Monitored during session                                                          Frequency  Min 1X/week        Progress Toward Goals  OT Goals(current goals can now be found in the care plan section)  Progress towards OT goals: Progressing toward goals  Acute Rehab OT Goals OT Goal Formulation: With patient Time For Goal Achievement: 05/18/23 Potential to Achieve Goals: Good  Plan      Co-evaluation                 AM-PAC OT "6 Clicks" Daily Activity     Outcome Measure   Help from another person eating meals?: A Little Help from another person taking care of personal grooming?: A Little Help from another person toileting, which includes using toliet, bedpan, or urinal?: A Lot Help from another person bathing  (including washing, rinsing, drying)?: A Lot Help from another person to put on and taking off regular upper body clothing?: A Lot Help from another person to put on and taking off regular lower body clothing?: Total 6 Click Score: 13    End of Session Equipment Utilized During Treatment: Oxygen  OT Visit Diagnosis: Unsteadiness on feet (R26.81);Other abnormalities of gait and mobility (R26.89);Muscle weakness (generalized) (M62.81)   Activity Tolerance Patient limited by fatigue   Patient Left in bed;with call bell/phone within reach;with nursing/sitter in room   Nurse Communication Mobility status        Time: 1345-1359 OT Time Calculation (min): 14 min  Charges: OT General Charges $OT Visit: 1 Visit OT Treatments $Therapeutic Exercise: 8-22 mins  05/14/2023  RP, OTR/L  Acute Rehabilitation Services  Office:  [phone]   Suzanna Obey 05/14/2023, 2:04 PM

## 2023-05-14 NOTE — Plan of Care (Signed)
  Problem: Education: Goal: Knowledge of General Education information will improve Description: Including pain rating scale, medication(s)/side effects and non-pharmacologic comfort measures Outcome: Progressing   Problem: Health Behavior/Discharge Planning: Goal: Ability to manage health-related needs will improve Outcome: Progressing   Problem: Clinical Measurements: Goal: Respiratory complications will improve Outcome: Progressing   Problem: Activity: Goal: Risk for activity intolerance will decrease Outcome: Progressing   Problem: Nutrition: Goal: Adequate nutrition will be maintained Outcome: Progressing   Problem: Coping: Goal: Level of anxiety will decrease Outcome: Progressing   Problem: Pain Managment: Goal: General experience of comfort will improve Outcome: Progressing   Problem: Activity: Goal: Ability to tolerate increased activity will improve Outcome: Progressing

## 2023-05-14 NOTE — Progress Notes (Signed)
NAME:  Brent Rogers, MRN:  [id number], DOB:  Apr 12, 1966, LOS: 26 ADMISSION DATE:  04/17/2023, CONSULTATION DATE:  04/24/23 REFERRING MD:  Rapid and TRH crosscover, CHIEF COMPLAINT:  SOB   History of Present Illness:  57 year old man w/ hx of alcohol use, PUD, chronic pancreatitis, prior pancreatectomy and cholecystectomy p/w abd pain found to have gastrocolonic fistula.  Attempting conservative management and TPN to try to help nutritional status.  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  Denies any frank aspiration, breathing worsened after a BM.  CXR with worsening bilateral infiltrates. After discussion with rapid response and crosscover TRH, PCCM consulted to assist with management.  Patient states breathing is stable from this am, has not been able to sleep due to anxiety over SOB. 2022 echo normal  Pertinent  Medical History  Chronic pancreatitis Prior Whipple Severe protein calorie malnutrition Question of alcohol abuse but multiple previous notes state his use is modest at best.  I do see a level of 134 back in 2021. HTN  Significant Hospital Events: Including procedures, antibiotic start and stop dates in addition to other pertinent events   7/19 admit 7/26 thoracentesis with 800 cc of fluid obtained 7/29 tolerating SBT 7/30 self extubated during SBT. 7/31 reintubated for increased respiratory distress. 8/1 failed SBT, significant agitation  8/2 Failed SBT again this am due to agitation, remains on fentanyl and propofol  8/12 remains on high flow nasal cannula, difficulty weaning, consult to palliative 8/13 still trying to wean... however patient refused PT yesterady   8/14 minimal to no improvement, refusing care, refusing turns, refusing PT   Interim History / Subjective:   Ongoing palliative care discussions.  Patient remains on high flow nasal cannula.  Long discussion with family yesterday.  Appreciate palliative care  input.  Objective   Blood pressure (!) 117/94, pulse 97, temperature 98.2 F (36.8 C), temperature source Oral, resp. rate (!) 24, height 6\' 3"  (1.905 m), weight 54.5 kg, SpO2 96%.    FiO2 (%):  [40 %-60 %] 40 %   Intake/Output Summary (Last 24 hours) at 05/14/2023 0954 Last data filed at 05/14/2023 0800 Gross per 24 hour  Intake 3007.61 ml  Output 3015 ml  Net -7.39 ml   Filed Weights   05/12/23 0600 05/13/23 0500 05/14/23 0500  Weight: 55.3 kg 54.7 kg 54.5 kg    Examination: General: Chronically ill-appearing cachectic gentleman, BMI 15 HEENT: NCAT, dry mouth, nasal cannula high flow in place Neuro: Alert following commands no focal deficit Chest: Diminished breath sounds bilaterally no crackles no wheeze Heart: Tachycardic, regular Abdomen: Soft, nontender nondistended Skin: Sores on the bottom of the feet as images below        Labs and images were reviewed  Resolved problems:  Septic shock  Severe leukemoid reaction  Hyponatremia  Hypophosphatemia Hypomagnesemia ARDS Acute urinary tract infection with Klebsiella Right pleural effusion s/p thoracentesis Hypernatremia Thrombocytopenia  Assessment & Plan:   Acute hypoxic respiratory failure Recurrent aspiration pneumonia Received Ceftriaxone 7/19-7/25, Flagyl 7/19-7/25, Zosyn 7/25-7/30, Vanc 7/26-7/28, and Micafungin 7/26-7/28 Plan: Patient remains on heated high flow nasal cannula with high liter flow. He is severely malnourished cachectic and weak.  Unfortunately he continued to refuse turning and refusing PT. He again did not get out of the bed yesterday. His hypoxemia is multifactorial and I think it is related to his hypoventilation and recurrent aspiration and scarring. I do not think that he has a physical state to consider having  any kind of repeat surgery.  We talked about this yesterday and he is starting to understand a little more about the severity of his illness. I believe that he is combating  denial. We appreciate palliative care input and ongoing goals of care discussions. Remains full code at this time  Gastrocolic fistula, being manage conservatively for now Will likely need surgical repair once medically optimized  Acute colitis Pancreatic mass status post Whipple's 2015 Severe protein calorie malnutrition  General Surgery is following intermittently Continue clear liquid diet Continue TPN  Obstipation Continue bowel regimen   Hypokalemia Hypophosphatemia Hypomagnesemia Continue replete electrolytes   Anemia of critical illness Monitor H&H and transfuse if less than 7  DVT of unknown duration Continue therapeutic lovenox   Bilateral foot wounds, POA Continue as needed morphine Wound care consultation.   Best Practice (right click and "Reselect all SmartList Selections" daily)   Diet/type: TPN, clear liquid diet DVT prophylaxis: Therapeutic Lovenox GI prophylaxis: PPI Lines: Central line for TPN Foley:  N/A Code Status:  full code Last date of multidisciplinary goals of care discussion: 8/8: Patient was updated at bedside   This patient is critically ill with multiple organ system failure; which, requires frequent high complexity decision making, assessment, support, evaluation, and titration of therapies. This was completed through the application of advanced monitoring technologies and extensive interpretation of multiple databases. During this encounter critical care time was devoted to patient care services described in this note for 31 minutes.  Josephine Igo, DO [facility] 05/14/2023 9:54 AM

## 2023-05-15 DIAGNOSIS — E871 Hypo-osmolality and hyponatremia: Secondary | ICD-10-CM | POA: Diagnosis not present

## 2023-05-15 LAB — CBC
HCT: 26 % — ABNORMAL LOW (ref 39.0–52.0)
Hemoglobin: 8 g/dL — ABNORMAL LOW (ref 13.0–17.0)
MCH: 29.5 pg (ref 26.0–34.0)
MCHC: 30.8 g/dL (ref 30.0–36.0)
MCV: 95.9 fL (ref 80.0–100.0)
Platelets: 484 10*3/uL — ABNORMAL HIGH (ref 150–400)
RBC: 2.71 MIL/uL — ABNORMAL LOW (ref 4.22–5.81)
RDW: 17.3 % — ABNORMAL HIGH (ref 11.5–15.5)
WBC: 25.8 10*3/uL — ABNORMAL HIGH (ref 4.0–10.5)
nRBC: 0.1 % (ref 0.0–0.2)

## 2023-05-15 LAB — BASIC METABOLIC PANEL
Anion gap: 7 (ref 5–15)
BUN: 25 mg/dL — ABNORMAL HIGH (ref 6–20)
CO2: 33 mmol/L — ABNORMAL HIGH (ref 22–32)
Calcium: 8.5 mg/dL — ABNORMAL LOW (ref 8.9–10.3)
Chloride: 93 mmol/L — ABNORMAL LOW (ref 98–111)
Creatinine, Ser: 0.53 mg/dL — ABNORMAL LOW (ref 0.61–1.24)
GFR, Estimated: >60 mL/min (ref >60)
Glucose, Bld: 151 mg/dL — ABNORMAL HIGH (ref 70–99)
Potassium: 4.1 mmol/L (ref 3.5–5.1)
Sodium: 133 mmol/L — ABNORMAL LOW (ref 135–145)

## 2023-05-15 LAB — GLUCOSE, CAPILLARY
Glucose-Capillary: 139 mg/dL — ABNORMAL HIGH (ref 70–99)
Glucose-Capillary: 147 mg/dL — ABNORMAL HIGH (ref 70–99)
Glucose-Capillary: 154 mg/dL — ABNORMAL HIGH (ref 70–99)
Glucose-Capillary: 154 mg/dL — ABNORMAL HIGH (ref 70–99)
Glucose-Capillary: 161 mg/dL — ABNORMAL HIGH (ref 70–99)
Glucose-Capillary: 168 mg/dL — ABNORMAL HIGH (ref 70–99)

## 2023-05-15 LAB — MAGNESIUM: Magnesium: 1.8 mg/dL (ref 1.7–2.4)

## 2023-05-15 MED ORDER — SODIUM CHLORIDE 0.9% FLUSH
10.0000 mL | Freq: Two times a day (BID) | INTRAVENOUS | Status: DC
  Administered 2023-05-15: 40 mL
  Administered 2023-05-15 – 2023-05-22 (×14): 10 mL

## 2023-05-15 MED ORDER — SODIUM CHLORIDE 0.9% FLUSH: 10.0000 mL | INTRAVENOUS | Status: DC | PRN

## 2023-05-15 MED ORDER — TRACE MINERALS CU-MN-SE-ZN 300-55-60-3000 MCG/ML IV SOLN
INTRAVENOUS | Status: AC
  Filled 2023-05-15: qty 876

## 2023-05-15 MED ORDER — POTASSIUM CHLORIDE 10 MEQ/50ML IV SOLN
10.0000 meq | INTRAVENOUS | Status: AC
  Administered 2023-05-15 (×4): 10 meq via INTRAVENOUS
  Filled 2023-05-15 (×4): qty 50

## 2023-05-15 MED ORDER — MAGNESIUM SULFATE 4 GM/100ML IV SOLN
4.0000 g | Freq: Once | INTRAVENOUS | Status: AC
  Administered 2023-05-15: 4 g via INTRAVENOUS
  Filled 2023-05-15: qty 100

## 2023-05-15 NOTE — Progress Notes (Signed)
PHARMACY - TOTAL PARENTERAL NUTRITION CONSULT NOTE  Indication: Gastrocolic fistula   Patient Measurements: Height: 6\' 3"  (190.5 cm) Weight: 55.6 kg (122 lb 9.2 oz) IBW/kg (Calculated) : 84.5 TPN AdjBW (KG): 53.2 Body mass index is 15.32 kg/m. 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  Patient presented on 7/19 with N/V x 4 days along with LLQ pain.  CT showed thickening of the distal transverse colon with complex fistulous connection between the transverse colon to the gastrojejunal anastomosis and adjacent small bowel loops.  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  Pharmacy consulted to manage TPN.  Patient was started on CLD on 7/22 and reported fullness/abdominal pain after eating.  He reports not eating for a week PTA.  He does not weigh himself, but he knows he has lost a lot of weight; his pants size reduced from a 36 to a 28 over a 6 month time frame.  He typically eats 2 meals a day (lunch and dinner) and he eats protein, vegetables and carb; he rarely eats fruits.   Glucose / Insulin: no hx DM . BG 127-207, used 2 unit SSI 7/31 - significant hypoglycemia on cyclic TPN   8/1 - insulin removed from TPN and BG stabilized Electrolytes: Na 133, Cl low, CO2 high, K 4.2 (ordered 40 mEq IV), Mg still 1.8 (max in TPN; received 2g IV daily since 8/8), CoCa 10.1 others WNL Renal: SCr 0.5, BUN 25, furosemide IV 40 BID since 8/12  Hepatic: from 8/15: AST/ALT trending down, alk phos/tbili wnl, albumin 2, TG WNL Intake / Output; MIVF: on 30 L O2 HFNC with small layering pleural effusions; UOP 2.3 ml/kg/hr, LBM 8/9, net neg 4.5L GI Imaging:  7/25 CT: severe fatty liver, thickened appearance of colon GI Surgeries / Procedures: none since TPN initiation  Central access: PICC placed 04/21/23 TPN start date: 04/22/23  Nutritional Goals: Goal  concentrated TPN rate is 75 mL/hr (provides 131g AA and 2232 kCal per day)    8/1 RD Estimated Needs Total Energy Estimated Needs: 2200-2400 kcals Total Protein Estimated Needs: 120-135 g Total Fluid Estimated Needs: 2L  Current Nutrition:  Clear liquids (for pleasure, poor absorption expected) - eating very little **Patient refusing care TPN  Plan:  Continue concentrated TPN to at goal 75 ml/hr, meeting 100% of estimated needs Electrolytes in TPN: increase Na 100 mEq/L, increase K 90 mEq/L (= 162 mEq/bag), Ca 4 mEq/L, max Mag 15 mEq/L (= ~3 g/day), Phos 28 mmol/L (= 50 mmol/day), max chloride  Add daily multivitamin and trace elements in TPN Stop sensitive SSI Q4H and decrease BG checks to q12 hr  Monitor diuresis plan (reduced 8/12)  Give Mg 4g x1  Team ordered K 40 mEq IV  Standard TPN labs every Mon/Thurs and prn Per surgery, plan for GJ-colonic fistula repair when nutrition status improved Transition to cyclic TPN once oxygen needs and volume status improve - currently requiring too much oxygen support to tolerate cycling  Alphia Moh, PharmD, BCPS, BCCP Clinical Pharmacist  Please check AMION for all [facility] Pharmacy phone numbers After 10:00 PM, call Main Pharmacy [phone]

## 2023-05-15 NOTE — Progress Notes (Signed)
Brief Nutrition Follow-up:  Dietitian Consult received for Assessment; RD already following.   Per RN, pt is requesting diet advancement, wants to eat. Surgery advanced po diet to CL for pleasure only on 8/05. Pt has been taking small amounts of apple juice, water  RD recommends discussing possible diet advancement with general surgery. RD communicated recommendation to RN and Dr. Tonia Brooms.   Last BM 8/09. Intermittent nausea  Remains on HFNC, weaning as off. Off NRB  Continues to tolerate TPN, calorie and protein needs increased on 8/14 and TPN adjusted on 8/15 to meet new needs.   Brent Starcher MS, RDN, LDN, CNSC Registered Dietitian 3 Clinical Nutrition RD Pager and On-Call Pager Number Located in [place]

## 2023-05-15 NOTE — Progress Notes (Signed)
Pt taken off Heated HFNC and placed on 10L HFNC by RT. Pt tolerating well at this time, no increased WOB noted, RN at bedside, Vitals stable, RT will monitor as needed.      05/15/23 1145  Therapy Vitals  Pulse Rate 90  Resp (!) 21  MEWS Score/Color  MEWS Score 1  MEWS Score Color Green  Respiratory Assessment  Assessment Type Assess only  Respiratory Pattern Regular;Unlabored  Chest Assessment Chest expansion symmetrical  Cough None  Sputum Specimen Source Spontaneous cough  Oxygen Therapy/Pulse Ox  O2 Device (S)  HFNC (salter)  O2 Therapy Oxygen humidified  O2 Flow Rate (L/min) (S)  10 L/min  SpO2 98 %

## 2023-05-15 NOTE — Progress Notes (Signed)
Physical Therapy Treatment Patient Details Name: Brent Rogers MRN: [id number] DOB: [birth year] Today's Date: 05/15/2023   History of Present Illness Pt is a 57 y.o. 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 Conservative management with TPN with potential for eventual surgery. Rapid response called 7/26, on BiPAP and s/p thoracentesis 7/26. Intubated 7/27 and self-extubated 7/31, reintubated 7/31-8/3. Pt continues to require HHFNC or HFNC. PMH includes HTN, CAD, GERD, PUD, s/p cholecystectomy, partial pancreatectomy, anxiety, depression, chronic pain, scoliosis, ETOH abuse.    PT Comments  Pt continues to have limited participation with therapy despite max encouragement and education.  He declined any OOB activity or sitting EOB.  He did agree to bed exercises but required encouragement for harder/skilled exercises as he can perform AROM on his own.  Pt required 2-3 breaths between each rep of exercise and 2-3 mins between each exercise or activity to rest.  Reports limited due to nausea and dizziness. Continue plan of care with current recommendation for LTACH.     If plan is discharge home, recommend the following: A lot of help with walking and/or transfers;A lot of help with bathing/dressing/bathroom;Assist for transportation;Help with stairs or ramp for entrance;Assistance with cooking/housework;Direct supervision/assist for medications management   Can travel by private vehicle     No  Equipment Recommendations  Other (comment) (defer)    Recommendations for Other Services       Precautions / Restrictions Precautions Precautions: Fall     Mobility  Bed Mobility Overal bed mobility: Needs Assistance Bed Mobility: Rolling Rolling: Min assist         General bed mobility comments: Rolled both sides x 1-cues to reach; with cues to use rails and bend knees to push -pt was able to push self up in  bed    Transfers                   General transfer comment: Max encouragement and education but pt refused    Ambulation/Gait                   Stairs             Wheelchair Mobility     Tilt Bed    Modified Rankin (Stroke Patients Only)       Balance Overall balance assessment: Needs assistance                                          Cognition Arousal: Alert Behavior During Therapy: Flat affect Overall Cognitive Status: No family/caregiver present to determine baseline cognitive functioning                                 General Comments: Pt low motivation.  RN reports pt said "he'd rather get up when therapy comes" - pt declines saying this        Exercises Other Exercises Other Exercises: Pull to long sit x 3 Other Exercises: Bridging x 5 with bil LE -cues to keep legs stable Other Exercises: Reaching across body and lifting shoulder with R-Tband (rolling motion) x 5    General Comments General comments (skin integrity, edema, etc.):   Pt on 6 L HFNC with VSS. Pt frequently wanting rest breaks to breaht but sats remaining >95%.  He  takes at least 3 breaths between each rep of exercise and at least 2-3 mins between each exercise or activity to rest.   Entered room and stated from therapy and "we are here to help get you out of bed today."  Pt reports "that's not happening."  Educated on importance of mobility but he declined stating "I just want to rest today."  Discussed with pt that needs to mobilize frequently and hasn't been OOB with therapy since Wednesday.  Even discussed wouldn't have therapy over weekend so could rest then.  Tried to even encourage just sitting EOB but he continued to decline stating "doesn't want to feel sick the rest of the day."  Pt agreeable to exercise but with encouragement for harder exercises as he is able to do AROM on his own.      Pertinent Vitals/Pain Pain  Assessment Pain Assessment: No/denies pain    Home Living                          Prior Function            PT Goals (current goals can now be found in the care plan section) Acute Rehab PT Goals Patient Stated Goal: "rest today" PT Goal Formulation: With patient Time For Goal Achievement: 05/29/23 Potential to Achieve Goals: Fair Progress towards PT goals: Not progressing toward goals - comment;Goals downgraded-see care plan    Frequency    Min 1X/week      PT Plan      Co-evaluation              AM-PAC PT "6 Clicks" Mobility   Outcome Measure  Help needed turning from your back to your side while in a flat bed without using bedrails?: A Little Help needed moving from lying on your back to sitting on the side of a flat bed without using bedrails?: A Lot Help needed moving to and from a bed to a chair (including a wheelchair)?: Total Help needed standing up from a chair using your arms (e.g., wheelchair or bedside chair)?: Total Help needed to walk in hospital room?: Total Help needed climbing 3-5 steps with a railing? : Total 6 Click Score: 9    End of Session Equipment Utilized During Treatment: Gait belt;Oxygen Activity Tolerance: Patient limited by fatigue (Limited by fatigue, reports nausea, and motivation) Patient left: in bed;with call bell/phone within reach;with bed alarm set Nurse Communication: Mobility status PT Visit Diagnosis: Muscle weakness (generalized) (M62.81);Other abnormalities of gait and mobility (R26.89)     Time: 4098-1191 PT Time Calculation (min) (ACUTE ONLY): 25 min  Charges:    $Therapeutic Exercise: 23-37 mins PT General Charges $$ ACUTE PT VISIT: 1 Visit                     Anise Salvo, PT [facility] [phone]    Rayetta Humphrey 05/15/2023, 4:04 PM

## 2023-05-15 NOTE — Consult Note (Addendum)
WOC Nurse Consult Note: Consult requested for a newly noted darker colored Deep tissue pressure injury (DTPI) and blistering to bilat buttocks. Each side is approx 2X2cm and the skin is loose and beginning to peel when assessed.  DTPI are high risk to evolve into full thickness tissue loss within 7-10 days.  Pt is critically ill with multiple systemic factors which can impair healing nd has been reluctant to turn, according to the bedside nurse. He is very thin and is on a low airloss mattress to reduce pressure.  Pressure Injury POA: No Topical treatment orders provided for bedside nurses to perform as follows:  Measurement:Apply double-folded Xeroform gauze to bilat buttocks Q day, then cover with foam dressing.  Change foam dressing Q 3 days or PRN soiling. WOC team will reassess the location next week to determine if a change in the plan of care is indicated at that time.  Thank-you,  Cammie Mcgee MSN, RN, CWOCN, [place], CNS [phone]

## 2023-05-15 NOTE — Progress Notes (Signed)
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  Attempting conservative management and TPN to try to help nutritional status.  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  Denies any frank aspiration, breathing worsened after a BM.  CXR with worsening bilateral infiltrates. After discussion with rapid response and crosscover TRH, PCCM consulted to assist with management.  Patient states breathing is stable from this am, has not been able to sleep due to anxiety over SOB. 2022 echo normal  Pertinent  Medical History  Chronic pancreatitis Prior Whipple Severe protein calorie malnutrition Question of alcohol abuse but multiple previous notes state his use is modest at best.  I do see a level of 134 back in 2021. HTN  Significant Hospital Events: Including procedures, antibiotic start and stop dates in addition to other pertinent events   7/19 admit 7/26 thoracentesis with 800 cc of fluid obtained 7/29 tolerating SBT 7/30 self extubated during SBT. 7/31 reintubated for increased respiratory distress. 8/1 failed SBT, significant agitation  8/2 Failed SBT again this am due to agitation, remains on fentanyl and propofol  8/12 remains on high flow nasal cannula, difficulty weaning, consult to palliative 8/13 still trying to wean... however patient refused PT yesterady   8/14 minimal to no improvement, refusing care, refusing turns, refusing PT  8/16 remains on HHFNC   Interim History / Subjective:   Remains on O2 support. Difficult to wean. On HHFNC   Objective   Blood pressure 122/85, pulse 98, temperature 97.9 F (36.6  C), temperature source Oral, resp. rate (!) 26, height 6\' 3"  (1.905 m), weight 55.6 kg, SpO2 95%.    FiO2 (%):  [40 %-44 %] 44 %   Intake/Output Summary (Last 24 hours) at 05/15/2023 0902 Last data filed at 05/15/2023 7829 Gross per 24 hour  Intake 2080.15 ml  Output 2815 ml  Net -734.85 ml   Filed Weights   05/13/23 0500 05/14/23 0500 05/15/23 0500  Weight: 54.7 kg 54.5 kg 55.6 kg    Examination: General: chronically ill, cachetic  HEENT: NCAT, thin, muscle wasting Neuro: Alert following commands  Chest: diminished breath sounds  Heart: tachycardic  Abdomen: Thin, soft Skin: Sores present         Labs and images were reviewed  Resolved problems:  Septic shock  Severe leukemoid reaction  Hyponatremia  Hypophosphatemia Hypomagnesemia ARDS Acute urinary tract infection with Klebsiella Right pleural effusion s/p thoracentesis Hypernatremia Thrombocytopenia  Assessment & Plan:   Acute hypoxic respiratory failure Recurrent aspiration pneumonia Received Ceftriaxone 7/19-7/25, Flagyl 7/19-7/25, Zosyn 7/25-7/30, Vanc 7/26-7/28, and Micafungin 7/26-7/28 Plan: Remains on HHFNC  Remains severely malnourished  He is refusing PT and turns I am worried about his food and calorie intake   Gastrocolic fistula, being manage conservatively for now - Will likely need surgical repair once medically optimized  Acute colitis Pancreatic mass status post Whipple's 2015 Severe protein calorie malnutrition  General Surgery is following intermittently Continue TPN  Would like to advance diet if possible   Obstipation Continue bowel regimen   Hypokalemia Hypophosphatemia Hypomagnesemia Continue replete electrolytes   Anemia of critical illness Monitor H&H and transfuse if less than 7  DVT  of unknown duration Continue therapeutic lovenox   Bilateral foot wounds, POA Continue as needed morphine Wound care consultation.   Best Practice (right click and "Reselect  all SmartList Selections" daily)   Diet/type: TPN, clear liquid diet DVT prophylaxis: Therapeutic Lovenox GI prophylaxis: PPI Lines: Central line for TPN Foley:  N/A Code Status:  full code Last date of multidisciplinary goals of care discussion.   This patient is critically ill with multiple organ system failure; which, requires frequent high complexity decision making, assessment, support, evaluation, and titration of therapies. This was completed through the application of advanced monitoring technologies and extensive interpretation of multiple databases. During this encounter critical care time was devoted to patient care services described in this note for 32 minutes.  Josephine Igo, DO [facility] 05/15/2023 9:02 AM

## 2023-05-16 ENCOUNTER — Inpatient Hospital Stay (HOSPITAL_COMMUNITY): Payer: BLUE CROSS/BLUE SHIELD

## 2023-05-16 DIAGNOSIS — E871 Hypo-osmolality and hyponatremia: Secondary | ICD-10-CM | POA: Diagnosis not present

## 2023-05-16 LAB — HEPARIN ANTI-XA: Heparin LMW: 0.54 [IU]/mL

## 2023-05-16 LAB — MAGNESIUM: Magnesium: 1.8 mg/dL (ref 1.7–2.4)

## 2023-05-16 LAB — BASIC METABOLIC PANEL
Anion gap: 8 (ref 5–15)
BUN: 26 mg/dL — ABNORMAL HIGH (ref 6–20)
CO2: 31 mmol/L (ref 22–32)
Calcium: 8.9 mg/dL (ref 8.9–10.3)
Chloride: 95 mmol/L — ABNORMAL LOW (ref 98–111)
Creatinine, Ser: 0.63 mg/dL (ref 0.61–1.24)
GFR, Estimated: >60 mL/min (ref >60)
Glucose, Bld: 146 mg/dL — ABNORMAL HIGH (ref 70–99)
Potassium: 4.6 mmol/L (ref 3.5–5.1)
Sodium: 134 mmol/L — ABNORMAL LOW (ref 135–145)

## 2023-05-16 LAB — GLUCOSE, CAPILLARY
Glucose-Capillary: 163 mg/dL — ABNORMAL HIGH (ref 70–99)
Glucose-Capillary: 146 mg/dL — ABNORMAL HIGH (ref 70–99)
Glucose-Capillary: 143 mg/dL — ABNORMAL HIGH (ref 70–99)

## 2023-05-16 LAB — PHOSPHORUS: Phosphorus: 3.8 mg/dL (ref 2.5–4.6)

## 2023-05-16 MED ORDER — TRACE MINERALS CU-MN-SE-ZN 300-55-60-3000 MCG/ML IV SOLN
INTRAVENOUS | Status: AC
  Filled 2023-05-16: qty 876

## 2023-05-16 MED ORDER — CHLORHEXIDINE GLUCONATE CLOTH 2 % EX PADS
6.0000 | MEDICATED_PAD | Freq: Every day | CUTANEOUS | Status: DC
  Administered 2023-05-16 – 2023-05-22 (×9): 6 via TOPICAL

## 2023-05-16 MED ORDER — MAGNESIUM SULFATE 4 GM/100ML IV SOLN
4.0000 g | Freq: Once | INTRAVENOUS | Status: AC
  Administered 2023-05-16: 4 g via INTRAVENOUS
  Filled 2023-05-16: qty 100

## 2023-05-16 NOTE — Progress Notes (Signed)
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  Attempting conservative management and TPN to try to help nutritional status.  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  Denies any frank aspiration, breathing worsened after a BM.  CXR with worsening bilateral infiltrates. After discussion with rapid response and crosscover TRH, PCCM consulted to assist with management.  Patient states breathing is stable from this am, has not been able to sleep due to anxiety over SOB. 2022 echo normal  Pertinent  Medical History  Chronic pancreatitis Prior Whipple Severe protein calorie malnutrition Question of alcohol abuse but multiple previous notes state his use is modest at best.  I do see a level of 134 back in 2021. HTN  Significant Hospital Events: Including procedures, antibiotic start and stop dates in addition to other pertinent events   7/19 admit 7/26 thoracentesis with 800 cc of fluid obtained 7/29 tolerating SBT 7/30 self extubated during SBT. 7/31 reintubated for increased respiratory distress. 8/1 failed SBT, significant agitation  8/2 Failed SBT again this am due to agitation, remains on fentanyl and propofol  8/12 remains on high flow nasal cannula, difficulty weaning, consult to palliative 8/13 still trying to wean... however patient refused PT yesterady   8/14 minimal to no improvement, refusing care, refusing turns, refusing PT  8/16 remains on [facility]  8/17 off HHFNC, now on salter catheter, o2 requirements improving   Interim History / Subjective:   Remains on 02 support. Refusing mobility and PT. Continued  GOC discussions   Objective   Blood pressure 114/78, pulse 98, temperature 98 F (36.7 C), temperature source Oral, resp. rate (!) 23, height 6\' 3"  (1.905 m), weight 55.6 kg, SpO2 97%.        Intake/Output Summary (Last 24 hours) at 05/16/2023 0817 Last data filed at 05/16/2023 0800 Gross per 24 hour  Intake 2193.34 ml  Output 1735 ml  Net 458.34 ml   Filed Weights   05/13/23 0500 05/14/23 0500 05/15/23 0500  Weight: 54.7 kg 54.5 kg 55.6 kg    Examination: General: Chronically ill, thin cachectic HEENT: NCAT mucous membranes dry muscle wasting present Neuro: Alert oriented following commands Chest: Diminished breath sounds bilaterally Heart: Regular rate rhythm S1-S2 Abdomen: Thin soft Skin: Sores present         Labs and images were reviewed  Resolved problems:  Septic shock  Severe leukemoid reaction  Hyponatremia  Hypophosphatemia Hypomagnesemia ARDS Acute urinary tract infection with Klebsiella Right pleural effusion s/p thoracentesis Hypernatremia Thrombocytopenia  Assessment & Plan:   Acute hypoxic respiratory failure Recurrent aspiration pneumonia Received Ceftriaxone 7/19-7/25, Flagyl 7/19-7/25, Zosyn 7/25-7/30, Vanc 7/26-7/28, and Micafungin 7/26-7/28 Plan: Weaned from heated high flow down to salter catheter. Goal SpO2 greater than 88% Repeat chest x-ray today. I encouraged him once again to work with PT continue to turn and get up in the chair if at all possible with assistance. I am still worried about his food and calorie intake I do not think organ to be able to maintain on TPN. This is a recurrent battle for sure because of not sure that his gastrocolic fistula will ever heal without appropriate nutrition.  Gastrocolic fistula,  being manage conservatively for now - Will likely need surgical repair once medically optimized  Acute colitis Pancreatic mass status post Whipple's 2015 Severe protein calorie malnutrition  Plan: General Surgery  intermittently following. Continue TPN Discussed diet options with him yesterday. Surgery is okay with allowing him to eat anything that he would like but they understand that it would increase his risk for ongoing diarrhea due to the fistula  Obstipation Continue bowel regimen   Hypokalemia Hypophosphatemia Hypomagnesemia Plan: Continue to watch electrolytes while on TPN  Anemia of critical illness Monitor H&H and transfuse if less than 7  DVT of unknown duration Continue therapeutic lovenox   Bilateral foot wounds, POA Continue as needed morphine Wound care consultation.   Best Practice (right click and "Reselect all SmartList Selections" daily)   Diet/type: TPN, clear liquid diet DVT prophylaxis: Therapeutic Lovenox GI prophylaxis: PPI Lines: Central line for TPN Foley:  N/A Code Status:  full code Last date of multidisciplinary goals of care discussion.    Josephine Igo, DO [facility] 05/16/2023 8:17 AM

## 2023-05-16 NOTE — Progress Notes (Signed)
PHARMACY - TOTAL PARENTERAL NUTRITION CONSULT NOTE  Indication: Gastrocolic fistula   Patient Measurements: Height: 6\' 3"  (190.5 cm) Weight: 55.6 kg (122 lb 9.2 oz) IBW/kg (Calculated) : 84.5 TPN AdjBW (KG): 53.2 Body mass index is 15.32 kg/m. 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  Patient presented on 7/19 with N/V x 4 days along with LLQ pain.  CT showed thickening of the distal transverse colon with complex fistulous connection between the transverse colon to the gastrojejunal anastomosis and adjacent small bowel loops.  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  Pharmacy consulted to manage TPN.  Patient was started on CLD on 7/22 and reported fullness/abdominal pain after eating.  He reports not eating for a week PTA.  He does not weigh himself, but he knows he has lost a lot of weight; his pants size reduced from a 36 to a 28 over a 6 month time frame.  He typically eats 2 meals a day (lunch and dinner) and he eats protein, vegetables and carb; he rarely eats fruits.   Glucose / Insulin: no hx DM . BG <180, used 1 unit SSI 7/31 - significant hypoglycemia on cyclic TPN   8/1 - insulin removed from TPN and BG stabilized Electrolytes:  Na 134, K 4.6 (received  40 mEq IV), Cl 95, Mg still 1.8 (max in TPN; received 2-4g IV daily since 8/8), CoCa 10.5, others WNL Renal: SCr 0.5, BUN 25, furosemide IV 40 BID since 8/12  Hepatic: from 8/15: AST/ALT 58/54 down, alk phos/tbili/TG wnl, albumin 2 Intake / Output; MIVF: on 5 L O2 HFNC with small layering pleural effusions; UOP 1.3 ml/kg/hr, LBM 8/9, net neg 4.5L GI Imaging:  7/25 CT: severe fatty liver, thickened appearance of colon GI Surgeries / Procedures: none since TPN initiation  Central access: PICC placed 04/21/23 TPN start date: 04/22/23  Nutritional Goals: Goal concentrated TPN  rate is 75 mL/hr (provides 131g AA and 2232 kCal per day)    8/1 RD Estimated Needs Total Energy Estimated Needs: 2200-2400 kcals Total Protein Estimated Needs: 120-135 g Total Fluid Estimated Needs: 2L  Current Nutrition:  Clear liquids (for pleasure, poor absorption expected) - eating very little **Patient refusing care TPN  Plan:  Continue concentrated TPN to at goal 75 ml/hr, meeting 100% of estimated needs Electrolytes in TPN: increase Na 150 mEq/L, decrease K 75 mEq/L (= 135 mEq/bag), decrease Ca 2 mEq/L, max Mag 15 mEq/L (= ~3 g/day), decrease Phos 22 mmol/L, max chloride  Add daily multivitamin and trace elements in TPN Give Mg 4g IV  Continue BG checks to q12 hr Monitor diuresis plan (reduced 8/12)    Standard TPN labs every Mon/Thurs and prn Per surgery, plan for GJ-colonic fistula repair when nutrition status improved Will transition to cyclic TPN if oxygen requirement remains low 8/18   Alphia Moh, PharmD, BCPS, [facility] Clinical Pharmacist  Please check AMION for all [facility] Pharmacy phone numbers After 10:00 PM, call Main Pharmacy [phone]

## 2023-05-17 DIAGNOSIS — E871 Hypo-osmolality and hyponatremia: Secondary | ICD-10-CM | POA: Diagnosis not present

## 2023-05-17 DIAGNOSIS — Z515 Encounter for palliative care: Secondary | ICD-10-CM | POA: Diagnosis not present

## 2023-05-17 DIAGNOSIS — Z7189 Other specified counseling: Secondary | ICD-10-CM | POA: Diagnosis not present

## 2023-05-17 LAB — GLUCOSE, CAPILLARY
Glucose-Capillary: 168 mg/dL — ABNORMAL HIGH (ref 70–99)
Glucose-Capillary: 144 mg/dL — ABNORMAL HIGH (ref 70–99)

## 2023-05-17 LAB — MAGNESIUM: Magnesium: 1.8 mg/dL (ref 1.7–2.4)

## 2023-05-17 MED ORDER — MAGNESIUM SULFATE 4 GM/100ML IV SOLN
4.0000 g | Freq: Once | INTRAVENOUS | Status: AC
  Administered 2023-05-17: 4 g via INTRAVENOUS
  Filled 2023-05-17: qty 100

## 2023-05-17 MED ORDER — TRACE MINERALS CU-MN-SE-ZN 300-55-60-3000 MCG/ML IV SOLN
INTRAVENOUS | Status: AC
  Filled 2023-05-17: qty 876

## 2023-05-17 NOTE — Progress Notes (Signed)
PHARMACY - TOTAL PARENTERAL NUTRITION CONSULT NOTE  Indication: Gastrocolic fistula   Patient Measurements: Height: 6\' 3"  (190.5 cm) Weight: 55.6 kg (122 lb 9.2 oz) IBW/kg (Calculated) : 84.5 TPN AdjBW (KG): 53.2 Body mass index is 15.32 kg/m. 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  Patient presented on 7/19 with N/V x 4 days along with LLQ pain.  CT showed thickening of the distal transverse colon with complex fistulous connection between the transverse colon to the gastrojejunal anastomosis and adjacent small bowel loops.  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  Pharmacy consulted to manage TPN.  Patient was started on CLD on 7/22 and reported fullness/abdominal pain after eating.  He reports not eating for a week PTA.  He does not weigh himself, but he knows he has lost a lot of weight; his pants size reduced from a 36 to a 28 over a 6 month time frame.  He typically eats 2 meals a day (lunch and dinner) and he eats protein, vegetables and carb; he rarely eats fruits.   Glucose / Insulin: no hx DM . BG <180, off SSI 7/31 - significant hypoglycemia on cyclic TPN   8/1 - insulin removed from TPN and BG stabilized Electrolytes:  last labs 8/17- Na 134, K 4.6 (received  40 mEq IV), Cl 95, Mg still 1.8 (max in TPN; received 2-4g IV daily since 8/8), CoCa 10.5, others WNL Renal: SCr 0.5, BUN 25, furosemide IV 40 BID since 8/12  Hepatic: from 8/15: AST/ALT 58/54 down, alk phos/tbili/TG wnl, albumin 2 Intake / Output; MIVF: on 5 L O2 HFNC with small layering pleural effusions; UOP 2.1 ml/kg/hr, LBM 8/9, net neg 4.5L GI Imaging:  7/25 CT: severe fatty liver, thickened appearance of colon GI Surgeries / Procedures: none since TPN initiation  Central access: PICC placed 04/21/23 TPN start date: 04/22/23  Nutritional Goals: Goal  concentrated TPN rate is 75 mL/hr (provides 131g AA and 2232 kCal per day)    8/1 RD Estimated Needs Total Energy Estimated Needs: 2200-2400 kcals Total Protein Estimated Needs: 120-135 g Total Fluid Estimated Needs: 2L  Current Nutrition:  Clear liquids (for pleasure, poor absorption expected) - eating very little **Patient refusing some care TPN  Plan:  Cycle concentrated TPN over 20 hr (rate 47-95 ml/hr, GIR 2.4-48 mg/kg/min), meeting 100% of estimated needs Electrolytes in TPN: Na 150 mEq/L, decrease K 70 mEq/L (= 126 mEq/bag), Ca 2 mEq/L, max Mag 15 mEq/L (= ~3 g/day), decrease Phos 20 mmol/L, max chloride  Add daily multivitamin and trace elements in TPN Give Mg 4g IV  Continue BG checks to q12 hr Monitor diuresis plan (reduced 8/12)    Standard TPN labs every Mon/Thurs and prn Per surgery, plan for GJ-colonic fistula repair when nutrition status improved  Cycle to 16 hours if tolerating, watch O2 requirement   Alphia Moh, PharmD, BCPS, [facility] Clinical Pharmacist  Please check AMION for all [facility] Pharmacy phone numbers After 10:00 PM, call Main Pharmacy [phone]

## 2023-05-17 NOTE — Progress Notes (Signed)
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  General surgery consulted, for now attempting conservative management with TPN to improve nutritional status.  He will likely need repair at 1 point.  Initially admitted to the hospitalist service and transferred to the ICU due to worsening respiratory status.  Back to the hospitalist service on 8/18  Significant events: 7/19 admit 7/26 thoracentesis with 800 cc of fluid obtained 7/29 tolerating SBT 7/30 self extubated during SBT. 7/31 reintubated for increased respiratory distress. 8/1 failed SBT, significant agitation  8/2 Failed SBT again this am due to agitation, remains on fentanyl and propofol  8/12 remains on high flow nasal cannula, difficulty weaning, consult to palliative 8/13 still trying to wean... however patient refused PT yesterady   8/14 minimal to no improvement, refusing care, refusing turns, refusing PT  8/16 remains on [facility]  8/17 off HHFNC, now on salter catheter, o2 requirements improving   Subjective / 24h Interval events: Does not communicate much this morning  Assesement and Plan: Principal Problem:   Hyponatremia Active Problems:   Pancreatic insufficiency   HTN (hypertension)   GERD (gastroesophageal reflux disease)   History of partial pancreatectomy   Chronic pain syndrome   Iron deficiency anemia   Atherosclerosis of native coronary artery of native heart without angina pectoris   Major depressive disorder, recurrent episode, severe (HCC)   GAD (generalized anxiety disorder)   Gastric ulcer without hemorrhage or perforation   Transaminitis   Colitis   UTI (urinary tract infection)   Acute respiratory failure with hypoxia  (HCC)   Principal problem Acute hypoxic respiratory failure, recurrent aspiration pneumonia - Received Ceftriaxone 7/19-7/25, Flagyl 7/19-7/25, Zosyn 7/25-7/30, Vanc 7/26-7/28, and Micafungin 7/26-7/28 -Monitored off antibiotics for now -He has been slow working with PT  Active problems Gastrocolic fistula, being manage conservatively for now - Will likely need surgical repair once medically optimized, but overall quite difficult with TPN only.  I will reach out to surgical team tomorrow  Acute colitis, pancreatic mass status post Whipple's 2015, severe protein calorie malnutrition - General Surgery intermittently following.  Continue TPN. -Surgery is okay with allowing him to eat anything that he would like but they understand that it would increase his risk for ongoing diarrhea due to the fistula   Obstipation - Continue bowel regimen    Hypokalemia, Hypophosphatemia, Hypomagnesemia - Continue to watch electrolytes while on TPN, replete as indicated   Anemia of critical illness - Monitor H&H and transfuse if less than 7   DVT of unknown duration - Continue therapeutic lovenox    Bilateral foot wounds, POA - Continue as needed morphine.  Wound care consult  Scheduled Meds:  Chlorhexidine Gluconate Cloth  6 each Topical Q0600   enoxaparin (LOVENOX) injection  80 mg Subcutaneous Q12H   furosemide  40 mg Intravenous BID   hydrocerin   Topical BID   pantoprazole (PROTONIX) IV  40 mg Intravenous QHS   senna-docusate  1 tablet Oral QHS   sodium chloride flush  10-40 mL Intracatheter Q12H   traZODone  50 mg Oral QHS   Continuous Infusions:  TPN ADULT (ION) 75 mL/hr at 05/17/23 0900   TPN CYCLIC-ADULT (ION)     PRN Meds:.acetaminophen **OR** acetaminophen, albuterol, hydrALAZINE, metoCLOPramide (REGLAN)  injection, morphine injection, ondansetron (ZOFRAN) IV, mouth rinse, phenol, sodium chloride flush  Current Outpatient Medications  Medication Instructions   acetaminophen (TYLENOL)  500 mg, Oral, Every 6 hours PRN   dicyclomine (BENTYL) 20 mg, Oral, 2 times daily   lipase/protease/amylase (CREON) 36,000 Units, Oral, 3 times daily with meals   metoCLOPramide (REGLAN) 10 mg, Oral, Every 6 hours PRN   ondansetron (ZOFRAN) 4 mg, Oral, Every 8 hours PRN   ondansetron (ZOFRAN-ODT) 4 mg, Oral, Every 8 hours PRN   pantoprazole (PROTONIX) 40 mg, Oral, 2 times daily   promethazine (PHENERGAN) 25 mg, Oral, Every 6 hours PRN    Diet Orders (From admission, onward)     Start     Ordered   05/04/23 1642  Diet clear liquid Room service appropriate? Yes; Fluid consistency: Thin; Fluid restriction: 1200 mL Fluid  Diet effective now       Comments: Pt only wants juice and ice water. RN to consider diluting juice with water, may help with tolerance.  Question Answer Comment  Room service appropriate? Yes   Fluid consistency: Thin   Fluid restriction: 1200 mL Fluid      05/04/23 1642            DVT prophylaxis: Place TED hose Start: 05/03/23 0804 SCDs Start: 04/17/23 1802   Lab Results  Component Value Date   PLT 484 (H) 05/15/2023      Code Status: Full Code  Family Communication: no family at bedside   Status is: Inpatient Remains inpatient appropriate because: severity of illness  Level of care: Progressive  Consultants:  Surgery GI PCCM Palliative  Objective: Vitals:   05/17/23 0845 05/17/23 0900 05/17/23 1000 05/17/23 1122  BP:  126/87 112/81   Pulse:  (!) 108 (!) 103   Resp:  (!) 24 17   Temp: 98.2 F (36.8 C)   (!) 97.5 F (36.4 C)  TempSrc:    Axillary  SpO2:  97% 98%   Weight:      Height:        Intake/Output Summary (Last 24 hours) at 05/17/2023 1312 Last data filed at 05/17/2023 1046 Gross per 24 hour  Intake 2465.27 ml  Output 2450 ml  Net 15.27 ml   Wt Readings from Last 3 Encounters:  05/15/23 55.6 kg  03/04/23 54.4 kg  12/11/22 68 kg    Examination:  Constitutional: NAD Eyes: no scleral icterus ENMT: Mucous membranes  are moist.  Neck: normal, supple Respiratory: clear to auscultation bilaterally, no wheezing, no crackles. Normal respiratory effort. No accessory muscle use.  Cardiovascular: Regular rate and rhythm, no murmurs / rubs / gallops. No LE edema.  Abdomen: non distended, no tenderness. Bowel sounds positive.  Musculoskeletal: no clubbing / cyanosis.   Data Reviewed: I have independently reviewed following labs and imaging studies   CBC Recent Labs  Lab 05/11/23 0221 05/14/23 0530 05/15/23 0422  WBC 25.5* 21.4* 25.8*  HGB 8.3* 8.0* 8.0*  HCT 27.0* 25.5* 26.0*  PLT 304 412* 484*  MCV 96.4 95.5 95.9  MCH 29.6 30.0 29.5  MCHC 30.7 31.4 30.8  RDW 19.9* 17.5* 17.3*    Recent Labs  Lab 05/11/23 0221 05/11/23 1810 05/12/23 0027 05/13/23 0529 05/14/23 0530 05/15/23 0422 05/16/23 0754 05/17/23 0355  NA 135   < > 134* 136 132* 133* 134*  --   K 4.8   < > 4.3 4.1 4.2 4.1 4.6  --   CL 99   < > 97* 95* 92* 93*  95*  --   CO2 27   < > 30 30 31  33* 31  --   GLUCOSE 138*   < > 149* 138* 142* 151* 146*  --   BUN 22*   < > 21* 23* 23* 25* 26*  --   CREATININE 0.60*   < > 0.46* 0.50* 0.66 0.53* 0.63  --   CALCIUM 8.2*   < > 8.1* 8.6* 8.4* 8.5* 8.9  --   AST 60*  --   --   --  58*  --   --   --   ALT 46*  --   --   --  54*  --   --   --   ALKPHOS 102  --   --   --  110  --   --   --   BILITOT 0.9  --   --   --  0.6  --   --   --   ALBUMIN 2.2*  --   --   --  2.0*  --   --   --   MG 1.8  --  1.8 1.8 1.8 1.8 1.8 1.8   < > = values in this interval not displayed.    ------------------------------------------------------------------------------------------------------------------ No results for input(s): "CHOL", "HDL", "LDLCALC", "TRIG", "CHOLHDL", "LDLDIRECT" in the last 72 hours.  Lab Results  Component Value Date   HGBA1C 5.1 05/10/2023   ------------------------------------------------------------------------------------------------------------------ No results for input(s): "TSH",  "T4TOTAL", "T3FREE", "THYROIDAB" in the last 72 hours.  Invalid input(s): "FREET3"  Cardiac Enzymes No results for input(s): "CKMB", "TROPONINI", "MYOGLOBIN" in the last 168 hours.  Invalid input(s): "CK" ------------------------------------------------------------------------------------------------------------------    Component Value Date/Time   BNP 571.3 (H) 04/24/2023 0858    CBG: Recent Labs  Lab 05/15/23 2344 05/16/23 0343 05/16/23 0829 05/16/23 2039 05/17/23 0844  GLUCAP 161* 163* 146* 143* 168*    Recent Results (from the past 240 hour(s))  Expectorated Sputum Assessment w Gram Stain, Rflx to Resp Cult     Status: None   Collection Time: 05/10/23  4:33 AM   Specimen: Sputum  Result Value Ref Range Status   Specimen Description SPUTUM  Final   Special Requests NONE  Final   Sputum evaluation   Final    THIS SPECIMEN IS ACCEPTABLE FOR SPUTUM CULTURE Performed at [facility], [address] [place], [place] [address]    Report Status 05/10/2023 FINAL  Final  Culture, Respiratory w Gram Stain     Status: None   Collection Time: 05/10/23  4:33 AM   Specimen: SPU  Result Value Ref Range Status   Specimen Description SPUTUM  Final   Special Requests NONE Reflexed from [id number]  Final   Gram Stain   Final    FEW WBC PRESENT, PREDOMINANTLY PMN FEW BUDDING YEAST SEEN RARE GRAM POSITIVE COCCI    Culture   Final    Normal respiratory flora-no Staph aureus or Pseudomonas seen Performed at [facility], [address], [place], [place] [address]    Report Status 05/12/2023 FINAL  Final     Radiology Studies: No results found.   Pamella Pert, MD, PhD [facility]  Between 7 am - 7 pm I am available, please contact me via Amion (for emergencies) or Securechat (non urgent messages)  Between 7 pm - 7 am I am not available, please contact night coverage MD/APP via Amion

## 2023-05-17 NOTE — Progress Notes (Signed)
Palliative Medicine Inpatient Follow Up Note HPI: 57 y.o. 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 Conservative management and TPN to try to help nutritional status.   Today's Discussion 05/17/2023  *Please note that this is a verbal dictation therefore any spelling or grammatical errors are due to the "[employer]agon [employer] One" system interpretation.  Chart reviewed inclusive of vital signs, progress notes, laboratory results, and diagnostic images.   I met with Brent Rogers's RN this morning who shares that he often does not want to do what is being asked of him in regards to medical care.   I sat with Brent Rogers. He shares that he feels the medical services awaken him at all hours and he vocalizes frustration with this. I stated that this is their job and it is sometimes necessary to do things during a certain time window. Brent Rogers asks if morning routine could be shifted to later in the morning. He states that he does not have enough "fuel" to do it early in the morning. He shares the exhaustion related to being in the hospital. I shared that if at any time he feels enough is enough then we can shift our focus for him to allow dignity and comfort.   We reviewed the idea of comfort emphasis of care though Brent Rogers shares he is not ready to leave this earth yet. He does want all present interventions and also could like resuscitation despite the trauma it may cause. In the larger context we discussed quality of life and what would and would not be acceptable to Brent Rogers. He shares that he would never want to live a life confined to the bed and dependent on other for all activities of daily living. He wants to be able to mobilize and interact meaningfully with his family.  We reviewed  the importance of advanced care planning. Brent Rogers shares he would want his brother, Brent Rogers to be his surrogate Management consultant.  I asked him if he had given any thought to creating a living will which he shares he had not. We reviewed that this is an important document to help guide his family in terms of this wishes. Brent Rogers shares that he would be willing to complete this with the PMT tomorrow around noon.   Created space and opportunity for patient to explore thoughts feelings and fears regarding current medical situation. Brought up concern in the setting of Brent Rogers's advance frailty and poor nutrition - dependent on TPN.   Questions and concerns addressed/Palliative Support Provided.   Objective Assessment: Vital Signs Vitals:   05/17/23 0900 05/17/23 1000  BP: 126/87 112/81  Pulse: (!) 108 (!) 103  Resp: (!) 24 17  Temp:    SpO2: 97% 98%    Intake/Output Summary (Last 24 hours) at 05/17/2023 1059 Last data filed at 05/17/2023 1046 Gross per 24 hour  Intake 3020.97 ml  Output 3000 ml  Net 20.97 ml   Last Weight  Most recent update: 05/15/2023  7:02 AM    Weight  55.6 kg (122 lb 9.2 oz)            Gen:  Middle Aged AA M in NAD HEENT: Dry mucous membranes CV: Irregular rate and rhythm  PULM: On 5LPM [place], breathing is even and nonlabored ABD: soft/nontender  EXT: Cachectic Neuro: Alert and oriented x3  SUMMARY OF RECOMMENDATIONS   Full Code/ Full Scope of Care  Patient desires morning activities (labs, vitals, cleaning) be modified to later in the morning  Patient willing to complete Advance Directives --> have requested chaplain involvement  Goals at this time are to see if improvements can be made --> patient aware that if at any time he desire stopping aggressive measures that this could be pursued through comfort focused care  Ongoing PMT support  Time Spent: 65 Billing based on MDM:  High ______________________________________________________________________________________ [name] Palliative Medicine Team Team Cell Phone: [phone] Please utilize secure chat with additional questions, if there is no response within 30 minutes please call the above phone number  Palliative Medicine Team providers are available by phone from 7am to 7pm daily and can be reached through the team cell phone.  Should this patient require assistance outside of these hours, please call the patient's attending physician.

## 2023-05-17 NOTE — Plan of Care (Signed)
°  Problem: Clinical Measurements: Goal: Respiratory complications will improve Outcome: Progressing   Problem: Coping: Goal: Level of anxiety will decrease Outcome: Progressing   Problem: Elimination: Goal: Will not experience complications related to urinary retention Outcome: Progressing   Problem: Pain Managment: Goal: General experience of comfort will improve Outcome: Progressing   Problem: Safety: Goal: Ability to remain free from injury will improve Outcome: Progressing

## 2023-05-18 DIAGNOSIS — E871 Hypo-osmolality and hyponatremia: Secondary | ICD-10-CM | POA: Diagnosis not present

## 2023-05-18 LAB — COMPREHENSIVE METABOLIC PANEL
ALT: 37 U/L (ref 0–44)
AST: 30 U/L (ref 15–41)
Albumin: 2.1 g/dL — ABNORMAL LOW (ref 3.5–5.0)
Alkaline Phosphatase: 127 U/L — ABNORMAL HIGH (ref 38–126)
Anion gap: 8 (ref 5–15)
BUN: 27 mg/dL — ABNORMAL HIGH (ref 6–20)
CO2: 29 mmol/L (ref 22–32)
Calcium: 8.8 mg/dL — ABNORMAL LOW (ref 8.9–10.3)
Chloride: 100 mmol/L (ref 98–111)
Creatinine, Ser: 0.66 mg/dL (ref 0.61–1.24)
GFR, Estimated: >60 mL/min (ref >60)
Glucose, Bld: 139 mg/dL — ABNORMAL HIGH (ref 70–99)
Potassium: 4.9 mmol/L (ref 3.5–5.1)
Sodium: 137 mmol/L (ref 135–145)
Total Bilirubin: 0.3 mg/dL (ref 0.3–1.2)
Total Protein: 6.6 g/dL (ref 6.5–8.1)

## 2023-05-18 LAB — CBC
HCT: 27 % — ABNORMAL LOW (ref 39.0–52.0)
Hemoglobin: 8.1 g/dL — ABNORMAL LOW (ref 13.0–17.0)
MCH: 29.5 pg (ref 26.0–34.0)
MCHC: 30 g/dL (ref 30.0–36.0)
MCV: 98.2 fL (ref 80.0–100.0)
Platelets: 616 10*3/uL — ABNORMAL HIGH (ref 150–400)
RBC: 2.75 MIL/uL — ABNORMAL LOW (ref 4.22–5.81)
RDW: 16.9 % — ABNORMAL HIGH (ref 11.5–15.5)
WBC: 25.9 10*3/uL — ABNORMAL HIGH (ref 4.0–10.5)
nRBC: 0 % (ref 0.0–0.2)

## 2023-05-18 LAB — MAGNESIUM: Magnesium: 1.8 mg/dL (ref 1.7–2.4)

## 2023-05-18 LAB — TRIGLYCERIDES: Triglycerides: 86 mg/dL (ref <150)

## 2023-05-18 LAB — PHOSPHORUS: Phosphorus: 3.7 mg/dL (ref 2.5–4.6)

## 2023-05-18 LAB — GLUCOSE, CAPILLARY: Glucose-Capillary: 160 mg/dL — ABNORMAL HIGH (ref 70–99)

## 2023-05-18 MED ORDER — MAGNESIUM SULFATE 4 GM/100ML IV SOLN
4.0000 g | Freq: Once | INTRAVENOUS | Status: AC
  Administered 2023-05-18: 4 g via INTRAVENOUS
  Filled 2023-05-18: qty 100

## 2023-05-18 MED ORDER — HYDROCERIN EX CREA
TOPICAL_CREAM | Freq: Every day | CUTANEOUS | Status: DC
  Filled 2023-05-18: qty 113

## 2023-05-18 MED ORDER — TRACE MINERALS CU-MN-SE-ZN 300-55-60-3000 MCG/ML IV SOLN
INTRAVENOUS | Status: AC
  Filled 2023-05-18: qty 876

## 2023-05-18 MED ORDER — HYDROCODONE-ACETAMINOPHEN 7.5-325 MG/15ML PO SOLN
10.0000 mL | ORAL | Status: DC | PRN
  Administered 2023-05-18 – 2023-05-22 (×14): 10 mL via ORAL
  Filled 2023-05-18 (×14): qty 15

## 2023-05-18 NOTE — Progress Notes (Signed)
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  General surgery consulted, for now attempting conservative management with TPN to improve nutritional status.  He will likely need repair at 1 point.  Initially admitted to the hospitalist service and transferred to the ICU due to worsening respiratory status.  Back to the hospitalist service on 8/18  Significant events: 7/19 admit 7/26 thoracentesis with 800 cc of fluid obtained 7/29 tolerating SBT 7/30 self extubated during SBT. 7/31 reintubated for increased respiratory distress. 8/1 failed SBT, significant agitation  8/2 Failed SBT again this am due to agitation, remains on fentanyl and propofol  8/12 remains on high flow nasal cannula, difficulty weaning, consult to palliative 8/13 still trying to wean... however patient refused PT yesterady   8/14 minimal to no improvement, refusing care, refusing turns, refusing PT  8/16 remains on [facility]  8/17 off HHFNC, now on salter catheter, o2 requirements improving  8/18 transferred to the hospitalist service  Subjective / 24h Interval events: Feels well today, appreciates that he is improving.  Assesement and Plan: Principal Problem:   Hyponatremia Active Problems:   Pancreatic insufficiency   HTN (hypertension)   GERD (gastroesophageal reflux disease)   History of partial pancreatectomy   Chronic pain syndrome   Iron deficiency anemia   Atherosclerosis of native coronary artery of native heart without angina pectoris   Major depressive disorder, recurrent episode, severe (HCC)   GAD (generalized anxiety disorder)   Gastric ulcer without hemorrhage or perforation   Transaminitis   Colitis   UTI (urinary tract  infection)   Acute respiratory failure with hypoxia (HCC)   Principal problem Acute hypoxic respiratory failure, recurrent aspiration pneumonia - Received Ceftriaxone 7/19-7/25, Flagyl 7/19-7/25, Zosyn 7/25-7/30, Vanc 7/26-7/28, and Micafungin 7/26-7/28 -Monitored off antibiotics for now -He has been slow working with PT -Involve TOC today for placement  Active problems Gastrocolic fistula, being manage conservatively for now - Will likely need surgical repair once medically optimized, but overall quite difficult with TPN only.  Will discuss with surgery today  Acute colitis, pancreatic mass status post Whipple's 2015, severe protein calorie malnutrition - General Surgery intermittently following.  Continue TPN. -Surgery is okay with allowing him to eat anything that he would like but they understand that it would increase his risk for ongoing diarrhea due to the fistula   Obstipation - Continue bowel regimen    Hypokalemia, Hypophosphatemia, Hypomagnesemia - Continue to watch electrolytes while on TPN, replete as indicated   Anemia of critical illness - Monitor H&H and transfuse if less than 7   DVT of unknown duration - Continue therapeutic lovenox    Bilateral foot wounds, POA - Continue as needed morphine.  Wound care consult  Scheduled Meds:  Chlorhexidine Gluconate Cloth  6 each Topical Q0600   enoxaparin (LOVENOX) injection  80 mg Subcutaneous Q12H   furosemide  40 mg Intravenous BID   hydrocerin   Topical BID   pantoprazole (PROTONIX) IV  40 mg Intravenous QHS   senna-docusate  1 tablet Oral QHS   sodium chloride flush  10-40 mL Intracatheter Q12H   traZODone  50 mg Oral QHS   Continuous Infusions:  magnesium sulfate bolus IVPB     TPN CYCLIC-ADULT (ION) 95 mL/hr at 05/18/23  0800   TPN CYCLIC-ADULT (ION)     PRN Meds:.acetaminophen **OR** acetaminophen, albuterol, hydrALAZINE, metoCLOPramide (REGLAN) injection, morphine injection, ondansetron (ZOFRAN) IV, mouth rinse,  phenol, sodium chloride flush  Current Outpatient Medications  Medication Instructions   acetaminophen (TYLENOL) 500 mg, Oral, Every 6 hours PRN   dicyclomine (BENTYL) 20 mg, Oral, 2 times daily   lipase/protease/amylase (CREON) 36,000 Units, Oral, 3 times daily with meals   metoCLOPramide (REGLAN) 10 mg, Oral, Every 6 hours PRN   ondansetron (ZOFRAN) 4 mg, Oral, Every 8 hours PRN   ondansetron (ZOFRAN-ODT) 4 mg, Oral, Every 8 hours PRN   pantoprazole (PROTONIX) 40 mg, Oral, 2 times daily   promethazine (PHENERGAN) 25 mg, Oral, Every 6 hours PRN    Diet Orders (From admission, onward)     Start     Ordered   05/04/23 1642  Diet clear liquid Room service appropriate? Yes; Fluid consistency: Thin; Fluid restriction: 1200 mL Fluid  Diet effective now       Comments: Pt only wants juice and ice water. RN to consider diluting juice with water, may help with tolerance.  Question Answer Comment  Room service appropriate? Yes   Fluid consistency: Thin   Fluid restriction: 1200 mL Fluid      05/04/23 1642            DVT prophylaxis: Place TED hose Start: 05/03/23 0804 SCDs Start: 04/17/23 1802   Lab Results  Component Value Date   PLT 616 (H) 05/18/2023      Code Status: Full Code  Family Communication: no family at bedside   Status is: Inpatient Remains inpatient appropriate because: severity of illness  Level of care: Progressive  Consultants:  Surgery GI PCCM Palliative  Objective: Vitals:   05/18/23 0700 05/18/23 0800 05/18/23 0900 05/18/23 1000  BP: 102/76 110/83 120/82 121/81  Pulse: (!) 113 (!) 104 (!) 110 (!) 110  Resp: 19 20 (!) 27 (!) 21  Temp:      TempSrc:      SpO2: 100% 99% 100% 99%  Weight:      Height:        Intake/Output Summary (Last 24 hours) at 05/18/2023 1049 Last data filed at 05/18/2023 0800 Gross per 24 hour  Intake 2559.32 ml  Output 2100 ml  Net 459.32 ml   Wt Readings from Last 3 Encounters:  05/18/23 51.1 kg  03/04/23  54.4 kg  12/11/22 68 kg    Examination:  Constitutional: NAD Eyes: lids and conjunctivae normal, no scleral icterus ENMT: mmm Neck: normal, supple Respiratory: clear to auscultation bilaterally, no wheezing, no crackles.  Cardiovascular: Regular rate and rhythm, no murmurs / rubs / gallops. No LE edema. Abdomen: soft, no distention, no tenderness. Bowel sounds positive.   Data Reviewed: I have independently reviewed following labs and imaging studies   CBC Recent Labs  Lab 05/14/23 0530 05/15/23 0422 05/18/23 0614  WBC 21.4* 25.8* 25.9*  HGB 8.0* 8.0* 8.1*  HCT 25.5* 26.0* 27.0*  PLT 412* 484* 616*  MCV 95.5 95.9 98.2  MCH 30.0 29.5 29.5  MCHC 31.4 30.8 30.0  RDW 17.5* 17.3* 16.9*    Recent Labs  Lab 05/13/23 0529 05/14/23 0530 05/15/23 0422 05/16/23 0754 05/17/23 0355 05/18/23 0613  NA 136 132* 133* 134*  --  137  K 4.1 4.2 4.1 4.6  --  4.9  CL 95* 92* 93* 95*  --  100  CO2 30 31 33* 31  --  29  GLUCOSE 138* 142*  151* 146*  --  139*  BUN 23* 23* 25* 26*  --  27*  CREATININE 0.50* 0.66 0.53* 0.63  --  0.66  CALCIUM 8.6* 8.4* 8.5* 8.9  --  8.8*  AST  --  58*  --   --   --  30  ALT  --  54*  --   --   --  37  ALKPHOS  --  110  --   --   --  127*  BILITOT  --  0.6  --   --   --  0.3  ALBUMIN  --  2.0*  --   --   --  2.1*  MG 1.8 1.8 1.8 1.8 1.8 1.8    ------------------------------------------------------------------------------------------------------------------ Recent Labs    05/18/23 0613  TRIG 86    Lab Results  Component Value Date   HGBA1C 5.1 05/10/2023   ------------------------------------------------------------------------------------------------------------------ No results for input(s): "TSH", "T4TOTAL", "T3FREE", "THYROIDAB" in the last 72 hours.  Invalid input(s): "FREET3"  Cardiac Enzymes No results for input(s): "CKMB", "TROPONINI", "MYOGLOBIN" in the last 168 hours.  Invalid input(s):  "CK" ------------------------------------------------------------------------------------------------------------------    Component Value Date/Time   BNP 571.3 (H) 04/24/2023 0858    CBG: Recent Labs  Lab 05/16/23 0829 05/16/23 2039 05/17/23 0844 05/17/23 1746 05/18/23 0628  GLUCAP 146* 143* 168* 144* 160*    Recent Results (from the past 240 hour(s))  Expectorated Sputum Assessment w Gram Stain, Rflx to Resp Cult     Status: None   Collection Time: 05/10/23  4:33 AM   Specimen: Sputum  Result Value Ref Range Status   Specimen Description SPUTUM  Final   Special Requests NONE  Final   Sputum evaluation   Final    THIS SPECIMEN IS ACCEPTABLE FOR SPUTUM CULTURE Performed at [facility], [address] [place], [place] [address]    Report Status 05/10/2023 FINAL  Final  Culture, Respiratory w Gram Stain     Status: None   Collection Time: 05/10/23  4:33 AM   Specimen: SPU  Result Value Ref Range Status   Specimen Description SPUTUM  Final   Special Requests NONE Reflexed from [id number]  Final   Gram Stain   Final    FEW WBC PRESENT, PREDOMINANTLY PMN FEW BUDDING YEAST SEEN RARE GRAM POSITIVE COCCI    Culture   Final    Normal respiratory flora-no Staph aureus or Pseudomonas seen Performed at [facility], [address], [place], [place] [address]    Report Status 05/12/2023 FINAL  Final     Radiology Studies: No results found.   Pamella Pert, MD, PhD [facility]  Between 7 am - 7 pm I am available, please contact me via Amion (for emergencies) or Securechat (non urgent messages)  Between 7 pm - 7 am I am not available, please contact night coverage MD/APP via Amion

## 2023-05-18 NOTE — Progress Notes (Signed)
   Palliative Medicine Inpatient Follow Up Note   I met at bedside with Brent Rogers this afternoon. He shares that he "needs to get a nap before PT" and does not desire talking about advance directives at this time. He requests I return tomorrow at 1:30PM at which time he shares he will be more willing to review these documents.  I have called patients brother, Brent Rogers to arrange a meeting and am awaiting a call back.  No Charge ______________________________________________________________________________________ Brent Rogers Palliative Medicine Team Team Cell Phone: [phone] Please utilize secure chat with additional questions, if there is no response within 30 minutes please call the above phone number  Palliative Medicine Team providers are available by phone from 7am to 7pm daily and can be reached through the team cell phone.  Should this patient require assistance outside of these hours, please call the patient's attending physician.

## 2023-05-18 NOTE — Progress Notes (Signed)
PHARMACY - TOTAL PARENTERAL NUTRITION CONSULT NOTE  Indication: Gastrocolic fistula   Patient Measurements: Height: 6\' 3"  (190.5 cm) Weight: 51.1 kg (112 lb 10.5 oz) IBW/kg (Calculated) : 84.5 TPN AdjBW (KG): 53.2 Body mass index is 14.08 kg/m. 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  Patient presented on 7/19 with N/V x 4 days along with LLQ pain.  CT showed thickening of the distal transverse colon with complex fistulous connection between the transverse colon to the gastrojejunal anastomosis and adjacent small bowel loops.  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  Pharmacy consulted to manage TPN.  Patient was started on CLD on 7/22 and reported fullness/abdominal pain after eating.  He reports not eating for a week PTA.  He does not weigh himself, but he knows he has lost a lot of weight; his pants size reduced from a 36 to a 28 over a 6 month time frame.  He typically eats 2 meals a day (lunch and dinner) and he eats protein, vegetables and carb; he rarely eats fruits.   Glucose / Insulin: no hx DM . BG <180, off SSI 7/31 - significant hypoglycemia on cyclic TPN   8/1 - insulin removed from TPN and BG stabilized Electrolytes:  last labs 8/19- Na 137, K 4.9, Cl 100, Mg still 1.8 (max in TPN; received 2-4g IV daily since 8/8), CoCa 10.32, others WNL Renal: SCr 0.66, BUN 27, furosemide IV 40 BID since 8/12  Hepatic: from 8/19: AST/ALT 30/37 down, alk phos/tbili/TG wnl, albumin 2.1 Intake / Output; MIVF: on 5 L O2 HFNC (sating 99%) with small layering pleural effusions; UOP 2.3 ml/kg/hr, LBM 8/17, net neg 4.5L GI Imaging:  7/25 CT: severe fatty liver, thickened appearance of colon GI Surgeries / Procedures: none since TPN initiation  Central access: PICC placed 04/21/23 TPN start date: 04/22/23  Nutritional Goals: Goal concentrated  TPN rate is 75 mL/hr (provides 131g AA and 2232 kCal per day)    8/1 RD Estimated Needs Total Energy Estimated Needs: 2200-2400 kcals Total Protein Estimated Needs: 120-135 g Total Fluid Estimated Needs: 2L  Current Nutrition:  Clear liquids (for pleasure, poor absorption expected) - eating very little **Patient refusing some care TPN  Plan:  Cycle concentrated TPN over 20 hr (rate 47-95 ml/hr, GIR 2.4-48 mg/kg/min), meeting 100% of estimated needs Electrolytes in TPN: Na 150 mEq/L, decrease K 60 mEq/L (= 108 mEq/bag), Ca 2 mEq/L, max Mag 15 mEq/L (= ~3 g/day), Phos 20 mmol/L, continue maximized chloride- re-evaluate 8/20 labs Add daily multivitamin and trace elements in TPN Give Magnesium IVPB 4g x1 today  Continue BG checks to q12 hr Monitor diuresis plan (reduced 8/12)    Standard TPN labs every Mon/Thurs and prn Per surgery, plan for GJ-colonic fistula repair when nutrition status improved  Cycle to 16 hours if tolerating, watch O2 requirement- remain unchanged x 4 days  Brent Rogers BS, PharmD, BCPS Clinical Pharmacist 05/18/2023 10:40 AM  Contact: [phone] after 3 PM  "Be curious, not judgmental..." -Debbora Dus

## 2023-05-18 NOTE — Plan of Care (Signed)

## 2023-05-18 NOTE — Consult Note (Signed)
WOC Nurse wound follow up Wound type:follow up on bilateral buttock DTPI. Measurement: 2cm x 2cm on each of the buttocks, depth is 0.1cm Wound OZH:YQMV, dry Drainage (amount, consistency, odor) None Periwound: intact Dressing procedure/placement/frequency: Turning and repositioning is in place and time in the supine position is being limited. Note is an excellent response to xeroform dressings and silicone foam dressing placement to the affected area. Reepithelialization noted to be in progress.  Asked to assess bilateral heels, they are currently free of pressure injury. I have added application of Eucerin cream to bilateral feet but not between digits.  WOC nursing team will follow, seeing every 7-10 days and will remain available to this patient, the nursing and medical teams.    Thank you for inviting [place] to participate in this patient's Plan of Care.  Ladona Mow, MSN, RN, CNS, GNP, Leda Min, [employer], [employer] phone:  [phone]

## 2023-05-18 NOTE — Progress Notes (Signed)
Nutrition Follow-up  DOCUMENTATION CODES:   Severe malnutrition in context of chronic illness, Underweight  INTERVENTION:  Continue TPN per pharmacy to meet 100% estimated needs. -Currently cycled over 20 hours.  Plan is to continue clear liquids at this time, for comfort only.  NUTRITION DIAGNOSIS:   Severe Malnutrition related to chronic illness as evidenced by severe fat depletion, severe muscle depletion.  Ongoing - addressing with TPN.  GOAL:   Patient will meet greater than or equal to 90% of their needs  Met with TPN.  MONITOR:   Vent status, Labs, Weight trends, Skin, I & O's, Other (Comment) (TPN)  REASON FOR ASSESSMENT:   Consult New TPN/TNA  ASSESSMENT:   57 y.o. 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 Patient found to have  fistulous connections to GJ junction and small bowel  8/5: clear liquid diet added for pleasure only 8/14: calorie and protein needs increased 8/15: TPN adjusted to meet estimated needs 8/18: TPN cycled to 20 hours  Met with pt at bedside. He is currently on HFNC 5 L/min at time of RD assessment. Pt reports he is drinking small amounts of water and apple juice. Pt reports he wishes he could have soup and a sandwich. There was discussion last week of discussing possible diet advancement with general surgery. Noted pt previously documented to have bilateral foot wounds on soles. Per WOC RN note today bilateral feet are currently free of pressure injury.  Admission wt 53.2 kg. Weights have fluctuated this admission (suspect related to fluid status). Pt was 55.6 kg on 8/16. Suspect current wt today of 51.1 kg may be related to measurement on a different scale. Recommend continuing to trend weights.  IV Access: right brachial PICC double  lumen placed 7/23  TPN Order (evening 8/19): Access: central Dosing weight: 51.1 kg Amino acids: 131.4 grams Dextrose: 306 grams GIR: 2.61-5.27 mg/kg/min SMOF Lipid: 66.6 grams Additives: MVI adult 10 mL, trace elements 1 mL, chromium chloride 10 micrograms Volume: 1800 mL Schedule: 47 mL/hour x 1 hour, 95 mL/hour x 18 hours, 47 mL/hour x 1 hour Provides: 2232 kcal, 131.4 grams protein Macronutrient Distribution: 24% kcal from protein, 46% kcal from dextrose, 30% kcal from lipids  Medications reviewed and include: Lasix 40 mg BID, pantoprazole, senna-docusate 1 tablet at bedtime  Labs reviewed: CBG 144-168, BUN 27, Triglycerides 86  UOP: 2850 mL UOP (2.3 mL/kg/hr)  I/O: -4632.8 mL since 05/04/23  Discussed with RN. Discussed with MD via secure chat. Plan is to stay on clear liquid diet at this time. MD reaching out to surgery to see if plan would be for surgical intervention while here.  Diet Order:   Diet Order             Diet clear liquid Room service appropriate? Yes; Fluid consistency: Thin; Fluid restriction: 1200 mL Fluid  Diet effective now                  EDUCATION NEEDS:   Not appropriate for education at this time  Skin:  Skin Assessment: Skin Integrity Issues: Skin Integrity Issues:: DTI DTI: bilateral buttocks Other: N/A  Last BM:  05/16/23 per chart  Height:   Ht Readings from Last 1 Encounters:  04/18/23 6\' 3"  (1.905 m)   Weight:   Wt Readings from Last 1 Encounters:  05/18/23 51.1 kg   BMI:  Body mass index is 14.08 kg/m.  Estimated Nutritional Needs:   Kcal:  2200-2400 kcals  Protein:  120-135 g  Fluid:  2L  Jazzlin Clements Tollie Eth, MS, RD, LDN, CNSC Pager number available on Amion

## 2023-05-18 NOTE — Plan of Care (Signed)
  Problem: Education: Goal: Knowledge of General Education information will improve Description Including pain rating scale, medication(s)/side effects and non-pharmacologic comfort measures Outcome: Progressing   Problem: Health Behavior/Discharge Planning: Goal: Ability to manage health-related needs will improve Outcome: Progressing   Problem: Clinical Measurements: Goal: Ability to maintain clinical measurements within normal limits will improve Outcome: Progressing Goal: Will remain free from infection Outcome: Progressing Goal: Diagnostic test results will improve Outcome: Progressing Goal: Respiratory complications will improve Outcome: Progressing Goal: Cardiovascular complication will be avoided Outcome: Progressing   Problem: Activity: Goal: Risk for activity intolerance will decrease Outcome: Progressing   Problem: Nutrition: Goal: Adequate nutrition will be maintained Outcome: Progressing   Problem: Coping: Goal: Level of anxiety will decrease Outcome: Progressing   Problem: Pain Managment: Goal: General experience of comfort will improve Outcome: Progressing   Problem: Skin Integrity: Goal: Risk for impaired skin integrity will decrease Outcome: Progressing   

## 2023-05-18 NOTE — Progress Notes (Signed)
OT Cancellation Note  Patient Details Name: Brent Rogers MRN: [id number] DOB: 08-26-1966   Cancelled Treatment:    Reason Eval/Treat Not Completed: Patient declined, no reason specified (attempted at 1320 and pt stated to come back in 1 hour, attempted again at 1424 and pt declined OOB or bed level activity. Will follow up next date as schedule permits)  Carver Fila, OTD, OTR/L SecureChat [facility] [phone]   Dalphine Handing 05/18/2023, 2:27 PM

## 2023-05-19 DIAGNOSIS — E871 Hypo-osmolality and hyponatremia: Secondary | ICD-10-CM | POA: Diagnosis not present

## 2023-05-19 DIAGNOSIS — Z515 Encounter for palliative care: Secondary | ICD-10-CM | POA: Diagnosis not present

## 2023-05-19 DIAGNOSIS — Z7189 Other specified counseling: Secondary | ICD-10-CM | POA: Diagnosis not present

## 2023-05-19 LAB — COMPREHENSIVE METABOLIC PANEL
ALT: 37 U/L (ref 0–44)
AST: 31 U/L (ref 15–41)
Albumin: 1.9 g/dL — ABNORMAL LOW (ref 3.5–5.0)
Alkaline Phosphatase: 121 U/L (ref 38–126)
Anion gap: 5 (ref 5–15)
BUN: 35 mg/dL — ABNORMAL HIGH (ref 6–20)
CO2: 31 mmol/L (ref 22–32)
Calcium: 8.9 mg/dL (ref 8.9–10.3)
Chloride: 101 mmol/L (ref 98–111)
Creatinine, Ser: 0.45 mg/dL — ABNORMAL LOW (ref 0.61–1.24)
GFR, Estimated: >60 mL/min (ref >60)
Glucose, Bld: 144 mg/dL — ABNORMAL HIGH (ref 70–99)
Potassium: 5.2 mmol/L — ABNORMAL HIGH (ref 3.5–5.1)
Sodium: 137 mmol/L (ref 135–145)
Total Bilirubin: 0.2 mg/dL — ABNORMAL LOW (ref 0.3–1.2)
Total Protein: 6.6 g/dL (ref 6.5–8.1)

## 2023-05-19 LAB — GLUCOSE, CAPILLARY: Glucose-Capillary: 169 mg/dL — ABNORMAL HIGH (ref 70–99)

## 2023-05-19 MED ORDER — TRACE MINERALS CU-MN-SE-ZN 300-55-60-3000 MCG/ML IV SOLN
INTRAVENOUS | Status: AC
  Filled 2023-05-19: qty 876

## 2023-05-19 NOTE — Progress Notes (Signed)
   Palliative Medicine Inpatient Follow Up Note HPI: 57 y.o. 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 Conservative management and TPN to try to help nutritional status.   Today's Discussion 05/19/2023  *Please note that this is a verbal dictation therefore any spelling or grammatical errors are due to the "Dragon Medical One" system interpretation.  Chart reviewed inclusive of vital signs, progress notes, laboratory results, and diagnostic images.   I met with Brent Rogers's RN this afternoon who shares that Brent Rogers continues to refute mobility efforts.  I met at bedside with Brent Rogers. I shared the plan for completion of advanced directives this afternoon with our chaplain. He at first provided a bewildered look then vocalized understanding.   I spoke to Brent Rogers brother, Brent Rogers. I shared the concerns in the setting of Brent Rogers's lack of adherence to what is being requested by the medical staff. Brent Rogers states he thought we had worked through that as he and his brother came late last week to provide encouragement. We reviewed further that Brent Rogers is not improving or thriving. I shared that Brent Rogers is very likely to decline further given his present state. Brent Rogers states understanding. Brent Rogers shares he cannot meet with the PMT as he gets off work at [employer] and cannot often get here until [employer].    Questions and concerns addressed/Palliative Support Provided.   Objective Assessment: Vital Signs Vitals:   05/19/23 0019 05/19/23 0400  BP: 111/77 111/75  Pulse: (!) 104 100  Resp: 20 17  Temp: 97.6 F (36.4 C) 97.7 F (36.5 C)  SpO2: 97% 100%    Intake/Output Summary (Last 24 hours) at 05/19/2023 1412 Last data filed at 05/19/2023 1129 Gross per 24 hour  Intake 950.84 ml  Output  2950 ml  Net -1999.16 ml   Last Weight  Most recent update: 05/19/2023  6:20 AM    Weight  51.9 kg (114 lb 6.7 oz)            Gen:  Middle Aged AA M in NAD HEENT: Dry mucous membranes CV: Irregular rate and rhythm  PULM: On 6LPM Lemay, breathing is even and nonlabored ABD: soft/nontender  EXT: Cachectic Neuro: Alert and oriented x3   SUMMARY OF RECOMMENDATIONS   Full Code/ Full Scope of Care  Patient desires morning activities (labs, vitals, cleaning) be modified to later in the morning  Patient willing to complete Advance Directives --> have requested chaplain involvement  Goals at this time are to see if improvements can be made --> patient aware that if at any time he desire stopping aggressive measures that this could be pursued through comfort focused care  Ongoing PMT support  Billing based on MDM: Moderate  ______________________________________________________________________________________ Brent Rogers Palliative Medicine Team Team Cell Phone: [phone] Please utilize secure chat with additional questions, if there is no response within 30 minutes please call the above phone number  Palliative Medicine Team providers are available by phone from 7am to 7pm daily and can be reached through the team cell phone.  Should this patient require assistance outside of these hours, please call the patient's attending physician.

## 2023-05-19 NOTE — Progress Notes (Signed)
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  General surgery consulted, for now attempting conservative management with TPN to improve nutritional status.  He will likely need repair at 1 point.  Initially admitted to the hospitalist service and transferred to the ICU due to worsening respiratory status.  Back to the hospitalist service on 8/18  Significant events: 7/19 admit 7/26 thoracentesis with 800 cc of fluid obtained 7/29 tolerating SBT 7/30 self extubated during SBT. 7/31 reintubated for increased respiratory distress. 8/1 failed SBT, significant agitation  8/2 Failed SBT again this am due to agitation, remains on fentanyl and propofol  8/12 remains on high flow nasal cannula, difficulty weaning, consult to palliative 8/13 still trying to wean... however patient refused PT yesterady   8/14 minimal to no improvement, refusing care, refusing turns, refusing PT  8/16 remains on [facility]  8/17 off HHFNC, now on salter catheter, o2 requirements improving  8/18 transferred to the hospitalist service  Subjective / 24h Interval events: Feels weak, tells me he does not want to work with physical/occupational therapy due to weakness.  Long discussion with the patient at bedside that his prognosis is poor if he is continues to refuse any form of physical activity.  He understands but still does not want to get out of bed  Assesement and Plan: Principal Problem:   Hyponatremia Active Problems:   Pancreatic insufficiency   HTN (hypertension)   GERD (gastroesophageal reflux disease)   History of partial pancreatectomy   Chronic pain syndrome   Iron deficiency anemia   Atherosclerosis of native coronary artery of native  heart without angina pectoris   Major depressive disorder, recurrent episode, severe (HCC)   GAD (generalized anxiety disorder)   Gastric ulcer without hemorrhage or perforation   Transaminitis   Colitis   UTI (urinary tract infection)   Acute respiratory failure with hypoxia (HCC)   Principal problem Acute hypoxic respiratory failure, recurrent aspiration pneumonia - Received Ceftriaxone 7/19-7/25, Flagyl 7/19-7/25, Zosyn 7/25-7/30, Vanc 7/26-7/28, and Micafungin 7/26-7/28 -Continue to monitor off antibiotics. -Unfortunately he is refusing to work with PT  Active problems Gastrocolic fistula, being manage conservatively for now - Will likely need surgical repair once medically optimized, but overall quite difficult with TPN only.  Awaiting surgery's input about timing for the surgery, but doubt with his refusal to be involved in and if physical activity he is a surgical candidate at this point  Acute colitis, pancreatic mass status post Whipple's 2015, severe protein calorie malnutrition - General Surgery intermittently following.  Continue TPN. -Continue clear liquids   Obstipation - Continue bowel regimen    Hypokalemia, Hypophosphatemia, Hypomagnesemia - Continue to watch electrolytes while on TPN, replete as indicated   Anemia of critical illness - Monitor H&H and transfuse if less than 7   DVT of unknown duration - Continue therapeutic lovenox    Bilateral foot wounds, POA - Continue as needed morphine.  Wound care consult  Scheduled Meds:  Chlorhexidine Gluconate Cloth  6 each Topical Q0600   enoxaparin (LOVENOX) injection  80 mg Subcutaneous Q12H   furosemide  40 mg Intravenous BID   hydrocerin   Topical Daily   pantoprazole (PROTONIX) IV  40 mg Intravenous QHS   senna-docusate  1 tablet Oral  QHS   sodium chloride flush  10-40 mL Intracatheter Q12H   traZODone  50 mg Oral QHS   Continuous Infusions:  TPN CYCLIC-ADULT (ION) 95 mL/hr at 05/18/23 1855   TPN  CYCLIC-ADULT (ION)     PRN Meds:.acetaminophen **OR** acetaminophen, albuterol, hydrALAZINE, HYDROcodone-acetaminophen, metoCLOPramide (REGLAN) injection, morphine injection, ondansetron (ZOFRAN) IV, mouth rinse, phenol, sodium chloride flush  Current Outpatient Medications  Medication Instructions   acetaminophen (TYLENOL) 500 mg, Oral, Every 6 hours PRN   dicyclomine (BENTYL) 20 mg, Oral, 2 times daily   lipase/protease/amylase (CREON) 36,000 Units, Oral, 3 times daily with meals   metoCLOPramide (REGLAN) 10 mg, Oral, Every 6 hours PRN   ondansetron (ZOFRAN) 4 mg, Oral, Every 8 hours PRN   ondansetron (ZOFRAN-ODT) 4 mg, Oral, Every 8 hours PRN   pantoprazole (PROTONIX) 40 mg, Oral, 2 times daily   promethazine (PHENERGAN) 25 mg, Oral, Every 6 hours PRN    Diet Orders (From admission, onward)     Start     Ordered   05/04/23 1642  Diet clear liquid Room service appropriate? Yes; Fluid consistency: Thin; Fluid restriction: 1200 mL Fluid  Diet effective now       Comments: Pt only wants juice and ice water. RN to consider diluting juice with water, may help with tolerance.  Question Answer Comment  Room service appropriate? Yes   Fluid consistency: Thin   Fluid restriction: 1200 mL Fluid      05/04/23 1642            DVT prophylaxis: Place TED hose Start: 05/03/23 0804 SCDs Start: 04/17/23 1802   Lab Results  Component Value Date   PLT 616 (H) 05/18/2023      Code Status: Full Code  Family Communication: no family at bedside   Status is: Inpatient Remains inpatient appropriate because: severity of illness  Level of care: Progressive  Consultants:  Surgery GI PCCM Palliative  Objective: Vitals:   05/18/23 2000 05/19/23 0019 05/19/23 0400 05/19/23 0620  BP: 114/81 111/77 111/75   Pulse: (!) 105 (!) 104 100   Resp: 20 20 17    Temp: 98 F (36.7 C) 97.6 F (36.4 C) 97.7 F (36.5 C)   TempSrc: Oral Oral Oral   SpO2: 99% 97% 100%   Weight:    51.9 kg   Height:        Intake/Output Summary (Last 24 hours) at 05/19/2023 1017 Last data filed at 05/19/2023 0500 Gross per 24 hour  Intake 1192.83 ml  Output 1950 ml  Net -757.17 ml   Wt Readings from Last 3 Encounters:  05/19/23 51.9 kg  03/04/23 54.4 kg  12/11/22 68 kg    Examination:  Constitutional: NAD Eyes: lids and conjunctivae normal, no scleral icterus ENMT: mmm Neck: normal, supple Respiratory: clear to auscultation bilaterally, no wheezing, no crackles.  Cardiovascular: Regular rate and rhythm, no murmurs / rubs / gallops. No LE edema. Abdomen: soft, no distention, no tenderness. Bowel sounds positive.   Data Reviewed: I have independently reviewed following labs and imaging studies   CBC Recent Labs  Lab 05/14/23 0530 05/15/23 0422 05/18/23 0614  WBC 21.4* 25.8* 25.9*  HGB 8.0* 8.0* 8.1*  HCT 25.5* 26.0* 27.0*  PLT 412* 484* 616*  MCV 95.5 95.9 98.2  MCH 30.0 29.5 29.5  MCHC 31.4 30.8 30.0  RDW 17.5* 17.3* 16.9*    Recent Labs  Lab 05/14/23 0530 05/15/23 0422 05/16/23 0754 05/17/23 0355 05/18/23 0613 05/19/23 0500  NA 132* 133* 134*  --  137 137  K 4.2 4.1 4.6  --  4.9 5.2*  CL 92* 93* 95*  --  100 101  CO2 31 33* 31  --  29 31  GLUCOSE 142* 151* 146*  --  139* 144*  BUN 23* 25* 26*  --  27* 35*  CREATININE 0.66 0.53* 0.63  --  0.66 0.45*  CALCIUM 8.4* 8.5* 8.9  --  8.8* 8.9  AST 58*  --   --   --  30 31  ALT 54*  --   --   --  37 37  ALKPHOS 110  --   --   --  127* 121  BILITOT 0.6  --   --   --  0.3 0.2*  ALBUMIN 2.0*  --   --   --  2.1* 1.9*  MG 1.8 1.8 1.8 1.8 1.8  --     ------------------------------------------------------------------------------------------------------------------ Recent Labs    05/18/23 0613  TRIG 86    Lab Results  Component Value Date   HGBA1C 5.1 05/10/2023   ------------------------------------------------------------------------------------------------------------------ No results for input(s): "TSH",  "T4TOTAL", "T3FREE", "THYROIDAB" in the last 72 hours.  Invalid input(s): "FREET3"  Cardiac Enzymes No results for input(s): "CKMB", "TROPONINI", "MYOGLOBIN" in the last 168 hours.  Invalid input(s): "CK" ------------------------------------------------------------------------------------------------------------------    Component Value Date/Time   BNP 571.3 (H) 04/24/2023 0858    CBG: Recent Labs  Lab 05/16/23 2039 05/17/23 0844 05/17/23 1746 05/18/23 0628 05/19/23 0614  GLUCAP 143* 168* 144* 160* 169*    Recent Results (from the past 240 hour(s))  Expectorated Sputum Assessment w Gram Stain, Rflx to Resp Cult     Status: None   Collection Time: 05/10/23  4:33 AM   Specimen: Sputum  Result Value Ref Range Status   Specimen Description SPUTUM  Final   Special Requests NONE  Final   Sputum evaluation   Final    THIS SPECIMEN IS ACCEPTABLE FOR SPUTUM CULTURE Performed at [facility], [address] [place], [place] [address]    Report Status 05/10/2023 FINAL  Final  Culture, Respiratory w Gram Stain     Status: None   Collection Time: 05/10/23  4:33 AM   Specimen: SPU  Result Value Ref Range Status   Specimen Description SPUTUM  Final   Special Requests NONE Reflexed from [id number]  Final   Gram Stain   Final    FEW WBC PRESENT, PREDOMINANTLY PMN FEW BUDDING YEAST SEEN RARE GRAM POSITIVE COCCI    Culture   Final    Normal respiratory flora-no Staph aureus or Pseudomonas seen Performed at [facility], [address], [place], [place] [address]    Report Status 05/12/2023 FINAL  Final     Radiology Studies: No results found.   Pamella Pert, MD, PhD [facility]  Between 7 am - 7 pm I am available, please contact me via Amion (for emergencies) or Securechat (non urgent messages)  Between 7 pm - 7 am I am not available, please contact night coverage MD/APP via Amion

## 2023-05-19 NOTE — Progress Notes (Signed)
Physical Therapy Treatment Patient Details Name: Brent Rogers MRN: [id number] DOB: [birth year] Today's Date: 05/19/2023   History of Present Illness Pt is a 57 y.o. 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 Conservative management with TPN with potential for eventual surgery. Rapid response called 7/26, on BiPAP and s/p thoracentesis 7/26. Intubated 7/27 and self-extubated 7/31, reintubated 7/31-8/3. Pt continues to require HHFNC or HFNC. PMH includes HTN, CAD, GERD, PUD, s/p cholecystectomy, partial pancreatectomy, anxiety, depression, chronic pain, scoliosis, ETOH abuse.    PT Comments  Patient continues with limited participation this session.  Declined OOB to chair despite encouragement reports palliative practitioner to come for HCPOA paperwork at 1pm, encouraged OOB to complete paperwork, but he continued to decline.  Did readjust with assistance up in bed to allow chair position in bed and completed LE therex.  HR up to 110's with activity and SpO2 maintained in 90's on 6L O2.  PT will continue to follow.  Feel with decreased O2 requirements now no longer appropriate for LTACH so recommending inpatient rehab < 3 hours/day.     If plan is discharge home, recommend the following: A lot of help with walking and/or transfers;A lot of help with bathing/dressing/bathroom;Assist for transportation;Help with stairs or ramp for entrance;Assistance with cooking/housework;Direct supervision/assist for medications management   Can travel by private vehicle     No  Equipment Recommendations  Other (comment) (TBA)    Recommendations for Other Services       Precautions / Restrictions Precautions Precautions: Fall Precaution Comments: 5-6L [place] O2     Mobility  Bed Mobility Overal bed mobility: Needs Assistance             General bed mobility comments: scooting up in bed using railing and with knees flexed  with max cues and min A    Transfers                   General transfer comment: declined OOB agreed to chair position    Ambulation/Gait                   Stairs             Wheelchair Mobility     Tilt Bed    Modified Rankin (Stroke Patients Only)       Balance                                            Cognition Arousal: Alert Behavior During Therapy: Flat affect Overall Cognitive Status: No family/caregiver present to determine baseline cognitive functioning                                 General Comments: needs encouragement to participate though reports to palliative practioner he does not want to be confined to the bed        Exercises General Exercises - Lower Extremity Short Arc Quad: AROM, Both, Strengthening, 10 reps, Seated Hip ABduction/ADduction: Strengthening, 10 reps, Both, Seated (isometric hip adductor squeezes w/ 5 sec hold) Other Exercises Other Exercises: applied red theraband back to bed rails and pt encouraged to perform UE therex on his own    General Comments General comments (skin integrity, edema, etc.): on 6L O2 VSS HR into 110's and SpO2  90's      Pertinent Vitals/Pain Pain Assessment Pain Assessment: Faces Faces Pain Scale: Hurts little more Pain Location: generalized with mobility Pain Descriptors / Indicators: Grimacing Pain Intervention(s): Repositioned, Monitored during session    Home Living                          Prior Function            PT Goals (current goals can now be found in the care plan section) Progress towards PT goals: Not progressing toward goals - comment    Frequency    Min 1X/week      PT Plan      Co-evaluation              AM-PAC PT "6 Clicks" Mobility   Outcome Measure  Help needed turning from your back to your side while in a flat bed without using bedrails?: A Lot Help needed moving from lying on your back  to sitting on the side of a flat bed without using bedrails?: A Lot Help needed moving to and from a bed to a chair (including a wheelchair)?: Total Help needed standing up from a chair using your arms (e.g., wheelchair or bedside chair)?: Total Help needed to walk in hospital room?: Total Help needed climbing 3-5 steps with a railing? : Total 6 Click Score: 8    End of Session Equipment Utilized During Treatment: Oxygen Activity Tolerance: Patient limited by fatigue Patient left: in bed;with call bell/phone within reach   PT Visit Diagnosis: Muscle weakness (generalized) (M62.81);Other abnormalities of gait and mobility (R26.89)     Time: 1610-9604 PT Time Calculation (min) (ACUTE ONLY): 25 min  Charges:    $Therapeutic Exercise: 8-22 mins $Therapeutic Activity: 8-22 mins PT General Charges $$ ACUTE PT VISIT: 1 Visit                     Brent Rogers, PT Acute Rehabilitation Services Office:[phone] 05/19/2023    Brent Rogers 05/19/2023, 1:11 PM

## 2023-05-19 NOTE — Social Work (Addendum)
TOC following patient for disposition needs. Pt with barriers to placement including TPN and non compliance. SNF facilities do not take pt's on TPN, and LTAC would require for pt to participate. Palliative care and medical team are following pt and TOC will be available for any further needs.

## 2023-05-19 NOTE — Progress Notes (Signed)
PHARMACY - TOTAL PARENTERAL NUTRITION CONSULT NOTE  Indication: Gastrocolic fistula   Patient Measurements: Height: 6\' 3"  (190.5 cm) Weight: 51.9 kg (114 lb 6.7 oz) IBW/kg (Calculated) : 84.5 TPN AdjBW (KG): 53.2 Body mass index is 14.3 kg/m. 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  Patient presented on 7/19 with N/V x 4 days along with LLQ pain.  CT showed thickening of the distal transverse colon with complex fistulous connection between the transverse colon to the gastrojejunal anastomosis and adjacent small bowel loops.  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  Pharmacy consulted to manage TPN.  Patient was started on CLD on 7/22 and reported fullness/abdominal pain after eating.  He reports not eating for a week PTA.  He does not weigh himself, but he knows he has lost a lot of weight; his pants size reduced from a 36 to a 28 over a 6 month time frame.  He typically eats 2 meals a day (lunch and dinner) and he eats protein, vegetables and carb; he rarely eats fruits.   Glucose / Insulin: no hx DM . BG <180, off SSI 7/31 - significant hypoglycemia on cyclic TPN   8/1 - insulin removed from TPN and BG stabilized Electrolytes: Na 137, K 5.2, Mg still 1.8 (max in TPN; received 2-4g IV daily since 8/8), CoCa 10.58, others WNL Renal: SCr 0.45, BUN 35, furosemide IV 40 BID since 8/12  Hepatic: LFTs/TG wnl, albumin 1.9 Intake / Output; MIVF: O2 5-6L per RN; UOP 2.2 ml/kg/hr, LBM 8/17, net neg 5.6L GI Imaging:  7/25 CT: severe fatty liver, thickened appearance of colon GI Surgeries / Procedures: none since TPN initiation  Central access: PICC placed 04/21/23 TPN start date: 04/22/23  Nutritional Goals: Goal concentrated TPN rate is 75 mL/hr (provides 131g AA and 2232 kCal per day)    8/1 RD Estimated Needs Total Energy Estimated  Needs: 2200-2400 kcals Total Protein Estimated Needs: 120-135 g Total Fluid Estimated Needs: 2L  Current Nutrition:  Clear liquids (for pleasure, poor absorption expected) - eating very little **Patient refusing some care TPN  Plan:  Cycle concentrated TPN over 16 hr (rate 60-120 ml/hr, GIR 3.28- 6.55 mg/kg/min), meeting 100% of estimated needs *note cannot cycle down further due to GIR* Electrolytes in TPN: Na 150 mEq/L, decrease K 20 mEq/L, decrease Ca 0 mEq/L, max Mag 15 mEq/L (= ~3 g/day), Phos 20 mmol/L, AO:ZHYQ max Cl Add daily multivitamin and trace elements in TPN  Continue BG checks to q12 hr Monitor diuresis plan (reduced 8/12)    Standard TPN labs every Mon/Thurs and prn Per surgery, plan for GJ-colonic fistula repair when nutrition status improved    Alphia Moh, PharmD, BCPS, [facility] Clinical Pharmacist  Please check AMION for all [facility] phone numbers After 10:00 PM, call Main Pharmacy [phone]

## 2023-05-19 NOTE — Progress Notes (Signed)
This chaplain responded to PMT NP-Michelle's consult for creating/updating the Pt. Advance Directive.  The Pt. completed AD education and answered the chaplain's clarifying questions. The Pt. chooses Brent Rogers as the healthcare agent. If the healthcare agent is unwilling or unable to serve in this role the Pt. next choice is [name]. The Pt. is not completing a Living Will. The Pt. expresses his desire to talk to [name] about life prolonging measures in a life ending situation.  The chaplain gave the Pt. the original AD along with two copies. The chaplain scanned the Pt. AD into the Pt. EMR.  This chaplain is available for F/U spiritual care as needed.  Chaplain Stephanie Acre [phone]

## 2023-05-20 DIAGNOSIS — E871 Hypo-osmolality and hyponatremia: Secondary | ICD-10-CM | POA: Diagnosis not present

## 2023-05-20 DIAGNOSIS — Z515 Encounter for palliative care: Secondary | ICD-10-CM | POA: Diagnosis not present

## 2023-05-20 DIAGNOSIS — F325 Major depressive disorder, single episode, in full remission: Secondary | ICD-10-CM | POA: Diagnosis not present

## 2023-05-20 DIAGNOSIS — Z7189 Other specified counseling: Secondary | ICD-10-CM | POA: Diagnosis not present

## 2023-05-20 LAB — COMPREHENSIVE METABOLIC PANEL
ALT: 32 U/L (ref 0–44)
AST: 26 U/L (ref 15–41)
Albumin: 1.9 g/dL — ABNORMAL LOW (ref 3.5–5.0)
Alkaline Phosphatase: 123 U/L (ref 38–126)
Anion gap: 6 (ref 5–15)
BUN: 33 mg/dL — ABNORMAL HIGH (ref 6–20)
CO2: 29 mmol/L (ref 22–32)
Calcium: 8.6 mg/dL — ABNORMAL LOW (ref 8.9–10.3)
Chloride: 100 mmol/L (ref 98–111)
Creatinine, Ser: 0.41 mg/dL — ABNORMAL LOW (ref 0.61–1.24)
GFR, Estimated: >60 mL/min (ref >60)
Glucose, Bld: 154 mg/dL — ABNORMAL HIGH (ref 70–99)
Potassium: 4.6 mmol/L (ref 3.5–5.1)
Sodium: 135 mmol/L (ref 135–145)
Total Bilirubin: 0.3 mg/dL (ref 0.3–1.2)
Total Protein: 6.2 g/dL — ABNORMAL LOW (ref 6.5–8.1)

## 2023-05-20 LAB — CBC
HCT: 26.3 % — ABNORMAL LOW (ref 39.0–52.0)
Hemoglobin: 7.8 g/dL — ABNORMAL LOW (ref 13.0–17.0)
MCH: 29.1 pg (ref 26.0–34.0)
MCHC: 29.7 g/dL — ABNORMAL LOW (ref 30.0–36.0)
MCV: 98.1 fL (ref 80.0–100.0)
Platelets: 574 10*3/uL — ABNORMAL HIGH (ref 150–400)
RBC: 2.68 MIL/uL — ABNORMAL LOW (ref 4.22–5.81)
RDW: 16.8 % — ABNORMAL HIGH (ref 11.5–15.5)
WBC: 23.1 10*3/uL — ABNORMAL HIGH (ref 4.0–10.5)
nRBC: 0 % (ref 0.0–0.2)

## 2023-05-20 LAB — MAGNESIUM: Magnesium: 1.7 mg/dL (ref 1.7–2.4)

## 2023-05-20 LAB — GLUCOSE, CAPILLARY
Glucose-Capillary: 165 mg/dL — ABNORMAL HIGH (ref 70–99)
Glucose-Capillary: 109 mg/dL — ABNORMAL HIGH (ref 70–99)

## 2023-05-20 MED ORDER — MIRTAZAPINE 15 MG PO TBDP
15.0000 mg | ORAL_TABLET | Freq: Every day | ORAL | Status: DC
  Administered 2023-05-20 – 2023-05-21 (×2): 15 mg via ORAL
  Filled 2023-05-20 (×3): qty 1

## 2023-05-20 MED ORDER — SILVER NITRATE-POT NITRATE 75-25 % EX MISC: 10.0000 | CUTANEOUS | Status: DC | PRN

## 2023-05-20 MED ORDER — TRACE MINERALS CU-MN-SE-ZN 300-55-60-3000 MCG/ML IV SOLN
INTRAVENOUS | Status: AC
  Filled 2023-05-20: qty 876

## 2023-05-20 MED ORDER — MAGNESIUM SULFATE 4 GM/100ML IV SOLN
4.0000 g | Freq: Once | INTRAVENOUS | Status: AC
  Administered 2023-05-20: 4 g via INTRAVENOUS
  Filled 2023-05-20: qty 100

## 2023-05-20 NOTE — TOC Initial Note (Signed)
Transition of Care [facility]) - Initial/Assessment Note    Patient Details  Name: Brent Rogers MRN: [id number] Date of Birth: [birth year]  Transition of [facility]) CM/SW Contact:    Carley Hammed, LCSW Phone Number: 05/20/2023, 2:33 PM  Clinical Narrative:                 CSW was contacted by [facility] who offered a bed for pt. CSW spoke with pt who is agreeable. Pt wanted confirmation that this was not hospice. CSW explained the difference between LTAC and hospice, pt noted understanding. Pt requested CSW contact brother, VM left. [facility] will start authorization, they requested updated documentation from MD stating reason for LTAC, request relayed to medical team. TOC will continue to follow for DC needs.   Expected Discharge Plan: Long Term Acute Care (LTAC) Barriers to Discharge: Insurance Authorization   Patient Goals and CMS Choice Patient states their goals for this hospitalization and ongoing recovery are:: Pt wants to be able to continue recieving care. CMS Medicare.gov Compare Post Acute Care list provided to:: Patient Choice offered to / list presented to : Patient      Expected Discharge Plan and Services   Discharge Planning Services: CM Consult Post Acute Care Choice: Long Term Acute Care (LTAC) Living arrangements for the past 2 months: Apartment                                      Prior Living Arrangements/Services Living arrangements for the past 2 months: Apartment Lives with:: Self Patient language and need for interpreter reviewed:: Yes Do you feel safe going back to the place where you live?: Yes      Need for Family Participation in Patient Care: Yes (Comment) Care giver support system in place?: Yes (comment)   Criminal Activity/Legal Involvement Pertinent to Current Situation/Hospitalization: No - Comment as needed  Activities of Daily Living Home Assistive Devices/Equipment: None ADL Screening (condition at time of admission) Patient's  cognitive ability adequate to safely complete daily activities?: Yes Is the patient deaf or have difficulty hearing?: No Does the patient have difficulty seeing, even when wearing glasses/contacts?: No Does the patient have difficulty concentrating, remembering, or making decisions?: No Patient able to express need for assistance with ADLs?: Yes Does the patient have difficulty dressing or bathing?: No Independently performs ADLs?: Yes (appropriate for developmental age) Does the patient have difficulty walking or climbing stairs?: No Weakness of Legs: None Weakness of Arms/Hands: None  Permission Sought/Granted Permission sought to share information with : Family Supports Permission granted to share information with : Yes, Verbal Permission Granted  Share Information with NAME: [name]     Permission granted to share info w Relationship: Brother     Emotional Assessment Appearance:: Appears stated age Attitude/Demeanor/Rapport: Engaged Affect (typically observed): Appropriate Orientation: : Oriented to Self, Oriented to Place, Oriented to  Time, Oriented to Situation Alcohol / Substance Use: Not Applicable Psych Involvement: No (comment)  Admission diagnosis:  Colitis [K52.9] 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] Hyponatremia [E87.1] Urinary tract infection without hematuria, site unspecified [N39.0] Patient Active Problem List   Diagnosis Date Noted   Acute respiratory failure with hypoxia (HCC) 04/25/2023   Transaminitis 04/17/2023   Colitis 04/17/2023   UTI (urinary tract infection) 04/17/2023   Hyponatremia 04/17/2023   Elevated LFTs    Gastric ulcer without hemorrhage or perforation    Acute hepatitis 10/09/2021   Pressure injury of  skin 09/26/2021   GI bleed 09/25/2021   Major depressive disorder, recurrent episode, severe (HCC) 09/04/2021   GAD (generalized anxiety disorder) 09/04/2021   Major depressive disorder, recurrent episode, moderate (HCC) 03/24/2021   Open wound of pharynx  without complication 03/11/2021   Atherosclerosis of native coronary artery of native heart without angina pectoris    Blood in stool    Iron deficiency anemia    Syncope 03/06/2021   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   Chronic pain syndrome 06/07/2019   Depression 06/07/2019   Chronic pancreatitis (HCC) 05/01/2017   Foot callus 12/25/2015   Onychomycosis of toenail 12/25/2015   Fatty liver 11/12/2015   GERD (gastroesophageal reflux disease) 11/12/2015   HTN (hypertension) 07/10/2015   S/P cholecystectomy 07/10/2015   Tobacco abuse 12/26/2014   Protein-calorie malnutrition, severe (HCC) 11/20/2014   Severe malnutrition (HCC) 06/08/2014   Pancreatic insufficiency 12/23/2013   PCP:  Marcine Matar, MD Pharmacy:   [facility] [address], [address] [place] [address] Phone: [phone] Fax: [phone]     Social Determinants of Health (SDOH) Social History: SDOH Screenings   Food Insecurity: No Food Insecurity (05/13/2023)  Housing: Low Risk  (05/13/2023)  Transportation Needs: No Transportation Needs (05/13/2023)  Utilities: Not At Risk (05/13/2023)  Alcohol Screen: Low Risk  (09/03/2021)  Depression (PHQ2-9): Medium Risk (03/21/2021)  Tobacco Use: High Risk (04/17/2023)   SDOH Interventions:     Readmission Risk Interventions    10/14/2021    2:38 PM  Readmission Risk Prevention Plan  Transportation Screening Complete  PCP or Specialist Appt within 3-5 Days Complete  HRI or Home Care Consult Complete  Social Work Consult for Recovery Care Planning/Counseling Complete  Palliative Care Screening Not Applicable  Medication Review Oceanographer) Complete

## 2023-05-20 NOTE — Plan of Care (Signed)
  Problem: Elimination: Goal: Will not experience complications related to urinary retention Outcome: Progressing   Problem: Respiratory: Goal: Ability to maintain a clear airway and adequate ventilation will improve Outcome: Progressing   Problem: Role Relationship: Goal: Method of communication will improve Outcome: Progressing   Problem: Health Behavior/Discharge Planning: Goal: Ability to manage health-related needs will improve Outcome: Not Progressing   Problem: Activity: Goal: Risk for activity intolerance will decrease Outcome: Not Progressing   Problem: Activity: Goal: Ability to tolerate increased activity will improve Outcome: Not Progressing

## 2023-05-20 NOTE — Progress Notes (Signed)
OT Cancellation Note  Patient Details Name: Brent Rogers MRN: [id number] DOB: 16-Dec-1965   Cancelled Treatment:    Reason Eval/Treat Not Completed: Patient declined, no reason specified;Pain limiting ability to participate (Pt declined participation in OT session this day secondary to pt reporting pain in back and having just repositioned in the bed. OT educated pt in role/importance of OT with pt stating understanding and that he is completing ADLs with NTs and doing B UE exercises on his own with previously provided theraband and with pt continuing to decline OT this day. OT to continue to follow pt acutely and will attempt to see pt again at a later time as appropriate/available.)  Bran Aldridge "Ronaldo Miyamoto" M., OTR/L, MA Acute Rehab [phone]   Lendon Colonel 05/20/2023, 1:55 PM

## 2023-05-20 NOTE — Progress Notes (Signed)
PROGRESS NOTE  Brent Rogers  WUJ:[id number] DOB: May 04, 1966 DOA: 04/17/2023 PCP: Marcine Matar, MD   Brief Narrative: Patient is a 57 year old male with history of chronic alcohol abuse, PUD, chronic pancreatitis, prior pancreatectomy, cholecystectomy who presented with abdominal pain.  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  General surgery consulted, plan for conservative management with TPN to improve nutritional status.  Might need repair at some point.  He was initially admitted under [facility] service, transferred to ICU due to worsening respiratory status.  Back to [facility] service on 8/18.  PT/OT recommending SNF.  Patient does not want to participate with physical therapy, still on TPN.  Difficult disposition.  Palliative care, TOC following.  Significant events: 7/19 admit 7/26 thoracentesis with 800 cc of fluid obtained 7/29 tolerating SBT 7/30 self extubated during SBT. 7/31 reintubated for increased respiratory distress. 8/1 failed SBT, significant agitation  8/2 Failed SBT again this am due to agitation, remains on fentanyl and propofol  8/12 remains on high flow nasal cannula, difficulty weaning, consult to palliative 8/13 still trying to wean... however patient refused PT yesterady   8/14 minimal to no improvement, refusing care, refusing turns, refusing PT  8/16 remains on [facility]  8/17 off HHFNC, now on salter catheter, o2 requirements improving  8/18 transferred to the hospitalist service  Assessment & Plan:  Principal Problem:   Hyponatremia Active Problems:   Pancreatic insufficiency   HTN (hypertension)   GERD (gastroesophageal reflux disease)   History of partial pancreatectomy   Chronic pain syndrome   Iron deficiency anemia   Atherosclerosis of native coronary artery of native heart without angina pectoris   Major depressive disorder, recurrent episode, severe (HCC)   GAD (generalized anxiety disorder)   Gastric ulcer without hemorrhage or perforation    Transaminitis   Colitis   UTI (urinary tract infection)   Acute respiratory failure with hypoxia (HCC)  Acute hypoxic respiratory failure, recurrent aspiration pneumonia: Had to be transferred to ICU because of worsening respiratory status.  Completed course of antibiotics.  Currently on 4 L of oxygen.  Continue to wean  Gastrocolic fistula: Presented with abdominal pain.  General surgery consulted.  Recommended conservative management for now, TPN.  Patient continues to refuse physical therapy which is a problem.  General surgery planning for fistula repair at some point but he needs to be optimized medically, need to participate with physical therapy. Currently on clear liquid, TPN.  General surgery intermittently following. Abdomen is distended but soft and nontender.  Denies abdomen pain.  Pancreatic mass status post Whipple's in 2015, severe protein calorie malnutrition: On TPN.  Leukocytosis: Chronic.  Continue to monitor.  Completed antibiotics course for pneumonia.  Continue to monitor.  Urine culture growing Klebsiella.  Blood cultures have been negative so far.  Constipation: Continue bowel regimen.  Electrolyte abnormalities : Continue to monitor and supplement as needed  Normocytic anemia: Likely history with chronic medical conditions.  Currently hemoglobin in the range of 7.  Continue to monitor  DVT of unknown duration: On therapeutic Lovenox  Bilateral foot wounds: Present on admission.  Continue supportive care, wound care following  Debility/deconditioning: PT/OT recommending SNF on discharge.  TOC following.  At baseline, ambulates without a walker.  Difficult discharge planning due to being on TPN, unwillingness to participate with physical therapy  Goals of care: Inpatient with multiple comorbidities, severe debility, on TPN.  Palliative care following.  Current plan is to continue full course of treatment   Nutrition Problem: Severe Malnutrition  Etiology: chronic  illness Pressure Injury Buttocks Posterior;Right;Left Deep Tissue Pressure Injury - Purple or maroon localized area of discolored intact skin or blood-filled blister due to damage of underlying soft tissue from pressure and/or shear. blisters noted to both sides (Active)     Location: Buttocks  Location Orientation: Posterior;Right;Left  Staging: Deep Tissue Pressure Injury - Purple or maroon localized area of discolored intact skin or blood-filled blister due to damage of underlying soft tissue from pressure and/or shear.  Wound Description (Comments): blisters noted to both sides of sacrum 2x2 each  Present on Admission: No  Dressing Type Foam - Lift dressing to assess site every shift 05/19/23 2000    DVT prophylaxis:Place TED hose Start: 05/03/23 0804 SCDs Start: 04/17/23 1802     Code Status: Full Code  Family Communication: None at the bedside  Patient status:Inpatient  Patient is from :Home  Anticipated discharge to:SNF  Estimated DC date:not sure   Consultants: PCCM, general surgery  Procedures: None yet  Antimicrobials:  Anti-infectives (From admission, onward)    Start     Dose/Rate Route Frequency Ordered Stop   05/11/23 2000  vancomycin (VANCOREADY) IVPB 1250 mg/250 mL        1,250 mg 166.7 mL/hr over 90 Minutes Intravenous Every 24 hours 05/10/23 1525 05/10/23 2128   05/05/23 1100  vancomycin (VANCOREADY) IVPB 1250 mg/250 mL  Status:  Discontinued        1,250 mg 166.7 mL/hr over 90 Minutes Intravenous Every 24 hours 05/05/23 0951 05/10/23 1525   05/03/23 1100  meropenem (MERREM) 1 g in sodium chloride 0.9 % 100 mL IVPB        1 g 200 mL/hr over 30 Minutes Intravenous Every 8 hours 05/03/23 1007 05/09/23 2032   04/24/23 0300  micafungin (MYCAMINE) 100 mg in sodium chloride 0.9 % 100 mL IVPB  Status:  Discontinued        100 mg 105 mL/hr over 1 Hours Intravenous Daily 04/24/23 0108 04/26/23 0747   04/24/23 0200  vancomycin (VANCOCIN) IVPB 1000 mg/200 mL  premix  Status:  Discontinued        1,000 mg 200 mL/hr over 60 Minutes Intravenous 2 times daily 04/24/23 0114 04/26/23 0747   04/23/23 1400  piperacillin-tazobactam (ZOSYN) IVPB 3.375 g  Status:  Discontinued        3.375 g 12.5 mL/hr over 240 Minutes Intravenous Every 8 hours 04/23/23 1138 04/28/23 0755   04/20/23 1200  cefTRIAXone (ROCEPHIN) 2 g in sodium chloride 0.9 % 100 mL IVPB  Status:  Discontinued        2 g 200 mL/hr over 30 Minutes Intravenous Every 24 hours 04/20/23 1133 04/23/23 1138   04/18/23 1600  cefTRIAXone (ROCEPHIN) 2 g in sodium chloride 0.9 % 100 mL IVPB  Status:  Discontinued        2 g 200 mL/hr over 30 Minutes Intravenous Every 24 hours 04/17/23 1807 04/20/23 1133   04/18/23 0500  metroNIDAZOLE (FLAGYL) IVPB 500 mg  Status:  Discontinued        500 mg 100 mL/hr over 60 Minutes Intravenous Every 12 hours 04/17/23 1807 04/23/23 1138   04/17/23 1600  cefTRIAXone (ROCEPHIN) 1 g in sodium chloride 0.9 % 100 mL IVPB        1 g 200 mL/hr over 30 Minutes Intravenous  Once 04/17/23 1550 04/17/23 1738   04/17/23 1600  metroNIDAZOLE (FLAGYL) IVPB 500 mg        500 mg 100 mL/hr over 60 Minutes Intravenous  Once 04/17/23 1550 04/17/23 1747       Subjective: Patient seen and examined at bedside today.  Hemodynamically stable.  Lying in bed.  Appears very weak and deconditioned.  Complains of back pain.  Alert and oriented.  Denies abdomen pain, nausea or vomiting.  Denies shortness of breath.  On 4 L of oxygen pulmonary, appears little tachypneic  Objective: Vitals:   05/20/23 0400 05/20/23 0447 05/20/23 0600 05/20/23 0816  BP: 116/78  120/82 121/81  Pulse: (!) 108  (!) 107 (!) 107  Resp: 15  16 17   Temp: 98.4 F (36.9 C)  98.4 F (36.9 C) 98.4 F (36.9 C)  TempSrc: Oral  Oral Oral  SpO2: 100% 95% 97% 100%  Weight:   52.7 kg   Height:        Intake/Output Summary (Last 24 hours) at 05/20/2023 0839 Last data filed at 05/20/2023 0701 Gross per 24 hour  Intake  2120.11 ml  Output 2500 ml  Net -379.89 ml   Filed Weights   05/18/23 0536 05/19/23 0620 05/20/23 0600  Weight: 51.1 kg 51.9 kg 52.7 kg    Examination: General exam: appears weak and deconditioned HEENT: PERRL Respiratory system:  no wheezes or crackles , diminished air sounds on bases, tachypnea Cardiovascular system: Sinus tachycardia.  Gastrointestinal system: Abdomen is distended but soft and nontender, hyperactive bowel sounds  Central nervous system: Alert and oriented Extremities: No edema, no clubbing ,no cyanosis Skin: Ulcers as above   Data Reviewed: I have personally reviewed following labs and imaging studies  CBC: Recent Labs  Lab 05/14/23 0530 05/15/23 0422 05/18/23 0614 05/20/23 0530  WBC 21.4* 25.8* 25.9* 23.1*  HGB 8.0* 8.0* 8.1* 7.8*  HCT 25.5* 26.0* 27.0* 26.3*  MCV 95.5 95.9 98.2 98.1  PLT 412* 484* 616* 574*   Basic Metabolic Panel: Recent Labs  Lab 05/14/23 0530 05/15/23 0422 05/16/23 0754 05/17/23 0355 05/18/23 0613 05/19/23 0500 05/20/23 0530  NA 132* 133* 134*  --  137 137 135  K 4.2 4.1 4.6  --  4.9 5.2* 4.6  CL 92* 93* 95*  --  100 101 100  CO2 31 33* 31  --  29 31 29   GLUCOSE 142* 151* 146*  --  139* 144* 154*  BUN 23* 25* 26*  --  27* 35* 33*  CREATININE 0.66 0.53* 0.63  --  0.66 0.45* 0.41*  CALCIUM 8.4* 8.5* 8.9  --  8.8* 8.9 8.6*  MG 1.8 1.8 1.8 1.8 1.8  --  1.7  PHOS 3.5  --  3.8  --  3.7  --   --      No results found for this or any previous visit (from the past 240 hour(s)).   Radiology Studies: No results found.  Scheduled Meds:  Chlorhexidine Gluconate Cloth  6 each Topical Q0600   enoxaparin (LOVENOX) injection  80 mg Subcutaneous Q12H   furosemide  40 mg Intravenous BID   hydrocerin   Topical Daily   pantoprazole (PROTONIX) IV  40 mg Intravenous QHS   senna-docusate  1 tablet Oral QHS   sodium chloride flush  10-40 mL Intracatheter Q12H   traZODone  50 mg Oral QHS   Continuous Infusions:  TPN  CYCLIC-ADULT (ION) 120 mL/hr at 05/20/23 0701     LOS: 32 days   Burnadette Pop, MD [facility] P8/21/2024, 8:39 AM

## 2023-05-20 NOTE — Progress Notes (Signed)
PHARMACY - TOTAL PARENTERAL NUTRITION CONSULT NOTE  Indication: Gastrocolic fistula   Patient Measurements: Height: 6\' 3"  (190.5 cm) Weight: 52.7 kg (116 lb 2.9 oz) IBW/kg (Calculated) : 84.5 TPN AdjBW (KG): 53.2 Body mass index is 14.52 kg/m. 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  Patient presented on 7/19 with N/V x 4 days along with LLQ pain.  CT showed thickening of the distal transverse colon with complex fistulous connection between the transverse colon to the gastrojejunal anastomosis and adjacent small bowel loops.  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  Pharmacy consulted to manage TPN.  Patient was started on CLD on 7/22 and reported fullness/abdominal pain after eating.  He reports not eating for a week PTA.  He does not weigh himself, but he knows he has lost a lot of weight; his pants size reduced from a 36 to a 28 over a 6 month time frame.  He typically eats 2 meals a day (lunch and dinner) and he eats protein, vegetables and carb; he rarely eats fruits.   Glucose / Insulin: no hx DM . BG <180, off SSI 7/31 - significant hypoglycemia on cyclic TPN   8/1 - insulin removed from TPN and BG stabilized Electrolytes: K 4.6, Mg still 1.7 (max in TPN; received 2-4g IV daily since 8/8), CoCa 10.28, others WNL Renal: SCr 0.45, BUN 35, furosemide IV 40 BID since 8/12  Hepatic: LFTs/TG wnl, albumin 1.9 Intake / Output; MIVF: O2@ 4L; UOP 2 ml/kg/hr, LBM 8/17, net neg 5.8L/last 30 days GI Imaging:  7/25 CT: severe fatty liver, thickened appearance of colon GI Surgeries / Procedures: none since TPN initiation  Central access: PICC placed 04/21/23 TPN start date: 04/22/23  Nutritional Goals: Goal concentrated TPN rate is 75 mL/hr (provides 131g AA and 2232 kCal per day)    8/1 RD Estimated Needs Total Energy Estimated Needs:  2200-2400 kcals Total Protein Estimated Needs: 120-135 g Total Fluid Estimated Needs: 2L  Current Nutrition:  Clear liquids (for pleasure, poor absorption expected) - eating very little **Patient refusing some care TPN  Plan:  Continue cycled concentrated TPN over 16 hr (rate 60-120 ml/hr, GIR 3.28- 6.55 mg/kg/min), meeting 100% of estimated needs *note cannot cycle down further due to GIR* Electrolytes in TPN: Na 150 mEq/L (max), K 20 mEq/L, Ca 0 mEq/L (removed 8/20), Mag 15 mEq/L (max), Phos 20 mmol/L, ZO:XWRU max Cl Add daily multivitamin and trace elements in TPN  Give Mg 4g x1  Continue BG checks to q12 hr Monitor diuresis plan (stable since 8/12)    Standard TPN labs every Mon/Thurs and prn Per surgery, plan for GJ-colonic fistula repair when nutrition status improved    Alphia Moh, PharmD, BCPS, [facility] Clinical Pharmacist  Please check AMION for all [facility] Pharmacy phone numbers After 10:00 PM, call Main Pharmacy [phone]

## 2023-05-20 NOTE — Progress Notes (Addendum)
Palliative Medicine Inpatient Follow Up Note HPI: 57 y.o. 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 Conservative management and TPN to try to help nutritional status.   Today's Discussion 05/20/2023  *Please note that this is a verbal dictation therefore any spelling or grammatical errors are due to the "Dragon [employer] One" system interpretation.  Chart reviewed inclusive of vital signs, progress notes, laboratory results, and diagnostic images.   I spoke to patients RN, Jill Alexanders who shares that patient is more willing to sit at the edge of the bed after speaking with their charge RN.  I met at bedside with [place] this morning. He is sharing some lower back pain as a result of lying in the bed. We discussed getting some medications to help with pain burden. He shares that he has been tired though does state that he will work with therapy.  We reviewed his depressed mood in the setting of the loss of his independence.   Discussed that Terrick was able to name his brother. [name] and aunt, Malachi Bonds as his HCPOA yesterday. I shared the importance of ongoing conversations related to his wishes moving forward.  I called patients aunt Malachi Bonds and gave her an update. Discussed Fountain's current health state. She is aware that he has not put much effort into getting better and feels, "he has given up". We discussed depression playing a role also. We talked about initiating treatment to see if that will help.   Questions and concerns addressed/Palliative Support Provided.   Objective Assessment: Vital Signs Vitals:   05/20/23 0816 05/20/23 1100  BP: 121/81 107/76  Pulse: (!) 107 (!) 102  Resp: 17 17  Temp: 98.4 F (36.9 C) 98.6 F (37 C)  SpO2: 100% 98%     Intake/Output Summary (Last 24 hours) at 05/20/2023 1243 Last data filed at 05/20/2023 0701 Gross per 24 hour  Intake 2120.11 ml  Output 1500 ml  Net 620.11 ml   Last Weight  Most recent update: 05/20/2023  6:36 AM    Weight  52.7 kg (116 lb 2.9 oz)            Gen:  Middle Aged AA M in NAD HEENT: Dry mucous membranes CV: Irregular rate and rhythm  PULM: On 6LPM [place], breathing is even and nonlabored ABD: soft/nontender  EXT: Cachectic Neuro: Alert and oriented x3   SUMMARY OF RECOMMENDATIONS   Full Code/ Full Scope of Care  Patient desires morning activities (labs, vitals, cleaning) be modified to later in the morning  HCPOA documents completed yesterday naming [name] (brother) and Malachi Bonds (aunt) as surrogate decision makers  Goals at this time are to see if improvements can be made --> patient aware that if at any time he desire stopping aggressive measures that this could be pursued through comfort focused care  Patient with notable depressed mood will initiate mirtazapine this evening to support appetite, depression, and sleep  Plan for LTACH placement at [facility] if insurance approves   Ongoing PMT support  Billing based on MDM: High ______________________________________________________________________________________ Lamarr Lulas [place] Palliative Medicine Team Team Cell Phone: [phone] Please utilize secure chat with additional questions, if there is no response within 30 minutes please call the above phone number  Palliative Medicine Team providers are available by phone from 7am to 7pm daily and can be  reached through the team cell phone.  Should this patient require assistance outside of these hours, please call the patient's attending physician.

## 2023-05-20 NOTE — Progress Notes (Signed)
Patient called stating that patient's heart rate became as high as 120. Went into patient's room and he appeared to be trying to get out of bed but when asked, he stated that his oxygen had fallen out of his nose. Patient's baseline heart rate has been 90s-sinus tach low 100s. Tightened patient's oxygen and heart rate decreased to 104 which is his baseline rate.

## 2023-05-21 ENCOUNTER — Inpatient Hospital Stay (HOSPITAL_COMMUNITY): Payer: BLUE CROSS/BLUE SHIELD

## 2023-05-21 DIAGNOSIS — E871 Hypo-osmolality and hyponatremia: Secondary | ICD-10-CM | POA: Diagnosis not present

## 2023-05-21 LAB — COMPREHENSIVE METABOLIC PANEL
ALT: 60 U/L — ABNORMAL HIGH (ref 0–44)
AST: 66 U/L — ABNORMAL HIGH (ref 15–41)
Albumin: 1.9 g/dL — ABNORMAL LOW (ref 3.5–5.0)
Alkaline Phosphatase: 138 U/L — ABNORMAL HIGH (ref 38–126)
Anion gap: 7 (ref 5–15)
BUN: 31 mg/dL — ABNORMAL HIGH (ref 6–20)
CO2: 32 mmol/L (ref 22–32)
Calcium: 8.7 mg/dL — ABNORMAL LOW (ref 8.9–10.3)
Chloride: 100 mmol/L (ref 98–111)
Creatinine, Ser: 0.49 mg/dL — ABNORMAL LOW (ref 0.61–1.24)
GFR, Estimated: >60 mL/min (ref >60)
Glucose, Bld: 145 mg/dL — ABNORMAL HIGH (ref 70–99)
Potassium: 4.8 mmol/L (ref 3.5–5.1)
Sodium: 139 mmol/L (ref 135–145)
Total Bilirubin: 0.4 mg/dL (ref 0.3–1.2)
Total Protein: 6.2 g/dL — ABNORMAL LOW (ref 6.5–8.1)

## 2023-05-21 LAB — CBC
HCT: 25.6 % — ABNORMAL LOW (ref 39.0–52.0)
Hemoglobin: 7.7 g/dL — ABNORMAL LOW (ref 13.0–17.0)
MCH: 28.6 pg (ref 26.0–34.0)
MCHC: 30.1 g/dL (ref 30.0–36.0)
MCV: 95.2 fL (ref 80.0–100.0)
Platelets: 584 10*3/uL — ABNORMAL HIGH (ref 150–400)
RBC: 2.69 MIL/uL — ABNORMAL LOW (ref 4.22–5.81)
RDW: 16.5 % — ABNORMAL HIGH (ref 11.5–15.5)
WBC: 27.9 10*3/uL — ABNORMAL HIGH (ref 4.0–10.5)
nRBC: 0 % (ref 0.0–0.2)

## 2023-05-21 LAB — GLUCOSE, CAPILLARY: Glucose-Capillary: 165 mg/dL — ABNORMAL HIGH (ref 70–99)

## 2023-05-21 LAB — PROCALCITONIN: Procalcitonin: 0.36 ng/mL

## 2023-05-21 LAB — PHOSPHORUS: Phosphorus: 3.3 mg/dL (ref 2.5–4.6)

## 2023-05-21 LAB — MAGNESIUM: Magnesium: 1.8 mg/dL (ref 1.7–2.4)

## 2023-05-21 MED ORDER — MAGNESIUM SULFATE 4 GM/100ML IV SOLN
4.0000 g | Freq: Once | INTRAVENOUS | Status: AC
  Administered 2023-05-21: 4 g via INTRAVENOUS
  Filled 2023-05-21: qty 100

## 2023-05-21 MED ORDER — IOHEXOL 350 MG/ML SOLN
75.0000 mL | Freq: Once | INTRAVENOUS | Status: AC | PRN
  Administered 2023-05-21: 75 mL via INTRAVENOUS

## 2023-05-21 MED ORDER — TRACE MINERALS CU-MN-SE-ZN 300-55-60-3000 MCG/ML IV SOLN
INTRAVENOUS | Status: AC
  Filled 2023-05-21: qty 804.67

## 2023-05-21 NOTE — Progress Notes (Signed)
PROGRESS NOTE  Brent Rogers  HYQ:[id number] DOB: 12/28/1965 DOA: 04/17/2023 PCP: Marcine Matar, MD   Brief Narrative: Patient is a 57 year old male with history of chronic alcohol abuse, PUD, chronic pancreatitis, prior pancreatectomy, cholecystectomy who presented with abdominal pain.  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  General surgery consulted, plan made  for conservative management with TPN to improve nutritional status.  Might need repair at some point.  He was initially admitted under [facility] service, transferred to ICU due to worsening respiratory status.  Back to [facility] service on 8/18.   Patient was not willing participate with physical therapy, still on TPN .  Palliative care, TOC following.  Plan is for possible discharge to [facility].  He needs LTAC because of ongoing  TPN  Significant events: 7/19 admit 7/26 thoracentesis with 800 cc of fluid obtained 7/29 tolerating SBT 7/30 self extubated during SBT. 7/31 reintubated for increased respiratory distress. 8/1 failed SBT, significant agitation  8/2 Failed SBT again this am due to agitation, remains on fentanyl and propofol  8/12 remains on high flow nasal cannula, difficulty weaning, consult to palliative 8/13 still trying to wean... however patient refused PT yesterady   8/14 minimal to no improvement, refusing care, refusing turns, refusing PT  8/16 remains on [facility]  8/17 off HHFNC, now on salter catheter, o2 requirements improving  8/18 transferred to the hospitalist service  Assessment & Plan:  Principal Problem:   Hyponatremia Active Problems:   Pancreatic insufficiency   HTN (hypertension)   GERD (gastroesophageal reflux disease)   History of partial pancreatectomy   Chronic pain syndrome   Iron deficiency anemia   Atherosclerosis of native coronary artery of native heart without angina pectoris   Major depressive disorder, recurrent episode, severe (HCC)   GAD (generalized anxiety disorder)   Gastric ulcer  without hemorrhage or perforation   Transaminitis   Colitis   UTI (urinary tract infection)   Acute respiratory failure with hypoxia (HCC)  Acute hypoxic respiratory failure, recurrent aspiration pneumonia: Had to be transferred to ICU because of worsening respiratory status.  Completed course of antibiotics.  Currently on 4 L of oxygen.  Continue to wean if possible.  Lungs are mostly clear on auscultation.  Gastrocolic fistula: Presented with abdominal pain.  General surgery consulted.  Recommended conservative management for now, TPN.  Patient continues to refuse physical therapy which is a problem.  General surgery planning for fistula repair at some point but he needs to be optimized medically, need to participate with physical therapy. Currently on clear liquid, TPN.   Abdomen is mildly distended but soft and nontender.  Denies current abdomen pain.  Last bowel movement couple of days ago.  We have requested for general surgery follow-up before discharge to [facility]  Pancreatic mass status post Whipple's in 2015, severe protein calorie malnutrition: On TPN.  Leukocytosis: Chronic.  Continue to monitor.  Completed antibiotics course for pneumonia.  Urine culture growed Klebsiella.  Blood cultures have been negative so far.  Constipation: Continue bowel regimen.   Electrolyte abnormalities : Continue to monitor and supplement as needed  Normocytic anemia: Likely history with chronic medical conditions.  Currently hemoglobin in the range of 7.  Continue to monitor  DVT of unknown duration: On therapeutic Lovenox  Bilateral foot wounds: Present on admission.  Continue supportive care, wound care following  Debility/deconditioning: TOC following.  At baseline, ambulates without a walker.  Difficult discharge planning due to being on TPN, unwillingness to participate with physical therapy.  Now the plan is for [facility]  Goals of care: Inpatient with multiple comorbidities, severe debility,  on TPN.  Palliative care following.  Current plan is to continue full course of treatment   Nutrition Problem: Severe Malnutrition Etiology: chronic illness Pressure Injury Buttocks Posterior;Right;Left Deep Tissue Pressure Injury - Purple or maroon localized area of discolored intact skin or blood-filled blister due to damage of underlying soft tissue from pressure and/or shear. blisters noted to both sides (Active)     Location: Buttocks  Location Orientation: Posterior;Right;Left  Staging: Deep Tissue Pressure Injury - Purple or maroon localized area of discolored intact skin or blood-filled blister due to damage of underlying soft tissue from pressure and/or shear.  Wound Description (Comments): blisters noted to both sides of sacrum 2x2 each  Present on Admission: No  Dressing Type Foam - Lift dressing to assess site every shift 05/21/23 0610    DVT prophylaxis:Place TED hose Start: 05/03/23 0804 SCDs Start: 04/17/23 1802     Code Status: Full Code  Family Communication: called and discussed with brother [name] on phone on 8/22  Patient status:Inpatient  Patient is from :Home  Anticipated discharge to: [facility]  Estimated DC date: 1 to 2 days, awaiting approval from insurance   Consultants: PCCM, general surgery  Procedures: None yet  Antimicrobials:  Anti-infectives (From admission, onward)    Start     Dose/Rate Route Frequency Ordered Stop   05/11/23 2000  vancomycin (VANCOREADY) IVPB 1250 mg/250 mL        1,250 mg 166.7 mL/hr over 90 Minutes Intravenous Every 24 hours 05/10/23 1525 05/10/23 2128   05/05/23 1100  vancomycin (VANCOREADY) IVPB 1250 mg/250 mL  Status:  Discontinued        1,250 mg 166.7 mL/hr over 90 Minutes Intravenous Every 24 hours 05/05/23 0951 05/10/23 1525   05/03/23 1100  meropenem (MERREM) 1 g in sodium chloride 0.9 % 100 mL IVPB        1 g 200 mL/hr over 30 Minutes Intravenous Every 8 hours 05/03/23 1007 05/09/23 2032   04/24/23 0300   micafungin (MYCAMINE) 100 mg in sodium chloride 0.9 % 100 mL IVPB  Status:  Discontinued        100 mg 105 mL/hr over 1 Hours Intravenous Daily 04/24/23 0108 04/26/23 0747   04/24/23 0200  vancomycin (VANCOCIN) IVPB 1000 mg/200 mL premix  Status:  Discontinued        1,000 mg 200 mL/hr over 60 Minutes Intravenous 2 times daily 04/24/23 0114 04/26/23 0747   04/23/23 1400  piperacillin-tazobactam (ZOSYN) IVPB 3.375 g  Status:  Discontinued        3.375 g 12.5 mL/hr over 240 Minutes Intravenous Every 8 hours 04/23/23 1138 04/28/23 0755   04/20/23 1200  cefTRIAXone (ROCEPHIN) 2 g in sodium chloride 0.9 % 100 mL IVPB  Status:  Discontinued        2 g 200 mL/hr over 30 Minutes Intravenous Every 24 hours 04/20/23 1133 04/23/23 1138   04/18/23 1600  cefTRIAXone (ROCEPHIN) 2 g in sodium chloride 0.9 % 100 mL IVPB  Status:  Discontinued        2 g 200 mL/hr over 30 Minutes Intravenous Every 24 hours 04/17/23 1807 04/20/23 1133   04/18/23 0500  metroNIDAZOLE (FLAGYL) IVPB 500 mg  Status:  Discontinued        500 mg 100 mL/hr over 60 Minutes Intravenous Every 12 hours 04/17/23 1807 04/23/23 1138   04/17/23 1600  cefTRIAXone (ROCEPHIN) 1 g in sodium  chloride 0.9 % 100 mL IVPB        1 g 200 mL/hr over 30 Minutes Intravenous  Once 04/17/23 1550 04/17/23 1738   04/17/23 1600  metroNIDAZOLE (FLAGYL) IVPB 500 mg        500 mg 100 mL/hr over 60 Minutes Intravenous  Once 04/17/23 1550 04/17/23 1747       Subjective: Patient seen and examined the bedside today.  Hemodynamically stable.  Lying in bed.  On 4 L of oxygen.  On mild sinus tachycardia.  Denies any significant pain today.  Last bowel movement was couple of days ago.  He looks more comfortable than yesterday.  Tolerating clear liquid diet.  Abdomen is less distended than yesterday  Objective: Vitals:   05/21/23 0320 05/21/23 0550 05/21/23 0754 05/21/23 0755  BP:    115/75  Pulse: (!) 114 (!) 107  (!) 111  Resp: (!) 24 19  (!) 22  Temp: 98  F (36.7 C)     TempSrc: Oral  Oral Oral  SpO2: 99% 100%  100%  Weight:  56 kg    Height:        Intake/Output Summary (Last 24 hours) at 05/21/2023 1034 Last data filed at 05/21/2023 0703 Gross per 24 hour  Intake 2096.95 ml  Output 2200 ml  Net -103.05 ml   Filed Weights   05/19/23 0620 05/20/23 0600 05/21/23 0550  Weight: 51.9 kg 52.7 kg 56 kg    Examination:  General exam: Not in distress, appears weak and deconditioned HEENT: PERRL Respiratory system:  no wheezes or crackles, diminished air sounds on bases Cardiovascular system: Mild sinus tachycardia Gastrointestinal system: Abdomen is mildly distended, soft and nontender. Central nervous system: Alert and oriented Extremities: No edema, no clubbing ,no cyanosis Skin: Ulcers as above   Data Reviewed: I have personally reviewed following labs and imaging studies  CBC: Recent Labs  Lab 05/15/23 0422 05/18/23 0614 05/20/23 0530 05/21/23 0450  WBC 25.8* 25.9* 23.1* 27.9*  HGB 8.0* 8.1* 7.8* 7.7*  HCT 26.0* 27.0* 26.3* 25.6*  MCV 95.9 98.2 98.1 95.2  PLT 484* 616* 574* 584*   Basic Metabolic Panel: Recent Labs  Lab 05/16/23 0754 05/17/23 0355 05/18/23 0613 05/19/23 0500 05/20/23 0530 05/21/23 0450  NA 134*  --  137 137 135 139  K 4.6  --  4.9 5.2* 4.6 4.8  CL 95*  --  100 101 100 100  CO2 31  --  29 31 29  32  GLUCOSE 146*  --  139* 144* 154* 145*  BUN 26*  --  27* 35* 33* 31*  CREATININE 0.63  --  0.66 0.45* 0.41* 0.49*  CALCIUM 8.9  --  8.8* 8.9 8.6* 8.7*  MG 1.8 1.8 1.8  --  1.7 1.8  PHOS 3.8  --  3.7  --   --  3.3     No results found for this or any previous visit (from the past 240 hour(s)).   Radiology Studies: No results found.  Scheduled Meds:  Chlorhexidine Gluconate Cloth  6 each Topical Q0600   enoxaparin (LOVENOX) injection  80 mg Subcutaneous Q12H   furosemide  40 mg Intravenous BID   hydrocerin   Topical Daily   mirtazapine  15 mg Oral QHS   pantoprazole (PROTONIX) IV  40 mg  Intravenous QHS   senna-docusate  1 tablet Oral QHS   sodium chloride flush  10-40 mL Intracatheter Q12H   Continuous Infusions:  magnesium sulfate bolus IVPB 4 g (05/21/23  6962)   TPN CYCLIC-ADULT (ION)       LOS: 33 days   Burnadette Pop, MD [facility] P8/22/2024, 10:34 AM

## 2023-05-21 NOTE — Progress Notes (Signed)
[name].  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  Previously followed by our team.  Our last note was on 8/5 and was reviewed.  We were called by [facility] today as patient is anticipated to discharge to an LTAC soon.  They asked [place] to leave a note to ensure there is no contraindication from our standpoint for discharge.  We have discussed with Dr. Donell Beers.  No plans for surgery here. Patient should continue TPN to improve his nutrition and follow-up in our office.  We have arranged this follow-up. Okay for PO's for comfort but likely will not absorb any nutrition from this. If there is any questions or concerns regarding the patient or his clinical status please call our team back.  Otherwise we will continue to follow from a distance.  Brent Halim, PA-C

## 2023-05-21 NOTE — TOC Progression Note (Addendum)
Transition of Care [facility]) - Progression Note    Patient Details  Name: Brent Rogers MRN: [id number] Date of Birth: 1966/04/29  Transition [facility]) CM/SW Contact  Carley Hammed, LCSW Phone Number: 05/21/2023, 12:55 PM  Clinical Narrative:    CSW confirmed with [facility] they were able to start the authorization for LTAC and will advise CSW when it is approved. Medical team notified to barrier. TOC will continue to follow for DC needs.   4:00 Auth approved for [employer]. Plan to DC in AM with medical clearance. TOC will continue to follow for DC needs.  Expected Discharge Plan: Long Term Acute Care (LTAC) Barriers to Discharge: Insurance Authorization  Expected Discharge Plan and Services   Discharge Planning Services: CM Consult Post Acute Care Choice: Long Term Acute Care (LTAC) Living arrangements for the past 2 months: Apartment                                       Social Determinants of Health (SDOH) Interventions SDOH Screenings   Food Insecurity: No Food Insecurity (05/13/2023)  Housing: Low Risk  (05/13/2023)  Transportation Needs: No Transportation Needs (05/13/2023)  Utilities: Not At Risk (05/13/2023)  Alcohol Screen: Low Risk  (09/03/2021)  Depression (PHQ2-9): Medium Risk (03/21/2021)  Tobacco Use: High Risk (04/17/2023)    Readmission Risk Interventions    10/14/2021    2:38 PM  Readmission Risk Prevention Plan  Transportation Screening Complete  PCP or Specialist Appt within 3-5 Days Complete  HRI or Home Care Consult Complete  Social Work Consult for Recovery Care Planning/Counseling Complete  Palliative Care Screening Not Applicable  Medication Review Oceanographer) Complete

## 2023-05-21 NOTE — Progress Notes (Addendum)
PHARMACY - TOTAL PARENTERAL NUTRITION CONSULT NOTE  Indication: Gastrocolic fistula   Patient Measurements: Height: 6\' 3"  (190.5 cm) Weight: 56 kg (123 lb 6.3 oz) IBW/kg (Calculated) : 84.5 TPN AdjBW (KG): 53.2 Body mass index is 15.42 kg/m. 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  Patient presented on 7/19 with N/V x 4 days along with LLQ pain.  CT showed thickening of the distal transverse colon with complex fistulous connection between the transverse colon to the gastrojejunal anastomosis and adjacent small bowel loops.  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  Pharmacy consulted to manage TPN.  Patient was started on CLD on 7/22 and reported fullness/abdominal pain after eating.  He reports not eating for a week PTA.  He does not weigh himself, but he knows he has lost a lot of weight; his pants size reduced from a 36 to a 28 over a 6 month time frame.  He typically eats 2 meals a day (lunch and dinner) and he eats protein, vegetables and carb; he rarely eats fruits.   Glucose / Insulin: no hx DM . BG <180, off SSI 7/31 - significant hypoglycemia on cyclic TPN   8/1 - insulin removed from TPN and BG stabilized Electrolytes: K 4.8, Mg still 1.8 (max in TPN; received 2-4g IV almost daily since 8/8), CoCa 10.38 (none in TPN), others WNL Renal: SCr 0.49, BUN 31, furosemide IV 40 BID since 8/12  Hepatic: Alk phos/AST/ALT mildly elevated, Tbili /TG wnl, albumin 1.9 Intake / Output; MIVF: UOP 1.6 ml/kg/hr, LBM 8/17  GI Imaging:  7/25 CT: severe fatty liver, thickened appearance of colon GI Surgeries / Procedures: none since TPN initiation  Central access: PICC placed 04/21/23 TPN start date: 04/22/23  Nutritional Goals: Goal concentrated TPN rate is 75 mL/hr (provides 121g AA and 2232 kCal per day)    8/1 RD Estimated Needs Total  Energy Estimated Needs: 2200-2400 kcals Total Protein Estimated Needs: 120-135 g Total Fluid Estimated Needs: 2L  Current Nutrition:  Clear liquids (for pleasure, poor absorption expected) - eating very little **Patient refusing some care TPN  Plan:  Continue cycled concentrated TPN over 16 hr (rate 57-113 ml/hr, GIR 3.14- 6.22 mg/kg/min), meeting 100% of estimated needs  *note cannot cycle down further due to GIR* Electrolytes in TPN: Na 150 mEq/L (max), decrease K 5 mEq/L, Ca 0 mEq/L (removed 8/20), Mag 15 mEq/L (max), Phos 20 mmol/L, WU:JWJX max Cl Add daily multivitamin and trace elements in TPN  Give Mg 4g x1  Stop BG checks q12 hr Monitor diuresis plan (stable since 8/12)    Standard TPN labs 8/24, every Mon/Thurs and prn Per surgery, plan for GJ-colonic fistula repair when nutrition status improved    Alphia Moh, PharmD, BCPS, [facility] Clinical Pharmacist  Please check AMION for all [facility] Pharmacy phone numbers After 10:00 PM, call Main Pharmacy [phone]

## 2023-05-21 NOTE — Progress Notes (Signed)
OT Cancellation Note  Patient Details Name: Brent Rogers MRN: [id number] DOB: June 20, 1966   Cancelled Treatment:    Reason Eval/Treat Not Completed: Patient declined, no reason specified (Pt declined participation in OT session this day with pt reporting he has not slept and wants to try to sleep. Pt educated on role/importance of OT with pt continuing to decline. OT to reattempt to see pt at a later time as available/appropriate.)  Nihar Klus "Ronaldo Miyamoto" M., OTR/L, MA Acute Rehab [phone]   Lendon Colonel 05/21/2023, 2:17 PM

## 2023-05-21 NOTE — Plan of Care (Signed)

## 2023-05-21 NOTE — Progress Notes (Signed)
There is a possible small right apical pneumothorax seen on CXR from this afternoon. Patient is stable on 5 Lpm supplemental O2. Plan to continue supplemental O2 and repeat CXR tomorrow.

## 2023-05-21 NOTE — Progress Notes (Signed)
Physical Therapy Treatment Patient Details Name: GAL LEFTON MRN: [id number] DOB: 1965-12-04 Today's Date: 05/21/2023   History of Present Illness Pt is a 57 y.o. 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 Conservative management with TPN with potential for eventual surgery. Rapid response called 7/26, on BiPAP and s/p thoracentesis 7/26. Intubated 7/27 and self-extubated 7/31, reintubated 7/31-8/3. Pt continues to require HHFNC or HFNC. PMH includes HTN, CAD, GERD, PUD, s/p cholecystectomy, partial pancreatectomy, anxiety, depression, chronic pain, scoliosis, ETOH abuse.    PT Comments  Patient participating in bed level therex with encouragement.  Three exercises for UE's and LE's with sustained holds so improved some with tolerance from last session.  On 4L O2 with VSS throughout.  Encouraged and came back twice as had pain meds right before first attempt.  Hopeful for improvements and for LTACH at d/c.    If plan is discharge home, recommend the following: A lot of help with walking and/or transfers;A lot of help with bathing/dressing/bathroom;Assist for transportation;Help with stairs or ramp for entrance;Assistance with cooking/housework;Direct supervision/assist for medications management   Can travel by private vehicle     No  Equipment Recommendations  Other (comment) (TBA)    Recommendations for Other Services       Precautions / Restrictions Precautions Precautions: Fall Precaution Comments: 5-6L [place] O2     Mobility  Bed Mobility               General bed mobility comments: declined mobility up or in bed    Transfers                        Ambulation/Gait                   Stairs             Wheelchair Mobility     Tilt Bed    Modified Rankin (Stroke Patients Only)       Balance                                             Cognition Arousal: Alert Behavior During Therapy: Flat affect Overall Cognitive Status: No family/caregiver present to determine baseline cognitive functioning                                 General Comments: continues to decline up OOB reporting too fatigued, participates in bed level activities well, but despite encouragement for progressing out of the hospital he states too fatigued.        Exercises General Exercises - Upper Extremity Shoulder Flexion: AROM, Both, 10 reps, Supine, Theraband Theraband Level (Shoulder Flexion): Level 2 (Red) Shoulder Horizontal ABduction: Strengthening, Theraband, 10 reps, Supine Theraband Level (Shoulder Horizontal Abduction): Level 2 (Red) Elbow Flexion: AROM, Supine, 10 reps, Theraband, Both Theraband Level (Elbow Flexion): Level 2 (Red) Elbow Extension: AROM, Supine, 10 reps, Theraband Theraband Level (Elbow Extension): Level 2 (Red) General Exercises - Lower Extremity Ankle Circles/Pumps: AROM, Both, Supine, 10 reps (with 3 sec hold) Short Arc Quad: AROM, Both, Strengthening, 10 reps, Supine (with 3 sec hold) Hip ABduction/ADduction: Strengthening, 10 reps, Both, Supine (hip adductor squeezes with 5 sec hold)    General Comments  Pertinent Vitals/Pain Pain Assessment Pain Assessment: Faces Faces Pain Scale: Hurts a little bit Pain Location: generalized with mobility Pain Descriptors / Indicators: Discomfort Pain Intervention(s): Monitored during session    Home Living                          Prior Function            PT Goals (current goals can now be found in the care plan section) Progress towards PT goals: Not progressing toward goals - comment    Frequency    Min 1X/week      PT Plan      Co-evaluation              AM-PAC PT "6 Clicks" Mobility   Outcome Measure  Help needed turning from your back to your side while in a flat bed without using bedrails?: A Lot Help needed  moving from lying on your back to sitting on the side of a flat bed without using bedrails?: Total Help needed moving to and from a bed to a chair (including a wheelchair)?: Total Help needed standing up from a chair using your arms (e.g., wheelchair or bedside chair)?: Total Help needed to walk in hospital room?: Total Help needed climbing 3-5 steps with a railing? : Total 6 Click Score: 7    End of Session Equipment Utilized During Treatment: Oxygen Activity Tolerance: Patient limited by fatigue Patient left: in bed;with call bell/phone within reach   PT Visit Diagnosis: Muscle weakness (generalized) (M62.81);Other abnormalities of gait and mobility (R26.89)     Time: 0865-7846 PT Time Calculation (min) (ACUTE ONLY): 24 min  Charges:    $Therapeutic Exercise: 23-37 mins PT General Charges $$ ACUTE PT VISIT: 1 Visit                     Sheran Lawless, PT Acute Rehabilitation Services Office:[phone] 05/21/2023    [name] 05/21/2023, 3:55 PM

## 2023-05-22 ENCOUNTER — Inpatient Hospital Stay (HOSPITAL_COMMUNITY): Payer: BLUE CROSS/BLUE SHIELD

## 2023-05-22 DIAGNOSIS — E871 Hypo-osmolality and hyponatremia: Secondary | ICD-10-CM | POA: Diagnosis not present

## 2023-05-22 LAB — COMPREHENSIVE METABOLIC PANEL
ALT: 41 U/L (ref 0–44)
AST: 30 U/L (ref 15–41)
Albumin: 1.8 g/dL — ABNORMAL LOW (ref 3.5–5.0)
Alkaline Phosphatase: 129 U/L — ABNORMAL HIGH (ref 38–126)
Anion gap: 6 (ref 5–15)
BUN: 19 mg/dL (ref 6–20)
CO2: 33 mmol/L — ABNORMAL HIGH (ref 22–32)
Calcium: 8.4 mg/dL — ABNORMAL LOW (ref 8.9–10.3)
Chloride: 97 mmol/L — ABNORMAL LOW (ref 98–111)
Creatinine, Ser: 0.4 mg/dL — ABNORMAL LOW (ref 0.61–1.24)
GFR, Estimated: >60 mL/min (ref >60)
Glucose, Bld: 160 mg/dL — ABNORMAL HIGH (ref 70–99)
Potassium: 4 mmol/L (ref 3.5–5.1)
Sodium: 136 mmol/L (ref 135–145)
Total Bilirubin: 0.4 mg/dL (ref 0.3–1.2)
Total Protein: 5.7 g/dL — ABNORMAL LOW (ref 6.5–8.1)

## 2023-05-22 LAB — CBC
HCT: 25.7 % — ABNORMAL LOW (ref 39.0–52.0)
Hemoglobin: 7.8 g/dL — ABNORMAL LOW (ref 13.0–17.0)
MCH: 29.2 pg (ref 26.0–34.0)
MCHC: 30.4 g/dL (ref 30.0–36.0)
MCV: 96.3 fL (ref 80.0–100.0)
Platelets: 557 10*3/uL — ABNORMAL HIGH (ref 150–400)
RBC: 2.67 MIL/uL — ABNORMAL LOW (ref 4.22–5.81)
RDW: 16.4 % — ABNORMAL HIGH (ref 11.5–15.5)
WBC: 21.3 10*3/uL — ABNORMAL HIGH (ref 4.0–10.5)
nRBC: 0 % (ref 0.0–0.2)

## 2023-05-22 LAB — GLUCOSE, CAPILLARY: Glucose-Capillary: 179 mg/dL — ABNORMAL HIGH (ref 70–99)

## 2023-05-22 MED ORDER — AMOXICILLIN-POT CLAVULANATE 875-125 MG PO TABS: 1.0000 | ORAL_TABLET | Freq: Two times a day (BID) | ORAL | Status: AC

## 2023-05-22 MED ORDER — POLYETHYLENE GLYCOL 3350 17 G PO PACK: 17.0000 g | PACK | Freq: Every day | ORAL | Status: DC

## 2023-05-22 MED ORDER — AMOXICILLIN-POT CLAVULANATE 875-125 MG PO TABS
1.0000 | ORAL_TABLET | Freq: Two times a day (BID) | ORAL | Status: DC
  Administered 2023-05-22: 1 via ORAL
  Filled 2023-05-22: qty 1

## 2023-05-22 MED ORDER — HYDROCODONE-ACETAMINOPHEN 7.5-325 MG/15ML PO SOLN: 10.0000 mL | ORAL | Status: DC | PRN

## 2023-05-22 MED ORDER — ONDANSETRON HCL 4 MG/2ML IJ SOLN: 4.0000 mg | Freq: Three times a day (TID) | INTRAMUSCULAR | Status: DC | PRN

## 2023-05-22 MED ORDER — TRACE MINERALS CU-MN-SE-ZN 300-55-60-3000 MCG/ML IV SOLN
INTRAVENOUS | Status: DC
  Filled 2023-05-22: qty 804.67

## 2023-05-22 MED ORDER — ENOXAPARIN SODIUM 80 MG/0.8ML IJ SOSY: 80.0000 mg | PREFILLED_SYRINGE | Freq: Two times a day (BID) | INTRAMUSCULAR | Status: DC

## 2023-05-22 MED ORDER — SENNOSIDES-DOCUSATE SODIUM 8.6-50 MG PO TABS: 1.0000 | ORAL_TABLET | Freq: Every day | ORAL | Status: DC

## 2023-05-22 MED ORDER — MIRTAZAPINE 15 MG PO TBDP: 15.0000 mg | ORAL_TABLET | Freq: Every day | ORAL | Status: DC

## 2023-05-22 MED ORDER — HYDROCERIN EX CREA: 1.0000 | TOPICAL_CREAM | Freq: Every day | CUTANEOUS | Status: DC

## 2023-05-22 NOTE — Plan of Care (Signed)
  Problem: Education: Goal: Knowledge of General Education information will improve Description: Including pain rating scale, medication(s)/side effects and non-pharmacologic comfort measures Outcome: Adequate for Discharge   Problem: Health Behavior/Discharge Planning: Goal: Ability to manage health-related needs will improve Outcome: Adequate for Discharge   Problem: Clinical Measurements: Goal: Ability to maintain clinical measurements within normal limits will improve Outcome: Adequate for Discharge Goal: Will remain free from infection Outcome: Adequate for Discharge Goal: Diagnostic test results will improve Outcome: Adequate for Discharge Goal: Respiratory complications will improve Outcome: Adequate for Discharge Goal: Cardiovascular complication will be avoided Outcome: Adequate for Discharge   Problem: Activity: Goal: Risk for activity intolerance will decrease Outcome: Adequate for Discharge   Problem: Nutrition: Goal: Adequate nutrition will be maintained Outcome: Adequate for Discharge   Problem: Coping: Goal: Level of anxiety will decrease Outcome: Adequate for Discharge   Problem: Elimination: Goal: Will not experience complications related to bowel motility Outcome: Adequate for Discharge Goal: Will not experience complications related to urinary retention Outcome: Adequate for Discharge   Problem: Pain Managment: Goal: General experience of comfort will improve Outcome: Adequate for Discharge   Problem: Safety: Goal: Ability to remain free from injury will improve Outcome: Adequate for Discharge   Problem: Skin Integrity: Goal: Risk for impaired skin integrity will decrease Outcome: Adequate for Discharge   Problem: Activity: Goal: Ability to tolerate increased activity will improve Outcome: Adequate for Discharge   Problem: Respiratory: Goal: Ability to maintain a clear airway and adequate ventilation will improve Outcome: Adequate for  Discharge   Problem: Role Relationship: Goal: Method of communication will improve Outcome: Adequate for Discharge   Problem: Education: Goal: Ability to describe self-care measures that may prevent or decrease complications (Diabetes Survival Skills Education) will improve Outcome: Adequate for Discharge Goal: Individualized Educational Video(s) Outcome: Adequate for Discharge   Problem: Coping: Goal: Ability to adjust to condition or change in health will improve Outcome: Adequate for Discharge   Problem: Fluid Volume: Goal: Ability to maintain a balanced intake and output will improve Outcome: Adequate for Discharge   Problem: Health Behavior/Discharge Planning: Goal: Ability to identify and utilize available resources and services will improve Outcome: Adequate for Discharge Goal: Ability to manage health-related needs will improve Outcome: Adequate for Discharge   Problem: Metabolic: Goal: Ability to maintain appropriate glucose levels will improve Outcome: Adequate for Discharge   Problem: Nutritional: Goal: Maintenance of adequate nutrition will improve Outcome: Adequate for Discharge Goal: Progress toward achieving an optimal weight will improve Outcome: Adequate for Discharge   Problem: Skin Integrity: Goal: Risk for impaired skin integrity will decrease Outcome: Adequate for Discharge   Problem: Tissue Perfusion: Goal: Adequacy of tissue perfusion will improve Outcome: Adequate for Discharge

## 2023-05-22 NOTE — TOC Transition Note (Addendum)
Transition of Care [facility]) - CM/SW Discharge Note   Patient Details  Name: Brent Rogers MRN: [id number] Date of Birth: Dec 06, 1965  Transition of Care [facility]) CM/SW Contact:  Carley Hammed, LCSW Phone Number: 05/22/2023, 11:20 AM   Clinical Narrative:    Pt to be transported to [facility] via [place] [facility]. Nurse to call report to [phone] or [phone]. Rm# [id number] Admitting MD Dr. Kinnie Feil   Final next level of care: Long Term Acute Care (LTAC) Barriers to Discharge: Barriers Resolved   Patient Goals and CMS Choice CMS Medicare.gov Compare Post Acute Care list provided to:: Patient Choice offered to / list presented to : Patient  Discharge Placement                Patient chooses bed at: [facility] Patient to be transferred to facility by: [facility] Name of family member notified: [name] Patient and family notified of of transfer: 05/22/23  Discharge Plan and Services Additional resources added to the After Visit Summary for     Discharge Planning Services: CM Consult Post Acute Care Choice: Long Term Acute Care (LTAC)                               Social Determinants of Health (SDOH) Interventions SDOH Screenings   Food Insecurity: No Food Insecurity (05/13/2023)  Housing: Low Risk  (05/13/2023)  Transportation Needs: No Transportation Needs (05/13/2023)  Utilities: Not At Risk (05/13/2023)  Alcohol Screen: Low Risk  (09/03/2021)  Depression (PHQ2-9): Medium Risk (03/21/2021)  Tobacco Use: High Risk (04/17/2023)     Readmission Risk Interventions    10/14/2021    2:38 PM  Readmission Risk Prevention Plan  Transportation Screening Complete  PCP or Specialist Appt within 3-5 Days Complete  HRI or Home Care Consult Complete  Social Work Consult for Recovery Care Planning/Counseling Complete  Palliative Care Screening Not Applicable  Medication Review [employer]) Complete

## 2023-05-22 NOTE — Progress Notes (Signed)
PHARMACY - TOTAL PARENTERAL NUTRITION CONSULT NOTE  Indication: Gastrocolic fistula   Patient Measurements: Height: 6\' 3"  (190.5 cm) Weight: 54.8 kg (120 lb 13 oz) IBW/kg (Calculated) : 84.5 TPN AdjBW (KG): 53.2 Body mass index is 15.1 kg/m. 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  Patient presented on 7/19 with N/V x 4 days along with LLQ pain.  CT showed thickening of the distal transverse colon with complex fistulous connection between the transverse colon to the gastrojejunal anastomosis and adjacent small bowel loops.  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  Pharmacy consulted to manage TPN.  Patient was started on CLD on 7/22 and reported fullness/abdominal pain after eating.  He reports not eating for a week PTA.  He does not weigh himself, but he knows he has lost a lot of weight; his pants size reduced from a 36 to a 28 over a 6 month time frame.  He typically eats 2 meals a day (lunch and dinner) and he eats protein, vegetables and carb; he rarely eats fruits.   Glucose / Insulin: no hx DM . BG <180, off SSI 7/31 - significant hypoglycemia on cyclic TPN   8/1 - insulin removed from TPN and BG stabilized Electrolytes: K 4.0, Mg still 1.8 (max in TPN; received 2-4g IV almost daily since 8/8), CoCa 10.16 (none in TPN), Ch 97, down; others WNL Renal: SCr 0.49, BUN 31, furosemide IV 40 BID since 8/12  Hepatic: Alk phos/AST/ALT mildly elevated, Tbili /TG wnl, albumin 1.9 Intake / Output; MIVF: UOP 0.76 ml/kg/hr, LBM 8/21  GI Imaging:  7/25 CT: severe fatty liver, thickened appearance of colon 8/22 CT abd- mild diffuse gaseous distention small bowel to colon / no transition to decompressed small bowel- may reflect ileus. Contrast passed through small bowel to colon GI Surgeries / Procedures: none since TPN initiation  Central  access: PICC placed 04/21/23 TPN start date: 04/22/23  Nutritional Goals: Goal concentrated TPN rate is 75 mL/hr (provides 121g AA and 2232 kCal per day)    8/1 RD Estimated Needs Total Energy Estimated Needs: 2200-2400 kcals Total Protein Estimated Needs: 120-135 g Total Fluid Estimated Needs: 2L  Current Nutrition:  Clear liquids (for pleasure, poor absorption expected) - eating very little **Patient refusing some care TPN  Plan:  Continue cycled concentrated TPN over 16 hr (rate 57-113 ml/hr, GIR 3.14- 6.22 mg/kg/min), meeting 100% of estimated needs  *note cannot cycle down further due to GIR* Electrolytes in TPN: Na 150 mEq/L (max), K 5 mEq/L, Ca 0 mEq/L (removed 8/20), Mag 15 mEq/L (max), Phos 20 mmol/L, VO:ZDGU max Cl Add daily multivitamin and trace elements in TPN   Stop BG checks q12 hr Monitor diuresis plan (stable since 8/12)    Standard TPN labs 8/24, every Mon/Thurs and prn Per surgery, plan for GJ-colonic fistula repair when nutrition status improved    Avyaan Summer BS, PharmD, BCPS Clinical Pharmacist 05/22/2023 7:49 AM  Contact: [phone] after 3 PM  "Be curious, not judgmental..." -Debbora Dus

## 2023-05-22 NOTE — Progress Notes (Signed)
Report called to Nurse Trinna Post at [facility]. All questions answered. No further questions at this time.

## 2023-05-22 NOTE — Discharge Summary (Signed)
Physician Discharge Summary  Brent Rogers ZOX:[id number] DOB: [birth year] DOA: 04/17/2023  PCP: Marcine Matar, MD  Admit date: 04/17/2023 Discharge date: 05/22/2023  Admitted From: Home Disposition:  Home  Discharge Condition:Stable CODE STATUS:FULL Diet recommendation: Clear liquid diet + TPN  Brief/Interim Summary: Patient is a 57 year old male with history of chronic alcohol abuse, PUD, chronic pancreatitis, prior pancreatectomy, cholecystectomy who presented with abdominal pain.  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  General surgery consulted, plan made  for conservative management with TPN to improve nutritional status.  Might need repair at some point.  He was initially admitted under [facility] service, transferred to ICU due to worsening respiratory status.  Back to [facility] service on 8/18.   Continued to be on  TPN .  Palliative care, TOC were following. He needs LTAC because of ongoing  TPN .Patient was cleared by general surgery for discharge.  He will follow-up with outpatient general surgery, Dr. Donell Beers.  He has a bed at [facility] today.  Following problems were addressed during the hospitalization:  Acute hypoxic respiratory failure, recurrent aspiration pneumonia: Had to be transferred to ICU because of worsening respiratory status.  Currently on 4 L of oxygen.  Continue to wean if possible.  Lungs are mostly clear on auscultation.Last CXR showed stable interstitial and patchy ground-glass opacities throughout the lungs.  Started on short course of Augmentin.   Gastrocolic fistula: Presented with abdominal pain.  General surgery where consulted.  Recommended conservative management for now, TPN. General surgery planning for fistula repair at some point but he needs to be optimized medically, need to participate with physical therapy. Currently on clear liquid, TPN.   Abdomen is  soft and nontender.  Denies any abdomen pain today.  General surgery cleared for discharge and recommend  outpatient follow-up.  Last CT abdomen/pelvis imaging showed some degree of ileus but he has good bowel sounds and had a bowel movement yesterday   pancreatic mass status post Whipple's in 2015, severe protein calorie malnutrition: On TPN.   Leukocytosis:  Completed antibiotics course for pneumonia.  Urine culture growed Klebsiella.  Blood cultures have been negative so far.  Procalcitonin reassuring.  Started on short course of Augmentin for possible bilateral lower lobe pneumonia.  Continue to monitor as an outpatient   Constipation: Continue bowel regimen.    Electrolyte abnormalities : Continue to monitor and supplement as needed   Normocytic anemia: Likely history with chronic medical conditions.  Currently hemoglobin in the range of 7.  Continue to monitor   DVT of unknown duration: On therapeutic Lovenox   Bilateral foot wounds: Present on admission.  Continue supportive care, wound care following   Debility/deconditioning: TOC following.  At baseline, ambulates without a walker.  Difficult discharge planning to  [facility] because of TPN   Goals of care: Inpatient with multiple comorbidities, severe debility, on TPN.  Palliative care were following.  Current plan is to continue full scope of care.  We recommend follow-up with palliative care as an outpatient   Discharge Diagnoses:  Principal Problem:   Hyponatremia Active Problems:   Pancreatic insufficiency   HTN (hypertension)   GERD (gastroesophageal reflux disease)   History of partial pancreatectomy   Chronic pain syndrome   Iron deficiency anemia   Atherosclerosis of native coronary artery of native heart without angina pectoris   Major depressive disorder, recurrent episode, severe (HCC)   GAD (generalized anxiety disorder)   Gastric ulcer without hemorrhage or perforation   Transaminitis   Colitis  UTI (urinary tract infection)   Acute respiratory failure with hypoxia [facility])    Discharge Instructions  Discharge  Instructions     Diet clear liquid   Complete by: As directed    Clear liquid diet and TPN   Discharge instructions   Complete by: As directed    1)Please take your medications as instructed 2)Follow up with general surgery as an outpatient.  Name and number of the  provider has been attached   Discharge wound care:   Complete by: As directed    As per wound care nurse   Increase activity slowly   Complete by: As directed       Allergies as of 05/22/2023   No Known Allergies      Medication List     STOP taking these medications    dicyclomine 20 MG tablet Commonly known as: BENTYL   metoCLOPramide 10 MG tablet Commonly known as: Reglan   ondansetron 4 MG disintegrating tablet Commonly known as: ZOFRAN-ODT   ondansetron 4 MG tablet Commonly known as: Zofran Replaced by: ondansetron 4 MG/2ML Soln injection   promethazine 25 MG tablet Commonly known as: PHENERGAN       TAKE these medications    acetaminophen 500 MG tablet Commonly known as: TYLENOL Take 500 mg by mouth every 6 (six) hours as needed for moderate pain.   amoxicillin-clavulanate 875-125 MG tablet Commonly known as: AUGMENTIN Take 1 tablet by mouth every 12 (twelve) hours for 5 days.   enoxaparin 80 MG/0.8ML injection Commonly known as: LOVENOX Inject 0.8 mLs (80 mg total) into the skin every 12 (twelve) hours.   hydrocerin Crea Apply 1 Application topically daily. Start taking on: May 23, 2023   HYDROcodone-acetaminophen 7.5-325 mg/15 ml solution Commonly known as: HYCET Take 10 mLs by mouth every 4 (four) hours as needed for moderate pain.   lipase/protease/amylase 29562 UNITS Cpep capsule Commonly known as: CREON Take 1 capsule (36,000 Units total) by mouth with breakfast, with lunch, and with evening meal.   mirtazapine 15 MG disintegrating tablet Commonly known as: REMERON SOL-TAB Take 1 tablet (15 mg total) by mouth at bedtime.   ondansetron 4 MG/2ML Soln  injection Commonly known as: ZOFRAN Inject 2 mLs (4 mg total) into the vein every 8 (eight) hours as needed for nausea or vomiting. Replaces: ondansetron 4 MG tablet   pantoprazole 40 MG tablet Commonly known as: PROTONIX Take 1 tablet (40 mg total) by mouth 2 (two) times daily.   polyethylene glycol 17 g packet Commonly known as: MiraLax Take 17 g by mouth daily.   senna-docusate 8.6-50 MG tablet Commonly known as: Senokot-S Take 1 tablet by mouth at bedtime.               Discharge Care Instructions  (From admission, onward)           Start     Ordered   05/22/23 0000  Discharge wound care:       Comments: As per wound care nurse   05/22/23 1059            Follow-up Information     Almond Lint, MD. Go on 06/05/2023.   Specialty: General Surgery Why: Your appointment is on 9/6 at 9:45am (arrive 9:15am to check in and fill out paperwork) To follow up for surgical planning Contact information: [address] [place] [address] [phone]                No Known  Allergies  Consultations: PCCM, general surgery   Procedures/Studies: DG CHEST PORT 1 VIEW  Result Date: 05/22/2023 CLINICAL DATA:  200808 with hypoxia. EXAM: PORTABLE CHEST 1 VIEW COMPARISON:  Portable chest yesterday at 4:39 p.m. FINDINGS: 4:55 a.m. Interstitial and patchy ground-glass opacities throughout the lungs remain unchanged. No new or worsening abnormality. Small pleural effusions also unchanged. No pneumothorax is seen. The heart is slightly enlarged, with mild central vascular prominence. Right PICC again terminates in the distal SVC. The mediastinum is stable. There are no new osseous findings. There is a tangle of overlying monitor wires. IMPRESSION: No significant change. Stable interstitial and patchy ground-glass opacities throughout the lungs. Stable small pleural effusions. No new or worsening abnormality. Electronically Signed   By: Almira Bar  M.D.   On: 05/22/2023 05:14   CT ABDOMEN PELVIS W CONTRAST  Result Date: 05/21/2023 CLINICAL DATA:  Sepsis, hyponatremia EXAM: CT ABDOMEN AND PELVIS WITH CONTRAST TECHNIQUE: Multidetector CT imaging of the abdomen and pelvis was performed using the standard protocol following bolus administration of intravenous contrast. RADIATION DOSE REDUCTION: This exam was performed according to the departmental dose-optimization program which includes automated exposure control, adjustment of the mA and/or kV according to patient size and/or use of iterative reconstruction technique. CONTRAST:  75mL OMNIPAQUE IOHEXOL 350 MG/ML SOLN COMPARISON:  04/23/2023 FINDINGS: Lower chest: Small bilateral pleural effusions. Bronchiectasis and airspace disease in the lower lobes bilaterally. Heart is normal size. Hepatobiliary: Pneumobilia. Prior cholecystectomy. No focal hepatic abnormality. Pancreas: No focal abnormality or ductal dilatation. Spleen: No focal abnormality.  Normal size. Adrenals/Urinary Tract: Small subcentimeter cysts in the kidneys bilaterally. No hydronephrosis. Urinary bladder unremarkable. Stomach/Bowel: Mild diffuse distention of small bowel. No transition to decompressed distal small bowel. Oral contrast material has passed through the small-bowel and into the colon. This may reflect mild ileus. Stomach unremarkable. Vascular/Lymphatic: Aortic atherosclerosis. No evidence of aneurysm or adenopathy. Reproductive: No visible focal abnormality. Other: No free fluid or free air. Musculoskeletal: No acute bony abnormality. IMPRESSION: Mild diffuse gaseous distention of small bowel to the colon. No transition to decompressed small bowel. This may reflect ileus. Contrast has passed through the small bowel and the colon. Aortic atherosclerosis. Small bilateral pleural effusions. Bilateral lower lobe bronchiectasis and airspace disease could reflect pneumonia. Electronically Signed   By: Charlett Nose M.D.   On:  05/21/2023 22:33   DG CHEST PORT 1 VIEW  Result Date: 05/21/2023 CLINICAL DATA:  Hypoxia EXAM: PORTABLE CHEST 1 VIEW COMPARISON:  05/16/2023, 05/11/2023 FINDINGS: Right upper extremity central venous catheter tip over the SVC. Heterogeneous bilateral ground-glass opacities slowly improving compared to prior serial radiographs. Possible small right apical pneumothorax. IMPRESSION: Slowly improving bilateral ground-glass edema or pneumonia. Trace pleural effusions. Possible small right apical pneumothorax. Attention on radiographic follow-up. These results will be called to the ordering clinician or representative by the Radiologist Assistant, and communication documented in the PACS or [employer]. Electronically Signed   By: Jasmine Pang M.D.   On: 05/21/2023 20:31   DG CHEST PORT 1 VIEW  Result Date: 05/16/2023 CLINICAL DATA:  Shortness of breath EXAM: PORTABLE CHEST 1 VIEW COMPARISON:  05/11/2023 FINDINGS: Right-sided PICC line with the tip projecting over the SVC. Bilateral interstitial thickening most severe at the lung bases bilaterally. No focal consolidation. No pleural effusion or pneumothorax. Mild stable cardiomegaly. No acute osseous abnormality. IMPRESSION: 1. Cardiomegaly with bilateral interstitial thickening and patchy alveolar opacities at the lung bases. Differential considerations include pulmonary edema versus multilobar pneumonia. Electronically Signed  By: Elige Ko M.D.   On: 05/16/2023 10:39   DG CHEST PORT 1 VIEW  Result Date: 05/11/2023 CLINICAL DATA:  Respiratory failure EXAM: PORTABLE CHEST 1 VIEW COMPARISON:  05/08/2023 FINDINGS: Cardiomegaly. Unchanged diffuse interstitial opacity and probable small layering pleural effusions. No new or focal airspace opacity. Right upper extremity PICC in unchanged position, tip near the superior cavoatrial junction. Overlying defibrillator pads and leads. Osseous structures unremarkable. IMPRESSION: 1. Cardiomegaly with unchanged  diffuse interstitial opacity and probable small layering pleural effusions, consistent with edema or atypical/viral infection. No new or focal airspace opacity. 2. Right upper extremity PICC in unchanged position, tip near the superior cavoatrial junction. Electronically Signed   By: Jearld Lesch M.D.   On: 05/11/2023 11:56   DG CHEST PORT 1 VIEW  Result Date: 05/08/2023 CLINICAL DATA:  Acute hypercapnic respiratory failure. EXAM: PORTABLE CHEST 1 VIEW COMPARISON:  Radiographs 05/03/2023 and 04/29/2023.  CT 03/08/2021. FINDINGS: 0851 hours. Right arm PICC is unchanged, extending to the mid SVC level. The heart size and mediastinal contours are stable. Diffuse bilateral airspace opacities are unchanged. Small left pleural effusion appears slightly smaller. No evidence of pneumothorax. The bones appear unchanged. Telemetry leads overlie the chest. IMPRESSION: No significant change in diffuse bilateral airspace opacities. Small left pleural effusion appears slightly smaller. Electronically Signed   By: Carey Bullocks M.D.   On: 05/08/2023 12:44   ECHOCARDIOGRAM COMPLETE  Result Date: 05/07/2023    ECHOCARDIOGRAM REPORT   Patient Name:   Brent Rogers Date of Exam: 05/07/2023 Medical Rec #:  [id number]        Height:       75.0 in Accession #:    [id number]       Weight:       132.7 lb Date of Birth:  July 24, 1966         BSA:          1.843 m Patient Age:    57 years         BP:           133/96 mmHg Patient Gender: M                HR:           98 bpm. Exam Location:  Inpatient Procedure: 2D Echo, Color Doppler and Cardiac Doppler Indications:    CHF  History:        Patient has prior history of Echocardiogram examinations, most                 recent 03/07/2021. CAD; Risk Factors:Hypertension.  Sonographer:    Milbert Coulter Referring Phys: [id number] SUDHAM CHAND IMPRESSIONS  1. Left ventricular ejection fraction, by estimation, is 55%. The left ventricle has normal function. The left ventricle has no regional wall  motion abnormalities. Indeterminate diastolic filling due to E-A fusion.  2. Right ventricular systolic function is normal. The right ventricular size is normal. There is mildly elevated pulmonary artery systolic pressure. The estimated right ventricular systolic pressure is 43.1 mmHg.  3. Left atrial size was mildly dilated.  4. Right atrial size was mildly dilated.  5. The mitral valve is normal in structure. Mild mitral valve regurgitation. No evidence of mitral stenosis.  6. The aortic valve is tricuspid. There is mild calcification of the aortic valve. There is mild thickening of the aortic valve. Aortic valve regurgitation is not visualized. No aortic stenosis is present.  7. The inferior vena cava is dilated in size  with <50% respiratory variability, suggesting right atrial pressure of 15 mmHg. FINDINGS  Left Ventricle: Left ventricular ejection fraction, by estimation, is 55%. The left ventricle has normal function. The left ventricle has no regional wall motion abnormalities. The left ventricular internal cavity size was normal in size. There is no left ventricular hypertrophy. Indeterminate diastolic filling due to E-A fusion. Right Ventricle: The right ventricular size is normal. No increase in right ventricular wall thickness. Right ventricular systolic function is normal. There is mildly elevated pulmonary artery systolic pressure. The tricuspid regurgitant velocity is 2.65  m/s, and with an assumed right atrial pressure of 15 mmHg, the estimated right ventricular systolic pressure is 43.1 mmHg. Left Atrium: Left atrial size was mildly dilated. Right Atrium: Right atrial size was mildly dilated. Pericardium: There is no evidence of pericardial effusion. Mitral Valve: The mitral valve is normal in structure. Mild mitral valve regurgitation. No evidence of mitral valve stenosis. Tricuspid Valve: The tricuspid valve is normal in structure. Tricuspid valve regurgitation is mild . No evidence of tricuspid  stenosis. Aortic Valve: The aortic valve is tricuspid. There is mild calcification of the aortic valve. There is mild thickening of the aortic valve. Aortic valve regurgitation is not visualized. No aortic stenosis is present. Aortic valve mean gradient measures 4.0 mmHg. Aortic valve peak gradient measures 7.1 mmHg. Aortic valve area, by VTI measures 3.36 cm. Pulmonic Valve: The pulmonic valve was normal in structure. Pulmonic valve regurgitation is trivial. No evidence of pulmonic stenosis. Aorta: The aortic root is normal in size and structure. Venous: The inferior vena cava is dilated in size with less than 50% respiratory variability, suggesting right atrial pressure of 15 mmHg. IAS/Shunts: The interatrial septum was not well visualized.  LEFT VENTRICLE PLAX 2D LVIDd:         4.30 cm   Diastology LVIDs:         2.70 cm   LV e' medial:    9.36 cm/s LV PW:         0.90 cm   LV E/e' medial:  8.0 LV IVS:        1.00 cm   LV e' lateral:   12.70 cm/s LVOT diam:     2.40 cm   LV E/e' lateral: 5.9 LV SV:         79 LV SV Index:   43 LVOT Area:     4.52 cm  RIGHT VENTRICLE RV Basal diam:  3.80 cm RV Mid diam:    3.20 cm RV S prime:     15.80 cm/s TAPSE (M-mode): 1.7 cm LEFT ATRIUM             Index        RIGHT ATRIUM           Index LA diam:        3.60 cm 1.95 cm/m   RA Area:     19.60 cm LA Vol (A2C):   71.4 ml 38.73 ml/m  RA Volume:   60.10 ml  32.60 ml/m LA Vol (A4C):   44.2 ml 23.98 ml/m LA Biplane Vol: 56.8 ml 30.81 ml/m  AORTIC VALVE AV Area (Vmax):    3.47 cm AV Area (Vmean):   3.26 cm AV Area (VTI):     3.36 cm AV Vmax:           133.00 cm/s AV Vmean:          94.600 cm/s AV VTI:  0.234 m AV Peak Grad:      7.1 mmHg AV Mean Grad:      4.0 mmHg LVOT Vmax:         102.00 cm/s LVOT Vmean:        68.100 cm/s LVOT VTI:          0.174 m LVOT/AV VTI ratio: 0.74  AORTA Ao Root diam: 3.40 cm Ao Asc diam:  3.30 cm MITRAL VALVE               TRICUSPID VALVE MV Area (PHT): 4.06 cm    TR Peak grad:    28.1 mmHg MV Decel Time: 187 msec    TR Vmax:        265.00 cm/s MV E velocity: 74.90 cm/s MV A velocity: 82.30 cm/s  SHUNTS MV E/A ratio:  0.91        Systemic VTI:  0.17 m                            Systemic Diam: 2.40 cm Weston Brass MD Electronically signed by Weston Brass MD Signature Date/Time: 05/07/2023/2:09:07 PM    Final    DG CHEST PORT 1 VIEW  Result Date: 05/03/2023 CLINICAL DATA:  Acute hypercapnic respiratory failure EXAM: PORTABLE CHEST 1 VIEW COMPARISON:  04/29/2023.  04/28/2023.  04/25/2023. FINDINGS: Endotracheal tube and nasogastric tube have been removed. Right arm PICC remains in place with the tip in the SVC above the right atrium. Lung volumes are slightly decreased following extubation. Bilateral interstitial and alveolar edema/airspace filling persists. Small amount of pleural fluid persists, left more than right. No new finding otherwise. IMPRESSION: Endotracheal tube and nasogastric tube removed. Slightly decreased lung volumes following extubation. Persistent edema/airspace filling and small effusions, left more than right. Electronically Signed   By: Paulina Fusi M.D.   On: 05/03/2023 10:06   DG CHEST PORT 1 VIEW  Result Date: 04/29/2023 CLINICAL DATA:  141880 SOB (shortness of breath) 141880 EXAM: PORTABLE CHEST - 1 VIEW COMPARISON:  Earlier film of the same day FINDINGS: Endotracheal tube has been advanced, tip 6.6 cm above carina. Gastric tube and right arm PICC stable. Slight improvement in the bilateral interstitial edema or infiltrates. Heart size and mediastinal contours are within normal limits. Small left pleural effusion. Visualized bones unremarkable. IMPRESSION: 1. Slight improvement in bilateral interstitial edema or infiltrates. 2. Support hardware positioned as above. Electronically Signed   By: Corlis Leak M.D.   On: 04/29/2023 10:18   DG CHEST PORT 1 VIEW  Result Date: 04/29/2023 CLINICAL DATA:  Endotracheal tube. EXAM: PORTABLE CHEST 1 VIEW COMPARISON:   04/28/2023. FINDINGS: The heart size and mediastinal contours are stable. The pulmonary vasculature is distended. Diffuse airspace opacities are noted bilaterally and slightly increased from the prior exam. There is a small right pleural effusion. No pneumothorax. A right PICC line terminates over the superior vena cava. An enteric tube terminates in the stomach. An endotracheal tube terminates 7.7 cm above the carina. IMPRESSION: 1. Pulmonary vascular congestion. 2. Interval worsening of patchy airspace opacities in the lungs bilaterally. 3. Small right pleural effusion. 4. Support apparatus as described above. Electronically Signed   By: Thornell Sartorius M.D.   On: 04/29/2023 01:45   DG CHEST PORT 1 VIEW  Result Date: 04/28/2023 CLINICAL DATA:  Adult respiratory distress syndrome EXAM: PORTABLE CHEST 1 VIEW COMPARISON:  04/25/2023 FINDINGS: Endotracheal tube tip 7 cm above the carina. Orogastric or nasogastric tube  enters the abdomen. Right arm PICC tip in the SVC above the right atrium. Slight decrease in diffuse airspace filling consistent with improving ARDS. No dense consolidation. No visible pleural effusion. IMPRESSION: Slight decrease in diffuse airspace filling consistent with improving ARDS. Electronically Signed   By: Paulina Fusi M.D.   On: 04/28/2023 10:02   DG Chest Port 1 View  Result Date: 04/25/2023 CLINICAL DATA:  Status post intubation. EXAM: PORTABLE CHEST 1 VIEW COMPARISON:  April 25, 2023 (1:45 a.m.) FINDINGS: There is stable right-sided PICC line positioning. Since the prior study there is been interval placement of an endotracheal tube with its distal tip approximately 4.3 cm from the carina. Interval enteric tube placement is also seen with its distal end extending below the level of the diaphragm. The heart size and mediastinal contours are within normal limits. Stable, marked severity bilateral infiltrates are seen. No pleural effusion or pneumothorax is identified. The visualized  skeletal structures are unremarkable. IMPRESSION: 1. Interval endotracheal and enteric tube placement, as described above. 2. Stable, marked severity bilateral infiltrates. Electronically Signed   By: Aram Candela M.D.   On: 04/25/2023 03:43   DG CHEST PORT 1 VIEW  Result Date: 04/25/2023 CLINICAL DATA:  Acute respiratory failure EXAM: PORTABLE CHEST 1 VIEW COMPARISON:  04/24/2023, 04/23/2023 FINDINGS: Right upper extremity central venous catheter tip over the SVC. Widespread heterogeneous airspace disease worse in the interim. Stable cardiomediastinal silhouette. IMPRESSION: Interval worsening of widespread heterogeneous airspace disease. Electronically Signed   By: Jasmine Pang M.D.   On: 04/25/2023 02:02   VAS [place] LOWER EXTREMITY VENOUS (DVT)  Result Date: 04/24/2023  Lower Venous DVT Study Patient Name:  ISAO GRANADO  Date of Exam:   04/24/2023 Medical Rec #: [id number]         Accession #:    [id number] Date of Birth: [birth year]          Patient Gender: M Patient Age:   66 years Exam Location:  [facility] Procedure:      VAS [place] LOWER EXTREMITY VENOUS (DVT) Referring Phys: Levon Hedger --------------------------------------------------------------------------------  Indications: Edema.  Risk Factors: None identified. Comparison Study: Intimal thickening/partial thrombosis developed since previous                   exam 04/01/21 Performing Technologist: Shona Simpson  Examination Guidelines: A complete evaluation includes B-mode imaging, spectral Doppler, color Doppler, and power Doppler as needed of all accessible portions of each vessel. Bilateral testing is considered an integral part of a complete examination. Limited examinations for reoccurring indications may be performed as noted. The reflux portion of the exam is performed with the patient in reverse Trendelenburg.  +--------+---------------+---------+-----------+--------------+---------------+ RIGHT    CompressibilityPhasicitySpontaneityProperties    Thrombus Aging  +--------+---------------+---------+-----------+--------------+---------------+ CFV     Partial        Yes      Yes        softly        Age                                                        echogenic     Indeterminate   +--------+---------------+---------+-----------+--------------+---------------+ SFJ     Full                                                             +--------+---------------+---------+-----------+--------------+---------------+  FV Prox Full                                                             +--------+---------------+---------+-----------+--------------+---------------+ FV Mid  Full                                                             +--------+---------------+---------+-----------+--------------+---------------+ FV      Full                                                             Distal                                                                   +--------+---------------+---------+-----------+--------------+---------------+ PFV     Full                                                             +--------+---------------+---------+-----------+--------------+---------------+ POP     Full           Yes      Yes                                      +--------+---------------+---------+-----------+--------------+---------------+ PTV     Full                                                             +--------+---------------+---------+-----------+--------------+---------------+ PERO    Full                                                             +--------+---------------+---------+-----------+--------------+---------------+   +---------+---------------+---------+-----------+----------+--------------+ LEFT     CompressibilityPhasicitySpontaneityPropertiesThrombus Aging  +---------+---------------+---------+-----------+----------+--------------+ CFV      Full           Yes      Yes                                 +---------+---------------+---------+-----------+----------+--------------+ SFJ  Full                                                        +---------+---------------+---------+-----------+----------+--------------+ FV Prox  Full                                                        +---------+---------------+---------+-----------+----------+--------------+ FV Mid   Full                                                        +---------+---------------+---------+-----------+----------+--------------+ FV DistalFull                                                        +---------+---------------+---------+-----------+----------+--------------+ PFV      Full                                                        +---------+---------------+---------+-----------+----------+--------------+ POP      Full           Yes      Yes                                 +---------+---------------+---------+-----------+----------+--------------+ PTV      Full                                                        +---------+---------------+---------+-----------+----------+--------------+ PERO     Full                                                        +---------+---------------+---------+-----------+----------+--------------+     Summary: RIGHT: - Findings consistent with age indeterminate deep vein thrombosis involving the right common femoral vein. - No cystic structure found in the popliteal fossa.  LEFT: - There is no evidence of deep vein thrombosis in the lower extremity.  - No cystic structure found in the popliteal fossa.  *See table(s) above for measurements and observations. Electronically signed by Waverly Ferrari MD on 04/24/2023 at 4:20:28 PM.    Final    DG Chest Port 1 View  Result Date:  04/24/2023 CLINICAL DATA:  Status post thoracentesis EXAM: PORTABLE CHEST 1 VIEW COMPARISON:  04/23/2019 FINDINGS: Cardiac shadow is within normal limits. Right PICC is again seen and stable.  Patchy airspace opacities are again identified slightly worse on the left when compared with the prior exam. No pneumothorax is noted following thoracentesis. No sizable effusion is seen. IMPRESSION: No pneumothorax following right-sided thoracentesis. Electronically Signed   By: Alcide Clever M.D.   On: 04/24/2023 01:45   CT ABDOMEN PELVIS W CONTRAST  Result Date: 04/23/2023 CLINICAL DATA:  Sepsis abdominal pain. History of Whipple procedure. EXAM: CT ABDOMEN AND PELVIS WITH CONTRAST TECHNIQUE: Multidetector CT imaging of the abdomen and pelvis was performed using the standard protocol following bolus administration of intravenous contrast. RADIATION DOSE REDUCTION: This exam was performed according to the departmental dose-optimization program which includes automated exposure control, adjustment of the mA and/or kV according to patient size and/or use of iterative reconstruction technique. CONTRAST:  OMNIPAQUE IOHEXOL 300 MG/ML  SOLN COMPARISON:  CT abdomen pelvis dated 04/17/2023. FINDINGS: Lower chest: Partially visualized moderate bilateral pleural effusions with partial compressive atelectasis of the lower lobes, new since the prior CT. Diffuse patchy airspace density throughout the visualized lungs new since the prior CT. Findings may represent edema but concerning for pneumonia. Clinical correlation is recommended. There is coronary vascular calcification. No intra-abdominal free air.  Small ascites. Hepatobiliary: Severe fatty liver. Mild pneumobilia. Cholecystectomy. Pancreas: Postsurgical changes of Whipple involving the head of the pancreas. The body and tail of the pancreas appear unremarkable. Spleen: The spleen is unremarkable. Adrenals/Urinary Tract: The adrenal glands are unremarkable. Small bilateral  renal cysts. There is no hydronephrosis on either side. There is symmetric enhancement and excretion of contrast by both kidneys. The urinary bladder is grossly unremarkable. Stomach/Bowel: Postsurgical changes of the bowel related to Whipple surgery. There is diffuse thickened appearance of the colon and thickened appearance of the terminal ileum. Findings may be related to ascites or represent enterocolitis. Clinical correlation is recommended. There is no bowel obstruction. The appendix is normal. Vascular/Lymphatic: Moderate aortoiliac atherosclerotic disease. The IVC is unremarkable. No portal venous gas. There is no adenopathy. Reproductive: Ascites, diffuse mesenteric and subcutaneous edema and anasarca. Other: None Musculoskeletal: Osteopenia with degenerative changes of the spine. No acute osseous pathology. IMPRESSION: 1. Partially visualized moderate bilateral pleural effusions with partial compressive atelectasis of the lower lobes, new since the prior CT. 2. Diffuse patchy airspace density throughout the visualized lungs new since the prior CT. Findings may represent edema but concerning for pneumonia. 3. Severe fatty liver. 4. Postsurgical changes of Whipple surgery. 5. Diffuse thickened appearance of the colon and terminal ileum may be related to ascites or represent enterocolitis. No bowel obstruction. Normal appendix. 6. Severe anasarca, new since the prior CT. 7.  Aortic Atherosclerosis (ICD10-I70.0). Electronically Signed   By: Elgie Collard M.D.   On: 04/23/2023 22:39   DG CHEST PORT 1 VIEW  Result Date: 04/23/2023 CLINICAL DATA:  Shortness of breath EXAM: PORTABLE CHEST 1 VIEW COMPARISON:  03/04/2023 FINDINGS: Cardiac shadow is stable. Right PICC is seen in satisfactory position. Diffuse bilateral airspace opacities are noted with both a alveolar and interstitial component. These changes may represent significant underlying edema although diffuse bilateral pneumonia deserves consideration  as well. Small effusions are noted bilaterally. IMPRESSION: Diffuse bilateral airspace opacities likely representing edema/inflammation. Electronically Signed   By: Alcide Clever M.D.   On: 04/23/2023 18:24      Subjective: Patient seen and examined at bedside today.  Hemodynamically stable.  In mild sinus tachycardia.  On 4 L of oxygen.  Denies any abdominal pain today.  Had a bowel movement yesterday.  Denies any worsening  shortness of breath or cough.  I called his brother Brent Rogers to discuss about discharge planning, call not received  Discharge Exam: Vitals:   05/22/23 0502 05/22/23 0700  BP: (!) 139/90 127/82  Pulse:  (!) 103  Resp:  19  Temp:  97.6 F (36.4 C)  SpO2: 98% 99%   Vitals:   05/21/23 2330 05/22/23 0321 05/22/23 0502 05/22/23 0700  BP:   (!) 139/90 127/82  Pulse: 99 (!) 101  (!) 103  Resp: 19 19  19   Temp:  99.7 F (37.6 C)  97.6 F (36.4 C)  TempSrc:  Axillary  Oral  SpO2: 98% 99% 98% 99%  Weight:   54.8 kg   Height:        General: Pt is alert, awake, not in acute distress, very deconditioned, weak, lying in bed Cardiovascular: RRR, sinus tach, no rubs, no gallops Respiratory: Diminished air sounds on bases, no wheezing, no rhonchi Abdominal: Soft, mildly distended, ND, bowel sounds + Extremities: no edema, no cyanosis    The results of significant diagnostics from this hospitalization (including imaging, microbiology, ancillary and laboratory) are listed below for reference.     Microbiology: No results found for this or any previous visit (from the past 240 hour(s)).   Labs: BNP (last 3 results) Recent Labs    06/04/22 2223 03/04/23 2118 04/24/23 0858  BNP 32.3 28.9 571.3*   Basic Metabolic Panel: Recent Labs  Lab 05/16/23 0754 05/17/23 0355 05/18/23 0613 05/19/23 0500 05/20/23 0530 05/21/23 0450 05/22/23 0339  NA 134*  --  137 137 135 139 136  K 4.6  --  4.9 5.2* 4.6 4.8 4.0  CL 95*  --  100 101 100 100 97*  CO2 31  --  29  31 29  32 33*  GLUCOSE 146*  --  139* 144* 154* 145* 160*  BUN 26*  --  27* 35* 33* 31* 19  CREATININE 0.63  --  0.66 0.45* 0.41* 0.49* 0.40*  CALCIUM 8.9  --  8.8* 8.9 8.6* 8.7* 8.4*  MG 1.8 1.8 1.8  --  1.7 1.8  --   PHOS 3.8  --  3.7  --   --  3.3  --    Liver Function Tests: Recent Labs  Lab 05/18/23 0613 05/19/23 0500 05/20/23 0530 05/21/23 0450 05/22/23 0339  AST 30 31 26  66* 30  ALT 37 37 32 60* 41  ALKPHOS 127* 121 123 138* 129*  BILITOT 0.3 0.2* 0.3 0.4 0.4  PROT 6.6 6.6 6.2* 6.2* 5.7*  ALBUMIN 2.1* 1.9* 1.9* 1.9* 1.8*   No results for input(s): "LIPASE", "AMYLASE" in the last 168 hours. No results for input(s): "AMMONIA" in the last 168 hours. CBC: Recent Labs  Lab 05/18/23 0614 05/20/23 0530 05/21/23 0450 05/22/23 0339  WBC 25.9* 23.1* 27.9* 21.3*  HGB 8.1* 7.8* 7.7* 7.8*  HCT 27.0* 26.3* 25.6* 25.7*  MCV 98.2 98.1 95.2 96.3  PLT 616* 574* 584* 557*   Cardiac Enzymes: No results for input(s): "CKTOTAL", "CKMB", "CKMBINDEX", "TROPONINI" in the last 168 hours. BNP: Invalid input(s): "POCBNP" CBG: Recent Labs  Lab 05/19/23 0614 05/20/23 0629 05/20/23 1629 05/21/23 0630 05/22/23 0614  GLUCAP 169* 165* 109* 165* 179*   D-Dimer No results for input(s): "DDIMER" in the last 72 hours. Hgb A1c No results for input(s): "HGBA1C" in the last 72 hours. Lipid Profile No results for input(s): "CHOL", "HDL", "LDLCALC", "TRIG", "CHOLHDL", "LDLDIRECT" in the last 72 hours. Thyroid function studies No results for input(s): "  TSH", "T4TOTAL", "T3FREE", "THYROIDAB" in the last 72 hours.  Invalid input(s): "FREET3" Anemia work up No results for input(s): "VITAMINB12", "FOLATE", "FERRITIN", "TIBC", "IRON", "RETICCTPCT" in the last 72 hours. Urinalysis    Component Value Date/Time   COLORURINE YELLOW 04/17/2023 1518   APPEARANCEUR HAZY (A) 04/17/2023 1518   LABSPEC 1.008 04/17/2023 1518   PHURINE 5.0 04/17/2023 1518   GLUCOSEU NEGATIVE 04/17/2023 1518   HGBUR  SMALL (A) 04/17/2023 1518   BILIRUBINUR NEGATIVE 04/17/2023 1518   KETONESUR 5 (A) 04/17/2023 1518   PROTEINUR NEGATIVE 04/17/2023 1518   UROBILINOGEN 1.0 06/23/2015 1513   NITRITE NEGATIVE 04/17/2023 1518   LEUKOCYTESUR LARGE (A) 04/17/2023 1518   Sepsis Labs Recent Labs  Lab 05/18/23 0614 05/20/23 0530 05/21/23 0450 05/22/23 0339  WBC 25.9* 23.1* 27.9* 21.3*   Microbiology No results found for this or any previous visit (from the past 240 hour(s)).  Please note: You were cared for by a hospitalist during your hospital stay. Once you are discharged, your primary care physician will handle any further medical issues. Please note that NO REFILLS for any discharge medications will be authorized once you are discharged, as it is imperative that you return to your primary care physician (or establish a relationship with a primary care physician if you do not have one) for your post hospital discharge needs so that they can reassess your need for medications and monitor your lab values.    Time coordinating discharge: 40 minutes  SIGNED:   Burnadette Pop, MD  [facility] 05/22/2023, 10:59 AM Pager [phone]  If 7PM-7AM, please contact night-coverage [email or web address] Password [email or web address]

## 2023-05-25 DIAGNOSIS — E43 Unspecified severe protein-calorie malnutrition: Secondary | ICD-10-CM | POA: Diagnosis not present

## 2023-05-25 DIAGNOSIS — E871 Hypo-osmolality and hyponatremia: Secondary | ICD-10-CM | POA: Diagnosis not present

## 2023-05-25 DIAGNOSIS — K632 Fistula of intestine: Secondary | ICD-10-CM | POA: Diagnosis not present

## 2023-05-30 DIAGNOSIS — I471 Supraventricular tachycardia, unspecified: Secondary | ICD-10-CM

## 2023-06-30 DEATH — deceased

## 2023-07-16 IMAGING — CT CT HEAD W/O CM
3 series · 16 of 47 positions shown, 19 images · non-contrast
Comparison: Head CT dated 12/25/2003.

CLINICAL DATA: Erect.



[Series 2: head wo · axial · 0.42mm/px · z∈[+1376,+1506]mm · 10 of 32 slices shown, 13 images]
[im 3/32  brain]
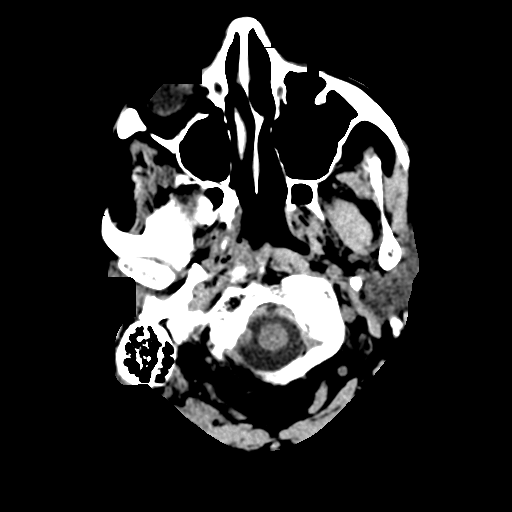
[im 3/32  bone]
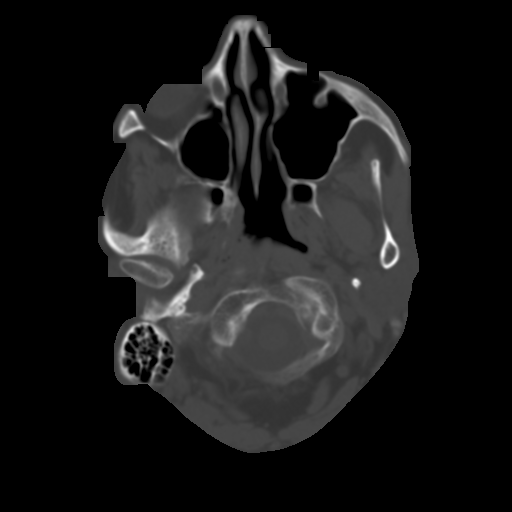
[im 6/32  brain]
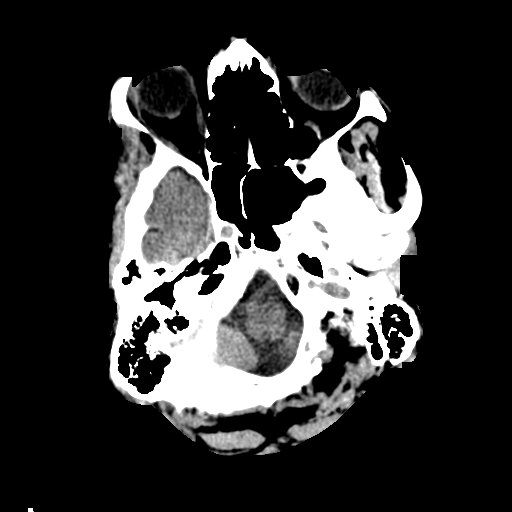
[im 9/32  brain]
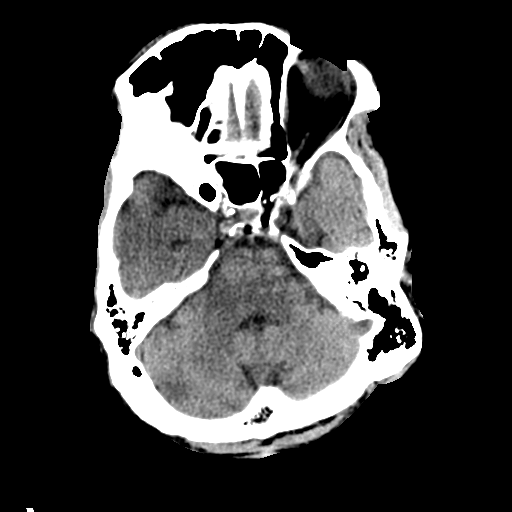
[im 11/32  brain]
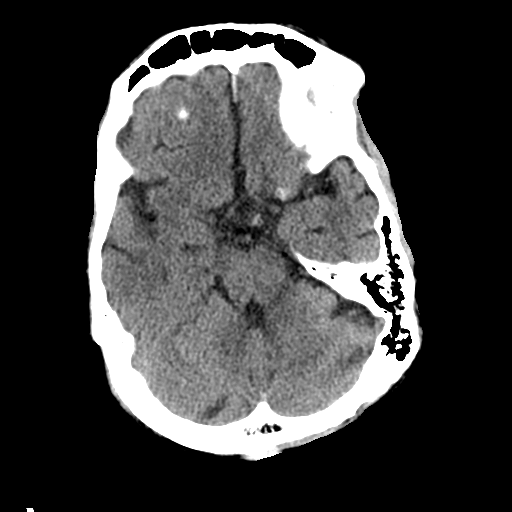
[im 14/32  brain]
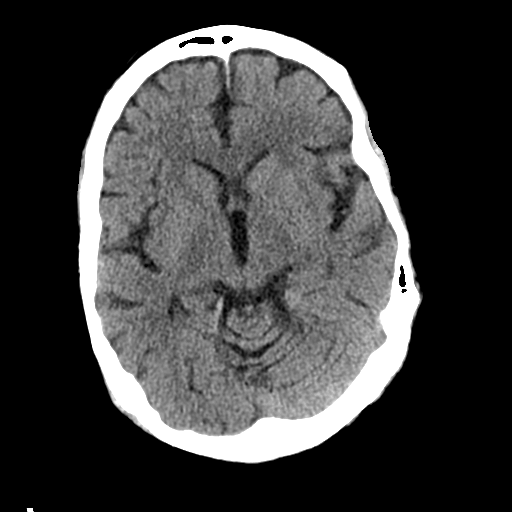
[im 14/32  bone]
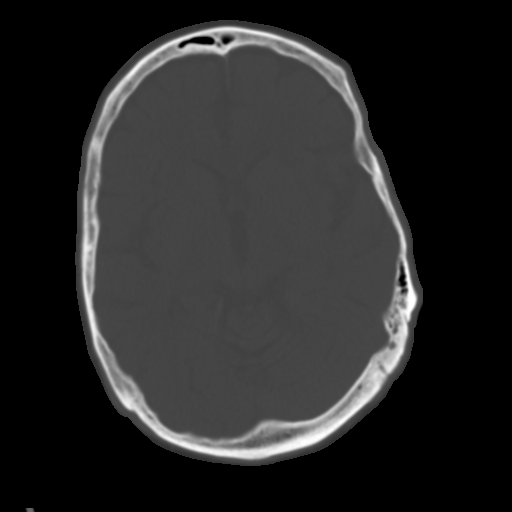
[im 18/32  brain]
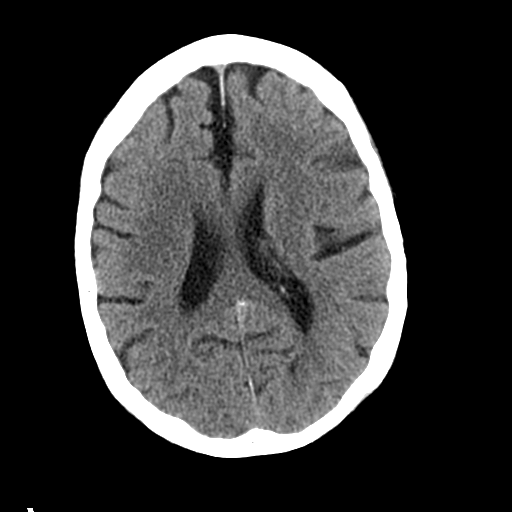
[im 21/32  brain]
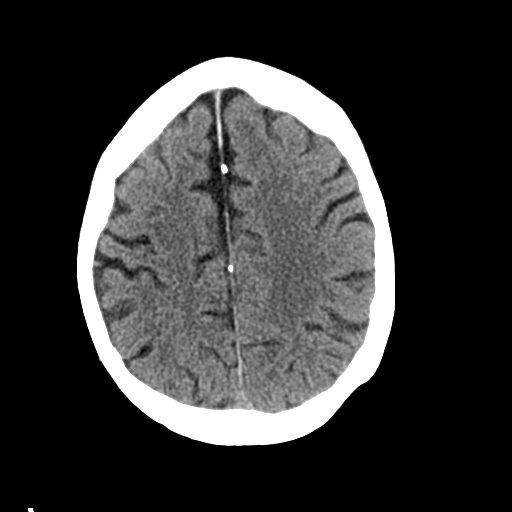
[im 24/32  brain]
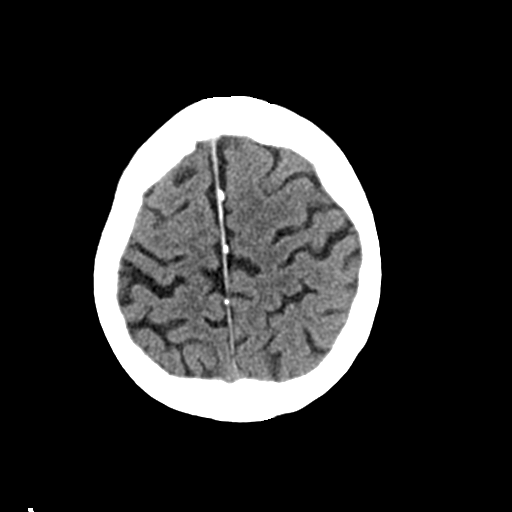
[im 26/32  brain]
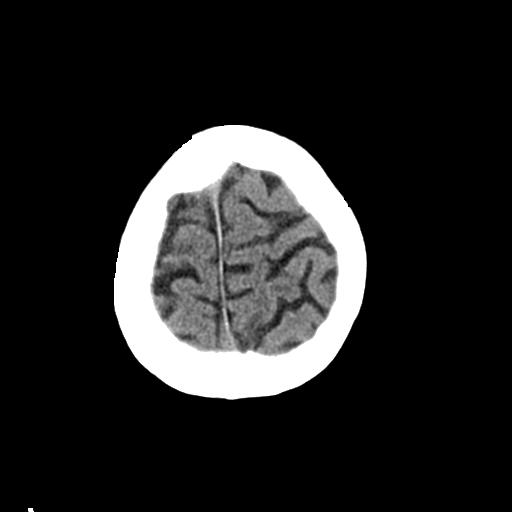
[im 26/32  bone]
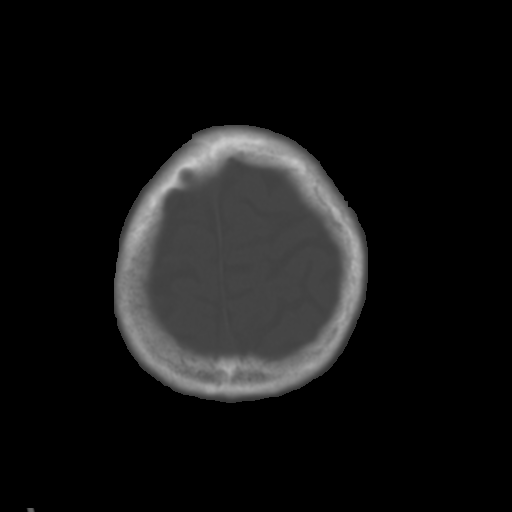
[im 29/32  brain]
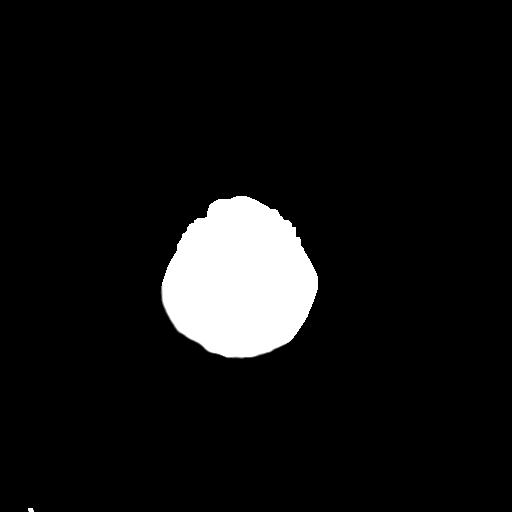

[Series 5: coronal soft tissue · coronal · 0.34mm/px · 3 of 69 slices shown]
[im 23/69  brain]
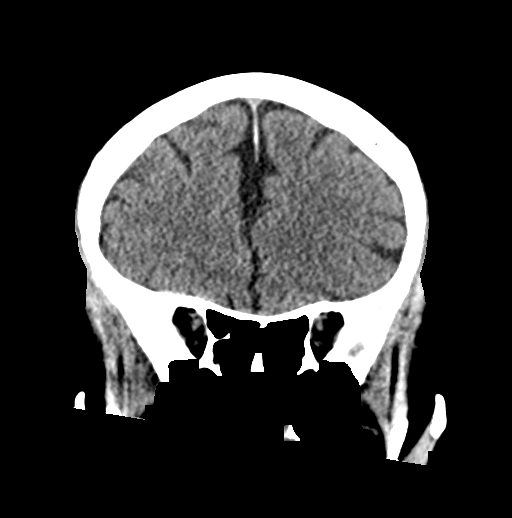
[im 31/69  brain]
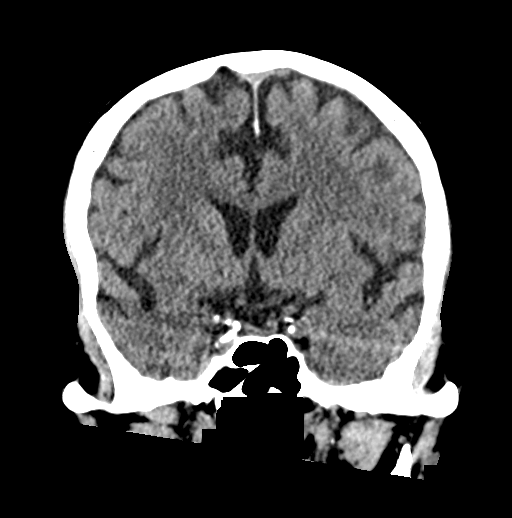
[im 38/69  brain]
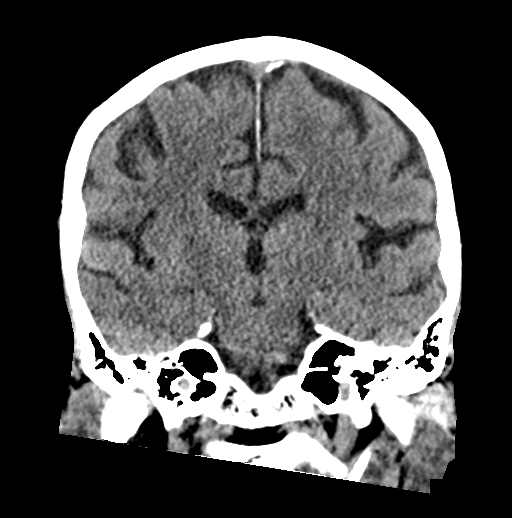

[Series 6: sagittal soft tissue · sagittal · 0.34mm/px · 3 of 56 slices shown]
[im 21/56  brain]
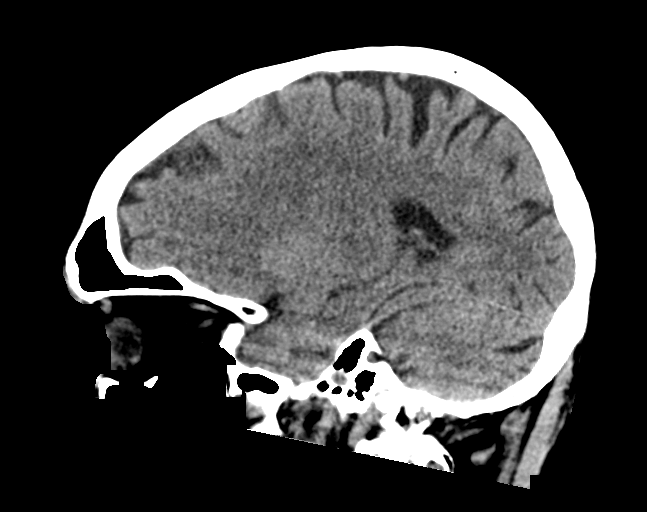
[im 28/56  brain]
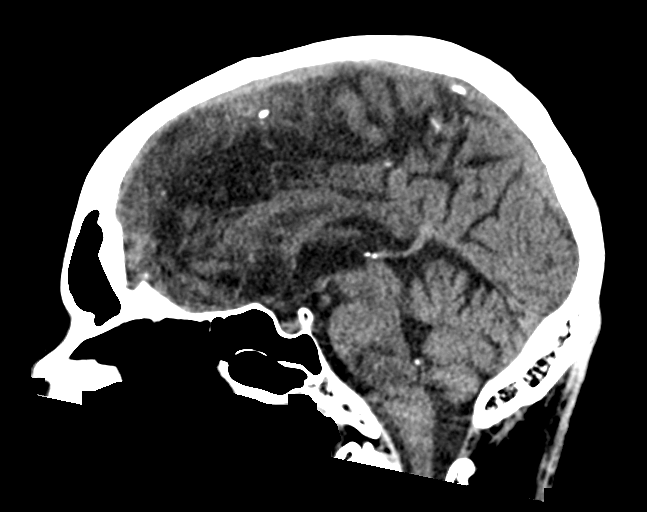
[im 35/56  brain]
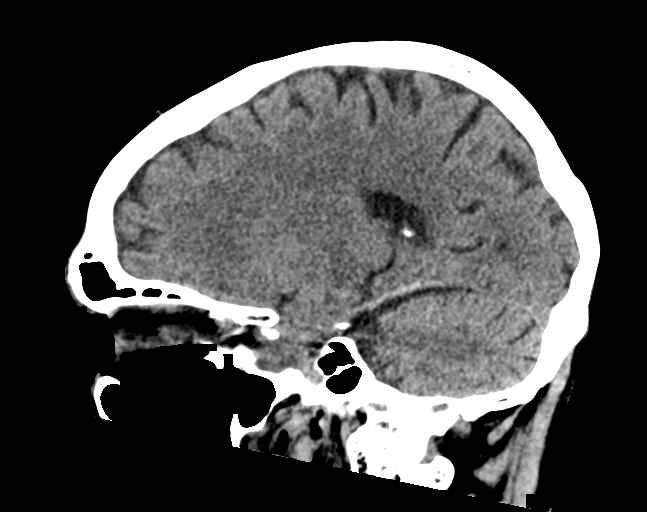

[16 of 47 positions shown; findings below may reference images not displayed]

FINDINGS: Brain: The ventricles and sulci are appropriate size for the
patient's age. The gray-white matter discrimination is preserved.
There is no acute intracranial hemorrhage. No mass effect or midline
shift. No extra-axial fluid collection.

Vascular: No hyperdense vessel or unexpected calcification.

Skull: Normal. Negative for fracture or focal lesion.

Sinuses/Orbits: No acute finding.

Other: None
IMPRESSION: No acute intracranial pathology.
